# Patient Record
Sex: Male | Born: 1950
Health system: Southern US, Community
[De-identification: ages and names within clinical notes are randomized; demographics above are authoritative.]

## PROBLEM LIST (undated history)

## (undated) DIAGNOSIS — I890 Lymphedema, not elsewhere classified: Secondary | ICD-10-CM

## (undated) DIAGNOSIS — I499 Cardiac arrhythmia, unspecified: Secondary | ICD-10-CM

## (undated) DIAGNOSIS — I639 Cerebral infarction, unspecified: Secondary | ICD-10-CM

## (undated) DIAGNOSIS — R918 Other nonspecific abnormal finding of lung field: Secondary | ICD-10-CM

## (undated) DIAGNOSIS — D497 Neoplasm of unspecified behavior of endocrine glands and other parts of nervous system: Secondary | ICD-10-CM

## (undated) DIAGNOSIS — K922 Gastrointestinal hemorrhage, unspecified: Secondary | ICD-10-CM

## (undated) DIAGNOSIS — Z8673 Personal history of transient ischemic attack (TIA), and cerebral infarction without residual deficits: Secondary | ICD-10-CM

## (undated) DIAGNOSIS — L03115 Cellulitis of right lower limb: Secondary | ICD-10-CM

## (undated) DIAGNOSIS — C801 Malignant (primary) neoplasm, unspecified: Secondary | ICD-10-CM

## (undated) DIAGNOSIS — I4891 Unspecified atrial fibrillation: Secondary | ICD-10-CM

## (undated) DIAGNOSIS — J449 Chronic obstructive pulmonary disease, unspecified: Secondary | ICD-10-CM

## (undated) DIAGNOSIS — G4733 Obstructive sleep apnea (adult) (pediatric): Secondary | ICD-10-CM

## (undated) HISTORY — PX: ORTHOPEDIC SURGERY: SHX850

## (undated) HISTORY — DX: Unspecified atrial fibrillation: I48.91

## (undated) HISTORY — PX: TONSILLECTOMY: SUR1361

## (undated) HISTORY — PX: INNER EAR SURGERY: SHX679

## (undated) HISTORY — DX: Lymphedema, not elsewhere classified: I89.0

## (undated) HISTORY — DX: Obstructive sleep apnea (adult) (pediatric): G47.33

## (undated) HISTORY — PX: KNEE SURGERY: SHX244

## (undated) HISTORY — DX: Personal history of transient ischemic attack (TIA), and cerebral infarction without residual deficits: Z86.73

---

## 1999-05-30 ENCOUNTER — Emergency Department (HOSPITAL_COMMUNITY): Admission: EM | Admit: 1999-05-30 | Discharge: 1999-05-30 | Payer: Self-pay | Admitting: Emergency Medicine

## 2004-02-26 ENCOUNTER — Encounter: Admission: RE | Admit: 2004-02-26 | Discharge: 2004-02-26 | Payer: Self-pay | Admitting: Family Medicine

## 2004-05-06 ENCOUNTER — Encounter: Admission: RE | Admit: 2004-05-06 | Discharge: 2004-05-06 | Payer: Self-pay | Admitting: Family Medicine

## 2004-05-13 ENCOUNTER — Encounter: Admission: RE | Admit: 2004-05-13 | Discharge: 2004-05-13 | Payer: Self-pay | Admitting: Family Medicine

## 2004-06-10 ENCOUNTER — Encounter: Admission: RE | Admit: 2004-06-10 | Discharge: 2004-06-10 | Payer: Self-pay | Admitting: Family Medicine

## 2004-06-17 ENCOUNTER — Encounter: Admission: RE | Admit: 2004-06-17 | Discharge: 2004-06-17 | Payer: Self-pay | Admitting: Interventional Radiology

## 2004-07-08 ENCOUNTER — Encounter: Admission: RE | Admit: 2004-07-08 | Discharge: 2004-07-08 | Payer: Self-pay | Admitting: Interventional Radiology

## 2004-11-09 ENCOUNTER — Encounter: Admission: RE | Admit: 2004-11-09 | Discharge: 2004-11-09 | Payer: Self-pay | Admitting: Family Medicine

## 2010-02-21 ENCOUNTER — Encounter: Payer: Self-pay | Admitting: Family Medicine

## 2013-04-04 ENCOUNTER — Inpatient Hospital Stay (HOSPITAL_COMMUNITY)
Admission: EM | Admit: 2013-04-04 | Discharge: 2013-04-08 | DRG: 603 | Disposition: A | Discharge status: Home / Self Care | Payer: Commercial Managed Care - PPO | Attending: Internal Medicine | Admitting: Internal Medicine

## 2013-04-04 ENCOUNTER — Encounter (HOSPITAL_COMMUNITY): Payer: Self-pay | Admitting: Emergency Medicine

## 2013-04-04 ENCOUNTER — Emergency Department (HOSPITAL_COMMUNITY): Payer: Commercial Managed Care - PPO

## 2013-04-04 DIAGNOSIS — F1010 Alcohol abuse, uncomplicated: Secondary | ICD-10-CM

## 2013-04-04 DIAGNOSIS — D696 Thrombocytopenia, unspecified: Secondary | ICD-10-CM | POA: Diagnosis present

## 2013-04-04 DIAGNOSIS — Z6841 Body Mass Index (BMI) 40.0 and over, adult: Secondary | ICD-10-CM

## 2013-04-04 DIAGNOSIS — I890 Lymphedema, not elsewhere classified: Secondary | ICD-10-CM | POA: Diagnosis present

## 2013-04-04 DIAGNOSIS — I872 Venous insufficiency (chronic) (peripheral): Secondary | ICD-10-CM | POA: Diagnosis present

## 2013-04-04 DIAGNOSIS — L02419 Cutaneous abscess of limb, unspecified: Principal | ICD-10-CM

## 2013-04-04 DIAGNOSIS — E669 Obesity, unspecified: Secondary | ICD-10-CM

## 2013-04-04 DIAGNOSIS — I8390 Asymptomatic varicose veins of unspecified lower extremity: Secondary | ICD-10-CM | POA: Diagnosis present

## 2013-04-04 DIAGNOSIS — Z87891 Personal history of nicotine dependence: Secondary | ICD-10-CM

## 2013-04-04 DIAGNOSIS — L03119 Cellulitis of unspecified part of limb: Principal | ICD-10-CM | POA: Insufficient documentation

## 2013-04-04 DIAGNOSIS — L03115 Cellulitis of right lower limb: Secondary | ICD-10-CM

## 2013-04-04 HISTORY — DX: Cellulitis of right lower limb: L03.115

## 2013-04-04 HISTORY — DX: Lymphedema, not elsewhere classified: I89.0

## 2013-04-04 LAB — HEPATIC FUNCTION PANEL
ALT: 23 U/L (ref 0–53)
AST: 24 U/L (ref 0–37)
Albumin: 3.3 g/dL — ABNORMAL LOW (ref 3.5–5.2)
Alkaline Phosphatase: 48 U/L (ref 39–117)
Bilirubin, Direct: 0.2 mg/dL (ref 0.0–0.3)
Indirect Bilirubin: 0.5 mg/dL (ref 0.3–0.9)
Total Bilirubin: 0.7 mg/dL (ref 0.3–1.2)
Total Protein: 7.5 g/dL (ref 6.0–8.3)

## 2013-04-04 LAB — BASIC METABOLIC PANEL
BUN: 12 mg/dL (ref 6–23)
CO2: 24 mEq/L (ref 19–32)
Calcium: 9 mg/dL (ref 8.4–10.5)
Chloride: 100 mEq/L (ref 96–112)
Creatinine, Ser: 0.89 mg/dL (ref 0.50–1.35)
GFR calc Af Amer: >90 mL/min (ref >90)
GFR calc non Af Amer: >90 mL/min (ref >90)
Glucose, Bld: 111 mg/dL — ABNORMAL HIGH (ref 70–99)
Potassium: 4.1 mEq/L (ref 3.7–5.3)
Sodium: 136 mEq/L — ABNORMAL LOW (ref 137–147)

## 2013-04-04 LAB — CBC WITH DIFFERENTIAL/PLATELET
Basophils Absolute: 0 10*3/uL (ref 0.0–0.1)
Basophils Relative: 0 % (ref 0–1)
Eosinophils Absolute: 0.1 10*3/uL (ref 0.0–0.7)
Eosinophils Relative: 1 % (ref 0–5)
HCT: 41.7 % (ref 39.0–52.0)
Hemoglobin: 14.3 g/dL (ref 13.0–17.0)
Lymphocytes Relative: 10 % — ABNORMAL LOW (ref 12–46)
Lymphs Abs: 0.9 10*3/uL (ref 0.7–4.0)
MCH: 32.6 pg (ref 26.0–34.0)
MCHC: 34.3 g/dL (ref 30.0–36.0)
MCV: 95 fL (ref 78.0–100.0)
Monocytes Absolute: 1.1 10*3/uL — ABNORMAL HIGH (ref 0.1–1.0)
Monocytes Relative: 12 % (ref 3–12)
Neutro Abs: 6.6 10*3/uL (ref 1.7–7.7)
Neutrophils Relative %: 76 % (ref 43–77)
Platelets: 143 10*3/uL — ABNORMAL LOW (ref 150–400)
RBC: 4.39 MIL/uL (ref 4.22–5.81)
RDW: 12.7 % (ref 11.5–15.5)
WBC: 8.7 10*3/uL (ref 4.0–10.5)

## 2013-04-04 MED ORDER — VANCOMYCIN HCL IN DEXTROSE 1-5 GM/200ML-% IV SOLN
1000.0000 mg | Freq: Once | INTRAVENOUS | Status: AC
  Administered 2013-04-04: 1000 mg via INTRAVENOUS
  Filled 2013-04-04: qty 200

## 2013-04-04 MED ORDER — HEPARIN SODIUM (PORCINE) 5000 UNIT/ML IJ SOLN
5000.0000 [IU] | Freq: Three times a day (TID) | INTRAMUSCULAR | Status: DC
  Administered 2013-04-04 – 2013-04-08 (×11): 5000 [IU] via SUBCUTANEOUS
  Filled 2013-04-04 (×11): qty 1

## 2013-04-04 MED ORDER — VANCOMYCIN HCL IN DEXTROSE 1-5 GM/200ML-% IV SOLN
INTRAVENOUS | Status: AC
  Filled 2013-04-04: qty 200

## 2013-04-04 MED ORDER — VANCOMYCIN HCL 10 G IV SOLR
1250.0000 mg | Freq: Two times a day (BID) | INTRAVENOUS | Status: DC
  Administered 2013-04-05 – 2013-04-08 (×7): 1250 mg via INTRAVENOUS
  Filled 2013-04-04 (×9): qty 1250

## 2013-04-04 NOTE — Progress Notes (Signed)
ANTIBIOTIC CONSULT NOTE - INITIAL  Pharmacy Consult for Vancomycin Indication: Cellulitis  No Known Allergies  Patient Measurements: Height: 6\' 1"  (185.4 cm) Weight: 310 lb (140.615 kg) IBW/kg (Calculated) : 79.9 Adjusted Body Weight:   Vital Signs: Temp: 97.2 F (36.2 C) (03/05 1950) Temp src: Oral (03/05 1950) BP: 115/62 mmHg (03/05 1950) Pulse Rate: 72 (03/05 1950) Intake/Output from previous day:   Intake/Output from this shift:    Labs:  Recent Labs  04/04/13 1653  WBC 8.7  HGB 14.3  PLT 143*  CREATININE 0.89   Estimated Creatinine Clearance: 128.5 ml/min (by C-G formula based on Cr of 0.89). No results found for this basename: VANCOTROUGH, VANCOPEAK, VANCORANDOM, GENTTROUGH, GENTPEAK, GENTRANDOM, TOBRATROUGH, TOBRAPEAK, TOBRARND, AMIKACINPEAK, AMIKACINTROU, AMIKACIN,  in the last 72 hours   Microbiology: No results found for this or any previous visit (from the past 720 hour(s)).  Medical History: History reviewed. No pertinent past medical history.  Medications:  Scheduled:  . heparin  5,000 Units Subcutaneous 3 times per day  . [START ON 04/05/2013] vancomycin  1,250 mg Intravenous Q12H  . vancomycin  1,000 mg Intravenous Once   Assessment: Cellulitis lower right leg Vancomycin 1 GM IV given in ED Calculated Normalized CrCl 88 ml/min  Goal of Therapy:  Vancomycin trough level 10-15 mcg/ml  Plan:  Vancomycin 1 GM IV x 1 dose additional tonight Vancomycin 1250 mg IV every 12 hours starting 9 AM tomorrow Vancomycin trough at steady state Labs per protocol  Abner Greenspan,  [place] 04/04/2013,7:55 PM

## 2013-04-04 NOTE — ED Provider Notes (Signed)
CSN: [id number]     Arrival date & time 04/04/13  1610 History   First MD Initiated Contact with Patient 04/04/13 1622     Chief Complaint  Patient presents with  . Cellulitis     (Consider location/radiation/quality/duration/timing/severity/associated sxs/prior Treatment) The history is provided by the patient.   patient states that yesterday he developed some pain and swelling in his right lower leg. He states that he saw his doctor, Dr. Woody Seller, who wanted to admit him to [facility]. Patient is wife states he would not be admitted there. He had had fevers a couple days ago and was treated for possible flu. He was reportedly very dehydrated. Family states they did not do a flu test but had x-rays and blood work. He has had problems with the veins in the leg on the right before. He states he has some chronic color changes and some swelling, but the redness that is there now is new. No chest pain or difficulty breathing. He's had some fatigue. He is reportedly had fevers also. He states that the hospital known he checked his leg and he was having no pain in his leg at that time.  History reviewed. No pertinent past medical history. Past Surgical History  Procedure Laterality Date  . Inner ear surgery    . Orthopedic surgery     No family history on file. History  Substance Use Topics  . Smoking status: Former Research scientist (life sciences)  . Smokeless tobacco: Not on file  . Alcohol Use: Yes     Comment: 4-5 beer daily    Review of Systems  Constitutional: Positive for fever and fatigue. Negative for activity change and appetite change.  Eyes: Negative for pain.  Respiratory: Negative for chest tightness and shortness of breath.   Cardiovascular: Negative for chest pain and leg swelling.  Gastrointestinal: Negative for nausea, vomiting, abdominal pain and diarrhea.  Genitourinary: Negative for flank pain.  Musculoskeletal: Negative for back pain and neck stiffness.  Skin: Positive for color change  and wound. Negative for rash.  Neurological: Negative for weakness, numbness and headaches.  Psychiatric/Behavioral: Negative for behavioral problems.      Allergies  Review of patient's allergies indicates no known allergies.  Home Medications  No current outpatient prescriptions on file. BP 115/62  Pulse 72  Temp(Src) 97.2 F (36.2 C) (Oral)  Resp 20  Ht 6\' 1"  (1.854 m)  Wt 310 lb (140.615 kg)  BMI 40.91 kg/m2  SpO2 97% Physical Exam  Constitutional: He is oriented to person, place, and time. He appears well-developed and well-nourished.  Patient is obese  Neck: Neck supple.  Cardiovascular: Normal rate and regular rhythm.   Pulmonary/Chest: Effort normal and breath sounds normal.  Abdominal: There is no tenderness.  Musculoskeletal: He exhibits edema.  Color change to right lower leg with erythema. There is some petechiae. There is some induration of the skin, particularly prominent posteriorly and medially. There are chronic venous changes and a deeper purple color near the ankle. Sensation is intact grossly over the foot. Dorsalis pedis pulses intact. There is some edema to both lower legs.  Neurological: He is alert and oriented to person, place, and time.  Skin: Skin is warm.    ED Course  Procedures (including critical care time) Labs Review Labs Reviewed  CBC WITH DIFFERENTIAL - Abnormal; Notable for the following:    Platelets 143 (*)    Lymphocytes Relative 10 (*)    Monocytes Absolute 1.1 (*)    All other components  within normal limits  BASIC METABOLIC PANEL - Abnormal; Notable for the following:    Sodium 136 (*)    Glucose, Bld 111 (*)    All other components within normal limits  HEPATIC FUNCTION PANEL - Abnormal; Notable for the following:    Albumin 3.3 (*)    All other components within normal limits  CULTURE, BLOOD (ROUTINE X 2)  CULTURE, BLOOD (ROUTINE X 2)  COMPREHENSIVE METABOLIC PANEL  CBC   Imaging Review US Venous Img Lower Unilateral  Right  04/04/2013   CLINICAL DATA:  Right leg pain and redness.  EXAM: LOWER EXTREMITY VENOUS DOPPLER ULTRASOUND  TECHNIQUE: Gray-scale sonography with graded compression, as well as color Doppler and duplex ultrasound, were performed to evaluate the deep venous system from the level of the common femoral vein through the popliteal and proximal calf veins. Spectral Doppler was utilized to evaluate flow at rest and with distal augmentation maneuvers.  COMPARISON:  None.  FINDINGS: Thrombus within deep veins:  None visualized.  Compressibility of deep veins:  Normal.  Duplex waveform respiratory phasicity:  Normal.  Duplex waveform response to augmentation:  Normal.  Venous reflux:  None visualized.  Other findings: Enlarged lymph nodes within the right groin appear benign.  IMPRESSION: 1. No evidence for deep venous thrombosis. 2. Enlarged lymph nodes within the right groin appear benign.   Electronically Signed   By: Lawrence Santiago M.D.   On: 04/04/2013 18:35     EKG Interpretation None      MDM   Final diagnoses:  Cellulitis of right lower leg    Patient with likely cellulitis of right lower leg. Ultrasound does not show a DVT. Will start IV antibiotics and admitted to internal medicine. Has reportedly had fevers last few days but the leg swelling did not start until now.    Raymond Hurst. Alvino Chapel, MD 04/04/13 2118

## 2013-04-04 NOTE — ED Notes (Signed)
Pt was seen in PMD office today and dx with cellulitis. Pt stated that PMD was wanting to admit pt to [facility] refuses to be admitted there. Pt did have a shot of rocephin today while in the office. Fever earlier. Right lower leg is very red and swollen.

## 2013-04-04 NOTE — H&P (Addendum)
[facility] History and Physical  SEBERT STOLLINGS HQP:[id number] DOB: 1950/02/21 DOA: 04/04/2013  Referring physician:  Dr. Alvino Chapel, ER. PCP: Abigail Miyamoto, MD   Chief Complaint: Right leg redness. Fever.  HPI: Raymond Hurst is a 63 y.o. male  This man presents with 3 to four-day history of fever and today redness in the right lower leg. He had been seen at [facility] where it was felt that he might have had influenza. He was therefore treated symptomatically. At this time, he did not have any redness in his right lower leg. He does have a history of chronic venous insufficiency and a history of ulceration in the right ankle area. He denies any trauma to the right leg recently. He is nondiabetic.   Review of Systems:  Constitutional:  No weight loss, night sweats, Fevers, chills, fatigue.  HEENT:  No headaches, Difficulty swallowing,Tooth/dental problems,Sore throat,  No sneezing, itching, ear ache, nasal congestion, post nasal drip,  Cardio-vascular:  No chest pain, Orthopnea, PND, swelling in lower extremities, anasarca, dizziness, palpitations  GI:  No heartburn, indigestion, abdominal pain, nausea, vomiting, diarrhea, change in bowel habits, loss of appetite  Resp:  No shortness of breath with exertion or at rest. No excess mucus, no productive cough, No non-productive cough, No coughing up of blood.No change in color of mucus.No wheezing.No chest wall deformity    GU:  no dysuria, change in color of urine, no urgency or frequency. No flank pain.  Musculoskeletal:  No joint pain or swelling. No decreased range of motion. No back pain.  Psych:  No change in mood or affect. No depression or anxiety. No memory loss.   History reviewed. No pertinent past medical history. Past Surgical History  Procedure Laterality Date  . Inner ear surgery    . Orthopedic surgery     Social History:  reports that he has quit smoking. He does not have any smokeless tobacco  history on file. He reports that he drinks alcohol. He reports that he does not use illicit drugs. he drinks 4-5 beers every day.  No Known Allergies  No family history on file.   Prior to Admission medications   Not on File   Physical Exam: Filed Vitals:   04/04/13 1615  BP: 112/62  Pulse: 83  Temp: 97.4 F (36.3 C)  Resp: 18    BP 112/62  Pulse 83  Temp(Src) 97.4 F (36.3 C) (Oral)  Resp 18  Ht 6\' 1"  (1.854 m)  Wt 140.615 kg (310 lb)  BMI 40.91 kg/m2  SpO2 98%  General:  Appears calm and comfortable. He looks systemically well. He is nontoxic. Eyes: PERRL, normal lids, irises & conjunctiva ENT: grossly normal hearing, lips & tongue Neck: no LAD, masses or thyromegaly Cardiovascular: RRR, no m/r/g. No LE edema. Telemetry: SR, no arrhythmias  Respiratory: CTA bilaterally, no w/r/r. Normal respiratory effort. Abdomen: soft, ntnd Skin: Extensive cellulitis in the right lower leg from just below the knee downwards. Musculoskeletal: grossly normal tone BUE/BLE Psychiatric: grossly normal mood and affect, speech fluent and appropriate Neurologic: grossly non-focal.          Labs on Admission:  Basic Metabolic Panel:  Recent Labs Lab 04/04/13 1653  NA 136*  K 4.1  CL 100  CO2 24  GLUCOSE 111*  BUN 12  CREATININE 0.89  CALCIUM 9.0   Liver Function Tests:  Recent Labs Lab 04/04/13 1653  AST 24  ALT 23  ALKPHOS 48  BILITOT 0.7  PROT  7.5  ALBUMIN 3.3*   No results found for this basename: LIPASE, AMYLASE,  in the last 168 hours No results found for this basename: AMMONIA,  in the last 168 hours CBC:  Recent Labs Lab 04/04/13 1653  WBC 8.7  NEUTROABS 6.6  HGB 14.3  HCT 41.7  MCV 95.0  PLT 143*   Cardiac Enzymes: No results found for this basename: CKTOTAL, CKMB, CKMBINDEX, TROPONINI,  in the last 168 hours  BNP (last 3 results) No results found for this basename: PROBNP,  in the last 8760 hours CBG: No results found for this basename:  GLUCAP,  in the last 168 hours  Radiological Exams on Admission: US Venous Img Lower Unilateral Right  04/04/2013   CLINICAL DATA:  Right leg pain and redness.  EXAM: LOWER EXTREMITY VENOUS DOPPLER ULTRASOUND  TECHNIQUE: Gray-scale sonography with graded compression, as well as color Doppler and duplex ultrasound, were performed to evaluate the deep venous system from the level of the common femoral vein through the popliteal and proximal calf veins. Spectral Doppler was utilized to evaluate flow at rest and with distal augmentation maneuvers.  COMPARISON:  None.  FINDINGS: Thrombus within deep veins:  None visualized.  Compressibility of deep veins:  Normal.  Duplex waveform respiratory phasicity:  Normal.  Duplex waveform response to augmentation:  Normal.  Venous reflux:  None visualized.  Other findings: Enlarged lymph nodes within the right groin appear benign.  IMPRESSION: 1. No evidence for deep venous thrombosis. 2. Enlarged lymph nodes within the right groin appear benign.   Electronically Signed   By: Lawrence Santiago M.D.   On: 04/04/2013 18:35      Assessment/Plan   1. Cellulitis of the right lower leg. 2. Obesity. 3. Alcohol abuse.  Plan: 1. Admit to medical floor. 2. Intravenous vancomycin. 3. Monitor for alcohol withdrawal. Further recommendations will depend on clinical status. Monitor renal function closely.  Code Status: Full code.   Family Communication: Discussed plan with patient at the bedside.  Disposition Plan: Home when medically stable.   Time spent: 40 minutes.  Doree Albee [facility]

## 2013-04-05 LAB — CBC
HCT: 42.8 % (ref 39.0–52.0)
Hemoglobin: 15 g/dL (ref 13.0–17.0)
MCH: 33.3 pg (ref 26.0–34.0)
MCHC: 35 g/dL (ref 30.0–36.0)
MCV: 95.1 fL (ref 78.0–100.0)
Platelets: 155 10*3/uL (ref 150–400)
RBC: 4.5 MIL/uL (ref 4.22–5.81)
RDW: 12.9 % (ref 11.5–15.5)
WBC: 7.7 10*3/uL (ref 4.0–10.5)

## 2013-04-05 LAB — COMPREHENSIVE METABOLIC PANEL
ALBUMIN: 3 g/dL — AB (ref 3.5–5.2)
ALT: 24 U/L (ref 0–53)
AST: 22 U/L (ref 0–37)
Alkaline Phosphatase: 48 U/L (ref 39–117)
BUN: 10 mg/dL (ref 6–23)
CHLORIDE: 103 meq/L (ref 96–112)
CO2: 24 mEq/L (ref 19–32)
CREATININE: 0.96 mg/dL (ref 0.50–1.35)
Calcium: 8.8 mg/dL (ref 8.4–10.5)
GFR calc Af Amer: >90 mL/min (ref >90)
GFR, EST NON AFRICAN AMERICAN: 88 mL/min — AB (ref >90)
Glucose, Bld: 91 mg/dL (ref 70–99)
Potassium: 3.7 mEq/L (ref 3.7–5.3)
Sodium: 139 mEq/L (ref 137–147)
Total Bilirubin: 0.6 mg/dL (ref 0.3–1.2)
Total Protein: 7 g/dL (ref 6.0–8.3)

## 2013-04-05 MED ORDER — LORAZEPAM 2 MG/ML IJ SOLN: 1.0000 mg | INTRAMUSCULAR | Status: DC | PRN

## 2013-04-05 MED ORDER — SODIUM CHLORIDE 0.9 % IJ SOLN
3.0000 mL | Freq: Two times a day (BID) | INTRAMUSCULAR | Status: DC
  Administered 2013-04-05 – 2013-04-08 (×6): 3 mL via INTRAVENOUS

## 2013-04-05 MED ORDER — VITAMIN B-1 100 MG PO TABS
100.0000 mg | ORAL_TABLET | Freq: Every day | ORAL | Status: DC
  Administered 2013-04-05 – 2013-04-08 (×4): 100 mg via ORAL
  Filled 2013-04-05 (×5): qty 1

## 2013-04-05 MED ORDER — TAB-A-VITE/IRON PO TABS
1.0000 | ORAL_TABLET | Freq: Every day | ORAL | Status: DC
  Administered 2013-04-05 – 2013-04-08 (×4): 1 via ORAL
  Filled 2013-04-05 (×6): qty 1

## 2013-04-05 MED ORDER — SODIUM CHLORIDE 0.9 % IJ SOLN: 3.0000 mL | INTRAMUSCULAR | Status: DC | PRN

## 2013-04-05 MED ORDER — SODIUM CHLORIDE 0.9 % IV SOLN
250.0000 mL | INTRAVENOUS | Status: DC | PRN
  Administered 2013-04-06: 250 mL via INTRAVENOUS

## 2013-04-05 NOTE — Progress Notes (Signed)
UR chart review completed.  

## 2013-04-05 NOTE — Progress Notes (Signed)
[facility] PROGRESS NOTE  DEVYON KEATOR FUX:[id number] DOB: [birth year] DOA: 04/04/2013 PCP: Abigail Miyamoto, MD    Code Status: Full code Family Communication: Family not available Disposition Plan: Anticipate discharge to home when clinically appropriate   Consultants:  None  Procedures:  None  Antibiotics:  Vancomycin 04/04/13  HPI/Subjective: The patient has no complaints other than mild soreness of his right leg. He denies tremulousness, chest pain, or shortness of breath.  Objective: Filed Vitals:   04/05/13 0512  BP: 109/53  Pulse: 64  Temp: 98.9 F (37.2 C)  Resp: 20    Intake/Output Summary (Last 24 hours) at 04/05/13 0933 Last data filed at 04/05/13 0500  Gross per 24 hour  Intake   1180 ml  Output   1200 ml  Net    -20 ml   Filed Weights   04/04/13 1615  Weight: 140.615 kg (310 lb)    Exam:   General:  Large framed 63 year old Caucasian man sitting up in bed, in no acute distress.  Cardiovascular: S1, S2, with no murmurs rubs or gallops.  Respiratory: Clear to auscultation bilaterally.  Abdomen: Positive bowel sounds, soft, nontender, nondistended.  Musculoskeletal/extremities: There is diffuse intense erythema of his right leg starting at the lowest knee extending to the dorsum of his foot. Trace to 1+ nonpitting edema. Mild diffuse tenderness. Pedal pulses palpable.  Neurologic/psychiatric: He is alert and oriented x3. His speech is clear. Cranial nerves II through XII are intact. No signs of tremulousness.  Data Reviewed: Basic Metabolic Panel:  Recent Labs Lab 04/04/13 1653 04/05/13 0459  NA 136* 139  K 4.1 3.7  CL 100 103  CO2 24 24  GLUCOSE 111* 91  BUN 12 10  CREATININE 0.89 0.96  CALCIUM 9.0 8.8   Liver Function Tests:  Recent Labs Lab 04/04/13 1653 04/05/13 0459  AST 24 22  ALT 23 24  ALKPHOS 48 48  BILITOT 0.7 0.6  PROT 7.5 7.0  ALBUMIN 3.3* 3.0*   No results found for this basename: LIPASE, AMYLASE,   in the last 168 hours No results found for this basename: AMMONIA,  in the last 168 hours CBC:  Recent Labs Lab 04/04/13 1653 04/05/13 0459  WBC 8.7 7.7  NEUTROABS 6.6  --   HGB 14.3 15.0  HCT 41.7 42.8  MCV 95.0 95.1  PLT 143* 155   Cardiac Enzymes: No results found for this basename: CKTOTAL, CKMB, CKMBINDEX, TROPONINI,  in the last 168 hours BNP (last 3 results) No results found for this basename: PROBNP,  in the last 8760 hours CBG: No results found for this basename: GLUCAP,  in the last 168 hours  Recent Results (from the past 240 hour(s))  CULTURE, BLOOD (ROUTINE X 2)     Status: None   Collection Time    04/04/13  4:53 PM      Result Value Ref Range Status   Specimen Description BLOOD RIGHT ANTECUBITAL   Final   Special Requests     Final   Value: BOTTLES DRAWN AEROBIC AND ANAEROBIC AEB=10CC ANA=8CC   Culture NO GROWTH 1 DAY   Final   Report Status PENDING   Incomplete  CULTURE, BLOOD (ROUTINE X 2)     Status: None   Collection Time    04/04/13  4:59 PM      Result Value Ref Range Status   Specimen Description BLOOD LEFT ANTECUBITAL   Final   Special Requests BOTTLES DRAWN AEROBIC AND ANAEROBIC [place]   Final  Culture NO GROWTH 1 DAY   Final   Report Status PENDING   Incomplete     Studies: US Venous Img Lower Unilateral Right  04/04/2013   CLINICAL DATA:  Right leg pain and redness.  EXAM: LOWER EXTREMITY VENOUS DOPPLER ULTRASOUND  TECHNIQUE: Gray-scale sonography with graded compression, as well as color Doppler and duplex ultrasound, were performed to evaluate the deep venous system from the level of the common femoral vein through the popliteal and proximal calf veins. Spectral Doppler was utilized to evaluate flow at rest and with distal augmentation maneuvers.  COMPARISON:  None.  FINDINGS: Thrombus within deep veins:  None visualized.  Compressibility of deep veins:  Normal.  Duplex waveform respiratory phasicity:  Normal.  Duplex waveform response to  augmentation:  Normal.  Venous reflux:  None visualized.  Other findings: Enlarged lymph nodes within the right groin appear benign.  IMPRESSION: 1. No evidence for deep venous thrombosis. 2. Enlarged lymph nodes within the right groin appear benign.   Electronically Signed   By: Lawrence Santiago M.D.   On: 04/04/2013 18:35    Scheduled Meds: . heparin  5,000 Units Subcutaneous 3 times per day  . vancomycin  1,250 mg Intravenous Q12H   Continuous Infusions:   Assessment:  Active Problems:   Cellulitis of right lower leg   Obesity, unspecified   Alcohol abuse   1. Cellulitis of the right lower extremity. Right lower extremity venous ultrasound negative for DVT. He denies any recent trauma, but has a remote history of ankle trauma approximately 30 years ago. He has prominent varicose veins on both legs which he has had for most of his life. We'll continue vancomycin as ordered. We'll continue supportive treatment. We'll order elevation of leg while at rest.  Alcohol abuse. The patient denies alcohol dependency or alcoholism. He denies any history of alcohol withdrawal syndrome or DTs. He has gone several days without drinking beer, but not lately. Currently, he has no signs of alcohol withdrawal. We'll add when necessary Ativan and vitamin therapy.  Mild thrombocytopenia on admission. Now resolved.    Plan: 1. The patient was advised to decrease his beer intake to one to 2 a day. We'll add when necessary Ativan and multivitamin and thiamine. 2. Elevate right leg when at rest.   Time spent: 35 minutes.    [facility] Pager [phone]. If 7PM-7AM, please contact night-coverage at [email or web address], password [facility] 04/05/2013, 9:33 AM  LOS: 1 day

## 2013-04-05 NOTE — Care Management Note (Addendum)
    Page 1 of 1   04/08/2013     11:09:18 AM   CARE MANAGEMENT NOTE 04/08/2013  Patient:  Raymond Hurst, Raymond Hurst   Account Number:  [id number]  Date Initiated:  04/05/2013  Documentation initiated by:  Theophilus Kinds  Subjective/Objective Assessment:   Pt admitted from home with cellulitis of right leg. Pt lives with his wife and will return home at discharge. Pt is independent with ADL's.     Action/Plan:   No CM needs noted.   Anticipated DC Date:  04/08/2013   Anticipated DC Plan:  [place]  CM consult      Choice offered to / List presented to:             Status of service:  Completed, signed off Medicare Important Message given?   (If response is "NO", the following Medicare IM given date fields will be blank) Date Medicare IM given:   Date Additional Medicare IM given:    Discharge Disposition:  HOME/SELF CARE  Per UR Regulation:    If discussed at Long Length of Stay Meetings, dates discussed:    Comments:  04/08/13 [place], RN BSN CM Pt discharged home today. No CM needs noted. Bedside RN arranged followup for outpt lymphedema clinic at [facility] dept and appt time given to pt.  04/05/13 [place], RN BSN CM

## 2013-04-06 ENCOUNTER — Inpatient Hospital Stay (HOSPITAL_COMMUNITY): Payer: Commercial Managed Care - PPO

## 2013-04-06 NOTE — Progress Notes (Signed)
[facility] PROGRESS NOTE  Raymond Hurst BOF:[id number] DOB: September 20, 1950 DOA: 04/04/2013 PCP: Abigail Miyamoto, MD    Code Status: Full code Family Communication: Family not available Disposition Plan: Anticipate discharge to home when clinically appropriate   Consultants:  None  Procedures:  None  Antibiotics:  Vancomycin 04/04/13  HPI/Subjective: The patient has no complaints other than mild soreness of his right leg. He will occasionally have sharp shooting right ankle pain when he ambulates. He had trauma to his ankle approximately 30 years ago. He is requesting an x-ray of his ankle. He denies tremulousness, chest pain, or shortness of breath.   Objective: Filed Vitals:   04/06/13 0445  BP: 120/70  Pulse: 68  Temp: 97.6 F (36.4 C)  Resp: 20   oxygen saturation 98% on room air.  Intake/Output Summary (Last 24 hours) at 04/06/13 1230 Last data filed at 04/06/13 1229  Gross per 24 hour  Intake   1483 ml  Output    700 ml  Net    783 ml   Filed Weights   04/04/13 1615  Weight: 140.615 kg (310 lb)    Exam:   General:  Large framed 63 year old Caucasian man sitting up in bed, in no acute distress.  Cardiovascular: S1, S2, with no murmurs rubs or gallops.  Respiratory: Clear to auscultation bilaterally.  Abdomen: Positive bowel sounds, soft, nontender, nondistended.  Musculoskeletal/extremities: There is diffuse intense erythema of his right leg starting at the lowest knee extending to the dorsum of his foot; compared to yesterday, there are some areas of clearing. Trace to 1+ nonpitting edema. Distal right lower extremity stasis changes with hemosiderin darkening of the skin and mild stage I ulceration on the medial malleolus. Pedal pulses palpable.  Neurologic/psychiatric: He is alert and oriented x3. His speech is clear. Cranial nerves II through XII are intact. No signs of tremulousness.  Data Reviewed: Basic Metabolic Panel:  Recent Labs Lab  04/04/13 1653 04/05/13 0459  NA 136* 139  K 4.1 3.7  CL 100 103  CO2 24 24  GLUCOSE 111* 91  BUN 12 10  CREATININE 0.89 0.96  CALCIUM 9.0 8.8   Liver Function Tests:  Recent Labs Lab 04/04/13 1653 04/05/13 0459  AST 24 22  ALT 23 24  ALKPHOS 48 48  BILITOT 0.7 0.6  PROT 7.5 7.0  ALBUMIN 3.3* 3.0*   No results found for this basename: LIPASE, AMYLASE,  in the last 168 hours No results found for this basename: AMMONIA,  in the last 168 hours CBC:  Recent Labs Lab 04/04/13 1653 04/05/13 0459  WBC 8.7 7.7  NEUTROABS 6.6  --   HGB 14.3 15.0  HCT 41.7 42.8  MCV 95.0 95.1  PLT 143* 155   Cardiac Enzymes: No results found for this basename: CKTOTAL, CKMB, CKMBINDEX, TROPONINI,  in the last 168 hours BNP (last 3 results) No results found for this basename: PROBNP,  in the last 8760 hours CBG: No results found for this basename: GLUCAP,  in the last 168 hours  Recent Results (from the past 240 hour(s))  CULTURE, BLOOD (ROUTINE X 2)     Status: None   Collection Time    04/04/13  4:53 PM      Result Value Ref Range Status   Specimen Description BLOOD RIGHT ANTECUBITAL   Final   Special Requests     Final   Value: BOTTLES DRAWN AEROBIC AND ANAEROBIC AEB=10CC ANA=8CC   Culture NO GROWTH 2 DAYS  Final   Report Status PENDING   Incomplete  CULTURE, BLOOD (ROUTINE X 2)     Status: None   Collection Time    04/04/13  4:59 PM      Result Value Ref Range Status   Specimen Description BLOOD LEFT ANTECUBITAL   Final   Special Requests BOTTLES DRAWN AEROBIC AND ANAEROBIC 8CC   Final   Culture NO GROWTH 2 DAYS   Final   Report Status PENDING   Incomplete     Studies: US Venous Img Lower Unilateral Right  04/04/2013   CLINICAL DATA:  Right leg pain and redness.  EXAM: LOWER EXTREMITY VENOUS DOPPLER ULTRASOUND  TECHNIQUE: Gray-scale sonography with graded compression, as well as color Doppler and duplex ultrasound, were performed to evaluate the deep venous system from the  level of the common femoral vein through the popliteal and proximal calf veins. Spectral Doppler was utilized to evaluate flow at rest and with distal augmentation maneuvers.  COMPARISON:  None.  FINDINGS: Thrombus within deep veins:  None visualized.  Compressibility of deep veins:  Normal.  Duplex waveform respiratory phasicity:  Normal.  Duplex waveform response to augmentation:  Normal.  Venous reflux:  None visualized.  Other findings: Enlarged lymph nodes within the right groin appear benign.  IMPRESSION: 1. No evidence for deep venous thrombosis. 2. Enlarged lymph nodes within the right groin appear benign.   Electronically Signed   By: Lawrence Santiago M.D.   On: 04/04/2013 18:35    Scheduled Meds: . heparin  5,000 Units Subcutaneous 3 times per day  . multivitamins with iron  1 tablet Oral Daily  . sodium chloride  3 mL Intravenous Q12H  . thiamine  100 mg Oral Daily  . vancomycin  1,250 mg Intravenous Q12H   Continuous Infusions:   Assessment:  Active Problems:   Cellulitis of right lower leg   Obesity, unspecified   Alcohol abuse   1. Cellulitis of the right lower extremity. Right lower extremity venous ultrasound negative for DVT. He denies any recent trauma, but has a remote history of ankle trauma approximately 30 years ago. He has prominent varicose veins on both legs which he has had for most of his life. We'll continue vancomycin as ordered. We'll continue supportive treatment. We'll order elevation of leg while at rest. We'll order an x-ray of his right ankle to rule out any acute abnormalities.  Alcohol abuse. The patient denies alcohol dependency or alcoholism. He denies any history of alcohol withdrawal syndrome or DTs. He has gone several days without drinking beer, but not lately. Currently, he has no signs of alcohol withdrawal. We'll add when necessary Ativan and vitamin therapy.  Mild thrombocytopenia on admission. Now resolved.    Plan: 1. As above in history  present illness. 2. Right ankle x-ray results pending.   Time spent: 35 minutes.    [facility] Pager [phone]. If 7PM-7AM, please contact night-coverage at [email or web address], password [facility] 04/06/2013, 12:30 PM  LOS: 2 days

## 2013-04-07 LAB — CBC
HEMATOCRIT: 42.6 % (ref 39.0–52.0)
HEMOGLOBIN: 14.6 g/dL (ref 13.0–17.0)
MCH: 32.5 pg (ref 26.0–34.0)
MCHC: 34.3 g/dL (ref 30.0–36.0)
MCV: 94.9 fL (ref 78.0–100.0)
Platelets: 186 10*3/uL (ref 150–400)
RBC: 4.49 MIL/uL (ref 4.22–5.81)
RDW: 12.2 % (ref 11.5–15.5)
WBC: 7.4 10*3/uL (ref 4.0–10.5)

## 2013-04-07 LAB — BASIC METABOLIC PANEL
BUN: 12 mg/dL (ref 6–23)
CO2: 25 meq/L (ref 19–32)
Calcium: 9.1 mg/dL (ref 8.4–10.5)
Chloride: 105 mEq/L (ref 96–112)
Creatinine, Ser: 0.83 mg/dL (ref 0.50–1.35)
GFR calc Af Amer: >90 mL/min (ref >90)
GFR calc non Af Amer: >90 mL/min (ref >90)
Glucose, Bld: 96 mg/dL (ref 70–99)
POTASSIUM: 4 meq/L (ref 3.7–5.3)
SODIUM: 139 meq/L (ref 137–147)

## 2013-04-07 NOTE — Progress Notes (Signed)
[facility] PROGRESS NOTE  Raymond Hurst LYY:[id number] DOB: [birth year] DOA: 04/04/2013 PCP: Abigail Miyamoto, MD    Code Status: Full code Family Communication: Family not available Disposition Plan: Anticipate discharge to home when clinically appropriate   Consultants:  None  Procedures:  None  Antibiotics:  Vancomycin 04/04/13  HPI/Subjective: The patient has no complaints.  Objective: Filed Vitals:   04/07/13 0501  BP: 120/76  Pulse: 60  Temp: 98.8 F (37.1 C)  Resp: 20   oxygen saturation 97% on room air.  Intake/Output Summary (Last 24 hours) at 04/07/13 1316 Last data filed at 04/07/13 1012  Gross per 24 hour  Intake    723 ml  Output    300 ml  Net    423 ml   Filed Weights   04/04/13 1615  Weight: 140.615 kg (310 lb)    Exam:   General:  Large framed 63 year old Caucasian man sitting up in bed, in no acute distress.  Cardiovascular: S1, S2, with no murmurs rubs or gallops.  Respiratory: Clear to auscultation bilaterally.  Abdomen: Positive bowel sounds, soft, nontender, nondistended.  Musculoskeletal/extremities: There is significant clearing or the anterior surface, but still some residual circumferential intense erythema of his right leg starting at the lowest knee extending to the dorsum of his foot. Trace to 1+ nonpitting edema. Distal right lower extremity stasis changes with hemosiderin darkening of the skin and mild stage I ulceration on the medial malleolus. Pedal pulses palpable.  Neurologic/psychiatric: He is alert and oriented x3. His speech is clear. Cranial nerves II through XII are intact. No signs of tremulousness.  Data Reviewed: Basic Metabolic Panel:  Recent Labs Lab 04/04/13 1653 04/05/13 0459 04/07/13 0605  NA 136* 139 139  K 4.1 3.7 4.0  CL 100 103 105  CO2 24 24 25   GLUCOSE 111* 91 96  BUN 12 10 12   CREATININE 0.89 0.96 0.83  CALCIUM 9.0 8.8 9.1   Liver Function Tests:  Recent Labs Lab 04/04/13 1653  04/05/13 0459  AST 24 22  ALT 23 24  ALKPHOS 48 48  BILITOT 0.7 0.6  PROT 7.5 7.0  ALBUMIN 3.3* 3.0*   No results found for this basename: LIPASE, AMYLASE,  in the last 168 hours No results found for this basename: AMMONIA,  in the last 168 hours CBC:  Recent Labs Lab 04/04/13 1653 04/05/13 0459 04/07/13 0605  WBC 8.7 7.7 7.4  NEUTROABS 6.6  --   --   HGB 14.3 15.0 14.6  HCT 41.7 42.8 42.6  MCV 95.0 95.1 94.9  PLT 143* 155 186   Cardiac Enzymes: No results found for this basename: CKTOTAL, CKMB, CKMBINDEX, TROPONINI,  in the last 168 hours BNP (last 3 results) No results found for this basename: PROBNP,  in the last 8760 hours CBG: No results found for this basename: GLUCAP,  in the last 168 hours  Recent Results (from the past 240 hour(s))  CULTURE, BLOOD (ROUTINE X 2)     Status: None   Collection Time    04/04/13  4:53 PM      Result Value Ref Range Status   Specimen Description BLOOD RIGHT ANTECUBITAL   Final   Special Requests     Final   Value: BOTTLES DRAWN AEROBIC AND ANAEROBIC AEB=10CC ANA=8CC   Culture NO GROWTH 3 DAYS   Final   Report Status PENDING   Incomplete  CULTURE, BLOOD (ROUTINE X 2)     Status: None   Collection Time  04/04/13  4:59 PM      Result Value Ref Range Status   Specimen Description BLOOD LEFT ANTECUBITAL   Final   Special Requests BOTTLES DRAWN AEROBIC AND ANAEROBIC 8CC   Final   Culture NO GROWTH 3 DAYS   Final   Report Status PENDING   Incomplete     Studies: Dg Ankle Complete Right  04/06/2013   CLINICAL DATA:  Right leg cellulitis.  Right ankle injury.  EXAM: RIGHT ANKLE - COMPLETE 3+ VIEW  COMPARISON:  None.  FINDINGS: Diffuse soft tissue swelling. Degenerative changes in the right ankle with joint space narrowing and spurring. No fracture, subluxation or dislocation.  IMPRESSION: No acute bony abnormality.   Electronically Signed   By: Rolm Baptise M.D.   On: 04/06/2013 14:31    Scheduled Meds: . heparin  5,000 Units  Subcutaneous 3 times per day  . multivitamins with iron  1 tablet Oral Daily  . sodium chloride  3 mL Intravenous Q12H  . thiamine  100 mg Oral Daily  . vancomycin  1,250 mg Intravenous Q12H   Continuous Infusions:   Assessment:  Active Problems:   Cellulitis of right lower leg   Obesity, unspecified   Alcohol abuse   1. Cellulitis of the right lower extremity. Right lower extremity venous ultrasound negative for DVT. He denies any recent trauma, but has a remote history of ankle trauma approximately 30 years ago. X-ray of his right ankle revealed diffuse soft tissue swelling and degenerative changes, but no evidence of fracture or dislocation. He has prominent varicose veins on both legs which he has had for most of his life. Blood cultures negative to date. We'll continue vancomycin as ordered. We'll continue supportive treatment. We'll order elevation of leg while at rest.    Alcohol abuse. The patient denies alcohol dependency or alcoholism. He denies any history of alcohol withdrawal syndrome or DTs. He has gone several days without drinking beer, but not lately. Currently, he has no signs of alcohol withdrawal. We'll add when necessary Ativan and vitamin therapy.  Mild thrombocytopenia on admission. Now resolved.    Plan: 1. As above in history present illness. 2. We'll continue IV antibiotics for another 24 hours with anticipation of discharge to home on doxycycline for 7-10 more days.   Time spent: 20 minutes.    [facility] Pager [phone]. If 7PM-7AM, please contact night-coverage at [email or web address], password [facility] 04/07/2013, 1:16 PM  LOS: 3 days

## 2013-04-08 ENCOUNTER — Encounter (HOSPITAL_COMMUNITY): Payer: Self-pay | Admitting: Internal Medicine

## 2013-04-08 DIAGNOSIS — I890 Lymphedema, not elsewhere classified: Secondary | ICD-10-CM | POA: Diagnosis present

## 2013-04-08 MED ORDER — DOXYCYCLINE HYCLATE 100 MG PO TABS: 100.0000 mg | ORAL_TABLET | Freq: Two times a day (BID) | ORAL | Status: DC

## 2013-04-08 MED ORDER — DOXYCYCLINE HYCLATE 100 MG PO TABS
100.0000 mg | ORAL_TABLET | Freq: Two times a day (BID) | ORAL | Status: DC
  Administered 2013-04-08: 100 mg via ORAL
  Filled 2013-04-08: qty 1

## 2013-04-08 NOTE — Discharge Summary (Signed)
Physician Discharge Summary  NOA CONSTANTE SWF:[id number] DOB: [birth year] DOA: 04/04/2013  PCP: Abigail Miyamoto, MD  Admit date: 04/04/2013 Discharge date: 04/08/2013  Time spent: Greater than 30 minutes  Recommendations for Outpatient Follow-up:  1. The patient was instructed to stay out of work for 3-4 days, and then return to work or per the discretion of his primary care provider.  Discharge Diagnoses:  1. Right lower extremity cellulitis. 2. Probable right lower extremity lymphedema/venous insufficiency. 3. Remote history of right ankle trauma and vein stripping from right lower extremity. 4. Obesity. 5. Alcohol abuse with no signs of alcohol withdrawal syndrome.  Discharge Condition: Improved.  Diet recommendation: Heart healthy or regular as tolerated.  Filed Weights   04/04/13 1615  Weight: 140.615 kg (310 lb)    History of present illness:  The patient presented with a 3 to four-day history of fever and worsening redness in the right lower leg. He had been seen at [facility] where it was felt that he might have had influenza. He was therefore treated symptomatically. At that time, he did not have any redness in his right lower leg. He does have a history of chronic venous insufficiency and a history of ulceration of the right ankle area. He denied any trauma to the right leg recently. He is nondiabetic.   Hospital Course:   On admission, the patient was afebrile and hemodynamically stable. His white blood cell count was within normal limits. However on exam, he had diffuse intense erythema from his right knee down to the dorsum of his foot. He was started on gentle IV fluids, supportive treatment for pain, and IV vancomycin. Blood cultures were ordered in the emergency department. They remained without any growth during hospitalization. The supportive treatment, his leg was kept elevated. In my discussion with him, he had an injury to his right ankle approximately 30 years  ago. He also had to have a vein stripped from his right leg. Since that time, he has had problems with swelling and occasional ulcerations on his medial right ankle which he would treat with TED hose or compressive stockings. At times, he could not tolerate the compression stockings because they were too tight and caused extreme pain in his right leg. 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  The patient admitted drinking more than a sixpack a better day, but he denied alcohol dependence. He denied any history of alcohol withdrawal syndrome or DTs. Nevertheless, as needed lorazepam was ordered as well as thiamine and a multivitamin. Throughout the hospitalization, he had no development of alcohol withdrawal syndrome at all. He was quite pleasant throughout the hospitalization.  Throughout the hospitalization, the patient remained afebrile. His white blood cell count remained within normal limits. The erythema over his right leg subsided substantially, but it did not completely resolve. He continued to have some erythema around his calf muscles. Lower extremity venous ultrasound revealed no evidence of DVT. He also had chronic stasis dermatitis over his distal right leg and multiple varicosities of both legs. Given his history, he probably has chronic venous stasis insufficiency and superimposed lymphedema. At the time of discharge, he was referred to the lymphedema clinic at [facility]. He was appreciative of the referral. He was discharged on doxycycline for 7 more days after receiving IV vancomycin for 4 days.   Procedures:  None  Consultations:  None  Discharge Exam: Filed Vitals:   04/08/13 0604  BP: 128/86  Pulse: 73  Temp: 98.8 F (37.1 C)  Resp: 18    General: Large framed 63 year old Caucasian man sitting up in bed, in no acute distress.  Cardiovascular: S1, S2, with no murmurs rubs or gallops.  Respiratory: Clear to  auscultation bilaterally.  Abdomen: Positive bowel sounds, soft, nontender, nondistended.  Musculoskeletal/extremities: There is significant clearing or the anterior surface, but still some residual circumferential intense erythema of his right leg starting at the lowest knee extending to the dorsum of his foot. Trace to 1+ nonpitting edema. Distal right lower extremity stasis changes with hemosiderin darkening of the skin and mild stage I ulceration on the medial malleolus. Pedal pulses palpable.  Neurologic/psychiatric: He is alert and oriented x3. His speech is clear. Cranial nerves II through XII are intact. No signs of tremulousness.   Discharge Instructions  Discharge Orders   Future Appointments Provider Department Dept Phone   04/16/2013 1:00 PM Leeroy Cha, PT [place] [facility] [phone]   Future Orders Complete By Expires   Diet general  As directed    Discharge instructions  As directed    Comments:     Keep right leg elevated when sitting or laying.   Increase activity slowly  As directed        Medication List         doxycycline 100 MG tablet  Commonly known as:  VIBRA-TABS  Take 1 tablet (100 mg total) by mouth every 12 (twelve) hours. Antibiotic.       No Known Allergies     Follow-up Information   Follow up with lymphedema clinic On 04/16/2013. (appointment at 1pm)    Contact information:   [phone]      Follow up with FRIED, ROBERT L, MD In 1 week.   Specialty:  Family Medicine   Contact information:   [place] [address] [phone]        The results of significant diagnostics from this hospitalization (including imaging, microbiology, ancillary and laboratory) are listed below for reference.    Significant Diagnostic Studies: Dg Ankle Complete Right  04/06/2013   CLINICAL DATA:  Right leg cellulitis.  Right ankle injury.  EXAM: RIGHT ANKLE - COMPLETE 3+ VIEW  COMPARISON:  None.  FINDINGS: Diffuse soft  tissue swelling. Degenerative changes in the right ankle with joint space narrowing and spurring. No fracture, subluxation or dislocation.  IMPRESSION: No acute bony abnormality.   Electronically Signed   By: Rolm Baptise M.D.   On: 04/06/2013 14:31   US Venous Img Lower Unilateral Right  04/04/2013   CLINICAL DATA:  Right leg pain and redness.  EXAM: LOWER EXTREMITY VENOUS DOPPLER ULTRASOUND  TECHNIQUE: Gray-scale sonography with graded compression, as well as color Doppler and duplex ultrasound, were performed to evaluate the deep venous system from the level of the common femoral vein through the popliteal and proximal calf veins. Spectral Doppler was utilized to evaluate flow at rest and with distal augmentation maneuvers.  COMPARISON:  None.  FINDINGS: Thrombus within deep veins:  None visualized.  Compressibility of deep veins:  Normal.  Duplex waveform respiratory phasicity:  Normal.  Duplex waveform response to augmentation:  Normal.  Venous reflux:  None visualized.  Other findings: Enlarged lymph nodes within the right groin appear benign.  IMPRESSION: 1. No evidence for deep venous thrombosis. 2. Enlarged lymph nodes within the right groin appear benign.   Electronically Signed   By: Bretta Bang.D.  On: 04/04/2013 18:35    Microbiology: Recent Results (from the past 240 hour(s))  CULTURE, BLOOD (ROUTINE X 2)     Status: None   Collection Time    04/04/13  4:53 PM      Result Value Ref Range Status   Specimen Description BLOOD RIGHT ANTECUBITAL   Final   Special Requests     Final   Value: BOTTLES DRAWN AEROBIC AND ANAEROBIC AEB=10CC ANA=8CC   Culture NO GROWTH 4 DAYS   Final   Report Status PENDING   Incomplete  CULTURE, BLOOD (ROUTINE X 2)     Status: None   Collection Time    04/04/13  4:59 PM      Result Value Ref Range Status   Specimen Description BLOOD LEFT ANTECUBITAL   Final   Special Requests BOTTLES DRAWN AEROBIC AND ANAEROBIC 8CC   Final   Culture NO GROWTH 4 DAYS    Final   Report Status PENDING   Incomplete     Labs: Basic Metabolic Panel:  Recent Labs Lab 04/04/13 1653 04/05/13 0459 04/07/13 0605  NA 136* 139 139  K 4.1 3.7 4.0  CL 100 103 105  CO2 24 24 25   GLUCOSE 111* 91 96  BUN 12 10 12   CREATININE 0.89 0.96 0.83  CALCIUM 9.0 8.8 9.1   Liver Function Tests:  Recent Labs Lab 04/04/13 1653 04/05/13 0459  AST 24 22  ALT 23 24  ALKPHOS 48 48  BILITOT 0.7 0.6  PROT 7.5 7.0  ALBUMIN 3.3* 3.0*   No results found for this basename: LIPASE, AMYLASE,  in the last 168 hours No results found for this basename: AMMONIA,  in the last 168 hours CBC:  Recent Labs Lab 04/04/13 1653 04/05/13 0459 04/07/13 0605  WBC 8.7 7.7 7.4  NEUTROABS 6.6  --   --   HGB 14.3 15.0 14.6  HCT 41.7 42.8 42.6  MCV 95.0 95.1 94.9  PLT 143* 155 186   Cardiac Enzymes: No results found for this basename: CKTOTAL, CKMB, CKMBINDEX, TROPONINI,  in the last 168 hours BNP: BNP (last 3 results) No results found for this basename: PROBNP,  in the last 8760 hours CBG: No results found for this basename: GLUCAP,  in the last 168 hours     Signed:  ,  [facility] 04/08/2013, 10:50 AM

## 2013-04-08 NOTE — Progress Notes (Signed)
UR chart review completed.  

## 2013-04-08 NOTE — Consult Note (Signed)
[facility] consulted for LE edema and questionable lymphedema.  [facility] PT outpatient department offers the services of a certified lymphedema therapist. Would suggest referral for outpatient evaluation and treatment plan. Discussed patient current status with bedside nurse, no open wounds currently, no need for any topical wound care.  For this reason [facility] will not see patient, recommend outpatient referral as noted above.   [place] RN,CWOCN [phone]

## 2013-04-08 NOTE — Progress Notes (Signed)
Patient states understanding of discharge instructions, prescription given. 

## 2013-04-09 LAB — CULTURE, BLOOD (ROUTINE X 2)
Culture: NO GROWTH
Culture: NO GROWTH

## 2013-04-16 ENCOUNTER — Ambulatory Visit (HOSPITAL_COMMUNITY): Admit: 2013-04-16 | Payer: Commercial Managed Care - PPO | Admitting: Physical Therapy

## 2013-04-30 ENCOUNTER — Ambulatory Visit (HOSPITAL_COMMUNITY)
Admission: RE | Admit: 2013-04-30 | Discharge: 2013-04-30 | Disposition: A | Discharge status: Home / Self Care | Payer: Commercial Managed Care - PPO | Source: Ambulatory Visit | Attending: Internal Medicine | Admitting: Internal Medicine

## 2013-04-30 DIAGNOSIS — L02419 Cutaneous abscess of limb, unspecified: Secondary | ICD-10-CM | POA: Insufficient documentation

## 2013-04-30 DIAGNOSIS — L03119 Cellulitis of unspecified part of limb: Secondary | ICD-10-CM

## 2013-04-30 DIAGNOSIS — IMO0001 Reserved for inherently not codable concepts without codable children: Secondary | ICD-10-CM | POA: Insufficient documentation

## 2013-04-30 DIAGNOSIS — I890 Lymphedema, not elsewhere classified: Secondary | ICD-10-CM | POA: Insufficient documentation

## 2013-04-30 DIAGNOSIS — E669 Obesity, unspecified: Secondary | ICD-10-CM | POA: Insufficient documentation

## 2013-04-30 NOTE — Evaluation (Addendum)
Physical Therapy Evaluation  Patient Details  Name: Raymond Hurst MRN: [id number] Date of Birth: 1950/05/23  Today's Date: 04/30/2013 Time: 6789-3810 PT Time Calculation (min): 60 min Charge:  Evaluation 1515-1600 manual [id number]             Visit#: 1 of 8  Re-eval: 05/30/13 Assessment Diagnosis: Lymphedema secondary Prior Therapy: none  Authorization: [employer]    Past Medical History:  Past Medical History  Diagnosis Date  . Lymphedema of leg 04/08/2013  . Cellulitis of right lower leg 04/04/2013   Past Surgical History:  Past Surgical History  Procedure Laterality Date  . Inner ear surgery    . Orthopedic surgery      Subjective Symptoms/Limitations Symptoms: Pt states he has noticed increased swelling in both of his legs with the right being greater than the left.  The patient states that he has had an ulcer on his Right leg for about five years.  Pt states that he has compression hose at home but has not been able to wear them due to discomfort.  Pt states that he recieved the hose approximately 2 yrs ago.  Pertinent History: Pt had vein obliation done ten years ago.   Pt states that he has an ulcer that comes and goes on the medial aspect of his Rt LE.  It is healed today.   Pain Assessment Currently in Pain?: Yes Pain Score: 3  Pain Location: Leg Pain Orientation: Right  Balance Screening  no falls  Prior Functioning   Pt works full time as a Freight forwarder.  Pt has recently returned to work.  Observation  Observations: Pt has hemosiderin staining B; B edema Pt volumes:  Rt 12941.37cc  Lt [id number] CC  Assessment Palpation Palpation: PT has increased induration of Rt LE  Exercise/Treatments  Manual Therapy Manual Therapy: Other (comment) Other Manual Therapy: Pt  seen for manual decongestive therapy including supraclavicular, superficial and deep abdominal and anterior routing B inguinal/axillary anastomosis.   Physical Therapy Assessment and Plan PT  Assessment and Plan Clinical Impression Statement: Pt is a 63 yo male referred to PT for lymphedema.  Pt has lymphedema secondary to chronic venous insuficiency.  Therapist explained the need for constant compression to contorl swelling.   PT Plan: Pt to be seen for total decongestive services including maunal and compression bandaging to redue fluid of LE's     Goals Home Exercise Program Pt/caregiver will Perform Home Exercise Program:  (improved lymph movement.) PT Short Term Goals PT Short Term Goal 1: Pt to be I in skin car and the care of bandages PT Short Term Goal 2: Pt to be able to verbalize the precautions for decreasing risk of infection PT Short Term Goal 3: Volume to be reduced by 30% PT Long Term Goals Time to Complete Long Term Goals: 4 weeks PT Long Term Goal 1: volume to be reduced by 60% PT Long Term Goal 2: Pt to be able to verbalize the understanding of the maintenance phase of treatment.   Problem List Patient Active Problem List   Diagnosis Date Noted  . Lymphedema of leg 04/08/2013  . Cellulitis of right lower leg 04/04/2013  . Obesity, unspecified 04/04/2013  . Alcohol abuse 04/04/2013  . Cellulitis and abscess of leg 04/04/2013    PT Plan of Care PT Home Exercise Plan: given  GP    RUSSELL,CINDY 04/30/2013, 5:02 PM  Physician Documentation Your signature is required to indicate approval of the treatment plan as stated above.  Please  sign and either send electronically or make a copy of this report for your files and return this physician signed original.   Please mark one 1.__approve of plan  2. ___approve of plan with the following conditions.   ______________________________                                                          _____________________ Physician Signature                                                                                                             Date

## 2013-05-06 ENCOUNTER — Ambulatory Visit (HOSPITAL_COMMUNITY)
Admission: RE | Admit: 2013-05-06 | Discharge: 2013-05-06 | Disposition: A | Discharge status: Home / Self Care | Payer: Commercial Managed Care - PPO | Source: Ambulatory Visit | Attending: Internal Medicine | Admitting: Internal Medicine

## 2013-05-06 DIAGNOSIS — I890 Lymphedema, not elsewhere classified: Secondary | ICD-10-CM | POA: Insufficient documentation

## 2013-05-06 DIAGNOSIS — E669 Obesity, unspecified: Secondary | ICD-10-CM | POA: Insufficient documentation

## 2013-05-06 DIAGNOSIS — L02419 Cutaneous abscess of limb, unspecified: Secondary | ICD-10-CM | POA: Insufficient documentation

## 2013-05-06 DIAGNOSIS — L03119 Cellulitis of unspecified part of limb: Secondary | ICD-10-CM

## 2013-05-06 DIAGNOSIS — IMO0001 Reserved for inherently not codable concepts without codable children: Secondary | ICD-10-CM | POA: Insufficient documentation

## 2013-05-06 NOTE — Progress Notes (Signed)
Physical Therapy Treatment Patient Details  Name: Raymond Hurst MRN: [id number] Date of Birth: 11/14/1950  Today's Date: 05/06/2013 Time: 1650-1728 PT Time Calculation (min): 38 min  Visit#: 2 of 8  Re-eval: 05/30/13 Authorization: [employer]  Charges:  Manual 30', self care 8'  Subjective: Symptoms/Limitations Symptoms: Pt comes today with knee high compression garments purchased 3 years ago ([place] 30-26mmHg).  Pt admits he hardly wears them.   Pain Assessment Currently in Pain?: No/denies   Objective: Manual Therapy Other Manual Therapy: Manual Decongestive therapy for B LE's (Rt>Lt) with bilateral inguinal-axillary anastomosis.  Patient education during manual technique.  Physical Therapy Assessment and Plan PT Assessment and Plan Clinical Impression Statement: Pt educated during session regarding mechanism of lymhedema and how cellulitis occurs.  Encouraged pt to always wear compression.  Instructed to don knee high garments first thing in the morning and wear until bedtime.  Also encouraged patient to purchase bandages to help decompress Rt LE.  Explained he would then go get re-fitted for smaller garment.  Pt. verbalized understanding. PT Plan: continue decongestive therapy for Bilateral LE's; bandaging on Rt LE when bandages purchased.       Problem List Patient Active Problem List   Diagnosis Date Noted  . Lymphedema of leg 04/08/2013  . Cellulitis of right lower leg 04/04/2013  . Obesity, unspecified 04/04/2013  . Alcohol abuse 04/04/2013  . Cellulitis and abscess of leg 04/04/2013       Teena Irani, PTA/CLT 05/06/2013, 5:33 PM

## 2013-05-08 ENCOUNTER — Ambulatory Visit (HOSPITAL_COMMUNITY)
Admission: RE | Admit: 2013-05-08 | Discharge: 2013-05-08 | Disposition: A | Discharge status: Home / Self Care | Payer: Commercial Managed Care - PPO | Source: Ambulatory Visit | Attending: Physical Therapy | Admitting: Physical Therapy

## 2013-05-08 NOTE — Progress Notes (Signed)
Physical Therapy Treatment Patient Details  Name: Raymond Hurst MRN: [id number] Date of Birth: Mar 23, 1950  Today's Date: 05/08/2013 Time: 7169-6789 PT Time Calculation (min): 30 min Visit#: 3 of 8  Re-eval: 05/30/13 Authorization: [employer]  Charges:  Manual 20 minutes, education/self care 8 minutes  Subjective: Symptoms/Limitations Symptoms: Pt running late today (30 minutes) due to going to [facility] to pick up bandages.  Pt wearing knee high garments with noted irritation around anterior ankle crease. Pain Assessment Currently in Pain?: No/denies   Objective: Manual Therapy Other Manual Therapy: Manual Decongestive therapy not performed today due to time.  Bandaging to distal Rt LE using #6 stockinette, 1/2 inch foam, multilayer short stretch bandaging (1-6cm, 1-8cm, 3-10cm)  Physical Therapy Assessment and Plan PT Assessment and Plan Clinical Impression Statement: Unable to complete Manual lymph drainage due to patient 30 minutes late for appointment.  1/2 inch foam cut to fit Rt LE,  Rt LE moisturized prior to application of bandages.  Pt educated on mechanism of bandaging and informed to remove bandaging if experienced any discomfort including swelling or numbness.  Pt instructed to remove tomorrow evening or Friday morning and replace with compression stocking.  Pt instructed to remove compression stocking if begins to rub his anterior ankle but explained his LE should be decompressed enough at that point.  Pt leaving for [facility] and will not return for therapy until next week.  Pt educated on the dangers of the sun and heat and exaccerbates lymphedema symptoms.  Pt verbalized understanding of all education today. PT Plan: continue manual lymph drainage for Bilateral LE's, bandaging to Rt LE.     Problem List Patient Active Problem List   Diagnosis Date Noted  . Lymphedema of leg 04/08/2013  . Cellulitis of right lower leg 04/04/2013  . Obesity, unspecified 04/04/2013   . Alcohol abuse 04/04/2013  . Cellulitis and abscess of leg 04/04/2013      Teena Irani, PTA/CLT 05/08/2013, 5:53 PM

## 2013-05-09 ENCOUNTER — Inpatient Hospital Stay (HOSPITAL_COMMUNITY)
Admission: RE | Admit: 2013-05-09 | Payer: Commercial Managed Care - PPO | Source: Ambulatory Visit | Admitting: Physical Therapy

## 2013-05-13 ENCOUNTER — Ambulatory Visit (HOSPITAL_COMMUNITY)
Admission: RE | Admit: 2013-05-13 | Discharge: 2013-05-13 | Disposition: A | Discharge status: Home / Self Care | Payer: Commercial Managed Care - PPO | Source: Ambulatory Visit | Attending: Physical Therapy | Admitting: Physical Therapy

## 2013-05-13 NOTE — Progress Notes (Signed)
Physical Therapy Lymphedema Treatment Patient Details  Name: Raymond Hurst MRN: [id number] Date of Birth: 1950/08/12  Today's Date: 05/13/2013 Time: 3846-6599 PT Time Calculation (min): 40 min  Visit#: 4 of 8  Re-eval: 05/30/13 Authorization: [employer]  Charges:  Manual 40  Subjective: Symptoms/Limitations Symptoms: Pt states he kept his bandaging on until Friday morning (24 hours) and states it was as small as his Lt LE when he removed the bandages.  Pt reports he kept compression totally off until the next day and donned his compression stocking.  States his stocking continues to be tight around anterior ankle, however states it had swollen back up from where he kept compression off.   Pain Assessment Currently in Pain?: No/denies   Objective: Manual Therapy Other Manual Therapy: Manual Decongestive therapy for Rt LE routing to Rt inguinal-axillary anastomosis.  Moisturized Rt LE prior to bandage application.  Bandaging to distal Rt LE using #6 stockinette, 1/2 inch foam, multilayer short stretch bandaging (1-6cm, 1-8cm, 3-10cm)  Physical Therapy Assessment and Plan PT Assessment and Plan Clinical Impression Statement: Resumed MLD today for Rt LE.  Educated patient of importance of keeping compression on, especially when weight bearing.  No areas of broken skin or signs/symptoms of infection today.  Responding well to current therapy.   PT Plan: continue manual lymph drainage for Bilateral LE's, bandaging to Rt LE.  Measure next visit.     Problem List Patient Active Problem List   Diagnosis Date Noted  . Lymphedema of leg 04/08/2013  . Cellulitis of right lower leg 04/04/2013  . Obesity, unspecified 04/04/2013  . Alcohol abuse 04/04/2013  . Cellulitis and abscess of leg 04/04/2013       Teena Irani, PTA/CLT 05/13/2013, 5:28 PM

## 2013-05-15 ENCOUNTER — Ambulatory Visit (HOSPITAL_COMMUNITY)
Admission: RE | Admit: 2013-05-15 | Discharge: 2013-05-15 | Disposition: A | Discharge status: Home / Self Care | Payer: Commercial Managed Care - PPO | Source: Ambulatory Visit | Attending: Physical Therapy | Admitting: Physical Therapy

## 2013-05-15 NOTE — Progress Notes (Signed)
Physical Therapy Lymphedema Treatment Patient Details  Name: Raymond Hurst MRN: [id number] Date of Birth: [birth year]  Today's Date: 05/15/2013 Time: 1650-1750 PT Time Calculation (min): 60 min  Visit#: 5 of 8  Re-eval: 05/30/13 Authorization: [employer]  Charges:  Manual 55  Subjective: Symptoms/Limitations Symptoms: Pt states the bandages felt good and he no diffculties keeping them on. Pain Assessment Currently in Pain?: No/denies  Objective: Manual Therapy Other Manual Therapy: Manual Decongestive therapy for Rt LE routing to Rt inguinal-axillary anastomosis. Moisturized Rt LE prior to bandage application. Bandaging to distal Rt LE using #6 stockinette, 1/2 inch foam, multilayer short stretch bandaging (1-6cm, 1-8cm, 3-10cm)  Measurements of Bilateral LE's in cm Date 04/30/2013  05/15/2013   Right Left Right  MTP 26.30 27.30 24  ankle 36.7 32.3 34.00  4cm 33.10 30.00 30.50  8cm 32.80 30.00 30.20  12 cm 36.00 32.60 33.30  16cm 40.30 36.50 37.50  20cm 46.50 41.20 41.50  24cm 50.70 44.30 44.00  28cm 52.40 46.80 44.20  32cm 51.80 47.30 44.00  36cm 49.20 46.20 43.00  40cm 45.70 44.00 42.00  44cm 45.50 43.80 47.00  48cm 50.80 47.90 51.50  52cm 54.80 52.70 53.00  56cm 57.00 55.20 58.20  60cm 60.30 59.70 62.50  64cm 65.70 64.00 64.00       Sum of squares 40656.50 35838.76 36310.06  Total Volume 12941.37 11407.84 11557.86    Physical Therapy Assessment and Plan PT Assessment and Plan Clinical Impression Statement: Bandaging removed with noted decompression and reduction in induration.  Rt LE remeasured with overall reduction of 1,383.51cc from initial evaluation on 04/30/13.  Rt LE is now only 150 cc larger than Lt LE.  Pt reported always having difficulty with compression stockings in the past cutting into anterior ankle crease.  To contact fitter to evaluate for stocking with pressure relief in this area.  Faxed prescription to MD.   PT Plan: continue manual lymph drainage with  bandaging to Rt LE.  Await signed script for compression stockings.  Follow up with Hoyle Sauer.     Problem List Patient Active Problem List   Diagnosis Date Noted  . Lymphedema of leg 04/08/2013  . Cellulitis of right lower leg 04/04/2013  . Obesity, unspecified 04/04/2013  . Alcohol abuse 04/04/2013  . Cellulitis and abscess of leg 04/04/2013       Teena Irani, PTA/CLT 05/15/2013, 6:06 PM

## 2013-05-16 ENCOUNTER — Ambulatory Visit (HOSPITAL_COMMUNITY): Payer: Commercial Managed Care - PPO | Admitting: Physical Therapy

## 2013-05-20 ENCOUNTER — Ambulatory Visit (HOSPITAL_COMMUNITY)
Admission: RE | Admit: 2013-05-20 | Discharge: 2013-05-20 | Disposition: A | Discharge status: Home / Self Care | Payer: Commercial Managed Care - PPO | Source: Ambulatory Visit | Attending: Physical Therapy | Admitting: Physical Therapy

## 2013-05-20 NOTE — Progress Notes (Signed)
Physical Therapy Treatment Patient Details  Name: Raymond Hurst MRN: [id number] Date of Birth: 1950/10/26  Today's Date: 05/20/2013 Time: 1650-1730 PT Time Calculation (min): 40 min Visit#: 6 of 8  Re-eval: 05/30/13 Authorization: [employer]  Charges: manual 40 minutes  Subjective: Symptoms/Limitations Symptoms: PT states his legs are feeling good.  Able to keep bandages on a couple days then the garment felt better afterward. Pain Assessment Currently in Pain?: No/denies   Objective: Manual Therapy Other Manual Therapy: Manual Decongestive therapy for Rt LE routing to Rt inguinal-axillary anastomosis. Moisturized Rt LE prior to bandage application. Bandaging to distal Rt LE using #6 stockinette, 1/2 inch foam, multilayer short stretch bandaging (1-6cm, 1-8cm, 3-10cm)  Physical Therapy Assessment and Plan PT Assessment:  Pt continues to respond well to therapy.  Noted increased redness Rt LE with a little warmth, however no appearance of active infection.  Pt educated on signs/symptoms (fever, chills, nausea, pain, etc.) and instructed to remove bandages immediately and report to ED if occurs.  Pt verbalized understanding.  Completed vinegar bath to Rt medial ankle region followed by lotion and bandaging.  PT Plan: continue manual lymph drainage with bandaging to Rt LE.  Await signed script for compression stockings.  Follow up with Hoyle Sauer.  Re-eval X 2 more visits.     Problem List Patient Active Problem List   Diagnosis Date Noted  . Lymphedema of leg 04/08/2013  . Cellulitis of right lower leg 04/04/2013  . Obesity, unspecified 04/04/2013  . Alcohol abuse 04/04/2013  . Cellulitis and abscess of leg 04/04/2013       Teena Irani, PTA/CLT 05/20/2013, 5:31 PM

## 2013-05-22 ENCOUNTER — Ambulatory Visit (HOSPITAL_COMMUNITY)
Admission: RE | Admit: 2013-05-22 | Discharge: 2013-05-22 | Disposition: A | Discharge status: Home / Self Care | Payer: Commercial Managed Care - PPO | Source: Ambulatory Visit | Attending: Physical Therapy | Admitting: Physical Therapy

## 2013-05-22 NOTE — Progress Notes (Signed)
Physical Therapy Treatment Patient Details  Name: Raymond Hurst MRN: [id number] Date of Birth: 10-26-1950  Today's Date: 05/22/2013 Time: 5003-7048 PT Time Calculation (min): 41 min Visit#: 7 of 8  Re-eval: 05/30/13 Authorization: [employer]  Charges:  Manual 40  Subjective: Symptoms/Limitations Symptoms: Pt comes today with his bandages still intact on Rt LE, compression garment on Lt LE.  Pt reports no pain or discomfort. Pain Assessment Currently in Pain?: No/denies  Objective: Manual Therapy Other Manual Therapy: Manual Decongestive therapy for Rt LE routing to Rt inguinal-axillary anastomosis. Cleansed medial distal ankle region and completed vinegar bath to region.   Moisturized Rt LE prior to bandage application. Bandaging to distal Rt LE using #6 stockinette, 1/2 inch foam, multilayer short stretch bandaging (1-6cm, 1-8cm, 3-10cm  Physical Therapy Assessment and Plan PT Assessment and Plan Clinical Impression Statement: Noted some drainage medial distal ankle from area where skin appeared irritated and is the usual site for wounds/breakdown.  debrided dry skin away without noted openings in skin.  Cleansed with vinegar bath to promote more acidic PH and promote healing.  Banaging completed as previous session following manual lymph techniques.  Pt given signed order for compression  garment and urged to make appointment with fitter (tried contacting preferred fitter but has not followed up).   PT Plan: continue manual lymph drainage with bandaging to Rt LE.  Re-evaluate next visit.     Problem List Patient Active Problem List   Diagnosis Date Noted  . Lymphedema of leg 04/08/2013  . Cellulitis of right lower leg 04/04/2013  . Obesity, unspecified 04/04/2013  . Alcohol abuse 04/04/2013  . Cellulitis and abscess of leg 04/04/2013       Teena Irani, PTA/CLT 05/22/2013, 5:35 PM

## 2013-05-23 ENCOUNTER — Ambulatory Visit (HOSPITAL_COMMUNITY): Payer: Commercial Managed Care - PPO | Admitting: Physical Therapy

## 2013-05-29 ENCOUNTER — Ambulatory Visit (HOSPITAL_COMMUNITY)
Admission: RE | Admit: 2013-05-29 | Discharge: 2013-05-29 | Disposition: A | Discharge status: Home / Self Care | Payer: Commercial Managed Care - PPO | Source: Ambulatory Visit | Attending: Physical Therapy | Admitting: Physical Therapy

## 2013-05-29 NOTE — Progress Notes (Signed)
Physical Therapy Treatment Patient Details  Name: Raymond Hurst MRN: [id number] Date of Birth: 1950/06/07  Today's Date: 05/29/2013 Time: 7169-6789 PT Time Calculation (min): 40 min Visit#: 8 of 8  Re-eval: 06/03/13 Authorization: [facility]  Charges manual 38 : Subjective: Symptoms/Limitations Symptoms: Pt states he came Monday for his appointment and found out it had been canceled.   PT reports he has been using his compression hose since Friday, however today they started bothering him and rubbing in the anterior ankle. Pain Assessment Currently in Pain?: No/denies  Objective: Manual Therapy Other Manual Therapy: Manual Decongestive therapy for Rt LE routing to Rt inguinal-axillary anastomosis. Cleansed medial distal ankle region and completed vinegar bath to region. Moisturized Rt LE prior to bandage application. Placed xeroform bandage over dry patch medially to help increase moisture balance and improve skin integrity.  Bandaging to distal Rt LE using #6 stockinette, 1/2 inch foam, multilayer short stretch bandaging (1-6cm, 1-8cm, 3-10cmtaken   Physical Therapy Assessment and Plan PT Assessment and Plan Clinical Impression Statement: Dry patches that resemble drainage patches medial distal ankle.  Removed dry skin, moisturized and placed xeroform gauze over area to improve skin integrity.  Extra cotton padding also placed over this region prior to bandaging to promote improved lymph flow.  Pt instructed to remove bandages on thursday night/friday morning and replace with stocking.  Faxed additional paperwork to fitter requesting appointment for garment.  PT Plan: continue manual lymph drainage with bandaging to Rt LE.  Re-evaluate next visit.     Problem List Patient Active Problem List   Diagnosis Date Noted  . Lymphedema of leg 04/08/2013  . Cellulitis of right lower leg 04/04/2013  . Obesity, unspecified 04/04/2013  . Alcohol abuse 04/04/2013  . Cellulitis and abscess of leg  04/04/2013      Teena Irani, PTA/CLT 05/29/2013, 6:22 PM

## 2013-06-03 ENCOUNTER — Ambulatory Visit (HOSPITAL_COMMUNITY)
Admission: RE | Admit: 2013-06-03 | Discharge: 2013-06-03 | Disposition: A | Discharge status: Home / Self Care | Payer: Commercial Managed Care - PPO | Source: Ambulatory Visit | Attending: Internal Medicine | Admitting: Internal Medicine

## 2013-06-03 DIAGNOSIS — L03119 Cellulitis of unspecified part of limb: Secondary | ICD-10-CM

## 2013-06-03 DIAGNOSIS — IMO0001 Reserved for inherently not codable concepts without codable children: Secondary | ICD-10-CM | POA: Insufficient documentation

## 2013-06-03 DIAGNOSIS — I890 Lymphedema, not elsewhere classified: Secondary | ICD-10-CM | POA: Insufficient documentation

## 2013-06-03 DIAGNOSIS — E669 Obesity, unspecified: Secondary | ICD-10-CM | POA: Insufficient documentation

## 2013-06-03 DIAGNOSIS — L02419 Cutaneous abscess of limb, unspecified: Secondary | ICD-10-CM | POA: Insufficient documentation

## 2013-06-03 NOTE — Progress Notes (Signed)
Physical Therapy Discharge Summary Patient Details  Name: DALAN COWGER MRN: [id number] Date of Birth: Nov 25, 1950  Today's Date: 06/03/2013 Time: 1655-1730 PT Time Calculation (min): 35 min Visit#: 9  Authorization: [employer]  Charges:  Manual 30  Subjective: Symptoms/Limitations Symptoms: Pt comes today with compression garments bilaterally.  Pt reports no difficulty donning/doffing or wearing garments. No pain Pain Assessment Currently in Pain?: No/denies   Objective: Manual Therapy Manual Therapy: Other (comment) Other Manual Therapy: Right LE remeasured today.  Cleansed medial distal ankle region and completed vinegar bath to region. Moisturized Rt LE prior to bandage application. Placed xeroform bandage over dry patch medially to help increase moisture balance and improve skin integrity. Bandaging to distal Rt LE using #6 stockinette, 1/2 inch foam, multilayer short stretch bandaging (1-6cm, 1-8cm, 3-10cm).   Date 04/30/2013  05/15/2013 06/03/2013   Right Left Right Right  MTP 26.30 27._0 ankle 36.7 32.3 34.00 33.00  4cm 33.10 30.00 30.50 27.80  8cm 32.80 30.00 30.20 30.00  12 cm 36.00 32.60 33.30 35.00  16cm 40.30 36.50 37.50 40.20  20cm 46.50 41.20 41.50 43.40  24cm 50.70 44.30 44.00 48.00  28cm 52.40 46.80 44.20 46.00  32cm 51.80 47.30 44.00 41.00  36cm 49.20 46.20 43.00 42.00  40cm 45.70 44.00 42.00 43.00  44cm 45.50 43.80 47.00 48.00  48cm 50.80 47.90 51.50 50.20  52cm 54.80 52.70 53.00 53.00  56cm 57.00 55.20 58.20 58.00  60cm 60.30 59.70 62.50 63.00  64cm 65.70 64.00 64.00 64.00        Sum of squares 40656.50 35838.76 36310.06 36887.48  Total Volume 12941.37 11407.84 11557.86 11741.65    Physical Therapy Assessment and Plan PT Assessment and Plan Clinical Impression Statement: Pt has completed 9 visits for decongestive therapy for Rt LE lymphedema.  Pt has progressed well, meeting all goals with overall reduction of 78% from [place] .  Pt is independent with  self care including maintenance of skin and control of congestion in Rt LE.  Pt educated to Quarry manager every morining and doff every evening to moisturize LE's.  Pt educated to replace garment every 6 months.  Pt instructed with decongestive massage techniques and bandaging for Rt LE as needed.  Pt verbalized understanding. PT Plan: Pt has met all goals, discharge to self care.  Garment fitter contacted by therapist to follow up with measuring.     Goals Home Exercise Program Pt/caregiver will Perform Home Exercise Program for improved lymph movement- Progress: Met PT Short Term Goals PT Short Term Goal 1: Pt to be Independent  in skin care and the care of bandages- Progress: Met PT Short Term Goal 2: Pt to be able to verbalize the precautions for decreasing risk of infection- Progress: Met PT Short Term Goal 3: Volume to be reduced by 30% - Progress: Met  PT Long Term Goals Time to Complete Long Term Goals: 4 weeks PT Long Term Goal 1: volume to be reduced by 60%- Progress: Met PT Long Term Goal 2: Pt to be able to verbalize the understanding of the maintenance phase of treatment. - Progress: Met   Problem List Patient Active Problem List   Diagnosis Date Noted  . Lymphedema of leg 04/08/2013  . Cellulitis of right lower leg 04/04/2013  . Obesity, unspecified 04/04/2013  . Alcohol abuse 04/04/2013  . Cellulitis and abscess of leg 04/04/2013    PT Plan of Care Consulted and Agree with Plan of Care: Patient   Teena Irani, PTA/CLT 06/03/2013, 5:49  PM

## 2013-06-05 ENCOUNTER — Ambulatory Visit (HOSPITAL_COMMUNITY): Payer: Commercial Managed Care - PPO | Admitting: Physical Therapy

## 2013-06-10 ENCOUNTER — Ambulatory Visit (HOSPITAL_COMMUNITY): Payer: Commercial Managed Care - PPO | Admitting: Physical Therapy

## 2013-06-12 ENCOUNTER — Ambulatory Visit (HOSPITAL_COMMUNITY): Payer: Commercial Managed Care - PPO | Admitting: Physical Therapy

## 2013-06-17 ENCOUNTER — Ambulatory Visit (HOSPITAL_COMMUNITY): Payer: Commercial Managed Care - PPO | Admitting: Physical Therapy

## 2013-06-19 ENCOUNTER — Ambulatory Visit (HOSPITAL_COMMUNITY): Payer: Commercial Managed Care - PPO | Admitting: Physical Therapy

## 2013-06-26 ENCOUNTER — Ambulatory Visit (HOSPITAL_COMMUNITY): Payer: Commercial Managed Care - PPO | Admitting: Physical Therapy

## 2013-06-27 ENCOUNTER — Ambulatory Visit (HOSPITAL_COMMUNITY): Payer: Commercial Managed Care - PPO | Admitting: Physical Therapy

## 2015-06-02 DIAGNOSIS — H9313 Tinnitus, bilateral: Secondary | ICD-10-CM | POA: Insufficient documentation

## 2017-12-18 ENCOUNTER — Other Ambulatory Visit: Payer: Self-pay | Admitting: Orthopedic Surgery

## 2017-12-18 DIAGNOSIS — M25571 Pain in right ankle and joints of right foot: Secondary | ICD-10-CM | POA: Diagnosis not present

## 2017-12-18 DIAGNOSIS — M1712 Unilateral primary osteoarthritis, left knee: Secondary | ICD-10-CM | POA: Diagnosis not present

## 2018-01-01 ENCOUNTER — Ambulatory Visit
Admission: RE | Admit: 2018-01-01 | Discharge: 2018-01-01 | Disposition: A | Discharge status: Home / Self Care | Payer: Commercial Managed Care - PPO | Source: Ambulatory Visit | Attending: Orthopedic Surgery | Admitting: Orthopedic Surgery

## 2018-01-01 DIAGNOSIS — M19071 Primary osteoarthritis, right ankle and foot: Secondary | ICD-10-CM | POA: Diagnosis not present

## 2018-01-01 DIAGNOSIS — M25571 Pain in right ankle and joints of right foot: Secondary | ICD-10-CM

## 2018-01-02 DIAGNOSIS — H2513 Age-related nuclear cataract, bilateral: Secondary | ICD-10-CM | POA: Diagnosis not present

## 2018-01-02 DIAGNOSIS — H5200 Hypermetropia, unspecified eye: Secondary | ICD-10-CM | POA: Diagnosis not present

## 2018-01-02 DIAGNOSIS — Z0100 Encounter for examination of eyes and vision without abnormal findings: Secondary | ICD-10-CM | POA: Diagnosis not present

## 2018-04-04 DIAGNOSIS — H663X2 Other chronic suppurative otitis media, left ear: Secondary | ICD-10-CM | POA: Insufficient documentation

## 2018-04-04 DIAGNOSIS — H9113 Presbycusis, bilateral: Secondary | ICD-10-CM | POA: Diagnosis not present

## 2018-04-04 DIAGNOSIS — H9192 Unspecified hearing loss, left ear: Secondary | ICD-10-CM | POA: Diagnosis not present

## 2018-04-04 DIAGNOSIS — H9313 Tinnitus, bilateral: Secondary | ICD-10-CM | POA: Diagnosis not present

## 2018-04-13 DIAGNOSIS — S3992XA Unspecified injury of lower back, initial encounter: Secondary | ICD-10-CM | POA: Diagnosis not present

## 2018-04-13 DIAGNOSIS — M533 Sacrococcygeal disorders, not elsewhere classified: Secondary | ICD-10-CM | POA: Diagnosis not present

## 2018-04-13 DIAGNOSIS — M545 Low back pain: Secondary | ICD-10-CM | POA: Diagnosis not present

## 2018-04-17 DIAGNOSIS — R3989 Other symptoms and signs involving the genitourinary system: Secondary | ICD-10-CM | POA: Diagnosis not present

## 2018-04-17 DIAGNOSIS — W19XXXD Unspecified fall, subsequent encounter: Secondary | ICD-10-CM | POA: Diagnosis not present

## 2018-04-17 DIAGNOSIS — S300XXA Contusion of lower back and pelvis, initial encounter: Secondary | ICD-10-CM | POA: Diagnosis not present

## 2018-04-18 DIAGNOSIS — M9904 Segmental and somatic dysfunction of sacral region: Secondary | ICD-10-CM | POA: Diagnosis not present

## 2018-04-18 DIAGNOSIS — M9903 Segmental and somatic dysfunction of lumbar region: Secondary | ICD-10-CM | POA: Diagnosis not present

## 2018-04-18 DIAGNOSIS — M5136 Other intervertebral disc degeneration, lumbar region: Secondary | ICD-10-CM | POA: Diagnosis not present

## 2018-04-18 DIAGNOSIS — M9905 Segmental and somatic dysfunction of pelvic region: Secondary | ICD-10-CM | POA: Diagnosis not present

## 2018-04-19 DIAGNOSIS — M9903 Segmental and somatic dysfunction of lumbar region: Secondary | ICD-10-CM | POA: Diagnosis not present

## 2018-04-19 DIAGNOSIS — M9905 Segmental and somatic dysfunction of pelvic region: Secondary | ICD-10-CM | POA: Diagnosis not present

## 2018-04-19 DIAGNOSIS — M9904 Segmental and somatic dysfunction of sacral region: Secondary | ICD-10-CM | POA: Diagnosis not present

## 2018-04-19 DIAGNOSIS — M5136 Other intervertebral disc degeneration, lumbar region: Secondary | ICD-10-CM | POA: Diagnosis not present

## 2018-04-23 DIAGNOSIS — M9905 Segmental and somatic dysfunction of pelvic region: Secondary | ICD-10-CM | POA: Diagnosis not present

## 2018-04-23 DIAGNOSIS — M9903 Segmental and somatic dysfunction of lumbar region: Secondary | ICD-10-CM | POA: Diagnosis not present

## 2018-04-23 DIAGNOSIS — M5136 Other intervertebral disc degeneration, lumbar region: Secondary | ICD-10-CM | POA: Diagnosis not present

## 2018-04-23 DIAGNOSIS — M9904 Segmental and somatic dysfunction of sacral region: Secondary | ICD-10-CM | POA: Diagnosis not present

## 2018-04-25 DIAGNOSIS — M5136 Other intervertebral disc degeneration, lumbar region: Secondary | ICD-10-CM | POA: Diagnosis not present

## 2018-04-26 ENCOUNTER — Telehealth (HOSPITAL_COMMUNITY): Payer: Self-pay | Admitting: Physical Therapy

## 2018-04-26 NOTE — Telephone Encounter (Signed)
Therapist called regarding referral clinic received for this patient to discuss that the clinic is currently closed and discuss with the patient how he was doing, to make sure it was not an emergent situation, and if not to schedule a referral for the future. Therapist also provided the phone number for the patient to call the clinic so that we could discuss.  Clarene Critchley PT, DPT 8:27 AM, 04/26/18 [phone]

## 2018-05-11 ENCOUNTER — Other Ambulatory Visit: Payer: Self-pay

## 2018-05-11 ENCOUNTER — Ambulatory Visit (HOSPITAL_COMMUNITY): Payer: Medicare HMO | Attending: Physical Medicine and Rehabilitation

## 2018-05-11 ENCOUNTER — Encounter (HOSPITAL_COMMUNITY): Payer: Self-pay

## 2018-05-11 DIAGNOSIS — M545 Low back pain, unspecified: Secondary | ICD-10-CM

## 2018-05-11 DIAGNOSIS — R262 Difficulty in walking, not elsewhere classified: Secondary | ICD-10-CM | POA: Insufficient documentation

## 2018-05-11 DIAGNOSIS — M6281 Muscle weakness (generalized): Secondary | ICD-10-CM | POA: Diagnosis not present

## 2018-05-11 NOTE — Therapy (Signed)
[place] [place], [place], [address] Phone: [phone]   Fax:  [phone]  Physical Therapy Evaluation  Patient Details  Name: Raymond Hurst MRN: [id number] Date of Birth: 1950-03-24 Referring Provider (PT): Suella Broad, MD   Encounter Date: 05/11/2018  PT End of Session - 05/11/18 1105    Visit Number  1    Number of Visits  8    Date for PT Re-Evaluation  06/08/18    Authorization Type  [employer]    Authorization Time Period  05/11/18 to 06/08/18    Authorization - Visit Number  1    Authorization - Number of Visits  10    PT Start Time  0945    PT Stop Time  1040    PT Time Calculation (min)  55 min    Activity Tolerance  Patient tolerated treatment well    Behavior During Therapy  [facility] for tasks assessed/performed       Past Medical History:  Diagnosis Date  . Cellulitis of right lower leg 04/04/2013  . Lymphedema of leg 04/08/2013    Past Surgical History:  Procedure Laterality Date  . INNER EAR SURGERY    . ORTHOPEDIC SURGERY      There were no vitals filed for this visit.   Subjective Assessment - 05/11/18 0948    Subjective  Pt states that on 04/12/18, he lost his balance and landed directly onto his buttocks on a hard ceramic floor; he fell while he was messing with his dogs and fell straight back onto his butt. His pain is located across his lower back but slightly more to the right. No pain down the legs, no n/t down legs, but he does state that he has been urinating more frequently but feels this might be due to his keto diet and increased water consumption. His pain is worse in the mornings; after he has been laying down all night he is stiff. Pt states that he feels like he is improving every day, getting stronger, and his pain is "lightyears" better now since his fall. He has a dull ache all day but it doesn't incapacitate him. The longer he walks, the more his back hurts; prior to fall he  could walk unlimited distances.     Limitations  Walking    How long can you sit comfortably?  no issues    How long can you stand comfortably?  30-18mins    How long can you walk comfortably?  1 hour    Diagnostic tests  x-rays were negative    Patient Stated Goals  get back to near normal    Currently in Pain?  Yes    Pain Score  3     Pain Location  Back    Pain Orientation  Lower;Right    Pain Descriptors / Indicators  Aching;Dull    Pain Type  Acute pain    Pain Onset  1 to 4 weeks ago    Pain Frequency  Constant    Aggravating Factors   walking long periods, waking up in the mornings    Pain Relieving Factors  heating pad, ice packs, twisting first thing in AM    Effect of Pain on Daily Activities  mild increase         [facility] PT Assessment - 05/11/18 0001      Assessment   Medical Diagnosis  degeneration of lumbar intervertebral disc, other degeneration of intervertebral disc,  lumbar region    Referring Provider (PT)  Suella Broad, MD    Onset Date/Surgical Date  04/12/18    Next MD Visit  nothing scheduled right now    Prior Therapy  none for present issue, chiropractic work for this      Balance Screen   Has the patient fallen in the past 6 months  Yes    How many times?  1    Has the patient had a decrease in activity level because of a fear of falling?   Yes    Is the patient reluctant to leave their home because of a fear of falling?   No      Prior Function   Level of Independence  Independent    Vocation  Retired    Leisure  play bass guitar in band      Observation/Other Assessments   Observations  POE x2 mins reduced pain    Focus on Therapeutic Outcomes (FOTO)   n/a      Sensation   Light Touch  Appears Intact      Posture/Postural Control   Posture/Postural Control  Postural limitations    Postural Limitations  Rounded Shoulders;Forward head;Increased thoracic kyphosis;Increased lumbar lordosis      ROM / Strength   AROM / PROM / Strength   AROM;Strength      AROM   Overall AROM Comments  in lumbar lordosis with increased thoracic kyphosis    AROM Assessment Site  Lumbar    Lumbar Flexion  25-50% limited; RFIS x10 reps: no change    Lumbar Extension  WFL, painful; REIS x10 reps: no change    Lumbar - Right Side Bend  WFL, pull on L    Lumbar - Left Side Bend  WFL, pull on R    Lumbar - Right Rotation  WFL, non-painful    Lumbar - Left Rotation  WFL, non-painful      Strength   Strength Assessment Site  Hip;Knee;Ankle    Right Hip Flexion  4+/5    Right Hip Extension  3+/5    Right Hip ABduction  4/5    Left Hip Flexion  4+/5    Left Hip Extension  3+/5    Left Hip ABduction  4/5    Right Knee Extension  5/5    Left Knee Extension  5/5    Right Ankle Dorsiflexion  5/5    Left Ankle Dorsiflexion  5/5      Palpation   Spinal mobility  hypomobile throughout lumbar spine (and thoracic spine via gross assessment); L2-3 most tender to CPAs, no change with prolonged bout, slightly increased pain at L2 with it    Palpation comment  increased restrictions thorughout thoracic and lumbar paraspinals, tender to palpation R>L      Special Tests    Special Tests  Lumbar    Lumbar Tests  Straight Leg Raise;Slump Test      Slump test   Findings  Negative    Comment  bil      Straight Leg Raise   Findings  Negative    Comment  bil      Ambulation/Gait   Ambulation Distance (Feet)  646 Feet   3MWT   Assistive device  None    Gait Pattern  Step-through pattern;Decreased dorsiflexion - right;Decreased dorsiflexion - left;Trendelenburg;Lateral trunk lean to right    Gait Comments  increased to 5/10 LBP      Balance   Balance Assessed  Yes  Static Standing Balance   Static Standing - Balance Support  No upper extremity supported    Static Standing Balance -  Activities   Single Leg Stance - Right Leg;Single Leg Stance - Left Leg    Static Standing - Comment/# of Minutes  1.5sec or < BLE      Standardized Balance  Assessment   Standardized Balance Assessment  Five Times Sit to Stand    Five times sit to stand comments   14.3sec, no UE           Objective measurements completed on examination: See above findings.        PT Education - 05/11/18 1105    Education Details  exam findings, HEP, POC, telehealth, MyChart    Person(s) Educated  Patient    Methods  Explanation;Demonstration;Handout    Comprehension  Verbalized understanding       PT Short Term Goals - 05/11/18 1116      PT SHORT TERM GOAL #1   Title  Pt will have reduced pain to 4/10 in the mornings on a daily basis in order to demo overall improved function and maximize his ability to perform ADLs with greater ease.     Time  2    Period  Weeks    Status  New    Target Date  05/25/18      PT SHORT TERM GOAL #2   Title  Pt will report being able to walk for 2 hours in order to allow him to go grocery shopping with greater ease and with less pain.     Time  2    Period  Weeks    Status  New      PT SHORT TERM GOAL #3   Title  Pt will be able to perform bil SLS for 5 sec to demo improved core and functional hip strength in order to maximize gait.    Time  2    Period  Weeks    Status  New        PT Long Term Goals - 05/11/18 1117      PT LONG TERM GOAL #1   Title  Pt will report reduced pain to 2/10 or < 3 mornings out of the week or better to demo improved overall function and overall flexibility.     Time  4    Period  Weeks    Status  New    Target Date  06/08/18      PT LONG TERM GOAL #2   Title  Pt will report being able to walk for unlimited amounts of time/distance to demo improved overall core and functional strength and return to PLOF.    Time  4    Period  Weeks    Status  New      PT LONG TERM GOAL #3   Title  Pt will be able to perform bil SLS for 10 sec to further demo improved functional strength and balance of bil hips in order to allow him to walk for unlimited distances.     Time  4     Period  Weeks    Status  New             Plan - 05/11/18 1106    Clinical Impression Statement  Pt is pleasant 68YO M who presents to OPPT with acute-subacute LBP s/p fall on 04/12/18 when he fell directly posterior onto his buttocks. No radicular symptoms or other neurological  involvement noted. His pain and function have steadily improved since his fall but he is still having the most pain in the mornings, difficulty walking long distances, and standing for long periods of time. Pt presents with deficits in core strength, hip strength, balance, gait, and demos increased soft tissue restrictions with hypomobility of lumbar and thoracic spine. Pt needs skilled PT intervention to address these deficits in order to further reduce his pain and promote return to PLOF. Due to COVID-19, our clinic is offering telehealth sessions and/or weekly phone calls to check-in on patient. Pt agreeable to telehealth sessions and PT verified pt had internet access, video capable device, and sent email for pt to activate MyChart. PT provided HEP for stretching and hip strengthening and went over in detail with pt, ensuring no questions or concerns.     Personal Factors and Comorbidities  Age;Comorbidity 1    Examination-Activity Limitations  Bed Mobility;Locomotion Level;Stand    Examination-Participation Restrictions  Community Activity;Shop    Stability/Clinical Decision Making  Stable/Uncomplicated    Clinical Decision Making  Low    Rehab Potential  Good    PT Frequency  2x / week    PT Duration  4 weeks    PT Treatment/Interventions  ADLs/Self Care Home Management;Aquatic Therapy;Cryotherapy;Gait training;Stair training;Functional mobility training;Therapeutic activities;Therapeutic exercise;Balance training;Neuromuscular re-education;Patient/family education;Orthotic Fit/Training;Manual techniques;Passive range of motion;Dry needling;Taping;Spinal Manipulations;Ultrasound;Electrical Stimulation    PT Next  Visit Plan  review goals, review log rolling for bed mobility, review HEP; lumbar stretching/ROM, thoracic mobility, core, hip, functional strengthening, balance training    PT Home Exercise Plan  eval: POE, SKTC, DKTC, supine HS stretch with strap, bridging, clams with RTB    Consulted and Agree with Plan of Care  Patient       Patient will benefit from skilled therapeutic intervention in order to improve the following deficits and impairments:  Decreased balance, Decreased range of motion, Decreased strength, Difficulty walking, Hypomobility, Increased fascial restricitons, Increased muscle spasms, Impaired flexibility, Improper body mechanics, Postural dysfunction, Pain  Visit Diagnosis: Acute bilateral low back pain without sciatica - Plan: PT plan of care cert/re-cert  Difficulty in walking, not elsewhere classified - Plan: PT plan of care cert/re-cert  Muscle weakness (generalized) - Plan: PT plan of care cert/re-cert     Problem List Patient Active Problem List   Diagnosis Date Noted  . Lymphedema of leg 04/08/2013  . Cellulitis of right lower leg 04/04/2013  . Obesity, unspecified 04/04/2013  . Alcohol abuse 04/04/2013  . Cellulitis and abscess of leg 04/04/2013       Geraldine Solar PT, DPT   [facility] [address] [place], [place], [address] Phone: [phone]   Fax:  [phone]  Name: Raymond Hurst MRN: [id number] Date of Birth: 10-08-1950

## 2018-05-11 NOTE — Patient Instructions (Signed)
Access Code: [email or web address]  URL: [email or web address]  Date: 05/11/2018  Prepared by: Geraldine Solar   Exercises Prone Press Up on Elbows - 1-3 reps - 2-43mins hold - 1-2x daily - 7x weekly Supine Hamstring Stretch with Strap - 3-5 reps - 30-60seconds hold - 1-2x daily - 7x weekly Hooklying Single Knee to Chest - 3-5 reps - 15-30seconds hold - 1-2x daily - 7x weekly Supine Double Knee to Chest - 3-5 reps - 15-30seconds hold - 1-2x daily - 7x weekly Supine Bridge - 10 reps - 3 sets - 1x daily - 7x weekly Clamshell with Resistance - 10 reps - 3 sets - 1x daily - 7x weekly

## 2018-05-15 ENCOUNTER — Telehealth (HOSPITAL_COMMUNITY): Payer: Self-pay | Admitting: General Practice

## 2018-05-15 NOTE — Telephone Encounter (Signed)
05/15/18  Left patient a message that his insurance doesn't cover Telehealth visits and he would be responsible for payment if he still wanted to go thru with doing the video visits.  I asked him to call back if he was still interested.

## 2018-05-16 ENCOUNTER — Telehealth (HOSPITAL_COMMUNITY): Payer: Self-pay

## 2018-05-16 NOTE — Telephone Encounter (Signed)
Pt stated on evaluation day he did not want to do Telehealth. L/m req return phone call to confrim that he is still not interested. NF 05/16/2018

## 2018-05-17 ENCOUNTER — Telehealth (HOSPITAL_COMMUNITY): Payer: Self-pay

## 2018-05-17 NOTE — Telephone Encounter (Signed)
Weekly phone call check-in made today and PT left voicemail for pt. Asked that he call back if he had any questions or concerns about his current HEP and to let us know how his LBP is doing.   Geraldine Solar PT, DPT

## 2018-05-18 ENCOUNTER — Telehealth (HOSPITAL_COMMUNITY): Payer: Self-pay | Admitting: Physical Therapy

## 2018-05-18 NOTE — Telephone Encounter (Signed)
Patient's wife called and stated that she feels the patient would really benefit from in-person manual therapy. Therapist explained that the clinic is still only closed and providing primarily telehealth therapy at this time. Explained that she would leave a message for the evaluating therapist to call the patient back next Monday to discuss his plan of care.  Clarene Critchley PT, DPT 11:09 AM, 05/18/18 [phone]

## 2018-05-21 ENCOUNTER — Telehealth (HOSPITAL_COMMUNITY): Payer: Self-pay

## 2018-05-21 NOTE — Telephone Encounter (Signed)
Attempted to call both pt and his wife back regarding his POC going forward. Left message for wife to call office back and left #s for her to do so, so that we could discuss Mr. Rathbun' POC going forward.  Geraldine Solar PT, DPT

## 2018-05-24 ENCOUNTER — Other Ambulatory Visit: Payer: Self-pay

## 2018-05-24 ENCOUNTER — Ambulatory Visit (HOSPITAL_COMMUNITY): Payer: Medicare HMO

## 2018-05-24 ENCOUNTER — Encounter (HOSPITAL_COMMUNITY): Payer: Self-pay

## 2018-05-24 DIAGNOSIS — M545 Low back pain, unspecified: Secondary | ICD-10-CM

## 2018-05-24 DIAGNOSIS — M6281 Muscle weakness (generalized): Secondary | ICD-10-CM | POA: Diagnosis not present

## 2018-05-24 DIAGNOSIS — R262 Difficulty in walking, not elsewhere classified: Secondary | ICD-10-CM

## 2018-05-24 NOTE — Therapy (Signed)
[facility] [place], [place], [address] Phone: [phone]   Fax:  [phone]  Physical Therapy Treatment  Patient Details  Name: Raymond Hurst MRN: [id number] Date of Birth: 1950/10/02 Referring Provider (PT): Suella Broad, MD   Encounter Date: 05/24/2018  PT End of Session - 05/24/18 1531    Visit Number  2    Number of Visits  8    Date for PT Re-Evaluation  06/08/18    Authorization Type  [employer] H[employer]    Authorization Time Period  05/11/18 to 06/08/18    Authorization - Visit Number  2    Authorization - Number of Visits  10    PT Start Time  6761    PT Stop Time  1625    PT Time Calculation (min)  55 min    Activity Tolerance  Patient tolerated treatment well    Behavior During Therapy  [facility] for tasks assessed/performed       Past Medical History:  Diagnosis Date  . Cellulitis of right lower leg 04/04/2013  . Lymphedema of leg 04/08/2013    Past Surgical History:  Procedure Laterality Date  . INNER EAR SURGERY    . ORTHOPEDIC SURGERY      There were no vitals filed for this visit.  Subjective Assessment - 05/24/18 1531    Subjective  Pt states that his back is still hurting. He tried the exercises and feels like it increased his pain.     Limitations  Walking    How long can you sit comfortably?  no issues    How long can you stand comfortably?  30-73mins    How long can you walk comfortably?  1 hour    Diagnostic tests  x-rays were negative    Patient Stated Goals  get back to near normal    Currently in Pain?  Yes    Pain Score  6     Pain Location  Back    Pain Orientation  Lower;Right    Pain Descriptors / Indicators  Aching;Dull    Pain Type  Acute pain    Pain Onset  1 to 4 weeks ago    Pain Frequency  Constant    Aggravating Factors   walking long periods, waking up in the mornings    Pain Relieving Factors  heating pad, ice packs, twisting first thing in AM    Effect of Pain on Daily  Activities  mild increase                       [facility] Adult PT Treatment/Exercise - 05/24/18 0001      Exercises   Exercises  Lumbar      Lumbar Exercises: Stretches   Other Lumbar Stretch Exercise  fwd, R/L lumbar flexion stretch with large physioball 5x10" each direction      Lumbar Exercises: Seated   Sit to Stand  10 reps    Sit to Stand Limitations  min cues for form    Other Seated Lumbar Exercises  seated thoracic rotation with dowel and bolster 5x10" holds    Other Seated Lumbar Exercises  thoracic extension over foam roll x5RT at 2 different segments      Lumbar Exercises: Supine   Bridge  10 reps    Bridge Limitations  cues for form, to do during pain-free ROM, and breathing    Other Supine Lumbar Exercises  decompression 1-5 x5 reps  each      Manual Therapy   Manual Therapy  Joint mobilization;Soft tissue mobilization    Manual therapy comments  separate rest of treatment    Joint Mobilization  CPAs to Throacic spine to reduce pain and improve mobility    Soft tissue mobilization  STM to lumbar paraspinals and thoracolumbar fascia to reduce pain and restrictions               PT Short Term Goals - 05/24/18 1633      PT SHORT TERM GOAL #1   Title  Pt will have reduced pain to 4/10 in the mornings on a daily basis in order to demo overall improved function and maximize his ability to perform ADLs with greater ease.     Time  2    Period  Weeks    Status  On-going    Target Date  05/25/18      PT SHORT TERM GOAL #2   Title  Pt will report being able to walk for 2 hours in order to allow him to go grocery shopping with greater ease and with less pain.     Time  2    Period  Weeks    Status  On-going      PT SHORT TERM GOAL #3   Title  Pt will be able to perform bil SLS for 5 sec to demo improved core and functional hip strength in order to maximize gait.    Time  2    Period  Weeks    Status  On-going        PT Long Term Goals -  05/24/18 1633      PT LONG TERM GOAL #1   Title  Pt will report reduced pain to 2/10 or < 3 mornings out of the week or better to demo improved overall function and overall flexibility.     Time  4    Period  Weeks    Status  On-going      PT LONG TERM GOAL #2   Title  Pt will report being able to walk for unlimited amounts of time/distance to demo improved overall core and functional strength and return to PLOF.    Time  4    Period  Weeks    Status  On-going      PT LONG TERM GOAL #3   Title  Pt will be able to perform bil SLS for 10 sec to further demo improved functional strength and balance of bil hips in order to allow him to walk for unlimited distances.     Time  4    Period  Weeks    Status  On-going            Plan - 05/24/18 1632    Clinical Impression Statement  Began session with review of goals and administration of FOTO; no f/u questions afterwards and pt scored 42% limited due to his back pain. Rest of session focused on general lumbar flexibility/mobility, strength, and reducing pain. Added lumbar flex stretch with ball and thoracic mobility work for improved overall mobility and reduced pain. Cues for proper breathing and technique required throughout entire session. Ended with manual STM to lumbar spine and thoracic CPAs to reduce pain and improve mobility. Pt reported reduced pat to 4-5/10 at EOs (was 6-7) and stated that he felt much better. Changed HEP to decompression 1-5 since he stated that the initial HEP did not help. Recommend pt come  into clinic 2x/week for remainder of POC since pt has an acute need for hands-on therapy and pt verbalized consent to in-clinic treatments. Continue as planned, progressing as able.     Personal Factors and Comorbidities  Age;Comorbidity 1    Examination-Activity Limitations  Bed Mobility;Locomotion Level;Stand    Examination-Participation Restrictions  Community Activity;Shop    Stability/Clinical Decision Making   Stable/Uncomplicated    Rehab Potential  Good    PT Frequency  2x / week    PT Duration  4 weeks    PT Treatment/Interventions  ADLs/Self Care Home Management;Aquatic Therapy;Cryotherapy;Gait training;Stair training;Functional mobility training;Therapeutic activities;Therapeutic exercise;Balance training;Neuromuscular re-education;Patient/family education;Orthotic Fit/Training;Manual techniques;Passive range of motion;Dry needling;Taping;Spinal Manipulations;Ultrasound;Electrical Stimulation    PT Next Visit Plan  continue lumbar stretching/ROM, thoracic mobility, core, hip, functional strengthening, balance training; and manual therapy for joint mobility and soft tissue restrictions     PT Home Exercise Plan  4/23: changed to decompresison 1-5 only    Consulted and Agree with Plan of Care  Patient       Patient will benefit from skilled therapeutic intervention in order to improve the following deficits and impairments:  Decreased balance, Decreased range of motion, Decreased strength, Difficulty walking, Hypomobility, Increased fascial restricitons, Increased muscle spasms, Impaired flexibility, Improper body mechanics, Postural dysfunction, Pain  Visit Diagnosis: Acute bilateral low back pain without sciatica  Difficulty in walking, not elsewhere classified  Muscle weakness (generalized)     Problem List Patient Active Problem List   Diagnosis Date Noted  . Lymphedema of leg 04/08/2013  . Cellulitis of right lower leg 04/04/2013  . Obesity, unspecified 04/04/2013  . Alcohol abuse 04/04/2013  . Cellulitis and abscess of leg 04/04/2013       Geraldine Solar PT, DPT   [facility] [address] [place], [place], [address] Phone: [phone]   Fax:  [phone]  Name: Raymond Hurst MRN: [id number] Date of Birth: Jun 19, 1950

## 2018-05-28 ENCOUNTER — Encounter (HOSPITAL_COMMUNITY): Payer: Self-pay

## 2018-05-28 ENCOUNTER — Other Ambulatory Visit: Payer: Self-pay

## 2018-05-28 ENCOUNTER — Ambulatory Visit (HOSPITAL_COMMUNITY): Payer: Medicare HMO

## 2018-05-28 DIAGNOSIS — M6281 Muscle weakness (generalized): Secondary | ICD-10-CM

## 2018-05-28 DIAGNOSIS — R262 Difficulty in walking, not elsewhere classified: Secondary | ICD-10-CM | POA: Diagnosis not present

## 2018-05-28 DIAGNOSIS — M545 Low back pain, unspecified: Secondary | ICD-10-CM

## 2018-05-28 NOTE — Therapy (Signed)
[place], [place], [address] Phone: [phone]   Fax:  [phone]  Physical Therapy Treatment  Patient Details  Name: Raymond Hurst MRN: [id number] Date of Birth: 03-29-1950 Referring Provider (PT): Suella Broad, MD   Encounter Date: 05/28/2018  PT End of Session - 05/28/18 0936    Visit Number  3    Number of Visits  8    Date for PT Re-Evaluation  06/08/18    Authorization Type  [employer]    Authorization Time Period  05/11/18 to 06/08/18    Authorization - Visit Number  3    Authorization - Number of Visits  10    PT Start Time  0935    PT Stop Time  1018    PT Time Calculation (min)  43 min    Activity Tolerance  Patient tolerated treatment well    Behavior During Therapy  [facility] for tasks assessed/performed       Past Medical History:  Diagnosis Date  . Cellulitis of right lower leg 04/04/2013  . Lymphedema of leg 04/08/2013    Past Surgical History:  Procedure Laterality Date  . INNER EAR SURGERY    . ORTHOPEDIC SURGERY      There were no vitals filed for this visit.  Subjective Assessment - 05/28/18 0937    Subjective  Pt states that he is feeling okay. Maybe a little better following last session. His new HEP is much better now that it was updated.     Limitations  Walking    How long can you sit comfortably?  no issues    How long can you stand comfortably?  30-35mins    How long can you walk comfortably?  1 hour    Diagnostic tests  x-rays were negative    Patient Stated Goals  get back to near normal    Currently in Pain?  Yes    Pain Score  4     Pain Location  Back    Pain Orientation  Lower;Right    Pain Descriptors / Indicators  Aching;Dull    Pain Type  Acute pain    Pain Onset  1 to 4 weeks ago    Pain Frequency  Constant    Aggravating Factors   walking long periods, waking up in the mornings    Pain Relieving Factors  heating pad, ice packs, twitsting first thing in  AM    Effect of Pain on Daily Activities  mild increase           [facility] Adult PT Treatment/Exercise - 05/28/18 0001      Lumbar Exercises: Stretches   Hip Flexor Stretch  Right;Left;3 reps;30 seconds    Hip Flexor Stretch Limitations  prone with rope    Other Lumbar Stretch Exercise  bil hip add stretch supine with rope 3x30" each    Other Lumbar Stretch Exercise  fwd, R/L lumbar flexion stretch with large physioball 5x10" each direction      Lumbar Exercises: Seated   LAQ on Chair Limitations  proper breathing with UE ext into table 2x10 reps for 2-3" inhale/exhale    Other Seated Lumbar Exercises  seated thoracic rotation with bolster 10x5" holds    Other Seated Lumbar Exercises  thoracic extension over foam roll x10 reps at mid-thoracic spine      Manual Therapy   Manual Therapy  Soft tissue mobilization    Manual therapy comments  separate rest  of treatment    Soft tissue mobilization  STM to lumbar paraspinals, R>L, especially at L3 due to palpable knot, to reduce pain, restrictions, and improve mobility           PT Education - 05/28/18 0936    Education Details  exercise technique, proper breathing technique to activate core, updated HEP, dry needling risks and benefits    Person(s) Educated  Patient    Methods  Explanation;Demonstration;Handout    Comprehension  Verbalized understanding;Returned demonstration;Verbal cues required;Tactile cues required       PT Short Term Goals - 05/24/18 1633      PT SHORT TERM GOAL #1   Title  Pt will have reduced pain to 4/10 in the mornings on a daily basis in order to demo overall improved function and maximize his ability to perform ADLs with greater ease.     Time  2    Period  Weeks    Status  On-going    Target Date  05/25/18      PT SHORT TERM GOAL #2   Title  Pt will report being able to walk for 2 hours in order to allow him to go grocery shopping with greater ease and with less pain.     Time  2    Period  Weeks     Status  On-going      PT SHORT TERM GOAL #3   Title  Pt will be able to perform bil SLS for 5 sec to demo improved core and functional hip strength in order to maximize gait.    Time  2    Period  Weeks    Status  On-going        PT Long Term Goals - 05/24/18 1633      PT LONG TERM GOAL #1   Title  Pt will report reduced pain to 2/10 or < 3 mornings out of the week or better to demo improved overall function and overall flexibility.     Time  4    Period  Weeks    Status  On-going      PT LONG TERM GOAL #2   Title  Pt will report being able to walk for unlimited amounts of time/distance to demo improved overall core and functional strength and return to PLOF.    Time  4    Period  Weeks    Status  On-going      PT LONG TERM GOAL #3   Title  Pt will be able to perform bil SLS for 10 sec to further demo improved functional strength and balance of bil hips in order to allow him to walk for unlimited distances.     Time  4    Period  Weeks    Status  On-going            Plan - 05/28/18 1019    Clinical Impression Statement  Continued with established POC focusing on overall pain control, mobility, and core strength. Added hip flexor and hip add stretching this date to facilitate glutes and reduce overall pain; pt reported feeling stretches slightly in his back, but PT feels this is due to the mm being so tight and contributing to LBP overall, further supporting rationale for stretching. Also added breathing exercises this date to activate core mm and reduce LBP. Pt requiring cues throughout all of therex for form and proper breathing during exercises. Ended with manual STM to lumbar paraspinals in order to  reduce restrictions, improve mobility, and decrease overall pain. Noted palpable knot at L3 R paraspinals and educated pt on risks and benefits of dry needling to reduce soft tissue restrictions. Pt agreeable to try next visit. No change in pain at EOS; educated pt that he  may be tender from the manual STM but that this was normal and he verbalized understanding. Continue as planned, progressing as able.     Personal Factors and Comorbidities  Age;Comorbidity 1    Examination-Activity Limitations  Bed Mobility;Locomotion Level;Stand    Examination-Participation Restrictions  Community Activity;Shop    Stability/Clinical Decision Making  Stable/Uncomplicated    Rehab Potential  Good    PT Frequency  2x / week    PT Duration  4 weeks    PT Treatment/Interventions  ADLs/Self Care Home Management;Aquatic Therapy;Cryotherapy;Gait training;Stair training;Functional mobility training;Therapeutic activities;Therapeutic exercise;Balance training;Neuromuscular re-education;Patient/family education;Orthotic Fit/Training;Manual techniques;Passive range of motion;Dry needling;Taping;Spinal Manipulations;Ultrasound;Electrical Stimulation    PT Next Visit Plan  continue lumbar stretching/ROM, thoracic mobility, core, hip, functional strengthening, balance training; and manual therapy for joint mobility and soft tissue restrictions     PT Home Exercise Plan  4/23: changed to decompresison 1-5, 4/27: deep breathing with UE ext into table    Consulted and Agree with Plan of Care  Patient       Patient will benefit from skilled therapeutic intervention in order to improve the following deficits and impairments:  Decreased balance, Decreased range of motion, Decreased strength, Difficulty walking, Hypomobility, Increased fascial restricitons, Increased muscle spasms, Impaired flexibility, Improper body mechanics, Postural dysfunction, Pain  Visit Diagnosis: Acute bilateral low back pain without sciatica  Difficulty in walking, not elsewhere classified  Muscle weakness (generalized)     Problem List Patient Active Problem List   Diagnosis Date Noted  . Lymphedema of leg 04/08/2013  . Cellulitis of right lower leg 04/04/2013  . Obesity, unspecified 04/04/2013  . Alcohol  abuse 04/04/2013  . Cellulitis and abscess of leg 04/04/2013       Geraldine Solar PT, DPT    [place] [address] [place], [place], [address] Phone: [phone]   Fax:  [phone]  Name: Raymond Hurst MRN: [id number] Date of Birth: [birth year]

## 2018-05-28 NOTE — Patient Instructions (Signed)

## 2018-05-31 ENCOUNTER — Other Ambulatory Visit: Payer: Self-pay

## 2018-05-31 ENCOUNTER — Encounter (HOSPITAL_COMMUNITY): Payer: Self-pay

## 2018-05-31 ENCOUNTER — Ambulatory Visit (HOSPITAL_COMMUNITY): Payer: Medicare HMO

## 2018-05-31 DIAGNOSIS — R262 Difficulty in walking, not elsewhere classified: Secondary | ICD-10-CM

## 2018-05-31 DIAGNOSIS — M545 Low back pain, unspecified: Secondary | ICD-10-CM

## 2018-05-31 DIAGNOSIS — M6281 Muscle weakness (generalized): Secondary | ICD-10-CM

## 2018-05-31 NOTE — Therapy (Signed)
[place], [place], [address] Phone: [phone]   Fax:  [phone]  Physical Therapy Treatment  Patient Details  Name: Raymond Hurst MRN: [id number] Date of Birth: 1950/10/01 Referring Provider (PT): Suella Broad, MD   Encounter Date: 05/31/2018  PT End of Session - 05/31/18 1531    Visit Number  4    Number of Visits  8    Date for PT Re-Evaluation  06/08/18    Authorization Type  [employer]    Authorization Time Period  05/11/18 to 06/08/18    Authorization - Visit Number  4    Authorization - Number of Visits  10    PT Start Time  0354    PT Stop Time  1610    PT Time Calculation (min)  40 min    Activity Tolerance  Patient tolerated treatment well    Behavior During Therapy  [facility] for tasks assessed/performed       Past Medical History:  Diagnosis Date  . Cellulitis of right lower leg 04/04/2013  . Lymphedema of leg 04/08/2013    Past Surgical History:  Procedure Laterality Date  . INNER EAR SURGERY    . ORTHOPEDIC SURGERY      There were no vitals filed for this visit.  Subjective Assessment - 05/31/18 1531    Subjective  Pt states that he feels like he is a little bit better overall. States he did a lot of walking and standing yesterday and his back was bothering him afterwards bu overall, he does feel better.     Limitations  Walking    How long can you sit comfortably?  no issues    How long can you stand comfortably?  30-33mins    How long can you walk comfortably?  1 hour    Diagnostic tests  x-rays were negative    Patient Stated Goals  get back to near normal    Currently in Pain?  Yes    Pain Score  3     Pain Location  Back    Pain Orientation  Lower;Right    Pain Descriptors / Indicators  Aching;Dull    Pain Type  Acute pain    Pain Onset  1 to 4 weeks ago    Pain Frequency  Constant    Aggravating Factors   walking long periods, waking up in the mornings    Pain Relieving  Factors  heating pad, ice packs, twisting first thing in AM    Effect of Pain on Daily Activities  mild increase          [facility] Adult PT Treatment/Exercise - 05/31/18 0001      Lumbar Exercises: Stretches   Prone Mid Back Stretch Limitations  seated child's pose with palsm up 3x30"      Lumbar Exercises: Standing   Other Standing Lumbar Exercises  bil staggered stance front foot elevated on 4" step +paloff press GTB x15 reps each      Modalities   Modalities  Moist Heat      Moist Heat Therapy   Number Minutes Moist Heat  6 Minutes    Moist Heat Location  Lumbar Spine   after needling to reduce pain and post-needling soreness     Manual Therapy   Manual Therapy  Soft tissue mobilization;Joint mobilization    Manual therapy comments  separate rest of treatment    Joint Mobilization  CPAs to T12-L4 to reduce  pain and improve mobility    Soft tissue mobilization  STM after needling to R lumbar paraspinals, especially at L3 due to palpable knot, to reduce pain, restrictions, and improve mobility       Trigger Point Dry Needling - 05/31/18 0001    Consent Given?  Yes    Education Handout Provided  Previously provided    Muscles Treated Back/Hip  Lumbar multifidi    Lumbar multifidi Response  Twitch response elicited;Palpable increased muscle length   R sided L2-4             PT Education - 05/31/18 1616    Education Details  dry needling soreness following; exercise technique, continue HEP    Person(s) Educated  Patient    Methods  Explanation;Demonstration    Comprehension  Verbalized understanding       PT Short Term Goals - 05/24/18 1633      PT SHORT TERM GOAL #1   Title  Pt will have reduced pain to 4/10 in the mornings on a daily basis in order to demo overall improved function and maximize his ability to perform ADLs with greater ease.     Time  2    Period  Weeks    Status  On-going    Target Date  05/25/18      PT SHORT TERM GOAL #2   Title  Pt will  report being able to walk for 2 hours in order to allow him to go grocery shopping with greater ease and with less pain.     Time  2    Period  Weeks    Status  On-going      PT SHORT TERM GOAL #3   Title  Pt will be able to perform bil SLS for 5 sec to demo improved core and functional hip strength in order to maximize gait.    Time  2    Period  Weeks    Status  On-going        PT Long Term Goals - 05/24/18 1633      PT LONG TERM GOAL #1   Title  Pt will report reduced pain to 2/10 or < 3 mornings out of the week or better to demo improved overall function and overall flexibility.     Time  4    Period  Weeks    Status  On-going      PT LONG TERM GOAL #2   Title  Pt will report being able to walk for unlimited amounts of time/distance to demo improved overall core and functional strength and return to PLOF.    Time  4    Period  Weeks    Status  On-going      PT LONG TERM GOAL #3   Title  Pt will be able to perform bil SLS for 10 sec to further demo improved functional strength and balance of bil hips in order to allow him to walk for unlimited distances.     Time  4    Period  Weeks    Status  On-going            Plan - 05/31/18 1623    Clinical Impression Statement  Initiated trigger point dry needling to R lumbar multifidus this date. Performed to L2-4, with emphasis at L3. Pt tolerated well. Followed up with manual STM and joint mobs to paraspinals and lumbar spine to facilitate reduced pain and improved overall mobility. Applied moist heat pack  x6 mins to further promote blood flow to the area to facilitate reduced pain, improved blood flow, and promote tissue healing. Ended with seated child's pose for stretching and standing core activation. Pt reported some soreness at EOS but not increased pain. Continue as planned, progressing as able.     Personal Factors and Comorbidities  Age;Comorbidity 1    Examination-Activity Limitations  Bed Mobility;Locomotion  Level;Stand    Examination-Participation Restrictions  Community Activity;Shop    Stability/Clinical Decision Making  Stable/Uncomplicated    Rehab Potential  Good    PT Frequency  2x / week    PT Duration  4 weeks    PT Treatment/Interventions  ADLs/Self Care Home Management;Aquatic Therapy;Cryotherapy;Gait training;Stair training;Functional mobility training;Therapeutic activities;Therapeutic exercise;Balance training;Neuromuscular re-education;Patient/family education;Orthotic Fit/Training;Manual techniques;Passive range of motion;Dry needling;Taping;Spinal Manipulations;Ultrasound;Electrical Stimulation    PT Next Visit Plan  f/u on dry needling; continue lumbar stretching/ROM, thoracic mobility, core, hip, functional strengthening, balance training; and manual therapy for joint mobility and soft tissue restrictions     PT Home Exercise Plan  4/23: changed to decompresison 1-5, 4/27: deep breathing with UE ext into table    Consulted and Agree with Plan of Care  Patient       Patient will benefit from skilled therapeutic intervention in order to improve the following deficits and impairments:  Decreased balance, Decreased range of motion, Decreased strength, Difficulty walking, Hypomobility, Increased fascial restricitons, Increased muscle spasms, Impaired flexibility, Improper body mechanics, Postural dysfunction, Pain  Visit Diagnosis: Acute bilateral low back pain without sciatica  Difficulty in walking, not elsewhere classified  Muscle weakness (generalized)     Problem List Patient Active Problem List   Diagnosis Date Noted  . Lymphedema of leg 04/08/2013  . Cellulitis of right lower leg 04/04/2013  . Obesity, unspecified 04/04/2013  . Alcohol abuse 04/04/2013  . Cellulitis and abscess of leg 04/04/2013       Geraldine Solar PT, DPT   [place] [address] [place], [place], [address] Phone: [phone]   Fax:   [phone]  Name: Raymond Hurst MRN: [id number] Date of Birth: [birth year]

## 2018-06-04 ENCOUNTER — Encounter (HOSPITAL_COMMUNITY): Payer: Self-pay

## 2018-06-04 ENCOUNTER — Ambulatory Visit (HOSPITAL_COMMUNITY): Payer: Medicare HMO | Attending: Physical Medicine and Rehabilitation

## 2018-06-04 ENCOUNTER — Ambulatory Visit (HOSPITAL_COMMUNITY): Payer: Medicare HMO

## 2018-06-04 ENCOUNTER — Other Ambulatory Visit: Payer: Self-pay

## 2018-06-04 DIAGNOSIS — M545 Low back pain, unspecified: Secondary | ICD-10-CM

## 2018-06-04 DIAGNOSIS — R262 Difficulty in walking, not elsewhere classified: Secondary | ICD-10-CM

## 2018-06-04 DIAGNOSIS — M6281 Muscle weakness (generalized): Secondary | ICD-10-CM | POA: Insufficient documentation

## 2018-06-04 NOTE — Therapy (Signed)
[place], [place], [address] Phone: [phone]   Fax:  [phone]  Physical Therapy Treatment  Patient Details  Name: Raymond Hurst MRN: [id number] Date of Birth: 29-Jul-1950 Referring Provider (PT): Suella Broad, MD   Encounter Date: 06/04/2018  PT End of Session - 06/04/18 1434    Visit Number  5    Number of Visits  8    Date for PT Re-Evaluation  06/08/18    Authorization Type  [employer]    Authorization Time Period  05/11/18 to 06/08/18    Authorization - Visit Number  5    Authorization - Number of Visits  10    PT Start Time  6720    PT Stop Time  1515    PT Time Calculation (min)  40 min    Activity Tolerance  Patient tolerated treatment well    Behavior During Therapy  [facility] for tasks assessed/performed       Past Medical History:  Diagnosis Date  . Cellulitis of right lower leg 04/04/2013  . Lymphedema of leg 04/08/2013    Past Surgical History:  Procedure Laterality Date  . INNER EAR SURGERY    . ORTHOPEDIC SURGERY      There were no vitals filed for this visit.  Subjective Assessment - 06/04/18 1435    Subjective  Pt states that he has had better days. Thinks he may have a kidney infection because he feels like the pain has moved up. He states that every time he lays down at night it is pretty bad. He said that he felt okay the next day following the neelding but after thay it has just still been aching.     Limitations  Walking    How long can you sit comfortably?  no issues    How long can you stand comfortably?  30-16mins    How long can you walk comfortably?  1 hour    Diagnostic tests  x-rays were negative    Patient Stated Goals  get back to near normal    Currently in Pain?  Yes    Pain Score  5     Pain Location  Back    Pain Orientation  Lower;Right    Pain Descriptors / Indicators  Aching;Dull    Pain Type  Acute pain    Pain Onset  1 to 4 weeks ago    Pain Frequency   Constant    Aggravating Factors   walking long periods, waking up in the mornings    Pain Relieving Factors  heating pad, ice packs, twisting first thing in AM    Effect of Pain on Daily Activities  mild increase         [facility] PT Assessment - 06/04/18 0001      Special Tests    Special Tests  Lumbar    Lumbar Tests  other      other   Findings  Positive    Side   Right    Comments  percussion kidney test mildly positive as pt reported recreation of pain, but palpation to lumbar paraspinals recreated same pain and pt has difficulty describing/explaining symptoms           [facility] Adult PT Treatment/Exercise - 06/04/18 0001      Lumbar Exercises: Stretches   Single Knee to Chest Stretch  Right;Left;2 reps;30 seconds    Single Knee to Chest Stretch Limitations  hooklying with  sheet    Lower Trunk Rotation Limitations  10x5" holds BLE    Other Lumbar Stretch Exercise  bil manual hip add stretching 2x30" each      Lumbar Exercises: Supine   Bridge  10 reps    Bridge Limitations  x5 with ankle DF, x5 with feet elevated on 12" step       Manual Therapy   Manual Therapy  Soft tissue mobilization    Manual therapy comments  separate rest of treatment    Soft tissue mobilization  STM to R lumbar paraspinals with pt in L sidelying to reduce pain             PT Education - 06/04/18 1435    Education Details  exercise technique, continue HEP, will reassess next visit    Person(s) Educated  Patient    Methods  Explanation;Demonstration    Comprehension  Verbalized understanding;Returned demonstration       PT Short Term Goals - 05/24/18 1633      PT SHORT TERM GOAL #1   Title  Pt will have reduced pain to 4/10 in the mornings on a daily basis in order to demo overall improved function and maximize his ability to perform ADLs with greater ease.     Time  2    Period  Weeks    Status  On-going    Target Date  05/25/18      PT SHORT TERM GOAL #2   Title  Pt will report  being able to walk for 2 hours in order to allow him to go grocery shopping with greater ease and with less pain.     Time  2    Period  Weeks    Status  On-going      PT SHORT TERM GOAL #3   Title  Pt will be able to perform bil SLS for 5 sec to demo improved core and functional hip strength in order to maximize gait.    Time  2    Period  Weeks    Status  On-going        PT Long Term Goals - 05/24/18 1633      PT LONG TERM GOAL #1   Title  Pt will report reduced pain to 2/10 or < 3 mornings out of the week or better to demo improved overall function and overall flexibility.     Time  4    Period  Weeks    Status  On-going      PT LONG TERM GOAL #2   Title  Pt will report being able to walk for unlimited amounts of time/distance to demo improved overall core and functional strength and return to PLOF.    Time  4    Period  Weeks    Status  On-going      PT LONG TERM GOAL #3   Title  Pt will be able to perform bil SLS for 10 sec to further demo improved functional strength and balance of bil hips in order to allow him to walk for unlimited distances.     Time  4    Period  Weeks    Status  On-going            Plan - 06/04/18 1520    Clinical Impression Statement  Pt presents with continued and slight increase reports in R sided LBP. States that he has had a rough few days over the weekend; not sure if it  is a side effect from the needling performed during last visit or if it is just minor flare up of pt's c/c. Regressed today's session to include more lumbar stretching and manual therapy to facilitated reducing low back pain. Attempted to facilitated gluteal strength/activation with bridges, but pt had increased pain with bridges even with modifications so only performed 10 reps. Ended with manual STM with pt in sidelying. Pt reported slightly reduced pain at EOS. Encouraged pt to f/u with MD regarding potential kidney infection; PT performed percussion test and it was  mildly positive as he did report slight recreation of pain but pt also has difficulty expressing/describing symptoms. Pt due for reassessment next visit.     Personal Factors and Comorbidities  Age;Comorbidity 1    Examination-Activity Limitations  Bed Mobility;Locomotion Level;Stand    Examination-Participation Restrictions  Community Activity;Shop    Stability/Clinical Decision Making  Stable/Uncomplicated    Rehab Potential  Good    PT Frequency  2x / week    PT Duration  4 weeks    PT Treatment/Interventions  ADLs/Self Care Home Management;Aquatic Therapy;Cryotherapy;Gait training;Stair training;Functional mobility training;Therapeutic activities;Therapeutic exercise;Balance training;Neuromuscular re-education;Patient/family education;Orthotic Fit/Training;Manual techniques;Passive range of motion;Dry needling;Taping;Spinal Manipulations;Ultrasound;Electrical Stimulation    PT Next Visit Plan  reassessment; continue lumbar stretching/ROM, thoracic mobility, core, hip, functional strengthening, balance training; and manual therapy for joint mobility and soft tissue restrictions     PT Home Exercise Plan  4/23: changed to decompresison 1-5, 4/27: deep breathing with UE ext into table    Consulted and Agree with Plan of Care  Patient       Patient will benefit from skilled therapeutic intervention in order to improve the following deficits and impairments:  Decreased balance, Decreased range of motion, Decreased strength, Difficulty walking, Hypomobility, Increased fascial restricitons, Increased muscle spasms, Impaired flexibility, Improper body mechanics, Postural dysfunction, Pain  Visit Diagnosis: Acute bilateral low back pain without sciatica  Difficulty in walking, not elsewhere classified  Muscle weakness (generalized)     Problem List Patient Active Problem List   Diagnosis Date Noted  . Lymphedema of leg 04/08/2013  . Cellulitis of right lower leg 04/04/2013  . Obesity,  unspecified 04/04/2013  . Alcohol abuse 04/04/2013  . Cellulitis and abscess of leg 04/04/2013      Geraldine Solar PT, DPT   [place] [address] [place], [place], [address] Phone: [phone]   Fax:  [phone]  Name: MELQUISEDEC JOURNEY MRN: [id number] Date of Birth: Dec 31, 1950

## 2018-06-07 ENCOUNTER — Emergency Department (HOSPITAL_COMMUNITY): Payer: Medicare HMO

## 2018-06-07 ENCOUNTER — Observation Stay (HOSPITAL_COMMUNITY)
Admission: EM | Admit: 2018-06-07 | Discharge: 2018-06-08 | Disposition: A | Discharge status: Home / Self Care | Payer: Medicare HMO | Attending: Internal Medicine | Admitting: Internal Medicine

## 2018-06-07 ENCOUNTER — Other Ambulatory Visit: Payer: Self-pay

## 2018-06-07 ENCOUNTER — Encounter (HOSPITAL_COMMUNITY): Payer: Self-pay | Admitting: Emergency Medicine

## 2018-06-07 ENCOUNTER — Ambulatory Visit (HOSPITAL_COMMUNITY): Payer: Medicare HMO

## 2018-06-07 ENCOUNTER — Encounter (HOSPITAL_COMMUNITY): Payer: Self-pay

## 2018-06-07 DIAGNOSIS — Z87891 Personal history of nicotine dependence: Secondary | ICD-10-CM | POA: Insufficient documentation

## 2018-06-07 DIAGNOSIS — R42 Dizziness and giddiness: Secondary | ICD-10-CM

## 2018-06-07 DIAGNOSIS — R55 Syncope and collapse: Secondary | ICD-10-CM | POA: Diagnosis present

## 2018-06-07 DIAGNOSIS — E876 Hypokalemia: Secondary | ICD-10-CM | POA: Diagnosis present

## 2018-06-07 DIAGNOSIS — G459 Transient cerebral ischemic attack, unspecified: Secondary | ICD-10-CM | POA: Diagnosis not present

## 2018-06-07 DIAGNOSIS — Z1159 Encounter for screening for other viral diseases: Secondary | ICD-10-CM | POA: Insufficient documentation

## 2018-06-07 DIAGNOSIS — I517 Cardiomegaly: Secondary | ICD-10-CM | POA: Diagnosis not present

## 2018-06-07 DIAGNOSIS — M6281 Muscle weakness (generalized): Secondary | ICD-10-CM | POA: Diagnosis not present

## 2018-06-07 DIAGNOSIS — M545 Low back pain, unspecified: Secondary | ICD-10-CM

## 2018-06-07 DIAGNOSIS — I890 Lymphedema, not elsewhere classified: Secondary | ICD-10-CM

## 2018-06-07 DIAGNOSIS — R262 Difficulty in walking, not elsewhere classified: Secondary | ICD-10-CM

## 2018-06-07 DIAGNOSIS — I480 Paroxysmal atrial fibrillation: Secondary | ICD-10-CM | POA: Diagnosis present

## 2018-06-07 DIAGNOSIS — R0902 Hypoxemia: Secondary | ICD-10-CM | POA: Diagnosis not present

## 2018-06-07 DIAGNOSIS — I499 Cardiac arrhythmia, unspecified: Secondary | ICD-10-CM | POA: Diagnosis not present

## 2018-06-07 DIAGNOSIS — R0602 Shortness of breath: Secondary | ICD-10-CM | POA: Diagnosis not present

## 2018-06-07 DIAGNOSIS — I4891 Unspecified atrial fibrillation: Secondary | ICD-10-CM | POA: Diagnosis not present

## 2018-06-07 LAB — CBC WITH DIFFERENTIAL/PLATELET
Abs Immature Granulocytes: 0.04 10*3/uL (ref 0.00–0.07)
Basophils Absolute: 0 10*3/uL (ref 0.0–0.1)
Basophils Relative: 0 %
Eosinophils Absolute: 0 10*3/uL (ref 0.0–0.5)
Eosinophils Relative: 0 %
HCT: 41.8 % (ref 39.0–52.0)
Hemoglobin: 14.2 g/dL (ref 13.0–17.0)
Immature Granulocytes: 0 %
Lymphocytes Relative: 6 %
Lymphs Abs: 0.7 10*3/uL (ref 0.7–4.0)
MCH: 32.8 pg (ref 26.0–34.0)
MCHC: 34 g/dL (ref 30.0–36.0)
MCV: 96.5 fL (ref 80.0–100.0)
Monocytes Absolute: 0.9 10*3/uL (ref 0.1–1.0)
Monocytes Relative: 8 %
Neutro Abs: 9.6 10*3/uL — ABNORMAL HIGH (ref 1.7–7.7)
Neutrophils Relative %: 86 %
Platelets: 181 10*3/uL (ref 150–400)
RBC: 4.33 MIL/uL (ref 4.22–5.81)
RDW: 12.3 % (ref 11.5–15.5)
WBC: 11.2 10*3/uL — ABNORMAL HIGH (ref 4.0–10.5)
nRBC: 0 % (ref 0.0–0.2)

## 2018-06-07 LAB — BASIC METABOLIC PANEL
Anion gap: 12 (ref 5–15)
BUN: 14 mg/dL (ref 8–23)
CO2: 23 mmol/L (ref 22–32)
Calcium: 9.1 mg/dL (ref 8.9–10.3)
Chloride: 103 mmol/L (ref 98–111)
Creatinine, Ser: 1.03 mg/dL (ref 0.61–1.24)
GFR calc Af Amer: >60 mL/min (ref >60)
GFR calc non Af Amer: >60 mL/min (ref >60)
Glucose, Bld: 125 mg/dL — ABNORMAL HIGH (ref 70–99)
Potassium: 3.3 mmol/L — ABNORMAL LOW (ref 3.5–5.1)
Sodium: 138 mmol/L (ref 135–145)

## 2018-06-07 LAB — TROPONIN I: Troponin I: <0.03 ng/mL (ref <0.03)

## 2018-06-07 MED ORDER — ASPIRIN 81 MG PO CHEW
324.0000 mg | CHEWABLE_TABLET | Freq: Once | ORAL | Status: AC
  Administered 2018-06-08: 324 mg via ORAL
  Filled 2018-06-07: qty 4

## 2018-06-07 MED ORDER — SODIUM CHLORIDE 0.9 % IV BOLUS
500.0000 mL | Freq: Once | INTRAVENOUS | Status: AC
  Administered 2018-06-07: 500 mL via INTRAVENOUS

## 2018-06-07 MED ORDER — POTASSIUM CHLORIDE CRYS ER 20 MEQ PO TBCR
20.0000 meq | EXTENDED_RELEASE_TABLET | Freq: Once | ORAL | Status: AC
  Administered 2018-06-07: 20 meq via ORAL
  Filled 2018-06-07: qty 1

## 2018-06-07 MED ORDER — METOPROLOL TARTRATE 5 MG/5ML IV SOLN
2.5000 mg | Freq: Once | INTRAVENOUS | Status: AC
  Administered 2018-06-08: 2.5 mg via INTRAVENOUS
  Filled 2018-06-07: qty 5

## 2018-06-07 NOTE — Therapy (Signed)
[place], [place], [address] Phone: [phone]   Fax:  [phone]   Progress Note Reporting Period 05/11/18 to 06/07/18  See note below for Objective Data and Assessment of Progress/Goals.   Physical Therapy Treatment  Patient Details  Name: THANH MOTTERN MRN: [id number] Date of Birth: Sep 17, 1950 Referring Provider (PT): Suella Broad, MD   Encounter Date: 06/07/2018  PT End of Session - 06/07/18 1533    Visit Number  6    Number of Visits  10    Date for PT Re-Evaluation  06/21/18    Authorization Type  [employer]    Authorization Time Period  05/11/18 to 06/08/18; NEW: 06/07/18 to 06/21/18    Authorization - Visit Number  6    Authorization - Number of Visits  10    PT Start Time  1532    PT Stop Time  1615    PT Time Calculation (min)  43 min    Activity Tolerance  Patient tolerated treatment well    Behavior During Therapy  [facility] for tasks assessed/performed       Past Medical History:  Diagnosis Date  . Cellulitis of right lower leg 04/04/2013  . Lymphedema of leg 04/08/2013    Past Surgical History:  Procedure Laterality Date  . INNER EAR SURGERY    . ORTHOPEDIC SURGERY      There were no vitals filed for this visit.  Subjective Assessment - 06/07/18 1533    Subjective  Pt reports that he did a lot of running around yesterday in [place], etc. He said that he was sitting down and jumped up to grab the phone ringing and states that his back caught. Reports that he sat back down and rested for a few mins and it eased off.     Limitations  Walking    How long can you sit comfortably?  no issues    How long can you stand comfortably?  30-68mns    How long can you walk comfortably?  1 hour    Diagnostic tests  x-rays were negative    Patient Stated Goals  get back to near normal    Currently in Pain?  Yes    Pain Score  6     Pain Location  Back    Pain Orientation  Lower;Right    Pain  Descriptors / Indicators  Aching;Dull    Pain Type  Acute pain    Pain Onset  1 to 4 weeks ago    Pain Frequency  Constant    Aggravating Factors   walking long periods, waking up in the mornings    Pain Relieving Factors  heating pad, ice packs, twisting first thing in AM    Effect of Pain on Daily Activities  mild increase         [facility] PT Assessment - 06/07/18 0001      Assessment   Medical Diagnosis  degeneration of lumbar intervertebral disc, other degeneration of intervertebral disc, lumbar region    Referring Provider (PT)  RSuella Broad MD    Onset Date/Surgical Date  04/12/18    Next MD Visit  nothing scheduled right now    Prior Therapy  none for present issue, chiropractic work for this      Balance   Balance Assessed  Yes      Static Standing Balance   Static Standing - Balance Support  No upper extremity supported  Static Standing Balance -  Activities   Single Leg Stance - Right Leg;Single Leg Stance - Left Leg    Static Standing - Comment/# of Minutes  R: 2.45 sec or < L: 2 sec or <   was 1.5sec or < BLE          [facility] Adult PT Treatment/Exercise - 06/07/18 0001      Lumbar Exercises: Stretches   Quadruped Mid Back Stretch Limitations  pec stretch in corner 3x30"    Other Lumbar Stretch Exercise  seated QL stretch with UE abd 5x15" each      Lumbar Exercises: Standing   Shoulder Extension Limitations  bent over hip ext 2x10 BLE (to reduce lower back compensation and isolate glutes)    Shoulder Adduction Limitations  bil hip abd RTB 2x10 reps    Other Standing Lumbar Exercises  sidestep RTB 26f x2RT      Manual Therapy   Manual Therapy  Soft tissue mobilization;Manual Traction    Manual therapy comments  separate rest of treatment    Soft tissue mobilization  STM to R lumbar paraspinals, QL to reduce restirctions and pain    Manual Traction  R hip long axis distraction for QL stretch 5x10-15" bouts            PT Education - 06/07/18 1533     Education Details  exercise technique, continue HEP    Person(s) Educated  Patient    Methods  Explanation;Demonstration    Comprehension  Verbalized understanding;Returned demonstration       PT Short Term Goals - 06/07/18 1536      PT SHORT TERM GOAL #1   Title  Pt will have reduced pain to 4/10 in the mornings on a daily basis in order to demo overall improved function and maximize his ability to perform ADLs with greater ease.     Baseline  5/7: 5/10 LBP in the mornings    Time  2    Period  Weeks    Status  On-going    Target Date  05/25/18      PT SHORT TERM GOAL #2   Title  Pt will report being able to walk for 2 hours in order to allow him to go grocery shopping with greater ease and with less pain.     Baseline  5/7: averages 45-664ms    Time  2    Period  Weeks    Status  On-going      PT SHORT TERM GOAL #3   Title  Pt will be able to perform bil SLS for 5 sec to demo improved core and functional hip strength in order to maximize gait.    Baseline  5/7: 2.45sec on R, 2sec on L    Time  2    Period  Weeks    Status  On-going        PT Long Term Goals - 06/07/18 1537      PT LONG TERM GOAL #1   Title  Pt will report reduced pain to 2/10 or < 3 mornings out of the week or better to demo improved overall function and overall flexibility.     Baseline  5/7: 5/10 LBP in the mornings    Time  4    Period  Weeks    Status  On-going      PT LONG TERM GOAL #2   Title  Pt will report being able to walk for unlimited amounts of time/distance  to demo improved overall core and functional strength and return to PLOF.    Baseline  5/7: averages 45-14mns    Time  4    Period  Weeks    Status  On-going      PT LONG TERM GOAL #3   Title  Pt will be able to perform bil SLS for 10 sec to further demo improved functional strength and balance of bil hips in order to allow him to walk for unlimited distances.     Baseline  5/7: 2.45sec on R, 2sec on L    Time  4    Period   Weeks    Status  On-going            Plan - 06/07/18 1620    Clinical Impression Statement  Mini reassessment performed this date. Pt has made some progress towards goals but has not yet met any STG or LTG. Pt reporting continued pain with activity, walking, and when he first wakes up in the mornings, which has only slightly improved as illustrated above. Pt did state that he has noticed that he can bend over and pick things up off the floor easier than before, indicating functional improvements. PT recommending brief extension of therapy services to continue to assess and address contributing factors in attempt to reduce his pain and promote return to PLOF; pt agreeable to 2x/week for 2 more weeks. Following that, POC will be determined at that point. Rest of session focused on gluteal activation, mobility, and reducing pain. Introduced pt to long axis distraction or R hip to stretch R QL. Pt not reporting much change at EOS.    Personal Factors and Comorbidities  Age;Comorbidity 1    Examination-Activity Limitations  Bed Mobility;Locomotion Level;Stand    Examination-Participation Restrictions  Community Activity;Shop    Stability/Clinical Decision Making  Stable/Uncomplicated    Rehab Potential  Good    PT Frequency  2x / week    PT Duration  2 weeks    PT Treatment/Interventions  ADLs/Self Care Home Management;Aquatic Therapy;Cryotherapy;Gait training;Stair training;Functional mobility training;Therapeutic activities;Therapeutic exercise;Balance training;Neuromuscular re-education;Patient/family education;Orthotic Fit/Training;Manual techniques;Passive range of motion;Dry needling;Taping;Spinal Manipulations;Ultrasound;Electrical Stimulation    PT Next Visit Plan  continue QL stretching, hip distraction; continue lumbar stretching/ROM, thoracic mobility, core, hip, functional strengthening, balance training; and manual therapy for joint mobility and soft tissue restrictions     PT Home  Exercise Plan  4/23: changed to decompresison 1-5, 4/27: deep breathing with UE ext into table    Consulted and Agree with Plan of Care  Patient       Patient will benefit from skilled therapeutic intervention in order to improve the following deficits and impairments:  Decreased balance, Decreased range of motion, Decreased strength, Difficulty walking, Hypomobility, Increased fascial restricitons, Increased muscle spasms, Impaired flexibility, Improper body mechanics, Postural dysfunction, Pain  Visit Diagnosis: Acute bilateral low back pain without sciatica - Plan: PT plan of care cert/re-cert  Difficulty in walking, not elsewhere classified - Plan: PT plan of care cert/re-cert  Muscle weakness (generalized) - Plan: PT plan of care cert/re-cert     Problem List Patient Active Problem List   Diagnosis Date Noted  . Lymphedema of leg 04/08/2013  . Cellulitis of right lower leg 04/04/2013  . Obesity, unspecified 04/04/2013  . Alcohol abuse 04/04/2013  . Cellulitis and abscess of leg 04/04/2013        BGeraldine SolarPT, DPT   [email or web address]  [place], [address] Phone: [phone]   Fax:  [phone]  Name: DONAVON KIMREY MRN: [id number] Date of Birth: May 31, 1950

## 2018-06-07 NOTE — ED Provider Notes (Signed)
Care assumed from Dr. Melina Copa.  Patient with history of obesity, peripheral edema presenting with near syncope with episodes of dizziness and lightheadedness x2 with diaphoresis.  No chest pain or shortness of breath.  Found to have questionable atrial fibrillation with PACs.  EKGs in the ED does show some P waves but are very irregular.  Patient denies any history of atrial fibrillation.  No chest pain or shortness of breath.  On telemetry patient has irregular rhythm in the 90s to 100s that jumps to 130s when he stands up.  It is mostly irregular this time P waves are seen. Low dose beta blocker given.  Suspect new onset underlying atrial fibrillation may be etiology of his new episodes of near syncope.  Feel he would benefit from observation admission for arrhythmia given recurrent episodes.  Discussed with Dr. Maudie Mercury.   EKG Interpretation  Date/Time:  Thursday Jun 07 2018 23:13:16 EDT Ventricular Rate:  96 PR Interval:    QRS Duration: 101 QT Interval:  367 QTC Calculation: 464 R Axis:   -57 Text Interpretation:  Ectopic atrial rhythm Supraventricular bigeminy Left anterior fascicular block Abnormal R-wave progression, late transition Probable left ventricular hypertrophy No significant change was found Confirmed by Ezequiel Essex [phone]) on 06/07/2018 11:16:01 PM         , Annie Main, MD 06/08/18 0300

## 2018-06-07 NOTE — ED Notes (Signed)
Patient's pulse rate increased to 132 upon standing.

## 2018-06-07 NOTE — ED Notes (Signed)
Patient drinking oral fluids. Patient drank 1 cup of ginger-ale and 1 cup of water.

## 2018-06-07 NOTE — H&P (Signed)
TRH H&P    Patient Demographics:    Raymond Hurst, is a 68 y.o. male  MRN: [id number]  DOB - 1950/10/14  Admit Date - 06/07/2018  Referring MD/NP/PA:  Ezequiel Essex  Outpatient Primary MD for the patient is Patient, No Pcp Per  Patient coming from:   home  Chief complaint-  Vision loss right eye , dizziness, palpitations   HPI:    Raymond Hurst  is a 68 y.o. male, w etoh abuse, (as much as 1 bottle of wine per day), chronic back pain, apparently presents with c/o dizziness and palpitations starting about 4:30pm lasting for a few minutes, associated with vision loss right eye x 54minute.  Pt denies headache, slurred speech, cp, sob, n/v, diarrhea, brbpr, dysuria, focal neurological weakness, numbness, tingling. Pt states that symptoms of dizziness and palpitations occurred about 30-60 minutes later this concerned him and so he presented to the ED. Pt denies any recent coronavirus contact, no recent travel.  Pt has had prior episodes of dizziness but is vague about when they have occurred. En route apparently in Afib   In ED,  T 97.5  P 94 R 25, Bp 149/82 pox 94-98% on RA  CT brain IMPRESSION: Global atrophy.  No acute intracranial abnormality.  Wbc 11.2, Hgb 14.2, Plt 181 Na 138 K 3.3 Glucose 125 Bun 14, Creatinine 1.03 BNP 43  Trop <0.03 Pt outside of TPA window, and symptoms have resolved and thus was not a TPA candidate  Pt will be admitted for TIA (right eye vision loss) x 1 minute, as well as ? Afib.    Review of systems:    In addition to the HPI above,  No Fever-chills, No Headache, No changes with  hearing, No problems swallowing food or Liquids, No Chest pain, No Cough or Shortness of Breath, No Abdominal pain, No Nausea or Vomiting, bowel movements are regular, No Blood in stool or Urine, No dysuria, No new skin rashes or bruises, No new joints pains-aches,  No new weakness,  tingling, numbness in any extremity, No recent weight gain or loss, No polyuria, polydypsia or polyphagia, No significant Mental Stressors.  All other systems reviewed and are negative.    Past History of the following :    Past Medical History:  Diagnosis Date  . Cellulitis of right lower leg 04/04/2013  . Lymphedema of leg 04/08/2013      Past Surgical History:  Procedure Laterality Date  . INNER EAR SURGERY    . ORTHOPEDIC SURGERY        Social History:      Social History   Tobacco Use  . Smoking status: Former Research scientist (life sciences)  . Smokeless tobacco: Never Used  Substance Use Topics  . Alcohol use: Yes    Comment: 4-5 beer daily       Family History :     Family History  Problem Relation Age of Onset  . Arrhythmia Mother   . Pulmonary fibrosis Father        Home Medications:   Prior to Admission  medications   Medication Sig Start Date End Date Taking? Authorizing Provider  doxycycline (VIBRA-TABS) 100 MG tablet Take 1 tablet (100 mg total) by mouth every 12 (twelve) hours. Antibiotic. 04/08/13   Rexene Alberts, MD     Allergies:    No Known Allergies   Physical Exam:   Vitals  Blood pressure (!) 140/91, pulse 72, temperature (!) 97.5 F (36.4 C), temperature source Oral, resp. rate 14, height 6\' 1"  (1.854 m), weight 130.6 kg, SpO2 97 %.  1.  General: axox3  2. Psychiatric: euthymic  3. Neurologic: cn2-12 intact, reflexes 2+ symmetric, diffuse with no clonus, motor 5/5 in all 4 ext Vision intact  4. HEENMT:  Anicteric, pupils 1.73mm symmetric, direct, consensual, near intact Mmm Neck: no jvd, no bruit, no tm  5. Respiratory : CTAB  6. Cardiovascular : Irr, irr, s1, s2,   7. Gastrointestinal:  Abd: obese, soft, nt, nd, +bs  8. Skin:  Ext: no c/c, trace-1+ edema,  No rash  9.Musculoskeletal:  Good ROM  No adenopathy    Data Review:    CBC Recent Labs  Lab 06/07/18 2131  WBC 11.2*  HGB 14.2  HCT 41.8  PLT 181  MCV 96.5  MCH  32.8  MCHC 34.0  RDW 12.3  LYMPHSABS 0.7  MONOABS 0.9  EOSABS 0.0  BASOSABS 0.0   ------------------------------------------------------------------------------------------------------------------  Results for orders placed or performed during the hospital encounter of 06/07/18 (from the past 48 hour(s))  Basic metabolic panel     Status: Abnormal   Collection Time: 06/07/18  9:31 PM  Result Value Ref Range   Sodium 138 135 - 145 mmol/L   Potassium 3.3 (L) 3.5 - 5.1 mmol/L   Chloride 103 98 - 111 mmol/L   CO2 23 22 - 32 mmol/L   Glucose, Bld 125 (H) 70 - 99 mg/dL   BUN 14 8 - 23 mg/dL   Creatinine, Ser 1.03 0.61 - 1.24 mg/dL   Calcium 9.1 8.9 - 10.3 mg/dL   GFR calc non Af Amer >60 >60 mL/min   GFR calc Af Amer >60 >60 mL/min   Anion gap 12 5 - 15    Comment: Performed at [facility], [address] [place], [place] [address]  CBC with Differential     Status: Abnormal   Collection Time: 06/07/18  9:31 PM  Result Value Ref Range   WBC 11.2 (H) 4.0 - 10.5 K/uL   RBC 4.33 4.22 - 5.81 MIL/uL   Hemoglobin 14.2 13.0 - 17.0 g/dL   HCT 41.8 39.0 - 52.0 %   MCV 96.5 80.0 - 100.0 fL   MCH 32.8 26.0 - 34.0 pg   MCHC 34.0 30.0 - 36.0 g/dL   RDW 12.3 11.5 - 15.5 %   Platelets 181 150 - 400 K/uL   nRBC 0.0 0.0 - 0.2 %   Neutrophils Relative % 86 %   Neutro Abs 9.6 (H) 1.7 - 7.7 K/uL   Lymphocytes Relative 6 %   Lymphs Abs 0.7 0.7 - 4.0 K/uL   Monocytes Relative 8 %   Monocytes Absolute 0.9 0.1 - 1.0 K/uL   Eosinophils Relative 0 %   Eosinophils Absolute 0.0 0.0 - 0.5 K/uL   Basophils Relative 0 %   Basophils Absolute 0.0 0.0 - 0.1 K/uL   Immature Granulocytes 0 %   Abs Immature Granulocytes 0.04 0.00 - 0.07 K/uL    Comment: Performed at [facility], [address], [place], [place] [address]  Troponin I -  Once     Status: None   Collection Time: 06/07/18  9:31 PM  Result Value Ref Range   Troponin I <0.03 <0.03 ng/mL    Comment: Performed at [facility], [address], [place], [place] [address]  Brain natriuretic peptide     Status: None   Collection Time: 06/07/18  9:31 PM  Result Value Ref Range   B Natriuretic Peptide 43.0 0.0 - 100.0 pg/mL    Comment: Performed at [facility], [address], [place], [place] [address]  Troponin I - Once-Timed     Status: None   Collection Time: 06/08/18 12:25 AM  Result Value Ref Range   Troponin I <0.03 <0.03 ng/mL    Comment: Performed at [facility], [address] [place], [place] [address]  Magnesium     Status: None   Collection Time: 06/08/18 12:25 AM  Result Value Ref Range   Magnesium 1.9 1.7 - 2.4 mg/dL    Comment: Performed at [facility], [address], [place], [place] [address]  SARS Coronavirus 2 (CEPHEID - Performed in [place] hospital lab), Hosp Order     Status: None   Collection Time: 06/08/18  1:12 AM  Result Value Ref Range   SARS Coronavirus 2 NEGATIVE NEGATIVE    Comment: (NOTE) If result is NEGATIVE SARS-CoV-2 target nucleic acids are NOT DETECTED. The SARS-CoV-2 RNA is generally detectable in upper and lower  respiratory specimens during the acute phase of infection. The lowest  concentration of SARS-CoV-2 viral copies this assay can detect is 250  copies / mL. A negative result does not preclude SARS-CoV-2 infection  and should not be used as the sole basis for treatment or other  patient management decisions.  A negative result may occur with  improper specimen collection / handling, submission of specimen other  than nasopharyngeal swab, presence of viral mutation(s) within the  areas targeted by this assay, and inadequate number of viral copies  (<250 copies / mL). A negative result must be combined with clinical  observations, patient history, and epidemiological information. If result is POSITIVE SARS-CoV-2 target nucleic acids are DETECTED. The SARS-CoV-2 RNA is generally detectable in upper and lower  respiratory specimens dur ing the acute phase of infection.   Positive  results are indicative of active infection with SARS-CoV-2.  Clinical  correlation with patient history and other diagnostic information is  necessary to determine patient infection status.  Positive results do  not rule out bacterial infection or co-infection with other viruses. If result is PRESUMPTIVE POSTIVE SARS-CoV-2 nucleic acids MAY BE PRESENT.   A presumptive positive result was obtained on the submitted specimen  and confirmed on repeat testing.  While 2019 novel coronavirus  (SARS-CoV-2) nucleic acids may be present in the submitted sample  additional confirmatory testing may be necessary for epidemiological  and / or clinical management purposes  to differentiate between  SARS-CoV-2 and other Sarbecovirus currently known to infect humans.  If clinically indicated additional testing with an alternate test  methodology [phone]) is advised. The SARS-CoV-2 RNA is generally  detectable in upper and lower respiratory sp ecimens during the acute  phase of infection. The expected result is Negative. Fact Sheet for Patients:  StrictlyIdeas.no Fact Sheet for Healthcare Providers: BankingDealers.co.za This test is not yet approved or cleared by the [place] FDA and has been authorized for detection and/or diagnosis of SARS-CoV-2 by FDA under an Emergency Use Authorization (EUA).  This EUA will remain in effect (meaning  this test can be used) for the duration of the COVID-19 declaration under Section 564(b)(1) of the Act, 21 U.S.C. section 360bbb-3(b)(1), unless the authorization is terminated or revoked sooner. Performed at [facility], [address], [place], [place] [address]     Chemistries  Recent Labs  Lab 06/07/18 2131 06/08/18 0025  NA 138  --   K 3.3*  --   CL 103  --   CO2 23  --   GLUCOSE 125*  --   BUN 14  --   CREATININE 1.03  --   CALCIUM 9.1  --   MG  --  1.9    ------------------------------------------------------------------------------------------------------------------  ------------------------------------------------------------------------------------------------------------------ GFR: Estimated Creatinine Clearance: 100 mL/min (by C-G formula based on SCr of 1.03 mg/dL). Liver Function Tests: No results for input(s): AST, ALT, ALKPHOS, BILITOT, PROT, ALBUMIN in the last 168 hours. No results for input(s): LIPASE, AMYLASE in the last 168 hours. No results for input(s): AMMONIA in the last 168 hours. Coagulation Profile: No results for input(s): INR, PROTIME in the last 168 hours. Cardiac Enzymes: Recent Labs  Lab 06/07/18 2131 06/08/18 0025  TROPONINI <0.03 <0.03   BNP (last 3 results) No results for input(s): PROBNP in the last 8760 hours. HbA1C: No results for input(s): HGBA1C in the last 72 hours. CBG: No results for input(s): GLUCAP in the last 168 hours. Lipid Profile: No results for input(s): CHOL, HDL, LDLCALC, TRIG, CHOLHDL, LDLDIRECT in the last 72 hours. Thyroid Function Tests: No results for input(s): TSH, T4TOTAL, FREET4, T3FREE, THYROIDAB in the last 72 hours. Anemia Panel: No results for input(s): VITAMINB12, FOLATE, FERRITIN, TIBC, IRON, RETICCTPCT in the last 72 hours.  --------------------------------------------------------------------------------------------------------------- Urine analysis: No results found for: COLORURINE, APPEARANCEUR, LABSPEC, PHURINE, GLUCOSEU, HGBUR, BILIRUBINUR, KETONESUR, PROTEINUR, UROBILINOGEN, NITRITE, LEUKOCYTESUR    Imaging Results:    Ct Head Wo Contrast  Result Date: 06/08/2018 CLINICAL DATA:  Syncope, dizziness EXAM: CT HEAD WITHOUT CONTRAST TECHNIQUE: Contiguous axial images were obtained from the base of the skull through the vertex without intravenous contrast. COMPARISON:  None. FINDINGS: Brain: Mild cerebral and cerebellar atrophy. No acute intracranial abnormality.  Specifically, no hemorrhage, hydrocephalus, mass lesion, acute infarction, or significant intracranial injury. Vascular: No hyperdense vessel or unexpected calcification. Skull: No acute calvarial abnormality. Sinuses/Orbits: Visualized paranasal sinuses and mastoids clear. Orbital soft tissues unremarkable. Other: None IMPRESSION: Global atrophy.  No acute intracranial abnormality. Electronically Signed   By: Rolm Baptise M.D.   On: 06/08/2018 00:22   Dg Chest Port 1 View  Result Date: 06/07/2018 CLINICAL DATA:  Presyncope EXAM: PORTABLE CHEST 1 VIEW COMPARISON:  04/02/2013 FINDINGS: Enlarged cardiomediastinal silhouette, likely augmented by portable technique. No focal opacity or pleural effusion. No pneumothorax. IMPRESSION: No active disease.  Cardiomegaly Electronically Signed   By: Donavan Foil M.D.   On: 06/07/2018 21:40   Ekg: afib at 95, nl axis, no st-t change c/w ischemia   Assessment & Plan:    Active Problems:   Lymphedema of leg   Near syncope   Hypokalemia   Dizziness   TIA (transient ischemic attack)   Unspecified atrial fibrillation (HCC)  Dizziness/ near syncope, TIA Tele MRI / MRA brain Check carotid ultrasound Check cardiac echo Check hga1c, lipid Check PT, PTT, b12, folate, tsh, ANA Nursing bedside swallow, if passes, cardiac diet PT/OT/ speech to evaluate Aspirin 324mg  po x1 received in ED Start Lovenox 1mg / kg Pe Ell x1 Neurology consult ordered in computer  New onset Afib (Chads2vasc=3) Tele Trop I q6h x3 Check TSH  Check cardiac echo Start carvedilol 3.215mg  po bid Start Lovenox 1mg  / kg [place] x1 Check cbc in am Cardiology consultation  Hypokalemia Replete Check magnesium in am Check cmp in am  Chronic lymphedema Cont compression stockings  ETOH abuse CIWA Librium 50mg  po qday x1 day then 25mg  po qday x 1 day Warned that this can increase risk of irregular heart beat Counselled of increased risk of bleeding if drinks and is discharged on  anticoagulation Check cmp in am   DVT Prophylaxis-   Lovenox   AM Labs Ordered, also please review Full Orders  Family Communication: Admission, patients condition and plan of care including tests being ordered have been discussed with the patient who indicate understanding and agree with the plan and Code Status.  Code Status:  FULL CODE  Admission status: Observation: Based on patients clinical presentation and evaluation of above clinical data, I have made determination that patient meets observation  criteria at this time. Pt has possible TIA (vision loss right eye), as well as new onset Afib, which will require hospitalization for evaluation.   Time spent in minutes : 70   Jani Gravel M.D on 06/08/2018 at 2:31 AM

## 2018-06-07 NOTE — Discharge Instructions (Signed)
You were seen in the emergency department for feeling dizzy lightheaded.  You had blood work EKG and a chest x-ray.  Your potassium was slightly low.  It will be important for you to follow-up with your regular doctor for further evaluation of these symptoms.  Please return if any worsening symptoms.

## 2018-06-07 NOTE — ED Provider Notes (Signed)
[facility] EMERGENCY DEPARTMENT Provider Note   CSN: [id number] Arrival date & time: 06/07/18  2045    History   Chief Complaint Chief Complaint  Patient presents with   Dizziness    HPI LADERRICK Hurst is a 68 y.o. male.  He is here by EMS after having 3 episodes of feeling dizzy lightheaded and like he was going to faint since this afternoon.  He said he went to physical therapy and then went to the store and did a good amount of walking.  He has chronic back pain.  He was sitting at his desk in a hot room and he felt acutely flushed and lightheaded like he might pass out.  It improved with laying down in bed but recurred again while he was laying there later associated with some diaphoresis.  EMS no new medications or recent illness.  Denies any chest pain.  When he was feeling lightheaded he said the vision decreased in his right eye but that lasted only briefly.     The history is provided by the patient.  Dizziness  Quality:  Lightheadedness Severity:  Moderate Onset quality:  Sudden Timing:  Intermittent Progression:  Resolved Chronicity:  New Context: not with loss of consciousness and not with physical activity   Relieved by:  Lying down Worsened by:  Nothing Ineffective treatments:  None tried Associated symptoms: vision changes   Associated symptoms: no chest pain, no headaches, no nausea, no shortness of breath and no vomiting   Risk factors: no heart disease and no hx of stroke     Past Medical History:  Diagnosis Date   Cellulitis of right lower leg 04/04/2013   Lymphedema of leg 04/08/2013    Patient Active Problem List   Diagnosis Date Noted   Lymphedema of leg 04/08/2013   Cellulitis of right lower leg 04/04/2013   Obesity, unspecified 04/04/2013   Alcohol abuse 04/04/2013   Cellulitis and abscess of leg 04/04/2013    Past Surgical History:  Procedure Laterality Date   INNER EAR SURGERY     ORTHOPEDIC SURGERY          Home  Medications    Prior to Admission medications   Medication Sig Start Date End Date Taking? Authorizing Provider  doxycycline (VIBRA-TABS) 100 MG tablet Take 1 tablet (100 mg total) by mouth every 12 (twelve) hours. Antibiotic. 04/08/13   Rexene Alberts, MD    Family History History reviewed. No pertinent family history.  Social History Social History   Tobacco Use   Smoking status: Former Smoker  Substance Use Topics   Alcohol use: Yes    Comment: 4-5 beer daily   Drug use: No     Allergies   Patient has no known allergies.   Review of Systems Review of Systems  Constitutional: Positive for diaphoresis. Negative for fever.  HENT: Negative for sore throat.   Eyes: Positive for visual disturbance.  Respiratory: Negative for shortness of breath.   Cardiovascular: Negative for chest pain.  Gastrointestinal: Negative for abdominal pain, nausea and vomiting.  Genitourinary: Negative for dysuria.  Musculoskeletal: Negative for neck pain.  Skin: Negative for rash.  Neurological: Positive for dizziness and light-headedness. Negative for seizures, syncope and headaches.     Physical Exam Updated Vital Signs BP (!) 149/82 (BP Location: Right Arm)    Pulse 94    Temp (!) 97.5 F (36.4 C) (Oral)    Resp 18    Ht 6\' 1"  (1.854 m)    Wt  130.6 kg    SpO2 98%    BMI 38.00 kg/m   Physical Exam Vitals signs and nursing note reviewed.  Constitutional:      Appearance: He is well-developed.  HENT:     Head: Normocephalic and atraumatic.  Eyes:     Conjunctiva/sclera: Conjunctivae normal.  Neck:     Musculoskeletal: Neck supple.  Cardiovascular:     Rate and Rhythm: Regular rhythm. Tachycardia present.     Heart sounds: No murmur.  Pulmonary:     Effort: Pulmonary effort is normal. No respiratory distress.     Breath sounds: Normal breath sounds.  Abdominal:     Palpations: Abdomen is soft.     Tenderness: There is no abdominal tenderness.  Musculoskeletal: Normal range of  motion.     Right lower leg: Edema present.     Left lower leg: Edema present.  Skin:    General: Skin is warm and dry.     Capillary Refill: Capillary refill takes less than 2 seconds.  Neurological:     General: No focal deficit present.     Mental Status: He is alert and oriented to person, place, and time.     Sensory: No sensory deficit.     Motor: No weakness.     Gait: Gait normal.      ED Treatments / Results  Labs (all labs ordered are listed, but only abnormal results are displayed) Labs Reviewed  BASIC METABOLIC PANEL - Abnormal; Notable for the following components:      Result Value   Potassium 3.3 (*)    Glucose, Bld 125 (*)    All other components within normal limits  CBC WITH DIFFERENTIAL/PLATELET - Abnormal; Notable for the following components:   WBC 11.2 (*)    Neutro Abs 9.6 (*)    All other components within normal limits  COMPREHENSIVE METABOLIC PANEL - Abnormal; Notable for the following components:   Glucose, Bld 111 (*)    Calcium 8.7 (*)    Total Protein 5.7 (*)    Albumin 3.2 (*)    AST 13 (*)    All other components within normal limits  CBC - Abnormal; Notable for the following components:   RBC 4.11 (*)    HCT 38.7 (*)    All other components within normal limits  LIPID PANEL - Abnormal; Notable for the following components:   HDL 31 (*)    All other components within normal limits  PROTIME-INR - Abnormal; Notable for the following components:   Prothrombin Time 15.8 (*)    INR 1.3 (*)    All other components within normal limits  SEDIMENTATION RATE - Abnormal; Notable for the following components:   Sed Rate 28 (*)    All other components within normal limits  SARS CORONAVIRUS 2 (HOSPITAL ORDER, [place] [facility])  TROPONIN I  TROPONIN I  MAGNESIUM  BRAIN NATRIURETIC PEPTIDE  TROPONIN I  APTT  VITAMIN B12  TSH  HIV ANTIBODY (ROUTINE TESTING W REFLEX)  TROPONIN I  TROPONIN I  HEMOGLOBIN A1C  FOLATE RBC   ANA    EKG EKG Interpretation  Date/Time:  Thursday Jun 07 2018 23:13:16 EDT Ventricular Rate:  96 PR Interval:    QRS Duration: 101 QT Interval:  367 QTC Calculation: 464 R Axis:   -57 Text Interpretation:  Ectopic atrial rhythm Supraventricular bigeminy Left anterior fascicular block Abnormal R-wave progression, late transition Probable left ventricular hypertrophy No significant change was found Confirmed  by Ezequiel Essex [id number]) on 06/07/2018 11:16:01 PM   Radiology Ct Head Wo Contrast  Result Date: 06/08/2018 CLINICAL DATA:  Syncope, dizziness EXAM: CT HEAD WITHOUT CONTRAST TECHNIQUE: Contiguous axial images were obtained from the base of the skull through the vertex without intravenous contrast. COMPARISON:  None. FINDINGS: Brain: Mild cerebral and cerebellar atrophy. No acute intracranial abnormality. Specifically, no hemorrhage, hydrocephalus, mass lesion, acute infarction, or significant intracranial injury. Vascular: No hyperdense vessel or unexpected calcification. Skull: No acute calvarial abnormality. Sinuses/Orbits: Visualized paranasal sinuses and mastoids clear. Orbital soft tissues unremarkable. Other: None IMPRESSION: Global atrophy.  No acute intracranial abnormality. Electronically Signed   By: Rolm Baptise M.D.   On: 06/08/2018 00:22   Mr Jodene Nam Head Wo Contrast  Result Date: 06/08/2018 CLINICAL DATA:  Dizziness beginning yesterday. Vision loss on the right. EXAM: MRI HEAD WITHOUT CONTRAST MRA HEAD WITHOUT CONTRAST TECHNIQUE: Multiplanar, multiecho pulse sequences of the brain and surrounding structures were obtained without intravenous contrast. Angiographic images of the head were obtained using MRA technique without contrast. COMPARISON:  CT same day FINDINGS: MRI HEAD FINDINGS Brain: Diffusion imaging does not show any acute or subacute infarction. The brainstem is normal. There is a focus of hemosiderin deposition in the inferior right cerebellum that could be  associated with an old small vessel infarction or a tiny cavernoma. Cerebral hemispheres show mild volume loss but without old or recent small or large vessel infarction. No mass lesion, hemorrhage, hydrocephalus or subdural hematoma or hygroma. There is probably an arachnoid cyst along the left lateral margin of the cerebellum, not likely clinically significant. There may be a small arachnoid cyst at the anterior middle cranial fossa on the right as well. Vascular: Major vessels at the base of the brain show flow. Skull and upper cervical spine: Negative Sinuses/Orbits: Clear/normal Other: None MRA HEAD FINDINGS Both internal carotid arteries are patent through the skull base. No siphon stenosis. The anterior and middle cerebral vessels are patent without proximal stenosis, aneurysm or vascular malformation. Both vertebral arteries are patent to the basilar. In the V4 segment on the left, there is a question of a filling defect. This could be artifactual or could indicate a localized vertebral dissection. I would suggest CT angiography to evaluate this segment. IMPRESSION: Normal appearance of the brain for a person of this age. No acute infarction. Punctate focus of hemosiderin in the inferior cerebellum on the right that could be a tiny cavernoma or relate to an old small vessel insult no longer specifically visible. Brain does not show a pattern of widespread small-vessel disease. No large or medium vessel occlusion. Question abnormal V4 segment of the left vertebral artery. Localized dissection not excluded. I would suggest CT angiography to evaluate that. Electronically Signed   By: Nelson Chimes M.D.   On: 06/08/2018 10:40   Mr Brain Wo Contrast  Result Date: 06/08/2018 CLINICAL DATA:  Dizziness beginning yesterday. Vision loss on the right. EXAM: MRI HEAD WITHOUT CONTRAST MRA HEAD WITHOUT CONTRAST TECHNIQUE: Multiplanar, multiecho pulse sequences of the brain and surrounding structures were obtained  without intravenous contrast. Angiographic images of the head were obtained using MRA technique without contrast. COMPARISON:  CT same day FINDINGS: MRI HEAD FINDINGS Brain: Diffusion imaging does not show any acute or subacute infarction. The brainstem is normal. There is a focus of hemosiderin deposition in the inferior right cerebellum that could be associated with an old small vessel infarction or a tiny cavernoma. Cerebral hemispheres show mild volume loss but without old  or recent small or large vessel infarction. No mass lesion, hemorrhage, hydrocephalus or subdural hematoma or hygroma. There is probably an arachnoid cyst along the left lateral margin of the cerebellum, not likely clinically significant. There may be a small arachnoid cyst at the anterior middle cranial fossa on the right as well. Vascular: Major vessels at the base of the brain show flow. Skull and upper cervical spine: Negative Sinuses/Orbits: Clear/normal Other: None MRA HEAD FINDINGS Both internal carotid arteries are patent through the skull base. No siphon stenosis. The anterior and middle cerebral vessels are patent without proximal stenosis, aneurysm or vascular malformation. Both vertebral arteries are patent to the basilar. In the V4 segment on the left, there is a question of a filling defect. This could be artifactual or could indicate a localized vertebral dissection. I would suggest CT angiography to evaluate this segment. IMPRESSION: Normal appearance of the brain for a person of this age. No acute infarction. Punctate focus of hemosiderin in the inferior cerebellum on the right that could be a tiny cavernoma or relate to an old small vessel insult no longer specifically visible. Brain does not show a pattern of widespread small-vessel disease. No large or medium vessel occlusion. Question abnormal V4 segment of the left vertebral artery. Localized dissection not excluded. I would suggest CT angiography to evaluate that.  Electronically Signed   By: Nelson Chimes M.D.   On: 06/08/2018 10:40   US Carotid Bilateral  Result Date: 06/08/2018 CLINICAL DATA:  68 year old male with TIA EXAM: BILATERAL CAROTID DUPLEX ULTRASOUND TECHNIQUE: Pearline Cables scale imaging, color Doppler and duplex ultrasound were performed of bilateral carotid and vertebral arteries in the neck. COMPARISON:  None. FINDINGS: Criteria: Quantification of carotid stenosis is based on velocity parameters that correlate the residual internal carotid diameter with NASCET-based stenosis levels, using the diameter of the distal internal carotid lumen as the denominator for stenosis measurement. The following velocity measurements were obtained: RIGHT ICA:  Systolic 92 cm/sec, Diastolic 21 cm/sec CCA:  672 cm/sec SYSTOLIC ICA/CCA RATIO:  0.7 ECA:  124 cm/sec LEFT ICA:  Systolic 094 cm/sec, Diastolic 44 cm/sec CCA:  709 cm/sec SYSTOLIC ICA/CCA RATIO:  0.8 ECA:  128 cm/sec Right Brachial SBP: Not acquired Left Brachial SBP: Not acquired RIGHT CAROTID ARTERY: No significant calcified disease of the right common carotid artery. Intermediate waveform maintained. Heterogeneous plaque without significant calcifications at the right carotid bifurcation. Low resistance waveform of the right ICA. Tortuosity. RIGHT VERTEBRAL ARTERY: Antegrade flow with low resistance waveform. LEFT CAROTID ARTERY: No significant calcified disease of the left common carotid artery. Intermediate waveform maintained. Heterogeneous plaque at the left carotid bifurcation without significant calcifications. Low resistance waveform of the left ICA. Tortuosity LEFT VERTEBRAL ARTERY:  Antegrade flow with low resistance waveform. IMPRESSION: Color duplex indicates minimal heterogeneous plaque, with no hemodynamically significant stenosis by duplex criteria in the extracranial cerebrovascular circulation. Signed, Dulcy Fanny. Dellia Nims, RPVI [facility]  Electronically Signed   By: Corrie Mckusick D.O.   On: 06/08/2018 10:51   Dg Chest Port 1 View  Result Date: 06/07/2018 CLINICAL DATA:  Presyncope EXAM: PORTABLE CHEST 1 VIEW COMPARISON:  04/02/2013 FINDINGS: Enlarged cardiomediastinal silhouette, likely augmented by portable technique. No focal opacity or pleural effusion. No pneumothorax. IMPRESSION: No active disease.  Cardiomegaly Electronically Signed   By: Donavan Foil M.D.   On: 06/07/2018 21:40    Procedures Procedures (including critical care time)  Medications Ordered in ED Medications  sodium chloride 0.9 %  bolus 500 mL (has no administration in time range)     Initial Impression / Assessment and Plan / ED Course  I have reviewed the triage vital signs and the nursing notes.  Pertinent labs & imaging results that were available during my care of the patient were reviewed by me and considered in my medical decision making (see chart for details).  Clinical Course as of Jun 07 1305  Thu Jun 07, 2018  2127 Differential diagnosis includes presyncope, dehydration, arrhythmia, ischemia, pneumonia.  Afebrile here heart rate improved blood pressure good here.  Fairly benign exam other than some chronic lymphedema in his lower extremities.  Checking some screening labs EKG chest x-ray.   [MB]  2130 EKG showing a sinus tachycardia with some PACs.  Left anterior fascicular block.  LVH.  No prior EKG to compare with.   [MB]  2259 Patient's initial work-up has shown a slightly low potassium at 3.3.  First troponin negative.  I have ordered him orthostatics and a delta troponin.  I will sign out to my partner to follow-up on the troponin and if negative and the patient's feeling improved he likely can be discharged.   [MB]    Clinical Course User Index [MB] Hayden Rasmussen, MD        Final Clinical Impressions(s) / ED Diagnoses   Final diagnoses:  Lightheadedness  Hypokalemia  Atrial fibrillation, unspecified type [facility])  Near  syncope    ED Discharge Orders    None       Hayden Rasmussen, MD 06/08/18 1309

## 2018-06-07 NOTE — ED Triage Notes (Signed)
Patient having physical therapy today and became dizzy. Ems reports patient having shortness of breath and showing new a-fib on the monitor with hypotension. Ems states patient was diaphoretic upon their arrival. Patient alert and oriented x 4.

## 2018-06-08 ENCOUNTER — Observation Stay (HOSPITAL_BASED_OUTPATIENT_CLINIC_OR_DEPARTMENT_OTHER): Payer: Medicare HMO

## 2018-06-08 ENCOUNTER — Observation Stay (HOSPITAL_COMMUNITY): Payer: Medicare HMO

## 2018-06-08 ENCOUNTER — Encounter (HOSPITAL_COMMUNITY): Payer: Self-pay | Admitting: Internal Medicine

## 2018-06-08 DIAGNOSIS — I4891 Unspecified atrial fibrillation: Secondary | ICD-10-CM | POA: Diagnosis present

## 2018-06-08 DIAGNOSIS — R42 Dizziness and giddiness: Secondary | ICD-10-CM | POA: Diagnosis not present

## 2018-06-08 DIAGNOSIS — I480 Paroxysmal atrial fibrillation: Secondary | ICD-10-CM | POA: Diagnosis not present

## 2018-06-08 DIAGNOSIS — I361 Nonrheumatic tricuspid (valve) insufficiency: Secondary | ICD-10-CM | POA: Diagnosis not present

## 2018-06-08 DIAGNOSIS — G459 Transient cerebral ischemic attack, unspecified: Secondary | ICD-10-CM | POA: Diagnosis not present

## 2018-06-08 DIAGNOSIS — R55 Syncope and collapse: Secondary | ICD-10-CM | POA: Diagnosis not present

## 2018-06-08 DIAGNOSIS — H547 Unspecified visual loss: Secondary | ICD-10-CM | POA: Diagnosis not present

## 2018-06-08 LAB — HEMOGLOBIN A1C
Hgb A1c MFr Bld: 5.2 % (ref 4.8–5.6)
Mean Plasma Glucose: 102.54 mg/dL

## 2018-06-08 LAB — CBC
HCT: 38.7 % — ABNORMAL LOW (ref 39.0–52.0)
Hemoglobin: 13.3 g/dL (ref 13.0–17.0)
MCH: 32.4 pg (ref 26.0–34.0)
MCHC: 34.4 g/dL (ref 30.0–36.0)
MCV: 94.2 fL (ref 80.0–100.0)
Platelets: 175 10*3/uL (ref 150–400)
RBC: 4.11 MIL/uL — ABNORMAL LOW (ref 4.22–5.81)
RDW: 12.3 % (ref 11.5–15.5)
WBC: 6.8 10*3/uL (ref 4.0–10.5)
nRBC: 0 % (ref 0.0–0.2)

## 2018-06-08 LAB — ECHOCARDIOGRAM COMPLETE
Height: 73 in
Weight: 4684.33 oz

## 2018-06-08 LAB — LIPID PANEL
Cholesterol: 129 mg/dL (ref 0–200)
HDL: 31 mg/dL — ABNORMAL LOW (ref >40)
LDL Cholesterol: 81 mg/dL (ref 0–99)
Total CHOL/HDL Ratio: 4.2 RATIO
Triglycerides: 84 mg/dL (ref <150)
VLDL: 17 mg/dL (ref 0–40)

## 2018-06-08 LAB — COMPREHENSIVE METABOLIC PANEL
ALT: 15 U/L (ref 0–44)
AST: 13 U/L — ABNORMAL LOW (ref 15–41)
Albumin: 3.2 g/dL — ABNORMAL LOW (ref 3.5–5.0)
Alkaline Phosphatase: 51 U/L (ref 38–126)
Anion gap: 10 (ref 5–15)
BUN: 13 mg/dL (ref 8–23)
CO2: 24 mmol/L (ref 22–32)
Calcium: 8.7 mg/dL — ABNORMAL LOW (ref 8.9–10.3)
Chloride: 106 mmol/L (ref 98–111)
Creatinine, Ser: 0.81 mg/dL (ref 0.61–1.24)
GFR calc Af Amer: >60 mL/min (ref >60)
GFR calc non Af Amer: >60 mL/min (ref >60)
Glucose, Bld: 111 mg/dL — ABNORMAL HIGH (ref 70–99)
Potassium: 3.6 mmol/L (ref 3.5–5.1)
Sodium: 140 mmol/L (ref 135–145)
Total Bilirubin: 0.9 mg/dL (ref 0.3–1.2)
Total Protein: 5.7 g/dL — ABNORMAL LOW (ref 6.5–8.1)

## 2018-06-08 LAB — MAGNESIUM: Magnesium: 1.9 mg/dL (ref 1.7–2.4)

## 2018-06-08 LAB — TROPONIN I
Troponin I: <0.03 ng/mL (ref <0.03)
Troponin I: <0.03 ng/mL (ref <0.03)
Troponin I: <0.03 ng/mL (ref <0.03)

## 2018-06-08 LAB — PROTIME-INR
INR: 1.3 — ABNORMAL HIGH (ref 0.8–1.2)
Prothrombin Time: 15.8 seconds — ABNORMAL HIGH (ref 11.4–15.2)

## 2018-06-08 LAB — BRAIN NATRIURETIC PEPTIDE: B Natriuretic Peptide: 43 pg/mL (ref 0.0–100.0)

## 2018-06-08 LAB — SEDIMENTATION RATE: Sed Rate: 28 mm/hr — ABNORMAL HIGH (ref 0–16)

## 2018-06-08 LAB — APTT: aPTT: 29 seconds (ref 24–36)

## 2018-06-08 LAB — VITAMIN B12: Vitamin B-12: 269 pg/mL (ref 180–914)

## 2018-06-08 LAB — TSH: TSH: 1.189 u[IU]/mL (ref 0.350–4.500)

## 2018-06-08 LAB — SARS CORONAVIRUS 2 BY RT PCR (HOSPITAL ORDER, PERFORMED IN ~~LOC~~ HOSPITAL LAB): SARS Coronavirus 2: NEGATIVE

## 2018-06-08 MED ORDER — FOLIC ACID 1 MG PO TABS: 1.0000 mg | ORAL_TABLET | Freq: Every day | ORAL | 0 refills | Status: AC

## 2018-06-08 MED ORDER — CHLORDIAZEPOXIDE HCL 25 MG PO CAPS
50.0000 mg | ORAL_CAPSULE | Freq: Every day | ORAL | Status: AC
  Administered 2018-06-08: 50 mg via ORAL
  Filled 2018-06-08: qty 2

## 2018-06-08 MED ORDER — FOLIC ACID 1 MG PO TABS
1.0000 mg | ORAL_TABLET | Freq: Every day | ORAL | Status: DC
  Administered 2018-06-08: 1 mg via ORAL
  Filled 2018-06-08: qty 1

## 2018-06-08 MED ORDER — FENTANYL CITRATE (PF) 100 MCG/2ML IJ SOLN
50.0000 ug | Freq: Once | INTRAMUSCULAR | Status: AC
  Administered 2018-06-08: 50 ug via INTRAVENOUS
  Filled 2018-06-08: qty 2

## 2018-06-08 MED ORDER — THIAMINE HCL 100 MG PO TABS: 100.0000 mg | ORAL_TABLET | Freq: Every day | ORAL | 0 refills | Status: AC

## 2018-06-08 MED ORDER — LORAZEPAM 2 MG/ML IJ SOLN: 1.0000 mg | Freq: Four times a day (QID) | INTRAMUSCULAR | Status: DC | PRN

## 2018-06-08 MED ORDER — THIAMINE HCL 100 MG/ML IJ SOLN
100.0000 mg | Freq: Every day | INTRAMUSCULAR | Status: DC
  Filled 2018-06-08: qty 2

## 2018-06-08 MED ORDER — LORAZEPAM 2 MG/ML IJ SOLN: 0.0000 mg | Freq: Four times a day (QID) | INTRAMUSCULAR | Status: DC

## 2018-06-08 MED ORDER — STROKE: EARLY STAGES OF RECOVERY BOOK
Freq: Once | Status: AC
  Administered 2018-06-08: 05:00:00

## 2018-06-08 MED ORDER — CHLORDIAZEPOXIDE HCL 25 MG PO CAPS: 25.0000 mg | ORAL_CAPSULE | Freq: Every day | ORAL | Status: DC

## 2018-06-08 MED ORDER — ASPIRIN 81 MG PO CHEW: 324.0000 mg | CHEWABLE_TABLET | Freq: Once | ORAL | Status: DC

## 2018-06-08 MED ORDER — LORAZEPAM 2 MG/ML IJ SOLN: 0.0000 mg | Freq: Two times a day (BID) | INTRAMUSCULAR | Status: DC

## 2018-06-08 MED ORDER — LORAZEPAM 1 MG PO TABS: 1.0000 mg | ORAL_TABLET | Freq: Four times a day (QID) | ORAL | Status: DC | PRN

## 2018-06-08 MED ORDER — ATORVASTATIN CALCIUM 40 MG PO TABS: 80.0000 mg | ORAL_TABLET | Freq: Every day | ORAL | Status: DC

## 2018-06-08 MED ORDER — VITAMIN B-1 100 MG PO TABS
100.0000 mg | ORAL_TABLET | Freq: Every day | ORAL | Status: DC
  Administered 2018-06-08: 100 mg via ORAL
  Filled 2018-06-08: qty 1

## 2018-06-08 MED ORDER — ADULT MULTIVITAMIN W/MINERALS CH
1.0000 | ORAL_TABLET | Freq: Every day | ORAL | Status: DC
  Administered 2018-06-08: 1 via ORAL
  Filled 2018-06-08: qty 1

## 2018-06-08 MED ORDER — ENOXAPARIN SODIUM 150 MG/ML ~~LOC~~ SOLN
1.0000 mg/kg | Freq: Once | SUBCUTANEOUS | Status: AC
  Administered 2018-06-08: 130 mg via SUBCUTANEOUS
  Filled 2018-06-08: qty 1

## 2018-06-08 NOTE — Evaluation (Signed)
Physical Therapy Evaluation Patient Details Name: Raymond Hurst MRN: [id number] DOB: March 20, 1950 Today's Date: 06/08/2018   History of Present Illness  Raymond Hurst  is a 68 y.o. male, w etoh abuse, (as much as 1 bottle of wine per day), chronic back pain, apparently presents with c/o dizziness and palpitations starting about 4:30pm lasting for a few minutes, associated with vision loss right eye x 49minute.  Pt denies headache, slurred speech, cp, sob, n/v, diarrhea, brbpr, dysuria, focal neurological weakness, numbness, tingling. Pt states that symptoms of dizziness and palpitations occurred about 30-60 minutes later this concerned him and so he presented to the ED. Pt denies any recent coronavirus contact, no recent travel.  Pt has had prior episodes of dizziness but is vague about when they have occurred. En route apparently in Afib     Clinical Impression  Patient functioning at baseline for functional mobility and gait other than c/o minor pain and discomfort in low back requiring increased time to complete transfers and ambulation.   Plan:  Patient discharged from physical therapy to care of nursing for ambulation daily as tolerated for length of stay.     Follow Up Recommendations No PT follow up    Equipment Recommendations  None recommended by PT    Recommendations for Other Services       Precautions / Restrictions Precautions Precautions: None Restrictions Weight Bearing Restrictions: No      Mobility  Bed Mobility Overal bed mobility: Modified Independent             General bed mobility comments: increased time  Transfers Overall transfer level: Modified independent               General transfer comment: increased time  Ambulation/Gait Ambulation/Gait assistance: Modified independent (Device/Increase time) Gait Distance (Feet): 200 Feet Assistive device: None Gait Pattern/deviations: WFL(Within Functional Limits) Gait velocity: slightly decreased    General Gait Details: demonstrates good return for ambulation on level, inclined and declined surfaces without loss of balance  Stairs Stairs: Yes Stairs assistance: Modified independent (Device/Increase time) Stair Management: One rail Left Number of Stairs: 9 General stair comments: demonstrates good return for going up/down 9 steps using 1 siderail in stairwell without loss of balance  Wheelchair Mobility    Modified Rankin (Stroke Patients Only)       Balance Overall balance assessment: No apparent balance deficits (not formally assessed)                                           Pertinent Vitals/Pain Pain Assessment: 0-10 Pain Score: 3  Pain Location: chronic low back Pain Descriptors / Indicators: Sore;Aching;Discomfort Pain Intervention(s): Limited activity within patient's tolerance;Monitored during session    Home Living Family/patient expects to be discharged to:: Private residence Living Arrangements: Spouse/significant other Available Help at Discharge: Family;Available PRN/intermittently Type of Home: House Home Access: Stairs to enter Entrance Stairs-Rails: None Entrance Stairs-Number of Steps: 2 Home Layout: Two level;Bed/bath upstairs Home Equipment: None      Prior Function Level of Independence: Independent         Comments: Hydrographic surveyor, drives     Journalist, newspaper        Extremity/Trunk Assessment   Upper Extremity Assessment Upper Extremity Assessment: Overall WFL for tasks assessed    Lower Extremity Assessment Lower Extremity Assessment: Overall WFL for tasks assessed    Cervical / Trunk  Assessment Cervical / Trunk Assessment: Normal  Communication   Communication: No difficulties  Cognition Arousal/Alertness: Awake/alert Behavior During Therapy: WFL for tasks assessed/performed Overall Cognitive Status: Within Functional Limits for tasks assessed                                         General Comments      Exercises     Assessment/Plan    PT Assessment Patent does not need any further PT services  PT Problem List         PT Treatment Interventions      PT Goals (Current goals can be found in the Care Plan section)  Acute Rehab PT Goals Patient Stated Goal: return home and resume OPPT for low back pain PT Goal Formulation: With patient Time For Goal Achievement: 06/08/18 Potential to Achieve Goals: Good    Frequency     Barriers to discharge        Co-evaluation               AM-PAC PT "6 Clicks" Mobility  Outcome Measure Help needed turning from your back to your side while in a flat bed without using bedrails?: None Help needed moving from lying on your back to sitting on the side of a flat bed without using bedrails?: None Help needed moving to and from a bed to a chair (including a wheelchair)?: None Help needed standing up from a chair using your arms (e.g., wheelchair or bedside chair)?: None Help needed to walk in hospital room?: None Help needed climbing 3-5 steps with a railing? : None 6 Click Score: 24    End of Session   Activity Tolerance: Patient tolerated treatment well Patient left: in chair;with call bell/phone within reach Nurse Communication: Mobility status PT Visit Diagnosis: Other abnormalities of gait and mobility (R26.89);Muscle weakness (generalized) (M62.81)    Time: 8016-5537 PT Time Calculation (min) (ACUTE ONLY): 20 min   Charges:   PT Evaluation $PT Eval Moderate Complexity: 1 Mod PT Treatments $Gait Training: 8-22 mins        8:56 AM, 06/08/18 Lonell Grandchild, MPT Physical Therapist with [facility] [phone] office [phone] mobile phone

## 2018-06-08 NOTE — Progress Notes (Signed)
*  PRELIMINARY RESULTS* Echocardiogram 2D Echocardiogram has been performed.  Raymond Hurst 06/08/2018, 12:49 PM

## 2018-06-08 NOTE — Plan of Care (Signed)
  Problem: Education: Goal: Knowledge of General Education information will improve Description Including pain rating scale, medication(s)/side effects and non-pharmacologic comfort measures Outcome: Adequate for Discharge   Problem: Health Behavior/Discharge Planning: Goal: Ability to manage health-related needs will improve Outcome: Adequate for Discharge   Problem: Clinical Measurements: Goal: Ability to maintain clinical measurements within normal limits will improve Outcome: Adequate for Discharge   Problem: Activity: Goal: Risk for activity intolerance will decrease Outcome: Adequate for Discharge   Problem: Nutrition: Goal: Adequate nutrition will be maintained Outcome: Adequate for Discharge   Problem: Coping: Goal: Level of anxiety will decrease Outcome: Adequate for Discharge   Problem: Elimination: Goal: Will not experience complications related to bowel motility Outcome: Adequate for Discharge   Problem: Pain Managment: Goal: General experience of comfort will improve Outcome: Adequate for Discharge   Problem: Safety: Goal: Ability to remain free from injury will improve Outcome: Adequate for Discharge   Problem: Skin Integrity: Goal: Risk for impaired skin integrity will decrease Outcome: Adequate for Discharge   

## 2018-06-08 NOTE — Progress Notes (Signed)
SLP Cancellation Note  Patient Details Name: TOREN TUCHOLSKI MRN: [id number] DOB: [birth year]   Cancelled treatment:       Reason Eval/Treat Not Completed: SLP screened, no needs identified, will sign off; SLP screened Pt in room. Pt denies any changes in swallowing, speech, language, or cognition. SLE will be deferred at this time. Reconsult if indicated. SLP will sign off. Pt reports baseline occasional difficulty with speech.   Thank you,  Genene Churn, [place] 06/08/2018, 9:31 AM

## 2018-06-08 NOTE — ED Notes (Signed)
Unable to ambulate patient at this time due to back pain

## 2018-06-08 NOTE — Discharge Summary (Signed)
Physician Discharge Summary  Raymond Hurst WGY:[id number] DOB: 05-Oct-1950 DOA: 06/07/2018  PCP: Patient, No Pcp Per  Admit date: 06/07/2018  Discharge date: 06/08/2018  Admitted From:Home  Disposition:  Home  Recommendations for Outpatient Follow-up:  1. Follow up with PCP in 1-2 weeks 2. Continue on prior home medications  Home Health: None  Equipment/Devices: None  Discharge Condition: Stable  CODE STATUS: Full  Diet recommendation: Heart Healthy  Brief/Interim Summary: Per HPI: Raymond Hurst  is a 68 y.o. male, w etoh abuse, (as much as 1 bottle of wine per day), chronic back pain, apparently presents with c/o dizziness and palpitations starting about 4:30pm lasting for a few minutes, associated with vision loss right eye x 89minute.  Pt denies headache, slurred speech, cp, sob, n/v, diarrhea, brbpr, dysuria, focal neurological weakness, numbness, tingling. Pt states that symptoms of dizziness and palpitations occurred about 30-60 minutes later this concerned him and so he presented to the ED. Pt denies any recent coronavirus contact, no recent travel.  Pt has had prior episodes of dizziness but is vague about when they have occurred. En route apparently in Afib.  Patient was admitted with dizziness and near syncopal symptoms with possible TIA and questionable new onset atrial fibrillation, but appear to be in sinus rhythm the entire time while admitted.  He has done quite well and has undergone MRI/MRI of the brain with no findings of stroke or TIA noted.  His symptoms have completely improved during the entirety of his stay.  2D echocardiogram is currently pending and will need review by his PCP.  Carotid ultrasounds with minimal plaque noted bilaterally.  He has been assessed by PT/OT with no recommendations for ongoing rehabilitation at this time.  He will need close follow-up to his PCP with repeat labs and EKG at that time and review of 2D echocardiogram.  No findings of atrial  fibrillation once again noted, but will require further review in the outpatient setting.  He is otherwise stable for discharge at this time with no other acute events during the course of this admission noted.  Discharge Diagnoses:  Principal Problem:   Unspecified atrial fibrillation ([place]) Active Problems:   Lymphedema of leg   Near syncope   Hypokalemia   Dizziness   TIA (transient ischemic attack)    Discharge Instructions  Discharge Instructions    Diet - low sodium heart healthy   Complete by:  As directed    Increase activity slowly   Complete by:  As directed      Allergies as of 06/08/2018   No Known Allergies     Medication List    STOP taking these medications   Fluzone High-Dose 0.5 ML injection Generic drug:  Influenza vac split quadrivalent PF     TAKE these medications   baclofen 10 MG tablet Commonly known as:  LIORESAL Take 1 tablet by mouth every 6 (six) hours as needed.   folic acid 1 MG tablet Commonly known as:  FOLVITE Take 1 tablet (1 mg total) by mouth daily for 30 days. Start taking on:  Jun 09, 2018   ibuprofen 200 MG tablet Commonly known as:  ADVIL Take 200 mg by mouth every 6 (six) hours as needed.   meloxicam 15 MG tablet Commonly known as:  MOBIC Take 15 mg by mouth daily. with food   Norco 10-325 MG tablet Generic drug:  HYDROcodone-acetaminophen Take 1 tablet by mouth 3 (three) times daily as needed.   thiamine 100 MG tablet  Take 1 tablet (100 mg total) by mouth daily for 30 days. Start taking on:  Jun 09, 2018      Follow-up Information    pcp Dr. Tyrone Sage Follow up in 1 week(s).          No Known Allergies  Consultations:  None   Procedures/Studies: Ct Head Wo Contrast  Result Date: 06/08/2018 CLINICAL DATA:  Syncope, dizziness EXAM: CT HEAD WITHOUT CONTRAST TECHNIQUE: Contiguous axial images were obtained from the base of the skull through the vertex without intravenous contrast. COMPARISON:  None.  FINDINGS: Brain: Mild cerebral and cerebellar atrophy. No acute intracranial abnormality. Specifically, no hemorrhage, hydrocephalus, mass lesion, acute infarction, or significant intracranial injury. Vascular: No hyperdense vessel or unexpected calcification. Skull: No acute calvarial abnormality. Sinuses/Orbits: Visualized paranasal sinuses and mastoids clear. Orbital soft tissues unremarkable. Other: None IMPRESSION: Global atrophy.  No acute intracranial abnormality. Electronically Signed   By: Rolm Baptise M.D.   On: 06/08/2018 00:22   Mr Jodene Nam Head Wo Contrast  Result Date: 06/08/2018 CLINICAL DATA:  Dizziness beginning yesterday. Vision loss on the right. EXAM: MRI HEAD WITHOUT CONTRAST MRA HEAD WITHOUT CONTRAST TECHNIQUE: Multiplanar, multiecho pulse sequences of the brain and surrounding structures were obtained without intravenous contrast. Angiographic images of the head were obtained using MRA technique without contrast. COMPARISON:  CT same day FINDINGS: MRI HEAD FINDINGS Brain: Diffusion imaging does not show any acute or subacute infarction. The brainstem is normal. There is a focus of hemosiderin deposition in the inferior right cerebellum that could be associated with an old small vessel infarction or a tiny cavernoma. Cerebral hemispheres show mild volume loss but without old or recent small or large vessel infarction. No mass lesion, hemorrhage, hydrocephalus or subdural hematoma or hygroma. There is probably an arachnoid cyst along the left lateral margin of the cerebellum, not likely clinically significant. There may be a small arachnoid cyst at the anterior middle cranial fossa on the right as well. Vascular: Major vessels at the base of the brain show flow. Skull and upper cervical spine: Negative Sinuses/Orbits: Clear/normal Other: None MRA HEAD FINDINGS Both internal carotid arteries are patent through the skull base. No siphon stenosis. The anterior and middle cerebral vessels are patent  without proximal stenosis, aneurysm or vascular malformation. Both vertebral arteries are patent to the basilar. In the V4 segment on the left, there is a question of a filling defect. This could be artifactual or could indicate a localized vertebral dissection. I would suggest CT angiography to evaluate this segment. IMPRESSION: Normal appearance of the brain for a person of this age. No acute infarction. Punctate focus of hemosiderin in the inferior cerebellum on the right that could be a tiny cavernoma or relate to an old small vessel insult no longer specifically visible. Brain does not show a pattern of widespread small-vessel disease. No large or medium vessel occlusion. Question abnormal V4 segment of the left vertebral artery. Localized dissection not excluded. I would suggest CT angiography to evaluate that. Electronically Signed   By: Nelson Chimes M.D.   On: 06/08/2018 10:40   Mr Brain Wo Contrast  Result Date: 06/08/2018 CLINICAL DATA:  Dizziness beginning yesterday. Vision loss on the right. EXAM: MRI HEAD WITHOUT CONTRAST MRA HEAD WITHOUT CONTRAST TECHNIQUE: Multiplanar, multiecho pulse sequences of the brain and surrounding structures were obtained without intravenous contrast. Angiographic images of the head were obtained using MRA technique without contrast. COMPARISON:  CT same day FINDINGS: MRI HEAD FINDINGS Brain: Diffusion imaging does  not show any acute or subacute infarction. The brainstem is normal. There is a focus of hemosiderin deposition in the inferior right cerebellum that could be associated with an old small vessel infarction or a tiny cavernoma. Cerebral hemispheres show mild volume loss but without old or recent small or large vessel infarction. No mass lesion, hemorrhage, hydrocephalus or subdural hematoma or hygroma. There is probably an arachnoid cyst along the left lateral margin of the cerebellum, not likely clinically significant. There may be a small arachnoid cyst at the  anterior middle cranial fossa on the right as well. Vascular: Major vessels at the base of the brain show flow. Skull and upper cervical spine: Negative Sinuses/Orbits: Clear/normal Other: None MRA HEAD FINDINGS Both internal carotid arteries are patent through the skull base. No siphon stenosis. The anterior and middle cerebral vessels are patent without proximal stenosis, aneurysm or vascular malformation. Both vertebral arteries are patent to the basilar. In the V4 segment on the left, there is a question of a filling defect. This could be artifactual or could indicate a localized vertebral dissection. I would suggest CT angiography to evaluate this segment. IMPRESSION: Normal appearance of the brain for a person of this age. No acute infarction. Punctate focus of hemosiderin in the inferior cerebellum on the right that could be a tiny cavernoma or relate to an old small vessel insult no longer specifically visible. Brain does not show a pattern of widespread small-vessel disease. No large or medium vessel occlusion. Question abnormal V4 segment of the left vertebral artery. Localized dissection not excluded. I would suggest CT angiography to evaluate that. Electronically Signed   By: Nelson Chimes M.D.   On: 06/08/2018 10:40   US Carotid Bilateral  Result Date: 06/08/2018 CLINICAL DATA:  68 year old male with TIA EXAM: BILATERAL CAROTID DUPLEX ULTRASOUND TECHNIQUE: Pearline Cables scale imaging, color Doppler and duplex ultrasound were performed of bilateral carotid and vertebral arteries in the neck. COMPARISON:  None. FINDINGS: Criteria: Quantification of carotid stenosis is based on velocity parameters that correlate the residual internal carotid diameter with NASCET-based stenosis levels, using the diameter of the distal internal carotid lumen as the denominator for stenosis measurement. The following velocity measurements were obtained: RIGHT ICA:  Systolic 92 cm/sec, Diastolic 21 cm/sec CCA:  010 cm/sec SYSTOLIC  ICA/CCA RATIO:  0.7 ECA:  124 cm/sec LEFT ICA:  Systolic 932 cm/sec, Diastolic 44 cm/sec CCA:  355 cm/sec SYSTOLIC ICA/CCA RATIO:  0.8 ECA:  128 cm/sec Right Brachial SBP: Not acquired Left Brachial SBP: Not acquired RIGHT CAROTID ARTERY: No significant calcified disease of the right common carotid artery. Intermediate waveform maintained. Heterogeneous plaque without significant calcifications at the right carotid bifurcation. Low resistance waveform of the right ICA. Tortuosity. RIGHT VERTEBRAL ARTERY: Antegrade flow with low resistance waveform. LEFT CAROTID ARTERY: No significant calcified disease of the left common carotid artery. Intermediate waveform maintained. Heterogeneous plaque at the left carotid bifurcation without significant calcifications. Low resistance waveform of the left ICA. Tortuosity LEFT VERTEBRAL ARTERY:  Antegrade flow with low resistance waveform. IMPRESSION: Color duplex indicates minimal heterogeneous plaque, with no hemodynamically significant stenosis by duplex criteria in the extracranial cerebrovascular circulation. Signed, Dulcy Fanny. Dellia Nims, RPVI [facility] Radiology Electronically Signed   By: Corrie Mckusick D.O.   On: 06/08/2018 10:51   Dg Chest Port 1 View  Result Date: 06/07/2018 CLINICAL DATA:  Presyncope EXAM: PORTABLE CHEST 1 VIEW COMPARISON:  04/02/2013 FINDINGS: Enlarged cardiomediastinal silhouette, likely augmented by portable technique. No focal  opacity or pleural effusion. No pneumothorax. IMPRESSION: No active disease.  Cardiomegaly Electronically Signed   By: Donavan Foil M.D.   On: 06/07/2018 21:40     Discharge Exam: Vitals:   06/08/18 0840 06/08/18 1240  BP: 119/75 110/62  Pulse: 78 64  Resp: 18 16  Temp: 98.3 F (36.8 C) 98 F (36.7 C)  SpO2: 95% 97%   Vitals:   06/08/18 0500 06/08/18 0640 06/08/18 0840 06/08/18 1240  BP:  105/64 119/75 110/62  Pulse:  67 78 64  Resp:  18 18 16   Temp:  98  F (36.7 C) 98.3 F (36.8 C) 98 F (36.7 C)  TempSrc:  Oral Oral Oral  SpO2:  98% 95% 97%  Weight: 132.8 kg     Height:        General: Pt is alert, awake, not in acute distress Cardiovascular: RRR, S1/S2 +, no rubs, no gallops Respiratory: CTA bilaterally, no wheezing, no rhonchi Abdominal: Soft, NT, ND, bowel sounds + Extremities: no edema, no cyanosis    The results of significant diagnostics from this hospitalization (including imaging, microbiology, ancillary and laboratory) are listed below for reference.     Microbiology: Recent Results (from the past 240 hour(s))  SARS Coronavirus 2 (CEPHEID - Performed in [place] hospital lab), Hosp Order     Status: None   Collection Time: 06/08/18  1:12 AM  Result Value Ref Range Status   SARS Coronavirus 2 NEGATIVE NEGATIVE Final    Comment: (NOTE) If result is NEGATIVE SARS-CoV-2 target nucleic acids are NOT DETECTED. The SARS-CoV-2 RNA is generally detectable in upper and lower  respiratory specimens during the acute phase of infection. The lowest  concentration of SARS-CoV-2 viral copies this assay can detect is 250  copies / mL. A negative result does not preclude SARS-CoV-2 infection  and should not be used as the sole basis for treatment or other  patient management decisions.  A negative result may occur with  improper specimen collection / handling, submission of specimen other  than nasopharyngeal swab, presence of viral mutation(s) within the  areas targeted by this assay, and inadequate number of viral copies  (<250 copies / mL). A negative result must be combined with clinical  observations, patient history, and epidemiological information. If result is POSITIVE SARS-CoV-2 target nucleic acids are DETECTED. The SARS-CoV-2 RNA is generally detectable in upper and lower  respiratory specimens dur ing the acute phase of infection.  Positive  results are indicative of active infection with SARS-CoV-2.  Clinical   correlation with patient history and other diagnostic information is  necessary to determine patient infection status.  Positive results do  not rule out bacterial infection or co-infection with other viruses. If result is PRESUMPTIVE POSTIVE SARS-CoV-2 nucleic acids MAY BE PRESENT.   A presumptive positive result was obtained on the submitted specimen  and confirmed on repeat testing.  While 2019 novel coronavirus  (SARS-CoV-2) nucleic acids may be present in the submitted sample  additional confirmatory testing may be necessary for epidemiological  and / or clinical management purposes  to differentiate between  SARS-CoV-2 and other Sarbecovirus currently known to infect humans.  If clinically indicated additional testing with an alternate test  methodology [phone]) is advised. The SARS-CoV-2 RNA is generally  detectable in upper and lower respiratory sp ecimens during the acute  phase of infection. The expected result is Negative. Fact Sheet for Patients:  StrictlyIdeas.no Fact Sheet for Healthcare Providers: BankingDealers.co.za This test is not yet approved  or cleared by the [place] and has been authorized for detection and/or diagnosis of SARS-CoV-2 by FDA under an Emergency Use Authorization (EUA).  This EUA will remain in effect (meaning this test can be used) for the duration of the COVID-19 declaration under Section 564(b)(1) of the Act, 21 U.S.C. section 360bbb-3(b)(1), unless the authorization is terminated or revoked sooner. Performed at [facility], [address] [place], [place] [address]      Labs: BNP (last 3 results) Recent Labs    06/07/18 2131  BNP 93.8   Basic Metabolic Panel: Recent Labs  Lab 06/07/18 2131 06/08/18 0025 06/08/18 0634  NA 138  --  140  K 3.3*  --  3.6  CL 103  --  106  CO2 23  --  24  GLUCOSE 125*  --  111*  BUN 14  --  13  CREATININE 1.03  --  0.81  CALCIUM 9.1  --   8.7*  MG  --  1.9  --    Liver Function Tests: Recent Labs  Lab 06/08/18 0634  AST 13*  ALT 15  ALKPHOS 51  BILITOT 0.9  PROT 5.7*  ALBUMIN 3.2*   No results for input(s): LIPASE, AMYLASE in the last 168 hours. No results for input(s): AMMONIA in the last 168 hours. CBC: Recent Labs  Lab 06/07/18 2131 06/08/18 0634  WBC 11.2* 6.8  NEUTROABS 9.6*  --   HGB 14.2 13.3  HCT 41.8 38.7*  MCV 96.5 94.2  PLT 181 175   Cardiac Enzymes: Recent Labs  Lab 06/07/18 2131 06/08/18 0025 06/08/18 0634  TROPONINI <0.03 <0.03 <0.03   BNP: Invalid input(s): POCBNP CBG: No results for input(s): GLUCAP in the last 168 hours. D-Dimer No results for input(s): DDIMER in the last 72 hours. Hgb A1c No results for input(s): HGBA1C in the last 72 hours. Lipid Profile Recent Labs    06/08/18 0634  CHOL 129  HDL 31*  LDLCALC 81  TRIG 84  CHOLHDL 4.2   Thyroid function studies Recent Labs    06/08/18 0634  TSH 1.189   Anemia work up Recent Labs    06/08/18 0634  VITAMINB12 269   Urinalysis No results found for: COLORURINE, APPEARANCEUR, LABSPEC, [place], GLUCOSEU, [place], [place], [place], PROTEINUR, UROBILINOGEN, NITRITE, LEUKOCYTESUR Sepsis Labs Invalid input(s): PROCALCITONIN,  WBC,  LACTICIDVEN Microbiology Recent Results (from the past 240 hour(s))  SARS Coronavirus 2 (CEPHEID - Performed in [place] hospital lab), Hosp Order     Status: None   Collection Time: 06/08/18  1:12 AM  Result Value Ref Range Status   SARS Coronavirus 2 NEGATIVE NEGATIVE Final    Comment: (NOTE) If result is NEGATIVE SARS-CoV-2 target nucleic acids are NOT DETECTED. The SARS-CoV-2 RNA is generally detectable in upper and lower  respiratory specimens during the acute phase of infection. The lowest  concentration of SARS-CoV-2 viral copies this assay can detect is 250  copies / mL. A negative result does not preclude SARS-CoV-2 infection  and should not be used as the sole basis  for treatment or other  patient management decisions.  A negative result may occur with  improper specimen collection / handling, submission of specimen other  than nasopharyngeal swab, presence of viral mutation(s) within the  areas targeted by this assay, and inadequate number of viral copies  (<250 copies / mL). A negative result must be combined with clinical  observations, patient history, and epidemiological information. If result is POSITIVE SARS-CoV-2 target nucleic acids  are DETECTED. The SARS-CoV-2 RNA is generally detectable in upper and lower  respiratory specimens dur ing the acute phase of infection.  Positive  results are indicative of active infection with SARS-CoV-2.  Clinical  correlation with patient history and other diagnostic information is  necessary to determine patient infection status.  Positive results do  not rule out bacterial infection or co-infection with other viruses. If result is PRESUMPTIVE POSTIVE SARS-CoV-2 nucleic acids MAY BE PRESENT.   A presumptive positive result was obtained on the submitted specimen  and confirmed on repeat testing.  While 2019 novel coronavirus  (SARS-CoV-2) nucleic acids may be present in the submitted sample  additional confirmatory testing may be necessary for epidemiological  and / or clinical management purposes  to differentiate between  SARS-CoV-2 and other Sarbecovirus currently known to infect humans.  If clinically indicated additional testing with an alternate test  methodology [phone]) is advised. The SARS-CoV-2 RNA is generally  detectable in upper and lower respiratory sp ecimens during the acute  phase of infection. The expected result is Negative. Fact Sheet for Patients:  StrictlyIdeas.no Fact Sheet for Healthcare Providers: BankingDealers.co.za This test is not yet approved or cleared by the [place] FDA and has been authorized for detection and/or  diagnosis of SARS-CoV-2 by FDA under an Emergency Use Authorization (EUA).  This EUA will remain in effect (meaning this test can be used) for the duration of the COVID-19 declaration under Section 564(b)(1) of the Act, 21 U.S.C. section 360bbb-3(b)(1), unless the authorization is terminated or revoked sooner. Performed at [facility], [address], [place], [place] [address]      Time coordinating discharge: 35 minutes  SIGNED:   Rodena Goldmann, DO [facility] 06/08/2018, 12:53 PM  If 7PM-7AM, please contact night-coverage [email or web address] Password [email or web address]

## 2018-06-08 NOTE — Progress Notes (Signed)
OT Cancellation Note  Patient Details Name: Raymond Hurst MRN: [id number] DOB: 04-19-1950   Cancelled Treatment:    Reason Eval/Treat Not Completed: OT screened, no needs identified, will sign off. Pt completing ADLs independently, is at baseline with functional task completion. No further OT services required at this time.   Guadelupe Sabin, OTR/L  [phone] 06/08/2018, 8:47 AM

## 2018-06-08 NOTE — ED Notes (Signed)
Waiting for COVID/SARS rapid test to result before patient goes to the floor.

## 2018-06-09 LAB — HIV ANTIBODY (ROUTINE TESTING W REFLEX): HIV Screen 4th Generation wRfx: NONREACTIVE

## 2018-06-11 ENCOUNTER — Telehealth (HOSPITAL_COMMUNITY): Payer: Self-pay

## 2018-06-11 LAB — FOLATE RBC
Folate, Hemolysate: 417 ng/mL
Folate, RBC: 1146 ng/mL (ref >498)
Hematocrit: 36.4 % — ABNORMAL LOW (ref 37.5–51.0)

## 2018-06-11 NOTE — Telephone Encounter (Signed)
Per OT note/message and speaking with her today, pt was admitted to hospital late last week with afib and possible TIA. PT called to discuss his outpatient PT treatment options going forward but he did not answer so LVM for pt to return call to office.   Geraldine Solar PT, DPT

## 2018-06-12 DIAGNOSIS — R002 Palpitations: Secondary | ICD-10-CM | POA: Diagnosis not present

## 2018-06-12 DIAGNOSIS — Z09 Encounter for follow-up examination after completed treatment for conditions other than malignant neoplasm: Secondary | ICD-10-CM | POA: Diagnosis not present

## 2018-06-12 LAB — ANA: Anti Nuclear Antibody (ANA): NEGATIVE

## 2018-06-15 ENCOUNTER — Other Ambulatory Visit: Payer: Self-pay

## 2018-06-15 ENCOUNTER — Encounter (HOSPITAL_COMMUNITY): Payer: Self-pay

## 2018-06-15 ENCOUNTER — Telehealth: Payer: Self-pay | Admitting: *Deleted

## 2018-06-15 ENCOUNTER — Telehealth (HOSPITAL_COMMUNITY): Payer: Self-pay

## 2018-06-15 ENCOUNTER — Ambulatory Visit (HOSPITAL_COMMUNITY): Payer: Medicare HMO

## 2018-06-15 DIAGNOSIS — M6281 Muscle weakness (generalized): Secondary | ICD-10-CM | POA: Diagnosis not present

## 2018-06-15 DIAGNOSIS — R262 Difficulty in walking, not elsewhere classified: Secondary | ICD-10-CM

## 2018-06-15 DIAGNOSIS — M545 Low back pain, unspecified: Secondary | ICD-10-CM

## 2018-06-15 NOTE — Telephone Encounter (Signed)
REFERRAL SENT TO [facility] AND NOTES ON FILE FROM [place], [place].

## 2018-06-15 NOTE — Telephone Encounter (Signed)
I called Mr. Perusse and spoke with him directly. I offered him an earlier spot to come into the office at 2:30 for his visit. He agreed to do so. I have moved his appointment up.   Kipp Brood, PT, DPT, [facility] Physical Therapist with [facility]  06/15/2018 12:27 PM

## 2018-06-15 NOTE — Therapy (Addendum)
[place], [place], [address] Phone: [phone]   Fax:  [phone]  Physical Therapy Treatment  Patient Details  Name: Raymond Hurst MRN: [id number] Date of Birth: Sep 19, 1950 Referring Provider (PT): Suella Broad, MD   Encounter Date: 06/15/2018  PT End of Session - 06/15/18 1507    Visit Number  7    Number of Visits  10    Date for PT Re-Evaluation  06/21/18    Authorization Type  [employer]    Authorization Time Period  05/11/18 to 06/08/18; NEW: 06/07/18 to 06/21/18    Authorization - Visit Number  1    Authorization - Number of Visits  10    PT Start Time  3810    PT Stop Time  1515    PT Time Calculation (min)  44 min    Activity Tolerance  Patient tolerated treatment well    Behavior During Therapy  [facility] for tasks assessed/performed       Past Medical History:  Diagnosis Date  . Cellulitis of right lower leg 04/04/2013  . Lymphedema of leg 04/08/2013    Past Surgical History:  Procedure Laterality Date  . INNER EAR SURGERY    . ORTHOPEDIC SURGERY      There were no vitals filed for this visit.  Subjective Assessment - 06/15/18 1440    Subjective  Patient arrives reporting he feels ok now after returning home from the hospital. He was seen for A-fib the night after his last therapy session  last Thursday. He denies any chest pain, SOB, dizziness, etc at start of session. He reports he almost cancelled this appointment as well but learned that to be approved for an MRI he must participate in 6 consecutive weeks of physical therapy. He reports his back pain is about the same as always, 5/10.    Limitations  Walking    How long can you sit comfortably?  no issues    How long can you stand comfortably?  30-91mins    How long can you walk comfortably?  1 hour    Diagnostic tests  x-rays were negative    Patient Stated Goals  get back to near normal    Currently in Pain?  Yes    Pain Score  5      Pain Location  Back    Pain Orientation  Lower;Right    Pain Descriptors / Indicators  Aching;Dull    Pain Type  Acute pain    Pain Onset  1 to 4 weeks ago    Pain Frequency  Constant        [facility] Adult PT Treatment/Exercise - 06/15/18 0001      Lumbar Exercises: Stretches   Single Knee to Chest Stretch  Right;Left;30 seconds;3 reps    Single Knee to Chest Stretch Limitations  hooklying with sheet    Standing Extension  5 seconds;10 reps    Quadruped Mid Back Stretch Limitations  pec stretch in corner 10x10"      Lumbar Exercises: Standing   Other Standing Lumbar Exercises  Wall arch: 10 reps 3 sec holds      Manual Therapy   Manual Therapy  Joint mobilization    Manual therapy comments  separate rest of treatment    Joint Mobilization  Lateral glide to L3, Grade IV, bil, for ~ 6 minutes each side         PT Education - 06/15/18 1653  Education Details  exercise techqnieu/purpose    Person(s) Educated  Patient    Methods  Explanation    Comprehension  Verbalized understanding       PT Short Term Goals - 06/07/18 1536      PT SHORT TERM GOAL #1   Title  Pt will have reduced pain to 4/10 in the mornings on a daily basis in order to demo overall improved function and maximize his ability to perform ADLs with greater ease.     Baseline  5/7: 5/10 LBP in the mornings    Time  2    Period  Weeks    Status  On-going    Target Date  05/25/18      PT SHORT TERM GOAL #2   Title  Pt will report being able to walk for 2 hours in order to allow him to go grocery shopping with greater ease and with less pain.     Baseline  5/7: averages 45-66mins    Time  2    Period  Weeks    Status  On-going      PT SHORT TERM GOAL #3   Title  Pt will be able to perform bil SLS for 5 sec to demo improved core and functional hip strength in order to maximize gait.    Baseline  5/7: 2.45sec on R, 2sec on L    Time  2    Period  Weeks    Status  On-going        PT Long Term Goals -  06/07/18 1537      PT LONG TERM GOAL #1   Title  Pt will report reduced pain to 2/10 or < 3 mornings out of the week or better to demo improved overall function and overall flexibility.     Baseline  5/7: 5/10 LBP in the mornings    Time  4    Period  Weeks    Status  On-going      PT LONG TERM GOAL #2   Title  Pt will report being able to walk for unlimited amounts of time/distance to demo improved overall core and functional strength and return to PLOF.    Baseline  5/7: averages 45-84mins    Time  4    Period  Weeks    Status  On-going      PT LONG TERM GOAL #3   Title  Pt will be able to perform bil SLS for 10 sec to further demo improved functional strength and balance of bil hips in order to allow him to walk for unlimited distances.     Baseline  5/7: 2.45sec on R, 2sec on L    Time  4    Period  Weeks    Status  On-going         Plan - 06/15/18 1507    Clinical Impression Statement  Patient returning to therapy after admission to hospital for A-Fib last Thursday. He reports he is doing well now and denied all symptoms. Assessed BP at start of session and found patient to be slightly elevated at 130/82 mmHg. Session focused on mobility with joint mobilization to lumbar spine and stretching. Patient reported reduced pain with lateral glide technique and stated pain at 4/10 at EOS. He will continue to benefit form skilled PT interventions to address impairments.     Personal Factors and Comorbidities  Age;Comorbidity 1    Examination-Activity Limitations  Bed Mobility;Locomotion Level;Stand  Examination-Participation Restrictions  Community Activity;Shop    Stability/Clinical Decision Making  Stable/Uncomplicated    Rehab Potential  Good    PT Frequency  2x / week    PT Duration  2 weeks    PT Treatment/Interventions  ADLs/Self Care Home Management;Aquatic Therapy;Cryotherapy;Gait training;Stair training;Functional mobility training;Therapeutic activities;Therapeutic  exercise;Balance training;Neuromuscular re-education;Patient/family education;Orthotic Fit/Training;Manual techniques;Passive range of motion;Dry needling;Taping;Spinal Manipulations;Ultrasound;Electrical Stimulation    PT Next Visit Plan  continue QL stretching, hip distraction; continue lumbar stretching/ROM, thoracic mobility, core, hip, functional strengthening, balance training; and manual therapy for joint mobility and soft tissue restrictions     PT Home Exercise Plan  4/23: changed to decompresison 1-5, 4/27: deep breathing with UE ext into table    Consulted and Agree with Plan of Care  Patient       Patient will benefit from skilled therapeutic intervention in order to improve the following deficits and impairments:  Decreased balance, Decreased range of motion, Decreased strength, Difficulty walking, Hypomobility, Increased fascial restricitons, Increased muscle spasms, Impaired flexibility, Improper body mechanics, Postural dysfunction, Pain  Visit Diagnosis: Acute bilateral low back pain without sciatica  Difficulty in walking, not elsewhere classified  Muscle weakness (generalized)     Problem List Patient Active Problem List   Diagnosis Date Noted  . Dizziness 06/08/2018  . TIA (transient ischemic attack) 06/08/2018  . Unspecified atrial fibrillation ([place]) 06/08/2018  . Near syncope 06/07/2018  . Hypokalemia 06/07/2018  . Lymphedema of leg 04/08/2013  . Cellulitis of right lower leg 04/04/2013  . Obesity, unspecified 04/04/2013  . Alcohol abuse 04/04/2013  . Cellulitis and abscess of leg 04/04/2013    Kipp Brood, PT, DPT, [facility] Physical Therapist with [facility]  06/15/2018 4:54 PM    [place] [address] [place], [place], [address] Phone: [phone]   Fax:  [phone]  Name: Raymond Hurst MRN: [id number] Date of Birth: January 14, 1951

## 2018-06-18 ENCOUNTER — Ambulatory Visit (HOSPITAL_COMMUNITY): Payer: Medicare HMO

## 2018-06-18 ENCOUNTER — Other Ambulatory Visit: Payer: Self-pay

## 2018-06-18 ENCOUNTER — Encounter (HOSPITAL_COMMUNITY): Payer: Self-pay

## 2018-06-18 DIAGNOSIS — M545 Low back pain, unspecified: Secondary | ICD-10-CM

## 2018-06-18 DIAGNOSIS — R262 Difficulty in walking, not elsewhere classified: Secondary | ICD-10-CM

## 2018-06-18 DIAGNOSIS — M6281 Muscle weakness (generalized): Secondary | ICD-10-CM | POA: Diagnosis not present

## 2018-06-18 NOTE — Therapy (Signed)
[place], [place], [address] Phone: [phone]   Fax:  [phone]  Physical Therapy Treatment  Patient Details  Name: Raymond Hurst MRN: [id number] Date of Birth: 1950/02/25 Referring Provider (PT): Suella Broad, MD   Encounter Date: 06/18/2018  PT End of Session - 06/18/18 1531    Visit Number  8    Number of Visits  10    Date for PT Re-Evaluation  06/21/18    Authorization Type  [employer]    Authorization Time Period  05/11/18 to 06/08/18; NEW: 06/07/18 to 06/21/18    Authorization - Visit Number  2    Authorization - Number of Visits  10    PT Start Time  1532    PT Stop Time  1615    PT Time Calculation (min)  43 min    Activity Tolerance  Patient tolerated treatment well    Behavior During Therapy  [facility] for tasks assessed/performed       Past Medical History:  Diagnosis Date  . Cellulitis of right lower leg 04/04/2013  . Lymphedema of leg 04/08/2013    Past Surgical History:  Procedure Laterality Date  . INNER EAR SURGERY    . ORTHOPEDIC SURGERY      There were no vitals filed for this visit.  Subjective Assessment - 06/18/18 1532    Subjective  Pt states that he is overall feeling better since his hospital admission recently. He states he has a "kink in my R side" right now but states that he doesn't have any pain on the L at the moment.     Limitations  Walking    How long can you sit comfortably?  no issues    How long can you stand comfortably?  30-45mins    How long can you walk comfortably?  1 hour    Diagnostic tests  x-rays were negative    Patient Stated Goals  get back to near normal    Currently in Pain?  Yes    Pain Score  4     Pain Location  Back    Pain Orientation  Lower;Right    Pain Descriptors / Indicators  Aching;Dull    Pain Type  Acute pain    Pain Onset  1 to 4 weeks ago    Pain Frequency  Constant    Aggravating Factors   walking long periods, waking up in  the mornings    Pain Relieving Factors  heating pad, ice packs, twisting first thing in AM    Effect of Pain on Daily Activities  mild increase            [facility] Adult PT Treatment/Exercise - 06/18/18 0001      Lumbar Exercises: Stretches   Quadruped Mid Back Stretch Limitations  pec stretch in corner 10x10"      Lumbar Exercises: Machines for Strengthening   Other Lumbar Machine Exercise  lat pulldowns at multigym 30# x10 reps each (min cues for form)      Lumbar Exercises: Standing   Side Lunge Limitations  bil tandem stance firm 3x8-10" holds each    Push / Pull Sled  bil tandem stance firm +paloff press RTB 2x10 reps each foot forward    Row  Both;10 reps    Theraband Level (Row)  Level 3 (Green)    Row Limitations  2 sets, min cues for form    Shoulder Extension  Both;10 reps  Theraband Level (Shoulder Extension)  Level 3 (Green)    Shoulder Extension Limitations  2 sets, min cues for form    Shoulder Adduction Limitations  horiz abd/band pulls GTB 2x10 (min cues for form)    Other Standing Lumbar Exercises  fwd and to the L Wall arch: 10 reps 3 sec holds (to stretch lower back and R sided lower back)    Other Standing Lumbar Exercises  bil SLS x5 reps each LE max of 2-3" each;      Manual Therapy   Manual Therapy  Joint mobilization;Soft tissue mobilization;Manual Traction    Manual therapy comments  separate rest of treatment    Joint Mobilization  CPA to L1-3 with pt in L sidelying (pt preferred position) to reduce pain and mm spasm    Soft tissue mobilization  STM to R lumbar paraspinals and QL to reduce pain and restrictions    Manual Traction  R hip long axis distraction for QL stretch 2x30"           PT Education - 06/18/18 1532    Education Details  exercise technique, continue HEP    Person(s) Educated  Patient    Methods  Explanation;Demonstration    Comprehension  Verbalized understanding;Returned demonstration       PT Short Term Goals - 06/07/18  1536      PT SHORT TERM GOAL #1   Title  Pt will have reduced pain to 4/10 in the mornings on a daily basis in order to demo overall improved function and maximize his ability to perform ADLs with greater ease.     Baseline  5/7: 5/10 LBP in the mornings    Time  2    Period  Weeks    Status  On-going    Target Date  05/25/18      PT SHORT TERM GOAL #2   Title  Pt will report being able to walk for 2 hours in order to allow him to go grocery shopping with greater ease and with less pain.     Baseline  5/7: averages 45-27mins    Time  2    Period  Weeks    Status  On-going      PT SHORT TERM GOAL #3   Title  Pt will be able to perform bil SLS for 5 sec to demo improved core and functional hip strength in order to maximize gait.    Baseline  5/7: 2.45sec on R, 2sec on L    Time  2    Period  Weeks    Status  On-going        PT Long Term Goals - 06/07/18 1537      PT LONG TERM GOAL #1   Title  Pt will report reduced pain to 2/10 or < 3 mornings out of the week or better to demo improved overall function and overall flexibility.     Baseline  5/7: 5/10 LBP in the mornings    Time  4    Period  Weeks    Status  On-going      PT LONG TERM GOAL #2   Title  Pt will report being able to walk for unlimited amounts of time/distance to demo improved overall core and functional strength and return to PLOF.    Baseline  5/7: averages 45-74mins    Time  4    Period  Weeks    Status  On-going      PT LONG  TERM GOAL #3   Title  Pt will be able to perform bil SLS for 10 sec to further demo improved functional strength and balance of bil hips in order to allow him to walk for unlimited distances.     Baseline  5/7: 2.45sec on R, 2sec on L    Time  4    Period  Weeks    Status  On-going            Plan - 06/18/18 1626    Clinical Impression Statement  Session focused on overall mobility, core and functional strengthening, as well as manual techniques to reduce pain. Continued  with wall arch and added L side lean to it this date to stretch R QL more since pt c/o "kink" in that area this date. Initiated postural strengthening to promote better upright posture as well as core strength. Pt continues to be challenged with balance activities. Allowed pt to have more frequent rest break this date in between exercises due to fatigue. Ended with manual with pt in sidelying as he preferred this position; STM focused on R lumbar paraspinals and over QL mm as he had increased restrictions in this area and pinpointed it where he felt the kink. Pt reporting reduced pain at EOS. Continue as planned, progressing as able.     Personal Factors and Comorbidities  Age;Comorbidity 1    Examination-Activity Limitations  Bed Mobility;Locomotion Level;Stand    Examination-Participation Restrictions  Community Activity;Shop    Stability/Clinical Decision Making  Stable/Uncomplicated    Rehab Potential  Good    PT Frequency  2x / week    PT Duration  2 weeks    PT Treatment/Interventions  ADLs/Self Care Home Management;Aquatic Therapy;Cryotherapy;Gait training;Stair training;Functional mobility training;Therapeutic activities;Therapeutic exercise;Balance training;Neuromuscular re-education;Patient/family education;Orthotic Fit/Training;Manual techniques;Passive range of motion;Dry needling;Taping;Spinal Manipulations;Ultrasound;Electrical Stimulation    PT Next Visit Plan  reassessment; continue QL stretching, hip distraction; continue lumbar stretching/ROM, thoracic mobility, core, hip, functional strengthening, balance training; and manual therapy for joint mobility and soft tissue restrictions     PT Home Exercise Plan  4/23: changed to decompresison 1-5, 4/27: deep breathing with UE ext into table    Consulted and Agree with Plan of Care  Patient       Patient will benefit from skilled therapeutic intervention in order to improve the following deficits and impairments:  Decreased balance,  Decreased range of motion, Decreased strength, Difficulty walking, Hypomobility, Increased fascial restricitons, Increased muscle spasms, Impaired flexibility, Improper body mechanics, Postural dysfunction, Pain  Visit Diagnosis: Acute bilateral low back pain without sciatica  Difficulty in walking, not elsewhere classified  Muscle weakness (generalized)     Problem List Patient Active Problem List   Diagnosis Date Noted  . Dizziness 06/08/2018  . TIA (transient ischemic attack) 06/08/2018  . Unspecified atrial fibrillation ([place]) 06/08/2018  . Near syncope 06/07/2018  . Hypokalemia 06/07/2018  . Lymphedema of leg 04/08/2013  . Cellulitis of right lower leg 04/04/2013  . Obesity, unspecified 04/04/2013  . Alcohol abuse 04/04/2013  . Cellulitis and abscess of leg 04/04/2013        Geraldine Solar PT, DPT   [place] [address] [place], [place], [address] Phone: [phone]   Fax:  [phone]  Name: ANAV LAMMERT MRN: [id number] Date of Birth: 07-28-1950

## 2018-06-19 ENCOUNTER — Other Ambulatory Visit: Payer: Self-pay | Admitting: *Deleted

## 2018-06-19 DIAGNOSIS — R002 Palpitations: Secondary | ICD-10-CM

## 2018-06-20 ENCOUNTER — Telehealth: Payer: Self-pay | Admitting: *Deleted

## 2018-06-20 NOTE — Telephone Encounter (Signed)
3 day ZIO XT long term holter monitor to be mailed to patients home.  Mat Carne PA-C ordered for 24 hours.  Patient advised to wear monitor at least 24 hours.  Instructions reviewed briefly as they are included in the monitor kit.

## 2018-06-22 ENCOUNTER — Ambulatory Visit (HOSPITAL_COMMUNITY): Payer: Medicare HMO

## 2018-06-22 ENCOUNTER — Other Ambulatory Visit: Payer: Self-pay

## 2018-06-22 ENCOUNTER — Encounter (HOSPITAL_COMMUNITY): Payer: Self-pay

## 2018-06-22 DIAGNOSIS — R262 Difficulty in walking, not elsewhere classified: Secondary | ICD-10-CM | POA: Diagnosis not present

## 2018-06-22 DIAGNOSIS — M6281 Muscle weakness (generalized): Secondary | ICD-10-CM

## 2018-06-22 DIAGNOSIS — M545 Low back pain, unspecified: Secondary | ICD-10-CM

## 2018-06-22 NOTE — Therapy (Signed)
[place], [place], [address] Phone: [phone]   Fax:  [phone]  Physical Therapy Treatment  Patient Details  Name: Raymond Hurst MRN: [id number] Date of Birth: 1950-12-26 Referring Provider (PT): Suella Broad, MD   Encounter Date: 06/22/2018  PT End of Session - 06/22/18 1542    Visit Number  9    Number of Visits  10    Date for PT Re-Evaluation  06/21/18    Authorization Type  [employer] H[employer]    Authorization Time Period  05/11/18 to 06/08/18; NEW: 06/07/18 to 06/21/18    Authorization - Visit Number  3    Authorization - Number of Visits  10    PT Start Time  7989   pt late   PT Stop Time  1600    PT Time Calculation (min)  35 min    Activity Tolerance  Patient tolerated treatment well    Behavior During Therapy  [facility] for tasks assessed/performed       Past Medical History:  Diagnosis Date  . Cellulitis of right lower leg 04/04/2013  . Lymphedema of leg 04/08/2013    Past Surgical History:  Procedure Laterality Date  . INNER EAR SURGERY    . ORTHOPEDIC SURGERY      There were no vitals filed for this visit.  Subjective Assessment - 06/22/18 1526    Subjective  Patient reports he is doing about the same as always. He states he is having about 4/10 pain in his lower back. He reports he is doing his HEP about 1x/day.    Limitations  Walking    How long can you sit comfortably?  no issues    How long can you stand comfortably?  30-58mins    How long can you walk comfortably?  1 hour    Diagnostic tests  x-rays were negative    Patient Stated Goals  get back to near normal    Currently in Pain?  Yes    Pain Score  4     Pain Orientation  Right;Posterior;Lower    Pain Descriptors / Indicators  Aching    Pain Type  Acute pain;Chronic pain    Pain Onset  1 to 4 weeks ago    Pain Frequency  Constant    Aggravating Factors   walking prolonged periods    Pain Relieving Factors  heating pad,  twisting back    Effect of Pain on Daily Activities  mild         [facility] Adult PT Treatment/Exercise - 06/22/18 0001      Lumbar Exercises: Stretches   Single Knee to Chest Stretch  Right;Left;5 reps;10 seconds    Single Knee to Chest Stretch Limitations  hooklying with sheet    Standing Extension  10 reps;10 seconds    Quadruped Mid Back Stretch Limitations  pec stretch in corner 10x10"      Lumbar Exercises: Aerobic   UBE (Upper Arm Bike)  8 minutes (1 min WU, 6x30/30, 1 min CD) at level 2      Lumbar Exercises: Standing   Row  Both;10 reps    Theraband Level (Row)  Level 3 (Green)    Row Limitations  2 sets    Shoulder Extension  Both;10 reps    Theraband Level (Shoulder Extension)  Level 3 (Green)    Shoulder Extension Limitations  2 sets      Manual Therapy   Manual Therapy  Manual  Traction    Manual Traction  Lt LE long axis distraction with grade IV oscilaitons to pump lumbar spine, 6 minutes       PT Education - 06/22/18 1547    Education Details  Educated on exercise purpose throughout. Encouraged to continue with HEP.    Person(s) Educated  Patient    Methods  Explanation;Demonstration    Comprehension  Verbalized understanding;Returned demonstration       PT Short Term Goals - 06/07/18 1536      PT SHORT TERM GOAL #1   Title  Pt will have reduced pain to 4/10 in the mornings on a daily basis in order to demo overall improved function and maximize his ability to perform ADLs with greater ease.     Baseline  5/7: 5/10 LBP in the mornings    Time  2    Period  Weeks    Status  On-going    Target Date  05/25/18      PT SHORT TERM GOAL #2   Title  Pt will report being able to walk for 2 hours in order to allow him to go grocery shopping with greater ease and with less pain.     Baseline  5/7: averages 45-59mins    Time  2    Period  Weeks    Status  On-going      PT SHORT TERM GOAL #3   Title  Pt will be able to perform bil SLS for 5 sec to demo improved  core and functional hip strength in order to maximize gait.    Baseline  5/7: 2.45sec on R, 2sec on L    Time  2    Period  Weeks    Status  On-going        PT Long Term Goals - 06/07/18 1537      PT LONG TERM GOAL #1   Title  Pt will report reduced pain to 2/10 or < 3 mornings out of the week or better to demo improved overall function and overall flexibility.     Baseline  5/7: 5/10 LBP in the mornings    Time  4    Period  Weeks    Status  On-going      PT LONG TERM GOAL #2   Title  Pt will report being able to walk for unlimited amounts of time/distance to demo improved overall core and functional strength and return to PLOF.    Baseline  5/7: averages 45-76mins    Time  4    Period  Weeks    Status  On-going      PT LONG TERM GOAL #3   Title  Pt will be able to perform bil SLS for 10 sec to further demo improved functional strength and balance of bil hips in order to allow him to walk for unlimited distances.     Baseline  5/7: 2.45sec on R, 2sec on L    Time  4    Period  Weeks    Status  On-going        Plan - 06/22/18 1545    Clinical Impression Statement  Initiated session with low back mobility exercises for stretching. Patient continues to have pain aggravated by transitional movements. He reported mild relief in supine for Lt LE long axis distraction to pump lumbar spine however upon standing pain developed again. Continued today with postural strengthening exercises and initiated aerobic exercises. Educated patient on benefit of aerobic exercise  for pain management and he exerted a good effort. Following aerobic exercise pt's HR reduced form 99 to 88 bpm after 1 minute rest. He will continue to benefit from skilled PT interventions to address impairments and reduce low back pain.    Personal Factors and Comorbidities  Age;Comorbidity 1    Examination-Activity Limitations  Bed Mobility;Locomotion Level;Stand    Examination-Participation Restrictions  Community  Activity;Shop    Stability/Clinical Decision Making  Stable/Uncomplicated    Rehab Potential  Good    PT Frequency  2x / week    PT Duration  2 weeks    PT Treatment/Interventions  ADLs/Self Care Home Management;Aquatic Therapy;Cryotherapy;Gait training;Stair training;Functional mobility training;Therapeutic activities;Therapeutic exercise;Balance training;Neuromuscular re-education;Patient/family education;Orthotic Fit/Training;Manual techniques;Passive range of motion;Dry needling;Taping;Spinal Manipulations;Ultrasound;Electrical Stimulation    PT Next Visit Plan  reassessment; continue QL stretching, hip distraction; continue lumbar stretching/ROM, thoracic mobility, core, hip, functional strengthening, balance training; and manual therapy for joint mobility and soft tissue restrictions     PT Home Exercise Plan  4/23: changed to decompresison 1-5, 4/27: deep breathing with UE ext into table    Consulted and Agree with Plan of Care  Patient       Patient will benefit from skilled therapeutic intervention in order to improve the following deficits and impairments:  Decreased balance, Decreased range of motion, Decreased strength, Difficulty walking, Hypomobility, Increased fascial restricitons, Increased muscle spasms, Impaired flexibility, Improper body mechanics, Postural dysfunction, Pain  Visit Diagnosis: Acute bilateral low back pain without sciatica  Difficulty in walking, not elsewhere classified  Muscle weakness (generalized)     Problem List Patient Active Problem List   Diagnosis Date Noted  . Dizziness 06/08/2018  . TIA (transient ischemic attack) 06/08/2018  . Unspecified atrial fibrillation ([place]) 06/08/2018  . Near syncope 06/07/2018  . Hypokalemia 06/07/2018  . Lymphedema of leg 04/08/2013  . Cellulitis of right lower leg 04/04/2013  . Obesity, unspecified 04/04/2013  . Alcohol abuse 04/04/2013  . Cellulitis and abscess of leg 04/04/2013    Kipp Brood,  PT, DPT, [facility] Physical Therapist with [facility]  06/22/2018 4:22 PM    [facility] [address] [place], [place], [address] Phone: [phone]   Fax:  [phone]  Name: Raymond Hurst MRN: [id number] Date of Birth: Jun 29, 1950

## 2018-06-28 ENCOUNTER — Encounter (HOSPITAL_COMMUNITY): Payer: Self-pay

## 2018-06-28 ENCOUNTER — Other Ambulatory Visit: Payer: Self-pay

## 2018-06-28 ENCOUNTER — Ambulatory Visit (HOSPITAL_COMMUNITY): Payer: Medicare HMO

## 2018-06-28 DIAGNOSIS — M6281 Muscle weakness (generalized): Secondary | ICD-10-CM | POA: Diagnosis not present

## 2018-06-28 DIAGNOSIS — R262 Difficulty in walking, not elsewhere classified: Secondary | ICD-10-CM

## 2018-06-28 DIAGNOSIS — M545 Low back pain, unspecified: Secondary | ICD-10-CM

## 2018-06-28 NOTE — Therapy (Signed)
[facility] [address] [place], [place], [address] Phone: [phone]   Fax:  [phone]   PHYSICAL THERAPY DISCHARGE SUMMARY  Visits from Start of Care: 10  Current functional level related to goals / functional outcomes: See below   Remaining deficits: See below   Education / Equipment: HEP  Plan: Patient agrees to discharge.  Patient goals were not met. Patient is being discharged due to lack of progress.  ?????    Physical Therapy Treatment  Patient Details  Name: Raymond Hurst MRN: [id number] Date of Birth: [birth year] Referring Provider (PT): Suella Broad, MD   Encounter Date: 06/28/2018  PT End of Session - 06/28/18 1032    Visit Number  10    Number of Visits  10    Date for PT Re-Evaluation  06/21/18    Authorization Type  [employer] H[employer]    Authorization Time Period  05/11/18 to 06/08/18; NEW: 06/07/18 to 06/21/18    Authorization - Visit Number  4    Authorization - Number of Visits  10    PT Start Time  1034    PT Stop Time  1050    PT Time Calculation (min)  16 min    Activity Tolerance  Patient tolerated treatment well    Behavior During Therapy  [facility] for tasks assessed/performed       Past Medical History:  Diagnosis Date  . Cellulitis of right lower leg 04/04/2013  . Lymphedema of leg 04/08/2013    Past Surgical History:  Procedure Laterality Date  . INNER EAR SURGERY    . ORTHOPEDIC SURGERY      There were no vitals filed for this visit.  Subjective Assessment - 06/28/18 1034    Subjective  Pt states that he is about the same, no real changes. Rates 4/10 lower back pain on the R side.     Limitations  Walking    How long can you sit comfortably?  no issues    How long can you stand comfortably?  30-87mns    How long can you walk comfortably?  1 hour    Diagnostic tests  x-rays were negative    Patient Stated Goals  get back to near normal    Currently in Pain?  Yes    Pain Score  4     Pain  Location  Back    Pain Orientation  Lower;Right    Pain Descriptors / Indicators  Aching    Pain Type  Acute pain;Chronic pain    Pain Onset  1 to 4 weeks ago    Pain Frequency  Constant    Aggravating Factors   walking prolonged periods    Pain Relieving Factors  heating pad, twisting back    Effect of Pain on Daily Activities  mild         [facility] PT Assessment - 06/28/18 0001      Assessment   Medical Diagnosis  degeneration of lumbar intervertebral disc, other degeneration of intervertebral disc, lumbar region    Referring Provider (PT)  RSuella Broad MD    Onset Date/Surgical Date  04/12/18    Next MD Visit  nothing scheduled right now    Prior Therapy  none for present issue, chiropractic work for this      Balance   Balance Assessed  Yes      Static Standing Balance   Static Standing - Balance Support  No upper extremity supported  Static Standing Balance -  Activities   Single Leg Stance - Right Leg;Single Leg Stance - Left Leg    Static Standing - Comment/# of Minutes  R: 2.4sec or < L: 2 sec or <   was 2.4 on R and 2 sec or < L          PT Education - 06/28/18 1031    Education Details  reassessment findings, d/c HEP, can return with referral if needed    Person(s) Educated  Patient    Methods  Explanation;Demonstration;Handout    Comprehension  Verbalized understanding;Returned demonstration          PT Short Term Goals - 06/28/18 1036      PT SHORT TERM GOAL #1   Title  Pt will have reduced pain to 4/10 in the mornings on a daily basis in order to demo overall improved function and maximize his ability to perform ADLs with greater ease.     Baseline  5/28: still rates it about 5/10 LBP    Time  2    Period  Weeks    Status  On-going    Target Date  05/25/18      PT SHORT TERM GOAL #2   Title  Pt will report being able to walk for 2 hours in order to allow him to go grocery shopping with greater ease and with less pain.     Baseline  5/28: still  averages 45-3mns    Time  2    Period  Weeks    Status  On-going      PT SHORT TERM GOAL #3   Title  Pt will be able to perform bil SLS for 5 sec to demo improved core and functional hip strength in order to maximize gait.    Baseline  5/28: 2.45sec on R, 2sec on L    Time  2    Period  Weeks    Status  On-going        PT Long Term Goals - 06/28/18 1037      PT LONG TERM GOAL #1   Title  Pt will report reduced pain to 2/10 or < 3 mornings out of the week or better to demo improved overall function and overall flexibility.     Baseline  5/28: still rates it about 5/10 LBP    Time  4    Period  Weeks    Status  On-going      PT LONG TERM GOAL #2   Title  Pt will report being able to walk for unlimited amounts of time/distance to demo improved overall core and functional strength and return to PLOF.    Baseline  5/28: still averages 45-660ms    Time  4    Period  Weeks    Status  On-going      PT LONG TERM GOAL #3   Title  Pt will be able to perform bil SLS for 10 sec to further demo improved functional strength and balance of bil hips in order to allow him to walk for unlimited distances.     Baseline  5/28: 2.45sec on R, 2sec on L    Time  4    Period  Weeks    Status  On-going            Plan - 06/28/18 1101    Clinical Impression Statement  PT reassessed pt's goals and outcome measures this date. Pt has made minimal to no  progress towards goals as illustrated above. He does report that he feels like his pain has reduced some in the mornings and that he can get moving a little bit quicker, but, he is still reporting mod-high pain scale every morning. His balance has remained unchanged and his walking is still limited due to pain. Multiple various treatment approaches have been taken with this pt with minimal to no success. Due to lack of progress, pt will be d/c at this time and PT encouraged him to f/u with his referring physician and he verbalized understanding. PT  also educated pt that he could return with referral if needed. Updated HEP to include thoracic mobility work, REIS, and postural strengthening.     Personal Factors and Comorbidities  Age;Comorbidity 1    Examination-Activity Limitations  Bed Mobility;Locomotion Level;Stand    Examination-Participation Restrictions  Community Activity;Shop    Stability/Clinical Decision Making  Stable/Uncomplicated    Rehab Potential  Good    PT Frequency  2x / week    PT Duration  2 weeks    PT Treatment/Interventions  ADLs/Self Care Home Management;Aquatic Therapy;Cryotherapy;Gait training;Stair training;Functional mobility training;Therapeutic activities;Therapeutic exercise;Balance training;Neuromuscular re-education;Patient/family education;Orthotic Fit/Training;Manual techniques;Passive range of motion;Dry needling;Taping;Spinal Manipulations;Ultrasound;Electrical Stimulation    PT Next Visit Plan  d/c to HEP    PT Home Exercise Plan  4/23: changed to decompresison 1-5, 4/27: deep breathing with UE ext into table; 5/28: REIS, 3D thoracic excursions, rowing and [place] ext with GTB    Consulted and Agree with Plan of Care  Patient       Patient will benefit from skilled therapeutic intervention in order to improve the following deficits and impairments:  Decreased balance, Decreased range of motion, Decreased strength, Difficulty walking, Hypomobility, Increased fascial restricitons, Increased muscle spasms, Impaired flexibility, Improper body mechanics, Postural dysfunction, Pain  Visit Diagnosis: Acute bilateral low back pain without sciatica  Difficulty in walking, not elsewhere classified  Muscle weakness (generalized)     Problem List Patient Active Problem List   Diagnosis Date Noted  . Dizziness 06/08/2018  . TIA (transient ischemic attack) 06/08/2018  . Unspecified atrial fibrillation ([place]) 06/08/2018  . Near syncope 06/07/2018  . Hypokalemia 06/07/2018  . Lymphedema of leg 04/08/2013  .  Cellulitis of right lower leg 04/04/2013  . Obesity, unspecified 04/04/2013  . Alcohol abuse 04/04/2013  . Cellulitis and abscess of leg 04/04/2013       Geraldine Solar PT, DPT   [facility] [address] [place], [place], [address] Phone: [phone]   Fax:  [phone]  Name: Raymond Hurst MRN: [id number] Date of Birth: 1950-05-16

## 2018-07-02 ENCOUNTER — Ambulatory Visit (HOSPITAL_COMMUNITY): Payer: Medicare HMO

## 2018-07-05 ENCOUNTER — Ambulatory Visit (HOSPITAL_COMMUNITY): Payer: Medicare HMO

## 2018-07-09 ENCOUNTER — Encounter (HOSPITAL_COMMUNITY): Payer: Medicare HMO

## 2018-07-12 ENCOUNTER — Encounter (HOSPITAL_COMMUNITY): Payer: Medicare HMO

## 2018-07-30 DIAGNOSIS — H903 Sensorineural hearing loss, bilateral: Secondary | ICD-10-CM | POA: Diagnosis not present

## 2018-09-21 DIAGNOSIS — M1712 Unilateral primary osteoarthritis, left knee: Secondary | ICD-10-CM | POA: Diagnosis not present

## 2018-11-08 DIAGNOSIS — Z20828 Contact with and (suspected) exposure to other viral communicable diseases: Secondary | ICD-10-CM | POA: Diagnosis not present

## 2019-02-05 DIAGNOSIS — D229 Melanocytic nevi, unspecified: Secondary | ICD-10-CM | POA: Diagnosis not present

## 2019-02-05 DIAGNOSIS — L821 Other seborrheic keratosis: Secondary | ICD-10-CM | POA: Diagnosis not present

## 2019-02-05 DIAGNOSIS — L720 Epidermal cyst: Secondary | ICD-10-CM | POA: Diagnosis not present

## 2019-04-23 DIAGNOSIS — Z136 Encounter for screening for cardiovascular disorders: Secondary | ICD-10-CM | POA: Diagnosis not present

## 2019-04-23 DIAGNOSIS — Z131 Encounter for screening for diabetes mellitus: Secondary | ICD-10-CM | POA: Diagnosis not present

## 2019-04-23 DIAGNOSIS — Z0000 Encounter for general adult medical examination without abnormal findings: Secondary | ICD-10-CM | POA: Diagnosis not present

## 2019-04-23 DIAGNOSIS — Z13220 Encounter for screening for lipoid disorders: Secondary | ICD-10-CM | POA: Diagnosis not present

## 2019-04-23 DIAGNOSIS — F1010 Alcohol abuse, uncomplicated: Secondary | ICD-10-CM | POA: Diagnosis not present

## 2019-04-23 DIAGNOSIS — R6889 Other general symptoms and signs: Secondary | ICD-10-CM | POA: Diagnosis not present

## 2019-04-23 DIAGNOSIS — Z6841 Body Mass Index (BMI) 40.0 and over, adult: Secondary | ICD-10-CM | POA: Diagnosis not present

## 2019-04-23 DIAGNOSIS — E559 Vitamin D deficiency, unspecified: Secondary | ICD-10-CM | POA: Diagnosis not present

## 2019-04-23 DIAGNOSIS — I499 Cardiac arrhythmia, unspecified: Secondary | ICD-10-CM | POA: Diagnosis not present

## 2019-04-29 ENCOUNTER — Encounter: Payer: Self-pay | Admitting: Cardiovascular Disease

## 2019-04-29 ENCOUNTER — Other Ambulatory Visit: Payer: Self-pay

## 2019-04-29 ENCOUNTER — Telehealth: Payer: Self-pay | Admitting: Cardiovascular Disease

## 2019-04-29 ENCOUNTER — Ambulatory Visit (INDEPENDENT_AMBULATORY_CARE_PROVIDER_SITE_OTHER): Payer: Medicare HMO | Admitting: Cardiovascular Disease

## 2019-04-29 VITALS — BP 134/64 | HR 70 | Ht 73.0 in | Wt 318.0 lb

## 2019-04-29 DIAGNOSIS — R0602 Shortness of breath: Secondary | ICD-10-CM

## 2019-04-29 DIAGNOSIS — I890 Lymphedema, not elsewhere classified: Secondary | ICD-10-CM

## 2019-04-29 DIAGNOSIS — I4891 Unspecified atrial fibrillation: Secondary | ICD-10-CM

## 2019-04-29 DIAGNOSIS — Z7189 Other specified counseling: Secondary | ICD-10-CM

## 2019-04-29 MED ORDER — APIXABAN 5 MG PO TABS: 5.0000 mg | ORAL_TABLET | Freq: Two times a day (BID) | ORAL | 0 refills | Status: DC

## 2019-04-29 MED ORDER — APIXABAN 5 MG PO TABS: 5.0000 mg | ORAL_TABLET | Freq: Two times a day (BID) | ORAL | 6 refills | Status: DC

## 2019-04-29 MED ORDER — METOPROLOL TARTRATE 25 MG PO TABS: 25.0000 mg | ORAL_TABLET | Freq: Two times a day (BID) | ORAL | 6 refills | Status: DC

## 2019-04-29 NOTE — Addendum Note (Signed)
Addended by: Laurine Blazer on: 04/29/2019 02:05 PM   Modules accepted: Orders

## 2019-04-29 NOTE — Telephone Encounter (Signed)
  Precert needed for:  Echo  Location: [facility]    Date: May 06, 2019

## 2019-04-29 NOTE — Patient Instructions (Addendum)
Medication Instructions:   Begin Lopressor 25 twice a day.  Begin Eliquis 5mg  twice a day.  Continue all other medications.    Labwork: none  Testing/Procedures:  Your physician has requested that you have an echocardiogram. Echocardiography is a painless test that uses sound waves to create images of your heart. It provides your doctor with information about the size and shape of your heart and how well your heart's chambers and valves are working. This procedure takes approximately one hour. There are no restrictions for this procedure.  Office will contact with results via phone or letter.    Follow-Up: 3 months   Any Other Special Instructions Will Be Listed Below (If Applicable). 1 month with Edrick Oh, RN (anticaogulation nurse) for new management of Eliquis.   If you need a refill on your cardiac medications before your next appointment, please call your pharmacy.

## 2019-04-29 NOTE — Progress Notes (Signed)
CARDIOLOGY CONSULT NOTE  Patient ID: Raymond Hurst MRN: [id number] DOB/AGE: July 25, 1950 69 y.o.  Admit date: (Not on file) Primary Physician: Patient, No Pcp Per  Reason for Consultation: Arrhythmia  HPI: OLAND Hurst is a 69 y.o. male who is being seen today for the evaluation of arrhythmia at the request of Joya Gaskins, Kristen M, [place].   I reviewed notes from his PCP.  He apparently has had cognitive issues with memory loss.  He is morbidly obese.  He drinks about 40 beers per week and has 2 to 3 glasses of scotch.  He has had increasing exertional dyspnea.  I reviewed recent labs which include white blood cell 6.7, hemoglobin 16.6, platelets 163, TSH 2.48, vitamin B12 125, vitamin D 11.9, BNP 863, hemoglobin A1c 5.2%.  An ECG was apparently performed at his PCPs office but this is presently unavailable to me.  Echocardiogram on 06/08/2018 demonstrated low normal LV systolic function, EF 50 to 55%.  There was mild LVH.  The RV was mildly enlarged with normal systolic function.  There was mild aortic regurgitation.  Carotid Dopplers in May 2020 showed no hemodynamically significant stenosis.  He is here with his wife who is also my patient.  He has apparently been having increasing exertional dyspnea over the past 2 months.  He is also felt like he had to take a deep breath while he is driving.  He denies exertional chest pain.  He initially lost some weight going on a keto diet but then put it back on after he stopped that particular diet.  He seldom has palpitations.  He does not smoke cigarettes.  He has a dry cough as well.  He denies orthopnea and paroxysmal nocturnal dyspnea.  He has chronic lymphedema and wears compression stockings and has done so for several years.  This has not gotten any worse lately.  ECG performed today which I personally reviewed demonstrates rapid atrial fibrillation, 104 bpm, with left anterior fascicular block.   Social history: His  wife is also my patient.  Family history: Mother developed CHF at age 61 and has since passed away.  No Known Allergies  Current Outpatient Medications  Medication Sig Dispense Refill  . ibuprofen (ADVIL) 200 MG tablet Take 200 mg by mouth every 6 (six) hours as needed.     No current facility-administered medications for this visit.    Past Medical History:  Diagnosis Date  . Cellulitis of right lower leg 04/04/2013  . Lymphedema of leg 04/08/2013    Past Surgical History:  Procedure Laterality Date  . INNER EAR SURGERY    . ORTHOPEDIC SURGERY      Social History   Socioeconomic History  . Marital status: Married    Spouse name: Not on file  . Number of children: Not on file  . Years of education: Not on file  . Highest education level: Not on file  Occupational History  . Not on file  Tobacco Use  . Smoking status: Former Research scientist (life sciences)  . Smokeless tobacco: Never Used  Substance and Sexual Activity  . Alcohol use: Yes    Comment: 4-5 beer daily  . Drug use: No  . Sexual activity: Not on file  Other Topics Concern  . Not on file  Social History Narrative  . Not on file   Social Determinants of Health   Financial Resource Strain:   . Difficulty of Paying Living Expenses:   Food Insecurity:   . Worried  About Running Out of Food in the Last Year:   . [place] in the Last Year:   Transportation Needs:   . Lack of Transportation (Medical):   Marland Kitchen Lack of Transportation (Non-Medical):   Physical Activity:   . Days of Exercise per Week:   . Minutes of Exercise per Session:   Stress:   . Feeling of Stress :   Social Connections:   . Frequency of Communication with Friends and Family:   . Frequency of Social Gatherings with Friends and Family:   . Attends Religious Services:   . Active Member of Clubs or Organizations:   . Attends Archivist Meetings:   Marland Kitchen Marital Status:   Intimate Partner Violence:   . Fear of Current or Ex-Partner:   . Emotionally  Abused:   Marland Kitchen Physically Abused:   . Sexually Abused:       Current Meds  Medication Sig  . ibuprofen (ADVIL) 200 MG tablet Take 200 mg by mouth every 6 (six) hours as needed.      Review of systems complete and found to be negative unless listed above in HPI    Physical exam Blood pressure 134/64, pulse 70, height 6\' 1"  (1.854 m), weight (!) 318 lb (144.2 kg), SpO2 96 %. General: Morbidly obese male in NAD Neck: No JVD, no thyromegaly or thyroid nodule.  Lungs: Clear to auscultation bilaterally with normal respiratory effort. CV: Nondisplaced PMI. Regular rate and irregular rhythm, normal S1/S2, no S3, no murmur.  Bilateral lower extremity edema (wearing compression stockings).  No carotid bruit.    Abdomen: Soft, nontender, obese.  Skin: Intact without lesions or rashes.  Neurologic: Alert and oriented x 3.  Psych: Normal affect. Extremities: No clubbing or cyanosis.  HEENT: Normal.   ECG: Most recent ECG reviewed.   Labs: Lab Results  Component Value Date/Time   K 3.6 06/08/2018 06:34 AM   BUN 13 06/08/2018 06:34 AM   CREATININE 0.81 06/08/2018 06:34 AM   ALT 15 06/08/2018 06:34 AM   TSH 1.189 06/08/2018 06:34 AM   HGB 13.3 06/08/2018 06:34 AM     Lipids: Lab Results  Component Value Date/Time   LDLCALC 81 06/08/2018 06:34 AM   CHOL 129 06/08/2018 06:34 AM   TRIG 84 06/08/2018 06:34 AM   HDL 31 (L) 06/08/2018 06:34 AM        ASSESSMENT AND PLAN:   1.  New onset atrial fibrillation with rapid ventricular response: Echocardiogram from 2020 reviewed above with low normal LV systolic function. I will obtain an echocardiogram to evaluate for interval changes cardiac structure and function. I will start metoprolol tartrate 25 mg twice daily.  He was hospitalized in May 2020 with a possible TIA.  MRI did not show any acute infarcts.  Nonetheless, I will start apixaban 5 mg twice daily for systemic anticoagulation.  2.  Shortness of breath: This is likely due to  new onset rapid atrial fibrillation in addition to weight gain superimposed on morbid obesity.  I will obtain an echocardiogram to evaluate cardiac structure and function.  I am starting Lopressor 25 mg twice daily for heart rate control.  3.  Chronic lymphedema: He has chronic lymphedema of bilateral lower extremities and wears compression stockings.    Disposition: Follow up in 3 months  Signed: Kate Sable, M.D., F.A.C.C.  04/29/2019, 1:28 PM

## 2019-05-02 ENCOUNTER — Encounter: Payer: Self-pay | Admitting: *Deleted

## 2019-05-06 ENCOUNTER — Ambulatory Visit (HOSPITAL_COMMUNITY)
Admission: RE | Admit: 2019-05-06 | Discharge: 2019-05-06 | Disposition: A | Discharge status: Home / Self Care | Payer: Medicare HMO | Source: Ambulatory Visit | Attending: Cardiovascular Disease | Admitting: Cardiovascular Disease

## 2019-05-06 ENCOUNTER — Other Ambulatory Visit: Payer: Self-pay

## 2019-05-06 DIAGNOSIS — R0602 Shortness of breath: Secondary | ICD-10-CM | POA: Diagnosis not present

## 2019-05-06 NOTE — Progress Notes (Signed)
*  PRELIMINARY RESULTS* Echocardiogram 2D Echocardiogram has been performed.  Raymond Hurst 05/06/2019, 12:58 PM

## 2019-05-08 DIAGNOSIS — M19071 Primary osteoarthritis, right ankle and foot: Secondary | ICD-10-CM | POA: Diagnosis not present

## 2019-05-08 DIAGNOSIS — M79671 Pain in right foot: Secondary | ICD-10-CM | POA: Diagnosis not present

## 2019-05-08 DIAGNOSIS — M67961 Unspecified disorder of synovium and tendon, right lower leg: Secondary | ICD-10-CM | POA: Diagnosis not present

## 2019-05-10 ENCOUNTER — Telehealth: Payer: Self-pay | Admitting: *Deleted

## 2019-05-10 NOTE — Telephone Encounter (Signed)
Laurine Blazer, [place]  579FGE 075-GRM AM EDT    Wife ([name]) notified. Copy to pcp. Follow up scheduled for July with Dr. Bronson Ing.    Laurine Blazer, [place]  579FGE 075-GRM AM EDT    Left message to return call.   Arnoldo Lenis, MD  05/07/2019 8:42 PM EDT    Echo overall shows normal heart function. Mild enlargement of his aorta, just something to be monitored at this time, nothing near any levely of concern   Zandra Abts MD

## 2019-05-31 ENCOUNTER — Telehealth: Payer: Self-pay | Admitting: Cardiovascular Disease

## 2019-05-31 NOTE — Telephone Encounter (Signed)
Pt did not know how much out of pocket expense would be at pharmacy - has 2 pills left - advised that they should see how much it would cost for at least a few days supply and would come by Monday to pick up BMS application and to get samples

## 2019-05-31 NOTE — Telephone Encounter (Signed)
Eliquis- patient cannot afford medication. Stated they used the card that helped with lowering price, but pharmacy told them it was only good for 30 days not 60 days

## 2019-06-03 MED ORDER — APIXABAN 5 MG PO TABS: 5.0000 mg | ORAL_TABLET | Freq: Two times a day (BID) | ORAL | 0 refills | Status: DC

## 2019-06-03 NOTE — Telephone Encounter (Signed)
Pt picked up samples and BMS application - will forward to nurse for f/u

## 2019-06-03 NOTE — Addendum Note (Signed)
Addended by: Julian Hy T on: 06/03/2019 03:25 PM   Modules accepted: Orders

## 2019-06-04 ENCOUNTER — Ambulatory Visit (INDEPENDENT_AMBULATORY_CARE_PROVIDER_SITE_OTHER): Payer: Medicare HMO | Admitting: *Deleted

## 2019-06-04 ENCOUNTER — Other Ambulatory Visit: Payer: Self-pay

## 2019-06-04 DIAGNOSIS — I4891 Unspecified atrial fibrillation: Secondary | ICD-10-CM

## 2019-06-04 DIAGNOSIS — Z5181 Encounter for therapeutic drug level monitoring: Secondary | ICD-10-CM

## 2019-06-04 DIAGNOSIS — Z7189 Other specified counseling: Secondary | ICD-10-CM

## 2019-06-04 NOTE — Progress Notes (Signed)
Pt was started on Eliquis 5mg  twice daily on 04/29/19 for atrial fibrillation.   Labs from 04/24/19:  Hgb 16.6,  Hct 50.0,  Plts 183, SrCr 0.92      Reviewed patients medication list.  Pt is not currently on any combined P-gp and strong CYP3A4 inhibitors/inducers (ketoconazole, traconazole, ritonavir, carbamazepine, phenytoin, rifampin, St. John's wort).  Reviewed labs from 06/05/19 @ [facility].  SCr 0.87  Weight 145kg, CrCl 168.98.  Dose is appropriate based on age, weight, and SCr.  Hgb and HCT: 16.8/49.8  Plts 163K  A full discussion of the nature of anticoagulants has been carried out.  A benefit/risk analysis has been presented to the patient, so that they understand the justification for choosing anticoagulation with Eliquis at this time.  The need for compliance is stressed.  Pt is aware to take the medication twice daily.  Side effects of potential bleeding are discussed, including unusual colored urine or stools, coughing up blood or coffee ground emesis, nose bleeds or serious fall or head trauma.  Discussed signs and symptoms of stroke. The patient should avoid any OTC items containing aspirin or ibuprofen.  Avoid alcohol consumption.   Call if any signs of abnormal bleeding.  Discussed financial obligations and resolved any difficulty in obtaining medication.  Next lab test in 6 months.    Called with lab results and f/u appt placed in recall for 6 months.

## 2019-06-13 ENCOUNTER — Other Ambulatory Visit: Payer: Self-pay

## 2019-06-13 ENCOUNTER — Other Ambulatory Visit (HOSPITAL_COMMUNITY)
Admission: RE | Admit: 2019-06-13 | Discharge: 2019-06-13 | Disposition: A | Discharge status: Home / Self Care | Payer: Medicare HMO | Source: Ambulatory Visit | Attending: Cardiovascular Disease | Admitting: Cardiovascular Disease

## 2019-06-13 DIAGNOSIS — Z5181 Encounter for therapeutic drug level monitoring: Secondary | ICD-10-CM | POA: Insufficient documentation

## 2019-06-13 DIAGNOSIS — I4891 Unspecified atrial fibrillation: Secondary | ICD-10-CM | POA: Diagnosis not present

## 2019-06-13 LAB — BASIC METABOLIC PANEL
Anion gap: 9 (ref 5–15)
BUN: 13 mg/dL (ref 8–23)
CO2: 25 mmol/L (ref 22–32)
Calcium: 9.2 mg/dL (ref 8.9–10.3)
Chloride: 104 mmol/L (ref 98–111)
Creatinine, Ser: 0.87 mg/dL (ref 0.61–1.24)
GFR calc Af Amer: >60 mL/min (ref >60)
GFR calc non Af Amer: >60 mL/min (ref >60)
Glucose, Bld: 86 mg/dL (ref 70–99)
Potassium: 3.9 mmol/L (ref 3.5–5.1)
Sodium: 138 mmol/L (ref 135–145)

## 2019-06-13 LAB — CBC
HCT: 49.8 % (ref 39.0–52.0)
Hemoglobin: 16.8 g/dL (ref 13.0–17.0)
MCH: 33.5 pg (ref 26.0–34.0)
MCHC: 33.7 g/dL (ref 30.0–36.0)
MCV: 99.4 fL (ref 80.0–100.0)
Platelets: 163 10*3/uL (ref 150–400)
RBC: 5.01 MIL/uL (ref 4.22–5.81)
RDW: 12.4 % (ref 11.5–15.5)
WBC: 6.6 10*3/uL (ref 4.0–10.5)
nRBC: 0 % (ref 0.0–0.2)

## 2019-07-02 ENCOUNTER — Telehealth: Payer: Self-pay | Admitting: Cardiovascular Disease

## 2019-07-02 ENCOUNTER — Other Ambulatory Visit: Payer: Self-pay | Admitting: *Deleted

## 2019-07-02 MED ORDER — APIXABAN 5 MG PO TABS: 5.0000 mg | ORAL_TABLET | Freq: Two times a day (BID) | ORAL | 0 refills | Status: DC

## 2019-07-02 NOTE — Telephone Encounter (Signed)
    1.  What medication and dosage are you requesting samples for?  ELIQUIS   2.  Are you currently out of this medication? Almost   Patient is trying to get assistance

## 2019-07-02 NOTE — Telephone Encounter (Signed)
Left detailed message that both [facility] were out of Eliquis 5 mg samples (have ordered but have not received) and if he wanted [place] to send in a few days worth to pharmacy to let us know

## 2019-07-12 ENCOUNTER — Telehealth: Payer: Self-pay | Admitting: *Deleted

## 2019-07-12 NOTE — Telephone Encounter (Signed)
The pt is on Eliquis.   Returned a call to the pt regarding the message he left about his finger bleeding; there was no answer so left a message for the pt to call back. In the message advised that if the finger is still bleeding after holding pressure he needs to seek medical attention at urgent care, primary care, or emergency care.  Called the alternate number listed in the message, which is, [phone] and was sent to voicemail that stated the a different name other than the pt so left a generic voicemail for the pt to call back for assisitance.

## 2019-07-12 NOTE — Telephone Encounter (Signed)
Pt requesting to speak coumadin nurse regarding small cut on his finger that has been bleeding for over an hour [phone]

## 2019-07-15 NOTE — Telephone Encounter (Signed)
Called and spoke with patient.  Discussed bleeding issues he had when he cut his finger.  Was able to control bleeding it just took a while.  Explained that ASA alone with AF was not enough to give him protection against blood clot/CVA.  Pt asked about decrease dose of Eliquis to 2.5mg  bid.  Explained that he does not meet criteria to decrease dose either.  Pt will continue with current dose of 5mg  bid and call back if he has any further issues.

## 2019-07-15 NOTE — Telephone Encounter (Signed)
Pt wife is concerned regarding pt being on Eliquis with how much he bled on Friday after a small cut in the end of his finger said it bled for over an hour - aware that pt needs to been Eliquis for stroke prevention with afib - requesting opinion of Edrick Oh, RN if pt can switch to just ASA daily - is concerned with possibility of bleeding so much

## 2019-07-15 NOTE — Telephone Encounter (Signed)
Wife returning call

## 2019-08-01 DIAGNOSIS — M1712 Unilateral primary osteoarthritis, left knee: Secondary | ICD-10-CM | POA: Diagnosis not present

## 2019-08-12 DIAGNOSIS — I890 Lymphedema, not elsewhere classified: Secondary | ICD-10-CM | POA: Diagnosis not present

## 2019-08-12 DIAGNOSIS — M25371 Other instability, right ankle: Secondary | ICD-10-CM | POA: Diagnosis not present

## 2019-08-14 ENCOUNTER — Ambulatory Visit: Payer: Medicare HMO | Admitting: Cardiovascular Disease

## 2019-08-19 ENCOUNTER — Encounter: Payer: Self-pay | Admitting: Cardiology

## 2019-08-19 NOTE — Progress Notes (Signed)
Cardiology Office Note  Date: 08/20/2019   ID: Aidden, Markovic 27-Oct-1950, MRN [id number]  PCP:  Raymond Graff, PA-C (Inactive)  Cardiologist:  Rozann Lesches, MD Electrophysiologist:  None   Chief Complaint  Patient presents with  . Cardiac follow-up    History of Present Illness: Raymond Hurst is a 69 y.o. male former patient of Dr. Bronson Ing now establishing follow-up with me.  I reviewed extensive records and updated the chart.  He was seen in consultation back in March with newly documented atrial fibrillation and CHA2DS2-VASc score of 3, started on metoprolol and Eliquis.  Telephone notes reviewed from June, patient concerned about bleeding on Eliquis after cut on his finger.  He does not report any other spontaneous bleeding however and per discussion today was comfortable remaining on Eliquis going forward.  We discussed the rationale for stroke prophylaxis.  He does not describe any palpitations with atrial fibrillation.  I personally reviewed his ECG today which shows atrial fibrillation at 84 bpm.  We also discussed his alcohol intake, recommended cutting back and no more than moderate use (typically for a male this is no more than 2 alcoholic drinks in a day).  I reviewed his baseline lab work from May.  Past Medical History:  Diagnosis Date  . Atrial fibrillation ([place])   . Cellulitis of right lower leg   . History of TIA (transient ischemic attack)    May 2020  . Lymphedema     Past Surgical History:  Procedure Laterality Date  . INNER EAR SURGERY    . ORTHOPEDIC SURGERY      Current Outpatient Medications  Medication Sig Dispense Refill  . apixaban (ELIQUIS) 5 MG TABS tablet Take 1 tablet (5 mg total) by mouth 2 (two) times daily. 14 tablet 0  . ibuprofen (ADVIL) 200 MG tablet Take 200 mg by mouth every 6 (six) hours as needed.    . metoprolol tartrate (LOPRESSOR) 25 MG tablet Take 1 tablet (25 mg total) by mouth 2 (two) times daily. 60 tablet 6     No current facility-administered medications for this visit.   Allergies:  Patient has no known allergies.   Social History: The patient  reports that he has quit smoking. His smoking use included cigarettes. He has never used smokeless tobacco. He reports current alcohol use. He reports that he does not use drugs.   Family History: The patient's family history includes Arrhythmia in his mother; Heart failure in his mother; Pulmonary fibrosis in his father.   ROS:   No syncope.  Physical Exam: VS:  BP 108/72   Pulse 84   Ht 6' (1.829 m)   Wt (!) 314 lb (142.4 kg)   SpO2 96%   BMI 42.59 kg/m , BMI Body mass index is 42.59 kg/m.  Wt Readings from Last 3 Encounters:  08/20/19 (!) 314 lb (142.4 kg)  04/29/19 (!) 318 lb (144.2 kg)  06/08/18 292 lb 12.3 oz (132.8 kg)    General: Patient appears comfortable at rest. HEENT: Conjunctiva and lids normal, wearing a mask. Neck: Supple, no elevated JVP or carotid bruits, no thyromegaly. Lungs: Clear to auscultation, nonlabored breathing at rest. Cardiac: Regularly irregular, no S3 or significant systolic murmur, no pericardial rub. Abdomen: Soft, bowel sounds present, no guarding or rebound. Extremities: Stable appearing lymphedema, distal pulses 2+.  ECG:  An ECG dated 04/29/2019 was personally reviewed today and demonstrated:  Atrial fibrillation with left anterior fascicular block.  Recent Labwork: 06/13/2019: BUN  13; Creatinine, Ser 0.87; Hemoglobin 16.8; Platelets 163; Potassium 3.9; Sodium 138     Component Value Date/Time   CHOL 129 06/08/2018 0634   TRIG 84 06/08/2018 0634   HDL 31 (L) 06/08/2018 0634   CHOLHDL 4.2 06/08/2018 0634   VLDL 17 06/08/2018 0634   LDLCALC 81 06/08/2018 0634    Other Studies Reviewed Today:  Carotid Dopplers 06/08/2018: IMPRESSION: Color duplex indicates minimal heterogeneous plaque, with no hemodynamically significant stenosis by duplex criteria in the extracranial cerebrovascular  circulation.  Echocardiogram 05/06/2019: 1. Left ventricular ejection fraction, by estimation, is 55 to 60%. The  left ventricle has normal function. The left ventricle has no regional  wall motion abnormalities. There is mild left ventricular hypertrophy.  Left ventricular diastolic parameters  are indeterminate.  2. Right ventricular systolic function is normal. The right ventricular  size is normal.  3. The mitral valve is normal in structure. Trivial mitral valve  regurgitation. No evidence of mitral stenosis.  4. The aortic valve has an indeterminant number of cusps. Aortic valve  regurgitation is not visualized. No aortic stenosis is present.  5. Aortic dilatation noted. There is mild to moderate dilatation of the  aortic root measuring 43 mm.  6. Indeterminant PASP, inadequate TR jet.  7. The inferior vena cava is normal in size with greater than 50%  respiratory variability, suggesting right atrial pressure of 3 mmHg.   Assessment and Plan:  1.  Persistent atrial fibrillation with CHA2DS2-VASc score of 3.  He is asymptomatic in terms of palpitations and has adequate heart rate control on Lopressor.  Plan to manage as permanent atrial fibrillation with heart rate control and anticoagulation.  Continue Eliquis at current dose, recheck CBC and BMET for next visit.  We discussed being observant for any spontaneous bleeding problems.  2.  Regular alcohol intake.  We discussed moderation, cutting back.  Medication Adjustments/Labs and Tests Ordered: Current medicines are reviewed at length with the patient today.  Concerns regarding medicines are outlined above.   Tests Ordered: Orders Placed This Encounter  Procedures  . CBC  . Basic metabolic panel  . EKG 12-Lead    Medication Changes: No orders of the defined types were placed in this encounter.   Disposition:  Follow up 4 months in the [place] office.  Signed, Satira Sark, MD, [facility] 08/20/2019 9:58 AM    [facility] [place], [place], [place] [address] Phone: [phone]; Fax: [phone]

## 2019-08-20 ENCOUNTER — Ambulatory Visit: Payer: Medicare HMO | Admitting: Cardiology

## 2019-08-20 ENCOUNTER — Encounter: Payer: Self-pay | Admitting: Cardiology

## 2019-08-20 VITALS — BP 108/72 | HR 84 | Ht 72.0 in | Wt 314.0 lb

## 2019-08-20 DIAGNOSIS — Z79899 Other long term (current) drug therapy: Secondary | ICD-10-CM

## 2019-08-20 DIAGNOSIS — I4819 Other persistent atrial fibrillation: Secondary | ICD-10-CM

## 2019-08-20 NOTE — Patient Instructions (Addendum)
Medication Instructions:   Your physician recommends that you continue on your current medications as directed. Please refer to the Current Medication list given to you today.  Labwork:  Your physician recommends that you return for non-fasting lab work in: 4 months just before your next visit to check your BMET & CBC. This may be done at [facility] or [facility] Monday-Friday from 8:00 am - 4:00 pm. No appointment is needed.  Testing/Procedures:  NONE  Follow-Up:  Your physician recommends that you schedule a follow-up appointment in: 4 months (office).  Any Other Special Instructions Will Be Listed Below (If Applicable).  If you need a refill on your cardiac medications before your next appointment, please call your pharmacy.

## 2019-09-05 ENCOUNTER — Telehealth: Payer: Self-pay | Admitting: Cardiology

## 2019-09-05 DIAGNOSIS — R230 Cyanosis: Secondary | ICD-10-CM

## 2019-09-05 NOTE — Telephone Encounter (Signed)
Wife [facility]) calling stating that he has noticed that tops of toes on left foot are dark purple.  Does have feeling and movement in those toes.  Are not cold to touch and no soreness with movement either. Just noticed today.  Patient is not diabetic.  Does not remember anything different or any injury to that area.  Has been on the Eliquis about 3 months now.  No other bruising on any other parts of his body.  States that he does also have bad varicose veins.

## 2019-09-05 NOTE — Telephone Encounter (Signed)
Former patient of Bronson Ing that I recently met in the office.  Discoloration of the toes secondary to an embolic phenomenon would be more likely if he were not on Eliquis.  It could potentially be bruising.  I do think that we should evaluate this further, let's get him set up for lower extremity arterial studies ASAP.  If he does begin to develop pain or further discoloration moving up the foot, he should be seen in the ER.

## 2019-09-05 NOTE — Telephone Encounter (Signed)
Wife ([name]) notified.  Order put in & fwd to pcc [facility]) for scheduling.

## 2019-09-05 NOTE — Telephone Encounter (Signed)
Pre-cert Verification for the following procedure     LE arterial doppler bilat    DATE: URGENT - 09/06/2019 [facility]

## 2019-09-05 NOTE — Telephone Encounter (Signed)
New message    Patient wife called she said that her husband is taking Eliquis and his toes have turned black. Please call

## 2019-09-06 ENCOUNTER — Ambulatory Visit (HOSPITAL_COMMUNITY)
Admission: RE | Admit: 2019-09-06 | Discharge: 2019-09-06 | Disposition: A | Discharge status: Home / Self Care | Payer: Medicare HMO | Source: Ambulatory Visit | Attending: Cardiology | Admitting: Cardiology

## 2019-09-06 ENCOUNTER — Ambulatory Visit: Payer: Medicare HMO | Admitting: Family Medicine

## 2019-09-06 ENCOUNTER — Other Ambulatory Visit: Payer: Self-pay

## 2019-09-06 ENCOUNTER — Telehealth: Payer: Self-pay | Admitting: *Deleted

## 2019-09-06 DIAGNOSIS — R230 Cyanosis: Secondary | ICD-10-CM | POA: Insufficient documentation

## 2019-09-06 DIAGNOSIS — L819 Disorder of pigmentation, unspecified: Secondary | ICD-10-CM | POA: Diagnosis not present

## 2019-09-06 NOTE — Telephone Encounter (Signed)
-----   Message from Erma Heritage, [place] sent at 09/06/2019 12:53 PM EDT ----- Patient of Dr. Domenic Polite - Please let the patient know his lower extremity arterial study showed no evidence of significant blockages to account for the discoloration along his toes. If symptoms persist or worsen, would recommend he follow-up with his PCP for further evaluation.

## 2019-09-06 NOTE — Telephone Encounter (Signed)
Laurine Blazer, LPN  06/05/9739 6:38 PM EDT Back to Top    Wife ([name]) notified. Copy to pcp.

## 2019-09-12 ENCOUNTER — Telehealth: Payer: Self-pay | Admitting: Dermatology

## 2019-09-12 NOTE — Telephone Encounter (Signed)
Was here in January and we gave him 2 codes for removal of cyst. Wife gave codes to insurance company and was told it would be covered BUT that we have to send [facility] Medicare the codes for predermination of coverage. Codes were: [phone] for the removal and [phone] for closure. Wants someone to call Simona Huh. CHART [phone]

## 2019-09-16 NOTE — Telephone Encounter (Signed)
Left message for patient to call us back.  

## 2019-09-23 NOTE — Telephone Encounter (Signed)
Left patient a message to call us back.

## 2019-09-24 ENCOUNTER — Ambulatory Visit (HOSPITAL_COMMUNITY): Payer: Medicare HMO | Admitting: Physical Therapy

## 2019-09-27 ENCOUNTER — Other Ambulatory Visit: Payer: Self-pay

## 2019-09-27 ENCOUNTER — Ambulatory Visit (HOSPITAL_COMMUNITY): Payer: Medicare HMO | Attending: Orthopedic Surgery | Admitting: Physical Therapy

## 2019-09-27 DIAGNOSIS — M25562 Pain in left knee: Secondary | ICD-10-CM | POA: Insufficient documentation

## 2019-09-27 DIAGNOSIS — M25662 Stiffness of left knee, not elsewhere classified: Secondary | ICD-10-CM | POA: Insufficient documentation

## 2019-09-27 DIAGNOSIS — G8929 Other chronic pain: Secondary | ICD-10-CM | POA: Diagnosis not present

## 2019-09-27 NOTE — Telephone Encounter (Signed)
Left message for patient to call back this is the third attempt to reach him.

## 2019-09-27 NOTE — Therapy (Signed)
[place], [place], [address] Phone: [phone]   Fax:  [phone]  Physical Therapy Evaluation  Patient Details  Name: Raymond Hurst MRN: [id number] Date of Birth: [birth year] Referring Provider (PT): Gaynelle Arabian, MD   Encounter Date: 09/27/2019   PT End of Session - 09/27/19 0926    Visit Number 1    Number of Visits 8    Date for PT Re-Evaluation 11/08/19    Authorization Type [employer] medicare HMO - auth required, CP [id number]    Progress Note Due on Visit 10    PT Start Time [phone]   pt 10  minutes late to session   PT Stop Time 1000    PT Time Calculation (min) 34 min    Activity Tolerance Patient tolerated treatment well    Behavior During Therapy [facility] for tasks assessed/performed           Past Medical History:  Diagnosis Date  . Atrial fibrillation ([place])   . Cellulitis of right lower leg   . History of TIA (transient ischemic attack)    May 2020  . Lymphedema     Past Surgical History:  Procedure Laterality Date  . INNER EAR SURGERY    . ORTHOPEDIC SURGERY      There were no vitals filed for this visit.    Subjective Assessment - 09/27/19 1006    Subjective States that he is losing cartilage in his left knee with it completely gone on the lateral side. States pain has been going on for a few years. Reports driving and sitting for long periods of time it really bothers him. States that he has to stand up and work it out before it starts working ok. States he takes stairs one at a time and has 15 stairs one at a time and uses railing. Currently reports 3/10 pain in left knee and is achy/tight. Sharp pain is mostly in the morning.  States he was here for PT for his back pain about a year ago. Reports about a month ago had fluid drawn out of knee and has had injection in that knee about a month ago and it helped for about 2 weeks. Reports he has no follow up with MD, but is considering gel injections.     Pertinent History bilateral LE lymphedema, right ankle pain, afib,    Limitations Standing;House hold activities;Walking    Patient Stated Goals to have less pain    Currently in Pain? Yes    Pain Score 3     Pain Location Knee    Pain Orientation Left    Pain Descriptors / Indicators Aching;Tightness    Pain Onset More than a month ago    Pain Frequency Intermittent    Aggravating Factors  getting up from sitting    Pain Relieving Factors movement              [facility] PT Assessment - 09/27/19 0001      Assessment   Medical Diagnosis left knee Osteoarthritis    Referring Provider (PT) Gaynelle Arabian, MD    Next MD Visit none currently    Prior Therapy yes for low back about a year and a half ago      Precautions   Precautions None      Balance Screen   Has the patient fallen in the past 6 months No      [place] residence  Available Help at Discharge Family    Type of Home House    Additional Comments 15 steps with railing at home      Prior Function   Level of Independence Independent    Vocation Retired      Associate Professor   Overall Cognitive Status Within Functional Limits for tasks assessed      Observation/Other Assessments   Observations hips relax into ER bilaterally, limited DF noted (not formally measured on this date)     Focus on Therapeutic Outcomes (FOTO)  50% function      Observation/Other Assessments-Edema    Edema Circumferential      Circumferential Edema   Circumferential - Right 52.0 cm   at knee joint line   Circumferential - Left  54.5cm   joint line     ROM / Strength   AROM / PROM / Strength AROM;Strength      AROM   AROM Assessment Site Knee    Right/Left Knee Right;Left    Right Knee Extension 6   lacking   Right Knee Flexion 124    Left Knee Extension 12   lacking    Left Knee Flexion 116      Strength   Strength Assessment Site Knee    Right/Left Knee Left;Right    Right Knee Flexion 5/5      Right Knee Extension 5/5    Left Knee Flexion 4/5   pain in front of knee   Left Knee Extension 5/5      Palpation   Patella mobility WNL    Palpation comment lateral knee cap pain with palpation  and medial posterior left knee                       Objective measurements completed on examination: See above findings.               PT Education - 09/27/19 1008    Education Details on current presentation, on importance of getting new compression garments and getting pair that includes the knee. - measured patient in clinic and provided with elastic therapy information    Person(s) Educated Patient    Methods Explanation;Handout    Comprehension Verbalized understanding            PT Short Term Goals - 09/27/19 1004      PT SHORT TERM GOAL #1   Title Patient will report at least 25% improvement in overall symptoms and/or function to demonstrate improved functional mobility    Time 2    Period Weeks    Status New    Target Date 10/11/19      PT SHORT TERM GOAL #2   Title Patient will be independent in self management strategies to improve quality of life and functional outcomes.    Time 2    Period Weeks    Status New    Target Date 10/11/19             PT Long Term Goals - 09/27/19 1004      PT LONG TERM GOAL #1   Title Patient will report at least 50% improvement in overall symptoms and/or function to demonstrate improved functional mobility    Time 6    Period Weeks    Status New    Target Date 11/08/19      PT LONG TERM GOAL #2   Title Patient will demonstrate 5-120 degrees of left knee ROM to demomstrate improved knee motion  Time 6    Period Weeks    Status New    Target Date 11/08/19      PT LONG TERM GOAL #3   Title Patient will be able to ascend and descend stairs with reciprocal gait pattern with use of railing as needed to demonstrate improved functional mobility.    Time 6    Period Weeks    Status New    Target  Date 11/08/19      PT LONG TERM GOAL #4   Title Patient will improve on FOTO score to meet predicted outcomes to demonstrate improved functional mobility.    Time 6    Period Weeks    Status New    Target Date 11/08/19                  Plan - 09/27/19 1009    Clinical Impression Statement Patient presents to therapy with chronic left knee pain that is worse after sitting for long periods of time and better once he gets up and moving. Patient also has bilateral lower extremity lymphedema and wears knee high compression garments that are over 48 years old with noticeable rips in garments. Swelling noted in left knee and throughout lower legs. Educated patient in benefits and importance of getting new garments, provided patient with elastic therapy information and measured patient in clinic. Educated patient on how therapy could help with current condition. Patient would greatly benefit from skilled physical therapy to improve functional mobility and overall function.    Personal Factors and Comorbidities Comorbidity 1;Comorbidity 2;Comorbidity 3+    Comorbidities afib,  Bilateral LE lymphedema, right ankle OA    Examination-Activity Limitations Locomotion Level;Sit;Transfers;Stairs    Examination-Participation Restrictions Cleaning;Community Activity    Stability/Clinical Decision Making Stable/Uncomplicated    Clinical Decision Making Low    Rehab Potential Good    PT Frequency Other (comment)   8 visits over 6 week certification at max of 2x/week   PT Duration 6 weeks    PT Treatment/Interventions ADLs/Self Care Home Management;Aquatic Therapy;Cryotherapy;Electrical Stimulation;Moist Heat;Traction;Balance training;Therapeutic exercise;Therapeutic activities;Functional mobility training;Stair training;Gait training;DME Instruction;Neuromuscular re-education;Patient/family education;Manual techniques;Manual lymph drainage;Taping;Vasopneumatic Device;Passive range of motion    PT Next  Visit Plan f/u with compression garments, edema massage, knee ROM and strengthening exercises    PT Home Exercise Plan obtain new compression garments    Consulted and Agree with Plan of Care Patient           Patient will benefit from skilled therapeutic intervention in order to improve the following deficits and impairments:  Decreased endurance, Increased edema, Decreased activity tolerance, Decreased strength, Pain, Decreased knowledge of use of DME, Decreased mobility, Difficulty walking, Decreased balance, Decreased range of motion  Visit Diagnosis: Chronic pain of left knee - Plan: PT plan of care cert/re-cert  Decreased range of motion (ROM) of left knee - Plan: PT plan of care cert/re-cert     Problem List Patient Active Problem List   Diagnosis Date Noted  . Dizziness 06/08/2018  . TIA (transient ischemic attack) 06/08/2018  . Unspecified atrial fibrillation ([place]) 06/08/2018  . Near syncope 06/07/2018  . Hypokalemia 06/07/2018  . Lymphedema of leg 04/08/2013  . Cellulitis of right lower leg 04/04/2013  . Obesity, unspecified 04/04/2013  . Alcohol abuse 04/04/2013  . Cellulitis and abscess of leg 04/04/2013    10:31 AM, 09/27/19 Jerene Pitch, DPT Physical Therapy with [facility]  [phone] office  [facility] [address]  [place], [place], [address] Phone: [phone]   Fax:  [phone]  Name: Raymond Hurst MRN: [id number] Date of Birth: [birth year]

## 2019-09-30 ENCOUNTER — Ambulatory Visit (HOSPITAL_COMMUNITY): Payer: Medicare HMO | Admitting: Physical Therapy

## 2019-09-30 ENCOUNTER — Other Ambulatory Visit: Payer: Self-pay

## 2019-09-30 ENCOUNTER — Encounter (HOSPITAL_COMMUNITY): Payer: Self-pay | Admitting: Physical Therapy

## 2019-09-30 DIAGNOSIS — M25562 Pain in left knee: Secondary | ICD-10-CM

## 2019-09-30 DIAGNOSIS — G8929 Other chronic pain: Secondary | ICD-10-CM

## 2019-09-30 DIAGNOSIS — M25662 Stiffness of left knee, not elsewhere classified: Secondary | ICD-10-CM | POA: Diagnosis not present

## 2019-09-30 NOTE — Therapy (Signed)
[place], [place], [address] Phone: [phone]   Fax:  [phone]  Physical Therapy Treatment  Patient Details  Name: Raymond Hurst MRN: [id number] Date of Birth: 05-15-1950 Referring Provider (PT): Gaynelle Arabian, MD   Encounter Date: 09/30/2019   PT End of Session - 09/30/19 1326    Visit Number 2    Number of Visits 8    Date for PT Re-Evaluation 11/08/19    Authorization Type human[employer] medic[employer] HMO - auth required, CP $40    Progress Note Due on Visit 10    PT Start Time 1328   pt 11 minutes late to session   PT Stop Time 1355    PT Time Calculation (min) 27 min    Activity Tolerance Patient tolerated treatment well    Behavior During Therapy [facility] for tasks assessed/performed           Past Medical History:  Diagnosis Date  . Atrial fibrillation ([place])   . Cellulitis of right lower leg   . History of TIA (transient ischemic attack)    May 2020  . Lymphedema     Past Surgical History:  Procedure Laterality Date  . INNER EAR SURGERY    . ORTHOPEDIC SURGERY      There were no vitals filed for this visit.   Subjective Assessment - 09/30/19 1327    Subjective Feeling pretty god but hasn't used it much. States he is going to get the compression garments tomorrow. States he has an ache in his chest from pushing his lawn mower when it wouldn't crank, reports this has happened to him before. States this happened last thursday.    Pertinent History bilateral LE lymphedema, right ankle pain, afib,    Limitations Standing;House hold activities;Walking    Patient Stated Goals to have less pain    Currently in Pain? Yes    Pain Score 3     Pain Location Knee    Pain Orientation Left    Pain Descriptors / Indicators Aching;Tightness    Pain Onset More than a month ago              [facility] PT Assessment - 09/30/19 0001      Assessment   Medical Diagnosis left knee Osteoarthritis    Referring Provider  (PT) Gaynelle Arabian, MD                         [facility] Adult PT Treatment/Exercise - 09/30/19 0001      Exercises   Exercises Knee/Hip      Knee/Hip Exercises: Stretches   Active Hamstring Stretch Left;3 reps;60 seconds   Seated edge of bed   Quad Stretch Left;3 reps;60 seconds   thomas stretch at edge of bed    Gastroc Stretch Left;3 reps;60 seconds   with towel      Knee/Hip Exercises: Seated   Long Arc Quad AROM;Strengthening;Left;2 sets;10 reps   2" holds      Knee/Hip Exercises: Supine   Bridges 5 reps;AROM;Strengthening;2 sets    Straight Leg Raises Left;AROM;3 sets;10 reps                    PT Short Term Goals - 09/27/19 1004      PT SHORT TERM GOAL #1   Title Patient will report at least 25% improvement in overall symptoms and/or function to demonstrate improved functional mobility    Time 2  Period Weeks    Status New    Target Date 10/11/19      PT SHORT TERM GOAL #2   Title Patient will be independent in self management strategies to improve quality of life and functional outcomes.    Time 2    Period Weeks    Status New    Target Date 10/11/19             PT Long Term Goals - 09/27/19 1004      PT LONG TERM GOAL #1   Title Patient will report at least 50% improvement in overall symptoms and/or function to demonstrate improved functional mobility    Time 6    Period Weeks    Status New    Target Date 11/08/19      PT LONG TERM GOAL #2   Title Patient will demonstrate 5-120 degrees of left knee ROM to demomstrate improved knee motion    Time 6    Period Weeks    Status New    Target Date 11/08/19      PT LONG TERM GOAL #3   Title Patient will be able to ascend and descend stairs with reciprocal gait pattern with use of railing as needed to demonstrate improved functional mobility.    Time 6    Period Weeks    Status New    Target Date 11/08/19      PT LONG TERM GOAL #4   Title Patient will improve on FOTO score to  meet predicted outcomes to demonstrate improved functional mobility.    Time 6    Period Weeks    Status New    Target Date 11/08/19                 Plan - 09/30/19 1326    Clinical Impression Statement Patient reported rib pain with bridges (states he was pushing his mower last week when it wouldn't crank and it has been bothering him since). Patient has tendency to hold breath during exercises, cued patient to breath throughout. Session limited secondary to patient's late arrival. Added new exercises to HEP. Will follow up with obtaining compression garments next session. Will continue with current POC.    Personal Factors and Comorbidities Comorbidity 1;Comorbidity 2;Comorbidity 3+    Comorbidities afib,  Bilateral LE lymphedema, right ankle OA    Examination-Activity Limitations Locomotion Level;Sit;Transfers;Stairs    Examination-Participation Restrictions Cleaning;Community Activity    Stability/Clinical Decision Making Stable/Uncomplicated    Rehab Potential Good    PT Frequency Other (comment)   8 visits over 6 week certification at max of 2x/week   PT Duration 6 weeks    PT Treatment/Interventions ADLs/Self Care Home Management;Aquatic Therapy;Cryotherapy;Electrical Stimulation;Moist Heat;Traction;Balance training;Therapeutic exercise;Therapeutic activities;Functional mobility training;Stair training;Gait training;DME Instruction;Neuromuscular re-education;Patient/family education;Manual techniques;Manual lymph drainage;Taping;Vasopneumatic Device;Passive range of motion    PT Next Visit Plan f/u with compression garments, edema massage, knee ROM and strengthening exercises    PT Home Exercise Plan hamstring stretch, LAQ, SLR, calf stretch    Consulted and Agree with Plan of Care Patient           Patient will benefit from skilled therapeutic intervention in order to improve the following deficits and impairments:  Decreased endurance, Increased edema, Decreased activity  tolerance, Decreased strength, Pain, Decreased knowledge of use of DME, Decreased mobility, Difficulty walking, Decreased balance, Decreased range of motion  Visit Diagnosis: Chronic pain of left knee  Decreased range of motion (ROM) of left knee  Problem List Patient Active Problem List   Diagnosis Date Noted  . Dizziness 06/08/2018  . TIA (transient ischemic attack) 06/08/2018  . Unspecified atrial fibrillation ([place]) 06/08/2018  . Near syncope 06/07/2018  . Hypokalemia 06/07/2018  . Lymphedema of leg 04/08/2013  . Cellulitis of right lower leg 04/04/2013  . Obesity, unspecified 04/04/2013  . Alcohol abuse 04/04/2013  . Cellulitis and abscess of leg 04/04/2013    1:55 PM, 09/30/19 Jerene Pitch, DPT Physical Therapy with [facility]  [phone] office  [place] [address] [place], [place], [address] Phone: [phone]   Fax:  [phone]  Name: Raymond Hurst MRN: [id number] Date of Birth: 03-29-1950

## 2019-10-02 ENCOUNTER — Other Ambulatory Visit: Payer: Self-pay

## 2019-10-02 ENCOUNTER — Ambulatory Visit (HOSPITAL_COMMUNITY): Payer: Medicare HMO | Attending: Orthopedic Surgery | Admitting: Physical Therapy

## 2019-10-02 DIAGNOSIS — G8929 Other chronic pain: Secondary | ICD-10-CM | POA: Insufficient documentation

## 2019-10-02 DIAGNOSIS — M25562 Pain in left knee: Secondary | ICD-10-CM | POA: Diagnosis not present

## 2019-10-02 DIAGNOSIS — M25662 Stiffness of left knee, not elsewhere classified: Secondary | ICD-10-CM | POA: Insufficient documentation

## 2019-10-02 NOTE — Therapy (Signed)
[place], [place], [address] Phone: [phone]   Fax:  [phone]  Physical Therapy Treatment  Patient Details  Name: Raymond Hurst MRN: [id number] Date of Birth: Mar 18, 1950 Referring Provider (PT): Gaynelle Arabian, MD   Encounter Date: 10/02/2019   PT End of Session - 10/02/19 1608    Visit Number 3    Number of Visits 8    Date for PT Re-Evaluation 11/08/19    Authorization Type human[employer] medic[employer] HMO - auth required, CP $40    Progress Note Due on Visit 10    PT Start Time 1533    PT Stop Time 1612    PT Time Calculation (min) 39 min    Activity Tolerance Patient tolerated treatment well    Behavior During Therapy [place] for tasks assessed/performed           Past Medical History:  Diagnosis Date  . Atrial fibrillation ([place])   . Cellulitis of right lower leg   . History of TIA (transient ischemic attack)    May 2020  . Lymphedema     Past Surgical History:  Procedure Laterality Date  . INNER EAR SURGERY    . ORTHOPEDIC SURGERY      There were no vitals filed for this visit.   Subjective Assessment - 10/02/19 1607    Subjective Patient reports he is ok. States that he got his compression garments and is wearing the knee high ones (ted hoes). Current pain is 2/10 and reported as sore when he is walking around    Pertinent History bilateral LE lymphedema, right ankle pain, afib,    Limitations Standing;House hold activities;Walking    Patient Stated Goals to have less pain    Currently in Pain? Yes    Pain Score 2     Pain Location Knee    Pain Orientation Left    Pain Descriptors / Indicators Aching;Sore    Pain Onset More than a month ago              [facility] PT Assessment - 10/02/19 0001      Assessment   Medical Diagnosis left knee Osteoarthritis    Referring Provider (PT) Gaynelle Arabian, MD                         [facility] Adult PT Treatment/Exercise - 10/02/19 0001       Knee/Hip Exercises: Stretches   Quad Stretch Left;3 reps;60 seconds      Knee/Hip Exercises: Standing   Forward Step Up 4 sets;5 reps;Hand Hold: 1;Step Height: 4";Left    Other Standing Knee Exercises TKE at wall with ball x5 5" holds - pain stopped      Knee/Hip Exercises: Seated   Sit to Sand 3 sets;5 reps;without UE support      Manual Therapy   Manual Therapy Joint mobilization;Soft tissue mobilization    Manual therapy comments separate rest of treatment    Joint Mobilization left patella mobilization in all directions - tolerated moderately we.ll     Soft tissue mobilization STM to lateral quads and hamstrings                   PT Education - 10/02/19 1619    Education Details patient educated in different types of stockings and encouraged to wear thigh  high compression garments. Educated patient on possible benefits of lymphedema    Person(s) Educated Patient  Methods Explanation    Comprehension Verbalized understanding            PT Short Term Goals - 09/27/19 1004      PT SHORT TERM GOAL #1   Title Patient will report at least 25% improvement in overall symptoms and/or function to demonstrate improved functional mobility    Time 2    Period Weeks    Status New    Target Date 10/11/19      PT SHORT TERM GOAL #2   Title Patient will be independent in self management strategies to improve quality of life and functional outcomes.    Time 2    Period Weeks    Status New    Target Date 10/11/19             PT Long Term Goals - 09/27/19 1004      PT LONG TERM GOAL #1   Title Patient will report at least 50% improvement in overall symptoms and/or function to demonstrate improved functional mobility    Time 6    Period Weeks    Status New    Target Date 11/08/19      PT LONG TERM GOAL #2   Title Patient will demonstrate 5-120 degrees of left knee ROM to demomstrate improved knee motion    Time 6    Period Weeks    Status New    Target Date  11/08/19      PT LONG TERM GOAL #3   Title Patient will be able to ascend and descend stairs with reciprocal gait pattern with use of railing as needed to demonstrate improved functional mobility.    Time 6    Period Weeks    Status New    Target Date 11/08/19      PT LONG TERM GOAL #4   Title Patient will improve on FOTO score to meet predicted outcomes to demonstrate improved functional mobility.    Time 6    Period Weeks    Status New    Target Date 11/08/19                 Plan - 10/02/19 1608    Clinical Impression Statement Focused on education and TKE on this date. Did not tolerate standing TKE. Initially reported pain with steps but this decreased with repetition. Improved TKE noted after manual work. Swelling noted bilaterally with right more than left. Had lymphedema specialist inspect legs and briefly discussed possible benefits of lymphedema. Will continue to focus on swelling, TKE and strength as tolerated.    Personal Factors and Comorbidities Comorbidity 1;Comorbidity 2;Comorbidity 3+    Comorbidities afib,  Bilateral LE lymphedema, right ankle OA    Examination-Activity Limitations Locomotion Level;Sit;Transfers;Stairs    Examination-Participation Restrictions Cleaning;Community Activity    Stability/Clinical Decision Making Stable/Uncomplicated    Rehab Potential Good    PT Frequency Other (comment)   8 visits over 6 week certification at max of 2x/week   PT Duration 6 weeks    PT Treatment/Interventions ADLs/Self Care Home Management;Aquatic Therapy;Cryotherapy;Electrical Stimulation;Moist Heat;Traction;Balance training;Therapeutic exercise;Therapeutic activities;Functional mobility training;Stair training;Gait training;DME Instruction;Neuromuscular re-education;Patient/family education;Manual techniques;Manual lymph drainage;Taping;Vasopneumatic Device;Passive range of motion    PT Next Visit Plan edema massage, knee ROM and strengthening exercises - patella  mobilizations    PT Home Exercise Plan hamstring stretch, LAQ, SLR, calf stretch    Consulted and Agree with Plan of Care Patient           Patient will benefit from skilled  therapeutic intervention in order to improve the following deficits and impairments:  Decreased endurance, Increased edema, Decreased activity tolerance, Decreased strength, Pain, Decreased knowledge of use of DME, Decreased mobility, Difficulty walking, Decreased balance, Decreased range of motion  Visit Diagnosis: Chronic pain of left knee  Decreased range of motion (ROM) of left knee     Problem List Patient Active Problem List   Diagnosis Date Noted  . Dizziness 06/08/2018  . TIA (transient ischemic attack) 06/08/2018  . Unspecified atrial fibrillation ([place]) 06/08/2018  . Near syncope 06/07/2018  . Hypokalemia 06/07/2018  . Lymphedema of leg 04/08/2013  . Cellulitis of right lower leg 04/04/2013  . Obesity, unspecified 04/04/2013  . Alcohol abuse 04/04/2013  . Cellulitis and abscess of leg 04/04/2013   5:08 PM, 10/02/19 Jerene Pitch, DPT Physical Therapy with [facility]  [phone] office  [place] [address] [place], [place], [address] Phone: [phone]   Fax:  [phone]  Name: EDDISON SEARLS MRN: [id number] Date of Birth: January 15, 1951

## 2019-10-08 ENCOUNTER — Encounter (HOSPITAL_COMMUNITY): Payer: Self-pay | Admitting: Physical Therapy

## 2019-10-08 ENCOUNTER — Ambulatory Visit (HOSPITAL_COMMUNITY): Payer: Medicare HMO | Admitting: Physical Therapy

## 2019-10-08 ENCOUNTER — Other Ambulatory Visit: Payer: Self-pay

## 2019-10-08 DIAGNOSIS — M25662 Stiffness of left knee, not elsewhere classified: Secondary | ICD-10-CM | POA: Diagnosis not present

## 2019-10-08 DIAGNOSIS — M25562 Pain in left knee: Secondary | ICD-10-CM | POA: Diagnosis not present

## 2019-10-08 DIAGNOSIS — G8929 Other chronic pain: Secondary | ICD-10-CM

## 2019-10-08 NOTE — Therapy (Signed)
[place], [place], [address] Phone: [phone]   Fax:  [phone]  Physical Therapy Treatment  Patient Details  Name: Raymond Hurst MRN: [id number] Date of Birth: Jan 14, 1951 Referring Provider (PT): Gaynelle Arabian, MD   Encounter Date: 10/08/2019   PT End of Session - 10/08/19 1449    Visit Number 4    Number of Visits 8    Date for PT Re-Evaluation 11/08/19    Authorization Type humana medicare HMO - auth required, CP $40    Progress Note Due on Visit 10    PT Start Time 1450    PT Stop Time 1530    PT Time Calculation (min) 40 min    Activity Tolerance Patient tolerated treatment well    Behavior During Therapy [facility] for tasks assessed/performed           Past Medical History:  Diagnosis Date   Atrial fibrillation ([place])    Cellulitis of right lower leg    History of TIA (transient ischemic attack)    May 2020   Lymphedema     Past Surgical History:  Procedure Laterality Date   INNER EAR SURGERY     ORTHOPEDIC SURGERY      There were no vitals filed for this visit.   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 States that he has been doing his exercises sporadically. States current pain is 3/10  and is dull and along the lateral side of his.    Pertinent History bilateral LE lymphedema, right ankle pain, afib,    Limitations Standing;House hold activities;Walking    Patient Stated Goals to have less pain    Currently in Pain? Yes    Pain Score 3     Pain Location Knee    Pain Orientation Left    Pain Descriptors / Indicators Aching    Pain Onset More than a month ago              [facility] PT Assessment - 10/08/19 0001      Assessment   Medical Diagnosis left knee Osteoarthritis    Referring Provider (PT) Gaynelle Arabian, MD                          [facility] Adult PT Treatment/Exercise - 10/08/19 0001      Knee/Hip Exercises: Stretches   Quad Stretch Left;3 reps;60 seconds   thomas stretch   Gastroc Stretch Left;3 reps;60 seconds;Right   on step      Knee/Hip Exercises: Seated   Other Seated Knee/Hip Exercises self mobilization to left knee with tiger tail - 2 minutes; self inf patella mobilization 2x10 L    Sit to Sand 5 reps;without UE support   RTB with elevated seat - blue cushion; 4 sets     Knee/Hip Exercises: Supine   Short Arc Quad Sets AROM;Strengthening;Left;3 sets;10 reps   3   Short Arc Quad Sets Limitations 2"" holds, large bolster      Manual Therapy   Manual Therapy Joint mobilization;Soft tissue mobilization    Manual therapy comments separate rest of treatment    Joint Mobilization left patella mobilization in all directions - tolerated moderately well - focus on inferior direction     Soft tissue mobilization STM to lateral  quads and hamstrings                   PT Education - 10/08/19 1503    Education Details on HEP importance    Person(s) Educated Patient    Methods Explanation    Comprehension Verbalized understanding            PT Short Term Goals - 09/27/19 1004      PT SHORT TERM GOAL #1   Title Patient will report at least 25% improvement in overall symptoms and/or function to demonstrate improved functional mobility    Time 2    Period Weeks    Status New    Target Date 10/11/19      PT SHORT TERM GOAL #2   Title Patient will be independent in self management strategies to improve quality of life and functional outcomes.    Time 2    Period Weeks    Status New    Target Date 10/11/19             PT Long Term Goals - 09/27/19 1004      PT LONG TERM GOAL #1   Title Patient will report at least 50% improvement in overall symptoms and/or function to demonstrate improved functional mobility    Time 6    Period Weeks    Status New    Target Date  11/08/19      PT LONG TERM GOAL #2   Title Patient will demonstrate 5-120 degrees of left knee ROM to demomstrate improved knee motion    Time 6    Period Weeks    Status New    Target Date 11/08/19      PT LONG TERM GOAL #3   Title Patient will be able to ascend and descend stairs with reciprocal gait pattern with use of railing as needed to demonstrate improved functional mobility.    Time 6    Period Weeks    Status New    Target Date 11/08/19      PT LONG TERM GOAL #4   Title Patient will improve on FOTO score to meet predicted outcomes to demonstrate improved functional mobility.    Time 6    Period Weeks    Status New    Target Date 11/08/19                 Plan - 10/08/19 1450    Clinical Impression Statement Continued with strengthening and mobility work. Left quad continues to be limited and weak with patient favoring right leg with functional movements. Focused on quad and calf stretching. Improved terminal knee extension noted after manual work and stretched. Educated patient on walking with less passive ER and more active heel toe walk (with still some ER of hip). Patient reported no pain when he walked like this. Less pain noted end of session.    Personal Factors and Comorbidities Comorbidity 1;Comorbidity 2;Comorbidity 3+    Comorbidities afib,  Bilateral LE lymphedema, right ankle OA    Examination-Activity Limitations Locomotion Level;Sit;Transfers;Stairs    Examination-Participation Restrictions Cleaning;Community Activity    Stability/Clinical Decision Making Stable/Uncomplicated    Rehab Potential Good    PT Frequency Other (comment)   8 visits over 6 week certification at max of 2x/week   PT Duration 6 weeks    PT Treatment/Interventions ADLs/Self Care Home Management;Aquatic Therapy;Cryotherapy;Electrical Stimulation;Moist Heat;Traction;Balance training;Therapeutic exercise;Therapeutic activities;Functional mobility training;Stair training;Gait  training;DME Instruction;Neuromuscular re-education;Patient/family education;Manual techniques;Manual lymph drainage;Taping;Vasopneumatic Device;Passive range of motion  PT Next Visit Plan edema massage, knee ROM and strengthening exercises - patella mobilizations    PT Home Exercise Plan hamstring stretch, LAQ, SLR, calf stretch    Consulted and Agree with Plan of Care Patient           Patient will benefit from skilled therapeutic intervention in order to improve the following deficits and impairments:  Decreased endurance, Increased edema, Decreased activity tolerance, Decreased strength, Pain, Decreased knowledge of use of DME, Decreased mobility, Difficulty walking, Decreased balance, Decreased range of motion  Visit Diagnosis: Chronic pain of left knee  Decreased range of motion (ROM) of left knee     Problem List Patient Active Problem List   Diagnosis Date Noted   Dizziness 06/08/2018   TIA (transient ischemic attack) 06/08/2018   Unspecified atrial fibrillation ([place]) 06/08/2018   Near syncope 06/07/2018   Hypokalemia 06/07/2018   Lymphedema of leg 04/08/2013   Cellulitis of right lower leg 04/04/2013   Obesity, unspecified 04/04/2013   Alcohol abuse 04/04/2013   Cellulitis and abscess of leg 04/04/2013   4:03 PM, 10/08/19 Jerene Pitch, DPT Physical Therapy with [facility]  [phone] office  [place] [address] [place], [place], [address] Phone: [phone]   Fax:  [phone]  Name: Raymond Hurst MRN: [id number] Date of Birth: [birth year]

## 2019-10-09 ENCOUNTER — Encounter (HOSPITAL_COMMUNITY): Payer: Medicare HMO

## 2019-10-14 ENCOUNTER — Ambulatory Visit (HOSPITAL_COMMUNITY): Payer: Medicare HMO | Admitting: Physical Therapy

## 2019-10-14 ENCOUNTER — Other Ambulatory Visit: Payer: Self-pay

## 2019-10-14 DIAGNOSIS — G8929 Other chronic pain: Secondary | ICD-10-CM | POA: Diagnosis not present

## 2019-10-14 DIAGNOSIS — M25662 Stiffness of left knee, not elsewhere classified: Secondary | ICD-10-CM | POA: Diagnosis not present

## 2019-10-14 DIAGNOSIS — M25562 Pain in left knee: Secondary | ICD-10-CM

## 2019-10-14 NOTE — Therapy (Signed)
[place], [place], [address] Phone: [phone]   Fax:  [phone]  Physical Therapy Treatment  Patient Details  Name: Raymond Hurst MRN: [id number] Date of Birth: [birth year] Referring Provider (PT): Gaynelle Arabian, MD   Encounter Date: 10/14/2019   PT End of Session - 10/14/19 0959    Visit Number 5    Number of Visits 8    Date for PT Re-Evaluation 11/08/19    Authorization Type human[employer] medicare HMO - auth required, CP $40    Progress Note Due on Visit 10    PT Start Time 0924    PT Stop Time 1002    PT Time Calculation (min) 38 min    Activity Tolerance Patient tolerated treatment well    Behavior During Therapy [facility] for tasks assessed/performed           Past Medical History:  Diagnosis Date  . Atrial fibrillation ([place])   . Cellulitis of right lower leg   . History of TIA (transient ischemic attack)    May 2020  . Lymphedema     Past Surgical History:  Procedure Laterality Date  . INNER EAR SURGERY    . ORTHOPEDIC SURGERY      There were no vitals filed for this visit.   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  Current pain 4/10.    Currently in Pain? Yes    Pain Score 4     Pain Location Knee    Pain Orientation Left    Pain Descriptors / Indicators Aching                             [facility] Adult PT Treatment/Exercise - 10/14/19 0001      Knee/Hip Exercises: Seated   Sit to Sand 5 reps;without UE support   airex, 4 sets of 5 reps     Knee/Hip Exercises: Supine   Short Arc Quad Sets Both;20 reps;Limitations    Short Arc Quad Sets Limitations 5" holds, toes up towards ceiling, VC's for breathing    Bridges 4 sets;5 reps    Bridges Limitations VC/s for breathing    Straight Leg Raises 3 sets;10 reps      Knee/Hip Exercises: Sidelying   Hip  ABduction Left;2 sets;10 reps      Knee/Hip Exercises: Prone   Hip Extension Both;2 sets;10 reps                    PT Short Term Goals - 09/27/19 1004      PT SHORT TERM GOAL #1   Title Patient will report at least 25% improvement in overall symptoms and/or function to demonstrate improved functional mobility    Time 2    Period Weeks    Status New    Target Date 10/11/19      PT SHORT TERM GOAL #2   Title Patient will be independent in self management strategies to improve quality of life and functional outcomes.    Time 2    Period Weeks    Status New    Target Date 10/11/19             PT Long Term Goals - 09/27/19 1004      PT LONG TERM GOAL #1   Title  Patient will report at least 50% improvement in overall symptoms and/or function to demonstrate improved functional mobility    Time 6    Period Weeks    Status New    Target Date 11/08/19      PT LONG TERM GOAL #2   Title Patient will demonstrate 5-120 degrees of left knee ROM to demomstrate improved knee motion    Time 6    Period Weeks    Status New    Target Date 11/08/19      PT LONG TERM GOAL #3   Title Patient will be able to ascend and descend stairs with reciprocal gait pattern with use of railing as needed to demonstrate improved functional mobility.    Time 6    Period Weeks    Status New    Target Date 11/08/19      PT LONG TERM GOAL #4   Title Patient will improve on FOTO score to meet predicted outcomes to demonstrate improved functional mobility.    Time 6    Period Weeks    Status New    Target Date 11/08/19                 Plan - 10/14/19 1000    Clinical Impression Statement Continued with established exercises with extreme SOB completing any activity and tendency to complete activity quickly/holding breath.  Pt required VC's for breathing techniques for inhalation/exhalation with activity.   Focused more on NWB activities this session, strengthening hip and  surrounding musculature for knee support.  Noted challenge with these as weak mm are weak.  Verbal and tactile cues for form and stabilization.  Pt able to complete all without any reports of pain, however did require several rest breaks throughout session.    Personal Factors and Comorbidities Comorbidity 1;Comorbidity 2;Comorbidity 3+    Comorbidities afib,  Bilateral LE lymphedema, right ankle OA    Examination-Activity Limitations Locomotion Level;Sit;Transfers;Stairs    Examination-Participation Restrictions Cleaning;Community Activity    Stability/Clinical Decision Making Stable/Uncomplicated    Rehab Potential Good    PT Frequency Other (comment)   8 visits over 6 week certification at max of 2x/week   PT Duration 6 weeks    PT Treatment/Interventions ADLs/Self Care Home Management;Aquatic Therapy;Cryotherapy;Electrical Stimulation;Moist Heat;Traction;Balance training;Therapeutic exercise;Therapeutic activities;Functional mobility training;Stair training;Gait training;DME Instruction;Neuromuscular re-education;Patient/family education;Manual techniques;Manual lymph drainage;Taping;Vasopneumatic Device;Passive range of motion    PT Next Visit Plan edema massage PRN, Lt LE ROM and strengthening exercises.    PT Home Exercise Plan hamstring stretch, LAQ, SLR, calf stretch    Consulted and Agree with Plan of Care Patient           Patient will benefit from skilled therapeutic intervention in order to improve the following deficits and impairments:  Decreased endurance, Increased edema, Decreased activity tolerance, Decreased strength, Pain, Decreased knowledge of use of DME, Decreased mobility, Difficulty walking, Decreased balance, Decreased range of motion  Visit Diagnosis: Decreased range of motion (ROM) of left knee  Chronic pain of left knee     Problem List Patient Active Problem List   Diagnosis Date Noted  . Dizziness 06/08/2018  . TIA (transient ischemic attack) 06/08/2018   . Unspecified atrial fibrillation ([place]) 06/08/2018  . Near syncope 06/07/2018  . Hypokalemia 06/07/2018  . Lymphedema of leg 04/08/2013  . Cellulitis of right lower leg 04/04/2013  . Obesity, unspecified 04/04/2013  . Alcohol abuse 04/04/2013  . Cellulitis and abscess of leg 04/04/2013   Teena Irani, PTA/CLT [phone]  Teena Irani 10/14/2019, 10:03 AM  [place], [place], [address] Phone: [phone]   Fax:  [phone]  Name: DERION KREITER MRN: [id number] Date of Birth: Jun 18, 1950

## 2019-10-16 ENCOUNTER — Encounter (HOSPITAL_COMMUNITY): Payer: Medicare HMO

## 2019-10-16 ENCOUNTER — Telehealth (HOSPITAL_COMMUNITY): Payer: Self-pay

## 2019-10-16 NOTE — Telephone Encounter (Signed)
No show, called and left message concerning missed apt today.   Reminded next apt date and time with contact information included.    Ihor Austin, LPTA/CLT; Delana Meyer [phone]

## 2019-10-21 ENCOUNTER — Encounter (HOSPITAL_COMMUNITY): Payer: Medicare HMO | Admitting: Physical Therapy

## 2019-10-23 ENCOUNTER — Other Ambulatory Visit: Payer: Self-pay

## 2019-10-23 ENCOUNTER — Encounter (HOSPITAL_COMMUNITY): Payer: Self-pay | Admitting: Physical Therapy

## 2019-10-23 ENCOUNTER — Ambulatory Visit (HOSPITAL_COMMUNITY): Payer: Medicare HMO | Admitting: Physical Therapy

## 2019-10-23 DIAGNOSIS — M25662 Stiffness of left knee, not elsewhere classified: Secondary | ICD-10-CM

## 2019-10-23 DIAGNOSIS — M25562 Pain in left knee: Secondary | ICD-10-CM | POA: Diagnosis not present

## 2019-10-23 DIAGNOSIS — G8929 Other chronic pain: Secondary | ICD-10-CM

## 2019-10-23 NOTE — Therapy (Signed)
[place], [place], [address] Phone: [phone]   Fax:  [phone]  Physical Therapy Treatment  Patient Details  Name: Raymond Hurst MRN: [id number] Date of Birth: 1950-03-01 Referring Provider (PT): Gaynelle Arabian, MD   Encounter Date: 10/23/2019   PT End of Session - 10/23/19 0832    Visit Number 6    Number of Visits 8    Date for PT Re-Evaluation 11/08/19    Authorization Type humana medicare HMO - auth required, CP $40    Progress Note Due on Visit 10    PT Start Time 0832    PT Stop Time 0910    PT Time Calculation (min) 38 min    Activity Tolerance Patient tolerated treatment well    Behavior During Therapy [facility] for tasks assessed/performed           Past Medical History:  Diagnosis Date  . Atrial fibrillation ([place])   . Cellulitis of right lower leg   . History of TIA (transient ischemic attack)    May 2020  . Lymphedema     Past Surgical History:  Procedure Laterality Date  . INNER EAR SURGERY    . ORTHOPEDIC SURGERY      There were no vitals filed for this visit.   Subjective Assessment - 10/23/19 0837    Subjective States that he was been up and doing a lot of things that aggravated his knee, states it is feeling better today at 3/10. States overall he feels about the same.  States hat he has been doing a couple of exercises and tries to do them every day. States he does the SLR, STS, patella mob/stretch.  and hamstring stretch.    Currently in Pain? Yes    Pain Score 3     Pain Location Knee    Pain Orientation Left    Pain Descriptors / Indicators Aching              [facility] PT Assessment - 10/23/19 0001      Assessment   Medical Diagnosis left knee Osteoarthritis    Referring Provider (PT) Gaynelle Arabian, MD      Observation/Other Assessments   Focus on Therapeutic Outcomes (FOTO)  51% function    was 50% function      AROM   Left Knee Extension 8   lacking   Left Knee  Flexion 120                         [facility] Adult PT Treatment/Exercise - 10/23/19 0001      Knee/Hip Exercises: Stretches   Quad Stretch Left;3 reps;60 seconds   thomas stretch edge of bed     Knee/Hip Exercises: Seated   Long Arc Quad AROM;Left;3 sets;10 reps   5" holds, 2# ankle weight     Knee/Hip Exercises: Supine   Short Arc Quad Sets Left;4 sets;5 reps   5" holds                 PT Education - 10/23/19 0910    Education Details on exercises to focus on, on POC and plan to DC next session.    Person(s) Educated Patient    Methods Explanation    Comprehension Verbalized understanding            PT Short Term Goals - 10/23/19 0853      PT SHORT TERM GOAL #1   Title Patient  will report at least 25% improvement in overall symptoms and/or function to demonstrate improved functional mobility    Baseline 50% better    Time 2    Period Weeks    Status Achieved    Target Date 10/11/19      PT SHORT TERM GOAL #2   Title Patient will be independent in self management strategies to improve quality of life and functional outcomes.    Baseline working on    Time 2    Period Weeks    Status On-going    Target Date 10/11/19             PT Long Term Goals - 10/23/19 0853      PT LONG TERM GOAL #1   Title Patient will report at least 50% improvement in overall symptoms and/or function to demonstrate improved functional mobility    Baseline 50% better    Time 6    Period Weeks    Status Achieved      PT LONG TERM GOAL #2   Title Patient will demonstrate 5-120 degrees of left knee ROM to demomstrate improved knee motion    Baseline 8-120    Time 6    Period Weeks    Status On-going      PT LONG TERM GOAL #3   Title Patient will be able to ascend and descend stairs with reciprocal gait pattern with use of railing as needed to demonstrate improved functional mobility.    Baseline able to perform    Time 6    Period Weeks    Status Achieved        PT LONG TERM GOAL #4   Title Patient will improve on FOTO score to meet predicted outcomes to demonstrate improved functional mobility.    Baseline was 50% now 51%    Time 6    Period Weeks    Status On-going                 Plan - 10/23/19 3790    Clinical Impression Statement Overall patient tolerated exercises well today with reports of less pain after quad strengthening and stretching when he got up and walked around end of session. Patient continues to hold breath during exercises and was cued throughout session to breath. Performed FOTO with minimal progress made. Currently patient has met  STG and 2/4 LTG. Discussed focus on HEP and reviewed HEP. Anticipate DC from PT next session pending patient presentation.    Personal Factors and Comorbidities Comorbidity 1;Comorbidity 2;Comorbidity 3+    Comorbidities afib,  Bilateral LE lymphedema, right ankle OA    Examination-Activity Limitations Locomotion Level;Sit;Transfers;Stairs    Examination-Participation Restrictions Cleaning;Community Activity    Stability/Clinical Decision Making Stable/Uncomplicated    Rehab Potential Good    PT Frequency Other (comment)   8 visits over 6 week certification at max of 2x/week   PT Duration 6 weeks    PT Treatment/Interventions ADLs/Self Care Home Management;Aquatic Therapy;Cryotherapy;Electrical Stimulation;Moist Heat;Traction;Balance training;Therapeutic exercise;Therapeutic activities;Functional mobility training;Stair training;Gait training;DME Instruction;Neuromuscular re-education;Patient/family education;Manual techniques;Manual lymph drainage;Taping;Vasopneumatic Device;Passive range of motion    PT Next Visit Plan edema massage PRN, Lt LE ROM and strengthening exercises.    PT Home Exercise Plan hamstring stretch, LAQ, SLR, calf stretch; STS, patella mobilizations; thomas stretch, LAQs    Consulted and Agree with Plan of Care Patient           Patient will benefit from  skilled therapeutic intervention in order to improve the following deficits  and impairments:  Decreased endurance, Increased edema, Decreased activity tolerance, Decreased strength, Pain, Decreased knowledge of use of DME, Decreased mobility, Difficulty walking, Decreased balance, Decreased range of motion  Visit Diagnosis: Decreased range of motion (ROM) of left knee  Chronic pain of left knee     Problem List Patient Active Problem List   Diagnosis Date Noted  . Dizziness 06/08/2018  . TIA (transient ischemic attack) 06/08/2018  . Unspecified atrial fibrillation ([place]) 06/08/2018  . Near syncope 06/07/2018  . Hypokalemia 06/07/2018  . Lymphedema of leg 04/08/2013  . Cellulitis of right lower leg 04/04/2013  . Obesity, unspecified 04/04/2013  . Alcohol abuse 04/04/2013  . Cellulitis and abscess of leg 04/04/2013   9:15 AM, 10/23/19 Jerene Pitch, DPT Physical Therapy with [facility]  [phone] office  [place] [address] [place], [place], [address] Phone: [phone]   Fax:  [phone]  Name: TUAN TIPPIN MRN: [id number] Date of Birth: 09-Jan-1951

## 2019-10-23 NOTE — Patient Instructions (Signed)
Access Code: [email or web address] URL: [email or web address] Date: 10/23/2019 Prepared by: Yetta Glassman  Exercises Supine Quadriceps Stretch with Strap on Table - 1 x daily - 7 x weekly - 1 sets - 3 reps - 60 hold Seated Hamstring Stretch - 1 x daily - 7 x weekly - 1 sets - 3 reps - 30 hold Seated Long Arc Quad - 1 x daily - 7 x weekly - 4 sets - 10 reps - 5 hold 4 Way Patellar Glide - 1 x daily - 7 x weekly - 10 reps - 5 hold Stair Negotiation with Single Rail Using Step-Through Pattern (Foot Over Foot)

## 2019-10-28 ENCOUNTER — Other Ambulatory Visit: Payer: Self-pay | Admitting: *Deleted

## 2019-10-28 MED ORDER — METOPROLOL TARTRATE 25 MG PO TABS: 25.0000 mg | ORAL_TABLET | Freq: Two times a day (BID) | ORAL | 6 refills | Status: DC

## 2019-10-30 ENCOUNTER — Ambulatory Visit (HOSPITAL_COMMUNITY): Payer: Medicare HMO | Admitting: Physical Therapy

## 2019-10-31 ENCOUNTER — Other Ambulatory Visit: Payer: Self-pay

## 2019-10-31 ENCOUNTER — Encounter (HOSPITAL_COMMUNITY): Payer: Self-pay | Admitting: Physical Therapy

## 2019-10-31 ENCOUNTER — Ambulatory Visit (HOSPITAL_COMMUNITY): Payer: Medicare HMO | Admitting: Physical Therapy

## 2019-10-31 DIAGNOSIS — G8929 Other chronic pain: Secondary | ICD-10-CM | POA: Diagnosis not present

## 2019-10-31 DIAGNOSIS — M25562 Pain in left knee: Secondary | ICD-10-CM

## 2019-10-31 DIAGNOSIS — M25662 Stiffness of left knee, not elsewhere classified: Secondary | ICD-10-CM

## 2019-10-31 NOTE — Therapy (Signed)
[place], [place], [address] Phone: [phone]   Fax:  [phone]  Physical Therapy Treatment, progress Note and RECERT   Patient Details  Name: Raymond Hurst MRN: [id number] Date of Birth: 1951-01-30 Referring Provider (PT): Gaynelle Arabian, MD  Progress Note Reporting Period 09/27/19 to 10/31/19  See note below for Objective Data and Assessment of Progress/Goals.       Encounter Date: 10/31/2019   PT End of Session - 10/31/19 1004    Visit Number 7    Number of Visits 13    Date for PT Re-Evaluation 12/05/19    Authorization Type human[employer] medicare HMO - auth required, CP $40    Progress Note Due on Visit 17    PT Start Time 1003    PT Stop Time 1041    PT Time Calculation (min) 38 min    Activity Tolerance Patient tolerated treatment well    Behavior During Therapy [facility] for tasks assessed/performed           Past Medical History:  Diagnosis Date  . Atrial fibrillation ([place])   . Cellulitis of right lower leg   . History of TIA (transient ischemic attack)    May 2020  . Lymphedema     Past Surgical History:  Procedure Laterality Date  . INNER EAR SURGERY    . ORTHOPEDIC SURGERY      There were no vitals filed for this visit.   Subjective Assessment - 10/31/19 1014    Subjective States last Sunday he was having a real sharp pain. 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 States that he noticed a little improvement overall. Reports overall he feels about 60% better.              [facility] PT Assessment - 10/31/19 0001      Assessment   Medical Diagnosis left knee Osteoarthritis    Referring Provider (PT) Gaynelle Arabian, MD      Observation/Other Assessments   Focus on Therapeutic Outcomes (FOTO)  51% function    was 50% function      AROM   Left Knee Extension 6   lacking,  was lacking 12   Left Knee Flexion 121   was 116     Strength   Right/Left Knee  Right;Left    Right Knee Flexion 5/5    Right Knee Extension 5/5    Left Knee Flexion 4+/5   pain front of knee   Left Knee Extension 5/5                         [facility] Adult PT Treatment/Exercise - 10/31/19 0001      Knee/Hip Exercises: Stretches   Other Knee/Hip Stretches heel slides with end range holds of 15" L 2x5      Knee/Hip Exercises: Standing   Hip Abduction Left;Both;Stengthening;3 sets;10 reps;Knee straight   2# ankle weights    Hip Extension Right;Left;Stengthening;3 sets;10 reps;Knee straight   2# ankle weight   Functional Squat 4 sets;5 reps   counter support, ball for hip add iso and cue feet straight   Other Standing Knee Exercises hamstring curl 4x5 B 2# ankle weights - counter support      Knee/Hip Exercises: Supine   Bridges 4 sets;5 reps   cramping in legs noted - required rest breakss cues tobreath   Knee Extension AROM;Left    Knee Extension  Limitations 6   lacking   Knee Flexion AROM;Left    Knee Flexion Limitations 121                    PT Short Term Goals - 10/31/19 1011      PT SHORT TERM GOAL #1   Title Patient will report at least 25% improvement in overall symptoms and/or function to demonstrate improved functional mobility    Baseline 60% better    Time 2    Period Weeks    Status Achieved    Target Date 10/11/19      PT SHORT TERM GOAL #2   Title Patient will be independent in self management strategies to improve quality of life and functional outcomes.    Baseline working on    Time 2    Period Weeks    Status On-going    Target Date 10/11/19             PT Long Term Goals - 10/31/19 1013      PT LONG TERM GOAL #1   Title Patient will report at least 50% improvement in overall symptoms and/or function to demonstrate improved functional mobility    Baseline 60% better    Time 6    Period Weeks    Status Achieved      PT LONG TERM GOAL #2   Title Patient will demonstrate 5-120 degrees of left knee ROM  to demomstrate improved knee motion    Baseline 8-120    Time 6    Period Weeks    Status On-going      PT LONG TERM GOAL #3   Title Patient will be able to ascend and descend stairs with reciprocal gait pattern with use of railing as needed to demonstrate improved functional mobility.    Baseline able to perform    Time 6    Period Weeks    Status Achieved      PT LONG TERM GOAL #4   Title Patient will improve on FOTO score to meet predicted outcomes to demonstrate improved functional mobility.    Baseline was 50% now 51%    Time 6    Period Weeks    Status On-going                 Plan - 10/31/19 1031    Clinical Impression Statement Cramping noted during bridges at first. Reassured patietn that dysfunctional muscles may cramp initially with new movement, cramping was not reported in sequential sets. Fatigue with bridge exercise noted. Patient desires to continue with PT for a couple more weeks to continue to work towards goals. Overall patient has met 1 of 2 short term goals and 2 of 4 long term goals at this time. Extending POC for additional 5 weeks at 1x/week with continued focus on strengthening and ROM.    Personal Factors and Comorbidities Comorbidity 1;Comorbidity 2;Comorbidity 3+    Comorbidities afib,  Bilateral LE lymphedema, right ankle OA    Examination-Activity Limitations Locomotion Level;Sit;Transfers;Stairs    Examination-Participation Restrictions Cleaning;Community Activity    Stability/Clinical Decision Making Stable/Uncomplicated    Rehab Potential Good    PT Frequency 1x / week    PT Duration Other (comment)   5 weeks   PT Treatment/Interventions ADLs/Self Care Home Management;Aquatic Therapy;Cryotherapy;Electrical Stimulation;Moist Heat;Traction;Balance training;Therapeutic exercise;Therapeutic activities;Functional mobility training;Stair training;Gait training;DME Instruction;Neuromuscular re-education;Patient/family education;Manual techniques;Manual  lymph drainage;Taping;Vasopneumatic Device;Passive range of motion    PT Next Visit Plan edema massage PRN, Lt  LE ROM and strengthening exercises.    PT Home Exercise Plan hamstring stretch, LAQ, SLR, calf stretch; STS, patella mobilizations; thomas stretch, LAQs    Consulted and Agree with Plan of Care Patient           Patient will benefit from skilled therapeutic intervention in order to improve the following deficits and impairments:  Decreased endurance, Increased edema, Decreased activity tolerance, Decreased strength, Pain, Decreased knowledge of use of DME, Decreased mobility, Difficulty walking, Decreased balance, Decreased range of motion  Visit Diagnosis: Decreased range of motion (ROM) of left knee  Chronic pain of left knee     Problem List Patient Active Problem List   Diagnosis Date Noted  . Dizziness 06/08/2018  . TIA (transient ischemic attack) 06/08/2018  . Unspecified atrial fibrillation ([place]) 06/08/2018  . Near syncope 06/07/2018  . Hypokalemia 06/07/2018  . Lymphedema of leg 04/08/2013  . Cellulitis of right lower leg 04/04/2013  . Obesity, unspecified 04/04/2013  . Alcohol abuse 04/04/2013  . Cellulitis and abscess of leg 04/04/2013    10:47 AM, 10/31/19 Jerene Pitch, DPT Physical Therapy with [facility]  [phone] office  [place] [address] [place], [place], [address] Phone: [phone]   Fax:  [phone]  Name: KOAL ESLINGER MRN: [id number] Date of Birth: 08/18/1950

## 2019-11-06 ENCOUNTER — Encounter (HOSPITAL_COMMUNITY): Payer: Self-pay

## 2019-11-06 ENCOUNTER — Other Ambulatory Visit: Payer: Self-pay

## 2019-11-06 ENCOUNTER — Ambulatory Visit (HOSPITAL_COMMUNITY): Payer: Medicare HMO | Attending: Orthopedic Surgery

## 2019-11-06 DIAGNOSIS — M25662 Stiffness of left knee, not elsewhere classified: Secondary | ICD-10-CM | POA: Diagnosis not present

## 2019-11-06 DIAGNOSIS — M25562 Pain in left knee: Secondary | ICD-10-CM | POA: Diagnosis not present

## 2019-11-06 DIAGNOSIS — G8929 Other chronic pain: Secondary | ICD-10-CM | POA: Insufficient documentation

## 2019-11-06 NOTE — Therapy (Signed)
[place], [place], [address] Phone: [phone]   Fax:  [phone]  Physical Therapy Treatment  Patient Details  Name: Raymond Hurst MRN: [id number] Date of Birth: [birth year] Referring Provider (PT): Gaynelle Arabian, MD   Encounter Date: 11/06/2019   PT End of Session - 11/06/19 1551    Visit Number 8    Number of Visits 13    Date for PT Re-Evaluation 12/05/19    Authorization Type human[employer] medic[employer] HMO - auth required, CP $40    Progress Note Due on Visit 17    PT Start Time 1536    PT Stop Time 1615    PT Time Calculation (min) 39 min    Activity Tolerance Patient tolerated treatment well    Behavior During Therapy [facility] for tasks assessed/performed           Past Medical History:  Diagnosis Date  . Atrial fibrillation ([place])   . Cellulitis of right lower leg   . History of TIA (transient ischemic attack)    May 2020  . Lymphedema     Past Surgical History:  Procedure Laterality Date  . INNER EAR SURGERY    . ORTHOPEDIC SURGERY      There were no vitals filed for this visit.   Subjective Assessment - 11/06/19 1540    Subjective Knee is feeling okay today, pain scale 3/10 Lt knee in the joint.    Pertinent History bilateral LE lymphedema, right ankle pain, afib,    Patient Stated Goals to have less pain    Currently in Pain? Yes    Pain Score 3     Pain Location Knee    Pain Orientation Left    Pain Descriptors / Indicators Aching    Pain Onset More than a month ago    Pain Frequency Intermittent    Aggravating Factors  getting up from sitting                             [facility] Adult PT Treatment/Exercise - 11/06/19 0001      Knee/Hip Exercises: Stretches   Gastroc Stretch 2 reps;30 seconds    Gastroc Stretch Limitations slant board      Knee/Hip Exercises: Standing   Knee Flexion Left;10 reps    Knee Flexion Limitations anterior knee pain    Hip Abduction  Both;Stengthening;Knee straight;15 reps    Abduction Limitations 3#, cueing to reduce ER and posture    Hip Extension Both;15 reps;Stengthening    Extension Limitations 3#, cueing to reduce ER and posture    Functional Squat 4 sets;5 reps    Functional Squat Limitations cueing for mechanics, posture and to reduce ER      Knee/Hip Exercises: Seated   Long Arc Quad AROM;Left;3 sets;10 reps    Long Arc Quad Weight 3 lbs.    Long [employer] Limitations 5" holds      Knee/Hip Exercises: Supine   Bridges 2 sets;10 reps    Knee Extension AROM;Left    Knee Extension Limitations 6    Knee Flexion AROM;Left    Knee Flexion Limitations 121                    PT Short Term Goals - 10/31/19 1011      PT SHORT TERM GOAL #1   Title Patient will report at least 25% improvement in overall symptoms and/or function to  demonstrate improved functional mobility    Baseline 60% better    Time 2    Period Weeks    Status Achieved    Target Date 10/11/19      PT SHORT TERM GOAL #2   Title Patient will be independent in self management strategies to improve quality of life and functional outcomes.    Baseline working on    Time 2    Period Weeks    Status On-going    Target Date 10/11/19             PT Long Term Goals - 10/31/19 1013      PT LONG TERM GOAL #1   Title Patient will report at least 50% improvement in overall symptoms and/or function to demonstrate improved functional mobility    Baseline 60% better    Time 6    Period Weeks    Status Achieved      PT LONG TERM GOAL #2   Title Patient will demonstrate 5-120 degrees of left knee ROM to demomstrate improved knee motion    Baseline 8-120    Time 6    Period Weeks    Status On-going      PT LONG TERM GOAL #3   Title Patient will be able to ascend and descend stairs with reciprocal gait pattern with use of railing as needed to demonstrate improved functional mobility.    Baseline able to perform    Time 6     Period Weeks    Status Achieved      PT LONG TERM GOAL #4   Title Patient will improve on FOTO score to meet predicted outcomes to demonstrate improved functional mobility.    Baseline was 50% now 51%    Time 6    Period Weeks    Status On-going                 Plan - 11/06/19 1610    Clinical Impression Statement Continued session focus with hip strengthening and knee mobility.  Added standing TKE to address extension lag and continued with hip strengthening exercise.  Verbal and tactile cueing to improve posture with standing exerises.  Pt continues to fatigue quickly with hip strenghtening exercises, does continue to complain of cramping during bridges. No report of increased pain through sessoin.    Personal Factors and Comorbidities Comorbidity 1;Comorbidity 2;Comorbidity 3+    Comorbidities afib,  Bilateral LE lymphedema, right ankle OA    Examination-Activity Limitations Locomotion Level;Sit;Transfers;Stairs    Examination-Participation Restrictions Cleaning;Community Activity    Stability/Clinical Decision Making Stable/Uncomplicated    Clinical Decision Making Low    Rehab Potential Good    PT Frequency 1x / week    PT Duration --   5 weeks   PT Treatment/Interventions ADLs/Self Care Home Management;Aquatic Therapy;Cryotherapy;Electrical Stimulation;Moist Heat;Traction;Balance training;Therapeutic exercise;Therapeutic activities;Functional mobility training;Stair training;Gait training;DME Instruction;Neuromuscular re-education;Patient/family education;Manual techniques;Manual lymph drainage;Taping;Vasopneumatic Device;Passive range of motion    PT Next Visit Plan edema massage PRN, Lt LE ROM and strengthening exercises.    PT Home Exercise Plan hamstring stretch, LAQ, SLR, calf stretch; STS, patella mobilizations; thomas stretch, LAQs           Patient will benefit from skilled therapeutic intervention in order to improve the following deficits and impairments:   Decreased endurance, Increased edema, Decreased activity tolerance, Decreased strength, Pain, Decreased knowledge of use of DME, Decreased mobility, Difficulty walking, Decreased balance, Decreased range of motion  Visit Diagnosis: Decreased range of motion (ROM)  of left knee  Chronic pain of left knee     Problem List Patient Active Problem List   Diagnosis Date Noted  . Dizziness 06/08/2018  . TIA (transient ischemic attack) 06/08/2018  . Unspecified atrial fibrillation ([place]) 06/08/2018  . Near syncope 06/07/2018  . Hypokalemia 06/07/2018  . Lymphedema of leg 04/08/2013  . Cellulitis of right lower leg 04/04/2013  . Obesity, unspecified 04/04/2013  . Alcohol abuse 04/04/2013  . Cellulitis and abscess of leg 04/04/2013   Ihor Austin, LPTA/CLT; CBIS [phone]  Aldona Lento 11/06/2019, 5:11 PM  [place] [address] [place], [place], [address] Phone: [phone]   Fax:  [phone]  Name: Raymond Hurst MRN: [id number] Date of Birth: 07-29-1950

## 2019-11-14 ENCOUNTER — Other Ambulatory Visit: Payer: Self-pay

## 2019-11-14 ENCOUNTER — Encounter (HOSPITAL_COMMUNITY): Payer: Self-pay

## 2019-11-14 ENCOUNTER — Ambulatory Visit (HOSPITAL_COMMUNITY): Payer: Medicare HMO

## 2019-11-14 DIAGNOSIS — M25662 Stiffness of left knee, not elsewhere classified: Secondary | ICD-10-CM

## 2019-11-14 DIAGNOSIS — M25562 Pain in left knee: Secondary | ICD-10-CM | POA: Diagnosis not present

## 2019-11-14 DIAGNOSIS — G8929 Other chronic pain: Secondary | ICD-10-CM

## 2019-11-14 NOTE — Therapy (Signed)
[place], [place], [address] Phone: [phone]   Fax:  [phone]  Physical Therapy Treatment  Patient Details  Name: Raymond Hurst MRN: [id number] Date of Birth: 1950/09/09 Referring Provider (PT): Gaynelle Arabian, MD   Encounter Date: 11/14/2019   PT End of Session - 11/14/19 1459    Visit Number 9    Number of Visits 13    Date for PT Re-Evaluation 12/05/19    Authorization Type humana medicare HMO - auth required, CP $40    Progress Note Due on Visit 17    PT Start Time 1451    PT Stop Time 1530    PT Time Calculation (min) 39 min    Activity Tolerance Patient tolerated treatment well    Behavior During Therapy [facility] for tasks assessed/performed           Past Medical History:  Diagnosis Date  . Atrial fibrillation ([place])   . Cellulitis of right lower leg   . History of TIA (transient ischemic attack)    May 2020  . Lymphedema     Past Surgical History:  Procedure Laterality Date  . INNER EAR SURGERY    . ORTHOPEDIC SURGERY      There were no vitals filed for this visit.   Subjective Assessment - 11/14/19 1454    Subjective Knee pain in the joint wiht bending, pain scale 3/10 today.    Pertinent History bilateral LE lymphedema, right ankle pain, afib,    Patient Stated Goals to have less pain    Currently in Pain? Yes    Pain Score 3     Pain Location Knee    Pain Orientation Left    Pain Descriptors / Indicators Aching    Pain Type Chronic pain    Pain Onset More than a month ago    Pain Frequency Intermittent    Aggravating Factors  getting up from sitting    Pain Relieving Factors movement                             [facility] Adult PT Treatment/Exercise - 11/14/19 0001      Knee/Hip Exercises: Standing   Heel Raises 10 reps;3 seconds    Heel Raises Limitations incline slope toe and heel raises    Terminal Knee Extension Left;10 reps;Theraband    Terminal Knee  Extension Limitations purple 5" holds    Hip Abduction Both;Stengthening;Knee straight;15 reps    Abduction Limitations 3#, cueing to reduce ER and posture    Hip Extension Both;15 reps;Stengthening    Extension Limitations 3#, cueing to reduce ER and posture    Lateral Step Up Left;10 reps;Hand Hold: 1;Step Height: 4"    Forward Step Up Left;10 reps;Hand Hold: 1;Step Height: 6"    Step Down 10 reps;Hand Hold: 1;Step Height: 4"    Functional Squat 4 sets;5 reps    Functional Squat Limitations cueing for mechanics, posture and to reduce ER    Other Standing Knee Exercises sidestep RTB around thigh 2RT      Knee/Hip Exercises: Seated   Sit to Sand 10 reps;without UE support   difficult eccentric control     Knee/Hip Exercises: Supine   Knee Extension AROM;Left    Knee Extension Limitations 5    Knee Flexion AROM;Left    Knee Flexion Limitations 121  PT Short Term Goals - 10/31/19 1011      PT SHORT TERM GOAL #1   Title Patient will report at least 25% improvement in overall symptoms and/or function to demonstrate improved functional mobility    Baseline 60% better    Time 2    Period Weeks    Status Achieved    Target Date 10/11/19      PT SHORT TERM GOAL #2   Title Patient will be independent in self management strategies to improve quality of life and functional outcomes.    Baseline working on    Time 2    Period Weeks    Status On-going    Target Date 10/11/19             PT Long Term Goals - 10/31/19 1013      PT LONG TERM GOAL #1   Title Patient will report at least 50% improvement in overall symptoms and/or function to demonstrate improved functional mobility    Baseline 60% better    Time 6    Period Weeks    Status Achieved      PT LONG TERM GOAL #2   Title Patient will demonstrate 5-120 degrees of left knee ROM to demomstrate improved knee motion    Baseline 8-120    Time 6    Period Weeks    Status On-going      PT  LONG TERM GOAL #3   Title Patient will be able to ascend and descend stairs with reciprocal gait pattern with use of railing as needed to demonstrate improved functional mobility.    Baseline able to perform    Time 6    Period Weeks    Status Achieved      PT LONG TERM GOAL #4   Title Patient will improve on FOTO score to meet predicted outcomes to demonstrate improved functional mobility.    Baseline was 50% now 51%    Time 6    Period Weeks    Status On-going                 Plan - 11/14/19 1518    Clinical Impression Statement Continue session focus wiht hip strengthening and knee mobility.  Added stair step up for quad strengthening to improve extension.  Pt continues to fatigue quickly with hip strengthening exercises, cueing to reduce ER during abduction exercises.  No reports of increased pain through session, was limited by fatigue with activiites.  AROM 5-121 degrees.    Personal Factors and Comorbidities Comorbidity 1;Comorbidity 2;Comorbidity 3+    Comorbidities afib,  Bilateral LE lymphedema, right ankle OA    Examination-Activity Limitations Locomotion Level;Sit;Transfers;Stairs    Examination-Participation Restrictions Cleaning;Community Activity    Stability/Clinical Decision Making Stable/Uncomplicated    Clinical Decision Making Low    Rehab Potential Good    PT Frequency 1x / week    PT Duration --   5 weeks   PT Treatment/Interventions ADLs/Self Care Home Management;Aquatic Therapy;Cryotherapy;Electrical Stimulation;Moist Heat;Traction;Balance training;Therapeutic exercise;Therapeutic activities;Functional mobility training;Stair training;Gait training;DME Instruction;Neuromuscular re-education;Patient/family education;Manual techniques;Manual lymph drainage;Taping;Vasopneumatic Device;Passive range of motion    PT Next Visit Plan edema massage PRN, Lt LE ROM and strengthening exercises.    PT Home Exercise Plan hamstring stretch, LAQ, SLR, calf stretch; STS,  patella mobilizations; thomas stretch, LAQs           Patient will benefit from skilled therapeutic intervention in order to improve the following deficits and impairments:  Decreased endurance, Increased edema, Decreased  activity tolerance, Decreased strength, Pain, Decreased knowledge of use of DME, Decreased mobility, Difficulty walking, Decreased balance, Decreased range of motion  Visit Diagnosis: Decreased range of motion (ROM) of left knee  Chronic pain of left knee     Problem List Patient Active Problem List   Diagnosis Date Noted  . Dizziness 06/08/2018  . TIA (transient ischemic attack) 06/08/2018  . Unspecified atrial fibrillation ([place]) 06/08/2018  . Near syncope 06/07/2018  . Hypokalemia 06/07/2018  . Lymphedema of leg 04/08/2013  . Cellulitis of right lower leg 04/04/2013  . Obesity, unspecified 04/04/2013  . Alcohol abuse 04/04/2013  . Cellulitis and abscess of leg 04/04/2013   Ihor Austin, LPTA/CLT; CBIS [phone]  Aldona Lento 11/14/2019, 5:33 PM  [place] [address] [place], [place], [address] Phone: [phone]   Fax:  [phone]  Name: Raymond Hurst MRN: [id number] Date of Birth: 1950-02-14

## 2019-11-21 ENCOUNTER — Ambulatory Visit (HOSPITAL_COMMUNITY): Payer: Medicare HMO | Admitting: Physical Therapy

## 2019-11-21 ENCOUNTER — Encounter (HOSPITAL_COMMUNITY): Payer: Self-pay | Admitting: Physical Therapy

## 2019-11-21 ENCOUNTER — Other Ambulatory Visit: Payer: Self-pay

## 2019-11-21 DIAGNOSIS — M25662 Stiffness of left knee, not elsewhere classified: Secondary | ICD-10-CM | POA: Diagnosis not present

## 2019-11-21 DIAGNOSIS — M25562 Pain in left knee: Secondary | ICD-10-CM | POA: Diagnosis not present

## 2019-11-21 DIAGNOSIS — G8929 Other chronic pain: Secondary | ICD-10-CM

## 2019-11-21 NOTE — Therapy (Signed)
[place], [place], [address] Phone: [phone]   Fax:  [phone]  Physical Therapy Treatment  Patient Details  Name: BRAYDAN MARRIOTT MRN: [id number] Date of Birth: [birth year] Referring Provider (PT): Gaynelle Arabian, MD   Encounter Date: 11/21/2019   PT End of Session - 11/21/19 1319    Visit Number 10    Number of Visits 13    Date for PT Re-Evaluation 12/05/19    Authorization Type humana medicare HMO - auth required, CP $40    Progress Note Due on Visit 17    PT Start Time 1320    PT Stop Time 1358    PT Time Calculation (min) 38 min    Activity Tolerance Patient tolerated treatment well    Behavior During Therapy [facility] for tasks assessed/performed           Past Medical History:  Diagnosis Date  . Atrial fibrillation ([place])   . Cellulitis of right lower leg   . History of TIA (transient ischemic attack)    May 2020  . Lymphedema     Past Surgical History:  Procedure Laterality Date  . INNER EAR SURGERY    . ORTHOPEDIC SURGERY      There were no vitals filed for this visit.   Subjective Assessment - 11/21/19 1323    Subjective States he has been feeling good today. Reports he has not done his exercises like he should but he has been pretty active with cleaning the house and with his part time job.    Pertinent History bilateral LE lymphedema, right ankle pain, afib,    Patient Stated Goals to have less pain    Currently in Pain? Yes    Pain Score 2     Pain Location Knee    Pain Orientation Left    Pain Descriptors / Indicators Aching    Pain Onset More than a month ago              [facility] PT Assessment - 11/21/19 0001      Assessment   Medical Diagnosis left knee Osteoarthritis    Referring Provider (PT) Gaynelle Arabian, MD                         [facility] Adult PT Treatment/Exercise - 11/21/19 0001      Ambulation/Gait   Stairs Yes    Stairs Assistance 6: Modified  independent (Device/Increase time)    Stair Management Technique One rail Right;Alternating pattern;Forwards    Number of Stairs 7    Height of Stairs 4    Gait Comments 6 laps      Knee/Hip Exercises: Standing   Lateral Step Up Both;3 sets;10 reps;Hand Hold: 2;Step Height: 4"    Other Standing Knee Exercises quad stretch 3x5 10" holds step posteriorly 4" height and 2 hand assist - Bilaterally performed ; crossover step ups with bilateral upper extremity assist 3x10 B     Other Standing Knee Exercises TKE at wall with orange ball  x10 5" holds L      Knee/Hip Exercises: Seated   Sit to Sand 3 sets;10 reps;without UE support                    PT Short Term Goals - 10/31/19 1011      PT SHORT TERM GOAL #1   Title Patient will report at least 25% improvement in overall symptoms and/or  function to demonstrate improved functional mobility    Baseline 60% better    Time 2    Period Weeks    Status Achieved    Target Date 10/11/19      PT SHORT TERM GOAL #2   Title Patient will be independent in self management strategies to improve quality of life and functional outcomes.    Baseline working on    Time 2    Period Weeks    Status On-going    Target Date 10/11/19             PT Long Term Goals - 10/31/19 1013      PT LONG TERM GOAL #1   Title Patient will report at least 50% improvement in overall symptoms and/or function to demonstrate improved functional mobility    Baseline 60% better    Time 6    Period Weeks    Status Achieved      PT LONG TERM GOAL #2   Title Patient will demonstrate 5-120 degrees of left knee ROM to demomstrate improved knee motion    Baseline 8-120    Time 6    Period Weeks    Status On-going      PT LONG TERM GOAL #3   Title Patient will be able to ascend and descend stairs with reciprocal gait pattern with use of railing as needed to demonstrate improved functional mobility.    Baseline able to perform    Time 6    Period Weeks     Status Achieved      PT LONG TERM GOAL #4   Title Patient will improve on FOTO score to meet predicted outcomes to demonstrate improved functional mobility.    Baseline was 50% now 51%    Time 6    Period Weeks    Status On-going                 Plan - 11/21/19 1320    Clinical Impression Statement Overall tolerance to exercise significantly improved. Fatigue noted with exercises but no increase in pain. Next session reassessment, will review HEP and anticipate discharge pending patient presentation.    Personal Factors and Comorbidities Comorbidity 1;Comorbidity 2;Comorbidity 3+    Comorbidities afib,  Bilateral LE lymphedema, right ankle OA    Examination-Activity Limitations Locomotion Level;Sit;Transfers;Stairs    Examination-Participation Restrictions Cleaning;Community Activity    Stability/Clinical Decision Making Stable/Uncomplicated    Rehab Potential Good    PT Frequency 1x / week    PT Duration --   5 weeks   PT Treatment/Interventions ADLs/Self Care Home Management;Aquatic Therapy;Cryotherapy;Electrical Stimulation;Moist Heat;Traction;Balance training;Therapeutic exercise;Therapeutic activities;Functional mobility training;Stair training;Gait training;DME Instruction;Neuromuscular re-education;Patient/family education;Manual techniques;Manual lymph drainage;Taping;Vasopneumatic Device;Passive range of motion    PT Next Visit Plan PN next session anticipate DC    PT Home Exercise Plan hamstring stretch, LAQ, SLR, calf stretch; STS, patella mobilizations; thomas stretch, LAQs           Patient will benefit from skilled therapeutic intervention in order to improve the following deficits and impairments:  Decreased endurance, Increased edema, Decreased activity tolerance, Decreased strength, Pain, Decreased knowledge of use of DME, Decreased mobility, Difficulty walking, Decreased balance, Decreased range of motion  Visit Diagnosis: Decreased range of motion (ROM)  of left knee  Chronic pain of left knee     Problem List Patient Active Problem List   Diagnosis Date Noted  . Dizziness 06/08/2018  . TIA (transient ischemic attack) 06/08/2018  . Unspecified atrial fibrillation ([place])  06/08/2018  . Near syncope 06/07/2018  . Hypokalemia 06/07/2018  . Lymphedema of leg 04/08/2013  . Cellulitis of right lower leg 04/04/2013  . Obesity, unspecified 04/04/2013  . Alcohol abuse 04/04/2013  . Cellulitis and abscess of leg 04/04/2013    1:58 PM, 11/21/19 Jerene Pitch, DPT Physical Therapy with [facility]  [phone] office  [place] [address] [place], [place], [address] Phone: [phone]   Fax:  [phone]  Name: CAMDAN BURDI MRN: [id number] Date of Birth: [birth year]

## 2019-11-27 ENCOUNTER — Ambulatory Visit (HOSPITAL_COMMUNITY): Payer: Medicare HMO | Admitting: Physical Therapy

## 2019-11-27 ENCOUNTER — Other Ambulatory Visit: Payer: Self-pay

## 2019-11-27 ENCOUNTER — Encounter (HOSPITAL_COMMUNITY): Payer: Self-pay | Admitting: Physical Therapy

## 2019-11-27 DIAGNOSIS — G8929 Other chronic pain: Secondary | ICD-10-CM

## 2019-11-27 DIAGNOSIS — M25562 Pain in left knee: Secondary | ICD-10-CM

## 2019-11-27 DIAGNOSIS — M25662 Stiffness of left knee, not elsewhere classified: Secondary | ICD-10-CM

## 2019-11-27 NOTE — Therapy (Signed)
[place], [place], [address] Phone: [phone]   Fax:  [phone]  Physical Therapy Treatment and Discharge Note  Patient Details  Name: Raymond Hurst MRN: [id number] Date of Birth: Aug 10, 1950 Referring Provider (PT): Gaynelle Arabian, MD   PHYSICAL THERAPY DISCHARGE SUMMARY  Visits from Start of Care: 11  Current functional level related to goals / functional outcomes: See below   Remaining deficits: See below    Education / Equipment: See below  Plan: Patient agrees to discharge.  Patient goals were partially met. Patient is being discharged due to the physician's request.  ?????     2:43 PM, 11/27/19 Jerene Pitch, DPT Physical Therapy with [facility]  [phone] office    Encounter Date: 11/27/2019   PT End of Session - 11/27/19 1409    Visit Number 11    Number of Visits 13    Date for PT Re-Evaluation 12/05/19    Authorization Type humana medicare HMO - auth required, CP [id number]    Progress Note Due on Visit 17    PT Start Time 1411    PT Stop Time 1438    PT Time Calculation (min) 27 min    Activity Tolerance Patient tolerated treatment well    Behavior During Therapy WFL for tasks assessed/performed           Past Medical History:  Diagnosis Date  . Atrial fibrillation ([place])   . Cellulitis of right lower leg   . History of TIA (transient ischemic attack)    May 2020  . Lymphedema     Past Surgical History:  Procedure Laterality Date  . INNER EAR SURGERY    . ORTHOPEDIC SURGERY      There were no vitals filed for this visit.   Subjective Assessment - 11/27/19 1424    Subjective States overall he feels about 60% better. States that he has been having a bad week and last week was better  week doesn't know what caused the increased pain. States currently he has 3/10 in his left knee. States he has been having some pain in his back to which is deterring  him from doing some of his exercises. States he has been trying to do at least a couple of exercises every day.    Pertinent History bilateral LE lymphedema, right ankle pain, afib,    Patient Stated Goals to have less pain    Currently in Pain? Yes    Pain Score 3     Pain Location Knee    Pain Orientation Left    Pain Descriptors / Indicators Aching;Dull    Pain Onset More than a month ago              [facility] PT Assessment - 11/27/19 0001      Assessment   Medical Diagnosis left knee Osteoarthritis    Referring Provider (PT) Gaynelle Arabian, MD    Next MD Visit none currently      Observation/Other Assessments   Focus on Therapeutic Outcomes (FOTO)  59% function    was 51%      AROM   Left Knee Extension 6   lacking    Left Knee Flexion 120      Strength   Right Knee Flexion 5/5    Right Knee Extension 5/5    Left Knee Flexion 4+/5   pain in knee   Left Knee Extension 5/5  PT Education - 11/27/19 1420    Education Details on HEP, progress, continued importance of adhereing to HEP., on surgical options and questiosn about injections and anticiapted pain with surgery    Person(s) Educated Patient    Methods Explanation    Comprehension Verbalized understanding            PT Short Term Goals - 11/27/19 1415      PT SHORT TERM GOAL #1   Title Patient will report at least 25% improvement in overall symptoms and/or function to demonstrate improved functional mobility    Baseline 60% better    Time 2    Period Weeks    Status Achieved    Target Date 10/11/19      PT SHORT TERM GOAL #2   Title Patient will be independent in self management strategies to improve quality of life and functional outcomes.    Baseline working on    Time 2    Period Weeks    Status Achieved    Target Date 10/11/19             PT Long Term Goals - 11/27/19 1415      PT LONG TERM GOAL #1   Title Patient will report at least  50% improvement in overall symptoms and/or function to demonstrate improved functional mobility    Baseline 60% better    Time 6    Period Weeks    Status Achieved      PT LONG TERM GOAL #2   Title Patient will demonstrate 5-120 degrees of left knee ROM to demomstrate improved knee motion    Time 6    Period Weeks    Status On-going      PT LONG TERM GOAL #3   Title Patient will be able to ascend and descend stairs with reciprocal gait pattern with use of railing as needed to demonstrate improved functional mobility.    Baseline able to perform    Time 6    Period Weeks    Status Achieved      PT LONG TERM GOAL #4   Title Patient will improve on FOTO score to meet predicted outcomes to demonstrate improved functional mobility.    Baseline current 59% predicted 61%    Time 6    Period Weeks    Status On-going                 Plan - 11/27/19 1409    Clinical Impression Statement Patient progressing towards goals. Reviewed HEP and answered all questions. Increased pain noted on this date and continued ROM limitations noted. Sent HEP to patient via text secondary to patient losing papers. Paitent comfortable with HEP and is to discharge from PT to HEP secondary to independence in HEP at this time. Discussed following up with MD secondary to continued pain noted in left knee.    Personal Factors and Comorbidities Comorbidity 1;Comorbidity 2;Comorbidity 3+    Comorbidities afib,  Bilateral LE lymphedema, right ankle OA    Examination-Activity Limitations Locomotion Level;Sit;Transfers;Stairs    Examination-Participation Restrictions Cleaning;Community Activity    Stability/Clinical Decision Making Stable/Uncomplicated    Rehab Potential Good    PT Frequency 1x / week    PT Duration --   5 weeks   PT Treatment/Interventions ADLs/Self Care Home Management;Aquatic Therapy;Cryotherapy;Electrical Stimulation;Moist Heat;Traction;Balance training;Therapeutic exercise;Therapeutic  activities;Functional mobility training;Stair training;Gait training;DME Instruction;Neuromuscular re-education;Patient/family education;Manual techniques;Manual lymph drainage;Taping;Vasopneumatic Device;Passive range of motion    PT Next Visit Plan DC to HEP  PT Home Exercise Plan hamstring stretch, LAQ, SLR, calf stretch; STS, patella mobilizations; thomas stretch, LAQs           Patient will benefit from skilled therapeutic intervention in order to improve the following deficits and impairments:  Decreased endurance, Increased edema, Decreased activity tolerance, Decreased strength, Pain, Decreased knowledge of use of DME, Decreased mobility, Difficulty walking, Decreased balance, Decreased range of motion  Visit Diagnosis: Decreased range of motion (ROM) of left knee  Chronic pain of left knee     Problem List Patient Active Problem List   Diagnosis Date Noted  . Dizziness 06/08/2018  . TIA (transient ischemic attack) 06/08/2018  . Unspecified atrial fibrillation ([place]) 06/08/2018  . Near syncope 06/07/2018  . Hypokalemia 06/07/2018  . Lymphedema of leg 04/08/2013  . Cellulitis of right lower leg 04/04/2013  . Obesity, unspecified 04/04/2013  . Alcohol abuse 04/04/2013  . Cellulitis and abscess of leg 04/04/2013   2:42 PM, 11/27/19 Jerene Pitch, DPT Physical Therapy with [facility]  [phone] office  [place] [address] [place], [place], [address] Phone: [phone]   Fax:  [phone]  Name: Raymond Hurst MRN: [id number] Date of Birth: 1950-11-11

## 2019-11-27 NOTE — Patient Instructions (Signed)
Access Code: [email or web address] URL: [email or web address] Date: 11/27/2019 Prepared by: Yetta Glassman  Exercises Seated Hamstring Stretch - 1 x daily - 7 x weekly - 3 reps - 45 hold Modified Thomas Stretch - 1 x daily - 7 x weekly - 3 reps - 45 hold 4 Way Patellar Glide - 1 x daily - 5 x weekly - 3 sets - 10 reps - 5 hold Sit to Stand without Arm Support - 1 x daily - 5 x weekly - 3 sets - 10 reps Seated Long Arc Quad - 1 x daily - 5 x weekly - 3 sets - 10 reps - 5 hold Active Straight Leg Raise with Quad Set - 1 x daily - 3 x weekly - 3 sets - 10 reps - 5 hold Step Up - 1 x daily - 3 x weekly - 3 sets - 10 reps Lateral Step Down - 1 x daily - 3 x weekly - 3 sets - 10 reps

## 2019-12-12 ENCOUNTER — Encounter: Payer: Self-pay | Admitting: Dermatology

## 2019-12-12 ENCOUNTER — Ambulatory Visit (INDEPENDENT_AMBULATORY_CARE_PROVIDER_SITE_OTHER): Payer: Medicare HMO | Admitting: Dermatology

## 2019-12-12 ENCOUNTER — Other Ambulatory Visit: Payer: Self-pay

## 2019-12-12 DIAGNOSIS — L720 Epidermal cyst: Secondary | ICD-10-CM | POA: Diagnosis not present

## 2019-12-12 NOTE — Patient Instructions (Signed)
Dermatitis (Eczema)  The exact cause of dermatitis or eczema is unknown.  However, many adults develop eczema as a result of progressive dryness of skin that occurs with age, compounded with seasonal variations in humidity.  Certainly, dry skin and irritants do play a role.  In addition, one may have a family history of dry, itchy skin.   In eczema, the skin is dry and the rash is very itchy.  The rash may involve the face or cover a large part of the body.  When the rash becomes more established, the dry itchy skin may become thickened, leathery and sometimes darker in coloration.  The more the person scratches, the worse the rash is and the thicker the skin gets.   Many things may affect the severity of the condition.  Many will find that during the winter months when the humidity is very low, the dryness and itchiness will be worse.  On the other hand, some people are easily irritated by sweat and will find that they have more problems during the summer months.  Most patients note an increase in itching at times when there are sudden changes in temperature.  Other irritants such as harsh soaps (which include many of the antibacterial soaps) or detergents and exposure to wool are common problems.  Sometimes eczema may become infected by bacteria, yeast or viruses.  This is called "secondary infection".  Bacterial secondary infection is the most common and is often a result of scratching.  The rash gets very red with pus filled pimples and scabs.  If this occurs, your doctor will prescribe an antibiotic to control the infection.  What can I expect from treatment? Unfortunately, there is no magic cure that will always eliminate dermatitis. The main objective in treating dermatitis is to decrease the skin eruption and relieve the itching.  There are a number of different forms of the medications that are used for dermatitis, and medications that are best suited to control the problem will be chosen.   Primarily, topical medications will be used.  Because the skin is excessively dry, lubricants will be prescribed that will effectively decrease the dryness.  Daily bathing is a useful way to get water into the skin but bathing should be brief (no more than 10 minutes unless otherwise indicated by your physician).  Effective lubricants (Cetaphil cream or lotion, CeraVe cream or lotion [available at [employer], [facility], and [facility]], Aquaphor, and plain Vaseline) can be used immediately after the bath or shower to trap moisture within the skin.  It is best to "pat dry" after a bathing and then mix your moisturizer (cream or lotion) with the moisture left on your skin.  Cortisone (steroid) is a medicated ointment or cream (ex: triamcinolone, hydrocortisone, desonide, betamethasone, clobetasol) that may also be suggested and is very important in decreasing the itching and controlling the inflammation.  Your doctor will suggest a cortisone treatment that is most appropriate for the severity and location of the dermatitis that is to be treated.  Once the affected area clears up, it is best to discontinue the use of the cortisone preparation due to possibility of atrophy (skin thinning), but continue the regular use of lubrication to try to prevent new areas of dermatitis from occurring.  Of course, if itching or a new rash begins, the cortisone preparation may have to be reintroduced.  Anti-inflammatory creams and ointments which are NOT steroids such as Protopic and Elidel may also be prescribed.  Certain internal medicines called antihistamines (Atarax,  Benadryl, Hydroxyzine) may help control itching.  They primarily help with the itching by introducing some drowsiness and allowing the child to sleep at night.  Some systemic antibiotics are often useful as well for controlling the secondary infection and often enable infected dermatitis to be controlled.  Other important forms of treatment: 1. Avoid contact with  substances you know cause itching.  These may include soaps, detergents, certain perfumes, dust, grass, weeds, wools and other types of scratchy clothing. 2. You may bathe daily.  Use no soap or the minimal amount necessary to get clean.  Always use lubrication immediately after bathing (3 minute rule). Avoid very hot or very cold water and bubble baths.  When drying with a towel - pat dry and do not rub.  Use a mild, unscented soap (Dove, CeraVe Cleanser, Lever 2000, or Cetaphil). 3. Try to keep the temperature and humidity in the home fairly constant.  Use a bedroom air conditioner in the summer and a vaporizer in the winter.  It is very important that the vaporizer or humidifier be cleaned frequently and thoroughly since molds may grow and cause allergies. 4. Try to avoid scratching.  Dermatitis is often called "the itch that rashes" and it is known that scratching plays a significant role in making dermatitis worse.  Keeping the nails short and well-filed as well as using other measures to help keep the child from itching are helpful.  The National Eczema Association (www.eczema-assn.org) is a wonderful organization that sends out a Mudlogger with useful information on these types of conditions. Please consider contacting them at the above website or by address: National Eczema Association for Science and Education, [place], [place], [place], [place]

## 2019-12-20 ENCOUNTER — Encounter: Payer: Self-pay | Admitting: *Deleted

## 2019-12-23 ENCOUNTER — Ambulatory Visit: Payer: Medicare HMO | Admitting: Cardiology

## 2019-12-24 ENCOUNTER — Other Ambulatory Visit: Payer: Self-pay

## 2019-12-24 ENCOUNTER — Ambulatory Visit (INDEPENDENT_AMBULATORY_CARE_PROVIDER_SITE_OTHER): Payer: Medicare HMO

## 2019-12-24 DIAGNOSIS — Z4802 Encounter for removal of sutures: Secondary | ICD-10-CM | POA: Insufficient documentation

## 2019-12-24 NOTE — Progress Notes (Signed)
Patient here today for nurse to see for suture removal x 7 sutures removed healing well.

## 2019-12-25 ENCOUNTER — Encounter: Payer: Self-pay | Admitting: Dermatology

## 2019-12-25 NOTE — Progress Notes (Addendum)
   Follow-Up Visit   Subjective  Raymond Hurst is a 69 y.o. male who presents for the following: Cyst (neck).  Large cyst Location: Left submandibular Duration:  Quality:  Associated Signs/Symptoms: Modifying Factors:  Severity:  Timing: Context: Patient requests removal  Objective  Well appearing patient in no apparent distress; mood and affect are within normal limits.  A focused examination was performed including Head and neck. Relevant physical exam findings are noted in the Assessment and Plan.   Assessment & Plan    Epidermal cyst Left Anterior Neck  Skin excision - Left Anterior Neck  Lesion length (cm):  2.6 Lesion width (cm):  2 Margin per side (cm):  0 Total excision diameter (cm):  2.6 Informed consent: discussed and consent obtained   Timeout: patient name, date of birth, surgical site, and procedure verified   Anesthesia: the lesion was anesthetized in a standard fashion   Anesthetic:  1% lidocaine w/ epinephrine 1-100,000 local infiltration Instrument used: #15 blade   Hemostasis achieved with: pressure and electrodesiccation   Outcome: patient tolerated procedure well with no complications   Post-procedure details: sterile dressing applied and wound care instructions given   Dressing type: bandage, petrolatum and pressure dressing    Skin repair - Left Anterior Neck Complexity:  Intermediate Final length (cm):  2.6 Informed consent: discussed and consent obtained   Timeout: patient name, date of birth, surgical site, and procedure verified   Reason for type of repair: reduce tension to allow closure, reduce the risk of dehiscence, infection, and necrosis and reduce subcutaneous dead space and avoid a hematoma   Subcutaneous layers (deep stitches):  Suture size:  5-0 Suture type: Vicryl (polyglactin 910)   Stitches:  Buried vertical mattress Fine/surface layer approximation (top stitches):  Suture size:  5-0 Suture type: nylon    Specimen 1 -  Surgical pathology Differential Diagnosis: cyst Check Margins: No     I, Lavonna Monarch, MD, have reviewed all documentation for this visit.  The documentation on 01/05/20 for the exam, diagnosis, procedures, and orders are all accurate and complete.

## 2019-12-31 ENCOUNTER — Ambulatory Visit: Payer: Medicare HMO

## 2020-01-30 ENCOUNTER — Other Ambulatory Visit (HOSPITAL_COMMUNITY)
Admission: RE | Admit: 2020-01-30 | Discharge: 2020-01-30 | Disposition: A | Discharge status: Home / Self Care | Payer: Medicare HMO | Source: Ambulatory Visit | Attending: Cardiology | Admitting: Cardiology

## 2020-01-30 DIAGNOSIS — I4819 Other persistent atrial fibrillation: Secondary | ICD-10-CM | POA: Diagnosis not present

## 2020-01-30 DIAGNOSIS — Z79899 Other long term (current) drug therapy: Secondary | ICD-10-CM | POA: Insufficient documentation

## 2020-01-30 LAB — BASIC METABOLIC PANEL
Anion gap: 9 (ref 5–15)
BUN: 12 mg/dL (ref 8–23)
CO2: 24 mmol/L (ref 22–32)
Calcium: 9.1 mg/dL (ref 8.9–10.3)
Chloride: 105 mmol/L (ref 98–111)
Creatinine, Ser: 0.97 mg/dL (ref 0.61–1.24)
GFR, Estimated: >60 mL/min (ref >60)
Glucose, Bld: 122 mg/dL — ABNORMAL HIGH (ref 70–99)
Potassium: 3.9 mmol/L (ref 3.5–5.1)
Sodium: 138 mmol/L (ref 135–145)

## 2020-01-30 LAB — CBC
HCT: 46.2 % (ref 39.0–52.0)
Hemoglobin: 15.5 g/dL (ref 13.0–17.0)
MCH: 34 pg (ref 26.0–34.0)
MCHC: 33.5 g/dL (ref 30.0–36.0)
MCV: 101.3 fL — ABNORMAL HIGH (ref 80.0–100.0)
Platelets: 151 10*3/uL (ref 150–400)
RBC: 4.56 MIL/uL (ref 4.22–5.81)
RDW: 13 % (ref 11.5–15.5)
WBC: 6 10*3/uL (ref 4.0–10.5)
nRBC: 0 % (ref 0.0–0.2)

## 2020-02-03 ENCOUNTER — Ambulatory Visit (INDEPENDENT_AMBULATORY_CARE_PROVIDER_SITE_OTHER): Payer: Medicare HMO | Admitting: Cardiology

## 2020-02-03 ENCOUNTER — Other Ambulatory Visit: Payer: Self-pay

## 2020-02-03 ENCOUNTER — Encounter: Payer: Self-pay | Admitting: Cardiology

## 2020-02-03 VITALS — BP 130/74 | HR 78 | Resp 16 | Ht 72.0 in | Wt 339.0 lb

## 2020-02-03 DIAGNOSIS — I4819 Other persistent atrial fibrillation: Secondary | ICD-10-CM

## 2020-02-03 MED ORDER — APIXABAN 5 MG PO TABS: 5.0000 mg | ORAL_TABLET | Freq: Two times a day (BID) | ORAL | 0 refills | Status: DC

## 2020-02-03 NOTE — Patient Instructions (Signed)
Medication Instructions:  °Your physician recommends that you continue on your current medications as directed. Please refer to the Current Medication list given to you today. ° °*If you need a refill on your cardiac medications before your next appointment, please call your pharmacy* ° ° °Lab Work: °None today °If you have labs (blood work) drawn today and your tests are completely normal, you will receive your results only by: °• MyChart Message (if you have MyChart) OR °• A paper copy in the mail °If you have any lab test that is abnormal or we need to change your treatment, we will call you to review the results. ° ° °Testing/Procedures: °None today ° ° °Follow-Up: °At [facility], you and your health needs are our priority.  As part of our continuing mission to provide you with exceptional heart care, we have created designated Provider Care Teams.  These Care Teams include your primary Cardiologist (physician) and Advanced Practice Providers (APPs -  Physician Assistants and Nurse Practitioners) who all work together to provide you with the care you need, when you need it. ° °We recommend signing up for the patient portal called "MyChart".  Sign up information is provided on this After Visit Summary.  MyChart is used to connect with patients for Virtual Visits (Telemedicine).  Patients are able to view lab/test results, encounter notes, upcoming appointments, etc.  Non-urgent messages can be sent to your provider as well.   °To learn more about what you can do with MyChart, go to [email or web address].   ° °Your next appointment:   °6 month(s) ° °The format for your next appointment:   °In Person ° °Provider:   °Samuel McDowell, MD ° ° °Other Instructions °None ° ° ° ° °Thank you for choosing [facility] ! ° ° ° ° ° ° ° ° °

## 2020-02-03 NOTE — Telephone Encounter (Signed)
Refill for Eliquis 5 mg tablet

## 2020-02-03 NOTE — Progress Notes (Signed)
Cardiology Office Note  Date: 02/03/2020   ID: Raymond Hurst, Raymond Hurst January 24, 1951, MRN [id number]  PCP:  Roseanna Rainbow, PA-C (Inactive)  Cardiologist:  Nona Dell, MD Electrophysiologist:  None   Chief Complaint  Patient presents with  . Cardiac follow-up    History of Present Illness: Raymond Hurst is a 70 y.o. male last seen in July 2021.  He is here for a routine visit.  He does not report any sense of palpitations.  States that he had a transient episode of lightheadedness while he was standing in line checking out at a store.  This has not been a recurring symptom however.  I reviewed his recent lab work as noted below.  He does not report any spontaneous bleeding problems on Eliquis and otherwise continues on stable dose of Lopressor.  We talked today about his alcohol intake with recommended reduction.  Past Medical History:  Diagnosis Date  . Atrial fibrillation ([facility])   . Cellulitis of right lower leg   . History of TIA (transient ischemic attack)    May 2020  . Lymphedema     Past Surgical History:  Procedure Laterality Date  . INNER EAR SURGERY    . ORTHOPEDIC SURGERY      Current Outpatient Medications  Medication Sig Dispense Refill  . apixaban (ELIQUIS) 5 MG TABS tablet Take 1 tablet (5 mg total) by mouth 2 (two) times daily. 60 tablet 0  . ibuprofen (ADVIL) 200 MG tablet Take 200 mg by mouth every 6 (six) hours as needed.    . metoprolol tartrate (LOPRESSOR) 25 MG tablet Take 1 tablet (25 mg total) by mouth 2 (two) times daily. 60 tablet 6   No current facility-administered medications for this visit.   Allergies:  Patient has no known allergies.   ROS: No frank syncope.  Physical Exam: VS:  BP 130/74   Pulse 78   Resp 16   Ht 6' (1.829 m)   Wt (!) 339 lb (153.8 kg)   SpO2 98%   BMI 45.98 kg/m , BMI Body mass index is 45.98 kg/m.  Wt Readings from Last 3 Encounters:  02/03/20 (!) 339 lb (153.8 kg)  08/20/19 (!) 314 lb (142.4 kg)   04/29/19 (!) 318 lb (144.2 kg)    General: Patient appears comfortable at rest. HEENT: Conjunctiva and lids normal, wearing a mask. Neck: Supple, no elevated JVP or carotid bruits, no thyromegaly. Lungs: Clear to auscultation, nonlabored breathing at rest. Cardiac: Irregularly irregular, no S3 or significant systolic murmur, no pericardial rub. Extremities: No progressive lymphedema.  ECG:  An ECG dated 08/20/2019 was personally reviewed today and demonstrated:  Atrial fibrillation.  Recent Labwork: 01/30/2020: BUN 12; Creatinine, Ser 0.97; Hemoglobin 15.5; Platelets 151; Potassium 3.9; Sodium 138     Component Value Date/Time   CHOL 129 06/08/2018 0634   TRIG 84 06/08/2018 0634   HDL 31 (L) 06/08/2018 0634   CHOLHDL 4.2 06/08/2018 0634   VLDL 17 06/08/2018 0634   LDLCALC 81 06/08/2018 0634    Other Studies Reviewed Today:  Echocardiogram 05/06/2019: 1. Left ventricular ejection fraction, by estimation, is 55 to 60%. The  left ventricle has normal function. The left ventricle has no regional  wall motion abnormalities. There is mild left ventricular hypertrophy.  Left ventricular diastolic parameters  are indeterminate.  2. Right ventricular systolic function is normal. The right ventricular  size is normal.  3. The mitral valve is normal in structure. Trivial mitral valve  regurgitation.  No evidence of mitral stenosis.  4. The aortic valve has an indeterminant number of cusps. Aortic valve  regurgitation is not visualized. No aortic stenosis is present.  5. Aortic dilatation noted. There is mild to moderate dilatation of the  aortic root measuring 43 mm.  6. Indeterminant PASP, inadequate TR jet.  7. The inferior vena cava is normal in size with greater than 50%  respiratory variability, suggesting right atrial pressure of 3 mmHg.   Lower extremity arterial Dopplers 09/06/2019: IMPRESSION: Ultrasound demonstrates no segmental occlusion or  high-grade stenosis.  Assessment and Plan:  Permanent atrial fibrillation.  CHA2DS2-VASc score is 3.  Plan to continue current dose of Lopressor as well as Eliquis for stroke prophylaxis.  I reviewed his recent lab work, hemoglobin normal and no reported spontaneous bleeding episodes.   Medication Adjustments/Labs and Tests Ordered: Current medicines are reviewed at length with the patient today.  Concerns regarding medicines are outlined above.   Tests Ordered: No orders of the defined types were placed in this encounter.   Medication Changes: No orders of the defined types were placed in this encounter.   Disposition:  Follow up 6 months in the [place] office.  Signed, Satira Sark, MD, [facility] 02/03/2020 1:28 PM    [place] [place] [facility] [place], [place], [place] [address] Phone: [phone]; Fax: [phone]

## 2020-02-10 ENCOUNTER — Telehealth: Payer: Self-pay | Admitting: *Deleted

## 2020-02-10 NOTE — Telephone Encounter (Signed)
Patient informed. Copy sent to PCP °

## 2020-02-10 NOTE — Telephone Encounter (Signed)
-----   Message from Erma Heritage, [place] sent at 01/30/2020  4:22 PM EST ----- Patient of Dr. Domenic Polite - Please let the patient know his electrolytes and kidney function remain within a normal range. Hgb and platelets are also stable. Continue current medication regimen.

## 2020-02-25 DIAGNOSIS — F99 Mental disorder, not otherwise specified: Secondary | ICD-10-CM | POA: Diagnosis not present

## 2020-02-25 DIAGNOSIS — F1020 Alcohol dependence, uncomplicated: Secondary | ICD-10-CM | POA: Diagnosis not present

## 2020-02-25 DIAGNOSIS — R0602 Shortness of breath: Secondary | ICD-10-CM | POA: Diagnosis not present

## 2020-02-27 ENCOUNTER — Encounter: Payer: Self-pay | Admitting: Neurology

## 2020-02-28 ENCOUNTER — Encounter: Payer: Self-pay | Admitting: Cardiology

## 2020-02-28 ENCOUNTER — Telehealth: Payer: Self-pay | Admitting: Cardiology

## 2020-02-28 NOTE — Telephone Encounter (Signed)
[name] (wife) called stating that they received a telephone call from [facility] in regard to a new diagnosis. Neurocognitive disorder F99  They are wanting to know if this will interfere with patients heart and heart medications.

## 2020-02-28 NOTE — Telephone Encounter (Signed)
See care everywhere for most recent visit from [facility] - does not look like any new medications were started, but will fwd to provider for advice.

## 2020-03-01 NOTE — Telephone Encounter (Signed)
From what I can read in Care Everywhere, it appears that they were concerned about memory loss, nothing to do with his cardiac status.  They did mention possibly sending him to a neurologist for evaluation.  Would continue current course of treatment from a cardiac perspective.

## 2020-03-02 NOTE — Telephone Encounter (Signed)
I spoke with wife and they will continue cardiac meds as before.

## 2020-03-20 ENCOUNTER — Telehealth: Payer: Self-pay | Admitting: Cardiology

## 2020-03-20 ENCOUNTER — Other Ambulatory Visit: Payer: Self-pay | Admitting: Cardiology

## 2020-03-20 NOTE — Telephone Encounter (Signed)
I'm only up in [place] occasionally and have limited availability here

## 2020-03-20 NOTE — Telephone Encounter (Signed)
New message    Patient would like to switch from Dr Domenic Polite to Dr Johnsie Cancel is this ok? Husband and wife would like to go to same provider

## 2020-03-20 NOTE — Telephone Encounter (Signed)
Pt last saw Dr Domenic Polite 02/03/20, last labs 01/30/20 Creat 0.97, age 70, weight 153.8kg, based on specified criteria pt is on appropriate dosage of Eliquis 5mg  BID for afib.  Will refill rx.

## 2020-03-25 ENCOUNTER — Other Ambulatory Visit: Payer: Self-pay

## 2020-03-25 ENCOUNTER — Ambulatory Visit: Payer: Medicare HMO | Admitting: Internal Medicine

## 2020-03-25 ENCOUNTER — Encounter: Payer: Self-pay | Admitting: Internal Medicine

## 2020-03-25 DIAGNOSIS — R0600 Dyspnea, unspecified: Secondary | ICD-10-CM | POA: Diagnosis not present

## 2020-03-25 DIAGNOSIS — R0609 Other forms of dyspnea: Secondary | ICD-10-CM

## 2020-03-25 NOTE — Patient Instructions (Signed)
To get the most out of exercise, you need to be continuously aware that you are short of breath, but never out of breath, for 30 minutes daily. As you improve, it will actually be easier for you to do the same amount of exercise  in  30 minutes so always push to the level where you are short of breath.     Please remember to go to the  x-ray department  @  [facility] for your tests - we will call you with the results when they are available      We will schedule you a set of PFTs next available with office to follow same day

## 2020-03-25 NOTE — Progress Notes (Signed)
Raymond Hurst, male    DOB: [birth year],   MRN: [id number]   Brief patient profile:  56 yowm quit smoking 1992 due to cough which completely resolved then  new onset afib May  2020 [facility] and "breathing downhill since" with baseline wt 285 in 2020 referred to pulmonary clinic in [facility]  03/25/2020 by Carolee Rota NP   For cough/ sob      History of Present Illness  03/25/2020  Pulmonary/ 1st office eval/  / [facility]  Chief Complaint  Patient presents with   Pulmonary Consult    Referred by Carolee Rota, NP.  Pt c/o DOE x 3-4 months. He states gets winded walking 1 flight of stairs. He also c/o cough- occ prod with clear to yellow sputum.     Dyspnea:  MMRC3 = gradually worse to point can't walk 100 yards even at a slow pace at a flat grade s stopping due to sob   Cough: usually worse  in am p stirs  Sleep: bed is flat/ L side down/ 2 pillows SABA use: no inhalers  No obvious day to day or daytime variability or assoc excess/ purulent sputum or mucus plugs or hemoptysis or cp or chest tightness, subjective wheeze or overt sinus or hb symptoms.   Sleeping as above without nocturnal  or early am exacerbation  of respiratory  c/o's or need for noct saba. Also denies any obvious fluctuation of symptoms with weather or environmental changes or other aggravating or alleviating factors except as outlined above   No unusual exposure hx or h/o childhood pna/ asthma or knowledge of premature birth.  Current Allergies, Complete Past Medical History, Past Surgical History, Family History, and Social History were reviewed in [employer] record.  ROS  The following are not active complaints unless bolded Hoarseness, sore throat, dysphagia, dental problems, itching, sneezing,  nasal congestion or discharge of excess mucus or purulent secretions, ear ache,   fever, chills, sweats, unintended wt loss or wt gain, classically pleuritic or exertional cp,  orthopnea  pnd or arm/hand swelling  or leg swelling, presyncope, palpitations, abdominal pain, anorexia, nausea, vomiting, diarrhea  or change in bowel habits or change in bladder habits, change in stools or change in urine, dysuria, hematuria,  rash, arthralgias, visual complaints, headache, numbness, weakness or ataxia or problems with walking or coordination,  change in mood or  memory.           Past Medical History:  Diagnosis Date   Atrial fibrillation ([place])    Cellulitis of right lower leg    History of TIA (transient ischemic attack)    May 2020   Lymphedema     Outpatient Medications Prior to Visit  Medication Sig Dispense Refill   ELIQUIS 5 MG TABS tablet TAKE 1 TABLET BY MOUTH TWICE A DAY 60 tablet 6   ibuprofen (ADVIL) 200 MG tablet Take 200 mg by mouth every 6 (six) hours as needed.     metoprolol tartrate (LOPRESSOR) 25 MG tablet Take 1 tablet (25 mg total) by mouth 2 (two) times daily. 60 tablet 6   No facility-administered medications prior to visit.     Objective:     BP 124/82 (BP Location: Right Arm, Cuff Size: Normal)    Pulse 71    Temp 97.7 F (36.5 C) (Temporal)    Ht 6' (1.829 m)    Wt (!) 335 lb (152 kg)    SpO2 98% Comment: on RA   BMI  45.43 kg/m   SpO2: 98 % (on RA)   Obese wm nad   HEENT : pt wearing mask not removed for exam due to covid - 19 concerns.   NECK :  without JVD/Nodes/TM/ nl carotid upstrokes bilaterally   LUNGS: no acc muscle use,  Min barrel  contour chest wall with bilateral  slightly decreased bs s audible wheeze and  without cough on insp or exp maneuvers and min  Hyperresonant  to  percussion bilaterally     CV:  RRR  no s3 or murmur or increase in P2, and trace  Pitting edema / elastic hose  ABD:  soft and nontender with pos end  insp Hoover's  in the supine position. No bruits or organomegaly appreciated, bowel sounds nl  MS:   Nl gait/  ext warm without deformities, calf tenderness, cyanosis or clubbing No obvious joint  restrictions   SKIN: warm and dry without lesions    NEURO:  alert, approp, nl sensorium with  no motor or cerebellar deficits apparent.        CXR PA and Lateral:   03/25/2020 :    I personally reviewed images and agree with radiology impression as follows:     Did not go for cxr as rec      Assessment   DOE (dyspnea on exertion) Onset 2020 with afib and baseline wt 285 then - Echo 05/06/19 TDS, no obvious problem identified on limited study  -  03/25/2020   Walked RA  approx   300 ft  @ moderate pace  stopped due to  Joint pain, min sob with sats still 96%      When respiratory symptoms begin or become refractory well after a patient reports complete smoking cessation,  Especially when this wasn't the case while they were smoking, a red flag is raised based on the work of Dr Kris Mouton which states:  if you quit smoking when your best day FEV1 is still well preserved it is highly unlikely you will progress to severe disease.  That is to say, once the smoking stops,  the symptoms should not suddenly erupt or markedly worsen.  If so, the differential diagnosis should include  obesity/deconditioning,  LPR/Reflux/Aspiration syndromes,  occult CHF, or  especially side effect of medications commonly used in this population (none of the usual suspects listed).  Also concern about occult PE in Morbidly obese pts, but he is already on DOAC for afib   >>> f/u with pfts w/a    Morbid obesity due to excess calories (HCC) Body mass index is 45.43 kg/m.  -    Lab Results  Component Value Date   TSH 1.189 06/08/2018     Contributing to gerd risk/ doe/reviewed the need and the process to achieve and maintain neg calorie balance > defer f/u primary care including intermittently monitoring thyroid status     Each maintenance medication was reviewed in detail including emphasizing most importantly the difference between maintenance and prns and under what circumstances the prns are to be  triggered using an action plan format where appropriate.  Total time for H and P, chart review, counseling,  directly observing portions of ambulatory 02 saturation study/ and generating customized AVS unique to this office visit / same day charting/ new pt =  40 min                    Christinia Gully, MD 03/25/2020

## 2020-03-26 ENCOUNTER — Encounter: Payer: Self-pay | Admitting: Internal Medicine

## 2020-03-26 NOTE — Assessment & Plan Note (Addendum)
Onset 2020 with afib and baseline wt 285 then - Echo 05/06/19 TDS, no obvious problem identified on limited study  -  03/25/2020   Walked RA  approx   300 ft  @ moderate pace  stopped due to  Joint pain, min sob with sats still 96%      When respiratory symptoms begin or become refractory well after a patient reports complete smoking cessation,  Especially when this wasn't the case while they were smoking, a red flag is raised based on the work of Dr Kris Mouton which states:  if you quit smoking when your best day FEV1 is still well preserved it is highly unlikely you will progress to severe disease.  That is to say, once the smoking stops,  the symptoms should not suddenly erupt or markedly worsen.  If so, the differential diagnosis should include  obesity/deconditioning,  LPR/Reflux/Aspiration syndromes,  occult CHF, or  especially side effect of medications commonly used in this population (none of the usual suspects listed).  Also concern about occult PE in Morbidly obese pts, but he is already on DOAC for afib   >>> f/u with pfts w/a

## 2020-03-26 NOTE — Assessment & Plan Note (Signed)
Body mass index is 45.43 kg/m.  -    Lab Results  Component Value Date   TSH 1.189 06/08/2018     Contributing to gerd risk/ doe/reviewed the need and the process to achieve and maintain neg calorie balance > defer f/u primary care including intermittently monitoring thyroid status     Each maintenance medication was reviewed in detail including emphasizing most importantly the difference between maintenance and prns and under what circumstances the prns are to be triggered using an action plan format where appropriate.  Total time for H and P, chart review, counseling,  directly observing portions of ambulatory 02 saturation study/ and generating customized AVS unique to this office visit / same day charting/ new pt =  40 min

## 2020-03-30 ENCOUNTER — Telehealth: Payer: Self-pay | Admitting: Internal Medicine

## 2020-03-30 NOTE — Telephone Encounter (Signed)
Order was just placed 2/23 and it is documented we called [facility] & left vm to schedule pft.  Called pt & made him aware pft's are only scheduled on Tuesday's at [facility] and gave him [facility]'s phone # so he can call them if he doesn't hear from them in a few days.  Nothing further needed.

## 2020-04-08 ENCOUNTER — Telehealth: Payer: Self-pay | Admitting: Internal Medicine

## 2020-04-08 DIAGNOSIS — R0600 Dyspnea, unspecified: Secondary | ICD-10-CM

## 2020-04-08 DIAGNOSIS — R0609 Other forms of dyspnea: Secondary | ICD-10-CM

## 2020-04-08 NOTE — Telephone Encounter (Signed)
Pt wife Raymond Hurst called on behalf of pt to get pt scheduled for pft and chest x ray that was ordered 2/23. Informed pt wife that the x ray was just walk in and doesn't need to be scheduled. Wanting to get scheduled at [facility] office for pft rather than [facility] has not reached out to pt and if there is a cost difference, would like to come to [place]. Thank you, Please advise [phone]

## 2020-04-08 NOTE — Telephone Encounter (Signed)
Spoke with patient wife today she said she is fine with the [facility] patient only comes into the office twice a year and if there is a emergency they are ok with seeing an APP if they can not get an appointment with you.

## 2020-04-08 NOTE — Telephone Encounter (Signed)
Spoke with the pt's spouse  I cancelled the [facility] order PFT and placed new order for [place]  Appt for PFT scheduled and spouse states pt will get his own PCR covid test done  She is aware that this must be PCR test and needs to be done 3 days prior to PFT  She was made aware that the pt must provide proof of negative test or PFT will have to be rescheduled cxr that we had intended on him to have done at [facility] was cancelled and reordered to be done at [place]

## 2020-04-08 NOTE — Telephone Encounter (Signed)
Since patient is wanting to coming to [facility] - can the [facility] order for PFT be canceled and an order be placed for pft to be done in [place]? -pr

## 2020-04-14 ENCOUNTER — Other Ambulatory Visit (HOSPITAL_COMMUNITY)
Admission: RE | Admit: 2020-04-14 | Discharge: 2020-04-14 | Disposition: A | Discharge status: Home / Self Care | Payer: Medicare HMO | Source: Ambulatory Visit | Attending: Internal Medicine | Admitting: Internal Medicine

## 2020-04-14 DIAGNOSIS — Z20822 Contact with and (suspected) exposure to covid-19: Secondary | ICD-10-CM | POA: Diagnosis not present

## 2020-04-14 DIAGNOSIS — Z01812 Encounter for preprocedural laboratory examination: Secondary | ICD-10-CM | POA: Diagnosis not present

## 2020-04-14 LAB — SARS CORONAVIRUS 2 (TAT 6-24 HRS): SARS Coronavirus 2: NEGATIVE

## 2020-04-17 ENCOUNTER — Ambulatory Visit (INDEPENDENT_AMBULATORY_CARE_PROVIDER_SITE_OTHER): Payer: Medicare HMO | Admitting: Internal Medicine

## 2020-04-17 ENCOUNTER — Other Ambulatory Visit: Payer: Self-pay

## 2020-04-17 DIAGNOSIS — R0600 Dyspnea, unspecified: Secondary | ICD-10-CM | POA: Diagnosis not present

## 2020-04-17 DIAGNOSIS — R0609 Other forms of dyspnea: Secondary | ICD-10-CM

## 2020-04-17 LAB — PULMONARY FUNCTION TEST
DL/VA % pred: 106 %
DL/VA: 4.35 ml/min/mmHg/L
DLCO cor % pred: 93 %
DLCO cor: 25.32 ml/min/mmHg
DLCO unc % pred: 93 %
DLCO unc: 25.32 ml/min/mmHg
FEF 25-75 Post: 2.97 L/sec
FEF 25-75 Pre: 2.33 L/sec
FEF2575-%Change-Post: 27 %
FEF2575-%Pred-Post: 111 %
FEF2575-%Pred-Pre: 87 %
FEV1-%Change-Post: 5 %
FEV1-%Pred-Post: 92 %
FEV1-%Pred-Pre: 87 %
FEV1-Post: 3.19 L
FEV1-Pre: 3.03 L
FEV1FVC-%Change-Post: 7 %
FEV1FVC-%Pred-Pre: 103 %
FEV6-%Change-Post: 0 %
FEV6-%Pred-Post: 88 %
FEV6-%Pred-Pre: 88 %
FEV6-Post: 3.9 L
FEV6-Pre: 3.93 L
FEV6FVC-%Change-Post: 0 %
FEV6FVC-%Pred-Post: 105 %
FEV6FVC-%Pred-Pre: 104 %
FVC-%Change-Post: -1 %
FVC-%Pred-Post: 83 %
FVC-%Pred-Pre: 84 %
FVC-Post: 3.91 L
FVC-Pre: 3.97 L
Post FEV1/FVC ratio: 82 %
Post FEV6/FVC ratio: 100 %
Pre FEV1/FVC ratio: 76 %
Pre FEV6/FVC Ratio: 99 %
RV % pred: 94 %
RV: 2.34 L
TLC % pred: 85 %
TLC: 6.21 L

## 2020-04-17 NOTE — Progress Notes (Signed)
PFT done today. 

## 2020-04-20 ENCOUNTER — Telehealth: Payer: Self-pay | Admitting: Internal Medicine

## 2020-04-20 NOTE — Telephone Encounter (Signed)
We ordered the cxr and the order is still in. Not sure why he did not get this done yet. LMTCB for [name] (on DPR)

## 2020-04-20 NOTE — Progress Notes (Signed)
LMTCB for the pt 

## 2020-04-21 ENCOUNTER — Ambulatory Visit (INDEPENDENT_AMBULATORY_CARE_PROVIDER_SITE_OTHER): Payer: Medicare HMO

## 2020-04-21 DIAGNOSIS — R0609 Other forms of dyspnea: Secondary | ICD-10-CM

## 2020-04-21 DIAGNOSIS — R0600 Dyspnea, unspecified: Secondary | ICD-10-CM

## 2020-04-21 NOTE — Progress Notes (Signed)
Spoke with the pt's spouse and made aware of results ok per [facility]

## 2020-04-21 NOTE — Telephone Encounter (Signed)
Spoke with the pt's spouse, ok per DPR  She is aware that pt's order for cxr was already placed and that he can go to [facility] or [facility] in [place] and get this done  She states he prefers to have this done is [place] due to ins

## 2020-04-22 ENCOUNTER — Telehealth: Payer: Self-pay | Admitting: Internal Medicine

## 2020-04-22 NOTE — Telephone Encounter (Signed)
I called and spoke with the pt's spouse, Bettey Mare, ok per DPR and gave pt's cxr results  She stated that she was awaiting a call from our office manager  States that she called 04/21/20 and msg was to have been sent and she never received a call back  I asked if there was something I could help her w/ and she then asked if I was the staff member that she spoke with 3/22 I told her that I spoke with her about pt getting cxr  She then stated that she could not speak with me about her issue and again requested to speak with Lauren  Will forward to [facility]

## 2020-04-22 NOTE — Progress Notes (Signed)
Spoke with pt and notified of results per Dr. Wert. Pt verbalized understanding and denied any questions. 

## 2020-04-23 IMAGING — CR PORTABLE CHEST - 1 VIEW
1 series · 1 of 1 positions shown · non-contrast
Comparison: 04/02/2013

CLINICAL DATA: Presyncope

EXAM:
PORTABLE CHEST 1 VIEW

[portable]
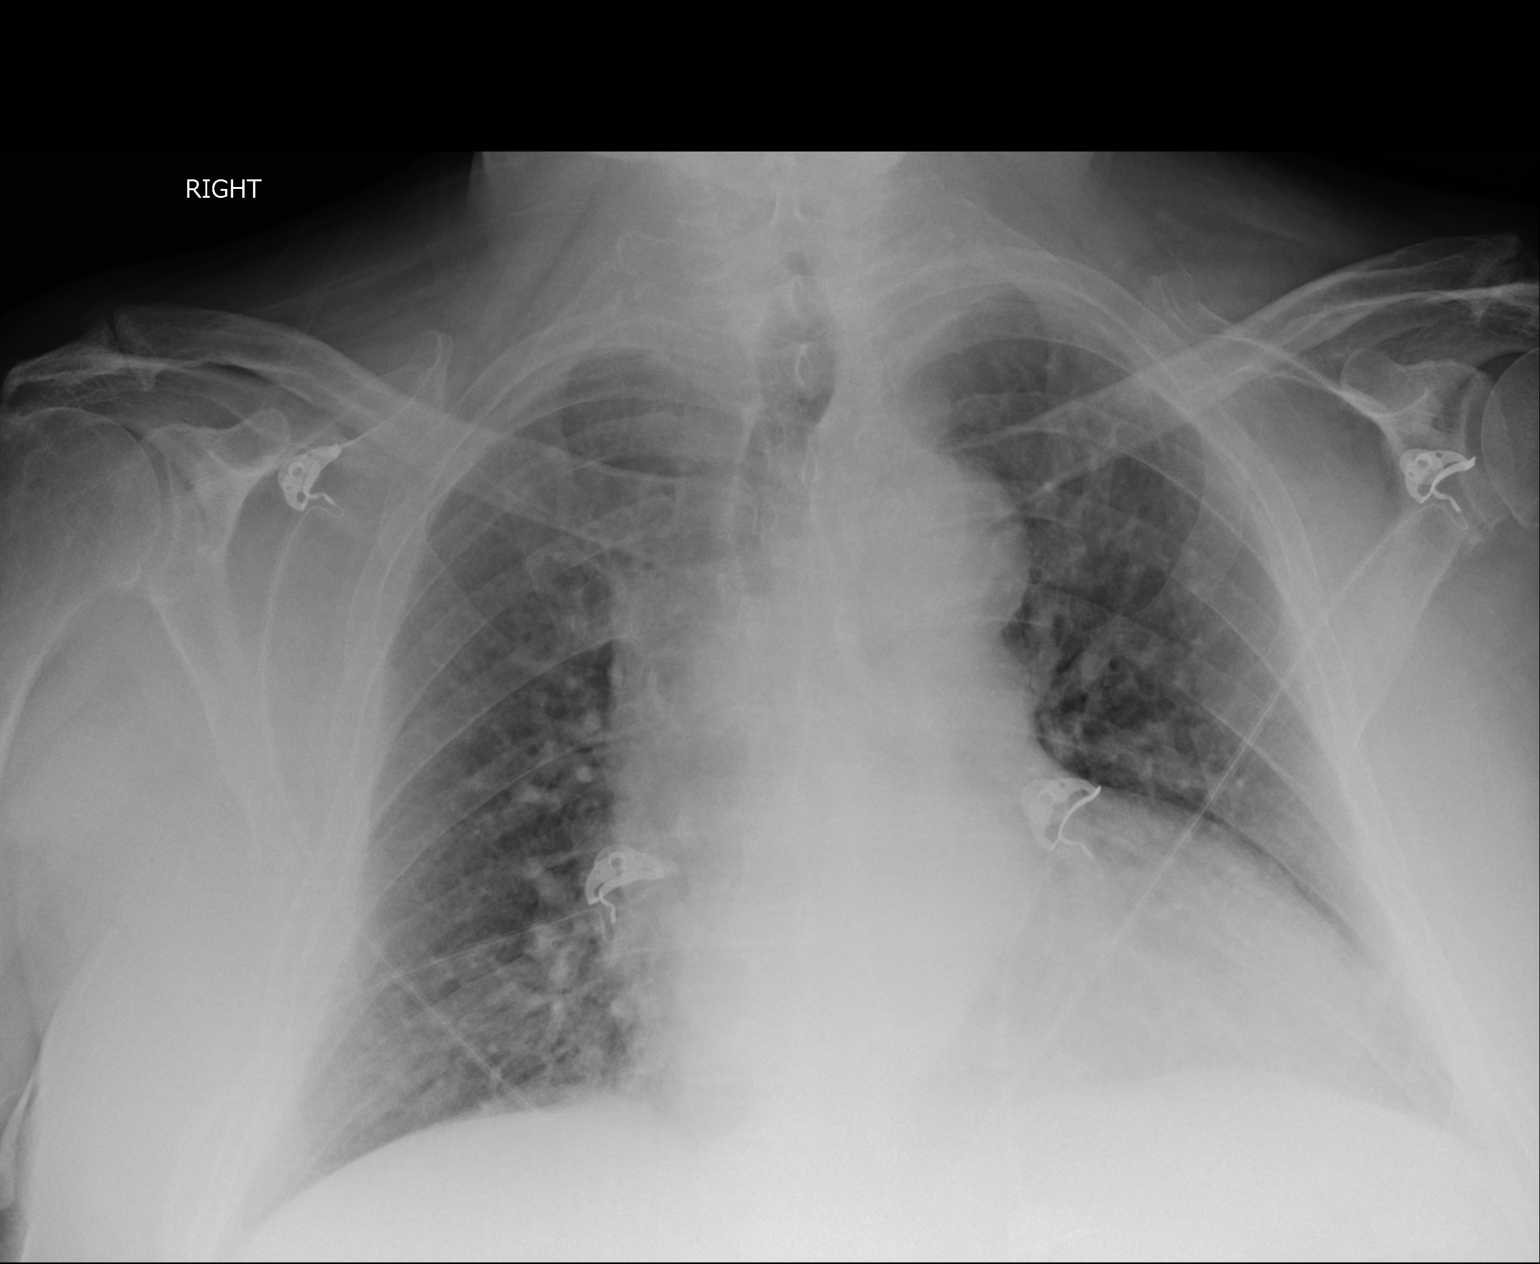

[1 of 1 positions shown; findings below may reference images not displayed]

FINDINGS: Enlarged cardiomediastinal silhouette, likely augmented by portable
technique. No focal opacity or pleural effusion. No pneumothorax.
IMPRESSION: No active disease.  Cardiomegaly

## 2020-04-23 NOTE — Telephone Encounter (Signed)
Called spoke with [facility] and let her know our apologies for the confusion and miscommunication from our office. She voiced understanding and feedback that will be presented to staff to improve the process to not allow this to happen again. Nothing further needed at this time.

## 2020-04-24 IMAGING — MR MRA HEAD WITHOUT CONTRAST
1 series · 15 of 48 positions shown · non-contrast
Comparison: CT same day

CLINICAL DATA: Dizziness beginning yesterday. Vision loss on the
right.

EXAM:
MRI HEAD WITHOUT CONTRAST
MRA HEAD WITHOUT CONTRAST
TECHNIQUE: Multiplanar, multiecho pulse sequences of the brain and surrounding
structures were obtained without intravenous contrast. Angiographic
images of the head were obtained using MRA technique without
contrast.

[Series 1: TOF fat-sat · axial · 0.8mm · 0.34mm/px · z∈[-81,+18]mm · 15 of 131 slices shown]
[im 1/131]
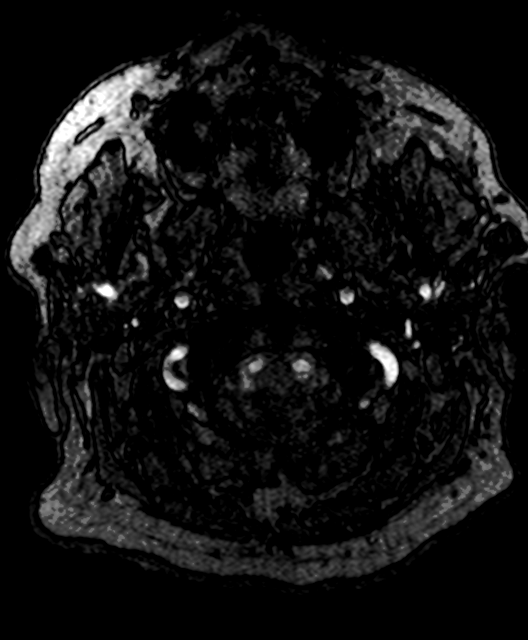
[im 3/131]
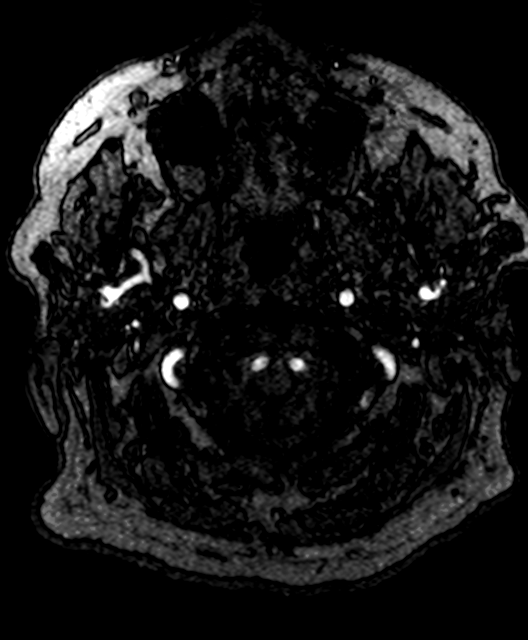
[im 6/131]
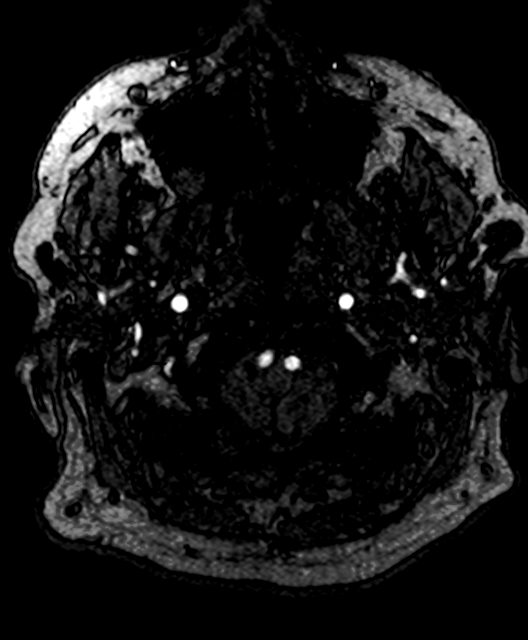
[im 9/131]
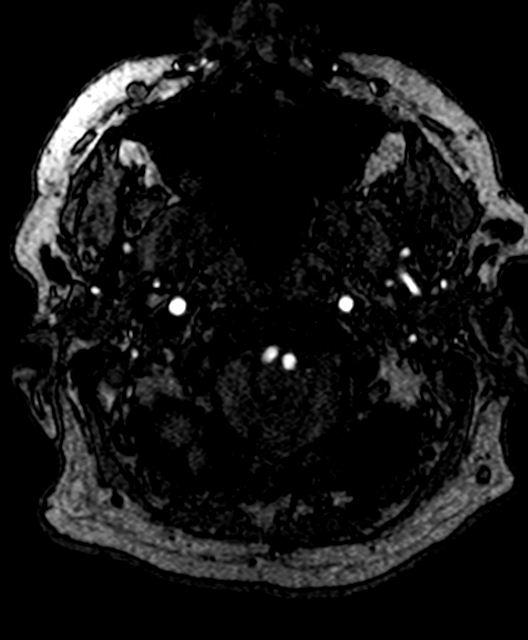
[im 12/131]
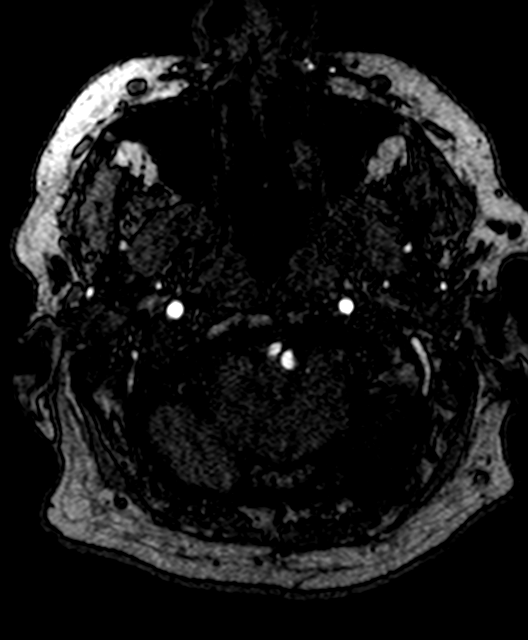
[im 23/131]
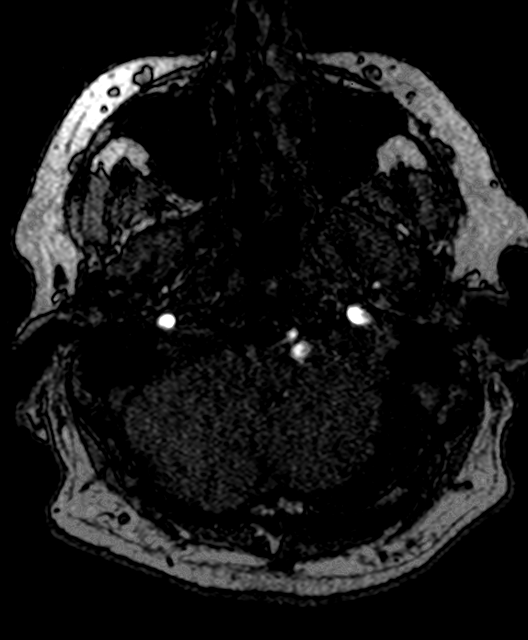
[im 25/131]
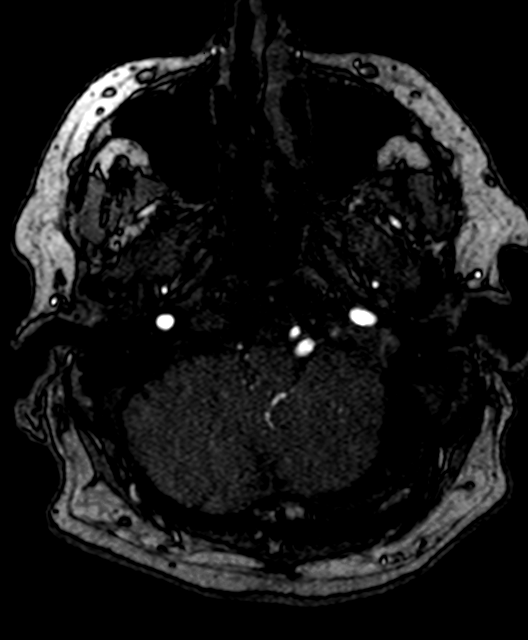
[im 42/131]
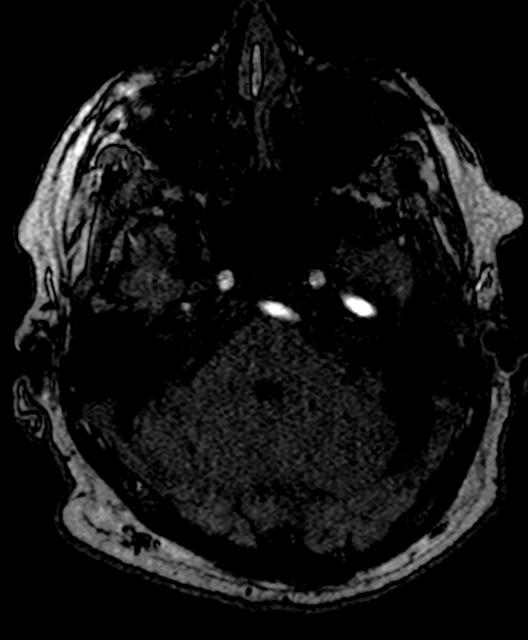
[im 59/131]
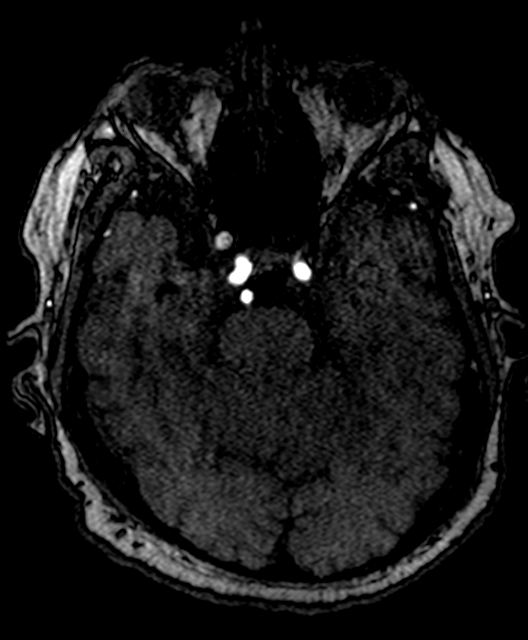
[im 67/131]
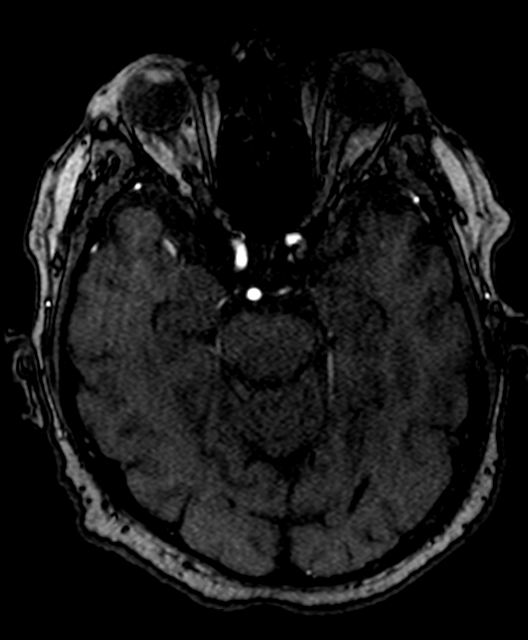
[im 75/131]
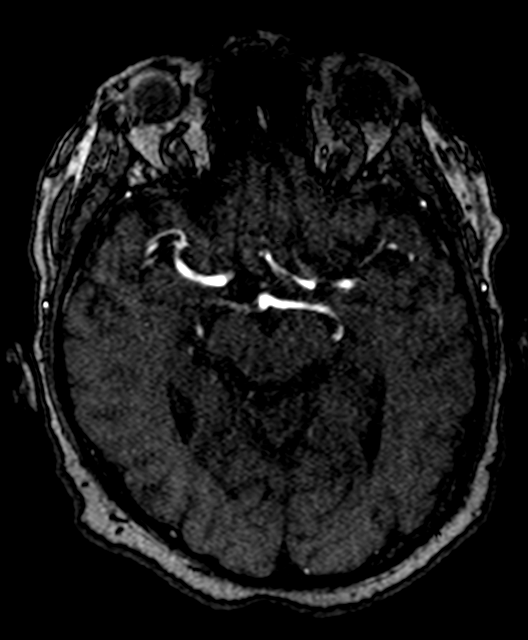
[im 92/131]
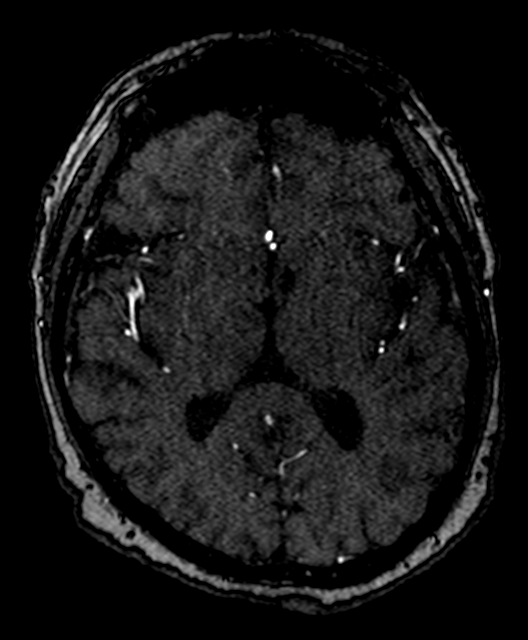
[im 108/131]
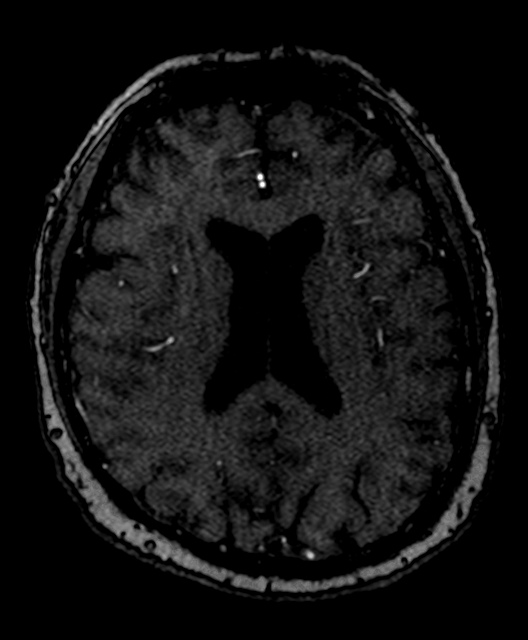
[im 111/131]
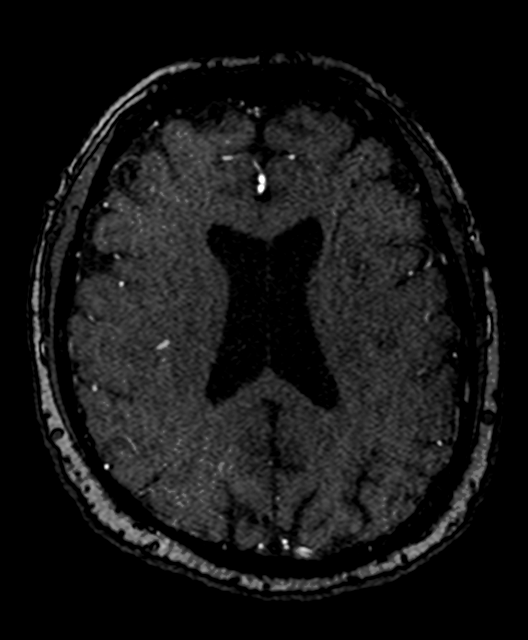
[im 125/131]
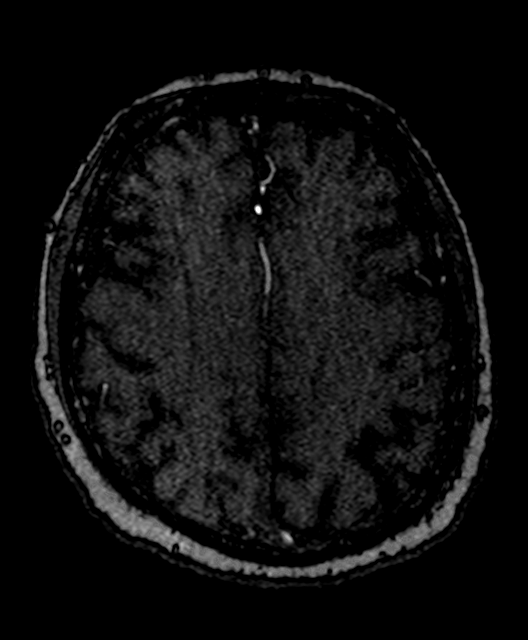

[15 of 48 positions shown; findings below may reference images not displayed]

FINDINGS: MRI HEAD FINDINGS

Brain: Diffusion imaging does not show any acute or subacute
infarction. The brainstem is normal. There is a focus of hemosiderin
deposition in the inferior right cerebellum that could be associated
with an old small vessel infarction or a tiny cavernoma. Cerebral
hemispheres show mild volume loss but without old or recent small or
large vessel infarction. No mass lesion, hemorrhage, hydrocephalus
or subdural hematoma or hygroma. There is probably an arachnoid cyst
along the left lateral margin of the cerebellum, not likely
clinically significant. There may be a small arachnoid cyst at the
anterior middle cranial fossa on the right as well.

Vascular: Major vessels at the base of the brain show flow.

Skull and upper cervical spine: Negative

Sinuses/Orbits: Clear/normal

Other: None

MRA HEAD FINDINGS

Both internal carotid arteries are patent through the skull base. No
siphon stenosis. The anterior and middle cerebral vessels are patent
without proximal stenosis, aneurysm or vascular malformation.

Both vertebral arteries are patent to the basilar. In the V4 segment
on the left, there is a question of a filling defect. This could be
artifactual or could indicate a localized vertebral dissection. I
would suggest CT angiography to evaluate this segment.
IMPRESSION: Normal appearance of the brain for a person of this age. No acute
infarction. Punctate focus of hemosiderin in the inferior cerebellum
on the right that could be a tiny cavernoma or relate to an old
small vessel insult no longer specifically visible. Brain does not
show a pattern of widespread small-vessel disease.

No large or medium vessel occlusion. Question abnormal V4 segment of
the left vertebral artery. Localized dissection not excluded. I
would suggest CT angiography to evaluate that.

## 2020-04-24 IMAGING — MR MRI HEAD WITHOUT CONTRAST
8 of 10 series · 33 of 48 positions shown · non-contrast
Comparison: CT same day

CLINICAL DATA: Dizziness beginning yesterday. Vision loss on the
right.

EXAM:
MRI HEAD WITHOUT CONTRAST
MRA HEAD WITHOUT CONTRAST
TECHNIQUE: Multiplanar, multiecho pulse sequences of the brain and surrounding
structures were obtained without intravenous contrast. Angiographic
images of the head were obtained using MRA technique without
contrast.

[Series 2: DWI · axial · 3.0mm · 0.71mm/px · z∈[-88,+72]mm · 7 of 55 slices shown (1 of 2)]
[im 1/55]
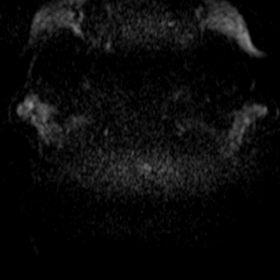
[im 10/55]
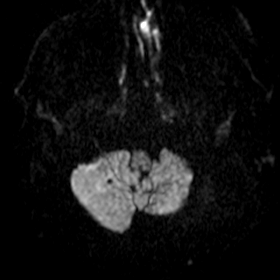
[im 19/55]
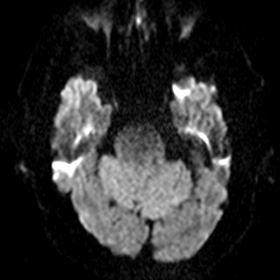
[im 28/55]
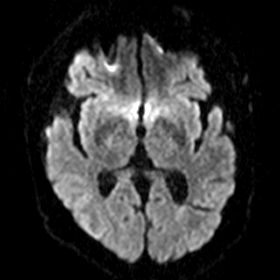
[im 37/55]
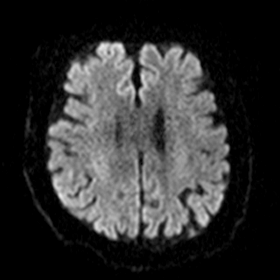
[im 46/55]
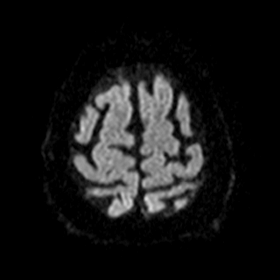
[im 55/55]
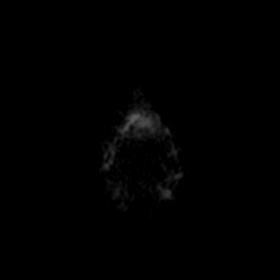

[Series 4: DWI · coronal · 5.0mm · 0.47mm/px · 4 of 36 slices shown (2 of 2)]
[im 1/36]
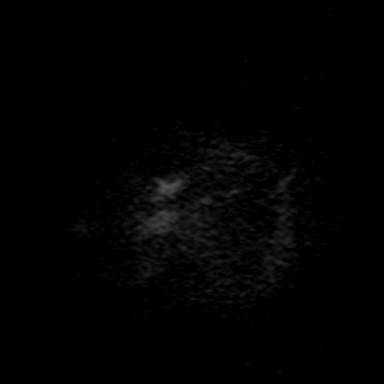
[im 12/36]
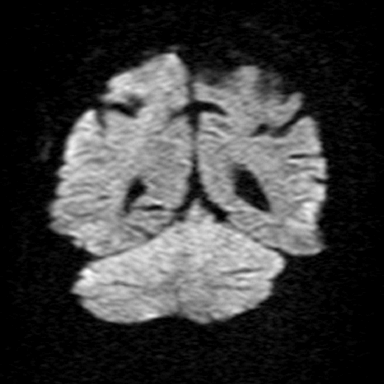
[im 24/36]
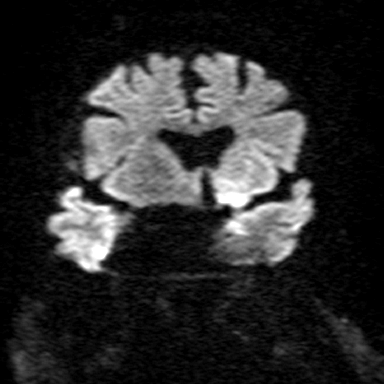
[im 36/36]
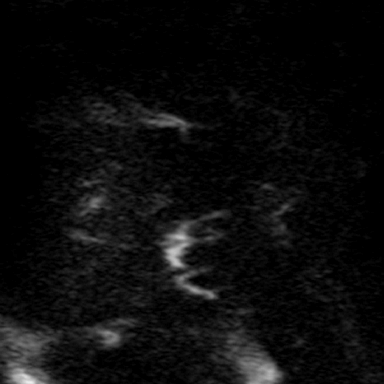

[Series 6: T1 · sagittal · 5.0mm · 0.42mm/px · 3 of 24 slices shown (1 of 2)]
[im 1/24]
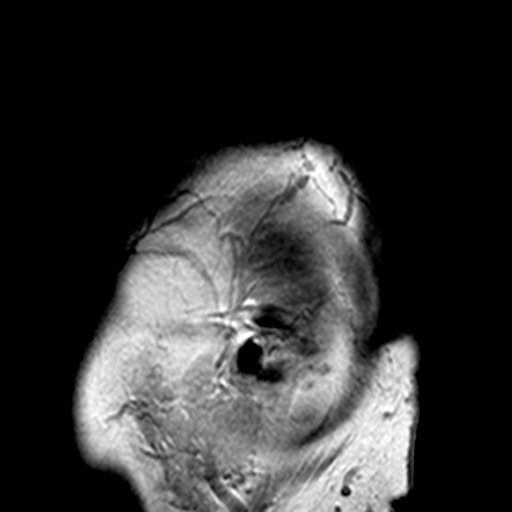
[im 12/24]
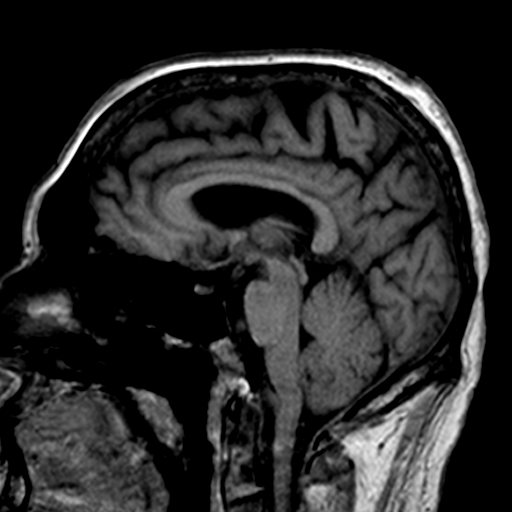
[im 24/24]
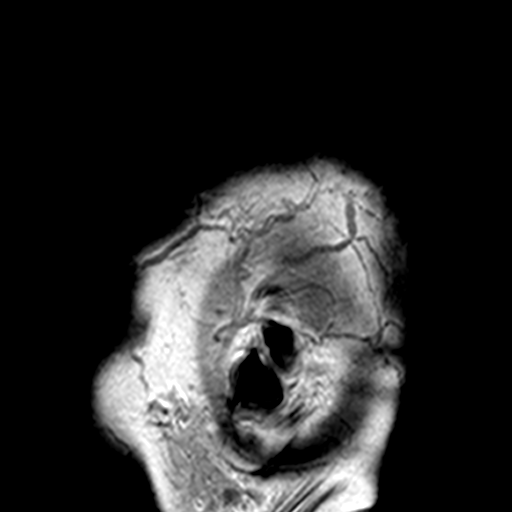

[Series 7: T2 · axial · 5.0mm · 0.68mm/px · z∈[-84,+71]mm · 3 of 25 slices shown (1 of 3)]
[im 1/25]
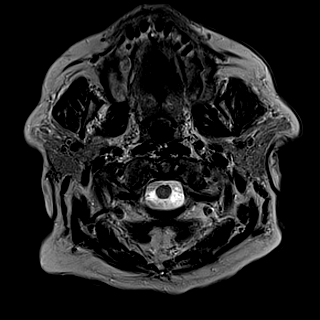
[im 13/25]
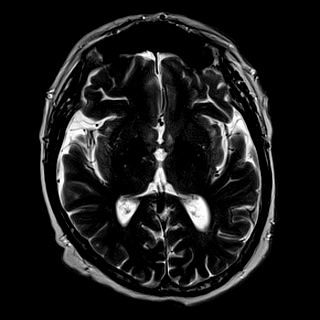
[im 25/25]
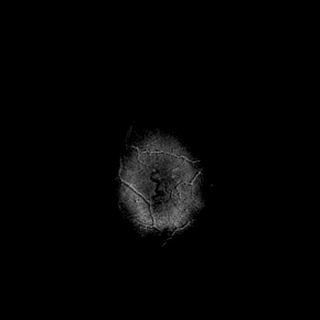

[Series 8: T2 · axial · 5.0mm · 0.43mm/px · z∈[-83,+71]mm · 3 of 25 slices shown (2 of 3)]
[im 1/25]
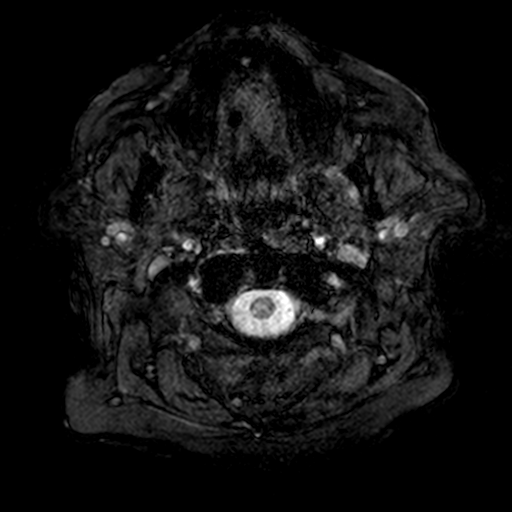
[im 13/25]
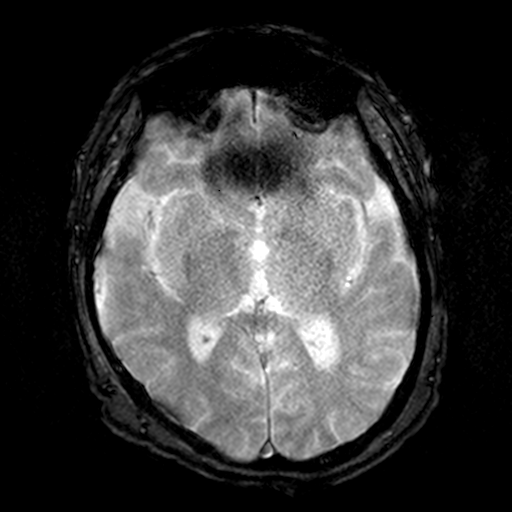
[im 25/25]
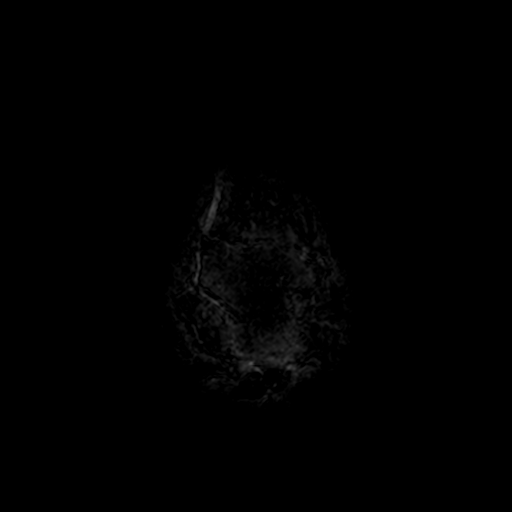

[Series 9: FLAIR · axial · 3.0mm · 0.85mm/px · z∈[-79,+66]mm · 6 of 50 slices shown]
[im 1/50]
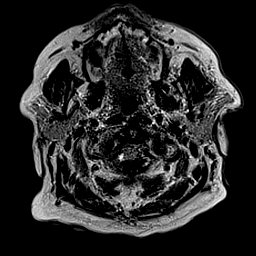
[im 10/50]
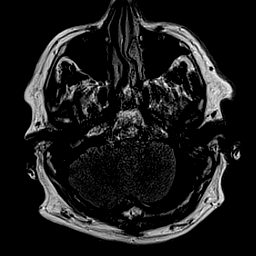
[im 20/50]
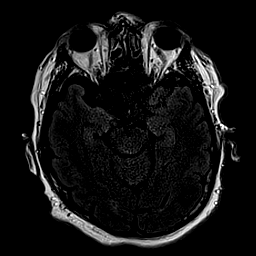
[im 30/50]
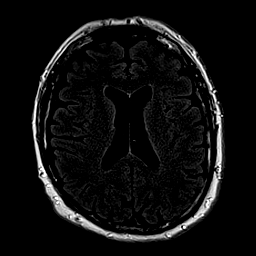
[im 40/50]
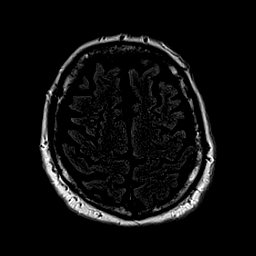
[im 50/50]
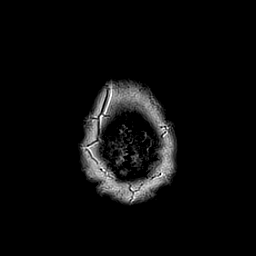

[Series 10: T1 · axial · 2.0mm · 0.43mm/px · z∈[-82,-25]mm · 4 of 79 slices shown (2 of 2)]
[im 1/79]
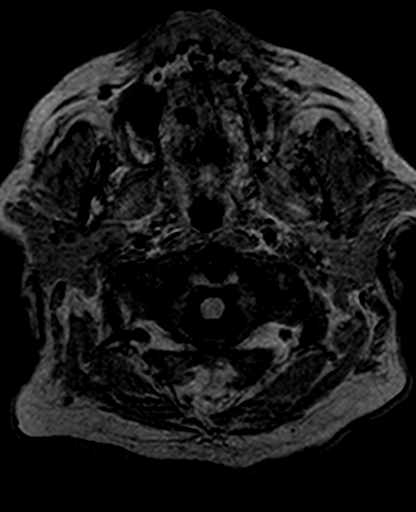
[im 10/79]
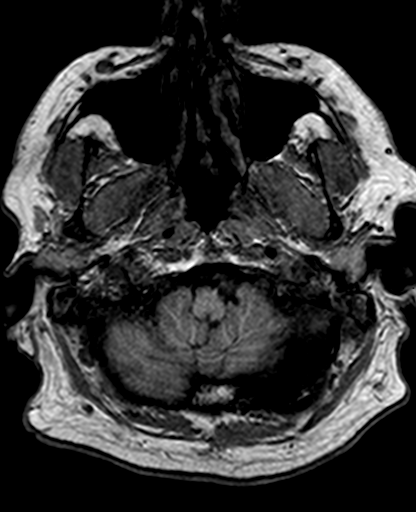
[im 20/79]
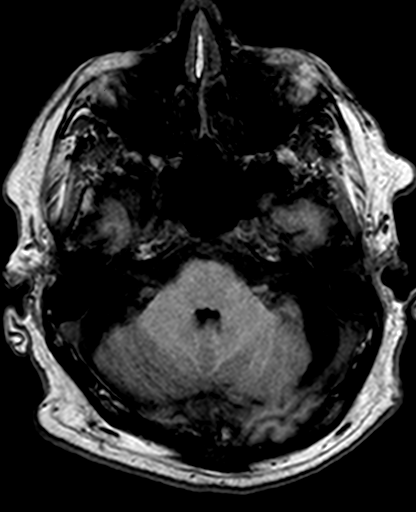
[im 30/79]
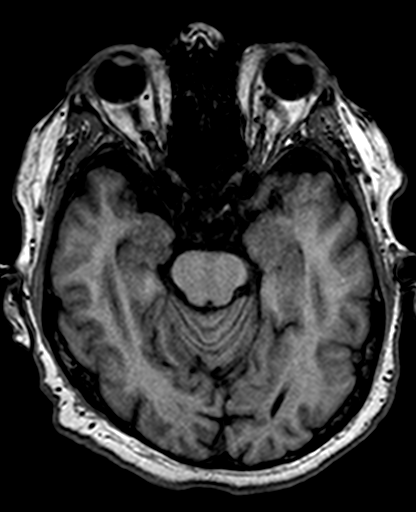

[Series 11: T2 · coronal · 5.0mm · 0.61mm/px · 3 of 28 slices shown (3 of 3)]
[im 1/28]
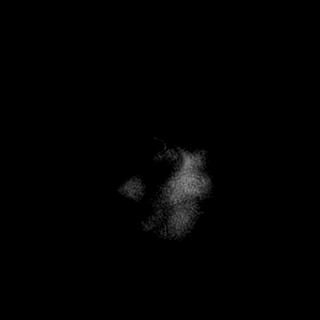
[im 14/28]
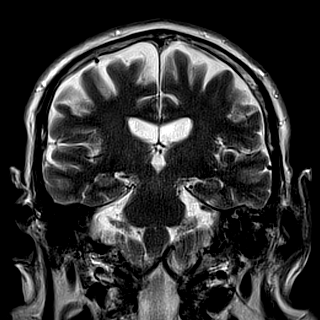
[im 28/28]
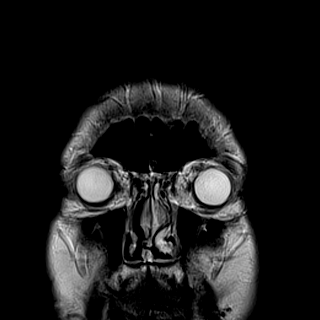

[33 of 48 positions shown; findings below may reference images not displayed]

FINDINGS: MRI HEAD FINDINGS

Brain: Diffusion imaging does not show any acute or subacute
infarction. The brainstem is normal. There is a focus of hemosiderin
deposition in the inferior right cerebellum that could be associated
with an old small vessel infarction or a tiny cavernoma. Cerebral
hemispheres show mild volume loss but without old or recent small or
large vessel infarction. No mass lesion, hemorrhage, hydrocephalus
or subdural hematoma or hygroma. There is probably an arachnoid cyst
along the left lateral margin of the cerebellum, not likely
clinically significant. There may be a small arachnoid cyst at the
anterior middle cranial fossa on the right as well.

Vascular: Major vessels at the base of the brain show flow.

Skull and upper cervical spine: Negative

Sinuses/Orbits: Clear/normal

Other: None

MRA HEAD FINDINGS

Both internal carotid arteries are patent through the skull base. No
siphon stenosis. The anterior and middle cerebral vessels are patent
without proximal stenosis, aneurysm or vascular malformation.

Both vertebral arteries are patent to the basilar. In the V4 segment
on the left, there is a question of a filling defect. This could be
artifactual or could indicate a localized vertebral dissection. I
would suggest CT angiography to evaluate this segment.
IMPRESSION: Normal appearance of the brain for a person of this age. No acute
infarction. Punctate focus of hemosiderin in the inferior cerebellum
on the right that could be a tiny cavernoma or relate to an old
small vessel insult no longer specifically visible. Brain does not
show a pattern of widespread small-vessel disease.

No large or medium vessel occlusion. Question abnormal V4 segment of
the left vertebral artery. Localized dissection not excluded. I
would suggest CT angiography to evaluate that.

## 2020-05-11 ENCOUNTER — Other Ambulatory Visit (INDEPENDENT_AMBULATORY_CARE_PROVIDER_SITE_OTHER): Payer: Medicare HMO

## 2020-05-11 ENCOUNTER — Encounter: Payer: Self-pay | Admitting: Neurology

## 2020-05-11 ENCOUNTER — Other Ambulatory Visit: Payer: Self-pay

## 2020-05-11 ENCOUNTER — Ambulatory Visit: Payer: Medicare HMO | Admitting: Neurology

## 2020-05-11 VITALS — BP 115/78 | HR 96 | Ht 72.0 in | Wt 329.0 lb

## 2020-05-11 DIAGNOSIS — R413 Other amnesia: Secondary | ICD-10-CM

## 2020-05-11 DIAGNOSIS — G3184 Mild cognitive impairment, so stated: Secondary | ICD-10-CM | POA: Diagnosis not present

## 2020-05-11 DIAGNOSIS — Z113 Encounter for screening for infections with a predominantly sexual mode of transmission: Secondary | ICD-10-CM | POA: Diagnosis not present

## 2020-05-11 LAB — B12 AND FOLATE PANEL
Folate: 15.3 ng/mL (ref >5.9)
Vitamin B-12: 483 pg/mL (ref 211–911)

## 2020-05-11 LAB — TSH: TSH: 3.53 u[IU]/mL (ref 0.35–4.50)

## 2020-05-11 NOTE — Patient Instructions (Addendum)
1. Bloodwork for TSH, B12, B1, folate, RPR  2. Schedule MRI brain without contrast  3. Schedule Neurocognitive testing  4. Follow-up in 3 months, call for any changes    RECOMMENDATIONS FOR ALL PATIENTS WITH MEMORY PROBLEMS: 1. Continue to exercise (Recommend 30 minutes of walking everyday, or 3 hours every week) 2. Increase social interactions - continue going to [place] and enjoy social gatherings with friends and family 3. Eat healthy, avoid fried foods and eat more fruits and vegetables 4. Maintain adequate blood pressure, blood sugar, and blood cholesterol level. Reducing the risk of stroke and cardiovascular disease also helps promoting better memory. 5. Avoid stressful situations. Live a simple life and avoid aggravations. Organize your time and prepare for the next day in anticipation. 6. Sleep well, avoid any interruptions of sleep and avoid any distractions in the bedroom that may interfere with adequate sleep quality 7. Avoid sugar, avoid sweets as there is a strong link between excessive sugar intake, diabetes, and cognitive impairment We discussed the Mediterranean diet, which has been shown to help patients reduce the risk of progressive memory disorders and reduces cardiovascular risk. This includes eating fish, eat fruits and green leafy vegetables, nuts like almonds and hazelnuts, walnuts, and also use olive oil. Avoid fast foods and fried foods as much as possible. Avoid sweets and sugar as sugar use has been linked to worsening of memory function.

## 2020-05-11 NOTE — Progress Notes (Signed)
NEUROLOGY CONSULTATION NOTE  Raymond Hurst MRN: [id number] DOB: [birth year]  Referring provider: Carolee Rota, NP Primary care provider: [facility] at [facility]  Reason for consult:  Memory loss   Thank you for your kind referral of Raymond Hurst for consultation of the above symptoms. Although his history is well known to you, please allow me to reiterate it for the purpose of our medical record. He is alone in the office today. Records and images were personally reviewed where available.   HISTORY OF PRESENT ILLNESS: This is a 70 year old right-handed man with a history of hypertension, atrial fibrillation, obesity, presenting for evaluation of memory loss. He is alone in the office today. His wife called earlier to express concerns about the possibility of alcohol dementia or dementia of any sort, reporting he would forget to turn off the stove on multiple occasions. She also reported he talks to himself more and more nowadays. PCP notes from January 2022 indicate worsening memory loss. His wife reported that he had been drinking while taking Eliquis and that he had increased alcohol intake, worried this might be causing the memory loss. Memory loss had been progressing for a year, inability to recall recent conversations or events, how to drive to familiar places, interfering with personal hygiene. He drinks 3-4 glasses of scotch and 3-4 beers every night. He stays up until 1 or 2 am.  He states his memory used to be better.He lives with his wife. He reports getting lost driving only one time a couple of months ago, he was trying to remember how to get to a certain gas station and went past it and turned around. He manages his own medications and misses a dose infrequently. His wife manages finances. He admits to leaving the stove on 2-3 months ago, he thinks he was in a hurry so he left the burner on. He denies misplacing things frequently. He denies any word-finding  difficulties.  He reports drinking 2-3 beers at night and a couple of shots of whiskey. He states this has been on a steady consistent basis for the past 40 years. No family history of dementia, no history of significant head injuries.  He has noticed left hand numbness affecting all fingers when playing the guitar. He would shake his hand and symptoms resolve. No pain. He has numbness localized to the medial side of his right second digit, other fingers are unaffected. Feet are unaffected. He has noticed a little more urinary incontinence recently. He has some back soreness, left knee and ankle pain. No headaches, dizziness, diplopia, dysarthria/dysphagia, neck pain, bowel dysfunction,anosmia, or tremors. He does not sleep well, getting around 4 hours of sleep with occasional daytime drowsiness. He has been told her snores. No falls. He reports mood is generally optimistic, he states he does not get upset and has an "even character."   Laboratory Data: January 2022: AST and ALT normal  PAST MEDICAL HISTORY: Past Medical History:  Diagnosis Date  . Atrial fibrillation ([place])   . Cellulitis of right lower leg   . History of TIA (transient ischemic attack)    May 2020  . Lymphedema     PAST SURGICAL HISTORY: Past Surgical History:  Procedure Laterality Date  . INNER EAR SURGERY    . ORTHOPEDIC SURGERY      MEDICATIONS: Current Outpatient Medications on File Prior to Visit  Medication Sig Dispense Refill  . ELIQUIS 5 MG TABS tablet TAKE 1 TABLET BY MOUTH TWICE  A DAY 60 tablet 6  . ibuprofen (ADVIL) 200 MG tablet Take 200 mg by mouth every 6 (six) hours as needed.    . metoprolol tartrate (LOPRESSOR) 25 MG tablet Take 1 tablet (25 mg total) by mouth 2 (two) times daily. 60 tablet 6   No current facility-administered medications on file prior to visit.    ALLERGIES: No Known Allergies  FAMILY HISTORY: Family History  Problem Relation Age of Onset  . Heart failure Mother   . COPD  Mother   . Atrial fibrillation Mother   . Idiopathic pulmonary fibrosis Father     SOCIAL HISTORY: Social History   Socioeconomic History  . Marital status: Married    Spouse name: Not on file  . Number of children: Not on file  . Years of education: Not on file  . Highest education level: Not on file  Occupational History  . Not on file  Tobacco Use  . Smoking status: Former Smoker    Packs/day: 2.00    Years: 20.00    Pack years: 40.00    Types: Cigarettes    Quit date: 01/31/1990    Years since quitting: 30.2  . Smokeless tobacco: Never Used  Vaping Use  . Vaping Use: Never used  Substance and Sexual Activity  . Alcohol use: Yes    Comment: 4-5 beer daily  . Drug use: No  . Sexual activity: Not on file  Other Topics Concern  . Not on file  Social History Narrative   Right handed.    Social Determinants of Health   Financial Resource Strain: Not on file  Food Insecurity: Not on file  Transportation Needs: Not on file  Physical Activity: Not on file  Stress: Not on file  Social Connections: Not on file  Intimate Partner Violence: Not on file     PHYSICAL EXAM: Vitals:   05/11/20 0914  BP: 115/78  Pulse: 96  SpO2: 96%   General: No acute distress Head:  Normocephalic/atraumatic Skin/Extremities: No rash, no edema Neurological Exam: Mental status: alert and oriented to person, place, and time, no dysarthria or aphasia, Fund of knowledge is appropriate.  Recent and remote memory are intact.  Attention and concentration are normal.    Able to name objects and repeat phrases.  Montreal Cognitive Assessment  05/11/2020  Visuospatial/ Executive (0/5) 5  Naming (0/3) 3  Attention: Read list of digits (0/2) 2  Attention: Read list of letters (0/1) 1  Attention: Serial 7 subtraction starting at 100 (0/3) 3  Language: Repeat phrase (0/2) 2  Language : Fluency (0/1) 1  Abstraction (0/2) 2  Delayed Recall (0/5) 4  Orientation (0/6) 6  Total 29    Cranial  nerves: CN I: not tested CN II: pupils equal, round and reactive to light, visual fields intact CN III, IV, VI:  full range of motion, no nystagmus, no ptosis CN V: facial sensation intact CN VII: upper and lower face symmetric CN VIII: hearing intact to conversation CN XI: sternocleidomastoid and trapezius muscles intact CN XII: tongue midline Bulk & Tone: normal, no fasciculations. Motor: 5/5 throughout with no pronator drift. Sensation: intact to light touch, cold, pin except for decreased cold and pin on the medial right 2nd digit. Intact vibration sense.  No extinction to double simultaneous stimulation.  Romberg test negative Deep Tendon Reflexes: +2 throughout Cerebellar: no incoordination on finger to nose testing Gait: slow and cautious, wide-based reporting left knee and right ankle pain.  Tremor: none +Tinel sign  at the elbow with tingling around the left elbow   IMPRESSION: This is a 69 year old right-handed man with a history of hypertension, atrial fibrillation, obesity, presenting for evaluation of memory loss. His neurological exam is non-focal, MOCA score today normal 29/30. He is alone in the office today with no family to corroborate history, his wife left a message about concerns, including concern about alcohol causing cognitive issues. MRI brain brain without contrast and Neuropsychological evaluation will be done to further evaluate cognitive concerns. Check TSH, B12, B1, folate, RPR. We discussed effects of alcohol on cognition, as well as the importance of control of vascular risk factors, physical exercise, and brain stimulation exercises for brain health. Follow-up after testing, call for any changes.    Thank you for allowing me to participate in the care of this patient. Please do not hesitate to call for any questions or concerns.   Ellouise Newer, M.D.  CC: Carolee Rota, NP

## 2020-05-12 ENCOUNTER — Other Ambulatory Visit: Payer: Self-pay | Admitting: Cardiology

## 2020-05-15 LAB — VITAMIN B1: Vitamin B1 (Thiamine): 6 nmol/L — ABNORMAL LOW (ref 8–30)

## 2020-05-15 LAB — RPR: RPR Ser Ql: NONREACTIVE

## 2020-05-18 ENCOUNTER — Telehealth: Payer: Self-pay

## 2020-05-18 NOTE — Telephone Encounter (Signed)
Pt called and informed that the thiamine (B1) level is low, recommend starting Thiamine 100mg  daily. Pt stated that he will get it over the counter

## 2020-05-18 NOTE — Telephone Encounter (Signed)
-----   Message from Cameron Sprang, MD sent at 05/16/2020  3:16 PM EDT ----- Pls let him know the thiamine (B1) level is low, recommend starting Thiamine 100mg  daily. Can get over the counter or if he wants prescription, pls send Rx, thanks

## 2020-06-04 ENCOUNTER — Ambulatory Visit
Admission: RE | Admit: 2020-06-04 | Discharge: 2020-06-04 | Disposition: A | Discharge status: Home / Self Care | Payer: Medicare HMO | Source: Ambulatory Visit | Attending: Neurology | Admitting: Neurology

## 2020-06-04 ENCOUNTER — Other Ambulatory Visit: Payer: Self-pay

## 2020-06-04 DIAGNOSIS — J3489 Other specified disorders of nose and nasal sinuses: Secondary | ICD-10-CM | POA: Diagnosis not present

## 2020-06-04 DIAGNOSIS — R413 Other amnesia: Secondary | ICD-10-CM

## 2020-06-04 DIAGNOSIS — Q048 Other specified congenital malformations of brain: Secondary | ICD-10-CM | POA: Diagnosis not present

## 2020-06-04 DIAGNOSIS — J341 Cyst and mucocele of nose and nasal sinus: Secondary | ICD-10-CM | POA: Diagnosis not present

## 2020-06-08 ENCOUNTER — Telehealth: Payer: Self-pay | Admitting: Neurology

## 2020-06-08 NOTE — Telephone Encounter (Signed)
Patient's wife [name] called requesting recent MRI results.

## 2020-06-08 NOTE — Telephone Encounter (Signed)
Pls let wife know that I reviewed brain MRI, no evidence of tumor, stroke, or new changes from his brain scan in 2020. Pls let her know that the MRI does not diagnose dementia, it checks for other things that can also cause memory issues such as a stroke. Alcohol is still a possibility for his memory loss, but the Neurocognitive evaluation scheduled in July will be helpful to further determine this. They can ask to be on cancellation list for earlier testing if they want, thanks

## 2020-06-10 NOTE — Telephone Encounter (Signed)
Spoke with pt wife informed her that Dr Delice Lesch reviewed brain MRI, no evidence of tumor, stroke, or new changes from his brain scan in 2020. Also informed her  that the MRI does not diagnose dementia, it checks for other things that can also cause memory issues such as a stroke. Alcohol is still a possibility for his memory loss, but the Neurocognitive evaluation scheduled in July will be helpful to further determine this. Advised that they can be on cancellation list for earlier testing if they want.

## 2020-07-21 ENCOUNTER — Other Ambulatory Visit: Payer: Self-pay

## 2020-07-21 ENCOUNTER — Ambulatory Visit
Admission: EM | Admit: 2020-07-21 | Discharge: 2020-07-21 | Disposition: A | Discharge status: Home / Self Care | Payer: Medicare HMO | Attending: Family Medicine | Admitting: Family Medicine

## 2020-07-21 ENCOUNTER — Encounter: Payer: Self-pay | Admitting: Emergency Medicine

## 2020-07-21 DIAGNOSIS — I4819 Other persistent atrial fibrillation: Secondary | ICD-10-CM

## 2020-07-21 DIAGNOSIS — L03115 Cellulitis of right lower limb: Secondary | ICD-10-CM | POA: Diagnosis not present

## 2020-07-21 DIAGNOSIS — L03116 Cellulitis of left lower limb: Secondary | ICD-10-CM

## 2020-07-21 DIAGNOSIS — Z7901 Long term (current) use of anticoagulants: Secondary | ICD-10-CM

## 2020-07-21 MED ORDER — DOXYCYCLINE HYCLATE 100 MG PO CAPS: 100.0000 mg | ORAL_CAPSULE | Freq: Two times a day (BID) | ORAL | 0 refills | Status: DC

## 2020-07-21 MED ORDER — SILVER SULFADIAZINE 1 % EX CREA: 1.0000 "application " | TOPICAL_CREAM | Freq: Every day | CUTANEOUS | 0 refills | Status: DC

## 2020-07-21 MED ORDER — FUROSEMIDE 40 MG PO TABS: 40.0000 mg | ORAL_TABLET | Freq: Every day | ORAL | 0 refills | Status: DC

## 2020-07-21 NOTE — Discharge Instructions (Addendum)
Start doxycycline 100 mg twice daily and take for 10 days.  I am also putting you on a fluid pill furosemide start tomorrow in the morning take 40 mg once daily in the morning for the next 5 days.  Avoid taking after 3 PM as this can cause you to have frequent urination throughout the night and disturb your sleep.  Change dressings and apply Silvadene cream to the wounds only once daily with dressing changes.  Keep follow-up with your primary care doctor on Monday.

## 2020-07-21 NOTE — ED Provider Notes (Addendum)
[facility]    CSN: [id number] Arrival date & time: 07/21/20  1608      History   Chief Complaint Chief Complaint  Patient presents with   Foot Pain    HPI Raymond Hurst is a 70 y.o. male.   HPI  Patient with a known history of atrial fibrillation lymphedema and cellulitis involving the right lower extremity presents today with bilateral lower extremity redness, pitting edema and leg pain.  Patient reports his legs have been in this condition for approximately 3 weeks.  Patient reports being compliant with his current medications.  He is currently not on a diuretic.  For lymphedema he wears compression stockings on a daily basis and reports he is compliant to wearing compression stockings daily.  Denies any shortness of breath or weakness.  He is also anticoagulated with Eliquis and reports compliance. Past Medical History:  Diagnosis Date   Atrial fibrillation ([place])    Cellulitis of right lower leg    History of TIA (transient ischemic attack)    May 2020   Lymphedema     Patient Active Problem List   Diagnosis Date Noted   DOE (dyspnea on exertion) 03/25/2020   Encounter for removal of sutures 12/24/2019   Dizziness 06/08/2018   TIA (transient ischemic attack) 06/08/2018   Unspecified atrial fibrillation ([place]) 06/08/2018   Near syncope 06/07/2018   Hypokalemia 06/07/2018   Lymphedema of leg 04/08/2013   Cellulitis of right lower leg 04/04/2013   Morbid obesity due to excess calories ([place]) 04/04/2013   Alcohol abuse 04/04/2013   Cellulitis and abscess of leg 04/04/2013    Past Surgical History:  Procedure Laterality Date   INNER EAR SURGERY     ORTHOPEDIC SURGERY         Home Medications    Prior to Admission medications   Medication Sig Start Date End Date Taking? Authorizing Provider  doxycycline (VIBRAMYCIN) 100 MG capsule Take 1 capsule (100 mg total) by mouth 2 (two) times daily. 07/21/20  Yes Scot Jun, FNP  furosemide (LASIX)  40 MG tablet Take 1 tablet (40 mg total) by mouth daily. 07/21/20  Yes Scot Jun, FNP  silver sulfADIAZINE (SILVADENE) 1 % cream Apply 1 application topically daily. Clean site of wound and apply cream. 07/21/20  Yes Scot Jun, FNP  ELIQUIS 5 MG TABS tablet TAKE 1 TABLET BY MOUTH TWICE A DAY 03/20/20   Satira Sark, MD  ibuprofen (ADVIL) 200 MG tablet Take 200 mg by mouth every 6 (six) hours as needed.    [provider]  metoprolol tartrate (LOPRESSOR) 25 MG tablet TAKE 1 TABLET BY MOUTH TWICE A DAY 05/12/20   Satira Sark, MD    Family History Family History  Problem Relation Age of Onset   Heart failure Mother    COPD Mother    Atrial fibrillation Mother    Idiopathic pulmonary fibrosis Father     Social History Social History   Tobacco Use   Smoking status: Former    Packs/day: 2.00    Years: 20.00    Pack years: 40.00    Types: Cigarettes    Quit date: 01/31/1990    Years since quitting: 30.4   Smokeless tobacco: Never  Vaping Use   Vaping Use: Never used  Substance Use Topics   Alcohol use: Yes    Comment: 4-5 beer daily   Drug use: No     Allergies   Patient has no known allergies.  Review of Systems Review of Systems Pertinent negatives listed in HPI  Physical Exam Triage Vital Signs ED Triage Vitals [07/21/20 1701]  Enc Vitals Group     BP 134/87     Pulse Rate (!) 102     Resp 16     Temp 98.6 F (37 C)     Temp Source Oral     SpO2 96 %     Weight      Height      Head Circumference      Peak Flow      Pain Score      Pain Loc      Pain Edu?      Excl. in [place]?    No data found.  Updated Vital Signs BP 134/87 (BP Location: Right Arm)   Pulse (!) 102   Temp 98.4 F (36.9 C) (Oral)   Resp 16   SpO2 96%   Visual Acuity Right Eye Distance:   Left Eye Distance:   Bilateral Distance:    Right Eye Near:   Left Eye Near:    Bilateral Near:     Physical Exam Constitutional:      Comments:  Chronically ill appearing. No active distress.  Cardiovascular:     Rate and Rhythm: Normal rate. Rhythm regularly irregular.     Comments: Diffuse erythema with a open wound right medial lateral lower extremity.  5 mm in diameter wound in the mid left leg.   Pulmonary:     Effort: Pulmonary effort is normal.     Breath sounds: Decreased breath sounds present.  Musculoskeletal:     Right lower leg: 4+ Pitting Edema present.     Left lower leg: 4+ Pitting Edema present.  Skin:    Capillary Refill: Capillary refill takes less than 2 seconds.  Neurological:     General: No focal deficit present.  Psychiatric:        Mood and Affect: Mood normal.        Behavior: Behavior normal.        Thought Content: Thought content normal.        Judgment: Judgment normal.     UC Treatments / Results  Labs (all labs ordered are listed, but only abnormal results are displayed) Labs Reviewed  COMPREHENSIVE METABOLIC PANEL  CBC WITH DIFFERENTIAL/PLATELET    EKG Patient patient with PVCs rate 114 questionable left fascicular block no specific ST elevation consistent with prior EKG with the exception of the PVCs.  Radiology No results found.  Procedures Procedures (including critical care time)  Medications Ordered in UC Medications - No data to display  Initial Impression / Assessment and Plan / UC Course  I have reviewed the triage vital signs and the nursing notes.  Pertinent labs & imaging results that were available during my care of the patient were reviewed by me and considered in my medical decision making (see chart for details).    Bilateral lower extremity cellulitis with diffuse edema Treatment with furosemide and doxycycline.  Patient has a follow-up appointment scheduled with his primary care provider in 5 days.  Patient has 2 significant open wounds on his lower extremities apply Silvadene cream and provided wound care instructions to the patient and his daughter.  Legs  wrapped in a dressing advised to change dressing at least once daily.Continue to monitor for signs of worsening infection.  CBC and CMP pending to check white count along with evaluation of electrolytes, renal functioning and liver function  status.  Advised that we will contact patient with any abnormal labs.  If labs are within normal range we do not call.  ER precautions given.   Final Clinical Impressions(s) / UC Diagnoses   Final diagnoses:  Cellulitis of right lower leg  Cellulitis of leg, left  Persistent atrial fibrillation (HCC)  Anticoagulated     Discharge Instructions      Start doxycycline 100 mg twice daily and take for 10 days.  I am also putting you on a fluid pill furosemide start tomorrow in the morning take 40 mg once daily in the morning for the next 5 days.  Avoid taking after 3 PM as this can cause you to have frequent urination throughout the night and disturb your sleep.  Change dressings and apply Silvadene cream to the wounds only once daily with dressing changes.  Keep follow-up with your primary care doctor on Monday.     ED Prescriptions     Medication Sig Dispense Auth. Provider   doxycycline (VIBRAMYCIN) 100 MG capsule Take 1 capsule (100 mg total) by mouth 2 (two) times daily. 20 capsule Scot Jun, FNP   furosemide (LASIX) 40 MG tablet Take 1 tablet (40 mg total) by mouth daily. 5 tablet Scot Jun, FNP   silver sulfADIAZINE (SILVADENE) 1 % cream Apply 1 application topically daily. Clean site of wound and apply cream. 100 g Scot Jun, FNP      PDMP not reviewed this encounter.   Scot Jun, FNP 07/21/20 1759    Scot Jun, FNP 07/21/20 1800

## 2020-07-22 LAB — COMPREHENSIVE METABOLIC PANEL
ALT: 30 IU/L (ref 0–44)
AST: 22 IU/L (ref 0–40)
Albumin/Globulin Ratio: 1.3 (ref 1.2–2.2)
Albumin: 4 g/dL (ref 3.8–4.8)
Alkaline Phosphatase: 59 IU/L (ref 44–121)
BUN/Creatinine Ratio: 17 (ref 10–24)
BUN: 17 mg/dL (ref 8–27)
Bilirubin Total: 0.5 mg/dL (ref 0.0–1.2)
CO2: 21 mmol/L (ref 20–29)
Calcium: 9.3 mg/dL (ref 8.6–10.2)
Chloride: 107 mmol/L — ABNORMAL HIGH (ref 96–106)
Creatinine, Ser: 0.99 mg/dL (ref 0.76–1.27)
Globulin, Total: 3 g/dL (ref 1.5–4.5)
Glucose: 110 mg/dL — ABNORMAL HIGH (ref 65–99)
Potassium: 4.1 mmol/L (ref 3.5–5.2)
Sodium: 142 mmol/L (ref 134–144)
Total Protein: 7 g/dL (ref 6.0–8.5)
eGFR: 83 mL/min/{1.73_m2} (ref >59)

## 2020-07-22 LAB — CBC WITH DIFFERENTIAL/PLATELET
Basophils Absolute: 0.1 10*3/uL (ref 0.0–0.2)
Basos: 1 %
EOS (ABSOLUTE): 0.1 10*3/uL (ref 0.0–0.4)
Eos: 1 %
Hematocrit: 45.5 % (ref 37.5–51.0)
Hemoglobin: 15.7 g/dL (ref 13.0–17.7)
Immature Grans (Abs): 0 10*3/uL (ref 0.0–0.1)
Immature Granulocytes: 0 %
Lymphocytes Absolute: 1.1 10*3/uL (ref 0.7–3.1)
Lymphs: 17 %
MCH: 35.7 pg — ABNORMAL HIGH (ref 26.6–33.0)
MCHC: 34.5 g/dL (ref 31.5–35.7)
MCV: 103 fL — ABNORMAL HIGH (ref 79–97)
Monocytes Absolute: 0.6 10*3/uL (ref 0.1–0.9)
Monocytes: 9 %
Neutrophils Absolute: 4.7 10*3/uL (ref 1.4–7.0)
Neutrophils: 72 %
RBC: 4.4 x10E6/uL (ref 4.14–5.80)
RDW: 13.5 % (ref 11.6–15.4)
WBC: 6.6 10*3/uL (ref 3.4–10.8)

## 2020-07-27 DIAGNOSIS — L03115 Cellulitis of right lower limb: Secondary | ICD-10-CM | POA: Diagnosis not present

## 2020-07-27 DIAGNOSIS — I4891 Unspecified atrial fibrillation: Secondary | ICD-10-CM | POA: Diagnosis not present

## 2020-07-27 DIAGNOSIS — Z7901 Long term (current) use of anticoagulants: Secondary | ICD-10-CM | POA: Diagnosis not present

## 2020-07-27 DIAGNOSIS — I890 Lymphedema, not elsewhere classified: Secondary | ICD-10-CM | POA: Diagnosis not present

## 2020-08-06 DIAGNOSIS — L03115 Cellulitis of right lower limb: Secondary | ICD-10-CM | POA: Diagnosis not present

## 2020-08-07 ENCOUNTER — Encounter: Payer: Medicare HMO | Admitting: Psychology

## 2020-08-12 ENCOUNTER — Other Ambulatory Visit: Payer: Self-pay

## 2020-08-12 ENCOUNTER — Encounter (HOSPITAL_BASED_OUTPATIENT_CLINIC_OR_DEPARTMENT_OTHER): Payer: Medicare HMO | Attending: Physician Assistant | Admitting: Physician Assistant

## 2020-08-12 DIAGNOSIS — I480 Paroxysmal atrial fibrillation: Secondary | ICD-10-CM | POA: Insufficient documentation

## 2020-08-12 DIAGNOSIS — Z87891 Personal history of nicotine dependence: Secondary | ICD-10-CM | POA: Diagnosis not present

## 2020-08-12 DIAGNOSIS — E669 Obesity, unspecified: Secondary | ICD-10-CM | POA: Diagnosis not present

## 2020-08-12 DIAGNOSIS — I890 Lymphedema, not elsewhere classified: Secondary | ICD-10-CM | POA: Diagnosis not present

## 2020-08-12 DIAGNOSIS — Z8249 Family history of ischemic heart disease and other diseases of the circulatory system: Secondary | ICD-10-CM | POA: Insufficient documentation

## 2020-08-12 DIAGNOSIS — Z833 Family history of diabetes mellitus: Secondary | ICD-10-CM | POA: Insufficient documentation

## 2020-08-12 DIAGNOSIS — L97812 Non-pressure chronic ulcer of other part of right lower leg with fat layer exposed: Secondary | ICD-10-CM | POA: Diagnosis not present

## 2020-08-12 DIAGNOSIS — Z6841 Body Mass Index (BMI) 40.0 and over, adult: Secondary | ICD-10-CM | POA: Insufficient documentation

## 2020-08-12 DIAGNOSIS — Z7901 Long term (current) use of anticoagulants: Secondary | ICD-10-CM | POA: Diagnosis not present

## 2020-08-12 NOTE — Progress Notes (Signed)
Raymond Hurst, Raymond Hurst ([id number]) Visit Report for 08/12/2020 Chief Complaint Document Details Patient Name: Date of Service: [facility] Hurst, Montague R. 08/12/2020 1:15 PM Medical Record Number: [id number] Patient Account Number: [id number] Date of Birth/Sex: Treating RN: 03/22/1950 (70 y.o. [name] Primary Care Provider: Jenene Slicker Other Clinician: Referring Provider: Treating Provider/Extender: Leo Rod, EA GLE Weeks in Treatment: 0 Information Obtained from: Patient Chief Complaint Right LE Ulcer Electronic Signature(s) Signed: 08/12/2020 1:53:54 PM By: Worthy Keeler PA-C Entered By: Worthy Keeler on 08/12/2020 13:53:53 -------------------------------------------------------------------------------- HPI Details Patient Name: Date of Service: Raymond Hurst, [name] 08/12/2020 1:15 PM Medical Record Number: [id number] Patient Account Number: [id number] Date of Birth/Sex: Treating RN: 05/08/1950 (70 y.o. [name] Primary Care Provider: Jenene Slicker Other Clinician: Referring Provider: Treating Provider/Extender: Leo Rod, EA GLE Weeks in Treatment: 0 History of Present Illness HPI Description: 08/12/2020 upon evaluation today patient appears to be doing actually pretty well" regard to his right lower extremity ulcer. This was actually something that he tells me has been present for about 6 weeks. He was seen at urgent care 2 weeks ago he was given Doxy and Silvadene. Subsequently he has been using this and taking care of this on his own. Looks like he is actually done a pretty good job and in fact with his compression and use of the medication this appears to be healed based on what I see today. He does tell me that he had a venous ablation 18 years ago he does not have lymphedema pumps. He does have a ongoing prescription for Lasix and again he does wear compression stockings he gets them from [facility] and has them on today upon evaluation. He  also has a history of atrial fibrillation for which she is on long-term anticoagulant therapy. He also has a history of lymphedema. Electronic Signature(s) Signed: 08/12/2020 2:11:28 PM By: Worthy Keeler PA-C Entered By: Worthy Keeler on 08/12/2020 14:11:27 -------------------------------------------------------------------------------- Physical Exam Details Patient Name: Date of Service: Raymond Hurst, [name] 08/12/2020 1:15 PM Medical Record Number: [id number] Patient Account Number: [id number] Date of Birth/Sex: Treating RN: [birth year] (70 y.o. [name] Primary Care Provider: Jenene Slicker Other Clinician: Referring Provider: Treating Provider/Extender: Leo Rod, EA GLE Weeks in Treatment: 0 Constitutional sitting or standing blood pressure is within target range for patient.. pulse regular and within target range for patient.Marland Kitchen respirations regular, non-labored and within target range for patient.Marland Kitchen temperature within target range for patient.. Obese and well-hydrated in no acute distress. Eyes conjunctiva clear no eyelid edema noted. pupils equal round and reactive to light and accommodation. Ears, Nose, Mouth, and Throat no gross abnormality of ear auricles or external auditory canals. normal hearing noted during conversation. mucus membranes moist. Respiratory normal breathing without difficulty. Cardiovascular 2+ dorsalis pedis/posterior tibialis pulses. no clubbing, cyanosis, significant edema, <3 sec cap refill. Musculoskeletal normal gait and posture. no significant deformity or arthritic changes, no loss or range of motion, no clubbing. Psychiatric this patient is able to make decisions and demonstrates good insight into disease process. Alert and Oriented x 3. pleasant and cooperative. Notes Upon inspection patient's wound bed actually showed signs of being completely healed. There was a little dry skin that was on the surface of the wound mechanically  debrided this away without the use of a curette and there was actually nothing underlying. Overall I am actually very pleased with where things stand today. There is no signs of  active infection at this time. No fevers, chills, nausea, vomiting, or diarrhea. Electronic Signature(s) Signed: 08/12/2020 2:11:57 PM By: Worthy Keeler PA-C Entered By: Worthy Keeler on 08/12/2020 14:11:57 -------------------------------------------------------------------------------- Physician Orders Details Patient Name: Date of Service: Raymond Hurst, [name] 08/12/2020 1:15 PM Medical Record Number: [id number] Patient Account Number: [id number] Date of Birth/Sex: Treating RN: [birth year] (70 y.o. [name] Primary Care Provider: Anthonette Legato GLE Other Clinician: Referring Provider: Treating Provider/Extender: Leo Rod, EA GLE Weeks in Treatment: 0 Verbal / Phone Orders: No Diagnosis Coding ICD-10 Coding Code Description I89.0 Lymphedema, not elsewhere classified L97.812 Non-pressure chronic ulcer of other part of right lower leg with fat layer exposed I48.0 Paroxysmal atrial fibrillation I10 Essential (primary) hypertension Z79.01 Long term (current) use of anticoagulants Follow-up Appointments Other: - Consult Only: Continue to wear compression stockings and monitor area. Call us if areas opens or you have concerns. May cover area with gauze. Electronic Signature(s) Signed: 08/12/2020 5:19:59 PM By: Worthy Keeler PA-C Signed: 08/12/2020 6:05:01 PM By: Lorrin Jackson Entered By: Lorrin Jackson on 08/12/2020 14:06:11 -------------------------------------------------------------------------------- Problem List Details Patient Name: Date of Service: Raymond Hurst, [name] 08/12/2020 1:15 PM Medical Record Number: [id number] Patient Account Number: [id number] Date of Birth/Sex: Treating RN: [birth year] (70 y.o. [name] Primary Care Provider: Jenene Slicker Other Clinician: Referring  Provider: Treating Provider/Extender: Leo Rod, EA GLE Weeks in Treatment: 0 Active Problems ICD-10 Encounter Code Description Active Date MDM Diagnosis I89.0 Lymphedema, not elsewhere classified 08/12/2020 No Yes L97.812 Non-pressure chronic ulcer of other part of right lower leg with fat layer 08/12/2020 No Yes exposed I48.0 Paroxysmal atrial fibrillation 08/12/2020 No Yes I10 Essential (primary) hypertension 08/12/2020 No Yes Z79.01 Long term (current) use of anticoagulants 08/12/2020 No Yes Inactive Problems Resolved Problems Electronic Signature(s) Signed: 08/12/2020 1:53:34 PM By: Worthy Keeler PA-C Entered By: Worthy Keeler on 08/12/2020 13:53:33 -------------------------------------------------------------------------------- Progress Note Details Patient Name: Date of Service: Raymond Hurst, Neiko R. 08/12/2020 1:15 PM Medical Record Number: [id number] Patient Account Number: [id number] Date of Birth/Sex: Treating RN: [birth year] (70 y.o. [name] Primary Care Provider: Jenene Slicker Other Clinician: Referring Provider: Treating Provider/Extender: Leo Rod, EA GLE Weeks in Treatment: 0 Subjective Chief Complaint Information obtained from Patient Right LE Ulcer History of Present Illness (HPI) 08/12/2020 upon evaluation today patient appears to be doing actually pretty well" regard to his right lower extremity ulcer. This was actually something that he tells me has been present for about 6 weeks. He was seen at urgent care 2 weeks ago he was given Doxy and Silvadene. Subsequently he has been using this and taking care of this on his own. Looks like he is actually done a pretty good job and in fact with his compression and use of the medication this appears to be healed based on what I see today. He does tell me that he had a venous ablation 18 years ago he does not have lymphedema pumps. He does have a ongoing prescription for Lasix and again  he does wear compression stockings he gets them from [facility] and has them on today upon evaluation. He also has a history of atrial fibrillation for which she is on long-term anticoagulant therapy. He also has a history of lymphedema. Patient History Information obtained from Patient. Allergies No Known Drug Allergies Family History Diabetes - Maternal Grandparents, Heart Disease - Mother, Lung Disease - Mother,Father, No family history of Cancer, Hereditary Spherocytosis, Hypertension, Kidney  Disease, Seizures, Stroke, Thyroid Problems, Tuberculosis. Social History Former smoker - quit 40 years ago, Marital Status - Married, Alcohol Use - Moderate - beer 2 a week, Drug Use - No History, Caffeine Use - Daily - coffee. Medical History Eyes Denies history of Cataracts, Optic Neuritis Ear/Nose/Mouth/Throat Denies history of Chronic sinus problems/congestion, Middle ear problems Hematologic/Lymphatic Patient has history of Lymphedema Denies history of Anemia, Hemophilia, Human Immunodeficiency Virus, Sickle Cell Disease Respiratory Denies history of Aspiration, Asthma, Chronic Obstructive Pulmonary Disease (COPD), Pneumothorax, Sleep Apnea, Tuberculosis Cardiovascular Patient has history of Arrhythmia - A.fib Denies history of Angina, Congestive Heart Failure, Coronary Artery Disease, Deep Vein Thrombosis, Hypertension, Hypotension, Myocardial Infarction, Peripheral Arterial Disease, Peripheral Venous Disease, Phlebitis, Vasculitis Gastrointestinal Denies history of Cirrhosis , Colitis, Crohnoos, Hepatitis A, Hepatitis B, Hepatitis C Endocrine Denies history of Type I Diabetes, Type II Diabetes Genitourinary Denies history of End Stage Renal Disease Immunological Denies history of Lupus Erythematosus, Raynaudoos, Scleroderma Integumentary (Skin) Denies history of History of Burn Musculoskeletal Patient has history of Osteoarthritis - right ankle x2 Denies history of Gout,  Rheumatoid Arthritis, Osteomyelitis Neurologic Denies history of Dementia, Neuropathy, Quadriplegia, Paraplegia, Seizure Disorder Oncologic Denies history of Received Chemotherapy, Received Radiation Psychiatric Denies history of Anorexia/bulimia, Confinement Anxiety Hospitalization/Surgery History - A. Fib 2020. Review of Systems (ROS) Constitutional Symptoms (General Health) Denies complaints or symptoms of Fatigue, Fever, Chills, Marked Weight Change. Eyes Complains or has symptoms of Glasses / Contacts - glasses. Denies complaints or symptoms of Dry Eyes, Vision Changes. Ear/Nose/Mouth/Throat Denies complaints or symptoms of Chronic sinus problems or rhinitis. Respiratory Denies complaints or symptoms of Chronic or frequent coughs, Shortness of Breath. Cardiovascular Denies complaints or symptoms of Chest pain. Gastrointestinal Denies complaints or symptoms of Frequent diarrhea, Nausea, Vomiting. Endocrine Denies complaints or symptoms of Heat/cold intolerance. Genitourinary Denies complaints or symptoms of Frequent urination. Integumentary (Skin) Complains or has symptoms of Wounds - currently. Musculoskeletal Denies complaints or symptoms of Muscle Pain, Muscle Weakness. Neurologic Denies complaints or symptoms of Numbness/parasthesias. Psychiatric Denies complaints or symptoms of Claustrophobia, Suicidal. Objective Constitutional sitting or standing blood pressure is within target range for patient.. pulse regular and within target range for patient.Marland Kitchen respirations regular, non-labored and within target range for patient.Marland Kitchen temperature within target range for patient.. Obese and well-hydrated in no acute distress. Vitals Time Taken: 1:25 PM, Height: 72 in, Source: Stated, Weight: 315 lbs, Source: Stated, BMI: 42.7, Temperature: 97.6 F, Pulse: 80 bpm, Respiratory Rate: 20 breaths/min, Blood Pressure: 116/78 mmHg. Eyes conjunctiva clear no eyelid edema noted. pupils  equal round and reactive to light and accommodation. Ears, Nose, Mouth, and Throat no gross abnormality of ear auricles or external auditory canals. normal hearing noted during conversation. mucus membranes moist. Respiratory normal breathing without difficulty. Cardiovascular 2+ dorsalis pedis/posterior tibialis pulses. no clubbing, cyanosis, significant edema, Musculoskeletal normal gait and posture. no significant deformity or arthritic changes, no loss or range of motion, no clubbing. Psychiatric this patient is able to make decisions and demonstrates good insight into disease process. Alert and Oriented x 3. pleasant and cooperative. General Notes: Upon inspection patient's wound bed actually showed signs of being completely healed. There was a little dry skin that was on the surface of the wound mechanically debrided this away without the use of a curette and there was actually nothing underlying. Overall I am actually very pleased with where things stand today. There is no signs of active infection at this time. No fevers, chills, nausea, vomiting, or diarrhea. Assessment Active Problems ICD-10  Lymphedema, not elsewhere classified Non-pressure chronic ulcer of other part of right lower leg with fat layer exposed Paroxysmal atrial fibrillation Essential (primary) hypertension Long term (current) use of anticoagulants Plan Follow-up Appointments: Other: - Consult Only: Continue to wear compression stockings and monitor area. Call us if areas opens or you have concerns. May cover area with gauze. 1. I would recommend currently that we going continue with the wound care measures as before and the patient is in agreement the plan. This includes the use of the compression stockings I think that is can be the ideal thing here. 2. I am also can recommend that we have the patient continue to monitor for any signs of worsening and if anything changes he should let me know. Right now although  I see nothing open wound which needs to be managed by wound care. 3. In regard to his chronic lymphedema and leg swelling I definitely think this is something where he needs to be wearing compression and ongoing basis. That was discussed today he does wear this. Otherwise it does not appear that he is having any significant issues his most recent hemoglobin A1c was 5.2 and that was on 07/27/2020. We will see the patient back for follow-up visit as needed. Electronic Signature(s) Signed: 08/12/2020 2:12:58 PM By: Worthy Keeler PA-C Entered By: Worthy Keeler on 08/12/2020 14:12:58 -------------------------------------------------------------------------------- HxROS Details Patient Name: Date of Service: Raymond Hurst, Fritz R. 08/12/2020 1:15 PM Medical Record Number: [id number] Patient Account Number: [id number] Date of Birth/Sex: Treating RN: February 23, 1950 (70 y.o. [name] Primary Care Provider: Jenene Slicker Other Clinician: Referring Provider: Treating Provider/Extender: Leo Rod, EA GLE Weeks in Treatment: 0 Information Obtained From Patient Constitutional Symptoms (General Health) Complaints and Symptoms: Negative for: Fatigue; Fever; Chills; Marked Weight Change Eyes Complaints and Symptoms: Positive for: Glasses / Contacts - glasses Negative for: Dry Eyes; Vision Changes Medical History: Negative for: Cataracts; Optic Neuritis Ear/Nose/Mouth/Throat Complaints and Symptoms: Negative for: Chronic sinus problems or rhinitis Medical History: Negative for: Chronic sinus problems/congestion; Middle ear problems Respiratory Complaints and Symptoms: Negative for: Chronic or frequent coughs; Shortness of Breath Medical History: Negative for: Aspiration; Asthma; Chronic Obstructive Pulmonary Disease (COPD); Pneumothorax; Sleep Apnea; Tuberculosis Cardiovascular Complaints and Symptoms: Negative for: Chest pain Medical History: Positive for: Arrhythmia -  A.fib Negative for: Angina; Congestive Heart Failure; Coronary Artery Disease; Deep Vein Thrombosis; Hypertension; Hypotension; Myocardial Infarction; Peripheral Arterial Disease; Peripheral Venous Disease; Phlebitis; Vasculitis Gastrointestinal Complaints and Symptoms: Negative for: Frequent diarrhea; Nausea; Vomiting Medical History: Negative for: Cirrhosis ; Colitis; Crohns; Hepatitis A; Hepatitis B; Hepatitis C Endocrine Complaints and Symptoms: Negative for: Heat/cold intolerance Medical History: Negative for: Type I Diabetes; Type II Diabetes Genitourinary Complaints and Symptoms: Negative for: Frequent urination Medical History: Negative for: End Stage Renal Disease Integumentary (Skin) Complaints and Symptoms: Positive for: Wounds - currently Medical History: Negative for: History of Burn Musculoskeletal Complaints and Symptoms: Negative for: Muscle Pain; Muscle Weakness Medical History: Positive for: Osteoarthritis - right ankle x2 Negative for: Gout; Rheumatoid Arthritis; Osteomyelitis Neurologic Complaints and Symptoms: Negative for: Numbness/parasthesias Medical History: Negative for: Dementia; Neuropathy; Quadriplegia; Paraplegia; Seizure Disorder Psychiatric Complaints and Symptoms: Negative for: Claustrophobia; Suicidal Medical History: Negative for: Anorexia/bulimia; Confinement Anxiety Hematologic/Lymphatic Medical History: Positive for: Lymphedema Negative for: Anemia; Hemophilia; Human Immunodeficiency Virus; Sickle Cell Disease Immunological Medical History: Negative for: Lupus Erythematosus; Raynauds; Scleroderma Oncologic Medical History: Negative for: Received Chemotherapy; Received Radiation Immunizations Pneumococcal Vaccine: Received Pneumococcal Vaccination: No Implantable Devices No devices added  Hospitalization / Surgery History Type of Hospitalization/Surgery A. Fib 2020 Family and Social History Cancer: No; Diabetes: Yes -  Maternal Grandparents; Heart Disease: Yes - Mother; Hereditary Spherocytosis: No; Hypertension: No; Kidney Disease: No; Lung Disease: Yes - Mother,Father; Seizures: No; Stroke: No; Thyroid Problems: No; Tuberculosis: No; Former smoker - quit 40 years ago; Marital Status - Married; Alcohol Use: Moderate - beer 2 a week; Drug Use: No History; Caffeine Use: Daily - coffee; Financial Concerns: No; Food, Clothing or Shelter Needs: No; Support System Lacking: No; Transportation Concerns: No Electronic Signature(s) Signed: 08/12/2020 5:19:59 PM By: Worthy Keeler PA-C Signed: 08/12/2020 6:29:13 PM By: Deon Pilling Entered By: Deon Pilling on 08/12/2020 13:39:56 -------------------------------------------------------------------------------- SuperBill Details Patient Name: Date of Service: Raymond Hurst, Dawud R. 08/12/2020 Medical Record Number: [id number] Patient Account Number: [id number] Date of Birth/Sex: Treating RN: February 29, 1950 (70 y.o. [name] Primary Care Provider: Anthonette Legato GLE Other Clinician: Referring Provider: Treating Provider/Extender: Leo Rod, EA GLE Weeks in Treatment: 0 Diagnosis Coding ICD-10 Codes Code Description I89.0 Lymphedema, not elsewhere classified L97.812 Non-pressure chronic ulcer of other part of right lower leg with fat layer exposed I48.0 Paroxysmal atrial fibrillation I10 Essential (primary) hypertension Z79.01 Long term (current) use of anticoagulants Facility Procedures CPT4 Code: [id number] Description: 99213 - WOUND CARE VISIT-LEV 3 EST PT Modifier: 25 Quantity: 1 Physician Procedures : CPT4 Code Description Modifier [id number] WC PHYS LEVEL 3 NEW PT ICD-10 Diagnosis Description I89.0 Lymphedema, not elsewhere classified A44.975 Non-pressure chronic ulcer of other part of right lower leg with fat layer exposed I48.0 Paroxysmal atrial  fibrillation I10 Essential (primary) hypertension Quantity: 1 Electronic Signature(s) Signed:  08/12/2020 2:13:13 PM By: Worthy Keeler PA-C Entered By: Worthy Keeler on 08/12/2020 14:13:13

## 2020-08-12 NOTE — Progress Notes (Signed)
ISMEAL, HEIDER ([id number]) Visit Report for 08/12/2020 Abuse/Suicide Risk Screen Details Patient Name: Date of Service: [facility] MES, Osamu R. 08/12/2020 1:15 PM Medical Record Number: [id number] Patient Account Number: [id number] Date of Birth/Sex: Treating RN: Mar 31, 1950 (70 y.o. [name] Primary Care : Jenene Slicker Other Clinician: Referring : Treating /Extender: Leo Rod, EA GLE Weeks in Treatment: 0 Abuse/Suicide Risk Screen Items Answer ABUSE RISK SCREEN: Has anyone close to you tried to hurt or harm you recentlyo No Do you feel uncomfortable with anyone in your familyo No Has anyone forced you do things that you didnt want to doo No Electronic Signature(s) Signed: 08/12/2020 6:29:13 PM By: Deon Pilling Entered By: Deon Pilling on 08/12/2020 13:32:41 -------------------------------------------------------------------------------- Activities of Daily Living Details Patient Name: Date of Service: [facility] MES, Eleftherios R. 08/12/2020 1:15 PM Medical Record Number: [id number] Patient Account Number: [id number] Date of Birth/Sex: Treating RN: 1950/10/13 (70 y.o. [name] Primary Care : Jenene Slicker Other Clinician: Referring : Treating /Extender: Leo Rod, EA GLE Weeks in Treatment: 0 Activities of Daily Living Items Answer Activities of Daily Living (Please select one for each item) Drive Automobile Completely Able T Medications ake Completely Able Use T elephone Completely Able Care for Appearance Completely Able Use T oilet Completely Able Bath / Shower Completely Able Dress Self Completely Able Feed Self Completely Able Walk Completely Able Get In / Out Bed Completely Able Housework Completely Able Prepare Meals Completely Able Handle Money Completely Able Shop for Self Completely Able Electronic Signature(s) Signed: 08/12/2020 6:29:13 PM By: Deon Pilling Entered By: Deon Pilling on  08/12/2020 13:32:55 -------------------------------------------------------------------------------- Education Screening Details Patient Name: Date of Service: [name], Carry R. 08/12/2020 1:15 PM Medical Record Number: [id number] Patient Account Number: [id number] Date of Birth/Sex: Treating RN: [birth year] (70 y.o. [name] Primary Care : Jenene Slicker Other Clinician: Referring : Treating /Extender: Leo Rod, EA GLE Weeks in Treatment: 0 Primary Learner Assessed: Patient Learning Preferences/Education Level/Primary Language Learning Preference: Explanation, Demonstration, Printed Material Highest Education Level: College or Above Preferred Language: English Cognitive Barrier Language Barrier: No Translator Needed: No Memory Deficit: No Emotional Barrier: No Cultural/Religious Beliefs Affecting Medical Care: No Physical Barrier Impaired Vision: Yes Glasses, reading Impaired Hearing: Yes Hearing Aid, does not wear. Decreased Hand dexterity: No Knowledge/Comprehension Knowledge Level: High Comprehension Level: High Ability to understand written instructions: High Ability to understand verbal instructions: High Motivation Anxiety Level: Calm Cooperation: Cooperative Education Importance: Acknowledges Need Interest in Health Problems: Asks Questions Perception: Coherent Willingness to Engage in Self-Management High Activities: Readiness to Engage in Self-Management High Activities: Electronic Signature(s) Signed: 08/12/2020 6:29:13 PM By: Deon Pilling Entered By: Deon Pilling on 08/12/2020 13:33:23 -------------------------------------------------------------------------------- Fall Risk Assessment Details Patient Name: Date of Service: [name], Lannis R. 08/12/2020 1:15 PM Medical Record Number: [id number] Patient Account Number: [id number] Date of Birth/Sex: Treating RN: Apr 11, 1950 (70 y.o. [name] Primary Care  : Jenene Slicker Other Clinician: Referring : Treating /Extender: Leo Rod, EA GLE Weeks in Treatment: 0 Fall Risk Assessment Items Have you had 2 or more falls in the last 12 monthso 0 No Have you had any fall that resulted in injury in the last 12 monthso 0 No FALLS RISK SCREEN History of falling - immediate or within 3 months 0 No Secondary diagnosis (Do you have 2 or more medical diagnoseso) 0 No Ambulatory aid None/bed rest/wheelchair/nurse 0 Yes Crutches/cane/walker 0 No Furniture 0 No Intravenous therapy Access/Saline/Heparin  Lock 0 No Gait/Transferring Normal/ bed rest/ wheelchair 0 Yes Weak (short steps with or without shuffle, stooped but able to lift head while walking, may seek 0 No support from furniture) Impaired (short steps with shuffle, may have difficulty arising from chair, head down, impaired 0 No balance) Mental Status Oriented to own ability 0 Yes Electronic Signature(s) Signed: 08/12/2020 6:29:13 PM By: Deon Pilling Entered By: Deon Pilling on 08/12/2020 13:33:39 -------------------------------------------------------------------------------- Foot Assessment Details Patient Name: Date of Service: [name], Elion R. 08/12/2020 1:15 PM Medical Record Number: [id number] Patient Account Number: [id number] Date of Birth/Sex: Treating RN: 01-16-1951 (70 y.o. [name] Primary Care : Jenene Slicker Other Clinician: Referring : Treating /Extender: Leo Rod, EA GLE Weeks in Treatment: 0 Foot Assessment Items Site Locations + = Sensation present, - = Sensation absent, C = Callus, U = Ulcer R = Redness, W = Warmth, M = Maceration, PU = Pre-ulcerative lesion F = Fissure, S = Swelling, D = Dryness Assessment Right: Left: Other Deformity: No No Prior Foot Ulcer: No No Prior Amputation: No No Charcot Joint: No No Ambulatory Status: Ambulatory Without Help Gait: Steady Electronic  Signature(s) Signed: 08/12/2020 6:29:13 PM By: Deon Pilling Entered By: Deon Pilling on 08/12/2020 13:40:59 -------------------------------------------------------------------------------- Nutrition Risk Screening Details Patient Name: Date of Service: [name], Sameul R. 08/12/2020 1:15 PM Medical Record Number: [id number] Patient Account Number: [id number] Date of Birth/Sex: Treating RN: 1950/07/01 (70 y.o. [name] Primary Care : Jenene Slicker Other Clinician: Referring : Treating /Extender: Leo Rod, EA GLE Weeks in Treatment: 0 Height (in): 72 Weight (lbs): 315 Body Mass Index (BMI): 42.7 Nutrition Risk Screening Items Score Screening NUTRITION RISK SCREEN: I have an illness or condition that made me change the kind and/or amount of food I eat 2 Yes I eat fewer than two meals per day 0 No I eat few fruits and vegetables, or milk products 0 No I have three or more drinks of beer, liquor or wine almost every day 0 No I have tooth or mouth problems that make it hard for me to eat 0 No I don't always have enough money to buy the food I need 0 No I eat alone most of the time 0 No I take three or more different prescribed or over-the-counter drugs a day 1 Yes Without wanting to, I have lost or gained 10 pounds in the last six months 0 No I am not always physically able to shop, cook and/or feed myself 0 No Nutrition Protocols Good Risk Protocol Moderate Risk Protocol 0 Provide education on nutrition High Risk Proctocol Risk Level: Moderate Risk Score: 3 Electronic Signature(s) Signed: 08/12/2020 6:29:13 PM By: Deon Pilling Entered By: Deon Pilling on 08/12/2020 13:33:49

## 2020-08-12 NOTE — Progress Notes (Signed)
MINAS, BONSER ([id number]) Visit Report for 08/12/2020 Allergy List Details Patient Name: Date of Service: [facility] Hurst, [name] 08/12/2020 1:15 PM Medical Record Number: [id number] Patient Account Number: [id number] Date of Birth/Sex: Treating RN: 01-18-1951 (70 y.o. Raymond Hurst Primary Care : Raymond Hurst Other Clinician: Referring : Treating /Extender: Raymond Hurst, Raymond Hurst in Treatment: 0 Allergies Active Allergies No Known Drug Allergies Allergy Notes Electronic Signature(s) Signed: 08/12/2020 6:29:13 PM By: Raymond Hurst Entered By: Raymond Hurst on 08/12/2020 13:30:10 -------------------------------------------------------------------------------- Arrival Information Details Patient Name: Date of Service: [name], [name] 08/12/2020 1:15 PM Medical Record Number: [id number] Patient Account Number: [id number] Date of Birth/Sex: Treating RN: November 13, 1950 (70 y.o. Raymond Hurst Primary Care : Raymond Hurst Other Clinician: Referring : Treating /Extender: Raymond Hurst, Raymond Hurst in Treatment: 0 Visit Information Patient Arrived: Ambulatory Arrival Time: 13:27 Accompanied By: self Transfer Assistance: None Patient Identification Verified: Yes Secondary Verification Process Completed: Yes Patient Requires Transmission-Based Precautions: No Patient Has Alerts: Yes Patient Alerts: Patient on Blood Thinner Electronic Signature(s) Signed: 08/12/2020 6:29:13 PM By: Raymond Hurst Entered By: Raymond Hurst on 08/12/2020 13:28:35 -------------------------------------------------------------------------------- Clinic Level of Care Assessment Details Patient Name: Date of Service: [facility] Hurst, Zanden R. 08/12/2020 1:15 PM Medical Record Number: [id number] Patient Account Number: [id number] Date of Birth/Sex: Treating RN: 1950/09/18 (70 y.o. Raymond Hurst Primary Care : Raymond Hurst Other  Clinician: Referring : Treating /Extender: Raymond Hurst, Raymond Hurst in Treatment: 0 Clinic Level of Care Assessment Items TOOL 2 Quantity Score X- 1 0 Use when only an EandM is performed on the INITIAL visit ASSESSMENTS - Nursing Assessment / Reassessment X- 1 20 General Physical Exam (combine w/ comprehensive assessment (listed just below) when performed on new pt. evals) X- 1 25 Comprehensive Assessment (HX, ROS, Risk Assessments, Wounds Hx, etc.) ASSESSMENTS - Wound and Skin A ssessment / Reassessment X - Simple Wound Assessment / Reassessment - one wound 1 5 []  - 0 Complex Wound Assessment / Reassessment - multiple wounds []  - 0 Dermatologic / Skin Assessment (not related to wound area) ASSESSMENTS - Ostomy and/or Continence Assessment and Care []  - 0 Incontinence Assessment and Management []  - 0 Ostomy Care Assessment and Management (repouching, etc.) PROCESS - Coordination of Care []  - 0 Simple Patient / Family Education for ongoing care X- 1 20 Complex (extensive) Patient / Family Education for ongoing care []  - 0 Staff obtains Programmer, systems, Records, T Results / Process Orders est []  - 0 Staff telephones HHA, Nursing Homes / Clarify orders / etc []  - 0 Routine Transfer to another Facility (non-emergent condition) []  - 0 Routine Hospital Admission (non-emergent condition) []  - 0 New Admissions / Biomedical engineer / Ordering NPWT Apligraf, etc. , []  - 0 Emergency Hospital Admission (emergent condition) []  - 0 Simple Discharge Coordination []  - 0 Complex (extensive) Discharge Coordination PROCESS - Special Needs []  - 0 Pediatric / Minor Patient Management []  - 0 Isolation Patient Management []  - 0 Hearing / Language / Visual special needs []  - 0 Assessment of Community assistance (transportation, D/C planning, etc.) []  - 0 Additional assistance / Altered mentation []  - 0 Support Surface(s) Assessment (bed, cushion, seat,  etc.) INTERVENTIONS - Wound Cleansing / Measurement []  - 0 Wound Imaging (photographs - any number of wounds) []  - 0 Wound Tracing (instead of photographs) []  - 0 Simple Wound Measurement - one wound []  - 0 Complex Wound Measurement - multiple wounds []  -  0 Simple Wound Cleansing - one wound []  - 0 Complex Wound Cleansing - multiple wounds INTERVENTIONS - Wound Dressings []  - 0 Small Wound Dressing one or multiple wounds []  - 0 Medium Wound Dressing one or multiple wounds []  - 0 Large Wound Dressing one or multiple wounds []  - 0 Application of Medications - injection INTERVENTIONS - Miscellaneous []  - 0 External ear exam []  - 0 Specimen Collection (cultures, biopsies, blood, body fluids, etc.) []  - 0 Specimen(s) / Culture(s) sent or taken to Lab for analysis []  - 0 Patient Transfer (multiple staff / Civil Service fast streamer / Similar devices) []  - 0 Simple Staple / Suture removal (25 or less) []  - 0 Complex Staple / Suture removal (26 or more) []  - 0 Hypo / Hyperglycemic Management (close monitor of Blood Glucose) X- 1 15 Ankle / Brachial Index (ABI) - do not check if billed separately Has the patient been seen at the hospital within the last three years: Yes Total Score: 85 Level Of Care: New/Established - Level 3 Electronic Signature(s) Signed: 08/12/2020 6:05:01 PM By: Lorrin Jackson Entered By: Lorrin Jackson on 08/12/2020 14:04:10 -------------------------------------------------------------------------------- Encounter Discharge Information Details Patient Name: Date of Service: [name], [name] 08/12/2020 1:15 PM Medical Record Number: [id number] Patient Account Number: [id number] Date of Birth/Sex: Treating RN: [birth year] (70 y.o. Raymond Hurst Primary Care : Raymond Hurst Other Clinician: Referring : Treating /Extender: Raymond Hurst, Raymond Hurst in Treatment: 0 Encounter Discharge Information Items Discharge Condition:  Stable Ambulatory Status: Ambulatory Discharge Destination: Home Transportation: Private Auto Schedule Follow-up Appointment: No Clinical Summary of Care: Provided on 08/12/2020 Form Type Recipient Paper Patient Patient Electronic Signature(s) Signed: 08/12/2020 2:11:21 PM By: Lorrin Jackson Entered By: Lorrin Jackson on 08/12/2020 14:11:21 -------------------------------------------------------------------------------- Lower Extremity Assessment Details Patient Name: Date of Service: [name], [name] 08/12/2020 1:15 PM Medical Record Number: [id number] Patient Account Number: [id number] Date of Birth/Sex: Treating RN: 06-23-1950 (70 y.o. Lorette Ang, Meta.Reding Primary Care : Raymond Hurst Other Clinician: Referring : Treating /Extender: Raymond Hurst, Raymond Hurst in Treatment: 0 Edema Assessment Assessed: [Left: No] [Right: Yes] Edema: [Left: Ye] [Right: s] Calf Left: Right: Point of Measurement: 33 cm From Medial Instep 51 cm Ankle Left: Right: Point of Measurement: 11 cm From Medial Instep 28 cm Knee To Floor Left: Right: From Medial Instep 44 cm Vascular Assessment Pulses: Dorsalis Pedis Palpable: [Right:Yes] Doppler Audible: [Right:Yes] Posterior Tibial Doppler Audible: [Right:Yes] Blood Pressure: Brachial: [Right:116] Ankle: [Right:Dorsalis Pedis: 136 1.17] Electronic Signature(s) Signed: 08/12/2020 6:29:13 PM By: Raymond Hurst Entered By: Raymond Hurst on 08/12/2020 13:44:44 -------------------------------------------------------------------------------- Pain Assessment Details Patient Name: Date of Service: [name], [name] 08/12/2020 1:15 PM Medical Record Number: [id number] Patient Account Number: [id number] Date of Birth/Sex: Treating RN: 1950-11-14 (70 y.o. Raymond Hurst Primary Care : Raymond Hurst Other Clinician: Referring : Treating /Extender: Raymond Hurst, Raymond Hurst in Treatment:  0 Active Problems Location of Pain Severity and Description of Pain Patient Has Paino No Site Locations Rate the pain. Current Pain Level: 0 Pain Management and Medication Current Pain Management: Medication: No Cold Application: No Rest: No Massage: No Activity: No T.E.N.S.: No Heat Application: No Leg drop or elevation: No Is the Current Pain Management Adequate: Adequate How does your wound impact your activities of daily livingo Sleep: No Bathing: No Appetite: No Relationship With Others: No Bladder Continence: No Emotions: No Bowel Continence: No Work: No Toileting: No Drive: No Dressing: No Hobbies:  No Electronic Signature(s) Signed: 08/12/2020 6:29:13 PM By: Raymond Hurst Entered By: Raymond Hurst on 08/12/2020 13:34:02 -------------------------------------------------------------------------------- Patient/Caregiver Education Details Patient Name: Date of Service: [name], [name] 7/13/2022andnbsp1:15 PM Medical Record Number: [id number] Patient Account Number: [id number] Date of Birth/Gender: Treating RN: [birth year] (70 y.o. Raymond Hurst Primary Care Physician: Raymond Hurst Other Clinician: Referring Physician: Treating Physician/Extender: Raymond Hurst, Raymond Hurst in Treatment: 0 Education Assessment Education Provided To: Patient Education Topics Provided Venous: Methods: Explain/Verbal, Printed Responses: State content correctly Electronic Signature(s) Signed: 08/12/2020 6:05:01 PM By: Lorrin Jackson Entered By: Lorrin Jackson on 08/12/2020 14:03:24 -------------------------------------------------------------------------------- Vitals Details Patient Name: Date of Service: [name], [name] 08/12/2020 1:15 PM Medical Record Number: [id number] Patient Account Number: [id number] Date of Birth/Sex: Treating RN: [birth year] (70 y.o. Raymond Hurst Primary Care : Raymond Hurst Other Clinician: Referring  : Treating /Extender: Raymond Hurst, Raymond Hurst in Treatment: 0 Vital Signs Time Taken: 13:25 Temperature (F): 97.6 Height (in): 72 Pulse (bpm): 80 Source: Stated Respiratory Rate (breaths/min): 20 Weight (lbs): 315 Blood Pressure (mmHg): 116/78 Source: Stated Reference Range: 80 - 120 mg / dl Body Mass Index (BMI): 42.7 Electronic Signature(s) Signed: 08/12/2020 6:29:13 PM By: Raymond Hurst Entered By: Raymond Hurst on 08/12/2020 13:32:27

## 2020-08-19 ENCOUNTER — Encounter: Payer: Medicare HMO | Admitting: Psychology

## 2020-10-15 DIAGNOSIS — M1712 Unilateral primary osteoarthritis, left knee: Secondary | ICD-10-CM | POA: Diagnosis not present

## 2020-10-15 DIAGNOSIS — M1711 Unilateral primary osteoarthritis, right knee: Secondary | ICD-10-CM | POA: Diagnosis not present

## 2020-10-29 ENCOUNTER — Other Ambulatory Visit: Payer: Self-pay | Admitting: Cardiology

## 2020-10-29 NOTE — Telephone Encounter (Signed)
Prescription refill request for Eliquis received. Indication: PAF Last office visit: 02/03/20  Myles Gip MD Scr: 0.99 on 07/21/20 Age: 70 Weight: 153.8kg  Based on above findings Eliquis 5mg  twice daily is the appropriate dose.  Refill approved.

## 2020-10-30 NOTE — Progress Notes (Deleted)
CARDIOLOGY CONSULT NOTE       Patient ID: Raymond Hurst MRN: [id number] DOB/AGE: 1950-05-11 70 y.o.  Admit date: (Not on file) Referring Physician: Domenic Hurst Primary Physician: Raymond Hurst Primary Cardiologist: Raymond Hurst Reason for Consultation: Afib  Active Problems:   * No active hospital problems. *   HPI:  70 y.o. referred by Dr Raymond Hurst for f/u afib. This is chronic Patient maintained on eliquis and lopressor. No bleeding issues. He is obese and on lasix for edema TTE done 05/06/19 EF 55-60% no significant valve disease aortic root 4.3 cm  His CADVASC is 3 He weighs over 300 lbs Some recent concern regarding his memory  has been seen by Raymond Hurst and diagnosed with Neurocognitive disorder He has a history per Dr Raymond Hurst notes 2021 of ETOH abuse with 40 beers/week and scotch as well   ***  ROS All other systems reviewed and negative except as noted above  Past Medical History:  Diagnosis Date   Atrial fibrillation ([place])    Cellulitis of right lower leg    History of TIA (transient ischemic attack)    May 2020   Lymphedema     Family History  Problem Relation Age of Onset   Heart failure Mother    COPD Mother    Atrial fibrillation Mother    Idiopathic pulmonary fibrosis Father     Social History   Socioeconomic History   Marital status: Married    Spouse name: Not on file   Number of children: Not on file   Years of education: Not on file   Highest education level: Not on file  Occupational History   Not on file  Tobacco Use   Smoking status: Former    Packs/day: 2.00    Years: 20.00    Pack years: 40.00    Types: Cigarettes    Quit date: 01/31/1990    Years since quitting: 30.7   Smokeless tobacco: Never  Vaping Use   Vaping Use: Never used  Substance and Sexual Activity   Alcohol use: Yes    Comment: 4-5 beer daily   Drug use: No   Sexual activity: Not on file  Other Topics Concern   Not on file  Social History  Narrative   Right handed.    Social Determinants of Health   Financial Resource Strain: Not on file  Food Insecurity: Not on file  Transportation Needs: Not on file  Physical Activity: Not on file  Stress: Not on file  Social Connections: Not on file  Intimate Partner Violence: Not on file    Past Surgical History:  Procedure Laterality Date   INNER EAR SURGERY     ORTHOPEDIC SURGERY        Current Outpatient Medications:    doxycycline (VIBRAMYCIN) 100 MG capsule, Take 1 capsule (100 mg total) by mouth 2 (two) times daily., Disp: 20 capsule, Rfl: 0   ELIQUIS 5 MG TABS tablet, TAKE 1 TABLET BY MOUTH TWICE A DAY, Disp: 60 tablet, Rfl: 6   furosemide (LASIX) 40 MG tablet, Take 1 tablet (40 mg total) by mouth daily., Disp: 5 tablet, Rfl: 0   ibuprofen (ADVIL) 200 MG tablet, Take 200 mg by mouth every 6 (six) hours as needed., Disp: , Rfl:    metoprolol tartrate (LOPRESSOR) 25 MG tablet, TAKE 1 TABLET BY MOUTH TWICE A DAY, Disp: 180 tablet, Rfl: 2   silver sulfADIAZINE (SILVADENE) 1 % cream, Apply 1 application topically daily. Clean site of wound  and apply cream., Disp: 100 g, Rfl: 0    Physical Exam: There were no vitals taken for this visit.    Affect appropriate Chronically ill  HEENT: normal Neck supple with no adenopathy JVP normal no bruits no thyromegaly Lungs clear with no wheezing and good diaphragmatic motion Heart:  S1/S2 no murmur, no rub, gallop or click PMI normal Abdomen: benighn, BS positve, no tenderness, no AAA no bruit.  No HSM or HJR Distal pulses intact with no bruits Plus one edema Neuro non-focal Skin warm and dry No muscular weakness   Labs:   Lab Results  Component Value Date   WBC 6.6 07/21/2020   HGB 15.7 07/21/2020   HCT 45.5 07/21/2020   MCV 103 (H) 07/21/2020   PLT CANCELED 07/21/2020   No results for input(s): NA, K, CL, CO2, BUN, CREATININE, CALCIUM, PROT, BILITOT, ALKPHOS, ALT, AST, GLUCOSE in the last 168 hours.  Invalid  input(s): LABALBU Lab Results  Component Value Date   TROPONINI <0.03 06/08/2018    Lab Results  Component Value Date   CHOL 129 06/08/2018   Lab Results  Component Value Date   HDL 31 (L) 06/08/2018   Lab Results  Component Value Date   LDLCALC 81 06/08/2018   Lab Results  Component Value Date   TRIG 84 06/08/2018   Lab Results  Component Value Date   CHOLHDL 4.2 06/08/2018   No results found for: LDLDIRECT    Radiology: No results found.  EKG: AFib LAD LVH chronic 08/20/19    ASSESSMENT AND PLAN:   Afib:  chronic likely related to ETOH continue eliquis and lopressor Cognitive Decline:  f/u neuro likely related to ETOH as well    ***  Signed: Jenkins Hurst 10/30/2020, 5:40 PM

## 2020-11-03 ENCOUNTER — Ambulatory Visit: Payer: Medicare HMO | Admitting: Cardiovascular Disease

## 2020-11-04 ENCOUNTER — Other Ambulatory Visit (HOSPITAL_COMMUNITY)
Admission: RE | Admit: 2020-11-04 | Discharge: 2020-11-04 | Disposition: A | Discharge status: Home / Self Care | Payer: Medicare HMO | Source: Ambulatory Visit | Attending: Cardiology | Admitting: Cardiology

## 2020-11-04 ENCOUNTER — Other Ambulatory Visit: Payer: Self-pay

## 2020-11-04 ENCOUNTER — Encounter: Payer: Self-pay | Admitting: Cardiology

## 2020-11-04 ENCOUNTER — Telehealth: Payer: Self-pay

## 2020-11-04 ENCOUNTER — Ambulatory Visit: Payer: Medicare HMO | Admitting: Cardiology

## 2020-11-04 VITALS — BP 122/82 | HR 86 | Ht 72.0 in | Wt 327.0 lb

## 2020-11-04 DIAGNOSIS — I4821 Permanent atrial fibrillation: Secondary | ICD-10-CM

## 2020-11-04 LAB — CBC
HCT: 46.6 % (ref 39.0–52.0)
Hemoglobin: 16.2 g/dL (ref 13.0–17.0)
MCH: 34.7 pg — ABNORMAL HIGH (ref 26.0–34.0)
MCHC: 34.8 g/dL (ref 30.0–36.0)
MCV: 99.8 fL (ref 80.0–100.0)
Platelets: 151 10*3/uL (ref 150–400)
RBC: 4.67 MIL/uL (ref 4.22–5.81)
RDW: 14.1 % (ref 11.5–15.5)
WBC: 7.4 10*3/uL (ref 4.0–10.5)
nRBC: 0 % (ref 0.0–0.2)

## 2020-11-04 MED ORDER — METOPROLOL TARTRATE 25 MG PO TABS: 25.0000 mg | ORAL_TABLET | Freq: Two times a day (BID) | ORAL | 3 refills | Status: DC

## 2020-11-04 MED ORDER — APIXABAN 5 MG PO TABS: 5.0000 mg | ORAL_TABLET | Freq: Two times a day (BID) | ORAL | 3 refills | Status: DC

## 2020-11-04 NOTE — Addendum Note (Signed)
Addended by: Antonieta Iba on: 11/04/2020 02:21 PM   Modules accepted: Orders

## 2020-11-04 NOTE — Patient Instructions (Signed)
Medication Instructions:  Your physician recommends that you continue on your current medications as directed. Please refer to the Current Medication list given to you today.  *If you need a refill on your cardiac medications before your next appointment, please call your pharmacy*   Lab Work: TODAY: CBC If you have labs (blood work) drawn today and your tests are completely normal, you will receive your results only by: [place] (if you have MyChart) OR A paper copy in the mail If you have any lab test that is abnormal or we need to change your treatment, we will call you to review the results.   Testing/Procedures: Your physician has recommended that you have a sleep study. This test records several body functions during sleep, including: brain activity, eye movement, oxygen and carbon dioxide blood levels, heart rate and rhythm, breathing rate and rhythm, the flow of air through your mouth and nose, snoring, body muscle movements, and chest and belly movement.  Follow-Up: At [facility], you and your health needs are our priority.  As part of our continuing mission to provide you with exceptional heart care, we have created designated Provider Care Teams.  These Care Teams include your primary Cardiologist (physician) and Advanced Practice Providers (APPs -  Physician Assistants and Nurse Practitioners) who all work together to provide you with the care you need, when you need it.  Your next appointment:   1 year(s)  The format for your next appointment:   In Person  Provider:   You may see Fransico Him, MD or one of the following Advanced Practice Providers on your designated Care Team:   [place], PA-C  Ermalinda Barrios, [place]

## 2020-11-04 NOTE — Telephone Encounter (Signed)
-----   Message from Sueanne Margarita, MD sent at 11/04/2020  3:18 PM EDT ----- Please let patient know that labs were normal.  Continue current medical therapy.

## 2020-11-04 NOTE — Progress Notes (Signed)
Cardiology Office Note  Date: 11/04/2020   ID: Brandell, Maready 04/04/1950, MRN [id number]  PCP:  [place], [place]  Cardiologist:  Rozann Lesches, MD Electrophysiologist:  None   Chief Complaint  Patient presents with   Atrial Fibrillation    History of Present Illness: Raymond Hurst is a 70 y.o. male with a hx of permanent atrial fibrillation, TIA in 2020 and lymphedema.  He is here today for followup and is doing well.  He has chronic DOE related to obesity which he says is very stable.  He denies any chest pain or pressure, PND, orthopnea, dizziness, palpitations or syncope. He has chronic LE edema from lymphedema and uses compression hose and diuretics.  He denies any significant bleeding problems on DOAC.  He has not had any black stools.  He is compliant with his meds and is tolerating meds with no SE.   His wife is with him and says that he snores but she has never noticed him stop breathing.  He has never had a sleep study done.   Past Medical History:  Diagnosis Date   Atrial fibrillation ([place])    Cellulitis of right lower leg    History of TIA (transient ischemic attack)    May 2020   Lymphedema     Past Surgical History:  Procedure Laterality Date   INNER EAR SURGERY     ORTHOPEDIC SURGERY      Current Outpatient Medications  Medication Sig Dispense Refill   Cyanocobalamin (VITAMIN B 12 PO) Take by mouth.     ELIQUIS 5 MG TABS tablet TAKE 1 TABLET BY MOUTH TWICE A DAY 60 tablet 6   furosemide (LASIX) 40 MG tablet Take 1 tablet (40 mg total) by mouth daily. 5 tablet 0   ibuprofen (ADVIL) 200 MG tablet Take 200 mg by mouth every 6 (six) hours as needed.     metoprolol tartrate (LOPRESSOR) 25 MG tablet TAKE 1 TABLET BY MOUTH TWICE A DAY 180 tablet 2   silver sulfADIAZINE (SILVADENE) 1 % cream Apply 1 application topically daily. Clean site of wound and apply cream. 100 g 0   VITAMIN D PO Take by mouth.     No current facility-administered  medications for this visit.   Allergies:  Patient has no known allergies.   ROS: No frank syncope.  Physical Exam: VS:  BP 122/82 (BP Location: Right Arm, Patient Position: Sitting, Cuff Size: Large)   Pulse 86   Ht 6' (1.829 m)   Wt (!) 327 lb (148.3 kg)   SpO2 98%   BMI 44.35 kg/m , BMI Body mass index is 44.35 kg/m.  Wt Readings from Last 3 Encounters:  11/04/20 (!) 327 lb (148.3 kg)  05/11/20 (!) 329 lb (149.2 kg)  03/25/20 (!) 335 lb (152 kg)  GEN: Well nourished, well developed in no acute distress HEENT: Normal NECK: No JVD; No carotid bruits LYMPHATICS: No lymphadenopathy CARDIAC:irregularly irregular, no murmurs, rubs, gallops RESPIRATORY:  Clear to auscultation without rales, wheezing or rhonchi  ABDOMEN: Soft, non-tender, non-distended MUSCULOSKELETAL:  No edema; No deformity  SKIN: Warm and dry NEUROLOGIC:  Alert and oriented x 3 PSYCHIATRIC:  Normal affect    ECG:  An ECG was personally reviewed today and demonstrated: Atrial fibrillation with CVR  Recent Labwork: 05/11/2020: TSH 3.53 07/21/2020: ALT 30; AST 22; BUN 17; Creatinine, Ser 0.99; Hemoglobin 15.7; Platelets CANCELED; Potassium 4.1; Sodium 142     Component Value Date/Time  CHOL 129 06/08/2018 0634   TRIG 84 06/08/2018 0634   HDL 31 (L) 06/08/2018 0634   CHOLHDL 4.2 06/08/2018 0634   VLDL 17 06/08/2018 0634   LDLCALC 81 06/08/2018 0634    Other Studies Reviewed Today:  Echocardiogram 05/06/2019: 1. Left ventricular ejection fraction, by estimation, is 55 to 60%. The  left ventricle has normal function. The left ventricle has no regional  wall motion abnormalities. There is mild left ventricular hypertrophy.  Left ventricular diastolic parameters  are indeterminate.   2. Right ventricular systolic function is normal. The right ventricular  size is normal.   3. The mitral valve is normal in structure. Trivial mitral valve  regurgitation. No evidence of mitral stenosis.   4. The aortic valve  has an indeterminant number of cusps. Aortic valve  regurgitation is not visualized. No aortic stenosis is present.   5. Aortic dilatation noted. There is mild to moderate dilatation of the  aortic root measuring 43 mm.   6. Indeterminant PASP, inadequate TR jet.   7. The inferior vena cava is normal in size with greater than 50%  respiratory variability, suggesting right atrial pressure of 3 mmHg.   Lower extremity arterial Dopplers 09/06/2019: IMPRESSION: Ultrasound demonstrates no segmental occlusion or high-grade stenosis.  Assessment and Plan:   Permanent atrial fibrillation -he is maintaining afib on EKG with CVR -he denies any palpitations and no bleeding issues on DOAC -he will continue on Eliquis 5mg  BID for CHADS2VASC score of 3 (age>65 and TIA) -continue prescription drug management with Lopressor 25mg  BID -I have personally reviewed and interpreted outside labs performed by patient's PCP showing  SCr 0.99, K+ 4.1 in June 2022 and Hbg 16 in Jan 2021  -check CBC today  2.  Lymphedema -this is chronic and very stable on diuretics -continue Lasix 40mg  daily  3. Morbid obesity with snoring -he has never had a sleep study so I will get a split night sleep study  Medication Adjustments/Labs and Tests Ordered: Current medicines are reviewed at length with the patient today.  Concerns regarding medicines are outlined above.   Tests Ordered: Orders Placed This Encounter  Procedures   EKG 12-Lead     Medication Changes: No orders of the defined types were placed in this encounter.   Disposition:  Follow up  6 months in the [place] office.  Signed, Satira Sark, MD, [facility] 11/04/2020 2:13 PM    [facility], [place], [place] [address] Phone: [phone]; Fax: [phone]

## 2020-11-04 NOTE — Telephone Encounter (Signed)
Pt notified of results and verbalized understanding  

## 2020-11-10 ENCOUNTER — Ambulatory Visit: Payer: Medicare HMO | Admitting: Physician Assistant

## 2020-11-10 NOTE — Progress Notes (Deleted)
Assessment/Plan:    Dementia  MoCA today is    Recommendations:  Discussed safety both in and out of the home.  Discussed the importance of regular daily schedule with inclusion of crossword puzzles to maintain brain function.  Continue to monitor mood by PCP Stay active at least 30 minutes at least 3 times a week.  Naps should be scheduled and should be no longer than 60 minutes and should not occur after 2 PM.  Mediterranean diet is recommended  Continue donepezil 10 mg daily Side effects were discussed Follow up in   months.   Case discussed with Dr. Delice Lesch who agrees with the plan     Subjective:   ED visits since last seen: none  Hospital admissions: none  Raymond Hurst is a 70 y.o. male with a history of hypertension, atrial fibrillation, obesity, presenting for evaluation of memory loss. This patient is accompanied in the office by his  wife who supplements the history.  Previous records as well as any outside records available were reviewed prior to todays visit.  This is a 70 year old right-handed man He is alone in the office today. His wife called earlier to express concerns about the possibility of alcohol dementia or dementia of any sort, reporting he would forget to turn off the stove on multiple occasions. She also reported he talks to himself more and more nowadays. PCP notes from January 2022 indicate worsening memory loss. His wife reported that he had been drinking while taking Eliquis and that he had increased alcohol intake, worried this might be causing the memory loss. Memory loss had been progressing for a year, inability to recall recent conversations or events, how to drive to familiar places, interfering with personal hygiene. He drinks 3-4 glasses of scotch and 3-4 beers every night. He stays up until 1 or 2 am.   He states his memory used to be better.He lives with his wife. He reports getting lost driving only one time a couple of months ago, he was  trying to remember how to get to a certain gas station and went past it and turned around. He manages his own medications and misses a dose infrequently. His wife manages finances. He admits to leaving the stove on 2-3 months ago, he thinks he was in a hurry so he left the burner on. He denies misplacing things frequently. He denies any word-finding difficulties. He has noticed left hand numbness affecting all fingers when playing the guitar. He would shake his hand and symptoms resolve. No pain. He has numbness localized to the medial side of his right second digit, other fingers are unaffected. Feet are unaffected. He has noticed a little more urinary incontinence recently. He has some back soreness, left knee and ankle pain. No headaches, dizziness, diplopia, dysarthria/dysphagia, neck pain, bowel dysfunction,anosmia, or tremors. He does not sleep well, getting around 4 hours of sleep with occasional daytime drowsiness. He has been told her snores. No falls. He reports mood is generally optimistic, he states he does not get upset and has an "even character." Memory Lives with Mood Depression Irritability Fun Sleeps Vivid Dreams Sleepwalking Hallucinations Paranoia Leaving objects in unusual places  Bathing Dressing Medications Finances  Appetite  trouble swallowing.  Cooks. Denies leaving the stove on or the faucet on. Ambulates without difficulty without walker or cane. Falls Head injuries Drive with GPS denies getting lost.  Patient no longer drives. Denies headaches, anosmia, double vision, dizziness, focal numbness or tingling, unilateral  weakness or tremors. Denies urine incontinence , retention, constipation or diarrhea.     HISTORY OF PRESENT ILLNESS 05/25/20: This is a 70 year old right-handed man with a history of hypertension, atrial fibrillation, obesity, presenting for evaluation of memory loss. He is alone in the office today. His wife called earlier to express concerns  about the possibility of alcohol dementia or dementia of any sort, reporting he would forget to turn off the stove on multiple occasions. She also reported he talks to himself more and more nowadays. PCP notes from January 2022 indicate worsening memory loss. His wife reported that he had been drinking while taking Eliquis and that he had increased alcohol intake, worried this might be causing the memory loss. Memory loss had been progressing for a year, inability to recall recent conversations or events, how to drive to familiar places, interfering with personal hygiene. He drinks 3-4 glasses of scotch and 3-4 beers every night. He stays up until 1 or 2 am.   He states his memory used to be better.He lives with his wife. He reports getting lost driving only one time a couple of months ago, he was trying to remember how to get to a certain gas station and went past it and turned around. He manages his own medications and misses a dose infrequently. His wife manages finances. He admits to leaving the stove on 2-3 months ago, he thinks he was in a hurry so he left the burner on. He denies misplacing things frequently. He denies any word-finding difficulties.  He reports drinking 2-3 beers at night and a couple of shots of whiskey. He states this has been on a steady consistent basis for the past 40 years. No family history of dementia, no history of significant head injuries.   He has noticed left hand numbness affecting all fingers when playing the guitar. He would shake his hand and symptoms resolve. No pain. He has numbness localized to the medial side of his right second digit, other fingers are unaffected. Feet are unaffected. He has noticed a little more urinary incontinence recently. He has some back soreness, left knee and ankle pain. No headaches, dizziness, diplopia, dysarthria/dysphagia, neck pain, bowel dysfunction,anosmia, or tremors. He does not sleep well, getting around 4 hours of sleep with  occasional daytime drowsiness. He has been told her snores. No falls. He reports mood is generally optimistic, he states he does not get upset and has an "even character."     Laboratory Data: January 2022: AST and ALT normal  B1 05/11/2020 was 6, B12 483, RPR negative CBC remarkable for MCV 99.8.    PREVIOUS MEDICATIONS:   CURRENT MEDICATIONS:  Outpatient Encounter Medications as of 11/10/2020  Medication Sig   apixaban (ELIQUIS) 5 MG TABS tablet Take 1 tablet (5 mg total) by mouth 2 (two) times daily.   Cyanocobalamin (VITAMIN B 12 PO) Take by mouth.   furosemide (LASIX) 40 MG tablet Take 1 tablet (40 mg total) by mouth daily.   ibuprofen (ADVIL) 200 MG tablet Take 200 mg by mouth every 6 (six) hours as needed.   metoprolol tartrate (LOPRESSOR) 25 MG tablet Take 1 tablet (25 mg total) by mouth 2 (two) times daily.   silver sulfADIAZINE (SILVADENE) 1 % cream Apply 1 application topically daily. Clean site of wound and apply cream.   VITAMIN D PO Take by mouth.   No facility-administered encounter medications on file as of 11/10/2020.     Objective:     PHYSICAL  EXAMINATION:    VITALS:  There were no vitals filed for this visit.  GEN:  The patient appears stated age and is in NAD. HEENT:  Normocephalic, atraumatic.   Neurological examination:  General: NAD, well-groomed, appears stated age. Orientation: The patient is alert. Oriented to person, place and date Cranial nerves: There is good facial symmetry.The speech is fluent and clear. No aphasia or dysarthria. Fund of knowledge is appropriate. Recent and remote memory are impaired. Attention and concentration are reduced.  Able to name objects and repeat phrases.  Hearing is intact to conversational tone.    Sensation: Sensation is intact to light touch throughout Motor: Strength is at least antigravity x4. Tremors: none  DTR's 2/4 in [place] Cognitive Assessment  05/11/2020  Visuospatial/ Executive (0/5) 5   Naming (0/3) 3  Attention: Read list of digits (0/2) 2  Attention: Read list of letters (0/1) 1  Attention: Serial 7 subtraction starting at 100 (0/3) 3  Language: Repeat phrase (0/2) 2  Language : Fluency (0/1) 1  Abstraction (0/2) 2  Delayed Recall (0/5) 4  Orientation (0/6) 6  Total 29   No flowsheet data found.  No flowsheet data found.     Movement examination: Tone: There is normal tone in the UE/LE Abnormal movements:  no tremor.  No myoclonus.  No asterixis.   Coordination:  There is no decremation with RAM's. Normal finger to nose  Gait and Station: The patient has no difficulty arising out of a deep-seated chair without the use of the hands. The patient's stride length is good.  Gait is cautious and narrow.        Total time spent on today's visit was minutes, including both face-to-face time and nonface-to-face time. Time included that spent on review of records (prior notes available to me/labs/imaging if pertinent), discussing treatment and goals, answering patient's questions and coordinating care.  Cc:  [place], [place], [place]

## 2020-11-13 ENCOUNTER — Telehealth: Payer: Self-pay | Admitting: *Deleted

## 2020-11-13 NOTE — Telephone Encounter (Signed)
Prior Authorization for SPLIT NIGHT sent to [facility]  via PHONE. (PROCEDURE IS NOT AUTHORIZED - CASE REQUIRES CLINICAL REVIEW.

## 2020-11-13 NOTE — Telephone Encounter (Signed)
-----   Message from Antonieta Iba, RN sent at 11/04/2020  2:26 PM EDT ----- Split night sleep study has been ordered.  To be done at [facility].  Thanks!

## 2020-11-17 NOTE — Telephone Encounter (Signed)
APPROVED: Prior Authorization for split night sent to [facility] via web portal. [employer] Number [id number].

## 2020-12-11 ENCOUNTER — Encounter: Payer: Medicare HMO | Admitting: Cardiology

## 2020-12-14 NOTE — Telephone Encounter (Signed)
Patient is scheduled for lab study on 12/11/20. Patient understands hIS sleep study will be done at [facility] sleep lab. Patient understands he will receive a sleep packet in a week or so. Patient understands to call if he does not receive the sleep packet in a timely manner.

## 2021-01-12 ENCOUNTER — Telehealth: Payer: Self-pay

## 2021-01-12 NOTE — Telephone Encounter (Signed)
Letter has been sent to patient instructing them to call us if they are still interested in completing their sleep study. If we have not received a response from the patient within 30 days of this notice, the order will be cancelled and they will need to discuss the need for a sleep study at their next office visit.  ° °

## 2021-01-14 ENCOUNTER — Ambulatory Visit (INDEPENDENT_AMBULATORY_CARE_PROVIDER_SITE_OTHER): Payer: Medicare HMO

## 2021-01-14 ENCOUNTER — Other Ambulatory Visit: Payer: Self-pay

## 2021-01-14 ENCOUNTER — Ambulatory Visit: Payer: Medicare HMO | Admitting: Podiatry

## 2021-01-14 DIAGNOSIS — I890 Lymphedema, not elsewhere classified: Secondary | ICD-10-CM | POA: Insufficient documentation

## 2021-01-14 DIAGNOSIS — M722 Plantar fascial fibromatosis: Secondary | ICD-10-CM | POA: Diagnosis not present

## 2021-01-14 DIAGNOSIS — I499 Cardiac arrhythmia, unspecified: Secondary | ICD-10-CM | POA: Insufficient documentation

## 2021-01-14 DIAGNOSIS — R419 Unspecified symptoms and signs involving cognitive functions and awareness: Secondary | ICD-10-CM | POA: Insufficient documentation

## 2021-01-14 DIAGNOSIS — M7732 Calcaneal spur, left foot: Secondary | ICD-10-CM | POA: Diagnosis not present

## 2021-01-14 DIAGNOSIS — E538 Deficiency of other specified B group vitamins: Secondary | ICD-10-CM | POA: Insufficient documentation

## 2021-01-14 DIAGNOSIS — E559 Vitamin D deficiency, unspecified: Secondary | ICD-10-CM | POA: Insufficient documentation

## 2021-01-14 DIAGNOSIS — Z7901 Long term (current) use of anticoagulants: Secondary | ICD-10-CM | POA: Insufficient documentation

## 2021-01-14 DIAGNOSIS — H9311 Tinnitus, right ear: Secondary | ICD-10-CM | POA: Insufficient documentation

## 2021-01-14 DIAGNOSIS — F1020 Alcohol dependence, uncomplicated: Secondary | ICD-10-CM | POA: Insufficient documentation

## 2021-01-14 DIAGNOSIS — Z6841 Body Mass Index (BMI) 40.0 and over, adult: Secondary | ICD-10-CM | POA: Insufficient documentation

## 2021-01-14 DIAGNOSIS — H9192 Unspecified hearing loss, left ear: Secondary | ICD-10-CM | POA: Insufficient documentation

## 2021-01-14 NOTE — Patient Instructions (Signed)

## 2021-01-17 NOTE — Progress Notes (Signed)
Subjective:   Patient ID: Raymond Hurst, male   DOB: 70 y.o.   MRN: [id number]   HPI 70 year old male presents the office today for concerns of left heel pain which is been ongoing for about 1 month.  States this started after he did a lot of walking but no specific injury that he reports.  He states that very sore in the bottom of the heel and describes a burning discomfort, dull ache.  He states he had no pain yesterday and actually canceled the appointment but he had some pain this morning.  He states that when he bends his foot up is what he feels in his heel.  He has not had any recent treatment.  He has no other concerns today.   Review of Systems  All other systems reviewed and are negative.  Past Medical History:  Diagnosis Date   Atrial fibrillation ([place])    Cellulitis of right lower leg    History of TIA (transient ischemic attack)    May 2020   Lymphedema     Past Surgical History:  Procedure Laterality Date   INNER EAR SURGERY     ORTHOPEDIC SURGERY       Current Outpatient Medications:    doxycycline (VIBRAMYCIN) 100 MG capsule, 1 capsule, Disp: , Rfl:    mupirocin ointment (BACTROBAN) 2 %, 1 application, Disp: , Rfl:    terbinafine (LAMISIL) 250 MG tablet, 1 tablet, Disp: , Rfl:    apixaban (ELIQUIS) 5 MG TABS tablet, Take 1 tablet (5 mg total) by mouth 2 (two) times daily., Disp: 180 tablet, Rfl: 3   Cholecalciferol (VITAMIN D) 50 MCG (2000 UT) tablet, 2 tablet, Disp: , Rfl:    Cyanocobalamin (VITAMIN B 12 PO), Take by mouth., Disp: , Rfl:    furosemide (LASIX) 40 MG tablet, Take 1 tablet (40 mg total) by mouth daily., Disp: 5 tablet, Rfl: 0   ibuprofen (ADVIL) 200 MG tablet, Take 200 mg by mouth every 6 (six) hours as needed., Disp: , Rfl:    meloxicam (MOBIC) 15 MG tablet, 1 tablet, Disp: , Rfl:    metoprolol tartrate (LOPRESSOR) 25 MG tablet, Take 1 tablet (25 mg total) by mouth 2 (two) times daily., Disp: 180 tablet, Rfl: 3   silver sulfADIAZINE (SILVADENE) 1  % cream, Apply 1 application topically daily. Clean site of wound and apply cream., Disp: 100 g, Rfl: 0   VITAMIN D PO, Take by mouth., Disp: , Rfl:   No Known Allergies       Objective:  Physical Exam  General: AAO x3, NAD  Dermatological: The nails appear to be dystrophic with yellow discoloration.  Vascular: Dorsalis Pedis artery and Posterior Tibial artery pedal pulses are 2/4 bilateral with immedate capillary fill time. There is no pain with calf compression, swelling, warmth, erythema.   Neruologic: Grossly intact via light touch bilateral.  Sensation intact with Semmes Weinstein monofilament.  Negative Tinel sign.  Musculoskeletal: There is tenderness palpation on plantar medial tubercle of the calcaneus at the insertion of plantar fashion on the left side.  There is no pain with lateral compression of calcaneus.  There is no pain along the Achilles tendon.  No other areas of discomfort identified. Muscular strength 5/5 in all groups tested bilateral.  Gait: Unassisted, Nonantalgic.       Assessment:   70 year old male with left heel pain, Plantar fasciitis     Plan:  -Treatment options discussed including all alternatives, risks, and complications -Etiology of symptoms  were discussed -X-rays were obtained and reviewed with the patient.  There is no evidence of acute fracture or stress fracture.  Inferior calcaneal spurring is present. -Discussed to Eliquis would recommend holding off on oral anti-inflammatories.  We discussed stretching, icing daily.  We discussed changing shoes and he is going to go work on getting new shoes as well.  Discussed arch support as well.  Discussed topical medications that he can use to help with the discomfort as well.  Trula Slade DPM

## 2021-04-13 DIAGNOSIS — M1712 Unilateral primary osteoarthritis, left knee: Secondary | ICD-10-CM | POA: Diagnosis not present

## 2021-05-25 DIAGNOSIS — Z1211 Encounter for screening for malignant neoplasm of colon: Secondary | ICD-10-CM | POA: Diagnosis not present

## 2021-05-25 DIAGNOSIS — I4891 Unspecified atrial fibrillation: Secondary | ICD-10-CM | POA: Diagnosis not present

## 2021-05-25 DIAGNOSIS — I700 Atherosclerosis of aorta: Secondary | ICD-10-CM | POA: Diagnosis not present

## 2021-05-25 DIAGNOSIS — F99 Mental disorder, not otherwise specified: Secondary | ICD-10-CM | POA: Diagnosis not present

## 2021-05-25 DIAGNOSIS — Z23 Encounter for immunization: Secondary | ICD-10-CM | POA: Diagnosis not present

## 2021-05-25 DIAGNOSIS — S91001A Unspecified open wound, right ankle, initial encounter: Secondary | ICD-10-CM | POA: Diagnosis not present

## 2021-05-25 DIAGNOSIS — Z0000 Encounter for general adult medical examination without abnormal findings: Secondary | ICD-10-CM | POA: Diagnosis not present

## 2021-05-25 DIAGNOSIS — Z125 Encounter for screening for malignant neoplasm of prostate: Secondary | ICD-10-CM | POA: Diagnosis not present

## 2021-05-25 DIAGNOSIS — E785 Hyperlipidemia, unspecified: Secondary | ICD-10-CM | POA: Diagnosis not present

## 2021-05-25 DIAGNOSIS — Z7901 Long term (current) use of anticoagulants: Secondary | ICD-10-CM | POA: Diagnosis not present

## 2021-06-10 DIAGNOSIS — I890 Lymphedema, not elsewhere classified: Secondary | ICD-10-CM | POA: Diagnosis not present

## 2021-06-10 DIAGNOSIS — S91001D Unspecified open wound, right ankle, subsequent encounter: Secondary | ICD-10-CM | POA: Diagnosis not present

## 2021-06-10 DIAGNOSIS — H6121 Impacted cerumen, right ear: Secondary | ICD-10-CM | POA: Diagnosis not present

## 2021-08-17 ENCOUNTER — Telehealth: Payer: Self-pay | Admitting: Cardiology

## 2021-08-17 NOTE — Telephone Encounter (Signed)
Pt wife called stating that pt was supposed to have a splite night sleep study scheduled last visit and he is ready to schedule now. Please advise.

## 2021-08-17 NOTE — Telephone Encounter (Signed)
Returned call to wife. No answer. Left msg to call back.  

## 2021-08-18 NOTE — Telephone Encounter (Signed)
I sent Dr.Turner a staff message.Patient was documented as No show for sleep study 11/01/20    I have message Dr.Turner to see if she needs to see patient before, or, reorder test

## 2021-08-20 ENCOUNTER — Other Ambulatory Visit: Payer: Self-pay

## 2021-08-20 DIAGNOSIS — I4821 Permanent atrial fibrillation: Secondary | ICD-10-CM

## 2021-08-20 NOTE — Telephone Encounter (Signed)
Raymond Margarita, MD  Bernita Raisin, RN; Antonieta Iba, RN Order Itamar home sleep study

## 2021-08-20 NOTE — Telephone Encounter (Signed)
Sleep Apnea Evaluation  [facility]  Today's Date: 08/20/2021   Patient Name: Raymond Hurst        DOB: 1950-12-11       Height:        Weight:    BMI: There is no height or weight on file to calculate BMI.    Referring Provider:  Fransico Him   STOP-BANG RISK ASSESSMENT         If STOP-BANG Score ?3 OR two clinical symptoms - patient qualifies for WatchPAT (CPT 95800)      Sleep study ordered due to two (2) of the following clinical symptoms/diagnoses:  Excessive daytime sleepiness G47.10  Gastroesophageal reflux K21.9  Nocturia R35.1  Morning Headaches G44.221  Difficulty concentrating R41.840  Memory problems or poor judgment G31.84  Personality changes or irritability R45.4  Loud snoring R06.83  Depression F32.9  Unrefreshed by sleep G47.8  Impotence N52.9  History of high blood pressure R03.0  Insomnia G47.00  Sleep Disordered Breathing or Sleep Apnea ICD G47.33

## 2021-08-24 NOTE — Telephone Encounter (Signed)
Left a message for patient/spouse to call office back regarding Itamar Sleep Study.

## 2021-08-27 NOTE — Telephone Encounter (Signed)
Sleep Apnea Evaluation  [facility]  Today's Date: 08/27/2021   Patient Name: Raymond Hurst        DOB: Oct 07, 1950       Height:        Weight:    BMI: There is no height or weight on file to calculate BMI.    Referring Provider:  Fransico Him, MD   STOP-BANG RISK ASSESSMENT       08/20/2021   11:50 AM  STOP-BANG  Do you snore loudly? Yes  Do you often feel tired, fatigued, or sleepy during the daytime? Yes  Has anyone observed you stop breathing during sleep? No  Recent BMI (Calculated) 44.34  Is BMI greater than 35 kg/m2? 1=Yes  Age older than 69 years old? 1=Yes  Gender - Male 1=Yes      If STOP-BANG Score ?3 OR two clinical symptoms - patient qualifies for WatchPAT (CPT 95800)      Sleep study ordered due to two (2) of the following clinical symptoms/diagnoses:  Excessive daytime sleepiness G47.10  Gastroesophageal reflux K21.9  Nocturia R35.1  Morning Headaches G44.221  Difficulty concentrating R41.840  Memory problems or poor judgment G31.84  Personality changes or irritability R45.4  Loud snoring R06.83  Depression F32.9  Unrefreshed by sleep G47.8  Impotence N52.9  History of high blood pressure R03.0  Insomnia G47.00  Sleep Disordered Breathing or Sleep Apnea ICD G47.33

## 2021-08-27 NOTE — Telephone Encounter (Signed)
Wife was returning call. Please advise ?

## 2021-08-27 NOTE — Telephone Encounter (Signed)
Spoke to pt's spouse who verbalized interest in [facility] Sleep Study. Pt's spouse stated that she and pt can come to the office on Monday July 31st to pick up device and go over instructions.   Pt's Stop Bang Score: 7

## 2021-08-27 NOTE — Telephone Encounter (Signed)
Left message for pt to call office back regarding sleep study.

## 2021-08-30 ENCOUNTER — Ambulatory Visit (INDEPENDENT_AMBULATORY_CARE_PROVIDER_SITE_OTHER): Payer: Medicare HMO

## 2021-08-30 DIAGNOSIS — I4821 Permanent atrial fibrillation: Secondary | ICD-10-CM | POA: Diagnosis not present

## 2021-08-30 NOTE — Progress Notes (Signed)
Patient presented for pickup of Itamar Sleep Study device.

## 2021-09-01 DIAGNOSIS — M170 Bilateral primary osteoarthritis of knee: Secondary | ICD-10-CM | POA: Diagnosis not present

## 2021-09-02 NOTE — Telephone Encounter (Signed)
Prior Authorization for ITAMAR sent to [employer] via Phone. Reference # .  NO PA REQ 

## 2021-09-03 NOTE — Telephone Encounter (Signed)
Left a message for patient to call office regarding pin # for Raymond Hurst.

## 2021-09-06 NOTE — Telephone Encounter (Signed)
Pt's spouse notified and verbalized understanding. Pin number provided.

## 2021-09-07 ENCOUNTER — Encounter (INDEPENDENT_AMBULATORY_CARE_PROVIDER_SITE_OTHER): Payer: Medicare HMO | Admitting: Cardiology

## 2021-09-07 DIAGNOSIS — G4733 Obstructive sleep apnea (adult) (pediatric): Secondary | ICD-10-CM

## 2021-09-08 NOTE — Procedures (Signed)
   SLEEP STUDY REPORT Patient Information Study Date: 09/08/21 Patient Name: Raymond Hurst Patient ID: [id number] Birth Date: [birth year] Age: 71 Gender: Male BMI: 44.2 (W=326 lb, H=6' 0'') Referring Physician: Fransico Him, MD  TEST DESCRIPTION: Home sleep apnea testing was completed using the WatchPat, a Type 1 device, utilizing  peripheral arterial tonometry (PAT), chest movement, actigraphy, pulse oximetry, pulse rate, body position and snore.  AHI was calculated with apnea and hypopnea using valid sleep time as the denominator. RDI includes apneas,  hypopneas, and RERAs. The data acquired and the scoring of sleep and all associated events were performed in  accordance with the recommended standards and specifications as outlined in the AASM Manual for the Scoring of  Sleep and Associated Events 2.2.0 (2015).  FINDINGS: 1. Severe Obstructive Sleep Apnea with AHI 53.9/hr.  2. No Central Sleep Apnea with pAHIc 3.5/hr. 3. Oxygen desaturations as low as 70%. 4. Mild to moderate snoring was present. O2 sats were < 88% for 51.7 min. 5. Total sleep time was 5 hrs and 30 min. 6. 23% of total sleep time was spent in REM sleep.  7. Normal sleep onset latency at 17 min.  8. Shortened REM sleep onset latency at 35 min.  9. Total awakenings were 8.   DIAGNOSIS:  Severe Obstructive Sleep Apnea (G47.33) Nocturnal Hypoxemia  RECOMMENDATIONS: 1. Clinical correlation of these findings is necessary. The decision to treat obstructive sleep apnea (OSA) is usually  based on the presence of apnea symptoms or the presence of associated medical conditions such as Hypertension,  Congestive Heart Failure, Atrial Fibrillation or Obesity. The most common symptoms of OSA are snoring, gasping for  breath while sleeping, daytime sleepiness and fatigue.   2. Initiating apnea therapy is recommended given the presence of symptoms and/or associated conditions.  Recommend proceeding with one of the following:    a. Auto-CPAP therapy with a pressure range of 5-20cm H2O.   b. An oral appliance (OA) that can be obtained from certain dentists with expertise in sleep medicine. These are  primarily of use in non-obese patients with mild and moderate disease.   c. An ENT consultation which may be useful to look for specific causes of obstruction and possible treatment  options.   d. If patient is intolerant to PAP therapy, consider referral to ENT for evaluation for hypoglossal nerve stimulator.   3. Close follow-up is necessary to ensure success with CPAP or oral appliance therapy for maximum benefit .  4. A follow-up oximetry study on CPAP is recommended to assess the adequacy of therapy and determine the need  for supplemental oxygen or the potential need for Bi-level therapy. An arterial blood gas to determine the adequacy of  baseline ventilation and oxygenation should also be considered.  5. Healthy sleep recommendations include: adequate nightly sleep (normal 7-9 hrs/night), avoidance of caffeine after  noon and alcohol near bedtime, and maintaining a sleep environment that is cool, dark and quiet.  6. Weight loss for overweight patients is recommended. Even modest amounts of weight loss can significantly  improve the severity of sleep apnea.  7. Snoring recommendations include: weight loss where appropriate, side sleeping, and avoidance of alcohol before  bed.  8. Operation of motor vehicle should be avoided when sleepy.  Signature: Fransico Him, MD; [facility]; [place], [place] Board of Sleep  Medicine Electronically Signed: 09/08/21

## 2021-09-09 ENCOUNTER — Telehealth: Payer: Self-pay | Admitting: Cardiology

## 2021-09-09 NOTE — Telephone Encounter (Signed)
Pt's wife calling about sleep study results- not resulted to [place] office. Assured pt's wife that someone would give her a call regarding results when available.

## 2021-09-09 NOTE — Telephone Encounter (Signed)
Pt's wife is calling in regards to sleep study done. Requesting call back.

## 2021-09-09 NOTE — Telephone Encounter (Signed)
Left a message for patient to call office back regarding sleep study.

## 2021-09-16 ENCOUNTER — Telehealth: Payer: Self-pay | Admitting: *Deleted

## 2021-09-16 DIAGNOSIS — G4733 Obstructive sleep apnea (adult) (pediatric): Secondary | ICD-10-CM

## 2021-09-16 NOTE — Telephone Encounter (Signed)
The patient has been notified of the result. Left detailed message on voicemail and informed patient to call back.,  [place], CMA 06/10/2021 12:08 PM     

## 2021-09-16 NOTE — Telephone Encounter (Signed)
-----   Message from Lauralee Evener, [place] sent at 09/08/2021  1:18 PM EDT -----  ----- Message ----- From: Sueanne Margarita, MD Sent: 09/08/2021  10:04 AM EDT To: Cv Div Sleep Studies  Please let patient know that they have sleep apnea.  Recommend therapeutic CPAP titration for treatment of patient's sleep disordered breathing.  If unable to perform an in lab titration then initiate ResMed auto CPAP from 4 to 15cm H2O with heated humidity and mask of choice and overnight pulse ox on CPAP.

## 2021-09-20 NOTE — Telephone Encounter (Addendum)
8/21  NOT APPROVED-RECOMMEND ALTERNATIVE STUDY OPTIONS ARE.. (IN LAB TITRATION STUDY) DO A PEER- TO- PEER [phone]) OR WITHDRAW YOUR REQUEST. 8/17 Procedure Tracking #[id number]- CASE REQUIRES CLINICAL REVIEW.

## 2021-10-01 ENCOUNTER — Telehealth: Payer: Self-pay | Admitting: *Deleted

## 2021-10-11 NOTE — Addendum Note (Signed)
Addended by: Freada Bergeron on: 10/11/2021 05:15 PM   Modules accepted: Orders, Level of Service

## 2021-10-11 NOTE — Telephone Encounter (Signed)
This encounter was created in error - please disregard.

## 2021-10-18 ENCOUNTER — Ambulatory Visit: Payer: Medicare HMO | Attending: Internal Medicine

## 2021-10-18 DIAGNOSIS — I4821 Permanent atrial fibrillation: Secondary | ICD-10-CM

## 2021-10-28 DIAGNOSIS — M1712 Unilateral primary osteoarthritis, left knee: Secondary | ICD-10-CM | POA: Diagnosis not present

## 2021-11-03 NOTE — Telephone Encounter (Signed)
APPROVED: Oct 20 2021 - Jan 18 2022-AUTH# [id number].

## 2021-11-04 DIAGNOSIS — M1712 Unilateral primary osteoarthritis, left knee: Secondary | ICD-10-CM | POA: Diagnosis not present

## 2021-11-10 ENCOUNTER — Ambulatory Visit: Payer: Medicare HMO | Attending: Cardiology | Admitting: Cardiology

## 2021-11-10 DIAGNOSIS — G4733 Obstructive sleep apnea (adult) (pediatric): Secondary | ICD-10-CM | POA: Diagnosis not present

## 2021-11-10 DIAGNOSIS — I4891 Unspecified atrial fibrillation: Secondary | ICD-10-CM | POA: Insufficient documentation

## 2021-11-10 DIAGNOSIS — I493 Ventricular premature depolarization: Secondary | ICD-10-CM | POA: Diagnosis not present

## 2021-11-11 DIAGNOSIS — M1712 Unilateral primary osteoarthritis, left knee: Secondary | ICD-10-CM | POA: Diagnosis not present

## 2021-11-11 NOTE — Procedures (Signed)
   Patient Name: Raymond Hurst, Raymond Hurst Date: 11/10/2021 Gender: Male D.O.B: Jun 22, 1950 Age (years): 71 Referring Provider: Fransico Him MD, ABSM Height (inches): 72 Interpreting Physician: Fransico Him MD, ABSM Weight (lbs): 330 RPSGT: Rosebud Poles BMI: 45 MRN: [id number] Neck Size: 20.00  CLINICAL INFORMATION The patient is referred for a CPAP titration to treat sleep apnea.  SLEEP STUDY TECHNIQUE As per the AASM Manual for the Scoring of Sleep and Associated Events v2.3 (April 2016) with a hypopnea requiring 4% desaturations.  The channels recorded and monitored were frontal, central and occipital EEG, electrooculogram (EOG), submentalis EMG (chin), nasal and oral airflow, thoracic and abdominal wall motion, anterior tibialis EMG, snore microphone, electrocardiogram, and pulse oximetry. Continuous positive airway pressure (CPAP) was initiated at the beginning of the study and titrated to treat sleep-disordered breathing.  MEDICATIONS Medications self-administered by patient taken the night of the study : N/A  TECHNICIAN COMMENTS Comments added by technician: Patient tolerated CPAP therapy very well in lateral position. Titration began at 4 CWP and increased to 7 CWP, due to events and snoring. Patient was asked to try to sleep in supine position, patient unable to obtain sleep in supine positon. Suboptimal pressure obtained, due to REM - supine stage was not observed Comments added by scorer: N/A  RESPIRATORY PARAMETERS Optimal PAP Pressure (cm): 7  AHI at Optimal Pressure (/hr):0.9 Overall Minimal O2 (%):82.00  Supine % at Optimal Pressure (%):N/A Minimal O2 at Optimal Pressure (%): 86.00   SLEEP ARCHITECTURE The study was initiated at 10:51:17 PM and ended at 5:37:48 AM.  Sleep onset time was 27.8 minutes and the sleep efficiency was 65.6%. The total sleep time was 266.5 minutes.  The patient spent 3.38% of the night in stage N1 sleep, 52.35% in stage N2 sleep, 34.90% in  stage N3 and 9.4% in REM.Stage REM latency was 142.5 minutes  Wake after sleep onset was 112.3. Alpha intrusion was absent. Supine sleep was 0.00%.  CARDIAC DATA The 2 lead EKG demonstrated atrial fibrillation. The mean heart rate was 82.81 beats per minute. Other EKG findings include:  PVCs.  LEG MOVEMENT DATA The total Periodic Limb Movements of Sleep (PLMS) were 0. The PLMS index was 0.00. A PLMS index of <15 is considered normal in adults.  IMPRESSIONS - An optimal PAP pressure of 9cm H2O was selected for this patient based on the available study data. - Moderate oxygen desaturations were observed during this titration (min O2 = 82.00%). - The patient snored with moderate snoring volume during this titration study. - 2-lead EKG demonstrated: Atrial Fibrillation, PVCs - Clinically significant periodic limb movements were not noted during this study. Arousals associated with PLMs were rare.  DIAGNOSIS - Obstructive Sleep Apnea (G47.33) - Atrial Fibrillation  RECOMMENDATIONS - Recommend a trial of auto CPAP from 4 to 15cm H2O with heated humidity and mask of choice. - Avoid alcohol, sedatives and other CNS depressants that may worsen sleep apnea and disrupt normal sleep architecture. - Sleep hygiene should be reviewed to assess factors that may improve sleep quality. - Weight management and regular exercise should be initiated or continued. - Return to Sleep Center for re-evaluation after 6 weeks of therapy  [Electronically signed] 11/11/2021 08:28 AM  Fransico Him MD, ABSM Diplomate, American Board of Sleep Medicine

## 2021-11-17 ENCOUNTER — Encounter: Payer: Self-pay | Admitting: *Deleted

## 2021-11-17 ENCOUNTER — Encounter: Payer: Self-pay | Admitting: Student

## 2021-11-17 ENCOUNTER — Ambulatory Visit: Payer: Medicare HMO | Attending: Student | Admitting: Student

## 2021-11-17 VITALS — BP 122/80 | HR 83 | Ht 72.0 in | Wt 338.0 lb

## 2021-11-17 DIAGNOSIS — I4821 Permanent atrial fibrillation: Secondary | ICD-10-CM

## 2021-11-17 DIAGNOSIS — G4733 Obstructive sleep apnea (adult) (pediatric): Secondary | ICD-10-CM

## 2021-11-17 DIAGNOSIS — Z7901 Long term (current) use of anticoagulants: Secondary | ICD-10-CM | POA: Diagnosis not present

## 2021-11-17 DIAGNOSIS — I890 Lymphedema, not elsewhere classified: Secondary | ICD-10-CM

## 2021-11-17 MED ORDER — METOPROLOL TARTRATE 25 MG PO TABS: 25.0000 mg | ORAL_TABLET | Freq: Two times a day (BID) | ORAL | 3 refills | Status: DC

## 2021-11-17 MED ORDER — FUROSEMIDE 40 MG PO TABS: 40.0000 mg | ORAL_TABLET | Freq: Every day | ORAL | 3 refills | Status: DC

## 2021-11-17 NOTE — Progress Notes (Signed)
Cardiology Office Note    Date:  11/17/2021   ID:  Raymond Hurst, DOB [birth year], MRN [id number]  PCP:  [place], [place]  Cardiologist: Rozann Lesches, MD    Chief Complaint  Patient presents with   Follow-up    Annual Visit    History of Present Illness:    Raymond Hurst is a 71 y.o. male with past medical history of permanent atrial fibrillation, lymphedema and prior TIA who presents to the office today for annual follow-up.  He was previously followed by Dr. Domenic Polite but last examined by Dr. Radford Pax in 10/2020 and reported having chronic dyspnea in the setting of his obesity but denied any recent chest pain or palpitations. He did have chronic lower extremity edema and was using compression stockings daily along with taking Lasix 40 mg daily. He was continued on his current medical therapy and a sleep study was recommended for evaluation of OSA. His sleep study was performed and did show severe OSA and he did undergo a CPAP titration study earlier this month.  In talking with the patient today, he reports symptoms have overall been stable since his last office visit. 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 He reports his breathing has been stable and denies any recent orthopnea, PND, chest pain or palpitations. He is overall asymptomatic with his atrial fibrillation. He remains on Eliquis for anticoagulation and denies any recent melena, hematochezia or hematuria. He does have chronic lymphedema but reports symptoms have overall been stable and he does wear compression stockings daily along with taking Lasix 40 mg daily. Reports frequent urination with this.   Past Medical History:  Diagnosis Date   Atrial fibrillation ([place])    Cellulitis of right lower leg    History of TIA (transient ischemic attack)    May 2020   Lymphedema     Past Surgical  History:  Procedure Laterality Date   INNER EAR SURGERY     ORTHOPEDIC SURGERY      Current Medications: Outpatient Medications Prior to Visit  Medication Sig Dispense Refill   apixaban (ELIQUIS) 5 MG TABS tablet Take 1 tablet (5 mg total) by mouth 2 (two) times daily. 180 tablet 3   Cholecalciferol (VITAMIN D) 50 MCG (2000 UT) tablet 2 tablet     Cyanocobalamin (VITAMIN B 12 PO) Take by mouth.     ibuprofen (ADVIL) 200 MG tablet Take 200 mg by mouth every 6 (six) hours as needed.     silver sulfADIAZINE (SILVADENE) 1 % cream Apply 1 application topically daily. Clean site of wound and apply cream. (Patient taking differently: Apply 1 application  topically as needed. Clean site of wound and apply cream.) 100 g 0   doxycycline (VIBRAMYCIN) 100 MG capsule 1 capsule     furosemide (LASIX) 40 MG tablet Take 1 tablet (40 mg total) by mouth daily. 5 tablet 0   meloxicam (MOBIC) 15 MG tablet 1 tablet     metoprolol tartrate (LOPRESSOR) 25 MG tablet Take 1 tablet (25 mg total) by mouth 2 (two) times daily. 180 tablet 3   mupirocin ointment (BACTROBAN) 2 % 1 application     terbinafine (LAMISIL) 250 MG tablet 1 tablet     VITAMIN D PO Take by mouth.     No facility-administered medications prior to visit.     Allergies:   Patient has  no known allergies.   Social History   Socioeconomic History   Marital status: Married    Spouse name: Not on file   Number of children: Not on file   Years of education: Not on file   Highest education level: Not on file  Occupational History   Not on file  Tobacco Use   Smoking status: Former    Packs/day: 2.00    Years: 20.00    Total pack years: 40.00    Types: Cigarettes    Quit date: 01/31/1990    Years since quitting: 31.8   Smokeless tobacco: Never  Vaping Use   Vaping Use: Never used  Substance and Sexual Activity   Alcohol use: Yes    Comment: 4-5 beer daily   Drug use: No   Sexual activity: Not on file  Other Topics Concern   Not on  file  Social History Narrative   Right handed.    Social Determinants of Health   Financial Resource Strain: Not on file  Food Insecurity: Not on file  Transportation Needs: Not on file  Physical Activity: Not on file  Stress: Not on file  Social Connections: Not on file     Family History:  The patient's family history includes Atrial fibrillation in his mother; COPD in his mother; Heart failure in his mother; Idiopathic pulmonary fibrosis in his father.   Review of Systems:    Please see the history of present illness.     All other systems reviewed and are otherwise negative except as noted above.   Physical Exam:    VS:  BP 122/80   Pulse 83   Ht 6' (1.829 m)   Wt (!) 338 lb (153.3 kg)   SpO2 95%   BMI 45.84 kg/m    General: Pleasant, obese male appearing in no acute distress. Head: Normocephalic, atraumatic. Neck: No carotid bruits. JVD not elevated.  Lungs: Respirations regular and unlabored, without wheezes or rales.  Heart: Irregularly irregular. No S3 or S4.  No murmur, no rubs, or gallops appreciated. Abdomen: Appears non-distended. No obvious abdominal masses. Msk:  Strength and tone appear normal for age. No obvious joint deformities or effusions. Extremities: No clubbing or cyanosis. Chronic appearing lymphedema.  Distal pedal pulses are 2+ bilaterally. Neuro: Alert and oriented X 3. Moves all extremities spontaneously. No focal deficits noted. Psych:  Responds to questions appropriately with a normal affect. Skin: No rashes or lesions noted  Wt Readings from Last 3 Encounters:  11/17/21 (!) 338 lb (153.3 kg)  11/04/20 (!) 327 lb (148.3 kg)  05/11/20 (!) 329 lb (149.2 kg)    Studies/Labs Reviewed:   EKG:  EKG is ordered today. The ekg ordered today demonstrates rate-controlled atrial fibrillation, HR 83 with LAFB and no acute ST changes.   Recent Labs: No results found for requested labs within last 365 days.   Lipid Panel    Component Value  Date/Time   CHOL 129 06/08/2018 0634   TRIG 84 06/08/2018 0634   HDL 31 (L) 06/08/2018 0634   CHOLHDL 4.2 06/08/2018 0634   VLDL 17 06/08/2018 0634   LDLCALC 81 06/08/2018 0634    Additional studies/ records that were reviewed today include:   Echocardiogram: 05/2019 IMPRESSIONS     1. Left ventricular ejection fraction, by estimation, is 55 to 60%. The  left ventricle has normal function. The left ventricle has no regional  wall motion abnormalities. There is mild left ventricular hypertrophy.  Left ventricular diastolic parameters  are indeterminate.   2. Right ventricular systolic function is normal. The right ventricular  size is normal.   3. The mitral valve is normal in structure. Trivial mitral valve  regurgitation. No evidence of mitral stenosis.   4. The aortic valve has an indeterminant number of cusps. Aortic valve  regurgitation is not visualized. No aortic stenosis is present.   5. Aortic dilatation noted. There is mild to moderate dilatation of the  aortic root measuring 43 mm.   6. Indeterminant PASP, inadequate TR jet.   7. The inferior vena cava is normal in size with greater than 50%  respiratory variability, suggesting right atrial pressure of 3 mmHg.   Assessment:    1. Permanent atrial fibrillation ([place])   2. Current use of long term anticoagulation   3. Lymphedema   4. OSA (obstructive sleep apnea)      Plan:   In order of problems listed above:  1. Permanent Atrial Fibrillation/Long-term Anticoagulation - He denies any recent palpitations and his heart rate is well-controlled in the 80's today. He says this has also been well-controlled when checked at home. Remains on Lopressor '25mg'$  BID for rate-control.  - On Eliquis '5mg'$  BID for anticoagulation which is the appropriate dose at this time given his age, weight and renal function. No reports of active bleeding. Will request a copy of most recent labs from his PCP. He is in the donut hole and  patient assistance forms were provided.   2. Lymphedema - He reports symptoms have overall been well controlled with current diuretic therapy and the use of compression stockings. Continue with Lasix '40mg'$  daily. He did have recent labs with his PCP and we will request a copy of these to make sure a BMET was obtained for assessment of electrolytes and kidney function.  3. OSA - Recent sleep study showed severe OSA and he did undergo a CPAP titration study earlier this month and is awaiting to hear back in regards to his CPAP. Being followed by Dr. Radford Pax.    Medication Adjustments/Labs and Tests Ordered: Current medicines are reviewed at length with the patient today.  Concerns regarding medicines are outlined above.  Medication changes, Labs and Tests ordered today are listed in the Patient Instructions below. Patient Instructions  Medication Instructions:  Your physician recommends that you continue on your current medications as directed. Please refer to the Current Medication list given to you today.  *If you need a refill on your cardiac medications before your next appointment, please call your pharmacy*   Lab Work: NONE   If you have labs (blood work) drawn today and your tests are completely normal, you will receive your results only by: [place] (if you have MyChart) OR A paper copy in the mail If you have any lab test that is abnormal or we need to change your treatment, we will call you to review the results.   Testing/Procedures: NONE    Follow-Up: At [facility], you and your health needs are our priority.  As part of our continuing mission to provide you with exceptional heart care, we have created designated Provider Care Teams.  These Care Teams include your primary Cardiologist (physician) and Advanced Practice Providers (APPs -  Physician Assistants and Nurse Practitioners) who all work together to provide you with the care you need, when you need  it.  We recommend signing up for the patient portal called "MyChart".  Sign up information is provided on this After Visit Summary.  MyChart is used to connect with patients for Virtual Visits (Telemedicine).  Patients are able to view lab/test results, encounter notes, upcoming appointments, etc.  Non-urgent messages can be sent to your provider as well.   To learn more about what you can do with MyChart, go to [email or web address].    Your next appointment:   1 year(s)  The format for your next appointment:   In Person  Provider:   Rozann Lesches, MD    Other Instructions Thank you for choosing [facility]!    Important Information About Sugar         Signed, Erma Heritage, PA-C  11/17/2021 3:39 PM    [facility] [address] [place],  [address] Phone: [phone] Fax: [phone]

## 2021-11-17 NOTE — Patient Instructions (Signed)
Medication Instructions:  Your physician recommends that you continue on your current medications as directed. Please refer to the Current Medication list given to you today.  *If you need a refill on your cardiac medications before your next appointment, please call your pharmacy*   Lab Work: NONE   If you have labs (blood work) drawn today and your tests are completely normal, you will receive your results only by: [place] (if you have MyChart) OR A paper copy in the mail If you have any lab test that is abnormal or we need to change your treatment, we will call you to review the results.   Testing/Procedures: NONE    Follow-Up: At [facility], you and your health needs are our priority.  As part of our continuing mission to provide you with exceptional heart care, we have created designated Provider Care Teams.  These Care Teams include your primary Cardiologist (physician) and Advanced Practice Providers (APPs -  Physician Assistants and Nurse Practitioners) who all work together to provide you with the care you need, when you need it.  We recommend signing up for the patient portal called "MyChart".  Sign up information is provided on this After Visit Summary.  MyChart is used to connect with patients for Virtual Visits (Telemedicine).  Patients are able to view lab/test results, encounter notes, upcoming appointments, etc.  Non-urgent messages can be sent to your provider as well.   To learn more about what you can do with MyChart, go to [email or web address].    Your next appointment:   1 year(s)  The format for your next appointment:   In Person  Provider:   Rozann Lesches, MD    Other Instructions Thank you for choosing [facility]!    Important Information About Sugar

## 2021-11-18 ENCOUNTER — Encounter: Payer: Self-pay | Admitting: Family Medicine

## 2021-11-24 ENCOUNTER — Other Ambulatory Visit: Payer: Self-pay

## 2021-11-24 MED ORDER — APIXABAN 5 MG PO TABS: 5.0000 mg | ORAL_TABLET | Freq: Two times a day (BID) | ORAL | 1 refills | Status: DC

## 2021-11-24 NOTE — Telephone Encounter (Signed)
Prescription refill request for Eliquis received. Indication: AF Last office visit: 11/17/21  B Strader PA-C Scr: 0.88 on 05/25/21 Age: 71 Weight: 153.3kg  Based on above findings Eliquis '5mg'$  twice daily is the appropriate dose.  Refill approved.

## 2021-12-06 ENCOUNTER — Telehealth: Payer: Self-pay | Admitting: *Deleted

## 2021-12-06 DIAGNOSIS — G4733 Obstructive sleep apnea (adult) (pediatric): Secondary | ICD-10-CM

## 2021-12-06 NOTE — Telephone Encounter (Signed)
-----   Message from Lauralee Evener, [place] sent at 11/15/2021 11:42 AM EDT -----  ----- Message ----- From: Sueanne Margarita, MD Sent: 11/11/2021   8:30 AM EDT To: Cv Div Sleep Studies  Please let patient know that they had a successful PAP titration and let DME know that orders are in EPIC.  Please set up 6 week OV with me. Please get ONO on CPAP

## 2021-12-06 NOTE — Telephone Encounter (Addendum)
The patient has been notified of the result and verbalized understanding.  All questions (if any) were answered. Raymond Hurst, [place] 12/06/2021 12:40 PM    Upon patient request DME selection is [name]. Patient understands he will be contacted by [facility] to set up his cpap. Patient understands to call if [name] does not contact him with new setup in a timely manner. Patient understands they will be called once confirmation has been received from [facility]/ that they have received their new machine to schedule 10 week follow up appointment.  ONO order added   [facility] notified of new cpap order  Please add to airview Patient was grateful for the call and thanked me.

## 2021-12-06 NOTE — Addendum Note (Signed)
Addended by: Freada Bergeron on: 12/06/2021 12:56 PM   Modules accepted: Orders

## 2022-01-03 ENCOUNTER — Ambulatory Visit (INDEPENDENT_AMBULATORY_CARE_PROVIDER_SITE_OTHER): Payer: Medicare HMO

## 2022-01-03 ENCOUNTER — Ambulatory Visit
Admission: EM | Admit: 2022-01-03 | Discharge: 2022-01-03 | Disposition: A | Discharge status: Home / Self Care | Payer: Medicare HMO

## 2022-01-03 DIAGNOSIS — M25532 Pain in left wrist: Secondary | ICD-10-CM

## 2022-01-03 MED ORDER — PREDNISONE 20 MG PO TABS: 40.0000 mg | ORAL_TABLET | Freq: Every day | ORAL | 0 refills | Status: AC

## 2022-01-03 NOTE — ED Provider Notes (Signed)
[facility]    CSN: [id number] Arrival date & time: 01/03/22  1135      History   Chief Complaint Chief Complaint  Patient presents with   Wrist Pain    HPI Raymond Hurst is a 71 y.o. male.   Patient presents with with 3 days of left wrist and hand pain, redness, and swelling.  No recent injury, accident, fall, or trauma to the left wrist or hand.  Reports the pain is "on top" of his wrist.  Pain is severe and worse with touch.  Pain does not radiate to the fingertips.  Denies weakness, numbness, bruising, or fevers, nausea/vomiting since the pain began.  Reports it is difficult to make a fist because of the swelling.  No decreased sensation or decreased strength in the hand.  Has taken Tylenol and use diclofenac gel.  Reports the diclofenac gel helped the 1 day and then did not help anymore.  Patient has been wearing a wrist brace and reports that this helped significantly.     Past Medical History:  Diagnosis Date   Atrial fibrillation ([place])    Cellulitis of right lower leg    History of TIA (transient ischemic attack)    May 2020   Lymphedema     Patient Active Problem List   Diagnosis Date Noted   Alcoholism ([place]) 01/14/2021   Body mass index (BMI) 40.0-44.9, adult ([place]) 01/14/2021   Cardiac arrhythmia 01/14/2021   Hearing loss of left ear 01/14/2021   Long term (current) use of anticoagulants 01/14/2021   Lymphedema 01/14/2021   Neurocognitive disorder 01/14/2021   Tinnitus of right ear 01/14/2021   Vitamin B12 deficiency (non anemic) 01/14/2021   Vitamin D deficiency 01/14/2021   DOE (dyspnea on exertion) 03/25/2020   Encounter for removal of sutures 12/24/2019   Dizziness 06/08/2018   TIA (transient ischemic attack) 06/08/2018   Unspecified atrial fibrillation ([place]) 06/08/2018   Near syncope 06/07/2018   Hypokalemia 06/07/2018   Chronic suppurative otitis media of left ear 04/04/2018   Presbycusis of both ears 04/04/2018   Unilateral  deafness, left 04/04/2018   Subjective tinnitus of both ears 06/02/2015   Lymphedema of leg 04/08/2013   Cellulitis of right lower leg 04/04/2013   Morbid obesity due to excess calories ([place]) 04/04/2013   Alcohol abuse 04/04/2013   Cellulitis and abscess of leg 04/04/2013    Past Surgical History:  Procedure Laterality Date   INNER EAR SURGERY     ORTHOPEDIC SURGERY         Home Medications    Prior to Admission medications   Medication Sig Start Date End Date Taking? Authorizing Provider  acetaminophen (TYLENOL) 500 MG tablet Take 500 mg by mouth every 6 (six) hours as needed.   Yes [provider]  predniSONE (DELTASONE) 20 MG tablet Take 2 tablets (40 mg total) by mouth daily with breakfast for 5 days. 01/03/22 01/08/22 Yes Eulogio Bear, NP  apixaban (ELIQUIS) 5 MG TABS tablet Take 1 tablet (5 mg total) by mouth 2 (two) times daily. 11/24/21   Satira Sark, MD  Cholecalciferol (VITAMIN D) 50 MCG (2000 UT) tablet 2 tablet    [provider]  Cyanocobalamin (VITAMIN B 12 PO) Take by mouth.    [provider]  furosemide (LASIX) 40 MG tablet Take 1 tablet (40 mg total) by mouth daily. 11/17/21   Strader, Fransisco Hertz, PA-C  ibuprofen (ADVIL) 200 MG tablet Take 200 mg by mouth every 6 (six)  hours as needed.    [provider]  metoprolol tartrate (LOPRESSOR) 25 MG tablet Take 1 tablet (25 mg total) by mouth 2 (two) times daily. 11/17/21   Strader, Fransisco Hertz, PA-C  silver sulfADIAZINE (SILVADENE) 1 % cream Apply 1 application topically daily. Clean site of wound and apply cream. Patient taking differently: Apply 1 application  topically as needed. Clean site of wound and apply cream. 07/21/20   Scot Jun, FNP    Family History Family History  Problem Relation Age of Onset   Heart failure Mother    COPD Mother    Atrial fibrillation Mother    Idiopathic pulmonary fibrosis Father     Social History Social History    Tobacco Use   Smoking status: Former    Packs/day: 2.00    Years: 20.00    Total pack years: 40.00    Types: Cigarettes    Quit date: 01/31/1990    Years since quitting: 31.9   Smokeless tobacco: Never  Vaping Use   Vaping Use: Never used  Substance Use Topics   Alcohol use: Yes    Comment: 4-5 beer daily   Drug use: No     Allergies   Patient has no known allergies.   Review of Systems Review of Systems Per HPI  Physical Exam Triage Vital Signs ED Triage Vitals  Enc Vitals Group     BP 01/03/22 1404 (!) 163/90     Pulse Rate 01/03/22 1404 86     Resp 01/03/22 1404 20     Temp 01/03/22 1404 98.4 F (36.9 C)     Temp Source 01/03/22 1404 Oral     SpO2 01/03/22 1404 94 %     Weight --      Height --      Head Circumference --      Peak Flow --      Pain Score 01/03/22 1405 9     Pain Loc --      Pain Edu? --      Excl. in [place]? --    No data found.  Updated Vital Signs BP (!) 163/90 (BP Location: Right Arm)   Pulse 86   Temp 98.4 F (36.9 C) (Oral)   Resp 20   SpO2 94%   Visual Acuity Right Eye Distance:   Left Eye Distance:   Bilateral Distance:    Right Eye Near:   Left Eye Near:    Bilateral Near:     Physical Exam Vitals and nursing note reviewed.  Constitutional:      General: He is not in acute distress.    Appearance: Normal appearance. He is not toxic-appearing.  Pulmonary:     Effort: Pulmonary effort is normal. No respiratory distress.  Musculoskeletal:     Right lower leg: No edema.     Left lower leg: No edema.     Comments: Inspection: Mild swelling and erythema of the left wrist extending into dorsal aspect of left hand.  Swelling and erythema stopped prior to MCP joints.  No obvious deformity appreciated.   Palpation: Left wrist tender to palpation diffusely; no obvious deformities palpated ROM: Active range of motion decreased secondary to swelling Strength: 5/5 difficult to assess secondary to swelling Neurovascular:  neurovascularly intact in left upper  Skin:    General: Skin is warm and dry.     Capillary Refill: Capillary refill takes less than 2 seconds.     Coloration: Skin is not jaundiced or pale.  Findings: No erythema.  Neurological:     Mental Status: He is alert and oriented to person, place, and time.  Psychiatric:        Behavior: Behavior is cooperative.      UC Treatments / Results  Labs (all labs ordered are listed, but only abnormal results are displayed) Labs Reviewed - No data to display  EKG   Radiology DG Wrist Complete Left  Result Date: 01/03/2022 CLINICAL DATA:  Left wrist pain with redness and swelling. EXAM: LEFT WRIST - COMPLETE 3+ VIEW COMPARISON:  None Available. FINDINGS: Negative for acute fracture or dislocation in left hand. There appears to be mild soft tissue swelling. Normal alignment. IMPRESSION: 1. No acute bone abnormality to the left hand. Electronically Signed   By: Markus Daft M.D.   On: 01/03/2022 14:58    Procedures Procedures (including critical care time)  Medications Ordered in UC Medications - No data to display  Initial Impression / Assessment and Plan / UC Course  I have reviewed the triage vital signs and the nursing notes.  Pertinent labs & imaging results that were available during my care of the patient were reviewed by me and considered in my medical decision making (see chart for details).   Patient is well-appearing, normotensive, afebrile, not tachycardic, not tachypneic, oxygenating well on room air.   Left wrist pain X-ray imaging today negative for acute bony abnormality Suspect gout attack Treat with prednisone 40 mg daily for 5 days Other supportive care discussed, can continue Tylenol, wrist bracing as needed Follow-up with orthopedic provider if not significantly improved with prednisone  The patient was given the opportunity to ask questions.  All questions answered to their satisfaction.  The patient is in  agreement to this plan.    Final Clinical Impressions(s) / UC Diagnoses   Final diagnoses:  Left wrist pain     Discharge Instructions      X-ray today does not show any acute bony abnormality.  I suspect you are having a gout flare.  Please start the prednisone 40 mg daily for 5 days to treat this.  You can also use the wrist brace, elevated your wrist, and use ice to help with swelling and pain.  Can also take Tylenol 500 mg every 6 hours as needed for pain.  Follow up with [facility] if pain does not improve later this week.      ED Prescriptions     Medication Sig Dispense Auth. Provider   predniSONE (DELTASONE) 20 MG tablet Take 2 tablets (40 mg total) by mouth daily with breakfast for 5 days. 10 tablet Eulogio Bear, NP      PDMP not reviewed this encounter.   Eulogio Bear, NP 01/03/22 1759

## 2022-01-03 NOTE — ED Triage Notes (Signed)
Pt reports pain and swelling in left hand and left wrist x 3 days. Pt taking Tylenol and Diclofenac gel.

## 2022-01-03 NOTE — Discharge Instructions (Addendum)
X-ray today does not show any acute bony abnormality.  I suspect you are having a gout flare.  Please start the prednisone 40 mg daily for 5 days to treat this.  You can also use the wrist brace, elevated your wrist, and use ice to help with swelling and pain.  Can also take Tylenol 500 mg every 6 hours as needed for pain.  Follow up with [facility] if pain does not improve later this week.

## 2022-01-25 DIAGNOSIS — M25532 Pain in left wrist: Secondary | ICD-10-CM | POA: Diagnosis not present

## 2022-03-21 ENCOUNTER — Telehealth: Payer: Self-pay | Admitting: Cardiology

## 2022-03-21 MED ORDER — APIXABAN 5 MG PO TABS: 5.0000 mg | ORAL_TABLET | Freq: Two times a day (BID) | ORAL | 0 refills | Status: DC

## 2022-03-21 NOTE — Telephone Encounter (Signed)
Patient calling the office for samples of medication:   1.  What medication and dosage are you requesting samples for? apixaban (ELIQUIS) 5 MG TABS tablet   2.  Are you currently out of this medication? Only has one left. Requesting a return call. Will be out by tomorrow.

## 2022-03-21 NOTE — Telephone Encounter (Signed)
Patient wife states patient assistance form was dropped over approx 2 weeks ago and they are inquiring on status. Eliquis 5 mg samples # 56, lot [id number], EXP 07/2023 given

## 2022-04-05 ENCOUNTER — Telehealth: Payer: Self-pay | Admitting: Cardiology

## 2022-04-05 MED ORDER — APIXABAN 5 MG PO TABS: 5.0000 mg | ORAL_TABLET | Freq: Two times a day (BID) | ORAL | 5 refills | Status: DC

## 2022-04-05 NOTE — Telephone Encounter (Signed)
Prescription refill request for Eliquis received. Indication: AF Last office visit: 11/17/21  B Strader PA-C Scr: 0.88 on 11/18/21 Age: 72 Weight: 153.3kg  Based on above findings Eliquis '5mg'$  twice daily is the appropriate dose.  Refill approved.

## 2022-04-05 NOTE — Telephone Encounter (Signed)
*  STAT* If patient is at the pharmacy, call can be transferred to refill team.   1. Which medications need to be refilled? (please list name of each medication and dose if known)  apixaban (ELIQUIS) 5 MG TABS tablet  2. Which pharmacy/location (including street and city if local pharmacy) is medication to be sent to? [id number] [place], [place] - [address]. Do they need a 30 day or 90 day supply?  30 day supply

## 2022-04-19 DIAGNOSIS — I83213 Varicose veins of right lower extremity with both ulcer of ankle and inflammation: Secondary | ICD-10-CM | POA: Diagnosis not present

## 2022-04-19 DIAGNOSIS — I83891 Varicose veins of right lower extremities with other complications: Secondary | ICD-10-CM | POA: Diagnosis not present

## 2022-04-21 DIAGNOSIS — M1712 Unilateral primary osteoarthritis, left knee: Secondary | ICD-10-CM | POA: Diagnosis not present

## 2022-04-26 DIAGNOSIS — I83892 Varicose veins of left lower extremities with other complications: Secondary | ICD-10-CM | POA: Diagnosis not present

## 2022-05-10 ENCOUNTER — Telehealth: Payer: Self-pay | Admitting: *Deleted

## 2022-05-10 ENCOUNTER — Ambulatory Visit: Payer: Medicare HMO | Attending: Cardiology | Admitting: Cardiology

## 2022-05-10 ENCOUNTER — Encounter: Payer: Self-pay | Admitting: Cardiology

## 2022-05-10 VITALS — BP 137/60 | HR 64 | Ht 72.0 in | Wt 344.4 lb

## 2022-05-10 DIAGNOSIS — I4821 Permanent atrial fibrillation: Secondary | ICD-10-CM

## 2022-05-10 DIAGNOSIS — G4733 Obstructive sleep apnea (adult) (pediatric): Secondary | ICD-10-CM

## 2022-05-10 DIAGNOSIS — I890 Lymphedema, not elsewhere classified: Secondary | ICD-10-CM

## 2022-05-10 DIAGNOSIS — Z6841 Body Mass Index (BMI) 40.0 and over, adult: Secondary | ICD-10-CM

## 2022-05-10 NOTE — Progress Notes (Signed)
Cardiology Office Note  Date: 05/10/2022   ID: Raymond Hurst, DOB 1950-10-29, MRN [id number]  PCP:  [facility]  Cardiologist:  Nona Dell, MD Electrophysiologist:  None   Chief Complaint  Patient presents with   Atrial Fibrillation   Sleep Apnea    History of Present Illness: Raymond Hurst is a 72 y.o. male with a hx of permanent atrial fibrillation, TIA in 2020 and lymphedema.  At last office visit I ordered a sleep study due to snoring and morbid obesity.  Sleep study showed severe obstructive sleep apnea with an AHI of 54/h with no significant central events.  He had nocturnal hypoxemia with O2 sat nadir of 70%.  He was referred for CPAP titration and titrated to 7cm H2O and was placed ultimately on auto CPAP from 4 to 15 cm H2O.  He is here today for followup and is doing well.  He denies any chest pain or pressure, , PND, orthopnea,dizziness, palpitations or syncope. He has chronic DOE with extreme exertion but otherwise stable\.  He has chronic LE edema from Lymphedema and is seeing [facility] specialist. He is compliant with his meds and is tolerating meds with no SE.    He is doing well with his PAP device.  He uses a full face mask and then balloons his cheeks out because of the higher pressure needed and he stops the device to reset it.  He has been having mouth and nasal dryness but has not tried to adjust the humidity and sometimes he does not even put water in the chamber.    Past Medical History:  Diagnosis Date   Atrial fibrillation    Cellulitis of right lower leg    History of TIA (transient ischemic attack)    May 2020   Lymphedema    OSA on CPAP    severe obstructive sleep apnea with an AHI of 54/h with no significant central events.  He had nocturnal hypoxemia with O2 sat nadir of 70%. Now on CPAP    Past Surgical History:  Procedure Laterality Date   INNER EAR SURGERY     ORTHOPEDIC SURGERY      Current Outpatient  Medications  Medication Sig Dispense Refill   acetaminophen (TYLENOL) 500 MG tablet Take 500 mg by mouth every 6 (six) hours as needed.     apixaban (ELIQUIS) 5 MG TABS tablet Take 1 tablet (5 mg total) by mouth 2 (two) times daily. 56 tablet 0   apixaban (ELIQUIS) 5 MG TABS tablet Take 1 tablet (5 mg total) by mouth 2 (two) times daily. 60 tablet 5   Cholecalciferol (VITAMIN D) 50 MCG (2000 UT) tablet 2 tablet     Cyanocobalamin (VITAMIN B 12 PO) Take by mouth.     furosemide (LASIX) 40 MG tablet Take 1 tablet (40 mg total) by mouth daily. 90 tablet 3   ibuprofen (ADVIL) 200 MG tablet Take 200 mg by mouth every 6 (six) hours as needed.     metoprolol tartrate (LOPRESSOR) 25 MG tablet Take 1 tablet (25 mg total) by mouth 2 (two) times daily. 180 tablet 3   silver sulfADIAZINE (SILVADENE) 1 % cream Apply 1 application topically daily. Clean site of wound and apply cream. (Patient not taking: Reported on 05/10/2022) 100 g 0   No current facility-administered medications for this visit.   Allergies:  Patient has no known allergies.   ROS: No frank syncope.  Physical Exam: VS:  BP  137/60 (BP Location: Right Arm, Patient Position: Sitting, Cuff Size: Normal)   Pulse 64   Ht 6' (1.829 m)   Wt (!) 344 lb 6.4 oz (156.2 kg)   BMI 46.71 kg/m , BMI Body mass index is 46.71 kg/m.  Wt Readings from Last 3 Encounters:  05/10/22 (!) 344 lb 6.4 oz (156.2 kg)  11/17/21 (!) 338 lb (153.3 kg)  11/04/20 (!) 327 lb (148.3 kg)  GEN: Well nourished, well developed in no acute distress HEENT: Normal NECK: No JVD; No carotid bruits LYMPHATICS: No lymphadenopathy CARDIAC:irregularly irregular, no murmurs, rubs, gallops RESPIRATORY:  Clear to auscultation without rales, wheezing or rhonchi  ABDOMEN: Soft, non-tender, non-distended MUSCULOSKELETAL:  No edema; No deformity  SKIN: Warm and dry NEUROLOGIC:  Alert and oriented x 3 PSYCHIATRIC:  Normal affect    ECG:  An ECG was personally reviewed today and  demonstrated: Atrial fibrillation with CVR  Recent Labwork: No results found for requested labs within last 365 days.     Component Value Date/Time   CHOL 129 06/08/2018 0634   TRIG 84 06/08/2018 0634   HDL 31 (L) 06/08/2018 0634   CHOLHDL 4.2 06/08/2018 0634   VLDL 17 06/08/2018 0634   LDLCALC 81 06/08/2018 0634    Other Studies Reviewed Today:  Echocardiogram 05/06/2019: 1. Left ventricular ejection fraction, by estimation, is 55 to 60%. The  left ventricle has normal function. The left ventricle has no regional  wall motion abnormalities. There is mild left ventricular hypertrophy.  Left ventricular diastolic parameters  are indeterminate.   2. Right ventricular systolic function is normal. The right ventricular  size is normal.   3. The mitral valve is normal in structure. Trivial mitral valve  regurgitation. No evidence of mitral stenosis.   4. The aortic valve has an indeterminant number of cusps. Aortic valve  regurgitation is not visualized. No aortic stenosis is present.   5. Aortic dilatation noted. There is mild to moderate dilatation of the  aortic root measuring 43 mm.   6. Indeterminant PASP, inadequate TR jet.   7. The inferior vena cava is normal in size with greater than 50%  respiratory variability, suggesting right atrial pressure of 3 mmHg.   Lower extremity arterial Dopplers 09/06/2019: IMPRESSION: Ultrasound demonstrates no segmental occlusion or high-grade stenosis.  Assessment and Plan:   Permanent atrial fibrillation -He remains in atrial for patient with -No recent palpitations -No complaints of bleeding on DOAC -CHADS2VASC score of 3 (age>65 and TIA) -Continue prescription drug management with Eliquis 5 mg twice daily and Lopressor 25 mg twice daily with as needed refills -I have personally reviewed and interpreted outside labs performed by patient's PCP showing  Serum creatinine 0.93 potassium 4.4 and hemoglobin 14.9 on 01/25/2022  2.   Lymphedema -this is chronic and very stable on diuretics -Continue prescription drug management with Lasix 40 mg daily with as needed refills  3.  OSA - The PAP download performed by his DME was personally reviewed and interpreted by me today and showed an AHI of 13.4 /hr on auto CPAP from 4-15 cm H2O with 80% compliance in using more than 4 hours nightly.  The patient has been using and benefiting from PAP use and will continue to benefit from therapy.  -he is not tolerating the device because he thinks the pressure is way too high and the mask is leaking -suspect his AHI is elevated due to mask leak -he initially was titrated to 7cm H2O but is now on auto  PAP which I think is too much for him -I will change him back to a set CPAP at 7cm H2O and get a download in 4 weeks- -he has a lot of facial hair which I think may be also contributing to his mask leak and encouraged him to at least try to shave a closer to his skin   Medication Adjustments/Labs and Tests Ordered: Current medicines are reviewed at length with the patient today.  Concerns regarding medicines are outlined above.   Tests Ordered: No orders of the defined types were placed in this encounter.    Medication Changes: No orders of the defined types were placed in this encounter.    Disposition:  Follow up virtual in 3 months  Signed, Jonelle Sidle, MD, [facility] 05/10/2022 12:16 PM    [facility] at [facility] [address] [place], [place], [place] [address] Phone: [phone]; Fax: [phone]

## 2022-05-10 NOTE — Patient Instructions (Signed)
Medication Instructions:  Your physician recommends that you continue on your current medications as directed. Please refer to the Current Medication list given to you today.  *If you need a refill on your cardiac medications before your next appointment, please call your pharmacy*   Lab Work: None.  If you have labs (blood work) drawn today and your tests are completely normal, you will receive your results only by: MyChart Message (if you have MyChart) OR A paper copy in the mail If you have any lab test that is abnormal or we need to change your treatment, we will call you to review the results.   Testing/Procedures: None.   Follow-Up: At [facility], you and your health needs are our priority.  As part of our continuing mission to provide you with exceptional heart care, we have created designated Provider Care Teams.  These Care Teams include your primary Cardiologist (physician) and Advanced Practice Providers (APPs -  Physician Assistants and Nurse Practitioners) who all work together to provide you with the care you need, when you need it.  We recommend signing up for the patient portal called "MyChart".  Sign up information is provided on this After Visit Summary.  MyChart is used to connect with patients for Virtual Visits (Telemedicine).  Patients are able to view lab/test results, encounter notes, upcoming appointments, etc.  Non-urgent messages can be sent to your provider as well.   To learn more about what you can do with MyChart, go to [email or web address].    Your next appointment:   3 month(s)  Provider:   Dr. Armanda Magic, MD

## 2022-05-10 NOTE — Telephone Encounter (Signed)
Order placed to [facility] to change him to a set CPAP at 7cm H2O and get a download in 4 weeks-

## 2022-05-11 DIAGNOSIS — I83891 Varicose veins of right lower extremities with other complications: Secondary | ICD-10-CM | POA: Diagnosis not present

## 2022-05-11 DIAGNOSIS — L97319 Non-pressure chronic ulcer of right ankle with unspecified severity: Secondary | ICD-10-CM | POA: Diagnosis not present

## 2022-05-11 DIAGNOSIS — I83013 Varicose veins of right lower extremity with ulcer of ankle: Secondary | ICD-10-CM | POA: Diagnosis not present

## 2022-05-11 DIAGNOSIS — I890 Lymphedema, not elsewhere classified: Secondary | ICD-10-CM | POA: Diagnosis not present

## 2022-05-18 DIAGNOSIS — I890 Lymphedema, not elsewhere classified: Secondary | ICD-10-CM | POA: Diagnosis not present

## 2022-05-18 DIAGNOSIS — I83213 Varicose veins of right lower extremity with both ulcer of ankle and inflammation: Secondary | ICD-10-CM | POA: Diagnosis not present

## 2022-05-24 DIAGNOSIS — Z6841 Body Mass Index (BMI) 40.0 and over, adult: Secondary | ICD-10-CM | POA: Diagnosis not present

## 2022-05-24 DIAGNOSIS — I700 Atherosclerosis of aorta: Secondary | ICD-10-CM | POA: Diagnosis not present

## 2022-05-24 DIAGNOSIS — E785 Hyperlipidemia, unspecified: Secondary | ICD-10-CM | POA: Diagnosis not present

## 2022-05-24 DIAGNOSIS — F1020 Alcohol dependence, uncomplicated: Secondary | ICD-10-CM | POA: Diagnosis not present

## 2022-05-24 DIAGNOSIS — E790 Hyperuricemia without signs of inflammatory arthritis and tophaceous disease: Secondary | ICD-10-CM | POA: Diagnosis not present

## 2022-05-24 DIAGNOSIS — Z125 Encounter for screening for malignant neoplasm of prostate: Secondary | ICD-10-CM | POA: Diagnosis not present

## 2022-05-24 DIAGNOSIS — I4891 Unspecified atrial fibrillation: Secondary | ICD-10-CM | POA: Diagnosis not present

## 2022-05-24 DIAGNOSIS — Z0000 Encounter for general adult medical examination without abnormal findings: Secondary | ICD-10-CM | POA: Diagnosis not present

## 2022-05-24 DIAGNOSIS — Z1211 Encounter for screening for malignant neoplasm of colon: Secondary | ICD-10-CM | POA: Diagnosis not present

## 2022-05-24 DIAGNOSIS — D6869 Other thrombophilia: Secondary | ICD-10-CM | POA: Diagnosis not present

## 2022-05-24 DIAGNOSIS — R7309 Other abnormal glucose: Secondary | ICD-10-CM | POA: Diagnosis not present

## 2022-05-25 DIAGNOSIS — I872 Venous insufficiency (chronic) (peripheral): Secondary | ICD-10-CM | POA: Diagnosis not present

## 2022-06-14 DIAGNOSIS — I83891 Varicose veins of right lower extremities with other complications: Secondary | ICD-10-CM | POA: Diagnosis not present

## 2022-06-14 DIAGNOSIS — Z09 Encounter for follow-up examination after completed treatment for conditions other than malignant neoplasm: Secondary | ICD-10-CM | POA: Diagnosis not present

## 2022-07-04 DIAGNOSIS — M7989 Other specified soft tissue disorders: Secondary | ICD-10-CM | POA: Diagnosis not present

## 2022-07-04 DIAGNOSIS — I83811 Varicose veins of right lower extremities with pain: Secondary | ICD-10-CM | POA: Diagnosis not present

## 2022-07-04 DIAGNOSIS — I890 Lymphedema, not elsewhere classified: Secondary | ICD-10-CM | POA: Diagnosis not present

## 2022-07-04 DIAGNOSIS — I83891 Varicose veins of right lower extremities with other complications: Secondary | ICD-10-CM | POA: Diagnosis not present

## 2022-07-11 DIAGNOSIS — I890 Lymphedema, not elsewhere classified: Secondary | ICD-10-CM | POA: Diagnosis not present

## 2022-07-19 DIAGNOSIS — I83891 Varicose veins of right lower extremities with other complications: Secondary | ICD-10-CM | POA: Diagnosis not present

## 2022-07-21 ENCOUNTER — Inpatient Hospital Stay (HOSPITAL_COMMUNITY)
Admission: EM | Admit: 2022-07-21 | Discharge: 2022-07-23 | DRG: 378 | Disposition: A | Discharge status: Home / Self Care | Payer: Medicare HMO | Attending: Student | Admitting: Student

## 2022-07-21 ENCOUNTER — Other Ambulatory Visit: Payer: Self-pay

## 2022-07-21 ENCOUNTER — Encounter (HOSPITAL_COMMUNITY): Payer: Self-pay

## 2022-07-21 ENCOUNTER — Emergency Department (HOSPITAL_COMMUNITY): Payer: Medicare HMO

## 2022-07-21 DIAGNOSIS — I890 Lymphedema, not elsewhere classified: Secondary | ICD-10-CM | POA: Diagnosis present

## 2022-07-21 DIAGNOSIS — D62 Acute posthemorrhagic anemia: Secondary | ICD-10-CM | POA: Diagnosis not present

## 2022-07-21 DIAGNOSIS — Z79899 Other long term (current) drug therapy: Secondary | ICD-10-CM

## 2022-07-21 DIAGNOSIS — T45515A Adverse effect of anticoagulants, initial encounter: Secondary | ICD-10-CM | POA: Diagnosis present

## 2022-07-21 DIAGNOSIS — Z8673 Personal history of transient ischemic attack (TIA), and cerebral infarction without residual deficits: Secondary | ICD-10-CM

## 2022-07-21 DIAGNOSIS — D6832 Hemorrhagic disorder due to extrinsic circulating anticoagulants: Secondary | ICD-10-CM | POA: Diagnosis not present

## 2022-07-21 DIAGNOSIS — M109 Gout, unspecified: Secondary | ICD-10-CM

## 2022-07-21 DIAGNOSIS — Z6841 Body Mass Index (BMI) 40.0 and over, adult: Secondary | ICD-10-CM | POA: Diagnosis not present

## 2022-07-21 DIAGNOSIS — Z7901 Long term (current) use of anticoagulants: Secondary | ICD-10-CM

## 2022-07-21 DIAGNOSIS — D3501 Benign neoplasm of right adrenal gland: Secondary | ICD-10-CM | POA: Diagnosis present

## 2022-07-21 DIAGNOSIS — Z825 Family history of asthma and other chronic lower respiratory diseases: Secondary | ICD-10-CM

## 2022-07-21 DIAGNOSIS — Z8249 Family history of ischemic heart disease and other diseases of the circulatory system: Secondary | ICD-10-CM

## 2022-07-21 DIAGNOSIS — K922 Gastrointestinal hemorrhage, unspecified: Secondary | ICD-10-CM | POA: Diagnosis present

## 2022-07-21 DIAGNOSIS — I480 Paroxysmal atrial fibrillation: Secondary | ICD-10-CM | POA: Diagnosis present

## 2022-07-21 DIAGNOSIS — E278 Other specified disorders of adrenal gland: Secondary | ICD-10-CM | POA: Diagnosis not present

## 2022-07-21 DIAGNOSIS — G4733 Obstructive sleep apnea (adult) (pediatric): Secondary | ICD-10-CM

## 2022-07-21 DIAGNOSIS — K5791 Diverticulosis of intestine, part unspecified, without perforation or abscess with bleeding: Secondary | ICD-10-CM | POA: Insufficient documentation

## 2022-07-21 DIAGNOSIS — Z9989 Dependence on other enabling machines and devices: Secondary | ICD-10-CM

## 2022-07-21 DIAGNOSIS — K5731 Diverticulosis of large intestine without perforation or abscess with bleeding: Principal | ICD-10-CM | POA: Diagnosis present

## 2022-07-21 DIAGNOSIS — K219 Gastro-esophageal reflux disease without esophagitis: Secondary | ICD-10-CM | POA: Diagnosis not present

## 2022-07-21 DIAGNOSIS — K5730 Diverticulosis of large intestine without perforation or abscess without bleeding: Secondary | ICD-10-CM | POA: Diagnosis not present

## 2022-07-21 DIAGNOSIS — K921 Melena: Secondary | ICD-10-CM | POA: Diagnosis not present

## 2022-07-21 DIAGNOSIS — Z87891 Personal history of nicotine dependence: Secondary | ICD-10-CM | POA: Diagnosis not present

## 2022-07-21 LAB — CBC
HCT: 43.9 % (ref 39.0–52.0)
HCT: 42.4 % (ref 39.0–52.0)
Hemoglobin: 14.7 g/dL (ref 13.0–17.0)
Hemoglobin: 14.4 g/dL (ref 13.0–17.0)
MCH: 33.2 pg (ref 26.0–34.0)
MCH: 33.3 pg (ref 26.0–34.0)
MCHC: 33.5 g/dL (ref 30.0–36.0)
MCHC: 34 g/dL (ref 30.0–36.0)
MCV: 99.1 fL (ref 80.0–100.0)
MCV: 97.9 fL (ref 80.0–100.0)
Platelets: 195 10*3/uL (ref 150–400)
Platelets: 179 10*3/uL (ref 150–400)
RBC: 4.43 MIL/uL (ref 4.22–5.81)
RBC: 4.33 MIL/uL (ref 4.22–5.81)
RDW: 13.3 % (ref 11.5–15.5)
RDW: 13.2 % (ref 11.5–15.5)
WBC: 8.5 10*3/uL (ref 4.0–10.5)
WBC: 8.4 10*3/uL (ref 4.0–10.5)
nRBC: 0 % (ref 0.0–0.2)
nRBC: 0 % (ref 0.0–0.2)

## 2022-07-21 LAB — COMPREHENSIVE METABOLIC PANEL
ALT: 17 U/L (ref 0–44)
AST: 20 U/L (ref 15–41)
Albumin: 3.2 g/dL — ABNORMAL LOW (ref 3.5–5.0)
Alkaline Phosphatase: 42 U/L (ref 38–126)
Anion gap: 7 (ref 5–15)
BUN: 9 mg/dL (ref 8–23)
CO2: 25 mmol/L (ref 22–32)
Calcium: 8.8 mg/dL — ABNORMAL LOW (ref 8.9–10.3)
Chloride: 102 mmol/L (ref 98–111)
Creatinine, Ser: 1.04 mg/dL (ref 0.61–1.24)
GFR, Estimated: >60 mL/min (ref >60)
Glucose, Bld: 75 mg/dL (ref 70–99)
Potassium: 3.9 mmol/L (ref 3.5–5.1)
Sodium: 134 mmol/L — ABNORMAL LOW (ref 135–145)
Total Bilirubin: 0.7 mg/dL (ref 0.3–1.2)
Total Protein: 6.9 g/dL (ref 6.5–8.1)

## 2022-07-21 LAB — TYPE AND SCREEN
ABO/RH(D): O NEG
Antibody Screen: NEGATIVE

## 2022-07-21 MED ORDER — IOHEXOL 350 MG/ML SOLN
100.0000 mL | Freq: Once | INTRAVENOUS | Status: AC | PRN
  Administered 2022-07-21: 100 mL via INTRAVENOUS

## 2022-07-21 MED ORDER — MAGNESIUM HYDROXIDE 400 MG/5ML PO SUSP: 30.0000 mL | Freq: Every day | ORAL | Status: DC | PRN

## 2022-07-21 MED ORDER — ACETAMINOPHEN 650 MG RE SUPP: 650.0000 mg | Freq: Four times a day (QID) | RECTAL | Status: DC | PRN

## 2022-07-21 MED ORDER — VITAMIN B-12 100 MCG PO TABS
250.0000 ug | ORAL_TABLET | Freq: Every day | ORAL | Status: DC
  Administered 2022-07-22 – 2022-07-23 (×2): 250 ug via ORAL
  Filled 2022-07-21 (×2): qty 3

## 2022-07-21 MED ORDER — TRAZODONE HCL 50 MG PO TABS: 25.0000 mg | ORAL_TABLET | Freq: Every evening | ORAL | Status: DC | PRN

## 2022-07-21 MED ORDER — ONDANSETRON HCL 4 MG/2ML IJ SOLN: 4.0000 mg | Freq: Four times a day (QID) | INTRAMUSCULAR | Status: DC | PRN

## 2022-07-21 MED ORDER — SODIUM CHLORIDE 0.9 % IV SOLN: INTRAVENOUS | Status: DC

## 2022-07-21 MED ORDER — METOPROLOL TARTRATE 25 MG PO TABS
25.0000 mg | ORAL_TABLET | Freq: Two times a day (BID) | ORAL | Status: DC
  Administered 2022-07-22 – 2022-07-23 (×4): 25 mg via ORAL
  Filled 2022-07-21 (×4): qty 1

## 2022-07-21 MED ORDER — ONDANSETRON HCL 4 MG PO TABS: 4.0000 mg | ORAL_TABLET | Freq: Four times a day (QID) | ORAL | Status: DC | PRN

## 2022-07-21 MED ORDER — ACETAMINOPHEN 325 MG PO TABS: 650.0000 mg | ORAL_TABLET | Freq: Four times a day (QID) | ORAL | Status: DC | PRN

## 2022-07-21 MED ORDER — VITAMIN D 25 MCG (1000 UNIT) PO TABS
4000.0000 [IU] | ORAL_TABLET | Freq: Every day | ORAL | Status: DC
  Administered 2022-07-22 – 2022-07-23 (×2): 4000 [IU] via ORAL
  Filled 2022-07-21 (×2): qty 4

## 2022-07-21 NOTE — ED Triage Notes (Addendum)
Pt came in via POV d/t dark bloody stools since 1600 today. Does take Eliquis d/t Hx of A-Fib. Does report feeling light headed, denies abd pain, weakness, SOB or CP.

## 2022-07-21 NOTE — H&P (Addendum)
[place]   PATIENT NAME: Raymond Hurst    MR#:  [id number]  DATE OF BIRTH:  24-Apr-1950  DATE OF ADMISSION:  07/21/2022  PRIMARY CARE PHYSICIAN: [facility]   Patient is coming from: Home  REQUESTING/REFERRING PHYSICIAN: Chase Caller, MD   CHIEF COMPLAINT:   Chief Complaint  Patient presents with   Rectal Bleeding   On Eliquis    HISTORY OF PRESENT ILLNESS:  JUNIE ENGRAM is a 72 y.o. male with medical history significant for atrial fibrillation on Eliquis, TIA, lymphedema and OSA on CPAP, who presented to emergency room with acute onset of recurrent lower GI bleeding with 7 bloody bowel movements.  He admitted to mild lightheadedness this afternoon.  No presyncope or syncope.  No nausea or vomiting or abdominal pain or heartburn.  No fever or chills.  He  stated his stools were dark red as well.  No chest pain or palpitations.  No cough or wheezing or hemoptysis.  No dysuria, oliguria or hematuria or flank pain.  No other bleeding diathesis.  ED Course: When he came to the ER, vital signs were within normal.  Labs revealed unremarkable BMP.  CBC was initially within normal.   EKG as reviewed by me : None Imaging: CT angio of the abdomen and pelvis revealed the following: VASCULAR   No acute vascular abnormality is noted. No findings to suggest active GI hemorrhage are seen.   NON-VASCULAR   6.9 cm fatty lesion in the right adrenal gland consistent with myelolipoma. Surgical consultation is recommended. Biochemical evaluation may be helpful as well.   Diverticulosis without diverticulitis. Again, no active GI hemorrhage is noted.  The patient will be admitted to a medical telemetry bed for further evaluation and management. PAST MEDICAL HISTORY:   Past Medical History:  Diagnosis Date   Atrial fibrillation (HCC)    Cellulitis of right lower leg    History of TIA (transient ischemic attack)    May 2020   Lymphedema    OSA on  CPAP    severe obstructive sleep apnea with an AHI of 54/h with no significant central events.  He had nocturnal hypoxemia with O2 sat nadir of 70%. Now on CPAP    PAST SURGICAL HISTORY:   Past Surgical History:  Procedure Laterality Date   INNER EAR SURGERY     ORTHOPEDIC SURGERY      SOCIAL HISTORY:   Social History   Tobacco Use   Smoking status: Former    Packs/day: 2.00    Years: 20.00    Additional pack years: 0.00    Total pack years: 40.00    Types: Cigarettes    Quit date: 01/31/1990    Years since quitting: 32.4   Smokeless tobacco: Never  Substance Use Topics   Alcohol use: Yes    Comment: 4-5 beer daily    FAMILY HISTORY:   Family History  Problem Relation Age of Onset   Heart failure Mother    COPD Mother    Atrial fibrillation Mother    Idiopathic pulmonary fibrosis Father     DRUG ALLERGIES:  No Known Allergies  REVIEW OF SYSTEMS:   ROS As per history of present illness. All pertinent systems were reviewed above. Constitutional, HEENT, cardiovascular, respiratory, GI, GU, musculoskeletal, neuro, psychiatric, endocrine, integumentary and hematologic systems were reviewed and are otherwise negative/unremarkable except for positive findings mentioned above in the HPI.   MEDICATIONS AT HOME:   Prior  to Admission medications   Medication Sig Start Date End Date Taking? Authorizing Provider  acetaminophen (TYLENOL) 500 MG tablet Take 500 mg by mouth every 6 (six) hours as needed.    [provider]  apixaban (ELIQUIS) 5 MG TABS tablet Take 1 tablet (5 mg total) by mouth 2 (two) times daily. 03/21/22   Jonelle Sidle, MD  apixaban (ELIQUIS) 5 MG TABS tablet Take 1 tablet (5 mg total) by mouth 2 (two) times daily. 04/05/22   Jonelle Sidle, MD  Cholecalciferol (VITAMIN D) 50 MCG (2000 UT) tablet 2 tablet    [provider]  Cyanocobalamin (VITAMIN B 12 PO) Take by mouth.    [provider]  furosemide (LASIX) 40 MG tablet  Take 1 tablet (40 mg total) by mouth daily. 11/17/21   Strader, Lennart Pall, PA-C  ibuprofen (ADVIL) 200 MG tablet Take 200 mg by mouth every 6 (six) hours as needed.    [provider]  metoprolol tartrate (LOPRESSOR) 25 MG tablet Take 1 tablet (25 mg total) by mouth 2 (two) times daily. 11/17/21   Strader, Lennart Pall, PA-C  silver sulfADIAZINE (SILVADENE) 1 % cream Apply 1 application topically daily. Clean site of wound and apply cream. Patient not taking: Reported on 05/10/2022 07/21/20   Bing Neighbors, NP      VITAL SIGNS:  Blood pressure 125/83, pulse (!) 106, temperature 97.9 F (36.6 C), resp. rate 18, height 6' (1.829 m), weight (!) 145.2 kg, SpO2 99 %.  PHYSICAL EXAMINATION:  Physical Exam  GENERAL:  72 y.o.-year-old Caucasian male patient lying in the bed with no acute distress.  EYES: Pupils equal, round, reactive to light and accommodation. No scleral icterus. Extraocular muscles intact.  HEENT: Head atraumatic, normocephalic. Oropharynx and nasopharynx clear.  NECK:  Supple, no jugular venous distention. No thyroid enlargement, no tenderness.  LUNGS: Normal breath sounds bilaterally, no wheezing, rales,rhonchi or crepitation. No use of accessory muscles of respiration.  CARDIOVASCULAR: Regular rate and rhythm, S1, S2 normal. No murmurs, rubs, or gallops.  ABDOMEN: Soft, nondistended, nontender. Bowel sounds present. No organomegaly or mass.  EXTREMITIES: He has chronic bilateral lower extremity lymphedema, cyanosis, or clubbing.  I NEUROLOGIC: Cranial nerves II through XII are intact. Muscle strength 5/5 in all extremities. Sensation intact. Gait not checked.  PSYCHIATRIC: The patient is alert and oriented x 3.  Normal affect and good eye contact. SKIN: Has right onychomycosis with otherwise no obvious rash, lesion, or ulcer.   LABORATORY PANEL:   CBC Recent Labs  Lab 07/22/22 0010  WBC 6.9  HGB 12.4*  HCT 37.4*  PLT 167    ------------------------------------------------------------------------------------------------------------------  Chemistries  Recent Labs  Lab 07/21/22 1859 07/22/22 0010  NA 134* 136  K 3.9 4.5  CL 102 102  CO2 25 25  GLUCOSE 75 107*  BUN 9 12  CREATININE 1.04 1.00  CALCIUM 8.8* 8.8*  AST 20  --   ALT 17  --   ALKPHOS 42  --   BILITOT 0.7  --    ------------------------------------------------------------------------------------------------------------------  Cardiac Enzymes No results for input(s): "TROPONINI" in the last 168 hours. ------------------------------------------------------------------------------------------------------------------  RADIOLOGY:  CT ANGIO GI BLEED  Result Date: 07/21/2022 CLINICAL DATA:  Dark bloody stools for several hours, initial encounter EXAM: CTA ABDOMEN AND PELVIS WITHOUT AND WITH CONTRAST TECHNIQUE: Multidetector CT imaging of the abdomen and pelvis was performed using the standard protocol during bolus administration of intravenous contrast. Multiplanar reconstructed images and MIPs were obtained and reviewed to evaluate  the vascular anatomy. RADIATION DOSE REDUCTION: This exam was performed according to the departmental dose-optimization program which includes automated exposure control, adjustment of the mA and/or kV according to patient size and/or use of iterative reconstruction technique. CONTRAST:  OMNIPAQUE IOHEXOL 350 MG/ML SOLN COMPARISON:  None Available. FINDINGS: VASCULAR Aorta: Atherosclerotic calcifications are noted without aneurysmal dilatation or dissection. Celiac: Patent without evidence of aneurysm, dissection, vasculitis or significant stenosis. SMA: Patent without evidence of aneurysm, dissection, vasculitis or significant stenosis. Renals: Both renal arteries are patent without evidence of aneurysm, dissection, vasculitis, fibromuscular dysplasia or significant stenosis. IMA: Patent without evidence of aneurysm,  dissection, vasculitis or significant stenosis. Inflow: Iliacs demonstrate atherosclerotic calcifications without aneurysmal dilatation. Veins: No significant venous abnormality is noted. Review of the MIP images confirms the above findings. NON-VASCULAR Lower chest: No acute abnormality. Hepatobiliary: No focal liver abnormality is seen. No gallstones, gallbladder wall thickening, or biliary dilatation. Pancreas: Unremarkable. No pancreatic ductal dilatation or surrounding inflammatory changes. Spleen: Normal in size without focal abnormality. Adrenals/Urinary Tract: Left adrenal gland is within normal limits. Right adrenal gland is significantly enlarged secondary to a 6.9 cm fatty lesion consistent with myelolipoma. Kidneys demonstrate cystic change bilaterally. No specific follow-up is recommended. Delayed images demonstrate normal enhancement. No obstructive changes are seen. The bladder is partially distended. Small lateral diverticulum is noted on the right. Stomach/Bowel: Diverticulosis without evidence of diverticulitis. No areas of active extravasation are identified to suggest acute GI hemorrhage. Stomach and small bowel are within normal limits. Lymphatic: No specific lymphadenopathy is noted. Reproductive: Prostate is unremarkable. Other: No abdominal wall hernia or abnormality. No abdominopelvic ascites. Musculoskeletal: Compression deformity at L1 is noted with increased kyphosis. This appears chronic in nature. No acute bony abnormality is noted. IMPRESSION: VASCULAR No acute vascular abnormality is noted. No findings to suggest active GI hemorrhage are seen. NON-VASCULAR 6.9 cm fatty lesion in the right adrenal gland consistent with myelolipoma. Surgical consultation is recommended. Biochemical evaluation may be helpful as well. Diverticulosis without diverticulitis. Again, no active GI hemorrhage is noted. Electronically Signed   By: Alcide Clever M.D.   On: 07/21/2022 22:57      IMPRESSION AND  PLAN:  Assessment and Plan: * Lower GI bleeding - The patient will be admitted to a medical telemetry bed. - We will follow serial hemoglobins and hematocrits. - Given recent dark stools we will place him for now on IV Protonix. - GI consult to be obtained. - Dr.Steven Armbruster was notified about the patient and is aware.  Paroxysmal atrial fibrillation (HCC) - His Eliquis will be held off. - We will continue Lopressor.  OSA on CPAP Will resume CPAP at bedtime.  Gout - We will continue allopurinol.   DVT prophylaxis: SCDs. Advanced Care Planning:  Code Status: full code.  Family Communication:  The plan of care was discussed in details with the patient (and family). I answered all questions. The patient agreed to proceed with the above mentioned plan. Further management will depend upon hospital course. Disposition Plan: Back to previous home environment Consults called: GI consult All the records are reviewed and case discussed with ED provider.  Status is: Inpatient    At the time of the admission, it appears that the appropriate admission status for this patient is inpatient.  This is judged to be reasonable and necessary in order to provide the required intensity of service to ensure the patient's safety given the presenting symptoms, physical exam findings and initial radiographic and laboratory data in the  context of comorbid conditions.  The patient requires inpatient status due to high intensity of service, high risk of further deterioration and high frequency of surveillance required.  I certify that at the time of admission, it is my clinical judgment that the patient will require inpatient hospital care extending more than 2 midnights.                            Dispo: The patient is from: Home              Anticipated d/c is to: Home              Patient currently is not medically stable to d/c.              Difficult to place patient: No  Hannah Beat M.D on  07/22/2022 at 3:58 AM  [facility]   From 7 PM-7 AM, contact night-coverage [email or web address]  CC: Primary care physician; [facility]

## 2022-07-21 NOTE — ED Notes (Signed)
Pt ambulated to bathroom independently

## 2022-07-21 NOTE — ED Notes (Signed)
Patient transported to CT 

## 2022-07-21 NOTE — ED Provider Notes (Signed)
[place] EMERGENCY DEPARTMENT AT [facility] Provider Note   HPI: Raymond Hurst is a 72 year old male with a past medical history as below including A-fib on Eliquis presenting today with rectal bleeding.  He has not had any abdominal pain, nausea, vomiting, diarrhea.  He reports that started today he endorses 4 bloody bowel movements that "filled the toilet bowl with blood."  He endorses some lightheadedness but no chest pain or shortness of breath.  He endorses compliance with his Eliquis and last took it this morning.  Due to persistence of his symptoms he presented to the emergency room.  He endorses he has had a similar episodes but never this heavy and they have resolved typically.  Past Medical History:  Diagnosis Date   Atrial fibrillation (HCC)    Cellulitis of right lower leg    History of TIA (transient ischemic attack)    May 2020   Lymphedema    OSA on CPAP    severe obstructive sleep apnea with an AHI of 54/h with no significant central events.  He had nocturnal hypoxemia with O2 sat nadir of 70%. Now on CPAP    Past Surgical History:  Procedure Laterality Date   INNER EAR SURGERY     ORTHOPEDIC SURGERY       Social History   Tobacco Use   Smoking status: Former    Packs/day: 2.00    Years: 20.00    Additional pack years: 0.00    Total pack years: 40.00    Types: Cigarettes    Quit date: 01/31/1990    Years since quitting: 32.4   Smokeless tobacco: Never  Vaping Use   Vaping Use: Never used  Substance Use Topics   Alcohol use: Yes    Comment: 4-5 beer daily   Drug use: No      Review of Systems  A complete ROS was performed with pertinent positives/negatives noted in the HPI.   Vitals:   07/21/22 1813 07/21/22 2026  BP: 122/67 122/69  Pulse: 81 85  Resp: 18 18  Temp: (!) 97.5 F (36.4 C) 97.7 F (36.5 C)  SpO2: 100% 94%    Physical Exam Vitals and nursing note reviewed. Exam conducted with a chaperone present.  Constitutional:       General: He is not in acute distress.    Appearance: He is well-developed.  HENT:     Head: Normocephalic and atraumatic.  Eyes:     Conjunctiva/sclera: Conjunctivae normal.  Cardiovascular:     Rate and Rhythm: Normal rate and regular rhythm.     Pulses: Normal pulses.     Heart sounds: Normal heart sounds. No murmur heard.    No friction rub. No gallop.  Pulmonary:     Effort: Pulmonary effort is normal. No respiratory distress.     Breath sounds: Normal breath sounds. No stridor. No wheezing, rhonchi or rales.  Abdominal:     General: Abdomen is flat. There is no distension.     Palpations: Abdomen is soft.     Tenderness: There is no abdominal tenderness. There is no guarding or rebound.  Genitourinary:    Comments: Rectal exam performed.  Normal rectal tone.  Hematochezia present Musculoskeletal:        General: No swelling.     Cervical back: Neck supple.  Skin:    General: Skin is warm and dry.     Capillary Refill: Capillary refill takes less than 2 seconds.  Neurological:  Mental Status: He is alert.  Psychiatric:        Mood and Affect: Mood normal.     Procedures  MDM:  Radiology results: CT ANGIO GI BLEED  Result Date: 07/21/2022 CLINICAL DATA:  Dark bloody stools for several hours, initial encounter EXAM: CTA ABDOMEN AND PELVIS WITHOUT AND WITH CONTRAST TECHNIQUE: Multidetector CT imaging of the abdomen and pelvis was performed using the standard protocol during bolus administration of intravenous contrast. Multiplanar reconstructed images and MIPs were obtained and reviewed to evaluate the vascular anatomy. RADIATION DOSE REDUCTION: This exam was performed according to the departmental dose-optimization program which includes automated exposure control, adjustment of the mA and/or kV according to patient size and/or use of iterative reconstruction technique. CONTRAST:  OMNIPAQUE IOHEXOL 350 MG/ML SOLN COMPARISON:  None Available. FINDINGS: VASCULAR  Aorta: Atherosclerotic calcifications are noted without aneurysmal dilatation or dissection. Celiac: Patent without evidence of aneurysm, dissection, vasculitis or significant stenosis. SMA: Patent without evidence of aneurysm, dissection, vasculitis or significant stenosis. Renals: Both renal arteries are patent without evidence of aneurysm, dissection, vasculitis, fibromuscular dysplasia or significant stenosis. IMA: Patent without evidence of aneurysm, dissection, vasculitis or significant stenosis. Inflow: Iliacs demonstrate atherosclerotic calcifications without aneurysmal dilatation. Veins: No significant venous abnormality is noted. Review of the MIP images confirms the above findings. NON-VASCULAR Lower chest: No acute abnormality. Hepatobiliary: No focal liver abnormality is seen. No gallstones, gallbladder wall thickening, or biliary dilatation. Pancreas: Unremarkable. No pancreatic ductal dilatation or surrounding inflammatory changes. Spleen: Normal in size without focal abnormality. Adrenals/Urinary Tract: Left adrenal gland is within normal limits. Right adrenal gland is significantly enlarged secondary to a 6.9 cm fatty lesion consistent with myelolipoma. Kidneys demonstrate cystic change bilaterally. No specific follow-up is recommended. Delayed images demonstrate normal enhancement. No obstructive changes are seen. The bladder is partially distended. Small lateral diverticulum is noted on the right. Stomach/Bowel: Diverticulosis without evidence of diverticulitis. No areas of active extravasation are identified to suggest acute GI hemorrhage. Stomach and small bowel are within normal limits. Lymphatic: No specific lymphadenopathy is noted. Reproductive: Prostate is unremarkable. Other: No abdominal wall hernia or abnormality. No abdominopelvic ascites. Musculoskeletal: Compression deformity at L1 is noted with increased kyphosis. This appears chronic in nature. No acute bony abnormality is noted.  IMPRESSION: VASCULAR No acute vascular abnormality is noted. No findings to suggest active GI hemorrhage are seen. NON-VASCULAR 6.9 cm fatty lesion in the right adrenal gland consistent with myelolipoma. Surgical consultation is recommended. Biochemical evaluation may be helpful as well. Diverticulosis without diverticulitis. Again, no active GI hemorrhage is noted. Electronically Signed   By: Alcide Clever M.D.   On: 07/21/2022 22:57     Lab results:  Results for orders placed or performed during the hospital encounter of 07/21/22 (from the past 24 hour(s))  Comprehensive metabolic panel     Status: Abnormal   Collection Time: 07/21/22  6:59 PM  Result Value Ref Range   Sodium 134 (L) 135 - 145 mmol/L   Potassium 3.9 3.5 - 5.1 mmol/L   Chloride 102 98 - 111 mmol/L   CO2 25 22 - 32 mmol/L   Glucose, Bld 75 70 - 99 mg/dL   BUN 9 8 - 23 mg/dL   Creatinine, Ser 0.98 0.61 - 1.24 mg/dL   Calcium 8.8 (L) 8.9 - 10.3 mg/dL   Total Protein 6.9 6.5 - 8.1 g/dL   Albumin 3.2 (L) 3.5 - 5.0 g/dL   AST 20 15 - 41 U/L   ALT 17 0 -  44 U/L   Alkaline Phosphatase 42 38 - 126 U/L   Total Bilirubin 0.7 0.3 - 1.2 mg/dL   GFR, Estimated >16 >10 mL/min   Anion gap 7 5 - 15  CBC     Status: None   Collection Time: 07/21/22  6:59 PM  Result Value Ref Range   WBC 8.5 4.0 - 10.5 K/uL   RBC 4.43 4.22 - 5.81 MIL/uL   Hemoglobin 14.7 13.0 - 17.0 g/dL   HCT 96.0 45.4 - 09.8 %   MCV 99.1 80.0 - 100.0 fL   MCH 33.2 26.0 - 34.0 pg   MCHC 33.5 30.0 - 36.0 g/dL   RDW 11.9 14.7 - 82.9 %   Platelets 195 150 - 400 K/uL   nRBC 0.0 0.0 - 0.2 %  Type and screen [facility]     Status: None   Collection Time: 07/21/22  6:59 PM  Result Value Ref Range   ABO/RH(D) O NEG    Antibody Screen NEG    Sample Expiration      07/24/2022,2359 Performed at [facility], [address], [place], [place] [address]   CBC     Status: None   Collection Time: 07/21/22  7:49 PM  Result Value Ref Range   WBC  8.4 4.0 - 10.5 K/uL   RBC 4.33 4.22 - 5.81 MIL/uL   Hemoglobin 14.4 13.0 - 17.0 g/dL   HCT 08.6 57.8 - 46.9 %   MCV 97.9 80.0 - 100.0 fL   MCH 33.3 26.0 - 34.0 pg   MCHC 34.0 30.0 - 36.0 g/dL   RDW 62.9 52.8 - 41.3 %   Platelets 179 150 - 400 K/uL   nRBC 0.0 0.0 - 0.2 %     Key medications administered in the ER:  Medications  iohexol (OMNIPAQUE) 350 MG/ML injection 100 mL (100 mLs Intravenous Contrast Given 07/21/22 2242)    Consults: Gastroenterology    Medical decision making: -Vital signs stable. Patient afebrile, hemodynamically stable, and non-toxic appearing. -Patient's presentation is most consistent with acute presentation with potential threat to life or bodily function.. Raymond Hurst is a 72 y.o. male presenting to the emergency department with a lower GI bleed.  -Additional history obtained from patient family member at bedside. -Per chart review, patient has not had an admission for GI bleed documented before.  -Patient had bowel movement in the emergency department and reports large amount of blood. -Patient is presenting with concerns of bloody stools.  My exam shows hematochezia concerning for lower GI bleed.  No nausea, vomiting, abdominal pain to suggest upper GI bleed.  Patient does not have peritonitis on exam.  She is hemodynamically stable without tachycardia or hypotension.  Type and screen sent.  CBC shows initial hemoglobin of 14.7 with repeat 14.4.  Patient has had multiple bloody bowel movements in the emergency department.  I have consulted gastroenterology who agree with admission and recommends CTA abdomen and pelvis to evaluate for active bleed.  This has been obtained and does not show evidence of active bleed.  Will proceed with medicine admission.  I discussed the patient with medicine who will admit.   Medical Decision Making Amount and/or Complexity of Data Reviewed Labs: ordered. Decision-making details documented in ED Course. Radiology:  ordered. Decision-making details documented in ED Course.  Risk Prescription drug management. Decision regarding hospitalization.     The plan for this patient was discussed with Dr. Elpidio Anis, who voiced agreement and who oversaw  evaluation and treatment of this patient.  Marta Lamas, MD Emergency Medicine, PGY-3  Note: Dragon medical dictation software was used in the creation of this note.   Clinical Impression:  1. Lower GI bleed     Rx / DC Orders ED Discharge Orders     None          Chase Caller, MD 07/21/22 2333    Mardene Sayer, MD 07/22/22 [phone]

## 2022-07-22 DIAGNOSIS — K5791 Diverticulosis of intestine, part unspecified, without perforation or abscess with bleeding: Secondary | ICD-10-CM | POA: Insufficient documentation

## 2022-07-22 DIAGNOSIS — Z7901 Long term (current) use of anticoagulants: Secondary | ICD-10-CM

## 2022-07-22 DIAGNOSIS — M109 Gout, unspecified: Secondary | ICD-10-CM

## 2022-07-22 DIAGNOSIS — D62 Acute posthemorrhagic anemia: Secondary | ICD-10-CM

## 2022-07-22 DIAGNOSIS — E278 Other specified disorders of adrenal gland: Secondary | ICD-10-CM

## 2022-07-22 DIAGNOSIS — K5731 Diverticulosis of large intestine without perforation or abscess with bleeding: Principal | ICD-10-CM

## 2022-07-22 DIAGNOSIS — Z6841 Body Mass Index (BMI) 40.0 and over, adult: Secondary | ICD-10-CM

## 2022-07-22 LAB — BASIC METABOLIC PANEL
Anion gap: 9 (ref 5–15)
BUN: 12 mg/dL (ref 8–23)
CO2: 25 mmol/L (ref 22–32)
Calcium: 8.8 mg/dL — ABNORMAL LOW (ref 8.9–10.3)
Chloride: 102 mmol/L (ref 98–111)
Creatinine, Ser: 1 mg/dL (ref 0.61–1.24)
GFR, Estimated: >60 mL/min (ref >60)
Glucose, Bld: 107 mg/dL — ABNORMAL HIGH (ref 70–99)
Potassium: 4.5 mmol/L (ref 3.5–5.1)
Sodium: 136 mmol/L (ref 135–145)

## 2022-07-22 LAB — CBC
HCT: 37.4 % — ABNORMAL LOW (ref 39.0–52.0)
Hemoglobin: 12.4 g/dL — ABNORMAL LOW (ref 13.0–17.0)
MCH: 32 pg (ref 26.0–34.0)
MCHC: 33.2 g/dL (ref 30.0–36.0)
MCV: 96.6 fL (ref 80.0–100.0)
Platelets: 167 10*3/uL (ref 150–400)
RBC: 3.87 MIL/uL — ABNORMAL LOW (ref 4.22–5.81)
RDW: 13.2 % (ref 11.5–15.5)
WBC: 6.9 10*3/uL (ref 4.0–10.5)
nRBC: 0 % (ref 0.0–0.2)

## 2022-07-22 LAB — HEMOGLOBIN AND HEMATOCRIT, BLOOD
HCT: 32.6 % — ABNORMAL LOW (ref 39.0–52.0)
HCT: 35 % — ABNORMAL LOW (ref 39.0–52.0)
HCT: 34.3 % — ABNORMAL LOW (ref 39.0–52.0)
Hemoglobin: 10.9 g/dL — ABNORMAL LOW (ref 13.0–17.0)
Hemoglobin: 11.8 g/dL — ABNORMAL LOW (ref 13.0–17.0)
Hemoglobin: 11.5 g/dL — ABNORMAL LOW (ref 13.0–17.0)

## 2022-07-22 LAB — ABO/RH: ABO/RH(D): O NEG

## 2022-07-22 LAB — HIV ANTIBODY (ROUTINE TESTING W REFLEX): HIV Screen 4th Generation wRfx: NONREACTIVE

## 2022-07-22 MED ORDER — PANTOPRAZOLE SODIUM 40 MG IV SOLR
40.0000 mg | Freq: Two times a day (BID) | INTRAVENOUS | Status: DC
  Administered 2022-07-22 – 2022-07-23 (×3): 40 mg via INTRAVENOUS
  Filled 2022-07-22 (×3): qty 10

## 2022-07-22 NOTE — Progress Notes (Signed)
PROGRESS NOTE  Raymond Hurst NUU:[id number] DOB: [birth year]   PCP: [facility]  Patient is from: Home.  Lives with his wife.  Independently ambulates at baseline.  DOA: 07/21/2022 LOS: 1  Chief complaints Chief Complaint  Patient presents with   Rectal Bleeding   On Eliquis     Brief Narrative / Interim history: 72 year old M with PMH of A-fib on Eliquis, lymphedema, OSA on CPAP, TIA, GIB and morbid obesity presenting with multiple bloody bowel movements and lightheadedness, and admitted for acute lower GI bleed likely diverticular.  Initially, Hgb 14.7.  Hemodynamically stable.  CT angio without extravasation but diverticulosis and incidental finding of 6.9 cm flat lesion in the right adrenal gland consistent with myelolipoma for which surgical consultation was recommended.  GI consulted and patient was admitted.  The next day, patient continued to have bloody bowel movements.  Hemoglobin dropped to 10.9  Subjective: Seen and examined earlier this morning.  Reports about 10 bloody bowel movements since 4 PM yesterday.  Last bowel movement about an hour ago.  He denies chest pain, dyspnea, dizziness or palpitation.  Denies abdominal pain.  Objective: Vitals:   07/22/22 0034 07/22/22 0057 07/22/22 0435 07/22/22 0801  BP:  125/83 (!) 84/58 115/79  Pulse: 84 (!) 106 73 82  Resp:  18 16   Temp:  97.9 F (36.6 C) 97.8 F (36.6 C)   TempSrc:      SpO2: 99% 99% 97% 98%  Weight:      Height:        Examination:  GENERAL: No apparent distress.  Nontoxic. HEENT: MMM.  Vision and hearing grossly intact.  NECK: Supple.  No apparent JVD.  RESP:  No IWOB.  Fair aeration bilaterally. CVS: Irregular rhythm.  Normal rate.  Heart sounds normal.  ABD/GI/GU: BS+. Abd soft, NTND.  MSK/EXT:  Moves extremities.  Lymphedema bilaterally, right> left. SKIN: no apparent skin lesion or wound NEURO: Awake, alert and oriented appropriately.  No apparent focal neuro  deficit. PSYCH: Calm. Normal affect.   Procedures:  None  Microbiology summarized: None  Assessment and plan: Principal Problem:   Lower GI bleeding Active Problems:   Paroxysmal atrial fibrillation (HCC)   OSA on CPAP   Gout   Morbid obesity due to excess calories (HCC)   Long term (current) use of anticoagulants   Right adrenal mass (HCC)   Diverticulosis of intestine with bleeding   Acute blood loss anemia  Acute blood loss anemia due to lower GI bleed in anticoagulated patient: Painless GI bleed.  CT angio without extravasation but diverticulosis.  Patient also on Eliquis.  Last dose the morning of 6/20. Recent Labs    07/21/22 1859 07/21/22 1949 07/22/22 0010 07/22/22 1057  HGB 14.7 14.4 12.4* 10.9*  -Monitor H&H.  Transfuse for Hgb less than 7.0. -Clear liquid diet.  Stop IV fluid. -[facility] to see patient. -Continue holding Eliquis   Paroxysmal atrial fibrillation (HCC): Rate controlled -Continue Lopressor with holding parameters -Continue holding Eliquis  Right adrenal mass: CT angio showed 6.9 cm right adrenal mass concerning for myelolipoma.  Surgical consultation recommended.  No clinical signs to suggest pheochromocytoma or hyperaldosteronism -Outpatient follow-up with general surgery.  OSA on CPAP -Continue CPAP at night  Chronic lymphedema: Right> left. -Continue compression socks   Gout -Continue allopurinol  GERD/history of GU/H. pylori -Continue PPI  Morbid obesity Body mass index is 43.4 kg/m. -Encourage lifestyle change to lose weight  DVT prophylaxis:  SCDs Start: 07/21/22 2353  Code Status: Full code Family Communication: None at bedside Level of care: Telemetry Medical Status is: Inpatient Remains inpatient appropriate because: Acute blood loss anemia due to lower GI bleed/hematochezia in anticoagulated patient   Final disposition: Home Consultants:  Gastroenterology  55 minutes with more than 50% spent in  reviewing records, counseling patient/family and coordinating care.   Sch Meds:  Scheduled Meds:  cholecalciferol  4,000 Units Oral Daily   metoprolol tartrate  25 mg Oral BID   pantoprazole (PROTONIX) IV  40 mg Intravenous Q12H   vitamin B-12  250 mcg Oral Daily   Continuous Infusions:  sodium chloride 100 mL/hr at 07/22/22 0634   PRN Meds:.acetaminophen **OR** acetaminophen, magnesium hydroxide, ondansetron **OR** ondansetron (ZOFRAN) IV, traZODone  Antimicrobials: Anti-infectives (From admission, onward)    None        I have personally reviewed the following labs and images: CBC: Recent Labs  Lab 07/21/22 1859 07/21/22 1949 07/22/22 0010 07/22/22 1057  WBC 8.5 8.4 6.9  --   HGB 14.7 14.4 12.4* 10.9*  HCT 43.9 42.4 37.4* 32.6*  MCV 99.1 97.9 96.6  --   PLT 195 179 167  --    BMP &GFR Recent Labs  Lab 07/21/22 1859 07/22/22 0010  NA 134* 136  K 3.9 4.5  CL 102 102  CO2 25 25  GLUCOSE 75 107*  BUN 9 12  CREATININE 1.04 1.00  CALCIUM 8.8* 8.8*   Estimated Creatinine Clearance: 101.7 mL/min (by C-G formula based on SCr of 1 mg/dL). Liver & Pancreas: Recent Labs  Lab 07/21/22 1859  AST 20  ALT 17  ALKPHOS 42  BILITOT 0.7  PROT 6.9  ALBUMIN 3.2*   No results for input(s): "LIPASE", "AMYLASE" in the last 168 hours. No results for input(s): "AMMONIA" in the last 168 hours. Diabetic: No results for input(s): "HGBA1C" in the last 72 hours. No results for input(s): "GLUCAP" in the last 168 hours. Cardiac Enzymes: No results for input(s): "CKTOTAL", "CKMB", "CKMBINDEX", "TROPONINI" in the last 168 hours. No results for input(s): "PROBNP" in the last 8760 hours. Coagulation Profile: No results for input(s): "INR", "PROTIME" in the last 168 hours. Thyroid Function Tests: No results for input(s): "TSH", "T4TOTAL", "FREET4", "T3FREE", "THYROIDAB" in the last 72 hours. Lipid Profile: No results for input(s): "CHOL", "HDL", "LDLCALC", "TRIG", "CHOLHDL",  "LDLDIRECT" in the last 72 hours. Anemia Panel: No results for input(s): "VITAMINB12", "FOLATE", "FERRITIN", "TIBC", "IRON", "RETICCTPCT" in the last 72 hours. Urine analysis: No results found for: "COLORURINE", "APPEARANCEUR", "LABSPEC", "PHURINE", "GLUCOSEU", "HGBUR", "BILIRUBINUR", "KETONESUR", "PROTEINUR", "UROBILINOGEN", "NITRITE", "LEUKOCYTESUR" Sepsis Labs: Invalid input(s): "PROCALCITONIN", "LACTICIDVEN"  Microbiology: No results found for this or any previous visit (from the past 240 hour(s)).  Radiology Studies: CT ANGIO GI BLEED  Result Date: 07/21/2022 CLINICAL DATA:  Dark bloody stools for several hours, initial encounter EXAM: CTA ABDOMEN AND PELVIS WITHOUT AND WITH CONTRAST TECHNIQUE: Multidetector CT imaging of the abdomen and pelvis was performed using the standard protocol during bolus administration of intravenous contrast. Multiplanar reconstructed images and MIPs were obtained and reviewed to evaluate the vascular anatomy. RADIATION DOSE REDUCTION: This exam was performed according to the departmental dose-optimization program which includes automated exposure control, adjustment of the mA and/or kV according to patient size and/or use of iterative reconstruction technique. CONTRAST:  OMNIPAQUE IOHEXOL 350 MG/ML SOLN COMPARISON:  None Available. FINDINGS: VASCULAR Aorta: Atherosclerotic calcifications are noted without aneurysmal dilatation or dissection. Celiac: Patent without evidence of aneurysm,  dissection, vasculitis or significant stenosis. SMA: Patent without evidence of aneurysm, dissection, vasculitis or significant stenosis. Renals: Both renal arteries are patent without evidence of aneurysm, dissection, vasculitis, fibromuscular dysplasia or significant stenosis. IMA: Patent without evidence of aneurysm, dissection, vasculitis or significant stenosis. Inflow: Iliacs demonstrate atherosclerotic calcifications without aneurysmal dilatation. Veins: No significant  venous abnormality is noted. Review of the MIP images confirms the above findings. NON-VASCULAR Lower chest: No acute abnormality. Hepatobiliary: No focal liver abnormality is seen. No gallstones, gallbladder wall thickening, or biliary dilatation. Pancreas: Unremarkable. No pancreatic ductal dilatation or surrounding inflammatory changes. Spleen: Normal in size without focal abnormality. Adrenals/Urinary Tract: Left adrenal gland is within normal limits. Right adrenal gland is significantly enlarged secondary to a 6.9 cm fatty lesion consistent with myelolipoma. Kidneys demonstrate cystic change bilaterally. No specific follow-up is recommended. Delayed images demonstrate normal enhancement. No obstructive changes are seen. The bladder is partially distended. Small lateral diverticulum is noted on the right. Stomach/Bowel: Diverticulosis without evidence of diverticulitis. No areas of active extravasation are identified to suggest acute GI hemorrhage. Stomach and small bowel are within normal limits. Lymphatic: No specific lymphadenopathy is noted. Reproductive: Prostate is unremarkable. Other: No abdominal wall hernia or abnormality. No abdominopelvic ascites. Musculoskeletal: Compression deformity at L1 is noted with increased kyphosis. This appears chronic in nature. No acute bony abnormality is noted. IMPRESSION: VASCULAR No acute vascular abnormality is noted. No findings to suggest active GI hemorrhage are seen. NON-VASCULAR 6.9 cm fatty lesion in the right adrenal gland consistent with myelolipoma. Surgical consultation is recommended. Biochemical evaluation may be helpful as well. Diverticulosis without diverticulitis. Again, no active GI hemorrhage is noted. Electronically Signed   By: Alcide Clever M.D.   On: 07/21/2022 22:57       T.  [name]  If 7PM-7AM, please contact night-coverage [email or web address] 07/22/2022, 11:58 AM

## 2022-07-22 NOTE — Plan of Care (Signed)

## 2022-07-22 NOTE — Assessment & Plan Note (Signed)
-   His Eliquis will be held off. - We will continue Lopressor.

## 2022-07-22 NOTE — Consult Note (Signed)
[facility] Consultation Note  Referring Provider: [facility] Primary Care Physician:  Verl Blalock Primary Gastroenterologist:  [facility] (previously Dr. Randa Evens)  Reason for Consultation:  hematochezia  HPI: STEPFON Hurst is a 72 y.o. male admitted hematochezia.  Colonoscopy 2018 multiple colonic diverticula.  No hematemesis or abdominal pain.  Eliquis for atrial fibrillation.  CT angiogram diverticulosis but no site active bleeding.   Past Medical History:  Diagnosis Date   Atrial fibrillation (HCC)    Cellulitis of right lower leg    History of TIA (transient ischemic attack)    May 2020   Lymphedema    OSA on CPAP    severe obstructive sleep apnea with an AHI of 54/h with no significant central events.  He had nocturnal hypoxemia with O2 sat nadir of 70%. Now on CPAP    Past Surgical History:  Procedure Laterality Date   INNER EAR SURGERY     ORTHOPEDIC SURGERY      Prior to Admission medications   Medication Sig Start Date End Date Taking? Authorizing Provider  acetaminophen (TYLENOL) 500 MG tablet Take 500 mg by mouth every 6 (six) hours as needed for mild pain.   Yes [provider]  allopurinol (ZYLOPRIM) 100 MG tablet Take 100 mg by mouth daily. 05/25/22  Yes [provider]  apixaban (ELIQUIS) 5 MG TABS tablet Take 1 tablet (5 mg total) by mouth 2 (two) times daily. 04/05/22  Yes Jonelle Sidle, MD  Cholecalciferol (VITAMIN D) 125 MCG (5000 UT) CAPS Take 5,000 Units by mouth daily.   Yes [provider]  Cyanocobalamin (VITAMIN B 12 PO) Take 1 tablet by mouth daily.   Yes [provider]  furosemide (LASIX) 40 MG tablet Take 1 tablet (40 mg total) by mouth daily. Patient taking differently: Take 20 mg by mouth daily. 11/17/21  Yes Strader, [place] M, PA-C  metoprolol tartrate (LOPRESSOR) 25 MG tablet Take 1 tablet (25 mg total) by mouth 2 (two) times daily. 11/17/21  Yes Strader, Lennart Pall, PA-C     Current Facility-Administered Medications  Medication Dose Route Frequency Provider Last Rate Last Admin   acetaminophen (TYLENOL) tablet 650 mg  650 mg Oral Q6H PRN Mansy, Jan A, MD       Or   acetaminophen (TYLENOL) suppository 650 mg  650 mg Rectal Q6H PRN Mansy, Jan A, MD       cholecalciferol (VITAMIN D3) 25 MCG (1000 UNIT) tablet 4,000 Units  4,000 Units Oral Daily Mansy, Jan A, MD   4,000 Units at 07/22/22 0851   magnesium hydroxide (MILK OF MAGNESIA) suspension 30 mL  30 mL Oral Daily PRN Mansy, Jan A, MD       metoprolol tartrate (LOPRESSOR) tablet 25 mg  25 mg Oral BID Candelaria Stagers T, MD   25 mg at 07/22/22 0851   ondansetron (ZOFRAN) tablet 4 mg  4 mg Oral Q6H PRN Mansy, Jan A, MD       Or   ondansetron [facility]) injection 4 mg  4 mg Intravenous Q6H PRN Mansy, Jan A, MD       pantoprazole (PROTONIX) injection 40 mg  40 mg Intravenous Q12H Mansy, Jan A, MD   40 mg at 07/22/22 8416   traZODone (DESYREL) tablet 25 mg  25 mg Oral QHS PRN Mansy, Jan A, MD       vitamin B-12 (CYANOCOBALAMIN) tablet 250 mcg  250 mcg Oral Daily Mansy, Jan A, MD   250 mcg at  07/22/22 0851    Allergies as of 07/21/2022   (No Known Allergies)    Family History  Problem Relation Age of Onset   Heart failure Mother    COPD Mother    Atrial fibrillation Mother    Idiopathic pulmonary fibrosis Father     Social History   Socioeconomic History   Marital status: Married    Spouse name: Not on file   Number of children: Not on file   Years of education: Not on file   Highest education level: Not on file  Occupational History   Not on file  Tobacco Use   Smoking status: Former    Packs/day: 2.00    Years: 20.00    Additional pack years: 0.00    Total pack years: 40.00    Types: Cigarettes    Quit date: 01/31/1990    Years since quitting: 32.4   Smokeless tobacco: Never  Vaping Use   Vaping Use: Never used  Substance and Sexual Activity   Alcohol use: Yes    Comment: 4-5 beer daily    Drug use: No   Sexual activity: Not on file  Other Topics Concern   Not on file  Social History Narrative   Right handed.    Social Determinants of Health   Financial Resource Strain: Not on file  Food Insecurity: No Food Insecurity (07/22/2022)   Hunger Vital Sign    Worried About Running Out of Food in the Last Year: Never true    Ran Out of Food in the Last Year: Never true  Transportation Needs: No Transportation Needs (07/22/2022)   PRAPARE - Administrator, Civil Service (Medical): No    Lack of Transportation (Non-Medical): No  Physical Activity: Not on file  Stress: Not on file  Social Connections: Not on file  Intimate Partner Violence: Not At Risk (07/22/2022)   Humiliation, Afraid, Rape, and Kick questionnaire    Fear of Current or Ex-Partner: No    Emotionally Abused: No    Physically Abused: No    Sexually Abused: No    Review of Systems: As per HPI, all others negative  Physical Exam: Vital signs in last 24 hours: Temp:  [97.5 F (36.4 C)-98.7 F (37.1 C)] 98 F (36.7 C) (06/21 1553) Pulse Rate:  [65-106] 65 (06/21 1553) Resp:  [16-18] 18 (06/21 1553) BP: (84-125)/(58-83) 124/81 (06/21 1553) SpO2:  [94 %-100 %] 98 % (06/21 1553) Weight:  [145.2 kg] 145.2 kg (06/20 1842) Last BM Date : 07/22/22 General:   Alert, overweight, deconditioned-appearing, pleasant and cooperative in NAD Head:  Normocephalic and atraumatic. Eyes:  Sclera clear, no icterus.   Conjunctiva pink. Ears:  Normal auditory acuity. Nose:  No deformity, discharge,  or lesions. Mouth:  No deformity or lesions.  Oropharynx pink & moist. Neck:  Supple; no masses or thyromegaly. Lungs:  No respiratory distress Abdomen:  Soft, protuberant, nontender and nondistended. No masses, hepatosplenomegaly or hernias noted. Normal bowel sounds, without guarding, and without rebound.     Msk:  Symmetrical without gross deformities. Normal posture. Pulses:  Normal pulses noted. Extremities:   Without clubbing or edema. Neurologic:  Alert and  oriented x4;  grossly normal neurologically. Skin:  Intact without significant lesions or rashes. Psych:  Alert and cooperative. Normal mood and affect.   Lab Results: Recent Labs    07/21/22 1859 07/21/22 1949 07/22/22 0010 07/22/22 1057  WBC 8.5 8.4 6.9  --   HGB 14.7 14.4 12.4* 10.9*  HCT 43.9 42.4 37.4* 32.6*  PLT 195 179 167  --    BMET Recent Labs    07/21/22 1859 07/22/22 0010  NA 134* 136  K 3.9 4.5  CL 102 102  CO2 25 25  GLUCOSE 75 107*  BUN 9 12  CREATININE 1.04 1.00  CALCIUM 8.8* 8.8*   LFT Recent Labs    07/21/22 1859  PROT 6.9  ALBUMIN 3.2*  AST 20  ALT 17  ALKPHOS 42  BILITOT 0.7   PT/INR No results for input(s): "LABPROT", "INR" in the last 72 hours.  Studies/Results: CT ANGIO GI BLEED  Result Date: 07/21/2022 CLINICAL DATA:  Dark bloody stools for several hours, initial encounter EXAM: CTA ABDOMEN AND PELVIS WITHOUT AND WITH CONTRAST TECHNIQUE: Multidetector CT imaging of the abdomen and pelvis was performed using the standard protocol during bolus administration of intravenous contrast. Multiplanar reconstructed images and MIPs were obtained and reviewed to evaluate the vascular anatomy. RADIATION DOSE REDUCTION: This exam was performed according to the departmental dose-optimization program which includes automated exposure control, adjustment of the mA and/or kV according to patient size and/or use of iterative reconstruction technique. CONTRAST:  OMNIPAQUE IOHEXOL 350 MG/ML SOLN COMPARISON:  None Available. FINDINGS: VASCULAR Aorta: Atherosclerotic calcifications are noted without aneurysmal dilatation or dissection. Celiac: Patent without evidence of aneurysm, dissection, vasculitis or significant stenosis. SMA: Patent without evidence of aneurysm, dissection, vasculitis or significant stenosis. Renals: Both renal arteries are patent without evidence of aneurysm, dissection, vasculitis,  fibromuscular dysplasia or significant stenosis. IMA: Patent without evidence of aneurysm, dissection, vasculitis or significant stenosis. Inflow: Iliacs demonstrate atherosclerotic calcifications without aneurysmal dilatation. Veins: No significant venous abnormality is noted. Review of the MIP images confirms the above findings. NON-VASCULAR Lower chest: No acute abnormality. Hepatobiliary: No focal liver abnormality is seen. No gallstones, gallbladder wall thickening, or biliary dilatation. Pancreas: Unremarkable. No pancreatic ductal dilatation or surrounding inflammatory changes. Spleen: Normal in size without focal abnormality. Adrenals/Urinary Tract: Left adrenal gland is within normal limits. Right adrenal gland is significantly enlarged secondary to a 6.9 cm fatty lesion consistent with myelolipoma. Kidneys demonstrate cystic change bilaterally. No specific follow-up is recommended. Delayed images demonstrate normal enhancement. No obstructive changes are seen. The bladder is partially distended. Small lateral diverticulum is noted on the right. Stomach/Bowel: Diverticulosis without evidence of diverticulitis. No areas of active extravasation are identified to suggest acute GI hemorrhage. Stomach and small bowel are within normal limits. Lymphatic: No specific lymphadenopathy is noted. Reproductive: Prostate is unremarkable. Other: No abdominal wall hernia or abnormality. No abdominopelvic ascites. Musculoskeletal: Compression deformity at L1 is noted with increased kyphosis. This appears chronic in nature. No acute bony abnormality is noted. IMPRESSION: VASCULAR No acute vascular abnormality is noted. No findings to suggest active GI hemorrhage are seen. NON-VASCULAR 6.9 cm fatty lesion in the right adrenal gland consistent with myelolipoma. Surgical consultation is recommended. Biochemical evaluation may be helpful as well. Diverticulosis without diverticulitis. Again, no active GI hemorrhage is noted.  Electronically Signed   By: Alcide Clever M.D.   On: 07/21/2022 22:57    Impression:   Hematochezia.  Suspect colonic diverticulosis. Acute blood loss anemia. Chronic anticoagulation, apixaban, history of atrial fibrillation. Kidney lesion, ? Myelolipoma.  Plan:   Anticoagulation on hold; ideally hold for one week. Follow CBCs. Full liquid diet, slowly advance as tolerated. When/if worsening bleeding, would consider repeat CT angiogram. Outpatient surgical evaluation of kidney/adrenal gland lesion. [facility] will follow.   LOS: 1 day   ,  M  07/22/2022, 4:15 PM  Cell [phone] If no answer or after 5 PM call [phone]

## 2022-07-22 NOTE — ED Notes (Signed)
ED TO INPATIENT HANDOFF REPORT  ED Nurse Name and Phone #: [phone] Lilian Coma Name/Age/Gender Raymond Hurst 72 y.o. male Room/Bed: 016C/016C  Code Status   Code Status: Full Code  Home/SNF/Other Home Patient oriented to: self, place, time, and situation Is this baseline? Yes   Triage Complete: Triage complete  Chief Complaint Lower GI bleeding [K92.2]  Triage Note Pt came in via POV d/t dark bloody stools since 1600 today. Does take Eliquis d/t Hx of A-Fib. Does report feeling light headed, denies abd pain, weakness, SOB or CP.   Allergies No Known Allergies  Level of Care/Admitting Diagnosis ED Disposition     ED Disposition  Admit   Condition  --   Comment  Hospital Area: [facility] [[id number]]  Level of Care: Telemetry Medical [104]  May admit patient to [facility] or [facility] if equivalent level of care is available:: No  Covid Evaluation: Asymptomatic - no recent exposure (last 10 days) testing not required  Diagnosis: Lower GI bleeding [811914]  Admitting Physician: Hannah Beat [[id number]]  Attending Physician: Hannah Beat [[id number]]  Certification:: I certify this patient will need inpatient services for at least 2 midnights  Estimated Length of Stay: 2          B Medical/Surgery History Past Medical History:  Diagnosis Date   Atrial fibrillation (HCC)    Cellulitis of right lower leg    History of TIA (transient ischemic attack)    May 2020   Lymphedema    OSA on CPAP    severe obstructive sleep apnea with an AHI of 54/h with no significant central events.  He had nocturnal hypoxemia with O2 sat nadir of 70%. Now on CPAP   Past Surgical History:  Procedure Laterality Date   INNER EAR SURGERY     ORTHOPEDIC SURGERY       A IV Location/Drains/Wounds Patient Lines/Drains/Airways Status     Active Line/Drains/Airways     Name Placement date Placement time Site Days   Peripheral IV 07/21/22 20 G 1"  Anterior;Distal;Left;Upper Arm 07/21/22  2024  Arm  1            Intake/Output Last 24 hours No intake or output data in the 24 hours ending 07/22/22 0001  Labs/Imaging Results for orders placed or performed during the hospital encounter of 07/21/22 (from the past 48 hour(s))  Comprehensive metabolic panel     Status: Abnormal   Collection Time: 07/21/22  6:59 PM  Result Value Ref Range   Sodium 134 (L) 135 - 145 mmol/L   Potassium 3.9 3.5 - 5.1 mmol/L   Chloride 102 98 - 111 mmol/L   CO2 25 22 - 32 mmol/L   Glucose, Bld 75 70 - 99 mg/dL    Comment: Glucose reference range applies only to samples taken after fasting for at least 8 hours.   BUN 9 8 - 23 mg/dL   Creatinine, Ser 8.46 0.61 - 1.24 mg/dL   Calcium 8.8 (L) 8.9 - 10.3 mg/dL   Total Protein 6.9 6.5 - 8.1 g/dL   Albumin 3.2 (L) 3.5 - 5.0 g/dL   AST 20 15 - 41 U/L   ALT 17 0 - 44 U/L   Alkaline Phosphatase 42 38 - 126 U/L   Total Bilirubin 0.7 0.3 - 1.2 mg/dL   GFR, Estimated >96 >29 mL/min    Comment: (NOTE) Calculated using the CKD-EPI Creatinine Equation (2021)    Anion gap 7 5 -  15    Comment: Performed at [facility], [address], [place], [place] [address]  CBC     Status: None   Collection Time: 07/21/22  6:59 PM  Result Value Ref Range   WBC 8.5 4.0 - 10.5 K/uL   RBC 4.43 4.22 - 5.81 MIL/uL   Hemoglobin 14.7 13.0 - 17.0 g/dL   HCT 60.4 54.0 - 98.1 %   MCV 99.1 80.0 - 100.0 fL   MCH 33.2 26.0 - 34.0 pg   MCHC 33.5 30.0 - 36.0 g/dL   RDW 19.1 47.8 - 29.5 %   Platelets 195 150 - 400 K/uL   nRBC 0.0 0.0 - 0.2 %    Comment: Performed at [facility], [address], [place], [place] [address]  Type and screen [facility]     Status: None   Collection Time: 07/21/22  6:59 PM  Result Value Ref Range   ABO/RH(D) O NEG    Antibody Screen NEG    Sample Expiration      07/24/2022,2359 Performed at [facility], [address], [place], [place] [address]   CBC      Status: None   Collection Time: 07/21/22  7:49 PM  Result Value Ref Range   WBC 8.4 4.0 - 10.5 K/uL   RBC 4.33 4.22 - 5.81 MIL/uL   Hemoglobin 14.4 13.0 - 17.0 g/dL   HCT 46.9 62.9 - 52.8 %   MCV 97.9 80.0 - 100.0 fL   MCH 33.3 26.0 - 34.0 pg   MCHC 34.0 30.0 - 36.0 g/dL   RDW 41.3 24.4 - 01.0 %   Platelets 179 150 - 400 K/uL   nRBC 0.0 0.0 - 0.2 %    Comment: Performed at [facility], [address], [place], [place] [address]   CT ANGIO GI BLEED  Result Date: 07/21/2022 CLINICAL DATA:  Dark bloody stools for several hours, initial encounter EXAM: CTA ABDOMEN AND PELVIS WITHOUT AND WITH CONTRAST TECHNIQUE: Multidetector CT imaging of the abdomen and pelvis was performed using the standard protocol during bolus administration of intravenous contrast. Multiplanar reconstructed images and MIPs were obtained and reviewed to evaluate the vascular anatomy. RADIATION DOSE REDUCTION: This exam was performed according to the departmental dose-optimization program which includes automated exposure control, adjustment of the mA and/or kV according to patient size and/or use of iterative reconstruction technique. CONTRAST:  OMNIPAQUE IOHEXOL 350 MG/ML SOLN COMPARISON:  None Available. FINDINGS: VASCULAR Aorta: Atherosclerotic calcifications are noted without aneurysmal dilatation or dissection. Celiac: Patent without evidence of aneurysm, dissection, vasculitis or significant stenosis. SMA: Patent without evidence of aneurysm, dissection, vasculitis or significant stenosis. Renals: Both renal arteries are patent without evidence of aneurysm, dissection, vasculitis, fibromuscular dysplasia or significant stenosis. IMA: Patent without evidence of aneurysm, dissection, vasculitis or significant stenosis. Inflow: Iliacs demonstrate atherosclerotic calcifications without aneurysmal dilatation. Veins: No significant venous abnormality is noted. Review of the MIP images confirms the above findings.  NON-VASCULAR Lower chest: No acute abnormality. Hepatobiliary: No focal liver abnormality is seen. No gallstones, gallbladder wall thickening, or biliary dilatation. Pancreas: Unremarkable. No pancreatic ductal dilatation or surrounding inflammatory changes. Spleen: Normal in size without focal abnormality. Adrenals/Urinary Tract: Left adrenal gland is within normal limits. Right adrenal gland is significantly enlarged secondary to a 6.9 cm fatty lesion consistent with myelolipoma. Kidneys demonstrate cystic change bilaterally. No specific follow-up is recommended. Delayed images demonstrate normal enhancement. No obstructive changes are seen. The  bladder is partially distended. Small lateral diverticulum is noted on the right. Stomach/Bowel: Diverticulosis without evidence of diverticulitis. No areas of active extravasation are identified to suggest acute GI hemorrhage. Stomach and small bowel are within normal limits. Lymphatic: No specific lymphadenopathy is noted. Reproductive: Prostate is unremarkable. Other: No abdominal wall hernia or abnormality. No abdominopelvic ascites. Musculoskeletal: Compression deformity at L1 is noted with increased kyphosis. This appears chronic in nature. No acute bony abnormality is noted. IMPRESSION: VASCULAR No acute vascular abnormality is noted. No findings to suggest active GI hemorrhage are seen. NON-VASCULAR 6.9 cm fatty lesion in the right adrenal gland consistent with myelolipoma. Surgical consultation is recommended. Biochemical evaluation may be helpful as well. Diverticulosis without diverticulitis. Again, no active GI hemorrhage is noted. Electronically Signed   By: Alcide Clever M.D.   On: 07/21/2022 22:57    Pending Labs Unresulted Labs (From admission, onward)     Start     Ordered   07/22/22 1100  Hemoglobin and hematocrit, blood  Now then every 6 hours,   R      07/21/22 2355   07/22/22 0500  Basic metabolic panel  Tomorrow morning,   R        07/21/22  2353   07/22/22 0500  CBC  Tomorrow morning,   R        07/21/22 2353   07/21/22 2353  HIV Antibody (routine testing w rflx)  (HIV Antibody (Routine testing w reflex) panel)  Once,   R        07/21/22 2353   07/21/22 2319  CBC  ONCE - STAT,   STAT        07/21/22 2318            Vitals/Pain Today's Vitals   07/21/22 1813 07/21/22 1842 07/21/22 2023 07/21/22 2026  BP: 122/67   122/69  Pulse: 81   85  Resp: 18   18  Temp: (!) 97.5 F (36.4 C)   97.7 F (36.5 C)  TempSrc: Oral   Oral  SpO2: 100%   94%  Weight:  (!) 145.2 kg    Height:  6' (1.829 m)    PainSc:  0-No pain 0-No pain 0-No pain    Isolation Precautions No active isolations  Medications Medications  metoprolol tartrate (LOPRESSOR) tablet 25 mg (has no administration in time range)  Vitamin D 4,000 Units (has no administration in time range)  Vitamin B 12 LOZG 250 mcg (has no administration in time range)  0.9 %  sodium chloride infusion (has no administration in time range)  acetaminophen (TYLENOL) tablet 650 mg (has no administration in time range)    Or  acetaminophen (TYLENOL) suppository 650 mg (has no administration in time range)  traZODone (DESYREL) tablet 25 mg (has no administration in time range)  magnesium hydroxide (MILK OF MAGNESIA) suspension 30 mL (has no administration in time range)  ondansetron (ZOFRAN) tablet 4 mg (has no administration in time range)    Or  ondansetron (ZOFRAN) injection 4 mg (has no administration in time range)  iohexol (OMNIPAQUE) 350 MG/ML injection 100 mL (100 mLs Intravenous Contrast Given 07/21/22 2242)    Mobility walks     Focused Assessments    R Recommendations: See Admitting Provider Note  Report given to:   Additional Notes: continent x2, provider aware of bloody stool

## 2022-07-22 NOTE — Assessment & Plan Note (Signed)
-   The patient will be admitted to a medical telemetry bed. - We will follow serial hemoglobins and hematocrits. - Given recent dark stools we will place him for now on IV Protonix. - GI consult to be obtained. - Dr.Steven Armbruster was notified about the patient and is aware.

## 2022-07-22 NOTE — ED Notes (Signed)
ED TO INPATIENT HANDOFF REPORT  ED Nurse Name and Phone #: [address], [place] [phone]  S Name/Age/Gender Erin Fulling 72 y.o. male Room/Bed: 016C/016C  Code Status   Code Status: Full Code  Home/SNF/Other Home Patient oriented to: self, place, time, and situation Is this baseline? Yes   Triage Complete: Triage complete  Chief Complaint Lower GI bleeding [K92.2]  Triage Note Pt came in via POV d/t dark bloody stools since 1600 today. Does take Eliquis d/t Hx of A-Fib. Does report feeling light headed, denies abd pain, weakness, SOB or CP.   Allergies No Known Allergies  Level of Care/Admitting Diagnosis ED Disposition     ED Disposition  Admit   Condition  --   Comment  Hospital Area: [facility] [[id number]]  Level of Care: Telemetry Medical [104]  May admit patient to [facility] if equivalent level of care is available:: No  Covid Evaluation: Asymptomatic - no recent exposure (last 10 days) testing not required  Diagnosis: Lower GI bleeding [454098]  Admitting Physician: Hannah Beat [[id number]]  Attending Physician: Hannah Beat [[id number]]  Certification:: I certify this patient will need inpatient services for at least 2 midnights  Estimated Length of Stay: 2          B Medical/Surgery History Past Medical History:  Diagnosis Date   Atrial fibrillation (HCC)    Cellulitis of right lower leg    History of TIA (transient ischemic attack)    May 2020   Lymphedema    OSA on CPAP    severe obstructive sleep apnea with an AHI of 54/h with no significant central events.  He had nocturnal hypoxemia with O2 sat nadir of 70%. Now on CPAP   Past Surgical History:  Procedure Laterality Date   INNER EAR SURGERY     ORTHOPEDIC SURGERY       A IV Location/Drains/Wounds Patient Lines/Drains/Airways Status     Active Line/Drains/Airways     Name Placement date Placement time Site Days   Peripheral IV 07/21/22 20 G 1"  Anterior;Distal;Left;Upper Arm 07/21/22  2024  Arm  1            Intake/Output Last 24 hours No intake or output data in the 24 hours ending 07/22/22 0036  Labs/Imaging Results for orders placed or performed during the hospital encounter of 07/21/22 (from the past 48 hour(s))  Comprehensive metabolic panel     Status: Abnormal   Collection Time: 07/21/22  6:59 PM  Result Value Ref Range   Sodium 134 (L) 135 - 145 mmol/L   Potassium 3.9 3.5 - 5.1 mmol/L   Chloride 102 98 - 111 mmol/L   CO2 25 22 - 32 mmol/L   Glucose, Bld 75 70 - 99 mg/dL    Comment: Glucose reference range applies only to samples taken after fasting for at least 8 hours.   BUN 9 8 - 23 mg/dL   Creatinine, Ser 0.86 0.61 - 1.24 mg/dL   Calcium 8.8 (L) 8.9 - 10.3 mg/dL   Total Protein 6.9 6.5 - 8.1 g/dL   Albumin 3.2 (L) 3.5 - 5.0 g/dL   AST 20 15 - 41 U/L   ALT 17 0 - 44 U/L   Alkaline Phosphatase 42 38 - 126 U/L   Total Bilirubin 0.7 0.3 - 1.2 mg/dL   GFR, Estimated >57 >84 mL/min    Comment: (NOTE) Calculated using the CKD-EPI Creatinine Equation (2021)    Anion gap 7  5 - 15    Comment: Performed at [facility], [address] [place], [place] [address]  CBC     Status: None   Collection Time: 07/21/22  6:59 PM  Result Value Ref Range   WBC 8.5 4.0 - 10.5 K/uL   RBC 4.43 4.22 - 5.81 MIL/uL   Hemoglobin 14.7 13.0 - 17.0 g/dL   HCT 76.2 83.1 - 51.7 %   MCV 99.1 80.0 - 100.0 fL   MCH 33.2 26.0 - 34.0 pg   MCHC 33.5 30.0 - 36.0 g/dL   RDW 61.6 07.3 - 71.0 %   Platelets 195 150 - 400 K/uL   nRBC 0.0 0.0 - 0.2 %    Comment: Performed at [facility], [address], [place], [place] [address]  Type and screen [facility]     Status: None   Collection Time: 07/21/22  6:59 PM  Result Value Ref Range   ABO/RH(D) O NEG    Antibody Screen NEG    Sample Expiration      07/24/2022,2359 Performed at [facility], [address] [place], [place] [address]   CBC      Status: None   Collection Time: 07/21/22  7:49 PM  Result Value Ref Range   WBC 8.4 4.0 - 10.5 K/uL   RBC 4.33 4.22 - 5.81 MIL/uL   Hemoglobin 14.4 13.0 - 17.0 g/dL   HCT 70.3 50.0 - 93.8 %   MCV 97.9 80.0 - 100.0 fL   MCH 33.3 26.0 - 34.0 pg   MCHC 34.0 30.0 - 36.0 g/dL   RDW 18.2 99.3 - 71.6 %   Platelets 179 150 - 400 K/uL   nRBC 0.0 0.0 - 0.2 %    Comment: Performed at [facility], [address], [place], [place] [address]   CT ANGIO GI BLEED  Result Date: 07/21/2022 CLINICAL DATA:  Dark bloody stools for several hours, initial encounter EXAM: CTA ABDOMEN AND PELVIS WITHOUT AND WITH CONTRAST TECHNIQUE: Multidetector CT imaging of the abdomen and pelvis was performed using the standard protocol during bolus administration of intravenous contrast. Multiplanar reconstructed images and MIPs were obtained and reviewed to evaluate the vascular anatomy. RADIATION DOSE REDUCTION: This exam was performed according to the departmental dose-optimization program which includes automated exposure control, adjustment of the mA and/or kV according to patient size and/or use of iterative reconstruction technique. CONTRAST:  OMNIPAQUE IOHEXOL 350 MG/ML SOLN COMPARISON:  None Available. FINDINGS: VASCULAR Aorta: Atherosclerotic calcifications are noted without aneurysmal dilatation or dissection. Celiac: Patent without evidence of aneurysm, dissection, vasculitis or significant stenosis. SMA: Patent without evidence of aneurysm, dissection, vasculitis or significant stenosis. Renals: Both renal arteries are patent without evidence of aneurysm, dissection, vasculitis, fibromuscular dysplasia or significant stenosis. IMA: Patent without evidence of aneurysm, dissection, vasculitis or significant stenosis. Inflow: Iliacs demonstrate atherosclerotic calcifications without aneurysmal dilatation. Veins: No significant venous abnormality is noted. Review of the MIP images confirms the above findings.  NON-VASCULAR Lower chest: No acute abnormality. Hepatobiliary: No focal liver abnormality is seen. No gallstones, gallbladder wall thickening, or biliary dilatation. Pancreas: Unremarkable. No pancreatic ductal dilatation or surrounding inflammatory changes. Spleen: Normal in size without focal abnormality. Adrenals/Urinary Tract: Left adrenal gland is within normal limits. Right adrenal gland is significantly enlarged secondary to a 6.9 cm fatty lesion consistent with myelolipoma. Kidneys demonstrate cystic change bilaterally. No specific follow-up is recommended. Delayed images demonstrate normal enhancement. No obstructive changes are  seen. The bladder is partially distended. Small lateral diverticulum is noted on the right. Stomach/Bowel: Diverticulosis without evidence of diverticulitis. No areas of active extravasation are identified to suggest acute GI hemorrhage. Stomach and small bowel are within normal limits. Lymphatic: No specific lymphadenopathy is noted. Reproductive: Prostate is unremarkable. Other: No abdominal wall hernia or abnormality. No abdominopelvic ascites. Musculoskeletal: Compression deformity at L1 is noted with increased kyphosis. This appears chronic in nature. No acute bony abnormality is noted. IMPRESSION: VASCULAR No acute vascular abnormality is noted. No findings to suggest active GI hemorrhage are seen. NON-VASCULAR 6.9 cm fatty lesion in the right adrenal gland consistent with myelolipoma. Surgical consultation is recommended. Biochemical evaluation may be helpful as well. Diverticulosis without diverticulitis. Again, no active GI hemorrhage is noted. Electronically Signed   By: Alcide Clever M.D.   On: 07/21/2022 22:57    Pending Labs Unresulted Labs (From admission, onward)     Start     Ordered   07/22/22 1100  Hemoglobin and hematocrit, blood  Now then every 6 hours,   R (with TIMED occurrences)      07/21/22 2355   07/22/22 0500  Basic metabolic panel  Tomorrow  morning,   R        07/21/22 2353   07/21/22 2353  HIV Antibody (routine testing w rflx)  (HIV Antibody (Routine testing w reflex) panel)  Once,   R        07/21/22 2353   07/21/22 2319  CBC  ONCE - STAT,   STAT        07/21/22 2318            Vitals/Pain Today's Vitals   07/21/22 2153 07/22/22 0030 07/22/22 0033 07/22/22 0034  BP:  115/75 115/75   Pulse: 91 84  84  Resp:  18    Temp:  98.7 F (37.1 C)    TempSrc:  Oral    SpO2: 96% 99%  99%  Weight:      Height:      PainSc:  0-No pain      Isolation Precautions No active isolations  Medications Medications  metoprolol tartrate (LOPRESSOR) tablet 25 mg (25 mg Oral Given 07/22/22 0031)  cholecalciferol (VITAMIN D3) 25 MCG (1000 UNIT) tablet 4,000 Units (has no administration in time range)  vitamin B-12 (CYANOCOBALAMIN) tablet 250 mcg (has no administration in time range)  0.9 %  sodium chloride infusion ( Intravenous New Bag/Given 07/22/22 0032)  acetaminophen (TYLENOL) tablet 650 mg (has no administration in time range)    Or  acetaminophen (TYLENOL) suppository 650 mg (has no administration in time range)  traZODone (DESYREL) tablet 25 mg (has no administration in time range)  magnesium hydroxide (MILK OF MAGNESIA) suspension 30 mL (has no administration in time range)  ondansetron (ZOFRAN) tablet 4 mg (has no administration in time range)    Or  ondansetron (ZOFRAN) injection 4 mg (has no administration in time range)  iohexol (OMNIPAQUE) 350 MG/ML injection 100 mL (100 mLs Intravenous Contrast Given 07/21/22 2242)    Mobility walks     Focused Assessments GI   R Recommendations: See Admitting Provider Note  Report given to:   Additional Notes: Pt is is no pain - began having dark red blood in diarrhea - 7 episodes so far 2x since he has been here,.  Pt is NPO at this time except for meds.

## 2022-07-22 NOTE — Assessment & Plan Note (Signed)
Will resume CPAP at bedtime.

## 2022-07-22 NOTE — Assessment & Plan Note (Signed)
-   We will continue allopurinol 

## 2022-07-23 LAB — CBC
HCT: 33.2 % — ABNORMAL LOW (ref 39.0–52.0)
Hemoglobin: 11.4 g/dL — ABNORMAL LOW (ref 13.0–17.0)
MCH: 33.3 pg (ref 26.0–34.0)
MCHC: 34.3 g/dL (ref 30.0–36.0)
MCV: 97.1 fL (ref 80.0–100.0)
Platelets: 149 10*3/uL — ABNORMAL LOW (ref 150–400)
RBC: 3.42 MIL/uL — ABNORMAL LOW (ref 4.22–5.81)
RDW: 13.5 % (ref 11.5–15.5)
WBC: 5.1 10*3/uL (ref 4.0–10.5)
nRBC: 0 % (ref 0.0–0.2)

## 2022-07-23 LAB — FOLATE: Folate: 11.7 ng/mL (ref >5.9)

## 2022-07-23 LAB — IRON AND TIBC
Iron: 35 ug/dL — ABNORMAL LOW (ref 45–182)
Saturation Ratios: 12 % — ABNORMAL LOW (ref 17.9–39.5)
TIBC: 302 ug/dL (ref 250–450)
UIBC: 267 ug/dL

## 2022-07-23 LAB — RENAL FUNCTION PANEL
Albumin: 2.8 g/dL — ABNORMAL LOW (ref 3.5–5.0)
Anion gap: 6 (ref 5–15)
BUN: 10 mg/dL (ref 8–23)
CO2: 26 mmol/L (ref 22–32)
Calcium: 8.5 mg/dL — ABNORMAL LOW (ref 8.9–10.3)
Chloride: 106 mmol/L (ref 98–111)
Creatinine, Ser: 0.94 mg/dL (ref 0.61–1.24)
GFR, Estimated: >60 mL/min (ref >60)
Glucose, Bld: 120 mg/dL — ABNORMAL HIGH (ref 70–99)
Phosphorus: 4.1 mg/dL (ref 2.5–4.6)
Potassium: 3.9 mmol/L (ref 3.5–5.1)
Sodium: 138 mmol/L (ref 135–145)

## 2022-07-23 LAB — RETICULOCYTES
Immature Retic Fract: 17.6 % — ABNORMAL HIGH (ref 2.3–15.9)
RBC.: 3.5 MIL/uL — ABNORMAL LOW (ref 4.22–5.81)
Retic Count, Absolute: 92.8 10*3/uL (ref 19.0–186.0)
Retic Ct Pct: 2.7 % (ref 0.4–3.1)

## 2022-07-23 LAB — MAGNESIUM: Magnesium: 2 mg/dL (ref 1.7–2.4)

## 2022-07-23 LAB — VITAMIN B12: Vitamin B-12: 673 pg/mL (ref 180–914)

## 2022-07-23 LAB — FERRITIN: Ferritin: 93 ng/mL (ref 24–336)

## 2022-07-23 MED ORDER — APIXABAN 5 MG PO TABS: 5.0000 mg | ORAL_TABLET | Freq: Two times a day (BID) | ORAL | 5 refills | Status: DC

## 2022-07-23 NOTE — Discharge Summary (Signed)
Physician Discharge Summary  Raymond Hurst ZOX:[id number] DOB: 1950-07-20 DOA: 07/21/2022  PCP: Sheliah Hatch, PA-C  Admit date: 07/21/2022 Discharge date: 07/23/2022 Admitted From: Home Disposition: Home Recommendations for Outpatient Follow-up:  Follow up with PCP in 1 week Check CMP and CBC in 1 week Ensure outpatient follow-up with general surgery for right adrenal mass. Please follow up on the following pending results: None  Home Health: Not indicated Equipment/Devices: Not indicated  Discharge Condition: Stable CODE STATUS: Full code  Follow-up Information     Sheliah Hatch, PA-C. Schedule an appointment as soon as possible for a visit in 1 week(s).   Specialty: Family Medicine Contact information: [address] [place] [address] [phone]         Surgery, [place]. Schedule an appointment as soon as possible for a visit in 1 week(s).   Specialty: General Surgery Why: Call to make an appointment for right adrenal mass Contact information: [address] [place] [address] [phone]                 Hospital course 72 year old M with PMH of A-fib on Eliquis, lymphedema, OSA on CPAP, TIA, GIB and morbid obesity presenting with multiple bloody bowel movements and lightheadedness, and admitted for acute lower GI bleed likely diverticular.  Initially, Hgb 14.7.  Hemodynamically stable.  CT angio without extravasation but diverticulosis and incidental finding of 6.9 cm flat lesion in the right adrenal gland consistent with myelolipoma for which surgical consultation was recommended.  GI consulted and patient was admitted.   The next day, patient continued to have bloody bowel movements.  Hemoglobin dropped to 10.9 partly due to IV fluid.  Hemoglobin improved to 11.8 after stopping IV fluid.  Evaluated by GI.  Advance to full liquid diet.  On the day of discharge, GI bleeding stopped.  Tolerated soft diet.  Hemoglobin  stabilized at 11.5.  Cleared for discharge by GI for outpatient follow-up.  GI recommended holding Eliquis for 1 week.  He will continue soft low fiber diet for about a week, and slowly advance to high-fiber diet after 1 week.  Advised to avoid over-the-counter pain medication other than plain Tylenol.  In regards to incidental finding of right adrenal mass, has been advised to follow-up with general surgery.    See individual problem list below for more.   Problems addressed during this hospitalization Principal Problem:   Lower GI bleeding Active Problems:   Paroxysmal atrial fibrillation (HCC)   OSA on CPAP   Gout   Morbid obesity due to excess calories (HCC)   Long term (current) use of anticoagulants   Right adrenal mass (HCC)   Diverticulosis of intestine with bleeding   Acute blood loss anemia   Acute blood loss anemia due to lower GI bleed in anticoagulated patient: Painless GI bleed.  CT angio without extravasation but diverticulosis.  Patient also on Eliquis.  Last dose the morning of 6/20.  Bleeding stopped.  Tolerated soft diet. Recent Labs    07/21/22 1859 07/21/22 1949 07/22/22 0010 07/22/22 1057 07/22/22 1727 07/22/22 2309 07/23/22 0626  HGB 14.7 14.4 12.4* 10.9* 11.8* 11.5* 11.4*  -Management as above.    Paroxysmal atrial fibrillation (HCC): Rate controlled -Continue home Lopressor. -Advised to hold Eliquis for 1 week due to GI bleed   Right adrenal mass: CT angio showed 6.9 cm right adrenal mass concerning for myelolipoma.  Surgical consultation recommended.  No clinical signs to suggest pheochromocytoma  or hyperaldosteronism -Outpatient follow-up with general surgery.  Discussed with patient and patient's wife.   OSA on CPAP -Continue CPAP at night   Chronic lymphedema: Right> left. -Continue compression socks   Gout -Continue allopurinol   GERD/history of GU/H. pylori -Continue PPI   Morbid obesity Body mass index is 43.4 kg/m. -Encourage  lifestyle change to lose weight.           Time spent 35 minutes  Vital signs Vitals:   07/22/22 1553 07/22/22 2008 07/23/22 0456 07/23/22 0736  BP: 124/81 111/70 110/63 137/72  Pulse: 65 90 80 68  Temp: 98 F (36.7 C) (!) 97.5 F (36.4 C) (!) 97.5 F (36.4 C) 98 F (36.7 C)  Resp: 18 15 17 18   Height:      Weight:      SpO2: 98% 96% 96% 99%  TempSrc:  Oral Oral   BMI (Calculated):         Discharge exam  GENERAL: No apparent distress.  Nontoxic. HEENT: MMM.  Vision and hearing grossly intact.  NECK: Supple.  No apparent JVD.  RESP:  No IWOB.  Fair aeration bilaterally. CVS:  RRR. Heart sounds normal.  ABD/GI/GU: BS+. Abd soft, NTND.  MSK/EXT:  Moves extremities.  Lymphedema, right greater than left. SKIN: no apparent skin lesion or wound NEURO: Awake and alert. Oriented appropriately.  No apparent focal neuro deficit. PSYCH: Calm. Normal affect.   Discharge Instructions Discharge Instructions     Diet - low sodium heart healthy   Complete by: As directed    Discharge instructions   Complete by: As directed    It has been a pleasure taking care of you!  You were hospitalized due to rectal bleeding likely from diverticulosis.  Your bleeding seems to have subsided.  We recommend holding Eliquis for 1 week.  We also recommend continuing soft low fiber residue diet (see a separate instruction for more on this) for about a week and slowly starting high-fiber residue diet after 1 week.  Follow-up with your primary care doctor in 1 to 2 weeks or sooner if needed.  Return to the hospital if you have major bleeding or other major symptoms concerning to you.  Avoid any over-the-counter pain medication other than plain Tylenol.  Noted that your CT showed large mass on your right adrenal gland.  Please follow-up with general surgery for further evaluation of this.   Take care,   Increase activity slowly   Complete by: As directed       Allergies as of 07/23/2022   No  Known Allergies      Medication List     TAKE these medications    acetaminophen 500 MG tablet Commonly known as: TYLENOL Take 500 mg by mouth every 6 (six) hours as needed for mild pain.   allopurinol 100 MG tablet Commonly known as: ZYLOPRIM Take 100 mg by mouth daily.   apixaban 5 MG Tabs tablet Commonly known as: Eliquis Take 1 tablet (5 mg total) by mouth 2 (two) times daily. Start taking on: July 30, 2022 What changed: These instructions start on July 30, 2022. If you are unsure what to do until then, ask your doctor or other care provider.   furosemide 40 MG tablet Commonly known as: LASIX Take 1 tablet (40 mg total) by mouth daily. What changed: how much to take   metoprolol tartrate 25 MG tablet Commonly known as: LOPRESSOR Take 1 tablet (25 mg total) by mouth 2 (two) times daily.  VITAMIN B 12 PO Take 1 tablet by mouth daily.   Vitamin D 125 MCG (5000 UT) Caps Take 5,000 Units by mouth daily.        Consultations: Gastroenterology  Procedures/Studies:   CT ANGIO GI BLEED  Result Date: 07/21/2022 CLINICAL DATA:  Dark bloody stools for several hours, initial encounter EXAM: CTA ABDOMEN AND PELVIS WITHOUT AND WITH CONTRAST TECHNIQUE: Multidetector CT imaging of the abdomen and pelvis was performed using the standard protocol during bolus administration of intravenous contrast. Multiplanar reconstructed images and MIPs were obtained and reviewed to evaluate the vascular anatomy. RADIATION DOSE REDUCTION: This exam was performed according to the departmental dose-optimization program which includes automated exposure control, adjustment of the mA and/or kV according to patient size and/or use of iterative reconstruction technique. CONTRAST:  OMNIPAQUE IOHEXOL 350 MG/ML SOLN COMPARISON:  None Available. FINDINGS: VASCULAR Aorta: Atherosclerotic calcifications are noted without aneurysmal dilatation or dissection. Celiac: Patent without evidence of  aneurysm, dissection, vasculitis or significant stenosis. SMA: Patent without evidence of aneurysm, dissection, vasculitis or significant stenosis. Renals: Both renal arteries are patent without evidence of aneurysm, dissection, vasculitis, fibromuscular dysplasia or significant stenosis. IMA: Patent without evidence of aneurysm, dissection, vasculitis or significant stenosis. Inflow: Iliacs demonstrate atherosclerotic calcifications without aneurysmal dilatation. Veins: No significant venous abnormality is noted. Review of the MIP images confirms the above findings. NON-VASCULAR Lower chest: No acute abnormality. Hepatobiliary: No focal liver abnormality is seen. No gallstones, gallbladder wall thickening, or biliary dilatation. Pancreas: Unremarkable. No pancreatic ductal dilatation or surrounding inflammatory changes. Spleen: Normal in size without focal abnormality. Adrenals/Urinary Tract: Left adrenal gland is within normal limits. Right adrenal gland is significantly enlarged secondary to a 6.9 cm fatty lesion consistent with myelolipoma. Kidneys demonstrate cystic change bilaterally. No specific follow-up is recommended. Delayed images demonstrate normal enhancement. No obstructive changes are seen. The bladder is partially distended. Small lateral diverticulum is noted on the right. Stomach/Bowel: Diverticulosis without evidence of diverticulitis. No areas of active extravasation are identified to suggest acute GI hemorrhage. Stomach and small bowel are within normal limits. Lymphatic: No specific lymphadenopathy is noted. Reproductive: Prostate is unremarkable. Other: No abdominal wall hernia or abnormality. No abdominopelvic ascites. Musculoskeletal: Compression deformity at L1 is noted with increased kyphosis. This appears chronic in nature. No acute bony abnormality is noted. IMPRESSION: VASCULAR No acute vascular abnormality is noted. No findings to suggest active GI hemorrhage are seen. NON-VASCULAR  6.9 cm fatty lesion in the right adrenal gland consistent with myelolipoma. Surgical consultation is recommended. Biochemical evaluation may be helpful as well. Diverticulosis without diverticulitis. Again, no active GI hemorrhage is noted. Electronically Signed   By: Alcide Clever M.D.   On: 07/21/2022 22:57       The results of significant diagnostics from this hospitalization (including imaging, microbiology, ancillary and laboratory) are listed below for reference.     Microbiology: No results found for this or any previous visit (from the past 240 hour(s)).   Labs:  CBC: Recent Labs  Lab 07/21/22 1859 07/21/22 1949 07/22/22 0010 07/22/22 1057 07/22/22 1727 07/22/22 2309 07/23/22 0626  WBC 8.5 8.4 6.9  --   --   --  5.1  HGB 14.7 14.4 12.4* 10.9* 11.8* 11.5* 11.4*  HCT 43.9 42.4 37.4* 32.6* 35.0* 34.3* 33.2*  MCV 99.1 97.9 96.6  --   --   --  97.1  PLT 195 179 167  --   --   --  149*   BMP &GFR Recent Labs  Lab 07/21/22 1859 07/22/22 0010 07/23/22 0626  NA 134* 136 138  K 3.9 4.5 3.9  CL 102 102 106  CO2 25 25 26   GLUCOSE 75 107* 120*  BUN 9 12 10   CREATININE 1.04 1.00 0.94  CALCIUM 8.8* 8.8* 8.5*  MG  --   --  2.0  PHOS  --   --  4.1   Estimated Creatinine Clearance: 108.2 mL/min (by C-G formula based on SCr of 0.94 mg/dL). Liver & Pancreas: Recent Labs  Lab 07/21/22 1859 07/23/22 0626  AST 20  --   ALT 17  --   ALKPHOS 42  --   BILITOT 0.7  --   PROT 6.9  --   ALBUMIN 3.2* 2.8*   No results for input(s): "LIPASE", "AMYLASE" in the last 168 hours. No results for input(s): "AMMONIA" in the last 168 hours. Diabetic: No results for input(s): "HGBA1C" in the last 72 hours. No results for input(s): "GLUCAP" in the last 168 hours. Cardiac Enzymes: No results for input(s): "CKTOTAL", "CKMB", "CKMBINDEX", "TROPONINI" in the last 168 hours. No results for input(s): "PROBNP" in the last 8760 hours. Coagulation Profile: No results for input(s): "INR",  "PROTIME" in the last 168 hours. Thyroid Function Tests: No results for input(s): "TSH", "T4TOTAL", "FREET4", "T3FREE", "THYROIDAB" in the last 72 hours. Lipid Profile: No results for input(s): "CHOL", "HDL", "LDLCALC", "TRIG", "CHOLHDL", "LDLDIRECT" in the last 72 hours. Anemia Panel: Recent Labs    07/23/22 0626  VITAMINB12 673  FOLATE 11.7  FERRITIN 93  TIBC 302  IRON 35*  RETICCTPCT 2.7   Urine analysis: No results found for: "COLORURINE", "APPEARANCEUR", "LABSPEC", "PHURINE", "GLUCOSEU", "HGBUR", "BILIRUBINUR", "KETONESUR", "PROTEINUR", "UROBILINOGEN", "NITRITE", "LEUKOCYTESUR" Sepsis Labs: Invalid input(s): "PROCALCITONIN", "LACTICIDVEN"   SIGNED:  Almon Hercules, MD  [facility] 07/23/2022, 5:59 PM

## 2022-07-23 NOTE — Plan of Care (Signed)

## 2022-07-23 NOTE — Plan of Care (Signed)

## 2022-08-03 DIAGNOSIS — I83811 Varicose veins of right lower extremities with pain: Secondary | ICD-10-CM | POA: Diagnosis not present

## 2022-08-03 DIAGNOSIS — M7989 Other specified soft tissue disorders: Secondary | ICD-10-CM | POA: Diagnosis not present

## 2022-08-03 DIAGNOSIS — I83891 Varicose veins of right lower extremities with other complications: Secondary | ICD-10-CM | POA: Diagnosis not present

## 2022-08-11 DIAGNOSIS — N289 Disorder of kidney and ureter, unspecified: Secondary | ICD-10-CM | POA: Diagnosis not present

## 2022-08-11 DIAGNOSIS — D500 Iron deficiency anemia secondary to blood loss (chronic): Secondary | ICD-10-CM | POA: Diagnosis not present

## 2022-08-11 DIAGNOSIS — I4891 Unspecified atrial fibrillation: Secondary | ICD-10-CM | POA: Diagnosis not present

## 2022-08-11 DIAGNOSIS — Z9181 History of falling: Secondary | ICD-10-CM | POA: Diagnosis not present

## 2022-08-11 DIAGNOSIS — Z7901 Long term (current) use of anticoagulants: Secondary | ICD-10-CM | POA: Diagnosis not present

## 2022-08-11 DIAGNOSIS — E559 Vitamin D deficiency, unspecified: Secondary | ICD-10-CM | POA: Diagnosis not present

## 2022-08-11 DIAGNOSIS — Z8719 Personal history of other diseases of the digestive system: Secondary | ICD-10-CM | POA: Diagnosis not present

## 2022-08-15 ENCOUNTER — Encounter: Payer: Self-pay | Admitting: Cardiology

## 2022-08-15 ENCOUNTER — Ambulatory Visit: Payer: Medicare HMO | Attending: Cardiology | Admitting: Cardiology

## 2022-08-15 VITALS — BP 124/72 | HR 98 | Ht 72.0 in | Wt 338.4 lb

## 2022-08-15 DIAGNOSIS — I890 Lymphedema, not elsewhere classified: Secondary | ICD-10-CM

## 2022-08-15 DIAGNOSIS — G4733 Obstructive sleep apnea (adult) (pediatric): Secondary | ICD-10-CM

## 2022-08-15 DIAGNOSIS — I4821 Permanent atrial fibrillation: Secondary | ICD-10-CM | POA: Diagnosis not present

## 2022-08-15 NOTE — Addendum Note (Signed)
Addended by: Luellen Pucker on: 08/15/2022 02:42 PM   Modules accepted: Orders

## 2022-08-15 NOTE — Patient Instructions (Signed)
Medication Instructions:  Your physician recommends that you continue on your current medications as directed. Please refer to the Current Medication list given to you today.  *If you need a refill on your cardiac medications before your next appointment, please call your pharmacy*   Lab Work: None.  If you have labs (blood work) drawn today and your tests are completely normal, you will receive your results only by: MyChart Message (if you have MyChart) OR A paper copy in the mail If you have any lab test that is abnormal or we need to change your treatment, we will call you to review the results.   Testing/Procedures: None.   Follow-Up: At [facility], you and your health needs are our priority.  As part of our continuing mission to provide you with exceptional heart care, we have created designated Provider Care Teams.  These Care Teams include your primary Cardiologist (physician) and Advanced Practice Providers (APPs -  Physician Assistants and Nurse Practitioners) who all work together to provide you with the care you need, when you need it.  We recommend signing up for the patient portal called "MyChart".  Sign up information is provided on this After Visit Summary.  MyChart is used to connect with patients for Virtual Visits (Telemedicine).  Patients are able to view lab/test results, encounter notes, upcoming appointments, etc.  Non-urgent messages can be sent to your provider as well.   To learn more about what you can do with MyChart, go to [email or web address].    Your next appointment:   1 year(s)  Provider:   Dr. Armanda Magic, MD   Other Instructions Dr. Mayford Knife has ordered a bipap titration for you. Someone from the sleep center will call to get this scheduled for you.

## 2022-08-15 NOTE — Progress Notes (Addendum)
Cardiology Office Note  Date: 08/15/2022   ID: STEFFAN CANIGLIA, DOB Jun 23, 1950, MRN [id number]  PCP:  Sheliah Hatch, PA-C  Cardiologist:  None Electrophysiologist:  None   Chief Complaint  Patient presents with   Atrial Fibrillation   Sleep Apnea    History of Present Illness: Raymond Hurst is a 72 y.o. male with a hx of permanent atrial fibrillation, TIA in 2020 and lymphedema.  At last office visit I ordered a sleep study due to snoring and morbid obesity.  Sleep study showed severe obstructive sleep apnea with an AHI of 54/h with no significant central events.  He had nocturnal hypoxemia with O2 sat nadir of 70%.  He was referred for CPAP titration and titrated to 7cm H2O and was placed ultimately on auto CPAP from 4 to 15 cm H2O. At last office visit he said that the auto CPAP was too much and he was changed back to down to 7 cm H2O.  He is here today for followup and is doing well.  He denies any chest pain or pressure, SOB, DOE, PND, orthopnea,  dizziness, palpitations or syncope. He has chronic LE edema related to lymphedema.He is compliant with his meds and is tolerating meds with no SE.    He is doing well with his PAP device and thinks that he has gotten used to it.  He tolerates the mask but  feels the pressure is stil too high and does not think that anyone changed him back to 7cm H2O..  Since going on PAP he feels rested in the am and has no significant daytime sleepiness but does nap during the day some.  He denies any significant mouth or nasal dryness or nasal congestion.  He does not think that he snores.    Past Medical History:  Diagnosis Date   Atrial fibrillation (HCC)    Cellulitis of right lower leg    History of TIA (transient ischemic attack)    May 2020   Lymphedema    OSA on CPAP    severe obstructive sleep apnea with an AHI of 54/h with no significant central events.  He had nocturnal hypoxemia with O2 sat nadir of 70%. Now on CPAP    Past  Surgical History:  Procedure Laterality Date   INNER EAR SURGERY     ORTHOPEDIC SURGERY      Current Outpatient Medications  Medication Sig Dispense Refill   acetaminophen (TYLENOL) 500 MG tablet Take 500 mg by mouth every 6 (six) hours as needed for mild pain.     allopurinol (ZYLOPRIM) 100 MG tablet Take 100 mg by mouth daily.     apixaban (ELIQUIS) 5 MG TABS tablet Take 1 tablet (5 mg total) by mouth 2 (two) times daily. 60 tablet 5   Cholecalciferol (VITAMIN D) 125 MCG (5000 UT) CAPS Take 5,000 Units by mouth daily.     Cyanocobalamin (VITAMIN B 12 PO) Take 1 tablet by mouth daily.     furosemide (LASIX) 40 MG tablet Take 1 tablet (40 mg total) by mouth daily. (Patient taking differently: Take 20 mg by mouth daily.) 90 tablet 3   metoprolol tartrate (LOPRESSOR) 25 MG tablet Take 1 tablet (25 mg total) by mouth 2 (two) times daily. 180 tablet 3   No current facility-administered medications for this visit.   Allergies:  Patient has no known allergies.   ROS: No frank syncope.  Physical Exam: VS:  BP 124/72   Pulse 98  Ht 6' (1.829 m)   Wt (!) 338 lb 6.4 oz (153.5 kg)   SpO2 98%   BMI 45.90 kg/m , BMI Body mass index is 45.9 kg/m.  Wt Readings from Last 3 Encounters:  08/15/22 (!) 338 lb 6.4 oz (153.5 kg)  07/21/22 (!) 320 lb (145.2 kg)  05/10/22 (!) 344 lb 6.4 oz (156.2 kg)  GEN: Well nourished, well developed in no acute distress HEENT: Normal NECK: No JVD; No carotid bruits LYMPHATICS: No lymphadenopathy CARDIAC:RRR, no murmurs, rubs, gallops RESPIRATORY:  Clear to auscultation without rales, wheezing or rhonchi  ABDOMEN: Soft, non-tender, non-distended MUSCULOSKELETAL:  No edema; No deformity  SKIN: Warm and dry NEUROLOGIC:  Alert and oriented x 3 PSYCHIATRIC:  Normal affect   Recent Labwork: 07/21/2022: ALT 17; AST 20 07/23/2022: BUN 10; Creatinine, Ser 0.94; Hemoglobin 11.4; Magnesium 2.0; Platelets 149; Potassium 3.9; Sodium 138     Component Value  Date/Time   CHOL 129 06/08/2018 0634   TRIG 84 06/08/2018 0634   HDL 31 (L) 06/08/2018 0634   CHOLHDL 4.2 06/08/2018 0634   VLDL 17 06/08/2018 0634   LDLCALC 81 06/08/2018 0634    Other Studies Reviewed Today:  Echocardiogram 05/06/2019: 1. Left ventricular ejection fraction, by estimation, is 55 to 60%. The  left ventricle has normal function. The left ventricle has no regional  wall motion abnormalities. There is mild left ventricular hypertrophy.  Left ventricular diastolic parameters  are indeterminate.   2. Right ventricular systolic function is normal. The right ventricular  size is normal.   3. The mitral valve is normal in structure. Trivial mitral valve  regurgitation. No evidence of mitral stenosis.   4. The aortic valve has an indeterminant number of cusps. Aortic valve  regurgitation is not visualized. No aortic stenosis is present.   5. Aortic dilatation noted. There is mild to moderate dilatation of the  aortic root measuring 43 mm.   6. Indeterminant PASP, inadequate TR jet.   7. The inferior vena cava is normal in size with greater than 50%  respiratory variability, suggesting right atrial pressure of 3 mmHg.   Lower extremity arterial Dopplers 09/06/2019: IMPRESSION: Ultrasound demonstrates no segmental occlusion or high-grade stenosis.  Assessment and Plan:   Permanent atrial fibrillation -He remains in atrial fibrillation with good heart rate control and denies any palpitations -He denies any major bleeding issues on DOAC except for during a bout of diverticulitis a few months ago that is now resolved -CHADS2VASC score of 3 (age>65 and TIA) -Continue drug management with Eliquis 5 mg twice daily and Lopressor 25 mg twice daily with as needed refills -I have personally reviewed and interpreted outside labs performed by patient's PCP showing  Serum creatinine 0.94 and potassium 3.9, hemoglobin 11.4 on 07/23/2022  2.  Lymphedema -this is chronic and very stable  on diuretics -Continue prescription management with Lasix 40 mg daily with as needed refills  3.  OSA - The patient is tolerating PAP therapy well without any problems. The PAP download performed by his DME was personally reviewed and interpreted by me today and showed an AHI of 12.4/hr on auto CPAP from 4 to 20 cm H2O with 53% compliance in using more than 4 hours nightly.  The patient has been using and benefiting from PAP use and will continue to benefit from therapy.  -since he does not tolerate the high CPAP pressure on auto and AHI was too high on both auto and set pressure I think he needs to go  back to get a BiPAP titration -encouraged him to be more compliant with his device  Followup with me in 1 year   Medication Adjustments/Labs and Tests Ordered: Current medicines are reviewed at length with the patient today.  Concerns regarding medicines are outlined above.   Tests Ordered: No orders of the defined types were placed in this encounter.    Medication Changes: No orders of the defined types were placed in this encounter.    Disposition:  Follow up virtual in 3 months  Signed, Jonelle Sidle, MD, [facility] 08/15/2022 1:31 PM    [facility] [address] [place], [place], [place] [address] Phone: [phone]; Fax: [phone]

## 2022-08-18 DIAGNOSIS — E278 Other specified disorders of adrenal gland: Secondary | ICD-10-CM | POA: Diagnosis not present

## 2022-08-25 DIAGNOSIS — E278 Other specified disorders of adrenal gland: Secondary | ICD-10-CM | POA: Diagnosis not present

## 2022-08-31 DIAGNOSIS — I872 Venous insufficiency (chronic) (peripheral): Secondary | ICD-10-CM | POA: Diagnosis not present

## 2022-09-09 DIAGNOSIS — E278 Other specified disorders of adrenal gland: Secondary | ICD-10-CM | POA: Diagnosis not present

## 2022-09-09 DIAGNOSIS — R635 Abnormal weight gain: Secondary | ICD-10-CM | POA: Diagnosis not present

## 2022-09-09 DIAGNOSIS — Z8639 Personal history of other endocrine, nutritional and metabolic disease: Secondary | ICD-10-CM | POA: Diagnosis not present

## 2022-09-13 DIAGNOSIS — Z8639 Personal history of other endocrine, nutritional and metabolic disease: Secondary | ICD-10-CM | POA: Diagnosis not present

## 2022-09-13 DIAGNOSIS — E278 Other specified disorders of adrenal gland: Secondary | ICD-10-CM | POA: Diagnosis not present

## 2022-09-22 ENCOUNTER — Telehealth: Payer: Self-pay | Admitting: *Deleted

## 2022-09-22 NOTE — Telephone Encounter (Signed)
Prior Authorization for BIPAP TITRATION sent to [facility] via web portal. Tracking Number .   Approved-Authorization #[id number]  Tracking [id number].

## 2022-09-26 DIAGNOSIS — I83892 Varicose veins of left lower extremities with other complications: Secondary | ICD-10-CM | POA: Diagnosis not present

## 2022-09-26 DIAGNOSIS — I83812 Varicose veins of left lower extremities with pain: Secondary | ICD-10-CM | POA: Diagnosis not present

## 2022-09-26 DIAGNOSIS — M7989 Other specified soft tissue disorders: Secondary | ICD-10-CM | POA: Diagnosis not present

## 2022-09-26 DIAGNOSIS — I890 Lymphedema, not elsewhere classified: Secondary | ICD-10-CM | POA: Diagnosis not present

## 2022-11-16 DIAGNOSIS — I83892 Varicose veins of left lower extremities with other complications: Secondary | ICD-10-CM | POA: Diagnosis not present

## 2022-11-16 DIAGNOSIS — I83812 Varicose veins of left lower extremities with pain: Secondary | ICD-10-CM | POA: Diagnosis not present

## 2022-11-16 DIAGNOSIS — R600 Localized edema: Secondary | ICD-10-CM | POA: Diagnosis not present

## 2022-12-05 ENCOUNTER — Ambulatory Visit (HOSPITAL_BASED_OUTPATIENT_CLINIC_OR_DEPARTMENT_OTHER): Payer: Medicare HMO | Admitting: Cardiology

## 2022-12-13 ENCOUNTER — Telehealth: Payer: Self-pay

## 2022-12-13 ENCOUNTER — Other Ambulatory Visit (HOSPITAL_COMMUNITY): Payer: Self-pay

## 2022-12-13 ENCOUNTER — Telehealth: Payer: Self-pay | Admitting: Cardiology

## 2022-12-13 NOTE — Telephone Encounter (Signed)
Patient calling the office for samples of medication:   1.  What medication and dosage are you requesting samples for?  apixaban (ELIQUIS) 5 MG TABS tablet    2.  Are you currently out of this medication?   Yes (Pt is in the doughnut hole and wanting to know if he can get patient assistance)

## 2022-12-13 NOTE — Telephone Encounter (Addendum)
INQUIRING ABOUT ASSISTANCE. SENT MYCHART MESSAGE WITH INFO NEEDED FOR APPLICATION

## 2022-12-14 ENCOUNTER — Telehealth: Payer: Self-pay | Admitting: *Deleted

## 2022-12-14 MED ORDER — APIXABAN 5 MG PO TABS: 5.0000 mg | ORAL_TABLET | Freq: Two times a day (BID) | ORAL | 0 refills | Status: DC

## 2022-12-14 MED ORDER — APIXABAN 5 MG PO TABS: 5.0000 mg | ORAL_TABLET | Freq: Two times a day (BID) | ORAL | Status: DC

## 2022-12-14 NOTE — Telephone Encounter (Signed)
Sample request for Eliquis received. Indication: AF Last office visit: 08/15/22  Kym Groom MD Scr: 0.94 on 07/23/22  Epic Age: 72 Weight: 153.5kg  Based on above findings Eliquis 5mg  twice daily is the appropriate dose.  OK to provide samples if available.

## 2022-12-14 NOTE — Telephone Encounter (Signed)
Samples provided and patient assistant forms given

## 2022-12-14 NOTE — Addendum Note (Signed)
Addended by: Carmelina Paddock on: 12/14/2022 05:03 PM   Modules accepted: Orders

## 2022-12-19 NOTE — Telephone Encounter (Signed)
PAP: PAP application for ELIQUIS, [employer] ([employer])) has been mailed to pt's home address on file. Will fax provider portion of application to provider's office when pt's portion is received.

## 2023-01-16 ENCOUNTER — Telehealth: Payer: Self-pay | Admitting: Cardiology

## 2023-01-16 NOTE — Telephone Encounter (Signed)
Do you know anything about his pt assistance forms.  I have not seen them.

## 2023-01-16 NOTE — Telephone Encounter (Signed)
Pt is calling checking on his patient assistance. He is completely out of his Eliquis

## 2023-01-16 NOTE — Telephone Encounter (Signed)
Patient calling the office for samples of medication:   1.  What medication and dosage are you requesting samples for?   apixaban (ELIQUIS) 5 MG TABS tablet    2.  Are you currently out of this medication? Yes   Will be able to pick up at [facility].   Patient is still in donut hole.

## 2023-01-17 MED ORDER — APIXABAN 5 MG PO TABS: 5.0000 mg | ORAL_TABLET | Freq: Two times a day (BID) | ORAL | 0 refills | Status: DC

## 2023-01-17 NOTE — Telephone Encounter (Signed)
Spoke with wife who says patient wants to follow up at the [place] office. Per wife, patient would like to follow Dr. Mayford Knife for sleep apnea and follow up with [facility] cardiologist for other cardiac problems. Per wife, patient would like to see Randall An PA only.  Per wife, patient assistance application was left at the [facility] office.  Will forward to [facility] office triage to follow up on patient assistance and eliquis sample request while awaiting patient assistance response.

## 2023-01-17 NOTE — Telephone Encounter (Signed)
Samples left at the front desk for patient pickup.

## 2023-01-17 NOTE — Telephone Encounter (Signed)
Need to verify who PCP cardiologist is.

## 2023-01-17 NOTE — Telephone Encounter (Signed)
Pt wife calling back °

## 2023-01-19 NOTE — Telephone Encounter (Signed)
Returned call to wife regarding patient assistance. No answer. Mailbox is full.

## 2023-01-26 DIAGNOSIS — H2513 Age-related nuclear cataract, bilateral: Secondary | ICD-10-CM | POA: Diagnosis not present

## 2023-01-26 DIAGNOSIS — H43393 Other vitreous opacities, bilateral: Secondary | ICD-10-CM | POA: Diagnosis not present

## 2023-01-26 DIAGNOSIS — Z0100 Encounter for examination of eyes and vision without abnormal findings: Secondary | ICD-10-CM | POA: Diagnosis not present

## 2023-01-28 ENCOUNTER — Encounter (HOSPITAL_BASED_OUTPATIENT_CLINIC_OR_DEPARTMENT_OTHER): Payer: Self-pay

## 2023-01-30 ENCOUNTER — Ambulatory Visit (HOSPITAL_BASED_OUTPATIENT_CLINIC_OR_DEPARTMENT_OTHER): Payer: Medicare HMO | Attending: Cardiology | Admitting: Cardiology

## 2023-01-30 DIAGNOSIS — I4891 Unspecified atrial fibrillation: Secondary | ICD-10-CM | POA: Diagnosis not present

## 2023-01-30 DIAGNOSIS — I493 Ventricular premature depolarization: Secondary | ICD-10-CM | POA: Insufficient documentation

## 2023-01-30 DIAGNOSIS — G4733 Obstructive sleep apnea (adult) (pediatric): Secondary | ICD-10-CM | POA: Insufficient documentation

## 2023-01-30 NOTE — Telephone Encounter (Signed)
Spoke with wife and they will attempt to buy Eliquis after the first of the year. If unable to afford Eliquis at that time they will fill out patient assistance forms.

## 2023-01-30 NOTE — Telephone Encounter (Signed)
Following up on pt assistance for Eliquis. Spoke with wife and pt who state that forms were dropped off in the [place] office and asked to be faxed to the [place] office. Will contact nursing to see if forms are in [place] and if not wife is ok with resubmitting forms.

## 2023-02-03 MED ORDER — APIXABAN 5 MG PO TABS: 5.0000 mg | ORAL_TABLET | Freq: Two times a day (BID) | ORAL | 0 refills | Status: DC

## 2023-02-03 NOTE — Addendum Note (Signed)
 Addended by: Kerney Elbe on: 02/03/2023 01:15 PM   Modules accepted: Orders

## 2023-02-03 NOTE — Telephone Encounter (Signed)
 Spoke with wife who states that Eliquis is going to cost pt $298 monthly. Wife states that she can not afford this. She will Development worker, international aid. Two weeks of samples provided along with patient assistance forms.

## 2023-02-03 NOTE — Telephone Encounter (Signed)
 Patient's wife is following up requesting to speak with LPN regarding patient assistance forms.

## 2023-02-07 DIAGNOSIS — F1020 Alcohol dependence, uncomplicated: Secondary | ICD-10-CM | POA: Diagnosis not present

## 2023-02-07 DIAGNOSIS — E790 Hyperuricemia without signs of inflammatory arthritis and tophaceous disease: Secondary | ICD-10-CM | POA: Diagnosis not present

## 2023-02-07 DIAGNOSIS — R600 Localized edema: Secondary | ICD-10-CM | POA: Diagnosis not present

## 2023-02-07 DIAGNOSIS — I4891 Unspecified atrial fibrillation: Secondary | ICD-10-CM | POA: Diagnosis not present

## 2023-02-07 DIAGNOSIS — Z23 Encounter for immunization: Secondary | ICD-10-CM | POA: Diagnosis not present

## 2023-02-07 DIAGNOSIS — R059 Cough, unspecified: Secondary | ICD-10-CM | POA: Diagnosis not present

## 2023-02-07 DIAGNOSIS — Z0000 Encounter for general adult medical examination without abnormal findings: Secondary | ICD-10-CM | POA: Diagnosis not present

## 2023-02-07 DIAGNOSIS — Z6841 Body Mass Index (BMI) 40.0 and over, adult: Secondary | ICD-10-CM | POA: Diagnosis not present

## 2023-02-08 ENCOUNTER — Telehealth: Payer: Self-pay

## 2023-02-08 NOTE — Telephone Encounter (Signed)
 Left VM with callback number for patient to receive PAP Titration results. Order will be sent to [facility] with patient confirmation of CPAP therapy.

## 2023-02-08 NOTE — Telephone Encounter (Signed)
-----   Message from Armanda Magic sent at 02/05/2023  8:38 PM EST ----- Please let patient know that they had a successful PAP titration and let DME know that orders are in EPIC.  Please set up 6 week OV with me.

## 2023-02-21 DIAGNOSIS — I83893 Varicose veins of bilateral lower extremities with other complications: Secondary | ICD-10-CM | POA: Diagnosis not present

## 2023-02-21 DIAGNOSIS — I890 Lymphedema, not elsewhere classified: Secondary | ICD-10-CM | POA: Diagnosis not present

## 2023-03-01 ENCOUNTER — Other Ambulatory Visit: Payer: Self-pay | Admitting: Internal Medicine

## 2023-03-01 DIAGNOSIS — R059 Cough, unspecified: Secondary | ICD-10-CM | POA: Diagnosis not present

## 2023-03-01 DIAGNOSIS — E278 Other specified disorders of adrenal gland: Secondary | ICD-10-CM

## 2023-03-08 ENCOUNTER — Telehealth: Payer: Self-pay | Admitting: Cardiology

## 2023-03-08 NOTE — Telephone Encounter (Signed)
 Pt requesting samples of Eliquis  and states that Eliquis  pt assistance has the wrong date. Pt will pick up Eliquis  from pharmacy and I will refax pt assistance with correct day on tomorrow.

## 2023-03-08 NOTE — Telephone Encounter (Signed)
 Pt c/o medication issue:  1. Name of Medication:   apixaban  (ELIQUIS ) 5 MG TABS tablet    2. How are you currently taking this medication (dosage and times per day)? As written  3. Are you having a reaction (difficulty breathing--STAT)? no  4. What is your medication issue? Having probably with the pt assistance. Wants to talk to Lukisha today

## 2023-03-09 ENCOUNTER — Telehealth: Payer: Self-pay

## 2023-03-09 MED ORDER — APIXABAN 5 MG PO TABS: 5.0000 mg | ORAL_TABLET | Freq: Two times a day (BID) | ORAL | 0 refills | Status: DC

## 2023-03-09 NOTE — Telephone Encounter (Signed)
 Patient came into the office for samples of medication:   1.  What medication and dosage are you requesting samples for? Eliquis  5mg   2.  Are you currently out of this medication?   Yes, pt has been out of Eliquis  since Tuesday and needs samples. He is waiting on approval for assistance. He is wondering if anyone in [place] has any samples he would be able  to get.   Best phone # [phone]

## 2023-03-09 NOTE — Telephone Encounter (Signed)
 Called left message on patient voiced mail -  left message that there was one box available for pick up. Patient can call back or pick medication

## 2023-03-09 NOTE — Telephone Encounter (Signed)
 Sample request for Eliquis  received. Indication:Afib  Last office visit: 08/15/22 Raymond Hurst)  Scr: 0.94 (07/23/22)  Age: 73 Weight: 153.3kg  Eliquis  5mg  BID is appropriate dose.

## 2023-03-10 NOTE — Telephone Encounter (Signed)
 Patient identification verified by 2 forms. Hilton Lucky, RN    Called and spoke to patient  Informed patient one box of sample available for pick up at front desk  Patient verbalized understanding, no questions at this time

## 2023-03-16 ENCOUNTER — Telehealth: Payer: Self-pay | Admitting: *Deleted

## 2023-03-16 MED ORDER — APIXABAN 5 MG PO TABS: 5.0000 mg | ORAL_TABLET | Freq: Two times a day (BID) | ORAL | 5 refills | Status: DC

## 2023-03-16 NOTE — Telephone Encounter (Signed)
Error

## 2023-03-16 NOTE — Addendum Note (Signed)
Addended by: Satira Sark on: 03/16/2023 10:08 AM   Modules accepted: Orders

## 2023-03-16 NOTE — Telephone Encounter (Signed)
Pt last saw Dr Mayford Knife 08/15/22, last labs 07/23/22 Creat 0.94, age 73, weight 153.3kg, based on specified criteria pt is on appropriate dosage of Eliquis 5mg  BID for afib.  [facility].

## 2023-03-16 NOTE — Telephone Encounter (Signed)
Received a fax from [employer] stating that pt has been denied for assistance d/t not meeting his 3 % out of pocket expenses of $997. Pt is [facility]

## 2023-03-24 DIAGNOSIS — M7989 Other specified soft tissue disorders: Secondary | ICD-10-CM | POA: Diagnosis not present

## 2023-03-24 DIAGNOSIS — I890 Lymphedema, not elsewhere classified: Secondary | ICD-10-CM | POA: Diagnosis not present

## 2023-03-24 DIAGNOSIS — I83813 Varicose veins of bilateral lower extremities with pain: Secondary | ICD-10-CM | POA: Diagnosis not present

## 2023-04-18 ENCOUNTER — Ambulatory Visit
Admission: RE | Admit: 2023-04-18 | Discharge: 2023-04-18 | Disposition: A | Discharge status: Home / Self Care | Source: Ambulatory Visit | Attending: Internal Medicine | Admitting: Internal Medicine

## 2023-04-18 DIAGNOSIS — I700 Atherosclerosis of aorta: Secondary | ICD-10-CM | POA: Diagnosis not present

## 2023-04-18 DIAGNOSIS — D3501 Benign neoplasm of right adrenal gland: Secondary | ICD-10-CM | POA: Diagnosis not present

## 2023-04-18 DIAGNOSIS — N281 Cyst of kidney, acquired: Secondary | ICD-10-CM | POA: Diagnosis not present

## 2023-04-18 DIAGNOSIS — E278 Other specified disorders of adrenal gland: Secondary | ICD-10-CM

## 2023-05-31 ENCOUNTER — Encounter: Payer: Self-pay | Admitting: Nurse Practitioner

## 2023-05-31 ENCOUNTER — Ambulatory Visit: Attending: Nurse Practitioner | Admitting: Nurse Practitioner

## 2023-05-31 VITALS — BP 120/62 | HR 73 | Ht 72.0 in | Wt 303.8 lb

## 2023-05-31 DIAGNOSIS — I890 Lymphedema, not elsewhere classified: Secondary | ICD-10-CM | POA: Diagnosis not present

## 2023-05-31 DIAGNOSIS — I83893 Varicose veins of bilateral lower extremities with other complications: Secondary | ICD-10-CM | POA: Diagnosis not present

## 2023-05-31 DIAGNOSIS — R0689 Other abnormalities of breathing: Secondary | ICD-10-CM

## 2023-05-31 DIAGNOSIS — R053 Chronic cough: Secondary | ICD-10-CM | POA: Diagnosis not present

## 2023-05-31 DIAGNOSIS — R600 Localized edema: Secondary | ICD-10-CM | POA: Diagnosis not present

## 2023-05-31 DIAGNOSIS — R634 Abnormal weight loss: Secondary | ICD-10-CM

## 2023-05-31 DIAGNOSIS — R059 Cough, unspecified: Secondary | ICD-10-CM

## 2023-05-31 DIAGNOSIS — I4821 Permanent atrial fibrillation: Secondary | ICD-10-CM | POA: Diagnosis not present

## 2023-05-31 DIAGNOSIS — Z8673 Personal history of transient ischemic attack (TIA), and cerebral infarction without residual deficits: Secondary | ICD-10-CM

## 2023-05-31 DIAGNOSIS — R0989 Other specified symptoms and signs involving the circulatory and respiratory systems: Secondary | ICD-10-CM | POA: Diagnosis not present

## 2023-05-31 DIAGNOSIS — I87393 Chronic venous hypertension (idiopathic) with other complications of bilateral lower extremity: Secondary | ICD-10-CM | POA: Diagnosis not present

## 2023-05-31 MED ORDER — FUROSEMIDE 40 MG PO TABS: 40.0000 mg | ORAL_TABLET | Freq: Every day | ORAL | 3 refills | Status: DC

## 2023-05-31 NOTE — Patient Instructions (Addendum)
 Medication Instructions:  Your physician has recommended you make the following change to your medication regimen: INCREASE Lasix  to 40 mg daily.    Labwork: By the end of this week   Testing/Procedures: A chest x-ray takes a picture of the organs and structures inside the chest, including the heart, lungs, and blood vessels. This test can show several things, including, whether the heart is enlarges; whether fluid is building up in the lungs; and whether pacemaker / defibrillator leads are still in place. Your physician has requested that you have an echocardiogram. Echocardiography is a painless test that uses sound waves to create images of your heart. It provides your doctor with information about the size and shape of your heart and how well your heart's chambers and valves are working. This procedure takes approximately one hour. There are no restrictions for this procedure. Please do NOT wear cologne, perfume, aftershave, or lotions (deodorant is allowed). Please arrive 15 minutes prior to your appointment time.  Please note: We ask at that you not bring children with you during ultrasound (echo/ vascular) testing. Due to room size and safety concerns, children are not allowed in the ultrasound rooms during exams. Our front office staff cannot provide observation of children in our lobby area while testing is being conducted. An adult accompanying a patient to their appointment will only be allowed in the ultrasound room at the discretion of the ultrasound technician under special circumstances. We apologize for any inconvenience.  Follow-Up: Your physician recommends that you schedule a follow-up appointment in: 4-6 weeks   Any Other Special Instructions Will Be Listed Below (If Applicable).   If you need a refill on your cardiac medications before your next appointment, please call your pharmacy.

## 2023-05-31 NOTE — Progress Notes (Addendum)
 Cardiology Office Note:  .   Date:  05/31/2023 ID:  Raymond Hurst, DOB [birth year], MRN [id number] PCP: Raymond Hurst  [facility] Providers Cardiologist:  Teddie Favre, MD    History of Present Illness: .   Raymond Hurst is a 73 y.o. male with a PMH of permanent A-fib, TIA in 2020, lymphedema, and OSA on CPAP (follows Dr. Micael Hurst), who presents today for 1 year follow-up.  Last seen by Raymond Hays, PA-C on November 17, 2021. Overall doing well. Saw Dr. Micael Hurst on August 15, 2022. Was continuing to do well at the time.   Today he presents for 1 year follow-up. Pt currently has laryngitis, congestion, and a productive cough.  Wife is very concerned about her husband as this cough has been ongoing for almost 6 months.  Patient states he has lost about 40 pounds unintentionally with recent weight gain of 10 pounds, but confirms that he is lost 30 pounds unintentionally.  Wife would like to know if a chest x-ray can be performed to see if he has any fluid in his lungs.  She is also wanting to know about an echocardiogram to see if he has any type of heart failure.  He has had chronic lymphedema with recent worsening swelling to right lower extremity.  Says he was checked for a DVT recently and says test was negative.  Patient denies any seasonal allergies.  He is a former smoker and quit 30 years ago.  He did work at [employer] in [place] and patient says he was not exposed to any chemicals or irritants while at work.  Confirms that his dad had pulmonary fibrosis and worked in the [employer] in [place] for many years. Denies any chest pain, shortness of breath, palpitations, syncope, presyncope, dizziness, orthopnea, PND, acute bleeding, or claudication.  ROS: Negative. See HPI.   Studies Reviewed: Raymond Hurst   EKG Interpretation Date/Time:  Wednesday May 31 2023 15:46:51 EDT Ventricular Rate:  73 PR Interval:    QRS Duration:  96 QT Interval:  390 QTC Calculation: 429 R  Axis:   -29  Text Interpretation: Atrial fibrillation When compared with ECG of 22-Jul-2022 06:08, No significant change was found Confirmed by Raymond Hurst [id number] on 05/31/2023 4:02:29 PM    Echo 09/2019:  1. Left ventricular ejection fraction, by estimation, is 55 to 60%. The  left ventricle has normal function. The left ventricle has no regional  wall motion abnormalities. There is mild left ventricular hypertrophy.  Left ventricular diastolic parameters  are indeterminate.   2. Right ventricular systolic function is normal. The right ventricular  size is normal.   3. The mitral valve is normal in structure. Trivial mitral valve  regurgitation. No evidence of mitral stenosis.   4. The aortic valve has an indeterminant number of cusps. Aortic valve  regurgitation is not visualized. No aortic stenosis is present.   5. Aortic dilatation noted. There is mild to moderate dilatation of the  aortic root measuring 43 mm.   6. Indeterminant PASP, inadequate TR jet.   7. The inferior vena cava is normal in size with greater than 50%  respiratory variability, suggesting right atrial pressure of 3 mmHg.  Physical Exam:   VS:  BP 120/62   Pulse 73   Ht 6' (1.829 m)   Wt (!) 303 lb 12.8 oz (137.8 kg)   SpO2 96%   BMI 41.20 kg/m    Wt Readings from Last 3 Encounters:  05/31/23 Raymond Hurst)  303 lb 12.8 oz (137.8 kg)  01/30/23 (!) 338 lb (153.3 kg)  08/15/22 (!) 338 lb 6.4 oz (153.5 kg)    GEN: Morbidly obese, 73 y.o. male in no acute distress NECK: No JVD; No carotid bruits CARDIAC: S1/S2, irregularly irregular rhythm, no murmurs, rubs, gallops RESPIRATORY:  Diminished and course to auscultation without rhonchi or wheezing, strong/productive cough, raspy voice.  EXTREMITIES:  Lymphedema to BLE, (RLE > LLE); No deformity   ASSESSMENT AND PLAN: .    Permanent A-fib Denies any tachycardia or palpitations. HR is well controlled today. Continue Lopressor  for heart rate control. Continue Eliquis  for  stroke prevention. Denies any bleeding issues. He is on appropriate dosage. Heart healthy diet and regular cardiovascular exercise as tolerated encouraged. Will obtain CBC.   TIA in 2020 Denies any symptoms. He is on Eliquis  for stroke prevention as mentioned above. Heart healthy diet and regular cardiovascular exercise as tolerated encouraged.   Leg edema/lymphedema Worsening leg edema, more localized to RLE > LLE. Recent lower extremity doppler was negative for DVT due to wife's report. He remains on Eliquis . Low suspicion for DVT. Will increase Lasix  to 40 mg daily, as he tells me he is taking Lasix  20 mg daily. Will update Echo and obtain proBNP and CMET.  Chest congestion, persistent cough, abnormal breath sounds, unintentional weight loss,   Chronic cough lasting almost 6 months, unclear etiology. Does not appear medication related. He denies seasonal allergies. Also denies past exposure or recent exposure to past irritants. He is a former smoker and quit about 30 years ago. Denies fever, chills, N/V/D or any flu-like symptoms. Has lost around 30 lbs unintentionally per his report. Does have some diminished/course breath sounds on exam with strong productive cough and evidence of laryngitis. Will obtain 2 view CXR to r/o anything acute and for any PNA.  Will also obtain CBC and ESR.    Dispo: Follow-up with me/[facility] in 4-6 weeks or sooner if anything changes.   Signed, Raymond Pointer, NP

## 2023-06-01 ENCOUNTER — Other Ambulatory Visit: Payer: Self-pay | Admitting: Nurse Practitioner

## 2023-06-01 ENCOUNTER — Telehealth: Payer: Self-pay | Admitting: Nurse Practitioner

## 2023-06-01 ENCOUNTER — Ambulatory Visit
Admission: RE | Admit: 2023-06-01 | Discharge: 2023-06-01 | Disposition: A | Discharge status: Home / Self Care | Source: Ambulatory Visit | Attending: Nurse Practitioner | Admitting: Nurse Practitioner

## 2023-06-01 DIAGNOSIS — R059 Cough, unspecified: Secondary | ICD-10-CM | POA: Diagnosis not present

## 2023-06-01 DIAGNOSIS — R0689 Other abnormalities of breathing: Secondary | ICD-10-CM | POA: Diagnosis not present

## 2023-06-01 DIAGNOSIS — R9389 Abnormal findings on diagnostic imaging of other specified body structures: Secondary | ICD-10-CM

## 2023-06-01 DIAGNOSIS — Z6841 Body Mass Index (BMI) 40.0 and over, adult: Secondary | ICD-10-CM | POA: Diagnosis not present

## 2023-06-01 DIAGNOSIS — I4821 Permanent atrial fibrillation: Secondary | ICD-10-CM | POA: Diagnosis not present

## 2023-06-01 DIAGNOSIS — R0989 Other specified symptoms and signs involving the circulatory and respiratory systems: Secondary | ICD-10-CM

## 2023-06-01 DIAGNOSIS — R918 Other nonspecific abnormal finding of lung field: Secondary | ICD-10-CM | POA: Diagnosis not present

## 2023-06-01 DIAGNOSIS — R600 Localized edema: Secondary | ICD-10-CM | POA: Diagnosis not present

## 2023-06-01 NOTE — Telephone Encounter (Signed)
 Calling with abnormal X-Ray Report

## 2023-06-01 NOTE — Telephone Encounter (Signed)
 Please see my most recent result note.  I would like this to be faxed to his primary care provider with most recent chest x-ray result in my result note so primary care provider is aware of what is going on.  I did call and speak with patient on the phone.  Did discuss that his chest x-ray is abnormal and there is some concern for possible pneumonia.  I discussed that I will be starting an antibiotic for him and sending in this prescription to his pharmacy.  As I was speaking with patient, I briefly spoke with nurse practitioner Gearldean Keepers, NP of [facility] (local Urgent [facility] in [facility]) as patient was there to receive treatment for his symptoms. John stated on the phone that because I was managing this for him currently, he would not charge patient for a visit.   I did tell patient that I am referring him to Dr. Villa Greaser based on his recent symptoms he reported to me.  Patient verbalized understanding.  I have looped in Dr. Villa Greaser so he is aware about this abnormal chest x-ray and that I am referring patient to him.  Once patient has been treated with Augmentin, he will require a CT scan of his chest to confirm if there is any underlying malignancy.  Of course if future testing were to show possible malignancy, this result would need to be gone over in person and not on the phone.   I we will be sending in a prescription for Augmentin 500 mg / 125 mg PO Q8H x 5 days. He needs to follow-up with his PCP in the meantime, and I did discuss when to go to the ED. patient verbalized understanding was in appreciative of my call.  [name], NP

## 2023-06-01 NOTE — Telephone Encounter (Signed)
 Called and gave verbal order to pharmacy tech [facility] Boh[facility]. Read the prescription back to me completely correct and without error for Augmentin 500 mg / 125 mg PO, Take 1 tablet PO Q8H for 5 days.   [name], NP

## 2023-06-02 ENCOUNTER — Telehealth: Payer: Self-pay | Admitting: Cardiology

## 2023-06-02 ENCOUNTER — Telehealth: Payer: Self-pay | Admitting: Nurse Practitioner

## 2023-06-02 ENCOUNTER — Other Ambulatory Visit: Payer: Self-pay | Admitting: Nurse Practitioner

## 2023-06-02 DIAGNOSIS — R600 Localized edema: Secondary | ICD-10-CM

## 2023-06-02 DIAGNOSIS — R9389 Abnormal findings on diagnostic imaging of other specified body structures: Secondary | ICD-10-CM

## 2023-06-02 LAB — CBC WITH DIFFERENTIAL/PLATELET
Basophils Absolute: 0 10*3/uL (ref 0.0–0.2)
Basos: 1 %
EOS (ABSOLUTE): 0.2 10*3/uL (ref 0.0–0.4)
Eos: 3 %
Hematocrit: 36.9 % — ABNORMAL LOW (ref 37.5–51.0)
Hemoglobin: 11.4 g/dL — ABNORMAL LOW (ref 13.0–17.7)
Immature Grans (Abs): 0 10*3/uL (ref 0.0–0.1)
Immature Granulocytes: 0 %
Lymphocytes Absolute: 1 10*3/uL (ref 0.7–3.1)
Lymphs: 15 %
MCH: 26 pg — ABNORMAL LOW (ref 26.6–33.0)
MCHC: 30.9 g/dL — ABNORMAL LOW (ref 31.5–35.7)
MCV: 84 fL (ref 79–97)
Monocytes Absolute: 0.6 10*3/uL (ref 0.1–0.9)
Monocytes: 8 %
Neutrophils Absolute: 5 10*3/uL (ref 1.4–7.0)
Neutrophils: 73 %
Platelets: 231 10*3/uL (ref 150–450)
RBC: 4.39 x10E6/uL (ref 4.14–5.80)
RDW: 16.8 % — ABNORMAL HIGH (ref 11.6–15.4)
WBC: 6.9 10*3/uL (ref 3.4–10.8)

## 2023-06-02 LAB — COMPREHENSIVE METABOLIC PANEL WITH GFR
ALT: 9 IU/L (ref 0–44)
AST: 12 IU/L (ref 0–40)
Albumin: 3 g/dL — ABNORMAL LOW (ref 3.8–4.8)
Alkaline Phosphatase: 73 IU/L (ref 44–121)
BUN/Creatinine Ratio: 12 (ref 10–24)
BUN: 9 mg/dL (ref 8–27)
Bilirubin Total: 0.5 mg/dL (ref 0.0–1.2)
CO2: 23 mmol/L (ref 20–29)
Calcium: 8.4 mg/dL — ABNORMAL LOW (ref 8.6–10.2)
Chloride: 102 mmol/L (ref 96–106)
Creatinine, Ser: 0.73 mg/dL — ABNORMAL LOW (ref 0.76–1.27)
Globulin, Total: 3.2 g/dL (ref 1.5–4.5)
Glucose: 85 mg/dL (ref 70–99)
Potassium: 4.2 mmol/L (ref 3.5–5.2)
Sodium: 137 mmol/L (ref 134–144)
Total Protein: 6.2 g/dL (ref 6.0–8.5)
eGFR: 97 mL/min/{1.73_m2} (ref >59)

## 2023-06-02 LAB — BRAIN NATRIURETIC PEPTIDE: BNP: 306.2 pg/mL — ABNORMAL HIGH (ref 0.0–100.0)

## 2023-06-02 LAB — SEDIMENTATION RATE: Sed Rate: 43 mm/h — ABNORMAL HIGH (ref 0–30)

## 2023-06-02 NOTE — Addendum Note (Signed)
 Addended by: Perri Lamagna on: 06/02/2023 04:16 PM   Modules accepted: Orders

## 2023-06-02 NOTE — Telephone Encounter (Signed)
 Called and s/w patient and wife (okay per DPR) and they both confirmed patient is taking Lasix  40 mg daily now. There was some confusion on dosage of Lasix  pt was taking prior to office visit on 4/30, stated was previously taking 20 mg daily. Went over recent lab results. Instructed to continue Lasix  40 mg daily at this time. Will see what next lab work [employer]er[employer]) shows.  Updated them regarding repeat BMET after Lasix  increase for next Wednesday, 5/7. Order for CT scan of chest with contrast has been placed and appt scheduled to see Dr. Villa Greaser on 5/15 has been confirmed.   Patient and wife verbalized understanding about instructions and conversation on the phone and were appreciative of my call.   [name], NP

## 2023-06-02 NOTE — Progress Notes (Signed)
 Patient has been scheduled with DR Darnelle Elders  on 5/7 @ 8:45am in [place].

## 2023-06-02 NOTE — Telephone Encounter (Signed)
 Checking percert on the following patient for testing scheduled at [facility].     CT CHEST WITH CONTRAST   06/05/2023

## 2023-06-02 NOTE — Telephone Encounter (Signed)
 Patient's wife calling with questions about patient being referred to the pulmonary dr. Please advise

## 2023-06-05 ENCOUNTER — Other Ambulatory Visit: Payer: Self-pay

## 2023-06-05 ENCOUNTER — Encounter (HOSPITAL_COMMUNITY): Payer: Self-pay | Admitting: Emergency Medicine

## 2023-06-05 ENCOUNTER — Emergency Department (HOSPITAL_COMMUNITY)

## 2023-06-05 ENCOUNTER — Ambulatory Visit (HOSPITAL_COMMUNITY): Admission: RE | Admit: 2023-06-05 | Source: Ambulatory Visit

## 2023-06-05 ENCOUNTER — Observation Stay (HOSPITAL_COMMUNITY)
Admission: EM | Admit: 2023-06-05 | Discharge: 2023-06-07 | Disposition: A | Discharge status: Home / Self Care | Attending: Internal Medicine | Admitting: Internal Medicine

## 2023-06-05 DIAGNOSIS — R0600 Dyspnea, unspecified: Secondary | ICD-10-CM

## 2023-06-05 DIAGNOSIS — Z79899 Other long term (current) drug therapy: Secondary | ICD-10-CM | POA: Insufficient documentation

## 2023-06-05 DIAGNOSIS — J3800 Paralysis of vocal cords and larynx, unspecified: Secondary | ICD-10-CM | POA: Diagnosis not present

## 2023-06-05 DIAGNOSIS — I480 Paroxysmal atrial fibrillation: Secondary | ICD-10-CM | POA: Diagnosis present

## 2023-06-05 DIAGNOSIS — Z8673 Personal history of transient ischemic attack (TIA), and cerebral infarction without residual deficits: Secondary | ICD-10-CM | POA: Diagnosis not present

## 2023-06-05 DIAGNOSIS — Z87891 Personal history of nicotine dependence: Secondary | ICD-10-CM | POA: Insufficient documentation

## 2023-06-05 DIAGNOSIS — R079 Chest pain, unspecified: Secondary | ICD-10-CM | POA: Diagnosis not present

## 2023-06-05 DIAGNOSIS — R918 Other nonspecific abnormal finding of lung field: Secondary | ICD-10-CM | POA: Diagnosis not present

## 2023-06-05 DIAGNOSIS — R490 Dysphonia: Secondary | ICD-10-CM | POA: Diagnosis not present

## 2023-06-05 DIAGNOSIS — Z7901 Long term (current) use of anticoagulants: Secondary | ICD-10-CM | POA: Diagnosis not present

## 2023-06-05 DIAGNOSIS — Z6841 Body Mass Index (BMI) 40.0 and over, adult: Secondary | ICD-10-CM | POA: Diagnosis not present

## 2023-06-05 DIAGNOSIS — E669 Obesity, unspecified: Secondary | ICD-10-CM | POA: Diagnosis not present

## 2023-06-05 DIAGNOSIS — I4891 Unspecified atrial fibrillation: Secondary | ICD-10-CM | POA: Diagnosis not present

## 2023-06-05 DIAGNOSIS — R0602 Shortness of breath: Secondary | ICD-10-CM | POA: Diagnosis present

## 2023-06-05 DIAGNOSIS — I890 Lymphedema, not elsewhere classified: Secondary | ICD-10-CM | POA: Diagnosis not present

## 2023-06-05 DIAGNOSIS — R059 Cough, unspecified: Secondary | ICD-10-CM

## 2023-06-05 DIAGNOSIS — J9811 Atelectasis: Secondary | ICD-10-CM | POA: Diagnosis not present

## 2023-06-05 DIAGNOSIS — I2699 Other pulmonary embolism without acute cor pulmonale: Secondary | ICD-10-CM | POA: Diagnosis not present

## 2023-06-05 DIAGNOSIS — J984 Other disorders of lung: Secondary | ICD-10-CM | POA: Diagnosis not present

## 2023-06-05 DIAGNOSIS — J90 Pleural effusion, not elsewhere classified: Secondary | ICD-10-CM | POA: Diagnosis not present

## 2023-06-05 LAB — TROPONIN I (HIGH SENSITIVITY)
Troponin I (High Sensitivity): 7 ng/L (ref <18)
Troponin I (High Sensitivity): 5 ng/L (ref <18)

## 2023-06-05 LAB — CBC
HCT: 39.9 % (ref 39.0–52.0)
Hemoglobin: 12.2 g/dL — ABNORMAL LOW (ref 13.0–17.0)
MCH: 25.7 pg — ABNORMAL LOW (ref 26.0–34.0)
MCHC: 30.6 g/dL (ref 30.0–36.0)
MCV: 84 fL (ref 80.0–100.0)
Platelets: 251 10*3/uL (ref 150–400)
RBC: 4.75 MIL/uL (ref 4.22–5.81)
RDW: 18.2 % — ABNORMAL HIGH (ref 11.5–15.5)
WBC: 6.9 10*3/uL (ref 4.0–10.5)
nRBC: 0 % (ref 0.0–0.2)

## 2023-06-05 LAB — COMPREHENSIVE METABOLIC PANEL WITH GFR
ALT: 9 U/L (ref 0–44)
AST: 17 U/L (ref 15–41)
Albumin: 2.7 g/dL — ABNORMAL LOW (ref 3.5–5.0)
Alkaline Phosphatase: 54 U/L (ref 38–126)
Anion gap: 10 (ref 5–15)
BUN: 6 mg/dL — ABNORMAL LOW (ref 8–23)
CO2: 25 mmol/L (ref 22–32)
Calcium: 8.8 mg/dL — ABNORMAL LOW (ref 8.9–10.3)
Chloride: 101 mmol/L (ref 98–111)
Creatinine, Ser: 0.8 mg/dL (ref 0.61–1.24)
GFR, Estimated: >60 mL/min (ref >60)
Glucose, Bld: 136 mg/dL — ABNORMAL HIGH (ref 70–99)
Potassium: 3.7 mmol/L (ref 3.5–5.1)
Sodium: 136 mmol/L (ref 135–145)
Total Bilirubin: 0.7 mg/dL (ref 0.0–1.2)
Total Protein: 6.5 g/dL (ref 6.5–8.1)

## 2023-06-05 LAB — BRAIN NATRIURETIC PEPTIDE: B Natriuretic Peptide: 166.2 pg/mL — ABNORMAL HIGH (ref 0.0–100.0)

## 2023-06-05 MED ORDER — ALLOPURINOL 100 MG PO TABS
100.0000 mg | ORAL_TABLET | Freq: Every day | ORAL | Status: DC
  Administered 2023-06-05 – 2023-06-07 (×3): 100 mg via ORAL
  Filled 2023-06-05 (×3): qty 1

## 2023-06-05 MED ORDER — IPRATROPIUM-ALBUTEROL 0.5-2.5 (3) MG/3ML IN SOLN
3.0000 mL | Freq: Once | RESPIRATORY_TRACT | Status: AC
  Administered 2023-06-05: 3 mL via RESPIRATORY_TRACT
  Filled 2023-06-05: qty 3

## 2023-06-05 MED ORDER — ACETAMINOPHEN 650 MG RE SUPP: 650.0000 mg | Freq: Four times a day (QID) | RECTAL | Status: DC | PRN

## 2023-06-05 MED ORDER — SODIUM CHLORIDE 0.9% FLUSH
3.0000 mL | Freq: Two times a day (BID) | INTRAVENOUS | Status: DC
  Administered 2023-06-05 – 2023-06-07 (×4): 3 mL via INTRAVENOUS

## 2023-06-05 MED ORDER — FUROSEMIDE 40 MG PO TABS
40.0000 mg | ORAL_TABLET | Freq: Every day | ORAL | Status: DC
  Administered 2023-06-05 – 2023-06-07 (×3): 40 mg via ORAL
  Filled 2023-06-05 (×3): qty 1

## 2023-06-05 MED ORDER — ACETAMINOPHEN 325 MG PO TABS
650.0000 mg | ORAL_TABLET | Freq: Four times a day (QID) | ORAL | Status: DC | PRN
  Administered 2023-06-05 – 2023-06-06 (×2): 650 mg via ORAL
  Filled 2023-06-05 (×2): qty 2

## 2023-06-05 MED ORDER — ENOXAPARIN SODIUM 60 MG/0.6ML IJ SOSY: 60.0000 mg | PREFILLED_SYRINGE | INTRAMUSCULAR | Status: DC

## 2023-06-05 MED ORDER — METOPROLOL TARTRATE 25 MG PO TABS
25.0000 mg | ORAL_TABLET | Freq: Two times a day (BID) | ORAL | Status: DC
  Administered 2023-06-05 – 2023-06-07 (×4): 25 mg via ORAL
  Filled 2023-06-05 (×4): qty 1

## 2023-06-05 MED ORDER — IOHEXOL 350 MG/ML SOLN
75.0000 mL | Freq: Once | INTRAVENOUS | Status: AC | PRN
  Administered 2023-06-05: 75 mL via INTRAVENOUS

## 2023-06-05 MED ORDER — POLYETHYLENE GLYCOL 3350 17 G PO PACK: 17.0000 g | PACK | Freq: Every day | ORAL | Status: DC | PRN

## 2023-06-05 NOTE — ED Provider Notes (Signed)
 [place] [facility]GENCY DEPARTMENT AT [facility] Provider Note   CSN: [id number] Arrival date & time: 06/05/23  1253     History  Chief Complaint  Patient presents with   Shortness of Breath    Raymond Hurst is a 73 y.o. male history of A-fib on Eliquis , left lung mass, alcohol use disorder, TIA, right adrenal mass presented for shortness of breath along with hoarse voice has been present since December however has gotten acutely worse over the past day.  Wife is concerned that he has heart failure as he gets short of breath with exertion but also gets short of breath and speaking.  Patient has lymphedema at baseline however wife states both of his legs are getting more more swollen.  Patient denies any chest pain but states he feels persistently short of breath.  Patient denies any fevers.  Home Medications Prior to Admission medications   Medication Sig Start Date End Date Taking? Authorizing Provider  acetaminophen  (TYLENOL ) 500 MG tablet Take 500 mg by mouth every 6 (six) hours as needed for mild pain.    [provider]  allopurinol (ZYLOPRIM) 100 MG tablet Take 100 mg by mouth daily. 05/25/22   [provider]  apixaban  (ELIQUIS ) 5 MG TABS tablet Take 1 tablet (5 mg total) by mouth 2 (two) times daily. 03/16/23   Jacqueline Matsu, MD  Cholecalciferol  (VITAMIN D ) 125 MCG (5000 UT) CAPS Take 5,000 Units by mouth daily.    [provider]  Cyanocobalamin  (VITAMIN B 12 PO) Take 1 tablet by mouth daily.    [provider]  furosemide  (LASIX ) 40 MG tablet Take 1 tablet (40 mg total) by mouth daily. 05/31/23   [name], NP  metoprolol  tartrate (LOPRESSOR ) 25 MG tablet Take 1 tablet (25 mg total) by mouth 2 (two) times daily. 11/17/21   Dorma Gash, PA-C      Allergies    Patient has no known allergies.    Review of Systems   Review of Systems  Respiratory:  Positive for shortness of breath.     Physical Exam Updated Vital  Signs BP 112/60   Pulse 78   Temp 98.1 F (36.7 C)   Resp 19   Ht 6' (1.829 m)   Wt (!) 137.8 kg   SpO2 100%   BMI 41.20 kg/m  Physical Exam Constitutional:      General: He is not in acute distress. Cardiovascular:     Rate and Rhythm: Normal rate and regular rhythm.     Pulses: Normal pulses.     Heart sounds: Normal heart sounds.  Pulmonary:     Effort: No respiratory distress.     Breath sounds: Normal breath sounds.     Comments: Able to speak in full sentences Increased work of breathing Hoarse voice Musculoskeletal:     Comments: Lymphedema present bilaterally  Skin:    General: Skin is warm and dry.  Neurological:     Mental Status: He is alert.  Psychiatric:        Mood and Affect: Mood normal.     ED Results / Procedures / Treatments   Labs (all labs ordered are listed, but only abnormal results are displayed) Labs Reviewed  CBC - Abnormal; Notable for the following components:      Result Value   Hemoglobin 12.2 (*)    MCH 25.7 (*)    RDW 18.2 (*)    All other components within normal limits  COMPREHENSIVE  METABOLIC PANEL WITH GFR - Abnormal; Notable for the following components:   Glucose, Bld 136 (*)    BUN 6 (*)    Calcium  8.8 (*)    Albumin 2.7 (*)    All other components within normal limits  BRAIN NATRIURETIC PEPTIDE - Abnormal; Notable for the following components:   B Natriuretic Peptide 166.2 (*)    All other components within normal limits  TROPONIN I (HIGH SENSITIVITY)  TROPONIN I (HIGH SENSITIVITY)    EKG EKG Interpretation Date/Time:  Monday Jun 05 2023 14:34:42 EDT Ventricular Rate:  83 PR Interval:    QRS Duration:  100 QT Interval:  414 QTC Calculation: 487 R Axis:   -35  Text Interpretation: Atrial fibrillation Left axis deviation Borderline prolonged QT interval Confirmed by Shyrl Doyne [id number]) on 06/05/2023 3:05:34 PM  Radiology CT Angio Chest PE W/Cm &/Or Wo Cm Result Date: 06/05/2023 CLINICAL DATA:  Shortness of  breath.  Left upper lobe mass. * Tracking Code: BO * EXAM: CT ANGIOGRAPHY CHEST WITH CONTRAST TECHNIQUE: Multidetector CT imaging of the chest was performed using the standard protocol during bolus administration of intravenous contrast. Multiplanar CT image reconstructions and MIPs were obtained to evaluate the vascular anatomy. RADIATION DOSE REDUCTION: This exam was performed according to the departmental dose-optimization program which includes automated exposure control, adjustment of the mA and/or kV according to patient size and/or use of iterative reconstruction technique. CONTRAST:  75mL OMNIPAQUE  IOHEXOL  350 MG/ML SOLN COMPARISON:  Chest radiograph 06/01/2023 FINDINGS: Despite efforts by the technologist and patient, motion artifact is present on today's exam and could not be eliminated. This reduces exam sensitivity and specificity. Cardiovascular: Left upper lobe branches of the left pulmonary artery are effaced or occluded due to a 10.9 by 5.6 by 7.2 cm left upper lobe mass which invades the aorta pulmonary window and mediastinum. This also causes narrowing of the left lower lobe pulmonary artery and pulmonary artery invasion by tumor is not completely excluded. Mass abuts the left lateral pleural margin and obviously extends across the mediastinal pleural margin. There is a moderate left pleural effusion malignant effusion is a distinct possibility. Separate from this mass effect causing narrowing and occlusion of portions of the left upper lobe pulmonary artery, I do not see a separate pulmonary embolus. Lower lobe pulmonary arteries are somewhat obscured by motion artifact. Coronary, aortic arch, and branch vessel atherosclerotic vascular disease. Moderate cardiomegaly. Mediastinum/Nodes: 1.1 cm right paratracheal lymph node on image 41 series 5. Additional clustered right lower paratracheal lymph nodes are present. The dominant left upper lobe mass invades the mediastinum in the vicinity of the  hilum and AP window. Along the posterior mediastinum, there are hypodense nodules in the paraspinal location especially along the right, query extramedullary hematopoiesis. Lungs/Pleura: Occlusion of the left superior lobar bronchus by the large left upper lobe mass. The lingular bronchus is narrowed but not occluded. Patchy ground-glass opacities in the remainder of the left upper lobe and left lower lobe with passive atelectasis of much of the left lower lobe. Hazy ground-glass density in the right lung is present as well, raising the possibility of pulmonary edema. Upper Abdomen: 7.1 by 4.7 cm right adrenal mass with some fatty elements favoring myelolipoma. Musculoskeletal: Thoracic spondylosis. Lucent lesion along the right scapular neck is probably benign. Review of the MIP images confirms the above findings. IMPRESSION: 1. 10.9 by 5.6 by 7.2 cm left upper lobe mass which invades the mediastinum in the vicinity of the hilum and AP window. This  causes effacement or occlusion of portions of the left upper lobe pulmonary artery and narrowing of the left lower lobe pulmonary artery. Pulmonary artery invasion by tumor is not completely excluded. The mass is compatible with bronchogenic carcinoma. Pulmonology and oncology consultations may be indicated for tissue sampling further workup. 2. Moderate left pleural effusion, malignant effusion is a distinct possibility. 3. No obvious filling defect in the visible pulmonary arterial tree to indicate typical pulmonary embolus, with the understanding that part of the left upper lobe pulmonary arterial tree is occluded by the mass, and portions of the lower lobe pulmonary arterial tree bilaterally are obscured by motion artifact. 4. Patchy ground-glass opacities in the remainder of the left upper lobe and left lower lobe with passive atelectasis of much of the left lower lobe. Hazy ground-glass density in the right lung is present as well, raising the possibility of  pulmonary edema. 5. Moderate cardiomegaly. 6. Coronary, aortic arch, and branch vessel atherosclerotic vascular disease. Aortic Atherosclerosis (ICD10-I70.0). 7. 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 8. Lucent lesion along the right scapular neck is probably benign. Electronically Signed   By: Freida Jes M.D.   On: 06/05/2023 17:44    Procedures .Critical Care  Performed by: Denese Finn, PA-C Authorized by: Denese Finn, PA-C   Critical care provider statement:    Critical care time (minutes):  30   Critical care time was exclusive of:  Separately billable procedures and treating other patients   Critical care was necessary to treat or prevent imminent or life-threatening deterioration of the following conditions: Lung mass pressing on pulmonary arteries.   Critical care was time spent personally by me on the following activities:  Blood draw for specimens, development of treatment plan with patient or surrogate, discussions with consultants, evaluation of patient's response to treatment, examination of patient, obtaining history from patient or surrogate, review of old charts, re-evaluation of patient's condition, pulse oximetry, ordering and review of radiographic studies, ordering and review of laboratory studies and ordering and performing treatments and interventions   I assumed direction of critical care for this patient from another provider in my specialty: no     Care discussed with: admitting provider       Medications Ordered in ED Medications  ipratropium-albuterol (DUONEB) 0.5-2.5 (3) MG/3ML nebulizer solution 3 mL (3 mLs Nebulization Given 06/05/23 1420)  ipratropium-albuterol (DUONEB) 0.5-2.5 (3) MG/3ML nebulizer solution 3 mL (3 mLs Nebulization Given 06/05/23 1755)  iohexol  (OMNIPAQUE ) 350 MG/ML injection 75 mL (75 mLs Intravenous Contrast Given 06/05/23 1708)    ED Course/ Medical Decision Making/ A&P                                  Medical Decision Making Amount and/or Complexity of Data Reviewed Labs: ordered. Radiology: ordered.  Risk Prescription drug management. Decision regarding hospitalization.   Deberah Falconer 73 y.o. presented today for shortness of breath.  Working DDx that I considered at this time includes, but not limited to, asthma/COPD exacerbation, URI, viral illness, anemia, ACS, PE, pneumonia, pleural effusion, lung cancer, CHF, respiratory distress, medication side effect, intoxication.  R/o DDx: asthma/COPD exacerbation, URI, viral illness, anemia, ACS, PE, pneumonia, CHF, respiratory distress, medication side effect, intoxication: These are considered less likely due to history of present illness, physical exam, labs/imaging findings  Review of prior external notes: 05/29/23 unknown  Unique Tests and My Independent  Interpretation:  CBC: Unremarkable CMP: Unremarkable EKG: A-fib 83 bpm, no signs of ischemia or right heart strain Troponin: 7, 5 BNP: 166.2 CTA Chest PE: Large lung mass noted in the left upper lobe that is pressing onto pulmonary arteries along with malignant effusion noted  Social Determinants of Health: none  Discussion with Independent Historian:  Wife  Discussion of Management of Tests:  PCCM; Cleda Curly, MD Hospitalist; Maria Shiner, MD Oncology  Risk: High: hospitalization or escalation of hospital-level care  Risk Stratification Score: None  Plan: On exam patient was no acute distress with stable vitals. Physical exam showed lymphedema bilaterally and a hoarse voice however lungs are clear to auscultation bilaterally and the rest of his exam was ultimately reassuring. The cardiac monitor was ordered secondary to the patient's history of shortness of breath and to monitor the patient for dysrhythmia. Cardiac monitor by my independent interpretation showed A-fib.  Patient does a history of A-fib and states he has been compliant with his Eliquis  however given his  persistent shortness of breath with suspected lung mass do feel PE will need to be ruled out so CTA was ordered over a CT chest with contrast.  Labs to be drawn and breathing treatment ordered.  CT scan does show a large left lung mass that does seem to press on to pulmonary arteries.  I spoke with pulmonology and they stated admit the patient to medicine they will be available for consult.  Will consult hospitalist.  I spoke to the hospitalist and patient was accepted for admission.  I also spoke with the oncologist and he states that patient will be evaluated by oncology as well.  Patient stable to be admitted.  This chart was dictated using voice recognition software.  Despite best efforts to proofread,  errors can occur which can change the documentation meaning.         Final Clinical Impression(s) / ED Diagnoses Final diagnoses:  Mass of left lung  Pleural effusion    Rx / DC Orders ED Discharge Orders     None         Jadrien, Reynaga 06/05/23 1849    Ninetta Basket, MD 06/06/23 2039

## 2023-06-05 NOTE — Assessment & Plan Note (Signed)
 Apparently this has been previously known.  Chest x-ray on file from May 1 does show left upper lobe masslike consolidation.  Certainly patient describes worsening symptoms as I mentioned in the HPI.  At this time consult has been placed with PCCM servcie by ER provider. Also Dr. Maria Shiner has been consulted. Will follow up there input.  Certainly the next step is tissue sampling and I think pulmonary service could have the first go given that mass is invading the mediastinum.

## 2023-06-05 NOTE — H&P (Addendum)
 History and Physical    Patient: Raymond Hurst ZOX:[id number] DOB: 01-28-1951 DOA: 06/05/2023 DOS: the patient was seen and examined on 06/05/2023 PCP: Gwyndolyn Lerner, PA-C  Patient coming from: Home  Chief Complaint:  Chief Complaint  Patient presents with   Shortness of Breath   HPI: Raymond Hurst is a 73 y.o. male with medical history significant of atrial fibrillation for which he is on chronic anticoagulation as well as lymphedema for which he takes Lasix  at home.  Patient seems to have been in his usual state of health till around December, 5 months ago when he started having a new dry cough.  Slowly progressive hoarseness of voice.  Especially notable in the last 5 to 7 days.  Patient alternatively states to me that he has some shortness of breath versus not.  Certainly denies any chest pain.  Patient has chronic lower extremity swelling due to lymphedema denies any change in that.  Patient advises that he came to the ER today because of persistence of his cough worsening hoarseness and encouragement from his wife.  Patient denies any fever or palpitation loss of consciousness.  Workup in the ER as noted below please see CAT scan finding for lung mass.  Troponin is negative.  Patient is without distress. Review of Systems: As mentioned in the history of present illness. All other systems reviewed and are negative. Past Medical History:  Diagnosis Date   Atrial fibrillation (HCC)    Cellulitis of right lower leg    History of TIA (transient ischemic attack)    May 2020   Lymphedema    OSA on CPAP    severe obstructive sleep apnea with an AHI of 54/h with no significant central events.  He had nocturnal hypoxemia with O2 sat nadir of 70%. Now on CPAP   Past Surgical History:  Procedure Laterality Date   INNER EAR SURGERY     ORTHOPEDIC SURGERY     Social History:  reports that he quit smoking about 33 years ago. His smoking use included cigarettes. He started smoking about  53 years ago. He has a 40 pack-year smoking history. He has never used smokeless tobacco. He reports current alcohol use. He reports that he does not use drugs.  No Known Allergies  Family History  Problem Relation Age of Onset   Heart failure Mother    COPD Mother    Atrial fibrillation Mother    Idiopathic pulmonary fibrosis Father     Prior to Admission medications   Medication Sig Start Date End Date Taking? Authorizing Provider  acetaminophen  (TYLENOL ) 500 MG tablet Take 500 mg by mouth every 6 (six) hours as needed for mild pain.    [provider]  allopurinol (ZYLOPRIM) 100 MG tablet Take 100 mg by mouth daily. 05/25/22   [provider]  apixaban  (ELIQUIS ) 5 MG TABS tablet Take 1 tablet (5 mg total) by mouth 2 (two) times daily. 03/16/23   Jacqueline Matsu, MD  Cholecalciferol  (VITAMIN D ) 125 MCG (5000 UT) CAPS Take 5,000 Units by mouth daily.    [provider]  Cyanocobalamin  (VITAMIN B 12 PO) Take 1 tablet by mouth daily.    [provider]  furosemide  (LASIX ) 40 MG tablet Take 1 tablet (40 mg total) by mouth daily. 05/31/23   [name], NP  metoprolol  tartrate (LOPRESSOR ) 25 MG tablet Take 1 tablet (25 mg total) by mouth 2 (two) times daily. 11/17/21   Dorma Gash, PA-C  Physical Exam: Vitals:   06/05/23 1530 06/05/23 1600 06/05/23 1615 06/05/23 1924  BP:  124/67 112/60   Pulse:  85 78 82  Resp:  (!) 25 19   Temp:    97.9 F (36.6 C)  SpO2:  100% 100% 97%  Weight: (!) 137.8 kg     Height: 6' (1.829 m)      General: Obese appearing gentleman in no distress on room air. Respiratory exam: Bilateral air entry vesicular Cardiovascular exam S1 is normal Abdomen soft nontender Extremities bilateral 2+ edema of extremities which is not pitting.  With pressure stocking in situ.  Distal function intact.  No supraclavicular lymphadenopathy. Data Reviewed:  Labs on Admission:  Results for orders placed or performed during the  hospital encounter of 06/05/23 (from the past 24 hours)  CBC     Status: Abnormal   Collection Time: 06/05/23  1:30 PM  Result Value Ref Range   WBC 6.9 4.0 - 10.5 K/uL   RBC 4.75 4.22 - 5.81 MIL/uL   Hemoglobin 12.2 (L) 13.0 - 17.0 g/dL   HCT 53.6 64.4 - 03.4 %   MCV 84.0 80.0 - 100.0 fL   MCH 25.7 (L) 26.0 - 34.0 pg   MCHC 30.6 30.0 - 36.0 g/dL   RDW 74.2 (H) 59.5 - 63.8 %   Platelets 251 150 - 400 K/uL   nRBC 0.0 0.0 - 0.2 %  Comprehensive metabolic panel     Status: Abnormal   Collection Time: 06/05/23  1:30 PM  Result Value Ref Range   Sodium 136 135 - 145 mmol/L   Potassium 3.7 3.5 - 5.1 mmol/L   Chloride 101 98 - 111 mmol/L   CO2 25 22 - 32 mmol/L   Glucose, Bld 136 (H) 70 - 99 mg/dL   BUN 6 (L) 8 - 23 mg/dL   Creatinine, Ser 7.56 0.61 - 1.24 mg/dL   Calcium  8.8 (L) 8.9 - 10.3 mg/dL   Total Protein 6.5 6.5 - 8.1 g/dL   Albumin 2.7 (L) 3.5 - 5.0 g/dL   AST 17 15 - 41 U/L   ALT 9 0 - 44 U/L   Alkaline Phosphatase 54 38 - 126 U/L   Total Bilirubin 0.7 0.0 - 1.2 mg/dL   GFR, Estimated >43 >32 mL/min   Anion gap 10 5 - 15  Troponin I (High Sensitivity)     Status: None   Collection Time: 06/05/23  1:30 PM  Result Value Ref Range   Troponin I (High Sensitivity) 7 <18 ng/L  Brain natriuretic peptide     Status: Abnormal   Collection Time: 06/05/23  1:30 PM  Result Value Ref Range   B Natriuretic Peptide 166.2 (H) 0.0 - 100.0 pg/mL  Troponin I (High Sensitivity)     Status: None   Collection Time: 06/05/23  3:35 PM  Result Value Ref Range   Troponin I (High Sensitivity) 5 <18 ng/L   Basic Metabolic Panel: Recent Labs  Lab 06/01/23 1439 06/05/23 1330  NA 137 136  K 4.2 3.7  CL 102 101  CO2 23 25  GLUCOSE 85 136*  BUN 9 6*  CREATININE 0.73* 0.80  CALCIUM  8.4* 8.8*   Liver Function Tests: Recent Labs  Lab 06/01/23 1439 06/05/23 1330  AST 12 17  ALT 9 9  ALKPHOS 73 54  BILITOT 0.5 0.7  PROT 6.2 6.5  ALBUMIN 3.0* 2.7*   No results for input(s):  "LIPASE", "AMYLASE" in the last 168 hours. No results for  input(s): "AMMONIA" in the last 168 hours. CBC: Recent Labs  Lab 06/01/23 1439 06/05/23 1330  WBC 6.9 6.9  NEUTROABS 5.0  --   HGB 11.4* 12.2*  HCT 36.9* 39.9  MCV 84 84.0  PLT 231 251   Cardiac Enzymes: Recent Labs  Lab 06/05/23 1330 06/05/23 1535  TROPONINIHS 7 5    BNP (last 3 results) No results for input(s): "PROBNP" in the last 8760 hours. CBG: No results for input(s): "GLUCAP" in the last 168 hours.  Radiological Exams on Admission:  CT Angio Chest PE W/Cm &/Or Wo Cm Result Date: 06/05/2023 CLINICAL DATA:  Shortness of breath.  Left upper lobe mass. * Tracking Code: BO * EXAM: CT ANGIOGRAPHY CHEST WITH CONTRAST TECHNIQUE: Multidetector CT imaging of the chest was performed using the standard protocol during bolus administration of intravenous contrast. Multiplanar CT image reconstructions and MIPs were obtained to evaluate the vascular anatomy. RADIATION DOSE REDUCTION: This exam was performed according to the departmental dose-optimization program which includes automated exposure control, adjustment of the mA and/or kV according to patient size and/or use of iterative reconstruction technique. CONTRAST:  75mL OMNIPAQUE  IOHEXOL  350 MG/ML SOLN COMPARISON:  Chest radiograph 06/01/2023 FINDINGS: Despite efforts by the technologist and patient, motion artifact is present on today's exam and could not be eliminated. This reduces exam sensitivity and specificity. Cardiovascular: Left upper lobe branches of the left pulmonary artery are effaced or occluded due to a 10.9 by 5.6 by 7.2 cm left upper lobe mass which invades the aorta pulmonary window and mediastinum. This also causes narrowing of the left lower lobe pulmonary artery and pulmonary artery invasion by tumor is not completely excluded. Mass abuts the left lateral pleural margin and obviously extends across the mediastinal pleural margin. There is a moderate left pleural  effusion malignant effusion is a distinct possibility. Separate from this mass effect causing narrowing and occlusion of portions of the left upper lobe pulmonary artery, I do not see a separate pulmonary embolus. Lower lobe pulmonary arteries are somewhat obscured by motion artifact. Coronary, aortic arch, and branch vessel atherosclerotic vascular disease. Moderate cardiomegaly. Mediastinum/Nodes: 1.1 cm right paratracheal lymph node on image 41 series 5. Additional clustered right lower paratracheal lymph nodes are present. The dominant left upper lobe mass invades the mediastinum in the vicinity of the hilum and AP window. Along the posterior mediastinum, there are hypodense nodules in the paraspinal location especially along the right, query extramedullary hematopoiesis. Lungs/Pleura: Occlusion of the left superior lobar bronchus by the large left upper lobe mass. The lingular bronchus is narrowed but not occluded. Patchy ground-glass opacities in the remainder of the left upper lobe and left lower lobe with passive atelectasis of much of the left lower lobe. Hazy ground-glass density in the right lung is present as well, raising the possibility of pulmonary edema. Upper Abdomen: 7.1 by 4.7 cm right adrenal mass with some fatty elements favoring myelolipoma. Musculoskeletal: Thoracic spondylosis. Lucent lesion along the right scapular neck is probably benign. Review of the MIP images confirms the above findings. IMPRESSION: 1. 10.9 by 5.6 by 7.2 cm left upper lobe mass which invades the mediastinum in the vicinity of the hilum and AP window. This causes effacement or occlusion of portions of the left upper lobe pulmonary artery and narrowing of the left lower lobe pulmonary artery. Pulmonary artery invasion by tumor is not completely excluded. The mass is compatible with bronchogenic carcinoma. Pulmonology and oncology consultations may be indicated for tissue sampling further workup. 2. Moderate  left pleural  effusion, malignant effusion is a distinct possibility. 3. No obvious filling defect in the visible pulmonary arterial tree to indicate typical pulmonary embolus, with the understanding that part of the left upper lobe pulmonary arterial tree is occluded by the mass, and portions of the lower lobe pulmonary arterial tree bilaterally are obscured by motion artifact. 4. Patchy ground-glass opacities in the remainder of the left upper lobe and left lower lobe with passive atelectasis of much of the left lower lobe. Hazy ground-glass density in the right lung is present as well, raising the possibility of pulmonary edema. 5. Moderate cardiomegaly. 6. Coronary, aortic arch, and branch vessel atherosclerotic vascular disease. Aortic Atherosclerosis (ICD10-I70.0). 7. 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 8. Lucent lesion along the right scapular neck is probably benign. Electronically Signed   By: Freida Jes M.D.   On: 06/05/2023 17:44    chest X-ray    No intake/output data recorded. No intake/output data recorded.       Assessment and Plan: * Lung mass Apparently this has been previously known.  Chest x-ray on file from May 1 does show left upper lobe masslike consolidation.  Certainly patient describes worsening symptoms as I mentioned in the HPI.  At this time consult has been placed with PCCM servcie by ER provider. Also Dr. Maria Shiner has been consulted. Will follow up there input.  Certainly the next step is tissue sampling and I think pulmonary service could have the first go given that mass is invading the mediastinum.  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  Patient actually does not report any coughing or shortness of breath with eating or drinking.  Patient is still on a regular food.  Therefore will continue same and request swallow evaluation workup.  Lymphedema of leg This is chronic, continue with home dose of Lasix   DVT prophylaxis: Lovenox  while apixaban  held.    Advance Care Planning:   Code Status: Full Code   Consults: Dr. Maria Shiner. PCCM was consulted by ER provider. Arlina Lair MD  Family Communication: per patient.  Severity of Illness: The appropriate patient status for this patient is INPATIENT. Inpatient status is judged to be reasonable and necessary in order to provide the required intensity of service to ensure the patient's safety. The patient's presenting symptoms, physical exam findings, and initial radiographic and laboratory data in the context of their chronic comorbidities is felt to place them at high risk for further clinical deterioration. Furthermore, it is not anticipated that the patient will be medically stable for discharge from the hospital within 2 midnights of admission.   * I certify that at the point of admission it is my clinical judgment that the patient will require inpatient hospital care spanning beyond 2 midnights from the point of admission due to high intensity of service, high risk for further deterioration and high frequency of surveillance required.*  Author: Bennie Brave, MD 06/05/2023 8:09 PM  For on call review [email or web address].

## 2023-06-05 NOTE — Consult Note (Signed)
 NAME:  Raymond Hurst, MRN:  [id number], DOB:  Jan 08, 1951, LOS: 0 ADMISSION DATE:  06/05/2023, CONSULTATION DATE:  06/05/23 REFERRING MD:  EDP, CHIEF COMPLAINT:  lung mass   History of Present Illness:  73 yo male presented to ED with persistent and lingering cough as well as sob and progressive DOE to the point of some dyspnea when speaking. Pt and family at bedside states that he has been in his normal state of health until 4-5 months ago when he began having a cough that has progressively worsening and now resulting in hoarseness. Wife states that this has gotten exponentially worse over the past week. She states that he has been increasingly sob when walking to the point of some dyspnea with speech. He denies any increased sputum production nor hemoptysis. States that he was reluctant to come to the hospital however at his family's insistence he presented. He denies any chest pain, no presyncopal events, no ha, no pain, muscle aches. States that his chronic le edema is stable and not worsening. He denies any change in medications and endorses compliance with his home cpap.   No other complaints.  Ccm was asked to consult 2/2 imaging with large L lung mass that is obstructing the LUL pulmonary artery as well and LLL artery with pleural effusion.   Pertinent  Medical History  Afib on chronic a/c Osa compliant with cpap Chronic normocytic anemia  Significant Hospital Events: Including procedures, antibiotic start and stop dates in addition to other pertinent events   Admitted to hospital 5/5  Interim History / Subjective:    Objective   Blood pressure 112/60, pulse 78, temperature 98.1 F (36.7 C), resp. rate 19, height 6' (1.829 m), weight (!) 137.8 kg, SpO2 100%.       No intake or output data in the 24 hours ending 06/05/23 1821 Filed Weights   06/05/23 1530  Weight: (!) 137.8 kg    Examination: General: nad, reclining comfortably in bed with family at bedside HENT: ncat, poor  dentition, eomi, perrla, mmmp Lungs: diminished bilaterally Cardiovascular: irreg irreg Abdomen: soft nt/nd bs + Extremities: +++ edema with compression stockings in place, no c/c noted on unclothed skin Neuro: no focal deficits GU: deferred  Resolved Hospital Problem list     Assessment & Plan:  Lung mass, new Cough Dyspnea Dysphonia -extensive mass on imaging -on chronic eliquis  for afib (no hemoptysis reported) -pt may have diet  -will hold this tonight -thora in am for fluid -f/u path -per EDP onc has already been consulted -pt has reportedly already been admitted by hospitalist.  -prn nebs.   Best Practice (right click and "Reselect all SmartList Selections" daily)   Diet/type: Regular consistency (see orders) DVT prophylaxis SCD Pressure ulcer(s): N/A GI prophylaxis: N/A Lines: N/A Foley:  N/A Code Status:  full code Last date of multidisciplinary goals of care discussion [per primary]  Labs   CBC: Recent Labs  Lab 06/01/23 1439 06/05/23 1330  WBC 6.9 6.9  NEUTROABS 5.0  --   HGB 11.4* 12.2*  HCT 36.9* 39.9  MCV 84 84.0  PLT 231 251    Basic Metabolic Panel: Recent Labs  Lab 06/01/23 1439 06/05/23 1330  NA 137 136  K 4.2 3.7  CL 102 101  CO2 23 25  GLUCOSE 85 136*  BUN 9 6*  CREATININE 0.73* 0.80  CALCIUM  8.4* 8.8*   GFR: Estimated Creatinine Clearance: 121.8 mL/min (by C-G formula based on SCr of 0.8 mg/dL). Recent Labs  Lab 06/01/23 1439 06/05/23 1330  WBC 6.9 6.9    Liver Function Tests: Recent Labs  Lab 06/01/23 1439 06/05/23 1330  AST 12 17  ALT 9 9  ALKPHOS 73 54  BILITOT 0.5 0.7  PROT 6.2 6.5  ALBUMIN 3.0* 2.7*   No results for input(s): "LIPASE", "AMYLASE" in the last 168 hours. No results for input(s): "AMMONIA" in the last 168 hours.  ABG No results found for: "PHART", "PCO2ART", "PO2ART", "HCO3", "TCO2", "ACIDBASEDEF", "O2SAT"   Coagulation Profile: No results for input(s): "INR", "PROTIME" in the last 168  hours.  Cardiac Enzymes: No results for input(s): "CKTOTAL", "CKMB", "CKMBINDEX", "TROPONINI" in the last 168 hours.  HbA1C: Hgb A1c MFr Bld  Date/Time Value Ref Range Status  06/08/2018 12:25 AM 5.2 4.8 - 5.6 % Final    Comment:    (NOTE) Pre diabetes:          5.7%-6.4% Diabetes:              >6.4% Glycemic control for   <7.0% adults with diabetes     CBG: No results for input(s): "GLUCAP" in the last 168 hours.  Review of Systems:   As per HPI  Past Medical History:  He,  has a past medical history of Atrial fibrillation (HCC), Cellulitis of right lower leg, History of TIA (transient ischemic attack), Lymphedema, and OSA on CPAP.   Surgical History:   Past Surgical History:  Procedure Laterality Date   INNER EAR SURGERY     ORTHOPEDIC SURGERY       Social History:   reports that he quit smoking about 33 years ago. His smoking use included cigarettes. He started smoking about 53 years ago. He has a 40 pack-year smoking history. He has never used smokeless tobacco. He reports current alcohol use. He reports that he does not use drugs.   Family History:  His family history includes Atrial fibrillation in his mother; COPD in his mother; Heart failure in his mother; Idiopathic pulmonary fibrosis in his father.   Allergies No Known Allergies   Home Medications  Prior to Admission medications   Medication Sig Start Date End Date Taking? Authorizing Provider  acetaminophen  (TYLENOL ) 500 MG tablet Take 500 mg by mouth every 6 (six) hours as needed for mild pain.    [provider]  allopurinol (ZYLOPRIM) 100 MG tablet Take 100 mg by mouth daily. 05/25/22   [provider]  apixaban  (ELIQUIS ) 5 MG TABS tablet Take 1 tablet (5 mg total) by mouth 2 (two) times daily. 03/16/23   Jacqueline Matsu, MD  Cholecalciferol  (VITAMIN D ) 125 MCG (5000 UT) CAPS Take 5,000 Units by mouth daily.    [provider]  Cyanocobalamin  (VITAMIN B 12 PO) Take 1 tablet by  mouth daily.    [provider]  furosemide  (LASIX ) 40 MG tablet Take 1 tablet (40 mg total) by mouth daily. 05/31/23   [name], NP  metoprolol  tartrate (LOPRESSOR ) 25 MG tablet Take 1 tablet (25 mg total) by mouth 2 (two) times daily. 11/17/21   Dorma Gash, PA-C     Critical care time: 

## 2023-06-05 NOTE — Assessment & Plan Note (Signed)
 This is chronic, continue with home dose of Lasix 

## 2023-06-05 NOTE — ED Triage Notes (Signed)
 Pt here from home with c/o sob , pt is unable to speak without getting sob , pt family md sent him over to get a ct of his chest  for a recheck of  mass found in his chest on x ray

## 2023-06-05 NOTE — ED Notes (Signed)
 CCMD notified. Pt is on monitor.

## 2023-06-05 NOTE — Assessment & Plan Note (Signed)
 Continue with metoprolol  tartrate for rate control, hold off on apixaban  as highly likely patient will need biopsy

## 2023-06-05 NOTE — Plan of Care (Signed)

## 2023-06-05 NOTE — ED Notes (Signed)
 Patient transported to CT

## 2023-06-05 NOTE — Assessment & Plan Note (Signed)
 Given the invasion of the mediastinum by the left upper lobe lung mass, my working diagnosis is that the patient has left plus minus right recurrent laryngeal nerve palsy.  Patient actually does not report any coughing or shortness of breath with eating or drinking.  Patient is still on a regular food.  Therefore will continue same and request swallow evaluation workup.

## 2023-06-05 NOTE — ED Notes (Signed)
 Called CT to make aware of 20G In LAC

## 2023-06-06 ENCOUNTER — Encounter (HOSPITAL_COMMUNITY)
Admission: EM | Disposition: A | Discharge status: Home / Self Care | Payer: Self-pay | Source: Home / Self Care | Attending: Emergency Medicine

## 2023-06-06 ENCOUNTER — Observation Stay (HOSPITAL_COMMUNITY)

## 2023-06-06 DIAGNOSIS — Z48813 Encounter for surgical aftercare following surgery on the respiratory system: Secondary | ICD-10-CM | POA: Diagnosis not present

## 2023-06-06 DIAGNOSIS — I4891 Unspecified atrial fibrillation: Secondary | ICD-10-CM | POA: Diagnosis not present

## 2023-06-06 DIAGNOSIS — R918 Other nonspecific abnormal finding of lung field: Secondary | ICD-10-CM | POA: Diagnosis not present

## 2023-06-06 DIAGNOSIS — G4733 Obstructive sleep apnea (adult) (pediatric): Secondary | ICD-10-CM

## 2023-06-06 DIAGNOSIS — J90 Pleural effusion, not elsewhere classified: Secondary | ICD-10-CM

## 2023-06-06 HISTORY — PX: THORACENTESIS: SHX235

## 2023-06-06 LAB — APTT: aPTT: 32 s (ref 24–36)

## 2023-06-06 LAB — PROTIME-INR
INR: 1.2 (ref 0.8–1.2)
Prothrombin Time: 15.1 s (ref 11.4–15.2)

## 2023-06-06 LAB — PROTEIN, PLEURAL OR PERITONEAL FLUID: Total protein, fluid: 3.9 g/dL

## 2023-06-06 LAB — BASIC METABOLIC PANEL WITH GFR
Anion gap: 7 (ref 5–15)
BUN: 6 mg/dL — ABNORMAL LOW (ref 8–23)
CO2: 27 mmol/L (ref 22–32)
Calcium: 8.5 mg/dL — ABNORMAL LOW (ref 8.9–10.3)
Chloride: 103 mmol/L (ref 98–111)
Creatinine, Ser: 0.72 mg/dL (ref 0.61–1.24)
GFR, Estimated: >60 mL/min (ref >60)
Glucose, Bld: 108 mg/dL — ABNORMAL HIGH (ref 70–99)
Potassium: 4.4 mmol/L (ref 3.5–5.1)
Sodium: 137 mmol/L (ref 135–145)

## 2023-06-06 LAB — CBC
HCT: 39 % (ref 39.0–52.0)
Hemoglobin: 12.2 g/dL — ABNORMAL LOW (ref 13.0–17.0)
MCH: 25.8 pg — ABNORMAL LOW (ref 26.0–34.0)
MCHC: 31.3 g/dL (ref 30.0–36.0)
MCV: 82.6 fL (ref 80.0–100.0)
Platelets: 260 10*3/uL (ref 150–400)
RBC: 4.72 MIL/uL (ref 4.22–5.81)
RDW: 18.4 % — ABNORMAL HIGH (ref 11.5–15.5)
WBC: 6.5 10*3/uL (ref 4.0–10.5)
nRBC: 0 % (ref 0.0–0.2)

## 2023-06-06 LAB — BODY FLUID CELL COUNT WITH DIFFERENTIAL
Eos, Fluid: 0 %
Lymphs, Fluid: 58 %
Monocyte-Macrophage-Serous Fluid: 18 % — ABNORMAL LOW (ref 50–90)
Neutrophil Count, Fluid: 24 % (ref 0–25)
Total Nucleated Cell Count, Fluid: 1800 uL — ABNORMAL HIGH (ref 0–1000)

## 2023-06-06 LAB — LACTATE DEHYDROGENASE, PLEURAL OR PERITONEAL FLUID: LD, Fluid: 114 U/L — ABNORMAL HIGH (ref 3–23)

## 2023-06-06 SURGERY — THORACENTESIS
Anesthesia: Topical | Laterality: Left

## 2023-06-06 MED ORDER — ENOXAPARIN SODIUM 60 MG/0.6ML IJ SOSY
60.0000 mg | PREFILLED_SYRINGE | INTRAMUSCULAR | Status: DC
  Administered 2023-06-06: 60 mg via SUBCUTANEOUS
  Filled 2023-06-06: qty 0.6

## 2023-06-06 NOTE — Consult Note (Cosign Needed)
 [facility] CONSULT NOTE  Patient Care Team: Therman Flatter as PCP - General (Family Medicine) Jacqueline Matsu, MD as PCP - Cardiology (Cardiology) Devon Fogo, MD (Inactive) as Consulting Physician (Dermatology)  CHIEF COMPLAINTS/PURPOSE OF CONSULTATION:  Lung mass  REFERRING PHYSICIAN: Dr. Carlette Cheers  HISTORY OF PRESENTING ILLNESS:  Raymond Hurst 73 y.o. male who came to the ED on 06/05/2023 complaining of increasing shortness of breath.  Workup was done in the ED including imaging which showed large 10.9 x 5.6 x 7.2 cm left upper lobe mass invading the mediastinum.  There was moderate left pleural effusion.  Due to concern for malignancy oncology consult has been requested. Patient is seen awake alert and oriented x 3 sitting at side of bed eating.  Patient's wife is at bedside.  Patient and wife are good historians and all history is obtained from them.  Reports that around January 2025 he developed a cough that has not gone away.  No associated pain.  Approximately 10 days ago he developed "laryngitis".  They noted voice changes and became very concerned.  Reports she took him to see cardiologist who ordered a chest x-ray ago and was told about abnormal finding. Medical history includes A-fib for which he is on Eliquis , lymphedema for which he is on Lasix  at home. Surgical history includes tonsillectomy many years ago. Family history is noncontributory for malignancy.  However, includes patient's father with pulmonary fibrosis. Social history significant for alcohol use, admits to drinking "quite a lot of beer daily", states more recently he drinks only on the weekends.  Admits to tobacco use beginning age 55 x 10 years, quit 30 to 40 years ago.  Denies illicit or recreational drug use.  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  Patient and wife have no children but have a good support system with many friends.   I have reviewed his chart and materials related to his  cancer extensively and collaborated history with the patient. Summary of oncologic history is as follows: Oncology History   No history exists.    ASSESSMENT & PLAN:  Left Lung mass, likely bronchogenic carcinoma Pleural effusion - CT angio chest done 06/05/2023 shows 10.9 x 5.6 x 7.2 cm left upper lobe mass invading the mediastinum.  Mass is compatible with bronchogenic carcinoma. - Status post thoracentesis done 06/06/2023 with 300 mL of serosanguineous fluid drained from left pleural space.  Pending cytology. - Medical oncology following.  Patient and wife requesting Dr. Scherrie Curt.  Further oncologic evaluation and treatment options pending path.  Dysphonia Cough, unproductive - Likely secondary to malignancy - Patient reports no pain. - Continue supportive care  A-fib - Has been on Eliquis  at home - May restart when all invasive procedures completed  Lymphedema - Bilateral lower extremities - Patient reports started 5 years ago. - Continue use of compression stockings - Continue Lasix   Anemia, normocytic - Mild - Hemoglobin 12.2.  Baseline appears to be in the 11-12 range - No transfusional intervention warranted at this time. - Continue to monitor CBC with differential   MEDICAL HISTORY:  Past Medical History:  Diagnosis Date   Atrial fibrillation (HCC)    Cellulitis of right lower leg    History of TIA (transient ischemic attack)    May 2020   Lymphedema    OSA on CPAP    severe obstructive sleep apnea with an AHI of 54/h with no significant central events.  He had nocturnal hypoxemia with O2 sat  nadir of 70%. Now on CPAP    SURGICAL HISTORY: Past Surgical History:  Procedure Laterality Date   INNER EAR SURGERY     ORTHOPEDIC SURGERY      SOCIAL HISTORY: Social History   Socioeconomic History   Marital status: Married    Spouse name: Not on file   Number of children: Not on file   Years of education: Not on file   Highest education level: Not on file   Occupational History   Not on file  Tobacco Use   Smoking status: Former    Current packs/day: 0.00    Average packs/day: 2.0 packs/day for 20.0 years (40.0 ttl pk-yrs)    Types: Cigarettes    Start date: 01/31/1970    Quit date: 01/31/1990    Years since quitting: 33.3   Smokeless tobacco: Never  Vaping Use   Vaping status: Never Used  Substance and Sexual Activity   Alcohol use: Yes    Comment: 4-5 beer daily   Drug use: No   Sexual activity: Not on file  Other Topics Concern   Not on file  Social History Narrative   Right handed.    Social Drivers of Corporate investment banker Strain: Not on file  Food Insecurity: No Food Insecurity (06/05/2023)   Hunger Vital Sign    Worried About Running Out of Food in the Last Year: Never true    Ran Out of Food in the Last Year: Never true  Transportation Needs: No Transportation Needs (06/05/2023)   PRAPARE - Administrator, Civil Service (Medical): No    Lack of Transportation (Non-Medical): No  Physical Activity: Not on file  Stress: Not on file  Social Connections: Unknown (06/05/2023)   Social Connection and Isolation Panel [NHANES]    Frequency of Communication with Friends and Family: Three times a week    Frequency of Social Gatherings with Friends and Family: Three times a week    Attends Religious Services: More than 4 times per year    Active Member of Clubs or Organizations: Patient declined    Attends Banker Meetings: More than 4 times per year    Marital Status: Patient declined  Intimate Partner Violence: Not At Risk (06/05/2023)   Humiliation, Afraid, Rape, and Kick questionnaire    Fear of Current or Ex-Partner: No    Emotionally Abused: No    Physically Abused: No    Sexually Abused: No    FAMILY HISTORY: Family History  Problem Relation Age of Onset   Heart failure Mother    COPD Mother    Atrial fibrillation Mother    Idiopathic pulmonary fibrosis Father      PHYSICAL  EXAMINATION: ECOG PERFORMANCE STATUS: 1 - Symptomatic but completely ambulatory  Vitals:   06/06/23 1400 06/06/23 1410  BP: 113/70 114/74  Pulse: 75 77  Resp: (!) 23 (!) 21  Temp:    SpO2: 96% 97%   Filed Weights   06/05/23 1530  Weight: (!) 303 lb 12.7 oz (137.8 kg)    GENERAL: alert, no distress and comfortable SKIN: skin color, texture, turgor are normal, no rashes or significant lesions EYES: normal, conjunctiva are pink and non-injected, sclera clear OROPHARYNX: no exudate, no erythema and lips, buccal mucosa, and tongue normal, oral cavity without visible mass NECK: supple, thyroid  normal size, non-tender, without nodularity LYMPH: no palpable lymphadenopathy in the cervical, axillary or inguinal LUNGS: + Rales to auscultation HEART: + bilateral lower extremity edema 4+ ABDOMEN:  abdomen soft, non-tender and normal bowel sounds, no hepatosplenomegaly MUSCULOSKELETAL: no cyanosis of digits and no clubbing  PSYCH: alert & oriented x 3 with fluent speech NEURO: no focal motor/sensory deficits   ALLERGIES:  has no known allergies.  MEDICATIONS:  Current Facility-Administered Medications  Medication Dose Route Frequency Provider Last Rate Last Admin   acetaminophen  (TYLENOL ) tablet 650 mg  650 mg Oral Q6H PRN Bennie Brave, MD   650 mg at 06/06/23 5284   Or   acetaminophen  (TYLENOL ) suppository 650 mg  650 mg Rectal Q6H PRN Bennie Brave, MD       allopurinol (ZYLOPRIM) tablet 100 mg  100 mg Oral Daily Bennie Brave, MD   100 mg at 06/06/23 0826   enoxaparin  (LOVENOX ) injection 60 mg  60 mg Subcutaneous Q24H Denson Flake, MD       furosemide  (LASIX ) tablet 40 mg  40 mg Oral Daily Bennie Brave, MD   40 mg at 06/06/23 1324   metoprolol  tartrate (LOPRESSOR ) tablet 25 mg  25 mg Oral BID Bennie Brave, MD   25 mg at 06/06/23 0826   polyethylene glycol (MIRALAX / GLYCOLAX) packet 17 g  17 g Oral Daily PRN Bennie Brave, MD       sodium chloride  flush (NS) 0.9 % injection 3 mL  3 mL  Intravenous Q12H Bennie Brave, MD   3 mL at 06/06/23 0827     LABORATORY DATA:  I have reviewed the data as listed Lab Results  Component Value Date   WBC 6.5 06/06/2023   HGB 12.2 (L) 06/06/2023   HCT 39.0 06/06/2023   MCV 82.6 06/06/2023   PLT 260 06/06/2023   Recent Labs    07/21/22 1859 07/22/22 0010 07/23/22 0626 06/01/23 1439 06/05/23 1330 06/06/23 0638  NA 134*   < > 138 137 136 137  K 3.9   < > 3.9 4.2 3.7 4.4  CL 102   < > 106 102 101 103  CO2 25   < > 26 23 25 27   GLUCOSE 75   < > 120* 85 136* 108*  BUN 9   < > 10 9 6* 6*  CREATININE 1.04   < > 0.94 0.73* 0.80 0.72  CALCIUM  8.8*   < > 8.5* 8.4* 8.8* 8.5*  GFRNONAA >60   < > >60  --  >60 >60  PROT 6.9  --   --  6.2 6.5  --   ALBUMIN 3.2*  --  2.8* 3.0* 2.7*  --   AST 20  --   --  12 17  --   ALT 17  --   --  9 9  --   ALKPHOS 42  --   --  73 54  --   BILITOT 0.7  --   --  0.5 0.7  --    < > = values in this interval not displayed.    RADIOGRAPHIC STUDIES: I have personally reviewed the radiological images as listed and agreed with the findings in the report. CT Angio Chest PE W/Cm &/Or Wo Cm Result Date: 06/05/2023 CLINICAL DATA:  Shortness of breath.  Left upper lobe mass. * Tracking Code: BO * EXAM: CT ANGIOGRAPHY CHEST WITH CONTRAST TECHNIQUE: Multidetector CT imaging of the chest was performed using the standard protocol during bolus administration of intravenous contrast. Multiplanar CT image reconstructions and MIPs were obtained to evaluate the vascular anatomy. RADIATION DOSE REDUCTION: This exam was performed according to the departmental dose-optimization program which includes  automated exposure control, adjustment of the mA and/or kV according to patient size and/or use of iterative reconstruction technique. CONTRAST:  75mL OMNIPAQUE  IOHEXOL  350 MG/ML SOLN COMPARISON:  Chest radiograph 06/01/2023 FINDINGS: Despite efforts by the technologist and patient, motion artifact is present on today's exam and  could not be eliminated. This reduces exam sensitivity and specificity. Cardiovascular: Left upper lobe branches of the left pulmonary artery are effaced or occluded due to a 10.9 by 5.6 by 7.2 cm left upper lobe mass which invades the aorta pulmonary window and mediastinum. This also causes narrowing of the left lower lobe pulmonary artery and pulmonary artery invasion by tumor is not completely excluded. Mass abuts the left lateral pleural margin and obviously extends across the mediastinal pleural margin. There is a moderate left pleural effusion malignant effusion is a distinct possibility. Separate from this mass effect causing narrowing and occlusion of portions of the left upper lobe pulmonary artery, I do not see a separate pulmonary embolus. Lower lobe pulmonary arteries are somewhat obscured by motion artifact. Coronary, aortic arch, and branch vessel atherosclerotic vascular disease. Moderate cardiomegaly. Mediastinum/Nodes: 1.1 cm right paratracheal lymph node on image 41 series 5. Additional clustered right lower paratracheal lymph nodes are present. The dominant left upper lobe mass invades the mediastinum in the vicinity of the hilum and AP window. Along the posterior mediastinum, there are hypodense nodules in the paraspinal location especially along the right, query extramedullary hematopoiesis. Lungs/Pleura: Occlusion of the left superior lobar bronchus by the large left upper lobe mass. The lingular bronchus is narrowed but not occluded. Patchy ground-glass opacities in the remainder of the left upper lobe and left lower lobe with passive atelectasis of much of the left lower lobe. Hazy ground-glass density in the right lung is present as well, raising the possibility of pulmonary edema. Upper Abdomen: 7.1 by 4.7 cm right adrenal mass with some fatty elements favoring myelolipoma. Musculoskeletal: Thoracic spondylosis. Lucent lesion along the right scapular neck is probably benign. Review of the  MIP images confirms the above findings. IMPRESSION: 1. 10.9 by 5.6 by 7.2 cm left upper lobe mass which invades the mediastinum in the vicinity of the hilum and AP window. This causes effacement or occlusion of portions of the left upper lobe pulmonary artery and narrowing of the left lower lobe pulmonary artery. Pulmonary artery invasion by tumor is not completely excluded. The mass is compatible with bronchogenic carcinoma. Pulmonology and oncology consultations may be indicated for tissue sampling further workup. 2. Moderate left pleural effusion, malignant effusion is a distinct possibility. 3. No obvious filling defect in the visible pulmonary arterial tree to indicate typical pulmonary embolus, with the understanding that part of the left upper lobe pulmonary arterial tree is occluded by the mass, and portions of the lower lobe pulmonary arterial tree bilaterally are obscured by motion artifact. 4. Patchy ground-glass opacities in the remainder of the left upper lobe and left lower lobe with passive atelectasis of much of the left lower lobe. Hazy ground-glass density in the right lung is present as well, raising the possibility of pulmonary edema. 5. Moderate cardiomegaly. 6. Coronary, aortic arch, and branch vessel atherosclerotic vascular disease. Aortic Atherosclerosis (ICD10-I70.0). 7. 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 8. Lucent lesion along the right scapular neck is probably benign. Electronically Signed   By: Freida Jes M.D.   On: 06/05/2023 17:44   DG Chest 2 View Result Date: 06/01/2023 CLINICAL DATA:  Cough for several  weeks EXAM: CHEST - 2 VIEW COMPARISON:  04/21/2020 FINDINGS: Frontal and lateral views of the chest demonstrate an enlarged cardiac silhouette. There is a large masslike area of consolidation within the left upper lobe along the major fissure, measuring up to 9.1 cm in size. This could reflect pneumonia in the appropriate  clinical setting, and close follow-up after medical management is recommended to document resolution and exclude underlying mass. Increased density at the left hilum may reflect adenopathy. Small left pleural effusion. Right chest is clear. No acute bony abnormalities. IMPRESSION: 1. Large area of masslike consolidation within the left upper lobe, with associated left hilar prominence suggesting underlying adenopathy. While pneumonia could give this appearance, the radiographic pattern is somewhat concerning for underlying lung mass. If the patient has current signs or symptoms of infection, close imaging follow-up after appropriate medical management is recommended to document resolution. If there are no current signs of infection, CT chest with IV contrast is recommended to assess for underlying neoplasm. 2. Small left pleural effusion. These results will be called to the ordering clinician or representative by the Radiologist Assistant, and communication documented in the PACS or [employer]. Electronically Signed   By: Bobbye Burrow M.D.   On: 06/01/2023 15:17     The total time spent in the appointment was 55 minutes encounter with patients including review of chart and various tests results, discussions about plan of care and coordination of care plan   All questions were answered. The patient knows to call the clinic with any problems, questions or concerns. No barriers to learning was detected.  Jacqualin Mate, NP 5/6/20253:59 PM Mr. Norwick was interviewed and examined.  I reviewed the admission x-rays.  He presents with hoarseness for 10 days.  He reports a cough since January.  A chest x-ray revealed a left upper lung mass and a left pleural effusion.  A CT of the chest confirmed a left upper lobe mass with invasion of the mediastinum and occlusion of the left upper and lower lobe pulmonary artery.  There is a left pleural effusion.  There is a known right adrenal mass consistent with a  myolipoma.  There are small mediastinal lymph nodes.  He underwent a diagnostic thoracentesis yesterday.  The cytology is pending.  I discussed the probable diagnosis of lung cancer with Mr. Evatt.  He understands treatment recommendations will depend on the final pathology and additional staging.  The tumor does not appear to be resectable. He will need to undergo a diagnostic bronchoscopy if the pleural fluid cytology is negative.  Impression: Left lung mass CT chest 06/05/2023: Left upper lobe mass with invasion of the mediastinum and effacement of the left upper lobe and lower lobe pulmonary arteries, moderate left pleural effusion, small mediastinal lymph nodes, right adrenal mass favoring myolipoma, probable benign lucent lesion at the right scapula Diagnostic left thoracentesis 06/06/2023: Cytology pending  2.  Hoarseness/cough secondary to #1 3.  Atrial fibrillation 4.  Lower extremity edema 5.  Alcohol use  Recommendations: Follow-up pleural fluid cytology, diagnostic bronchoscopy if the cytology is negative Outpatient staging PET scan and brain MRI depending on the final pathology result We can consider inpatient chemotherapy if a diagnosis of small cell carcinoma is confirmed Outpatient follow-up will be scheduled at the Cancer center

## 2023-06-06 NOTE — Progress Notes (Signed)
 Pt transported to xray via wheelchair by transportation staff.

## 2023-06-06 NOTE — Progress Notes (Signed)
   06/06/23 1950  BiPAP/CPAP/SIPAP  Reason BIPAP/CPAP not in use Non-compliant  BiPAP/CPAP /SiPAP Vitals  Pulse Rate 71  Resp 20  SpO2 98 %  Bilateral Breath Sounds Clear;Diminished  MEWS Score/Color  MEWS Score 0  MEWS Score Color Raymond Hurst

## 2023-06-06 NOTE — Progress Notes (Signed)
  Progress Note   Patient: Raymond Hurst ZOX:[id number] DOB: [birth year] DOA: 06/05/2023     0 DOS: the patient was seen and examined on 06/06/2023   Assessment and Plan: * Lung mass - s/p thoracentesis 300 cc removed on 06/06/2023 - Appreciate pulmonary following  - Awaiting recommendations from oncology note as of 3:16PM 06/06/2023 - F/u cytology - Tylenol  PRN   Paroxysmal atrial fibrillation (HCC) - Lopressor  25 mg PO bid   Dysphonia - swallow evaluation appreciated   Lymphedema of leg - Lasix  40 mg PO daily   Subjective: Pt seen and examined at the bedside.  Appreciate pulmonary assistance. S/p thoracentesis today with 300 cc removed. F/u cytology. Awaiting recommendations from oncology.      Physical Exam: Vitals:   06/06/23 1340 06/06/23 1350 06/06/23 1400 06/06/23 1410  BP: 102/73 (!) 106/55 113/70 114/74  Pulse: 74 74 75 77  Resp: (!) 22 (!) 21 (!) 23 (!) 21  Temp:      TempSrc:      SpO2: 97% 97% 96% 97%  Weight:      Height:       Physical Exam HENT:     Head: Normocephalic.  Cardiovascular:     Rate and Rhythm: Normal rate.  Pulmonary:     Effort: Pulmonary effort is normal.  Musculoskeletal:     Cervical back: Neck supple.  Skin:    General: Skin is warm.     Comments: 1+ edema b/l lower legs   Neurological:     Mental Status: He is alert.  Psychiatric:        Mood and Affect: Mood normal.      Disposition: Status is: Observation The patient remains OBS appropriate and will d/c before 2 midnights.  Planned Discharge Destination: Home    Time spent: 35 minutes  Author: Dao Mearns , MD 06/06/2023 3:15 PM  For on call review [email or web address].

## 2023-06-06 NOTE — Progress Notes (Signed)
 Report called to Endo. They will be picking pt up at 1:30p for thoracentesis. Pt made aware. They will do consent in Endo.

## 2023-06-06 NOTE — H&P (View-Only) (Signed)
 NAME:  Raymond Hurst, MRN:  [id number], DOB:  [birth year], LOS: 0 ADMISSION DATE:  06/05/2023, CONSULTATION DATE:  06/05/23 REFERRING MD:  EDP, CHIEF COMPLAINT:  lung mass   History of Present Illness:  73 yo male presented to ED with persistent and lingering cough as well as sob and progressive DOE to the point of some dyspnea when speaking. Pt and family at bedside states that he has been in his normal state of health until 4-5 months ago when he began having a cough that has progressively worsening and now resulting in hoarseness. Wife states that this has gotten exponentially worse over the past week. She states that he has been increasingly sob when walking to the point of some dyspnea with speech. He denies any increased sputum production nor hemoptysis. States that he was reluctant to come to the hospital however at his family's insistence he presented. He denies any chest pain, no presyncopal events, no ha, no pain, muscle aches. States that his chronic le edema is stable and not worsening. He denies any change in medications and endorses compliance with his home cpap.   No other complaints.  Ccm was asked to consult 2/2 imaging with large L lung mass that is obstructing the LUL pulmonary artery as well and LLL artery with pleural effusion.   Pertinent  Medical History  Afib on chronic a/c Osa compliant with cpap Chronic normocytic anemia  Significant Hospital Events: Including procedures, antibiotic start and stop dates in addition to other pertinent events   Admitted to hospital 5/5  Interim History / Subjective:  Feels well.  Still has a hoarse voice Did not wear CPAP overnight, was not ordered His last Eliquis  was the morning of 5/5  Objective   Blood pressure 130/66, pulse 87, temperature 98.9 F (37.2 C), resp. rate 17, height 6' (1.829 m), weight (!) 137.8 kg, SpO2 98%.       No intake or output data in the 24 hours ending 06/06/23 0804 Filed Weights   06/05/23 1530   Weight: (!) 137.8 kg    Examination: General: Sitting up in bedside, no distress HENT: Poor dentition, hoarse gravelly voice Lungs: Clear on the right, decreased breath sounds on the left Cardiovascular: Irregular, no murmur Abdomen: Obese, nondistended with positive bowel sounds Extremities: Chronic edema and venous stasis changes bilateral feet, SCD in place Neuro: Awake alert appropriate, follows commands, nonfocal  Resolved Hospital Problem list     Assessment & Plan:   Left hilar mass, principally in the left upper lobe impacting mediastinum Moderate left pleural effusion, possible malignant effusion Dyspnea, cough, dysphonia  -Agree with left thoracentesis.  His last Eliquis  was 5/5 in the AM -Left upper lobe airway narrowing but unclear whether there is an endobronchial lesion.  Would plan for bronchoscopy with EBUS to access the left hilum if his left pleural effusion does not give a diagnosis of malignancy  Atrial fibrillation on anticoagulation - Eliquis  currently held - He is on prophylactic enoxaparin , will change his dosing to p.m. to facilitate thoracentesis today  OSA - Continue his nocturnal CPAP  Best Practice (right click and "Reselect all SmartList Selections" daily)   Diet/type: Regular consistency (see orders) DVT prophylaxis SCD Pressure ulcer(s): N/A GI prophylaxis: N/A Lines: N/A Foley:  N/A Code Status:  full code Last date of multidisciplinary goals of care discussion [per primary]  Labs   CBC: Recent Labs  Lab 06/01/23 1439 06/05/23 1330 06/06/23 0638  WBC 6.9 6.9 6.5  NEUTROABS 5.0  --   --  HGB 11.4* 12.2* 12.2*  HCT 36.9* 39.9 39.0  MCV 84 84.0 82.6  PLT 231 251 260    Basic Metabolic Panel: Recent Labs  Lab 06/01/23 1439 06/05/23 1330 06/06/23 0638  NA 137 136 137  K 4.2 3.7 4.4  CL 102 101 103  CO2 23 25 27   GLUCOSE 85 136* 108*  BUN 9 6* 6*  CREATININE 0.73* 0.80 0.72  CALCIUM  8.4* 8.8* 8.5*   GFR: Estimated  Creatinine Clearance: 121.8 mL/min (by C-G formula based on SCr of 0.72 mg/dL). Recent Labs  Lab 06/01/23 1439 06/05/23 1330 06/06/23 0638  WBC 6.9 6.9 6.5    Liver Function Tests: Recent Labs  Lab 06/01/23 1439 06/05/23 1330  AST 12 17  ALT 9 9  ALKPHOS 73 54  BILITOT 0.5 0.7  PROT 6.2 6.5  ALBUMIN 3.0* 2.7*   No results for input(s): "LIPASE", "AMYLASE" in the last 168 hours. No results for input(s): "AMMONIA" in the last 168 hours.  ABG No results found for: "PHART", "PCO2ART", "PO2ART", "HCO3", "TCO2", "ACIDBASEDEF", "O2SAT"   Coagulation Profile: No results for input(s): "INR", "PROTIME" in the last 168 hours.  Cardiac Enzymes: No results for input(s): "CKTOTAL", "CKMB", "CKMBINDEX", "TROPONINI" in the last 168 hours.  HbA1C: Hgb A1c MFr Bld  Date/Time Value Ref Range Status  06/08/2018 12:25 AM 5.2 4.8 - 5.6 % Final    Comment:    (NOTE) Pre diabetes:          5.7%-6.4% Diabetes:              >6.4% Glycemic control for   <7.0% adults with diabetes     Critical care time: N/A     Racheal Buddle, MD, PhD 06/06/2023, 8:04 AM [facility] [phone] or if no answer before 7:00PM call [phone] For any issues after 7:00PM please call eLink [phone]

## 2023-06-06 NOTE — Evaluation (Signed)
 Clinical/Bedside Swallow Evaluation Patient Details  Name: Raymond Hurst MRN: [id number] Date of Birth: Apr 19, 1950  Today's Date: 06/06/2023 Time: SLP Start Time (ACUTE ONLY): 4098 SLP Stop Time (ACUTE ONLY): 0930 SLP Time Calculation (min) (ACUTE ONLY): 14 min  Past Medical History:  Past Medical History:  Diagnosis Date   Atrial fibrillation (HCC)    Cellulitis of right lower leg    History of TIA (transient ischemic attack)    May 2020   Lymphedema    OSA on CPAP    severe obstructive sleep apnea with an AHI of 54/h with no significant central events.  He had nocturnal hypoxemia with O2 sat nadir of 70%. Now on CPAP   Past Surgical History:  Past Surgical History:  Procedure Laterality Date   INNER EAR SURGERY     ORTHOPEDIC SURGERY     HPI:  73 yo male presenting 5/5 with persistent cough, dyspnea, and hoarseness. Found to have L hilar mass. Per MD note, concern for RLN palsy as source of dysphonia in setting of mass. Pt says that he has noticed some reduced range over the last few months (is a backup singer in a band), but that his voice became acutely hoarse ~10 days PTA. PMH includes: afib, lymphedemaTIA, OSA on CPAP    Assessment / Plan / Recommendation  Clinical Impression  Pt's voice is hoarse and there is reduced crispness to his cough, although otherwise presenting with a functional oral motor exam. There is no deviation of the uvula, although if there is RLN involvement it is less likely for it to be from a central source. There is a baseline cough that does not appear to be overtly exacerbated by PO intake, and pt denies any subjective symptoms of dysphagia. He actually thinks his coughing has subsided since yesterday. Will leave on current diet but given potential for reduced sensation if there is RLN involvement, still recommend proceeding with instrumental swallow study for closer evaluation. Favor FEES so that we can have direct visualization of his vocal folds. He is  in agreement with this test, which will be scheduled on next date at the earliest given that he is scheduled for thoracentesis at some point today.   SLP Visit Diagnosis: Dysphagia, unspecified (R13.10)    Aspiration Risk       Diet Recommendation Regular;Thin liquid    Liquid Administration via: Cup;Straw Medication Administration: Whole meds with liquid Supervision: Patient able to self feed;Intermittent supervision to cue for compensatory strategies Compensations: Slow rate;Small sips/bites Postural Changes: Seated upright at 90 degrees    Other  Recommendations Oral Care Recommendations: Oral care QID    Recommendations for follow up therapy are one component of a multi-disciplinary discharge planning process, led by the attending physician.  Recommendations may be updated based on patient status, additional functional criteria and insurance authorization.  Follow up Recommendations Outpatient SLP      Assistance Recommended at Discharge    Functional Status Assessment Patient has had a recent decline in their functional status and demonstrates the ability to make significant improvements in function in a reasonable and predictable amount of time.  Frequency and Duration            Prognosis        Swallow Study   General HPI: 73 yo male presenting 5/5 with persistent cough, dyspnea, and hoarseness. Found to have L hilar mass. Per MD note, concern for RLN palsy as source of dysphonia in setting of mass. Pt says that he  has noticed some reduced range over the last few months (is a backup singer in a band), but that his voice became acutely hoarse ~10 days PTA. PMH includes: afib, lymphedemaTIA, OSA on CPAP Type of Study: Bedside Swallow Evaluation Previous Swallow Assessment: none in chart Diet Prior to this Study: Regular;Thin liquids (Level 0) Temperature Spikes Noted: No Respiratory Status: Room air History of Recent Intubation: No Behavior/Cognition:  Cooperative;Alert;Pleasant mood Oral Cavity Assessment: Within Functional Limits Oral Care Completed by SLP: No Oral Cavity - Dentition: Adequate natural dentition;Poor condition Vision: Functional for self-feeding Self-Feeding Abilities: Able to feed self Patient Positioning: Other (comment) (EOB) Baseline Vocal Quality: Hoarse Volitional Cough: Other (Comment) (fairly strong but not very crisp) Volitional Swallow: Able to elicit    Oral/Motor/Sensory Function Overall Oral Motor/Sensory Function: Within functional limits   Ice Chips Ice chips: Not tested   Thin Liquid Thin Liquid: Within functional limits Presentation: Cup;Self Fed    Nectar Thick Nectar Thick Liquid: Not tested   Honey Thick Honey Thick Liquid: Not tested   Puree Puree: Not tested   Solid     Solid: Within functional limits Presentation: Self Fed      Beth Brooke., M.A. CCC-SLP Acute Rehabilitation Services Office: [phone]  Secure chat preferred  06/06/2023,9:47 AM

## 2023-06-06 NOTE — Procedures (Signed)
 Thoracentesis  Procedure Note  Raymond Hurst  [id number]  July 03, 1950  Date:06/06/23  Time:2:05 PM   Provider Performing:Sanjeev Main   Procedure: Thoracentesis with imaging guidance (40981)  Indication(s) Pleural Effusion  Consent Risks of the procedure as well as the alternatives and risks of each were explained to the patient and/or caregiver.  Consent for the procedure was obtained and is signed in the bedside chart  Anesthesia Topical only with 1% lidocaine    Time Out Verified patient identification, verified procedure, site/side was marked, verified correct patient position, special equipment/implants available, medications/allergies/relevant history reviewed, required imaging and test results available.   Sterile Technique Maximal sterile technique including full sterile barrier drape, hand hygiene, sterile gown, sterile gloves, mask, hair covering, sterile ultrasound probe cover (if used).  Procedure Description Ultrasound was used to identify appropriate pleural anatomy for placement and overlying skin marked.  Area of drainage cleaned and draped in sterile fashion. Lidocaine was used to anesthetize the skin and subcutaneous tissue.  300 cc's of serosanguinous appearing fluid was drained from the left pleural space. Catheter then removed and bandaid applied to site.   Complications/Tolerance None; patient tolerated the procedure well. Chest X-ray is ordered to confirm no post-procedural complication.   EBL Minimal   Specimen(s) Pleural fluid sent for cell count, chemistry, culture and cytology.  Arlina Lair, MD [facility] ICU Physician [facility]  Pager: [phone] Or Epic Secure Chat After hours: [phone].  06/06/2023, 2:06 PM

## 2023-06-06 NOTE — Progress Notes (Signed)
 Pt transported to Endo for thoracentesis via wheelchair by transportation staff.

## 2023-06-06 NOTE — Consult Note (Signed)
 NAME:  Raymond Hurst, MRN:  [id number], DOB:  [birth year], LOS: 0 ADMISSION DATE:  06/05/2023, CONSULTATION DATE:  06/05/23 REFERRING MD:  EDP, CHIEF COMPLAINT:  lung mass   History of Present Illness:  73 yo male presented to ED with persistent and lingering cough as well as sob and progressive DOE to the point of some dyspnea when speaking. Pt and family at bedside states that he has been in his normal state of health until 4-5 months ago when he began having a cough that has progressively worsening and now resulting in hoarseness. Wife states that this has gotten exponentially worse over the past week. She states that he has been increasingly sob when walking to the point of some dyspnea with speech. He denies any increased sputum production nor hemoptysis. States that he was reluctant to come to the hospital however at his family's insistence he presented. He denies any chest pain, no presyncopal events, no ha, no pain, muscle aches. States that his chronic le edema is stable and not worsening. He denies any change in medications and endorses compliance with his home cpap.   No other complaints.  Ccm was asked to consult 2/2 imaging with large L lung mass that is obstructing the LUL pulmonary artery as well and LLL artery with pleural effusion.   Pertinent  Medical History  Afib on chronic a/c Osa compliant with cpap Chronic normocytic anemia  Significant Hospital Events: Including procedures, antibiotic start and stop dates in addition to other pertinent events   Admitted to hospital 5/5  Interim History / Subjective:  Feels well.  Still has a hoarse voice Did not wear CPAP overnight, was not ordered His last Eliquis  was the morning of 5/5  Objective   Blood pressure 130/66, pulse 87, temperature 98.9 F (37.2 C), resp. rate 17, height 6' (1.829 m), weight (!) 137.8 kg, SpO2 98%.       No intake or output data in the 24 hours ending 06/06/23 0804 Filed Weights   06/05/23 1530   Weight: (!) 137.8 kg    Examination: General: Sitting up in bedside, no distress HENT: Poor dentition, hoarse gravelly voice Lungs: Clear on the right, decreased breath sounds on the left Cardiovascular: Irregular, no murmur Abdomen: Obese, nondistended with positive bowel sounds Extremities: Chronic edema and venous stasis changes bilateral feet, SCD in place Neuro: Awake alert appropriate, follows commands, nonfocal  Resolved Hospital Problem list     Assessment & Plan:   Left hilar mass, principally in the left upper lobe impacting mediastinum Moderate left pleural effusion, possible malignant effusion Dyspnea, cough, dysphonia  -Agree with left thoracentesis.  His last Eliquis  was 5/5 in the AM -Left upper lobe airway narrowing but unclear whether there is an endobronchial lesion.  Would plan for bronchoscopy with EBUS to access the left hilum if his left pleural effusion does not give a diagnosis of malignancy  Atrial fibrillation on anticoagulation - Eliquis  currently held - He is on prophylactic enoxaparin , will change his dosing to p.m. to facilitate thoracentesis today  OSA - Continue his nocturnal CPAP  Best Practice (right click and "Reselect all SmartList Selections" daily)   Diet/type: Regular consistency (see orders) DVT prophylaxis SCD Pressure ulcer(s): N/A GI prophylaxis: N/A Lines: N/A Foley:  N/A Code Status:  full code Last date of multidisciplinary goals of care discussion [per primary]  Labs   CBC: Recent Labs  Lab 06/01/23 1439 06/05/23 1330 06/06/23 0638  WBC 6.9 6.9 6.5  NEUTROABS 5.0  --   --  HGB 11.4* 12.2* 12.2*  HCT 36.9* 39.9 39.0  MCV 84 84.0 82.6  PLT 231 251 260    Basic Metabolic Panel: Recent Labs  Lab 06/01/23 1439 06/05/23 1330 06/06/23 0638  NA 137 136 137  K 4.2 3.7 4.4  CL 102 101 103  CO2 23 25 27   GLUCOSE 85 136* 108*  BUN 9 6* 6*  CREATININE 0.73* 0.80 0.72  CALCIUM  8.4* 8.8* 8.5*   GFR: Estimated  Creatinine Clearance: 121.8 mL/min (by C-G formula based on SCr of 0.72 mg/dL). Recent Labs  Lab 06/01/23 1439 06/05/23 1330 06/06/23 0638  WBC 6.9 6.9 6.5    Liver Function Tests: Recent Labs  Lab 06/01/23 1439 06/05/23 1330  AST 12 17  ALT 9 9  ALKPHOS 73 54  BILITOT 0.5 0.7  PROT 6.2 6.5  ALBUMIN 3.0* 2.7*   No results for input(s): "LIPASE", "AMYLASE" in the last 168 hours. No results for input(s): "AMMONIA" in the last 168 hours.  ABG No results found for: "PHART", "PCO2ART", "PO2ART", "HCO3", "TCO2", "ACIDBASEDEF", "O2SAT"   Coagulation Profile: No results for input(s): "INR", "PROTIME" in the last 168 hours.  Cardiac Enzymes: No results for input(s): "CKTOTAL", "CKMB", "CKMBINDEX", "TROPONINI" in the last 168 hours.  HbA1C: Hgb A1c MFr Bld  Date/Time Value Ref Range Status  06/08/2018 12:25 AM 5.2 4.8 - 5.6 % Final    Comment:    (NOTE) Pre diabetes:          5.7%-6.4% Diabetes:              >6.4% Glycemic control for   <7.0% adults with diabetes     Critical care time: N/A     Racheal Buddle, MD, PhD 06/06/2023, 8:04 AM [facility] [phone] or if no answer before 7:00PM call [phone] For any issues after 7:00PM please call eLink [phone]

## 2023-06-06 NOTE — Plan of Care (Signed)

## 2023-06-07 ENCOUNTER — Ambulatory Visit: Admitting: Student in an Organized Health Care Education/Training Program

## 2023-06-07 DIAGNOSIS — R918 Other nonspecific abnormal finding of lung field: Secondary | ICD-10-CM | POA: Diagnosis not present

## 2023-06-07 LAB — MAGNESIUM: Magnesium: 1.9 mg/dL (ref 1.7–2.4)

## 2023-06-07 LAB — CBC
HCT: 38.8 % — ABNORMAL LOW (ref 39.0–52.0)
Hemoglobin: 12.3 g/dL — ABNORMAL LOW (ref 13.0–17.0)
MCH: 26.1 pg (ref 26.0–34.0)
MCHC: 31.7 g/dL (ref 30.0–36.0)
MCV: 82.2 fL (ref 80.0–100.0)
Platelets: 249 10*3/uL (ref 150–400)
RBC: 4.72 MIL/uL (ref 4.22–5.81)
RDW: 18.1 % — ABNORMAL HIGH (ref 11.5–15.5)
WBC: 7 10*3/uL (ref 4.0–10.5)
nRBC: 0 % (ref 0.0–0.2)

## 2023-06-07 LAB — COMPREHENSIVE METABOLIC PANEL WITH GFR
ALT: 11 U/L (ref 0–44)
AST: 12 U/L — ABNORMAL LOW (ref 15–41)
Albumin: 2.6 g/dL — ABNORMAL LOW (ref 3.5–5.0)
Alkaline Phosphatase: 50 U/L (ref 38–126)
Anion gap: 9 (ref 5–15)
BUN: 9 mg/dL (ref 8–23)
CO2: 25 mmol/L (ref 22–32)
Calcium: 8.7 mg/dL — ABNORMAL LOW (ref 8.9–10.3)
Chloride: 102 mmol/L (ref 98–111)
Creatinine, Ser: 0.83 mg/dL (ref 0.61–1.24)
GFR, Estimated: >60 mL/min (ref >60)
Glucose, Bld: 113 mg/dL — ABNORMAL HIGH (ref 70–99)
Potassium: 3.8 mmol/L (ref 3.5–5.1)
Sodium: 136 mmol/L (ref 135–145)
Total Bilirubin: 0.6 mg/dL (ref 0.0–1.2)
Total Protein: 6.4 g/dL — ABNORMAL LOW (ref 6.5–8.1)

## 2023-06-07 LAB — CYTOLOGY - NON PAP

## 2023-06-07 LAB — C-REACTIVE PROTEIN: CRP: 1.9 mg/dL — ABNORMAL HIGH (ref <1.0)

## 2023-06-07 NOTE — Care Management Obs Status (Signed)
 MEDICARE OBSERVATION STATUS NOTIFICATION   Patient Details  Name: Raymond Hurst MRN: [id number] Date of Birth: Apr 17, 1950   Medicare Observation Status Notification Given:  Yes    Felix Host 06/07/2023, 10:32 AM

## 2023-06-07 NOTE — Discharge Summary (Signed)
 Physician Discharge Summary  SAITH OWSIANY HYQ:[id number] DOB: 11/06/1950 DOA: 06/05/2023  PCP: Gwyndolyn Lerner, PA-C  Admit date: 06/05/2023 Discharge date: 06/07/2023  Admitted From: Home Disposition: Home  Recommendations for Outpatient Follow-up:  Follow up with PCP in 1-2 weeks Follow-up with pulmonology outpatient for consideration of bronchoscopy with biopsy if cytology inconclusive Outpatient follow-up with medical oncology, Dr. Scherrie Curt Outpatient referral to ENT for vocal cord paralysis likely secondary to lung mass Please follow up on the following pending results: Cytology results from thoracentesis  Home Health: No Equipment/Devices: None  Discharge Condition: Stable CODE STATUS: Full code Diet recommendation: Heart healthy diet  History of present illness:  Raymond Hurst is a 73 year old male with past medical history significant for permanent atrial fibrillation on Eliquis , lymphedema, history of TIA, OSA on CPAP presented to [facility] ED on 06/05/2023 with shortness of breath, hoarse voice and by recommendation of his PCP for finding of a mass on outpatient chest x-ray.  Patient reports in the usual state of health until January 05, 2023 when he started to have a new dry cough with slowly progressive hoarseness of his voice.  Has chronic lower extremity swelling due to lymphedema but no significant change.  Denies chest pain.  In the ED, temperature 98.1 F, HR 74, RR 17, BP 109/55, SpO2 97% on room air.  WBC 6.9, hemoglobin 12.2, platelet count 251.  Sodium 136, potassium 3.7, chloride 101, CO2 25, glucose 136, BUN 6, creatinine 0.80.  AST 17, ALT 9, total bilirubin 0.7.  BNP 166.2.  High sensitive troponin 7>5.  INR 1.2.  CT angiogram chest with 10.9 x 5.6 x 7.2 cm left upper lobe mass with invasion of the mediastinum causing effacement/occlusion of the left upper lobe pulmonary artery, narrowing of the left lower lobe pulmonary artery compatible with bronchogenic carcinoma; no  pulmonary embolism identified.  Medical oncology and pulmonary critical care consulted.  TRH consulted for admission for new finding of left lung mass concerning for bronchogenic carcinoma.   Hospital course:  Lung mass concerning for bronchogenic carcinoma Patient presenting with shortness of breath, worsening dry cough since December 2024.  Outpatient chest x-ray concerning for lung mass and was sent by his PCP for further evaluation.  CT angiogram chest with 10.9 x 5.6 x 7.2 cm left upper lobe mass with invasion of the mediastinum causing effacement/occlusion of the left upper lobe pulmonary artery, narrowing of the left lower lobe pulmonary artery concerning for bronchogenic carcinoma.  Pulmonary critical care medicine was consulted and patient went thoracentesis on 06/06/2023.  Seen by pulmonology, cytology pending at time of discharge; plan outpatient follow-up with diagnostic bronchoscopy with biopsy if cytology inconclusive.  Seen by medical oncology, Dr. Scherrie Curt; outpatient follow-up with medical oncology and will need staging PET scan, brain MRI depending on pathology result.  Vocal cord paralysis Patient was seen by speech therapy underwent FEES with concern for vocal cord paralysis likely secondary to compressive lung mass.  Referral placed to ENT outpatient for follow-up.  Permanent atrial fibrillation Follows with cardiology outpatient.  Continue metoprolol  tartrate 25 mg p.o. twice daily, apixaban  5 mg p.o. twice daily.  Chronic lymphedema, stable Continue furosemide  40 mg p.o. daily.  Obesity, class III Body mass index is 41.2 kg/m.   Discharge Diagnoses:  Principal Problem:   Lung mass Active Problems:   Paroxysmal atrial fibrillation [facility])   Chest pain   Dysphonia    Discharge Instructions  Discharge Instructions     Ambulatory referral to ENT  Complete by: As directed    Vocal cord paralysis likely secondary to lung mass   Ambulatory referral to Hematology /  Oncology   Complete by: As directed    Call MD for:  difficulty breathing, headache or visual disturbances   Complete by: As directed    Call MD for:  extreme fatigue   Complete by: As directed    Call MD for:  persistant dizziness or light-headedness   Complete by: As directed    Call MD for:  persistant nausea and vomiting   Complete by: As directed    Call MD for:  severe uncontrolled pain   Complete by: As directed    Call MD for:  temperature >100.4   Complete by: As directed    Diet - low sodium heart healthy   Complete by: As directed    Increase activity slowly   Complete by: As directed    Pulmonary Visit   Complete by: As directed    Seen by pulmonology while inpatient at [facility].  Needs close follow-up for potential bronchoscopy with biopsy/EBUS   Reason for referral: Lung Mass/Lung Nodule   Does the patient's CT scan have any urgent finds? ("lung mass" (3+cm), suspected "metastatic disease," nodules >33mm with high-risk characteristics such as "spiculation, pleural tenting, adenopathy," or non-calcified nodule >35mm): Yes      Allergies as of 06/07/2023   No Known Allergies      Medication List     STOP taking these medications    amoxicillin-clavulanate 500-125 MG tablet Commonly known as: AUGMENTIN       TAKE these medications    acetaminophen  500 MG tablet Commonly known as: TYLENOL  Take 500 mg by mouth every 6 (six) hours as needed for mild pain.   allopurinol 100 MG tablet Commonly known as: ZYLOPRIM Take 100 mg by mouth daily.   apixaban  5 MG Tabs tablet Commonly known as: ELIQUIS  Take 1 tablet (5 mg total) by mouth 2 (two) times daily.   furosemide  40 MG tablet Commonly known as: LASIX  Take 1 tablet (40 mg total) by mouth daily.   metoprolol  tartrate 25 MG tablet Commonly known as: LOPRESSOR  Take 1 tablet (25 mg total) by mouth 2 (two) times daily.   VITAMIN B 12 PO Take 1 tablet by mouth daily.   Vitamin D  125 MCG (5000 UT)  Caps Take 5,000 Units by mouth daily.        Follow-up Information     Gwyndolyn Lerner, PA-C. Schedule an appointment as soon as possible for a visit in 1 week(s).   Specialty: Family Medicine Contact information: [address] [place] [address] [phone]         [facility] at [facility]. Schedule an appointment as soon as possible for a visit.   Specialty: Pulmonology Contact information: [address] [place] [address] [place]  [address] [phone] Additional information: [address]  [address]  [place], [place] [address]        [facility] - A Dept Of [place]. [facility]. Schedule an appointment as soon as possible for a visit.   Specialty: Oncology Contact information: [address] [place]  [address] [phone]        Artice Last, MD. Schedule an appointment as soon as possible for a visit.   Specialty: Otolaryngology Why: Vocal cord dysfunction Contact information: [address] [place] [address] [phone]  No Known Allergies  Consultations: Pulmonology, Dr. Baldwin Levee, Dr. Olena Bernard Medical oncology, Dr. Scherrie Curt   Procedures/Studies: DG Chest 2 View Result Date: 06/06/2023 CLINICAL DATA:  Status post thoracentesis EXAM: CHEST - 2 VIEW COMPARISON:  Jun 01, 2023 FINDINGS: Post thoracentesis without evidence of pneumothorax No change in the left upper lobe mass lesion compared with prior chest x-ray and prior CT Jun 05, 2023. Right lung clear Heart normal size IMPRESSION: Post thoracentesis without evidence of pneumothorax Electronically Signed   By: Fredrich Jefferson M.D.   On: 06/06/2023 15:59   CT Angio Chest PE W/Cm &/Or Wo Cm Result Date: 06/05/2023 CLINICAL DATA:  Shortness of breath.  Left upper lobe mass. * Tracking Code: BO * EXAM: CT ANGIOGRAPHY CHEST WITH CONTRAST TECHNIQUE: Multidetector CT imaging of the  chest was performed using the standard protocol during bolus administration of intravenous contrast. Multiplanar CT image reconstructions and MIPs were obtained to evaluate the vascular anatomy. RADIATION DOSE REDUCTION: This exam was performed according to the departmental dose-optimization program which includes automated exposure control, adjustment of the mA and/or kV according to patient size and/or use of iterative reconstruction technique. CONTRAST:  75mL OMNIPAQUE  IOHEXOL  350 MG/ML SOLN COMPARISON:  Chest radiograph 06/01/2023 FINDINGS: Despite efforts by the technologist and patient, motion artifact is present on today's exam and could not be eliminated. This reduces exam sensitivity and specificity. Cardiovascular: Left upper lobe branches of the left pulmonary artery are effaced or occluded due to a 10.9 by 5.6 by 7.2 cm left upper lobe mass which invades the aorta pulmonary window and mediastinum. This also causes narrowing of the left lower lobe pulmonary artery and pulmonary artery invasion by tumor is not completely excluded. Mass abuts the left lateral pleural margin and obviously extends across the mediastinal pleural margin. There is a moderate left pleural effusion malignant effusion is a distinct possibility. Separate from this mass effect causing narrowing and occlusion of portions of the left upper lobe pulmonary artery, I do not see a separate pulmonary embolus. Lower lobe pulmonary arteries are somewhat obscured by motion artifact. Coronary, aortic arch, and branch vessel atherosclerotic vascular disease. Moderate cardiomegaly. Mediastinum/Nodes: 1.1 cm right paratracheal lymph node on image 41 series 5. Additional clustered right lower paratracheal lymph nodes are present. The dominant left upper lobe mass invades the mediastinum in the vicinity of the hilum and AP window. Along the posterior mediastinum, there are hypodense nodules in the paraspinal location especially along the right,  query extramedullary hematopoiesis. Lungs/Pleura: Occlusion of the left superior lobar bronchus by the large left upper lobe mass. The lingular bronchus is narrowed but not occluded. Patchy ground-glass opacities in the remainder of the left upper lobe and left lower lobe with passive atelectasis of much of the left lower lobe. Hazy ground-glass density in the right lung is present as well, raising the possibility of pulmonary edema. Upper Abdomen: 7.1 by 4.7 cm right adrenal mass with some fatty elements favoring myelolipoma. Musculoskeletal: Thoracic spondylosis. Lucent lesion along the right scapular neck is probably benign. Review of the MIP images confirms the above findings. IMPRESSION: 1. 10.9 by 5.6 by 7.2 cm left upper lobe mass which invades the mediastinum in the vicinity of the hilum and AP window. This causes effacement or occlusion of portions of the left upper lobe pulmonary artery and narrowing of the left lower lobe pulmonary artery. Pulmonary artery invasion by tumor is not completely excluded. The mass is compatible with bronchogenic carcinoma. Pulmonology and oncology consultations may be indicated for tissue  sampling further workup. 2. Moderate left pleural effusion, malignant effusion is a distinct possibility. 3. No obvious filling defect in the visible pulmonary arterial tree to indicate typical pulmonary embolus, with the understanding that part of the left upper lobe pulmonary arterial tree is occluded by the mass, and portions of the lower lobe pulmonary arterial tree bilaterally are obscured by motion artifact. 4. Patchy ground-glass opacities in the remainder of the left upper lobe and left lower lobe with passive atelectasis of much of the left lower lobe. Hazy ground-glass density in the right lung is present as well, raising the possibility of pulmonary edema. 5. Moderate cardiomegaly. 6. Coronary, aortic arch, and branch vessel atherosclerotic vascular disease. Aortic Atherosclerosis  (ICD10-I70.0). 7. 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 8. Lucent lesion along the right scapular neck is probably benign. Electronically Signed   By: Freida Jes M.D.   On: 06/05/2023 17:44   DG Chest 2 View Result Date: 06/01/2023 CLINICAL DATA:  Cough for several weeks EXAM: CHEST - 2 VIEW COMPARISON:  04/21/2020 FINDINGS: Frontal and lateral views of the chest demonstrate an enlarged cardiac silhouette. There is a large masslike area of consolidation within the left upper lobe along the major fissure, measuring up to 9.1 cm in size. This could reflect pneumonia in the appropriate clinical setting, and close follow-up after medical management is recommended to document resolution and exclude underlying mass. Increased density at the left hilum may reflect adenopathy. Small left pleural effusion. Right chest is clear. No acute bony abnormalities. IMPRESSION: 1. Large area of masslike consolidation within the left upper lobe, with associated left hilar prominence suggesting underlying adenopathy. While pneumonia could give this appearance, the radiographic pattern is somewhat concerning for underlying lung mass. If the patient has current signs or symptoms of infection, close imaging follow-up after appropriate medical management is recommended to document resolution. If there are no current signs of infection, CT chest with IV contrast is recommended to assess for underlying neoplasm. 2. Small left pleural effusion. These results will be called to the ordering clinician or representative by the Radiologist Assistant, and communication documented in the PACS or [employer]. Electronically Signed   By: Bobbye Burrow M.D.   On: 06/01/2023 15:17     Subjective: Patient seen examined bedside, sitting at edge of bed.  Family present.  Undergoing FEES by speech therapy this morning.  Discussed with patient if he wanted to remain in the hospital waiting for  cytology results or follow-up outpatient; patient wishes to follow-up outpatient at this time.  Continues with intermittent dry cough, hoarseness of voice.  No other specific complaints or concerns at this time.  Denies headache, no dizziness, no chest pain, no palpitations, no current shortness of breath, no abdominal pain, no fever, no focal weakness, no fatigue, no paresthesias.  Discharge Exam: Vitals:   06/07/23 0445 06/07/23 0738  BP: 108/62 111/72  Pulse: 91 82  Resp: 16 16  Temp: 97.6 F (36.4 C) 98 F (36.7 C)  SpO2: 95% 98%   Vitals:   06/06/23 2106 06/07/23 0020 06/07/23 0445 06/07/23 0738  BP: 113/65 120/61 108/62 111/72  Pulse: 89 70 91 82  Resp: 19 16 16 16   Temp: 97.6 F (36.4 C) 97.6 F (36.4 C) 97.6 F (36.4 C) 98 F (36.7 C)  TempSrc: Oral Oral Oral Oral  SpO2: 100% 98% 95% 98%  Weight:      Height:  Physical Exam: GEN: NAD, alert and oriented x 3, obese HEENT: NCAT, PERRL, EOMI, sclera clear, MMM PULM: CTAB w/o wheezes/crackles, normal respiratory effort, on room air CV: RRR w/o M/G/R GI: abd soft, NTND, NABS, no R/G/M MSK: Noted chronic peripheral lymphedema    The results of significant diagnostics from this hospitalization (including imaging, microbiology, ancillary and laboratory) are listed below for reference.     Microbiology: Recent Results (from the past 240 hours)  Body fluid culture w Gram Stain     Status: None (Preliminary result)   Collection Time: 06/06/23  2:10 PM   Specimen: Pleura; Body Fluid  Result Value Ref Range Status   Specimen Description PLEURAL LEFT LUNG  Final   Special Requests NONE  Final   Gram Stain   Final    RARE WBC PRESENT, PREDOMINANTLY MONONUCLEAR NO ORGANISMS SEEN    Culture   Final    NO GROWTH < 24 HOURS Performed at [facility], [address] [place], [place] [address]    Report Status PENDING  Incomplete     Labs: BNP (last 3 results) Recent Labs    06/01/23 1439  06/05/23 1330  BNP 306.2* 166.2*   Basic Metabolic Panel: Recent Labs  Lab 06/01/23 1439 06/05/23 1330 06/06/23 0638 06/07/23 0637  NA 137 136 137 136  K 4.2 3.7 4.4 3.8  CL 102 101 103 102  CO2 23 25 27 25   GLUCOSE 85 136* 108* 113*  BUN 9 6* 6* 9  CREATININE 0.73* 0.80 0.72 0.83  CALCIUM  8.4* 8.8* 8.5* 8.7*  MG  --   --   --  1.9   Liver Function Tests: Recent Labs  Lab 06/01/23 1439 06/05/23 1330 06/07/23 0637  AST 12 17 12*  ALT 9 9 11   ALKPHOS 73 54 50  BILITOT 0.5 0.7 0.6  PROT 6.2 6.5 6.4*  ALBUMIN 3.0* 2.7* 2.6*   No results for input(s): "LIPASE", "AMYLASE" in the last 168 hours. No results for input(s): "AMMONIA" in the last 168 hours. CBC: Recent Labs  Lab 06/01/23 1439 06/05/23 1330 06/06/23 0638 06/07/23 0637  WBC 6.9 6.9 6.5 7.0  NEUTROABS 5.0  --   --   --   HGB 11.4* 12.2* 12.2* 12.3*  HCT 36.9* 39.9 39.0 38.8*  MCV 84 84.0 82.6 82.2  PLT 231 251 260 249   Cardiac Enzymes: No results for input(s): "CKTOTAL", "CKMB", "CKMBINDEX", "TROPONINI" in the last 168 hours. BNP: Invalid input(s): "POCBNP" CBG: No results for input(s): "GLUCAP" in the last 168 hours. D-Dimer No results for input(s): "DDIMER" in the last 72 hours. Hgb A1c No results for input(s): "HGBA1C" in the last 72 hours. Lipid Profile No results for input(s): "CHOL", "HDL", "LDLCALC", "TRIG", "CHOLHDL", "LDLDIRECT" in the last 72 hours. Thyroid  function studies No results for input(s): "TSH", "T4TOTAL", "T3FREE", "THYROIDAB" in the last 72 hours.  Invalid input(s): "FREET3" Anemia work up No results for input(s): "VITAMINB12", "FOLATE", "FERRITIN", "TIBC", "IRON ", "RETICCTPCT" in the last 72 hours. Urinalysis No results found for: "COLORURINE", "APPEARANCEUR", "LABSPEC", "PHURINE", "GLUCOSEU", "HGBUR", "BILIRUBINUR", "KETONESUR", "PROTEINUR", "UROBILINOGEN", "NITRITE", "LEUKOCYTESUR" Sepsis Labs Recent Labs  Lab 06/01/23 1439 06/05/23 1330 06/06/23 0638  06/07/23 0637  WBC 6.9 6.9 6.5 7.0   Microbiology Recent Results (from the past 240 hours)  Body fluid culture w Gram Stain     Status: None (Preliminary result)   Collection Time: 06/06/23  2:10 PM   Specimen: Pleura; Body Fluid  Result Value Ref Range Status  Specimen Description PLEURAL LEFT LUNG  Final   Special Requests NONE  Final   Gram Stain   Final    RARE WBC PRESENT, PREDOMINANTLY MONONUCLEAR NO ORGANISMS SEEN    Culture   Final    NO GROWTH < 24 HOURS Performed at [facility], [address] [place], [place] [address]    Report Status PENDING  Incomplete     Time coordinating discharge: Over 30 minutes  SIGNED:   Rema Care [place], DO  [facility] 06/07/2023, 11:37 AM

## 2023-06-07 NOTE — Progress Notes (Addendum)
 Raymond Hurst

## 2023-06-07 NOTE — Plan of Care (Signed)
  Problem: Education: Goal: Knowledge of General Education information will improve Description: Including pain rating scale, medication(s)/side effects and non-pharmacologic comfort measures 06/07/2023 1317 by Tiana Flurry, RN Outcome: Adequate for Discharge 06/07/2023 1316 by Tiana Flurry, RN Outcome: Adequate for Discharge   Problem: Health Behavior/Discharge Planning: Goal: Ability to manage health-related needs will improve 06/07/2023 1317 by Tiana Flurry, RN Outcome: Adequate for Discharge 06/07/2023 1316 by Tiana Flurry, RN Outcome: Adequate for Discharge   Problem: Clinical Measurements: Goal: Ability to maintain clinical measurements within normal limits will improve 06/07/2023 1317 by Tiana Flurry, RN Outcome: Adequate for Discharge 06/07/2023 1316 by Tiana Flurry, RN Outcome: Adequate for Discharge Goal: Will remain free from infection 06/07/2023 1317 by Tiana Flurry, RN Outcome: Adequate for Discharge 06/07/2023 1316 by Tiana Flurry, RN Outcome: Adequate for Discharge Goal: Diagnostic test results will improve 06/07/2023 1317 by Tiana Flurry, RN Outcome: Adequate for Discharge 06/07/2023 1316 by Tiana Flurry, RN Outcome: Adequate for Discharge Goal: Respiratory complications will improve 06/07/2023 1317 by Tiana Flurry, RN Outcome: Adequate for Discharge 06/07/2023 1316 by Tiana Flurry, RN Outcome: Adequate for Discharge Goal: Cardiovascular complication will be avoided 06/07/2023 1317 by Tiana Flurry, RN Outcome: Adequate for Discharge 06/07/2023 1316 by Tiana Flurry, RN Outcome: Adequate for Discharge   Problem: Activity: Goal: Risk for activity intolerance will decrease 06/07/2023 1317 by Tiana Flurry, RN Outcome: Adequate for Discharge 06/07/2023 1316 by Tiana Flurry, RN Outcome: Adequate for Discharge   Problem: Nutrition: Goal: Adequate nutrition will be maintained 06/07/2023 1317 by Tiana Flurry, RN Outcome:  Adequate for Discharge 06/07/2023 1316 by Tiana Flurry, RN Outcome: Adequate for Discharge   Problem: Coping: Goal: Level of anxiety will decrease 06/07/2023 1317 by Tiana Flurry, RN Outcome: Adequate for Discharge 06/07/2023 1316 by Tiana Flurry, RN Outcome: Adequate for Discharge   Problem: Elimination: Goal: Will not experience complications related to bowel motility 06/07/2023 1317 by Tiana Flurry, RN Outcome: Adequate for Discharge 06/07/2023 1316 by Tiana Flurry, RN Outcome: Adequate for Discharge Goal: Will not experience complications related to urinary retention 06/07/2023 1317 by Tiana Flurry, RN Outcome: Adequate for Discharge 06/07/2023 1316 by Tiana Flurry, RN Outcome: Adequate for Discharge   Problem: Pain Managment: Goal: General experience of comfort will improve and/or be controlled 06/07/2023 1317 by Tiana Flurry, RN Outcome: Adequate for Discharge 06/07/2023 1316 by Tiana Flurry, RN Outcome: Adequate for Discharge   Problem: Safety: Goal: Ability to remain free from injury will improve 06/07/2023 1317 by Tiana Flurry, RN Outcome: Adequate for Discharge 06/07/2023 1316 by Tiana Flurry, RN Outcome: Adequate for Discharge   Problem: Skin Integrity: Goal: Risk for impaired skin integrity will decrease 06/07/2023 1317 by Tiana Flurry, RN Outcome: Adequate for Discharge 06/07/2023 1316 by Tiana Flurry, RN Outcome: Adequate for Discharge

## 2023-06-07 NOTE — TOC Transition Note (Signed)
 Transition of Care [facility]) - Discharge Note   Patient Details  Name: Raymond Hurst MRN: [id number] Date of Birth: 1950-12-04  [facility]) CM/SW Contact:  Jennett Model, RN Phone Number: 06/07/2023, 11:55 AM   Clinical Narrative:    For dc today, has no needs. Wife will transport home.         Patient Goals and CMS Choice            Discharge Placement                       Discharge Plan and Services Additional resources added to the After Visit Summary for                                       Social Drivers of Health (SDOH) Interventions SDOH Screenings   Food Insecurity: No Food Insecurity (06/05/2023)  Housing: Low Risk  (06/05/2023)  Transportation Needs: No Transportation Needs (06/05/2023)  Utilities: Not At Risk (06/05/2023)  Social Connections: Unknown (06/05/2023)  Tobacco Use: Medium Risk (06/05/2023)     Readmission Risk Interventions     No data to display

## 2023-06-07 NOTE — TOC CM/SW Note (Signed)
 Transition of Care [facility]) - Inpatient Brief Assessment   Patient Details  Name: VIVIANO FLADGER MRN: [id number] Date of Birth: 1950-06-25  Transition of Care [facility]) CM/SW Contact:    Jennett Model, RN Phone Number: 06/07/2023, 11:53 AM   Clinical Narrative: From home with spouse, has PCP, Dr. Aisha Ali at [place] in [place] and insurance on file, states has no [facility] services in place at this time , has cpap and vascular pump for legs at home.  States family member will transport them home at [employer] and family is support system, states gets medications from [facility] in [place].  Pta self ambulatory .  Patient gives this NCM permission to speak with wife.   Transition of Care Asessment: Insurance and Status: Insurance coverage has been reviewed Patient has primary care physician: Yes Aisha Ali in [place] at [place]) Home environment has been reviewed: lives with wife Prior level of function:: indep Prior/Current Home Services: No current home services Social Drivers of Health Review: SDOH reviewed no interventions necessary Readmission risk has been reviewed: Yes Transition of care needs: no transition of care needs at this time

## 2023-06-07 NOTE — Procedures (Signed)
 Objective Swallowing Evaluation: Type of Study: FEES-Fiberoptic Endoscopic Evaluation of Swallow   Patient Details  Name: Raymond Hurst MRN: [id number] Date of Birth: [birth year]  Today's Date: 06/07/2023 Time: SLP Start Time (ACUTE ONLY): 1031 -SLP Stop Time (ACUTE ONLY): 1059  SLP Time Calculation (min) (ACUTE ONLY): 28 min   Past Medical History:  Past Medical History:  Diagnosis Date   Atrial fibrillation (HCC)    Cellulitis of right lower leg    History of TIA (transient ischemic attack)    May 2020   Lymphedema    OSA on CPAP    severe obstructive sleep apnea with an AHI of 54/h with no significant central events.  He had nocturnal hypoxemia with O2 sat nadir of 70%. Now on CPAP   Past Surgical History:  Past Surgical History:  Procedure Laterality Date   INNER EAR SURGERY     ORTHOPEDIC SURGERY     HPI: 73 yo male presenting 5/5 with persistent cough, dyspnea, and hoarseness. Found to have L hilar mass. Per MD note, concern for RLN palsy as source of dysphonia in setting of mass. Pt says that he has noticed some reduced range over the last few months (is a backup singer in a band), but that his voice became acutely hoarse ~10 days PTA. PMH includes: afib, lymphedemaTIA, OSA on CPAP   Subjective: Awake, alert, pleasant, participative    Recommendations for follow up therapy are one component of a multi-disciplinary discharge planning process, led by the attending physician.  Recommendations may be updated based on patient status, additional functional criteria and insurance authorization.  Assessment / Plan / Recommendation     06/07/2023   11:43 AM  Clinical Impressions  Clinical Impression Pt presents with grossly normal swallow function.  There was no penetration or aspiration of any consistencies trialed, and there was minimal pharyngeal residue.  OF NOTE: Pt's left vocal fold is entirely immobile.  This may be due to RLN palsy from compression from mass.  There  appears to be some extra flaccid tissue at apex of L arytenoid which flops into the laryngeal vestibule at times partially obscuring view of vocal cords.  Pt would benefit from ENT involvement in care. 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  Discussed with pt slow rate, small sips.  He reports some coughing with drinking, but none observed today.  Encourage use of straw as this was the condition for his FEES and no penetration was observed during this study.  Pt would benefit from ENT/SLP OP follow up.     Recommend continuing regular diet with thin liquid.    SLP Visit Diagnosis Dysphagia, unspecified (R13.10)  Attention and concentration deficit following --  Frontal lobe and executive function deficit following --  Impact on safety and function Mild aspiration risk         06/07/2023   11:43 AM  Treatment Recommendations  Treatment Recommendations Therapy as outlined in treatment plan below         No data to display             06/07/2023   11:43 AM  Diet Recommendations  SLP Diet Recommendations Regular solids;Thin liquid  Liquid Administration via Straw  Medication Administration --  Compensations Slow rate;Small sips/bites  Postural Changes Seated upright at 90 degrees         06/07/2023   11:43 AM  Other Recommendations  Recommended Consults Consider ENT evaluation  Oral Care Recommendations Oral care BID  Caregiver Recommendations --  Follow Up Recommendations Outpatient SLP  Assistance recommended at discharge --  Functional Status Assessment Patient has had a recent decline in their functional status and demonstrates the ability to make significant improvements in function in a reasonable and predictable amount of time.       06/07/2023   11:43 AM  Frequency and Duration   Speech Therapy Frequency (ACUTE ONLY) min 2x/week  Treatment Duration 2 weeks          06/07/2023   11:42 AM  Oral Phase  Oral Phase WFL  Oral - Pudding Teaspoon --  Oral - Pudding Cup --  Oral - Honey Teaspoon --  Oral - Honey Cup --  Oral - Nectar Teaspoon --  Oral - Nectar Cup --  Oral - Nectar Straw --  Oral - Thin Teaspoon --  Oral - Thin Cup --  Oral - Thin Straw --  Oral - Puree --  Oral - Mech Soft --  Oral - Regular --  Oral - Multi-Consistency --  Oral - Pill --  Oral Phase - Comment --       06/07/2023   11:42 AM  Pharyngeal Phase  Pharyngeal Phase Impaired  Pharyngeal- Pudding Teaspoon --  Pharyngeal --  Pharyngeal- Pudding Cup --  Pharyngeal --  Pharyngeal- Honey Teaspoon --  Pharyngeal --  Pharyngeal- Honey Cup --  Pharyngeal --  Pharyngeal- Nectar Teaspoon --  Pharyngeal --  Pharyngeal- Nectar Cup --  Pharyngeal --  Pharyngeal- Nectar Straw --  Pharyngeal --  Pharyngeal- Thin Teaspoon --  Pharyngeal --  Pharyngeal- Thin Cup --  Pharyngeal --  Pharyngeal- Thin Straw WFL  Pharyngeal Material does not enter airway  Pharyngeal- Puree WFL  Pharyngeal Material does not enter airway  Pharyngeal- Mechanical Soft --  Pharyngeal --  Pharyngeal- Regular WFL  Pharyngeal Material does not enter airway  Pharyngeal- Multi-consistency --  Pharyngeal --  Pharyngeal- Pill --  Pharyngeal --  Pharyngeal Comment --        06/07/2023   11:43 AM  Cervical Esophageal Phase   Cervical Esophageal Phase WFL  Pudding Teaspoon --  Pudding Cup --  Honey Teaspoon --  Honey Cup --  Nectar Teaspoon --  Nectar Cup --  Nectar Straw --  Thin Teaspoon --  Thin Cup --  Thin Straw --  Puree --  Mechanical Soft --  Regular --  Multi-consistency --  Pill --  Cervical Esophageal Comment --     Elester Grim, MA, CCC-SLP Acute Rehabilitation Services Office: [phone] 06/07/2023, 11:52 AM

## 2023-06-07 NOTE — Progress Notes (Signed)
 Mobility Specialist Progress Note:    06/07/23 1240  Mobility  Activity Ambulated with assistance in hallway  Level of Assistance Standby assist, set-up cues, supervision of patient - no hands on  Assistive Device None  Distance Ambulated (ft) 550 ft  Activity Response Tolerated well  Mobility Referral Yes  Mobility visit 1 Mobility  Mobility Specialist Start Time (ACUTE ONLY) 1230  Mobility Specialist Stop Time (ACUTE ONLY) 1240  Mobility Specialist Time Calculation (min) (ACUTE ONLY) 10 min   Received pt in bed having no complaints and agreeable to mobility. Pt was asymptomatic throughout ambulation and returned to room w/o fault. Left seated EOB w/ call bell in reach and all needs met.   Inetta Manes Mobility Specialist  Please contact vis Secure Chat or  Rehab Office [phone]

## 2023-06-08 ENCOUNTER — Encounter (HOSPITAL_COMMUNITY): Payer: Self-pay | Admitting: Pulmonary Disease

## 2023-06-08 ENCOUNTER — Encounter: Payer: Self-pay | Admitting: Emergency Medicine

## 2023-06-08 ENCOUNTER — Telehealth: Payer: Self-pay | Admitting: *Deleted

## 2023-06-08 ENCOUNTER — Telehealth: Payer: Self-pay | Admitting: Emergency Medicine

## 2023-06-08 DIAGNOSIS — R918 Other nonspecific abnormal finding of lung field: Secondary | ICD-10-CM

## 2023-06-08 NOTE — Telephone Encounter (Signed)
 No answer, multiple attempts, will continue work on reaching him.

## 2023-06-08 NOTE — Telephone Encounter (Signed)
 I was able to speak with the patient and his wife by phone today.  Explained the negative pleural fluid cytology results and that I have recommended bronchoscopy with EBUS.  They understand and agree to proceed.  I will try to get this set up for 06/12/2023.  He will come off his Eliquis  2 days prior

## 2023-06-08 NOTE — Telephone Encounter (Signed)
 Called the patient to review his pleural fluid results, plan bronchoscopy.  No answer.  Left him a voicemail.  Will have to try him back.

## 2023-06-08 NOTE — Telephone Encounter (Signed)
 Called the patient again, no answer.  Will continue to try.  Also left a voicemail with his spouse.

## 2023-06-08 NOTE — Telephone Encounter (Signed)
 PT's wife calling Dr. back. States he can not speak that is why he can not answ the phone. Please try @ [phone]

## 2023-06-08 NOTE — Telephone Encounter (Signed)
 Voicemail left for callback to discuss appt

## 2023-06-08 NOTE — Telephone Encounter (Signed)
 Cytology negative on pleural fluid. Need to arrange bronchoscopy

## 2023-06-09 ENCOUNTER — Encounter (HOSPITAL_COMMUNITY): Payer: Self-pay | Admitting: Emergency Medicine

## 2023-06-09 ENCOUNTER — Other Ambulatory Visit: Payer: Self-pay

## 2023-06-09 ENCOUNTER — Encounter: Payer: Self-pay | Admitting: *Deleted

## 2023-06-09 LAB — BODY FLUID CULTURE W GRAM STAIN: Culture: NO GROWTH

## 2023-06-09 NOTE — Progress Notes (Signed)
 Cytology from pleural fluid came back negative for malignancy

## 2023-06-09 NOTE — Progress Notes (Signed)
 Anesthesia Chart Review: Raymond Hurst  Case: [id number] Date/Time: 06/12/23 0915   Procedure: BRONCHOSCOPY, WITH EBUS (Left)   Anesthesia type: General   Diagnosis: Lung mass [R91.8]   Pre-op diagnosis: Left hilar mass   [facility]: [facility] / [facility]   Surgeons: Denson Flake, MD       DISCUSSION: Patient is a 73 year old male scheduled for the above procedure.  History includes former smoker (quit 1992), TIA (2020), permanent afib, leg edema/lymphedema, severe OSA (uses CPAP), right adrenal tumor, LUL lung mass  Alcohol use is documented as 4-5 beers daily.  Last office visit with cardiology was on 05/31/23 with [name], NP for 1 year follow-up. He reported larygnitis with productive cough for nearly 6 months and had lost 30 lbs unintentionally but with worsening LE lymphedema, R>L. He reported a recent study that ruled out DVT (report not currently available). Wife inquired about chest imaging and echocardiogram (scheduled for 06/20/23), which NP did order. She also increased Lasix  from 20 mg to 40 mg daily.  CXR on 06/01/23 showed a large mass consolidation within the LUL associated with left hilar prominence suggesting underlying adenopathy. She called the results, started him on Augmentin and referred him to pulmonology, but prior to that visit he presented to the ED on 06/05/23 with worsening dyspnea. CTA chest showed a nearly 11 cm LUL mass invading the mediastinum and causing effacement or occlusion of portions of the LUL pulmonary artery concerning for bronchogenic carcinoma.  There was also moderate left pleural effusion.  No obvious filling defects to indicate typical PE.  There is also a known 7 cm right adrenal mass that is favored to be a myelolipoma. Oncology and pulmonology were consulted. Eliquis  was placed on hold. He underwent left thorocentesis (300 cc serosanguinous fluid) on 06/06/23 with plans for out-patient diagnostic bronchoscopy if pleural fluid  negative. ENT referral also planned for "vocal cord paralysis likely secondary to lung mass." Pleural fluid cytology did not find any malignant cells so above procedure planned with outpatient oncology follow-up to determine management/treatment options following tissue diagnosis.  He has also been evaluated by Dr. Melton Squires (08/18/22) and endocrinologist Dr. Kathyanne Parkers for a right adrenal tumor, favored to be a myelolipoma. Endocrinology records and any adrenal labs requested. By 04/18/23 CT adrenal tumor was stable.  Dr. Baldwin Levee advised holding Eliquis  for 2 days prior to procedure.   He is a same-day workup, so anesthesia team to evaluate on the day of surgery.  He did have a CMP and CBC done on 06/07/2023 showing H/H 12.3/38.8, PLT 249, glucose 113, Cr 0.83, AST 12, ALT 11. Endocrinology records are still pending.   VS:  Wt Readings from Last 3 Encounters:  06/05/23 (!) 137.8 kg  05/31/23 (!) 137.8 kg  01/30/23 (!) 153.3 kg   BP Readings from Last 3 Encounters:  06/07/23 111/72  05/31/23 120/62  08/15/22 124/72   Pulse Readings from Last 3 Encounters:  06/07/23 82  05/31/23 73  08/15/22 98     PROVIDERS: Gwyndolyn Lerner, PA-C is PCP Racheal Buddle, MD is pulmonologist Teddie Favre, MD is cardiologist Gordy Lauber, MD is endocrinologist Shela Derby, MD is general surgeon. Referred him to endocrinology for right adrenal mass and    LABS: Most recent lab results include: Lab Results  Component Value Date   WBC 7.0 06/07/2023   HGB 12.3 (L) 06/07/2023   HCT 38.8 (L) 06/07/2023   PLT 249 06/07/2023   GLUCOSE 113 (H) 06/07/2023  CHOL 129 06/08/2018   TRIG 84 06/08/2018   HDL 31 (L) 06/08/2018   LDLCALC 81 06/08/2018   ALT 11 06/07/2023   AST 12 (L) 06/07/2023   NA 136 06/07/2023   K 3.8 06/07/2023   CL 102 06/07/2023   CREATININE 0.83 06/07/2023   BUN 9 06/07/2023   CO2 25 06/07/2023   TSH 3.53 05/11/2020   INR 1.2 06/06/2023   HGBA1C 5.2 06/08/2018     IMAGES: CXR 06/06/23: FINDINGS: - Post thoracentesis without evidence of pneumothorax - No change in the left upper lobe mass lesion compared with prior chest x-ray and prior CT Jun 05, 2023. - Right lung clear - Heart normal size IMPRESSION: Post thoracentesis without evidence of pneumothorax  CTA Chest 06/05/23: IMPRESSION: 1. 10.9 by 5.6 by 7.2 cm left upper lobe mass which invades the mediastinum in the vicinity of the hilum and AP window. This causes effacement or occlusion of portions of the left upper lobe pulmonary artery and narrowing of the left lower lobe pulmonary artery. Pulmonary artery invasion by tumor is not completely excluded. The mass is compatible with bronchogenic carcinoma. Pulmonology and oncology consultations may be indicated for tissue sampling further workup. 2. Moderate left pleural effusion, malignant effusion is a distinct possibility. 3. No obvious filling defect in the visible pulmonary arterial tree to indicate typical pulmonary embolus, with the understanding that part of the left upper lobe pulmonary arterial tree is occluded by the mass, and portions of the lower lobe pulmonary arterial tree bilaterally are obscured by motion artifact. 4. Patchy ground-glass opacities in the remainder of the left upper lobe and left lower lobe with passive atelectasis of much of the left lower lobe. Hazy ground-glass density in the right lung is present as well, raising the possibility of pulmonary edema. 5. Moderate cardiomegaly. 6. Coronary, aortic arch, and branch vessel atherosclerotic vascular disease. Aortic Atherosclerosis (ICD10-I70.0). 7. 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 8. Lucent lesion along the right scapular neck is probably benign.   CT Abd 04/18/23: IMPRESSION: *Right adrenal gland myelolipoma.  Stable since prior examination. *Bilateral renal cysts. *Aortic atherosclerosis.   EKG:  06/05/23: Atrial fibrillation at 83 bpm Left axis deviation Borderline prolonged QT interval Confirmed by Shyrl Doyne [phone]) on 06/05/2023 3:05:34 PM   CV: Echo 05/06/19: IMPRESSIONS   1. Left ventricular ejection fraction, by estimation, is 55 to 60%. The  left ventricle has normal function. The left ventricle has no regional  wall motion abnormalities. There is mild left ventricular hypertrophy.  Left ventricular diastolic parameters  are indeterminate.   2. Right ventricular systolic function is normal. The right ventricular  size is normal.   3. The mitral valve is normal in structure. Trivial mitral valve  regurgitation. No evidence of mitral stenosis.   4. The aortic valve has an indeterminant number of cusps. Aortic valve  regurgitation is not visualized. No aortic stenosis is present.   5. Aortic dilatation noted. There is mild to moderate dilatation of the  aortic root measuring 43 mm.   6. Indeterminant PASP, inadequate TR jet.   7. The inferior vena cava is normal in size with greater than 50%  respiratory variability, suggesting right atrial pressure of 3 mmHg.   US  Carotid 06/08/18: IMPRESSION: Color duplex indicates minimal heterogeneous plaque, with no hemodynamically significant stenosis by duplex criteria in the extracranial cerebrovascular circulation.    Past Medical History:  Diagnosis Date   Adrenal tumor  right adrenal gland tumor favored to be a myelolipoma   Atrial fibrillation (HCC)    Cellulitis of right lower leg    Dysrhythmia    A-fib   History of TIA (transient ischemic attack)    May 2020   Lymphedema    OSA on CPAP    severe obstructive sleep apnea with an AHI of 54/h with no significant central events.  He had nocturnal hypoxemia with O2 sat nadir of 70%. Now on CPAP   Pulmonary mass    Stroke [facility])     Past Surgical History:  Procedure Laterality Date   INNER EAR SURGERY     KNEE SURGERY Left    THORACENTESIS Left 06/06/2023    Procedure: THORACENTESIS;  Surgeon: Arlina Lair, MD;  Location: [facility] ENDOSCOPY;  Service: Pulmonary;  Laterality: Left;   TONSILLECTOMY      MEDICATIONS: No current facility-administered medications for this encounter.    acetaminophen  (TYLENOL ) 500 MG tablet   allopurinol  (ZYLOPRIM ) 100 MG tablet   apixaban  (ELIQUIS ) 5 MG TABS tablet   Cholecalciferol  (VITAMIN D ) 125 MCG (5000 UT) CAPS   Cyanocobalamin  (VITAMIN B 12 PO)   furosemide  (LASIX ) 40 MG tablet   metoprolol  tartrate (LOPRESSOR ) 25 MG tablet      Ella Gun, PA-C Surgical Short Stay/Anesthesiology [facility] Phone [phone] [facility] Phone [phone] 06/09/2023 1:42 PM

## 2023-06-09 NOTE — Progress Notes (Signed)
 SDW call  Patient was given pre-op instructions over the phone. Patient verbalized understanding of instructions provided.     PCP - Denyce Flank, PA-C Cardiologist - Dr. Gaylyn Keas Pulmonary:    PPM/ICD - Denies Device Orders - na Rep Notified - na   Chest x-ray - 06/06/2023 EKG -  06/05/2023 Stress Test - ECHO - 05/31/2023 Cardiac Cath -   Sleep Study/sleep apnea/CPAP: OSA with CPAP  Non-diabetic  Blood Thinner Instructions: Eliquis , states last dose this morning, 06/09/2023 Aspirin  Instructions:denies   ERAS Protcol - NPO   Anesthesia review: Yes. A-fib, TIA, OSA/CPAP, recent admission 06/05/2023-06/07/2023, on eliquis    Patient denies shortness of breath, fever, cough and chest pain over the phone call  Your procedure is scheduled on Monday Jun 12, 2023  Report to [facility] Main Entrance "A" at    A.M., then check in with the Admitting office.  Call this number if you have problems the morning of surgery:  [phone]   If you have any questions prior to your surgery date call [phone]: Open Monday-Friday 8am-4pm If you experience any cold or flu symptoms such as cough, fever, chills, shortness of breath, etc. between now and your scheduled surgery, please notify us  at the above number    Remember:  Do not eat or drink after midnight the night before your surgery  Take these medicines the morning of surgery with A SIP OF WATER:  Allopurinol , metoprolol   As needed: tylenol   As of today, STOP taking any Aspirin  (unless otherwise instructed by your surgeon) Aleve, Naproxen, Ibuprofen, Motrin, Advil, Goody's, BC's, all herbal medications, fish oil, and all vitamins.

## 2023-06-09 NOTE — Progress Notes (Signed)
 PATIENT NAVIGATOR PROGRESS NOTE  Name: Raymond Hurst Date: 06/09/2023 MRN: [id number]  DOB: 02-03-1950   Reason for visit:  New patient appt  Comments:  Called and spoke with wife [name] and we discussed that patient is having bronchoscopy on Monday with Dr Baldwin Levee  He is scheduled with Diana Forster NP on Thursday May 15 at 2:15 pm. Discussed directions to building and parking as well as provided contact number to call with any questions    Time spent counseling/coordinating care: 30-45 minutes

## 2023-06-09 NOTE — Anesthesia Preprocedure Evaluation (Signed)
 Anesthesia Evaluation  Patient identified by MRN, date of birth, ID band Patient awake    Reviewed: Allergy & Precautions, NPO status , Patient's Chart, lab work & pertinent test results  Airway Mallampati: III  TM Distance: >3 FB Neck ROM: Full    Dental   Pulmonary sleep apnea and Continuous Positive Airway Pressure Ventilation , former smoker Lung mass. Pleural effusion s/p thoracentesis   Pulmonary exam normal        Cardiovascular hypertension, Pt. on home beta blockers and Pt. on medications + DOE  + dysrhythmias Atrial Fibrillation  Rhythm:Regular Rate:Normal     Neuro/Psych TIACVA    GI/Hepatic negative GI ROS, Neg liver ROS,,,  Endo/Other  Adrenal mass favored to be myelolipoma  Renal/GU negative Renal ROS     Musculoskeletal   Abdominal   Peds  Hematology  (+) Blood dyscrasia, anemia   Anesthesia Other Findings   Reproductive/Obstetrics                             Anesthesia Physical Anesthesia Plan  ASA: 3  Anesthesia Plan: General   Post-op Pain Management: Tylenol  PO (pre-op)*   Induction: Intravenous  PONV Risk Score and Plan: 1 and Ondansetron , Propofol infusion, Dexamethasone  and TIVA  Airway Management Planned: Oral ETT  Additional Equipment:   Intra-op Plan:   Post-operative Plan: Extubation in OR  Informed Consent: I have reviewed the patients History and Physical, chart, labs and discussed the procedure including the risks, benefits and alternatives for the proposed anesthesia with the patient or authorized representative who has indicated his/her understanding and acceptance.     Dental advisory given  Plan Discussed with: CRNA  Anesthesia Plan Comments: (PAT note written 06/09/2023 by Nasier Thumm, PA-C.  )       Anesthesia Quick Evaluation

## 2023-06-12 ENCOUNTER — Encounter (HOSPITAL_COMMUNITY): Payer: Self-pay | Admitting: Emergency Medicine

## 2023-06-12 ENCOUNTER — Ambulatory Visit (HOSPITAL_COMMUNITY)
Admission: RE | Admit: 2023-06-12 | Discharge: 2023-06-12 | Disposition: A | Discharge status: Home / Self Care | Attending: Emergency Medicine | Admitting: Emergency Medicine

## 2023-06-12 ENCOUNTER — Other Ambulatory Visit: Payer: Self-pay

## 2023-06-12 ENCOUNTER — Ambulatory Visit (HOSPITAL_COMMUNITY): Payer: Self-pay | Admitting: Vascular Surgery

## 2023-06-12 ENCOUNTER — Telehealth: Payer: Self-pay | Admitting: *Deleted

## 2023-06-12 ENCOUNTER — Encounter (HOSPITAL_COMMUNITY)
Admission: RE | Disposition: A | Discharge status: Home / Self Care | Payer: Self-pay | Source: Home / Self Care | Attending: Emergency Medicine

## 2023-06-12 DIAGNOSIS — I4821 Permanent atrial fibrillation: Secondary | ICD-10-CM | POA: Diagnosis not present

## 2023-06-12 DIAGNOSIS — R590 Localized enlarged lymph nodes: Secondary | ICD-10-CM | POA: Diagnosis not present

## 2023-06-12 DIAGNOSIS — Z87891 Personal history of nicotine dependence: Secondary | ICD-10-CM | POA: Diagnosis not present

## 2023-06-12 DIAGNOSIS — R918 Other nonspecific abnormal finding of lung field: Secondary | ICD-10-CM

## 2023-06-12 DIAGNOSIS — Z7901 Long term (current) use of anticoagulants: Secondary | ICD-10-CM | POA: Diagnosis not present

## 2023-06-12 DIAGNOSIS — G4733 Obstructive sleep apnea (adult) (pediatric): Secondary | ICD-10-CM | POA: Insufficient documentation

## 2023-06-12 DIAGNOSIS — C3412 Malignant neoplasm of upper lobe, left bronchus or lung: Secondary | ICD-10-CM | POA: Diagnosis not present

## 2023-06-12 DIAGNOSIS — I4891 Unspecified atrial fibrillation: Secondary | ICD-10-CM | POA: Diagnosis not present

## 2023-06-12 DIAGNOSIS — I10 Essential (primary) hypertension: Secondary | ICD-10-CM

## 2023-06-12 DIAGNOSIS — Z8673 Personal history of transient ischemic attack (TIA), and cerebral infarction without residual deficits: Secondary | ICD-10-CM | POA: Insufficient documentation

## 2023-06-12 DIAGNOSIS — C771 Secondary and unspecified malignant neoplasm of intrathoracic lymph nodes: Secondary | ICD-10-CM | POA: Diagnosis not present

## 2023-06-12 HISTORY — PX: BRONCHIAL BRUSHINGS: SHX5108

## 2023-06-12 HISTORY — PX: BRONCHIAL BIOPSY: SHX5109

## 2023-06-12 HISTORY — DX: Cardiac arrhythmia, unspecified: I49.9

## 2023-06-12 HISTORY — PX: HEMOSTASIS CLIP PLACEMENT: SHX6857

## 2023-06-12 HISTORY — DX: Cerebral infarction, unspecified: I63.9

## 2023-06-12 HISTORY — PX: BRONCHIAL NEEDLE ASPIRATION BIOPSY: SHX5106

## 2023-06-12 HISTORY — PX: VIDEO BRONCHOSCOPY WITH ENDOBRONCHIAL ULTRASOUND: SHX6177

## 2023-06-12 HISTORY — DX: Other nonspecific abnormal finding of lung field: R91.8

## 2023-06-12 HISTORY — DX: Neoplasm of unspecified behavior of endocrine glands and other parts of nervous system: D49.7

## 2023-06-12 SURGERY — BRONCHOSCOPY, WITH EBUS
Anesthesia: General | Laterality: Left

## 2023-06-12 MED ORDER — EPINEPHRINE 1 MG/10ML IJ SOSY
PREFILLED_SYRINGE | INTRAMUSCULAR | Status: AC
  Filled 2023-06-12: qty 10

## 2023-06-12 MED ORDER — LIDOCAINE 2% (20 MG/ML) 5 ML SYRINGE
INTRAMUSCULAR | Status: DC | PRN
  Administered 2023-06-12: 60 mg via INTRAVENOUS

## 2023-06-12 MED ORDER — PROPOFOL 10 MG/ML IV BOLUS
INTRAVENOUS | Status: DC | PRN
Start: 2023-06-12 — End: 2023-06-12
  Administered 2023-06-12: 200 mg via INTRAVENOUS

## 2023-06-12 MED ORDER — PROPOFOL 500 MG/50ML IV EMUL
INTRAVENOUS | Status: DC | PRN
  Administered 2023-06-12: 125 ug/kg/min via INTRAVENOUS

## 2023-06-12 MED ORDER — ROCURONIUM BROMIDE 10 MG/ML (PF) SYRINGE
PREFILLED_SYRINGE | INTRAVENOUS | Status: DC | PRN
  Administered 2023-06-12: 60 mg via INTRAVENOUS

## 2023-06-12 MED ORDER — APIXABAN 5 MG PO TABS: 5.0000 mg | ORAL_TABLET | Freq: Two times a day (BID) | ORAL | Status: DC

## 2023-06-12 MED ORDER — PHENYLEPHRINE 80 MCG/ML (10ML) SYRINGE FOR IV PUSH (FOR BLOOD PRESSURE SUPPORT)
PREFILLED_SYRINGE | INTRAVENOUS | Status: DC | PRN
  Administered 2023-06-12: 160 ug via INTRAVENOUS

## 2023-06-12 MED ORDER — SODIUM CHLORIDE (PF) 0.9 % IJ SOLN
PREFILLED_SYRINGE | INTRAMUSCULAR | Status: DC | PRN
  Administered 2023-06-12: 2 mL
  Administered 2023-06-12: 4 mL

## 2023-06-12 MED ORDER — CHLORHEXIDINE GLUCONATE 0.12 % MT SOLN
OROMUCOSAL | Status: AC
  Administered 2023-06-12: 15 mL
  Filled 2023-06-12: qty 15

## 2023-06-12 MED ORDER — LACTATED RINGERS IV SOLN: INTRAVENOUS | Status: DC

## 2023-06-12 NOTE — Interval H&P Note (Signed)
 History and Physical Interval Note:  06/12/2023 7:40 AM  Raymond Hurst  has presented today for surgery, with the diagnosis of Left hilar mass.  The various methods of treatment have been discussed with the patient and family. After consideration of risks, benefits and other options for treatment, the patient has consented to  Procedure(s): BRONCHOSCOPY, WITH EBUS (Left) as a surgical intervention.  The patient's history has been reviewed, patient examined, no change in status, stable for surgery.  I have reviewed the patient's chart and labs.  Questions were answered to the patient's satisfaction.     Denson Flake

## 2023-06-12 NOTE — Transfer of Care (Signed)
 Immediate Anesthesia Transfer of Care Note  Patient: Raymond Hurst  Procedure(s) Performed: BRONCHOSCOPY, WITH EBUS (Left) BRONCHOSCOPY, WITH NEEDLE ASPIRATION BIOPSY BRONCHOSCOPY, WITH BRUSH BIOPSY BRONCHOSCOPY, WITH BIOPSY CONTROL OF HEMORRHAGE bronch cold saline and epi  Patient Location: PACU  Anesthesia Type:General  Level of Consciousness: awake, alert , and oriented  Airway & Oxygen Therapy: Patient Spontanous Breathing and Patient connected to nasal cannula oxygen  Post-op Assessment: Report given to RN and Post -op Vital signs reviewed and stable  Post vital signs: Reviewed and stable  Last Vitals:  Vitals Value Taken Time  BP 110/69 06/12/23 1100  Temp    Pulse 84 06/12/23 1103  Resp 23 06/12/23 1103  SpO2 93 % 06/12/23 1103  Vitals shown include unfiled device data.  Last Pain:  Vitals:   06/12/23 0719  TempSrc: Oral  PainSc: 0-No pain         Complications: No notable events documented.

## 2023-06-12 NOTE — Telephone Encounter (Signed)
 Wife reports Raymond Hurst was also referred for ENT for vocal cord paralysis. She is asking if she should proceed with this or wait for other issues to be addressed? Scheduled for new patient appointment w/oncology on 5/15 for lung mass. See pulmonary on 06/20/23. Had bronch today (no path yet).

## 2023-06-12 NOTE — Discharge Instructions (Addendum)
 Flexible Bronchoscopy, Care After This sheet gives you information about how to care for yourself after your test. Your doctor may also give you more specific instructions. If you have problems or questions, contact your doctor. Follow these instructions at home: Eating and drinking When you are wide awake, your numbness is gone and your cough and gag reflexes have come back, you may: Start eating only soft foods. Slowly drink liquids. Six hours after the test, go back to your normal diet. Driving Do not drive for 24 hours if you were given a medicine to help you relax (sedative). Do not drive or use heavy machinery while taking prescription pain medicine. General instructions Take over-the-counter and prescription medicines only as told by your doctor. Return to your normal activities as told. Ask what activities are safe for you. Do not use any products that have nicotine or tobacco in them. This includes cigarettes and e-cigarettes. If you need help quitting, ask your doctor. Keep all follow-up visits as told by your doctor. This is important. It is very important if you had a tissue sample (biopsy) taken. Get help right away if: You have shortness of breath that gets worse. You get light-headed. You feel like you are going to pass out (faint). You have chest pain. You cough up: More than a little blood. More blood than before. Summary Do not use cigarettes. Do not use e-cigarettes. Seek care in the Emergency Department right away if you have chest pain or shortness of breath. Call or MyChart Message our office for any questions or problems at [phone].  Okay to restart your Eliquis  on 06/13/2023   This information is not intended to replace advice given to you by your health care provider. Make sure you discuss any questions you have with your health care provider.

## 2023-06-12 NOTE — Anesthesia Postprocedure Evaluation (Signed)
 Anesthesia Post Note  Patient: Raymond Hurst  Procedure(s) Performed: BRONCHOSCOPY, WITH EBUS (Left) BRONCHOSCOPY, WITH NEEDLE ASPIRATION BIOPSY BRONCHOSCOPY, WITH BRUSH BIOPSY BRONCHOSCOPY, WITH BIOPSY CONTROL OF HEMORRHAGE bronch cold saline and epi     Patient location during evaluation: PACU Anesthesia Type: General Level of consciousness: awake and alert Pain management: pain level controlled Vital Signs Assessment: post-procedure vital signs reviewed and stable Respiratory status: spontaneous breathing, nonlabored ventilation, respiratory function stable and patient connected to nasal cannula oxygen Cardiovascular status: blood pressure returned to baseline and stable Postop Assessment: no apparent nausea or vomiting Anesthetic complications: no  No notable events documented.  Last Vitals:  Vitals:   06/12/23 1115 06/12/23 1130  BP: 102/66 107/62  Pulse: 87 83  Resp: 20 (!) 21  Temp:  36.7 C  SpO2: 95% 96%    Last Pain:  Vitals:   06/12/23 1130  TempSrc:   PainSc: 0-No pain                 Melvenia Stabs

## 2023-06-12 NOTE — Op Note (Signed)
 Video Bronchoscopy with Endobronchial Ultrasound Procedure Note  Date of Operation: 06/12/2023  Pre-op Diagnosis: Left hilar mass, mediastinal adenopathy  Post-op Diagnosis: Same  Surgeon: Racheal Buddle  Assistants: None  Anesthesia: General endotracheal anesthesia  Operation: Flexible video fiberoptic bronchoscopy with endobronchial ultrasound and biopsies.  Estimated Blood Loss: less than 25 cc  Complications: None apparent  Indications and History: Raymond Hurst is a 73 y.o. male with history tobacco use.  Found to have a left hilar mass with associated mediastinal adenopathy, bronchial airway narrowing on CT scan of the chest.  Recommendation made to achieve a tissue diagnosis via endobronchial ultrasound with biopsies.  The risks, benefits, complications, treatment options and expected outcomes were discussed with the patient.  The possibilities of pneumothorax, pneumonia, reaction to medication, pulmonary aspiration, perforation of a viscus, bleeding, failure to diagnose a condition and creating a complication requiring transfusion or operation were discussed with the patient who freely signed the consent.    Description of Procedure: The patient was examined in the preoperative area and history and data from the preprocedure consultation were reviewed. It was deemed appropriate to proceed.  The patient was taken to [facility] Endoscopy room 3, identified as Raymond Hurst and the procedure verified as Flexible Video Fiberoptic Bronchoscopy.  A Time Out was held and the above information confirmed. After being taken to the operating room general anesthesia was initiated and the patient  was orally intubated. The video fiberoptic bronchoscope was introduced via the endotracheal tube and a general inspection was performed which showed normal airways on the right.  The left upper lobe carina was somewhat splayed.  There was some lateral left mainstem bronchial irregularity that extended  into the left upper lobe airways narrowing the left upper lobe airways.  There was no discrete endobronchial lesion.  The left lower lobe airways appeared normal.  Endobronchial brushings were performed in the narrowed left upper lobe airways to be sent for cytology.  Endobronchial forceps biopsies were performed in this region as well to be sent for cytology and pathology.  There was some moderate oozing from the biopsy site and 1:10000 dilution epinephrine was injected onto the left upper lobe airways, 6 cc total in divided doses.  The standard scope was then withdrawn and the endobronchial ultrasound was used to identify and characterize the peritracheal, hilar and bronchial lymph nodes. Inspection showed a left hilar mass with significant vascularity.  There was also enlargement at station 4L, 11L, 7. Using real-time ultrasound guidance Wang needle biopsies were take from the left hilar mass and Station 4L, 11L, 7 nodes and were sent for cytology. The patient tolerated the procedure well without apparent complications. There was no significant blood loss. The bronchoscope was withdrawn. Anesthesia was reversed and the patient was taken to the PACU for recovery.   Samples: 1. Wang needle biopsies from left hilar mass 2. Wang needle biopsies from 4L node 3. Wang needle biopsies from 7 node 4. Wang needle biopsies from 11L node 5.  Endobronchial brushings from left upper lobe airway 6.  Endobronchial forceps biopsies from left upper lobe airway  Plans:  The patient will be discharged from the PACU to home when recovered from anesthesia. We will review the cytology, pathology and microbiology results with the patient when they become available. Outpatient followup will be with Dr. Baldwin Levee and Braden Caddy, NP.    Racheal Buddle S. 06/12/2023

## 2023-06-13 ENCOUNTER — Emergency Department (HOSPITAL_COMMUNITY)

## 2023-06-13 ENCOUNTER — Observation Stay (HOSPITAL_COMMUNITY)
Admission: EM | Admit: 2023-06-13 | Discharge: 2023-06-14 | Disposition: A | Discharge status: Home / Self Care | Attending: Internal Medicine | Admitting: Internal Medicine

## 2023-06-13 ENCOUNTER — Other Ambulatory Visit: Payer: Self-pay

## 2023-06-13 ENCOUNTER — Encounter: Payer: Self-pay | Admitting: *Deleted

## 2023-06-13 ENCOUNTER — Encounter (HOSPITAL_COMMUNITY): Payer: Self-pay | Admitting: Emergency Medicine

## 2023-06-13 ENCOUNTER — Ambulatory Visit: Payer: Self-pay

## 2023-06-13 DIAGNOSIS — R0600 Dyspnea, unspecified: Secondary | ICD-10-CM | POA: Diagnosis not present

## 2023-06-13 DIAGNOSIS — Z87891 Personal history of nicotine dependence: Secondary | ICD-10-CM | POA: Insufficient documentation

## 2023-06-13 DIAGNOSIS — J90 Pleural effusion, not elsewhere classified: Secondary | ICD-10-CM | POA: Diagnosis not present

## 2023-06-13 DIAGNOSIS — R000 Tachycardia, unspecified: Secondary | ICD-10-CM | POA: Diagnosis not present

## 2023-06-13 DIAGNOSIS — G4733 Obstructive sleep apnea (adult) (pediatric): Secondary | ICD-10-CM

## 2023-06-13 DIAGNOSIS — Z6841 Body Mass Index (BMI) 40.0 and over, adult: Secondary | ICD-10-CM | POA: Insufficient documentation

## 2023-06-13 DIAGNOSIS — Z7901 Long term (current) use of anticoagulants: Secondary | ICD-10-CM | POA: Diagnosis not present

## 2023-06-13 DIAGNOSIS — E876 Hypokalemia: Secondary | ICD-10-CM | POA: Diagnosis not present

## 2023-06-13 DIAGNOSIS — Z8673 Personal history of transient ischemic attack (TIA), and cerebral infarction without residual deficits: Secondary | ICD-10-CM | POA: Diagnosis not present

## 2023-06-13 DIAGNOSIS — I4891 Unspecified atrial fibrillation: Secondary | ICD-10-CM | POA: Diagnosis not present

## 2023-06-13 DIAGNOSIS — Z96652 Presence of left artificial knee joint: Secondary | ICD-10-CM | POA: Diagnosis not present

## 2023-06-13 DIAGNOSIS — I480 Paroxysmal atrial fibrillation: Secondary | ICD-10-CM | POA: Diagnosis present

## 2023-06-13 DIAGNOSIS — R918 Other nonspecific abnormal finding of lung field: Secondary | ICD-10-CM | POA: Diagnosis not present

## 2023-06-13 DIAGNOSIS — F1010 Alcohol abuse, uncomplicated: Secondary | ICD-10-CM | POA: Insufficient documentation

## 2023-06-13 DIAGNOSIS — R059 Cough, unspecified: Secondary | ICD-10-CM | POA: Insufficient documentation

## 2023-06-13 DIAGNOSIS — I959 Hypotension, unspecified: Secondary | ICD-10-CM | POA: Insufficient documentation

## 2023-06-13 DIAGNOSIS — R002 Palpitations: Secondary | ICD-10-CM | POA: Diagnosis present

## 2023-06-13 DIAGNOSIS — I4821 Permanent atrial fibrillation: Secondary | ICD-10-CM | POA: Diagnosis not present

## 2023-06-13 DIAGNOSIS — J9601 Acute respiratory failure with hypoxia: Secondary | ICD-10-CM | POA: Diagnosis present

## 2023-06-13 DIAGNOSIS — E559 Vitamin D deficiency, unspecified: Secondary | ICD-10-CM | POA: Diagnosis present

## 2023-06-13 DIAGNOSIS — R0602 Shortness of breath: Secondary | ICD-10-CM | POA: Insufficient documentation

## 2023-06-13 DIAGNOSIS — E538 Deficiency of other specified B group vitamins: Secondary | ICD-10-CM | POA: Diagnosis present

## 2023-06-13 DIAGNOSIS — I890 Lymphedema, not elsewhere classified: Secondary | ICD-10-CM | POA: Insufficient documentation

## 2023-06-13 LAB — BLOOD GAS, VENOUS
Acid-Base Excess: 3.2 mmol/L — ABNORMAL HIGH (ref 0.0–2.0)
Bicarbonate: 28.5 mmol/L — ABNORMAL HIGH (ref 20.0–28.0)
Drawn by: 66615
O2 Saturation: 69.4 %
Patient temperature: 36.5
pCO2, Ven: 44 mmHg (ref 44–60)
pH, Ven: 7.42 (ref 7.25–7.43)
pO2, Ven: 39 mmHg (ref 32–45)

## 2023-06-13 LAB — CBC WITH DIFFERENTIAL/PLATELET
Abs Immature Granulocytes: 0.02 10*3/uL (ref 0.00–0.07)
Basophils Absolute: 0 10*3/uL (ref 0.0–0.1)
Basophils Relative: 0 %
Eosinophils Absolute: 0 10*3/uL (ref 0.0–0.5)
Eosinophils Relative: 0 %
HCT: 38.4 % — ABNORMAL LOW (ref 39.0–52.0)
Hemoglobin: 11.9 g/dL — ABNORMAL LOW (ref 13.0–17.0)
Immature Granulocytes: 0 %
Lymphocytes Relative: 26 %
Lymphs Abs: 1.9 10*3/uL (ref 0.7–4.0)
MCH: 25.9 pg — ABNORMAL LOW (ref 26.0–34.0)
MCHC: 31 g/dL (ref 30.0–36.0)
MCV: 83.7 fL (ref 80.0–100.0)
Monocytes Absolute: 0.3 10*3/uL (ref 0.1–1.0)
Monocytes Relative: 4 %
Neutro Abs: 5 10*3/uL (ref 1.7–7.7)
Neutrophils Relative %: 70 %
Platelets: 284 10*3/uL (ref 150–400)
RBC: 4.59 MIL/uL (ref 4.22–5.81)
RDW: 17.9 % — ABNORMAL HIGH (ref 11.5–15.5)
WBC: 7.3 10*3/uL (ref 4.0–10.5)
nRBC: 0 % (ref 0.0–0.2)

## 2023-06-13 LAB — RESP PANEL BY RT-PCR (RSV, FLU A&B, COVID)  RVPGX2
Influenza A by PCR: NEGATIVE
Influenza B by PCR: NEGATIVE
Resp Syncytial Virus by PCR: NEGATIVE
SARS Coronavirus 2 by RT PCR: NEGATIVE

## 2023-06-13 LAB — D-DIMER, QUANTITATIVE: D-Dimer, Quant: 2.48 ug{FEU}/mL — ABNORMAL HIGH (ref 0.00–0.50)

## 2023-06-13 LAB — COMPREHENSIVE METABOLIC PANEL WITH GFR
ALT: 12 U/L (ref 0–44)
AST: 17 U/L (ref 15–41)
Albumin: 2.8 g/dL — ABNORMAL LOW (ref 3.5–5.0)
Alkaline Phosphatase: 61 U/L (ref 38–126)
Anion gap: 10 (ref 5–15)
BUN: 9 mg/dL (ref 8–23)
CO2: 24 mmol/L (ref 22–32)
Calcium: 8.6 mg/dL — ABNORMAL LOW (ref 8.9–10.3)
Chloride: 103 mmol/L (ref 98–111)
Creatinine, Ser: 0.83 mg/dL (ref 0.61–1.24)
GFR, Estimated: >60 mL/min (ref >60)
Glucose, Bld: 151 mg/dL — ABNORMAL HIGH (ref 70–99)
Potassium: 3.3 mmol/L — ABNORMAL LOW (ref 3.5–5.1)
Sodium: 137 mmol/L (ref 135–145)
Total Bilirubin: 0.5 mg/dL (ref 0.0–1.2)
Total Protein: 7 g/dL (ref 6.5–8.1)

## 2023-06-13 LAB — PROTIME-INR
INR: 1.1 (ref 0.8–1.2)
Prothrombin Time: 14.2 s (ref 11.4–15.2)

## 2023-06-13 LAB — BRAIN NATRIURETIC PEPTIDE: B Natriuretic Peptide: 130 pg/mL — ABNORMAL HIGH (ref 0.0–100.0)

## 2023-06-13 LAB — TROPONIN I (HIGH SENSITIVITY)
Troponin I (High Sensitivity): 6 ng/L (ref <18)
Troponin I (High Sensitivity): 7 ng/L (ref <18)

## 2023-06-13 LAB — MAGNESIUM: Magnesium: 1.9 mg/dL (ref 1.7–2.4)

## 2023-06-13 LAB — TYPE AND SCREEN
ABO/RH(D): O NEG
Antibody Screen: NEGATIVE

## 2023-06-13 LAB — GLUCOSE, CAPILLARY: Glucose-Capillary: 128 mg/dL — ABNORMAL HIGH (ref 70–99)

## 2023-06-13 LAB — MRSA NEXT GEN BY PCR, NASAL: MRSA by PCR Next Gen: NOT DETECTED

## 2023-06-13 LAB — PHOSPHORUS: Phosphorus: 4 mg/dL (ref 2.5–4.6)

## 2023-06-13 LAB — LACTIC ACID, PLASMA: Lactic Acid, Venous: 1.2 mmol/L (ref 0.5–1.9)

## 2023-06-13 MED ORDER — METOPROLOL TARTRATE 25 MG PO TABS
25.0000 mg | ORAL_TABLET | Freq: Once | ORAL | Status: AC
  Administered 2023-06-13: 25 mg via ORAL
  Filled 2023-06-13: qty 1

## 2023-06-13 MED ORDER — FLEET ENEMA RE ENEM: 1.0000 | ENEMA | Freq: Once | RECTAL | Status: DC | PRN

## 2023-06-13 MED ORDER — SODIUM CHLORIDE 0.9 % IV SOLN: 250.0000 mL | INTRAVENOUS | Status: AC

## 2023-06-13 MED ORDER — IPRATROPIUM BROMIDE 0.02 % IN SOLN: 0.5000 mg | Freq: Four times a day (QID) | RESPIRATORY_TRACT | Status: DC | PRN

## 2023-06-13 MED ORDER — SENNOSIDES-DOCUSATE SODIUM 8.6-50 MG PO TABS: 1.0000 | ORAL_TABLET | Freq: Every evening | ORAL | Status: DC | PRN

## 2023-06-13 MED ORDER — ACETAMINOPHEN 650 MG RE SUPP: 650.0000 mg | Freq: Four times a day (QID) | RECTAL | Status: DC | PRN

## 2023-06-13 MED ORDER — FOLIC ACID 1 MG PO TABS
1.0000 mg | ORAL_TABLET | Freq: Every day | ORAL | Status: DC
  Administered 2023-06-13 – 2023-06-14 (×2): 1 mg via ORAL
  Filled 2023-06-13 (×2): qty 1

## 2023-06-13 MED ORDER — LORAZEPAM 2 MG/ML IJ SOLN: 1.0000 mg | INTRAMUSCULAR | Status: DC | PRN

## 2023-06-13 MED ORDER — ALLOPURINOL 100 MG PO TABS
100.0000 mg | ORAL_TABLET | Freq: Every day | ORAL | Status: DC
  Administered 2023-06-14: 100 mg via ORAL
  Filled 2023-06-13: qty 1

## 2023-06-13 MED ORDER — IOHEXOL 300 MG/ML  SOLN
75.0000 mL | Freq: Once | INTRAMUSCULAR | Status: AC | PRN
  Administered 2023-06-13: 75 mL via INTRAVENOUS

## 2023-06-13 MED ORDER — APIXABAN 5 MG PO TABS
5.0000 mg | ORAL_TABLET | Freq: Two times a day (BID) | ORAL | Status: DC
  Administered 2023-06-13 – 2023-06-14 (×3): 5 mg via ORAL
  Filled 2023-06-13 (×3): qty 1

## 2023-06-13 MED ORDER — POTASSIUM CHLORIDE CRYS ER 20 MEQ PO TBCR
40.0000 meq | EXTENDED_RELEASE_TABLET | Freq: Once | ORAL | Status: AC
  Administered 2023-06-13: 40 meq via ORAL
  Filled 2023-06-13: qty 2

## 2023-06-13 MED ORDER — THIAMINE HCL 100 MG/ML IJ SOLN: 100.0000 mg | Freq: Every day | INTRAMUSCULAR | Status: DC

## 2023-06-13 MED ORDER — LACTATED RINGERS IV BOLUS
500.0000 mL | Freq: Once | INTRAVENOUS | Status: AC
  Administered 2023-06-13: 500 mL via INTRAVENOUS

## 2023-06-13 MED ORDER — AMIODARONE HCL IN DEXTROSE 360-4.14 MG/200ML-% IV SOLN
60.0000 mg/h | INTRAVENOUS | Status: DC
  Filled 2023-06-13: qty 200

## 2023-06-13 MED ORDER — OXYCODONE HCL 5 MG PO TABS: 5.0000 mg | ORAL_TABLET | ORAL | Status: DC | PRN

## 2023-06-13 MED ORDER — SODIUM CHLORIDE 0.9 % IV SOLN: INTRAVENOUS | Status: DC

## 2023-06-13 MED ORDER — ADULT MULTIVITAMIN W/MINERALS CH
1.0000 | ORAL_TABLET | Freq: Every day | ORAL | Status: DC
  Administered 2023-06-13 – 2023-06-14 (×2): 1 via ORAL
  Filled 2023-06-13 (×2): qty 1

## 2023-06-13 MED ORDER — FUROSEMIDE 40 MG PO TABS
40.0000 mg | ORAL_TABLET | Freq: Every day | ORAL | Status: DC
  Administered 2023-06-14: 40 mg via ORAL
  Filled 2023-06-13: qty 1

## 2023-06-13 MED ORDER — ACETAMINOPHEN 325 MG PO TABS: 650.0000 mg | ORAL_TABLET | Freq: Four times a day (QID) | ORAL | Status: DC | PRN

## 2023-06-13 MED ORDER — HYDROMORPHONE HCL 1 MG/ML IJ SOLN: 0.5000 mg | INTRAMUSCULAR | Status: DC | PRN

## 2023-06-13 MED ORDER — NOREPINEPHRINE 4 MG/250ML-% IV SOLN
0.0000 ug/min | INTRAVENOUS | Status: DC
Start: 2023-06-13 — End: 2023-06-13
  Administered 2023-06-13: 2 ug/min via INTRAVENOUS
  Filled 2023-06-13: qty 250

## 2023-06-13 MED ORDER — CHLORHEXIDINE GLUCONATE CLOTH 2 % EX PADS
6.0000 | MEDICATED_PAD | Freq: Every day | CUTANEOUS | Status: DC
  Administered 2023-06-13 – 2023-06-14 (×2): 6 via TOPICAL

## 2023-06-13 MED ORDER — HEPARIN SODIUM (PORCINE) 5000 UNIT/ML IJ SOLN: 5000.0000 [IU] | Freq: Three times a day (TID) | INTRAMUSCULAR | Status: DC

## 2023-06-13 MED ORDER — LEVALBUTEROL HCL 0.63 MG/3ML IN NEBU: 0.6300 mg | INHALATION_SOLUTION | Freq: Four times a day (QID) | RESPIRATORY_TRACT | Status: DC | PRN

## 2023-06-13 MED ORDER — SODIUM CHLORIDE 0.9% FLUSH
3.0000 mL | Freq: Two times a day (BID) | INTRAVENOUS | Status: DC
  Administered 2023-06-13 – 2023-06-14 (×3): 3 mL via INTRAVENOUS

## 2023-06-13 MED ORDER — AMIODARONE LOAD VIA INFUSION
150.0000 mg | Freq: Once | INTRAVENOUS | Status: DC
  Filled 2023-06-13: qty 83.34

## 2023-06-13 MED ORDER — ONDANSETRON HCL 4 MG PO TABS: 4.0000 mg | ORAL_TABLET | Freq: Four times a day (QID) | ORAL | Status: DC | PRN

## 2023-06-13 MED ORDER — THIAMINE MONONITRATE 100 MG PO TABS
100.0000 mg | ORAL_TABLET | Freq: Every day | ORAL | Status: DC
  Administered 2023-06-13 – 2023-06-14 (×2): 100 mg via ORAL
  Filled 2023-06-13 (×2): qty 1

## 2023-06-13 MED ORDER — BISACODYL 5 MG PO TBEC: 5.0000 mg | DELAYED_RELEASE_TABLET | Freq: Every day | ORAL | Status: DC | PRN

## 2023-06-13 MED ORDER — HYDRALAZINE HCL 20 MG/ML IJ SOLN: 10.0000 mg | INTRAMUSCULAR | Status: DC | PRN

## 2023-06-13 MED ORDER — LORAZEPAM 1 MG PO TABS: 1.0000 mg | ORAL_TABLET | ORAL | Status: DC | PRN

## 2023-06-13 MED ORDER — ONDANSETRON HCL 4 MG/2ML IJ SOLN: 4.0000 mg | Freq: Four times a day (QID) | INTRAMUSCULAR | Status: DC | PRN

## 2023-06-13 MED ORDER — TRAZODONE HCL 50 MG PO TABS
25.0000 mg | ORAL_TABLET | Freq: Every evening | ORAL | Status: DC | PRN
  Administered 2023-06-13: 25 mg via ORAL
  Filled 2023-06-13: qty 1

## 2023-06-13 MED ORDER — NOREPINEPHRINE 4 MG/250ML-% IV SOLN: 2.0000 ug/min | INTRAVENOUS | Status: DC

## 2023-06-13 MED ORDER — METOPROLOL TARTRATE 25 MG PO TABS
25.0000 mg | ORAL_TABLET | Freq: Two times a day (BID) | ORAL | Status: DC
  Administered 2023-06-13 – 2023-06-14 (×2): 25 mg via ORAL
  Filled 2023-06-13 (×3): qty 1

## 2023-06-13 MED ORDER — AMIODARONE HCL IN DEXTROSE 360-4.14 MG/200ML-% IV SOLN: 30.0000 mg/h | INTRAVENOUS | Status: DC

## 2023-06-13 NOTE — Evaluation (Signed)
 Physical Therapy Evaluation Patient Details Name: Raymond Hurst MRN: [id number] DOB: Jun 12, 1950 Today's Date: 06/13/2023  History of Present Illness  HPI  Patient presents for dizziness and palpitations.  Medical history includes alcohol abuse, lymphedema, atrial fibrillation, gout, GI bleeding, CVA.  He is prescribed metoprolol , Lasix , and Eliquis .  He underwent bronchoscopy with EBUS earlier today for evaluation of a left hilar mass.  This was around 9 AM.  He was instructed to hold his Eliquis  but inadvertently took his evening dose.  He was doing well up until around 11:30 PM.  At that time, he developed dizziness, palpitations, lightheadedness, feeling flushed, and a worsening cough.  He states that he has coughed up a small amount of red blood.  He denies any current areas of pain.  He is not on oxygen at baseline.   Clinical Impression  Patient functioning at baseline for functional mobility and gait other than one episode of mild stumbling, patient able to self correct and demonstrated good return for ambulating in room, hallways without loss of balance or need for an AD. Plan:  Patient discharged from physical therapy to care of nursing for ambulation daily as tolerated for length of stay.          If plan is discharge home, recommend the following: Help with stairs or ramp for entrance   Can travel by private vehicle        Equipment Recommendations None recommended by PT  Recommendations for Other Services       Functional Status Assessment Patient has not had a recent decline in their functional status     Precautions / Restrictions Precautions Precautions: None Recall of Precautions/Restrictions: Intact Restrictions Weight Bearing Restrictions Per Provider Order: No      Mobility  Bed Mobility Overal bed mobility: Modified Independent                  Transfers Overall transfer level: Independent                       Ambulation/Gait Ambulation/Gait assistance: Modified independent (Device/Increase time), Supervision Gait Distance (Feet): 100 Feet Assistive device: None Gait Pattern/deviations: WFL(Within Functional Limits), Staggering right Gait velocity: near normal     General Gait Details: other than one episode of mild stumbling, patient able to self correct and demonstrated good return for ambulating in room, hallways without loss of balance or need for an AD  Stairs            Wheelchair Mobility     Tilt Bed    Modified Rankin (Stroke Patients Only)       Balance Overall balance assessment: Mild deficits observed, not formally tested                                           Pertinent Vitals/Pain Pain Assessment Pain Assessment: No/denies pain    Home Living Family/patient expects to be discharged to:: Private residence Living Arrangements: Spouse/significant other Available Help at Discharge: Family;Available 24 hours/day Type of Home: House Home Access: Stairs to enter Entrance Stairs-Rails: None Entrance Stairs-Number of Steps: 2 Alternate Level Stairs-Number of Steps: 15 Home Layout: Two level Home Equipment: Agricultural consultant (2 wheels)      Prior Function Prior Level of Function : Independent/Modified Independent             Mobility Comments: Tourist information centre manager  without AD ADLs Comments: Independent     Extremity/Trunk Assessment   Upper Extremity Assessment Upper Extremity Assessment: Defer to OT evaluation    Lower Extremity Assessment Lower Extremity Assessment: Overall WFL for tasks assessed    Cervical / Trunk Assessment Cervical / Trunk Assessment: Normal  Communication   Communication Communication: No apparent difficulties    Cognition Arousal: Alert Behavior During Therapy: WFL for tasks assessed/performed   PT - Cognitive impairments: No apparent impairments                         Following  commands: Intact       Cueing Cueing Techniques: Verbal cues     General Comments      Exercises     Assessment/Plan    PT Assessment Patient does not need any further PT services  PT Problem List         PT Treatment Interventions      PT Goals (Current goals can be found in the Care Plan section)  Acute Rehab PT Goals Patient Stated Goal: return home PT Goal Formulation: With patient Time For Goal Achievement: 06/13/23 Potential to Achieve Goals: Good    Frequency       Co-evaluation PT/OT/SLP Co-Evaluation/Treatment: Yes Reason for Co-Treatment: To address functional/ADL transfers PT goals addressed during session: Mobility/safety with mobility;Balance         AM-PAC PT "6 Clicks" Mobility  Outcome Measure Help needed turning from your back to your side while in a flat bed without using bedrails?: None Help needed moving from lying on your back to sitting on the side of a flat bed without using bedrails?: None Help needed moving to and from a bed to a chair (including a wheelchair)?: None Help needed standing up from a chair using your arms (e.g., wheelchair or bedside chair)?: None Help needed to walk in hospital room?: None Help needed climbing 3-5 steps with a railing? : A Little 6 Click Score: 23    End of Session   Activity Tolerance: Patient tolerated treatment well Patient left: in chair;with call bell/phone within reach Nurse Communication: Mobility status PT Visit Diagnosis: Unsteadiness on feet (R26.81);Other abnormalities of gait and mobility (R26.89);Muscle weakness (generalized) (M62.81)    Time: 1610-9604 PT Time Calculation (min) (ACUTE ONLY): 23 min   Charges:   PT Evaluation $PT Eval Moderate Complexity: 1 Mod PT Treatments $Therapeutic Activity: 23-37 mins PT General Charges $$ ACUTE PT VISIT: 1 Visit         12:11 PM, 06/13/23 Walton Guppy, MPT Physical Therapist with [facility] [phone]  office [phone] mobile phone

## 2023-06-13 NOTE — Telephone Encounter (Signed)
 Thank you for letting me know. Agree with urgent evaluation.

## 2023-06-13 NOTE — Discharge Instructions (Signed)
 Outpatient Substance Use Treatment Services [facility] [address] [place], [place] [address]  [phone] Private insurance, Medicare A&B, and [facility]  ADS (Alcohol and Drug Services)  [address]  [place], [place] [address] [phone] Medicaid, Self Pay  [facility]  [address] Geraldene Kleine  [place], [place] [phone] Medicaid and [facility], Self Pay  [facility] [address]  [place], [place]  [phone] [facility], and Self Pay [facility] [place]  [address]  [place], [place] [address]  [phone] or [phone] Private Insurance Only  [facility] [address]  [place], [place]  [phone] Medicaid, Medicare, Private Insurance [facility] at  the [facility] [address], [address]  [place], [place] [address] [phone] Services are free or reduced [facility]  [address] [phone]  Self Pay only, sliding scale [facility]  [address]  [place], [place] [address] [phone] ([facility]) Self Pay, Medicaid Only  [facility] [address], [place] [address] [phone] Medicaid, Medicare, Private Insurance Crisis Mobile: [facility]: [phone] (for crisis response 24 hours a day) [facility] [phone]

## 2023-06-13 NOTE — ED Triage Notes (Signed)
 Pt c/o dizziness, heart racing and face flushness.

## 2023-06-13 NOTE — ED Provider Notes (Signed)
 [place] EMERGENCY DEPARTMENT AT [facility] Provider Note   CSN: [id number] Arrival date & time: 06/13/23  0012     History  Chief Complaint  Patient presents with   Palpitations    Raymond Hurst is a 73 y.o. male.  HPI Patient presents for dizziness and palpitations.  Medical history includes alcohol abuse, lymphedema, atrial fibrillation, gout, GI bleeding, CVA.  He is prescribed metoprolol , Lasix , and Eliquis .  He underwent bronchoscopy with EBUS earlier today for evaluation of a left hilar mass.  This was around 9 AM.  He was instructed to hold his Eliquis  but inadvertently took his evening dose.  He was doing well up until around 11:30 PM.  At that time, he developed dizziness, palpitations, lightheadedness, feeling flushed, and a worsening cough.  He states that he has coughed up a small amount of red blood.  He denies any current areas of pain.  He is not on oxygen at baseline.    Home Medications Prior to Admission medications   Medication Sig Start Date End Date Taking? Authorizing Provider  acetaminophen  (TYLENOL ) 500 MG tablet Take 500 mg by mouth every 6 (six) hours as needed for mild pain.    [provider]  allopurinol  (ZYLOPRIM ) 100 MG tablet Take 100 mg by mouth daily. 05/25/22   [provider]  apixaban  (ELIQUIS ) 5 MG TABS tablet Take 1 tablet (5 mg total) by mouth 2 (two) times daily. Okay to restart this medication on 06/13/2023 06/12/23   Denson Flake, MD  Cholecalciferol  (VITAMIN D ) 125 MCG (5000 UT) CAPS Take 5,000 Units by mouth daily.    [provider]  Cyanocobalamin  (VITAMIN B 12 PO) Take 1 tablet by mouth daily.    [provider]  furosemide  (LASIX ) 40 MG tablet Take 1 tablet (40 mg total) by mouth daily. 05/31/23   [name], NP  metoprolol  tartrate (LOPRESSOR ) 25 MG tablet Take 1 tablet (25 mg total) by mouth 2 (two) times daily. 11/17/21   Dorma Gash, PA-C      Allergies    Patient has  no known allergies.    Review of Systems   Review of Systems  Constitutional:  Positive for fatigue.  Respiratory:  Positive for cough and shortness of breath.   Cardiovascular:  Positive for palpitations.  Skin:  Positive for color change.  Neurological:  Positive for dizziness and headaches.  All other systems reviewed and are negative.   Physical Exam Updated Vital Signs BP 100/61   Pulse 78   Temp 98.1 F (36.7 C)   Resp 14   Ht 6' (1.829 m)   Wt 133 kg   SpO2 94%   BMI 39.77 kg/m  Physical Exam Vitals and nursing note reviewed.  Constitutional:      General: He is not in acute distress.    Appearance: Normal appearance. He is well-developed. He is ill-appearing. He is not toxic-appearing or diaphoretic.  HENT:     Head: Normocephalic and atraumatic.     Right Ear: External ear normal.     Left Ear: External ear normal.     Nose: Nose normal.     Mouth/Throat:     Mouth: Mucous membranes are moist.  Eyes:     Extraocular Movements: Extraocular movements intact.     Conjunctiva/sclera: Conjunctivae normal.  Cardiovascular:     Rate and Rhythm: Tachycardia present. Rhythm irregular.     Heart sounds: No murmur heard. Pulmonary:     Effort:  Pulmonary effort is normal. No respiratory distress.     Breath sounds: Rhonchi present. No wheezing or rales.  Chest:     Chest wall: No tenderness.  Abdominal:     General: There is no distension.     Palpations: Abdomen is soft.     Tenderness: There is no abdominal tenderness.  Musculoskeletal:        General: No swelling.     Cervical back: Normal range of motion and neck supple.     Right lower leg: Edema present.     Left lower leg: Edema present.  Skin:    General: Skin is warm and dry.     Capillary Refill: Capillary refill takes less than 2 seconds.     Coloration: Skin is not jaundiced or pale.     Findings: Erythema present.  Neurological:     General: No focal deficit present.     Mental Status: He is  alert and oriented to person, place, and time.  Psychiatric:        Mood and Affect: Mood normal.        Behavior: Behavior normal.     ED Results / Procedures / Treatments   Labs (all labs ordered are listed, but only abnormal results are displayed) Labs Reviewed  COMPREHENSIVE METABOLIC PANEL WITH GFR - Abnormal; Notable for the following components:      Result Value   Potassium 3.3 (*)    Glucose, Bld 151 (*)    Calcium  8.6 (*)    Albumin 2.8 (*)    All other components within normal limits  BRAIN NATRIURETIC PEPTIDE - Abnormal; Notable for the following components:   B Natriuretic Peptide 130.0 (*)    All other components within normal limits  BLOOD GAS, VENOUS - Abnormal; Notable for the following components:   Bicarbonate 28.5 (*)    Acid-Base Excess 3.2 (*)    All other components within normal limits  CBC WITH DIFFERENTIAL/PLATELET - Abnormal; Notable for the following components:   Hemoglobin 11.9 (*)    HCT 38.4 (*)    MCH 25.9 (*)    RDW 17.9 (*)    All other components within normal limits  RESP PANEL BY RT-PCR (RSV, FLU A&B, COVID)  RVPGX2  MAGNESIUM   LACTIC ACID, PLASMA  TYPE AND SCREEN  TROPONIN I (HIGH SENSITIVITY)  TROPONIN I (HIGH SENSITIVITY)    EKG EKG Interpretation Date/Time:  Tuesday Jun 13 2023 00:25:41 EDT Ventricular Rate:  114 PR Interval:    QRS Duration:  101 QT Interval:  348 QTC Calculation: 480 R Axis:   -56  Text Interpretation: Atrial fibrillation Left anterior fascicular block Abnormal R-wave progression, late transition Borderline prolonged QT interval Confirmed by Iva Mariner ([id number]) on 06/13/2023 3:45:36 AM  Radiology CT Chest W Contrast Result Date: 06/13/2023 CLINICAL DATA:  Dizziness and tachycardia EXAM: CT CHEST WITH CONTRAST TECHNIQUE: Multidetector CT imaging of the chest was performed during intravenous contrast administration. RADIATION DOSE REDUCTION: This exam was performed according to the departmental  dose-optimization program which includes automated exposure control, adjustment of the mA and/or kV according to patient size and/or use of iterative reconstruction technique. CONTRAST:  75mL OMNIPAQUE  IOHEXOL  300 MG/ML  SOLN COMPARISON:  Plain film from earlier in the same day, CT from 06/05/2023 FINDINGS: Cardiovascular: Atherosclerotic calcifications of the thoracic aorta are noted. No aneurysmal dilatation or dissection is seen. The pulmonary artery as visualized is within normal limits although not timed for embolus evaluation. Attenuation of left upper lobe  pulmonary arterial branches is seen secondary to the known central left hilar mass. Coronary calcifications are noted. No cardiac enlargement is seen. Mediastinum/Nodes: The esophagus is within normal limits. Thoracic inlet is unremarkable. Scattered small mediastinal nodes are seen. Lungs/Pleura: Large left hilar mass lesion is noted which measures at least 10 cm in greatest dimension similar to that seen on the prior exam. It extends to the pleural margin. Attenuation of the left upper lobe bronchial tree as well as the left upper lobe pulmonary arteries is noted secondary to the mass. Associated pleural effusion is seen. The right lung remains clear. Upper Abdomen: Visualized upper abdomen shows a fatty lesion arising from the right adrenal gland measuring 7.1 cm. This is most consistent with a myelolipoma. Musculoskeletal: No acute rib abnormality is noted. Degenerative changes of the thoracic spine are seen. L1 compression fracture is noted. IMPRESSION: Stable appearing left hilar mass radiating to the pleural margin. The overall appearance is similar to that seen on the prior exam. Associated pleural effusion is noted. No definitive pulmonary emboli or aortic dissection is noted. Electronically Signed   By: Violeta Grey M.D.   On: 06/13/2023 03:31   DG Chest Port 1 View Result Date: 06/13/2023 CLINICAL DATA:  Dyspnea. EXAM: PORTABLE CHEST 1 VIEW  COMPARISON:  Jun 06, 2023 FINDINGS: The cardiac silhouette is mildly enlarged and unchanged in size. Mild to moderate severity atelectasis and/or infiltrate is seen within the left lung base. An ill-defined hazy opacity is seen overlying the left upper lobe. There is a small left pleural effusion. No pneumothorax is identified. Multilevel degenerative changes seen throughout the thoracic spine. IMPRESSION: 1. Mild to moderate severity left basilar atelectasis and/or infiltrate. 2. Small left pleural effusion. 3. Ill-defined hazy opacity overlying the left upper lobe which corresponds to the left upper lobe lung mass seen on prior chest CT (Jun 05, 2023). Electronically Signed   By: Virgle Grime M.D.   On: 06/13/2023 01:22    Procedures Procedures    Medications Ordered in ED Medications  norepinephrine (LEVOPHED) 4mg  in (0.016 mg/mL) premix infusion (0 mcg/min Intravenous Paused 06/13/23 0212)  iohexol  (OMNIPAQUE ) 300 MG/ML solution 75 mL (75 mLs Intravenous Contrast Given 06/13/23 0240)  potassium chloride  SA (KLOR-CON  M) CR tablet 40 mEq (40 mEq Oral Given 06/13/23 0406)  metoprolol  tartrate (LOPRESSOR ) tablet 25 mg (25 mg Oral Given 06/13/23 0406)  lactated ringers bolus 500 mL (0 mLs Intravenous Stopped 06/13/23 3086)    ED Course/ Medical Decision Making/ A&P                                 Medical Decision Making Amount and/or Complexity of Data Reviewed Labs: ordered. Radiology: ordered.  Risk Prescription drug management. Decision regarding hospitalization.   This patient presents to the ED for concern of weakness, dizziness, palpitations, cough, shortness of breath, this involves an extensive number of treatment options, and is a complaint that carries with it a high risk of complications and morbidity.  The differential diagnosis includes postprocedural complication, aspiration, pneumonia, CHF, arrhythmia, anemia, metabolic derangements   Co morbidities that complicate  the patient evaluation  alcohol abuse, lymphedema, atrial fibrillation, gout, GI bleeding, CVA   Additional history obtained:  Additional history obtained from patient's wife External records from outside source obtained and reviewed including EMR   Lab Tests:  I Ordered, and personally interpreted labs.  The pertinent results include: Baseline hemoglobin, no leukocytosis, normal  kidney function.  Hypokalemia is present with otherwise normal electrolytes.  Troponin is normal.   Imaging Studies ordered:  I ordered imaging studies including chest x-ray, CT chest I independently visualized and interpreted imaging which showed no acute findings.  Hilar mass radiating to pleural margin and pleural effusion are stable when compared to prior imaging. I agree with the radiologist interpretation   Cardiac Monitoring: / EKG:  The patient was maintained on a cardiac monitor.  I personally viewed and interpreted the cardiac monitored which showed an underlying rhythm of: Atrial fibrillation   Problem List / ED Course / Critical interventions / Medication management  Patient presents for acute onset of dizziness, fatigue, palpitations, shortness of breath, and worsening cough.  He also describes a scant amount of hemoptysis with his cough this evening.  This does follow a bronchoscopy with EBUS procedure that was performed earlier this morning.  On arrival in the ED, patient is ill-appearing.  He was placed on cardiac monitor.  Rhythm shows atrial fibrillation with RVR.  He was initially hypotensive.  Patient was tachypneic with shallow breathing.  On lung auscultation, right lateral rhonchi is present.  He was also found to be hypoxic on room air.  He was placed on 2 L of supplemental oxygen.  Patient currently denies any areas of discomfort.  Initially, Levophed and amiodarone were ordered.  Prior to initiation of amiodarone, patient's blood pressure and heart rate improved.  With this, he did  have improved symptoms as well.  He no longer appears flushed.  Amiodarone was held.  Workup was initiated.  Patient's lab work notable only for mild hypokalemia.  Patient underwent CT scan of chest which showed only redemonstration of prior findings.  There does not appear to be any acute findings.  Patient's blood pressure normalized while in the ED and he was taken off Levophed.  He remained on 2.5 L of supplemental oxygen with SpO2 of 100%.  His symptoms have resolved.  He was able to be weaned to room air.  Given his concerning vital signs on arrival, patient to be admitted for observation. I ordered medication including Levophed for hypotension; potassium chloride  for hypokalemia; metoprolol  for rate control; IV fluids for hydration Reevaluation of the patient after these medicines showed that the patient improved I have reviewed the patients home medicines and have made adjustments as needed   Social Determinants of Health:  Has access to outpatient care        Final Clinical Impression(s) / ED Diagnoses Final diagnoses:  Atrial fibrillation with RVR [facility])    Rx / DC Orders ED Discharge Orders     None         Iva Mariner, MD 06/13/23 [phone]

## 2023-06-13 NOTE — Care Management Obs Status (Signed)
 MEDICARE OBSERVATION STATUS NOTIFICATION   Patient Details  Name: HATEM PARLAPIANO MRN: [id number] Date of Birth: 19-Oct-1950   Medicare Observation Status Notification Given:  Yes    Geraldina Klinefelter, RN 06/13/2023, 6:03 PM

## 2023-06-13 NOTE — Plan of Care (Signed)

## 2023-06-13 NOTE — Evaluation (Signed)
 Occupational Therapy Evaluation Patient Details Name: Raymond Hurst MRN: [id number] DOB: 07/25/1950 Today's Date: 06/13/2023   History of Present Illness   HPI  Patient presents for dizziness and palpitations.  Medical history includes alcohol abuse, lymphedema, atrial fibrillation, gout, GI bleeding, CVA.  He is prescribed metoprolol , Lasix , and Eliquis .  He underwent bronchoscopy with EBUS earlier today for evaluation of a left hilar mass.  This was around 9 AM.  He was instructed to hold his Eliquis  but inadvertently took his evening dose.  He was doing well up until around 11:30 PM.  At that time, he developed dizziness, palpitations, lightheadedness, feeling flushed, and a worsening cough.  He states that he has coughed up a small amount of red blood.  He denies any current areas of pain.  He is not on oxygen at baseline. (per MD)     Clinical Impressions Pt agreeable to OT and PT co-evaluation. Pt appears to be at or near baseline for functional ADL's and mobility. BP taken at 101/74 with pt seated. Pt able to ambulate in the halls without assist. Independent for ADL's per clinical judgement. B UE generally weak but within functional limits. Pt is not recommended for further acute OT services and will be discharged to care of nursing staff for remaining length of stay.              Functional Status Assessment   Patient has not had a recent decline in their functional status     Equipment Recommendations   None recommended by OT     Recommendations for Other Services         Precautions/Restrictions   Precautions Precautions: None Recall of Precautions/Restrictions: Intact Restrictions Weight Bearing Restrictions Per Provider Order: No     Mobility Bed Mobility Overal bed mobility: Independent                  Transfers Overall transfer level: Independent                        Balance Overall balance assessment: Mild deficits observed,  not formally tested (mild LOB corrected by the pt due to his shoes catching on the floor.)                                         ADL either performed or assessed with clinical judgement   ADL Overall ADL's : Independent                                             Vision Baseline Vision/History: 1 Wears glasses Ability to See in Adequate Light: 1 Impaired Patient Visual Report: No change from baseline Vision Assessment?: No apparent visual deficits;Wears glasses for reading     Perception Perception: Not tested       Praxis Praxis: Not tested       Pertinent Vitals/Pain Pain Assessment Pain Assessment: No/denies pain     Extremity/Trunk Assessment Upper Extremity Assessment Upper Extremity Assessment: Generalized weakness;Overall [facility] for tasks assessed   Lower Extremity Assessment Lower Extremity Assessment: Defer to PT evaluation   Cervical / Trunk Assessment Cervical / Trunk Assessment: Normal   Communication Communication Communication: No apparent difficulties   Cognition Arousal: Alert Behavior During Therapy: [facility] for  tasks assessed/performed Cognition: No apparent impairments                               Following commands: Intact       Cueing  General Comments   Cueing Techniques: Verbal cues                 Home Living Family/patient expects to be discharged to:: Private residence Living Arrangements: Spouse/significant other Available Help at Discharge: Family;Available 24 hours/day Type of Home: House Home Access: Stairs to enter [employer] of Steps: 2 Entrance Stairs-Rails: None Home Layout: Two level Alternate Level Stairs-Number of Steps: 15 Alternate Level Stairs-Rails: Right Bathroom Shower/Tub: Chief Strategy Officer: Handicapped height Bathroom Accessibility: No   Home Equipment: Agricultural consultant (2 wheels)          Prior Functioning/Environment  Prior Level of Function : Independent/Modified Independent             Mobility Comments: Tourist information centre manager without AD ADLs Comments: Independent                            Co-evaluation PT/OT/SLP Co-Evaluation/Treatment: Yes Reason for Co-Treatment: To address functional/ADL transfers   OT goals addressed during session: ADL's and self-care      AM-PAC OT "6 Clicks" Daily Activity     Outcome Measure Help from another person eating meals?: None Help from another person taking care of personal grooming?: None Help from another person toileting, which includes using toliet, bedpan, or urinal?: None Help from another person bathing (including washing, rinsing, drying)?: None Help from another person to put on and taking off regular upper body clothing?: None Help from another person to put on and taking off regular lower body clothing?: None 6 Click Score: 24   End of Session Equipment Utilized During Treatment: Gait belt  Activity Tolerance: Patient tolerated treatment well Patient left: in chair;with call bell/phone within reach                   Time: 0820-0835 OT Time Calculation (min): 15 min Charges:  OT General Charges $OT Visit: 1 Visit OT Evaluation $OT Eval Low Complexity: 1 Low  Lokelani Lutes OT, MOT  Thurnell Floss 06/13/2023, 8:46 AM

## 2023-06-13 NOTE — Progress Notes (Addendum)
 TOC received consult for PCP/HH/DME. Pt reports he has a PCP. He has a walker at home if needed. PT/OT evaluated pt this morning and no follow up needed. Substance abuse resources added to AVS. TOC will follow.    06/13/23 1038  TOC Brief Assessment  Insurance and Status Reviewed  Patient has primary care physician Yes  Home environment has been reviewed Lives with wife.  Prior level of function: Independent.  Prior/Current Home Services No current home services  Social Drivers of Health Review SDOH reviewed no interventions necessary  Readmission risk has been reviewed Yes  Transition of care needs no transition of care needs at this time

## 2023-06-13 NOTE — Hospital Course (Addendum)
 Raymond Hurst is a 73 year old male with past medical history of alcohol abuse, lymphoma, atrial fibrillation, A-fib on Eliquis , gout, GI bleed, CVA. Presented with diminished dizziness and palpitation post bronchoscopy Patient apparently had a bronchoscopy for evaluation of lung mass around 9 AM on yesterday 06/12/2023 Later in the evening he progressed to have dizziness, and palpitation.. He apparently was prescribed metoprolol , Lasix  and Eliquis  which she was told to hold for the procedure.  He invertedly took 1 dose of Eliquis  p.m.   Evaluation/course: Upon arrival patient was hypotensive BP as low as 76/43, started on Levophed, VF Heart rate 123-metoprolol  was given  Blood pressure 100/61, pulse 78, temperature 98.1 F (36.7 C), resp. rate 14, height 6' (1.829 m), weight 133 kg, SpO2 94%.  CBC WBC 7.3, hemoglobin 11.9, BMP sodium 137, potassium 3.3, BUN 9, creatinine 0.83, calcium  8.7, troponin 6, 7, glucose 151, BNP 130

## 2023-06-13 NOTE — H&P (Addendum)
 History and Physical    Patient: Raymond Hurst UXN:[id number] DOB: 1950/04/20 DOA: 06/13/2023 DOS: the patient was seen and examined on 06/13/2023 PCP: [facility]  Patient coming from: Home  Chief Complaint:  Chief Complaint  Patient presents with   Palpitations   HPI: Raymond Hurst is a 73 y.o. male with medical history significant of  lymphedema, atrial fibrillation for which he is on chronic anticoagulation as well as lymphedema for which he takes Lasix  at home, gout, GI bleeding, and alcohol abuse. He was recently admitted 06/05/23-06/07/23 admitted for shortness of breath in the setting of known Left hilar lung mass. He underwent bronchoscopy with biopsy for that mass yesterday, 06/12/23. He presented to [facility] ED shortly after midnight reporting palpitations and dizziness. Endorses cough with some blood in sputum that started after the bronchoscopy yesterday and dyspnea.  ED Course: On arrival to [facility] ED patient was noted to be ill-appearing and afebrile temp 36.5 C, BP 88/49, HR 121, RR 17, SpO2 89% on room air.  Labs notable for Potassium of 3.3, albumin 2.8 which is in line with his recent baseline, BNP 130 which is Improved from 166.2 8 days ago, hemoglobin 11.9, no leukocytosis, Negative lactic acid, magnesium  1.9, troponin negative x 2.  CXR obtained and shows This left basilar atelectasis/infiltrate with small left pleural effusion as well as a hazy opacity overlying the left upper lung which corresponds to the lung mass seen on prior CT.  CT chest Shows stable left hilar mass radiating to the left pleural margin unchanged from prior.  No definitive PE or aortic dissection noted on CT chest.  He was given 500 mL LR bolus, metoprolol  25 mg, and 40 mEQ K. TRH contacted for admission.   Review of Systems: As mentioned in the history of present illness. All other systems reviewed and are negative. Past Medical History:  Diagnosis Date   Adrenal tumor     right adrenal gland tumor favored to be a myelolipoma   Atrial fibrillation (HCC)    Cellulitis of right lower leg    Dysrhythmia    A-fib   History of TIA (transient ischemic attack)    May 2020   Lymphedema    OSA on CPAP    severe obstructive sleep apnea with an AHI of 54/h with no significant central events.  He had nocturnal hypoxemia with O2 sat nadir of 70%. Now on CPAP   Pulmonary mass    Stroke [facility])    Past Surgical History:  Procedure Laterality Date   INNER EAR SURGERY     KNEE SURGERY Left    THORACENTESIS Left 06/06/2023   Procedure: THORACENTESIS;  Surgeon: Arlina Lair, MD;  Location: [facility] ENDOSCOPY;  Service: Pulmonary;  Laterality: Left;   TONSILLECTOMY     Social History:  reports that he quit smoking about 33 years ago. His smoking use included cigarettes. He started smoking about 53 years ago. He has a 40 pack-year smoking history. He has never used smokeless tobacco. He reports current alcohol use. He reports that he does not use drugs.  No Known Allergies  Family History  Problem Relation Age of Onset   Heart failure Mother    COPD Mother    Atrial fibrillation Mother    Idiopathic pulmonary fibrosis Father     Prior to Admission medications   Medication Sig Start Date End Date Taking? Authorizing Provider  acetaminophen  (TYLENOL ) 500 MG tablet Take 500 mg by mouth every  6 (six) hours as needed for mild pain.    [provider]  allopurinol  (ZYLOPRIM ) 100 MG tablet Take 100 mg by mouth daily. 05/25/22   [provider]  apixaban  (ELIQUIS ) 5 MG TABS tablet Take 1 tablet (5 mg total) by mouth 2 (two) times daily. Okay to restart this medication on 06/13/2023 06/12/23   Denson Flake, MD  Cholecalciferol  (VITAMIN D ) 125 MCG (5000 UT) CAPS Take 5,000 Units by mouth daily.    [provider]  Cholecalciferol  50 MCG (2000 UT) TABS 2 tablet Orally Once a day    [provider]  Cyanocobalamin  (VITAMIN B 12 PO) Take 1 tablet by  mouth daily.    [provider]  furosemide  (LASIX ) 40 MG tablet Take 1 tablet (40 mg total) by mouth daily. 05/31/23   [name], NP  metoprolol  tartrate (LOPRESSOR ) 25 MG tablet Take 1 tablet (25 mg total) by mouth 2 (two) times daily. 11/17/21   Dorma Gash, PA-C    Physical Exam: Vitals:   06/13/23 0800 06/13/23 0847 06/13/23 0857 06/13/23 0900  BP: 103/65  (!) 103/56 104/69  Pulse: 79  82 81  Resp: 14  (!) 23   Temp:    98 F (36.7 C)  TempSrc:    Oral  SpO2: 97%  97%   Weight:   134.9 kg   Height:  6' (1.829 m) 6' (1.829 m)    Constitutional: NAD, calm, elderly caucasian gentleman Eyes: PERRL, lids and conjunctivae normal ENMT: Mucous membranes are moist. Posterior pharynx clear of any exudate or lesions. Neck: normal, supple, no masses, no thyromegaly Respiratory: Rhonchi present in bilateral upper lobes, no wheezing, no crackles. Normal respiratory effort. No accessory muscle use.  Cardiovascular: Regular rate and irregular rhythm, no murmurs / rubs / gallops. Bilateral lower extremity edema. 2+ radial and pedal pulses.   Abdomen: no tenderness, no masses palpated. No hepatosplenomegaly. Bowel sounds positive x 4 quadrants.  Musculoskeletal: no clubbing / cyanosis. No joint deformity upper and lower extremities. Good ROM, no contractures. Normal muscle tone.  Skin: Warm, dry. No rashes, lesions, ulcers.  Neurologic: CN 2-12 grossly intact.  Alert and oriented x 3. Normal mood.   Data Reviewed: CMP     Component Value Date/Time   NA 137 06/13/2023 0031   NA 137 06/01/2023 1439   K 3.3 (L) 06/13/2023 0031   CL 103 06/13/2023 0031   CO2 24 06/13/2023 0031   GLUCOSE 151 (H) 06/13/2023 0031   BUN 9 06/13/2023 0031   BUN 9 06/01/2023 1439   CREATININE 0.83 06/13/2023 0031   CALCIUM  8.6 (L) 06/13/2023 0031   PROT 7.0 06/13/2023 0031   PROT 6.2 06/01/2023 1439   ALBUMIN 2.8 (L) 06/13/2023 0031   ALBUMIN 3.0 (L) 06/01/2023 1439   AST 17 06/13/2023  0031   ALT 12 06/13/2023 0031   ALKPHOS 61 06/13/2023 0031   BILITOT 0.5 06/13/2023 0031   BILITOT 0.5 06/01/2023 1439   EGFR 97 06/01/2023 1439   GFRNONAA >60 06/13/2023 0031   CBC    Component Value Date/Time   WBC 7.3 06/13/2023 0031   RBC 4.59 06/13/2023 0031   HGB 11.9 (L) 06/13/2023 0031   HGB 11.4 (L) 06/01/2023 1439   HCT 38.4 (L) 06/13/2023 0031   HCT 36.9 (L) 06/01/2023 1439   PLT 284 06/13/2023 0031   PLT 231 06/01/2023 1439   MCV 83.7 06/13/2023 0031   MCV 84 06/01/2023 1439   MCH 25.9 (L) 06/13/2023  0031   MCHC 31.0 06/13/2023 0031   RDW 17.9 (H) 06/13/2023 0031   RDW 16.8 (H) 06/01/2023 1439   LYMPHSABS 1.9 06/13/2023 0031   LYMPHSABS 1.0 06/01/2023 1439   MONOABS 0.3 06/13/2023 0031   EOSABS 0.0 06/13/2023 0031   EOSABS 0.2 06/01/2023 1439   BASOSABS 0.0 06/13/2023 0031   BASOSABS 0.0 06/01/2023 1439   BNP    Component Value Date/Time   BNP 130.0 (H) 06/13/2023 0031    Cardiac Panel (last 3 results) Recent Labs    06/13/23 0031 06/13/23 0224  TROPONINIHS 6 7   Magnesium     Component Value Date/Time   MAGNESIUM  1.9 06/13/2023 0031    PT/INR    Component Value Date/Time   PROTHROMBIN TIME INR 14.2 1.1 06/13/2023 0031 06/13/2023 0031   Venous Blood Gas result:  pO2 39; pCO2 44; pH 7.42;  HCO3 28.5, %O2 Sat 69.4.  Lactic Acid, Venous    Component Value Date/Time   LATICACIDVEN 1.2 06/13/2023 5366   Results for orders placed or performed during the hospital encounter of 06/13/23  Resp panel by RT-PCR (RSV, Flu A&B, Covid) Anterior Nasal Swab     Status: None   Collection Time: 06/13/23 12:49 AM   Specimen: Anterior Nasal Swab  Result Value Ref Range Status   SARS Coronavirus 2 by RT PCR NEGATIVE NEGATIVE Final    Comment: (NOTE) SARS-CoV-2 target nucleic acids are NOT DETECTED.  The SARS-CoV-2 RNA is generally detectable in upper respiratory specimens during the acute phase of infection. The lowest concentration of SARS-CoV-2  viral copies this assay can detect is 138 copies/mL. A negative result does not preclude SARS-Cov-2 infection and should not be used as the sole basis for treatment or other patient management decisions. A negative result may occur with  improper specimen collection/handling, submission of specimen other than nasopharyngeal swab, presence of viral mutation(s) within the areas targeted by this assay, and inadequate number of viral copies(<138 copies/mL). A negative result must be combined with clinical observations, patient history, and epidemiological information. The expected result is Negative.  Fact Sheet for Patients:  BloggerCourse.com  Fact Sheet for Healthcare Providers:  SeriousBroker.it  This test is no t yet approved or cleared by the [place]  FDA and  has been authorized for detection and/or diagnosis of SARS-CoV-2 by FDA under an Emergency Use Authorization (EUA). This EUA will remain  in effect (meaning this test can be used) for the duration of the COVID-19 declaration under Section 564(b)(1) of the Act, 21 U.S.C.section 360bbb-3(b)(1), unless the authorization is terminated  or revoked sooner.       Influenza A by PCR NEGATIVE NEGATIVE Final   Influenza B by PCR NEGATIVE NEGATIVE Final    Comment: (NOTE) The Xpert Xpress SARS-CoV-2/FLU/RSV plus assay is intended as an aid in the diagnosis of influenza from Nasopharyngeal swab specimens and should not be used as a sole basis for treatment. Nasal washings and aspirates are unacceptable for Xpert Xpress SARS-CoV-2/FLU/RSV testing.  Fact Sheet for Patients: BloggerCourse.com  Fact Sheet for Healthcare Providers: SeriousBroker.it  This test is not yet approved or cleared by the [place]  FDA and has been authorized for detection and/or diagnosis of SARS-CoV-2 by FDA under an Emergency Use Authorization  (EUA). This EUA will remain in effect (meaning this test can be used) for the duration of the COVID-19 declaration under Section 564(b)(1) of the Act, 21 U.S.C. section 360bbb-3(b)(1), unless the authorization is terminated or revoked.     Resp Syncytial Virus  by PCR NEGATIVE NEGATIVE Final    Comment: (NOTE) Fact Sheet for Patients: BloggerCourse.com  Fact Sheet for Healthcare Providers: SeriousBroker.it  This test is not yet approved or cleared by the [place]  FDA and has been authorized for detection and/or diagnosis of SARS-CoV-2 by FDA under an Emergency Use Authorization (EUA). This EUA will remain in effect (meaning this test can be used) for the duration of the COVID-19 declaration under Section 564(b)(1) of the Act, 21 U.S.C. section 360bbb-3(b)(1), unless the authorization is terminated or revoked.  Performed at [facility], [address], [place], [place] [address]    CT Chest W Contrast Result Date: 06/13/2023 CLINICAL DATA:  Dizziness and tachycardia EXAM: CT CHEST WITH CONTRAST TECHNIQUE: Multidetector CT imaging of the chest was performed during intravenous contrast administration. RADIATION DOSE REDUCTION: This exam was performed according to the departmental dose-optimization program which includes automated exposure control, adjustment of the mA and/or kV according to patient size and/or use of iterative reconstruction technique. CONTRAST:  75mL OMNIPAQUE  IOHEXOL  300 MG/ML  SOLN COMPARISON:  Plain film from earlier in the same day, CT from 06/05/2023 FINDINGS: Cardiovascular: Atherosclerotic calcifications of the thoracic aorta are noted. No aneurysmal dilatation or dissection is seen. The pulmonary artery as visualized is within normal limits although not timed for embolus evaluation. Attenuation of left upper lobe pulmonary arterial branches is seen secondary to the known central left hilar mass. Coronary  calcifications are noted. No cardiac enlargement is seen. Mediastinum/Nodes: The esophagus is within normal limits. Thoracic inlet is unremarkable. Scattered small mediastinal nodes are seen. Lungs/Pleura: Large left hilar mass lesion is noted which measures at least 10 cm in greatest dimension similar to that seen on the prior exam. It extends to the pleural margin. Attenuation of the left upper lobe bronchial tree as well as the left upper lobe pulmonary arteries is noted secondary to the mass. Associated pleural effusion is seen. The right lung remains clear. Upper Abdomen: Visualized upper abdomen shows a fatty lesion arising from the right adrenal gland measuring 7.1 cm. This is most consistent with a myelolipoma. Musculoskeletal: No acute rib abnormality is noted. Degenerative changes of the thoracic spine are seen. L1 compression fracture is noted. IMPRESSION: Stable appearing left hilar mass radiating to the pleural margin. The overall appearance is similar to that seen on the prior exam. Associated pleural effusion is noted. No definitive pulmonary emboli or aortic dissection is noted. Electronically Signed   By: Violeta Grey M.D.   On: 06/13/2023 03:31   DG Chest Port 1 View Result Date: 06/13/2023 CLINICAL DATA:  Dyspnea. EXAM: PORTABLE CHEST 1 VIEW COMPARISON:  Jun 06, 2023 FINDINGS: The cardiac silhouette is mildly enlarged and unchanged in size. Mild to moderate severity atelectasis and/or infiltrate is seen within the left lung base. An ill-defined hazy opacity is seen overlying the left upper lobe. There is a small left pleural effusion. No pneumothorax is identified. Multilevel degenerative changes seen throughout the thoracic spine. IMPRESSION: 1. Mild to moderate severity left basilar atelectasis and/or infiltrate. 2. Small left pleural effusion. 3. Ill-defined hazy opacity overlying the left upper lobe which corresponds to the left upper lobe lung mass seen on prior chest CT (Jun 05, 2023).  Electronically Signed   By: Virgle Grime M.D.   On: 06/13/2023 01:22      Assessment and Plan: ##Atrial Fibrillation with Rapid Ventricular Response HR now 70s-80s with BP low 100s/60s - Continue home Eliquis  - Hold Metoprolol  in setting of hypotension  ##Hypotension -  Levophed for MAP goal >65 - s/p Lopressor  25 mg this AM - Clinical picture not consistent with Septic, Cardiogenic, or Hypovolemic shock. No tension pneumothorax seen on imaging, patient not in respiratory distress on exam. - Will check D-dimer to rule out Obstructive patho with Eliquis  recently held for bronchoscopy  ##Hypokalemia - Repleted in ED - Replete PRN  #Lung Mass - S/p Bronchoscopy with Biopsy with Pulmonology yesterday - Follow up outpatient with Pulmonology  #Tobacco Use Disorder - CIWA protocol with PRN Ativan   #Chronic Lymphedema - TED hose - Hold home Lasix  in setting of Hypotension  #Obesity Body mass index is 40.33 kg/m.   Advance Care Planning:   Code Status: Full Code   Consults: N/A  Family Communication: Wife, [name] updated via phone  Severity of Illness: The appropriate patient status for this patient is OBSERVATION. Observation status is judged to be reasonable and necessary in order to provide the required intensity of service to ensure the patient's safety. The patient's presenting symptoms, physical exam findings, and initial radiographic and laboratory data in the context of their medical condition is felt to place them at decreased risk for further clinical deterioration. Furthermore, it is anticipated that the patient will be medically stable for discharge from the hospital within 2 midnights of admission.   To reach the provider On-Call:   7AM- 7PM see care teams to locate the attending and reach out to them via [email or web address]. Password: [email or web address] 7PM-7AM contact night-coverage If you still have difficulty reaching the appropriate provider, please page the [facility] (Director on  Call) for [facility] on amion for assistance  This document was prepared using Conservation officer, historic buildings and may include unintentional dictation errors.  Naida Austria FNP-BC, PMHNP-BC Nurse Practitioner [facility] [place]    The patient was seen and examined independently plan of care with [place], discussed with NP in detail.   Patient briefly needed pressors, for hypotension, A-fib with RVR At this point admitted to ICU for close observation on pressors. Critical care time 55 minutes was spent   SIGNED: Bobbetta Burnet, MD, FHM. FAAFP [facility],  Pager (please use [email or web address] to page/text)  Please use Epic Secure Chat for non-urgent communication (7AM-7PM) If 7PM-7AM, please contact night-coverage [email or web address],  06/13/2023, 10:57 AM

## 2023-06-13 NOTE — Care Management Obs Status (Deleted)
 MEDICARE OBSERVATION STATUS NOTIFICATION   Patient Details  Name: Raymond Hurst MRN: [id number] Date of Birth: 01-25-1951   Medicare Observation Status Notification Given:       Geraldina Klinefelter, RN 06/13/2023, 6:02 PM

## 2023-06-14 ENCOUNTER — Encounter (HOSPITAL_COMMUNITY): Payer: Self-pay | Admitting: Emergency Medicine

## 2023-06-14 DIAGNOSIS — G4733 Obstructive sleep apnea (adult) (pediatric): Secondary | ICD-10-CM | POA: Diagnosis not present

## 2023-06-14 DIAGNOSIS — I4891 Unspecified atrial fibrillation: Secondary | ICD-10-CM | POA: Diagnosis not present

## 2023-06-14 DIAGNOSIS — R918 Other nonspecific abnormal finding of lung field: Secondary | ICD-10-CM | POA: Diagnosis not present

## 2023-06-14 DIAGNOSIS — E538 Deficiency of other specified B group vitamins: Secondary | ICD-10-CM | POA: Diagnosis not present

## 2023-06-14 LAB — BASIC METABOLIC PANEL WITH GFR
Anion gap: 8 (ref 5–15)
BUN: 5 mg/dL — ABNORMAL LOW (ref 8–23)
CO2: 26 mmol/L (ref 22–32)
Calcium: 8.5 mg/dL — ABNORMAL LOW (ref 8.9–10.3)
Chloride: 104 mmol/L (ref 98–111)
Creatinine, Ser: 0.67 mg/dL (ref 0.61–1.24)
GFR, Estimated: >60 mL/min (ref >60)
Glucose, Bld: 99 mg/dL (ref 70–99)
Potassium: 4.1 mmol/L (ref 3.5–5.1)
Sodium: 138 mmol/L (ref 135–145)

## 2023-06-14 LAB — CBC
HCT: 36.6 % — ABNORMAL LOW (ref 39.0–52.0)
Hemoglobin: 11.2 g/dL — ABNORMAL LOW (ref 13.0–17.0)
MCH: 26 pg (ref 26.0–34.0)
MCHC: 30.6 g/dL (ref 30.0–36.0)
MCV: 85.1 fL (ref 80.0–100.0)
Platelets: 219 10*3/uL (ref 150–400)
RBC: 4.3 MIL/uL (ref 4.22–5.81)
RDW: 18.1 % — ABNORMAL HIGH (ref 11.5–15.5)
WBC: 6 10*3/uL (ref 4.0–10.5)
nRBC: 0 % (ref 0.0–0.2)

## 2023-06-14 LAB — PROTIME-INR
INR: 1.3 — ABNORMAL HIGH (ref 0.8–1.2)
Prothrombin Time: 16.4 s — ABNORMAL HIGH (ref 11.4–15.2)

## 2023-06-14 LAB — APTT: aPTT: 32 s (ref 24–36)

## 2023-06-14 NOTE — Plan of Care (Signed)

## 2023-06-14 NOTE — Discharge Summary (Signed)
 Physician Discharge Summary   Patient: Raymond Hurst MRN: [id number] DOB: 03/16/1950  Admit date:     06/13/2023  Discharge date: 06/14/23  Discharge Physician: Raymond Hurst   PCP: [facility]   Recommendations at discharge:   Please follow up with primary care provider within 1-2 weeks  Please repeat BMP and CBC in one week    Hospital Course: [name] is a 73 year old male with past medical history of alcohol abuse, lymphedema, permanent atrial fibrillation, A-fib on Eliquis , gout, GI bleed, CVA. Presented with dizziness and palpitation post bronchoscopy Patient apparently had a bronchoscopy for evaluation of lung mass around 9 AM on yesterday 06/12/2023 Later in the evening he progressed to have dizziness, and palpitation.. He apparently was prescribed metoprolol , Lasix  and Eliquis  which she was told to hold for the procedure.  He invertedly took 1 dose of Eliquis  p.m. glucose 151, BNP 130 Hemoglobin 11.9, WBC 7.3, platelets 284.  Sodium 137, potassium 3.3, bicarbonate 24, serum creatinine 0.83.  EKG showed atrial fibrillation with RVR and nonspecific ST changes.  Troponin 6>>7 Lactic acid 1.2.  INR 1.3.  CT of the chest showed stable left hilar mass radiating to the pleural margin.  There is no PE or dissection.  Assessment and Plan: ##permanent Atrial Fibrillation with Rapid Ventricular Response  - Continue home Eliquis  -Patient had been instructed to hold his metoprolol  for the his procedure which may have contributed to his RVR in the setting of hypokalemia -afib rate controlled after pt came to medical floor; no further RVR overnight   ##Hypotension - Levophed for MAP goal >65 -Patient required Levophed for about an hour - Clinical picture not consistent with Septic, Cardiogenic, or Hypovolemic shock. No tension pneumothorax seen on imaging, patient not in respiratory distress on exam. - CT chest negative for PE or dissection -Patient was  monitored overnight and did not have any further hypotension off levophed -The patient's metoprolol  was restarted without difficulty   ##Hypokalemia - Presented with potassium 3.3 - Repleted   #Lung Mass - S/p Bronchoscopy with Biopsy with Pulmonology yesterday - Follow up outpatient with Pulmonology   #Tobacco Use Disorder - Cessation discussed  # Alcohol abuse -no signs of withdraw   #Chronic Lymphedema - TED hose - Hold home Lasix  in setting of Hypotension - restart lasix  after d/c   #morbid Obesity Body mass index is 40.33 kg/m.        Consultants: none Procedures performed: none  Disposition: Home Diet recommendation:  Cardiac diet DISCHARGE MEDICATION: Allergies as of 06/14/2023   No Known Allergies      Medication List     TAKE these medications    acetaminophen  500 MG tablet Commonly known as: TYLENOL  Take 500 mg by mouth every 6 (six) hours as needed for mild pain.   allopurinol  100 MG tablet Commonly known as: ZYLOPRIM  Take 100 mg by mouth daily.   apixaban  5 MG Tabs tablet Commonly known as: ELIQUIS  Take 1 tablet (5 mg total) by mouth 2 (two) times daily. Okay to restart this medication on 06/13/2023   furosemide  40 MG tablet Commonly known as: LASIX  Take 1 tablet (40 mg total) by mouth daily.   metoprolol  tartrate 25 MG tablet Commonly known as: LOPRESSOR  Take 1 tablet (25 mg total) by mouth 2 (two) times daily.        Discharge Exam: Filed Weights   06/13/23 0019 06/13/23 0857  Weight: 133 kg 134.9 kg   HEENT:  [place]/AT, No thrush, no icterus CV:  IRRR, no rub, no S3, no S4 Lung:  CTA, no wheeze, no rhonchi Abd:  soft/+BS, NT Ext:  2 + edema, no lymphangitis, no synovitis, no rash   Condition at discharge: stable  The results of significant diagnostics from this hospitalization (including imaging, microbiology, ancillary and laboratory) are listed below for reference.   Imaging Studies: CT Chest W Contrast Result Date:  06/13/2023 CLINICAL DATA:  Dizziness and tachycardia EXAM: CT CHEST WITH CONTRAST TECHNIQUE: Multidetector CT imaging of the chest was performed during intravenous contrast administration. RADIATION DOSE REDUCTION: This exam was performed according to the departmental dose-optimization program which includes automated exposure control, adjustment of the mA and/or kV according to patient size and/or use of iterative reconstruction technique. CONTRAST:  75mL OMNIPAQUE  IOHEXOL  300 MG/ML  SOLN COMPARISON:  Plain film from earlier in the same day, CT from 06/05/2023 FINDINGS: Cardiovascular: Atherosclerotic calcifications of the thoracic aorta are noted. No aneurysmal dilatation or dissection is seen. The pulmonary artery as visualized is within normal limits although not timed for embolus evaluation. Attenuation of left upper lobe pulmonary arterial branches is seen secondary to the known central left hilar mass. Coronary calcifications are noted. No cardiac enlargement is seen. Mediastinum/Nodes: The esophagus is within normal limits. Thoracic inlet is unremarkable. Scattered small mediastinal nodes are seen. Lungs/Pleura: Large left hilar mass lesion is noted which measures at least 10 cm in greatest dimension similar to that seen on the prior exam. It extends to the pleural margin. Attenuation of the left upper lobe bronchial tree as well as the left upper lobe pulmonary arteries is noted secondary to the mass. Associated pleural effusion is seen. The right lung remains clear. Upper Abdomen: Visualized upper abdomen shows a fatty lesion arising from the right adrenal gland measuring 7.1 cm. This is most consistent with a myelolipoma. Musculoskeletal: No acute rib abnormality is noted. Degenerative changes of the thoracic spine are seen. L1 compression fracture is noted. IMPRESSION: Stable appearing left hilar mass radiating to the pleural margin. The overall appearance is similar to that seen on the prior exam.  Associated pleural effusion is noted. No definitive pulmonary emboli or aortic dissection is noted. Electronically Signed   By: Violeta Grey M.D.   On: 06/13/2023 03:31   DG Chest Port 1 View Result Date: 06/13/2023 CLINICAL DATA:  Dyspnea. EXAM: PORTABLE CHEST 1 VIEW COMPARISON:  Jun 06, 2023 FINDINGS: The cardiac silhouette is mildly enlarged and unchanged in size. Mild to moderate severity atelectasis and/or infiltrate is seen within the left lung base. An ill-defined hazy opacity is seen overlying the left upper lobe. There is a small left pleural effusion. No pneumothorax is identified. Multilevel degenerative changes seen throughout the thoracic spine. IMPRESSION: 1. Mild to moderate severity left basilar atelectasis and/or infiltrate. 2. Small left pleural effusion. 3. Ill-defined hazy opacity overlying the left upper lobe which corresponds to the left upper lobe lung mass seen on prior chest CT (Jun 05, 2023). Electronically Signed   By: Virgle Grime M.D.   On: 06/13/2023 01:22   DG Chest 2 View Result Date: 06/06/2023 CLINICAL DATA:  Status post thoracentesis EXAM: CHEST - 2 VIEW COMPARISON:  Jun 01, 2023 FINDINGS: Post thoracentesis without evidence of pneumothorax No change in the left upper lobe mass lesion compared with prior chest x-ray and prior CT Jun 05, 2023. Right lung clear Heart normal size IMPRESSION: Post thoracentesis without evidence of pneumothorax Electronically Signed   By: Evorn Holder.D.  On: 06/06/2023 15:59   CT Angio Chest PE W/Cm &/Or Wo Cm Result Date: 06/05/2023 CLINICAL DATA:  Shortness of breath.  Left upper lobe mass. * Tracking Code: BO * EXAM: CT ANGIOGRAPHY CHEST WITH CONTRAST TECHNIQUE: Multidetector CT imaging of the chest was performed using the standard protocol during bolus administration of intravenous contrast. Multiplanar CT image reconstructions and MIPs were obtained to evaluate the vascular anatomy. RADIATION DOSE REDUCTION: This exam was performed  according to the departmental dose-optimization program which includes automated exposure control, adjustment of the mA and/or kV according to patient size and/or use of iterative reconstruction technique. CONTRAST:  75mL OMNIPAQUE  IOHEXOL  350 MG/ML SOLN COMPARISON:  Chest radiograph 06/01/2023 FINDINGS: Despite efforts by the technologist and patient, motion artifact is present on today's exam and could not be eliminated. This reduces exam sensitivity and specificity. Cardiovascular: Left upper lobe branches of the left pulmonary artery are effaced or occluded due to a 10.9 by 5.6 by 7.2 cm left upper lobe mass which invades the aorta pulmonary window and mediastinum. This also causes narrowing of the left lower lobe pulmonary artery and pulmonary artery invasion by tumor is not completely excluded. Mass abuts the left lateral pleural margin and obviously extends across the mediastinal pleural margin. There is a moderate left pleural effusion malignant effusion is a distinct possibility. Separate from this mass effect causing narrowing and occlusion of portions of the left upper lobe pulmonary artery, I do not see a separate pulmonary embolus. Lower lobe pulmonary arteries are somewhat obscured by motion artifact. Coronary, aortic arch, and branch vessel atherosclerotic vascular disease. Moderate cardiomegaly. Mediastinum/Nodes: 1.1 cm right paratracheal lymph node on image 41 series 5. Additional clustered right lower paratracheal lymph nodes are present. The dominant left upper lobe mass invades the mediastinum in the vicinity of the hilum and AP window. Along the posterior mediastinum, there are hypodense nodules in the paraspinal location especially along the right, query extramedullary hematopoiesis. Lungs/Pleura: Occlusion of the left superior lobar bronchus by the large left upper lobe mass. The lingular bronchus is narrowed but not occluded. Patchy ground-glass opacities in the remainder of the left upper  lobe and left lower lobe with passive atelectasis of much of the left lower lobe. Hazy ground-glass density in the right lung is present as well, raising the possibility of pulmonary edema. Upper Abdomen: 7.1 by 4.7 cm right adrenal mass with some fatty elements favoring myelolipoma. Musculoskeletal: Thoracic spondylosis. Lucent lesion along the right scapular neck is probably benign. Review of the MIP images confirms the above findings. IMPRESSION: 1. 10.9 by 5.6 by 7.2 cm left upper lobe mass which invades the mediastinum in the vicinity of the hilum and AP window. This causes effacement or occlusion of portions of the left upper lobe pulmonary artery and narrowing of the left lower lobe pulmonary artery. Pulmonary artery invasion by tumor is not completely excluded. The mass is compatible with bronchogenic carcinoma. Pulmonology and oncology consultations may be indicated for tissue sampling further workup. 2. Moderate left pleural effusion, malignant effusion is a distinct possibility. 3. No obvious filling defect in the visible pulmonary arterial tree to indicate typical pulmonary embolus, with the understanding that part of the left upper lobe pulmonary arterial tree is occluded by the mass, and portions of the lower lobe pulmonary arterial tree bilaterally are obscured by motion artifact. 4. Patchy ground-glass opacities in the remainder of the left upper lobe and left lower lobe with passive atelectasis of much of the left lower lobe. Hazy ground-glass  density in the right lung is present as well, raising the possibility of pulmonary edema. 5. Moderate cardiomegaly. 6. Coronary, aortic arch, and branch vessel atherosclerotic vascular disease. Aortic Atherosclerosis (ICD10-I70.0). 7. 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 8. Lucent lesion along the right scapular neck is probably benign. Electronically Signed   By: Freida Jes M.D.   On: 06/05/2023 17:44   DG  Chest 2 View Result Date: 06/01/2023 CLINICAL DATA:  Cough for several weeks EXAM: CHEST - 2 VIEW COMPARISON:  04/21/2020 FINDINGS: Frontal and lateral views of the chest demonstrate an enlarged cardiac silhouette. There is a large masslike area of consolidation within the left upper lobe along the major fissure, measuring up to 9.1 cm in size. This could reflect pneumonia in the appropriate clinical setting, and close follow-up after medical management is recommended to document resolution and exclude underlying mass. Increased density at the left hilum may reflect adenopathy. Small left pleural effusion. Right chest is clear. No acute bony abnormalities. IMPRESSION: 1. Large area of masslike consolidation within the left upper lobe, with associated left hilar prominence suggesting underlying adenopathy. While pneumonia could give this appearance, the radiographic pattern is somewhat concerning for underlying lung mass. If the patient has current signs or symptoms of infection, close imaging follow-up after appropriate medical management is recommended to document resolution. If there are no current signs of infection, CT chest with IV contrast is recommended to assess for underlying neoplasm. 2. Small left pleural effusion. These results will be called to the ordering clinician or representative by the Radiologist Assistant, and communication documented in the PACS or [employer]. Electronically Signed   By: Bobbye Burrow M.D.   On: 06/01/2023 15:17    Microbiology: Results for orders placed or performed during the hospital encounter of 06/13/23  Resp panel by RT-PCR (RSV, Flu A&B, Covid) Anterior Nasal Swab     Status: None   Collection Time: 06/13/23 12:49 AM   Specimen: Anterior Nasal Swab  Result Value Ref Range Status   SARS Coronavirus 2 by RT PCR NEGATIVE NEGATIVE Final    Comment: (NOTE) SARS-CoV-2 target nucleic acids are NOT DETECTED.  The SARS-CoV-2 RNA is generally detectable in  upper respiratory specimens during the acute phase of infection. The lowest concentration of SARS-CoV-2 viral copies this assay can detect is 138 copies/mL. A negative result does not preclude SARS-Cov-2 infection and should not be used as the sole basis for treatment or other patient management decisions. A negative result may occur with  improper specimen collection/handling, submission of specimen other than nasopharyngeal swab, presence of viral mutation(s) within the areas targeted by this assay, and inadequate number of viral copies(<138 copies/mL). A negative result must be combined with clinical observations, patient history, and epidemiological information. The expected result is Negative.  Fact Sheet for Patients:  BloggerCourse.com  Fact Sheet for Healthcare Providers:  SeriousBroker.it  This test is no t yet approved or cleared by the [place]  FDA and  has been authorized for detection and/or diagnosis of SARS-CoV-2 by FDA under an Emergency Use Authorization (EUA). This EUA will remain  in effect (meaning this test can be used) for the duration of the COVID-19 declaration under Section 564(b)(1) of the Act, 21 U.S.C.section 360bbb-3(b)(1), unless the authorization is terminated  or revoked sooner.       Influenza A by PCR NEGATIVE NEGATIVE Final   Influenza B by PCR NEGATIVE NEGATIVE Final    Comment: (NOTE) The Xpert  Xpress SARS-CoV-2/FLU/RSV plus assay is intended as an aid in the diagnosis of influenza from Nasopharyngeal swab specimens and should not be used as a sole basis for treatment. Nasal washings and aspirates are unacceptable for Xpert Xpress SARS-CoV-2/FLU/RSV testing.  Fact Sheet for Patients: BloggerCourse.com  Fact Sheet for Healthcare Providers: SeriousBroker.it  This test is not yet approved or cleared by the [place]  FDA and has been  authorized for detection and/or diagnosis of SARS-CoV-2 by FDA under an Emergency Use Authorization (EUA). This EUA will remain in effect (meaning this test can be used) for the duration of the COVID-19 declaration under Section 564(b)(1) of the Act, 21 U.S.C. section 360bbb-3(b)(1), unless the authorization is terminated or revoked.     Resp Syncytial Virus by PCR NEGATIVE NEGATIVE Final    Comment: (NOTE) Fact Sheet for Patients: BloggerCourse.com  Fact Sheet for Healthcare Providers: SeriousBroker.it  This test is not yet approved or cleared by the [place]  FDA and has been authorized for detection and/or diagnosis of SARS-CoV-2 by FDA under an Emergency Use Authorization (EUA). This EUA will remain in effect (meaning this test can be used) for the duration of the COVID-19 declaration under Section 564(b)(1) of the Act, 21 U.S.C. section 360bbb-3(b)(1), unless the authorization is terminated or revoked.  Performed at [facility], [address], [place], [place] [address]   MRSA Next Gen by PCR, Nasal     Status: None   Collection Time: 06/13/23  8:48 AM   Specimen: Nasal Mucosa; Nasal Swab  Result Value Ref Range Status   MRSA by PCR Next Gen NOT DETECTED NOT DETECTED Final    Comment: (NOTE) The GeneXpert MRSA Assay (FDA approved for NASAL specimens only), is one component of a comprehensive MRSA colonization surveillance program. It is not intended to diagnose MRSA infection nor to guide or monitor treatment for MRSA infections. Test performance is not FDA approved in patients less than 60 years old. Performed at [facility], [address], [place], [place] [address]     Labs: CBC: Recent Labs  Lab 06/13/23 0031 06/14/23 0422  WBC 7.3 6.0  NEUTROABS 5.0  --   HGB 11.9* 11.2*  HCT 38.4* 36.6*  MCV 83.7 85.1  PLT 284 219   Basic Metabolic Panel: Recent Labs  Lab 06/13/23 0031 06/14/23 0422  NA 137  138  K 3.3* 4.1  CL 103 104  CO2 24 26  GLUCOSE 151* 99  BUN 9 5*  CREATININE 0.83 0.67  CALCIUM  8.6* 8.5*  MG 1.9  --   PHOS 4.0  --    Liver Function Tests: Recent Labs  Lab 06/13/23 0031  AST 17  ALT 12  ALKPHOS 61  BILITOT 0.5  PROT 7.0  ALBUMIN 2.8*   CBG: Recent Labs  Lab 06/13/23 0926  GLUCAP 128*    Discharge time spent: greater than 30 minutes.  Signed: Demaris Fillers, MD [facility] 06/14/2023

## 2023-06-14 NOTE — Progress Notes (Signed)
 Discharge instructions explained. No needs at this time. IV s removed.

## 2023-06-15 ENCOUNTER — Inpatient Hospital Stay: Attending: Nurse Practitioner | Admitting: Nurse Practitioner

## 2023-06-15 ENCOUNTER — Encounter: Payer: Self-pay | Admitting: *Deleted

## 2023-06-15 ENCOUNTER — Encounter: Payer: Self-pay | Admitting: Nurse Practitioner

## 2023-06-15 ENCOUNTER — Ambulatory Visit (HOSPITAL_BASED_OUTPATIENT_CLINIC_OR_DEPARTMENT_OTHER): Admitting: Pulmonary Disease

## 2023-06-15 VITALS — BP 126/88 | HR 93 | Temp 98.2°F | Resp 18 | Ht 72.0 in | Wt 293.3 lb

## 2023-06-15 DIAGNOSIS — E279 Disorder of adrenal gland, unspecified: Secondary | ICD-10-CM | POA: Diagnosis not present

## 2023-06-15 DIAGNOSIS — C3412 Malignant neoplasm of upper lobe, left bronchus or lung: Secondary | ICD-10-CM | POA: Insufficient documentation

## 2023-06-15 DIAGNOSIS — R490 Dysphonia: Secondary | ICD-10-CM | POA: Diagnosis not present

## 2023-06-15 DIAGNOSIS — C3490 Malignant neoplasm of unspecified part of unspecified bronchus or lung: Secondary | ICD-10-CM

## 2023-06-15 DIAGNOSIS — R042 Hemoptysis: Secondary | ICD-10-CM | POA: Diagnosis not present

## 2023-06-15 LAB — CYTOLOGY - NON PAP

## 2023-06-15 NOTE — Progress Notes (Signed)
 Raymond Hurst returns for his first outpatient follow-up visit.  He was seen by Dr. Scherrie Curt in the hospital on 06/06/2023.  He reports continued hoarseness and shortness of breath.  He has not seen ENT about the vocal cord paralysis yet.  He has a productive cough.  After the bronchoscopy he noted mild hemoptysis.  He denies fever.  Overall good appetite.  Objective:  Vital signs in last 24 hours:  Blood pressure 126/88, pulse 93, temperature 98.2 F (36.8 C), temperature source Temporal, resp. rate 18, height 6' (1.829 m), weight 293 lb 4.8 oz (133 kg), SpO2 100%.    HEENT: Ecchymosis left posterior palate and buccal mucosa. Resp: Distant breath sounds.  No respiratory distress. Cardio: Irregular. GI: No hepatosplenomegaly.. Vascular: Both legs with firm edema. Neuro: Alert and oriented.   Lab Results:  Lab Results  Component Value Date   WBC 6.0 06/14/2023   HGB 11.2 (L) 06/14/2023   HCT 36.6 (L) 06/14/2023   MCV 85.1 06/14/2023   PLT 219 06/14/2023   NEUTROABS 5.0 06/13/2023    Imaging:  No results found.  Medications: I have reviewed the patient's current medications.  Assessment/Plan: Left lung mass CT chest 06/05/2023: Left upper lobe mass with invasion of the mediastinum and effacement of the left upper lobe and lower lobe pulmonary arteries, moderate left pleural effusion, small mediastinal lymph nodes, right adrenal mass favoring myolipoma, probable benign lucent lesion at the right scapula Diagnostic left thoracentesis 06/06/2023: Cytology-no malignant cells identified Bronchoscopy with endobronchial ultrasound and biopsies 06/12/2023-lateral left mainstem bronchial irregularity extending into the left upper lobe airways narrowing the left upper lobe airways, no discrete endobronchial lesion.  Endobronchial ultrasound showed a left hilar mass with significant vascularity;  enlargement at station 4L, 11L, 7.  Station 7, 4L and 11L lymph nodes negative for malignancy; left hilar mass/lymph node metastatic carcinoma; left upper lobe endobronchial brushing malignant cells present; left upper lobe endobronchial biopsy suspicious for malignancy; weak focal p63 positivity in the tumor cells, negative for TTF-1, synaptophysin and CD56, a poorly differentiated squamous cell carcinoma is favored Referred for staging PET scan and brain MRI 06/15/2023   2.  Hoarseness/cough secondary to #1 3.  Atrial fibrillation 4.  Lower extremity edema 5.  Alcohol use    Disposition: Raymond Hurst recently presented with a left lung mass.  He underwent bronchoscopy/EBUS with biopsy of a left hilar mass/lymph node showing metastatic carcinoma, poorly differentiated squamous cell carcinoma favored.  We reviewed the biopsy result with him, his wife and sister at today's visit.  We also reviewed the chest CT report/images from the recent hospitalization with them.    We have requested foundation 1/PDL testing.  We are referring him for a staging PET scan and brain MRI.  He will return for follow-up to review results in the next 7 to 10 days.  Patient seen with Dr. Scherrie Curt.  Diana Forster ANP/GNP-BC   06/15/2023  3:22 PM  This was a shared visit with Diana Forster.  Raymond Hurst was interviewed and examined.  We reviewed results from the bronchoscopy and chest CT images/findings with Raymond Hurst and his family.  He has been diagnosed with non-small cell lung cancer.  He appears to have locally advanced (at least stage III) disease.  The hoarseness is likely related to recurrent laryngeal nerve involvement with tumor. We discussed probable treatment options including chemotherapy/radiation and immunotherapy.  I doubt  he will be a surgical candidate.  He will be referred for a staging PET scan and brain MRI.  We requested molecular testing on the bronchoscopy tissue.  I recommended he follow-up with ENT  to evaluate the hoarseness.  I was present for greater than 50% of today's visit.  I performed medical decision making.  Anise Kerns, MD

## 2023-06-15 NOTE — Progress Notes (Signed)
 PATIENT NAVIGATOR PROGRESS NOTE  Name: Raymond Hurst Date: 06/15/2023 MRN: [id number]  DOB: 1950-10-29   Reason for visit:  F/U hospital appt with Diana Forster NP and Dr Scherrie Curt  Comments:  Met with pt and his wife Raymond Hurst during visit  Referral placed to SW Referral placed to Nutrition Given Journeys notebook with disease specific information Foundation one and PDL-1 testing requested on accession number [id number] Given contact information  PET scan and MRI brain to be scheduled after obtaining PA    Time spent counseling/coordinating care: > 60 minutes

## 2023-06-16 NOTE — Telephone Encounter (Signed)
 Advise

## 2023-06-16 NOTE — Telephone Encounter (Signed)
**Note De-identified  [place] Obfuscation** Please advise 

## 2023-06-20 ENCOUNTER — Other Ambulatory Visit

## 2023-06-20 ENCOUNTER — Encounter: Payer: Self-pay | Admitting: Acute Care

## 2023-06-20 ENCOUNTER — Ambulatory Visit: Admitting: Acute Care

## 2023-06-20 ENCOUNTER — Ambulatory Visit (HOSPITAL_COMMUNITY)
Admission: RE | Admit: 2023-06-20 | Discharge: 2023-06-20 | Disposition: A | Discharge status: Home / Self Care | Source: Ambulatory Visit | Attending: Nurse Practitioner | Admitting: Nurse Practitioner

## 2023-06-20 VITALS — BP 112/73 | HR 78 | Ht 72.0 in | Wt 295.0 lb

## 2023-06-20 DIAGNOSIS — Z8679 Personal history of other diseases of the circulatory system: Secondary | ICD-10-CM

## 2023-06-20 DIAGNOSIS — Z9889 Other specified postprocedural states: Secondary | ICD-10-CM

## 2023-06-20 DIAGNOSIS — R052 Subacute cough: Secondary | ICD-10-CM

## 2023-06-20 DIAGNOSIS — R0609 Other forms of dyspnea: Secondary | ICD-10-CM

## 2023-06-20 DIAGNOSIS — R918 Other nonspecific abnormal finding of lung field: Secondary | ICD-10-CM | POA: Diagnosis not present

## 2023-06-20 DIAGNOSIS — Z87891 Personal history of nicotine dependence: Secondary | ICD-10-CM | POA: Diagnosis not present

## 2023-06-20 DIAGNOSIS — C3490 Malignant neoplasm of unspecified part of unspecified bronchus or lung: Secondary | ICD-10-CM | POA: Insufficient documentation

## 2023-06-20 LAB — GLUCOSE, CAPILLARY: Glucose-Capillary: 98 mg/dL (ref 70–99)

## 2023-06-20 MED ORDER — LEVALBUTEROL TARTRATE 45 MCG/ACT IN AERO: 2.0000 | INHALATION_SPRAY | RESPIRATORY_TRACT | Status: DC | PRN

## 2023-06-20 MED ORDER — FLUDEOXYGLUCOSE F - 18 (FDG) INJECTION
14.7000 | Freq: Once | INTRAVENOUS | Status: AC
  Administered 2023-06-20: 14.68 via INTRAVENOUS

## 2023-06-20 MED ORDER — BREZTRI AEROSPHERE 160-9-4.8 MCG/ACT IN AERO
2.0000 | INHALATION_SPRAY | Freq: Two times a day (BID) | RESPIRATORY_TRACT | Status: DC
Start: 2023-06-20 — End: 2023-09-07

## 2023-06-20 MED ORDER — HYDROCODONE BIT-HOMATROP MBR 5-1.5 MG/5ML PO SOLN: 5.0000 mL | Freq: Four times a day (QID) | ORAL | 0 refills | Status: DC | PRN

## 2023-06-20 NOTE — Patient Instructions (Addendum)
 It is good to see you today I am sorry you had to go to the hospital after the biopsy. I am glad you are feeling better now. Your biopsies were + for a carcinoma, as Dr. Scherrie Curt discussed with you You have follow up with Dr. Scherrie Curt scheduled 06/27/2023. You also have PET scan today, and MRI Brain next week. I have sent in a prescription for Hycodan cough syrup for cough. Use as needed up to every 6 hours. Do not drive when taking this medication. I have sent in a prescription for Xopenex . This is a rescue inhaler for shortness of breath or wheezing. Use as needed up to every 6 hours. We will give you some Breztri samples today. 2 puffs in the morning and 2 puffs in the evening.  Call us  and let us  know if you like this so we can send in a prescription.  Call if you need us  . Please contact office for sooner follow up if symptoms do not improve or worsen or seek emergency care

## 2023-06-20 NOTE — Progress Notes (Signed)
 History of Present Illness Raymond Hurst is a 73 y.o. male former smoker ( quit 30 years ago with a 40 pack year smoking history), seen as an inpatient for a large left lung mass. He is followed by Dr. Baldwin Levee.  Pertinent Medical History Afib on chronic a/c [facility] compliant with cpap Chronic normocytic anemia ETOH Abuse  Synopsis Pt. has experienced a persistent cough since December, which was unusual for him. 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 Despite antibiotic treatment, his condition worsened. Raymond Hurst  presented to ED 06/05/2023 with persistent and lingering cough as well as sob and progressive DOE to the point of some dyspnea when speaking. Pt and family endorse  that he has been in his normal state of health until 4-5 months ago when he began having a cough that has progressively worsening and eventually  resulting in hoarseness. Wife states that this has gotten exponentially worse over the past week prior to admission. She states that he has been increasingly sob when walking to the point of some dyspnea with speech. He denied any increased sputum production nor hemoptysis.  He denied any chest pain, no presyncopal events, no Headache, no pain, muscle aches. States that his chronic le edema is stable and not worsening. He  endorses compliance with his home cpap.  Imaging of the chest was done which revealed a Left hilar mass, mediastinal adenopathy , both concerning for malignancy. The inpatient team were consulted to better evaluate and treat large L lung mass that was obstructing the LUL pulmonary artery as well and LLL artery with pleural effusion. He underwent a thoracentesis, which drained 300 cc's of  serosanguinous appearing fluid  from the left pleural space on  06/06/2022 which was negative for malignant cells. He then underwent a bronchoscopy with biopsies per Dr. Baldwin Levee on 06/12/2023 for tissue sampling and  diagnosis. He is here today to review the results of the tissue sampling.  06/20/2023 Patient presents for follow-up after navigational bronchoscopy with biopsies on 06/12/2023.  Patient states initially he did well after the procedure however developed dizziness and palpitations post bronchoscopy.  The patient presented to the emergency room at [facility] 06/13/2023 and was found to be in atrial fibrillation with RVR.  He had a potassium of 3.3,  he had also taken a dose of Eliquis  which she had been advised not to start until the next morning.  Patient had held his metoprolol  prior to the procedure which may have contributed to his atrial for with rapid ventricular response in the setting of his hypokalemia.  Potassium was repleted,  he was also hypotensive requiring treatment with Levophed  to support his blood pressure.  CT of the chest was negative for PE or dissection.  Pt. was monitored overnight in the emergency room, was weaned off his Levophed  with no additional hypotension, metoprolol  was restarted without issue.  His home Lasix  was initially held however as patient had recovered with adequate blood pressure he was told to restart his Lasix  after discharge from the ER.  I have reviewed the results of the biopsy with the patient and his wife.  I explained that the left hilar mass and lymph node were positive for metastatic carcinoma which favored a squamous cell carcinoma.    Patient is currently being followed by Dr. Scherrie Curt , oncology who saw and examined the patient on 5/15/ 25 and was aware of the biopsy results.  His plan  per his note was for foundation 1/PDL testing, as well as to staging PET scan and an MRI of the brain to complete staging.  He has follow-up scheduled with Dr. Scherrie Curt once the scans have been completed to evaluate for any metastatic disease.  He also has referred the patient to ENT for further evaluation of his hoarseness.  He has an appointment scheduled Jun 22, 2023.  Patient continues to have a significant cough and shortness of breath.  Patient's wife is requesting an albuterol  inhaler to see if that can help.  I explained with his atrial fibrillation albuterol  is probably not the best medication for rescue.  I have ordered a Xopenex  inhaler however I have warned him that this may be very expensive as it was not listed as a preferred medication on their insurance plan. I have also given the patient samples of Breztri to see if this could possibly help with his dyspnea.  Patient was instructed on use.  He understands to take 2 puffs  in the morning  and 2 puffs in the afternoon and rinse his mouth after each use.  I have asked both the patient and his wife to let us  know if he felt that this was helpful so that we can send in a prescription.  I explained to him if it is cost prohibitive to let us  know so that we can help him fill out financial assistance paperwork through the manufacturer. I have also prescribed Hycodan cough syrup to see if we can help with the patient's cough and allow him better nighttime sleep.  I have warned him not to drive or operate heavy machinery while using that medication as it will make him sleepy.  Both he and his wife verbalized understanding of the above.  Patient's wife is requesting that tissue be sent to [facility] for a second opinion of the pathology report.  I explained to her that we are currently sending tissue for foundation 1, and PDL testing.  I explained that I did not want anything to slow down that process that could potentially delay his care.  We discussed that we could send the tissue out for second opinion once PD-L1 and foundation 1 testing were complete.  I have messaged Dr. Scherrie Curt to make him aware of this request and he is in agreement with me that this can be done at a later date once we have molecular and genetic testing completed as the results may help determine treatment straegy.  Both patient and his wife are  in agreement with this plan  Test Results: Cytology 06/12/2023 A. LYMPH NODE, LEFT HILAR MASS, FINE NEEDLE ASPIRATION:  - Metastatic carcinoma.  See comment. favor a poorly differentiated squamous cell  carcinoma   E. LUNG, LUL ENDOBRONCHIAL, BRUSHING:  - Malignant cells present   F. LUNG, LUL ENDOBRONCHIAL, BIOPSY:  - Suspicious for malignancy   B. LYMPH NODE, STATION 7, FINE NEEDLE ASPIRATION:  - Benign lymphocytes   C. LYMPH NODE, STATION 4L, FINE NEEDLE ASPIRATION:  - Benign bronchial cells and scattered lymphocytes   D. LYMPH NODE, STATION 11L, FINE NEEDLE ASPIRATION:  - Benign lymphocytes and few benign bronchial cells   CT Chest 06/05/2023  10.9  x  5.6 x 7.2 cm left upper lobe mass which invades the mediastinum in the vicinity of the hilum and AP window. This causes effacement or occlusion of portions of the left upper lobe pulmonary artery and narrowing of the left lower lobe pulmonary artery. Pulmonary artery  invasion by tumor is not completely excluded. The mass is compatible with bronchogenic carcinoma. Pulmonology and oncology consultations may be indicated for tissue sampling further workup. 2. Moderate left pleural effusion, malignant effusion is a distinct possibility. 3. No obvious filling defect in the visible pulmonary arterial tree to indicate typical pulmonary embolus, with the understanding that part of the left upper lobe pulmonary arterial tree is occluded by the mass, and portions of the lower lobe pulmonary arterial tree bilaterally are obscured by motion artifact. 4. Patchy ground-glass opacities in the remainder of the left upper lobe and left lower lobe with passive atelectasis of much of the left lower lobe. Hazy ground-glass density in the right lung is present as well, raising the possibility of pulmonary edema. 5. Moderate cardiomegaly. 6. Coronary, aortic arch, and branch vessel atherosclerotic vascular disease. Aortic Atherosclerosis  (ICD10-I70.0). 7. 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 8. Lucent lesion along the right scapular neck is probably benign.     Latest Ref Rng & Units 06/14/2023    4:22 AM 06/13/2023   12:31 AM 06/07/2023    6:37 AM  CBC  WBC 4.0 - 10.5 K/uL 6.0  7.3  7.0   Hemoglobin 13.0 - 17.0 g/dL 81.1  91.4  78.2   Hematocrit 39.0 - 52.0 % 36.6  38.4  38.8   Platelets 150 - 400 K/uL 219  284  249        Latest Ref Rng & Units 06/14/2023    4:22 AM 06/13/2023   12:31 AM 06/07/2023    6:37 AM  BMP  Glucose 70 - 99 mg/dL 99  956  213   BUN 8 - 23 mg/dL 5  9  9    Creatinine 0.61 - 1.24 mg/dL 0.86  5.78  4.69   Sodium 135 - 145 mmol/L 138  137  136   Potassium 3.5 - 5.1 mmol/L 4.1  3.3  3.8   Chloride 98 - 111 mmol/L 104  103  102   CO2 22 - 32 mmol/L 26  24  25    Calcium  8.9 - 10.3 mg/dL 8.5  8.6  8.7     BNP    Component Value Date/Time   BNP 130.0 (H) 06/13/2023 0031    ProBNP No results found for: "PROBNP"  PFT    Component Value Date/Time   FEV1PRE 3.03 04/17/2020 1156   FEV1POST 3.19 04/17/2020 1156   FVCPRE 3.97 04/17/2020 1156   FVCPOST 3.91 04/17/2020 1156   TLC 6.21 04/17/2020 1156   DLCOUNC 25.32 04/17/2020 1156   PREFEV1FVCRT 76 04/17/2020 1156   PSTFEV1FVCRT 82 04/17/2020 1156    CT Chest W Contrast Result Date: 06/13/2023 CLINICAL DATA:  Dizziness and tachycardia EXAM: CT CHEST WITH CONTRAST TECHNIQUE: Multidetector CT imaging of the chest was performed during intravenous contrast administration. RADIATION DOSE REDUCTION: This exam was performed according to the departmental dose-optimization program which includes automated exposure control, adjustment of the mA and/or kV according to patient size and/or use of iterative reconstruction technique. CONTRAST:  75mL OMNIPAQUE  IOHEXOL  300 MG/ML  SOLN COMPARISON:  Plain film from earlier in the same day, CT from 06/05/2023 FINDINGS: Cardiovascular: Atherosclerotic calcifications of the  thoracic aorta are noted. No aneurysmal dilatation or dissection is seen. The pulmonary artery as visualized is within normal limits although not timed for embolus evaluation. Attenuation of left upper lobe pulmonary arterial branches is seen secondary to the known central left hilar mass. Coronary calcifications are noted.  No cardiac enlargement is seen. Mediastinum/Nodes: The esophagus is within normal limits. Thoracic inlet is unremarkable. Scattered small mediastinal nodes are seen. Lungs/Pleura: Large left hilar mass lesion is noted which measures at least 10 cm in greatest dimension similar to that seen on the prior exam. It extends to the pleural margin. Attenuation of the left upper lobe bronchial tree as well as the left upper lobe pulmonary arteries is noted secondary to the mass. Associated pleural effusion is seen. The right lung remains clear. Upper Abdomen: Visualized upper abdomen shows a fatty lesion arising from the right adrenal gland measuring 7.1 cm. This is most consistent with a myelolipoma. Musculoskeletal: No acute rib abnormality is noted. Degenerative changes of the thoracic spine are seen. L1 compression fracture is noted. IMPRESSION: Stable appearing left hilar mass radiating to the pleural margin. The overall appearance is similar to that seen on the prior exam. Associated pleural effusion is noted. No definitive pulmonary emboli or aortic dissection is noted. Electronically Signed   By: Violeta Grey M.D.   On: 06/13/2023 03:31   DG Chest Port 1 View Result Date: 06/13/2023 CLINICAL DATA:  Dyspnea. EXAM: PORTABLE CHEST 1 VIEW COMPARISON:  Jun 06, 2023 FINDINGS: The cardiac silhouette is mildly enlarged and unchanged in size. Mild to moderate severity atelectasis and/or infiltrate is seen within the left lung base. An ill-defined hazy opacity is seen overlying the left upper lobe. There is a small left pleural effusion. No pneumothorax is identified. Multilevel degenerative changes seen  throughout the thoracic spine. IMPRESSION: 1. Mild to moderate severity left basilar atelectasis and/or infiltrate. 2. Small left pleural effusion. 3. Ill-defined hazy opacity overlying the left upper lobe which corresponds to the left upper lobe lung mass seen on prior chest CT (Jun 05, 2023). Electronically Signed   By: Virgle Grime M.D.   On: 06/13/2023 01:22   DG Chest 2 View Result Date: 06/06/2023 CLINICAL DATA:  Status post thoracentesis EXAM: CHEST - 2 VIEW COMPARISON:  Jun 01, 2023 FINDINGS: Post thoracentesis without evidence of pneumothorax No change in the left upper lobe mass lesion compared with prior chest x-ray and prior CT Jun 05, 2023. Right lung clear Heart normal size IMPRESSION: Post thoracentesis without evidence of pneumothorax Electronically Signed   By: Fredrich Jefferson M.D.   On: 06/06/2023 15:59   CT Angio Chest PE W/Cm &/Or Wo Cm Result Date: 06/05/2023 CLINICAL DATA:  Shortness of breath.  Left upper lobe mass. * Tracking Code: BO * EXAM: CT ANGIOGRAPHY CHEST WITH CONTRAST TECHNIQUE: Multidetector CT imaging of the chest was performed using the standard protocol during bolus administration of intravenous contrast. Multiplanar CT image reconstructions and MIPs were obtained to evaluate the vascular anatomy. RADIATION DOSE REDUCTION: This exam was performed according to the departmental dose-optimization program which includes automated exposure control, adjustment of the mA and/or kV according to patient size and/or use of iterative reconstruction technique. CONTRAST:  75mL OMNIPAQUE  IOHEXOL  350 MG/ML SOLN COMPARISON:  Chest radiograph 06/01/2023 FINDINGS: Despite efforts by the technologist and patient, motion artifact is present on today's exam and could not be eliminated. This reduces exam sensitivity and specificity. Cardiovascular: Left upper lobe branches of the left pulmonary artery are effaced or occluded due to a 10.9 by 5.6 by 7.2 cm left upper lobe mass which invades the  aorta pulmonary window and mediastinum. This also causes narrowing of the left lower lobe pulmonary artery and pulmonary artery invasion by tumor is not completely excluded. Mass abuts the left lateral pleural  margin and obviously extends across the mediastinal pleural margin. There is a moderate left pleural effusion malignant effusion is a distinct possibility. Separate from this mass effect causing narrowing and occlusion of portions of the left upper lobe pulmonary artery, I do not see a separate pulmonary embolus. Lower lobe pulmonary arteries are somewhat obscured by motion artifact. Coronary, aortic arch, and branch vessel atherosclerotic vascular disease. Moderate cardiomegaly. Mediastinum/Nodes: 1.1 cm right paratracheal lymph node on image 41 series 5. Additional clustered right lower paratracheal lymph nodes are present. The dominant left upper lobe mass invades the mediastinum in the vicinity of the hilum and AP window. Along the posterior mediastinum, there are hypodense nodules in the paraspinal location especially along the right, query extramedullary hematopoiesis. Lungs/Pleura: Occlusion of the left superior lobar bronchus by the large left upper lobe mass. The lingular bronchus is narrowed but not occluded. Patchy ground-glass opacities in the remainder of the left upper lobe and left lower lobe with passive atelectasis of much of the left lower lobe. Hazy ground-glass density in the right lung is present as well, raising the possibility of pulmonary edema. Upper Abdomen: 7.1 by 4.7 cm right adrenal mass with some fatty elements favoring myelolipoma. Musculoskeletal: Thoracic spondylosis. Lucent lesion along the right scapular neck is probably benign. Review of the MIP images confirms the above findings. IMPRESSION: 1. 10.9 by 5.6 by 7.2 cm left upper lobe mass which invades the mediastinum in the vicinity of the hilum and AP window. This causes effacement or occlusion of portions of the left upper  lobe pulmonary artery and narrowing of the left lower lobe pulmonary artery. Pulmonary artery invasion by tumor is not completely excluded. The mass is compatible with bronchogenic carcinoma. Pulmonology and oncology consultations may be indicated for tissue sampling further workup. 2. Moderate left pleural effusion, malignant effusion is a distinct possibility. 3. No obvious filling defect in the visible pulmonary arterial tree to indicate typical pulmonary embolus, with the understanding that part of the left upper lobe pulmonary arterial tree is occluded by the mass, and portions of the lower lobe pulmonary arterial tree bilaterally are obscured by motion artifact. 4. Patchy ground-glass opacities in the remainder of the left upper lobe and left lower lobe with passive atelectasis of much of the left lower lobe. Hazy ground-glass density in the right lung is present as well, raising the possibility of pulmonary edema. 5. Moderate cardiomegaly. 6. Coronary, aortic arch, and branch vessel atherosclerotic vascular disease. Aortic Atherosclerosis (ICD10-I70.0). 7. 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 8. Lucent lesion along the right scapular neck is probably benign. Electronically Signed   By: Freida Jes M.D.   On: 06/05/2023 17:44   DG Chest 2 View Result Date: 06/01/2023 CLINICAL DATA:  Cough for several weeks EXAM: CHEST - 2 VIEW COMPARISON:  04/21/2020 FINDINGS: Frontal and lateral views of the chest demonstrate an enlarged cardiac silhouette. There is a large masslike area of consolidation within the left upper lobe along the major fissure, measuring up to 9.1 cm in size. This could reflect pneumonia in the appropriate clinical setting, and close follow-up after medical management is recommended to document resolution and exclude underlying mass. Increased density at the left hilum may reflect adenopathy. Small left pleural effusion. Right chest is clear. No  acute bony abnormalities. IMPRESSION: 1. Large area of masslike consolidation within the left upper lobe, with associated left hilar prominence suggesting underlying adenopathy. While pneumonia could give this appearance, the radiographic pattern  is somewhat concerning for underlying lung mass. If the patient has current signs or symptoms of infection, close imaging follow-up after appropriate medical management is recommended to document resolution. If there are no current signs of infection, CT chest with IV contrast is recommended to assess for underlying neoplasm. 2. Small left pleural effusion. These results will be called to the ordering clinician or representative by the Radiologist Assistant, and communication documented in the PACS or [employer]. Electronically Signed   By: Bobbye Burrow M.D.   On: 06/01/2023 15:17     Past medical hx Past Medical History:  Diagnosis Date   Adrenal tumor    right adrenal gland tumor favored to be a myelolipoma   Atrial fibrillation (HCC)    Cellulitis of right lower leg    Dysrhythmia    A-fib   History of TIA (transient ischemic attack)    May 2020   Lymphedema    OSA on CPAP    severe obstructive sleep apnea with an AHI of 54/h with no significant central events.  He had nocturnal hypoxemia with O2 sat nadir of 70%. Now on CPAP   Pulmonary mass    Stroke [facility])      Social History   Tobacco Use   Smoking status: Former    Current packs/day: 0.00    Average packs/day: 2.0 packs/day for 20.0 years (40.0 ttl pk-yrs)    Types: Cigarettes    Start date: 01/31/1970    Quit date: 01/31/1990    Years since quitting: 33.4   Smokeless tobacco: Never  Vaping Use   Vaping status: Never Used  Substance Use Topics   Alcohol use: Yes    Comment: 4-5 beer daily   Drug use: No    RaymondEarhart reports that he quit smoking about 33 years ago. His smoking use included cigarettes. He started smoking about 53 years ago. He has a 40 pack-year smoking  history. He has never used smokeless tobacco. He reports current alcohol use. He reports that he does not use drugs.  Tobacco Cessation: Counseling given: Not Answered Former smoker with a 40 pack year smoking history  Past surgical hx, Family hx, Social hx all reviewed.  Current Outpatient Medications on File Prior to Visit  Medication Sig   acetaminophen  (TYLENOL ) 500 MG tablet Take 500 mg by mouth every 6 (six) hours as needed for mild pain.   allopurinol  (ZYLOPRIM ) 100 MG tablet Take 100 mg by mouth daily.   apixaban  (ELIQUIS ) 5 MG TABS tablet Take 1 tablet (5 mg total) by mouth 2 (two) times daily. Okay to restart this medication on 06/13/2023   furosemide  (LASIX ) 40 MG tablet Take 1 tablet (40 mg total) by mouth daily.   metoprolol  tartrate (LOPRESSOR ) 25 MG tablet Take 1 tablet (25 mg total) by mouth 2 (two) times daily.   No current facility-administered medications on file prior to visit.     No Known Allergies  Review Of Systems:  Constitutional:   No  weight loss, night sweats,  Fevers, chills, fatigue, or  lassitude.  HEENT:   No headaches,  Difficulty swallowing,  Tooth/dental problems, or  Sore throat,                No sneezing, itching, ear ache, nasal congestion, post nasal drip, + hoarseness  CV:  No chest pain,  Orthopnea, PND, swelling in lower extremities, anasarca, dizziness, palpitations, syncope.   GI  No heartburn, indigestion, abdominal pain, nausea, vomiting, diarrhea, change in bowel habits,  loss of appetite, bloody stools.   Resp: + shortness of breath with exertion less at rest.  No excess mucus, + productive cough,  + non-productive cough,  No coughing up of blood.  No change in color of mucus.  No wheezing.  No chest wall deformity  Skin: no rash or lesions.  GU: no dysuria, change in color of urine, no urgency or frequency.  No flank pain, no hematuria   MS:  No joint pain or swelling.  No decreased range of motion.  No back pain.  Psych:  No  change in mood or affect. No depression or anxiety.  No memory loss.   Vital Signs BP 112/73   Pulse 78   Ht 6' (1.829 m)   Wt 295 lb (133.8 kg)   SpO2 98%   BMI 40.01 kg/m    Physical Exam:  General- No distress,  A&Ox3, pleasant ENT: No sinus tenderness, TM clear, pale nasal mucosa, no oral exudate,no post nasal drip, + LAN, + Hoarseness Cardiac: S1, S2, regular rate and rhythm, no murmur Chest: No wheeze/ rales/ dullness; no accessory muscle use, no nasal flaring, no sternal retractions, rhomchi which clears with cough Abd.: Soft Non-tender, ND, BS +, Body mass index is 40.01 kg/m.  Ext: No clubbing cyanosis, edema, no obvious deformities Neuro:  normal strength, moving all extremities x 4, alert and oriented x 3, appropriate Skin: No rashes, warm and dry, no obvious skin lesions Psych: normal mood and behavior   Assessment/Plan New diagnosis metastatic cancer Former smoker Post bronchoscopy with biopsies History of atrial fib Dyspnea Cough Plan I am sorry you had to go to the hospital after the biopsy. I am glad you are feeling better now. Your biopsies were + for a carcinoma, as Dr. Scherrie Curt discussed with you You have follow up with Dr. Scherrie Curt scheduled 06/27/2023. You also have PET scan today, and MRI Brain next week to complete staging.. I have sent in a prescription for Hycodan cough syrup for cough. Use as needed up to every 6 hours. Do not drive when taking this medication. I have sent in a prescription for Xopenex . This is a rescue inhaler for shortness of breath or wheezing. Use as needed up to every 6 hours. We will give you some Breztri samples today. 2 puffs in the morning and 2 puffs in the evening.  Call us  and let us  know if you like this so we can send in a prescription.  Call if you need us  . Tissue samples can be sent to [facility] for a second opinion once genetic and molecular testing have been completed. Please contact office for sooner follow up  if symptoms do not improve or worsen or seek emergency care     I spent 60 minutes dedicated to the care of this patient on the date of this encounter to include pre-visit review of records, face-to-face time with the patient discussing conditions above, post visit ordering of testing, clinical documentation with the electronic health record, making appropriate referrals as documented, and communicating necessary information to the patient's healthcare team.    Raejean Bullock, NP 06/20/2023  8:22 PM

## 2023-06-21 ENCOUNTER — Telehealth (INDEPENDENT_AMBULATORY_CARE_PROVIDER_SITE_OTHER): Payer: Self-pay | Admitting: Otolaryngology

## 2023-06-21 NOTE — Telephone Encounter (Signed)
 Spoke with wife, informed of new location and helped with directions.

## 2023-06-22 ENCOUNTER — Encounter (INDEPENDENT_AMBULATORY_CARE_PROVIDER_SITE_OTHER): Payer: Self-pay | Admitting: Otolaryngology

## 2023-06-22 ENCOUNTER — Ambulatory Visit (INDEPENDENT_AMBULATORY_CARE_PROVIDER_SITE_OTHER): Admitting: Otolaryngology

## 2023-06-22 ENCOUNTER — Other Ambulatory Visit: Payer: Self-pay | Admitting: *Deleted

## 2023-06-22 VITALS — BP 123/70 | HR 72

## 2023-06-22 DIAGNOSIS — C3490 Malignant neoplasm of unspecified part of unspecified bronchus or lung: Secondary | ICD-10-CM

## 2023-06-22 DIAGNOSIS — Z87891 Personal history of nicotine dependence: Secondary | ICD-10-CM | POA: Diagnosis not present

## 2023-06-22 DIAGNOSIS — J383 Other diseases of vocal cords: Secondary | ICD-10-CM

## 2023-06-22 DIAGNOSIS — J3800 Paralysis of vocal cords and larynx, unspecified: Secondary | ICD-10-CM | POA: Diagnosis not present

## 2023-06-22 DIAGNOSIS — R490 Dysphonia: Secondary | ICD-10-CM | POA: Diagnosis not present

## 2023-06-22 NOTE — Patient Instructions (Signed)

## 2023-06-22 NOTE — Progress Notes (Signed)
 ENT CONSULT:  Reason for Consult: dysphonia, hx of left lung cancer and vocal fold paralysis    HPI: Discussed the use of AI scribe software for clinical note transcription with the patient, who gave verbal consent to proceed.  History of Present Illness Raymond Hurst is a 73 year old male with vocal cord paralysis who presents for evaluation and management of his condition. He was referred by Dr. Sidney Drain, [facility] Craven, for evaluation of left vocal cord paralysis and dysphonia  He has been experiencing dysphonia, diagnosed recently following a hospital admission. Symptoms began approximately three weeks ago with an 'off' voice, initially thought to be laryngitis. Despite antibiotic treatment, his condition worsened, leading to breathlessness when speaking.  He has a history of a lung mass, diagnosed as squamous cell carcinoma, confirmed by bronchoscopy and biopsy. He is awaiting molecular testing results. The cancer has metastasized to the lymph nodes in the chest.  He has a history of smoking, having quit 30 years ago. He also has a history of arrhythmia and is on blood thinners. He possibly had a TIA in the past. No significant issues with swallowing, though occasionally experiences difficulty when drinking water too quickly. A swallow study conducted a couple of weeks ago showed no aspiration (FEES in the hospital).  He uses an inhaler and hydrocodone cough syrup for symptom management, particularly at night or when not driving. He also drinks hot teas and honey to alleviate symptoms.  Records Reviewed:  Discharge Summary 06/13/23 - 06/14/23 73 year old male with past medical history of alcohol abuse, lymphedema, permanent atrial fibrillation, A-fib on Eliquis , gout, GI bleed, CVA. Presented with dizziness and palpitation post bronchoscopy Patient apparently had a bronchoscopy for evaluation of lung mass around 9 AM on yesterday 06/12/2023 Later in the evening he progressed to have dizziness, and  palpitation.. He apparently was prescribed metoprolol , Lasix  and Eliquis  which she was told to hold for the procedure.  He invertedly took 1 dose of Eliquis  p.m. glucose 151, BNP 130 Hemoglobin 11.9, WBC 7.3, platelets 284.  Sodium 137, potassium 3.3, bicarbonate 24, serum creatinine 0.83.  EKG showed atrial fibrillation with RVR and nonspecific ST changes.  Troponin 6>>7 Lactic acid 1.2.  INR 1.3.  CT of the chest showed stable left hilar mass radiating to the pleural margin.  There is no PE or dissection.  Lung Mass - S/p Bronchoscopy with Biopsy with Pulmonology yesterday - Follow up outpatient with Pulmonology   #Tobacco Use Disorder - Cessation discussed   # Alcohol abuse -no signs of withdraw   #Chronic Lymphedema - TED hose - Hold home Lasix  in setting of Hypotension - restart lasix  after d/c   #morbid Obesity Body mass index is 40.33 kg/m.   Past Medical History:  Diagnosis Date   Adrenal tumor    right adrenal gland tumor favored to be a myelolipoma   Atrial fibrillation (HCC)    Cellulitis of right lower leg    Dysrhythmia    A-fib   History of TIA (transient ischemic attack)    May 2020   Lymphedema    OSA on CPAP    severe obstructive sleep apnea with an AHI of 54/h with no significant central events.  He had nocturnal hypoxemia with O2 sat nadir of 70%. Now on CPAP   Pulmonary mass    Stroke [facility])     Past Surgical History:  Procedure Laterality Date   BRONCHIAL BIOPSY  06/12/2023   Procedure: BRONCHOSCOPY, WITH BIOPSY;  Surgeon: Racheal Buddle  S, MD;  Location: [facility] ENDOSCOPY;  Service: Cardiopulmonary;;   BRONCHIAL BRUSHINGS  06/12/2023   Procedure: BRONCHOSCOPY, WITH BRUSH BIOPSY;  Surgeon: Denson Flake, MD;  Location: [facility] ENDOSCOPY;  Service: Cardiopulmonary;;   BRONCHIAL NEEDLE ASPIRATION BIOPSY  06/12/2023   Procedure: BRONCHOSCOPY, WITH NEEDLE ASPIRATION BIOPSY;  Surgeon: Denson Flake, MD;  Location: [facility] ENDOSCOPY;  Service: Cardiopulmonary;;    HEMOSTASIS CLIP PLACEMENT  06/12/2023   Procedure: CONTROL OF HEMORRHAGE bronch cold saline and epi;  Surgeon: Denson Flake, MD;  Location: [facility] ENDOSCOPY;  Service: Cardiopulmonary;;   INNER EAR SURGERY     KNEE SURGERY Left    THORACENTESIS Left 06/06/2023   Procedure: THORACENTESIS;  Surgeon: Arlina Lair, MD;  Location: [facility] ENDOSCOPY;  Service: Pulmonary;  Laterality: Left;   TONSILLECTOMY     VIDEO BRONCHOSCOPY WITH ENDOBRONCHIAL ULTRASOUND Left 06/12/2023   Procedure: BRONCHOSCOPY, WITH EBUS;  Surgeon: Denson Flake, MD;  Location: [facility] ENDOSCOPY;  Service: Cardiopulmonary;  Laterality: Left;    Family History  Problem Relation Age of Onset   Heart failure Mother    COPD Mother    Atrial fibrillation Mother    Idiopathic pulmonary fibrosis Father     Social History:  reports that he quit smoking about 33 years ago. His smoking use included cigarettes. He started smoking about 53 years ago. He has a 40 pack-year smoking history. He has never used smokeless tobacco. He reports current alcohol use. He reports that he does not use drugs.  Allergies: No Known Allergies  Medications: I have reviewed the patient's current medications.  The PMH, PSH, Medications, Allergies, and SH were reviewed and updated.  ROS: Constitutional: Negative for fever, weight loss and weight gain. Cardiovascular: Negative for chest pain and dyspnea on exertion. Respiratory: Is not experiencing shortness of breath at rest. Gastrointestinal: Negative for nausea and vomiting. Neurological: Negative for headaches. Psychiatric: The patient is not nervous/anxious  Blood pressure 123/70, pulse 72, SpO2 94%. There is no height or weight on file to calculate BMI.  PHYSICAL EXAM:  Exam: General: Well-developed, well-nourished Communication and Voice: breathy and raspy Respiratory Respiratory effort: Equal inspiration and expiration without stridor Cardiovascular Peripheral Vascular: Warm extremities with  equal color/perfusion Eyes: No nystagmus with equal extraocular motion bilaterally Neuro/Psych/Balance: Patient oriented to person, place, and time; Appropriate mood and affect; Gait is intact with no imbalance; Cranial nerves I-XII are intact Head and Face Inspection: Normocephalic and atraumatic without mass or lesion Palpation: Facial skeleton intact without bony stepoffs Salivary Glands: No mass or tenderness Facial Strength: Facial motility symmetric and full bilaterally ENT Pinna: External ear intact and fully developed External canal: Canal is patent with intact skin Tympanic Membrane: Clear and mobile External Nose: No scar or anatomic deformity Internal Nose: Septum is S-shaped. No polyp, or purulence. Mucosal edema and erythema present.  Bilateral inferior turbinate hypertrophy.  Lips, Teeth, and gums: Mucosa and teeth intact and viable TMJ: No pain to palpation with full mobility Oral cavity/oropharynx: No erythema or exudate, no lesions present Nasopharynx: No mass or lesion with intact mucosa Hypopharynx: Intact mucosa without pooling of secretions Larynx Glottic: L VF paralysis on the left, A-E fold with overhang making it difficult to see glottic aperture, VF without lesion or mass, bilateral VF atrophy present  Supraglottic: Normal appearing epiglottis and AE folds Interarytenoid Space: Moderate pachydermia&edema Subglottic Space: Patent without lesion or edema Neck Neck and Trachea: Midline trachea without mass or lesion Thyroid : No mass or nodularity Lymphatics: No lymphadenopathy  Procedure: Preoperative  diagnosis: dysphonia VF paralysis on the left  Postoperative diagnosis:   Same  Procedure: Flexible fiberoptic laryngoscopy  Surgeon: Artice Last, MD  Anesthesia: Topical lidocaine  and Afrin Complications: None Condition is stable throughout exam  Indications and consent:  The patient presents to the clinic with above symptoms. Indirect laryngoscopy  view was incomplete. Thus it was recommended that they undergo a flexible fiberoptic laryngoscopy. All of the risks, benefits, and potential complications were reviewed with the patient preoperatively and verbal informed consent was obtained.  Procedure: The patient was seated upright in the clinic. Topical lidocaine  and Afrin were applied to the nasal cavity. After adequate anesthesia had occurred, I then proceeded to pass the flexible telescope into the nasal cavity. The nasal cavity was patent without rhinorrhea or polyp. The nasopharynx was also patent without mass or lesion. The base of tongue was visualized and was normal. There were no signs of pooling of secretions in the piriform sinuses. The true vocal folds were mobile bilaterally. There were no signs of glottic or supraglottic mucosal lesion or mass. There was moderate interarytenoid pachydermia and post cricoid edema. The telescope was then slowly withdrawn and the patient tolerated the procedure throughout.    Studies Reviewed: CT chest 06/13/23 IMPRESSION: Stable appearing left hilar mass radiating to the pleural margin. The overall appearance is similar to that seen on the prior exam. Associated pleural effusion is noted.  PET CT 06/2023 IMPRESSION: 1. Large (11.1 cm in long axis) left hilar mass primarily in the left upper lobe extends from the AP window all the way to the left upper lobe pleural margin and abuts the major fissure with associated hypermetabolic activity, compatible with malignancy. 2. Thickening of the genu of the left adrenal gland demonstrates hypermetabolic activity with maximum SUV 6.1 suspicious for early metastatic lesion of the left adrenal gland. The contralateral (right) adrenal gland demonstrates a fatty lesion favoring benign right myelolipoma. 3. Mildly loculated left pleural fluid effusion indeterminate for malignancy. 4. Progressive consolidation or atelectasis in the left upper lobe. Increased  ground-glass opacity and volume loss in the left lower lobe. 5. Small left hilar lymph nodes are not pathologically enlarged but have maximum SUV 2.4, below blood pool levels. A subcarinal node adjacent to the esophagus is not pathologically enlarged but has a maximum SUV of 4.2, mildly above blood pool, this could be reactive or neoplastic. 6. Mildly enlarged right inguinal lymph nodes are enlarged but not hypermetabolic. 7. Symmetric soft palate activity with maximum SUV 7.2 on the right and 8.1 on the left, without specific associated CT abnormality. This is most likely physiologic. Similarly there is a small amount of activity along the muscular tissues of the right anterior tongue with maximum SUV 5.6, and no CT correlate, most likely physiologic. 8. Chronic L1 compression fracture. 9.  Aortic Atherosclerosis (ICD10-I70.0).   FEES with speech 06/07/2023 Pt presents with grossly normal swallow function.  There was no penetration or aspiration of any consistencies trialed, and there was minimal pharyngeal residue.  OF NOTE: Pt's left vocal fold is entirely immobile.  This may be due to RLN palsy from compression from mass.  There appears to be some extra flaccid tissue at apex of L arytenoid which flops into the laryngeal vestibule at times partially obscuring view of vocal cords.  Pt would benefit from ENT involvement in care. 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  Discussed with pt  slow rate, small sips.  He reports some coughing with drinking, but none observed today.  Encourage use of straw as this was the condition for his FEES and no penetration was observed during this study.  Pt would benefit from ENT/SLP OP follow up.         Assessment/Plan: Encounter Diagnoses  Name Primary?   Dysphonia Yes   Hoarseness    Vocal cord paralysis     Assessment and Plan Assessment & Plan Left  vocal cord paralysis and breathy voice dysphonia  Left vocal cord paralysis due to lung mass SCCa compressing the recurrent laryngeal nerve. Spontaneous resolution unlikely. Vocal cord medialization with injection augmentation proposed to improve phonation and decrease risk of aspiration/improve cough strength. Procedure may require more gel due to paralyzed cord position and vocal cord atrophy. Gel effect lasts 6-12 months, repeat procedures possible. Risks include bruising and potential need for general anesthesia if not able to do under local in the office (he had A-E overhang partially obstructing the view of the glottis. He would like to proceed - Schedule for in office Restylane injection augmentation  - Provided procedure instructions, including transportation and post-procedure care. - Continue anticoagulation, monitor for bruising post-procedure  - Discuss potential need for repeat gel injection if symptoms persist and if only partially medialized after 1st one with residual glottic insufficiency.  Squamous cell carcinoma of lung Squamous cell carcinoma in upper lobe with lymph node involvement, have not initiated treatment. Awaiting molecular testing to finalize treatment plan, likely radiation and chemotherapy followed by immunotherapy. Surgical resection not planned due to tumor location and critical structure involvement.  Atrial fibrillation Atrial fibrillation managed with anticoagulation to prevent thromboembolic events. - on Eliquis     Thank you for allowing me to participate in the care of this patient. Please do not hesitate to contact me with any questions or concerns.   Artice Last, MD Otolaryngology [facility] Phone: [phone] Fax: [phone]    06/22/2023, 4:32 PM

## 2023-06-22 NOTE — Progress Notes (Signed)
 The proposed treatment discussed in conference is for discussion purpose only and is not a binding recommendation.  The patients have not been physically examined, or presented with their treatment options.  Therefore, final treatment plans cannot be decided.

## 2023-06-23 ENCOUNTER — Encounter (HOSPITAL_COMMUNITY)

## 2023-06-23 ENCOUNTER — Ambulatory Visit: Admitting: Oncology

## 2023-06-23 ENCOUNTER — Ambulatory Visit: Admitting: Acute Care

## 2023-06-25 ENCOUNTER — Telehealth: Payer: Self-pay | Admitting: Pulmonary Disease

## 2023-06-25 NOTE — Telephone Encounter (Signed)
 Wife calling to report blood tinged sputum Asked him to stop eliquis  & observe, continue hycodan cough syrup She is hesitant to stop due to A fib recurrence, but explained that risk may be high at this point

## 2023-06-27 ENCOUNTER — Encounter: Payer: Self-pay | Admitting: Nurse Practitioner

## 2023-06-27 ENCOUNTER — Inpatient Hospital Stay (HOSPITAL_BASED_OUTPATIENT_CLINIC_OR_DEPARTMENT_OTHER): Admitting: Nurse Practitioner

## 2023-06-27 ENCOUNTER — Other Ambulatory Visit: Payer: Self-pay | Admitting: *Deleted

## 2023-06-27 VITALS — BP 121/73 | HR 90 | Temp 97.9°F | Resp 18 | Ht 72.0 in | Wt 298.2 lb

## 2023-06-27 DIAGNOSIS — C3490 Malignant neoplasm of unspecified part of unspecified bronchus or lung: Secondary | ICD-10-CM

## 2023-06-27 DIAGNOSIS — C3412 Malignant neoplasm of upper lobe, left bronchus or lung: Secondary | ICD-10-CM

## 2023-06-27 DIAGNOSIS — R0609 Other forms of dyspnea: Secondary | ICD-10-CM | POA: Diagnosis not present

## 2023-06-27 DIAGNOSIS — C3410 Malignant neoplasm of upper lobe, unspecified bronchus or lung: Secondary | ICD-10-CM | POA: Insufficient documentation

## 2023-06-27 DIAGNOSIS — R042 Hemoptysis: Secondary | ICD-10-CM | POA: Diagnosis not present

## 2023-06-27 DIAGNOSIS — R490 Dysphonia: Secondary | ICD-10-CM | POA: Diagnosis not present

## 2023-06-27 DIAGNOSIS — E279 Disorder of adrenal gland, unspecified: Secondary | ICD-10-CM | POA: Diagnosis not present

## 2023-06-27 LAB — CULTURE, FUNGUS WITHOUT SMEAR

## 2023-06-27 NOTE — Progress Notes (Signed)
 START OFF PATHWAY REGIMEN - Non-Small Cell Lung   [id number]:Carboplatin + Paclitaxel (2/50) + RT weekly x 6 weeks:   Administer weekly during RT:     Paclitaxel      Carboplatin   **Always confirm dose/schedule in your pharmacy ordering system**  Patient Characteristics: Preoperative or Nonsurgical Candidate (Clinical Staging), Distant Metastasis Therapeutic Status: Preoperative or Nonsurgical Candidate (Clinical Staging) AJCC T Category: cT4 AJCC N Category: cN2 AJCC M Category: cM1 AJCC 9 Stage Grouping: Unknown Check here if patient was staged using an edition other than AJCC Staging 9th Edition: false Intent of Therapy: Curative Intent, Discussed with Patient

## 2023-06-27 NOTE — Progress Notes (Signed)
 [facility] OFFICE PROGRESS NOTE   Diagnosis: Non-small cell lung cancer  INTERVAL HISTORY:   Raymond Hurst returns as scheduled.  He has chest pain when he coughs.  He notes blood-tinged sputum.  The amount of blood varies.  Hoarseness continues.  He has seen ENT.  A procedure is planned later this week.  He has not had the brain MRI.  Objective:  Vital signs in last 24 hours:  Blood pressure 121/73, pulse 90, temperature 97.9 F (36.6 C), temperature source Temporal, resp. rate 18, height 6' (1.829 m), weight 298 lb 3.2 oz (135.3 kg), SpO2 98%.    Resp: Distant breath sounds.  No respiratory distress. Cardio: Irregular. GI: No hepatosplenomegaly. Vascular: Both legs with firm edema. Neuro: Alert and oriented.   Lab Results:  Lab Results  Component Value Date   WBC 6.0 06/14/2023   HGB 11.2 (L) 06/14/2023   HCT 36.6 (L) 06/14/2023   MCV 85.1 06/14/2023   PLT 219 06/14/2023   NEUTROABS 5.0 06/13/2023    Imaging:  No results found.  Medications: I have reviewed the patient's current medications.  Assessment/Plan: Left lung mass CT chest 06/05/2023: Left upper lobe mass with invasion of the mediastinum and effacement of the left upper lobe and lower lobe pulmonary arteries, moderate left pleural effusion, small mediastinal lymph nodes, right adrenal mass favoring myolipoma, probable benign lucent lesion at the right scapula Diagnostic left thoracentesis 06/06/2023: Cytology-no malignant cells identified Bronchoscopy with endobronchial ultrasound and biopsies 06/12/2023-lateral left mainstem bronchial irregularity extending into the left upper lobe airways narrowing the left upper lobe airways, no discrete endobronchial lesion.  Endobronchial ultrasound showed a left hilar mass with significant vascularity; enlargement at station 4L, 11L, 7.  Station 7, 4L and 11L lymph nodes negative for malignancy; left hilar mass/lymph node metastatic carcinoma; left upper lobe  endobronchial brushing malignant cells present; left upper lobe endobronchial biopsy suspicious for malignancy; weak focal p63 positivity in the tumor cells, negative for TTF-1, synaptophysin and CD56, a poorly differentiated squamous cell carcinoma is favored; PD-L1, foundation 1 PET scan 06/20/2023-large left hilar mass primarily in the left upper lobe.  Left adrenal gland with hypermetabolic activity suspicious for early metastatic lesion.   2.  Hoarseness/cough secondary to #1 3.  Atrial fibrillation 4.  Lower extremity edema 5.  Alcohol use  Disposition: Raymond Hurst appears unchanged.  We reviewed the PET scan results/images with him and his wife at today's visit.  They understand the hypermetabolic activity involving the left adrenal gland is suspicious for metastatic disease.  They understand and agree with Dr. Enedina Harrow recommendation for concurrent radiation and Taxol/carboplatin chemotherapy, plus radiation to the left adrenal gland.  A referral was placed to radiation oncology.  We reviewed potential side effects associated with chemotherapy including bone marrow toxicity, nausea, mouth sores, constipation or diarrhea, hair loss, hypersensitivity reaction.  We discussed the potential for neuropathy, hearing loss and arthralgias/myalgias with Taxol.  We discussed various potential side effects associated with carboplatin.  He reports baseline hearing loss.  We are referring him to audiology for baseline evaluation.  He understands the rationale for the dexamethasone  premedication prior to cycle 1.  He will attend a chemotherapy education class.  We will coordinate timing of week 1 Taxol/carboplatin with radiation oncology and plan to see him in follow-up that day.  We will follow-up on foundation 1 and PD-L1 results as well as the upcoming brain MRI.  Patient seen with Dr. Scherrie Curt.   Diana Forster ANP/GNP-BC  06/27/2023  2:24 PM  This was a shared visit with Diana Forster.  Raymond Hurst was  interviewed and examined.  We reviewed the PET findings and images with Raymond Hurst and his wife.  His case was presented at the GI tumor conference on 06/22/2023.  The conference recommendation is to proceed with concurrent chemotherapy and radiation with radiation to the left adrenal mass.  He appears to have metastatic non-small cell lung cancer.  He understands the chance of undergoing curative therapy is small.  The treatment recommendation may change based on results of molecular/PD-L1 testing and the staging brain MRI.  I recommend paclitaxel/carboplatin to be given concurrent with radiation.  We reviewed potential toxicities associated with this regimen including the chance of ototoxicity.  He agrees to proceed.  He will attend a chemotherapy teaching class.  We made a referral to radiation oncology with the plan to begin treatment within the next few weeks.  A treatment plan was entered today.  I was present for greater than 50% of today's visit.  I performed Medical Decision Making.  Anise Kerns, MD

## 2023-06-28 ENCOUNTER — Other Ambulatory Visit: Payer: Self-pay

## 2023-06-28 ENCOUNTER — Encounter (HOSPITAL_COMMUNITY): Payer: Self-pay

## 2023-06-28 ENCOUNTER — Telehealth: Payer: Self-pay | Admitting: Radiation Oncology

## 2023-06-28 ENCOUNTER — Telehealth: Payer: Self-pay

## 2023-06-28 DIAGNOSIS — C3491 Malignant neoplasm of unspecified part of right bronchus or lung: Secondary | ICD-10-CM | POA: Diagnosis not present

## 2023-06-28 NOTE — Telephone Encounter (Signed)
 [facility] Clinical Social Work  Clinical Social Work was referred by new patient protocol for assessment of psychosocial needs.  Clinical Social Worker attempted to reach spouse first, per chart. VM box full. CSW attempted to reach patient, left VM with direct contact. CSW will attempt again.  Maudie Sorrow, LCSW  Clinical Social Worker [facility]

## 2023-06-28 NOTE — Telephone Encounter (Signed)
 Thank you.  Please call the patient on 5/29 to see how his cough, hemoptysis are doing.  If he is no longer seeing any bleeding then I think he should get back on the Eliquis 

## 2023-06-28 NOTE — Telephone Encounter (Signed)
Called patient to schedule a consultation w. Dr. Manning. No answer, LVM for a return call.  ?

## 2023-06-29 ENCOUNTER — Encounter (INDEPENDENT_AMBULATORY_CARE_PROVIDER_SITE_OTHER): Payer: Self-pay | Admitting: Otolaryngology

## 2023-06-29 ENCOUNTER — Telehealth: Payer: Self-pay | Admitting: Radiation Oncology

## 2023-06-29 ENCOUNTER — Ambulatory Visit (INDEPENDENT_AMBULATORY_CARE_PROVIDER_SITE_OTHER): Admitting: Otolaryngology

## 2023-06-29 VITALS — BP 114/75 | HR 101

## 2023-06-29 DIAGNOSIS — J383 Other diseases of vocal cords: Secondary | ICD-10-CM

## 2023-06-29 DIAGNOSIS — R490 Dysphonia: Secondary | ICD-10-CM

## 2023-06-29 DIAGNOSIS — J3800 Paralysis of vocal cords and larynx, unspecified: Secondary | ICD-10-CM

## 2023-06-29 MED ORDER — SULFAMETHOXAZOLE-TRIMETHOPRIM 800-160 MG PO TABS: 1.0000 | ORAL_TABLET | Freq: Two times a day (BID) | ORAL | 0 refills | Status: AC

## 2023-06-29 MED ORDER — METHYLPREDNISOLONE 4 MG PO TBPK: ORAL_TABLET | ORAL | 1 refills | Status: DC

## 2023-06-29 NOTE — Telephone Encounter (Signed)
 ATC pt number- went straight to vm, left message to call back. ATC wife- no answer, left vm to call back.

## 2023-06-29 NOTE — Telephone Encounter (Signed)
 Received incoming phone call from [name]. Pt is still coughing up blood as of this morning. Went to ENT today for throat procedure. Pt complains of dull upper lung pain when breathing in or coughing. Pt did not stop eliquis  due to wife's concerns of A-fib. Still taking Hycodan cough syrup, uses breztri  inhaler & doesn't notice any relieve.  Pt was called in Tramadol for chest pain but pt has not picked it up due to thinking it wont help. Pt does have borderline body aches & fatigue ness. No fevers or vomiting.   Dr. Baldwin Levee please advise.

## 2023-06-29 NOTE — Progress Notes (Signed)
 Therapeutic Vocal Cord Injection CPT 16109-60 ATTENDING: Artice Last, MD   PREOPERATIVE DIAGNOSIS(ES): 1. left vocal cord paralysis 2. Hoarseness; atrophy bilateral 3. Glottic insufficiency  POSTOPERATIVE DIAGNOSIS(ES): Same  PROCEDURE PERFORMED: Laryngoscopy with restylane injection into the bilateral lateral thyroarytenoid muscle(s)  INDICATIONS FOR PROCEDURE: The risks and benefits of the surgical procedure have been explained in detail to the patient and they have elected to proceed.  CONSENT:  Informed consent was obtained prior to the procedure after discussion of risks, benefits, and alternatives and expected outcomes were discussed with the patient; consent placed in chart. The possibilities of reaction to medication, pulmonary aspiration, bleeding, infection, the need for additional procedures, failure to diagnose a condition, and creating a complication requiring transfusion or operation were discussed with the patient. The patient concurred with the proposed plan, giving informed consent.    UNIVERSAL PROTOCOL/ TIMEOUT: Preprocedure verification is complete- patient verified and consents confirmed.  ANESTHESIA: local anesthesia H&P REVIEW: The patient's history and physical were reviewed today prior to procedure. All medications were reviewed and updated as well.  PROCEDURE DETAILS:  The patient was brought to the clinic and placed in a seated position.  The anterior neck skin was then cleansed with alcohol and 1% Lidocaine  was used to infiltrate the skin overlying the thyrohyoid membrane. Patient had a NEB with 4% lidocaine  prior the start of the procedure, to ensure good anesthesia and procedure tolerance. Afrin/Lidocaine  mixture was then used to anesthetize the nasal passages. The 1.5-inch 25-gauge needle was bent with two gentle curves in same direction.  The flexible laryngoscope was then passed through the patient's nasal passageway and advanced into the larynx. The  nasopharynx was free of any lesions. The scope was passed behind the soft palate and directed toward the base of tongue. The supraglottic structures were normal in appearance. The true folds show atrophy and left vocal fold paralysis. The 23-gauge needle was passed through the petiole and 4% lidocaine  was dripped on the cords while the patient phonated. It was then attached to a luer-lock syringe filled with the filler and advanced into the vocal fold, and injection was performed into the bilateral thyroarytenoid muscle(s) at a site just anterior to the vocal process. A second injection was performed at mid-fold. The augmentation carried out until some overmedialization was noted. This was then repeated on the contralateral side. The needle was then removed from the vocal fold and the airway. The scope was removed from the nasal cavity. This completed the procedure. The patient tolerated the procedure well.  IMPLANTS: Restylane-L Hyaluronic Acid  ESTIMATED BLOOD LOSS: None  SPECIMEN(S) REMOVED: None  DISPOSITION OF SPECIMEN(S): NA.  FINDINGS:  No evidence of a hematoma and the airway remained patent  CONDITION: Stable  COMPLICATIONS:The patient tolerated the procedure well without apparent complications and was ambulatory.   PLAN: RTC 3 weeks for repeat videostrobe and take Medrol  Pack and Bactrim after this procedure as directed.

## 2023-06-29 NOTE — Telephone Encounter (Signed)
Called patient to schedule a consultation w. Dr. Manning. No answer, LVM for a return call.  ?

## 2023-06-29 NOTE — Telephone Encounter (Signed)
 Per Veleria Germany patient's wife called to schedule consultation w. Dr. Lorri Rota, she stated she tries to minimize number of appointments for patient since it's hard for him to move. We offered telephone or mychart consult for 5/30 but she was adamant about making appointment next week. Patient's wife agreed to 6/3 CON & SIM with understanding that this may cause some delay in patient starting tx.

## 2023-06-30 ENCOUNTER — Ambulatory Visit
Admission: RE | Admit: 2023-06-30 | Discharge: 2023-06-30 | Disposition: A | Discharge status: Home / Self Care | Source: Ambulatory Visit | Attending: Nurse Practitioner | Admitting: Nurse Practitioner

## 2023-06-30 DIAGNOSIS — C3490 Malignant neoplasm of unspecified part of unspecified bronchus or lung: Secondary | ICD-10-CM | POA: Diagnosis not present

## 2023-06-30 DIAGNOSIS — Z85118 Personal history of other malignant neoplasm of bronchus and lung: Secondary | ICD-10-CM | POA: Diagnosis not present

## 2023-06-30 MED ORDER — GADOPICLENOL 0.5 MMOL/ML IV SOLN
10.0000 mL | Freq: Once | INTRAVENOUS | Status: AC | PRN
  Administered 2023-06-30: 10 mL via INTRAVENOUS

## 2023-06-30 NOTE — Telephone Encounter (Signed)
 Wife is requesting patient be called in a stronger pain med than Tramadol for his chest pain. Pts wife states he never complains of pain and when he does complain it is serious.  Dr. Baldwin Levee please advise as pt and wife is requesting pain med

## 2023-06-30 NOTE — Telephone Encounter (Signed)
 He needs to be seen and evaluated. If he is having CP and hemoptysis, then other issues may be at play here. He needs to go to ED or UC where they can evaluate him, check chest imaging etc

## 2023-06-30 NOTE — Telephone Encounter (Signed)
 The bleeding should slowly resolve (taking longer on the Eliquis ). Holding the Eliquis  for a few days would probably speed this up, if they are comfortable doing so.  If bleeding increases in amount, or if associated with any new symptoms like SOB, then he needs to be seen by us  as an acute OV or go to urgent care.

## 2023-06-30 NOTE — Telephone Encounter (Signed)
 Received incoming call from [name] (DPR). Pt has been scheduled for acute ov 6/2 with ND. Wife states pt is still coughing up blood and complaining of left upper lung pain. Wife states she does not want pt to come off eliquis  completely do to A-fib and is going to give pt 1 tablet instead of 2. I advised wife that pt needs to be seen in UC or ER if blood amount increases, worsening chest pain & sob. Wife verbalized understanding and had  no other concerns. Nothing further needed.    FYI Dr. Dione Franks pt scheduled for acute ov 6/2.

## 2023-06-30 NOTE — Telephone Encounter (Signed)
 Atc pt no answer- left message to call back. Atc wife no answer left message to call back.

## 2023-06-30 NOTE — Progress Notes (Signed)
 Pharmacist Chemotherapy Monitoring - Initial Assessment    Anticipated start date: 07/10/23   The following has been reviewed per standard work regarding the patient's treatment regimen: The patient's diagnosis, treatment plan and drug doses, and organ/hematologic function Lab orders and baseline tests specific to treatment regimen  The treatment plan start date, drug sequencing, and pre-medications Prior authorization status  Patient's documented medication list, including drug-drug interaction screen and prescriptions for anti-emetics and supportive care specific to the treatment regimen The drug concentrations, fluid compatibility, administration routes, and timing of the medications to be used The patient's access for treatment and lifetime cumulative dose history, if applicable  The patient's medication allergies and previous infusion related reactions, if applicable   Changes made to treatment plan:  N/A  Follow up needed:  PA pending   Jaymi Tinner Kreul, RPH, 06/30/2023  3:06 PM

## 2023-07-03 ENCOUNTER — Telehealth: Payer: Self-pay

## 2023-07-03 ENCOUNTER — Ambulatory Visit: Admitting: Internal Medicine

## 2023-07-03 ENCOUNTER — Encounter: Payer: Self-pay | Admitting: Internal Medicine

## 2023-07-03 VITALS — BP 121/74 | HR 79 | Ht 72.0 in | Wt 291.0 lb

## 2023-07-03 DIAGNOSIS — Z87891 Personal history of nicotine dependence: Secondary | ICD-10-CM | POA: Diagnosis not present

## 2023-07-03 DIAGNOSIS — J90 Pleural effusion, not elsewhere classified: Secondary | ICD-10-CM | POA: Diagnosis not present

## 2023-07-03 DIAGNOSIS — C3492 Malignant neoplasm of unspecified part of left bronchus or lung: Secondary | ICD-10-CM | POA: Diagnosis not present

## 2023-07-03 DIAGNOSIS — C7492 Malignant neoplasm of unspecified part of left adrenal gland: Secondary | ICD-10-CM | POA: Diagnosis not present

## 2023-07-03 DIAGNOSIS — R042 Hemoptysis: Secondary | ICD-10-CM | POA: Diagnosis not present

## 2023-07-03 DIAGNOSIS — C3412 Malignant neoplasm of upper lobe, left bronchus or lung: Secondary | ICD-10-CM | POA: Diagnosis not present

## 2023-07-03 MED ORDER — HYDROCODONE BIT-HOMATROP MBR 5-1.5 MG/5ML PO SOLN: 5.0000 mL | Freq: Four times a day (QID) | ORAL | 0 refills | Status: DC | PRN

## 2023-07-03 NOTE — Progress Notes (Signed)
 Raymond Hurst    [id number]    [birth year]  Primary Care Physician:[facility] Date of Appointment: 07/03/2023 Established Patient Visit  Chief complaint:   Chief Complaint  Patient presents with   Acute Visit    Hemoptysis and chest pain     HPI: Raymond Hurst is a 73 y.o. man with history of tobacco use disorder 40 pack year smoking history with large left lung mass consistent with SCC lung primary - with concern for metastases given lymph node involvement. CT Chest shows at least 10cm hilar mass which envelops pulmonary vasculature. Diagnosed by Dr Baldwin Levee.  Interval Updates: Here for acute visit for hemoptysis. Has not yet started chemotherapy or radiation. Due to start next week.   Notes hemoptysis for the last week about a coin sized amount, bright red. Does this about 4-5 times/day. No change in mucus amount, quantity.  Having trouble wearing his cpap because of this.   Was started on breztri  about a month ago and does not feel it has helped much.   No fevers, chills, night sweats or weight loss.   He is on eliquis  for atrial fibrillation. He is additionally on metoprolol . He is asymptomatic.    I have reviewed the patient's family social and past medical history and updated as appropriate.   Past Medical History:  Diagnosis Date   Adrenal tumor    right adrenal gland tumor favored to be a myelolipoma   Atrial fibrillation (HCC)    Cellulitis of right lower leg    Dysrhythmia    A-fib   History of TIA (transient ischemic attack)    May 2020   Lymphedema    OSA on CPAP    severe obstructive sleep apnea with an AHI of 54/h with no significant central events.  He had nocturnal hypoxemia with O2 sat nadir of 70%. Now on CPAP   Pulmonary mass    Stroke [facility])     Past Surgical History:  Procedure Laterality Date   BRONCHIAL BIOPSY  06/12/2023   Procedure: BRONCHOSCOPY, WITH BIOPSY;  Surgeon: Denson Flake, MD;  Location: [facility]  ENDOSCOPY;  Service: Cardiopulmonary;;   BRONCHIAL BRUSHINGS  06/12/2023   Procedure: BRONCHOSCOPY, WITH BRUSH BIOPSY;  Surgeon: Denson Flake, MD;  Location: [facility] ENDOSCOPY;  Service: Cardiopulmonary;;   BRONCHIAL NEEDLE ASPIRATION BIOPSY  06/12/2023   Procedure: BRONCHOSCOPY, WITH NEEDLE ASPIRATION BIOPSY;  Surgeon: Denson Flake, MD;  Location: [facility] ENDOSCOPY;  Service: Cardiopulmonary;;   HEMOSTASIS CLIP PLACEMENT  06/12/2023   Procedure: CONTROL OF HEMORRHAGE bronch cold saline and epi;  Surgeon: Denson Flake, MD;  Location: [facility] ENDOSCOPY;  Service: Cardiopulmonary;;   INNER EAR SURGERY     KNEE SURGERY Left    THORACENTESIS Left 06/06/2023   Procedure: THORACENTESIS;  Surgeon: Arlina Lair, MD;  Location: [facility] ENDOSCOPY;  Service: Pulmonary;  Laterality: Left;   TONSILLECTOMY     VIDEO BRONCHOSCOPY WITH ENDOBRONCHIAL ULTRASOUND Left 06/12/2023   Procedure: BRONCHOSCOPY, WITH EBUS;  Surgeon: Denson Flake, MD;  Location: [facility] ENDOSCOPY;  Service: Cardiopulmonary;  Laterality: Left;    Family History  Problem Relation Age of Onset   Heart failure Mother    COPD Mother    Atrial fibrillation Mother    Idiopathic pulmonary fibrosis Father     Social History   Occupational History   Not on file  Tobacco Use   Smoking status: Former    Current packs/day: 0.00  Average packs/day: 2.0 packs/day for 20.0 years (40.0 ttl pk-yrs)    Types: Cigarettes    Start date: 01/31/1970    Quit date: 01/31/1990    Years since quitting: 33.4   Smokeless tobacco: Never  Vaping Use   Vaping status: Never Used  Substance and Sexual Activity   Alcohol use: Yes    Comment: 4-5 beer daily   Drug use: No   Sexual activity: Not on file     Physical Exam: Blood pressure 121/74, pulse 79, height 6' (1.829 m), weight 291 lb (132 kg), SpO2 94%.  Gen:      No acute distress, elderly, chronically ill appearing ENT:  no nasal polyps, mucus membranes moist Lungs:  breath sounds diminished left lung  base CV:        irregularly irregular Ext: bilateral lower extremity edema, wrapped   Data Reviewed: Imaging: I have personally reviewed the CT Chest which shows left pleural effusion (performed after thoracentesis) with large >10 cm left hilar lung mass abutting pulmonary vasculature.   PFTs:     Latest Ref Rng & Units 04/17/2020   11:56 AM  PFT Results  FVC-Pre L 3.97   FVC-Predicted Pre % 84   FVC-Post L 3.91   FVC-Predicted Post % 83   Pre FEV1/FVC % % 76   Post FEV1/FCV % % 82   FEV1-Pre L 3.03   FEV1-Predicted Pre % 87   FEV1-Post L 3.19   DLCO uncorrected ml/min/mmHg 25.32   DLCO UNC% % 93   DLCO corrected ml/min/mmHg 25.32   DLCO COR %Predicted % 93   DLVA Predicted % 106   TLC L 6.21   TLC % Predicted % 85   RV % Predicted % 94    I have personally reviewed the patient's PFTs and no airflow limitation  Labs: Lab Results  Component Value Date   WBC 6.0 06/14/2023   HGB 11.2 (L) 06/14/2023   HCT 36.6 (L) 06/14/2023   MCV 85.1 06/14/2023   PLT 219 06/14/2023   Lab Results  Component Value Date   NA 138 06/14/2023   K 4.1 06/14/2023   CO2 26 06/14/2023   GLUCOSE 99 06/14/2023   BUN 5 (L) 06/14/2023   CREATININE 0.67 06/14/2023   CALCIUM  8.5 (L) 06/14/2023   EGFR 97 06/01/2023   GFRNONAA >60 06/14/2023    Immunization status: Immunization History  Administered Date(s) Administered   Fluzone Influenza virus vaccine,trivalent (IIV3), split virus 11/03/2014   Influenza Split 10/21/2008, 03/12/2013   Influenza, High Dose Seasonal PF 02/07/2018, 02/07/2023   Influenza,inj,Quad PF,6+ Mos 02/08/2018   Moderna Sars-Covid-2 Vaccination 04/24/2019, 05/22/2019, 02/04/2020   PNEUMOCOCCAL CONJUGATE-20 05/25/2021   Td 03/10/1997   Td (Adult) 03/10/1997   Tdap 10/22/2008, 05/25/2021    External Records Personally Reviewed: oncology, pulmonary  Assessment:  SCC of the lung, left, metastatic Left pleural effusion,  recurrent Hemoptysis  Plan/Recommendations:  Your are coughing up blood because of the cancer. I have refilled the cough syrup with codeine to help. Treating the cancer with chemotherapy and radiation is the best solution.   At this time because the amount of blood is small, We have to weight the risks and benefits of treatment with eliquis . You also have a risk for stroke. Therefore for now I recommend continuing the full dose eliquis .   If the blood is increasing in frequency or amount (such as a cup full) I recommend you go to the emergency room and stop the eliquis .   You  have recurrent fluid building up around the left side of your lung. I suspect this is contributing to your shortness of breath. It is most likely from the cancer. A pleurx catheter is one option to manage this fluid. The other option is recurrent drainage in radiology.   I spent 30 minutes on 07/03/2023 in care of this patient including face to face time and non-face to face time spent charting, review of outside records, and coordination of care.  Return to Care: Return in about 3 months (around 10/03/2023).   Louie Rover, MD Pulmonary and Critical Care Medicine [facility] Office:[phone]

## 2023-07-03 NOTE — Telephone Encounter (Signed)
 Copied from CRM [id number]. Topic: Clinical - Medical Advice >> Jul 03, 2023 12:34 PM Isabell A wrote: Reason for CRM: [name], spouse is calling to discuss patients visit today - states patient is hearing impaired and she was unable to come to his visit with him today.  Callback number: [phone]  ATC [name] per DPR, no answer- left message to call back.

## 2023-07-03 NOTE — Progress Notes (Addendum)
 GU Location of Tumor / Histology: Left Adrenal Gland(metastatic lesion).  Additional Dx:  Large left hilar mass primarily in the left upper lobe   Raymond Hurst presented as referral from Dr. Coni Deep [facility])  Biopsies  Dr. Coni Deep 06/12/2023 Bronchoscopy with endobronchial ultrasound and biopsies:  lateral left mainstem bronchial irregularity extending into the left upper lobe airways narrowing the left upper lobe airways, no discrete endobronchial lesion.  Endobronchial ultrasound showed a left hilar mass with significant vascularity; enlargement at station 4L, 11L, 7.  Station 7, 4L and 11L lymph nodes negative for malignancy; left hilar mass/lymph node metastatic carcinoma; left upper lobe endobronchial brushing malignant cells present; left upper lobe endobronchial biopsy suspicious for malignancy; weak focal p63 positivity in the tumor cells, negative for TTF-1, synaptophysin and CD56, a poorly differentiated squamous cell carcinoma is favored; PD-L1, foundation 1  06/20/2023 Raymond Hurst. Raymond Bannister, NP NM PET Image Initial (PI) Skull Base to Thigh CLINICAL DATA:  Initial treatment strategy for non-small cell lung cancer.   IMPRESSION: 1. Large (11.1 cm in long axis) left hilar mass primarily in the left upper lobe extends from the AP window all the way to the left upper lobe pleural margin and abuts the major fissure with associated hypermetabolic activity, compatible with malignancy. 2. Thickening of the genu of the left adrenal gland demonstrates hypermetabolic activity with maximum SUV 6.1 suspicious for early metastatic lesion of the left adrenal gland. The contralateral (right) adrenal gland demonstrates a fatty lesion favoring benign right myelolipoma. 3. Mildly loculated left pleural fluid effusion indeterminate for malignancy. 4. Progressive consolidation or atelectasis in the left upper lobe. Increased ground-glass opacity and volume loss in  the left lower lobe. 5. Small left hilar lymph nodes are not pathologically enlarged but have maximum SUV 2.4, below blood pool levels. A subcarinal node adjacent to the esophagus is not pathologically enlarged but has a maximum SUV of 4.2, mildly above blood pool, this could be reactive or neoplastic. 6. Mildly enlarged right inguinal lymph nodes are enlarged but not hypermetabolic. 7. Symmetric soft palate activity with maximum SUV 7.2 on the right and 8.1 on the left, without specific associated CT abnormality. This is most likely physiologic. Similarly there is a small amount of activity along the muscular tissues of the right anterior tongue with maximum SUV 5.6, and no CT correlate, most likely physiologic. 8. Chronic L1 compression fracture. 9.  Aortic Atherosclerosis (ICD10-I70.0).  Past/Anticipated interventions by urology, if any: NA  Past/Anticipated interventions by medical oncology, if any:     Weight changes, if any:  Yes,  20 lbs in 6 months.  Respiratory complaints,  if any:  Yes, SOB with exertion.  Hemoptysis: Yes, blood and phlegm.  Bowel/Bladder complaints, if any:  Urinary frequency and feels like him has to psh or strain to get flow.  Nausea/Vomiting, if any: No  Pain issues, if any:  No, may have some discomfort when coughing.  SAFETY ISSUES: Prior radiation?  No Pacemaker/ICD? No Possible current pregnancy? Male Is the patient on methotrexate? No  Current Complaints / other details:

## 2023-07-03 NOTE — Patient Instructions (Addendum)
 It was a pleasure to see you today!  Please schedule follow up with myself in 3 months.  If my schedule is not open yet, we will contact you with a reminder closer to that time. Please call [phone] if you haven't heard from us  a month before, and always call us  sooner if issues or concerns arise. You can also send us  a message through MyChart, but but aware that this is not to be used for urgent issues and it may take up to 5-7 days to receive a reply. Please be aware that you will likely be able to view your results before I have a chance to respond to them. Please give us  5 business days to respond to any non-urgent results.   Your are coughing up blood because of the cancer. I have refilled the cough syrup with codeine to help. Treating the cancer with chemotherapy and radiation is the best solution.   At this time because the amount of blood is small, We have to weight the risks and benefits of treatment with eliquis . You also have a risk for stroke. Therefore for now I recommend continuing the full dose eliquis .   If the blood is increasing in frequency or amount (such as a cup full) I recommend you go to the emergency room and stop the eliquis .   You have recurrent fluid building up around the left side of your lung. I suspect this is contributing to your shortness of breath. It is most likely from the cancer. A pleurx catheter is one option to manage this fluid. The other option is recurrent drainage in radiology.

## 2023-07-04 ENCOUNTER — Telehealth: Payer: Self-pay

## 2023-07-04 ENCOUNTER — Ambulatory Visit
Admission: RE | Admit: 2023-07-04 | Discharge: 2023-07-04 | Disposition: A | Discharge status: Home / Self Care | Source: Ambulatory Visit | Attending: Radiation Oncology | Admitting: Radiation Oncology

## 2023-07-04 ENCOUNTER — Encounter: Payer: Self-pay | Admitting: Radiation Oncology

## 2023-07-04 VITALS — BP 118/79 | HR 84 | Temp 97.1°F | Resp 18 | Ht 72.0 in | Wt 289.1 lb

## 2023-07-04 DIAGNOSIS — I700 Atherosclerosis of aorta: Secondary | ICD-10-CM | POA: Diagnosis not present

## 2023-07-04 DIAGNOSIS — G4730 Sleep apnea, unspecified: Secondary | ICD-10-CM | POA: Diagnosis not present

## 2023-07-04 DIAGNOSIS — C3412 Malignant neoplasm of upper lobe, left bronchus or lung: Secondary | ICD-10-CM | POA: Insufficient documentation

## 2023-07-04 DIAGNOSIS — C7972 Secondary malignant neoplasm of left adrenal gland: Secondary | ICD-10-CM | POA: Diagnosis not present

## 2023-07-04 DIAGNOSIS — Z7901 Long term (current) use of anticoagulants: Secondary | ICD-10-CM | POA: Diagnosis not present

## 2023-07-04 DIAGNOSIS — Z510 Encounter for antineoplastic radiation therapy: Secondary | ICD-10-CM | POA: Insufficient documentation

## 2023-07-04 DIAGNOSIS — Z8673 Personal history of transient ischemic attack (TIA), and cerebral infarction without residual deficits: Secondary | ICD-10-CM | POA: Diagnosis not present

## 2023-07-04 DIAGNOSIS — J90 Pleural effusion, not elsewhere classified: Secondary | ICD-10-CM | POA: Insufficient documentation

## 2023-07-04 DIAGNOSIS — I517 Cardiomegaly: Secondary | ICD-10-CM | POA: Diagnosis not present

## 2023-07-04 DIAGNOSIS — Z5111 Encounter for antineoplastic chemotherapy: Secondary | ICD-10-CM | POA: Insufficient documentation

## 2023-07-04 DIAGNOSIS — Z87891 Personal history of nicotine dependence: Secondary | ICD-10-CM | POA: Diagnosis not present

## 2023-07-04 DIAGNOSIS — M47814 Spondylosis without myelopathy or radiculopathy, thoracic region: Secondary | ICD-10-CM | POA: Diagnosis not present

## 2023-07-04 DIAGNOSIS — I4891 Unspecified atrial fibrillation: Secondary | ICD-10-CM | POA: Diagnosis not present

## 2023-07-04 DIAGNOSIS — Z79899 Other long term (current) drug therapy: Secondary | ICD-10-CM | POA: Insufficient documentation

## 2023-07-04 DIAGNOSIS — C7492 Malignant neoplasm of unspecified part of left adrenal gland: Secondary | ICD-10-CM

## 2023-07-04 NOTE — Telephone Encounter (Signed)
 RN called and spoke with Lemond Quail (wife) informed her of the scan results showed no evidence of disease in the brain and that we will proceed with treatment as planned.

## 2023-07-04 NOTE — Progress Notes (Signed)
  Radiation Oncology         [phone]) [phone] ________________________________  Name: Raymond Hurst MRN: [id number]  Date: 07/04/2023  DOB: 1950-08-14  SIMULATION AND TREATMENT PLANNING NOTE    ICD-10-CM   1. Malignant neoplasm of upper lobe of left lung (HCC)  C34.12       DIAGNOSIS:  73 yo man with stage cT4, cN2, cM1 Lung cancer of the left upper lobe   NARRATIVE:  The patient was brought to the CT Simulation planning suite.  Identity was confirmed.  All relevant records and images related to the planned course of therapy were reviewed.  The patient freely provided informed written consent to proceed with treatment after reviewing the details related to the planned course of therapy. The consent form was witnessed and verified by the simulation staff.  Then, the patient was set-up in a stable reproducible  supine position for radiation therapy.  CT images were obtained.  Surface markings were placed.  The CT images were loaded into the planning software.  Then the target and avoidance structures were contoured.  Treatment planning then occurred.  The radiation prescription was entered and confirmed.  Then, I designed and supervised the construction of a total of 6 medically necessary complex treatment devices, including a BodyFix immobilization mold custom fitted to the patient along with 5 multileaf collimators conformally shaped radiation around the treatment target while shielding critical structures such as the heart and spinal cord maximally.  I have requested : 3D Simulation  I have requested a DVH of the following structures: Left lung, right lung, spinal cord, heart, esophagus, and target.  I have ordered:Nutrition Consult  SPECIAL TREATMENT PROCEDURE:  The planned course of therapy using radiation constitutes a special treatment procedure. Special care is required in the management of this patient for the following reasons.  The patient will be receiving concurrent chemotherapy requiring  careful monitoring for increased toxicities of treatment including periodic laboratory values.  The special nature of the planned course of radiotherapy will require increased physician supervision and oversight to ensure patient's safety with optimal treatment outcomes.  PLAN:  The patient will receive 70 Gy in 35 fractions.  ________________________________  Trilby Fujisawa Lorri Rota, M.D.

## 2023-07-04 NOTE — Progress Notes (Signed)
 Radiation Oncology         [phone]) [phone] ________________________________  Initial outpatient Consultation  Name: Raymond Hurst MRN: [id number]  Date of Service: 07/04/2023 DOB: 07-06-1950  WU:[id number], [facility], Maurine Sovereign, MD   REFERRING PHYSICIAN: Sumner Ends, MD  DIAGNOSIS: The encounter diagnosis was Malignant neoplasm of left adrenal gland, unspecified part (HCC).    ICD-10-CM   1. Malignant neoplasm of left adrenal gland, unspecified part (HCC)  C74.92       HISTORY OF PRESENT ILLNESS: Raymond Hurst is a 73 y.o. male seen at the request of Dr. Scherrie Curt. He is followed by cardiology for a history of permanent A-fib. At follow up in 05/2023, the patient and his wife reported he had a productive cough for the last 4-6 months, with development of changes in his voice recently. This prompted a chest x-ray, which was performed the following day and revealed: large area of mass-like consolidation within left upper lobe, with associated left hilar prominence suggesting underlying adenopathy; small left pleural effusion. Given this and worsening shortness of breath, he was recommended to go to the ED on 06/05/23 for further work up. He underwent an angio chest CT in the ED showing: 10.9 cm LUL mass that invades mediastinum in vicinity of hilum and AP window, causing effacement or occlusion of portions of LUL pulmonary artery and narrowing of LLL pulmonary artery; moderate left pleural effusion. Of note, a thoracentesis was completed the following day, and pleural fluid showed no malignant cells. He was evaluated by pulmonology and medical oncology during admission.   He was taken for bronchoscopy with biopsies on 06/12/23 under Dr. Baldwin Levee. Cytology from the left hilar mass/lymph node confirmed metastatic carcinoma. The material was not diagnostic but may favor a poorly differentiated squamous cell carcinoma. Additional LUL endobronchial samples also showed malignant cells.  Of note, lymph nodes biopsies from stations 7, 4L and 11L were all benign. He also completed a staging PET scan on 06/20/23 showing: hypermetabolic 11.1 cm left hilar mass primarily in LUL extends from AP window to LUL pleural margin and abuts major fissure; thickening of genu of left adrenal gland with hypermetabolic activity, suspicious for early metastatic lesion; mildly loculated left pleural effusion, indeterminate for malignancy.  PREVIOUS RADIATION THERAPY: No  PAST MEDICAL HISTORY:  Past Medical History:  Diagnosis Date   Adrenal tumor    right adrenal gland tumor favored to be a myelolipoma   Atrial fibrillation (HCC)    Cellulitis of right lower leg    Dysrhythmia    A-fib   History of TIA (transient ischemic attack)    May 2020   Lymphedema    OSA on CPAP    severe obstructive sleep apnea with an AHI of 54/h with no significant central events.  He had nocturnal hypoxemia with O2 sat nadir of 70%. Now on CPAP   Pulmonary mass    Stroke [facility])       PAST SURGICAL HISTORY: Past Surgical History:  Procedure Laterality Date   BRONCHIAL BIOPSY  06/12/2023   Procedure: BRONCHOSCOPY, WITH BIOPSY;  Surgeon: Denson Flake, MD;  Location: [facility] ENDOSCOPY;  Service: Cardiopulmonary;;   BRONCHIAL BRUSHINGS  06/12/2023   Procedure: BRONCHOSCOPY, WITH BRUSH BIOPSY;  Surgeon: Denson Flake, MD;  Location: [facility] ENDOSCOPY;  Service: Cardiopulmonary;;   BRONCHIAL NEEDLE ASPIRATION BIOPSY  06/12/2023   Procedure: BRONCHOSCOPY, WITH NEEDLE ASPIRATION BIOPSY;  Surgeon: Denson Flake, MD;  Location: [facility] ENDOSCOPY;  Service: Cardiopulmonary;;  HEMOSTASIS CLIP PLACEMENT  06/12/2023   Procedure: CONTROL OF HEMORRHAGE bronch cold saline and epi;  Surgeon: Denson Flake, MD;  Location: [facility] ENDOSCOPY;  Service: Cardiopulmonary;;   INNER EAR SURGERY     KNEE SURGERY Left    THORACENTESIS Left 06/06/2023   Procedure: THORACENTESIS;  Surgeon: Arlina Lair, MD;  Location: [facility] ENDOSCOPY;  Service:  Pulmonary;  Laterality: Left;   TONSILLECTOMY     VIDEO BRONCHOSCOPY WITH ENDOBRONCHIAL ULTRASOUND Left 06/12/2023   Procedure: BRONCHOSCOPY, WITH EBUS;  Surgeon: Denson Flake, MD;  Location: [facility] ENDOSCOPY;  Service: Cardiopulmonary;  Laterality: Left;    FAMILY HISTORY:  Family History  Problem Relation Age of Onset   Heart failure Mother    COPD Mother    Atrial fibrillation Mother    Idiopathic pulmonary fibrosis Father     SOCIAL HISTORY:  Social History   Socioeconomic History   Marital status: Married    Spouse name: Not on file   Number of children: Not on file   Years of education: Not on file   Highest education level: Not on file  Occupational History   Not on file  Tobacco Use   Smoking status: Former    Current packs/day: 0.00    Average packs/day: 2.0 packs/day for 20.0 years (40.0 ttl pk-yrs)    Types: Cigarettes    Start date: 01/31/1970    Quit date: 01/31/1990    Years since quitting: 33.4   Smokeless tobacco: Never  Vaping Use   Vaping status: Never Used  Substance and Sexual Activity   Alcohol use: Yes    Comment: 4-5 beer daily   Drug use: No   Sexual activity: Not on file  Other Topics Concern   Not on file  Social History Narrative   Right handed.    Social Drivers of Corporate investment banker Strain: High Risk (06/15/2023)   Overall Financial Resource Strain (CARDIA)    Difficulty of Paying Living Expenses: Very hard  Food Insecurity: No Food Insecurity (07/04/2023)   Hunger Vital Sign    Worried About Running Out of Food in the Last Year: Never true    Ran Out of Food in the Last Year: Never true  Recent Concern: Food Insecurity - Food Insecurity Present (06/15/2023)   Hunger Vital Sign    Worried About Running Out of Food in the Last Year: Sometimes true    Ran Out of Food in the Last Year: Sometimes true  Transportation Needs: No Transportation Needs (07/04/2023)   PRAPARE - Administrator, Civil Service (Medical): No     Lack of Transportation (Non-Medical): No  Physical Activity: Inactive (06/15/2023)   Exercise Vital Sign    Days of Exercise per Week: 0 days    Minutes of Exercise per Session: 0 min  Stress: No Stress Concern Present (06/15/2023)   [employer] of Occupational Health - Occupational Stress Questionnaire    Feeling of Stress : Only a little  Social Connections: Unknown (06/13/2023)   Social Connection and Isolation Panel [NHANES]    Frequency of Communication with Friends and Family: More than three times a week    Frequency of Social Gatherings with Friends and Family: Three times a week    Attends Religious Services: Patient declined    Active Member of Clubs or Organizations: Patient declined    Attends Banker Meetings: Patient declined    Marital Status: Married  Catering manager Violence: Not At Risk (07/04/2023)  Humiliation, Afraid, Rape, and Kick questionnaire    Fear of Current or Ex-Partner: No    Emotionally Abused: No    Physically Abused: No    Sexually Abused: No    ALLERGIES: Patient has no known allergies.  MEDICATIONS:  Current Outpatient Medications  Medication Sig Dispense Refill   acetaminophen  (TYLENOL ) 500 MG tablet Take 500 mg by mouth every 6 (six) hours as needed for mild pain.     allopurinol  (ZYLOPRIM ) 100 MG tablet Take 100 mg by mouth daily.     apixaban  (ELIQUIS ) 5 MG TABS tablet Take 1 tablet (5 mg total) by mouth 2 (two) times daily. Okay to restart this medication on 06/13/2023     budesonide-glycopyrrolate-formoterol (BREZTRI  AEROSPHERE) 160-9-4.8 MCG/ACT AERO inhaler Inhale 2 puffs into the lungs in the morning and at bedtime.     furosemide  (LASIX ) 40 MG tablet Take 1 tablet (40 mg total) by mouth daily. 90 tablet 3   HYDROcodone  bit-homatropine (HYCODAN) 5-1.5 MG/5ML syrup Take 5 mLs by mouth every 6 (six) hours as needed for cough. 240 mL 0   methylPREDNISolone  (MEDROL  DOSEPAK) 4 MG TBPK tablet Take with signs of chronic sinusitis  and take as directed 1 each 1   metoprolol  tartrate (LOPRESSOR ) 25 MG tablet Take 1 tablet (25 mg total) by mouth 2 (two) times daily. 180 tablet 3   sulfamethoxazole -trimethoprim  (BACTRIM  DS) 800-160 MG tablet Take 1 tablet by mouth 2 (two) times daily for 5 days. 10 tablet 0   levalbuterol  (XOPENEX  HFA) 45 MCG/ACT inhaler Inhale 2 puffs into the lungs every 4 (four) hours as needed for wheezing. (Patient not taking: Reported on 07/04/2023)     No current facility-administered medications for this encounter.    REVIEW OF SYSTEMS:  On review of systems, the patient reports that he is doing well overall. He denies any chest pain, shortness of breath, cough, fevers, chills, night sweats, unintended weight changes. He denies any bowel or bladder disturbances, and denies abdominal pain, nausea or vomiting. He denies any new musculoskeletal or joint aches or pains.*** A complete review of systems is obtained and is otherwise negative.    PHYSICAL EXAM:  Wt Readings from Last 3 Encounters:  07/04/23 289 lb 2 oz (131.1 kg)  07/03/23 291 lb (132 kg)  06/27/23 298 lb 3.2 oz (135.3 kg)   Temp Readings from Last 3 Encounters:  07/04/23 (!) 97.1 F (36.2 C) (Temporal)  06/27/23 97.9 F (36.6 C) (Temporal)  06/15/23 98.2 F (36.8 C) (Temporal)   BP Readings from Last 3 Encounters:  07/04/23 118/79  07/03/23 121/74  06/29/23 114/75   Pulse Readings from Last 3 Encounters:  07/04/23 84  07/03/23 79  06/29/23 (!) 101   Pain Assessment Pain Score: 0-No pain/10  In general this is a well appearing *** man in no acute distress. He's alert and oriented x4 and appropriate throughout the examination. Cardiopulmonary assessment is negative for acute distress and he exhibits normal effort.     KPS = ***  100 - Normal; no complaints; no evidence of disease. 90   - Able to carry on normal activity; minor signs or symptoms of disease. 80   - Normal activity with effort; some signs or symptoms of  disease. 86   - Cares for self; unable to carry on normal activity or to do active work. 60   - Requires occasional assistance, but is able to care for most of his personal needs. 50   - Requires considerable assistance and  frequent medical care. 40   - Disabled; requires special care and assistance. 30   - Severely disabled; hospital admission is indicated although death not imminent. 20   - Very sick; hospital admission necessary; active supportive treatment necessary. 10   - Moribund; fatal processes progressing rapidly. 0     - Dead  Karnofsky DA, Abelmann WH, Craver LS and Burchenal JH [phone]) The use of the nitrogen mustards in the palliative treatment of carcinoma: with particular reference to bronchogenic carcinoma Cancer 1 [id number]  LABORATORY DATA:  Lab Results  Component Value Date   WBC 6.0 06/14/2023   HGB 11.2 (L) 06/14/2023   HCT 36.6 (L) 06/14/2023   MCV 85.1 06/14/2023   PLT 219 06/14/2023   Lab Results  Component Value Date   NA 138 06/14/2023   K 4.1 06/14/2023   CL 104 06/14/2023   CO2 26 06/14/2023   Lab Results  Component Value Date   ALT 12 06/13/2023   AST 17 06/13/2023   ALKPHOS 61 06/13/2023   BILITOT 0.5 06/13/2023     RADIOGRAPHY: MR BRAIN W WO CONTRAST Result Date: 07/04/2023 CLINICAL DATA:  History of lung cancer, staging study. Evaluation for metastatic disease. EXAM: MRI HEAD WITHOUT AND WITH CONTRAST TECHNIQUE: Multiplanar, multiecho pulse sequences of the brain and surrounding structures were obtained without and with intravenous contrast. CONTRAST:  10 mL of Vueway  intravenous contrast COMPARISON:  PET CT 06/20/2023.  MRI head 06/08/2018. FINDINGS: Brain: No acute infarct. White matter is unremarkable. Similar punctate focus of susceptibility in the inferior aspect of the right cerebellum. Extra-axial focus of fluid within the left aspect of the posterior fossa which may reflect an arachnoid cyst. No midline shift or edema. Normal appearance of  midline structures. The basilar cisterns are patent. No extra-axial fluid collections. No abnormal enhancement. Ventricles: Prominence of the lateral ventricles suggestive of underlying parenchymal volume loss. Vascular: Skull base flow voids are visualized. Skull and upper cervical spine: No focal abnormality. Sinuses/Orbits: Orbits are symmetric. Paranasal sinuses are clear. Other: Mastoid air cells are clear. IMPRESSION: No evidence of intracranial metastatic disease. No acute intracranial abnormality. Similar chronic microhemorrhage in the right cerebellum. Mild parenchymal volume loss. Electronically Signed   By: Denny Flack M.D.   On: 07/04/2023 14:04   NM PET Image Initial (PI) Skull Base To Thigh Result Date: 06/21/2023 CLINICAL DATA:  Initial treatment strategy for non-small cell lung cancer. EXAM: NUCLEAR MEDICINE PET SKULL BASE TO THIGH TECHNIQUE: 14.7 mCi F-18 FDG was injected intravenously. Full-ring PET imaging was performed from the skull base to thigh after the radiotracer. CT data was obtained and used for attenuation correction and anatomic localization. Fasting blood glucose: 98 mg/dl COMPARISON:  Multiple exams, including 06/13/2023 CT scan FINDINGS: Mediastinal blood pool activity: SUV max 2.9 Liver activity: SUV max NA NECK: Symmetric soft palate activity with maximum SUV 7.2 on the right and 8.1 on the left, without specific associated CT abnormality. This is most likely physiologic. Similarly there is a small amount of activity along the muscular tissues of the right anterior tongue with maximum SUV 5.6, and no CT correlate, most likely physiologic. Incidental CT findings: Stable questionable posterior fossa arachnoid cyst eccentric to the left. Left mastoidectomy. CHEST: Left hilar mass primarily in the left upper lobe extends from the AP window all the way to the left upper lobe pleural margin and abuts the major fissure with associated hypermetabolic activity measuring 11.1 by 6.3 cm  M with maximum SUV of 32.5, compatible with  malignancy. Mildly loculated left pleural fluid effusion indeterminate for malignancy. Small left hilar lymph nodes are present including a 0.7 cm node on image 59 series 4 with maximum SUV 2.4, below blood pool levels. A subcarinal node adjacent to the esophagus is not pathologically enlarged but has a maximum SUV of 4.2, mildly above blood pool, this could be reactive or neoplastic. Other small mediastinal lymph nodes are not overtly hypermetabolic. Incidental CT findings: Progressive consolidation or atelectasis in the left upper lobe. Increased ground-glass opacity in volume loss in the left lower lobe. ABDOMEN/PELVIS: Thickening of the genu of the left adrenal gland at 0.7 cm on image 114 series 4 demonstrates hypermetabolic activity with maximum SUV 6.1 suspicious for early metastatic lesion of the left adrenal gland. Fatty elements of right adrenal mass compatible with right-sided adrenal myelolipoma. No hypermetabolic activity associated with the right adrenal gland. A mildly enlarged 1.9 cm right inguinal lymph node on image 208 series 4 has maximum SUV of 2.3 which is below blood pool. The adjacent 1.7 cm right inguinal lymph node on image 205 series 4 has a maximum SUV of 2 point 4 which is below blood pool. Both nodes are enlarged but not hypermetabolic. Incidental CT findings: Bilateral simple photopenic renal cysts. Mildly distended urinary bladder with a left bladder diverticulum. Atherosclerosis is present, including aortoiliac atherosclerotic disease. Widespread colonic diverticulosis dense calcifications in the prostate gland. SKELETON: Faintly accentuated activity along a suspected Schmorl's node along the right posterosuperior endplate of L3, maximum SUV 4.3. Incidental CT findings: Chronic L1 compression fracture. IMPRESSION: 1. Large (11.1 cm in long axis) left hilar mass primarily in the left upper lobe extends from the AP window all the way to the  left upper lobe pleural margin and abuts the major fissure with associated hypermetabolic activity, compatible with malignancy. 2. Thickening of the genu of the left adrenal gland demonstrates hypermetabolic activity with maximum SUV 6.1 suspicious for early metastatic lesion of the left adrenal gland. The contralateral (right) adrenal gland demonstrates a fatty lesion favoring benign right myelolipoma. 3. Mildly loculated left pleural fluid effusion indeterminate for malignancy. 4. Progressive consolidation or atelectasis in the left upper lobe. Increased ground-glass opacity and volume loss in the left lower lobe. 5. Small left hilar lymph nodes are not pathologically enlarged but have maximum SUV 2.4, below blood pool levels. A subcarinal node adjacent to the esophagus is not pathologically enlarged but has a maximum SUV of 4.2, mildly above blood pool, this could be reactive or neoplastic. 6. Mildly enlarged right inguinal lymph nodes are enlarged but not hypermetabolic. 7. Symmetric soft palate activity with maximum SUV 7.2 on the right and 8.1 on the left, without specific associated CT abnormality. This is most likely physiologic. Similarly there is a small amount of activity along the muscular tissues of the right anterior tongue with maximum SUV 5.6, and no CT correlate, most likely physiologic. 8. Chronic L1 compression fracture. 9.  Aortic Atherosclerosis (ICD10-I70.0). Electronically Signed   By: Freida Jes M.D.   On: 06/21/2023 09:41   CT Chest W Contrast Result Date: 06/13/2023 CLINICAL DATA:  Dizziness and tachycardia EXAM: CT CHEST WITH CONTRAST TECHNIQUE: Multidetector CT imaging of the chest was performed during intravenous contrast administration. RADIATION DOSE REDUCTION: This exam was performed according to the departmental dose-optimization program which includes automated exposure control, adjustment of the mA and/or kV according to patient size and/or use of iterative  reconstruction technique. CONTRAST:  75mL OMNIPAQUE  IOHEXOL  300 MG/ML  SOLN COMPARISON:  Plain  film from earlier in the same day, CT from 06/05/2023 FINDINGS: Cardiovascular: Atherosclerotic calcifications of the thoracic aorta are noted. No aneurysmal dilatation or dissection is seen. The pulmonary artery as visualized is within normal limits although not timed for embolus evaluation. Attenuation of left upper lobe pulmonary arterial branches is seen secondary to the known central left hilar mass. Coronary calcifications are noted. No cardiac enlargement is seen. Mediastinum/Nodes: The esophagus is within normal limits. Thoracic inlet is unremarkable. Scattered small mediastinal nodes are seen. Lungs/Pleura: Large left hilar mass lesion is noted which measures at least 10 cm in greatest dimension similar to that seen on the prior exam. It extends to the pleural margin. Attenuation of the left upper lobe bronchial tree as well as the left upper lobe pulmonary arteries is noted secondary to the mass. Associated pleural effusion is seen. The right lung remains clear. Upper Abdomen: Visualized upper abdomen shows a fatty lesion arising from the right adrenal gland measuring 7.1 cm. This is most consistent with a myelolipoma. Musculoskeletal: No acute rib abnormality is noted. Degenerative changes of the thoracic spine are seen. L1 compression fracture is noted. IMPRESSION: Stable appearing left hilar mass radiating to the pleural margin. The overall appearance is similar to that seen on the prior exam. Associated pleural effusion is noted. No definitive pulmonary emboli or aortic dissection is noted. Electronically Signed   By: Violeta Grey M.D.   On: 06/13/2023 03:31   DG Chest Port 1 View Result Date: 06/13/2023 CLINICAL DATA:  Dyspnea. EXAM: PORTABLE CHEST 1 VIEW COMPARISON:  Jun 06, 2023 FINDINGS: The cardiac silhouette is mildly enlarged and unchanged in size. Mild to moderate severity atelectasis and/or  infiltrate is seen within the left lung base. An ill-defined hazy opacity is seen overlying the left upper lobe. There is a small left pleural effusion. No pneumothorax is identified. Multilevel degenerative changes seen throughout the thoracic spine. IMPRESSION: 1. Mild to moderate severity left basilar atelectasis and/or infiltrate. 2. Small left pleural effusion. 3. Ill-defined hazy opacity overlying the left upper lobe which corresponds to the left upper lobe lung mass seen on prior chest CT (Jun 05, 2023). Electronically Signed   By: Virgle Grime M.D.   On: 06/13/2023 01:22   DG Chest 2 View Result Date: 06/06/2023 CLINICAL DATA:  Status post thoracentesis EXAM: CHEST - 2 VIEW COMPARISON:  Jun 01, 2023 FINDINGS: Post thoracentesis without evidence of pneumothorax No change in the left upper lobe mass lesion compared with prior chest x-ray and prior CT Jun 05, 2023. Right lung clear Heart normal size IMPRESSION: Post thoracentesis without evidence of pneumothorax Electronically Signed   By: Fredrich Jefferson M.D.   On: 06/06/2023 15:59   CT Angio Chest PE W/Cm &/Or Wo Cm Result Date: 06/05/2023 CLINICAL DATA:  Shortness of breath.  Left upper lobe mass. * Tracking Code: BO * EXAM: CT ANGIOGRAPHY CHEST WITH CONTRAST TECHNIQUE: Multidetector CT imaging of the chest was performed using the standard protocol during bolus administration of intravenous contrast. Multiplanar CT image reconstructions and MIPs were obtained to evaluate the vascular anatomy. RADIATION DOSE REDUCTION: This exam was performed according to the departmental dose-optimization program which includes automated exposure control, adjustment of the mA and/or kV according to patient size and/or use of iterative reconstruction technique. CONTRAST:  75mL OMNIPAQUE  IOHEXOL  350 MG/ML SOLN COMPARISON:  Chest radiograph 06/01/2023 FINDINGS: Despite efforts by the technologist and patient, motion artifact is present on today's exam and could not be  eliminated. This reduces exam sensitivity  and specificity. Cardiovascular: Left upper lobe branches of the left pulmonary artery are effaced or occluded due to a 10.9 by 5.6 by 7.2 cm left upper lobe mass which invades the aorta pulmonary window and mediastinum. This also causes narrowing of the left lower lobe pulmonary artery and pulmonary artery invasion by tumor is not completely excluded. Mass abuts the left lateral pleural margin and obviously extends across the mediastinal pleural margin. There is a moderate left pleural effusion malignant effusion is a distinct possibility. Separate from this mass effect causing narrowing and occlusion of portions of the left upper lobe pulmonary artery, I do not see a separate pulmonary embolus. Lower lobe pulmonary arteries are somewhat obscured by motion artifact. Coronary, aortic arch, and branch vessel atherosclerotic vascular disease. Moderate cardiomegaly. Mediastinum/Nodes: 1.1 cm right paratracheal lymph node on image 41 series 5. Additional clustered right lower paratracheal lymph nodes are present. The dominant left upper lobe mass invades the mediastinum in the vicinity of the hilum and AP window. Along the posterior mediastinum, there are hypodense nodules in the paraspinal location especially along the right, query extramedullary hematopoiesis. Lungs/Pleura: Occlusion of the left superior lobar bronchus by the large left upper lobe mass. The lingular bronchus is narrowed but not occluded. Patchy ground-glass opacities in the remainder of the left upper lobe and left lower lobe with passive atelectasis of much of the left lower lobe. Hazy ground-glass density in the right lung is present as well, raising the possibility of pulmonary edema. Upper Abdomen: 7.1 by 4.7 cm right adrenal mass with some fatty elements favoring myelolipoma. Musculoskeletal: Thoracic spondylosis. Lucent lesion along the right scapular neck is probably benign. Review of the MIP images  confirms the above findings. IMPRESSION: 1. 10.9 by 5.6 by 7.2 cm left upper lobe mass which invades the mediastinum in the vicinity of the hilum and AP window. This causes effacement or occlusion of portions of the left upper lobe pulmonary artery and narrowing of the left lower lobe pulmonary artery. Pulmonary artery invasion by tumor is not completely excluded. The mass is compatible with bronchogenic carcinoma. Pulmonology and oncology consultations may be indicated for tissue sampling further workup. 2. Moderate left pleural effusion, malignant effusion is a distinct possibility. 3. No obvious filling defect in the visible pulmonary arterial tree to indicate typical pulmonary embolus, with the understanding that part of the left upper lobe pulmonary arterial tree is occluded by the mass, and portions of the lower lobe pulmonary arterial tree bilaterally are obscured by motion artifact. 4. Patchy ground-glass opacities in the remainder of the left upper lobe and left lower lobe with passive atelectasis of much of the left lower lobe. Hazy ground-glass density in the right lung is present as well, raising the possibility of pulmonary edema. 5. Moderate cardiomegaly. 6. Coronary, aortic arch, and branch vessel atherosclerotic vascular disease. Aortic Atherosclerosis (ICD10-I70.0). 7. 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 8. Lucent lesion along the right scapular neck is probably benign. Electronically Signed   By: Freida Jes M.D.   On: 06/05/2023 17:44      IMPRESSION/PLAN: 1. 73 y.o. man with left hilar mass***  Today, we talked to the patient and family about the findings and workup thus far. We discussed the natural history of lung cancer and general treatment, highlighting the role of radiotherapy in the management. We discussed the available radiation techniques, and focused on the details and logistics of delivery. We reviewed the anticipated acute and  late  sequelae associated with radiation in this setting. The patient was encouraged to ask questions that were answered to his satisfaction.  At the end of our discussion, the patient ***   I personally spent *** minutes in this encounter including chart review, reviewing radiological studies, meeting face-to-face with the patient, entering orders and completing documentation.    Arta Bihari, PA-C    Kenith Payer, MD  [facility]  Radiation Oncology Direct Dial: [phone]  Fax: [phone] [email or web address]  Skype  LinkedIn   This document serves as a record of services personally performed by Kenith Payer, MD and Keitha Pata, PA-C. It was created on their behalf by Florance Hun, a trained medical scribe. The creation of this record is based on the scribe's personal observations and the provider's statements to them. This document has been checked and approved by the attending provider.

## 2023-07-05 ENCOUNTER — Encounter: Payer: Self-pay | Admitting: *Deleted

## 2023-07-05 ENCOUNTER — Other Ambulatory Visit: Payer: Self-pay

## 2023-07-05 DIAGNOSIS — C7972 Secondary malignant neoplasm of left adrenal gland: Secondary | ICD-10-CM | POA: Diagnosis not present

## 2023-07-05 DIAGNOSIS — C7970 Secondary malignant neoplasm of unspecified adrenal gland: Secondary | ICD-10-CM | POA: Insufficient documentation

## 2023-07-05 DIAGNOSIS — Z87891 Personal history of nicotine dependence: Secondary | ICD-10-CM | POA: Diagnosis not present

## 2023-07-05 DIAGNOSIS — C3412 Malignant neoplasm of upper lobe, left bronchus or lung: Secondary | ICD-10-CM | POA: Diagnosis not present

## 2023-07-05 DIAGNOSIS — C801 Malignant (primary) neoplasm, unspecified: Secondary | ICD-10-CM | POA: Insufficient documentation

## 2023-07-05 DIAGNOSIS — Z510 Encounter for antineoplastic radiation therapy: Secondary | ICD-10-CM | POA: Diagnosis not present

## 2023-07-05 DIAGNOSIS — Z5111 Encounter for antineoplastic chemotherapy: Secondary | ICD-10-CM | POA: Diagnosis not present

## 2023-07-06 ENCOUNTER — Encounter: Payer: Self-pay | Admitting: Oncology

## 2023-07-06 ENCOUNTER — Telehealth: Payer: Self-pay | Admitting: *Deleted

## 2023-07-06 NOTE — Telephone Encounter (Signed)
 Left Message - Called to schedule pt education session prior to treatment start on 6/9. Left voicemail for return call

## 2023-07-07 ENCOUNTER — Other Ambulatory Visit: Payer: Self-pay | Admitting: *Deleted

## 2023-07-07 ENCOUNTER — Encounter: Payer: Self-pay | Admitting: *Deleted

## 2023-07-07 ENCOUNTER — Other Ambulatory Visit: Payer: Self-pay | Admitting: Acute Care

## 2023-07-07 DIAGNOSIS — R0609 Other forms of dyspnea: Secondary | ICD-10-CM

## 2023-07-07 MED ORDER — PROCHLORPERAZINE MALEATE 10 MG PO TABS: 10.0000 mg | ORAL_TABLET | Freq: Four times a day (QID) | ORAL | 0 refills | Status: DC | PRN

## 2023-07-07 MED ORDER — LEVALBUTEROL TARTRATE 45 MCG/ACT IN AERO: 2.0000 | INHALATION_SPRAY | RESPIRATORY_TRACT | Status: DC | PRN

## 2023-07-07 MED ORDER — ONDANSETRON HCL 8 MG PO TABS: 8.0000 mg | ORAL_TABLET | Freq: Three times a day (TID) | ORAL | 0 refills | Status: DC | PRN

## 2023-07-07 MED ORDER — DEXAMETHASONE 2 MG PO TABS: 10.0000 mg | ORAL_TABLET | ORAL | 0 refills | Status: DC

## 2023-07-07 NOTE — Progress Notes (Signed)
 PATIENT NAVIGATOR PROGRESS NOTE  Name: LINCOLN GINLEY Date: 07/07/2023 MRN: [id number]  DOB: 17-Oct-1950   Reason for visit:  Telephone call to discuss upcoming tx  Comments:  Called and spoke with wife [name] and she stated that they were not able to come in for pt ed session prior to Monday. We will meet during visit on Monday prior to first treatment  Reviewed premed Dexamethasone  that Mr Udovich should take 10mg  at 10pm night before first treatment and then at 6 am morning of first treatment.    She also asked about [facility] for treatment and will discuss on Monday but he will have first treatment peripherally. She verbalized understanding    Time spent counseling/coordinating care: 45-60 minutes

## 2023-07-09 ENCOUNTER — Other Ambulatory Visit: Payer: Self-pay | Admitting: Oncology

## 2023-07-10 ENCOUNTER — Inpatient Hospital Stay: Admitting: Nutrition

## 2023-07-10 ENCOUNTER — Inpatient Hospital Stay: Admitting: Nurse Practitioner

## 2023-07-10 ENCOUNTER — Encounter: Payer: Self-pay | Admitting: Nurse Practitioner

## 2023-07-10 ENCOUNTER — Other Ambulatory Visit

## 2023-07-10 ENCOUNTER — Inpatient Hospital Stay

## 2023-07-10 VITALS — BP 109/72 | HR 100 | Temp 97.7°F | Resp 18 | Wt 285.4 lb

## 2023-07-10 VITALS — BP 121/73 | HR 86 | Temp 98.2°F | Resp 18

## 2023-07-10 DIAGNOSIS — Z5111 Encounter for antineoplastic chemotherapy: Secondary | ICD-10-CM | POA: Diagnosis not present

## 2023-07-10 DIAGNOSIS — C3412 Malignant neoplasm of upper lobe, left bronchus or lung: Secondary | ICD-10-CM

## 2023-07-10 DIAGNOSIS — Z510 Encounter for antineoplastic radiation therapy: Secondary | ICD-10-CM | POA: Diagnosis not present

## 2023-07-10 DIAGNOSIS — C3432 Malignant neoplasm of lower lobe, left bronchus or lung: Secondary | ICD-10-CM | POA: Insufficient documentation

## 2023-07-10 DIAGNOSIS — C7972 Secondary malignant neoplasm of left adrenal gland: Secondary | ICD-10-CM | POA: Insufficient documentation

## 2023-07-10 LAB — CBC WITH DIFFERENTIAL (CANCER CENTER ONLY)
Abs Immature Granulocytes: 0.03 10*3/uL (ref 0.00–0.07)
Basophils Absolute: 0 10*3/uL (ref 0.0–0.1)
Basophils Relative: 0 %
Eosinophils Absolute: 0 10*3/uL (ref 0.0–0.5)
Eosinophils Relative: 0 %
HCT: 38 % — ABNORMAL LOW (ref 39.0–52.0)
Hemoglobin: 12.2 g/dL — ABNORMAL LOW (ref 13.0–17.0)
Immature Granulocytes: 0 %
Lymphocytes Relative: 6 %
Lymphs Abs: 0.4 10*3/uL — ABNORMAL LOW (ref 0.7–4.0)
MCH: 26.5 pg (ref 26.0–34.0)
MCHC: 32.1 g/dL (ref 30.0–36.0)
MCV: 82.4 fL (ref 80.0–100.0)
Monocytes Absolute: 0.1 10*3/uL (ref 0.1–1.0)
Monocytes Relative: 1 %
Neutro Abs: 6.3 10*3/uL (ref 1.7–7.7)
Neutrophils Relative %: 93 %
Platelet Count: 337 10*3/uL (ref 150–400)
RBC: 4.61 MIL/uL (ref 4.22–5.81)
RDW: 16.8 % — ABNORMAL HIGH (ref 11.5–15.5)
WBC Count: 6.8 10*3/uL (ref 4.0–10.5)
nRBC: 0 % (ref 0.0–0.2)

## 2023-07-10 LAB — CMP (CANCER CENTER ONLY)
ALT: 15 U/L (ref 0–44)
AST: 14 U/L — ABNORMAL LOW (ref 15–41)
Albumin: 3.2 g/dL — ABNORMAL LOW (ref 3.5–5.0)
Alkaline Phosphatase: 70 U/L (ref 38–126)
Anion gap: 12 (ref 5–15)
BUN: 13 mg/dL (ref 8–23)
CO2: 22 mmol/L (ref 22–32)
Calcium: 9.4 mg/dL (ref 8.9–10.3)
Chloride: 100 mmol/L (ref 98–111)
Creatinine: 0.78 mg/dL (ref 0.61–1.24)
GFR, Estimated: >60 mL/min (ref >60)
Glucose, Bld: 198 mg/dL — ABNORMAL HIGH (ref 70–99)
Potassium: 4.4 mmol/L (ref 3.5–5.1)
Sodium: 135 mmol/L (ref 135–145)
Total Bilirubin: 0.5 mg/dL (ref 0.0–1.2)
Total Protein: 7.1 g/dL (ref 6.5–8.1)

## 2023-07-10 MED ORDER — SODIUM CHLORIDE 0.9 % IV SOLN
250.0000 mg | Freq: Once | INTRAVENOUS | Status: AC
  Administered 2023-07-10: 250 mg via INTRAVENOUS
  Filled 2023-07-10: qty 25

## 2023-07-10 MED ORDER — DEXAMETHASONE SODIUM PHOSPHATE 10 MG/ML IJ SOLN
10.0000 mg | Freq: Once | INTRAMUSCULAR | Status: AC
  Administered 2023-07-10: 10 mg via INTRAVENOUS
  Filled 2023-07-10: qty 1

## 2023-07-10 MED ORDER — SODIUM CHLORIDE 0.9 % IV SOLN
50.0000 mg/m2 | Freq: Once | INTRAVENOUS | Status: AC
  Administered 2023-07-10: 132 mg via INTRAVENOUS
  Filled 2023-07-10: qty 22

## 2023-07-10 MED ORDER — PALONOSETRON HCL INJECTION 0.25 MG/5ML
0.2500 mg | Freq: Once | INTRAVENOUS | Status: AC
  Administered 2023-07-10: 0.25 mg via INTRAVENOUS
  Filled 2023-07-10: qty 5

## 2023-07-10 MED ORDER — FAMOTIDINE IN NACL 20-0.9 MG/50ML-% IV SOLN
20.0000 mg | Freq: Once | INTRAVENOUS | Status: AC
  Administered 2023-07-10: 20 mg via INTRAVENOUS
  Filled 2023-07-10: qty 50

## 2023-07-10 MED ORDER — DIPHENHYDRAMINE HCL 50 MG/ML IJ SOLN
50.0000 mg | Freq: Once | INTRAMUSCULAR | Status: AC
  Administered 2023-07-10: 50 mg via INTRAVENOUS
  Filled 2023-07-10: qty 1

## 2023-07-10 MED ORDER — SODIUM CHLORIDE 0.9 % IV SOLN: INTRAVENOUS | Status: DC

## 2023-07-10 NOTE — Progress Notes (Signed)
 73 year old male diagnosed with NSCLC and followed by Dr. Scherrie Curt. Today is his first treatment with Carboplatin/Taxol. He will have radiation treatments under the care of Dr. Lorri Rota.  PMH includes Atrial Fibrillation, Alcohol use, baseline hearing loss.  Medications include Lasix , Vitamin B1, Decadron , Zofran , Compazine , Vitamin D .  Labs include Glucose 198 and albumin 3.2.  Height: 6'. Weight: 285 pounds 6.4 oz. June 9.   BMI: 38.71. Weight: 303 pounds 12.8 oz March 30. Weight: 344 pounds May 10, 2022.  ~17% weight loss over 1 year. (Skewed with edema?)  Patient denies nausea, vomiting, constipation and diarrhea. Lower extremity edema on both legs. Current weight reflects edema. He has poor dentition but is eating normally for him. Usually just eats one big meal around 4 pm. He may have snacks in the evening. He wonders if he should drink a protein shake/oral nutrition supplement.  Nutrition Diagnosis: Food and Nutrition Related Knowledge Deficit related to cancer and associated treatments as evidenced by no prior need for nutrition related information.  Intervention: Educated to consume small frequent meals/snacks throughout the day. Small meal prior to radiation is recommended. Recommend Ensure Max or equivalent once daily to provide additional 30 grams protein and 150-160 calories. Provided coupons and samples. Educated on high protein foods and importance to consume 5-6 times daily. Provided nutrition education sheets with contact information.  Monitoring, Evaluation, Goals: Tolerate adequate calories and protein for maintenance of lean body mass.  Next Visit: To be scheduled as needed. Patient encouraged to call RD with questions.

## 2023-07-10 NOTE — Progress Notes (Signed)
   Cancer Center OFFICE PROGRESS NOTE   Diagnosis: Non-small cell lung cancer  INTERVAL HISTORY:   Mr. Juday returns as scheduled.  He is seen prior to proceeding with cycle 1 weekly Taxol/carboplatin.  He is scheduled to begin radiation tomorrow.  Hoarseness is better for the vocal cord procedure.  Overall he is feeling better.  He denies nausea.  Continued cough.  No hemoptysis.  Stable dyspnea on exertion.  No constipation or diarrhea.  He confirms taking dexamethasone  premedication last night and this morning.  Objective:  Vital signs in last 24 hours:  Blood pressure 109/72, pulse 100, temperature 97.7 F (36.5 C), temperature source Temporal, resp. rate 18, weight 285 lb 6.4 oz (129.5 kg), SpO2 96%.    HEENT: No thrush or ulcers. Resp: Distant breath sounds.  No respiratory distress. Cardio: Irregular. GI: No hepatosplenomegaly. Vascular: Firm edema both legs. Neuro: Alert and oriented.   Lab Results:  Lab Results  Component Value Date   WBC 6.8 07/10/2023   HGB 12.2 (L) 07/10/2023   HCT 38.0 (L) 07/10/2023   MCV 82.4 07/10/2023   PLT 337 07/10/2023   NEUTROABS 6.3 07/10/2023    Imaging:  No results found.  Medications: I have reviewed the patient's current medications.  Assessment/Plan: Left lung mass CT chest 06/05/2023: Left upper lobe mass with invasion of the mediastinum and effacement of the left upper lobe and lower lobe pulmonary arteries, moderate left pleural effusion, small mediastinal lymph nodes, right adrenal mass favoring myolipoma, probable benign lucent lesion at the right scapula Diagnostic left thoracentesis 06/06/2023: Cytology-no malignant cells identified Bronchoscopy with endobronchial ultrasound and biopsies 06/12/2023-lateral left mainstem bronchial irregularity extending into the left upper lobe airways narrowing the left upper lobe airways, no discrete endobronchial lesion.  Endobronchial ultrasound showed a left hilar mass with  significant vascularity; enlargement at station 4L, 11L, 7.  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 PET scan 06/20/2023-large left hilar mass primarily in the left upper lobe.  Left adrenal gland with hypermetabolic activity suspicious for early metastatic lesion. Brain MRI 06/30/2023-negative for metastatic disease Week 1 Taxol/carboplatin 07/10/2023 Radiation chest 07/11/2023; plan for simultaneous treatment left adrenal metastasis with 5 fractions of SBRT   2.  Hoarseness/cough secondary to #1 3.  Atrial fibrillation 4.  Lower extremity edema 5.  Alcohol use  Disposition: Mr. Dieudonne appears stable.  He is scheduled to begin treatment today with weekly Taxol/carboplatin chemotherapy.  We again reviewed potential toxicities.  He confirms taking dexamethasone  as prescribed last night and this morning.  He agrees to proceed.  He is scheduled to begin the course of radiation tomorrow.  CBC and chemistry panel reviewed.  Labs adequate to proceed as above.  He will return for follow-up and treatment in 1 week.  We are available to see him sooner if needed.    Diana Forster ANP/GNP-BC   07/10/2023  8:52 AM

## 2023-07-10 NOTE — Progress Notes (Signed)
 Patient seen by Lonna Cobb NP today  Vitals are within treatment parameters:Yes   Labs are within treatment parameters: Yes   Treatment plan has been signed: No   Per physician team, Patient is ready for treatment and there are NO modifications to the treatment plan.

## 2023-07-10 NOTE — Progress Notes (Signed)
 PATIENT NAVIGATOR PROGRESS NOTE  Name: BOSTEN NEWSTROM Date: 07/10/2023 MRN: [id number]  DOB: 04/12/1950   Reason for visit:  Patient education session and first treatment  Comments:  Please see education note and distress screening    Time spent counseling/coordinating care: > 60 minutes

## 2023-07-10 NOTE — Patient Instructions (Addendum)
 [facility] - A DEPT OF [facility]  Discharge Instructions: Thank you for choosing [facility] to provide your oncology and hematology care.   If you have a lab appointment with the Cancer Center, please go directly to the Cancer Center and check in at the registration area.   Wear comfortable clothing and clothing appropriate for easy access to any Portacath or PICC line.   We strive to give you quality time with your provider. You may need to reschedule your appointment if you arrive late (15 or more minutes).  Arriving late affects you and other patients whose appointments are after yours.  Also, if you miss three or more appointments without notifying the office, you may be dismissed from the clinic at the provider's discretion.      For prescription refill requests, have your pharmacy contact our office and allow 72 hours for refills to be completed.    Today you received the following chemotherapy and/or immunotherapy agents taxol/carboplatin.      To help prevent nausea and vomiting after your treatment, we encourage you to take your nausea medication as directed.  BELOW ARE SYMPTOMS THAT SHOULD BE REPORTED IMMEDIATELY: *FEVER GREATER THAN 100.4 F (38 C) OR HIGHER *CHILLS OR SWEATING *NAUSEA AND VOMITING THAT IS NOT CONTROLLED WITH YOUR NAUSEA MEDICATION *UNUSUAL SHORTNESS OF BREATH *UNUSUAL BRUISING OR BLEEDING *URINARY PROBLEMS (pain or burning when urinating, or frequent urination) *BOWEL PROBLEMS (unusual diarrhea, constipation, pain near the anus) TENDERNESS IN MOUTH AND THROAT WITH OR WITHOUT PRESENCE OF ULCERS (sore throat, sores in mouth, or a toothache) UNUSUAL RASH, SWELLING OR PAIN  UNUSUAL VAGINAL DISCHARGE OR ITCHING   Items with * indicate a potential emergency and should be followed up as soon as possible or go to the Emergency Department if any problems should occur.  Please show the CHEMOTHERAPY ALERT CARD or  IMMUNOTHERAPY ALERT CARD at check-in to the Emergency Department and triage nurse.  Should you have questions after your visit or need to cancel or reschedule your appointment, please contact [facility] - A DEPT OF [facility]  Dept: [phone]  and follow the prompts.  Office hours are 8:00 a.m. to 4:30 p.m. Monday - Friday. Please note that voicemails left after 4:00 p.m. may not be returned until the following business day.  We are closed weekends and major holidays. You have access to a nurse at all times for urgent questions. Please call the main number to the clinic Dept: [phone] and follow the prompts.   For any non-urgent questions, you may also contact your provider using MyChart. We now offer e-Visits for anyone 64 and older to request care online for non-urgent symptoms. For details visit [email or web address].   Also download the MyChart app! Go to the app store, search "MyChart", open the app, select [facility], and log in with your MyChart username and password.  Paclitaxel Injection What is this medication? PACLITAXEL (PAK li TAX el) treats some types of cancer. It works by slowing down the growth of cancer cells. This medicine may be used for other purposes; ask your health care provider or pharmacist if you have questions. COMMON BRAND NAME(S): Onxol, Taxol What should I tell my care team before I take this medication? They need to know if you have any of these conditions: Heart disease Liver disease Low white blood cell levels An unusual or allergic reaction to paclitaxel, other medications, foods, dyes, or preservatives If  you or your partner are pregnant or trying to get pregnant Breast-feeding How should I use this medication? This medication is injected into a vein. It is given by your care team in a hospital or clinic setting. Talk to your care team about the use of this medication in children. While it may be given to children  for selected conditions, precautions do apply. Overdosage: If you think you have taken too much of this medicine contact a poison control center or emergency room at once. NOTE: This medicine is only for you. Do not share this medicine with others. What if I miss a dose? Keep appointments for follow-up doses. It is important not to miss your dose. Call your care team if you are unable to keep an appointment. What may interact with this medication? Do not take this medication with any of the following: Live virus vaccines Other medications may affect the way this medication works. Talk with your care team about all of the medications you take. They may suggest changes to your treatment plan to lower the risk of side effects and to make sure your medications work as intended. This list may not describe all possible interactions. Give your health care provider a list of all the medicines, herbs, non-prescription drugs, or dietary supplements you use. Also tell them if you smoke, drink alcohol, or use illegal drugs. Some items may interact with your medicine. What should I watch for while using this medication? Your condition will be monitored carefully while you are receiving this medication. You may need blood work while taking this medication. This medication may make you feel generally unwell. This is not uncommon as chemotherapy can affect healthy cells as well as cancer cells. Report any side effects. Continue your course of treatment even though you feel ill unless your care team tells you to stop. This medication can cause serious allergic reactions. To reduce the risk, your care team may give you other medications to take before receiving this one. Be sure to follow the directions from your care team. This medication may increase your risk of getting an infection. Call your care team for advice if you get a fever, chills, sore throat, or other symptoms of a cold or flu. Do not treat yourself. Try  to avoid being around people who are sick. This medication may increase your risk to bruise or bleed. Call your care team if you notice any unusual bleeding. Be careful brushing or flossing your teeth or using a toothpick because you may get an infection or bleed more easily. If you have any dental work done, tell your dentist you are receiving this medication. Talk to your care team if you may be pregnant. Serious birth defects can occur if you take this medication during pregnancy. Talk to your care team before breastfeeding. Changes to your treatment plan may be needed. What side effects may I notice from receiving this medication? Side effects that you should report to your care team as soon as possible: Allergic reactions--skin rash, itching, hives, swelling of the face, lips, tongue, or throat Heart rhythm changes--fast or irregular heartbeat, dizziness, feeling faint or lightheaded, chest pain, trouble breathing Increase in blood pressure Infection--fever, chills, cough, sore throat, wounds that don't heal, pain or trouble when passing urine, general feeling of discomfort or being unwell Low blood pressure--dizziness, feeling faint or lightheaded, blurry vision Low red blood cell level--unusual weakness or fatigue, dizziness, headache, trouble breathing Painful swelling, warmth, or redness of the skin, blisters or  sores at the infusion site Pain, tingling, or numbness in the hands or feet Slow heartbeat--dizziness, feeling faint or lightheaded, confusion, trouble breathing, unusual weakness or fatigue Unusual bruising or bleeding Side effects that usually do not require medical attention (report to your care team if they continue or are bothersome): Diarrhea Hair loss Joint pain Loss of appetite Muscle pain Nausea Vomiting This list may not describe all possible side effects. Call your doctor for medical advice about side effects. You may report side effects to FDA at  1-800-FDA-1088. Where should I keep my medication? This medication is given in a hospital or clinic. It will not be stored at home. NOTE: This sheet is a summary. It may not cover all possible information. If you have questions about this medicine, talk to your doctor, pharmacist, or health care provider.  2024 Elsevier/Gold Standard (2021-06-08 00:00:00)  Carboplatin Injection What is this medication? CARBOPLATIN (KAR boe pla tin) treats some types of cancer. It works by slowing down the growth of cancer cells. This medicine may be used for other purposes; ask your health care provider or pharmacist if you have questions. COMMON BRAND NAME(S): Paraplatin What should I tell my care team before I take this medication? They need to know if you have any of these conditions: Blood disorders Hearing problems Kidney disease Recent or ongoing radiation therapy An unusual or allergic reaction to carboplatin, cisplatin, other medications, foods, dyes, or preservatives Pregnant or trying to get pregnant Breast-feeding How should I use this medication? This medication is injected into a vein. It is given by your care team in a hospital or clinic setting. Talk to your care team about the use of this medication in children. Special care may be needed. Overdosage: If you think you have taken too much of this medicine contact a poison control center or emergency room at once. NOTE: This medicine is only for you. Do not share this medicine with others. What if I miss a dose? Keep appointments for follow-up doses. It is important not to miss your dose. Call your care team if you are unable to keep an appointment. What may interact with this medication? Medications for seizures Some antibiotics, such as amikacin, gentamicin, neomycin, streptomycin, tobramycin Vaccines This list may not describe all possible interactions. Give your health care provider a list of all the medicines, herbs,  non-prescription drugs, or dietary supplements you use. Also tell them if you smoke, drink alcohol, or use illegal drugs. Some items may interact with your medicine. What should I watch for while using this medication? Your condition will be monitored carefully while you are receiving this medication. You may need blood work while taking this medication. This medication may make you feel generally unwell. This is not uncommon, as chemotherapy can affect healthy cells as well as cancer cells. Report any side effects. Continue your course of treatment even though you feel ill unless your care team tells you to stop. In some cases, you may be given additional medications to help with side effects. Follow all directions for their use. This medication may increase your risk of getting an infection. Call your care team for advice if you get a fever, chills, sore throat, or other symptoms of a cold or flu. Do not treat yourself. Try to avoid being around people who are sick. Avoid taking medications that contain aspirin , acetaminophen , ibuprofen, naproxen, or ketoprofen unless instructed by your care team. These medications may hide a fever. Be careful brushing or flossing your teeth  or using a toothpick because you may get an infection or bleed more easily. If you have any dental work done, tell your dentist you are receiving this medication. Talk to your care team if you wish to become pregnant or think you might be pregnant. This medication can cause serious birth defects. Talk to your care team about effective forms of contraception. Do not breast-feed while taking this medication. What side effects may I notice from receiving this medication? Side effects that you should report to your care team as soon as possible: Allergic reactions--skin rash, itching, hives, swelling of the face, lips, tongue, or throat Infection--fever, chills, cough, sore throat, wounds that don't heal, pain or trouble when passing  urine, general feeling of discomfort or being unwell Low red blood cell level--unusual weakness or fatigue, dizziness, headache, trouble breathing Pain, tingling, or numbness in the hands or feet, muscle weakness, change in vision, confusion or trouble speaking, loss of balance or coordination, trouble walking, seizures Unusual bruising or bleeding Side effects that usually do not require medical attention (report to your care team if they continue or are bothersome): Hair loss Nausea Unusual weakness or fatigue Vomiting This list may not describe all possible side effects. Call your doctor for medical advice about side effects. You may report side effects to FDA at 1-800-FDA-1088. Where should I keep my medication? This medication is given in a hospital or clinic. It will not be stored at home. NOTE: This sheet is a summary. It may not cover all possible information. If you have questions about this medicine, talk to your doctor, pharmacist, or health care provider.  2024 Elsevier/Gold Standard (2021-05-11 00:00:00)

## 2023-07-11 ENCOUNTER — Ambulatory Visit
Admission: RE | Admit: 2023-07-11 | Discharge: 2023-07-11 | Disposition: A | Discharge status: Home / Self Care | Source: Ambulatory Visit | Attending: Radiation Oncology

## 2023-07-11 ENCOUNTER — Other Ambulatory Visit: Payer: Self-pay

## 2023-07-11 ENCOUNTER — Telehealth: Payer: Self-pay

## 2023-07-11 DIAGNOSIS — C7972 Secondary malignant neoplasm of left adrenal gland: Secondary | ICD-10-CM | POA: Diagnosis not present

## 2023-07-11 DIAGNOSIS — Z87891 Personal history of nicotine dependence: Secondary | ICD-10-CM | POA: Diagnosis not present

## 2023-07-11 DIAGNOSIS — Z5111 Encounter for antineoplastic chemotherapy: Secondary | ICD-10-CM | POA: Diagnosis not present

## 2023-07-11 DIAGNOSIS — Z510 Encounter for antineoplastic radiation therapy: Secondary | ICD-10-CM | POA: Diagnosis not present

## 2023-07-11 DIAGNOSIS — C3412 Malignant neoplasm of upper lobe, left bronchus or lung: Secondary | ICD-10-CM | POA: Diagnosis not present

## 2023-07-11 LAB — RAD ONC ARIA SESSION SUMMARY
Course Elapsed Days: 0
Plan Fractions Treated to Date: 1
Plan Prescribed Dose Per Fraction: 2 Gy
Plan Total Fractions Prescribed: 35
Plan Total Prescribed Dose: 70 Gy
Reference Point Dosage Given to Date: 2 Gy
Reference Point Session Dosage Given: 2 Gy
Session Number: 1

## 2023-07-11 NOTE — Telephone Encounter (Signed)
 24 Hour Call Back Reached out to patient regarding first time treatment 07/10/2023. Patient stated he was feeling good, with no side effects present at this time. Educated patient to call if he has any questions or concerns.

## 2023-07-12 ENCOUNTER — Ambulatory Visit
Admission: RE | Admit: 2023-07-12 | Discharge: 2023-07-12 | Disposition: A | Discharge status: Home / Self Care | Source: Ambulatory Visit | Attending: Radiation Oncology | Admitting: Radiation Oncology

## 2023-07-12 ENCOUNTER — Other Ambulatory Visit: Payer: Self-pay

## 2023-07-12 DIAGNOSIS — Z87891 Personal history of nicotine dependence: Secondary | ICD-10-CM | POA: Diagnosis not present

## 2023-07-12 DIAGNOSIS — C3412 Malignant neoplasm of upper lobe, left bronchus or lung: Secondary | ICD-10-CM | POA: Diagnosis not present

## 2023-07-12 DIAGNOSIS — Z5111 Encounter for antineoplastic chemotherapy: Secondary | ICD-10-CM | POA: Diagnosis not present

## 2023-07-12 DIAGNOSIS — C7972 Secondary malignant neoplasm of left adrenal gland: Secondary | ICD-10-CM | POA: Diagnosis not present

## 2023-07-12 DIAGNOSIS — Z510 Encounter for antineoplastic radiation therapy: Secondary | ICD-10-CM | POA: Diagnosis not present

## 2023-07-12 LAB — RAD ONC ARIA SESSION SUMMARY
Course Elapsed Days: 1
Plan Fractions Treated to Date: 2
Plan Prescribed Dose Per Fraction: 2 Gy
Plan Total Fractions Prescribed: 35
Plan Total Prescribed Dose: 70 Gy
Reference Point Dosage Given to Date: 4 Gy
Reference Point Session Dosage Given: 2 Gy
Session Number: 2

## 2023-07-13 ENCOUNTER — Telehealth: Payer: Self-pay | Admitting: Oncology

## 2023-07-13 ENCOUNTER — Other Ambulatory Visit: Payer: Self-pay

## 2023-07-13 ENCOUNTER — Ambulatory Visit (INDEPENDENT_AMBULATORY_CARE_PROVIDER_SITE_OTHER): Admitting: Otolaryngology

## 2023-07-13 ENCOUNTER — Ambulatory Visit
Admission: RE | Admit: 2023-07-13 | Discharge: 2023-07-13 | Disposition: A | Discharge status: Home / Self Care | Source: Ambulatory Visit | Attending: Radiation Oncology | Admitting: Radiation Oncology

## 2023-07-13 ENCOUNTER — Encounter (INDEPENDENT_AMBULATORY_CARE_PROVIDER_SITE_OTHER): Payer: Self-pay | Admitting: Otolaryngology

## 2023-07-13 VITALS — BP 117/77 | HR 101

## 2023-07-13 DIAGNOSIS — C3412 Malignant neoplasm of upper lobe, left bronchus or lung: Secondary | ICD-10-CM | POA: Diagnosis not present

## 2023-07-13 DIAGNOSIS — Z5111 Encounter for antineoplastic chemotherapy: Secondary | ICD-10-CM | POA: Diagnosis not present

## 2023-07-13 DIAGNOSIS — C7972 Secondary malignant neoplasm of left adrenal gland: Secondary | ICD-10-CM | POA: Diagnosis not present

## 2023-07-13 DIAGNOSIS — H9191 Unspecified hearing loss, right ear: Secondary | ICD-10-CM | POA: Diagnosis not present

## 2023-07-13 DIAGNOSIS — J3800 Paralysis of vocal cords and larynx, unspecified: Secondary | ICD-10-CM

## 2023-07-13 DIAGNOSIS — R490 Dysphonia: Secondary | ICD-10-CM | POA: Diagnosis not present

## 2023-07-13 DIAGNOSIS — Z87891 Personal history of nicotine dependence: Secondary | ICD-10-CM | POA: Diagnosis not present

## 2023-07-13 DIAGNOSIS — Z510 Encounter for antineoplastic radiation therapy: Secondary | ICD-10-CM | POA: Diagnosis not present

## 2023-07-13 DIAGNOSIS — J383 Other diseases of vocal cords: Secondary | ICD-10-CM

## 2023-07-13 LAB — RAD ONC ARIA SESSION SUMMARY
Course Elapsed Days: 2
Plan Fractions Treated to Date: 3
Plan Prescribed Dose Per Fraction: 2 Gy
Plan Total Fractions Prescribed: 35
Plan Total Prescribed Dose: 70 Gy
Reference Point Dosage Given to Date: 6 Gy
Reference Point Session Dosage Given: 2 Gy
Session Number: 3

## 2023-07-13 NOTE — Progress Notes (Signed)
 ENT Progress Note:   Update 07/13/2023  Discussed the use of AI scribe software for clinical note transcription with the patient, who gave verbal consent to proceed.  History of Present Illness  Update 07/13/2023 He returns for f/u after injection augmentation on 06/29/23 in the office. Denies issues after procedure. He noticed decreased hearing on the right side. 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 Started treatment for lung cancer. Happy with the voice, his voice is much louder now. Minimal residual raspy quality.    Records Reviewed:  Initial Evaluation  Reason for Consult: dysphonia, hx of left lung cancer and vocal fold paralysis    HPI: Discussed the use of AI scribe software for clinical note transcription with the patient, who gave verbal consent to proceed.  History of Present Illness Raymond Hurst is a 73 year old male with vocal cord paralysis who presents for evaluation and management of his condition. He was referred by Dr. Sidney Drain, [facility], for evaluation of left vocal cord paralysis and dysphonia  He has been experiencing dysphonia, diagnosed recently following a hospital admission. Symptoms began approximately three weeks ago with an 'off' voice, initially thought to be laryngitis. Despite antibiotic treatment, his condition worsened, leading to breathlessness when speaking.  He has a history of a lung mass, diagnosed as squamous cell carcinoma, confirmed by bronchoscopy and biopsy. He is awaiting molecular testing results. The cancer has metastasized to the lymph nodes in the chest.  He has a history of smoking, having quit 30 years ago. He also has a history of arrhythmia and is on blood thinners. He possibly had a TIA in the past. No significant issues with swallowing, though occasionally experiences difficulty when drinking water too quickly. A swallow study conducted a couple of weeks ago showed no aspiration (FEES in the hospital).  He uses an  inhaler and hydrocodone  cough syrup for symptom management, particularly at night or when not driving. He also drinks hot teas and honey to alleviate symptoms.  Records Reviewed:  Discharge Summary 06/13/23 - 06/14/23 73 year old male with past medical history of alcohol abuse, lymphedema, permanent atrial fibrillation, A-fib on Eliquis , gout, GI bleed, CVA. Presented with dizziness and palpitation post bronchoscopy Patient apparently had a bronchoscopy for evaluation of lung mass around 9 AM on yesterday 06/12/2023 Later in the evening he progressed to have dizziness, and palpitation.. He apparently was prescribed metoprolol , Lasix  and Eliquis  which she was told to hold for the procedure.  He invertedly took 1 dose of Eliquis  p.m. glucose 151, BNP 130 Hemoglobin 11.9, WBC 7.3, platelets 284.  Sodium 137, potassium 3.3, bicarbonate 24, serum creatinine 0.83.  EKG showed atrial fibrillation with RVR and nonspecific ST changes.  Troponin 6>>7 Lactic acid 1.2.  INR 1.3.  CT of the chest showed stable left hilar mass radiating to the pleural margin.  There is no PE or dissection.  Lung Mass - S/p Bronchoscopy with Biopsy with Pulmonology yesterday - Follow up outpatient with Pulmonology   #Tobacco Use Disorder - Cessation discussed   # Alcohol abuse -no signs of withdraw   #Chronic Lymphedema - TED hose - Hold home Lasix  in setting of Hypotension - restart lasix  after d/c   #morbid Obesity Body mass index is 40.33 kg/m.   Past Medical History:  Diagnosis Date   Adrenal tumor    right adrenal gland tumor favored to be a myelolipoma   Atrial fibrillation (HCC)    Cellulitis of right lower leg  Dysrhythmia    A-fib   History of TIA (transient ischemic attack)    May 2020   Lymphedema    OSA on CPAP    severe obstructive sleep apnea with an AHI of 54/h with no significant central events.  He had nocturnal hypoxemia with O2 sat nadir of 70%. Now on CPAP   Pulmonary mass    Stroke  [facility])     Past Surgical History:  Procedure Laterality Date   BRONCHIAL BIOPSY  06/12/2023   Procedure: BRONCHOSCOPY, WITH BIOPSY;  Surgeon: Denson Flake, MD;  Location: [facility] ENDOSCOPY;  Service: Cardiopulmonary;;   BRONCHIAL BRUSHINGS  06/12/2023   Procedure: BRONCHOSCOPY, WITH BRUSH BIOPSY;  Surgeon: Denson Flake, MD;  Location: [facility] ENDOSCOPY;  Service: Cardiopulmonary;;   BRONCHIAL NEEDLE ASPIRATION BIOPSY  06/12/2023   Procedure: BRONCHOSCOPY, WITH NEEDLE ASPIRATION BIOPSY;  Surgeon: Denson Flake, MD;  Location: [facility] ENDOSCOPY;  Service: Cardiopulmonary;;   HEMOSTASIS CLIP PLACEMENT  06/12/2023   Procedure: CONTROL OF HEMORRHAGE bronch cold saline and epi;  Surgeon: Denson Flake, MD;  Location: [facility] ENDOSCOPY;  Service: Cardiopulmonary;;   INNER EAR SURGERY     KNEE SURGERY Left    THORACENTESIS Left 06/06/2023   Procedure: THORACENTESIS;  Surgeon: Arlina Lair, MD;  Location: [facility] [place] ENDOSCOPY;  Service: Pulmonary;  Laterality: Left;   TONSILLECTOMY     VIDEO BRONCHOSCOPY WITH ENDOBRONCHIAL ULTRASOUND Left 06/12/2023   Procedure: BRONCHOSCOPY, WITH EBUS;  Surgeon: Denson Flake, MD;  Location: [facility] ENDOSCOPY;  Service: Cardiopulmonary;  Laterality: Left;    Family History  Problem Relation Age of Onset   Heart failure Mother    COPD Mother    Atrial fibrillation Mother    Idiopathic pulmonary fibrosis Father     Social History:  reports that he quit smoking about 33 years ago. His smoking use included cigarettes. He started smoking about 53 years ago. He has a 40 pack-year smoking history. He has never used smokeless tobacco. He reports current alcohol use. He reports that he does not use drugs.  Allergies: No Known Allergies  Medications: I have reviewed the patient's current medications.  The PMH, PSH, Medications, Allergies, and SH were reviewed and updated.  ROS: Constitutional: Negative for fever, weight loss and weight gain. Cardiovascular: Negative for chest pain and  dyspnea on exertion. Respiratory: Is not experiencing shortness of breath at rest. Gastrointestinal: Negative for nausea and vomiting. Neurological: Negative for headaches. Psychiatric: The patient is not nervous/anxious  Blood pressure 117/77, pulse (!) 101, SpO2 91%. There is no height or weight on file to calculate BMI.  PHYSICAL EXAM:  Exam: General: Well-developed, well-nourished Communication and Voice: much louder, good projection mild raspy quality  Respiratory Respiratory effort: Equal inspiration and expiration without stridor Cardiovascular Peripheral Vascular: Warm extremities with equal color/perfusion Eyes: No nystagmus with equal extraocular motion bilaterally Neuro/Psych/Balance: Patient oriented to person, place, and time; Appropriate mood and affect; Gait is intact with no imbalance; Cranial nerves I-XII are intact Head and Face Inspection: Normocephalic and atraumatic without mass or lesion Palpation: Facial skeleton intact without bony stepoffs Salivary Glands: No mass or tenderness Facial Strength: Facial motility symmetric and full bilaterally ENT Pinna: External ear intact and fully developed External canal: Canal is patent with intact skin Tympanic Membrane: Clear and mobile External Nose: No scar or anatomic deformity Internal Nose: Septum is S-shaped. No polyp, or purulence. Mucosal edema and erythema present.  Bilateral inferior turbinate hypertrophy.  Lips, Teeth, and gums: Mucosa and teeth intact and viable  TMJ: No pain to palpation with full mobility Oral cavity/oropharynx: No erythema or exudate, no lesions present Nasopharynx: No mass or lesion with intact mucosa Hypopharynx: Intact mucosa without pooling of secretions Larynx Glottic: L VF paralysis on the left, s/p injection augmentation, edges of VF straight with good glottic closure  Supraglottic: Normal appearing epiglottis and AE folds Interarytenoid Space: Moderate  pachydermia&edema Subglottic Space: Patent without lesion or edema Neck Neck and Trachea: Midline trachea without mass or lesion Thyroid : No mass or nodularity Lymphatics: No lymphadenopathy  Procedure: Preoperative diagnosis: dysphonia VF paralysis on the left s/p injection augmentation   Postoperative diagnosis:   Same  Procedure: Flexible fiberoptic laryngoscopy  Surgeon: Artice Last, MD  Anesthesia: Topical lidocaine  and Afrin Complications: None Condition is stable throughout exam  Indications and consent:  The patient presents to the clinic with above symptoms. Indirect laryngoscopy view was incomplete. Thus it was recommended that they undergo a flexible fiberoptic laryngoscopy. All of the risks, benefits, and potential complications were reviewed with the patient preoperatively and verbal informed consent was obtained.  Procedure: The patient was seated upright in the clinic. Topical lidocaine  and Afrin were applied to the nasal cavity. After adequate anesthesia had occurred, I then proceeded to pass the flexible telescope into the nasal cavity. The nasal cavity was patent without rhinorrhea or polyp. The nasopharynx was also patent without mass or lesion. The base of tongue was visualized and was normal. There were no signs of pooling of secretions in the piriform sinuses. The true vocal folds were mobile bilaterally. There were no signs of glottic or supraglottic mucosal lesion or mass. There was moderate interarytenoid pachydermia and post cricoid edema. The telescope was then slowly withdrawn and the patient tolerated the procedure throughout.    Studies Reviewed: CT chest 06/13/23 IMPRESSION: Stable appearing left hilar mass radiating to the pleural margin. The overall appearance is similar to that seen on the prior exam. Associated pleural effusion is noted.  PET CT 06/2023 IMPRESSION: 1. Large (11.1 cm in long axis) left hilar mass primarily in the left upper lobe  extends from the AP window all the way to the left upper lobe pleural margin and abuts the major fissure with associated hypermetabolic activity, compatible with malignancy. 2. Thickening of the genu of the left adrenal gland demonstrates hypermetabolic activity with maximum SUV 6.1 suspicious for early metastatic lesion of the left adrenal gland. The contralateral (right) adrenal gland demonstrates a fatty lesion favoring benign right myelolipoma. 3. Mildly loculated left pleural fluid effusion indeterminate for malignancy. 4. Progressive consolidation or atelectasis in the left upper lobe. Increased ground-glass opacity and volume loss in the left lower lobe. 5. Small left hilar lymph nodes are not pathologically enlarged but have maximum SUV 2.4, below blood pool levels. A subcarinal node adjacent to the esophagus is not pathologically enlarged but has a maximum SUV of 4.2, mildly above blood pool, this could be reactive or neoplastic. 6. Mildly enlarged right inguinal lymph nodes are enlarged but not hypermetabolic. 7. Symmetric soft palate activity with maximum SUV 7.2 on the right and 8.1 on the left, without specific associated CT abnormality. This is most likely physiologic. Similarly there is a small amount of activity along the muscular tissues of the right anterior tongue with maximum SUV 5.6, and no CT correlate, most likely physiologic. 8. Chronic L1 compression fracture. 9.  Aortic Atherosclerosis (ICD10-I70.0).   FEES with speech 06/07/2023 Pt presents with grossly normal swallow function.  There was no penetration or aspiration of  any consistencies trialed, and there was minimal pharyngeal residue.  OF NOTE: Pt's left vocal fold is entirely immobile.  This may be due to RLN palsy from compression from mass.  There appears to be some extra flaccid tissue at apex of L arytenoid which flops into the laryngeal vestibule at times partially obscuring view of vocal cords.  Pt  would benefit from ENT involvement in care. 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  Discussed with pt slow rate, small sips.  He reports some coughing with drinking, but none observed today.  Encourage use of straw as this was the condition for his FEES and no penetration was observed during this study.  Pt would benefit from ENT/SLP OP follow up.         Assessment/Plan: Encounter Diagnoses  Name Primary?   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 Spontaneous resolution unlikely. Vocal cord medialization with injection augmentation proposed to improve phonation and decrease risk of aspiration/improve cough strength. Procedure may require more gel due to paralyzed cord position and vocal cord atrophy. Gel effect lasts 6-12 months, repeat procedures possible. Risks include bruising and potential need for general anesthesia if not able to do under local in the office (he had A-E overhang partially obstructing the view of the glottis. He would like to proceed - Schedule for in office Restylane injection augmentation  - Provided procedure instructions, including transportation and post-procedure care. - Continue anticoagulation, monitor for bruising post-procedure  - Discuss potential need for repeat gel injection if symptoms persist and if only partially medialized after 1st one with residual glottic insufficiency.  Squamous cell carcinoma of lung Squamous cell carcinoma in upper lobe with lymph node involvement, have not initiated treatment. Awaiting molecular testing to finalize treatment plan,  likely radiation and chemotherapy followed by immunotherapy. Surgical resection not planned due to tumor location and critical structure involvement.  Atrial fibrillation Atrial fibrillation managed with anticoagulation to prevent thromboembolic events. - on Eliquis    Update 07/13/2023  Assessment & Plan L VF paralysis and VF atrophy, glottic insufficiency S/p injection augmentation with good outcome based on voice quality and scope today - we discussed that this would last 6-12 months  - will consider injection augmentation vs thyroplasty after repeat exam May 2026  Decreased hearing R side Hx of TM loss, large perforation near deafness on the left side  - reports decreased hearing on the right side (his better side) for a couple of months - normal ear exam on the right side  - hearing evaluation and RTC to discuss results      Artice Last, MD Otolaryngology [facility] Phone: [phone] Fax: [phone]    07/13/2023, 3:18 PM

## 2023-07-13 NOTE — Telephone Encounter (Signed)
 Patient has been scheduled for follow-up visit per 07/12/23 LOS.  LVM notifying pt of appt details, provided my direct number to pt if appt changes need to be made.

## 2023-07-14 ENCOUNTER — Ambulatory Visit
Admission: RE | Admit: 2023-07-14 | Discharge: 2023-07-14 | Disposition: A | Discharge status: Home / Self Care | Source: Ambulatory Visit | Attending: Radiation Oncology

## 2023-07-14 ENCOUNTER — Other Ambulatory Visit: Payer: Self-pay

## 2023-07-14 ENCOUNTER — Ambulatory Visit
Admission: RE | Admit: 2023-07-14 | Discharge: 2023-07-14 | Disposition: A | Discharge status: Home / Self Care | Source: Ambulatory Visit | Attending: Radiation Oncology | Admitting: Radiation Oncology

## 2023-07-14 DIAGNOSIS — Z510 Encounter for antineoplastic radiation therapy: Secondary | ICD-10-CM | POA: Diagnosis not present

## 2023-07-14 DIAGNOSIS — Z5111 Encounter for antineoplastic chemotherapy: Secondary | ICD-10-CM | POA: Diagnosis not present

## 2023-07-14 DIAGNOSIS — Z87891 Personal history of nicotine dependence: Secondary | ICD-10-CM | POA: Diagnosis not present

## 2023-07-14 DIAGNOSIS — C3412 Malignant neoplasm of upper lobe, left bronchus or lung: Secondary | ICD-10-CM | POA: Diagnosis not present

## 2023-07-14 DIAGNOSIS — C7972 Secondary malignant neoplasm of left adrenal gland: Secondary | ICD-10-CM | POA: Diagnosis not present

## 2023-07-14 LAB — RAD ONC ARIA SESSION SUMMARY
Course Elapsed Days: 3
Plan Fractions Treated to Date: 4
Plan Prescribed Dose Per Fraction: 2 Gy
Plan Total Fractions Prescribed: 35
Plan Total Prescribed Dose: 70 Gy
Reference Point Dosage Given to Date: 8 Gy
Reference Point Session Dosage Given: 2 Gy
Session Number: 4

## 2023-07-15 ENCOUNTER — Other Ambulatory Visit: Payer: Self-pay | Admitting: Oncology

## 2023-07-15 DIAGNOSIS — C3412 Malignant neoplasm of upper lobe, left bronchus or lung: Secondary | ICD-10-CM

## 2023-07-17 ENCOUNTER — Ambulatory Visit

## 2023-07-17 ENCOUNTER — Encounter: Payer: Self-pay | Admitting: Nurse Practitioner

## 2023-07-17 ENCOUNTER — Inpatient Hospital Stay

## 2023-07-17 ENCOUNTER — Inpatient Hospital Stay: Admitting: Nurse Practitioner

## 2023-07-17 ENCOUNTER — Other Ambulatory Visit

## 2023-07-17 ENCOUNTER — Ambulatory Visit
Admission: RE | Admit: 2023-07-17 | Discharge: 2023-07-17 | Disposition: A | Discharge status: Home / Self Care | Source: Ambulatory Visit | Attending: Radiation Oncology

## 2023-07-17 ENCOUNTER — Other Ambulatory Visit: Payer: Self-pay

## 2023-07-17 ENCOUNTER — Telehealth: Payer: Self-pay

## 2023-07-17 VITALS — BP 104/66 | HR 100 | Temp 98.0°F | Resp 18 | Ht 72.0 in | Wt 290.4 lb

## 2023-07-17 DIAGNOSIS — Z87891 Personal history of nicotine dependence: Secondary | ICD-10-CM | POA: Diagnosis not present

## 2023-07-17 DIAGNOSIS — C3412 Malignant neoplasm of upper lobe, left bronchus or lung: Secondary | ICD-10-CM

## 2023-07-17 DIAGNOSIS — C7970 Secondary malignant neoplasm of unspecified adrenal gland: Secondary | ICD-10-CM

## 2023-07-17 DIAGNOSIS — Z5111 Encounter for antineoplastic chemotherapy: Secondary | ICD-10-CM | POA: Diagnosis not present

## 2023-07-17 DIAGNOSIS — Z510 Encounter for antineoplastic radiation therapy: Secondary | ICD-10-CM | POA: Diagnosis not present

## 2023-07-17 DIAGNOSIS — C7972 Secondary malignant neoplasm of left adrenal gland: Secondary | ICD-10-CM | POA: Diagnosis not present

## 2023-07-17 LAB — RAD ONC ARIA SESSION SUMMARY
Course Elapsed Days: 6
Plan Fractions Treated to Date: 5
Plan Prescribed Dose Per Fraction: 2 Gy
Plan Total Fractions Prescribed: 35
Plan Total Prescribed Dose: 70 Gy
Reference Point Dosage Given to Date: 10 Gy
Reference Point Session Dosage Given: 2 Gy
Session Number: 5

## 2023-07-17 LAB — CBC WITH DIFFERENTIAL (CANCER CENTER ONLY)
Abs Immature Granulocytes: 0.05 10*3/uL (ref 0.00–0.07)
Basophils Absolute: 0 10*3/uL (ref 0.0–0.1)
Basophils Relative: 0 %
Eosinophils Absolute: 0.2 10*3/uL (ref 0.0–0.5)
Eosinophils Relative: 3 %
HCT: 35.2 % — ABNORMAL LOW (ref 39.0–52.0)
Hemoglobin: 11.4 g/dL — ABNORMAL LOW (ref 13.0–17.0)
Immature Granulocytes: 1 %
Lymphocytes Relative: 9 %
Lymphs Abs: 0.5 10*3/uL — ABNORMAL LOW (ref 0.7–4.0)
MCH: 26.8 pg (ref 26.0–34.0)
MCHC: 32.4 g/dL (ref 30.0–36.0)
MCV: 82.6 fL (ref 80.0–100.0)
Monocytes Absolute: 0.4 10*3/uL (ref 0.1–1.0)
Monocytes Relative: 6 %
Neutro Abs: 4.6 10*3/uL (ref 1.7–7.7)
Neutrophils Relative %: 81 %
Platelet Count: 212 10*3/uL (ref 150–400)
RBC: 4.26 MIL/uL (ref 4.22–5.81)
RDW: 16.9 % — ABNORMAL HIGH (ref 11.5–15.5)
WBC Count: 5.7 10*3/uL (ref 4.0–10.5)
nRBC: 0 % (ref 0.0–0.2)

## 2023-07-17 LAB — CMP (CANCER CENTER ONLY)
ALT: 13 U/L (ref 0–44)
AST: 15 U/L (ref 15–41)
Albumin: 3.2 g/dL — ABNORMAL LOW (ref 3.5–5.0)
Alkaline Phosphatase: 63 U/L (ref 38–126)
Anion gap: 12 (ref 5–15)
BUN: 12 mg/dL (ref 8–23)
CO2: 24 mmol/L (ref 22–32)
Calcium: 9.3 mg/dL (ref 8.9–10.3)
Chloride: 100 mmol/L (ref 98–111)
Creatinine: 0.75 mg/dL (ref 0.61–1.24)
GFR, Estimated: >60 mL/min (ref >60)
Glucose, Bld: 119 mg/dL — ABNORMAL HIGH (ref 70–99)
Potassium: 4.1 mmol/L (ref 3.5–5.1)
Sodium: 136 mmol/L (ref 135–145)
Total Bilirubin: 0.4 mg/dL (ref 0.0–1.2)
Total Protein: 6.4 g/dL — ABNORMAL LOW (ref 6.5–8.1)

## 2023-07-17 MED ORDER — SODIUM CHLORIDE 0.9 % IV SOLN: INTRAVENOUS | Status: DC

## 2023-07-17 MED ORDER — DEXAMETHASONE SODIUM PHOSPHATE 10 MG/ML IJ SOLN
10.0000 mg | Freq: Once | INTRAMUSCULAR | Status: AC
  Filled 2023-07-17: qty 1

## 2023-07-17 MED ORDER — FAMOTIDINE IN NACL 20-0.9 MG/50ML-% IV SOLN
20.0000 mg | Freq: Once | INTRAVENOUS | Status: AC
  Filled 2023-07-17: qty 50

## 2023-07-17 MED ORDER — SODIUM CHLORIDE 0.9 % IV SOLN
50.0000 mg/m2 | Freq: Once | INTRAVENOUS | Status: AC
  Filled 2023-07-17: qty 22

## 2023-07-17 MED ORDER — SODIUM CHLORIDE 0.9 % IV SOLN
250.0000 mg | Freq: Once | INTRAVENOUS | Status: AC
  Filled 2023-07-17: qty 25

## 2023-07-17 MED ORDER — PALONOSETRON HCL INJECTION 0.25 MG/5ML
0.2500 mg | Freq: Once | INTRAVENOUS | Status: AC
  Filled 2023-07-17: qty 5

## 2023-07-17 MED ORDER — DIPHENHYDRAMINE HCL 50 MG/ML IJ SOLN
25.0000 mg | Freq: Once | INTRAMUSCULAR | Status: AC
  Filled 2023-07-17: qty 1

## 2023-07-17 NOTE — Telephone Encounter (Signed)
 [facility] Clinical Social Work  Clinical Social Work was referred by nurse for assessment of psychosocial needs.  Clinical Social Worker contacted patient by phone to offer support and assess for needs.  Patient preferred for CSW to speak with Spouse. CSW attempted to reach spouse. CSW was unable to reach patient, left vm with direct contact.    Maudie Sorrow, LCSW  Clinical Social Worker [facility]

## 2023-07-17 NOTE — Progress Notes (Signed)
 [facility] OFFICE PROGRESS NOTE   Diagnosis: Non-small cell lung cancer  INTERVAL HISTORY:   Raymond Hurst returns as scheduled.  He completed cycle 1 weekly Taxol /carboplatin  07/10/2023.  He continues radiation.  He denies nausea/vomiting.  No mouth sores.  No diarrhea.  No numbness or tingling in the hands or feet.  He denies signs of allergic reaction.  Stable dyspnea on exertion.  Several days after chemotherapy he became constipated.  He strained on the commode for a long time and then manually disimpacted himself.  He noted some blood on his fingertips following the disimpaction.  No further bleeding.  Has had several bowel movements since then.  He notes a rectal lump.  Objective:  Vital signs in last 24 hours:  Blood pressure 104/66, pulse 100, temperature 98 F (36.7 C), temperature source Temporal, resp. rate 18, height 6' (1.829 m), weight 290 lb 6.4 oz (131.7 kg), SpO2 98%.    HEENT: No thrush or ulcers. Resp: Distant breath sounds.  No respiratory distress. Cardio: Irregular. GI: No hepatosplenomegaly.  Large soft external hemorrhoid. Vascular: Firm edema both legs.   Lab Results:  Lab Results  Component Value Date   WBC 5.7 07/17/2023   HGB 11.4 (L) 07/17/2023   HCT 35.2 (L) 07/17/2023   MCV 82.6 07/17/2023   PLT 212 07/17/2023   NEUTROABS 4.6 07/17/2023    Imaging:  No results found.  Medications: I have reviewed the patient's current medications.  Assessment/Plan: Left lung mass CT chest 06/05/2023: Left upper lobe mass with invasion of the mediastinum and effacement of the left upper lobe and lower lobe pulmonary arteries, moderate left pleural effusion, small mediastinal lymph nodes, right adrenal mass favoring myolipoma, probable benign lucent lesion at the right scapula Diagnostic left thoracentesis 06/06/2023: Cytology-no malignant cells identified Bronchoscopy with endobronchial ultrasound and biopsies 06/12/2023-lateral left mainstem  bronchial irregularity extending into the left upper lobe airways narrowing the left upper lobe airways, no discrete endobronchial lesion.  Endobronchial ultrasound showed a left hilar mass with significant vascularity; enlargement at station 4L, 11L, 7.  Station 7, 4L and 11L lymph nodes negative for malignancy; left hilar mass/lymph node metastatic carcinoma; left upper lobe endobronchial brushing malignant cells present; left upper lobe endobronchial biopsy suspicious for malignancy; weak focal p63 positivity in the tumor cells, negative for TTF-1, synaptophysin and CD56, a poorly differentiated squamous cell carcinoma is favored; PD-L1 pending; foundation 1-HRD signature/microsatellite status/tumor mutation burden cannot be determined, IDH1 [id number]. PET scan 06/20/2023-large left hilar mass primarily in the left upper lobe.  Left adrenal gland with hypermetabolic activity suspicious for early metastatic lesion. Brain MRI 06/30/2023-negative for metastatic disease Week 1 Taxol /carboplatin  07/10/2023 Radiation chest 07/11/2023; plan for simultaneous treatment left adrenal metastasis with 5 fractions of SBRT Week 2 Taxol /carboplatin  07/17/2023   2.  Hoarseness/cough secondary to #1-improved following vocal cord procedure 06/29/2023 3.  Atrial fibrillation 4.  Lower extremity edema 5.  Alcohol use 6.  Large external hemorrhoid on exam 07/17/2023  Disposition: Raymond Hurst appears stable.  He has completed 1 cycle of weekly Taxol /carboplatin .  Tolerated well.  Plan to proceed with week 2 today as scheduled.  He continues radiation.  CBC and chemistry panel reviewed.  Labs adequate for treatment.  He has a large soft external hemorrhoid likely due to recent significant constipation with straining, manual disimpaction.  He will begin a stool softener and laxative, try to avoid straining.  He will utilize over-the-counter Preparation H.  He will return for follow-up and treatment in  1 week.  We are available to  see him sooner if needed.    Diana Forster ANP/GNP-BC   07/17/2023  9:00 AM

## 2023-07-17 NOTE — Patient Instructions (Signed)
 [facility] - A DEPT OF [facility]  Discharge Instructions: Thank you for choosing [facility] to provide your oncology and hematology care.   If you have a lab appointment with the Cancer Center, please go directly to the Cancer Center and check in at the registration area.   Wear comfortable clothing and clothing appropriate for easy access to any Portacath or PICC line.   We strive to give you quality time with your provider. You may need to reschedule your appointment if you arrive late (15 or more minutes).  Arriving late affects you and other patients whose appointments are after yours.  Also, if you miss three or more appointments without notifying the office, you may be dismissed from the clinic at the provider's discretion.      For prescription refill requests, have your pharmacy contact our office and allow 72 hours for refills to be completed.    Today you received the following chemotherapy and/or immunotherapy agents :  Taxol ,  Carboplatin .   To help prevent nausea and vomiting after your treatment, we encourage you to take your nausea medication as directed.  BELOW ARE SYMPTOMS THAT SHOULD BE REPORTED IMMEDIATELY: *FEVER GREATER THAN 100.4 F (38 C) OR HIGHER *CHILLS OR SWEATING *NAUSEA AND VOMITING THAT IS NOT CONTROLLED WITH YOUR NAUSEA MEDICATION *UNUSUAL SHORTNESS OF BREATH *UNUSUAL BRUISING OR BLEEDING *URINARY PROBLEMS (pain or burning when urinating, or frequent urination) *BOWEL PROBLEMS (unusual diarrhea, constipation, pain near the anus) TENDERNESS IN MOUTH AND THROAT WITH OR WITHOUT PRESENCE OF ULCERS (sore throat, sores in mouth, or a toothache) UNUSUAL RASH, SWELLING OR PAIN  UNUSUAL VAGINAL DISCHARGE OR ITCHING   Items with * indicate a potential emergency and should be followed up as soon as possible or go to the Emergency Department if any problems should occur.  Please show the CHEMOTHERAPY ALERT CARD or  IMMUNOTHERAPY ALERT CARD at check-in to the Emergency Department and triage nurse.  Should you have questions after your visit or need to cancel or reschedule your appointment, please contact [facility] - A DEPT OF [facility]  Dept: [phone]  and follow the prompts.  Office hours are 8:00 a.m. to 4:30 p.m. Monday - Friday. Please note that voicemails left after 4:00 p.m. may not be returned until the following business day.  We are closed weekends and major holidays. You have access to a nurse at all times for urgent questions. Please call the main number to the clinic Dept: [phone] and follow the prompts.   For any non-urgent questions, you may also contact your provider using MyChart. We now offer e-Visits for anyone 46 and older to request care online for non-urgent symptoms. For details visit [email or web address].   Also download the MyChart app! Go to the app store, search MyChart, open the app, select [place], and log in with your MyChart username and password.

## 2023-07-17 NOTE — Progress Notes (Signed)
 Patient seen by Lonna Cobb NP today  Vitals are within treatment parameters:Yes   Labs are within treatment parameters: Yes   Treatment plan has been signed: Yes   Per physician team, Patient is ready for treatment and there are NO modifications to the treatment plan.

## 2023-07-18 ENCOUNTER — Inpatient Hospital Stay

## 2023-07-18 ENCOUNTER — Telehealth: Payer: Self-pay

## 2023-07-18 ENCOUNTER — Other Ambulatory Visit: Payer: Self-pay

## 2023-07-18 ENCOUNTER — Ambulatory Visit
Admission: RE | Admit: 2023-07-18 | Discharge: 2023-07-18 | Disposition: A | Discharge status: Home / Self Care | Source: Ambulatory Visit | Attending: Radiation Oncology | Admitting: Radiation Oncology

## 2023-07-18 DIAGNOSIS — C3412 Malignant neoplasm of upper lobe, left bronchus or lung: Secondary | ICD-10-CM | POA: Diagnosis not present

## 2023-07-18 DIAGNOSIS — Z87891 Personal history of nicotine dependence: Secondary | ICD-10-CM | POA: Diagnosis not present

## 2023-07-18 DIAGNOSIS — Z5111 Encounter for antineoplastic chemotherapy: Secondary | ICD-10-CM | POA: Diagnosis not present

## 2023-07-18 DIAGNOSIS — Z510 Encounter for antineoplastic radiation therapy: Secondary | ICD-10-CM | POA: Diagnosis not present

## 2023-07-18 DIAGNOSIS — C7972 Secondary malignant neoplasm of left adrenal gland: Secondary | ICD-10-CM | POA: Diagnosis not present

## 2023-07-18 LAB — RAD ONC ARIA SESSION SUMMARY
Course Elapsed Days: 7
Plan Fractions Treated to Date: 6
Plan Prescribed Dose Per Fraction: 2 Gy
Plan Total Fractions Prescribed: 35
Plan Total Prescribed Dose: 70 Gy
Reference Point Dosage Given to Date: 12 Gy
Reference Point Session Dosage Given: 2 Gy
Session Number: 6

## 2023-07-18 NOTE — Telephone Encounter (Signed)
 [facility] CSW Progress Note  Clinical Social Worker attempted to reach patient's spouse via telephone x2 on this date. CSW was unable to reach patient, left vm with direct contact on first attempt. 2nd attempt voicemail box was full.  Maudie Sorrow, LCSW Clinical Social Worker [facility]

## 2023-07-18 NOTE — Progress Notes (Signed)
 [facility] Clinical Social Work  Clinical Social Work was referred by Statistician for assessment of psychosocial needs.  Clinical Social Worker contacted spouse by phone to offer support and assess for needs.  Patient's spouse reported need for financial assistance for utility bill set for disconnection on 6/23. CSW assessed patient's eligibility for resources. Patient does not meet criteria for [employer] due to no presumptive eligibility.  CSW placed referral for [employer] on this date.   CSW cannot guarrante assistance is available and is uncertain that assistance can be received by 6/23. CSW encouraged patient and spouse to set up payment arrangement. Patient's spouse verbalized understanding.   Maudie Sorrow, LCSW  Clinical Social Worker [facility]

## 2023-07-19 ENCOUNTER — Ambulatory Visit
Admission: RE | Admit: 2023-07-19 | Discharge: 2023-07-19 | Disposition: A | Discharge status: Home / Self Care | Source: Ambulatory Visit | Attending: Radiation Oncology | Admitting: Radiation Oncology

## 2023-07-19 ENCOUNTER — Other Ambulatory Visit: Payer: Self-pay

## 2023-07-19 DIAGNOSIS — Z510 Encounter for antineoplastic radiation therapy: Secondary | ICD-10-CM | POA: Diagnosis not present

## 2023-07-19 DIAGNOSIS — C801 Malignant (primary) neoplasm, unspecified: Secondary | ICD-10-CM

## 2023-07-19 DIAGNOSIS — Z5111 Encounter for antineoplastic chemotherapy: Secondary | ICD-10-CM | POA: Diagnosis not present

## 2023-07-19 DIAGNOSIS — C3412 Malignant neoplasm of upper lobe, left bronchus or lung: Secondary | ICD-10-CM

## 2023-07-19 DIAGNOSIS — Z87891 Personal history of nicotine dependence: Secondary | ICD-10-CM | POA: Diagnosis not present

## 2023-07-19 DIAGNOSIS — C7972 Secondary malignant neoplasm of left adrenal gland: Secondary | ICD-10-CM | POA: Diagnosis not present

## 2023-07-19 DIAGNOSIS — C7492 Malignant neoplasm of unspecified part of left adrenal gland: Secondary | ICD-10-CM | POA: Diagnosis not present

## 2023-07-19 LAB — RAD ONC ARIA SESSION SUMMARY
Course Elapsed Days: 8
Plan Fractions Treated to Date: 7
Plan Prescribed Dose Per Fraction: 2 Gy
Plan Total Fractions Prescribed: 35
Plan Total Prescribed Dose: 70 Gy
Reference Point Dosage Given to Date: 14 Gy
Reference Point Session Dosage Given: 2 Gy
Session Number: 7

## 2023-07-20 ENCOUNTER — Encounter: Payer: Self-pay | Admitting: Oncology

## 2023-07-20 ENCOUNTER — Other Ambulatory Visit: Payer: Self-pay

## 2023-07-20 ENCOUNTER — Ambulatory Visit
Admission: RE | Admit: 2023-07-20 | Discharge: 2023-07-20 | Disposition: A | Discharge status: Home / Self Care | Source: Ambulatory Visit | Attending: Radiation Oncology | Admitting: Radiation Oncology

## 2023-07-20 DIAGNOSIS — Z510 Encounter for antineoplastic radiation therapy: Secondary | ICD-10-CM | POA: Diagnosis not present

## 2023-07-20 DIAGNOSIS — C3412 Malignant neoplasm of upper lobe, left bronchus or lung: Secondary | ICD-10-CM | POA: Diagnosis not present

## 2023-07-20 DIAGNOSIS — Z5111 Encounter for antineoplastic chemotherapy: Secondary | ICD-10-CM | POA: Diagnosis not present

## 2023-07-20 DIAGNOSIS — C7972 Secondary malignant neoplasm of left adrenal gland: Secondary | ICD-10-CM | POA: Diagnosis not present

## 2023-07-20 DIAGNOSIS — C7492 Malignant neoplasm of unspecified part of left adrenal gland: Secondary | ICD-10-CM | POA: Diagnosis not present

## 2023-07-20 LAB — RAD ONC ARIA SESSION SUMMARY
Course Elapsed Days: 9
Plan Fractions Treated to Date: 8
Plan Prescribed Dose Per Fraction: 2 Gy
Plan Total Fractions Prescribed: 35
Plan Total Prescribed Dose: 70 Gy
Reference Point Dosage Given to Date: 16 Gy
Reference Point Session Dosage Given: 2 Gy
Session Number: 8

## 2023-07-20 NOTE — Progress Notes (Signed)
  Radiation Oncology         [phone] ________________________________  Name: DUEL CONRAD MRN: [id number]  Date: 07/19/2023  DOB: [birth year]  STEREOTACTIC BODY RADIOTHERAPY SIMULATION AND TREATMENT PLANNING NOTE    ICD-10-CM   1. Metastasis to left adrenal gland of unknown origin (HCC)  C79.72    C80.1     2. 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  Identity was confirmed.  All relevant records and images related to the planned course of therapy were reviewed.  The patient freely provided informed written consent to proceed with treatment after reviewing the details related to the planned course of therapy. The consent form was witnessed and verified by the simulation staff.  Then, the patient was set-up in a stable reproducible  supine position for radiation therapy.  A BodyFix immobilization pillow was fabricated for reproducible positioning.  Surface markings were placed.  The CT images were loaded into the planning software.  The gross target volumes (GTV) and planning target volumes (PTV) were delinieated, and avoidance structures were contoured.  Treatment planning then occurred.  The radiation prescription was entered and confirmed.  A total of two complex treatment devices were fabricated in the form of the BodyFix immobilization pillow and a neck accuform cushion.  I have requested : 3D Simulation  I have requested a DVH of the following structures: targets and all normal structures near the target including kidneys, liver, spinal cord, skin and others as noted on the radiation plan to maintain doses in adherence with established limits  SPECIAL TREATMENT PROCEDURE:  The planned course of therapy using radiation constitutes a special treatment procedure.  Special care is required in the management of this patient for the following reasons. High dose per fraction requiring special monitoring for increased toxicities of treatment including daily imaging..  The special nature of the planned course of radiotherapy will require increased physician supervision and oversight to ensure patient's safety with optimal treatment outcomes.    This requires extended time and effort.    PLAN:  The patient will receive 50 Gy in 5 fractions.  ________________________________  Trilby Fujisawa Lorri Rota, M.D.

## 2023-07-21 ENCOUNTER — Ambulatory Visit
Admission: RE | Admit: 2023-07-21 | Discharge: 2023-07-21 | Disposition: A | Discharge status: Home / Self Care | Source: Ambulatory Visit | Attending: Radiation Oncology | Admitting: Radiation Oncology

## 2023-07-21 ENCOUNTER — Other Ambulatory Visit: Payer: Self-pay

## 2023-07-21 DIAGNOSIS — Z5111 Encounter for antineoplastic chemotherapy: Secondary | ICD-10-CM | POA: Diagnosis not present

## 2023-07-21 DIAGNOSIS — C3412 Malignant neoplasm of upper lobe, left bronchus or lung: Secondary | ICD-10-CM | POA: Diagnosis not present

## 2023-07-21 DIAGNOSIS — Z87891 Personal history of nicotine dependence: Secondary | ICD-10-CM | POA: Diagnosis not present

## 2023-07-21 DIAGNOSIS — C7972 Secondary malignant neoplasm of left adrenal gland: Secondary | ICD-10-CM | POA: Diagnosis not present

## 2023-07-21 DIAGNOSIS — Z510 Encounter for antineoplastic radiation therapy: Secondary | ICD-10-CM | POA: Diagnosis not present

## 2023-07-21 LAB — RAD ONC ARIA SESSION SUMMARY
Course Elapsed Days: 10
Plan Fractions Treated to Date: 9
Plan Prescribed Dose Per Fraction: 2 Gy
Plan Total Fractions Prescribed: 35
Plan Total Prescribed Dose: 70 Gy
Reference Point Dosage Given to Date: 18 Gy
Reference Point Session Dosage Given: 2 Gy
Session Number: 9

## 2023-07-23 ENCOUNTER — Other Ambulatory Visit: Payer: Self-pay | Admitting: Oncology

## 2023-07-24 ENCOUNTER — Other Ambulatory Visit: Payer: Self-pay

## 2023-07-24 ENCOUNTER — Encounter: Payer: Self-pay | Admitting: Nurse Practitioner

## 2023-07-24 ENCOUNTER — Ambulatory Visit
Admission: RE | Admit: 2023-07-24 | Discharge: 2023-07-24 | Disposition: A | Discharge status: Home / Self Care | Source: Ambulatory Visit | Attending: Radiation Oncology | Admitting: Radiation Oncology

## 2023-07-24 ENCOUNTER — Inpatient Hospital Stay (HOSPITAL_BASED_OUTPATIENT_CLINIC_OR_DEPARTMENT_OTHER): Admitting: Nurse Practitioner

## 2023-07-24 ENCOUNTER — Inpatient Hospital Stay

## 2023-07-24 VITALS — BP 160/100 | HR 98 | Temp 98.2°F | Resp 16

## 2023-07-24 VITALS — BP 108/63 | HR 98 | Temp 98.0°F | Resp 18 | Ht 72.0 in | Wt 284.6 lb

## 2023-07-24 DIAGNOSIS — Z5111 Encounter for antineoplastic chemotherapy: Secondary | ICD-10-CM | POA: Diagnosis not present

## 2023-07-24 DIAGNOSIS — C7970 Secondary malignant neoplasm of unspecified adrenal gland: Secondary | ICD-10-CM | POA: Diagnosis not present

## 2023-07-24 DIAGNOSIS — R0609 Other forms of dyspnea: Secondary | ICD-10-CM

## 2023-07-24 DIAGNOSIS — C3412 Malignant neoplasm of upper lobe, left bronchus or lung: Secondary | ICD-10-CM

## 2023-07-24 DIAGNOSIS — Z87891 Personal history of nicotine dependence: Secondary | ICD-10-CM | POA: Diagnosis not present

## 2023-07-24 DIAGNOSIS — I4891 Unspecified atrial fibrillation: Secondary | ICD-10-CM

## 2023-07-24 DIAGNOSIS — Z510 Encounter for antineoplastic radiation therapy: Secondary | ICD-10-CM | POA: Diagnosis not present

## 2023-07-24 DIAGNOSIS — C7972 Secondary malignant neoplasm of left adrenal gland: Secondary | ICD-10-CM | POA: Diagnosis not present

## 2023-07-24 LAB — CBC WITH DIFFERENTIAL (CANCER CENTER ONLY)
Abs Immature Granulocytes: 0.02 10*3/uL (ref 0.00–0.07)
Basophils Absolute: 0 10*3/uL (ref 0.0–0.1)
Basophils Relative: 1 %
Eosinophils Absolute: 0.1 10*3/uL (ref 0.0–0.5)
Eosinophils Relative: 3 %
HCT: 33.9 % — ABNORMAL LOW (ref 39.0–52.0)
Hemoglobin: 11.2 g/dL — ABNORMAL LOW (ref 13.0–17.0)
Immature Granulocytes: 1 %
Lymphocytes Relative: 11 %
Lymphs Abs: 0.3 10*3/uL — ABNORMAL LOW (ref 0.7–4.0)
MCH: 27.4 pg (ref 26.0–34.0)
MCHC: 33 g/dL (ref 30.0–36.0)
MCV: 82.9 fL (ref 80.0–100.0)
Monocytes Absolute: 0.3 10*3/uL (ref 0.1–1.0)
Monocytes Relative: 9 %
Neutro Abs: 2.3 10*3/uL (ref 1.7–7.7)
Neutrophils Relative %: 75 %
Platelet Count: 160 10*3/uL (ref 150–400)
RBC: 4.09 MIL/uL — ABNORMAL LOW (ref 4.22–5.81)
RDW: 17.1 % — ABNORMAL HIGH (ref 11.5–15.5)
WBC Count: 3 10*3/uL — ABNORMAL LOW (ref 4.0–10.5)
nRBC: 0 % (ref 0.0–0.2)

## 2023-07-24 LAB — RAD ONC ARIA SESSION SUMMARY
Course Elapsed Days: 13
Plan Fractions Treated to Date: 10
Plan Prescribed Dose Per Fraction: 2 Gy
Plan Total Fractions Prescribed: 35
Plan Total Prescribed Dose: 70 Gy
Reference Point Dosage Given to Date: 20 Gy
Reference Point Session Dosage Given: 2 Gy
Session Number: 10

## 2023-07-24 LAB — CMP (CANCER CENTER ONLY)
ALT: 10 U/L (ref 0–44)
AST: 12 U/L — ABNORMAL LOW (ref 15–41)
Albumin: 3.3 g/dL — ABNORMAL LOW (ref 3.5–5.0)
Alkaline Phosphatase: 61 U/L (ref 38–126)
Anion gap: 13 (ref 5–15)
BUN: 9 mg/dL (ref 8–23)
CO2: 23 mmol/L (ref 22–32)
Calcium: 9.1 mg/dL (ref 8.9–10.3)
Chloride: 100 mmol/L (ref 98–111)
Creatinine: 0.71 mg/dL (ref 0.61–1.24)
GFR, Estimated: >60 mL/min (ref >60)
Glucose, Bld: 178 mg/dL — ABNORMAL HIGH (ref 70–99)
Potassium: 4.1 mmol/L (ref 3.5–5.1)
Sodium: 136 mmol/L (ref 135–145)
Total Bilirubin: 0.5 mg/dL (ref 0.0–1.2)
Total Protein: 6.3 g/dL — ABNORMAL LOW (ref 6.5–8.1)

## 2023-07-24 MED ORDER — SODIUM CHLORIDE 0.9 % IV SOLN
250.0000 mg | Freq: Once | INTRAVENOUS | Status: AC
  Administered 2023-07-24: 250 mg via INTRAVENOUS
  Filled 2023-07-24: qty 25

## 2023-07-24 MED ORDER — SODIUM CHLORIDE 0.9 % IV SOLN
50.0000 mg/m2 | Freq: Once | INTRAVENOUS | Status: AC
  Administered 2023-07-24: 132 mg via INTRAVENOUS
  Filled 2023-07-24: qty 22

## 2023-07-24 MED ORDER — FAMOTIDINE IN NACL 20-0.9 MG/50ML-% IV SOLN
20.0000 mg | Freq: Once | INTRAVENOUS | Status: AC
  Administered 2023-07-24: 20 mg via INTRAVENOUS
  Filled 2023-07-24: qty 50

## 2023-07-24 MED ORDER — DEXAMETHASONE SODIUM PHOSPHATE 10 MG/ML IJ SOLN
10.0000 mg | Freq: Once | INTRAMUSCULAR | Status: AC
  Administered 2023-07-24: 10 mg via INTRAVENOUS
  Filled 2023-07-24: qty 1

## 2023-07-24 MED ORDER — SODIUM CHLORIDE 0.9 % IV SOLN
INTRAVENOUS | Status: DC
Start: 2023-07-24 — End: 2023-07-24

## 2023-07-24 MED ORDER — DIPHENHYDRAMINE HCL 50 MG/ML IJ SOLN
25.0000 mg | Freq: Once | INTRAMUSCULAR | Status: AC
  Administered 2023-07-24: 25 mg via INTRAVENOUS
  Filled 2023-07-24: qty 1

## 2023-07-24 MED ORDER — PALONOSETRON HCL INJECTION 0.25 MG/5ML
0.2500 mg | Freq: Once | INTRAVENOUS | Status: AC
  Administered 2023-07-24: 0.25 mg via INTRAVENOUS
  Filled 2023-07-24: qty 5

## 2023-07-24 NOTE — Patient Instructions (Signed)
 [facility] - A DEPT OF [facility]  Discharge Instructions: Thank you for choosing [facility] to provide your oncology and hematology care.   If you have a lab appointment with the Cancer Center, please go directly to the Cancer Center and check in at the registration area.   Wear comfortable clothing and clothing appropriate for easy access to any Portacath or PICC line.   We strive to give you quality time with your provider. You may need to reschedule your appointment if you arrive late (15 or more minutes).  Arriving late affects you and other patients whose appointments are after yours.  Also, if you miss three or more appointments without notifying the office, you may be dismissed from the clinic at the provider's discretion.      For prescription refill requests, have your pharmacy contact our office and allow 72 hours for refills to be completed.    Today you received the following chemotherapy and/or immunotherapy agents: paclitaxel and carboplatin      To help prevent nausea and vomiting after your treatment, we encourage you to take your nausea medication as directed.  BELOW ARE SYMPTOMS THAT SHOULD BE REPORTED IMMEDIATELY: *FEVER GREATER THAN 100.4 F (38 C) OR HIGHER *CHILLS OR SWEATING *NAUSEA AND VOMITING THAT IS NOT CONTROLLED WITH YOUR NAUSEA MEDICATION *UNUSUAL SHORTNESS OF BREATH *UNUSUAL BRUISING OR BLEEDING *URINARY PROBLEMS (pain or burning when urinating, or frequent urination) *BOWEL PROBLEMS (unusual diarrhea, constipation, pain near the anus) TENDERNESS IN MOUTH AND THROAT WITH OR WITHOUT PRESENCE OF ULCERS (sore throat, sores in mouth, or a toothache) UNUSUAL RASH, SWELLING OR PAIN  UNUSUAL VAGINAL DISCHARGE OR ITCHING   Items with * indicate a potential emergency and should be followed up as soon as possible or go to the Emergency Department if any problems should occur.  Please show the CHEMOTHERAPY ALERT CARD  or IMMUNOTHERAPY ALERT CARD at check-in to the Emergency Department and triage nurse.  Should you have questions after your visit or need to cancel or reschedule your appointment, please contact [facility] - A DE[facility]  Dept: [phone]  and follow the prompts.  Office hours are 8:00 a.m. to 4:30 p.m. Monday - Friday. Please note that voicemails left after 4:00 p.m. may not be returned until the following business day.  We are closed weekends and major holidays. You have access to a nurse at all times for urgent questions. Please call the main number to the clinic Dept: [phone] and follow the prompts.   For any non-urgent questions, you may also contact your provider using MyChart. We now offer e-Visits for anyone 57 and older to request care online for non-urgent symptoms. For details visit [email or web address].   Also download the MyChart app! Go to the app store, search "MyChart", open the app, select [facility], and log in with your MyChart username and password.

## 2023-07-24 NOTE — Progress Notes (Signed)
 Patient seen by Lonna Cobb NP today  Vitals are within treatment parameters:Yes   Labs are within treatment parameters: Yes   Treatment plan has been signed: Yes   Per physician team, Patient is ready for treatment and there are NO modifications to the treatment plan.

## 2023-07-24 NOTE — Progress Notes (Signed)
  [facility] OFFICE PROGRESS NOTE   Diagnosis: Non-small cell lung cancer  INTERVAL HISTORY:   Raymond Hurst returns as scheduled.  He continues radiation.  He completed cycle 2 weekly Taxol /carboplatin  07/17/2023.  He denies nausea/vomiting.  No mouth sores.  No diarrhea.  He has intermittent constipation.  He is taking a stool softener as needed.  He notes the hemorrhoid is shrinking.  No numbness or tingling in the hands or feet.  No dysphagia.  Objective:  Vital signs in last 24 hours:  Blood pressure 108/63, pulse 98, temperature 98 F (36.7 C), resp. rate 18, height 6' (1.829 m), weight 284 lb 9.6 oz (129.1 kg), SpO2 100%.    HEENT: No thrush or ulcers. Resp: Distant breath sounds.  No respiratory distress. Cardio: Irregular. GI: Abdomen soft and nontender.  No hepatosplenomegaly. Vascular: Firm edema both legs.   Lab Results:  Lab Results  Component Value Date   WBC 3.0 (L) 07/24/2023   HGB 11.2 (L) 07/24/2023   HCT 33.9 (L) 07/24/2023   MCV 82.9 07/24/2023   PLT 160 07/24/2023   NEUTROABS 2.3 07/24/2023    Imaging:  No results found.  Medications: I have reviewed the patient's current medications.  Assessment/Plan: Left lung mass CT chest 06/05/2023: Left upper lobe mass with invasion of the mediastinum and effacement of the left upper lobe and lower lobe pulmonary arteries, moderate left pleural effusion, small mediastinal lymph nodes, right adrenal mass favoring myolipoma, probable benign lucent lesion at the right scapula Diagnostic left thoracentesis 06/06/2023: Cytology-no malignant cells identified Bronchoscopy with endobronchial ultrasound and biopsies 06/12/2023-lateral left mainstem bronchial irregularity extending into the left upper lobe airways narrowing the left upper lobe airways, no discrete endobronchial lesion.  Endobronchial ultrasound showed a left hilar mass with significant vascularity; enlargement at station 4L, 11L, 7.  Station 7,  4L and 11L lymph nodes negative for malignancy; left hilar mass/lymph node metastatic carcinoma; left upper lobe endobronchial brushing malignant cells present; left upper lobe endobronchial biopsy suspicious for malignancy; weak focal p63 positivity in the tumor cells, negative for TTF-1, synaptophysin and CD56, a poorly differentiated squamous cell carcinoma is favored; PD-L1 pending; foundation 1-HRD signature/microsatellite status/tumor mutation burden cannot be determined, IDH1 M880V. PET scan 06/20/2023-large left hilar mass primarily in the left upper lobe.  Left adrenal gland with hypermetabolic activity suspicious for early metastatic lesion. Brain MRI 06/30/2023-negative for metastatic disease Week 1 Taxol /carboplatin  07/10/2023 Radiation chest 07/11/2023; plan for simultaneous treatment left adrenal metastasis with 5 fractions of SBRT Week 2 Taxol /carboplatin  07/17/2023 Week 3 Taxol /carboplatin  07/24/2023   2.  Hoarseness/cough secondary to #1-improved following vocal cord procedure 06/29/2023 3.  Atrial fibrillation 4.  Lower extremity edema 5.  Alcohol use 6.  Large external hemorrhoid on exam 07/17/2023    Disposition: Raymond Hurst appears stable.  He continues radiation.  He has completed 2 weekly treatments with Taxol /carboplatin .  He seems to be tolerating well.  Plan to proceed with week 3 today as scheduled.  CBC and chemistry panel reviewed.  Labs adequate for treatment.  He will return for follow-up and chemotherapy in 1 week.  We are available to see him sooner if needed.    Olam Ned ANP/GNP-BC   07/24/2023  8:58 AM

## 2023-07-25 ENCOUNTER — Ambulatory Visit
Admission: RE | Admit: 2023-07-25 | Discharge: 2023-07-25 | Disposition: A | Discharge status: Home / Self Care | Source: Ambulatory Visit | Attending: Radiation Oncology

## 2023-07-25 ENCOUNTER — Ambulatory Visit: Payer: Self-pay | Admitting: Acute Care

## 2023-07-25 ENCOUNTER — Telehealth (INDEPENDENT_AMBULATORY_CARE_PROVIDER_SITE_OTHER): Payer: Self-pay | Admitting: Otolaryngology

## 2023-07-25 ENCOUNTER — Other Ambulatory Visit: Payer: Self-pay | Admitting: Internal Medicine

## 2023-07-25 ENCOUNTER — Telehealth (INDEPENDENT_AMBULATORY_CARE_PROVIDER_SITE_OTHER): Payer: Self-pay

## 2023-07-25 ENCOUNTER — Other Ambulatory Visit: Payer: Self-pay

## 2023-07-25 DIAGNOSIS — J90 Pleural effusion, not elsewhere classified: Secondary | ICD-10-CM

## 2023-07-25 DIAGNOSIS — Z87891 Personal history of nicotine dependence: Secondary | ICD-10-CM | POA: Diagnosis not present

## 2023-07-25 DIAGNOSIS — C7972 Secondary malignant neoplasm of left adrenal gland: Secondary | ICD-10-CM | POA: Diagnosis not present

## 2023-07-25 DIAGNOSIS — Z5111 Encounter for antineoplastic chemotherapy: Secondary | ICD-10-CM | POA: Diagnosis not present

## 2023-07-25 DIAGNOSIS — J439 Emphysema, unspecified: Secondary | ICD-10-CM

## 2023-07-25 DIAGNOSIS — Z510 Encounter for antineoplastic radiation therapy: Secondary | ICD-10-CM | POA: Diagnosis not present

## 2023-07-25 DIAGNOSIS — I480 Paroxysmal atrial fibrillation: Secondary | ICD-10-CM

## 2023-07-25 DIAGNOSIS — C3412 Malignant neoplasm of upper lobe, left bronchus or lung: Secondary | ICD-10-CM | POA: Diagnosis not present

## 2023-07-25 LAB — RAD ONC ARIA SESSION SUMMARY
Course Elapsed Days: 14
Plan Fractions Treated to Date: 11
Plan Prescribed Dose Per Fraction: 2 Gy
Plan Total Fractions Prescribed: 35
Plan Total Prescribed Dose: 70 Gy
Reference Point Dosage Given to Date: 22 Gy
Reference Point Session Dosage Given: 2 Gy
Session Number: 11

## 2023-07-25 NOTE — Telephone Encounter (Signed)
 If he is having voice problems he needs to see ENT. I will put in a chest xray to see if there is reaccumulation of the fluid and will contact him with results. He can [facility] for the chest xray since he lives in [place]

## 2023-07-25 NOTE — Telephone Encounter (Signed)
 FYI Only or Action Required?: Action required by provider: needs call back with next steps, requesting rescue inhaler to have on-hand.  Patient is followed in Pulmonology for lung cancer, respiratory failure, and OSA, last seen on 07/03/2023 by Meade Verdon RAMAN, MD. Called Nurse Triage reporting Cough, Hoarse, and Shortness of Breath. Symptoms began a week ago. Interventions attempted: Maintenance inhaler and Other: pt wife LVM with ENT as well. Symptoms are: gradually worsening.  Triage Disposition: Go to ED or PCP/Alternative with Approval  Patient/caregiver understands and will follow disposition?: No, wishes to speak with PCP      Copied from CRM [id number]. Topic: Clinical - Red Word Triage >> Jul 25, 2023 12:08 PM Isabell A wrote: Red Word that prompted transfer to Nurse Triage: Spouse states patient may have fluid in his lungs due to his current symptoms: phlegm in throat, coughing, more hoarse then he was previously before his procedure.   Reason for Disposition  Difficulty breathing  Answer Assessment - Initial Assessment Questions Pt wife is poor historian E2C2 Pulmonary Triage - Initial Assessment Questions Chief Complaint (e.g., cough, sob, wheezing, fever, chills, sweat or additional symptoms) *Go to specific symptom protocol after initial questions. ENT for procedure a month ago, successful everyone thought For last week or more, more hoarse than was before procedure, discouraged, can talk but a lot less volume and hoarse Phlegm in throat, clears throat, talks better for few minutes until recurs Know fluid in lungs Last visit suggested something to clear out phlegm ENT follow up appt says healed up properly, LVM with them, follow up a little over a week ago A little more SOB when tries to talk Not coughed anything up, no more coughing up blood since last visit on 6/2 doesn't cough as much as he used to in very beginning, mainly his voice is just getting hoarser fainter No  chest pain, dizziness, or weakness  Have you used your inhalers/maintenance medication? Yes If yes, What medications? Breztri , does not have rescue inhaler at this point, didn't want to give him albuterol  because hx a-fib, think it would help for him to have one  OXYGEN: Do you wear supplemental oxygen? No  Do you monitor your oxygen levels? No  1. DESCRIPTION: Describe your voice. (e.g., coarse, raspy, weaker, airy, scratchy, deeper)     Hoarser, fainter  2. SEVERITY: How bad is it?   - MILD: doesn't interfere with normal activities   - MODERATE: interferes with normal activities such as school or work   - SEVERE: only able to whisper.     Above a whisper, taking a lot of effort for him to finish a sentence, don't know if because so hard to force his voice or if breathing going along with it  4. COUGH: Is there a cough? If Yes, ask: How bad is it?     yes 5. FEVER: Do you have a fever? If Yes, ask: What is your temperature, how was it measured, and when did it start?     no 9. OTHER SYMPTOMS: Do you have any other symptoms? (e.g., breathing difficulty, fever, foreign body, lymph node swelling in neck, rash, sore throat, weight loss)     Wheezing some of the time especially when tries to talk  Advised ED if confirm SOB at rest/with talking since hard time finishing sentences, go to ED if any worsening SOB or hoarseness, sending message to pulm for insight on next steps, pt wife also asking for rescue inhaler for pt  Protocols  used: [facility]

## 2023-07-25 NOTE — Telephone Encounter (Signed)
 Spoke to patient's spouse regarding voice trouble after VF injection around 4 weeks ago. Scheduled patient for procedure on Monday.

## 2023-07-25 NOTE — Telephone Encounter (Signed)
 Patient's wife called in concerned.  Patient having difficulties speaking again and was wondering if they need to come in for another injection.  Please advise.  [phone]

## 2023-07-26 ENCOUNTER — Ambulatory Visit
Admission: RE | Admit: 2023-07-26 | Discharge: 2023-07-26 | Disposition: A | Discharge status: Home / Self Care | Source: Ambulatory Visit | Attending: Radiation Oncology | Admitting: Radiation Oncology

## 2023-07-26 ENCOUNTER — Other Ambulatory Visit: Payer: Self-pay | Admitting: Internal Medicine

## 2023-07-26 ENCOUNTER — Other Ambulatory Visit: Payer: Self-pay

## 2023-07-26 DIAGNOSIS — Z510 Encounter for antineoplastic radiation therapy: Secondary | ICD-10-CM | POA: Diagnosis not present

## 2023-07-26 DIAGNOSIS — Z5111 Encounter for antineoplastic chemotherapy: Secondary | ICD-10-CM | POA: Diagnosis not present

## 2023-07-26 DIAGNOSIS — J439 Emphysema, unspecified: Secondary | ICD-10-CM

## 2023-07-26 DIAGNOSIS — I480 Paroxysmal atrial fibrillation: Secondary | ICD-10-CM

## 2023-07-26 DIAGNOSIS — C3412 Malignant neoplasm of upper lobe, left bronchus or lung: Secondary | ICD-10-CM | POA: Diagnosis not present

## 2023-07-26 DIAGNOSIS — Z87891 Personal history of nicotine dependence: Secondary | ICD-10-CM | POA: Diagnosis not present

## 2023-07-26 DIAGNOSIS — C7972 Secondary malignant neoplasm of left adrenal gland: Secondary | ICD-10-CM | POA: Diagnosis not present

## 2023-07-26 LAB — RAD ONC ARIA SESSION SUMMARY
Course Elapsed Days: 15
Plan Fractions Treated to Date: 12
Plan Prescribed Dose Per Fraction: 2 Gy
Plan Total Fractions Prescribed: 35
Plan Total Prescribed Dose: 70 Gy
Reference Point Dosage Given to Date: 24 Gy
Reference Point Session Dosage Given: 2 Gy
Session Number: 12

## 2023-07-26 MED ORDER — LEVALBUTEROL TARTRATE 45 MCG/ACT IN AERO: 1.0000 | INHALATION_SPRAY | RESPIRATORY_TRACT | 2 refills | Status: DC | PRN

## 2023-07-26 NOTE — Addendum Note (Signed)
 Addended by: Arshan Jabs on: 07/26/2023 05:37 PM   Modules accepted: Orders

## 2023-07-26 NOTE — Telephone Encounter (Signed)
 Looks like Raymond Hurst already sent xopanex inhaler. His insurance requires prior authorization. Will send to pharmacy

## 2023-07-27 ENCOUNTER — Other Ambulatory Visit (HOSPITAL_COMMUNITY): Payer: Self-pay

## 2023-07-27 ENCOUNTER — Other Ambulatory Visit: Payer: Self-pay | Admitting: *Deleted

## 2023-07-27 ENCOUNTER — Other Ambulatory Visit: Payer: Self-pay

## 2023-07-27 ENCOUNTER — Other Ambulatory Visit: Payer: Self-pay | Admitting: Oncology

## 2023-07-27 ENCOUNTER — Ambulatory Visit
Admission: RE | Admit: 2023-07-27 | Discharge: 2023-07-27 | Disposition: A | Discharge status: Home / Self Care | Source: Ambulatory Visit | Attending: Radiation Oncology | Admitting: Radiation Oncology

## 2023-07-27 ENCOUNTER — Telehealth: Payer: Self-pay

## 2023-07-27 DIAGNOSIS — C3412 Malignant neoplasm of upper lobe, left bronchus or lung: Secondary | ICD-10-CM | POA: Diagnosis not present

## 2023-07-27 DIAGNOSIS — Z510 Encounter for antineoplastic radiation therapy: Secondary | ICD-10-CM | POA: Diagnosis not present

## 2023-07-27 DIAGNOSIS — Z87891 Personal history of nicotine dependence: Secondary | ICD-10-CM | POA: Diagnosis not present

## 2023-07-27 DIAGNOSIS — C7972 Secondary malignant neoplasm of left adrenal gland: Secondary | ICD-10-CM | POA: Diagnosis not present

## 2023-07-27 DIAGNOSIS — Z5111 Encounter for antineoplastic chemotherapy: Secondary | ICD-10-CM | POA: Diagnosis not present

## 2023-07-27 DIAGNOSIS — C7970 Secondary malignant neoplasm of unspecified adrenal gland: Secondary | ICD-10-CM

## 2023-07-27 LAB — RAD ONC ARIA SESSION SUMMARY
Course Elapsed Days: 16
Plan Fractions Treated to Date: 13
Plan Prescribed Dose Per Fraction: 2 Gy
Plan Total Fractions Prescribed: 35
Plan Total Prescribed Dose: 70 Gy
Reference Point Dosage Given to Date: 26 Gy
Reference Point Session Dosage Given: 2 Gy
Session Number: 13

## 2023-07-27 NOTE — Telephone Encounter (Signed)
 PA requested

## 2023-07-27 NOTE — Telephone Encounter (Signed)
 PA Case: [id number], Status: Approved, Coverage Starts on: 02/01/2023 12:00:00 AM, Coverage Ends on: 01/31/2024 12:00:00 AM. Questions? Contact [phone].

## 2023-07-27 NOTE — Progress Notes (Unsigned)
 [employer] testing requested for PDL-1   on accession number [id number]

## 2023-07-27 NOTE — Telephone Encounter (Signed)
 ATC x1.  Left detailed message per DPR letting him know that the Xopenex  inhaler was requiring a PA and that had been sent to our pharmacy team to begin that proces and we would call him once we had a decision.  Advised to call with any questions.

## 2023-07-27 NOTE — Telephone Encounter (Signed)
 PA submitted and med is now approved!

## 2023-07-27 NOTE — Telephone Encounter (Signed)
 Pharmacy team please start a PA for the levalbuterol , patient has Paroxysmal atrial fibrillation (HCC), COPD with chronic bronchitis and emphysema (HCC).  Albuterol /ventolin  are contraindicated with this diagnosis.  Thank you.  Dr. Meade, awaiting PA for Levalbuterol .

## 2023-07-28 ENCOUNTER — Ambulatory Visit
Admission: RE | Admit: 2023-07-28 | Discharge: 2023-07-28 | Disposition: A | Discharge status: Home / Self Care | Source: Ambulatory Visit | Attending: Radiation Oncology | Admitting: Radiation Oncology

## 2023-07-28 ENCOUNTER — Ambulatory Visit
Admission: RE | Admit: 2023-07-28 | Discharge: 2023-07-28 | Disposition: A | Discharge status: Home / Self Care | Source: Ambulatory Visit | Attending: Radiation Oncology

## 2023-07-28 ENCOUNTER — Other Ambulatory Visit: Payer: Self-pay

## 2023-07-28 DIAGNOSIS — Z510 Encounter for antineoplastic radiation therapy: Secondary | ICD-10-CM | POA: Diagnosis not present

## 2023-07-28 DIAGNOSIS — Z5111 Encounter for antineoplastic chemotherapy: Secondary | ICD-10-CM | POA: Diagnosis not present

## 2023-07-28 DIAGNOSIS — C3412 Malignant neoplasm of upper lobe, left bronchus or lung: Secondary | ICD-10-CM | POA: Diagnosis not present

## 2023-07-28 DIAGNOSIS — Z87891 Personal history of nicotine dependence: Secondary | ICD-10-CM | POA: Diagnosis not present

## 2023-07-28 DIAGNOSIS — C7972 Secondary malignant neoplasm of left adrenal gland: Secondary | ICD-10-CM | POA: Diagnosis not present

## 2023-07-28 LAB — RAD ONC ARIA SESSION SUMMARY
Course Elapsed Days: 17
Plan Fractions Treated to Date: 14
Plan Prescribed Dose Per Fraction: 2 Gy
Plan Total Fractions Prescribed: 35
Plan Total Prescribed Dose: 70 Gy
Reference Point Dosage Given to Date: 28 Gy
Reference Point Session Dosage Given: 2 Gy
Session Number: 14

## 2023-07-28 NOTE — Telephone Encounter (Signed)
 ATC x1 (patient), left message letting patient know that his PA for Levalbuterol  was approved.  Called [facility] pharmacy and let them know that the PA was approved.  They ran it again and it was approved.  Nothing further needed.

## 2023-07-31 ENCOUNTER — Other Ambulatory Visit: Payer: Self-pay

## 2023-07-31 ENCOUNTER — Encounter (INDEPENDENT_AMBULATORY_CARE_PROVIDER_SITE_OTHER): Payer: Self-pay | Admitting: Otolaryngology

## 2023-07-31 ENCOUNTER — Ambulatory Visit
Admission: RE | Admit: 2023-07-31 | Discharge: 2023-07-31 | Disposition: A | Discharge status: Home / Self Care | Source: Ambulatory Visit | Attending: Radiation Oncology

## 2023-07-31 ENCOUNTER — Ambulatory Visit: Admitting: Radiation Oncology

## 2023-07-31 ENCOUNTER — Ambulatory Visit (INDEPENDENT_AMBULATORY_CARE_PROVIDER_SITE_OTHER): Admitting: Otolaryngology

## 2023-07-31 VITALS — BP 122/71

## 2023-07-31 DIAGNOSIS — Z87891 Personal history of nicotine dependence: Secondary | ICD-10-CM | POA: Diagnosis not present

## 2023-07-31 DIAGNOSIS — Z5111 Encounter for antineoplastic chemotherapy: Secondary | ICD-10-CM | POA: Diagnosis not present

## 2023-07-31 DIAGNOSIS — Z510 Encounter for antineoplastic radiation therapy: Secondary | ICD-10-CM | POA: Diagnosis not present

## 2023-07-31 DIAGNOSIS — R490 Dysphonia: Secondary | ICD-10-CM

## 2023-07-31 DIAGNOSIS — C3412 Malignant neoplasm of upper lobe, left bronchus or lung: Secondary | ICD-10-CM | POA: Diagnosis not present

## 2023-07-31 DIAGNOSIS — C7972 Secondary malignant neoplasm of left adrenal gland: Secondary | ICD-10-CM | POA: Diagnosis not present

## 2023-07-31 DIAGNOSIS — J383 Other diseases of vocal cords: Secondary | ICD-10-CM

## 2023-07-31 DIAGNOSIS — J3800 Paralysis of vocal cords and larynx, unspecified: Secondary | ICD-10-CM

## 2023-07-31 LAB — RAD ONC ARIA SESSION SUMMARY
Course Elapsed Days: 20
Plan Fractions Treated to Date: 15
Plan Prescribed Dose Per Fraction: 2 Gy
Plan Total Fractions Prescribed: 35
Plan Total Prescribed Dose: 70 Gy
Reference Point Dosage Given to Date: 30 Gy
Reference Point Session Dosage Given: 2 Gy
Session Number: 15

## 2023-07-31 NOTE — Progress Notes (Unsigned)
 Raymond Hurst

## 2023-07-31 NOTE — Patient Instructions (Signed)

## 2023-07-31 NOTE — Progress Notes (Unsigned)
 He had decline in his voice projection again and we elected to repeat Restylane injection due to a large glottic gap after the initial injection done on 06/29/23  Therapeutic Vocal Cord Injection CPT 68425-49 ATTENDING: Elena Larry, MD   PREOPERATIVE DIAGNOSIS(ES): 1. left vocal cord paralysis 2. Hoarseness; atrophy bilateral 3. Glottic insufficiency  POSTOPERATIVE DIAGNOSIS(ES): Same  PROCEDURE PERFORMED: Laryngoscopy with restylane injection into the bilateral lateral thyroarytenoid muscle(s)  INDICATIONS FOR PROCEDURE: The risks and benefits of the surgical procedure have been explained in detail to the patient and they have elected to proceed.  CONSENT:  Informed consent was obtained prior to the procedure after discussion of risks, benefits, and alternatives and expected outcomes were discussed with the patient; consent placed in chart. The possibilities of reaction to medication, pulmonary aspiration, bleeding, infection, the need for additional procedures, failure to diagnose a condition, and creating a complication requiring transfusion or operation were discussed with the patient. The patient concurred with the proposed plan, giving informed consent.    UNIVERSAL PROTOCOL/ TIMEOUT: Preprocedure verification is complete- patient verified and consents confirmed.  ANESTHESIA: local anesthesia H&P REVIEW: The patient's history and physical were reviewed today prior to procedure. All medications were reviewed and updated as well.  PROCEDURE DETAILS:  The patient was brought to the clinic and placed in a seated position.  The anterior neck skin was then cleansed with alcohol and 1% Lidocaine  was used to infiltrate the skin overlying the thyrohyoid membrane. Patient had a NEB with 4% lidocaine  prior the start of the procedure, to ensure good anesthesia and procedure tolerance. Afrin/Lidocaine  mixture was then used to anesthetize the nasal passages. The 1.5-inch 25-gauge needle was bent  with two gentle curves in same direction.  The flexible laryngoscope was then passed through the patient's nasal passageway and advanced into the larynx. The nasopharynx was free of any lesions. The scope was passed behind the soft palate and directed toward the base of tongue. The supraglottic structures were normal in appearance. The true folds show atrophy and left vocal fold paralysis. The 23-gauge needle was passed through the petiole and 4% lidocaine  was dripped on the cords while the patient phonated. It was then attached to a luer-lock syringe filled with the filler and advanced into the vocal fold, and injection was performed into the bilateral thyroarytenoid muscle(s) at a site just anterior to the vocal process. A second injection was performed at mid-fold. The augmentation carried out until some overmedialization was noted. This was then repeated on the contralateral side. The needle was then removed from the vocal fold and the airway. The scope was removed from the nasal cavity. This completed the procedure. The patient tolerated the procedure well. IMPLANTS: Restylane-L Hyaluronic Acid  ESTIMATED BLOOD LOSS: None  SPECIMEN(S) REMOVED: None  DISPOSITION OF SPECIMEN(S): NA.  FINDINGS:  No evidence of a hematoma and the airway remained patent  CONDITION: Stable  COMPLICATIONS:The patient tolerated the procedure well without apparent complications and was ambulatory.  NOTES: 2nd injection done bilaterally  PLAN: RTC 3-4 weeks for repeat videostrobe

## 2023-08-01 ENCOUNTER — Ambulatory Visit: Admitting: Radiation Oncology

## 2023-08-01 ENCOUNTER — Inpatient Hospital Stay

## 2023-08-01 ENCOUNTER — Other Ambulatory Visit: Payer: Self-pay

## 2023-08-01 ENCOUNTER — Inpatient Hospital Stay (HOSPITAL_BASED_OUTPATIENT_CLINIC_OR_DEPARTMENT_OTHER): Admitting: Oncology

## 2023-08-01 ENCOUNTER — Other Ambulatory Visit

## 2023-08-01 ENCOUNTER — Ambulatory Visit
Admission: RE | Admit: 2023-08-01 | Discharge: 2023-08-01 | Disposition: A | Discharge status: Home / Self Care | Source: Ambulatory Visit | Attending: Radiation Oncology | Admitting: Radiation Oncology

## 2023-08-01 VITALS — BP 125/76 | HR 88 | Temp 97.9°F | Resp 18 | Ht 72.0 in | Wt 285.9 lb

## 2023-08-01 DIAGNOSIS — C3412 Malignant neoplasm of upper lobe, left bronchus or lung: Secondary | ICD-10-CM | POA: Diagnosis not present

## 2023-08-01 DIAGNOSIS — C801 Malignant (primary) neoplasm, unspecified: Secondary | ICD-10-CM | POA: Diagnosis not present

## 2023-08-01 DIAGNOSIS — Z5111 Encounter for antineoplastic chemotherapy: Secondary | ICD-10-CM | POA: Insufficient documentation

## 2023-08-01 DIAGNOSIS — C781 Secondary malignant neoplasm of mediastinum: Secondary | ICD-10-CM | POA: Insufficient documentation

## 2023-08-01 DIAGNOSIS — Z87891 Personal history of nicotine dependence: Secondary | ICD-10-CM | POA: Diagnosis not present

## 2023-08-01 DIAGNOSIS — C7972 Secondary malignant neoplasm of left adrenal gland: Secondary | ICD-10-CM | POA: Diagnosis not present

## 2023-08-01 DIAGNOSIS — C7492 Malignant neoplasm of unspecified part of left adrenal gland: Secondary | ICD-10-CM | POA: Diagnosis not present

## 2023-08-01 DIAGNOSIS — Z510 Encounter for antineoplastic radiation therapy: Secondary | ICD-10-CM | POA: Diagnosis not present

## 2023-08-01 LAB — RAD ONC ARIA SESSION SUMMARY
Course Elapsed Days: 21
Plan Fractions Treated to Date: 1
Plan Fractions Treated to Date: 16
Plan Prescribed Dose Per Fraction: 10 Gy
Plan Prescribed Dose Per Fraction: 2 Gy
Plan Total Fractions Prescribed: 35
Plan Total Fractions Prescribed: 5
Plan Total Prescribed Dose: 50 Gy
Plan Total Prescribed Dose: 70 Gy
Reference Point Dosage Given to Date: 10 Gy
Reference Point Dosage Given to Date: 32 Gy
Reference Point Session Dosage Given: 10 Gy
Reference Point Session Dosage Given: 2 Gy
Session Number: 16

## 2023-08-01 LAB — CBC WITH DIFFERENTIAL (CANCER CENTER ONLY)
Abs Immature Granulocytes: 0.02 10*3/uL (ref 0.00–0.07)
Basophils Absolute: 0 10*3/uL (ref 0.0–0.1)
Basophils Relative: 1 %
Eosinophils Absolute: 0 10*3/uL (ref 0.0–0.5)
Eosinophils Relative: 1 %
HCT: 32.9 % — ABNORMAL LOW (ref 39.0–52.0)
Hemoglobin: 10.7 g/dL — ABNORMAL LOW (ref 13.0–17.0)
Immature Granulocytes: 1 %
Lymphocytes Relative: 9 %
Lymphs Abs: 0.4 10*3/uL — ABNORMAL LOW (ref 0.7–4.0)
MCH: 27 pg (ref 26.0–34.0)
MCHC: 32.5 g/dL (ref 30.0–36.0)
MCV: 82.9 fL (ref 80.0–100.0)
Monocytes Absolute: 0.4 10*3/uL (ref 0.1–1.0)
Monocytes Relative: 11 %
Neutro Abs: 3 10*3/uL (ref 1.7–7.7)
Neutrophils Relative %: 77 %
Platelet Count: 206 10*3/uL (ref 150–400)
RBC: 3.97 MIL/uL — ABNORMAL LOW (ref 4.22–5.81)
RDW: 17.6 % — ABNORMAL HIGH (ref 11.5–15.5)
WBC Count: 3.9 10*3/uL — ABNORMAL LOW (ref 4.0–10.5)
nRBC: 0 % (ref 0.0–0.2)

## 2023-08-01 LAB — CMP (CANCER CENTER ONLY)
ALT: 12 U/L (ref 0–44)
AST: 13 U/L — ABNORMAL LOW (ref 15–41)
Albumin: 3.1 g/dL — ABNORMAL LOW (ref 3.5–5.0)
Alkaline Phosphatase: 59 U/L (ref 38–126)
Anion gap: 10 (ref 5–15)
BUN: 7 mg/dL — ABNORMAL LOW (ref 8–23)
CO2: 23 mmol/L (ref 22–32)
Calcium: 9.1 mg/dL (ref 8.9–10.3)
Chloride: 104 mmol/L (ref 98–111)
Creatinine: 0.6 mg/dL — ABNORMAL LOW (ref 0.61–1.24)
GFR, Estimated: >60 mL/min (ref >60)
Glucose, Bld: 99 mg/dL (ref 70–99)
Potassium: 4 mmol/L (ref 3.5–5.1)
Sodium: 136 mmol/L (ref 135–145)
Total Bilirubin: 0.4 mg/dL (ref 0.0–1.2)
Total Protein: 6.3 g/dL — ABNORMAL LOW (ref 6.5–8.1)

## 2023-08-01 MED ORDER — PALONOSETRON HCL INJECTION 0.25 MG/5ML
0.2500 mg | Freq: Once | INTRAVENOUS | Status: AC
  Administered 2023-08-01: 0.25 mg via INTRAVENOUS
  Filled 2023-08-01: qty 5

## 2023-08-01 MED ORDER — SODIUM CHLORIDE 0.9 % IV SOLN
50.0000 mg/m2 | Freq: Once | INTRAVENOUS | Status: AC
  Administered 2023-08-01: 126 mg via INTRAVENOUS
  Filled 2023-08-01: qty 21

## 2023-08-01 MED ORDER — SODIUM CHLORIDE 0.9 % IV SOLN
250.0000 mg | Freq: Once | INTRAVENOUS | Status: AC
  Administered 2023-08-01: 250 mg via INTRAVENOUS
  Filled 2023-08-01: qty 25

## 2023-08-01 MED ORDER — DIPHENHYDRAMINE HCL 50 MG/ML IJ SOLN
25.0000 mg | Freq: Once | INTRAMUSCULAR | Status: AC
  Administered 2023-08-01: 25 mg via INTRAVENOUS
  Filled 2023-08-01: qty 1

## 2023-08-01 MED ORDER — SODIUM CHLORIDE 0.9 % IV SOLN: INTRAVENOUS | Status: DC

## 2023-08-01 MED ORDER — FAMOTIDINE IN NACL 20-0.9 MG/50ML-% IV SOLN
20.0000 mg | Freq: Once | INTRAVENOUS | Status: AC
  Administered 2023-08-01: 20 mg via INTRAVENOUS
  Filled 2023-08-01: qty 50

## 2023-08-01 MED ORDER — DEXAMETHASONE SODIUM PHOSPHATE 10 MG/ML IJ SOLN
10.0000 mg | Freq: Once | INTRAMUSCULAR | Status: AC
  Administered 2023-08-01: 10 mg via INTRAVENOUS
  Filled 2023-08-01: qty 1

## 2023-08-01 NOTE — Progress Notes (Signed)
 [facility] OFFICE PROGRESS NOTE   Diagnosis: Non-small cell lung cancer  INTERVAL HISTORY:   Raymond Hurst returns as scheduled.  He continues chest radiation.  He completed another treatment with Taxol /carboplatin  on 07/24/2023.  No nausea/vomiting, neuropathy symptoms, or symptoms of an allergic reaction.  No dysphagia.  He has mild dyspnea.  He reports constipation followed by diarrhea last week.  He relates the diarrhea to eating bad strawberries .  He underwent a procedure for vocal cord paralysis yesterday.  Objective:  Vital signs in last 24 hours:  Blood pressure 125/76, pulse 88, temperature 97.9 F (36.6 C), resp. rate 18, height 6' (1.829 m), weight 285 lb 14.4 oz (129.7 kg), SpO2 98%.    HEENT: No thrush or ulcers Resp: Bilateral expiratory wheeze, good air movement bilaterally, no respiratory distress Cardio: Irregular GI: Nontender, no hepatosplenomegaly Vascular: 1-2+ edema at the lower leg bilaterally  Lab Results:  Lab Results  Component Value Date   WBC 3.0 (L) 07/24/2023   HGB 11.2 (L) 07/24/2023   HCT 33.9 (L) 07/24/2023   MCV 82.9 07/24/2023   PLT 160 07/24/2023   NEUTROABS 2.3 07/24/2023    CMP  Lab Results  Component Value Date   NA 136 07/24/2023   K 4.1 07/24/2023   CL 100 07/24/2023   CO2 23 07/24/2023   GLUCOSE 178 (H) 07/24/2023   BUN 9 07/24/2023   CREATININE 0.71 07/24/2023   CALCIUM  9.1 07/24/2023   PROT 6.3 (L) 07/24/2023   ALBUMIN 3.3 (L) 07/24/2023   AST 12 (L) 07/24/2023   ALT 10 07/24/2023   ALKPHOS 61 07/24/2023   BILITOT 0.5 07/24/2023   GFRNONAA >60 07/24/2023   GFRAA >60 06/13/2019     Medications: I have reviewed the patient's current medications.   Assessment/Plan: Left lung mass CT chest 06/05/2023: Left upper lobe mass with invasion of the mediastinum and effacement of the left upper lobe and lower lobe pulmonary arteries, moderate left pleural effusion, small mediastinal lymph nodes, right adrenal  mass favoring myolipoma, probable benign lucent lesion at the right scapula Diagnostic left thoracentesis 06/06/2023: Cytology-no malignant cells identified Bronchoscopy with endobronchial ultrasound and biopsies 06/12/2023-lateral left mainstem bronchial irregularity extending into the left upper lobe airways narrowing the left upper lobe airways, no discrete endobronchial lesion.  Endobronchial ultrasound showed a left hilar mass with significant vascularity; enlargement at station 4L, 11L, 7.  Station 7, 4L and 11L lymph nodes negative for malignancy; left hilar mass/lymph node metastatic carcinoma; left upper lobe endobronchial brushing malignant cells present; left upper lobe endobronchial biopsy suspicious for malignancy; weak focal p63 positivity in the tumor cells, negative for TTF-1, synaptophysin and CD56, a poorly differentiated squamous cell carcinoma is favored; PD-L1 pending; foundation 1-HRD signature/microsatellite status/tumor mutation burden cannot be determined, IDH1 M880V. PET scan 06/20/2023-large left hilar mass primarily in the left upper lobe.  Left adrenal gland with hypermetabolic activity suspicious for early metastatic lesion. Brain MRI 06/30/2023-negative for metastatic disease Week 1 Taxol /carboplatin  07/10/2023 Radiation chest 07/11/2023; plan for simultaneous treatment left adrenal metastasis with 5 fractions of SBRT Week 2 Taxol /carboplatin  07/17/2023 Week 3 Taxol /carboplatin  07/24/2023 Week 4 Taxol /carboplatin  08/01/2023   2.  Hoarseness/cough secondary to #1-improved following vocal cord procedure 06/29/2023, Restylane injection into bilateral lateral thyroid  arytenoid muscles 07/31/2023 3.  Atrial fibrillation 4.  Lower extremity edema 5.  Alcohol use 6.  Large external hemorrhoid on exam 07/17/2023      Disposition: Raymond Hurst has a history of oligometastatic non-small cell lung cancer.  He is completing treatment with concurrent chemotherapy/radiation.  He has tolerated the  treatment well to date.  He will complete another week of Taxol /carboplatin  today.  He continues daily radiation.  We are waiting on results of PD-1 testing to decide on the indication for adding immunotherapy at the completion of chemotherapy/radiation.  Raymond Hurst will return for an office visit in 1 week.  Arley Hof, MD  08/01/2023  8:11 AM

## 2023-08-01 NOTE — Patient Instructions (Signed)
 [facility] - A DEPT OF [facility]  Discharge Instructions: Thank you for choosing [facility] to provide your oncology and hematology care.   If you have a lab appointment with the Cancer Center, please go directly to the Cancer Center and check in at the registration area.   Wear comfortable clothing and clothing appropriate for easy access to any Portacath or PICC line.   We strive to give you quality time with your provider. You may need to reschedule your appointment if you arrive late (15 or more minutes).  Arriving late affects you and other patients whose appointments are after yours.  Also, if you miss three or more appointments without notifying the office, you may be dismissed from the clinic at the provider's discretion.      For prescription refill requests, have your pharmacy contact our office and allow 72 hours for refills to be completed.    Today you received the following chemotherapy and/or immunotherapy agents: paclitaxel and carboplatin      To help prevent nausea and vomiting after your treatment, we encourage you to take your nausea medication as directed.  BELOW ARE SYMPTOMS THAT SHOULD BE REPORTED IMMEDIATELY: *FEVER GREATER THAN 100.4 F (38 C) OR HIGHER *CHILLS OR SWEATING *NAUSEA AND VOMITING THAT IS NOT CONTROLLED WITH YOUR NAUSEA MEDICATION *UNUSUAL SHORTNESS OF BREATH *UNUSUAL BRUISING OR BLEEDING *URINARY PROBLEMS (pain or burning when urinating, or frequent urination) *BOWEL PROBLEMS (unusual diarrhea, constipation, pain near the anus) TENDERNESS IN MOUTH AND THROAT WITH OR WITHOUT PRESENCE OF ULCERS (sore throat, sores in mouth, or a toothache) UNUSUAL RASH, SWELLING OR PAIN  UNUSUAL VAGINAL DISCHARGE OR ITCHING   Items with * indicate a potential emergency and should be followed up as soon as possible or go to the Emergency Department if any problems should occur.  Please show the CHEMOTHERAPY ALERT CARD  or IMMUNOTHERAPY ALERT CARD at check-in to the Emergency Department and triage nurse.  Should you have questions after your visit or need to cancel or reschedule your appointment, please contact [facility] - A DE[facility]  Dept: [phone]  and follow the prompts.  Office hours are 8:00 a.m. to 4:30 p.m. Monday - Friday. Please note that voicemails left after 4:00 p.m. may not be returned until the following business day.  We are closed weekends and major holidays. You have access to a nurse at all times for urgent questions. Please call the main number to the clinic Dept: [phone] and follow the prompts.   For any non-urgent questions, you may also contact your provider using MyChart. We now offer e-Visits for anyone 57 and older to request care online for non-urgent symptoms. For details visit [email or web address].   Also download the MyChart app! Go to the app store, search "MyChart", open the app, select [facility], and log in with your MyChart username and password.

## 2023-08-01 NOTE — Progress Notes (Signed)
 Per MD keep dose at 250 mg

## 2023-08-01 NOTE — Progress Notes (Signed)
 Patient seen by Dr. Thornton Papas today  Vitals are within treatment parameters:Yes   Labs are within treatment parameters: Yes   Treatment plan has been signed: Yes   Per physician team, Patient is ready for treatment and there are NO modifications to the treatment plan.

## 2023-08-02 ENCOUNTER — Ambulatory Visit

## 2023-08-02 ENCOUNTER — Ambulatory Visit
Admission: RE | Admit: 2023-08-02 | Discharge: 2023-08-02 | Disposition: A | Discharge status: Home / Self Care | Source: Ambulatory Visit | Attending: Radiation Oncology | Admitting: Radiation Oncology

## 2023-08-02 ENCOUNTER — Ambulatory Visit: Admitting: Radiation Oncology

## 2023-08-02 ENCOUNTER — Other Ambulatory Visit: Payer: Self-pay

## 2023-08-02 DIAGNOSIS — C7972 Secondary malignant neoplasm of left adrenal gland: Secondary | ICD-10-CM | POA: Diagnosis not present

## 2023-08-02 DIAGNOSIS — C801 Malignant (primary) neoplasm, unspecified: Secondary | ICD-10-CM | POA: Diagnosis not present

## 2023-08-02 DIAGNOSIS — C3412 Malignant neoplasm of upper lobe, left bronchus or lung: Secondary | ICD-10-CM | POA: Diagnosis not present

## 2023-08-02 LAB — RAD ONC ARIA SESSION SUMMARY
Course Elapsed Days: 22
Plan Fractions Treated to Date: 17
Plan Prescribed Dose Per Fraction: 2 Gy
Plan Total Fractions Prescribed: 35
Plan Total Prescribed Dose: 70 Gy
Reference Point Dosage Given to Date: 34 Gy
Reference Point Session Dosage Given: 2 Gy
Session Number: 17

## 2023-08-03 ENCOUNTER — Ambulatory Visit
Admission: RE | Admit: 2023-08-03 | Discharge: 2023-08-03 | Disposition: A | Discharge status: Home / Self Care | Source: Ambulatory Visit | Attending: Radiation Oncology | Admitting: Radiation Oncology

## 2023-08-03 ENCOUNTER — Other Ambulatory Visit: Payer: Self-pay

## 2023-08-03 ENCOUNTER — Ambulatory Visit

## 2023-08-03 ENCOUNTER — Ambulatory Visit: Admitting: Radiation Oncology

## 2023-08-03 DIAGNOSIS — Z87891 Personal history of nicotine dependence: Secondary | ICD-10-CM | POA: Diagnosis not present

## 2023-08-03 DIAGNOSIS — C801 Malignant (primary) neoplasm, unspecified: Secondary | ICD-10-CM | POA: Diagnosis not present

## 2023-08-03 DIAGNOSIS — C3412 Malignant neoplasm of upper lobe, left bronchus or lung: Secondary | ICD-10-CM | POA: Diagnosis not present

## 2023-08-03 DIAGNOSIS — C7492 Malignant neoplasm of unspecified part of left adrenal gland: Secondary | ICD-10-CM | POA: Diagnosis not present

## 2023-08-03 DIAGNOSIS — C7972 Secondary malignant neoplasm of left adrenal gland: Secondary | ICD-10-CM | POA: Diagnosis not present

## 2023-08-03 DIAGNOSIS — Z510 Encounter for antineoplastic radiation therapy: Secondary | ICD-10-CM | POA: Diagnosis not present

## 2023-08-03 LAB — RAD ONC ARIA SESSION SUMMARY
Course Elapsed Days: 23
Plan Fractions Treated to Date: 18
Plan Fractions Treated to Date: 2
Plan Prescribed Dose Per Fraction: 10 Gy
Plan Prescribed Dose Per Fraction: 2 Gy
Plan Total Fractions Prescribed: 35
Plan Total Fractions Prescribed: 5
Plan Total Prescribed Dose: 50 Gy
Plan Total Prescribed Dose: 70 Gy
Reference Point Dosage Given to Date: 20 Gy
Reference Point Dosage Given to Date: 36 Gy
Reference Point Session Dosage Given: 10 Gy
Reference Point Session Dosage Given: 2 Gy
Session Number: 18

## 2023-08-06 ENCOUNTER — Other Ambulatory Visit: Payer: Self-pay | Admitting: Oncology

## 2023-08-07 ENCOUNTER — Ambulatory Visit
Admission: RE | Admit: 2023-08-07 | Discharge: 2023-08-07 | Disposition: A | Discharge status: Home / Self Care | Source: Ambulatory Visit | Attending: Radiation Oncology | Admitting: Radiation Oncology

## 2023-08-07 ENCOUNTER — Other Ambulatory Visit: Payer: Self-pay

## 2023-08-07 DIAGNOSIS — R600 Localized edema: Secondary | ICD-10-CM | POA: Diagnosis not present

## 2023-08-07 DIAGNOSIS — C7972 Secondary malignant neoplasm of left adrenal gland: Secondary | ICD-10-CM | POA: Diagnosis not present

## 2023-08-07 DIAGNOSIS — Z6838 Body mass index (BMI) 38.0-38.9, adult: Secondary | ICD-10-CM | POA: Diagnosis not present

## 2023-08-07 DIAGNOSIS — Z87891 Personal history of nicotine dependence: Secondary | ICD-10-CM | POA: Diagnosis not present

## 2023-08-07 DIAGNOSIS — I4891 Unspecified atrial fibrillation: Secondary | ICD-10-CM | POA: Diagnosis not present

## 2023-08-07 DIAGNOSIS — C801 Malignant (primary) neoplasm, unspecified: Secondary | ICD-10-CM

## 2023-08-07 DIAGNOSIS — C7492 Malignant neoplasm of unspecified part of left adrenal gland: Secondary | ICD-10-CM | POA: Diagnosis not present

## 2023-08-07 DIAGNOSIS — D6869 Other thrombophilia: Secondary | ICD-10-CM | POA: Diagnosis not present

## 2023-08-07 DIAGNOSIS — C3492 Malignant neoplasm of unspecified part of left bronchus or lung: Secondary | ICD-10-CM | POA: Diagnosis not present

## 2023-08-07 DIAGNOSIS — E790 Hyperuricemia without signs of inflammatory arthritis and tophaceous disease: Secondary | ICD-10-CM | POA: Diagnosis not present

## 2023-08-07 DIAGNOSIS — C3412 Malignant neoplasm of upper lobe, left bronchus or lung: Secondary | ICD-10-CM | POA: Diagnosis not present

## 2023-08-07 DIAGNOSIS — Z510 Encounter for antineoplastic radiation therapy: Secondary | ICD-10-CM | POA: Diagnosis not present

## 2023-08-07 LAB — RAD ONC ARIA SESSION SUMMARY
Course Elapsed Days: 27
Plan Fractions Treated to Date: 19
Plan Fractions Treated to Date: 3
Plan Prescribed Dose Per Fraction: 10 Gy
Plan Prescribed Dose Per Fraction: 2 Gy
Plan Total Fractions Prescribed: 35
Plan Total Fractions Prescribed: 5
Plan Total Prescribed Dose: 50 Gy
Plan Total Prescribed Dose: 70 Gy
Reference Point Dosage Given to Date: 30 Gy
Reference Point Dosage Given to Date: 38 Gy
Reference Point Session Dosage Given: 10 Gy
Reference Point Session Dosage Given: 2 Gy
Session Number: 19

## 2023-08-08 ENCOUNTER — Other Ambulatory Visit

## 2023-08-08 ENCOUNTER — Inpatient Hospital Stay

## 2023-08-08 ENCOUNTER — Inpatient Hospital Stay (HOSPITAL_BASED_OUTPATIENT_CLINIC_OR_DEPARTMENT_OTHER): Admitting: Oncology

## 2023-08-08 ENCOUNTER — Ambulatory Visit
Admission: RE | Admit: 2023-08-08 | Discharge: 2023-08-08 | Disposition: A | Discharge status: Home / Self Care | Source: Ambulatory Visit | Attending: Radiation Oncology

## 2023-08-08 ENCOUNTER — Other Ambulatory Visit: Payer: Self-pay

## 2023-08-08 VITALS — BP 113/68 | HR 85 | Temp 98.3°F | Resp 18

## 2023-08-08 VITALS — BP 108/69 | HR 92 | Temp 97.8°F | Resp 18 | Ht 72.0 in | Wt 276.2 lb

## 2023-08-08 DIAGNOSIS — C801 Malignant (primary) neoplasm, unspecified: Secondary | ICD-10-CM | POA: Diagnosis not present

## 2023-08-08 DIAGNOSIS — C3412 Malignant neoplasm of upper lobe, left bronchus or lung: Secondary | ICD-10-CM

## 2023-08-08 DIAGNOSIS — Z510 Encounter for antineoplastic radiation therapy: Secondary | ICD-10-CM | POA: Diagnosis not present

## 2023-08-08 DIAGNOSIS — C7972 Secondary malignant neoplasm of left adrenal gland: Secondary | ICD-10-CM | POA: Diagnosis not present

## 2023-08-08 DIAGNOSIS — C7492 Malignant neoplasm of unspecified part of left adrenal gland: Secondary | ICD-10-CM | POA: Diagnosis not present

## 2023-08-08 DIAGNOSIS — Z87891 Personal history of nicotine dependence: Secondary | ICD-10-CM | POA: Diagnosis not present

## 2023-08-08 LAB — CMP (CANCER CENTER ONLY)
ALT: 9 U/L (ref 0–44)
AST: 11 U/L — ABNORMAL LOW (ref 15–41)
Albumin: 3.3 g/dL — ABNORMAL LOW (ref 3.5–5.0)
Alkaline Phosphatase: 56 U/L (ref 38–126)
Anion gap: 10 (ref 5–15)
BUN: 7 mg/dL — ABNORMAL LOW (ref 8–23)
CO2: 23 mmol/L (ref 22–32)
Calcium: 9 mg/dL (ref 8.9–10.3)
Chloride: 102 mmol/L (ref 98–111)
Creatinine: 0.6 mg/dL — ABNORMAL LOW (ref 0.61–1.24)
GFR, Estimated: >60 mL/min (ref >60)
Glucose, Bld: 104 mg/dL — ABNORMAL HIGH (ref 70–99)
Potassium: 4.1 mmol/L (ref 3.5–5.1)
Sodium: 135 mmol/L (ref 135–145)
Total Bilirubin: 0.5 mg/dL (ref 0.0–1.2)
Total Protein: 6.2 g/dL — ABNORMAL LOW (ref 6.5–8.1)

## 2023-08-08 LAB — RAD ONC ARIA SESSION SUMMARY
Course Elapsed Days: 28
Plan Fractions Treated to Date: 20
Plan Prescribed Dose Per Fraction: 2 Gy
Plan Total Fractions Prescribed: 35
Plan Total Prescribed Dose: 70 Gy
Reference Point Dosage Given to Date: 40 Gy
Reference Point Session Dosage Given: 2 Gy
Session Number: 20

## 2023-08-08 LAB — CBC WITH DIFFERENTIAL (CANCER CENTER ONLY)
Abs Immature Granulocytes: 0.03 K/uL (ref 0.00–0.07)
Basophils Absolute: 0 K/uL (ref 0.0–0.1)
Basophils Relative: 1 %
Eosinophils Absolute: 0.1 K/uL (ref 0.0–0.5)
Eosinophils Relative: 1 %
HCT: 34 % — ABNORMAL LOW (ref 39.0–52.0)
Hemoglobin: 11.1 g/dL — ABNORMAL LOW (ref 13.0–17.0)
Immature Granulocytes: 1 %
Lymphocytes Relative: 5 %
Lymphs Abs: 0.2 K/uL — ABNORMAL LOW (ref 0.7–4.0)
MCH: 27.5 pg (ref 26.0–34.0)
MCHC: 32.6 g/dL (ref 30.0–36.0)
MCV: 84.2 fL (ref 80.0–100.0)
Monocytes Absolute: 0.4 K/uL (ref 0.1–1.0)
Monocytes Relative: 10 %
Neutro Abs: 3.5 K/uL (ref 1.7–7.7)
Neutrophils Relative %: 82 %
Platelet Count: 185 K/uL (ref 150–400)
RBC: 4.04 MIL/uL — ABNORMAL LOW (ref 4.22–5.81)
RDW: 18.3 % — ABNORMAL HIGH (ref 11.5–15.5)
WBC Count: 4.2 K/uL (ref 4.0–10.5)
nRBC: 0 % (ref 0.0–0.2)

## 2023-08-08 MED ORDER — FAMOTIDINE IN NACL 20-0.9 MG/50ML-% IV SOLN
20.0000 mg | Freq: Once | INTRAVENOUS | Status: AC
  Administered 2023-08-08: 20 mg via INTRAVENOUS
  Filled 2023-08-08: qty 50

## 2023-08-08 MED ORDER — HEPARIN SOD (PORK) LOCK FLUSH 100 UNIT/ML IV SOLN
500.0000 [IU] | Freq: Once | INTRAVENOUS | Status: DC | PRN
Start: 2023-08-08 — End: 2023-08-08

## 2023-08-08 MED ORDER — DIPHENHYDRAMINE HCL 50 MG/ML IJ SOLN
25.0000 mg | Freq: Once | INTRAMUSCULAR | Status: AC
  Administered 2023-08-08: 25 mg via INTRAVENOUS
  Filled 2023-08-08: qty 1

## 2023-08-08 MED ORDER — DEXAMETHASONE SODIUM PHOSPHATE 10 MG/ML IJ SOLN
10.0000 mg | Freq: Once | INTRAMUSCULAR | Status: AC
  Administered 2023-08-08: 10 mg via INTRAVENOUS
  Filled 2023-08-08: qty 1

## 2023-08-08 MED ORDER — SODIUM CHLORIDE 0.9 % IV SOLN
250.0000 mg | Freq: Once | INTRAVENOUS | Status: AC
  Administered 2023-08-08: 250 mg via INTRAVENOUS
  Filled 2023-08-08: qty 25

## 2023-08-08 MED ORDER — SODIUM CHLORIDE 0.9 % IV SOLN: INTRAVENOUS | Status: DC

## 2023-08-08 MED ORDER — SODIUM CHLORIDE 0.9% FLUSH
10.0000 mL | INTRAVENOUS | Status: DC | PRN
Start: 2023-08-08 — End: 2023-08-08

## 2023-08-08 MED ORDER — SODIUM CHLORIDE 0.9 % IV SOLN
50.0000 mg/m2 | Freq: Once | INTRAVENOUS | Status: AC
  Administered 2023-08-08: 126 mg via INTRAVENOUS
  Filled 2023-08-08: qty 21

## 2023-08-08 MED ORDER — PALONOSETRON HCL INJECTION 0.25 MG/5ML
0.2500 mg | Freq: Once | INTRAVENOUS | Status: AC
  Administered 2023-08-08: 0.25 mg via INTRAVENOUS
  Filled 2023-08-08: qty 5

## 2023-08-08 NOTE — Patient Instructions (Signed)
 [facility] - A DEPT OF [facility]  Discharge Instructions: Thank you for choosing  Cancer Center to provide your oncology and hematology care.   If you have a lab appointment with the Cancer Center, please go directly to the Cancer Center and check in at the registration area.   Wear comfortable clothing and clothing appropriate for easy access to any Portacath or PICC line.   We strive to give you quality time with your provider. You may need to reschedule your appointment if you arrive late (15 or more minutes).  Arriving late affects you and other patients whose appointments are after yours.  Also, if you miss three or more appointments without notifying the office, you may be dismissed from the clinic at the provider's discretion.      For prescription refill requests, have your pharmacy contact our office and allow 72 hours for refills to be completed.    Today you received the following chemotherapy and/or immunotherapy agents Taxol  and Carboplatin .  Paclitaxel  Injection What is this medication? PACLITAXEL  (PAK li TAX el) treats some types of cancer. It works by slowing down the growth of cancer cells. This medicine may be used for other purposes; ask your health care provider or pharmacist if you have questions. COMMON BRAND NAME(S): Onxol, Taxol  What should I tell my care team before I take this medication? They need to know if you have any of these conditions: Heart disease Liver disease Low white blood cell levels An unusual or allergic reaction to paclitaxel , other medications, foods, dyes, or preservatives If you or your partner are pregnant or trying to get pregnant Breast-feeding How should I use this medication? This medication is injected into a vein. It is given by your care team in a hospital or clinic setting. Talk to your care team about the use of this medication in children. While it may be given to children for selected  conditions, precautions do apply. Overdosage: If you think you have taken too much of this medicine contact a poison control center or emergency room at once. NOTE: This medicine is only for you. Do not share this medicine with others. What if I miss a dose? Keep appointments for follow-up doses. It is important not to miss your dose. Call your care team if you are unable to keep an appointment. What may interact with this medication? Do not take this medication with any of the following: Live virus vaccines Other medications may affect the way this medication works. Talk with your care team about all of the medications you take. They may suggest changes to your treatment plan to lower the risk of side effects and to make sure your medications work as intended. This list may not describe all possible interactions. Give your health care provider a list of all the medicines, herbs, non-prescription drugs, or dietary supplements you use. Also tell them if you smoke, drink alcohol, or use illegal drugs. Some items may interact with your medicine. What should I watch for while using this medication? Your condition will be monitored carefully while you are receiving this medication. You may need blood work while taking this medication. This medication may make you feel generally unwell. This is not uncommon as chemotherapy can affect healthy cells as well as cancer cells. Report any side effects. Continue your course of treatment even though you feel ill unless your care team tells you to stop. This medication can cause serious allergic reactions. To reduce  the risk, your care team may give you other medications to take before receiving this one. Be sure to follow the directions from your care team. This medication may increase your risk of getting an infection. Call your care team for advice if you get a fever, chills, sore throat, or other symptoms of a cold or flu. Do not treat yourself. Try to avoid  being around people who are sick. This medication may increase your risk to bruise or bleed. Call your care team if you notice any unusual bleeding. Be careful brushing or flossing your teeth or using a toothpick because you may get an infection or bleed more easily. If you have any dental work done, tell your dentist you are receiving this medication. Talk to your care team if you may be pregnant. Serious birth defects can occur if you take this medication during pregnancy. Talk to your care team before breastfeeding. Changes to your treatment plan may be needed. What side effects may I notice from receiving this medication? Side effects that you should report to your care team as soon as possible: Allergic reactions--skin rash, itching, hives, swelling of the face, lips, tongue, or throat Heart rhythm changes--fast or irregular heartbeat, dizziness, feeling faint or lightheaded, chest pain, trouble breathing Increase in blood pressure Infection--fever, chills, cough, sore throat, wounds that don't heal, pain or trouble when passing urine, general feeling of discomfort or being unwell Low blood pressure--dizziness, feeling faint or lightheaded, blurry vision Low red blood cell level--unusual weakness or fatigue, dizziness, headache, trouble breathing Painful swelling, warmth, or redness of the skin, blisters or sores at the infusion site Pain, tingling, or numbness in the hands or feet Slow heartbeat--dizziness, feeling faint or lightheaded, confusion, trouble breathing, unusual weakness or fatigue Unusual bruising or bleeding Side effects that usually do not require medical attention (report to your care team if they continue or are bothersome): Diarrhea Hair loss Joint pain Loss of appetite Muscle pain Nausea Vomiting This list may not describe all possible side effects. Call your doctor for medical advice about side effects. You may report side effects to FDA at 1-800-FDA-1088. Where  should I keep my medication? This medication is given in a hospital or clinic. It will not be stored at home. NOTE: This sheet is a summary. It may not cover all possible information. If you have questions about this medicine, talk to your doctor, pharmacist, or health care provider.  2024 Elsevier/Gold Standard (2021-06-08 00:00:00)  Carboplatin  Injection What is this medication? CARBOPLATIN  (KAR boe pla tin) treats some types of cancer. It works by slowing down the growth of cancer cells. This medicine may be used for other purposes; ask your health care provider or pharmacist if you have questions. COMMON BRAND NAME(S): Paraplatin  What should I tell my care team before I take this medication? They need to know if you have any of these conditions: Blood disorders Hearing problems Kidney disease Recent or ongoing radiation therapy An unusual or allergic reaction to carboplatin , cisplatin, other medications, foods, dyes, or preservatives Pregnant or trying to get pregnant Breast-feeding How should I use this medication? This medication is injected into a vein. It is given by your care team in a hospital or clinic setting. Talk to your care team about the use of this medication in children. Special care may be needed. Overdosage: If you think you have taken too much of this medicine contact a poison control center or emergency room at once. NOTE: This medicine is only for you.  Do not share this medicine with others. What if I miss a dose? Keep appointments for follow-up doses. It is important not to miss your dose. Call your care team if you are unable to keep an appointment. What may interact with this medication? Medications for seizures Some antibiotics, such as amikacin, gentamicin, neomycin, streptomycin, tobramycin Vaccines This list may not describe all possible interactions. Give your health care provider a list of all the medicines, herbs, non-prescription drugs, or dietary  supplements you use. Also tell them if you smoke, drink alcohol, or use illegal drugs. Some items may interact with your medicine. What should I watch for while using this medication? Your condition will be monitored carefully while you are receiving this medication. You may need blood work while taking this medication. This medication may make you feel generally unwell. This is not uncommon, as chemotherapy can affect healthy cells as well as cancer cells. Report any side effects. Continue your course of treatment even though you feel ill unless your care team tells you to stop. In some cases, you may be given additional medications to help with side effects. Follow all directions for their use. This medication may increase your risk of getting an infection. Call your care team for advice if you get a fever, chills, sore throat, or other symptoms of a cold or flu. Do not treat yourself. Try to avoid being around people who are sick. Avoid taking medications that contain aspirin , acetaminophen , ibuprofen, naproxen, or ketoprofen unless instructed by your care team. These medications may hide a fever. Be careful brushing or flossing your teeth or using a toothpick because you may get an infection or bleed more easily. If you have any dental work done, tell your dentist you are receiving this medication. Talk to your care team if you wish to become pregnant or think you might be pregnant. This medication can cause serious birth defects. Talk to your care team about effective forms of contraception. Do not breast-feed while taking this medication. What side effects may I notice from receiving this medication? Side effects that you should report to your care team as soon as possible: Allergic reactions--skin rash, itching, hives, swelling of the face, lips, tongue, or throat Infection--fever, chills, cough, sore throat, wounds that don't heal, pain or trouble when passing urine, general feeling of  discomfort or being unwell Low red blood cell level--unusual weakness or fatigue, dizziness, headache, trouble breathing Pain, tingling, or numbness in the hands or feet, muscle weakness, change in vision, confusion or trouble speaking, loss of balance or coordination, trouble walking, seizures Unusual bruising or bleeding Side effects that usually do not require medical attention (report to your care team if they continue or are bothersome): Hair loss Nausea Unusual weakness or fatigue Vomiting This list may not describe all possible side effects. Call your doctor for medical advice about side effects. You may report side effects to FDA at 1-800-FDA-1088. Where should I keep my medication? This medication is given in a hospital or clinic. It will not be stored at home. NOTE: This sheet is a summary. It may not cover all possible information. If you have questions about this medicine, talk to your doctor, pharmacist, or health care provider.  2024 Elsevier/Gold Standard (2021-05-11 00:00:00)   To help prevent nausea and vomiting after your treatment, we encourage you to take your nausea medication as directed.  BELOW ARE SYMPTOMS THAT SHOULD BE REPORTED IMMEDIATELY: *FEVER GREATER THAN 100.4 F (38 C) OR HIGHER *CHILLS OR SWEATING *  NAUSEA AND VOMITING THAT IS NOT CONTROLLED WITH YOUR NAUSEA MEDICATION *UNUSUAL SHORTNESS OF BREATH *UNUSUAL BRUISING OR BLEEDING *URINARY PROBLEMS (pain or burning when urinating, or frequent urination) *BOWEL PROBLEMS (unusual diarrhea, constipation, pain near the anus) TENDERNESS IN MOUTH AND THROAT WITH OR WITHOUT PRESENCE OF ULCERS (sore throat, sores in mouth, or a toothache) UNUSUAL RASH, SWELLING OR PAIN  UNUSUAL VAGINAL DISCHARGE OR ITCHING   Items with * indicate a potential emergency and should be followed up as soon as possible or go to the Emergency Department if any problems should occur.  Please show the CHEMOTHERAPY ALERT CARD or  IMMUNOTHERAPY ALERT CARD at check-in to the Emergency Department and triage nurse.  Should you have questions after your visit or need to cancel or reschedule your appointment, please contact [facility] - A DEPT OF [facility]  Dept: [phone]  and follow the prompts.  Office hours are 8:00 a.m. to 4:30 p.m. Monday - Friday. Please note that voicemails left after 4:00 p.m. may not be returned until the following business day.  We are closed weekends and major holidays. You have access to a nurse at all times for urgent questions. Please call the main number to the clinic Dept: [phone] and follow the prompts.   For any non-urgent questions, you may also contact your provider using MyChart. We now offer e-Visits for anyone 23 and older to request care online for non-urgent symptoms. For details visit [email or web address].   Also download the MyChart app! Go to the app store, search MyChart, open the app, select [place], and log in with your MyChart username and password.

## 2023-08-08 NOTE — Progress Notes (Signed)
 [name] OFFICE PROGRESS NOTE   Diagnosis: Non-small cell lung cancer  INTERVAL HISTORY:   Raymond. Hurst returns as scheduled.  He continues radiation.  He completed another cycle of Taxol /carboplatin  on 08/01/2023.  No nausea/vomiting, symptoms of allergic reaction, or neuropathy symptoms.  No odynophagia.  He has developed alopecia  Objective:  Vital signs in last 24 hours:  Blood pressure 108/69, pulse 92, temperature 97.8 F (36.6 C), resp. rate 18, height 6' (1.829 m), weight 276 lb 3.2 oz (125.3 kg), SpO2 93%.    HEENT: No thrush Resp: Good air movement bilaterally, bilateral expiratory wheeze Cardio: Irregular GI: Nontender, no hepatosplenomegaly Vascular: Edema to lower leg bilaterally   Lab Results:  Lab Results  Component Value Date   WBC 4.2 08/08/2023   HGB 11.1 (L) 08/08/2023   HCT 34.0 (L) 08/08/2023   MCV 84.2 08/08/2023   PLT 185 08/08/2023   NEUTROABS 3.5 08/08/2023    CMP  Lab Results  Component Value Date   NA 135 08/08/2023   K 4.1 08/08/2023   CL 102 08/08/2023   CO2 23 08/08/2023   GLUCOSE 104 (H) 08/08/2023   BUN 7 (L) 08/08/2023   CREATININE 0.60 (L) 08/08/2023   CALCIUM  9.0 08/08/2023   PROT 6.2 (L) 08/08/2023   ALBUMIN 3.3 (L) 08/08/2023   AST 11 (L) 08/08/2023   ALT 9 08/08/2023   ALKPHOS 56 08/08/2023   BILITOT 0.5 08/08/2023   GFRNONAA >60 08/08/2023   GFRAA >60 06/13/2019     Medications: I have reviewed the patient's current medications.   Assessment/Plan: Left lung mass CT chest 06/05/2023: Left upper lobe mass with invasion of the mediastinum and effacement of the left upper lobe and lower lobe pulmonary arteries, moderate left pleural effusion, small mediastinal lymph nodes, right adrenal mass favoring myolipoma, probable benign lucent lesion at the right scapula Diagnostic left thoracentesis 06/06/2023: Cytology-no malignant cells identified Bronchoscopy with endobronchial ultrasound and biopsies  06/12/2023-lateral left mainstem bronchial irregularity extending into the left upper lobe airways narrowing the left upper lobe airways, no discrete endobronchial lesion.  Endobronchial ultrasound showed a left hilar mass with significant vascularity; enlargement at station 4L, 11L, 7.  Station 7, 4L and 11L lymph nodes negative for malignancy; left hilar mass/lymph node metastatic carcinoma; left upper lobe endobronchial brushing malignant cells present; left upper lobe endobronchial biopsy suspicious for malignancy; weak focal p63 positivity in the tumor cells, negative for TTF-1, synaptophysin and CD56, a poorly differentiated squamous cell carcinoma is favored; PD-L1 pending; foundation 1-HRD signature/microsatellite status/tumor mutation burden cannot be determined, IDH1 [id number]. PET scan 06/20/2023-large left hilar mass primarily in the left upper lobe.  Left adrenal gland with hypermetabolic activity suspicious for early metastatic lesion. Brain MRI 06/30/2023-negative for metastatic disease Week 1 Taxol /carboplatin  07/10/2023 Radiation chest 07/11/2023; plan for simultaneous treatment left adrenal metastasis with 5 fractions of SBRT Week 2 Taxol /carboplatin  07/17/2023 Week 3 Taxol /carboplatin  07/24/2023 Week 4 Taxol /carboplatin  08/01/2023 Week 5 Taxol /carboplatin  08/08/2023   2.  Hoarseness/cough secondary to #1-improved following vocal cord procedure 06/29/2023, Restylane injection into bilateral lateral thyroid  arytenoid muscles 07/31/2023 3.  Atrial fibrillation 4.  Lower extremity edema 5.  Alcohol use 6.  Large external hemorrhoid on exam 07/17/2023      Disposition: Raymond Hurst appears stable.  He is tolerating the Taxol /carboplatin  well.  To complete week 5 today.  He continues daily radiation.  Are waiting on results of PD-1 testing to determine the indication for immunotherapy at the completion of chemotherapy/radiation.  Raymond. Wiederholt  will return for an office visit and chemotherapy in 1  week.  Arley Hof, MD  08/08/2023  9:14 AM

## 2023-08-08 NOTE — Progress Notes (Signed)
 Patient seen by Dr. Arley Hof today  Vitals are within treatment parameters:Yes OK to proceed w/weight loss 9 lb  Labs are within treatment parameters: Yes   Treatment plan has been signed: Yes   Per physician team, Patient is ready for treatment and there are NO modifications to the treatment plan. Patient located in collaborative side flush room for treatment

## 2023-08-08 NOTE — Progress Notes (Signed)
 [facility] Clinical Social Work  Clinical Social Work was referred by medical provider for [employer] needs (discussions about pest)  Visual merchandiser met with patient to offer support and assess for needs. Patient confirmed concerns about pest in home for approx 1 year.    Interventions: CSW assessed eligibility for pest control assistance.  CSW reviewed eligibility documents needed. CSW reviewed process if pest control assistance is approved. CSW reviewed required preparation steps if assistance is approved.     Follow Up Plan:  CSW will contact patient once approval or denial is given.  Patient understands that proof of income is needed.    Lizbeth Sprague, LCSW  Clinical Social Worker [facility] [address]

## 2023-08-08 NOTE — Progress Notes (Signed)
 Patient presents today for chemotherapy infusion of Taxol  and Carboplatin . Patient is in satisfactory condition with no new complaints voiced.  Vital signs are stable.  Labs reviewed by Dr. Arley Hof during the office visit and all labs are within treatment parameters.  We will proceed with treatment per MD orders.   Patient tolerated treatment well with no complaints voiced.  Patient left ambulatory in stable condition.  Vital signs stable at discharge.  Follow up as scheduled.

## 2023-08-09 ENCOUNTER — Ambulatory Visit
Admission: RE | Admit: 2023-08-09 | Discharge: 2023-08-09 | Disposition: A | Discharge status: Home / Self Care | Source: Ambulatory Visit | Attending: Radiation Oncology | Admitting: Radiation Oncology

## 2023-08-09 ENCOUNTER — Encounter: Payer: Self-pay | Admitting: *Deleted

## 2023-08-09 ENCOUNTER — Other Ambulatory Visit: Payer: Self-pay

## 2023-08-09 DIAGNOSIS — C3412 Malignant neoplasm of upper lobe, left bronchus or lung: Secondary | ICD-10-CM | POA: Diagnosis not present

## 2023-08-09 DIAGNOSIS — C7972 Secondary malignant neoplasm of left adrenal gland: Secondary | ICD-10-CM | POA: Diagnosis not present

## 2023-08-09 DIAGNOSIS — Z87891 Personal history of nicotine dependence: Secondary | ICD-10-CM | POA: Diagnosis not present

## 2023-08-09 DIAGNOSIS — Z510 Encounter for antineoplastic radiation therapy: Secondary | ICD-10-CM | POA: Diagnosis not present

## 2023-08-09 DIAGNOSIS — G4733 Obstructive sleep apnea (adult) (pediatric): Secondary | ICD-10-CM | POA: Diagnosis not present

## 2023-08-09 DIAGNOSIS — C7492 Malignant neoplasm of unspecified part of left adrenal gland: Secondary | ICD-10-CM | POA: Diagnosis not present

## 2023-08-09 DIAGNOSIS — C801 Malignant (primary) neoplasm, unspecified: Secondary | ICD-10-CM | POA: Diagnosis not present

## 2023-08-09 DIAGNOSIS — I890 Lymphedema, not elsewhere classified: Secondary | ICD-10-CM | POA: Diagnosis not present

## 2023-08-09 DIAGNOSIS — I4821 Permanent atrial fibrillation: Secondary | ICD-10-CM | POA: Diagnosis not present

## 2023-08-09 DIAGNOSIS — Z7901 Long term (current) use of anticoagulants: Secondary | ICD-10-CM | POA: Diagnosis not present

## 2023-08-09 LAB — RAD ONC ARIA SESSION SUMMARY
Course Elapsed Days: 29
Plan Fractions Treated to Date: 21
Plan Fractions Treated to Date: 4
Plan Prescribed Dose Per Fraction: 10 Gy
Plan Prescribed Dose Per Fraction: 2 Gy
Plan Total Fractions Prescribed: 35
Plan Total Fractions Prescribed: 5
Plan Total Prescribed Dose: 50 Gy
Plan Total Prescribed Dose: 70 Gy
Reference Point Dosage Given to Date: 40 Gy
Reference Point Dosage Given to Date: 42 Gy
Reference Point Session Dosage Given: 10 Gy
Reference Point Session Dosage Given: 2 Gy
Session Number: 21

## 2023-08-09 NOTE — Progress Notes (Unsigned)
 Raymond Hurst called and left message that she would like to discuss Raymond Hurst treatment plan and molecular testing with Dr Cloretta as she is unable to come to Dr appt. Dr Cloretta given message.

## 2023-08-10 ENCOUNTER — Ambulatory Visit: Attending: Student | Admitting: Student

## 2023-08-10 ENCOUNTER — Encounter: Payer: Self-pay | Admitting: Student

## 2023-08-10 ENCOUNTER — Other Ambulatory Visit: Payer: Self-pay

## 2023-08-10 ENCOUNTER — Ambulatory Visit
Admission: RE | Admit: 2023-08-10 | Discharge: 2023-08-10 | Disposition: A | Discharge status: Home / Self Care | Source: Ambulatory Visit | Attending: Radiation Oncology | Admitting: Radiation Oncology

## 2023-08-10 VITALS — BP 110/60 | HR 89 | Ht 72.0 in | Wt 275.4 lb

## 2023-08-10 DIAGNOSIS — I890 Lymphedema, not elsewhere classified: Secondary | ICD-10-CM | POA: Diagnosis not present

## 2023-08-10 DIAGNOSIS — Z7901 Long term (current) use of anticoagulants: Secondary | ICD-10-CM

## 2023-08-10 DIAGNOSIS — C801 Malignant (primary) neoplasm, unspecified: Secondary | ICD-10-CM | POA: Diagnosis not present

## 2023-08-10 DIAGNOSIS — C7972 Secondary malignant neoplasm of left adrenal gland: Secondary | ICD-10-CM | POA: Diagnosis not present

## 2023-08-10 DIAGNOSIS — G4733 Obstructive sleep apnea (adult) (pediatric): Secondary | ICD-10-CM | POA: Diagnosis not present

## 2023-08-10 DIAGNOSIS — I4821 Permanent atrial fibrillation: Secondary | ICD-10-CM

## 2023-08-10 DIAGNOSIS — C3412 Malignant neoplasm of upper lobe, left bronchus or lung: Secondary | ICD-10-CM | POA: Diagnosis not present

## 2023-08-10 LAB — RAD ONC ARIA SESSION SUMMARY
Course Elapsed Days: 30
Plan Fractions Treated to Date: 22
Plan Prescribed Dose Per Fraction: 2 Gy
Plan Total Fractions Prescribed: 35
Plan Total Prescribed Dose: 70 Gy
Reference Point Dosage Given to Date: 44 Gy
Reference Point Session Dosage Given: 2 Gy
Session Number: 22

## 2023-08-10 MED ORDER — FUROSEMIDE 20 MG PO TABS: 20.0000 mg | ORAL_TABLET | Freq: Every day | ORAL | 3 refills | Status: DC

## 2023-08-10 NOTE — Patient Instructions (Signed)
 Medication Instructions:  Your physician recommends that you continue on your current medications as directed. Please refer to the Current Medication list given to you today.   Labwork: None today  Testing/Procedures: None today  Follow-Up: 6 months  Any Other Special Instructions Will Be Listed Below (If Applicable).  If you need a refill on your cardiac medications before your next appointment, please call your pharmacy.

## 2023-08-10 NOTE — Progress Notes (Signed)
 Cardiology Office Note    Date:  08/10/2023  ID:  Raymond Hurst October 12, 1950, MRN [id number] Cardiologist: Jayson Sierras, MD    History of Present Illness:    Raymond Hurst is a 73 y.o. male with past medical history of permanent atrial fibrillation, lymphedema and prior TIA who presents to the office today for 76-month follow-up.  He was examined by Almarie Crate, NP in 05/2023 and he reported having congestion and a productive cough for almost 6 months. He had also lost 40 pounds unintentionally. CXR was obtained and showed a masslike consolidation along the left upper lobe which could represent pneumonia or underlying lung mass. Was treated with IV antibiotics and was recommended obtain a CT scan once he completed his course to rule out underlying neoplasm and also referred to Pulmonology.  In the interim, he was admitted to [facility] in 06/2023 for worsening dyspnea and CT chest at that time did show a left upper lobe mass with invasion of the mediastinum and concerning for bronchogenic carcinoma. Ultimately underwent bronchoscopy later that month and was admitted the day following the procedure as he had dizziness and palpitations. He had held Metoprolol , Lasix  and Eliquis  prior to his procedure and presented in atrial fibrillation with RVR. This was in the setting of hypokalemia as well. Rates improved with correction of electrolytes and he was discharged on Eliquis  5 mg twice daily, Lasix  40 mg daily and Lopressor  25 mg twice daily.  Based off bronchoscopy results, he has been diagnosed with NSCLC and felt to be most consistent with squamous cell carcinoma of the left upper lung. Brain MRI was negative for metastatic disease. He has been undergoing concurrent chemotherapy and radiation.  In talking with the patient today, he reports he has overall been tolerating his treatments well. He continues to lose weight but is trying to consume more protein shakes and says he typically only  eats 1 meal per day. He denies any recent chest pain or palpitations. Overall asymptomatic with his arrhythmia. Does have baseline dyspnea on exertion and says it is typically most notable walking up inclines. No specific orthopnea or PND. Uses his CPAP at night. He has chronic lower extremity edema and only takes Lasix  20 mg daily as he reports he could not tolerate 40 mg daily due to frequent urination.  Studies Reviewed:   EKG: EKG is not ordered today.  Echocardiogram: 05/2019 IMPRESSIONS     1. Left ventricular ejection fraction, by estimation, is 55 to 60%. The  left ventricle has normal function. The left ventricle has no regional  wall motion abnormalities. There is mild left ventricular hypertrophy.  Left ventricular diastolic parameters  are indeterminate.   2. Right ventricular systolic function is normal. The right ventricular  size is normal.   3. The mitral valve is normal in structure. Trivial mitral valve  regurgitation. No evidence of mitral stenosis.   4. The aortic valve has an indeterminant number of cusps. Aortic valve  regurgitation is not visualized. No aortic stenosis is present.   5. Aortic dilatation noted. There is mild to moderate dilatation of the  aortic root measuring 43 mm.   6. Indeterminant PASP, inadequate TR jet.   7. The inferior vena cava is normal in size with greater than 50%  respiratory variability, suggesting right atrial pressure of 3 mmHg.   Risk Assessment/Calculations:    CHA2DS2-VASc Score = 4   This indicates a 4.8% annual risk of stroke. The patient's score is based  upon: CHF History: 0 HTN History: 1 Diabetes History: 0 Stroke History: 2 Vascular Disease History: 0 Age Score: 1 Gender Score: 0    Physical Exam:   VS:  BP 110/60 (BP Location: Left Arm, Cuff Size: Normal)   Pulse 89   Ht 6' (1.829 m)   Wt 275 lb 6.4 oz (124.9 kg)   SpO2 93%   BMI 37.35 kg/m    Wt Readings from Last 3 Encounters:  08/10/23 275 lb 6.4  oz (124.9 kg)  08/08/23 276 lb 3.2 oz (125.3 kg)  08/01/23 285 lb 14.4 oz (129.7 kg)     GEN: Well nourished, well developed male appearing in no acute distress NECK: No JVD; No carotid bruits CARDIAC: Irregularly irregular, no murmurs, rubs, gallops RESPIRATORY:  Clear to auscultation without rales, wheezing or rhonchi  ABDOMEN: Appears non-distended. No obvious abdominal masses. EXTREMITIES: No clubbing or cyanosis. Chronic appearing lymphedema.  Distal pedal pulses are 2+ bilaterally.   Assessment and Plan:   1. Permanent atrial fibrillation (HCC) - He denies any recent palpitations and heart rate is well-controlled in the 80's to 90's during today's visit. Continue Lopressor  25 mg twice daily for rate control.  2. Current use of long term anticoagulation - No reports of melena, hematochezia or hematuria. Continue Eliquis  5 mg twice daily for anticoagulation which is the appropriate dose given his age, weight and renal function.  3. Lymphedema - He has chronic lymphedema and echocardiogram in 2021 showed a preserved EF with no regional wall motion abnormalities. Can obtain a repeat echocardiogram at time of his next visit but will hold off at this time given his very frequent visits due to chemotherapy and radiation treatments. - Continue Lasix  20 mg daily as he reports he could not tolerate 40 mg daily due to frequent urination. Creatinine was stable at 0.60 when checked on 08/08/2023 with K+ at 4.1. He was encouraged to continue to use compression stockings.  4. Malignant neoplasm of upper lobe of left lung (HCC) - He is currently undergoing chemotherapy and radiation.  5. OSA - Continued compliance with CPAP encouraged.   Signed, Laymon CHRISTELLA Qua, PA-C

## 2023-08-11 ENCOUNTER — Ambulatory Visit

## 2023-08-11 ENCOUNTER — Ambulatory Visit (INDEPENDENT_AMBULATORY_CARE_PROVIDER_SITE_OTHER): Admitting: Audiology

## 2023-08-11 ENCOUNTER — Ambulatory Visit: Admitting: Radiation Oncology

## 2023-08-11 ENCOUNTER — Ambulatory Visit (INDEPENDENT_AMBULATORY_CARE_PROVIDER_SITE_OTHER): Admitting: Otolaryngology

## 2023-08-14 ENCOUNTER — Inpatient Hospital Stay (HOSPITAL_BASED_OUTPATIENT_CLINIC_OR_DEPARTMENT_OTHER): Admitting: Oncology

## 2023-08-14 ENCOUNTER — Inpatient Hospital Stay

## 2023-08-14 ENCOUNTER — Ambulatory Visit
Admission: RE | Admit: 2023-08-14 | Discharge: 2023-08-14 | Disposition: A | Discharge status: Home / Self Care | Source: Ambulatory Visit | Attending: Radiation Oncology | Admitting: Radiation Oncology

## 2023-08-14 ENCOUNTER — Ambulatory Visit
Admission: RE | Admit: 2023-08-14 | Discharge: 2023-08-14 | Disposition: A | Discharge status: Home / Self Care | Source: Ambulatory Visit | Attending: Radiation Oncology

## 2023-08-14 ENCOUNTER — Other Ambulatory Visit: Payer: Self-pay

## 2023-08-14 ENCOUNTER — Other Ambulatory Visit

## 2023-08-14 VITALS — BP 103/59 | HR 100 | Temp 98.2°F | Resp 18 | Ht 72.0 in | Wt 275.0 lb

## 2023-08-14 DIAGNOSIS — C3412 Malignant neoplasm of upper lobe, left bronchus or lung: Secondary | ICD-10-CM | POA: Diagnosis not present

## 2023-08-14 DIAGNOSIS — Z87891 Personal history of nicotine dependence: Secondary | ICD-10-CM | POA: Diagnosis not present

## 2023-08-14 DIAGNOSIS — C7492 Malignant neoplasm of unspecified part of left adrenal gland: Secondary | ICD-10-CM | POA: Diagnosis not present

## 2023-08-14 DIAGNOSIS — C7972 Secondary malignant neoplasm of left adrenal gland: Secondary | ICD-10-CM | POA: Diagnosis not present

## 2023-08-14 DIAGNOSIS — C801 Malignant (primary) neoplasm, unspecified: Secondary | ICD-10-CM | POA: Diagnosis not present

## 2023-08-14 DIAGNOSIS — Z510 Encounter for antineoplastic radiation therapy: Secondary | ICD-10-CM | POA: Diagnosis not present

## 2023-08-14 LAB — CBC WITH DIFFERENTIAL (CANCER CENTER ONLY)
Abs Immature Granulocytes: 0.03 K/uL (ref 0.00–0.07)
Basophils Absolute: 0 K/uL (ref 0.0–0.1)
Basophils Relative: 1 %
Eosinophils Absolute: 0.1 K/uL (ref 0.0–0.5)
Eosinophils Relative: 2 %
HCT: 34.9 % — ABNORMAL LOW (ref 39.0–52.0)
Hemoglobin: 11.4 g/dL — ABNORMAL LOW (ref 13.0–17.0)
Immature Granulocytes: 1 %
Lymphocytes Relative: 7 %
Lymphs Abs: 0.3 K/uL — ABNORMAL LOW (ref 0.7–4.0)
MCH: 27.6 pg (ref 26.0–34.0)
MCHC: 32.7 g/dL (ref 30.0–36.0)
MCV: 84.5 fL (ref 80.0–100.0)
Monocytes Absolute: 0.2 K/uL (ref 0.1–1.0)
Monocytes Relative: 4 %
Neutro Abs: 3.2 K/uL (ref 1.7–7.7)
Neutrophils Relative %: 85 %
Platelet Count: 156 K/uL (ref 150–400)
RBC: 4.13 MIL/uL — ABNORMAL LOW (ref 4.22–5.81)
RDW: 18.8 % — ABNORMAL HIGH (ref 11.5–15.5)
WBC Count: 3.8 K/uL — ABNORMAL LOW (ref 4.0–10.5)
nRBC: 0 % (ref 0.0–0.2)

## 2023-08-14 LAB — RAD ONC ARIA SESSION SUMMARY
Course Elapsed Days: 34
Plan Fractions Treated to Date: 23
Plan Fractions Treated to Date: 5
Plan Prescribed Dose Per Fraction: 10 Gy
Plan Prescribed Dose Per Fraction: 2 Gy
Plan Total Fractions Prescribed: 35
Plan Total Fractions Prescribed: 5
Plan Total Prescribed Dose: 50 Gy
Plan Total Prescribed Dose: 70 Gy
Reference Point Dosage Given to Date: 46 Gy
Reference Point Dosage Given to Date: 50 Gy
Reference Point Session Dosage Given: 10 Gy
Reference Point Session Dosage Given: 2 Gy
Session Number: 23

## 2023-08-14 LAB — CMP (CANCER CENTER ONLY)
ALT: 9 U/L (ref 0–44)
AST: 15 U/L (ref 15–41)
Albumin: 3.5 g/dL (ref 3.5–5.0)
Alkaline Phosphatase: 55 U/L (ref 38–126)
Anion gap: 11 (ref 5–15)
BUN: 7 mg/dL — ABNORMAL LOW (ref 8–23)
CO2: 23 mmol/L (ref 22–32)
Calcium: 9.2 mg/dL (ref 8.9–10.3)
Chloride: 101 mmol/L (ref 98–111)
Creatinine: 0.74 mg/dL (ref 0.61–1.24)
GFR, Estimated: >60 mL/min (ref >60)
Glucose, Bld: 137 mg/dL — ABNORMAL HIGH (ref 70–99)
Potassium: 3.7 mmol/L (ref 3.5–5.1)
Sodium: 135 mmol/L (ref 135–145)
Total Bilirubin: 0.6 mg/dL (ref 0.0–1.2)
Total Protein: 6.3 g/dL — ABNORMAL LOW (ref 6.5–8.1)

## 2023-08-14 MED ORDER — FAMOTIDINE IN NACL 20-0.9 MG/50ML-% IV SOLN
20.0000 mg | Freq: Once | INTRAVENOUS | Status: AC
  Administered 2023-08-14: 20 mg via INTRAVENOUS
  Filled 2023-08-14: qty 50

## 2023-08-14 MED ORDER — PALONOSETRON HCL INJECTION 0.25 MG/5ML
0.2500 mg | Freq: Once | INTRAVENOUS | Status: AC
  Administered 2023-08-14: 0.25 mg via INTRAVENOUS
  Filled 2023-08-14: qty 5

## 2023-08-14 MED ORDER — DIPHENHYDRAMINE HCL 50 MG/ML IJ SOLN
25.0000 mg | Freq: Once | INTRAMUSCULAR | Status: AC
  Administered 2023-08-14: 25 mg via INTRAVENOUS
  Filled 2023-08-14: qty 1

## 2023-08-14 MED ORDER — DEXAMETHASONE SODIUM PHOSPHATE 10 MG/ML IJ SOLN
10.0000 mg | Freq: Once | INTRAMUSCULAR | Status: AC
  Administered 2023-08-14: 10 mg via INTRAVENOUS
  Filled 2023-08-14: qty 1

## 2023-08-14 MED ORDER — SODIUM CHLORIDE 0.9 % IV SOLN
INTRAVENOUS | Status: DC
Start: 2023-08-14 — End: 2023-08-14

## 2023-08-14 MED ORDER — SODIUM CHLORIDE 0.9 % IV SOLN
250.0000 mg | Freq: Once | INTRAVENOUS | Status: AC
  Administered 2023-08-14: 250 mg via INTRAVENOUS
  Filled 2023-08-14: qty 25

## 2023-08-14 MED ORDER — SODIUM CHLORIDE 0.9 % IV SOLN
50.0000 mg/m2 | Freq: Once | INTRAVENOUS | Status: AC
  Administered 2023-08-14: 126 mg via INTRAVENOUS
  Filled 2023-08-14: qty 21

## 2023-08-14 NOTE — Progress Notes (Signed)
 Patient seen by Dr. Thornton Papas today  Vitals are within treatment parameters:Yes   Labs are within treatment parameters: Yes   Treatment plan has been signed: Yes   Per physician team, Patient is ready for treatment and there are NO modifications to the treatment plan.

## 2023-08-14 NOTE — Patient Instructions (Signed)
 [facility] - A DEPT OF [facility]  Discharge Instructions: Thank you for choosing [facility] to provide your oncology and hematology care.   If you have a lab appointment with the Cancer Center, please go directly to the Cancer Center and check in at the registration area.   Wear comfortable clothing and clothing appropriate for easy access to any Portacath or PICC line.   We strive to give you quality time with your provider. You may need to reschedule your appointment if you arrive late (15 or more minutes).  Arriving late affects you and other patients whose appointments are after yours.  Also, if you miss three or more appointments without notifying the office, you may be dismissed from the clinic at the provider's discretion.      For prescription refill requests, have your pharmacy contact our office and allow 72 hours for refills to be completed.    Today you received the following chemotherapy and/or immunotherapy agents Taxol  and Carboplatin .  Paclitaxel  Injection What is this medication? PACLITAXEL  (PAK li TAX el) treats some types of cancer. It works by slowing down the growth of cancer cells. This medicine may be used for other purposes; ask your health care provider or pharmacist if you have questions. COMMON BRAND NAME(S): Onxol, Taxol  What should I tell my care team before I take this medication? They need to know if you have any of these conditions: Heart disease Liver disease Low white blood cell levels An unusual or allergic reaction to paclitaxel , other medications, foods, dyes, or preservatives If you or your partner are pregnant or trying to get pregnant Breast-feeding How should I use this medication? This medication is injected into a vein. It is given by your care team in a hospital or clinic setting. Talk to your care team about the use of this medication in children. While it may be given to children for selected  conditions, precautions do apply. Overdosage: If you think you have taken too much of this medicine contact a poison control center or emergency room at once. NOTE: This medicine is only for you. Do not share this medicine with others. What if I miss a dose? Keep appointments for follow-up doses. It is important not to miss your dose. Call your care team if you are unable to keep an appointment. What may interact with this medication? Do not take this medication with any of the following: Live virus vaccines Other medications may affect the way this medication works. Talk with your care team about all of the medications you take. They may suggest changes to your treatment plan to lower the risk of side effects and to make sure your medications work as intended. This list may not describe all possible interactions. Give your health care provider a list of all the medicines, herbs, non-prescription drugs, or dietary supplements you use. Also tell them if you smoke, drink alcohol, or use illegal drugs. Some items may interact with your medicine. What should I watch for while using this medication? Your condition will be monitored carefully while you are receiving this medication. You may need blood work while taking this medication. This medication may make you feel generally unwell. This is not uncommon as chemotherapy can affect healthy cells as well as cancer cells. Report any side effects. Continue your course of treatment even though you feel ill unless your care team tells you to stop. This medication can cause serious allergic reactions. To reduce  the risk, your care team may give you other medications to take before receiving this one. Be sure to follow the directions from your care team. This medication may increase your risk of getting an infection. Call your care team for advice if you get a fever, chills, sore throat, or other symptoms of a cold or flu. Do not treat yourself. Try to avoid  being around people who are sick. This medication may increase your risk to bruise or bleed. Call your care team if you notice any unusual bleeding. Be careful brushing or flossing your teeth or using a toothpick because you may get an infection or bleed more easily. If you have any dental work done, tell your dentist you are receiving this medication. Talk to your care team if you may be pregnant. Serious birth defects can occur if you take this medication during pregnancy. Talk to your care team before breastfeeding. Changes to your treatment plan may be needed. What side effects may I notice from receiving this medication? Side effects that you should report to your care team as soon as possible: Allergic reactions--skin rash, itching, hives, swelling of the face, lips, tongue, or throat Heart rhythm changes--fast or irregular heartbeat, dizziness, feeling faint or lightheaded, chest pain, trouble breathing Increase in blood pressure Infection--fever, chills, cough, sore throat, wounds that don't heal, pain or trouble when passing urine, general feeling of discomfort or being unwell Low blood pressure--dizziness, feeling faint or lightheaded, blurry vision Low red blood cell level--unusual weakness or fatigue, dizziness, headache, trouble breathing Painful swelling, warmth, or redness of the skin, blisters or sores at the infusion site Pain, tingling, or numbness in the hands or feet Slow heartbeat--dizziness, feeling faint or lightheaded, confusion, trouble breathing, unusual weakness or fatigue Unusual bruising or bleeding Side effects that usually do not require medical attention (report to your care team if they continue or are bothersome): Diarrhea Hair loss Joint pain Loss of appetite Muscle pain Nausea Vomiting This list may not describe all possible side effects. Call your doctor for medical advice about side effects. You may report side effects to FDA at 1-800-FDA-1088. Where  should I keep my medication? This medication is given in a hospital or clinic. It will not be stored at home. NOTE: This sheet is a summary. It may not cover all possible information. If you have questions about this medicine, talk to your doctor, pharmacist, or health care provider.  2024 Elsevier/Gold Standard (2021-06-08 00:00:00)  Carboplatin  Injection What is this medication? CARBOPLATIN  (KAR boe pla tin) treats some types of cancer. It works by slowing down the growth of cancer cells. This medicine may be used for other purposes; ask your health care provider or pharmacist if you have questions. COMMON BRAND NAME(S): Paraplatin  What should I tell my care team before I take this medication? They need to know if you have any of these conditions: Blood disorders Hearing problems Kidney disease Recent or ongoing radiation therapy An unusual or allergic reaction to carboplatin , cisplatin, other medications, foods, dyes, or preservatives Pregnant or trying to get pregnant Breast-feeding How should I use this medication? This medication is injected into a vein. It is given by your care team in a hospital or clinic setting. Talk to your care team about the use of this medication in children. Special care may be needed. Overdosage: If you think you have taken too much of this medicine contact a poison control center or emergency room at once. NOTE: This medicine is only for you.  Do not share this medicine with others. What if I miss a dose? Keep appointments for follow-up doses. It is important not to miss your dose. Call your care team if you are unable to keep an appointment. What may interact with this medication? Medications for seizures Some antibiotics, such as amikacin, gentamicin, neomycin, streptomycin, tobramycin Vaccines This list may not describe all possible interactions. Give your health care provider a list of all the medicines, herbs, non-prescription drugs, or dietary  supplements you use. Also tell them if you smoke, drink alcohol, or use illegal drugs. Some items may interact with your medicine. What should I watch for while using this medication? Your condition will be monitored carefully while you are receiving this medication. You may need blood work while taking this medication. This medication may make you feel generally unwell. This is not uncommon, as chemotherapy can affect healthy cells as well as cancer cells. Report any side effects. Continue your course of treatment even though you feel ill unless your care team tells you to stop. In some cases, you may be given additional medications to help with side effects. Follow all directions for their use. This medication may increase your risk of getting an infection. Call your care team for advice if you get a fever, chills, sore throat, or other symptoms of a cold or flu. Do not treat yourself. Try to avoid being around people who are sick. Avoid taking medications that contain aspirin , acetaminophen , ibuprofen, naproxen, or ketoprofen unless instructed by your care team. These medications may hide a fever. Be careful brushing or flossing your teeth or using a toothpick because you may get an infection or bleed more easily. If you have any dental work done, tell your dentist you are receiving this medication. Talk to your care team if you wish to become pregnant or think you might be pregnant. This medication can cause serious birth defects. Talk to your care team about effective forms of contraception. Do not breast-feed while taking this medication. What side effects may I notice from receiving this medication? Side effects that you should report to your care team as soon as possible: Allergic reactions--skin rash, itching, hives, swelling of the face, lips, tongue, or throat Infection--fever, chills, cough, sore throat, wounds that don't heal, pain or trouble when passing urine, general feeling of  discomfort or being unwell Low red blood cell level--unusual weakness or fatigue, dizziness, headache, trouble breathing Pain, tingling, or numbness in the hands or feet, muscle weakness, change in vision, confusion or trouble speaking, loss of balance or coordination, trouble walking, seizures Unusual bruising or bleeding Side effects that usually do not require medical attention (report to your care team if they continue or are bothersome): Hair loss Nausea Unusual weakness or fatigue Vomiting This list may not describe all possible side effects. Call your doctor for medical advice about side effects. You may report side effects to FDA at 1-800-FDA-1088. Where should I keep my medication? This medication is given in a hospital or clinic. It will not be stored at home. NOTE: This sheet is a summary. It may not cover all possible information. If you have questions about this medicine, talk to your doctor, pharmacist, or health care provider.  2024 Elsevier/Gold Standard (2021-05-11 00:00:00)   To help prevent nausea and vomiting after your treatment, we encourage you to take your nausea medication as directed.  BELOW ARE SYMPTOMS THAT SHOULD BE REPORTED IMMEDIATELY: *FEVER GREATER THAN 100.4 F (38 C) OR HIGHER *CHILLS OR SWEATING *  NAUSEA AND VOMITING THAT IS NOT CONTROLLED WITH YOUR NAUSEA MEDICATION *UNUSUAL SHORTNESS OF BREATH *UNUSUAL BRUISING OR BLEEDING *URINARY PROBLEMS (pain or burning when urinating, or frequent urination) *BOWEL PROBLEMS (unusual diarrhea, constipation, pain near the anus) TENDERNESS IN MOUTH AND THROAT WITH OR WITHOUT PRESENCE OF ULCERS (sore throat, sores in mouth, or a toothache) UNUSUAL RASH, SWELLING OR PAIN  UNUSUAL VAGINAL DISCHARGE OR ITCHING   Items with * indicate a potential emergency and should be followed up as soon as possible or go to the Emergency Department if any problems should occur.  Please show the CHEMOTHERAPY ALERT CARD or  IMMUNOTHERAPY ALERT CARD at check-in to the Emergency Department and triage nurse.  Should you have questions after your visit or need to cancel or reschedule your appointment, please contact [facility] - A DEPT OF [facility]  Dept: [phone]  and follow the prompts.  Office hours are 8:00 a.m. to 4:30 p.m. Monday - Friday. Please note that voicemails left after 4:00 p.m. may not be returned until the following business day.  We are closed weekends and major holidays. You have access to a nurse at all times for urgent questions. Please call the main number to the clinic Dept: [phone] and follow the prompts.   For any non-urgent questions, you may also contact your provider using MyChart. We now offer e-Visits for anyone 32 and older to request care online for non-urgent symptoms. For details visit [email or web address].   Also download the MyChart app! Go to the app store, search MyChart, open the app, select [place], and log in with your MyChart username and password.

## 2023-08-14 NOTE — Progress Notes (Signed)
 [facility] OFFICE PROGRESS NOTE   Diagnosis: Non-small cell lung cancer  INTERVAL HISTORY:   Raymond Hurst returns as scheduled.  He completed another treatment with Taxol /carboplatin  on 08/08/2023.  No nausea/vomiting or neuropathy symptoms.  No new complaint.  He reports wheezing is improved with Xopenex .  Objective:  Vital signs in last 24 hours:  Blood pressure (!) 103/59, pulse 100, temperature 98.2 F (36.8 C), temperature source Temporal, resp. rate 18, height 6' (1.829 m), weight 275 lb (124.7 kg), SpO2 96%.    HEENT: No thrush or ulcers Resp: Good air movement bilaterally, bilateral end expiratory wheeze, no respiratory distress Cardio: Irregular GI: Nontender, no hepatosplenomegaly Vascular: No leg edema bilaterally   Lab Results:  Lab Results  Component Value Date   WBC 3.8 (L) 08/14/2023   HGB 11.4 (L) 08/14/2023   HCT 34.9 (L) 08/14/2023   MCV 84.5 08/14/2023   PLT 156 08/14/2023   NEUTROABS 3.2 08/14/2023    CMP  Lab Results  Component Value Date   NA 135 08/08/2023   K 4.1 08/08/2023   CL 102 08/08/2023   CO2 23 08/08/2023   GLUCOSE 104 (H) 08/08/2023   BUN 7 (L) 08/08/2023   CREATININE 0.60 (L) 08/08/2023   CALCIUM  9.0 08/08/2023   PROT 6.2 (L) 08/08/2023   ALBUMIN 3.3 (L) 08/08/2023   AST 11 (L) 08/08/2023   ALT 9 08/08/2023   ALKPHOS 56 08/08/2023   BILITOT 0.5 08/08/2023   GFRNONAA >60 08/08/2023   GFRAA >60 06/13/2019   Medications: I have reviewed the patient's current medications.   Assessment/Plan:  Left lung mass CT chest 06/05/2023: Left upper lobe mass with invasion of the mediastinum and effacement of the left upper lobe and lower lobe pulmonary arteries, moderate left pleural effusion, small mediastinal lymph nodes, right adrenal mass favoring myolipoma, probable benign lucent lesion at the right scapula Diagnostic left thoracentesis 06/06/2023: Cytology-no malignant cells identified Bronchoscopy with endobronchial  ultrasound and biopsies 06/12/2023-lateral left mainstem bronchial irregularity extending into the left upper lobe airways narrowing the left upper lobe airways, no discrete endobronchial lesion.  Endobronchial ultrasound showed a left hilar mass with significant vascularity; enlargement at station 4L, 11L, 7.  Station 7, 4L and 11L lymph nodes negative for malignancy; left hilar mass/lymph node metastatic carcinoma; left upper lobe endobronchial brushing malignant cells present; left upper lobe endobronchial biopsy suspicious for malignancy; weak focal p63 positivity in the tumor cells, negative for TTF-1, synaptophysin and CD56, a poorly differentiated squamous cell carcinoma is favored; PD-L1 pending; foundation 1-HRD signature/microsatellite status/tumor mutation burden cannot be determined, IDH1 [id number]. PET scan 06/20/2023-large left hilar mass primarily in the left upper lobe.  Left adrenal gland with hypermetabolic activity suspicious for early metastatic lesion. Brain MRI 06/30/2023-negative for metastatic disease Week 1 Taxol /carboplatin  07/10/2023 Radiation chest 07/11/2023; plan for simultaneous treatment left adrenal metastasis with 5 fractions of SBRT Week 2 Taxol /carboplatin  07/17/2023 Week 3 Taxol /carboplatin  07/24/2023 Week 4 Taxol /carboplatin  08/01/2023 Week 5 Taxol /carboplatin  08/08/2023 Week 6 Taxol /carboplatin  08/14/2023   2.  Hoarseness/cough secondary to #1-improved following vocal cord procedure 06/29/2023, Restylane injection into bilateral lateral thyroid  arytenoid muscles 07/31/2023 3.  Atrial fibrillation 4.  Lower extremity edema 5.  Alcohol use 6.  Large external hemorrhoid on exam 07/17/2023      Disposition: Raymond Hurst appears stable.  He continues to tolerate the paclitaxel /carboplatin  well.  He will complete another cycle today.  He is scheduled to complete radiation on 08/30/2023.  He will return for a final planned treatment  with paclitaxel /carboplatin  in 1 week.  We have unable  to obtain PD-L1 testing on the bronchoscopy tissue.  We sent the material to 2 companies.  I will contact Dr. Shelah to discuss a repeat bronchoscopy.  Raymond Hof, MD  08/14/2023  8:34 AM

## 2023-08-14 NOTE — Progress Notes (Signed)
 [facility] CSW Progress Note  Clinical Child psychotherapist contacted patient by phone to follow-up on [employer].    Interventions: CSW confirmed documents have been received and have been submitted.       Follow Up Plan:  CSW will contact patient once status on assistance is known.    Lizbeth Sprague, LCSW Clinical Social Worker [facility]

## 2023-08-15 ENCOUNTER — Telehealth: Payer: Self-pay | Admitting: Dietician

## 2023-08-15 ENCOUNTER — Inpatient Hospital Stay: Admitting: Dietician

## 2023-08-15 ENCOUNTER — Ambulatory Visit
Admission: RE | Admit: 2023-08-15 | Discharge: 2023-08-15 | Disposition: A | Discharge status: Home / Self Care | Source: Ambulatory Visit | Attending: Radiation Oncology

## 2023-08-15 ENCOUNTER — Ambulatory Visit
Admission: RE | Admit: 2023-08-15 | Discharge: 2023-08-15 | Disposition: A | Discharge status: Home / Self Care | Source: Ambulatory Visit | Attending: Radiation Oncology | Admitting: Radiation Oncology

## 2023-08-15 ENCOUNTER — Other Ambulatory Visit: Payer: Self-pay

## 2023-08-15 DIAGNOSIS — Z87891 Personal history of nicotine dependence: Secondary | ICD-10-CM | POA: Diagnosis not present

## 2023-08-15 DIAGNOSIS — C3412 Malignant neoplasm of upper lobe, left bronchus or lung: Secondary | ICD-10-CM

## 2023-08-15 DIAGNOSIS — C801 Malignant (primary) neoplasm, unspecified: Secondary | ICD-10-CM | POA: Diagnosis not present

## 2023-08-15 DIAGNOSIS — C7492 Malignant neoplasm of unspecified part of left adrenal gland: Secondary | ICD-10-CM | POA: Diagnosis not present

## 2023-08-15 DIAGNOSIS — Z510 Encounter for antineoplastic radiation therapy: Secondary | ICD-10-CM | POA: Diagnosis not present

## 2023-08-15 DIAGNOSIS — C7972 Secondary malignant neoplasm of left adrenal gland: Secondary | ICD-10-CM | POA: Diagnosis not present

## 2023-08-15 LAB — RAD ONC ARIA SESSION SUMMARY
Course Elapsed Days: 35
Plan Fractions Treated to Date: 24
Plan Prescribed Dose Per Fraction: 2 Gy
Plan Total Fractions Prescribed: 35
Plan Total Prescribed Dose: 70 Gy
Reference Point Dosage Given to Date: 48 Gy
Reference Point Session Dosage Given: 2 Gy
Session Number: 24

## 2023-08-15 NOTE — Telephone Encounter (Signed)
 Attempted to reach patient via telephone for scheduled nutrition appointment. RD called wife ([name]) as noted in contacts. Left VM with request for return call. Contact information provided.

## 2023-08-16 ENCOUNTER — Ambulatory Visit
Admission: RE | Admit: 2023-08-16 | Discharge: 2023-08-16 | Disposition: A | Discharge status: Home / Self Care | Source: Ambulatory Visit | Attending: Radiation Oncology | Admitting: Radiation Oncology

## 2023-08-16 ENCOUNTER — Other Ambulatory Visit: Payer: Self-pay

## 2023-08-16 DIAGNOSIS — C3412 Malignant neoplasm of upper lobe, left bronchus or lung: Secondary | ICD-10-CM | POA: Diagnosis not present

## 2023-08-16 DIAGNOSIS — Z87891 Personal history of nicotine dependence: Secondary | ICD-10-CM | POA: Diagnosis not present

## 2023-08-16 DIAGNOSIS — Z510 Encounter for antineoplastic radiation therapy: Secondary | ICD-10-CM | POA: Diagnosis not present

## 2023-08-16 DIAGNOSIS — C7972 Secondary malignant neoplasm of left adrenal gland: Secondary | ICD-10-CM | POA: Diagnosis not present

## 2023-08-16 DIAGNOSIS — C801 Malignant (primary) neoplasm, unspecified: Secondary | ICD-10-CM | POA: Diagnosis not present

## 2023-08-16 LAB — RAD ONC ARIA SESSION SUMMARY
Course Elapsed Days: 36
Plan Fractions Treated to Date: 1
Plan Prescribed Dose Per Fraction: 2 Gy
Plan Total Fractions Prescribed: 11
Plan Total Prescribed Dose: 22 Gy
Reference Point Dosage Given to Date: 2 Gy
Reference Point Session Dosage Given: 2 Gy
Session Number: 25

## 2023-08-16 NOTE — Progress Notes (Signed)
  Radiation Oncology         [phone] ________________________________  Name: LEMMIE VANLANEN MRN: [id number]  Date: 08/15/2023  DOB: 1950-02-07  SIMULATION NOTE    ICD-10-CM   1. Malignant neoplasm of upper lobe of left lung (HCC)  C34.12       DIAGNOSIS:  73 yo man with squamous cell carcinoma of the LUL lung and solitary oligometastatic deposit to the left adrenal gland  NARRATIVE:  The patient was brought to the CT Simulation planning suite because his cone beam CT imaging during radiation treatment have shown dramatic anatomic changes with aeration of previously obstructed lung and associated mediastinal shift.  So, new updated CT imaging is needed for more accurate planning.    Identity was confirmed.  All relevant records and images related to the planned course of therapy were reviewed.  The patient was set-up in a stable reproducible  supine position for radiation therapy.  CT images were obtained.  Surface markings were placed.  The CT images were loaded into the planning software.  PET fusion was performed.  Then the updated target and avoidance structures were contoured.  Treatment planning then occurred.  The radiation prescription was confirmed.  Then, I designed and supervised the construction of multiple medically necessary complex treatment devices, including a BodyFix immobilization mold custom fitted to the patient along with multileaf collimators conformally shaped radiation around the treatment target while shielding critical structures such as the heart and spinal cord maximally.  I have requested : 3D Simulation  I have requested a DVH of the following structures: Left lung, right lung, spinal cord, heart, esophagus, and target.  I have ordered:Nutrition Consult  PLAN:  The patient will continue his planned course to 70 Gy in 35 fractions.  ________________________________  Donnice FELIX Patrcia, M.D.

## 2023-08-17 ENCOUNTER — Telehealth: Payer: Self-pay | Admitting: *Deleted

## 2023-08-17 ENCOUNTER — Telehealth: Payer: Self-pay | Admitting: Emergency Medicine

## 2023-08-17 ENCOUNTER — Ambulatory Visit
Admission: RE | Admit: 2023-08-17 | Discharge: 2023-08-17 | Disposition: A | Discharge status: Home / Self Care | Source: Ambulatory Visit | Attending: Radiation Oncology

## 2023-08-17 ENCOUNTER — Ambulatory Visit

## 2023-08-17 ENCOUNTER — Other Ambulatory Visit: Payer: Self-pay

## 2023-08-17 ENCOUNTER — Encounter: Payer: Self-pay | Admitting: Emergency Medicine

## 2023-08-17 DIAGNOSIS — Z87891 Personal history of nicotine dependence: Secondary | ICD-10-CM | POA: Diagnosis not present

## 2023-08-17 DIAGNOSIS — C801 Malignant (primary) neoplasm, unspecified: Secondary | ICD-10-CM | POA: Diagnosis not present

## 2023-08-17 DIAGNOSIS — Z510 Encounter for antineoplastic radiation therapy: Secondary | ICD-10-CM | POA: Diagnosis not present

## 2023-08-17 DIAGNOSIS — C7972 Secondary malignant neoplasm of left adrenal gland: Secondary | ICD-10-CM | POA: Diagnosis not present

## 2023-08-17 DIAGNOSIS — C3412 Malignant neoplasm of upper lobe, left bronchus or lung: Secondary | ICD-10-CM | POA: Diagnosis not present

## 2023-08-17 DIAGNOSIS — C7492 Malignant neoplasm of unspecified part of left adrenal gland: Secondary | ICD-10-CM | POA: Diagnosis not present

## 2023-08-17 LAB — RAD ONC ARIA SESSION SUMMARY
Course Elapsed Days: 37
Plan Fractions Treated to Date: 2
Plan Prescribed Dose Per Fraction: 2 Gy
Plan Total Fractions Prescribed: 11
Plan Total Prescribed Dose: 22 Gy
Reference Point Dosage Given to Date: 4 Gy
Reference Point Session Dosage Given: 2 Gy
Session Number: 26

## 2023-08-17 NOTE — Telephone Encounter (Signed)
 Left detailed message for the patient with my number and mailed letter

## 2023-08-17 NOTE — Telephone Encounter (Signed)
 Patient scheduled I will call the patient Case [phone] [id number] will send to [facility] to get auth

## 2023-08-17 NOTE — Telephone Encounter (Signed)
 Informed Mrs. Alabi that he is scheduled for CT chest at [facility] 7/26 at 6pm and bronchoscopy is scheduled for 7/28. She should be getting call from pulmonary with specific instructions.

## 2023-08-17 NOTE — Telephone Encounter (Signed)
 Please schedule the following:  Provider performing procedure: Vuk Skillern Diagnosis: Left upper lobe mass Which side if for nodule / mass?  Left Procedure: Robotic assisted navigational bronchoscopy Has patient been spoken to by Provider and given informed consent?  Yes by Dr. Cloretta Anesthesia: General Do you need Fluro?  Yes Duration of procedure: 30 minutes Date: 08/28/2023 Alternate Date: 08/29/2023 Time: any Location: [facility] Does patient have OSA?  Yes DM?  No or Latex allergy?  No Medication Restriction/ Anticoagulate/Antiplatelet: Patient  needs to stop Eliquis  2 days prior Pre-op Labs Ordered:determined by Anesthesia Imaging request: Needs a new super D CT chest which will be ordered today 7/17  (If, SuperDimension CT Chest, please have STAT courier sent to ENDO)

## 2023-08-18 ENCOUNTER — Ambulatory Visit

## 2023-08-19 ENCOUNTER — Other Ambulatory Visit: Payer: Self-pay | Admitting: Oncology

## 2023-08-21 ENCOUNTER — Other Ambulatory Visit: Payer: Self-pay

## 2023-08-21 ENCOUNTER — Ambulatory Visit
Admission: RE | Admit: 2023-08-21 | Discharge: 2023-08-21 | Disposition: A | Discharge status: Home / Self Care | Source: Ambulatory Visit | Attending: Radiation Oncology | Admitting: Radiation Oncology

## 2023-08-21 DIAGNOSIS — C3412 Malignant neoplasm of upper lobe, left bronchus or lung: Secondary | ICD-10-CM | POA: Diagnosis not present

## 2023-08-21 DIAGNOSIS — C7972 Secondary malignant neoplasm of left adrenal gland: Secondary | ICD-10-CM | POA: Diagnosis not present

## 2023-08-21 DIAGNOSIS — C801 Malignant (primary) neoplasm, unspecified: Secondary | ICD-10-CM | POA: Diagnosis not present

## 2023-08-21 LAB — RAD ONC ARIA SESSION SUMMARY
Course Elapsed Days: 41
Plan Fractions Treated to Date: 3
Plan Prescribed Dose Per Fraction: 2 Gy
Plan Total Fractions Prescribed: 11
Plan Total Prescribed Dose: 22 Gy
Reference Point Dosage Given to Date: 6 Gy
Reference Point Session Dosage Given: 2 Gy
Session Number: 27

## 2023-08-22 ENCOUNTER — Ambulatory Visit (INDEPENDENT_AMBULATORY_CARE_PROVIDER_SITE_OTHER): Admitting: Otolaryngology

## 2023-08-22 ENCOUNTER — Other Ambulatory Visit: Payer: Self-pay

## 2023-08-22 ENCOUNTER — Ambulatory Visit
Admission: RE | Admit: 2023-08-22 | Discharge: 2023-08-22 | Discharge status: Home / Self Care | Source: Ambulatory Visit | Attending: Radiation Oncology

## 2023-08-22 VITALS — BP 103/60 | HR 81

## 2023-08-22 DIAGNOSIS — C3410 Malignant neoplasm of upper lobe, unspecified bronchus or lung: Secondary | ICD-10-CM

## 2023-08-22 DIAGNOSIS — C3412 Malignant neoplasm of upper lobe, left bronchus or lung: Secondary | ICD-10-CM | POA: Diagnosis not present

## 2023-08-22 DIAGNOSIS — R490 Dysphonia: Secondary | ICD-10-CM | POA: Diagnosis not present

## 2023-08-22 DIAGNOSIS — I4891 Unspecified atrial fibrillation: Secondary | ICD-10-CM

## 2023-08-22 DIAGNOSIS — J383 Other diseases of vocal cords: Secondary | ICD-10-CM

## 2023-08-22 DIAGNOSIS — Z87891 Personal history of nicotine dependence: Secondary | ICD-10-CM

## 2023-08-22 DIAGNOSIS — C7972 Secondary malignant neoplasm of left adrenal gland: Secondary | ICD-10-CM | POA: Diagnosis not present

## 2023-08-22 DIAGNOSIS — J3800 Paralysis of vocal cords and larynx, unspecified: Secondary | ICD-10-CM | POA: Diagnosis not present

## 2023-08-22 DIAGNOSIS — C801 Malignant (primary) neoplasm, unspecified: Secondary | ICD-10-CM | POA: Diagnosis not present

## 2023-08-22 LAB — RAD ONC ARIA SESSION SUMMARY
Course Elapsed Days: 42
Plan Fractions Treated to Date: 4
Plan Prescribed Dose Per Fraction: 2 Gy
Plan Total Fractions Prescribed: 11
Plan Total Prescribed Dose: 22 Gy
Reference Point Dosage Given to Date: 8 Gy
Reference Point Session Dosage Given: 2 Gy
Session Number: 28

## 2023-08-22 NOTE — Progress Notes (Signed)
 ENT Progress Note:  Update 08/22/23 Raymond Hurst is a 73 year old male undergoing treatment for vocal cord issues who presents for follow-up after vocal fold injection augmentation   He reports that after the second injection his voice is strong. No voice loss.   He is currently undergoing chemotherapy and has his last session scheduled for tomorrow.  Records Reviewed:  Initial Evaluation  Reason for Consult: dysphonia, hx of left lung cancer and vocal fold paralysis    HPI: Discussed the use of AI scribe software for clinical note transcription with the patient, who gave verbal consent to proceed.  History of Present Illness Raymond Hurst is a 73 year old male with hx of lung cancer who presents for evaluation   He has been experiencing dysphonia, diagnosed recently following a hospital admission. Symptoms began approximately three weeks ago with an 'off' voice, initially thought to be laryngitis. Despite antibiotic treatment, his condition worsened, leading to breathlessness when speaking.  He has a history of a lung mass, diagnosed as squamous cell carcinoma, confirmed by bronchoscopy and biopsy. He is awaiting molecular testing results. The cancer has metastasized to the lymph nodes in the chest.  He has a history of smoking, having quit 30 years ago. He also has a history of arrhythmia and is on blood thinners. He possibly had a TIA in the past. No significant issues with swallowing, though occasionally experiences difficulty when drinking water too quickly. A swallow study conducted a couple of weeks ago showed no aspiration (FEES in the hospital).  He uses an inhaler and hydrocodone  cough syrup for symptom management, particularly at night or when not driving. He also drinks hot teas and honey to alleviate symptoms.    Discharge Summary 06/13/23 - 06/14/23 73 year old male with past medical history of alcohol abuse, lymphedema, permanent atrial fibrillation, A-fib on Eliquis ,  gout, GI bleed, CVA. Presented with dizziness and palpitation post bronchoscopy Patient apparently had a bronchoscopy for evaluation of lung mass around 9 AM on yesterday 06/12/2023 Later in the evening he progressed to have dizziness, and palpitation.. He apparently was prescribed metoprolol , Lasix  and Eliquis  which she was told to hold for the procedure.  He invertedly took 1 dose of Eliquis  p.m. glucose 151, BNP 130 Hemoglobin 11.9, WBC 7.3, platelets 284.  Sodium 137, potassium 3.3, bicarbonate 24, serum creatinine 0.83.  EKG showed atrial fibrillation with RVR and nonspecific ST changes.  Troponin 6>>7 Lactic acid 1.2.  INR 1.3.  CT of the chest showed stable left hilar mass radiating to the pleural margin.  There is no PE or dissection.  Lung Mass - S/p Bronchoscopy with Biopsy with Pulmonology yesterday - Follow up outpatient with Pulmonology   #Tobacco Use Disorder - Cessation discussed   # Alcohol abuse -no signs of withdraw   #Chronic Lymphedema - TED hose - Hold home Lasix  in setting of Hypotension - restart lasix  after d/c   #morbid Obesity Body mass index is 40.33 kg/m.   Past Medical History:  Diagnosis Date   Adrenal tumor    right adrenal gland tumor favored to be a myelolipoma   Atrial fibrillation (HCC)    Cellulitis of right lower leg    Dysrhythmia    A-fib   History of TIA (transient ischemic attack)    May 2020   Lymphedema    OSA on CPAP    severe obstructive sleep apnea with an AHI of 54/h with no significant central events.  He had nocturnal hypoxemia  with O2 sat nadir of 70%. Now on CPAP   Pulmonary mass    Stroke [facility])     Past Surgical History:  Procedure Laterality Date   BRONCHIAL BIOPSY  06/12/2023   Procedure: BRONCHOSCOPY, WITH BIOPSY;  Surgeon: Shelah Lamar RAMAN, MD;  Location: [facility] ENDOSCOPY;  Service: Cardiopulmonary;;   BRONCHIAL BRUSHINGS  06/12/2023   Procedure: BRONCHOSCOPY, WITH BRUSH BIOPSY;  Surgeon: Shelah Lamar RAMAN, MD;  Location:  [facility] ENDOSCOPY;  Service: Cardiopulmonary;;   BRONCHIAL NEEDLE ASPIRATION BIOPSY  06/12/2023   Procedure: BRONCHOSCOPY, WITH NEEDLE ASPIRATION BIOPSY;  Surgeon: Shelah Lamar RAMAN, MD;  Location: [facility] ENDOSCOPY;  Service: Cardiopulmonary;;   HEMOSTASIS CLIP PLACEMENT  06/12/2023   Procedure: CONTROL OF HEMORRHAGE bronch cold saline and epi;  Surgeon: Shelah Lamar RAMAN, MD;  Location: [facility] ENDOSCOPY;  Service: Cardiopulmonary;;   INNER EAR SURGERY     KNEE SURGERY Left    THORACENTESIS Left 06/06/2023   Procedure: THORACENTESIS;  Surgeon: Arlinda Ranks, MD;  Location: [facility] ENDOSCOPY;  Service: Pulmonary;  Laterality: Left;   TONSILLECTOMY     VIDEO BRONCHOSCOPY WITH ENDOBRONCHIAL ULTRASOUND Left 06/12/2023   Procedure: BRONCHOSCOPY, WITH EBUS;  Surgeon: Shelah Lamar RAMAN, MD;  Location: [facility] ENDOSCOPY;  Service: Cardiopulmonary;  Laterality: Left;    Family History  Problem Relation Age of Onset   Heart failure Mother    COPD Mother    Atrial fibrillation Mother    Idiopathic pulmonary fibrosis Father     Social History:  reports that he quit smoking about 33 years ago. His smoking use included cigarettes. He started smoking about 53 years ago. He has a 40 pack-year smoking history. He has never used smokeless tobacco. He reports current alcohol use. He reports that he does not use drugs.  Allergies: No Known Allergies  Medications: I have reviewed the patient's current medications.  The PMH, PSH, Medications, Allergies, and SH were reviewed and updated.  ROS: Constitutional: Negative for fever, weight loss and weight gain. Cardiovascular: Negative for chest pain and dyspnea on exertion. Respiratory: Is not experiencing shortness of breath at rest. Gastrointestinal: Negative for nausea and vomiting. Neurological: Negative for headaches. Psychiatric: The patient is not nervous/anxious  There were no vitals taken for this visit. There is no height or weight on file to calculate BMI.  PHYSICAL  EXAM:  Exam: General: Well-developed, well-nourished Communication and Voice: much louder, good projection mild raspy quality  Respiratory Respiratory effort: Equal inspiration and expiration without stridor Cardiovascular Peripheral Vascular: Warm extremities with equal color/perfusion Eyes: No nystagmus with equal extraocular motion bilaterally Neuro/Psych/Balance: Patient oriented to person, place, and time; Appropriate mood and affect; Gait is intact with no imbalance; Cranial nerves I-XII are intact Head and Face Inspection: Normocephalic and atraumatic without mass or lesion Palpation: Facial skeleton intact without bony stepoffs Salivary Glands: No mass or tenderness Facial Strength: Facial motility symmetric and full bilaterally ENT Pinna: External ear intact and fully developed External canal: Canal is patent with intact skin Tympanic Membrane: Clear and mobile External Nose: No scar or anatomic deformity Internal Nose: Septum is S-shaped. No polyp, or purulence. Mucosal edema and erythema present.  Bilateral inferior turbinate hypertrophy.  Lips, Teeth, and gums: Mucosa and teeth intact and viable TMJ: No pain to palpation with full mobility Oral cavity/oropharynx: No erythema or exudate, no lesions present Nasopharynx: No mass or lesion with intact mucosa Hypopharynx: Intact mucosa without pooling of secretions Larynx Glottic: L VF paralysis on the left, s/p injection augmentation, edges of VF straight with good  glottic closure  Supraglottic: Normal appearing epiglottis and AE folds Interarytenoid Space: Moderate pachydermia&edema Subglottic Space: Patent without lesion or edema Neck Neck and Trachea: Midline trachea without mass or lesion Thyroid : No mass or nodularity Lymphatics: No lymphadenopathy  Procedure:  Preoperative diagnosis: hoarseness L VF paralysis s/p injection augmentation x 2   Postoperative diagnosis:   same + good closure mucosal wave  intact  Procedure: Flexible fiberoptic laryngoscopy with stroboscopy (68420)   Surgeon: Elena Larry, MD  Anesthesia: Topical lidocaine  and Afrin  Complications: None  Condition is stable throughout exam  Indications and consent:   The patient presents to the clinic with hoarseness. All the risks, benefits, and potential complications were reviewed with the patient preoperatively and informed verbal consent was obtained.  Procedure: The patient was seated upright in the exam chair.   Topical lidocaine  and Afrin were applied to the nasal cavity. After adequate anesthesia had occurred, the flexible telescope with strobe capabilities was passed into the nasal cavity. The nasopharynx was patent without mass or lesion. The scope was passed behind the soft palate and directed toward the base of tongue. The base of tongue was visualized and was symmetric with no apparent masses or abnormal appearing tissue. There were no signs of a mass or pooling of secretions in the piriform sinuses. The supraglottic structures were normal.  The true vocal cords with normal mobility on the right and L VF paralysis on the left. The medial edges were straight. Closure was complete. Periodicity present. The mucosal wave and amplitude were normal and symmetric. There is moderate interarytenoid pachydermia and post cricoid edema. The mucosa appears without erythema.   The laryngoscope was then slowly withdrawn and the patient tolerated the procedure well. There were no complications or blood loss.    Studies Reviewed: CT chest 06/13/23 IMPRESSION: Stable appearing left hilar mass radiating to the pleural margin. The overall appearance is similar to that seen on the prior exam. Associated pleural effusion is noted.  PET CT 06/2023 IMPRESSION: 1. Large (11.1 cm in long axis) left hilar mass primarily in the left upper lobe extends from the AP window all the way to the left upper lobe pleural margin and abuts the  major fissure with associated hypermetabolic activity, compatible with malignancy. 2. Thickening of the genu of the left adrenal gland demonstrates hypermetabolic activity with maximum SUV 6.1 suspicious for early metastatic lesion of the left adrenal gland. The contralateral (right) adrenal gland demonstrates a fatty lesion favoring benign right myelolipoma. 3. Mildly loculated left pleural fluid effusion indeterminate for malignancy. 4. Progressive consolidation or atelectasis in the left upper lobe. Increased ground-glass opacity and volume loss in the left lower lobe. 5. Small left hilar lymph nodes are not pathologically enlarged but have maximum SUV 2.4, below blood pool levels. A subcarinal node adjacent to the esophagus is not pathologically enlarged but has a maximum SUV of 4.2, mildly above blood pool, this could be reactive or neoplastic. 6. Mildly enlarged right inguinal lymph nodes are enlarged but not hypermetabolic. 7. Symmetric soft palate activity with maximum SUV 7.2 on the right and 8.1 on the left, without specific associated CT abnormality. This is most likely physiologic. Similarly there is a small amount of activity along the muscular tissues of the right anterior tongue with maximum SUV 5.6, and no CT correlate, most likely physiologic. 8. Chronic L1 compression fracture. 9.  Aortic Atherosclerosis (ICD10-I70.0).   FEES with speech 06/07/2023 Pt presents with grossly normal swallow function.  There was no penetration  or aspiration of any consistencies trialed, and there was minimal pharyngeal residue.  OF NOTE: Pt's left vocal fold is entirely immobile.  This may be due to RLN palsy from compression from mass.  There appears to be some extra flaccid tissue at apex of L arytenoid which flops into the laryngeal vestibule at times partially obscuring view of vocal cords.  Pt would benefit from ENT involvement in care. 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  Discussed with pt slow rate, small sips.  He reports some coughing with drinking, but none observed today.  Encourage use of straw as this was the condition for his FEES and no penetration was observed during this study.  Pt would benefit from ENT/SLP OP follow up.         Assessment/Plan: No diagnosis found.    Assessment and Plan Assessment & Plan Left vocal cord paralysis and breathy voice dysphonia  Left vocal cord paralysis due to lung mass SCCa compressing the recurrent laryngeal nerve. Spontaneous resolution unlikely. Vocal cord medialization with injection augmentation proposed to improve phonation and decrease risk of aspiration/improve cough strength. Procedure may require more gel due to paralyzed cord position and vocal cord atrophy. Gel effect lasts 6-12 months, repeat procedures possible. Risks include bruising and potential need for general anesthesia if not able to do under local in the office (he had A-E overhang partially obstructing the view of the glottis. He would like to proceed - Schedule for in office Restylane injection augmentation  - Provided procedure instructions, including transportation and post-procedure care. - Continue anticoagulation, monitor for bruising post-procedure  - Discuss potential need for repeat gel injection if symptoms persist and if only partially medialized after 1st one with residual glottic insufficiency.  Squamous cell carcinoma of lung Squamous cell carcinoma in upper lobe with lymph node involvement, have not initiated treatment. Awaiting molecular testing to finalize treatment plan, likely radiation and chemotherapy followed by immunotherapy. Surgical resection not planned due to tumor location and critical structure involvement.  Atrial fibrillation Atrial fibrillation managed with anticoagulation to prevent thromboembolic events. - on Eliquis      Update 08/22/2023  Assessment & Plan L VF paralysis and VF atrophy, glottic insufficiency  S/p injection augmentation x 2 with good outcome based on voice quality and scope today - we discussed that this would last 6-12 months  - will consider injection augmentation vs thyroplasty after repeat exam May 2026 - RTC in 8 months or sooner if voice changes   Lung cancer - continue with current treatment and f/u with Dr Shelah Carder   Elena Larry, MD Otolaryngology [facility] Phone: [phone] Fax: [phone]    08/22/2023, 2:44 PM

## 2023-08-23 ENCOUNTER — Inpatient Hospital Stay (HOSPITAL_BASED_OUTPATIENT_CLINIC_OR_DEPARTMENT_OTHER): Admitting: Oncology

## 2023-08-23 ENCOUNTER — Inpatient Hospital Stay

## 2023-08-23 ENCOUNTER — Other Ambulatory Visit: Payer: Self-pay

## 2023-08-23 ENCOUNTER — Ambulatory Visit
Admission: RE | Admit: 2023-08-23 | Discharge: 2023-08-23 | Disposition: A | Discharge status: Home / Self Care | Source: Ambulatory Visit | Attending: Radiation Oncology | Admitting: Radiation Oncology

## 2023-08-23 ENCOUNTER — Encounter: Payer: Self-pay | Admitting: Oncology

## 2023-08-23 ENCOUNTER — Other Ambulatory Visit

## 2023-08-23 VITALS — BP 111/66 | HR 105 | Temp 98.3°F | Resp 18 | Ht 72.0 in | Wt 265.3 lb

## 2023-08-23 VITALS — BP 135/87 | HR 89 | Temp 98.3°F | Resp 18 | Ht 72.0 in | Wt 265.3 lb

## 2023-08-23 DIAGNOSIS — C801 Malignant (primary) neoplasm, unspecified: Secondary | ICD-10-CM | POA: Diagnosis not present

## 2023-08-23 DIAGNOSIS — C7972 Secondary malignant neoplasm of left adrenal gland: Secondary | ICD-10-CM | POA: Diagnosis not present

## 2023-08-23 DIAGNOSIS — C3412 Malignant neoplasm of upper lobe, left bronchus or lung: Secondary | ICD-10-CM

## 2023-08-23 LAB — CBC WITH DIFFERENTIAL (CANCER CENTER ONLY)
Abs Immature Granulocytes: 0.01 K/uL (ref 0.00–0.07)
Basophils Absolute: 0 K/uL (ref 0.0–0.1)
Basophils Relative: 1 %
Eosinophils Absolute: 0 K/uL (ref 0.0–0.5)
Eosinophils Relative: 2 %
HCT: 32.6 % — ABNORMAL LOW (ref 39.0–52.0)
Hemoglobin: 10.9 g/dL — ABNORMAL LOW (ref 13.0–17.0)
Immature Granulocytes: 0 %
Lymphocytes Relative: 6 %
Lymphs Abs: 0.1 K/uL — ABNORMAL LOW (ref 0.7–4.0)
MCH: 28.5 pg (ref 26.0–34.0)
MCHC: 33.4 g/dL (ref 30.0–36.0)
MCV: 85.1 fL (ref 80.0–100.0)
Monocytes Absolute: 0.3 K/uL (ref 0.1–1.0)
Monocytes Relative: 13 %
Neutro Abs: 1.8 K/uL (ref 1.7–7.7)
Neutrophils Relative %: 78 %
Platelet Count: 130 K/uL — ABNORMAL LOW (ref 150–400)
RBC: 3.83 MIL/uL — ABNORMAL LOW (ref 4.22–5.81)
RDW: 20.4 % — ABNORMAL HIGH (ref 11.5–15.5)
WBC Count: 2.3 K/uL — ABNORMAL LOW (ref 4.0–10.5)
nRBC: 0 % (ref 0.0–0.2)

## 2023-08-23 LAB — CMP (CANCER CENTER ONLY)
ALT: 9 U/L (ref 0–44)
AST: 13 U/L — ABNORMAL LOW (ref 15–41)
Albumin: 3.4 g/dL — ABNORMAL LOW (ref 3.5–5.0)
Alkaline Phosphatase: 56 U/L (ref 38–126)
Anion gap: 11 (ref 5–15)
BUN: 8 mg/dL (ref 8–23)
CO2: 23 mmol/L (ref 22–32)
Calcium: 9.3 mg/dL (ref 8.9–10.3)
Chloride: 103 mmol/L (ref 98–111)
Creatinine: 0.66 mg/dL (ref 0.61–1.24)
GFR, Estimated: >60 mL/min (ref >60)
Glucose, Bld: 101 mg/dL — ABNORMAL HIGH (ref 70–99)
Potassium: 3.7 mmol/L (ref 3.5–5.1)
Sodium: 137 mmol/L (ref 135–145)
Total Bilirubin: 0.5 mg/dL (ref 0.0–1.2)
Total Protein: 6.4 g/dL — ABNORMAL LOW (ref 6.5–8.1)

## 2023-08-23 LAB — RAD ONC ARIA SESSION SUMMARY
Course Elapsed Days: 43
Plan Fractions Treated to Date: 5
Plan Prescribed Dose Per Fraction: 2 Gy
Plan Total Fractions Prescribed: 11
Plan Total Prescribed Dose: 22 Gy
Reference Point Dosage Given to Date: 10 Gy
Reference Point Session Dosage Given: 2 Gy
Session Number: 29

## 2023-08-23 MED ORDER — FAMOTIDINE IN NACL 20-0.9 MG/50ML-% IV SOLN
20.0000 mg | Freq: Once | INTRAVENOUS | Status: AC
  Administered 2023-08-23: 20 mg via INTRAVENOUS
  Filled 2023-08-23: qty 50

## 2023-08-23 MED ORDER — DEXAMETHASONE SODIUM PHOSPHATE 10 MG/ML IJ SOLN
10.0000 mg | Freq: Once | INTRAMUSCULAR | Status: AC
  Administered 2023-08-23: 10 mg via INTRAVENOUS
  Filled 2023-08-23: qty 1

## 2023-08-23 MED ORDER — PALONOSETRON HCL INJECTION 0.25 MG/5ML
0.2500 mg | Freq: Once | INTRAVENOUS | Status: AC
  Administered 2023-08-23: 0.25 mg via INTRAVENOUS
  Filled 2023-08-23: qty 5

## 2023-08-23 MED ORDER — SODIUM CHLORIDE 0.9 % IV SOLN
50.0000 mg/m2 | Freq: Once | INTRAVENOUS | Status: AC
  Administered 2023-08-23: 126 mg via INTRAVENOUS
  Filled 2023-08-23: qty 21

## 2023-08-23 MED ORDER — SODIUM CHLORIDE 0.9 % IV SOLN: INTRAVENOUS | Status: DC

## 2023-08-23 MED ORDER — DIPHENHYDRAMINE HCL 50 MG/ML IJ SOLN
25.0000 mg | Freq: Once | INTRAMUSCULAR | Status: AC
  Administered 2023-08-23: 25 mg via INTRAVENOUS
  Filled 2023-08-23: qty 1

## 2023-08-23 MED ORDER — SODIUM CHLORIDE 0.9 % IV SOLN
250.0000 mg | Freq: Once | INTRAVENOUS | Status: AC
  Administered 2023-08-23: 250 mg via INTRAVENOUS
  Filled 2023-08-23: qty 25

## 2023-08-23 NOTE — Patient Instructions (Signed)
 [facility] - A DEPT OF [facility]  Discharge Instructions: Thank you for choosing [facility] to provide your oncology and hematology care.   If you have a lab appointment with the Cancer Center, please go directly to the Cancer Center and check in at the registration area.   Wear comfortable clothing and clothing appropriate for easy access to any Portacath or PICC line.   We strive to give you quality time with your provider. You may need to reschedule your appointment if you arrive late (15 or more minutes).  Arriving late affects you and other patients whose appointments are after yours.  Also, if you miss three or more appointments without notifying the office, you may be dismissed from the clinic at the provider's discretion.      For prescription refill requests, have your pharmacy contact our office and allow 72 hours for refills to be completed.    Today you received the following chemotherapy and/or immunotherapy agents Taxol  and Carboplatin .  Paclitaxel  Injection What is this medication? PACLITAXEL  (PAK li TAX el) treats some types of cancer. It works by slowing down the growth of cancer cells. This medicine may be used for other purposes; ask your health care provider or pharmacist if you have questions. COMMON BRAND NAME(S): Onxol, Taxol  What should I tell my care team before I take this medication? They need to know if you have any of these conditions: Heart disease Liver disease Low white blood cell levels An unusual or allergic reaction to paclitaxel , other medications, foods, dyes, or preservatives If you or your partner are pregnant or trying to get pregnant Breast-feeding How should I use this medication? This medication is injected into a vein. It is given by your care team in a hospital or clinic setting. Talk to your care team about the use of this medication in children. While it may be given to children for selected  conditions, precautions do apply. Overdosage: If you think you have taken too much of this medicine contact a poison control center or emergency room at once. NOTE: This medicine is only for you. Do not share this medicine with others. What if I miss a dose? Keep appointments for follow-up doses. It is important not to miss your dose. Call your care team if you are unable to keep an appointment. What may interact with this medication? Do not take this medication with any of the following: Live virus vaccines Other medications may affect the way this medication works. Talk with your care team about all of the medications you take. They may suggest changes to your treatment plan to lower the risk of side effects and to make sure your medications work as intended. This list may not describe all possible interactions. Give your health care provider a list of all the medicines, herbs, non-prescription drugs, or dietary supplements you use. Also tell them if you smoke, drink alcohol, or use illegal drugs. Some items may interact with your medicine. What should I watch for while using this medication? Your condition will be monitored carefully while you are receiving this medication. You may need blood work while taking this medication. This medication may make you feel generally unwell. This is not uncommon as chemotherapy can affect healthy cells as well as cancer cells. Report any side effects. Continue your course of treatment even though you feel ill unless your care team tells you to stop. This medication can cause serious allergic reactions. To reduce  the risk, your care team may give you other medications to take before receiving this one. Be sure to follow the directions from your care team. This medication may increase your risk of getting an infection. Call your care team for advice if you get a fever, chills, sore throat, or other symptoms of a cold or flu. Do not treat yourself. Try to avoid  being around people who are sick. This medication may increase your risk to bruise or bleed. Call your care team if you notice any unusual bleeding. Be careful brushing or flossing your teeth or using a toothpick because you may get an infection or bleed more easily. If you have any dental work done, tell your dentist you are receiving this medication. Talk to your care team if you may be pregnant. Serious birth defects can occur if you take this medication during pregnancy. Talk to your care team before breastfeeding. Changes to your treatment plan may be needed. What side effects may I notice from receiving this medication? Side effects that you should report to your care team as soon as possible: Allergic reactions--skin rash, itching, hives, swelling of the face, lips, tongue, or throat Heart rhythm changes--fast or irregular heartbeat, dizziness, feeling faint or lightheaded, chest pain, trouble breathing Increase in blood pressure Infection--fever, chills, cough, sore throat, wounds that don't heal, pain or trouble when passing urine, general feeling of discomfort or being unwell Low blood pressure--dizziness, feeling faint or lightheaded, blurry vision Low red blood cell level--unusual weakness or fatigue, dizziness, headache, trouble breathing Painful swelling, warmth, or redness of the skin, blisters or sores at the infusion site Pain, tingling, or numbness in the hands or feet Slow heartbeat--dizziness, feeling faint or lightheaded, confusion, trouble breathing, unusual weakness or fatigue Unusual bruising or bleeding Side effects that usually do not require medical attention (report to your care team if they continue or are bothersome): Diarrhea Hair loss Joint pain Loss of appetite Muscle pain Nausea Vomiting This list may not describe all possible side effects. Call your doctor for medical advice about side effects. You may report side effects to FDA at 1-800-FDA-1088. Where  should I keep my medication? This medication is given in a hospital or clinic. It will not be stored at home. NOTE: This sheet is a summary. It may not cover all possible information. If you have questions about this medicine, talk to your doctor, pharmacist, or health care provider.  2024 Elsevier/Gold Standard (2021-06-08 00:00:00)  Carboplatin  Injection What is this medication? CARBOPLATIN  (KAR boe pla tin) treats some types of cancer. It works by slowing down the growth of cancer cells. This medicine may be used for other purposes; ask your health care provider or pharmacist if you have questions. COMMON BRAND NAME(S): Paraplatin  What should I tell my care team before I take this medication? They need to know if you have any of these conditions: Blood disorders Hearing problems Kidney disease Recent or ongoing radiation therapy An unusual or allergic reaction to carboplatin , cisplatin, other medications, foods, dyes, or preservatives Pregnant or trying to get pregnant Breast-feeding How should I use this medication? This medication is injected into a vein. It is given by your care team in a hospital or clinic setting. Talk to your care team about the use of this medication in children. Special care may be needed. Overdosage: If you think you have taken too much of this medicine contact a poison control center or emergency room at once. NOTE: This medicine is only for you.  Do not share this medicine with others. What if I miss a dose? Keep appointments for follow-up doses. It is important not to miss your dose. Call your care team if you are unable to keep an appointment. What may interact with this medication? Medications for seizures Some antibiotics, such as amikacin, gentamicin, neomycin, streptomycin, tobramycin Vaccines This list may not describe all possible interactions. Give your health care provider a list of all the medicines, herbs, non-prescription drugs, or dietary  supplements you use. Also tell them if you smoke, drink alcohol, or use illegal drugs. Some items may interact with your medicine. What should I watch for while using this medication? Your condition will be monitored carefully while you are receiving this medication. You may need blood work while taking this medication. This medication may make you feel generally unwell. This is not uncommon, as chemotherapy can affect healthy cells as well as cancer cells. Report any side effects. Continue your course of treatment even though you feel ill unless your care team tells you to stop. In some cases, you may be given additional medications to help with side effects. Follow all directions for their use. This medication may increase your risk of getting an infection. Call your care team for advice if you get a fever, chills, sore throat, or other symptoms of a cold or flu. Do not treat yourself. Try to avoid being around people who are sick. Avoid taking medications that contain aspirin , acetaminophen , ibuprofen, naproxen, or ketoprofen unless instructed by your care team. These medications may hide a fever. Be careful brushing or flossing your teeth or using a toothpick because you may get an infection or bleed more easily. If you have any dental work done, tell your dentist you are receiving this medication. Talk to your care team if you wish to become pregnant or think you might be pregnant. This medication can cause serious birth defects. Talk to your care team about effective forms of contraception. Do not breast-feed while taking this medication. What side effects may I notice from receiving this medication? Side effects that you should report to your care team as soon as possible: Allergic reactions--skin rash, itching, hives, swelling of the face, lips, tongue, or throat Infection--fever, chills, cough, sore throat, wounds that don't heal, pain or trouble when passing urine, general feeling of  discomfort or being unwell Low red blood cell level--unusual weakness or fatigue, dizziness, headache, trouble breathing Pain, tingling, or numbness in the hands or feet, muscle weakness, change in vision, confusion or trouble speaking, loss of balance or coordination, trouble walking, seizures Unusual bruising or bleeding Side effects that usually do not require medical attention (report to your care team if they continue or are bothersome): Hair loss Nausea Unusual weakness or fatigue Vomiting This list may not describe all possible side effects. Call your doctor for medical advice about side effects. You may report side effects to FDA at 1-800-FDA-1088. Where should I keep my medication? This medication is given in a hospital or clinic. It will not be stored at home. NOTE: This sheet is a summary. It may not cover all possible information. If you have questions about this medicine, talk to your doctor, pharmacist, or health care provider.  2024 Elsevier/Gold Standard (2021-05-11 00:00:00)   To help prevent nausea and vomiting after your treatment, we encourage you to take your nausea medication as directed.  BELOW ARE SYMPTOMS THAT SHOULD BE REPORTED IMMEDIATELY: *FEVER GREATER THAN 100.4 F (38 C) OR HIGHER *CHILLS OR SWEATING *  NAUSEA AND VOMITING THAT IS NOT CONTROLLED WITH YOUR NAUSEA MEDICATION *UNUSUAL SHORTNESS OF BREATH *UNUSUAL BRUISING OR BLEEDING *URINARY PROBLEMS (pain or burning when urinating, or frequent urination) *BOWEL PROBLEMS (unusual diarrhea, constipation, pain near the anus) TENDERNESS IN MOUTH AND THROAT WITH OR WITHOUT PRESENCE OF ULCERS (sore throat, sores in mouth, or a toothache) UNUSUAL RASH, SWELLING OR PAIN  UNUSUAL VAGINAL DISCHARGE OR ITCHING   Items with * indicate a potential emergency and should be followed up as soon as possible or go to the Emergency Department if any problems should occur.  Please show the CHEMOTHERAPY ALERT CARD or  IMMUNOTHERAPY ALERT CARD at check-in to the Emergency Department and triage nurse.  Should you have questions after your visit or need to cancel or reschedule your appointment, please contact [facility] - A DEPT OF [facility]  Dept: [phone]  and follow the prompts.  Office hours are 8:00 a.m. to 4:30 p.m. Monday - Friday. Please note that voicemails left after 4:00 p.m. may not be returned until the following business day.  We are closed weekends and major holidays. You have access to a nurse at all times for urgent questions. Please call the main number to the clinic Dept: [phone] and follow the prompts.   For any non-urgent questions, you may also contact your provider using MyChart. We now offer e-Visits for anyone 32 and older to request care online for non-urgent symptoms. For details visit [email or web address].   Also download the MyChart app! Go to the app store, search MyChart, open the app, select [place], and log in with your MyChart username and password.

## 2023-08-23 NOTE — Progress Notes (Signed)
 [facility] OFFICE PROGRESS NOTE   Diagnosis: Non-small cell lung cancer  INTERVAL HISTORY:   Mr. Duckett returns as scheduled.  He completed another treatment with paclitaxel /carboplatin  on 08/14/2023.  No nausea or neuropathy symptoms.  He reports altered taste and a decreased appetite over the past week.  He has mild dizziness today.  He missed a dose of metoprolol  yesterday.  Objective:  Vital signs in last 24 hours:  Blood pressure 111/66, pulse (!) 105, temperature 98.3 F (36.8 C), resp. rate 18, height 6' (1.829 m), weight 265 lb 4.8 oz (120.3 kg), SpO2 97%.    HEENT: No thrush or ulcers Resp: Mild bilateral expiratory wheeze, good air movement bilaterally, no respiratory distress Cardio: Irregular GI: No hepatosplenomegaly Vascular: Bilateral lower leg edema   Lab Results:  Lab Results  Component Value Date   WBC 2.3 (L) 08/23/2023   HGB 10.9 (L) 08/23/2023   HCT 32.6 (L) 08/23/2023   MCV 85.1 08/23/2023   PLT PENDING 08/23/2023   NEUTROABS 1.8 08/23/2023    CMP  Lab Results  Component Value Date   NA 137 08/23/2023   K 3.7 08/23/2023   CL 103 08/23/2023   CO2 23 08/23/2023   GLUCOSE 101 (H) 08/23/2023   BUN 8 08/23/2023   CREATININE 0.66 08/23/2023   CALCIUM  9.3 08/23/2023   PROT 6.4 (L) 08/23/2023   ALBUMIN 3.4 (L) 08/23/2023   AST 13 (L) 08/23/2023   ALT 9 08/23/2023   ALKPHOS 56 08/23/2023   BILITOT 0.5 08/23/2023   GFRNONAA >60 08/23/2023   GFRAA >60 06/13/2019    Medications: I have reviewed the patient's current medications.   Assessment/Plan: Left lung mass CT chest 06/05/2023: Left upper lobe mass with invasion of the mediastinum and effacement of the left upper lobe and lower lobe pulmonary arteries, moderate left pleural effusion, small mediastinal lymph nodes, right adrenal mass favoring myolipoma, probable benign lucent lesion at the right scapula Diagnostic left thoracentesis 06/06/2023: Cytology-no malignant cells  identified Bronchoscopy with endobronchial ultrasound and biopsies 06/12/2023-lateral left mainstem bronchial irregularity extending into the left upper lobe airways narrowing the left upper lobe airways, no discrete endobronchial lesion.  Endobronchial ultrasound showed a left hilar mass with significant vascularity; enlargement at station 4L, 11L, 7.  Station 7, 4L and 11L lymph nodes negative for malignancy; left hilar mass/lymph node metastatic carcinoma; left upper lobe endobronchial brushing malignant cells present; left upper lobe endobronchial biopsy suspicious for malignancy; weak focal p63 positivity in the tumor cells, negative for TTF-1, synaptophysin and CD56, a poorly differentiated squamous cell carcinoma is favored; PD-L1 pending; foundation 1-HRD signature/microsatellite status/tumor mutation burden cannot be determined, IDH1 [id number]. PET scan 06/20/2023-large left hilar mass primarily in the left upper lobe.  Left adrenal gland with hypermetabolic activity suspicious for early metastatic lesion. Brain MRI 06/30/2023-negative for metastatic disease Week 1 Taxol /carboplatin  07/10/2023 Radiation chest 07/11/2023; plan for simultaneous treatment left adrenal metastasis with 5 fractions of SBRT Week 2 Taxol /carboplatin  07/17/2023 Week 3 Taxol /carboplatin  07/24/2023 Week 4 Taxol /carboplatin  08/01/2023 Week 5 Taxol /carboplatin  08/08/2023 Week 6 Taxol /carboplatin  08/14/2023 Week 7 Taxol /carboplatin  08/23/2023   2.  Hoarseness/cough secondary to #1-improved following vocal cord procedure 06/29/2023, Restylane injection into bilateral lateral thyroid  arytenoid muscles 07/31/2023 3.  Atrial fibrillation 4.  Lower extremity edema 5.  Alcohol use 6.  Large external hemorrhoid on exam 07/17/2023       Disposition: Mr. Eustice continues concurrent weekly paclitaxel /carboplatin  and radiation.  He is tolerating the treatment well.  I encouraged him to increase  his food intake.  He will complete a final treatment  with paclitaxel /carboplatin  today.  He will complete radiation 09/01/2023.  He is scheduled for a chest CT 08/26/2023.  Dr. Shelah plans a bronchoscopy next week to obtain additional tissue for PD-1 testing. Mr. Mahnke will have a CBC at [facility] on 08/31/2023.  He will return for an office visit on 09/11/2023.  We will discuss indication for immunotherapy when he is here on 09/11/2023.  I discussed the case with his wife by telephone last week.  The white count and platelets are mildly low, but adequate to proceed with chemotherapy today.  Arley Hof, MD  08/23/2023  12:00 PM

## 2023-08-23 NOTE — Progress Notes (Signed)
 Pulse on repeat was 100 per Kellie.

## 2023-08-23 NOTE — Progress Notes (Signed)
 Patient seen by Dr. Thornton Papas today  Vitals are within treatment parameters:Yes   Labs are within treatment parameters: Yes   Treatment plan has been signed: Yes   Per physician team, Patient is ready for treatment and there are NO modifications to the treatment plan.

## 2023-08-23 NOTE — Addendum Note (Signed)
 Addended by: CLORETTA KUBA B on: 08/23/2023 02:45 PM   Modules accepted: Level of Service

## 2023-08-24 ENCOUNTER — Other Ambulatory Visit: Payer: Self-pay

## 2023-08-24 ENCOUNTER — Ambulatory Visit
Admission: RE | Admit: 2023-08-24 | Discharge: 2023-08-24 | Disposition: A | Discharge status: Home / Self Care | Source: Ambulatory Visit | Attending: Radiation Oncology | Admitting: Radiation Oncology

## 2023-08-24 ENCOUNTER — Ambulatory Visit

## 2023-08-24 ENCOUNTER — Ambulatory Visit
Admission: RE | Admit: 2023-08-24 | Discharge: 2023-08-24 | Disposition: A | Discharge status: Home / Self Care | Source: Ambulatory Visit | Attending: Radiation Oncology

## 2023-08-24 ENCOUNTER — Telehealth: Payer: Self-pay | Admitting: *Deleted

## 2023-08-24 DIAGNOSIS — C801 Malignant (primary) neoplasm, unspecified: Secondary | ICD-10-CM | POA: Diagnosis not present

## 2023-08-24 DIAGNOSIS — C7972 Secondary malignant neoplasm of left adrenal gland: Secondary | ICD-10-CM | POA: Diagnosis not present

## 2023-08-24 DIAGNOSIS — C3412 Malignant neoplasm of upper lobe, left bronchus or lung: Secondary | ICD-10-CM | POA: Diagnosis not present

## 2023-08-24 LAB — RAD ONC ARIA SESSION SUMMARY
Course Elapsed Days: 44
Plan Fractions Treated to Date: 6
Plan Prescribed Dose Per Fraction: 2 Gy
Plan Total Fractions Prescribed: 11
Plan Total Prescribed Dose: 22 Gy
Reference Point Dosage Given to Date: 12 Gy
Reference Point Session Dosage Given: 2 Gy
Session Number: 30

## 2023-08-24 NOTE — Telephone Encounter (Signed)
 Notified by [employer] that there was insufficient tissue for testing. Asking for guidance. Called back and left VM that he is having a repeat bronchoscopy on 7/28 at [facility] and they can reach out to pathology department for additional tissue for testing.

## 2023-08-25 ENCOUNTER — Other Ambulatory Visit: Payer: Self-pay

## 2023-08-25 ENCOUNTER — Encounter (HOSPITAL_COMMUNITY): Payer: Self-pay | Admitting: Emergency Medicine

## 2023-08-25 ENCOUNTER — Ambulatory Visit

## 2023-08-25 ENCOUNTER — Ambulatory Visit
Admission: RE | Admit: 2023-08-25 | Discharge: 2023-08-25 | Disposition: A | Discharge status: Home / Self Care | Source: Ambulatory Visit | Attending: Radiation Oncology | Admitting: Radiation Oncology

## 2023-08-25 DIAGNOSIS — C7972 Secondary malignant neoplasm of left adrenal gland: Secondary | ICD-10-CM | POA: Diagnosis not present

## 2023-08-25 DIAGNOSIS — C3412 Malignant neoplasm of upper lobe, left bronchus or lung: Secondary | ICD-10-CM | POA: Diagnosis not present

## 2023-08-25 DIAGNOSIS — Z510 Encounter for antineoplastic radiation therapy: Secondary | ICD-10-CM | POA: Diagnosis not present

## 2023-08-25 DIAGNOSIS — J90 Pleural effusion, not elsewhere classified: Secondary | ICD-10-CM | POA: Diagnosis not present

## 2023-08-25 DIAGNOSIS — C7492 Malignant neoplasm of unspecified part of left adrenal gland: Secondary | ICD-10-CM | POA: Diagnosis not present

## 2023-08-25 DIAGNOSIS — C801 Malignant (primary) neoplasm, unspecified: Secondary | ICD-10-CM | POA: Diagnosis not present

## 2023-08-25 DIAGNOSIS — Z87891 Personal history of nicotine dependence: Secondary | ICD-10-CM | POA: Diagnosis not present

## 2023-08-25 LAB — RAD ONC ARIA SESSION SUMMARY
Course Elapsed Days: 45
Plan Fractions Treated to Date: 7
Plan Prescribed Dose Per Fraction: 2 Gy
Plan Total Fractions Prescribed: 11
Plan Total Prescribed Dose: 22 Gy
Reference Point Dosage Given to Date: 14 Gy
Reference Point Session Dosage Given: 2 Gy
Session Number: 31

## 2023-08-25 NOTE — Progress Notes (Signed)
 Anesthesia Chart Review: SAME DAY WORK-UP  Case: [id number] Date/Time: 08/28/23 1430   Procedure: VIDEO BRONCHOSCOPY WITH ENDOBRONCHIAL NAVIGATION (Left)   Anesthesia type: General   Diagnosis: Malignant neoplasm of upper lobe of left lung (HCC) [C34.12]   Pre-op diagnosis: left upper lobe mass   [facility] / [facility]   Surgeons: Shelah Lamar RAMAN, MD       DISCUSSION: Patient is a 73 year old male scheduled for the above procedure. S/p Video fiberoptic bronchoscopy with endobronchial ultrasound and biopsies on 06/12/2023. LUL lung biopsy with malignant cells with left hilar FNA + metastatic carcinoma--poorly differentiated squamous cell carcinoma favored.  He was readmitted the next day overnight for afib with RVR (known afb) and hypotension requiring brief Levophed  with improvement in BP. HR improved after resuming metoprolol . Imaging did not suggest pneumothorax, PE, or dissection and clinical picture not consistent with Septic, Cardiogenic, or Hypovolemic shock. He has undergone simultaneous SBRT to chest and hypermetabolic left adrenal lesion and concurrent paclitaxel /carboplatin , last dose 08/23/23. He will complete radiation on 09/01/23. A repeat bronchoscopy was recommended to obtain additional tissue for PD-1 testing.    Other history includes former smoker (quit 1992), TIA (2020), permanent afib, leg edema/lymphedema, severe OSA (uses CPAP), right adrenal tumor (favored to be myelolipoma with hypermetabolic left adrenal lesion, s/p SBRT), LUL lung/hilar malignancy (SCC favored, s/p SBRT, chemotherapy), dysphonia/left vocal cord paralysis (due to lung mass SCCa compressing the recurrent laryngeal nerve, s/p laryngoscopy with Restylane injection 06/29/23 & 07/31/23).  Alcohol use is documented as 4-5 beers daily.   Last office visit with cardiology was on 08/10/23 with Strader, Brittany, PA-C. Overall tolerating cancer treatments. Denied recent chest pains or palpitations.  Known DOE, most notable with inclines. Denied orthopnea or PND. Using CPAP. Afib overall rate controlled on Lopressor  25 mg BID. Tolerating Eliquis . Taking Lasix  20 mg daily for LE edema because he could not tolerate 40 mg due to frequent urination. Last echo in 2021 showed preserved EF with no RWMA. Consider updating echo in the future, but she held off due to him already having frequent visits for chemoradiation. Encouraged to continue compression stockings. Six month follow-up planned.   Dr. Shelah advised holding Eliquis  for 2 days prior to procedure.    He is a same-day workup, so anesthesia team to evaluate on the day of surgery.  He did have a CMP and CBC done on 08/23/23 (per Dr. Deanne, prechemo). Results include sodium 137, potassium 3.7, glucose 101, creatinine 0.66, AST 13, ALT 9, WBC 2.3, hemoglobin 10.9, hematocrit 32.6, platelet count 130K.   VS:  Wt Readings from Last 3 Encounters:  08/23/23 120.3 kg  08/23/23 120.3 kg  08/14/23 124.7 kg   BP Readings from Last 3 Encounters:  08/23/23 135/87  08/23/23 111/66  08/22/23 103/60   Pulse Readings from Last 3 Encounters:  08/23/23 89  08/23/23 (!) 105  08/22/23 81     PROVIDERS: [facility] Shelah Lamar, MD is pulmonologist Debera Savant, MD is cardiologist Cloretta Arley NOVAK, MD is HEM-ONC Patrcia Cough, MD is RAD-ONC Okey Burns, MD is ENT Rubin Calamity, MD is general surgeon. Evalauted 08/18/22. Referred him to endocrinologist Faythe Purchase, MD for right adrenal mass and follow-up with surgery if needed.   LABS: Most recent lab results in [facility] include: Lab Results  Component Value Date   WBC 2.3 (L) 08/23/2023   HGB 10.9 (L) 08/23/2023   HCT 32.6 (L) 08/23/2023   PLT 130 (L) 08/23/2023  GLUCOSE 101 (H) 08/23/2023   ALT 9 08/23/2023   AST 13 (L) 08/23/2023   NA 137 08/23/2023   K 3.7 08/23/2023   CL 103 08/23/2023   CREATININE 0.66 08/23/2023   BUN 8 08/23/2023   CO2 23  08/23/2023   INR 1.3 (H) 06/14/2023   Plasma Metanephrine and Normetanephrine were within normal range by labs in 10/2022 [facility]).   IMAGES: MRI Brain 06/30/23: IMPRESSION: No evidence of intracranial metastatic disease. No acute intracranial abnormality. Similar chronic microhemorrhage in the right cerebellum. Mild parenchymal volume loss.  PET Scan 06/20/23: IMPRESSION: 1. Large (11.1 cm in long axis) left hilar mass primarily in the left upper lobe extends from the AP window all the way to the left upper lobe pleural margin and abuts the major fissure with associated hypermetabolic activity, compatible with malignancy. 2. Thickening of the genu of the left adrenal gland demonstrates hypermetabolic activity with maximum SUV 6.1 suspicious for early metastatic lesion of the left adrenal gland. The contralateral (right) adrenal gland demonstrates a fatty lesion favoring benign right myelolipoma. 3. Mildly loculated left pleural fluid effusion indeterminate for malignancy. 4. Progressive consolidation or atelectasis in the left upper lobe. Increased ground-glass opacity and volume loss in the left lower lobe. 5. Small left hilar lymph nodes are not pathologically enlarged but have maximum SUV 2.4, below blood pool levels. A subcarinal node adjacent to the esophagus is not pathologically enlarged but has a maximum SUV of 4.2, mildly above blood pool, this could be reactive or neoplastic. 6. Mildly enlarged right inguinal lymph nodes are enlarged but not hypermetabolic. 7. Symmetric soft palate activity with maximum SUV 7.2 on the right and 8.1 on the left, without specific associated CT abnormality. This is most likely physiologic. Similarly there is a small amount of activity along the muscular tissues of the right anterior tongue with maximum SUV 5.6, and no CT correlate, most likely physiologic. 8. Chronic L1 compression fracture. 9.  Aortic Atherosclerosis  (ICD10-I70.0).  CT Chest 06/13/23: MPRESSION: Stable appearing left hilar mass radiating to the pleural margin. The overall appearance is similar to that seen on the prior exam. Associated pleural effusion is noted. - No definitive pulmonary emboli or aortic dissection is noted.   CT Abd 04/18/23: IMPRESSION: *Right adrenal gland myelolipoma.  Stable since prior examination. *Bilateral renal cysts. *Aortic atherosclerosis.     EKG:  EKG 06/13/22: Atrial fibrillation at 114 bpm Left anterior fascicular block Abnormal R-wave progression, late transition Borderline prolonged QT interval Confirmed by Melvenia Motto [phone]) on 06/13/2023 3:45:36 AM  EKG 06/05/23: Atrial fibrillation at 83 bpm Left axis deviation Borderline prolonged QT interval Confirmed by Yolande Charleston [phone]) on 06/05/2023 3:05:34 PM     CV: Echo 05/06/19: IMPRESSIONS   1. Left ventricular ejection fraction, by estimation, is 55 to 60%. The  left ventricle has normal function. The left ventricle has no regional  wall motion abnormalities. There is mild left ventricular hypertrophy.  Left ventricular diastolic parameters  are indeterminate.   2. Right ventricular systolic function is normal. The right ventricular  size is normal.   3. The mitral valve is normal in structure. Trivial mitral valve  regurgitation. No evidence of mitral stenosis.   4. The aortic valve has an indeterminant number of cusps. Aortic valve  regurgitation is not visualized. No aortic stenosis is present.   5. Aortic dilatation noted. There is mild to moderate dilatation of the  aortic root measuring 43 mm.   6. Indeterminant PASP, inadequate TR  jet.   7. The inferior vena cava is normal in size with greater than 50%  respiratory variability, suggesting right atrial pressure of 3 mmHg.    US  Carotid 06/08/18: IMPRESSION: Color duplex indicates minimal heterogeneous plaque, with no hemodynamically significant stenosis by duplex criteria in  the extracranial cerebrovascular circulation.   Past Medical History:  Diagnosis Date   Adrenal tumor    right adrenal gland tumor favored to be a myelolipoma   Atrial fibrillation (HCC)    Cellulitis of right lower leg    Dysrhythmia    A-fib   History of TIA (transient ischemic attack)    May 2020   Lymphedema    OSA on CPAP    severe obstructive sleep apnea with an AHI of 54/h with no significant central events.  He had nocturnal hypoxemia with O2 sat nadir of 70%. Now on CPAP   Pulmonary mass    Stroke [facility])     Past Surgical History:  Procedure Laterality Date   BRONCHIAL BIOPSY  06/12/2023   Procedure: BRONCHOSCOPY, WITH BIOPSY;  Surgeon: Shelah Lamar RAMAN, MD;  Location: [facility] ENDOSCOPY;  Service: Cardiopulmonary;;   BRONCHIAL BRUSHINGS  06/12/2023   Procedure: BRONCHOSCOPY, WITH BRUSH BIOPSY;  Surgeon: Shelah Lamar RAMAN, MD;  Location: [facility] ENDOS[facility];  Service: Cardiopulmonary;;   BRONCHIAL NEEDLE ASPIRATION BIOPSY  06/12/2023   Procedure: BRONCHOSCOPY, WITH NEEDLE ASPIRATION BIOPSY;  Surgeon: Shelah Lamar RAMAN, MD;  Location: [facility] ENDOSCOPY;  Service: Cardiopulmonary;;   HEMOSTASIS CLIP PLACEMENT  06/12/2023   Procedure: CONTROL OF HEMORRHAGE bronch cold saline and epi;  Surgeon: Shelah Lamar RAMAN, MD;  Location: [facility] ENDOSCOPY;  Service: Cardiopulmonary;;   INNER EAR SURGERY     KNEE SURGERY Left    THORACENTESIS Left 06/06/2023   Procedure: THORACENTESIS;  Surgeon: Arlinda Ranks, MD;  Location: [facility] ENDOSCOPY;  Service: Pulmonary;  Laterality: Left;   TONSILLECTOMY     VIDEO BRONCHOSCOPY WITH ENDOBRONCHIAL ULTRASOUND Left 06/12/2023   Procedure: BRONCHOSCOPY, WITH EBUS;  Surgeon: Shelah Lamar RAMAN, MD;  Location[facility] ENDOSCOPY;  Service: Cardiopulmonary;  Laterality: Left;    MEDICATIONS: No current facility-administered medications for this encounter.    acetaminophen  (TYLENOL ) 500 MG tablet   allopurinol  (ZYLOPRIM ) 100 MG tablet   apixaban  (ELIQUIS ) 5 MG TABS tablet    budesonide-glycopyrrolate-formoterol (BREZTRI  AEROSPHERE) 160-9-4.8 MCG/ACT AERO inhaler   cholecalciferol  (VITAMIN D3) 25 MCG (1000 UNIT) tablet   cyanocobalamin  (VITAMIN B12) 100 MCG tablet   furosemide  (LASIX ) 20 MG tablet   HYDROcodone  bit-homatropine (HYCODAN) 5-1.5 MG/5ML syrup   levalbuterol  (XOPENEX  HFA) 45 MCG/ACT inhaler   metoprolol  tartrate (LOPRESSOR ) 25 MG tablet   ondansetron  (ZOFRAN ) 8 MG tablet   prochlorperazine  (COMPAZINE ) 10 MG tablet   thiamine  (VITAMIN B-1) 100 MG tablet    Isaiah Ruder, PA-C Surgical Short Stay/Anesthesiology [facility] Phone [phone] [facility] Phone [phone] 08/25/2023 3:18 PM

## 2023-08-25 NOTE — Progress Notes (Addendum)
 PCP - [facility]   Cardiologist - Johnson, [place], PA-C   PPM/ICD - denies Device Orders - n/a Rep Notified - n/a  Chest x-ray - 06-06-23 EKG - 06-13-23 Stress Test - denies ECHO - 05-06-19 Cardiac Cath - denies  CPAP - uses nightly Sleep study 02-03-23  DM -denies  Blood Thinner Instructions: apixaban  (ELIQUIS ) Last dose 08-25-23 Aspirin  Instructions: denies  ERAS Protcol - NPO  COVID TEST- n/a  Anesthesia review: yes,  Patient verbally denies any shortness of breath, fever, cough and chest pain during phone call   -------------  SDW INSTRUCTIONS given:  Your procedure is scheduled on August 28, 2023.  Report to [facility]trance A at 12:00 PM., and check in at the Admitting office.  Call this number if you have problems the morning of surgery:  [phone]   Remember:  Do not eat or drink after midnight the night before your surgery      Take these medicines the morning of surgery with A SIP OF WATER  acetaminophen  (TYLENOL )  allopurinol  (ZYLOPRIM )  HYDROcodone  bit-homatropine (HYCODAN)  levalbuterol  (XOPENEX  HFA) inhaler  metoprolol  tartrate (LOPRESSOR )   apixaban  (ELIQUIS ) LAST DSOE 08-25-23   As of today, STOP taking any Aspirin  (unless otherwise instructed by your surgeon) Aleve, Naproxen, Ibuprofen, Motrin, Advil, Goody's, BC's, all herbal medications, fish oil, and all vitamins.                      Do not wear jewelry, make up, or nail polish            Do not wear lotions, powders, perfumes/colognes, or deodorant.            Do not shave 48 hours prior to surgery.  Men may shave face and neck.            Do not bring valuables to the hospital.            [facility] is not responsible for any belongings or valuables.  Do NOT Smoke (Tobacco/Vaping) 24 hours prior to your procedure If you use a CPAP at night, you may bring all equipment for your overnight stay.   Contacts, glasses, dentures or bridgework  may not be worn into surgery.      For patients admitted to the hospital, discharge time will be determined by your treatment team.   Patients discharged the day of surgery will not be allowed to drive home, and someone needs to stay with them for 24 hours.    Special instructions:   - Preparing For Surgery  Before surgery, you can play an important role. Because skin is not sterile, your skin needs to be as free of germs as possible. You can reduce the number of germs on your skin by washing with CHG (chlorahexidine gluconate) Soap before surgery.  CHG is an antiseptic cleaner which kills germs and bonds with the skin to continue killing germs even after washing.    Oral Hygiene is also important to reduce your risk of infection.  Remember - BRUSH YOUR TEETH THE MORNING OF SURGERY WITH YOUR REGULAR TOOTHPASTE  Please do not use if you have an allergy to CHG or antibacterial soaps. If your skin becomes reddened/irritated stop using the CHG.  Do not shave (including legs and underarms) for at least 48 hours prior to first CHG shower. It is OK to shave your face.  Please follow these instructions carefully.  Shower the NIGHT BEFORE SURGERY and the MORNING OF SURGERY with DIAL Soap.   Pat yourself dry with a CLEAN TOWEL.  Wear CLEAN PAJAMAS to bed the night before surgery  Place CLEAN SHEETS on your bed the night of your first shower and DO NOT SLEEP WITH PETS.   Day of Surgery: Please shower morning of surgery  Wear Clean/Comfortable clothing the morning of surgery Do not apply any deodorants/lotions.   Remember to brush your teeth WITH YOUR REGULAR TOOTHPASTE.   Questions were answered. Patient verbalized understanding of instructions.

## 2023-08-25 NOTE — Anesthesia Preprocedure Evaluation (Addendum)
 Anesthesia Evaluation  Patient identified by MRN, date of birth, ID band Patient awake    Reviewed: Allergy & Precautions, NPO status , Patient's Chart, lab work & pertinent test results  Airway Mallampati: III  TM Distance: >3 FB Neck ROM: Full    Dental  (+) Poor Dentition, Chipped, Missing, Dental Advisory Given   Pulmonary sleep apnea and Continuous Positive Airway Pressure Ventilation , former smoker Lung mass. Pleural effusion s/p thoracentesis   Pulmonary exam normal        Cardiovascular hypertension, Pt. on home beta blockers and Pt. on medications + DOE  + dysrhythmias Atrial Fibrillation  Rhythm:Regular Rate:Normal     Neuro/Psych  PSYCHIATRIC DISORDERS      TIACVA    GI/Hepatic negative GI ROS, Neg liver ROS,,,  Endo/Other  Adrenal mass favored to be myelolipoma  Renal/GU negative Renal ROS     Musculoskeletal   Abdominal   Peds  Hematology  (+) Blood dyscrasia, anemia   Anesthesia Other Findings   Reproductive/Obstetrics                              Anesthesia Physical Anesthesia Plan  ASA: 3  Anesthesia Plan: General   Post-op Pain Management:    Induction: Intravenous  PONV Risk Score and Plan: 1 and Ondansetron , Propofol  infusion, Dexamethasone  and TIVA  Airway Management Planned: Oral ETT  Additional Equipment: None  Intra-op Plan:   Post-operative Plan: Extubation in OR  Informed Consent: I have reviewed the patients History and Physical, chart, labs and discussed the procedure including the risks, benefits and alternatives for the proposed anesthesia with the patient or authorized representative who has indicated his/her understanding and acceptance.     Dental advisory given  Plan Discussed with: CRNA and Anesthesiologist  Anesthesia Plan Comments: (PAT note written 08/25/2023 by Isaiah Ruder, PA-C.  DISCUSSION: Patient is a 73 year old male  scheduled for the above procedure. S/p Video fiberoptic bronchoscopy with endobronchial ultrasound and biopsies on 06/12/2023. LUL lung biopsy with malignant cells with left hilar FNA + metastatic carcinoma--poorly differentiated squamous cell carcinoma favored.  He was readmitted the next day overnight for afib with RVR (known afb) and hypotension requiring brief Levophed  with improvement in BP. HR improved after resuming metoprolol . Imaging did not suggest pneumothorax, PE, or dissection and clinical picture not consistent with Septic, Cardiogenic, or Hypovolemic shock. He has undergone simultaneous SBRT to chest and hypermetabolic left adrenal lesion and concurrent paclitaxel /carboplatin , last dose 08/23/23. He will complete radiation on 09/01/23. A repeat bronchoscopy was recommended to obtain additional tissue for PD-1 testing.    Other history includes former smoker (quit 1992), TIA (2020), permanent afib, leg edema/lymphedema, severe OSA (uses CPAP), right adrenal tumor (favored to be myelolipoma with hypermetabolic left adrenal lesion, s/p SBRT), LUL lung/hilar malignancy (SCC favored, s/p SBRT, chemotherapy), dysphonia/left vocal cord paralysis (due to lung mass SCCa compressing the recurrent laryngeal nerve, s/p laryngoscopy with Restylane injection 06/29/23 & 07/31/23).  Alcohol use is documented as 4-5 beers daily.   Last office visit with cardiology was on 08/10/23 with Strader, Brittany, PA-C. Overall tolerating cancer treatments. Denied recent chest pains or palpitations. Known DOE, most notable with inclines. Denied orthopnea or PND. Using CPAP. Afib overall rate controlled on Lopressor  25 mg BID. Tolerating Eliquis . Taking Lasix  20 mg daily for LE edema because he could not tolerate 40 mg due to frequent urination. Last echo in 2021 showed preserved EF with no RWMA. Consider updating  echo in the future, but she held off due to him already having frequent visits for chemoradiation. Encouraged to  continue compression stockings. Six month follow-up planned.    Dr. Shelah advised holding Eliquis  for 2 days prior to procedure.    He is a same-day workup, so anesthesia team to evaluate on the day of surgery.  He did have a CMP and CBC done on 08/23/23 (per Dr. Deanne, prechemo). Results include sodium 137, potassium 3.7, glucose 101, creatinine 0.66, AST 13, ALT 9, WBC 2.3, hemoglobin 10.9, hematocrit 32.6, platelet count 130K.  EKG 06/05/23: Atrial fibrillation at 83 bpm Left axis deviation Borderline prolonged QT interval Confirmed by Yolande Charleston [phone]) on 06/05/2023 3:05:34 PM     CV: Echo 05/06/19: IMPRESSIONS   1. Left ventricular ejection fraction, by estimation, is 55 to 60%. The  left ventricle has normal function. The left ventricle has no regional  wall motion abnormalities. There is mild left ventricular hypertrophy.  Left ventricular diastolic parameters  are indeterminate.   2. Right ventricular systolic function is normal. The right ventricular  size is normal.   3. The mitral valve is normal in structure. Trivial mitral valve  regurgitation. No evidence of mitral stenosis.   4. The aortic valve has an indeterminant number of cusps. Aortic valve  regurgitation is not visualized. No aortic stenosis is present.   5. Aortic dilatation noted. There is mild to moderate dilatation of the  aortic root measuring 43 mm.   6. Indeterminant PASP, inadequate TR jet.   7. The inferior vena cava is normal in size with greater than 50%  respiratory variability, suggesting right atrial pressure of 3 mmHg.    US  Carotid 06/08/18: IMPRESSION: Color duplex indicates minimal heterogeneous plaque, with no hemodynamically significant stenosis by duplex criteria in the extracranial cerebrovascular circulation.    )        Anesthesia Quick Evaluation

## 2023-08-26 ENCOUNTER — Ambulatory Visit (HOSPITAL_COMMUNITY)
Admission: RE | Admit: 2023-08-26 | Discharge: 2023-08-26 | Disposition: A | Discharge status: Home / Self Care | Source: Ambulatory Visit | Attending: Emergency Medicine | Admitting: Emergency Medicine

## 2023-08-26 DIAGNOSIS — J90 Pleural effusion, not elsewhere classified: Secondary | ICD-10-CM | POA: Diagnosis not present

## 2023-08-26 DIAGNOSIS — C3412 Malignant neoplasm of upper lobe, left bronchus or lung: Secondary | ICD-10-CM | POA: Diagnosis not present

## 2023-08-27 ENCOUNTER — Other Ambulatory Visit: Payer: Self-pay

## 2023-08-28 ENCOUNTER — Observation Stay (HOSPITAL_COMMUNITY)
Admission: RE | Admit: 2023-08-28 | Discharge: 2023-08-29 | Disposition: A | Discharge status: Home / Self Care | Attending: Emergency Medicine | Admitting: Emergency Medicine

## 2023-08-28 ENCOUNTER — Ambulatory Visit (HOSPITAL_COMMUNITY)

## 2023-08-28 ENCOUNTER — Other Ambulatory Visit: Payer: Self-pay

## 2023-08-28 ENCOUNTER — Ambulatory Visit

## 2023-08-28 ENCOUNTER — Encounter (HOSPITAL_COMMUNITY)
Admission: RE | Disposition: A | Discharge status: Home / Self Care | Payer: Self-pay | Source: Home / Self Care | Attending: Emergency Medicine

## 2023-08-28 ENCOUNTER — Ambulatory Visit (HOSPITAL_COMMUNITY): Payer: Self-pay | Admitting: Vascular Surgery

## 2023-08-28 ENCOUNTER — Encounter (HOSPITAL_COMMUNITY): Payer: Self-pay | Admitting: Emergency Medicine

## 2023-08-28 DIAGNOSIS — J811 Chronic pulmonary edema: Secondary | ICD-10-CM | POA: Diagnosis not present

## 2023-08-28 DIAGNOSIS — G4733 Obstructive sleep apnea (adult) (pediatric): Secondary | ICD-10-CM | POA: Insufficient documentation

## 2023-08-28 DIAGNOSIS — Z48813 Encounter for surgical aftercare following surgery on the respiratory system: Secondary | ICD-10-CM | POA: Diagnosis not present

## 2023-08-28 DIAGNOSIS — C3412 Malignant neoplasm of upper lobe, left bronchus or lung: Principal | ICD-10-CM | POA: Insufficient documentation

## 2023-08-28 DIAGNOSIS — F1090 Alcohol use, unspecified, uncomplicated: Secondary | ICD-10-CM | POA: Insufficient documentation

## 2023-08-28 DIAGNOSIS — R918 Other nonspecific abnormal finding of lung field: Secondary | ICD-10-CM | POA: Diagnosis not present

## 2023-08-28 DIAGNOSIS — I517 Cardiomegaly: Secondary | ICD-10-CM | POA: Diagnosis not present

## 2023-08-28 DIAGNOSIS — I10 Essential (primary) hypertension: Secondary | ICD-10-CM

## 2023-08-28 DIAGNOSIS — J95811 Postprocedural pneumothorax: Secondary | ICD-10-CM | POA: Diagnosis not present

## 2023-08-28 DIAGNOSIS — Z87891 Personal history of nicotine dependence: Secondary | ICD-10-CM

## 2023-08-28 DIAGNOSIS — I4891 Unspecified atrial fibrillation: Secondary | ICD-10-CM | POA: Insufficient documentation

## 2023-08-28 DIAGNOSIS — J9383 Other pneumothorax: Secondary | ICD-10-CM | POA: Insufficient documentation

## 2023-08-28 DIAGNOSIS — M109 Gout, unspecified: Secondary | ICD-10-CM | POA: Diagnosis not present

## 2023-08-28 DIAGNOSIS — J449 Chronic obstructive pulmonary disease, unspecified: Secondary | ICD-10-CM | POA: Diagnosis not present

## 2023-08-28 DIAGNOSIS — I480 Paroxysmal atrial fibrillation: Secondary | ICD-10-CM | POA: Diagnosis not present

## 2023-08-28 DIAGNOSIS — I700 Atherosclerosis of aorta: Secondary | ICD-10-CM | POA: Diagnosis not present

## 2023-08-28 DIAGNOSIS — C3410 Malignant neoplasm of upper lobe, unspecified bronchus or lung: Secondary | ICD-10-CM | POA: Diagnosis present

## 2023-08-28 HISTORY — PX: CRYOTHERAPY: SHX6894

## 2023-08-28 HISTORY — PX: VIDEO BRONCHOSCOPY WITH ENDOBRONCHIAL ULTRASOUND: SHX6177

## 2023-08-28 HISTORY — PX: BRONCHIAL NEEDLE ASPIRATION BIOPSY: SHX5106

## 2023-08-28 HISTORY — PX: VIDEO BRONCHOSCOPY WITH ENDOBRONCHIAL NAVIGATION: SHX6175

## 2023-08-28 SURGERY — VIDEO BRONCHOSCOPY WITH ENDOBRONCHIAL NAVIGATION
Anesthesia: General | Laterality: Left

## 2023-08-28 MED ORDER — ONDANSETRON HCL 4 MG/2ML IJ SOLN
INTRAMUSCULAR | Status: DC | PRN
  Administered 2023-08-28: 4 mg via INTRAVENOUS

## 2023-08-28 MED ORDER — EPINEPHRINE (ANAPHYLAXIS) 30 MG/30ML IJ SOLN
INTRAMUSCULAR | Status: DC | PRN
  Administered 2023-08-28 (×2): 2 mL via ENDOTRACHEOPULMONARY

## 2023-08-28 MED ORDER — ONDANSETRON HCL 8 MG PO TABS: 8.0000 mg | ORAL_TABLET | Freq: Three times a day (TID) | ORAL | Status: DC | PRN

## 2023-08-28 MED ORDER — MEPERIDINE HCL 25 MG/ML IJ SOLN: 6.2500 mg | INTRAMUSCULAR | Status: DC | PRN

## 2023-08-28 MED ORDER — PHENYLEPHRINE HCL-NACL 20-0.9 MG/250ML-% IV SOLN
INTRAVENOUS | Status: DC | PRN
Start: 2023-08-28 — End: 2023-08-28
  Administered 2023-08-28: 60 ug/min via INTRAVENOUS

## 2023-08-28 MED ORDER — APIXABAN 5 MG PO TABS: 5.0000 mg | ORAL_TABLET | Freq: Two times a day (BID) | ORAL | Status: DC

## 2023-08-28 MED ORDER — CHLORHEXIDINE GLUCONATE 0.12 % MT SOLN
15.0000 mL | Freq: Once | OROMUCOSAL | Status: AC
  Administered 2023-08-28: 15 mL via OROMUCOSAL

## 2023-08-28 MED ORDER — OXYCODONE HCL 5 MG/5ML PO SOLN: 5.0000 mg | Freq: Once | ORAL | Status: DC | PRN

## 2023-08-28 MED ORDER — PHENYLEPHRINE 80 MCG/ML (10ML) SYRINGE FOR IV PUSH (FOR BLOOD PRESSURE SUPPORT)
PREFILLED_SYRINGE | INTRAVENOUS | Status: DC | PRN
  Administered 2023-08-28 (×4): 80 ug via INTRAVENOUS

## 2023-08-28 MED ORDER — VITAMIN B-12 100 MCG PO TABS
100.0000 ug | ORAL_TABLET | Freq: Every day | ORAL | Status: DC
  Administered 2023-08-29: 100 ug via ORAL
  Filled 2023-08-28 (×2): qty 1

## 2023-08-28 MED ORDER — ROCURONIUM BROMIDE 10 MG/ML (PF) SYRINGE
PREFILLED_SYRINGE | INTRAVENOUS | Status: DC | PRN
  Administered 2023-08-28: 20 mg via INTRAVENOUS
  Administered 2023-08-28: 50 mg via INTRAVENOUS

## 2023-08-28 MED ORDER — DEXAMETHASONE SODIUM PHOSPHATE 10 MG/ML IJ SOLN
INTRAMUSCULAR | Status: DC | PRN
  Administered 2023-08-28: 10 mg via INTRAVENOUS

## 2023-08-28 MED ORDER — LACTATED RINGERS IV SOLN: INTRAVENOUS | Status: DC

## 2023-08-28 MED ORDER — SUGAMMADEX SODIUM 200 MG/2ML IV SOLN
INTRAVENOUS | Status: DC | PRN
  Administered 2023-08-28: 200 mg via INTRAVENOUS

## 2023-08-28 MED ORDER — VITAMIN D 25 MCG (1000 UNIT) PO TABS
1000.0000 [IU] | ORAL_TABLET | Freq: Every day | ORAL | Status: DC
  Administered 2023-08-28 – 2023-08-29 (×2): 1000 [IU] via ORAL
  Filled 2023-08-28 (×2): qty 1

## 2023-08-28 MED ORDER — ESMOLOL HCL 100 MG/10ML IV SOLN
INTRAVENOUS | Status: DC | PRN
  Administered 2023-08-28 (×2): 10 mg via INTRAVENOUS

## 2023-08-28 MED ORDER — THIAMINE MONONITRATE 100 MG PO TABS
100.0000 mg | ORAL_TABLET | Freq: Every day | ORAL | Status: DC
  Administered 2023-08-29: 100 mg via ORAL
  Filled 2023-08-28 (×2): qty 1

## 2023-08-28 MED ORDER — ACETAMINOPHEN 500 MG PO TABS: 500.0000 mg | ORAL_TABLET | Freq: Four times a day (QID) | ORAL | Status: DC | PRN

## 2023-08-28 MED ORDER — OXYCODONE HCL 5 MG PO TABS: 5.0000 mg | ORAL_TABLET | Freq: Once | ORAL | Status: DC | PRN

## 2023-08-28 MED ORDER — FENTANYL CITRATE (PF) 100 MCG/2ML IJ SOLN: 25.0000 ug | INTRAMUSCULAR | Status: DC | PRN

## 2023-08-28 MED ORDER — METOPROLOL TARTRATE 25 MG PO TABS
25.0000 mg | ORAL_TABLET | Freq: Two times a day (BID) | ORAL | Status: DC
  Administered 2023-08-28 – 2023-08-29 (×2): 25 mg via ORAL
  Filled 2023-08-28 (×2): qty 1

## 2023-08-28 MED ORDER — PROPOFOL 10 MG/ML IV BOLUS
INTRAVENOUS | Status: DC | PRN
  Administered 2023-08-28: 150 mg via INTRAVENOUS

## 2023-08-28 MED ORDER — ALBUTEROL SULFATE (2.5 MG/3ML) 0.083% IN NEBU: 2.5000 mg | INHALATION_SOLUTION | RESPIRATORY_TRACT | Status: DC | PRN

## 2023-08-28 MED ORDER — PROCHLORPERAZINE MALEATE 10 MG PO TABS: 10.0000 mg | ORAL_TABLET | Freq: Four times a day (QID) | ORAL | Status: DC | PRN

## 2023-08-28 MED ORDER — ALLOPURINOL 100 MG PO TABS
100.0000 mg | ORAL_TABLET | Freq: Every day | ORAL | Status: DC
Start: 2023-08-28 — End: 2023-08-29
  Administered 2023-08-28 – 2023-08-29 (×2): 100 mg via ORAL
  Filled 2023-08-28 (×2): qty 1

## 2023-08-28 MED ORDER — CHLORHEXIDINE GLUCONATE 0.12 % MT SOLN
OROMUCOSAL | Status: AC
  Filled 2023-08-28: qty 15

## 2023-08-28 MED ORDER — EPINEPHRINE 1 MG/10ML IJ SOSY
PREFILLED_SYRINGE | INTRAMUSCULAR | Status: AC
  Filled 2023-08-28: qty 10

## 2023-08-28 MED ORDER — LIDOCAINE 2% (20 MG/ML) 5 ML SYRINGE
INTRAMUSCULAR | Status: DC | PRN
  Administered 2023-08-28: 100 mg via INTRAVENOUS

## 2023-08-28 MED ORDER — HYDROCODONE BIT-HOMATROP MBR 5-1.5 MG/5ML PO SOLN: 5.0000 mL | Freq: Four times a day (QID) | ORAL | Status: DC | PRN

## 2023-08-28 MED ORDER — FUROSEMIDE 20 MG PO TABS
20.0000 mg | ORAL_TABLET | Freq: Every day | ORAL | Status: DC
  Administered 2023-08-28 – 2023-08-29 (×2): 20 mg via ORAL
  Filled 2023-08-28 (×2): qty 1

## 2023-08-28 MED ORDER — ONDANSETRON HCL 4 MG/2ML IJ SOLN: 4.0000 mg | Freq: Once | INTRAMUSCULAR | Status: DC | PRN

## 2023-08-28 SURGICAL SUPPLY — 36 items

## 2023-08-28 NOTE — H&P (Signed)
 Raymond Hurst is an 73 y.o. male.   Chief Complaint: Squamous cell lung cancer, left upper lobe mass HPI: 73 year old man with a diagnosis of a 40 differentiated squamous cell lung cancer left upper lobe by bronchoscopy in May.  He has undergone chemotherapy and radiation therapy with some decrease in size evidence left upper lobe mass, decreased tumor burden.  He is being considered for immunotherapy but has incomplete molecular studies.  He is here today for repeat bronchoscopy to obtain tissue for molecular, specifically PD-L1.  He states that he has been tired, decreased energy.  He had some dyspepsia earlier today, now resolved.  Past Medical History:  Diagnosis Date   Adrenal tumor    right adrenal gland tumor favored to be a myelolipoma   Atrial fibrillation (HCC)    Cellulitis of right lower leg    Dysrhythmia    A-fib   History of TIA (transient ischemic attack)    May 2020   Lymphedema    OSA on CPAP    severe obstructive sleep apnea with an AHI of 54/h with no significant central events.  He had nocturnal hypoxemia with O2 sat nadir of 70%. Now on CPAP   Pulmonary mass    Stroke [facility])     Past Surgical History:  Procedure Laterality Date   BRONCHIAL BIOPSY  06/12/2023   Procedure: BRONCHOSCOPY, WITH BIOPSY;  Surgeon: Shelah Lamar RAMAN, MD;  Location: [facility] ENDOSCOPY;  Service: Cardiopulmonary;;   BRONCHIAL BRUSHINGS  06/12/2023   Procedure: BRONCHOSCOPY, WITH BRUSH BIOPSY;  Surgeon: Shelah Lamar RAMAN, MD;  Location: [facility] ENDOSCOPY;  Service: Cardiopulmonary;;   BRONCHIAL NEEDLE ASPIRATION BIOPSY  06/12/2023   Procedure: BRONCHOSCOPY, WITH NEEDLE ASPIRATION BIOPSY;  Surgeon: Shelah Lamar RAMAN, MD;  Location: [facility] ENDOSCOPY;  Service: Cardiopulmonary;;   HEMOSTASIS CLIP PLACEMENT  06/12/2023   Procedure: CONTROL OF HEMORRHAGE bronch cold saline and epi;  Surgeon: Shelah Lamar RAMAN, MD;  Location: [facility] ENDOSCOPY;  Service: Cardiopulmonary;;   INNER EAR SURGERY     KNEE SURGERY Left     THORACENTESIS Left 06/06/2023   Procedure: THORACENTESIS;  Surgeon: Arlinda Ranks, MD;  Location: [facility] ENDOSCOPY;  Service: Pulmonary;  Laterality: Left;   TONSILLECTOMY     VIDEO BRONCHOSCOPY WITH ENDOBRONCHIAL ULTRASOUND Left 06/12/2023   Procedure: BRONCHOSCOPY, WITH EBUS;  Surgeon: Shelah Lamar RAMAN, MD;  Location: [facility] ENDOSCOPY;  Service: Cardiopulmonary;  Laterality: Left;    Family History  Problem Relation Age of Onset   Heart failure Mother    COPD Mother    Atrial fibrillation Mother    Idiopathic pulmonary fibrosis Father    Social History:  reports that he quit smoking about 33 years ago. His smoking use included cigarettes. He started smoking about 53 years ago. He has a 40 pack-year smoking history. He has never used smokeless tobacco. He reports current alcohol use. He reports that he does not use drugs.  Allergies: No Known Allergies  Medications Prior to Admission  Medication Sig Dispense Refill   acetaminophen  (TYLENOL ) 500 MG tablet Take 500 mg by mouth every 6 (six) hours as needed for mild pain.     allopurinol  (ZYLOPRIM ) 100 MG tablet Take 100 mg by mouth daily.     cholecalciferol  (VITAMIN D3) 25 MCG (1000 UNIT) tablet Take 1,000 Units by mouth daily.     cyanocobalamin  (VITAMIN B12) 100 MCG tablet Take 100 mcg by mouth daily.     furosemide  (LASIX ) 20 MG tablet Take 1 tablet (20 mg total) by mouth daily.  90 tablet 3   HYDROcodone  bit-homatropine (HYCODAN) 5-1.5 MG/5ML syrup Take 5 mLs by mouth every 6 (six) hours as needed for cough. 240 mL 0   levalbuterol  (XOPENEX  HFA) 45 MCG/ACT inhaler Inhale 1 puff into the lungs every 4 (four) hours as needed for wheezing. 15 each 2   metoprolol  tartrate (LOPRESSOR ) 25 MG tablet Take 1 tablet (25 mg total) by mouth 2 (two) times daily. 180 tablet 3   thiamine  (VITAMIN B-1) 100 MG tablet Take 100 mg by mouth daily.     apixaban  (ELIQUIS ) 5 MG TABS tablet Take 1 tablet (5 mg total) by mouth 2 (two) times daily. Okay to restart this  medication on 06/13/2023     budesonide-glycopyrrolate-formoterol (BREZTRI  AEROSPHERE) 160-9-4.8 MCG/ACT AERO inhaler Inhale 2 puffs into the lungs in the morning and at bedtime. (Patient not taking: Reported on 08/23/2023)     ondansetron  (ZOFRAN ) 8 MG tablet Take 1 tablet (8 mg total) by mouth every 8 (eight) hours as needed (As needed for nausea starting 3 days after chemo). (Patient not taking: Reported on 08/23/2023) 20 tablet 0   prochlorperazine  (COMPAZINE ) 10 MG tablet Take 1 tablet (10 mg total) by mouth every 6 (six) hours as needed for nausea or vomiting. (Patient not taking: Reported on 08/23/2023) 30 tablet 0    No results found for this or any previous visit (from the past 48 hours). No results found.  Review of Systems As per HPI Blood pressure 124/70, pulse 93, temperature (!) 97.1 F (36.2 C), temperature source Oral, resp. rate 18, height 6' (1.829 m), weight 120.3 kg, SpO2 97%. Physical Exam  Gen: Pleasant, well-nourished, in no distress,  normal affect  ENT: No lesions,  mouth clear,  oropharynx clear, no postnasal drip  Neck: No JVD, no stridor  Lungs: No use of accessory muscles, no crackles or wheezing on normal respiration, no wheeze on forced expiration  Cardiovascular: RRR, heart sounds normal, no murmur or gallops, no peripheral edema  Musculoskeletal: No deformities, no cyanosis or clubbing  Neuro: alert, awake, non focal  Skin: Warm, no lesions or rashes    Assessment/Plan Squamous cell lung cancer of the left upper lobe on active treatment.  He is being considered for possible immunotherapy but needs more tissue for molecular testing.  We have planned for robotic bronchoscopy with navigation, biopsies.  Will consider possible endobronchial ultrasound as well if this would be helpful and obtaining adequate tissue.  Patient understands the risks, benefits, rationale of the procedure.  No barriers identified.  Lamar GORMAN Chris, MD 08/28/2023, 1:27 PM

## 2023-08-28 NOTE — Transfer of Care (Signed)
 Immediate Anesthesia Transfer of Care Note  Patient: Raymond Hurst  Procedure(s) Performed: VIDEO BRONCHOSCOPY WITH ENDOBRONCHIAL NAVIGATION (Left) BRONCHOSCOPY, WITH NEEDLE ASPIRATION BIOPSY BRONCHOSCOPY, WITH EBUS CRYOTHERAPY  Patient Location: PACU and Endoscopy Unit  Anesthesia Type:General  Level of Consciousness: drowsy and patient cooperative  Airway & Oxygen Therapy: Patient Spontanous Breathing and Patient connected to face mask oxygen  Post-op Assessment: Report given to RN, Post -op Vital signs reviewed and stable, and Patient moving all extremities  Post vital signs: Reviewed and stable  Last Vitals:  Vitals Value Taken Time  BP 119/75 08/28/23 15:26  Temp    Pulse 89 08/28/23 15:28  Resp 20 08/28/23 15:28  SpO2 100 % 08/28/23 15:28  Vitals shown include unfiled device data.  Last Pain:  Vitals:   08/28/23 1227  TempSrc:   PainSc: 0-No pain      Patients Stated Pain Goal: 0 (08/28/23 1227)  Complications: No notable events documented.

## 2023-08-28 NOTE — Plan of Care (Signed)

## 2023-08-28 NOTE — Plan of Care (Signed)

## 2023-08-28 NOTE — H&P (Addendum)
 NAME:  Raymond Hurst, MRN:  [id number], DOB:  Sep 13, 1950, LOS: 0 ADMISSION DATE:  08/28/2023,  CHIEF COMPLAINT: Iatrogenic left pneumothorax  History of Present Illness:  73 year old man with atrial fibrillation, history of TIA, OSA on CPAP and a new diagnosis of left upper lobe squamous cell lung cancer from May 2025.  He has been treated with chemoradiation with good interval response.  He underwent an elective repeat bronchoscopy on 08/28/2023 to get additional left upper lobe tissue to send for molecular studies including PD-L1.  Procedure was uncomplicated.  Postprocedural chest x-ray showed a probable pneumothorax with some focal right upper lobe collapse.  He is asymptomatic.  Will admit him for observation and consider pigtail chest tube if any evidence progression or symptoms evolve.  Pertinent  Medical History   Past Medical History:  Diagnosis Date   Adrenal tumor    right adrenal gland tumor favored to be a myelolipoma   Atrial fibrillation (HCC)    Cellulitis of right lower leg    Dysrhythmia    A-fib   History of TIA (transient ischemic attack)    May 2020   Lymphedema    OSA on CPAP    severe obstructive sleep apnea with an AHI of 54/h with no significant central events.  He had nocturnal hypoxemia with O2 sat nadir of 70%. Now on CPAP   Pulmonary mass    Stroke (HCC)      Significant Hospital Events: Including procedures, antibiotic start and stop dates in addition to other pertinent events     Interim History / Subjective:  No current complaints  Objective    Blood pressure 112/69, pulse 91, temperature (!) 97.5 F (36.4 C), temperature source Temporal, resp. rate 13, height 6' (1.829 m), weight 120.3 kg, SpO2 97%.        Intake/Output Summary (Last 24 hours) at 08/28/2023 1622 Last data filed at 08/28/2023 1518 Gross per 24 hour  Intake 600 ml  Output 10 ml  Net 590 ml   Filed Weights   08/25/23 1621 08/28/23 1218  Weight: 120.3 kg 120.3 kg     Examination: General: Obese elderly gentleman in bed, no distress HENT: Oropharynx clear.  Poor dentition. Lungs: Clear bilaterally.  Good breath sounds bilaterally Cardiovascular: Irregularly irregular, no murmur Abdomen: Nondistended with positive bowel sounds Extremities: No edema Neuro: Awake and alert, interacts verbally, nonfocal GU: Deferred  Resolved problem list   Assessment and Plan   Suspected left pneumothorax with focal left upper lobe collapse.  He has a chronically abnormal chest x-ray and interpretation is somewhat complicated but it does appear that he has a pleural line present. - Admit for observation - Follow chest x-ray and clinical status.  If any evidence of progression or failure to resolve then would probably need pigtail chest tube - If any question about pneumothorax presence or persistence then could obtain a CT scan of his chest to confirm  COPD - He has Breztri  but has not been using it at home - Albuterol  available  OSA - Will hold his CPAP tonight 7/28 to avoid positive pressure  Atrial fibrillation - Continue home metoprolol , Lasix  - Eliquis  on hold, will restart on 7/29 if we do not believe he will require pigtail  Gout - Continue home allopurinol   Best Practice (right click and Reselect all SmartList Selections daily)   Diet/type: Regular consistency (see orders) DVT prophylaxis SCD Pressure ulcer(s): N/A GI prophylaxis: N/A Lines: N/A Foley:  N/A Code Status:  full code  Last date of multidisciplinary goals of care discussion [pending]  Labs   CBC: Recent Labs  Lab 08/23/23 1100  WBC 2.3*  NEUTROABS 1.8  HGB 10.9*  HCT 32.6*  MCV 85.1  PLT 130*    Basic Metabolic Panel: Recent Labs  Lab 08/23/23 1100  NA 137  K 3.7  CL 103  CO2 23  GLUCOSE 101*  BUN 8  CREATININE 0.66  CALCIUM  9.3   GFR: Estimated Creatinine Clearance: 113.4 mL/min (by C-G formula based on SCr of 0.66 mg/dL). Recent Labs  Lab  08/23/23 1100  WBC 2.3*    Liver Function Tests: Recent Labs  Lab 08/23/23 1100  AST 13*  ALT 9  ALKPHOS 56  BILITOT 0.5  PROT 6.4*  ALBUMIN 3.4*   No results for input(s): LIPASE, AMYLASE in the last 168 hours. No results for input(s): AMMONIA in the last 168 hours.  ABG    Component Value Date/Time   HCO3 28.5 (H) 06/13/2023 0047   O2SAT 69.4 06/13/2023 0047     Coagulation Profile: No results for input(s): INR, PROTIME in the last 168 hours.  Cardiac Enzymes: No results for input(s): CKTOTAL, CKMB, CKMBINDEX, TROPONINI in the last 168 hours.  HbA1C: Hgb A1c MFr Bld  Date/Time Value Ref Range Status  06/08/2018 12:25 AM 5.2 4.8 - 5.6 % Final    Comment:    (NOTE) Pre diabetes:          5.7%-6.4% Diabetes:              >6.4% Glycemic control for   <7.0% adults with diabetes     CBG: No results for input(s): GLUCAP in the last 168 hours.  Review of Systems:   As per HPI  Past Medical History:  He,  has a past medical history of Adrenal tumor, Atrial fibrillation (HCC), Cellulitis of right lower leg, Dysrhythmia, History of TIA (transient ischemic attack), Lymphedema, OSA on CPAP, Pulmonary mass, and Stroke (HCC).   Surgical History:   Past Surgical History:  Procedure Laterality Date   BRONCHIAL BIOPSY  06/12/2023   Procedure: BRONCHOSCOPY, WITH BIOPSY;  Surgeon: Shelah Lamar RAMAN, MD;  Location: [facility] ENDOSCOPY;  Service: Cardiopulmonary;;   BRONCHIAL BRUSHINGS  06/12/2023   Procedure: BRONCHOSCOPY, WITH BRUSH BIOPSY;  Surgeon: Shelah Lamar RAMAN, MD;  Location: [facility] ENDOSCOPY;  Service: Cardiopulmonary;;   BRONCHIAL NEEDLE ASPIRATION BIOPSY  06/12/2023   Procedure: BRONCHOSCOPY, WITH NEEDLE ASPIRATION BIOPSY;  Surgeon: Shelah Lamar RAMAN, MD;  Location: [facility] ENDOSCOPY;  Service: Cardiopulmonary;;   HEMOSTASIS CLIP PLACEMENT  06/12/2023   Procedure: CONTROL OF HEMORRHAGE bronch cold saline and epi;  Surgeon: Shelah Lamar RAMAN, MD;  Location: [facility]  ENDOSCOPY;  Service: Cardiopulmonary;;   INNER EAR SURGERY     KNEE SURGERY Left    THORACENTESIS Left 06/06/2023   Procedure: THORACENTESIS;  Surgeon: Arlinda Ranks, MD;  Location: [facility] ENDOSCOPY;  Service: Pulmonary;  Laterality: Left;   TONSILLECTOMY     VIDEO BRONCHOSCOPY WITH ENDOBRONCHIAL ULTRASOUND Left 06/12/2023   Procedure: BRONCHOSCOPY, WITH EBUS;  Surgeon: Shelah Lamar RAMAN, MD;  Location: [facility] ENDOSCOPY;  Service: Cardiopulmonary;  Laterality: Left;     Social History:   reports that he quit smoking about 33 years ago. His smoking use included cigarettes. He started smoking about 53 years ago. He has a 40 pack-year smoking history. He has never used smokeless tobacco. He reports current alcohol use. He reports that he does not use drugs.   Family History:  His family history includes Atrial  fibrillation in his mother; COPD in his mother; Heart failure in his mother; Idiopathic pulmonary fibrosis in his father.   Allergies No Known Allergies   Home Medications  Prior to Admission medications   Medication Sig Start Date End Date Taking? Authorizing Provider  acetaminophen  (TYLENOL ) 500 MG tablet Take 500 mg by mouth every 6 (six) hours as needed for mild pain.   Yes [provider]  allopurinol  (ZYLOPRIM ) 100 MG tablet Take 100 mg by mouth daily. 05/25/22  Yes [provider]  cholecalciferol  (VITAMIN D3) 25 MCG (1000 UNIT) tablet Take 1,000 Units by mouth daily.   Yes [provider]  cyanocobalamin  (VITAMIN B12) 100 MCG tablet Take 100 mcg by mouth daily.   Yes [provider]  furosemide  (LASIX ) 20 MG tablet Take 1 tablet (20 mg total) by mouth daily. 08/10/23  Yes Strader, [place] M, PA-C  HYDROcodone  bit-homatropine (HYCODAN) 5-1.5 MG/5ML syrup Take 5 mLs by mouth every 6 (six) hours as needed for cough. 07/03/23  Yes Meade Verdon RAMAN, MD  levalbuterol  (XOPENEX  HFA) 45 MCG/ACT inhaler Inhale 1 puff into the lungs every 4 (four) hours as needed for  wheezing. 07/26/23 07/25/24 Yes Meade Verdon RAMAN, MD  metoprolol  tartrate (LOPRESSOR ) 25 MG tablet Take 1 tablet (25 mg total) by mouth 2 (two) times daily. 11/17/21  Yes Strader, [place] M, PA-C  thiamine  (VITAMIN B-1) 100 MG tablet Take 100 mg by mouth daily.   Yes [provider]  apixaban  (ELIQUIS ) 5 MG TABS tablet Take 1 tablet (5 mg total) by mouth 2 (two) times daily. Okay to restart this medication on 08/29/2023 08/28/23   Artis Beggs S, MD  budesonide-glycopyrrolate-formoterol (BREZTRI  AEROSPHERE) 160-9-4.8 MCG/ACT AERO inhaler Inhale 2 puffs into the lungs in the morning and at bedtime. Patient not taking: Reported on 08/23/2023 06/20/23   Ruthell Lauraine FALCON, NP  ondansetron  (ZOFRAN ) 8 MG tablet Take 1 tablet (8 mg total) by mouth every 8 (eight) hours as needed (As needed for nausea starting 3 days after chemo). Patient not taking: Reported on 08/23/2023 07/07/23   Cloretta Arley NOVAK, MD  prochlorperazine  (COMPAZINE ) 10 MG tablet Take 1 tablet (10 mg total) by mouth every 6 (six) hours as needed for nausea or vomiting. Patient not taking: Reported on 08/23/2023 07/07/23   Cloretta Arley NOVAK, MD     Critical care time: NA      Lamar Chris, MD, PhD 08/28/2023, 4:22 PM [facility] [phone] or if no answer before 7:00PM call [phone] For any issues after 7:00PM please call eLink [phone]

## 2023-08-28 NOTE — Op Note (Signed)
 Procedure Note  Patient: Raymond Hurst  [employer] Cios mobile C-arm was utilized to identify and biopsy left upper lobe mass.  Needle-in-lesion was confirmed using real-time Cios imaging, and images were uploaded to PACS.   Lamar Chris, MD, PhD 08/28/2023, 3:26 PM [facility] [phone] or if no answer before 7:00PM call [phone] For any issues after 7:00PM please call eLink [phone]

## 2023-08-28 NOTE — Anesthesia Postprocedure Evaluation (Signed)
 Anesthesia Post Note  Patient: RASHAAN WYLES  Procedure(s) Performed: VIDEO BRONCHOSCOPY WITH ENDOBRONCHIAL NAVIGATION (Left) BRONCHOSCOPY, WITH NEEDLE ASPIRATION BIOPSY BRONCHOSCOPY, WITH EBUS CRYOTHERAPY     Patient location during evaluation: PACU Anesthesia Type: General Level of consciousness: awake and alert Pain management: pain level controlled Vital Signs Assessment: post-procedure vital signs reviewed and stable Respiratory status: spontaneous breathing, nonlabored ventilation, respiratory function stable and patient connected to nasal cannula oxygen Cardiovascular status: blood pressure returned to baseline and stable Postop Assessment: no apparent nausea or vomiting Anesthetic complications: no   No notable events documented.  Last Vitals:  Vitals:   08/28/23 1600 08/28/23 1645  BP: 112/69 118/77  Pulse: 91 83  Resp: 13 18  Temp:  36.6 C  SpO2: 97% 99%    Last Pain:  Vitals:   08/28/23 1550  TempSrc:   PainSc: 0-No pain                 Darice Vicario

## 2023-08-28 NOTE — Op Note (Signed)
 Video Bronchoscopy with Endobronchial Ultrasound and Electromagnetic Navigation Procedure Note  Date of Operation: 08/28/2023  Pre-op Diagnosis: Squamous cell lung cancer with left upper lobe mass  Post-op Diagnosis: Same  Surgeon: LAMAR CHRIS  Assistants: None  Anesthesia: General endotracheal anesthesia  Operation: Flexible video fiberoptic bronchoscopy with endobronchial ultrasound, robotic assisted navigation and biopsies.  Estimated Blood Loss: 25 cc  Complications: None apparent  Indications and History: Raymond Hurst is a 73 y.o. male with squamous cell lung cancer of the left upper lobe diagnosed in May 2025.  He has undergone chemoradiation.  Molecular studies have been performed but PD-L1 has not been obtained.  Recommendation was made to achieve a tissue diagnosis using endobronchial ultrasound and robotic assisted navigational bronchoscopy to facilitate PD-L1 testing. The risks, benefits, complications, treatment options and expected outcomes were discussed with the patient.  The possibilities of pneumothorax, pneumonia, reaction to medication, pulmonary aspiration, perforation of a viscus, bleeding, failure to diagnose a condition and creating a complication requiring transfusion or operation were discussed with the patient who freely signed the consent.    Description of Procedure: The patient was seen in the Preoperative Area, was examined and was deemed appropriate to proceed.  The patient was taken to [facility] Endoscopy room 3, identified as Raymond Hurst and the procedure verified as Flexible Video Fiberoptic Bronchoscopy with robotic assisted navigation and endobronchial ultrasound.  A Time Out was held and the above information confirmed.   Robotic assisted navigation: Prior to the date of the procedure a high-resolution CT scan of the chest was performed. Utilizing ION software program a virtual tracheobronchial tree was generated to allow the creation of distinct  navigation pathways to the patient's parenchymal abnormalities. After being taken to the operating room general anesthesia was initiated and the patient  was orally intubated. The video fiberoptic bronchoscope was introduced via the endotracheal tube and a general inspection was performed which showed normal right sided airways.  The left lower lobe airways were normal as well.  The distal left mainstem bronchus extending into the left upper lobe airways was edematous, hypervascular and irregular.  The segmental left upper lobe airways were narrowed but more easily passable than on his original bronchoscopy in May.  Aspiration of the bilateral mainstems was completed to remove any remaining secretions.  Endobronchial forceps biopsies were performed on the thickened left upper lobe segmental carinae to be sent for cytology.  1:10000 dilution epinephrine  was introduced, 4 cc total.  The samples were pooled with his EBUS cytology samples labeled left upper lobe.  Robotic catheter inserted into patient's endotracheal tube.   Target #1 left upper lobe mass: The distinct navigation pathways prepared prior to this procedure were then utilized to navigate to patient's lesion identified on CT scan. The robotic catheter was secured into place and the vision probe was withdrawn.  Lesion location was approximated using fluoroscopy.  Local registration and targeting was performed using [employer] Cios mobile C-arm three-dimensional imaging. Under fluoroscopic guidance transbronchial needle biopsies and transbronchial cryoprobe biopsies were performed to be sent for cytology and pathology.  Needle-in-lesion was confirmed using Cios mobile C-arm.    Endobronchial ultrasound: The robotic scope was then withdrawn and the endobronchial ultrasound was used to identify and characterize the peritracheal, hilar and mediastinal regions. Inspection showed an irregularly mass in the right upper lobe that was hypervascular.  Using real-time ultrasound guidance Wang needle biopsies were take from the right upper lobe mass and were sent for cytology.   At the  end of the procedure a general airway inspection was performed and there was no evidence of active bleeding. The bronchoscope was removed.  The patient tolerated the procedure well. There was no significant blood loss and there were no obvious complications. A post-procedural chest x-ray is pending.  Samples Target #1: 1. Transbronchial Wang needle biopsies from left upper lobe mass 2. Transbronchial cryoprobe biopsies from left upper lobe mass  EBUS Samples: 1. Wang needle biopsies from left upper lobe mass 2. Endobronchial biopsies from left upper lobe segmental carinae, pooled with EBUS samples   Lamar Chris, MD, PhD 08/28/2023, 3:13 PM [facility]

## 2023-08-28 NOTE — Anesthesia Procedure Notes (Signed)
 Procedure Name: Intubation Date/Time: 08/28/2023 1:47 PM  Performed by: Shlomo Tinnie SAILOR, RNPre-anesthesia Checklist: Patient identified, Patient being monitored, Timeout performed, Emergency Drugs available and Suction available Patient Re-evaluated:Patient Re-evaluated prior to induction Oxygen Delivery Method: Circle system utilized Preoxygenation: Pre-oxygenation with 100% oxygen Induction Type: IV induction Ventilation: Mask ventilation without difficulty Laryngoscope Size: McGrath and 4 Grade View: Grade I Tube type: Oral Tube size: 8.5 mm Number of attempts: 1 Airway Equipment and Method: Stylet Placement Confirmation: ETT inserted through vocal cords under direct vision, positive ETCO2 and breath sounds checked- equal and bilateral Secured at: 23 cm Tube secured with: Tape Dental Injury: Teeth and Oropharynx as per pre-operative assessment

## 2023-08-28 NOTE — Progress Notes (Signed)
 Dr. Mallory made aware that patient normally takes Metoprolol  BID but last dose was yesterday morning. Ok per Dr. Mallory. No new orders received. Dr. Mallory also made aware patients WBC count= 2.3.

## 2023-08-28 NOTE — Discharge Instructions (Addendum)
 Flexible Bronchoscopy, Care After This sheet gives you information about how to care for yourself after your test. Your doctor may also give you more specific instructions. If you have problems or questions, contact your doctor. Follow these instructions at home: Eating and drinking When you are wide awake, your numbness is gone and your cough and gag reflexes have come back, you may: Start eating only soft foods. Slowly drink liquids. Six hours after the test, go back to your normal diet. Driving Do not drive for 24 hours if you were given a medicine to help you relax (sedative). Do not drive or use heavy machinery while taking prescription pain medicine. General instructions Take over-the-counter and prescription medicines only as told by your doctor. Return to your normal activities as told. Ask what activities are safe for you. Do not use any products that have nicotine or tobacco in them. This includes cigarettes and e-cigarettes. If you need help quitting, ask your doctor. Keep all follow-up visits as told by your doctor. This is important. It is very important if you had a tissue sample (biopsy) taken. Get help right away if: You have shortness of breath that gets worse. You get light-headed. You feel like you are going to pass out (faint). You have chest pain. You cough up: More than a little blood. More blood than before. Summary Do not use cigarettes. Do not use e-cigarettes. Seek care in the Emergency Department right away if you have chest pain or shortness of breath. Call or MyChart Message our office for any questions or problems at [phone].  Okay to restart Eliquis  on 08/29/2023.   This information is not intended to replace advice given to you by your health care provider. Make sure you discuss any questions you have with your health care provider.

## 2023-08-29 ENCOUNTER — Other Ambulatory Visit: Payer: Self-pay

## 2023-08-29 ENCOUNTER — Ambulatory Visit

## 2023-08-29 ENCOUNTER — Observation Stay (HOSPITAL_COMMUNITY)

## 2023-08-29 ENCOUNTER — Ambulatory Visit
Admission: RE | Admit: 2023-08-29 | Discharge: 2023-08-29 | Disposition: A | Discharge status: Home / Self Care | Source: Ambulatory Visit | Attending: Radiation Oncology | Admitting: Radiation Oncology

## 2023-08-29 ENCOUNTER — Encounter (HOSPITAL_COMMUNITY): Payer: Self-pay | Admitting: Emergency Medicine

## 2023-08-29 DIAGNOSIS — F1090 Alcohol use, unspecified, uncomplicated: Secondary | ICD-10-CM | POA: Diagnosis not present

## 2023-08-29 DIAGNOSIS — J449 Chronic obstructive pulmonary disease, unspecified: Secondary | ICD-10-CM | POA: Diagnosis not present

## 2023-08-29 DIAGNOSIS — I517 Cardiomegaly: Secondary | ICD-10-CM | POA: Diagnosis not present

## 2023-08-29 DIAGNOSIS — C3412 Malignant neoplasm of upper lobe, left bronchus or lung: Secondary | ICD-10-CM | POA: Diagnosis not present

## 2023-08-29 DIAGNOSIS — M109 Gout, unspecified: Secondary | ICD-10-CM | POA: Diagnosis not present

## 2023-08-29 DIAGNOSIS — G4733 Obstructive sleep apnea (adult) (pediatric): Secondary | ICD-10-CM | POA: Diagnosis not present

## 2023-08-29 DIAGNOSIS — Z87891 Personal history of nicotine dependence: Secondary | ICD-10-CM | POA: Diagnosis not present

## 2023-08-29 DIAGNOSIS — J939 Pneumothorax, unspecified: Secondary | ICD-10-CM | POA: Diagnosis not present

## 2023-08-29 DIAGNOSIS — I4891 Unspecified atrial fibrillation: Secondary | ICD-10-CM | POA: Diagnosis not present

## 2023-08-29 DIAGNOSIS — C801 Malignant (primary) neoplasm, unspecified: Secondary | ICD-10-CM | POA: Diagnosis not present

## 2023-08-29 DIAGNOSIS — J9383 Other pneumothorax: Secondary | ICD-10-CM | POA: Diagnosis not present

## 2023-08-29 DIAGNOSIS — C7972 Secondary malignant neoplasm of left adrenal gland: Secondary | ICD-10-CM | POA: Diagnosis not present

## 2023-08-29 LAB — RAD ONC ARIA SESSION SUMMARY
Course Elapsed Days: 49
Plan Fractions Treated to Date: 8
Plan Prescribed Dose Per Fraction: 2 Gy
Plan Total Fractions Prescribed: 11
Plan Total Prescribed Dose: 22 Gy
Reference Point Dosage Given to Date: 16 Gy
Reference Point Session Dosage Given: 2 Gy
Session Number: 32

## 2023-08-29 NOTE — Progress Notes (Signed)
 AVS completed but not printed; informed by charge nurse patient maybe transferring to [facility].

## 2023-08-29 NOTE — Care Management Obs Status (Signed)
 MEDICARE OBSERVATION STATUS NOTIFICATION   Patient Details  Name: Raymond Hurst MRN: [id number] Date of Birth: [birth year]   Medicare Observation Status Notification Given:  Yes  Obs lettrer signed and copy given Claretta Deed 08/29/2023, 12:19 PM

## 2023-08-29 NOTE — Care Management Obs Status (Signed)
 MEDICARE OBSERVATION STATUS NOTIFICATION   Patient Details  Name: Raymond Hurst MRN: [id number] Date of Birth: 1951/01/30   Medicare Observation Status Notification Given:  Yes  Obs letter signed and copy given   Claretta Deed 08/29/2023, 12:38 PM

## 2023-08-29 NOTE — Progress Notes (Signed)
 Transition of Care [facility]) - Inpatient Brief Assessment   Patient Details  Name: Raymond Hurst MRN: [id number] Date of Birth: 15-Jun-1950  Transition of Care [facility]) CM/SW Contact:    Rosaline JONELLE Joe, RN Phone Number: 08/29/2023, 9:39 AM   Clinical Narrative: Patient admitted for Lung cancer in upper lobe.  No IP Care management needs at this time and plans are to discharge home today.   Transition of Care Asessment: Insurance and Status: (P) Insurance coverage has been reviewed Patient has primary care physician: (P) Yes Home environment has been reviewed: (P) from home Prior level of function:: (P) self Prior/Current Home Services: (P) No current home services Social Drivers of Health Review: (P) SDOH reviewed no interventions necessary Readmission risk has been reviewed: (P) Yes Transition of care needs: (P) no transition of care needs at this time

## 2023-08-29 NOTE — Progress Notes (Signed)
 RN went over AVS with patient. Patient verbalized understanding of all teaching. RN let patient know about appointment at 5pm at [facility]. Patient stated wife was picking him up around 2pm and taking him to the appointment. RN removed IV. No meds ordered.

## 2023-08-29 NOTE — Discharge Summary (Signed)
 Physician Discharge Summary  Patient ID: Raymond Hurst MRN: [id number] DOB/AGE: 71-Dec-1952 71 y.o.  Admit date: 08/28/2023 Discharge date: 08/29/2023  Admission Diagnoses:  Discharge Diagnoses:  Principal Problem:   Lung cancer, upper lobe [facility]) Active Problems:   Pneumothorax after biopsy   Discharged Condition: good  Hospital Course:  Patient underwent navigational bronchoscopy with left upper lobe biopsies to evaluate squamous cell lung cancer and to obtain additional tissue for molecular testing.  Postprocedure chest x-ray showed a possible loculated pneumothorax.  Patient was asymptomatic.  He was admitted for observation, followed with serial chest x-rays which did not show definitive pneumothorax in retrospect.  He will be discharged home on 7/29 to follow-up with oncology and to follow-up with his bronchoscopy biopsies next week.  Consults: None  Significant Diagnostic Studies: Chest x-ray 7/28 postprocedure with possible left pneumothorax.  Follow-up chest x-ray 7/28 and 7/29 less convincing for pneumothorax, either not present or resolved.  Treatments: Observation.  No intervention required  Discharge Exam: Blood pressure 102/64, pulse 86, temperature 97.8 F (36.6 C), resp. rate 17, height 6' (1.829 m), weight 120.3 kg, SpO2 96%. Gen: Pleasant, obese man, in no distress,  normal affect  ENT: No lesions,  mouth clear,  oropharynx clear, no postnasal drip  Neck: No JVD, no stridor  Lungs: No use of accessory muscles, distant bilaterally, better breath sounds on the right than on the left  Cardiovascular: RRR, heart sounds normal, no murmur or gallops, 1-2+ peripheral edema with venous stasis changes  Abdomen: soft and NT, no HSM,  BS normal  Musculoskeletal: No deformities, no cyanosis or clubbing  Neuro: alert, awake, non focal  Skin: Warm, no lesions or rashes     Disposition: Discharge disposition: 01-Home or Self Care       Discharge Instructions      Diet - low sodium heart healthy   Complete by: As directed    Discharge patient   Complete by: As directed    Discharge disposition: 01-Home or Self Care   Discharge patient date: 08/29/2023   Increase activity slowly   Complete by: As directed       Allergies as of 08/29/2023   No Known Allergies      Medication List     TAKE these medications    acetaminophen  500 MG tablet Commonly known as: TYLENOL  Take 500 mg by mouth every 6 (six) hours as needed for mild pain.   allopurinol  100 MG tablet Commonly known as: ZYLOPRIM  Take 100 mg by mouth daily.   apixaban  5 MG Tabs tablet Commonly known as: ELIQUIS  Take 1 tablet (5 mg total) by mouth 2 (two) times daily. Okay to restart this medication on 08/29/2023 What changed: additional instructions   Breztri  Aerosphere 160-9-4.8 MCG/ACT Aero inhaler Generic drug: budesonide-glycopyrrolate-formoterol Inhale 2 puffs into the lungs in the morning and at bedtime.   cholecalciferol  25 MCG (1000 UNIT) tablet Commonly known as: VITAMIN D3 Take 1,000 Units by mouth daily.   cyanocobalamin  100 MCG tablet Commonly known as: VITAMIN B12 Take 100 mcg by mouth daily.   furosemide  40 MG tablet Commonly known as: LASIX  Take 40 mg by mouth daily.   HYDROcodone  bit-homatropine 5-1.5 MG/5ML syrup Commonly known as: HYCODAN Take 5 mLs by mouth every 6 (six) hours as needed for cough.   levalbuterol  45 MCG/ACT inhaler Commonly known as: XOPENEX  HFA Inhale 1 puff into the lungs every 4 (four) hours as needed for wheezing.   metoprolol  tartrate 25 MG tablet Commonly known as:  LOPRESSOR  Take 1 tablet (25 mg total) by mouth 2 (two) times daily.   ondansetron  8 MG tablet Commonly known as: ZOFRAN  Take 1 tablet (8 mg total) by mouth every 8 (eight) hours as needed (As needed for nausea starting 3 days after chemo).   prochlorperazine  10 MG tablet Commonly known as: COMPAZINE  Take 1 tablet (10 mg total) by mouth every 6 (six) hours  as needed for nausea or vomiting.   thiamine  100 MG tablet Commonly known as: Vitamin B-1 Take 100 mg by mouth daily.         Signed: Lamar GORMAN Chris 08/29/2023, 9:26 AM

## 2023-08-30 ENCOUNTER — Ambulatory Visit
Admission: RE | Admit: 2023-08-30 | Discharge: 2023-08-30 | Discharge status: Home / Self Care | Source: Ambulatory Visit | Attending: Radiation Oncology

## 2023-08-30 ENCOUNTER — Other Ambulatory Visit: Payer: Self-pay

## 2023-08-30 ENCOUNTER — Ambulatory Visit

## 2023-08-30 ENCOUNTER — Other Ambulatory Visit: Payer: Self-pay | Admitting: *Deleted

## 2023-08-30 ENCOUNTER — Telehealth: Payer: Self-pay | Admitting: Dietician

## 2023-08-30 DIAGNOSIS — C7972 Secondary malignant neoplasm of left adrenal gland: Secondary | ICD-10-CM | POA: Diagnosis not present

## 2023-08-30 DIAGNOSIS — C801 Malignant (primary) neoplasm, unspecified: Secondary | ICD-10-CM | POA: Diagnosis not present

## 2023-08-30 DIAGNOSIS — C3412 Malignant neoplasm of upper lobe, left bronchus or lung: Secondary | ICD-10-CM

## 2023-08-30 LAB — RAD ONC ARIA SESSION SUMMARY
Course Elapsed Days: 50
Plan Fractions Treated to Date: 9
Plan Prescribed Dose Per Fraction: 2 Gy
Plan Total Fractions Prescribed: 11
Plan Total Prescribed Dose: 22 Gy
Reference Point Dosage Given to Date: 18 Gy
Reference Point Session Dosage Given: 2 Gy
Session Number: 33

## 2023-08-30 LAB — CYTOLOGY - NON PAP

## 2023-08-30 NOTE — Telephone Encounter (Signed)
 Called patient he stated want to cancel the referral and he was going to find about the nutrition.

## 2023-08-31 ENCOUNTER — Other Ambulatory Visit: Payer: Self-pay

## 2023-08-31 ENCOUNTER — Ambulatory Visit
Admission: RE | Admit: 2023-08-31 | Discharge: 2023-08-31 | Discharge status: Home / Self Care | Source: Ambulatory Visit | Attending: Radiation Oncology

## 2023-08-31 ENCOUNTER — Encounter: Payer: Self-pay | Admitting: *Deleted

## 2023-08-31 ENCOUNTER — Ambulatory Visit

## 2023-08-31 ENCOUNTER — Inpatient Hospital Stay

## 2023-08-31 ENCOUNTER — Other Ambulatory Visit

## 2023-08-31 DIAGNOSIS — C3412 Malignant neoplasm of upper lobe, left bronchus or lung: Secondary | ICD-10-CM | POA: Diagnosis not present

## 2023-08-31 DIAGNOSIS — C7972 Secondary malignant neoplasm of left adrenal gland: Secondary | ICD-10-CM | POA: Diagnosis not present

## 2023-08-31 DIAGNOSIS — C801 Malignant (primary) neoplasm, unspecified: Secondary | ICD-10-CM | POA: Diagnosis not present

## 2023-08-31 LAB — CBC WITH DIFFERENTIAL (CANCER CENTER ONLY)
Abs Granulocyte: 3 K/uL (ref 1.5–6.5)
Abs Immature Granulocytes: 0.02 K/uL (ref 0.00–0.07)
Basophils Absolute: 0 K/uL (ref 0.0–0.1)
Basophils Relative: 1 %
Eosinophils Absolute: 0 K/uL (ref 0.0–0.5)
Eosinophils Relative: 1 %
HCT: 32.6 % — ABNORMAL LOW (ref 39.0–52.0)
Hemoglobin: 10.7 g/dL — ABNORMAL LOW (ref 13.0–17.0)
Immature Granulocytes: 1 %
Lymphocytes Relative: 7 %
Lymphs Abs: 0.3 K/uL — ABNORMAL LOW (ref 0.7–4.0)
MCH: 28.7 pg (ref 26.0–34.0)
MCHC: 32.8 g/dL (ref 30.0–36.0)
MCV: 87.4 fL (ref 80.0–100.0)
Monocytes Absolute: 0.3 K/uL (ref 0.1–1.0)
Monocytes Relative: 9 %
Neutro Abs: 3 K/uL (ref 1.7–7.7)
Neutrophils Relative %: 81 %
Platelet Count: 157 K/uL (ref 150–400)
RBC: 3.73 MIL/uL — ABNORMAL LOW (ref 4.22–5.81)
RDW: 21.2 % — ABNORMAL HIGH (ref 11.5–15.5)
Smear Review: NORMAL
WBC Count: 3.7 K/uL — ABNORMAL LOW (ref 4.0–10.5)
nRBC: 0 % (ref 0.0–0.2)

## 2023-08-31 LAB — RAD ONC ARIA SESSION SUMMARY
Course Elapsed Days: 51
Plan Fractions Treated to Date: 10
Plan Prescribed Dose Per Fraction: 2 Gy
Plan Total Fractions Prescribed: 11
Plan Total Prescribed Dose: 22 Gy
Reference Point Dosage Given to Date: 20 Gy
Reference Point Session Dosage Given: 2 Gy
Session Number: 34

## 2023-08-31 LAB — CYTOLOGY - NON PAP

## 2023-08-31 NOTE — Progress Notes (Signed)
 PD-1 testing requested on accession number [id number]

## 2023-09-01 ENCOUNTER — Ambulatory Visit

## 2023-09-01 ENCOUNTER — Ambulatory Visit: Payer: Self-pay | Admitting: Oncology

## 2023-09-01 ENCOUNTER — Other Ambulatory Visit: Payer: Self-pay

## 2023-09-01 ENCOUNTER — Ambulatory Visit
Admission: RE | Admit: 2023-09-01 | Discharge: 2023-09-01 | Disposition: A | Discharge status: Home / Self Care | Source: Ambulatory Visit | Attending: Radiation Oncology | Admitting: Radiation Oncology

## 2023-09-01 DIAGNOSIS — C3412 Malignant neoplasm of upper lobe, left bronchus or lung: Secondary | ICD-10-CM | POA: Diagnosis not present

## 2023-09-01 DIAGNOSIS — C7972 Secondary malignant neoplasm of left adrenal gland: Secondary | ICD-10-CM | POA: Insufficient documentation

## 2023-09-01 DIAGNOSIS — C801 Malignant (primary) neoplasm, unspecified: Secondary | ICD-10-CM | POA: Insufficient documentation

## 2023-09-01 LAB — RAD ONC ARIA SESSION SUMMARY
Course Elapsed Days: 52
Plan Fractions Treated to Date: 11
Plan Prescribed Dose Per Fraction: 2 Gy
Plan Total Fractions Prescribed: 11
Plan Total Prescribed Dose: 22 Gy
Reference Point Dosage Given to Date: 22 Gy
Reference Point Session Dosage Given: 2 Gy
Session Number: 35

## 2023-09-01 NOTE — Telephone Encounter (Signed)
-----   Message from Arley Hof sent at 09/01/2023  7:31 AM EDT ----- Please call patient, the CBC looks good, follow-up as scheduled  ----- Message ----- From: Rebecka, Lab In [place] Sent: 08/31/2023   5:05 PM EDT To: Arley KATHEE Hof, MD

## 2023-09-01 NOTE — Telephone Encounter (Signed)
 Patient gave verbal understanding and had no further questions or conerns

## 2023-09-04 ENCOUNTER — Ambulatory Visit

## 2023-09-04 NOTE — Radiation Completion Notes (Addendum)
  Radiation Oncology         [phone] ________________________________  Name: Raymond Hurst MRN: [id number]  Date: 09/01/2023  DOB: Apr 14, 1950  Referring Physician: MARGARETE HURON, M.D. Date of Service: 2023-09-04 Radiation Oncologist: Adina Barge, M.D. 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   Intent: Curative     ==========DELIVERED PLANS==========  First Treatment Date: 2023-07-11 Last Treatment Date: 2023-09-01   Plan Name: Lung_L Site: Bronchus, Left Technique: 3D Mode: Photon Dose Per Fraction: 2 Gy Prescribed Dose (Delivered / Prescribed): 48 Gy / 70 Gy Prescribed Fxs (Delivered / Prescribed): 24 / 35   Plan Name: Adren_L_SBRT Site: Adrenal Gland, Left Technique: SBRT/SRT-IMRT Mode: Photon Dose Per Fraction: 10 Gy Prescribed Dose (Delivered / Prescribed): 50 Gy / 50 Gy Prescribed Fxs (Delivered / Prescribed): 5 / 5   Plan Name: Lung_L_Bst Site: Bronchus, Left Technique: 3D Mode: Photon Dose Per Fraction: 2 Gy Prescribed Dose (Delivered / Prescribed): 22 Gy / 22 Gy Prescribed Fxs (Delivered / Prescribed): 11 / 11     ==========ON TREATMENT VISIT DATES========== 2023-07-14, 2023-07-21, 2023-07-28, 2023-08-01, 2023-08-02, 2023-08-03, 2023-08-07, 2023-08-14, 2023-08-17, 2023-08-24, 2023-09-01   See weekly On Treatment Notes in Epic for details in the Media tab (listed as Progress notes on the On Treatment Visit Dates listed above).  The patient will receive a call in about one month from the radiation oncology department. He will continue follow up with his medical oncologist, Dr. Cloretta, as well.  ------------------------------------------------   Donnice Barge, MD [facility]  Radiation Oncology Direct Dial: [phone]  Fax: [phone] .com  Skype  LinkedIn

## 2023-09-06 ENCOUNTER — Ambulatory Visit: Admitting: Acute Care

## 2023-09-06 ENCOUNTER — Telehealth: Payer: Self-pay | Admitting: Acute Care

## 2023-09-06 ENCOUNTER — Encounter: Payer: Self-pay | Admitting: Acute Care

## 2023-09-06 NOTE — Telephone Encounter (Signed)
 Pt. Was a no show for his bronch follow up visit. It looks like he already has been seen by radiation oncology, and has had his first treatment. We did try to call him this morning, but there was no answer.

## 2023-09-07 ENCOUNTER — Other Ambulatory Visit: Payer: Self-pay

## 2023-09-07 ENCOUNTER — Emergency Department (HOSPITAL_COMMUNITY)

## 2023-09-07 ENCOUNTER — Encounter (HOSPITAL_COMMUNITY): Payer: Self-pay

## 2023-09-07 ENCOUNTER — Inpatient Hospital Stay (HOSPITAL_COMMUNITY)
Admission: EM | Admit: 2023-09-07 | Discharge: 2023-09-11 | DRG: 981 | Disposition: A | Discharge status: Home / Self Care | Attending: Internal Medicine | Admitting: Internal Medicine

## 2023-09-07 ENCOUNTER — Telehealth: Payer: Self-pay | Admitting: *Deleted

## 2023-09-07 DIAGNOSIS — G4733 Obstructive sleep apnea (adult) (pediatric): Secondary | ICD-10-CM | POA: Diagnosis present

## 2023-09-07 DIAGNOSIS — Z7901 Long term (current) use of anticoagulants: Secondary | ICD-10-CM | POA: Diagnosis not present

## 2023-09-07 DIAGNOSIS — K922 Gastrointestinal hemorrhage, unspecified: Secondary | ICD-10-CM | POA: Diagnosis not present

## 2023-09-07 DIAGNOSIS — C3410 Malignant neoplasm of upper lobe, unspecified bronchus or lung: Secondary | ICD-10-CM | POA: Diagnosis present

## 2023-09-07 DIAGNOSIS — Z85118 Personal history of other malignant neoplasm of bronchus and lung: Secondary | ICD-10-CM

## 2023-09-07 DIAGNOSIS — Z8249 Family history of ischemic heart disease and other diseases of the circulatory system: Secondary | ICD-10-CM | POA: Diagnosis not present

## 2023-09-07 DIAGNOSIS — E876 Hypokalemia: Secondary | ICD-10-CM | POA: Diagnosis present

## 2023-09-07 DIAGNOSIS — Z87891 Personal history of nicotine dependence: Secondary | ICD-10-CM | POA: Diagnosis not present

## 2023-09-07 DIAGNOSIS — C3412 Malignant neoplasm of upper lobe, left bronchus or lung: Secondary | ICD-10-CM | POA: Diagnosis not present

## 2023-09-07 DIAGNOSIS — K260 Acute duodenal ulcer with hemorrhage: Principal | ICD-10-CM | POA: Diagnosis present

## 2023-09-07 DIAGNOSIS — R231 Pallor: Secondary | ICD-10-CM | POA: Diagnosis not present

## 2023-09-07 DIAGNOSIS — D6832 Hemorrhagic disorder due to extrinsic circulating anticoagulants: Secondary | ICD-10-CM | POA: Diagnosis not present

## 2023-09-07 DIAGNOSIS — Z79899 Other long term (current) drug therapy: Secondary | ICD-10-CM | POA: Diagnosis not present

## 2023-09-07 DIAGNOSIS — Z8673 Personal history of transient ischemic attack (TIA), and cerebral infarction without residual deficits: Secondary | ICD-10-CM

## 2023-09-07 DIAGNOSIS — F1010 Alcohol abuse, uncomplicated: Secondary | ICD-10-CM | POA: Diagnosis present

## 2023-09-07 DIAGNOSIS — K269 Duodenal ulcer, unspecified as acute or chronic, without hemorrhage or perforation: Secondary | ICD-10-CM | POA: Diagnosis not present

## 2023-09-07 DIAGNOSIS — D649 Anemia, unspecified: Secondary | ICD-10-CM | POA: Diagnosis present

## 2023-09-07 DIAGNOSIS — I10 Essential (primary) hypertension: Secondary | ICD-10-CM | POA: Diagnosis not present

## 2023-09-07 DIAGNOSIS — J9601 Acute respiratory failure with hypoxia: Secondary | ICD-10-CM | POA: Diagnosis not present

## 2023-09-07 DIAGNOSIS — K264 Chronic or unspecified duodenal ulcer with hemorrhage: Secondary | ICD-10-CM | POA: Diagnosis not present

## 2023-09-07 DIAGNOSIS — R1111 Vomiting without nausea: Secondary | ICD-10-CM | POA: Diagnosis not present

## 2023-09-07 DIAGNOSIS — Z5986 Financial insecurity: Secondary | ICD-10-CM

## 2023-09-07 DIAGNOSIS — I890 Lymphedema, not elsewhere classified: Secondary | ICD-10-CM | POA: Diagnosis present

## 2023-09-07 DIAGNOSIS — R1110 Vomiting, unspecified: Secondary | ICD-10-CM | POA: Diagnosis not present

## 2023-09-07 DIAGNOSIS — I444 Left anterior fascicular block: Secondary | ICD-10-CM | POA: Diagnosis present

## 2023-09-07 DIAGNOSIS — D62 Acute posthemorrhagic anemia: Secondary | ICD-10-CM | POA: Diagnosis present

## 2023-09-07 DIAGNOSIS — Z6835 Body mass index (BMI) 35.0-35.9, adult: Secondary | ICD-10-CM | POA: Diagnosis not present

## 2023-09-07 DIAGNOSIS — K254 Chronic or unspecified gastric ulcer with hemorrhage: Secondary | ICD-10-CM | POA: Diagnosis not present

## 2023-09-07 DIAGNOSIS — I480 Paroxysmal atrial fibrillation: Secondary | ICD-10-CM | POA: Diagnosis present

## 2023-09-07 DIAGNOSIS — R918 Other nonspecific abnormal finding of lung field: Secondary | ICD-10-CM | POA: Diagnosis not present

## 2023-09-07 DIAGNOSIS — M109 Gout, unspecified: Secondary | ICD-10-CM | POA: Diagnosis present

## 2023-09-07 DIAGNOSIS — E66813 Obesity, class 3: Secondary | ICD-10-CM | POA: Diagnosis not present

## 2023-09-07 DIAGNOSIS — R609 Edema, unspecified: Secondary | ICD-10-CM | POA: Diagnosis not present

## 2023-09-07 DIAGNOSIS — R58 Hemorrhage, not elsewhere classified: Secondary | ICD-10-CM | POA: Diagnosis not present

## 2023-09-07 DIAGNOSIS — J811 Chronic pulmonary edema: Secondary | ICD-10-CM | POA: Diagnosis not present

## 2023-09-07 DIAGNOSIS — J90 Pleural effusion, not elsewhere classified: Secondary | ICD-10-CM | POA: Diagnosis not present

## 2023-09-07 DIAGNOSIS — K921 Melena: Secondary | ICD-10-CM | POA: Diagnosis not present

## 2023-09-07 DIAGNOSIS — Z825 Family history of asthma and other chronic lower respiratory diseases: Secondary | ICD-10-CM | POA: Diagnosis not present

## 2023-09-07 DIAGNOSIS — Z9221 Personal history of antineoplastic chemotherapy: Secondary | ICD-10-CM | POA: Diagnosis not present

## 2023-09-07 DIAGNOSIS — K920 Hematemesis: Secondary | ICD-10-CM | POA: Diagnosis not present

## 2023-09-07 LAB — CBC WITH DIFFERENTIAL/PLATELET
Abs Immature Granulocytes: 0.02 K/uL (ref 0.00–0.07)
Basophils Absolute: 0 K/uL (ref 0.0–0.1)
Basophils Relative: 1 %
Eosinophils Absolute: 0.1 K/uL (ref 0.0–0.5)
Eosinophils Relative: 1 %
HCT: 21 % — ABNORMAL LOW (ref 39.0–52.0)
Hemoglobin: 6.7 g/dL — CL (ref 13.0–17.0)
Immature Granulocytes: 0 %
Lymphocytes Relative: 7 %
Lymphs Abs: 0.4 K/uL — ABNORMAL LOW (ref 0.7–4.0)
MCH: 29.8 pg (ref 26.0–34.0)
MCHC: 31.9 g/dL (ref 30.0–36.0)
MCV: 93.3 fL (ref 80.0–100.0)
Monocytes Absolute: 0.4 K/uL (ref 0.1–1.0)
Monocytes Relative: 8 %
Neutro Abs: 4.4 K/uL (ref 1.7–7.7)
Neutrophils Relative %: 83 %
Platelets: 194 K/uL (ref 150–400)
RBC: 2.25 MIL/uL — ABNORMAL LOW (ref 4.22–5.81)
RDW: 22.9 % — ABNORMAL HIGH (ref 11.5–15.5)
WBC: 5.3 K/uL (ref 4.0–10.5)
nRBC: 0 % (ref 0.0–0.2)

## 2023-09-07 LAB — LACTIC ACID, PLASMA: Lactic Acid, Venous: 2.6 mmol/L (ref 0.5–1.9)

## 2023-09-07 LAB — COMPREHENSIVE METABOLIC PANEL WITH GFR
ALT: 8 U/L (ref 0–44)
AST: 14 U/L — ABNORMAL LOW (ref 15–41)
Albumin: 2.5 g/dL — ABNORMAL LOW (ref 3.5–5.0)
Alkaline Phosphatase: 42 U/L (ref 38–126)
Anion gap: 13 (ref 5–15)
BUN: 22 mg/dL (ref 8–23)
CO2: 20 mmol/L — ABNORMAL LOW (ref 22–32)
Calcium: 8.1 mg/dL — ABNORMAL LOW (ref 8.9–10.3)
Chloride: 106 mmol/L (ref 98–111)
Creatinine, Ser: 0.79 mg/dL (ref 0.61–1.24)
GFR, Estimated: >60 mL/min (ref >60)
Glucose, Bld: 177 mg/dL — ABNORMAL HIGH (ref 70–99)
Potassium: 3.3 mmol/L — ABNORMAL LOW (ref 3.5–5.1)
Sodium: 139 mmol/L (ref 135–145)
Total Bilirubin: 0.6 mg/dL (ref 0.0–1.2)
Total Protein: 5.2 g/dL — ABNORMAL LOW (ref 6.5–8.1)

## 2023-09-07 LAB — PROTIME-INR
INR: 1.3 — ABNORMAL HIGH (ref 0.8–1.2)
Prothrombin Time: 16.6 s — ABNORMAL HIGH (ref 11.4–15.2)

## 2023-09-07 LAB — PREPARE RBC (CROSSMATCH)

## 2023-09-07 LAB — LIPASE, BLOOD: Lipase: 28 U/L (ref 11–51)

## 2023-09-07 MED ORDER — POTASSIUM CHLORIDE 10 MEQ/100ML IV SOLN
10.0000 meq | INTRAVENOUS | Status: AC
  Administered 2023-09-07 – 2023-09-08 (×3): 10 meq via INTRAVENOUS
  Filled 2023-09-07: qty 100

## 2023-09-07 MED ORDER — CHLORHEXIDINE GLUCONATE CLOTH 2 % EX PADS
6.0000 | MEDICATED_PAD | Freq: Every day | CUTANEOUS | Status: DC
  Administered 2023-09-07 – 2023-09-10 (×3): 6 via TOPICAL

## 2023-09-07 MED ORDER — ONDANSETRON HCL 4 MG/2ML IJ SOLN
4.0000 mg | Freq: Four times a day (QID) | INTRAMUSCULAR | Status: DC | PRN
  Administered 2023-09-08 (×2): 4 mg via INTRAVENOUS
  Filled 2023-09-07 (×2): qty 2

## 2023-09-07 MED ORDER — THIAMINE MONONITRATE 100 MG PO TABS
100.0000 mg | ORAL_TABLET | Freq: Every day | ORAL | Status: DC
  Administered 2023-09-08 – 2023-09-11 (×4): 100 mg via ORAL
  Filled 2023-09-07 (×3): qty 1

## 2023-09-07 MED ORDER — POLYETHYLENE GLYCOL 3350 17 G PO PACK
17.0000 g | PACK | Freq: Every day | ORAL | Status: DC | PRN
Start: 2023-09-07 — End: 2023-09-11

## 2023-09-07 MED ORDER — LORAZEPAM 2 MG/ML IJ SOLN: 1.0000 mg | INTRAMUSCULAR | Status: AC | PRN

## 2023-09-07 MED ORDER — FOLIC ACID 1 MG PO TABS
1.0000 mg | ORAL_TABLET | Freq: Every day | ORAL | Status: DC
  Administered 2023-09-08 – 2023-09-11 (×4): 1 mg via ORAL
  Filled 2023-09-07 (×3): qty 1

## 2023-09-07 MED ORDER — PANTOPRAZOLE SODIUM 40 MG IV SOLR
80.0000 mg | INTRAVENOUS | Status: AC
  Administered 2023-09-07: 80 mg via INTRAVENOUS
  Filled 2023-09-07: qty 20

## 2023-09-07 MED ORDER — LORAZEPAM 1 MG PO TABS: 1.0000 mg | ORAL_TABLET | ORAL | Status: AC | PRN

## 2023-09-07 MED ORDER — THIAMINE HCL 100 MG/ML IJ SOLN
100.0000 mg | Freq: Every day | INTRAMUSCULAR | Status: DC
  Administered 2023-09-09: 100 mg via INTRAVENOUS
  Filled 2023-09-07 (×2): qty 2

## 2023-09-07 MED ORDER — PANTOPRAZOLE SODIUM 40 MG IV SOLR
40.0000 mg | Freq: Two times a day (BID) | INTRAVENOUS | Status: DC
  Administered 2023-09-08 (×2): 40 mg via INTRAVENOUS
  Filled 2023-09-07 (×2): qty 10

## 2023-09-07 MED ORDER — ACETAMINOPHEN 650 MG RE SUPP: 650.0000 mg | Freq: Four times a day (QID) | RECTAL | Status: DC | PRN

## 2023-09-07 MED ORDER — OCTREOTIDE LOAD VIA INFUSION
100.0000 ug | Freq: Once | INTRAVENOUS | Status: AC
  Administered 2023-09-07: 100 ug via INTRAVENOUS
  Filled 2023-09-07: qty 50

## 2023-09-07 MED ORDER — ACETAMINOPHEN 325 MG PO TABS: 650.0000 mg | ORAL_TABLET | Freq: Four times a day (QID) | ORAL | Status: DC | PRN

## 2023-09-07 MED ORDER — ONDANSETRON HCL 4 MG PO TABS: 4.0000 mg | ORAL_TABLET | Freq: Four times a day (QID) | ORAL | Status: DC | PRN

## 2023-09-07 MED ORDER — ADULT MULTIVITAMIN W/MINERALS CH
1.0000 | ORAL_TABLET | Freq: Every day | ORAL | Status: DC
  Administered 2023-09-08 – 2023-09-11 (×4): 1 via ORAL
  Filled 2023-09-07 (×3): qty 1

## 2023-09-07 MED ORDER — SODIUM CHLORIDE 0.9 % IV SOLN
50.0000 ug/h | INTRAVENOUS | Status: DC
  Administered 2023-09-07 – 2023-09-09 (×3): 50 ug/h via INTRAVENOUS
  Filled 2023-09-07 (×2): qty 5
  Filled 2023-09-07: qty 1
  Filled 2023-09-07 (×3): qty 5
  Filled 2023-09-07 (×2): qty 1

## 2023-09-07 MED ORDER — SODIUM CHLORIDE 0.9% IV SOLUTION: Freq: Once | INTRAVENOUS | Status: AC

## 2023-09-07 NOTE — ED Triage Notes (Signed)
 Rcems from home. Cc of rectal bleeding that started yesterday at 2pm. When EMS arrived he had bright red bloody emesis. His BP was 90s systolic . So they gave 500ml NS in an 18g R AC and 4mg  Zofran  IV. 206 CBG. Takes Eliquis . Stage 4 lung cancer

## 2023-09-07 NOTE — Assessment & Plan Note (Signed)
 Previously heavy drinker, has cut down.  His last drink was yesterday, drank 1- 12 ounce beer.  No history of seizures.  Denies history of alcohol withdrawals. -CIWA as needed -Thiamine , folate multivitamins when able

## 2023-09-07 NOTE — Assessment & Plan Note (Signed)
 Follows with Dr. Cloretta.  Diagnosed 06/2023.  Per last notes 08/23/2023-patient completed last cycle of chemotherapy 7/23 and radiation 8/1.

## 2023-09-07 NOTE — Assessment & Plan Note (Signed)
Hold allopurinol while n.p.o.

## 2023-09-07 NOTE — Assessment & Plan Note (Signed)
 Stable and at baseline

## 2023-09-07 NOTE — Telephone Encounter (Signed)
 Informed wife for him to come tomorrow at 0930 for lab/OV. IF condition worsens, please go to emergency room. She understands and agrees.

## 2023-09-07 NOTE — ED Provider Notes (Signed)
 [place] EMERGENCY DEPARTMENT AT [facility] Provider Note   CSN: [id number] Arrival date & time: 09/07/23  2031     Patient presents with: No chief complaint on file.   Raymond Hurst is a 73 y.o. male.   HPI This patient is a 73 year old male, he has a history of stage IV lung cancer currently finishing up some chemo and radiation and following with oncology.  He also has a history of lymphedema of his legs and heavy alcohol intake as well as atrial fibrillation.  He does report that he is on Eliquis  twice a day, he reports that his alcohol intake is cut down to just drinking a sixpack on the weekends ever since he started his cancer treatments.  3 days ago he started to notice black stools, last night he started having black diarrhea and today at 11:30 in the morning he started having bright red blood per bowel movement.  When the paramedics came to pick the patient up his blood pressure was 90 systolic and he started to vomit blood from his mouth.  He is not coughing he is not short of breath he is not having fevers or chills.  The paramedics gave him 500 cc of normal saline prehospital    Prior to Admission medications   Medication Sig Start Date End Date Taking? Authorizing Provider  acetaminophen  (TYLENOL ) 500 MG tablet Take 500 mg by mouth every 6 (six) hours as needed for mild pain.   Yes [provider]  allopurinol  (ZYLOPRIM ) 100 MG tablet Take 100 mg by mouth daily. 05/25/22  Yes [provider]  apixaban  (ELIQUIS ) 5 MG TABS tablet Take 1 tablet (5 mg total) by mouth 2 (two) times daily. Okay to restart this medication on 08/29/2023 08/28/23  Yes Shelah Lamar RAMAN, MD  cholecalciferol  (VITAMIN D3) 25 MCG (1000 UNIT) tablet Take 1,000 Units by mouth daily.   Yes [provider]  cyanocobalamin  (VITAMIN B12) 100 MCG tablet Take 100 mcg by mouth daily.   Yes [provider]  furosemide  (LASIX ) 40 MG tablet Take 20 mg by mouth daily.   Yes  [provider]  HYDROcodone  bit-homatropine (HYCODAN) 5-1.5 MG/5ML syrup Take 5 mLs by mouth every 6 (six) hours as needed for cough. 07/03/23  Yes Meade Verdon RAMAN, MD  levalbuterol  (XOPENEX  HFA) 45 MCG/ACT inhaler Inhale 1 puff into the lungs every 4 (four) hours as needed for wheezing. 07/26/23 07/25/24 Yes Meade Verdon RAMAN, MD  loperamide (IMODIUM A-D) 2 MG tablet Take 2-4 mg by mouth 4 (four) times daily as needed for diarrhea or loose stools.   Yes [provider]  metoprolol  tartrate (LOPRESSOR ) 25 MG tablet Take 1 tablet (25 mg total) by mouth 2 (two) times daily. 11/17/21  Yes Strader, [place] M, PA-C  ondansetron  (ZOFRAN ) 8 MG tablet Take 1 tablet (8 mg total) by mouth every 8 (eight) hours as needed (As needed for nausea starting 3 days after chemo). 07/07/23  Yes Cloretta Arley NOVAK, MD  prochlorperazine  (COMPAZINE ) 10 MG tablet Take 1 tablet (10 mg total) by mouth every 6 (six) hours as needed for nausea or vomiting. 07/07/23  Yes Cloretta Arley NOVAK, MD  simethicone (MYLICON) 125 MG chewable tablet Chew 125 mg by mouth every 6 (six) hours as needed for flatulence.   Yes [provider]  thiamine  (VITAMIN B-1) 100 MG tablet Take 100 mg by mouth daily.   Yes [provider]    Allergies: Patient has no known allergies.  Review of Systems  All other systems reviewed and are negative.   Updated Vital Signs BP 94/64   Pulse (!) 101   Resp (!) 21   Ht 1.829 m (6')   Wt 120 kg   SpO2 99%   BMI 35.88 kg/m   Physical Exam Vitals and nursing note reviewed.  Constitutional:      General: He is in acute distress.     Appearance: He is well-developed. He is ill-appearing.  HENT:     Head: Normocephalic and atraumatic.     Mouth/Throat:     Comments: Pale mucous membranes, dried blood in the oropharynx Eyes:     General: No scleral icterus.       Right eye: No discharge.        Left eye: No discharge.     Pupils: Pupils are equal, round, and reactive to  light.     Comments: Pale conjunctivo-  Neck:     Thyroid : No thyromegaly.     Vascular: No JVD.  Cardiovascular:     Rate and Rhythm: Normal rate. Rhythm irregular.     Heart sounds: Normal heart sounds. No murmur heard.    No friction rub. No gallop.  Pulmonary:     Effort: Pulmonary effort is normal. No respiratory distress.     Breath sounds: Normal breath sounds. No wheezing or rales.  Abdominal:     General: Bowel sounds are normal. There is no distension.     Palpations: Abdomen is soft. There is no mass.     Tenderness: There is no abdominal tenderness.  Musculoskeletal:        General: No tenderness. Normal range of motion.     Cervical back: Normal range of motion and neck supple.     Right lower leg: Edema present.     Left lower leg: Edema present.  Lymphadenopathy:     Cervical: No cervical adenopathy.  Skin:    General: Skin is warm and dry.     Findings: No erythema or rash.  Neurological:     Mental Status: He is alert.     Coordination: Coordination normal.  Psychiatric:        Behavior: Behavior normal.     (all labs ordered are listed, but only abnormal results are displayed) Labs Reviewed  CBC WITH DIFFERENTIAL/PLATELET - Abnormal; Notable for the following components:      Result Value   RBC 2.25 (*)    Hemoglobin 6.7 (*)    HCT 21.0 (*)    RDW 22.9 (*)    Lymphs Abs 0.4 (*)    All other components within normal limits  COMPREHENSIVE METABOLIC PANEL WITH GFR - Abnormal; Notable for the following components:   Potassium 3.3 (*)    CO2 20 (*)    Glucose, Bld 177 (*)    Calcium  8.1 (*)    Total Protein 5.2 (*)    Albumin 2.5 (*)    AST 14 (*)    All other components within normal limits  LIPASE, BLOOD  PROTIME-INR  LACTIC ACID, PLASMA  LACTIC ACID, PLASMA  TYPE AND SCREEN  PREPARE RBC (CROSSMATCH)    EKG: EKG Interpretation Date/Time:  Thursday September 07 2023 20:38:11 EDT Ventricular Rate:  105 PR Interval:    QRS Duration:  101 QT  Interval:  353 QTC Calculation: 467 R Axis:   -44  Text Interpretation: Atrial fibrillation Left anterior fascicular block Abnormal R-wave progression, late transition Confirmed by Cleotilde Rogue ([id number]) on  09/07/2023 9:41:34 PM  Radiology: [facility] Chest Port 1 View Result Date: 09/07/2023 CLINICAL DATA:  Vomiting blood EXAM: PORTABLE CHEST 1 VIEW COMPARISON:  08/29/2023 FINDINGS: Stable cardiomegaly. Similar partially loculated small left pleural effusion. Probable trace right pleural effusion. No pneumothorax. Diffuse interstitial coarsening. Left basilar airspace opacities. IMPRESSION: 1. Cardiomegaly with interstitial pulmonary edema. 2. Similar partially loculated small left pleural effusion. Probable trace right pleural effusion. 3. Left basilar airspace opacities may be due to atelectasis or infection. Electronically Signed   By: Norman Gatlin M.D.   On: 09/07/2023 20:57     .Critical Care  Performed by: Cleotilde Rogue, MD Authorized by: Cleotilde Rogue, MD   Critical care provider statement:    Critical care time (minutes):  45   Critical care time was exclusive of:  Separately billable procedures and treating other patients and teaching time   Critical care was necessary to treat or prevent imminent or life-threatening deterioration of the following conditions: Acute GI bleed, severe anemia.   Critical care was time spent personally by me on the following activities:  Development of treatment plan with patient or surrogate, discussions with consultants, evaluation of patient's response to treatment, examination of patient, obtaining history from patient or surrogate, review of old charts, re-evaluation of patient's condition, pulse oximetry, ordering and review of radiographic studies, ordering and review of laboratory studies and ordering and performing treatments and interventions   I assumed direction of critical care for this patient from another provider in my specialty: no     Care  discussed with: admitting provider   Comments:          Medications Ordered in the ED  0.9 %  sodium chloride  infusion (Manually program via Guardrails IV Fluids) (has no administration in time range)  octreotide  (SANDOSTATIN ) 2 mcg/mL load via infusion 100 mcg (100 mcg Intravenous Bolus from Bag 09/07/23 2130)    And  octreotide  (SANDOSTATIN ) 500 mcg in sodium chloride  0.9 % 250 mL (2 mcg/mL) infusion (50 mcg/hr Intravenous New Bag/Given 09/07/23 2131)  pantoprazole  (PROTONIX ) injection 80 mg (80 mg Intravenous Given 09/07/23 2057)    Clinical Course as of 09/07/23 2143  Thu Sep 07, 2023  2112 I discussed the case with Dr. Cinderella at 9:12 PM, he is agreeable to help take care of this patient from an upper GI bleed standpoint, agreeable with the medications which I have ordered including Protonix  and octreotide  as well as IV fluids and a blood transfusion.  Will admit to the hospitalist [BM]    Clinical Course User Index [BM] Cleotilde Rogue, MD                                 Medical Decision Making Amount and/or Complexity of Data Reviewed Labs: ordered. Radiology: ordered.  Risk Prescription drug management. Decision regarding hospitalization.    This patient presents to the ED for concern of upper GI bleed, this involves an extensive number of treatment options, and is a complaint that carries with it a high risk of complications and morbidity.  The differential diagnosis includes upper GI bleed, peptic ulcer disease, arteriovenous malformation, variceal bleed   Co morbidities / Chronic conditions that complicate the patient evaluation  Heavy alcohol use in the past, concurrent cancer   Additional history obtained:  Additional history obtained from EMR External records from outside source obtained and reviewed including record including oncology notes   Lab Tests:  I Ordered, and  personally interpreted labs.  The pertinent results include: Severe anemia, hemoglobin is  dropped from about 10-1/2-6-1/2 over the last week, BUN of 22, creatinine of 0.8, CO2 of 20 glucose of 177 and mild hypokalemia   Imaging Studies ordered:  I ordered imaging studies including chest x-ray I independently visualized and interpreted imaging which showed pulmonary edema and atelectasis I agree with the radiologist interpretation   Cardiac Monitoring: / EKG:  The patient was maintained on a cardiac monitor.  I personally viewed and interpreted the cardiac monitored which showed an underlying rhythm of: Atrial fibrillation with slight tachycardia   Problem List / ED Course / Critical interventions / Medication management  This patient is critically ill and required multiple interventions including Protonix  bolus, octreotide  bolus and infusion, IV fluid resuscitation and 2 units of packed red blood cells which were emergently transfused as well as consultation with gastroenterology I ordered medication including as above Reevaluation of the patient after these medicines showed that the patient slight improvement in blood pressure I have reviewed the patients home medicines and have made adjustments as needed   Consultations Obtained:  I requested consultation with the gastroenterologist Dr. Cinderella,  and discussed lab and imaging findings as well as pertinent plan - they recommend: Admission, they will participate in the care of this patient, they recommend the patient does not need to have reversal of the Eliquis  unless he becomes unstable with requiring pressors, he does not require those at this time Will discuss with hospitalist for admission   Social Determinants of Health:  Prior heavy alcohol use   Test / Admission - Considered:  Admit to high level of care      Final diagnoses:  Upper GI bleed  Severe anemia    ED Discharge Orders     None          Cleotilde Rogue, MD 09/07/23 2143

## 2023-09-07 NOTE — Assessment & Plan Note (Addendum)
 Rate controlled and on anticoagulation with Eliquis . -Hold Eliquis  and metoprolol 

## 2023-09-07 NOTE — Assessment & Plan Note (Addendum)
 Presenting with melena, hematochezia and hematemesis.  Hemoglobin 6.7, down from 10.7 last checked - a week ago 7/31.  On Eliquis .  Denies NSAID use.  Blood pressure initially 94/64, improved.  Last CT abdomen-04/2023 no indication of cirrhosis.  Chest x-ray showing cardiomegaly and interstitial pulmonary edema.  Last echo from 2021 EF of 55 to 60%. -Transfused 2 units -Trend H&H -Cautious with fluids, he is on Lasix  20 mg daily, held for now with initial hypotension - IV Protonix  80 mg bolus given, continue with 40 twice daily - Continue octreotide  -EDP talked to Dr. Cinderella, will see in consult, no need to reverse anticoagulation at this time - n.p.o. -Hold Eliquis  - Lactic acid 2.6 likely from initial hypotension, trend

## 2023-09-07 NOTE — Telephone Encounter (Addendum)
 Wife called reporting complications. Was able to speak w/[name] and he reports diarrhea today of black stools. Took Imodium #2 tablets and it has improved. Reports he had black stools yesterday as well without diarrhea. No N/V or abdominal pain. Mrs. Sermersheim thinks he looks pale and he reports feeling lightheaded at times. Does not take iron  or had any Pepto Bismol. No more SOB than usual. He confirms that he resumed his Eliquis  on 08/29/23

## 2023-09-07 NOTE — H&P (Signed)
 History and Physical    Raymond Hurst DOB: 1950/12/25 DOA: 09/07/2023  PCP: [facility]   Patient coming from: Home  I have personally briefly reviewed patient's old medical records in [facility]  Chief Complaint: Black stools, vomiting blood  HPI: Raymond Hurst is a 73 y.o. male with medical history significant for atrial fibrillation on chronic anticoagulation, gout, GI bleed, stage IV lung cancer, alcohol abuse. Patient presented to the ED with complaints of black stools of 3 days duration, blood in the stool that he noticed today, and on EMS arrival, he was vomiting blood.  He reports dizziness.  His last dose of Eliquis  was at about noon today. He denies NSAID use.  He denies abdominal pain.  ED Course: Temperature 97.7.  Heart rate 92-101.  Respiratory rate 11-21.  Blood pressure systolic in the 90s, ranging from 94 to now 111. Hemoglobin 6.7.  INR 1.3. Chest x-ray shows cardiomegaly, interstitial pulmonary edema, small left pleural effusion and trace right pleural effusion.  Left basilar space disease - atelectasis or infection. Octreotide , Protonix  started.  PRBC ordered for transfusion. EDP talked to Dr. Cinderella, okay to stay here.  Review of Systems: As per HPI all other systems reviewed and negative.  Past Medical History:  Diagnosis Date   Adrenal tumor    right adrenal gland tumor favored to be a myelolipoma   Atrial fibrillation (HCC)    Cellulitis of right lower leg    Dysrhythmia    A-fib   History of TIA (transient ischemic attack)    May 2020   Lymphedema    OSA on CPAP    severe obstructive sleep apnea with an AHI of 54/h with no significant central events.  He had nocturnal hypoxemia with O2 sat nadir of 70%. Now on CPAP   Pulmonary mass    Stroke [facility])     Past Surgical History:  Procedure Laterality Date   BRONCHIAL BIOPSY  06/12/2023   Procedure: BRONCHOSCOPY, WITH BIOPSY;  Surgeon: Shelah Lamar RAMAN, MD;   Location: [facility] ENDOSCOPY;  Service: Cardiopulmonary;;   BRONCHIAL BRUSHINGS  06/12/2023   Procedure: BRONCHOSCOPY, WITH BRUSH BIOPSY;  Surgeon: Shelah Lamar RAMAN, MD;  Location: [facility] ENDOSCOPY;  Service: Cardiopulmonary;;   BRONCHIAL NEEDLE ASPIRATION BIOPSY  06/12/2023   Procedure: BRONCHOSCOPY, WITH NEEDLE ASPIRATION BIOPSY;  Surgeon: Shelah Lamar RAMAN, MD;  Location: [facility] ENDOSCOPY;  Service: Cardiopulmonary;;   BRONCHIAL NEEDLE ASPIRATION BIOPSY  08/28/2023   Procedure: BRONCHOSCOPY, WITH NEEDLE ASPIRATION BIOPSY;  Surgeon: Shelah Lamar RAMAN, MD;  Location: [facility] ENDOSCOPY;  Service: Pulmonary;;   CRYOTHERAPY  08/28/2023   Procedure: CRYOTHERAPY;  Surgeon: Shelah Lamar RAMAN, MD;  Location: [facility] ENDOSCOPY;  Service: Pulmonary;;   HEMOSTASIS CLIP PLACEMENT  06/12/2023   Procedure: CONTROL OF HEMORRHAGE bronch cold saline and epi;  Surgeon: Shelah Lamar RAMAN, MD;  Location: [facility] ENDOSCOPY;  Service: Cardiopulmonary;;   INNER EAR SURGERY     KNEE SURGERY Left    THORACENTESIS Left 06/06/2023   Procedure: THORACENTESIS;  Surgeon: Arlinda Ranks, MD;  Location: [facility] ENDOSCOPY;  Service: Pulmonary;  Laterality: Left;   TONSILLECTOMY     VIDEO BRONCHOSCOPY WITH ENDOBRONCHIAL NAVIGATION Left 08/28/2023   Procedure: VIDEO BRONCHOSCOPY WITH ENDOBRONCHIAL NAVIGATION;  Surgeon: Shelah Lamar RAMAN, MD;  Location: [facility] ENDOSCOPY;  Service: Pulmonary;  Laterality: Left;   VIDEO BRONCHOSCOPY WITH ENDOBRONCHIAL ULTRASOUND Left 06/12/2023   Procedure: BRONCHOSCOPY, WITH EBUS;  Surgeon: Shelah Lamar RAMAN, MD;  Location: [facility] ENDOSCOPY;  Service:  Cardiopulmonary;  Laterality: Left;   VIDEO BRONCHOSCOPY WITH ENDOBRONCHIAL ULTRASOUND  08/28/2023   Procedure: BRONCHOSCOPY, WITH EBUS;  Surgeon: Shelah Lamar RAMAN, MD;  Location: [facility] ENDOSCOPY;  Service: Pulmonary;;     reports that he quit smoking about 33 years ago. His smoking use included cigarettes. He started smoking about 53 years ago. He has a 40 pack-year smoking history. He has never used smokeless  tobacco. He reports current alcohol use. He reports that he does not use drugs.  No Known Allergies  Family History  Problem Relation Age of Onset   Heart failure Mother    COPD Mother    Atrial fibrillation Mother    Idiopathic pulmonary fibrosis Father    Prior to Admission medications   Medication Sig Start Date End Date Taking? Authorizing Provider  acetaminophen  (TYLENOL ) 500 MG tablet Take 500 mg by mouth every 6 (six) hours as needed for mild pain.   Yes [provider]  allopurinol  (ZYLOPRIM ) 100 MG tablet Take 100 mg by mouth daily. 05/25/22  Yes [provider]  apixaban  (ELIQUIS ) 5 MG TABS tablet Take 1 tablet (5 mg total) by mouth 2 (two) times daily. Okay to restart this medication on 08/29/2023 08/28/23  Yes Shelah Lamar RAMAN, MD  cholecalciferol  (VITAMIN D3) 25 MCG (1000 UNIT) tablet Take 1,000 Units by mouth daily.   Yes [provider]  cyanocobalamin  (VITAMIN B12) 100 MCG tablet Take 100 mcg by mouth daily.   Yes [provider]  furosemide  (LASIX ) 40 MG tablet Take 20 mg by mouth daily.   Yes [provider]  HYDROcodone  bit-homatropine (HYCODAN) 5-1.5 MG/5ML syrup Take 5 mLs by mouth every 6 (six) hours as needed for cough. 07/03/23  Yes Meade Verdon RAMAN, MD  levalbuterol  (XOPENEX  HFA) 45 MCG/ACT inhaler Inhale 1 puff into the lungs every 4 (four) hours as needed for wheezing. 07/26/23 07/25/24 Yes Meade Verdon RAMAN, MD  loperamide (IMODIUM A-D) 2 MG tablet Take 2-4 mg by mouth 4 (four) times daily as needed for diarrhea or loose stools.   Yes [provider]  metoprolol  tartrate (LOPRESSOR ) 25 MG tablet Take 1 tablet (25 mg total) by mouth 2 (two) times daily. 11/17/21  Yes Strader, [place] M, PA-C  ondansetron  (ZOFRAN ) 8 MG tablet Take 1 tablet (8 mg total) by mouth every 8 (eight) hours as needed (As needed for nausea starting 3 days after chemo). 07/07/23  Yes Cloretta Arley NOVAK, MD  prochlorperazine  (COMPAZINE ) 10 MG tablet Take  1 tablet (10 mg total) by mouth every 6 (six) hours as needed for nausea or vomiting. 07/07/23  Yes Cloretta Arley NOVAK, MD  simethicone (MYLICON) 125 MG chewable tablet Chew 125 mg by mouth every 6 (six) hours as needed for flatulence.   Yes [provider]  thiamine  (VITAMIN B-1) 100 MG tablet Take 100 mg by mouth daily.   Yes [provider]    Physical Exam: Vitals:   09/07/23 2034 09/07/23 2117 09/07/23 2130 09/07/23 2145  BP:  98/63 94/64 (!) 103/56  Pulse: 96 96 (!) 101 92  Resp: 11 (!) 27 (!) 21 16  SpO2: 100% 96% 99% 99%  Weight: 120 kg     Height: 6' (1.829 m)       Constitutional: Pale, calm, comfortable Vitals:   09/07/23 2034 09/07/23 2117 09/07/23 2130 09/07/23 2145  BP:  98/63 94/64 (!) 103/56  Pulse: 96 96 (!) 101 92  Resp: 11 (!) 27 (!) 21 16  SpO2: 100% 96% 99%  99%  Weight: 120 kg     Height: 6' (1.829 m)      Eyes: PERRL, lids and conjunctivae normal ENMT: Mucous membranes are mildly dry. Neck: normal, supple, no masses, no thyromegaly Respiratory: clear to auscultation bilaterally, no wheezing, no crackles. Normal respiratory effort. No accessory muscle use.  Cardiovascular: Regular rate and rhythm, no murmurs / rubs / gallops.  Chronic unchanged lymphedema to bilateral lower extremities, wearing compression stockings Abdomen: distended, soft nontender, no masses palpated. No hepatosplenomegaly. Bowel sounds positive.  Musculoskeletal: no clubbing / cyanosis. No joint deformity upper and lower extremities.   Skin: no rashes, lesions, ulcers. No induration Neurologic: No facial asymmetry, moving extremities spontaneously, speech fluent. Psychiatric: Normal judgment and insight. Alert and oriented x 3. Normal mood.   Labs on Admission: I have personally reviewed following labs and imaging studies  CBC: Recent Labs  Lab 09/07/23 2047  WBC 5.3  NEUTROABS 4.4  HGB 6.7*  HCT 21.0*  MCV 93.3  PLT 194   Basic Metabolic Panel: Recent Labs   Lab 09/07/23 2047  NA 139  K 3.3*  CL 106  CO2 20*  GLUCOSE 177*  BUN 22  CREATININE 0.79  CALCIUM  8.1*   GFR: Estimated Creatinine Clearance: 113.3 mL/min (by C-G formula based on SCr of 0.79 mg/dL). Liver Function Tests: Recent Labs  Lab 09/07/23 2047  AST 14*  ALT 8  ALKPHOS 42  BILITOT 0.6  PROT 5.2*  ALBUMIN 2.5*   Recent Labs  Lab 09/07/23 2047  LIPASE 28   No results for input(s): AMMONIA in the last 168 hours. Coagulation Profile: Recent Labs  Lab 09/07/23 2047  INR 1.3*   Radiological Exams on Admission: DG Chest Port 1 View Result Date: 09/07/2023 CLINICAL DATA:  Vomiting blood EXAM: PORTABLE CHEST 1 VIEW COMPARISON:  08/29/2023 FINDINGS: Stable cardiomegaly. Similar partially loculated small left pleural effusion. Probable trace right pleural effusion. No pneumothorax. Diffuse interstitial coarsening. Left basilar airspace opacities. IMPRESSION: 1. Cardiomegaly with interstitial pulmonary edema. 2. Similar partially loculated small left pleural effusion. Probable trace right pleural effusion. 3. Left basilar airspace opacities may be due to atelectasis or infection. Electronically Signed   By: Norman Gatlin M.D.   On: 09/07/2023 20:57   EKG: Independently reviewed.  Atrial fibrillation, rate 105, QTc 467.  Assessment/Plan Principal Problem:   GI bleed Active Problems:   Paroxysmal atrial fibrillation (HCC)   OSA on CPAP   Gout   Alcohol abuse   Lymphedema of leg   Lung cancer, upper lobe (HCC)   Acute on chronic anemia   Assessment and Plan: * GI bleed Presenting with melena, hematochezia and hematemesis.  Hemoglobin 6.7, down from 10.7 last checked - a week ago 7/31.  On Eliquis .  Denies NSAID use.  Blood pressure initially 94/64, improved.  Last CT abdomen-04/2023 no indication of cirrhosis.  Chest x-ray showing cardiomegaly and interstitial pulmonary edema.  Last echo from 2021 EF of 55 to 60%. -Transfused 2 units -Trend H&H -Cautious with  fluids, he is on Lasix  20 mg daily, held for now with initial hypotension - IV Protonix  80 mg bolus given, continue with 40 twice daily - Continue octreotide  -EDP talked to Dr. Cinderella, will see in consult, no need to reverse anticoagulation at this time - n.p.o. -Hold Eliquis  - Lactic acid 2.6 likely from initial hypotension, trend  Paroxysmal atrial fibrillation (HCC) Rate controlled and on anticoagulation with Eliquis . -Hold Eliquis  and metoprolol   Gout Hold allopurinol  while n.p.o.  Lung cancer, upper  lobe [facility]) Follows with Dr. Cloretta.  Diagnosed 06/2023.  Per last notes 08/23/2023-patient completed last cycle of chemotherapy 7/23 and radiation 8/1.  Lymphedema of leg Stable and at baseline.  Alcohol abuse Previously heavy drinker, has cut down.  His last drink was yesterday, drank 1- 12 ounce beer.  No history of seizures.  Denies history of alcohol withdrawals. -CIWA as needed -Thiamine , folate multivitamins when able   DVT prophylaxis: SCDS Code Status: FULL-confirmed with patient and spouse at bedside Family Communication: Spouse at bedside Disposition Plan: > 2 days Consults called: GI Admission status: Inpt Stepdown I certify that at the point of admission it is my clinical judgment that the patient will require inpatient hospital care spanning beyond 2 midnights from the point of admission due to high intensity of service, high risk for further deterioration and high frequency of surveillance required.   CRITICAL CARE Performed by: Tully FORBES Carwin   Total critical care time: 70 minutes  Critical care time was exclusive of separately billable procedures and treating other patients.  Critical care was necessary to treat or prevent imminent or life-threatening deterioration.  Critical care was time spent personally by me on the following activities: development of treatment plan with patient and/or surrogate as well as nursing, discussions with consultants,  evaluation of patient's response to treatment, examination of patient, obtaining history from patient or surrogate, ordering and performing treatments and interventions, ordering and review of laboratory studies, ordering and review of radiographic studies, pulse oximetry and re-evaluation of patient's condition.   Author: Tully FORBES Carwin, MD 09/07/2023 10:55 PM  For on call review [email or web address].

## 2023-09-08 ENCOUNTER — Inpatient Hospital Stay: Admitting: Nurse Practitioner

## 2023-09-08 ENCOUNTER — Ambulatory Visit (INDEPENDENT_AMBULATORY_CARE_PROVIDER_SITE_OTHER): Admitting: Audiology

## 2023-09-08 ENCOUNTER — Other Ambulatory Visit

## 2023-09-08 DIAGNOSIS — F1010 Alcohol abuse, uncomplicated: Secondary | ICD-10-CM | POA: Diagnosis not present

## 2023-09-08 DIAGNOSIS — K264 Chronic or unspecified duodenal ulcer with hemorrhage: Secondary | ICD-10-CM | POA: Diagnosis not present

## 2023-09-08 DIAGNOSIS — I480 Paroxysmal atrial fibrillation: Secondary | ICD-10-CM | POA: Diagnosis not present

## 2023-09-08 DIAGNOSIS — D649 Anemia, unspecified: Secondary | ICD-10-CM | POA: Diagnosis not present

## 2023-09-08 DIAGNOSIS — K254 Chronic or unspecified gastric ulcer with hemorrhage: Secondary | ICD-10-CM

## 2023-09-08 DIAGNOSIS — I890 Lymphedema, not elsewhere classified: Secondary | ICD-10-CM | POA: Diagnosis not present

## 2023-09-08 LAB — CBC
HCT: 22.1 % — ABNORMAL LOW (ref 39.0–52.0)
HCT: 21.7 % — ABNORMAL LOW (ref 39.0–52.0)
Hemoglobin: 7.4 g/dL — ABNORMAL LOW (ref 13.0–17.0)
Hemoglobin: 7.2 g/dL — ABNORMAL LOW (ref 13.0–17.0)
MCH: 31 pg (ref 26.0–34.0)
MCH: 30.9 pg (ref 26.0–34.0)
MCHC: 33.5 g/dL (ref 30.0–36.0)
MCHC: 33.2 g/dL (ref 30.0–36.0)
MCV: 92.5 fL (ref 80.0–100.0)
MCV: 93.1 fL (ref 80.0–100.0)
Platelets: 141 K/uL — ABNORMAL LOW (ref 150–400)
Platelets: 147 K/uL — ABNORMAL LOW (ref 150–400)
RBC: 2.39 MIL/uL — ABNORMAL LOW (ref 4.22–5.81)
RBC: 2.33 MIL/uL — ABNORMAL LOW (ref 4.22–5.81)
RDW: 20.3 % — ABNORMAL HIGH (ref 11.5–15.5)
RDW: 21.1 % — ABNORMAL HIGH (ref 11.5–15.5)
WBC: 2.8 K/uL — ABNORMAL LOW (ref 4.0–10.5)
WBC: 3 K/uL — ABNORMAL LOW (ref 4.0–10.5)
nRBC: 0 % (ref 0.0–0.2)
nRBC: 0 % (ref 0.0–0.2)

## 2023-09-08 LAB — HEMOGLOBIN AND HEMATOCRIT, BLOOD
HCT: 21.2 % — ABNORMAL LOW (ref 39.0–52.0)
HCT: 22 % — ABNORMAL LOW (ref 39.0–52.0)
Hemoglobin: 7 g/dL — ABNORMAL LOW (ref 13.0–17.0)
Hemoglobin: 7.3 g/dL — ABNORMAL LOW (ref 13.0–17.0)

## 2023-09-08 LAB — LACTIC ACID, PLASMA: Lactic Acid, Venous: 0.9 mmol/L (ref 0.5–1.9)

## 2023-09-08 LAB — BASIC METABOLIC PANEL WITH GFR
Anion gap: 6 (ref 5–15)
BUN: 22 mg/dL (ref 8–23)
CO2: 23 mmol/L (ref 22–32)
Calcium: 7.8 mg/dL — ABNORMAL LOW (ref 8.9–10.3)
Chloride: 108 mmol/L (ref 98–111)
Creatinine, Ser: 0.65 mg/dL (ref 0.61–1.24)
GFR, Estimated: >60 mL/min (ref >60)
Glucose, Bld: 137 mg/dL — ABNORMAL HIGH (ref 70–99)
Potassium: 4.3 mmol/L (ref 3.5–5.1)
Sodium: 137 mmol/L (ref 135–145)

## 2023-09-08 LAB — MRSA NEXT GEN BY PCR, NASAL: MRSA by PCR Next Gen: NOT DETECTED

## 2023-09-08 MED ORDER — ORAL CARE MOUTH RINSE: 15.0000 mL | OROMUCOSAL | Status: DC | PRN

## 2023-09-08 NOTE — Plan of Care (Signed)

## 2023-09-08 NOTE — Progress Notes (Addendum)
 PROGRESS NOTE  Raymond Hurst, is a 73 y.o. male, DOB - 1950-06-10, FMW:[id number]  Admit date - 09/07/2023   Admitting Physician Tully FORBES Carwin, MD  Outpatient Primary MD for the patient is [facility]  LOS - 1  Chief Complaint  Patient presents with   Rectal Bleeding   Hematemesis      Brief Narrative:  73 y.o. male with medical history significant for atrial fibrillation on chronic anticoagulation, gout, GI bleed, stage IV lung cancer, alcohol abuse admitted on 09/07/23 with Acute Gi Bleed--with Hgb of 6.7 , baseline usually >> 10    -Assessment and Plan: 1)Acute GI bleed Presenting with melena, hematochezia and hematemesis -- Continues to have coffee-ground emesis.   - Hemoglobin is up to 7.2 from 6.7 after transfusion of 2 units of PRBC - GI consult appreciated plans for endoluminal evaluation on 09/09/2023 after Eliquis  washout - Last dose of Eliquis  a.m. of 09/07/2023 -  Last CT abdomen-04/2023 no indication of cirrhosis.   - Continue IV Protonix  and Octreotide    2)Paroxysmal atrial fibrillation (HCC) Rate controlled and on anticoagulation with Eliquis  PTA  -Hold Eliquis  and metoprolol   3) acute on chronic anemia--due to #1 above - Management as above #1  Gout Hold allopurinol  while n.p.o.  Lung cancer, upper lobe (HCC) Follows with Dr. Cloretta.  Diagnosed 06/2023.  Per last notes 08/23/2023-patient completed last cycle of chemotherapy 7/23 and radiation 09/01/23.  Lymphedema of leg Stable and at baseline.  Alcohol abuse Previously heavy drinker, has cut down.  His last drink was yesterday, drank 1- 12 ounce beer.  No history of seizures.  Denies history of alcohol withdrawals. -CIWA as needed -Thiamine , folate multivitamins when able  Status is: Inpatient   Disposition: The patient is from: Home              Anticipated d/c is to: Home              Anticipated d/c date is: 2 days              Patient currently is not medically stable to  d/c. Barriers: Not Clinically Stable-   Code Status :  -  Code Status: Full Code   Family Communication:   NA (patient is alert, awake and coherent)   DVT Prophylaxis  :   - SCDs  SCDs Start: 09/07/23 2308   Lab Results  Component Value Date   PLT 141 (L) 09/08/2023    Inpatient Medications  Scheduled Meds:  Chlorhexidine  Gluconate Cloth  6 each Topical Daily   folic acid   1 mg Oral Daily   multivitamin with minerals  1 tablet Oral Daily   pantoprazole  (PROTONIX ) IV  40 mg Intravenous Q12H   thiamine   100 mg Oral Daily   Or   thiamine   100 mg Intravenous Daily   Continuous Infusions:  octreotide  (SANDOSTATIN ) 500 mcg in sodium chloride  0.9 % 250 mL (2 mcg/mL) infusion 50 mcg/hr (09/08/23 0446)   PRN Meds:.acetaminophen  **OR** acetaminophen , LORazepam  **OR** LORazepam , ondansetron  **OR** ondansetron  (ZOFRAN ) IV, mouth rinse, polyethylene glycol   Anti-infectives (From admission, onward)    None      Subjective: [name] today has no fevers,  No chest pain,    --Coffee-ground emesis persist - Abdominal discomfort  Objective: Vitals:   09/08/23 0500 09/08/23 0600 09/08/23 0700 09/08/23 0741  BP: (!) 103/48 (!) 108/52 (!) 104/53   Pulse: 87 93 99   Resp: 16 15 16    Temp:  97.8 F (36.6 C)  TempSrc:    Oral  SpO2: 100% 100% 100%   Weight:      Height:        Intake/Output Summary (Last 24 hours) at 09/08/2023 0932 Last data filed at 09/08/2023 0418 Gross per 24 hour  Intake 1135.63 ml  Output 200 ml  Net 935.63 ml   Filed Weights   09/07/23 2034 09/07/23 2251  Weight: 120 kg 119.3 kg   Physical Exam Gen:- Awake Alert,  in no apparent distress  HEENT:- [place].AT, No sclera icterus Neck-Supple Neck,No JVD,.  Lungs-  CTAB , fair symmetrical air movement CV- S1, S2 normal, regular  Abd-  +ve B.Sounds, Abd Soft, epigastric tenderness without rebound or guarding Extremity/Skin:- pedal pulses present , stable lymphedema Psych-affect is appropriate,  oriented x3 Neuro-no new focal deficits, no tremors  Data Reviewed: I have personally reviewed following labs and imaging studies  CBC: Recent Labs  Lab 09/07/23 2047 09/08/23 0417  WBC 5.3 2.8*  NEUTROABS 4.4  --   HGB 6.7* 7.4*  HCT 21.0* 22.1*  MCV 93.3 92.5  PLT 194 141*   Basic Metabolic Panel: Recent Labs  Lab 09/07/23 2047 09/08/23 0417  NA 139 137  K 3.3* 4.3  CL 106 108  CO2 20* 23  GLUCOSE 177* 137*  BUN 22 22  CREATININE 0.79 0.65  CALCIUM  8.1* 7.8*   GFR: Estimated Creatinine Clearance: 113 mL/min (by C-G formula based on SCr of 0.65 mg/dL). Liver Function Tests: Recent Labs  Lab 09/07/23 2047  AST 14*  ALT 8  ALKPHOS 42  BILITOT 0.6  PROT 5.2*  ALBUMIN 2.5*   Recent Results (from the past 240 hours)  MRSA Next Gen by PCR, Nasal     Status: None   Collection Time: 09/08/23  4:17 AM   Specimen: Nasal Mucosa; Nasal Swab  Result Value Ref Range Status   MRSA by PCR Next Gen NOT DETECTED NOT DETECTED Final    Comment: (NOTE) The GeneXpert MRSA Assay (FDA approved for NASAL specimens only), is one component of a comprehensive MRSA colonization surveillance program. It is not intended to diagnose MRSA infection nor to guide or monitor treatment for MRSA infections. Test performance is not FDA approved in patients less than 41 years old. Performed at [facility], [address], [place], [place] [address]     Radiology Studies: [facility] 1 View Result Date: 09/07/2023 CLINICAL DATA:  Vomiting blood EXAM: PORTABLE CHEST 1 VIEW COMPARISON:  08/29/2023 FINDINGS: Stable cardiomegaly. Similar partially loculated small left pleural effusion. Probable trace right pleural effusion. No pneumothorax. Diffuse interstitial coarsening. Left basilar airspace opacities. IMPRESSION: 1. Cardiomegaly with interstitial pulmonary edema. 2. Similar partially loculated small left pleural effusion. Probable trace right pleural effusion. 3. Left basilar airspace opacities  may be due to atelectasis or infection. Electronically Signed   By: Norman Gatlin M.D.   On: 09/07/2023 20:57   Scheduled Meds:  Chlorhexidine  Gluconate Cloth  6 each Topical Daily   folic acid   1 mg Oral Daily   multivitamin with minerals  1 tablet Oral Daily   pantoprazole  (PROTONIX ) IV  40 mg Intravenous Q12H   thiamine   100 mg Oral Daily   Or   thiamine   100 mg Intravenous Daily   Continuous Infusions:  octreotide  (SANDOSTATIN ) 500 mcg in sodium chloride  0.9 % 250 mL (2 mcg/mL) infusion 50 mcg/hr (09/08/23 0446)    LOS: 1 day   Rendall Carwin M.D on 09/08/2023 at 9:32 AM  Go to [email or web address] - for contact info  [facility] - Office  [phone]  If 7PM-7AM, please contact night-coverage [email or web address] 09/08/2023, 9:32 AM

## 2023-09-08 NOTE — TOC Initial Note (Signed)
 Transition of Care [facility]) - Initial/Assessment Note    Patient Details  Name: Raymond Hurst MRN: [id number] Date of Birth: 06/29/1950  Transition of Care [facility]) CM/SW Contact:    Lucie Lunger, LCSWA Phone Number: 09/08/2023, 12:10 PM  Clinical Narrative:                 Pt is high risk for readmission. CSW spoke with pts wife to complete assessment. Pt is independent in completing his ADLs and drives himself when needed. Pt has not had HH in the past and does not use any DME in the home. TOC to follow.   Expected Discharge Plan: Home/Self Care Barriers to Discharge: Continued Medical Work up   Patient Goals and CMS Choice Patient states their goals for this hospitalization and ongoing recovery are:: return home CMS Medicare.gov Compare Post Acute Care list provided to:: Patient Choice offered to / list presented to : Patient      Expected Discharge Plan and Services In-house Referral: Clinical Social Work Discharge Planning Services: CM Consult   Living arrangements for the past 2 months: Single Family Home                                      Prior Living Arrangements/Services Living arrangements for the past 2 months: Single Family Home Lives with:: Spouse Patient language and need for interpreter reviewed:: Yes Do you feel safe going back to the place where you live?: Yes      Need for Family Participation in Patient Care: Yes (Comment) Care giver support system in place?: Yes (comment)   Criminal Activity/Legal Involvement Pertinent to Current Situation/Hospitalization: No - Comment as needed  Activities of Daily Living   ADL Screening (condition at time of admission) Independently performs ADLs?: Yes (appropriate for developmental age) Is the patient deaf or have difficulty hearing?: No Does the patient have difficulty seeing, even when wearing glasses/contacts?: No Does the patient have difficulty concentrating, remembering, or making decisions?:  No  Permission Sought/Granted                  Emotional Assessment Appearance:: Appears stated age Attitude/Demeanor/Rapport: Engaged Affect (typically observed): Accepting Orientation: : Oriented to Self, Oriented to Place, Oriented to  Time, Oriented to Situation Alcohol / Substance Use: Not Applicable Psych Involvement: No (comment)  Admission diagnosis:  GI bleed [K92.2] Upper GI bleed [K92.2] Severe anemia [D64.9] Patient Active Problem List   Diagnosis Date Noted   GI bleed 09/07/2023   Acute on chronic anemia 09/07/2023   Pneumothorax after biopsy 08/28/2023   Metastasis to left adrenal gland of unknown origin (HCC) 07/05/2023   Lung cancer, upper lobe (HCC) 06/27/2023   Acute hypoxic respiratory failure (HCC) 06/13/2023   Atrial fibrillation with rapid ventricular response (HCC) 06/13/2023   Lung mass 06/05/2023   Dysphonia 06/05/2023   Gout 07/22/2022   Right adrenal mass (HCC) 07/22/2022   Diverticulosis of intestine with bleeding 07/22/2022   Acute blood loss anemia 07/22/2022   Lower GI bleeding 07/21/2022   OSA on CPAP 05/10/2022   Alcoholism (HCC) 01/14/2021   Cardiac arrhythmia 01/14/2021   Hearing loss of left ear 01/14/2021   Long term (current) use of anticoagulants 01/14/2021   Lymphedema 01/14/2021   Neurocognitive disorder 01/14/2021   Tinnitus of right ear 01/14/2021   Vitamin B12 deficiency (non anemic) 01/14/2021   Vitamin D  deficiency 01/14/2021   DOE (dyspnea  on exertion) 03/25/2020   Encounter for removal of sutures 12/24/2019   Dizziness 06/08/2018   TIA (transient ischemic attack) 06/08/2018   Paroxysmal atrial fibrillation (HCC) 06/08/2018   Near syncope 06/07/2018   Hypokalemia 06/07/2018   Chronic suppurative otitis media of left ear 04/04/2018   Presbycusis of both ears 04/04/2018   Unilateral deafness, left 04/04/2018   Subjective tinnitus of both ears 06/02/2015   Lymphedema of leg 04/08/2013   Cellulitis of right lower  leg 04/04/2013   Morbid obesity due to excess calories (HCC) 04/04/2013   Alcohol abuse 04/04/2013   Cellulitis and abscess of leg 04/04/2013   PCP:  [facility]:   [id number] - [place], [place] - [address] [place] [address] Phone: [phone] Fax: [phone]     Social Drivers of Health (SDOH) Social History: SDOH Screenings   Food Insecurity: No Food Insecurity (09/07/2023)  Recent Concern: Food Insecurity - Food Insecurity Present (06/15/2023)  Housing: Low Risk  (09/07/2023)  Transportation Needs: No Transportation Needs (09/07/2023)  Utilities: Not At Risk (09/07/2023)  Recent Concern: Utilities - At Risk (07/04/2023)  Alcohol Screen: Low Risk  (06/15/2023)  Depression (PHQ2-9): Low Risk  (08/23/2023)  Financial Resource Strain: High Risk (06/15/2023)  Physical Activity: Inactive (06/15/2023)  Social Connections: Moderately Integrated (09/07/2023)  Stress: No Stress Concern Present (06/15/2023)  Tobacco Use: Medium Risk (09/07/2023)   SDOH Interventions:     Readmission Risk Interventions    09/08/2023   12:09 PM  Readmission Risk Prevention Plan  Transportation Screening Complete  HRI or Home Care Consult Complete  Social Work Consult for Recovery Care Planning/Counseling Complete  Palliative Care Screening Not Applicable  Medication Review Oceanographer) Complete

## 2023-09-08 NOTE — Consult Note (Signed)
 Gastroenterology Consult   Referring Provider: No ref. provider found Primary Care Physician:  [facility] Primary Gastroenterologist:  Lamar HERO.Rourk, MD (previously unassigned)  Patient ID: Raymond Hurst; [id number]; 1950/05/12   Admit date: 09/07/2023  LOS: 1 day   Date of Consultation: 09/08/2023  Reason for Consultation:  hematemesis, coffee ground emesis, Anemia  History of Present Illness   Raymond Hurst is a 73 y.o. year old male with history of A-fib on Eliquis , gout, alcohol abuse, OSA, ? stroke, and stage IV lung cancer s/p recent completion of chemo and radiation who presented to the ED with complaints of black/tarry stools, dizziness and weakness and subsequent hematemesis on EMS arrival.  GI consulted for further evaluation.   ED Course: Labs - Hgb 6.7, plts 141, wbc 2.8, AST 14, ALT 8, K 3.3, INR 1.3 CXR with cardiomegaly with interstitial pulmonary edema and bibasilar airspace opacities.   tachycardic and intermittently tachypneic on arrival with mild hypotension with systolics in the 90s.  Afebrile. Given octreotide  as well as Protonix  PRBC ordered  Consult: Dizziness and lightheadedness started last night. Has had black stools for 2-3 days. Around 1am Thursday morning he started having diarrhea and had 3 bouts of that with last one noon yesterday and had blood mixed in and very dark. Fine rest of the day, called PCP for advice and then yesterday afternoon around 7pm he had another loose bowel movement that was dark.  Denies any abdominal pain.  Has been on eliquis  for several years - last dose 8/7 at noon. No typical constipation. Has had gas and was taking gas ex pills.  Not on any PPI or H2 blocker at home.    During my examination he had a bowel movement and it was observed after he was cleaned up and was overtly melanotic.   NSAIDs - none  Alcohol use - 12 oz beer wednesday, couple bers a day before that. Has been drinking less over the  last few months, 6 pack over the weekend usually. 12 pack plus bottle scotch a week prior to that - several years.  Tobacco use - none, no vaping.   No EGD or Colonoscopy on file within our system. He reports he had colonscopy possibly with [facility] - reports it was 2-3 years ago and repots benign polyps. No prior EGD. Denies family history of colon cancer or polyps.  History of left lung squamous cell carcinoma - has completed chemo and radiation as of last week.   CT of the abdomen and pelvis in March without signs of cirrhosis.  Past Medical History:  Diagnosis Date   Adrenal tumor    right adrenal gland tumor favored to be a myelolipoma   Atrial fibrillation (HCC)    Cellulitis of right lower leg    Dysrhythmia    A-fib   History of TIA (transient ischemic attack)    May 2020   Lymphedema    OSA on CPAP    severe obstructive sleep apnea with an AHI of 54/h with no significant central events.  He had nocturnal hypoxemia with O2 sat nadir of 70%. Now on CPAP   Pulmonary mass    Stroke [facility]     Past Surgical History:  Procedure Laterality Date   BRONCHIAL BIOPSY  06/12/2023   Procedure: BRONCHOSCOPY, WITH BIOPSY;  Surgeon: Shelah Lamar RAMAN, MD;  Location: [facility] ENDOSCOPY;  Service: Cardiopulmonary;;   BRONCHIAL BRUSHINGS  06/12/2023   Procedure: BRONCHOSCOPY, WITH BRUSH BIOPSY;  Surgeon: Shelah Lamar RAMAN, MD;  Location: [facility] ENDOSCOPY;  Service: Cardiopulmonary;;   BRONCHIAL NEEDLE ASPIRATION BIOPSY  06/12/2023   Procedure: BRONCHOSCOPY, WITH NEEDLE ASPIRATION BIOPSY;  Surgeon: Shelah Lamar RAMAN, MD;  Location: [facility] ENDOSCOPY;  Service: Cardiopulmonary;;   BRONCHIAL NEEDLE ASPIRATION BIOPSY  08/28/2023   Procedure: BRONCHOSCOPY, WITH NEEDLE ASPIRATION BIOPSY;  Surgeon: Shelah Lamar RAMAN, MD;  Location[facility] ENDOSCOPY;  Service: Pulmonary;;   CRYOTHERAPY  08/28/2023   Procedure: CRYOTHERAPY;  Surgeon: Shelah Lamar RAMAN, MD;  Location: [facility] ENDOSCOPY;  Service: Pulmonary;;   HEMOSTASIS CLIP PLACEMENT   06/12/2023   Procedure: CONTROL OF HEMORRHAGE bronch cold saline and epi;  Surgeon: Shelah Lamar RAMAN, MD;  Location: [facility] ENDOSCOPY;  Service: Cardiopulmonary;;   INNER EAR SURGERY     KNEE SURGERY Left    THORACENTESIS Left 06/06/2023   Procedure: THORACENTESIS;  Surgeon: Arlinda Ranks, MD;  Location: [facility] ENDOSCOPY;  Service: Pulmonary;  Laterality: Left;   TONSILLECTOMY     VIDEO BRONCHOSCOPY WITH ENDOBRONCHIAL NAVIGATION Left 08/28/2023   Procedure: VIDEO BRONCHOSCOPY WITH ENDOBRONCHIAL NAVIGATION;  Surgeon: Shelah Lamar RAMAN, MD;  Location: [facility] ENDOSCOPY;  Service: Pulmonary;  Laterality: Left;   VIDEO BRONCHOSCOPY WITH ENDOBRONCHIAL ULTRASOUND Left 06/12/2023   Procedure: BRONCHOSCOPY, WITH EBUS;  Surgeon: Shelah Lamar RAMAN, MD;  Location: [facility] ENDOSCOPY;  Service: Cardiopulmonary;  Laterality: Left;   VIDEO BRONCHOSCOPY WITH ENDOBRONCHIAL ULTRASOUND  08/28/2023   Procedure: BRONCHOSCOPY, WITH EBUS;  Surgeon: Shelah Lamar RAMAN, MD;  Location: [facility] ENDOSCOPY;  Service: Pulmonary;;    Prior to Admission medications   Medication Sig Start Date End Date Taking? Authorizing Provider  acetaminophen  (TYLENOL ) 500 MG tablet Take 500 mg by mouth every 6 (six) hours as needed for mild pain.   Yes [provider]  allopurinol  (ZYLOPRIM ) 100 MG tablet Take 100 mg by mouth daily. 05/25/22  Yes [provider]  apixaban  (ELIQUIS ) 5 MG TABS tablet Take 1 tablet (5 mg total) by mouth 2 (two) times daily. Okay to restart this medication on 08/29/2023 08/28/23  Yes Shelah Lamar RAMAN, MD  cholecalciferol  (VITAMIN D3) 25 MCG (1000 UNIT) tablet Take 1,000 Units by mouth daily.   Yes [provider]  cyanocobalamin  (VITAMIN B12) 100 MCG tablet Take 100 mcg by mouth daily.   Yes [provider]  furosemide  (LASIX ) 40 MG tablet Take 20 mg by mouth daily.   Yes [provider]  HYDROcodone  bit-homatropine (HYCODAN) 5-1.5 MG/5ML syrup Take 5 mLs by mouth every 6 (six) hours as needed for  cough. 07/03/23  Yes Meade Verdon RAMAN, MD  levalbuterol  (XOPENEX  HFA) 45 MCG/ACT inhaler Inhale 1 puff into the lungs every 4 (four) hours as needed for wheezing. 07/26/23 07/25/24 Yes Meade Verdon RAMAN, MD  loperamide (IMODIUM A-D) 2 MG tablet Take 2-4 mg by mouth 4 (four) times daily as needed for diarrhea or loose stools.   Yes [provider]  metoprolol  tartrate (LOPRESSOR ) 25 MG tablet Take 1 tablet (25 mg total) by mouth 2 (two) times daily. 11/17/21  Yes Strader, [place] M, PA-C  ondansetron  (ZOFRAN ) 8 MG tablet Take 1 tablet (8 mg total) by mouth every 8 (eight) hours as needed (As needed for nausea starting 3 days after chemo). 07/07/23  Yes Cloretta Arley NOVAK, MD  prochlorperazine  (COMPAZINE ) 10 MG tablet Take 1 tablet (10 mg total) by mouth every 6 (six) hours as needed for nausea or vomiting. 07/07/23  Yes Cloretta Arley NOVAK, MD  simethicone (MYLICON) 125 MG chewable tablet Chew 125 mg by  mouth every 6 (six) hours as needed for flatulence.   Yes [provider]  thiamine  (VITAMIN B-1) 100 MG tablet Take 100 mg by mouth daily.   Yes [provider]    Current Facility-Administered Medications  Medication Dose Route Frequency Provider Last Rate Last Admin   acetaminophen  (TYLENOL ) tablet 650 mg  650 mg Oral Q6H PRN Emokpae, Ejiroghene E, MD       Or   acetaminophen  (TYLENOL ) suppository 650 mg  650 mg Rectal Q6H PRN Emokpae, Ejiroghene E, MD       Chlorhexidine  Gluconate Cloth 2 % PADS 6 each  6 each Topical Daily Emokpae, Ejiroghene E, MD   6 each at 09/08/23 1004   folic acid  (FOLVITE ) tablet 1 mg  1 mg Oral Daily Emokpae, Ejiroghene E, MD   1 mg at 09/08/23 0855   LORazepam  (ATIVAN ) tablet 1-4 mg  1-4 mg Oral Q1H PRN Emokpae, Ejiroghene E, MD       Or   LORazepam  (ATIVAN ) injection 1-4 mg  1-4 mg Intravenous Q1H PRN Emokpae, Ejiroghene E, MD       multivitamin with minerals tablet 1 tablet  1 tablet Oral Daily Emokpae, Ejiroghene E, MD   1 tablet at 09/08/23 0855    octreotide  (SANDOSTATIN ) 500 mcg in sodium chloride  0.9 % 250 mL (2 mcg/mL) infusion  50 mcg/hr Intravenous Continuous Cleotilde Rogue, MD 25 mL/hr at 09/08/23 0446 50 mcg/hr at 09/08/23 0446   ondansetron  (ZOFRAN ) tablet 4 mg  4 mg Oral Q6H PRN Emokpae, Ejiroghene E, MD       Or   ondansetron  (ZOFRAN ) injection 4 mg  4 mg Intravenous Q6H PRN Emokpae, Ejiroghene E, MD   4 mg at 09/08/23 9075   Oral care mouth rinse  15 mL Mouth Rinse PRN Emokpae, Ejiroghene E, MD       pantoprazole  (PROTONIX ) injection 40 mg  40 mg Intravenous Q12H Emokpae, Ejiroghene E, MD   40 mg at 09/08/23 0616   polyethylene glycol (MIRALAX  / GLYCOLAX ) packet 17 g  17 g Oral Daily PRN Emokpae, Ejiroghene E, MD       thiamine  (VITAMIN B1) tablet 100 mg  100 mg Oral Daily Emokpae, Ejiroghene E, MD   100 mg at 09/08/23 9145   Or   thiamine  (VITAMIN B1) injection 100 mg  100 mg Intravenous Daily Emokpae, Ejiroghene E, MD        Allergies as of 09/07/2023   (No Known Allergies)    Family History  Problem Relation Age of Onset   Heart failure Mother    COPD Mother    Atrial fibrillation Mother    Idiopathic pulmonary fibrosis Father     Social History   Socioeconomic History   Marital status: Married    Spouse name: Not on file   Number of children: Not on file   Years of education: Not on file   Highest education level: Not on file  Occupational History   Not on file  Tobacco Use   Smoking status: Former    Current packs/day: 0.00    Average packs/day: 2.0 packs/day for 20.0 years (40.0 ttl pk-yrs)    Types: Cigarettes    Start date: 01/31/1970    Quit date: 01/31/1990    Years since quitting: 33.6   Smokeless tobacco: Never  Vaping Use   Vaping status: Never Used  Substance and Sexual Activity   Alcohol use: Yes    Comment: 4-5 beer daily   Drug use: No  Sexual activity: Not on file  Other Topics Concern   Not on file  Social History Narrative   Right handed.    Social Drivers of Manufacturing engineer Strain: High Risk (06/15/2023)   Overall Financial Resource Strain (CARDIA)    Difficulty of Paying Living Expenses: Very hard  Food Insecurity: No Food Insecurity (09/07/2023)   Hunger Vital Sign    Worried About Running Out of Food in the Last Year: Never true    Ran Out of Food in the Last Year: Never true  Recent Concern: Food Insecurity - Food Insecurity Present (06/15/2023)   Hunger Vital Sign    Worried About Running Out of Food in the Last Year: Sometimes true    Ran Out of Food in the Last Year: Sometimes true  Transportation Needs: No Transportation Needs (09/07/2023)   PRAPARE - Administrator, Civil Service (Medical): No    Lack of Transportation (Non-Medical): No  Physical Activity: Inactive (06/15/2023)   Exercise Vital Sign    Days of Exercise per Week: 0 days    Minutes of Exercise per Session: 0 min  Stress: No Stress Concern Present (06/15/2023)   [employer] of Occupational Health - Occupational Stress Questionnaire    Feeling of Stress : Only a little  Social Connections: Moderately Integrated (09/07/2023)   Social Connection and Isolation Panel    Frequency of Communication with Friends and Family: More than three times a week    Frequency of Social Gatherings with Friends and Family: More than three times a week    Attends Religious Services: Patient declined    Database administrator or Organizations: Yes    Attends Engineer, structural: More than 4 times per year    Marital Status: Married  Catering manager Violence: Not At Risk (09/07/2023)   Humiliation, Afraid, Rape, and Kick questionnaire    Fear of Current or Ex-Partner: No    Emotionally Abused: No    Physically Abused: No    Sexually Abused: No     Review of Systems   Gen: Denies any fever, chills, loss of appetite, change in weight or weight loss CV: Denies chest pain, heart palpitations, syncope, edema  Resp: + mild shortness of breath. Denies  cough,  wheezing, coughing up blood, and pleurisy. GI: see HPI  Derm: Denies rash, itching, dry skin, hives. Psych: Denies depression, anxiety, memory loss, hallucinations, and confusion. Heme: + bleeding. Denies bruising Neuro:  Denies any headaches, dizziness, paresthesias, shaking  Physical Exam   Vital Signs in last 24 hours: Temp:  [97.6 F (36.4 C)-98 F (36.7 C)] 97.8 F (36.6 C) (08/08 0741) Pulse Rate:  [72-101] 94 (08/08 1000) Resp:  [11-33] 19 (08/08 1000) BP: (87-115)/(47-71) 87/55 (08/08 1000) SpO2:  [92 %-100 %] 92 % (08/08 1000) Weight:  [119.3 kg-120 kg] 119.3 kg (08/07 2251) Last BM Date : 09/07/23  General:   Alert,  pleasant and cooperative in NAD Head:  Normocephalic and atraumatic. Eyes:  Sclera clear, no icterus.   Conjunctiva pink. Ears:  Normal auditory acuity. Lungs:  Clear throughout to auscultation.   No wheezes, crackles, or rhonchi. No acute distress. Heart:  Regular rate and rhythm; no murmurs, clicks, rubs,  or gallops. Abdomen:  Soft, nontender and non-distended, rounded. No masses, hepatosplenomegaly or hernias noted. Normal bowel sounds, without guarding, and without rebound.   Rectal: deferred   Msk:  Symmetrical without gross deformities. Normal posture. Extremities:  Without clubbing or edema.  Neurologic:  Alert and  oriented x4. Skin:  Intact without significant lesions or rashes. Psych:  Alert and cooperative. Normal mood and affect.  Intake/Output from previous day: 08/07 0701 - 08/08 0700 In: 1135.6 [I.V.:215.2; Blood:630; IV Piggyback:290.4] Out: 200 [Urine:200] Intake/Output this shift: No intake/output data recorded.  Wt Readings from Last 3 Encounters:  09/07/23 119.3 kg  08/28/23 120.3 kg  08/23/23 120.3 kg   Labs/Studies   Recent Labs Recent Labs    09/07/23 2047 09/08/23 0417 09/08/23 0907  WBC 5.3 2.8*  --   HGB 6.7* 7.4* 7.0*  HCT 21.0* 22.1* 21.2*  PLT 194 141*  --    BMET Recent Labs    09/07/23 2047  09/08/23 0417  NA 139 137  K 3.3* 4.3  CL 106 108  CO2 20* 23  GLUCOSE 177* 137*  BUN 22 22  CREATININE 0.79 0.65  CALCIUM  8.1* 7.8*   LFT Recent Labs    09/07/23 2047  PROT 5.2*  ALBUMIN 2.5*  AST 14*  ALT 8  ALKPHOS 42  BILITOT 0.6   PT/INR Recent Labs    09/07/23 2047  LABPROT 16.6*  INR 1.3*   Hepatitis Panel No results for input(s): HEPBSAG, HCVAB, HEPAIGM, HEPBIGM in the last 72 hours. C-Diff No results for input(s): CDIFFTOX in the last 72 hours.  Radiology/Studies DG Chest Port 1 View Result Date: 09/07/2023 CLINICAL DATA:  Vomiting blood EXAM: PORTABLE CHEST 1 VIEW COMPARISON:  08/29/2023 FINDINGS: Stable cardiomegaly. Similar partially loculated small left pleural effusion. Probable trace right pleural effusion. No pneumothorax. Diffuse interstitial coarsening. Left basilar airspace opacities. IMPRESSION: 1. Cardiomegaly with interstitial pulmonary edema. 2. Similar partially loculated small left pleural effusion. Probable trace right pleural effusion. 3. Left basilar airspace opacities may be due to atelectasis or infection. Electronically Signed   By: Norman Gatlin M.D.   On: 09/07/2023 20:57     Assessment   ISRAEL WERTS is a 73 y.o. year old male with history of A-fib on Eliquis , gout, alcohol abuse, OSA, ? stroke, and stage IV lung cancer s/p recent completion of chemo and radiation who presented to the ED with complaints of black/tarry stools, dizziness and weakness and subsequent hematemesis on EMS arrival.  GI consulted for further evaluation.  Anemia, hematemesis, melena, coffee ground emesis: - Hypotensive at home and on arrival - Hgb 6.7 on presentation - Last dose eliquis  8/7 at noon - 1 episode of hematemesis on EMS arrival, subsequent episodes have been coffee-ground with ongoing melena. - Abdominal imaging in March without signs of cirrhosis, esophageal variceal bleeding less likely although he does have some mild thrombocytopenia  which could be secondary to recent chemo therapy - Denies any frequent NSAID use. - Differentials include esophagitis, gastritis, gastropathy, duodenitis, peptic ulcer disease, AVMs in the setting of recent radiation use and alcohol abuse. - Continue IV PPI twice daily, although variceal bleeding less likely, would be okay to continue octreotide  for now.  Discussed plan for upper endoscopy tomorrow with Dr. Shaaron, cannot be done today given it is only been 24 hours since last dose of Eliquis , need to complete 48-hour washout.  I have discussed the risks, alternatives, benefits with regards to but not limited to the risk of reaction to medication, bleeding, infection, perforation and the patient is agreeable to proceed. Written consent to be obtained.  Plan / Recommendations   Hold Eliquis  NPO EGD tomorrow with Dr. Shaaron. PPI BID Okay to continue octreotide  for now Trend H/H, transfuse Hgb <7 If  he wishes to follow with our office we can request prior colonoscopy records.    09/08/2023, 10:17 AM  Charmaine [name], MSN, FNP-BC, AGACNP-BC [facility]

## 2023-09-08 NOTE — Progress Notes (Signed)
 Spoke with patient about CPAP/BIPAP he declined use since he has oxygen [place] on. Machine is available if he needs.

## 2023-09-09 ENCOUNTER — Inpatient Hospital Stay (HOSPITAL_COMMUNITY)

## 2023-09-09 ENCOUNTER — Encounter (HOSPITAL_COMMUNITY)
Admission: EM | Disposition: A | Discharge status: Home / Self Care | Payer: Self-pay | Source: Home / Self Care | Attending: Family Medicine

## 2023-09-09 ENCOUNTER — Encounter (HOSPITAL_COMMUNITY): Payer: Self-pay | Admitting: Internal Medicine

## 2023-09-09 DIAGNOSIS — K254 Chronic or unspecified gastric ulcer with hemorrhage: Secondary | ICD-10-CM | POA: Diagnosis not present

## 2023-09-09 DIAGNOSIS — I480 Paroxysmal atrial fibrillation: Secondary | ICD-10-CM | POA: Diagnosis not present

## 2023-09-09 DIAGNOSIS — F1010 Alcohol abuse, uncomplicated: Secondary | ICD-10-CM | POA: Diagnosis not present

## 2023-09-09 DIAGNOSIS — D649 Anemia, unspecified: Secondary | ICD-10-CM | POA: Diagnosis not present

## 2023-09-09 DIAGNOSIS — K264 Chronic or unspecified duodenal ulcer with hemorrhage: Secondary | ICD-10-CM | POA: Diagnosis not present

## 2023-09-09 DIAGNOSIS — I890 Lymphedema, not elsewhere classified: Secondary | ICD-10-CM | POA: Diagnosis not present

## 2023-09-09 HISTORY — PX: IR EMBO ART  VEN HEMORR LYMPH EXTRAV  INC GUIDE ROADMAPPING: IMG5450

## 2023-09-09 HISTORY — PX: IR US GUIDE VASC ACCESS RIGHT: IMG2390

## 2023-09-09 HISTORY — PX: IR ANGIOGRAM VISCERAL SELECTIVE: IMG657

## 2023-09-09 HISTORY — PX: IR ANGIOGRAM SELECTIVE EACH ADDITIONAL VESSEL: IMG667

## 2023-09-09 HISTORY — PX: ESOPHAGOGASTRODUODENOSCOPY: SHX5428

## 2023-09-09 LAB — CBC
HCT: 21.1 % — ABNORMAL LOW (ref 39.0–52.0)
Hemoglobin: 6.7 g/dL — CL (ref 13.0–17.0)
MCH: 29.8 pg (ref 26.0–34.0)
MCHC: 31.8 g/dL (ref 30.0–36.0)
MCV: 93.8 fL (ref 80.0–100.0)
Platelets: 147 K/uL — ABNORMAL LOW (ref 150–400)
RBC: 2.25 MIL/uL — ABNORMAL LOW (ref 4.22–5.81)
RDW: 21.4 % — ABNORMAL HIGH (ref 11.5–15.5)
WBC: 2.9 K/uL — ABNORMAL LOW (ref 4.0–10.5)
nRBC: 0 % (ref 0.0–0.2)

## 2023-09-09 LAB — BASIC METABOLIC PANEL WITH GFR
Anion gap: 6 (ref 5–15)
BUN: 21 mg/dL (ref 8–23)
CO2: 25 mmol/L (ref 22–32)
Calcium: 7.9 mg/dL — ABNORMAL LOW (ref 8.9–10.3)
Chloride: 108 mmol/L (ref 98–111)
Creatinine, Ser: 0.68 mg/dL (ref 0.61–1.24)
GFR, Estimated: >60 mL/min (ref >60)
Glucose, Bld: 106 mg/dL — ABNORMAL HIGH (ref 70–99)
Potassium: 3.5 mmol/L (ref 3.5–5.1)
Sodium: 139 mmol/L (ref 135–145)

## 2023-09-09 LAB — TYPE AND SCREEN
ABO/RH(D): O NEG
Antibody Screen: NEGATIVE

## 2023-09-09 LAB — GLUCOSE, CAPILLARY: Glucose-Capillary: 117 mg/dL — ABNORMAL HIGH (ref 70–99)

## 2023-09-09 LAB — PREPARE RBC (CROSSMATCH)

## 2023-09-09 LAB — HEMOGLOBIN AND HEMATOCRIT, BLOOD
HCT: 28.5 % — ABNORMAL LOW (ref 39.0–52.0)
Hemoglobin: 9.3 g/dL — ABNORMAL LOW (ref 13.0–17.0)

## 2023-09-09 SURGERY — ESOPHAGOSCOPY
Anesthesia: Choice

## 2023-09-09 SURGERY — EGD (ESOPHAGOGASTRODUODENOSCOPY)
Anesthesia: Monitor Anesthesia Care

## 2023-09-09 MED ORDER — IOHEXOL 300 MG/ML  SOLN
100.0000 mL | Freq: Once | INTRAMUSCULAR | Status: AC | PRN
  Administered 2023-09-09: 55 mL via INTRA_ARTERIAL

## 2023-09-09 MED ORDER — PROPOFOL 10 MG/ML IV BOLUS
INTRAVENOUS | Status: DC | PRN
  Administered 2023-09-09: 20 mg via INTRAVENOUS

## 2023-09-09 MED ORDER — POTASSIUM CHLORIDE 10 MEQ/100ML IV SOLN: 10.0000 meq | INTRAVENOUS | Status: AC

## 2023-09-09 MED ORDER — LACTATED RINGERS IV SOLN: INTRAVENOUS | Status: DC

## 2023-09-09 MED ORDER — ALBUTEROL SULFATE (2.5 MG/3ML) 0.083% IN NEBU: 2.5000 mg | INHALATION_SOLUTION | RESPIRATORY_TRACT | Status: DC | PRN

## 2023-09-09 MED ORDER — SODIUM CHLORIDE 0.9 % IV SOLN: INTRAVENOUS | Status: DC

## 2023-09-09 MED ORDER — SODIUM CHLORIDE 0.9% IV SOLUTION: Freq: Once | INTRAVENOUS | Status: DC

## 2023-09-09 MED ORDER — LACTATED RINGERS IV SOLN: INTRAVENOUS | Status: DC | PRN

## 2023-09-09 MED ORDER — MIDAZOLAM HCL 2 MG/2ML IJ SOLN
INTRAMUSCULAR | Status: DC | PRN
  Administered 2023-09-09: 2 mg via INTRAVENOUS

## 2023-09-09 MED ORDER — PANTOPRAZOLE SODIUM 40 MG IV SOLR: 40.0000 mg | Freq: Two times a day (BID) | INTRAVENOUS | Status: DC

## 2023-09-09 MED ORDER — SODIUM CHLORIDE 0.9% IV SOLUTION: Freq: Once | INTRAVENOUS | Status: AC

## 2023-09-09 MED ORDER — LIDOCAINE HCL 1 % IJ SOLN
20.0000 mL | Freq: Once | INTRAMUSCULAR | Status: AC
  Administered 2023-09-09: 20 mL

## 2023-09-09 MED ORDER — MIDAZOLAM HCL 2 MG/2ML IJ SOLN
INTRAMUSCULAR | Status: AC
  Filled 2023-09-09: qty 2

## 2023-09-09 MED ORDER — METOPROLOL TARTRATE 5 MG/5ML IV SOLN
INTRAVENOUS | Status: DC | PRN
Start: 2023-09-09 — End: 2023-09-09
  Administered 2023-09-09: 2 mg via INTRAVENOUS

## 2023-09-09 MED ORDER — FENTANYL CITRATE (PF) 100 MCG/2ML IJ SOLN
INTRAMUSCULAR | Status: AC | PRN
  Administered 2023-09-09: 50 ug via INTRAVENOUS

## 2023-09-09 MED ORDER — MIDAZOLAM HCL 2 MG/2ML IJ SOLN
INTRAMUSCULAR | Status: AC | PRN
  Administered 2023-09-09: 1 mg via INTRAVENOUS

## 2023-09-09 MED ORDER — FENTANYL CITRATE (PF) 100 MCG/2ML IJ SOLN
INTRAMUSCULAR | Status: AC
  Filled 2023-09-09: qty 2

## 2023-09-09 MED ORDER — POTASSIUM CHLORIDE 10 MEQ/100ML IV SOLN
10.0000 meq | INTRAVENOUS | Status: AC
  Administered 2023-09-09 (×3): 10 meq via INTRAVENOUS
  Filled 2023-09-09 (×3): qty 100

## 2023-09-09 MED ORDER — FENTANYL CITRATE (PF) 100 MCG/2ML IJ SOLN
INTRAMUSCULAR | Status: AC
Start: 2023-09-09 — End: 2023-09-09
  Filled 2023-09-09: qty 2

## 2023-09-09 MED ORDER — PANTOPRAZOLE SODIUM 40 MG IV SOLR: 40.0000 mg | INTRAVENOUS | Status: AC

## 2023-09-09 MED ORDER — FENTANYL CITRATE (PF) 100 MCG/2ML IJ SOLN
INTRAMUSCULAR | Status: DC | PRN
  Administered 2023-09-09: 100 ug via INTRAVENOUS

## 2023-09-09 MED ORDER — LIDOCAINE HCL 1 % IJ SOLN
INTRAMUSCULAR | Status: AC
  Filled 2023-09-09: qty 20

## 2023-09-09 MED ORDER — PANTOPRAZOLE SODIUM 40 MG IV SOLR
40.0000 mg | Freq: Three times a day (TID) | INTRAVENOUS | Status: DC
  Administered 2023-09-09 – 2023-09-11 (×6): 40 mg via INTRAVENOUS
  Filled 2023-09-09 (×5): qty 10

## 2023-09-09 MED ORDER — PANTOPRAZOLE SODIUM 40 MG IV SOLR
40.0000 mg | INTRAVENOUS | Status: AC
  Administered 2023-09-09 (×2): 40 mg via INTRAVENOUS
  Filled 2023-09-09 (×2): qty 10

## 2023-09-09 MED ORDER — SODIUM CHLORIDE (PF) 0.9 % IJ SOLN
PREFILLED_SYRINGE | INTRAMUSCULAR | Status: DC | PRN
  Administered 2023-09-09: 6 mL

## 2023-09-09 MED ORDER — STERILE WATER FOR IRRIGATION IR SOLN
Status: DC | PRN
  Administered 2023-09-09: 120 mL

## 2023-09-09 NOTE — Op Note (Signed)
 [facility] Patient Name: Raymond Hurst Procedure Date: 09/09/2023 8:53 AM MRN: [id number] Date of Birth: 1950-02-25 Attending MD: Lamar Ozell Hollingshead , MD, [id number] CSN: [id number] Age: 73 Admit Type: Outpatient Procedure:                Upper GI endoscopy Indications:              Hematemesis, Melena Providers:                Lamar Ozell Hollingshead, MD, Leandrew Edelman RN, RN, [place]                            Wilson, Technician Referring MD:              Medicines:                Propofol  per Anesthesia Complications:            No immediate complications. Estimated Blood Loss:     Estimated blood loss was minimal. Procedure:                Pre-Anesthesia Assessment:                           - Prior to the procedure, a History and Physical                            was performed, and patient medications and                            allergies were reviewed. The patient's tolerance of                            previous anesthesia was also reviewed. The risks                            and benefits of the procedure and the sedation                            options and risks were discussed with the patient.                            All questions were answered, and informed consent                            was obtained. Prior Anticoagulants: The patient                            last took Eliquis  (apixaban ) 3 days prior to the                            procedure. ASA Grade Assessment: III - A patient                            with severe systemic disease. After reviewing the  risks and benefits, the patient was deemed in                            satisfactory condition to undergo the procedure.                           After obtaining informed consent, the endoscope was                            passed under direct vision. Throughout the                            procedure, the patient's blood pressure, pulse, and                             oxygen saturations were monitored continuously. The                            GIF-H190 ([id number]) scope was introduced through the                            mouth, and advanced to the second part of duodenum.                            The upper GI endoscopy was accomplished without                            difficulty. The patient tolerated the procedure                            well. Scope In: 10:01:02 AM Scope Out: 10:14:22 AM Total Procedure Duration: 0 hours 13 minutes 20 seconds  Findings:      The examined esophagus was normal. Blood tinged fluid and stomach most       of this was suctioned out; small amount of blood and adherent clot was       present in the fundus with which precluded complete evaluation. Mucosa       seen appeared normal. Pylorus patent. Examination the bulb second       portion revealed a 1.5 cm cratered ulcer straddling D1 and D2. There       appeared to be a broad-based adherent clot/pigmented protuberance in the       base. Findings suspicious for large arterial branch. Surrounding       friability.      This lesion felt to be too large to be definitively dealt with employing       endoscopic bleeding control therapy. The ulcer crater was ring injected       with a total of 6 cc of 1-10,000 epinephrine . Impression:               - Normal esophagus. Blood and fluid in stomach. No                            gross mucosal abnormality seen.                           -  Deep cratered ulcer in the duodenum as described.                            Broad-based visible vessel/pigmented protuberance                            present.                           - Definitive bleeding control therapy of the                            duodenal ulcer felt not to be feasible with                            endoscopic therapy                           - Ulcer crater ring injected with epinephrine . Moderate Sedation:      Moderate (conscious) sedation was personally  administered by an       anesthesia professional. The following parameters were monitored: oxygen       saturation, heart rate, blood pressure, respiratory rate, EKG, adequacy       of pulmonary ventilation, and response to care. Recommendation:           Transfer to [facility] for urgent IR intervention.                            I spoke with Dr. Juliene Balder who is kindly agreed to                            see the patient. Patient will be transferred to the                            hospitalist there and IR will be consulted.                           I spoke to patient and spouse, Finlay Godbee, spouse,                            regarding findings and recommendations. Their                            questions were answered. They are agreeable.                            Octreotide  can be stopped. Procedure Code(s):        --- Professional ---                           [phone], Esophagogastroduodenoscopy, flexible,                            transoral; diagnostic, including collection of  specimen(s) by brushing or washing, when performed                            (separate procedure) Diagnosis Code(s):        --- Professional ---                           K92.0, Hematemesis                           K92.1, Melena (includes Hematochezia) CPT copyright 2022 American Medical Association. All rights reserved. The codes documented in this report are preliminary and upon coder review may  be revised to meet current compliance requirements. Lamar HERO. Carlei Huang, MD Lamar Ozell Hollingshead, MD 09/09/2023 10:59:09 AM This report has been signed electronically. Number of Addenda: 0

## 2023-09-09 NOTE — Transfer of Care (Signed)
 Immediate Anesthesia Transfer of Care Note  Patient: Raymond Hurst  Procedure(s) Performed: EGD (ESOPHAGOGASTRODUODENOSCOPY)  Patient Location: PACU  Anesthesia Type:MAC  Level of Consciousness: awake  Airway & Oxygen Therapy: Patient Spontanous Breathing  Post-op Assessment: Report given to RN and Post -op Vital signs reviewed and stable  Post vital signs: Reviewed and stable  Last Vitals:  Vitals Value Taken Time  BP    Temp 37 C 09/09/23 10:22  Pulse    Resp    SpO2      Last Pain:  Vitals:   09/09/23 0953  TempSrc:   PainSc: 0-No pain         Complications: No notable events documented.

## 2023-09-09 NOTE — Consult Note (Signed)
 Chief Complaint: GI Bleed  Referring Provider(s): Courage Emokpae  Supervising Physician: Vanice Revel  Patient Status: [facility] - In-pt  History of Present Illness: Raymond Hurst is a 73 y.o. male with atrial fibrillation on Eliquis , gout, stage IV lung cancer, and alcohol abuse.  He was admitted on 09/07/23 with Acute GI Bleed with a Hgb of 6.7   EGD done today showed =     [facility] with GDA embolization.  He is NPO.   Patient is Full Code  Past Medical History:  Diagnosis Date   Adrenal tumor    right adrenal gland tumor favored to be a myelolipoma   Atrial fibrillation (HCC)    Cellulitis of right lower leg    Dysrhythmia    A-fib   History of TIA (transient ischemic attack)    May 2020   Lymphedema    OSA on CPAP    severe obstructive sleep apnea with an AHI of 54/h with no significant central events.  He had nocturnal hypoxemia with O2 sat nadir of 70%. Now on CPAP   Pulmonary mass    Stroke [facility])     Past Surgical History:  Procedure Laterality Date   BRONCHIAL BIOPSY  06/12/2023   Procedure: BRONCHOSCOPY, WITH BIOPSY;  Surgeon: Shelah Lamar RAMAN, MD;  Location: [facility] ENDOSCOPY;  Service: Cardiopulmonary;;   BRONCHIAL BRUSHINGS  06/12/2023   Procedure: BRONCHOSCOPY, WITH BRUSH BIOPSY;  Surgeon: Shelah Lamar RAMAN, MD;  Location: [facility] ENDOSCOPY;  Service: Cardiopulmonary;;   BRONCHIAL NEEDLE ASPIRATION BIOPSY  06/12/2023   Procedure: BRONCHOSCOPY, WITH NEEDLE ASPIRATION BIOPSY;  Surgeon: Shelah Lamar RAMAN, MD;  Location: [facility] ENDOSCOPY;  Service: Cardiopulmonary;;   BRONCHIAL NEEDLE ASPIRATION BIOPSY  08/28/2023   Procedure: BRONCHOSCOPY, WITH NEEDLE ASPIRATION BIOPSY;  Surgeon: Shelah Lamar RAMAN, MD;  Location: [facility] ENDOSCOPY;  Service: Pulmonary;;   CRYOTHERAPY  08/28/2023   Procedure: CRYOTHERAPY;  Surgeon: Shelah Lamar RAMAN, MD;  Location: [facility] ENDOSCOPY;  Service: Pulmonary;;   HEMOSTASIS CLIP PLACEMENT  06/12/2023    Procedure: CONTROL OF HEMORRHAGE bronch cold saline and epi;  Surgeon: Shelah Lamar RAMAN, MD;  Location: [facility] ENDOSCOPY;  Service: Cardiopulmonary;;   INNER EAR SURGERY     KNEE SURGERY Left    THORACENTESIS Left 06/06/2023   Procedure: THORACENTESIS;  Surgeon: Arlinda Ranks, MD;  Location: [facility] ENDOSCOPY;  Service: Pulmonary;  Laterality: Left;   TONSILLECTOMY     VIDEO BRONCHOSCOPY WITH ENDOBRONCHIAL NAVIGATION Left 08/28/2023   Procedure: VIDEO BRONCHOSCOPY WITH ENDOBRONCHIAL NAVIGATION;  Surgeon: Shelah Lamar RAMAN, MD;  Location: [facility] ENDOSCOPY;  Service: Pulmonary;  Laterality: Left;   VIDEO BRONCHOSCOPY WITH ENDOBRONCHIAL ULTRASOUND Left 06/12/2023   Procedure: BRONCHOSCOPY, WITH EBUS;  Surgeon: Shelah Lamar RAMAN, MD;  Location: [facility] ENDOSCOPY;  Service: Cardiopulmonary;  Laterality: Left;   VIDEO BRONCHOSCOPY WITH ENDOBRONCHIAL ULTRASOUND  08/28/2023   Procedure: BRONCHOSCOPY, WITH EBUS;  Surgeon: Shelah Lamar RAMAN, MD;  Location: [facility] ENDOSCOPY;  Service: Pulmonary;;    Allergies: Patient has no known allergies.  Medications: Prior to Admission medications   Medication Sig Start Date End Date Taking? Authorizing Provider  acetaminophen  (TYLENOL ) 500 MG tablet Take 500 mg by mouth every 6 (six) hours as needed for mild pain.   Yes [provider]  allopurinol  (ZYLOPRIM ) 100 MG tablet Take 100 mg by mouth daily. 05/25/22  Yes [provider]  apixaban  (ELIQUIS ) 5 MG TABS tablet Take 1 tablet (5 mg total) by mouth 2 (two) times  daily. Okay to restart this medication on 08/29/2023 08/28/23  Yes Shelah Lamar RAMAN, MD  cholecalciferol  (VITAMIN D3) 25 MCG (1000 UNIT) tablet Take 1,000 Units by mouth daily.   Yes [provider]  cyanocobalamin  (VITAMIN B12) 100 MCG tablet Take 100 mcg by mouth daily.   Yes [provider]  furosemide  (LASIX ) 40 MG tablet Take 20 mg by mouth daily.   Yes [provider]  HYDROcodone  bit-homatropine (HYCODAN) 5-1.5 MG/5ML syrup Take 5 mLs  by mouth every 6 (six) hours as needed for cough. 07/03/23  Yes Meade Verdon RAMAN, MD  levalbuterol  (XOPENEX  HFA) 45 MCG/ACT inhaler Inhale 1 puff into the lungs every 4 (four) hours as needed for wheezing. 07/26/23 07/25/24 Yes Meade Verdon RAMAN, MD  loperamide (IMODIUM A-D) 2 MG tablet Take 2-4 mg by mouth 4 (four) times daily as needed for diarrhea or loose stools.   Yes [provider]  metoprolol  tartrate (LOPRESSOR ) 25 MG tablet Take 1 tablet (25 mg total) by mouth 2 (two) times daily. 11/17/21  Yes Strader, [place] M, PA-C  ondansetron  (ZOFRAN ) 8 MG tablet Take 1 tablet (8 mg total) by mouth every 8 (eight) hours as needed (As needed for nausea starting 3 days after chemo). 07/07/23  Yes Cloretta Arley NOVAK, MD  prochlorperazine  (COMPAZINE ) 10 MG tablet Take 1 tablet (10 mg total) by mouth every 6 (six) hours as needed for nausea or vomiting. 07/07/23  Yes Cloretta Arley NOVAK, MD  simethicone  (MYLICON) 125 MG chewable tablet Chew 125 mg by mouth every 6 (six) hours as needed for flatulence.   Yes [provider]  thiamine  (VITAMIN B-1) 100 MG tablet Take 100 mg by mouth daily.   Yes [provider]     Family History  Problem Relation Age of Onset   Heart failure Mother    COPD Mother    Atrial fibrillation Mother    Idiopathic pulmonary fibrosis Father     Social History   Socioeconomic History   Marital status: Married    Spouse name: Not on file   Number of children: Not on file   Years of education: Not on file   Highest education level: Not on file  Occupational History   Not on file  Tobacco Use   Smoking status: Former    Current packs/day: 0.00    Average packs/day: 2.0 packs/day for 20.0 years (40.0 ttl pk-yrs)    Types: Cigarettes    Start date: 01/31/1970    Quit date: 01/31/1990    Years since quitting: 33.6   Smokeless tobacco: Never  Vaping Use   Vaping status: Never Used  Substance and Sexual Activity   Alcohol use: Yes    Comment: 4-5 beer daily    Drug use: No   Sexual activity: Not on file  Other Topics Concern   Not on file  Social History Narrative   Right handed.    Social Drivers of Corporate investment banker Strain: High Risk (06/15/2023)   Overall Financial Resource Strain (CARDIA)    Difficulty of Paying Living Expenses: Very hard  Food Insecurity: No Food Insecurity (09/07/2023)   Hunger Vital Sign    Worried About Running Out of Food in the Last Year: Never true    Ran Out of Food in the Last Year: Never true  Recent Concern: Food Insecurity - Food Insecurity Present (06/15/2023)   Hunger Vital Sign    Worried About Running Out of Food in the Last Year: Sometimes true  Ran Out of Food in the Last Year: Sometimes true  Transportation Needs: No Transportation Needs (09/07/2023)   PRAPARE - Administrator, Civil Service (Medical): No    Lack of Transportation (Non-Medical): No  Physical Activity: Inactive (06/15/2023)   Exercise Vital Sign    Days of Exercise per Week: 0 days    Minutes of Exercise per Session: 0 min  Stress: No Stress Concern Present (06/15/2023)   [employer] of Occupational Health - Occupational Stress Questionnaire    Feeling of Stress : Only a little  Social Connections: Moderately Integrated (09/07/2023)   Social Connection and Isolation Panel    Frequency of Communication with Friends and Family: More than three times a week    Frequency of Social Gatherings with Friends and Family: More than three times a week    Attends Religious Services: Patient declined    Database administrator or Organizations: Yes    Attends Engineer, structural: More than 4 times per year    Marital Status: Married     Review of Systems: A 12 point ROS discussed and pertinent positives are indicated in the HPI above.  All other systems are negative.  Review of Systems  Constitutional:  Positive for activity change and fatigue.  Gastrointestinal:        Hematemesis  Neurological:   Positive for dizziness.    Vital Signs: BP 107/67 (BP Location: Right Arm)   Pulse 86   Temp 97.8 F (36.6 C) (Oral)   Resp (!) 21   Ht 6' (1.829 m)   Wt 263 lb 0.1 oz (119.3 kg)   SpO2 99%   BMI 35.67 kg/m   Advance Care Plan: The advanced care place/surrogate decision maker was discussed at the time of visit and the patient did not wish to discuss or was not able to name a surrogate decision maker or provide an advance care plan.  Physical Exam Vitals reviewed.  Constitutional:      Appearance: Normal appearance.  HENT:     Head: Normocephalic and atraumatic.  Eyes:     Extraocular Movements: Extraocular movements intact.  Cardiovascular:     Rate and Rhythm: Normal rate and regular rhythm.  Pulmonary:     Effort: Pulmonary effort is normal. No respiratory distress.     Breath sounds: Normal breath sounds.  Abdominal:     General: There is no distension.     Palpations: Abdomen is soft.     Tenderness: There is no abdominal tenderness.  Musculoskeletal:        General: Normal range of motion.     Cervical back: Normal range of motion.  Skin:    General: Skin is warm and dry.  Neurological:     General: No focal deficit present.     Mental Status: He is alert and oriented to person, place, and time.  Psychiatric:        Mood and Affect: Mood normal.        Behavior: Behavior normal.        Thought Content: Thought content normal.        Judgment: Judgment normal.     Imaging: DG Chest Port 1 View Result Date: 09/07/2023 CLINICAL DATA:  Vomiting blood EXAM: PORTABLE CHEST 1 VIEW COMPARISON:  08/29/2023 FINDINGS: Stable cardiomegaly. Similar partially loculated small left pleural effusion. Probable trace right pleural effusion. No pneumothorax. Diffuse interstitial coarsening. Left basilar airspace opacities. IMPRESSION: 1. Cardiomegaly with interstitial pulmonary edema. 2. Similar  partially loculated small left pleural effusion. Probable trace right pleural  effusion. 3. Left basilar airspace opacities may be due to atelectasis or infection. Electronically Signed   By: Norman Gatlin M.D.   On: 09/07/2023 20:57   CT SUPER D CHEST WO CONTRAST Result Date: 09/02/2023 CLINICAL DATA:  Left lung cancer post chemotherapy and radiation. Patient denies surgery. EXAM: CT CHEST WITHOUT CONTRAST TECHNIQUE: Multidetector CT imaging of the chest was performed using thin slice collimation for electromagnetic bronchoscopy planning purposes, without intravenous contrast. RADIATION DOSE REDUCTION: This exam was performed according to the departmental dose-optimization program which includes automated exposure control, adjustment of the mA and/or kV according to patient size and/or use of iterative reconstruction technique. COMPARISON:  PET-CT 06/20/2023 and chest CT 06/13/2023 as well as 06/05/2023 FINDINGS: Cardiovascular: Heart is normal in size. Left main and 3 vessel coronary artery disease. Thoracic aorta is normal in caliber. There is calcified plaque over the descending thoracic aorta. Remaining mediastinal structures catch that remaining vascular structures are unremarkable. Mediastinum/Nodes: No mediastinal adenopathy. No right hilar adenopathy. Patient has known large left lung mass is confluent with the left hilum. Lungs/Pleura: Lungs are adequately inflated. Continued evidence of patient's known left upper lobe lung cancer extending to the hilum as there is been interval decrease in size as this measures a proximally 4.7 x 8.0 cm (previously 6.6 x 10.6 cm). There is continued evidence of a moderate size left pleural effusion with less fluid over the left upper lobe/apex. Scarring over the lingula. Stable 3 mm nodule over the right lower lobe. Airways are normal. Upper Abdomen: Stable 6.4 cm right adrenal myelolipoma. Diverticulosis of the colon. Calcified plaque over the abdominal aorta which is normal in caliber. Right renal cyst over the upper pole measuring 4.8 cm. No  acute findings. Musculoskeletal: Stable chronic L1 compression fracture. IMPRESSION: 1. evidence of patient's known left upper lobe lung cancer extending to the hilum as there has been interval decrease in size as this measures 4.7 x 8.0 cm (previously 6.6 x 10.6 cm). 2. Stable moderate size left pleural effusion with less fluid over the left upper lobe/apex. 3. Stable 3 mm nodule over the right lower lobe. Recommend attention on follow-up. 4. Stable 6.4 cm right adrenal myelolipoma. 5. Diverticulosis of the colon. 6. Stable chronic L1 compression fracture. 7. Aortic atherosclerosis. Atherosclerotic coronary artery disease. Aortic Atherosclerosis (ICD10-I70.0). Electronically Signed   By: Toribio Agreste M.D.   On: 09/02/2023 15:01   Portable chest 1 View Result Date: 08/29/2023 CLINICAL DATA:  Pneumothorax. EXAM: PORTABLE CHEST 1 VIEW COMPARISON:  Chest radiograph dated 08/28/2023. FINDINGS: Small left pleural effusion. Left suprahilar opacity as seen on the prior radiograph and CT. No pneumothorax. Stable cardiomegaly. No acute osseous pathology. IMPRESSION: No interval change.  No pneumothorax. Electronically Signed   By: Vanetta Chou M.D.   On: 08/29/2023 12:12   DG Chest 1 View Result Date: 08/28/2023 CLINICAL DATA:  Status post bronchoscopy EXAM: CHEST  1 VIEW COMPARISON:  Chest x-ray 08/28/2023.  CT chest 08/26/2023. FINDINGS: Focal opacity in the left upper lobe appears unchanged. There is loculated fluid in the left lung apex. No definitive pneumothorax visualized. The right lung is clear. The cardiomediastinal silhouette is stable, the heart appears enlarged. The osseous structures are unchanged. IMPRESSION: 1. No definitive pneumothorax visualized. 2. Focal opacity in the left upper lobe appears unchanged. There is loculated fluid in the left lung apex. Electronically Signed   By: Greig Pique M.D.   On: 08/28/2023 16:43  DG Chest Port 1 View Result Date: 08/28/2023 CLINICAL DATA:  [id number]  S/P bronchoscopy with biopsy [id number] EXAM: PORTABLE CHEST - 1 VIEW COMPARISON:  Jun 13, 2023, August 26, 2023 FINDINGS: Triangular opacity in the left upper lung zone with superior elevation of the left oblique fissure. Likely pleural line in the left lung apex. Blunting of the left costophrenic sulcus, likely representing a small pleural effusion. Mild cardiomegaly. Aortic atherosclerosis. Fullness of the left AP window, corresponding to the left upper lobe mass. Multilevel thoracic osteophytosis. IMPRESSION: 1. Likely pleural line in the left lung apex, worrisome for a small, loculated pneumothorax. Triangular opacity in the left upper lobe, likely reflecting partial left upper lobe atelectasis/collapse. 2. Small left pleural effusion blunting the left costophrenic sulcus. Critical Value/emergent results were discussed by telephone at the time of interpretation on 08/28/2023 at 3:54 pm to provider LAMAR CHRIS, MD , who verbally acknowledged these results. Electronically Signed   By: Rogelia Myers M.D.   On: 08/28/2023 15:59   DG C-ARM BRONCHOSCOPY Result Date: 08/28/2023 C-ARM BRONCHOSCOPY: Fluoroscopy was utilized by the requesting physician.  No radiographic interpretation.    Labs:  CBC: Recent Labs    09/07/23 2047 09/08/23 0417 09/08/23 0907 09/08/23 1549 09/08/23 2004 09/09/23 0522  WBC 5.3 2.8*  --  3.0*  --  2.9*  HGB 6.7* 7.4* 7.0* 7.2* 7.3* 6.7*  HCT 21.0* 22.1* 21.2* 21.7* 22.0* 21.1*  PLT 194 141*  --  147*  --  147*    COAGS: Recent Labs    06/06/23 0638 06/13/23 0031 06/14/23 0422 09/07/23 2047  INR 1.2 1.1 1.3* 1.3*  APTT 32  --  32  --     BMP: Recent Labs    08/23/23 1100 09/07/23 2047 09/08/23 0417 09/09/23 0634  NA 137 139 137 139  K 3.7 3.3* 4.3 3.5  CL 103 106 108 108  CO2 23 20* 23 25  GLUCOSE 101* 177* 137* 106*  BUN 8 22 22 21   CALCIUM  9.3 8.1* 7.8* 7.9*  CREATININE 0.66 0.79 0.65 0.68  GFRNONAA >60 >60 >60 >60    LIVER FUNCTION  TESTS: Recent Labs    08/08/23 0828 08/14/23 0815 08/23/23 1100 09/07/23 2047  BILITOT 0.5 0.6 0.5 0.6  AST 11* 15 13* 14*  ALT 9 9 9 8   ALKPHOS 56 55 56 42  PROT 6.2* 6.3* 6.4* 5.2*  ALBUMIN 3.3* 3.5 3.4* 2.5*    TUMOR MARKERS: No results for input(s): AFPTM, CEA, CA199, CHROMGRNA in the last 8760 hours.  Assessment and Plan:  GI Bleed due to ulcer - Will proceed with mesenteric angiography today by Dr. Vanice.  The Risks and benefits of embolization were discussed with the patient including, but not limited to bleeding, infection, vascular injury, post operative pain, or contrast induced renal failure.  This procedure involves the use of X-rays and because of the nature of the planned procedure, it is possible that we will have prolonged use of X-ray fluoroscopy.  Potential radiation risks to you include (but are not limited to) the following: - A slightly elevated risk for cancer several years later in life. This risk is typically less than 0.5% percent. This risk is low in comparison to the normal incidence of human cancer, which is 33% for women and 50% for men according to the American Cancer Society. - Radiation induced injury can include skin redness, resembling a rash, tissue breakdown / ulcers and hair loss (which can be temporary or permanent).   The  likelihood of either of these occurring depends on the difficulty of the procedure and whether you are sensitive to radiation due to previous procedures, disease, or genetic conditions.   IF your procedure requires a prolonged use of radiation, you will be notified and given written instructions for further action.  It is your responsibility to monitor the irradiated area for the 2 weeks following the procedure and to notify your physician if you are concerned that you have suffered a radiation induced injury.    All of the patient's questions were answered, patient is agreeable to proceed. Consent signed and in  chart.   Electronically Signed: SARI GORMAN LAMP, PA-C   09/09/2023, 2:52 PM      I spent a total of 40 Minutes  in face to face in clinical consultation, greater than 50% of which was counseling/coordinating care for GDA embo.

## 2023-09-09 NOTE — Progress Notes (Signed)
 PROGRESS NOTE  Raymond Hurst, is a 73 y.o. male, DOB - 1950/08/19, FMW:[id number]  Admit date - 09/07/2023   Admitting Physician Tully FORBES Carwin, MD  Outpatient Primary MD for the patient is [facility]  LOS - 2  Chief Complaint  Patient presents with   Rectal Bleeding   Hematemesis      Brief Narrative:  73 y.o. male with medical history significant for atrial fibrillation on chronic anticoagulation, gout, GI bleed, stage IV lung cancer, alcohol abuse admitted on 09/07/23 with Acute Gi Bleed--with Hgb of 6.7 , baseline usually >> 10    -Assessment and Plan: 1)Acute GI bleed Admitted with melena, hematochezia and hematemesis =- Last dose of Eliquis  a.m. of 09/07/2023 -  Last CT abdomen-04/2023 no indication of cirrhosis. -- 09/09/23 --Had coffee-ground emesis overnight - Hemoglobin is  6.7 after transfusion of 2 units of PRBC -Will transfuse 2 additional units of PRBC on 09/09/2023 for total 4 units this admission -EGD on 09/09/23-----Cratered duodenal ulcer with exposed branch of the gastroduodenal artery. Pigmented protuberance.  injected epinephrine  in the periphery to temporize. It is more that can be treated definitively with endoscopy; he needs to go to [facility] ASAP for IR intervention. He remained stable. Discussed with Dr.  Juliene Balder IR doc in [place]. Needs transfer to hospitalist down there and they will consult Dr. Balder.   - Continue IV Protonix  per protocol -- Completed IV octreotide   2)Paroxysmal atrial fibrillation (HCC) Rate controlled and on anticoagulation with Eliquis  PTA  -Hold Eliquis  and metoprolol  due to #1 above  3) acute on chronic anemia--due to #1 above -Transfusions as above #1 - Management as above #1  Gout Hold allopurinol  while n.p.o.  Lung cancer, upper lobe (HCC) Follows with Dr. Cloretta.  Diagnosed 06/2023.  Per last notes 08/23/2023-patient completed last cycle of chemotherapy 7/23 and radiation 09/01/23.  Lymphedema of  leg Stable and at baseline.  Alcohol abuse Previously heavy drinker, has cut down.  His last drink was yesterday, drank 1- 12 ounce beer.  No history of seizures.  Denies history of alcohol withdrawals. -CIWA as needed -Thiamine , folate multivitamins when able  Acute hypoxic respiratory failure== patient with history of lung cancer - Currently requiring 2 L of oxygen - Try to wean off O2 as able - As needed bronchodilators  Status is: Inpatient   Disposition: The patient is from: Home              Anticipated d/c is to: Home              Anticipated d/c date is: 3 days              Patient currently is not medically stable to d/c. Barriers: Not Clinically Stable-   Code Status :  -  Code Status: Full Code   Family Communication:   (patient is alert, awake and coherent)  Discussed with wife and sister  DVT Prophylaxis  :   - SCDs  SCDs Start: 09/07/23 2308   Lab Results  Component Value Date   PLT 147 (L) 09/09/2023   Inpatient Medications  Scheduled Meds:  sodium chloride    Intravenous Once   sodium chloride    Intravenous Once   Chlorhexidine  Gluconate Cloth  6 each Topical Daily   folic acid   1 mg Oral Daily   multivitamin with minerals  1 tablet Oral Daily   pantoprazole  (PROTONIX ) IV  40 mg Intravenous Q12H   thiamine   100 mg Oral Daily  Or   thiamine   100 mg Intravenous Daily   Continuous Infusions:  potassium chloride      PRN Meds:.acetaminophen  **OR** acetaminophen , LORazepam  **OR** LORazepam , ondansetron  **OR** ondansetron  (ZOFRAN ) IV, mouth rinse, polyethylene glycol   Anti-infectives (From admission, onward)    None      Subjective: [name] today has no fevers,  No chest pain,    -Sister at bedside, questions answered - Tolerated endoscopy well - No further hematemesis  Objective: Vitals:   09/09/23 1022 09/09/23 1039 09/09/23 1045 09/09/23 1104  BP: 122/74 110/60 110/60 128/63  Pulse: 87 84 86 96  Resp: 20 (!) 23 20 20   Temp:  98.6 F (37 C) 97.6 F (36.4 C)  97.6 F (36.4 C)  TempSrc:  Oral  Oral  SpO2: 96% 93%  95%  Weight:      Height:        Intake/Output Summary (Last 24 hours) at 09/09/2023 1243 Last data filed at 09/09/2023 0947 Gross per 24 hour  Intake 356.67 ml  Output 900 ml  Net -543.33 ml   Filed Weights   09/07/23 2034 09/07/23 2251  Weight: 120 kg 119.3 kg   Physical Exam Gen:- Awake Alert,  in no apparent distress  HEENT:- [place].AT, No sclera icterus Nose--[place] 2L/min Neck-Supple Neck,No JVD,.  Lungs-  CTAB , fair symmetrical air movement CV- S1, S2 normal, regular  Abd-  +ve B.Sounds, Abd Soft, epigastric tenderness without rebound or guarding Extremity/Skin:- pedal pulses present , stable lymphedema Psych-affect is appropriate, oriented x3 Neuro-no new focal deficits, no tremors  Data Reviewed: I have personally reviewed following labs and imaging studies  CBC: Recent Labs  Lab 09/07/23 2047 09/08/23 0417 09/08/23 0907 09/08/23 1549 09/08/23 2004 09/09/23 0522  WBC 5.3 2.8*  --  3.0*  --  2.9*  NEUTROABS 4.4  --   --   --   --   --   HGB 6.7* 7.4* 7.0* 7.2* 7.3* 6.7*  HCT 21.0* 22.1* 21.2* 21.7* 22.0* 21.1*  MCV 93.3 92.5  --  93.1  --  93.8  PLT 194 141*  --  147*  --  147*   Basic Metabolic Panel: Recent Labs  Lab 09/07/23 2047 09/08/23 0417 09/09/23 0634  NA 139 137 139  K 3.3* 4.3 3.5  CL 106 108 108  CO2 20* 23 25  GLUCOSE 177* 137* 106*  BUN 22 22 21   CREATININE 0.79 0.65 0.68  CALCIUM  8.1* 7.8* 7.9*   GFR: Estimated Creatinine Clearance: 113 mL/min (by C-G formula based on SCr of 0.68 mg/dL). Liver Function Tests: Recent Labs  Lab 09/07/23 2047  AST 14*  ALT 8  ALKPHOS 42  BILITOT 0.6  PROT 5.2*  ALBUMIN 2.5*   Recent Results (from the past 240 hours)  MRSA Next Gen by PCR, Nasal     Status: None   Collection Time: 09/08/23  4:17 AM   Specimen: Nasal Mucosa; Nasal Swab  Result Value Ref Range Status   MRSA by PCR Next Gen NOT DETECTED NOT  DETECTED Final    Comment: (NOTE) The GeneXpert MRSA Assay (FDA approved for NASAL specimens only), is one component of a comprehensive MRSA colonization surveillance program. It is not intended to diagnose MRSA infection nor to guide or monitor treatment for MRSA infections. Test performance is not FDA approved in patients less than 12 years old. Performed at [facility], [address] [place], [place] [address]     Radiology Studies: [facility] 1 View Result  Date: 09/07/2023 CLINICAL DATA:  Vomiting blood EXAM: PORTABLE CHEST 1 VIEW COMPARISON:  08/29/2023 FINDINGS: Stable cardiomegaly. Similar partially loculated small left pleural effusion. Probable trace right pleural effusion. No pneumothorax. Diffuse interstitial coarsening. Left basilar airspace opacities. IMPRESSION: 1. Cardiomegaly with interstitial pulmonary edema. 2. Similar partially loculated small left pleural effusion. Probable trace right pleural effusion. 3. Left basilar airspace opacities may be due to atelectasis or infection. Electronically Signed   By: Norman Gatlin M.D.   On: 09/07/2023 20:57   Scheduled Meds:  sodium chloride    Intravenous Once   sodium chloride    Intravenous Once   Chlorhexidine  Gluconate Cloth  6 each Topical Daily   folic acid   1 mg Oral Daily   multivitamin with minerals  1 tablet Oral Daily   pantoprazole  (PROTONIX ) IV  40 mg Intravenous Q12H   thiamine   100 mg Oral Daily   Or   thiamine   100 mg Intravenous Daily   Continuous Infusions:  potassium chloride       LOS: 2 days   Rendall Carwin M.D on 09/09/2023 at 12:43 PM  Go to [email or web address] - for contact info  [facility] - Office  [phone]  If 7PM-7AM, please contact night-coverage [email or web address] 09/09/2023, 12:43 PM

## 2023-09-09 NOTE — Procedures (Signed)
 Interventional Radiology Procedure Note  Procedure: MESENTERIC ANGIO WITH GDA MICRO COIL EMBO    Complications: None  Estimated Blood Loss:  MIN  Findings: FULL REPORT IN PACS     EMERSON FREDERIC SPECKING, MD

## 2023-09-09 NOTE — Plan of Care (Signed)
  Problem: Safety: Goal: Ability to remain free from injury will improve Outcome: Progressing   Problem: Skin Integrity: Goal: Risk for impaired skin integrity will decrease Outcome: Progressing   Problem: Nutrition: Goal: Adequate nutrition will be maintained Outcome: Not Progressing   Problem: Elimination: Goal: Will not experience complications related to bowel motility Outcome: Not Progressing

## 2023-09-09 NOTE — Anesthesia Preprocedure Evaluation (Addendum)
 Anesthesia Evaluation  Patient identified by MRN, date of birth, ID band Patient awake    Reviewed: Allergy & Precautions, H&P , NPO status , Patient's Chart, lab work & pertinent test results  Airway Mallampati: III  TM Distance: >3 FB Neck ROM: Full    Dental  (+) Missing   Pulmonary sleep apnea , former smoker   breath sounds clear to auscultation       Cardiovascular + DOE  Normal cardiovascular exam+ dysrhythmias Atrial Fibrillation  Rhythm:Irregular Rate:Tachycardia  Due to GI bleed pt can not be anticoagulated therefore is at significant risk for stroke   Neuro/Psych  PSYCHIATRIC DISORDERS      TIACVA    GI/Hepatic ,,,(+)     substance abuse  alcohol useGI bleed/hematemesis   Endo/Other    Class 3 obesity  Renal/GU negative Renal ROS  negative genitourinary   Musculoskeletal negative musculoskeletal ROS (+)    Abdominal   Peds negative pediatric ROS (+)  Hematology  (+) Blood dyscrasia, anemia   Anesthesia Other Findings   Reproductive/Obstetrics negative OB ROS                              Anesthesia Physical Anesthesia Plan  ASA: 4 and emergent  Anesthesia Plan: MAC   Post-op Pain Management:    Induction:   PONV Risk Score and Plan:   Airway Management Planned: Nasal Cannula  Additional Equipment:   Intra-op Plan:   Post-operative Plan:   Informed Consent: I have reviewed the patients History and Physical, chart, labs and discussed the procedure including the risks, benefits and alternatives for the proposed anesthesia with the patient or authorized representative who has indicated his/her understanding and acceptance.     Dental advisory given  Plan Discussed with: CRNA  Anesthesia Plan Comments:          Anesthesia Quick Evaluation

## 2023-09-09 NOTE — Progress Notes (Signed)
 Stable overnight.  Did have small amount of coffee-ground emesis yesterday and a small bloody stool this morning hemoglobin back down to 6.7 currently in the process of getting his third unit since admission.  Vitals are stable.  I saw him in the ICU discussed the need for EGD with therapeutic intervention as feasible/appropriate per plan.  Discussed the risk benefits and limitations with the patient and his spouse at the bedside questions answered all parties agreeable  Plan for an EGD later this morning.  Further recommendations to follow.

## 2023-09-10 DIAGNOSIS — K922 Gastrointestinal hemorrhage, unspecified: Secondary | ICD-10-CM | POA: Diagnosis not present

## 2023-09-10 LAB — BPAM RBC
Blood Product Expiration Date: 202508282359
Blood Product Expiration Date: 202509012359
Blood Product Expiration Date: 202509022359
Blood Product Expiration Date: 202509092359
Blood Product Expiration Date: 202509112359
ISSUE DATE / TIME: 202508072204
ISSUE DATE / TIME: 202508080117
ISSUE DATE / TIME: 202508090737
ISSUE DATE / TIME: 202508091037
ISSUE DATE / TIME: 202508091257
Unit Type and Rh: 9500
Unit Type and Rh: 9500
Unit Type and Rh: 9500
Unit Type and Rh: 9500
Unit Type and Rh: 9500

## 2023-09-10 LAB — CBC
HCT: 27.5 % — ABNORMAL LOW (ref 39.0–52.0)
Hemoglobin: 9.1 g/dL — ABNORMAL LOW (ref 13.0–17.0)
MCH: 30.1 pg (ref 26.0–34.0)
MCHC: 33.1 g/dL (ref 30.0–36.0)
MCV: 91.1 fL (ref 80.0–100.0)
Platelets: 132 K/uL — ABNORMAL LOW (ref 150–400)
RBC: 3.02 MIL/uL — ABNORMAL LOW (ref 4.22–5.81)
RDW: 19.9 % — ABNORMAL HIGH (ref 11.5–15.5)
WBC: 3.5 K/uL — ABNORMAL LOW (ref 4.0–10.5)
nRBC: 0 % (ref 0.0–0.2)

## 2023-09-10 LAB — TYPE AND SCREEN
ABO/RH(D): O NEG
Antibody Screen: NEGATIVE
Unit division: 0
Unit division: 0
Unit division: 0
Unit division: 0
Unit division: 0

## 2023-09-10 MED ORDER — VITAMIN B-12 100 MCG PO TABS
100.0000 ug | ORAL_TABLET | Freq: Every day | ORAL | Status: DC
  Administered 2023-09-10 – 2023-09-11 (×3): 100 ug via ORAL
  Filled 2023-09-10 (×2): qty 1

## 2023-09-10 MED ORDER — VITAMIN D 25 MCG (1000 UNIT) PO TABS
1000.0000 [IU] | ORAL_TABLET | Freq: Every day | ORAL | Status: DC
  Administered 2023-09-10 – 2023-09-11 (×3): 1000 [IU] via ORAL
  Filled 2023-09-10 (×2): qty 1

## 2023-09-10 MED ORDER — METOPROLOL TARTRATE 25 MG PO TABS
25.0000 mg | ORAL_TABLET | Freq: Two times a day (BID) | ORAL | Status: DC
  Administered 2023-09-10 – 2023-09-11 (×3): 25 mg via ORAL
  Filled 2023-09-10 (×3): qty 1

## 2023-09-10 NOTE — Plan of Care (Signed)
   Problem: Education: Goal: Knowledge of General Education information will improve Description: Including pain rating scale, medication(s)/side effects and non-pharmacologic comfort measures Outcome: Progressing   Problem: Clinical Measurements: Goal: Ability to maintain clinical measurements within normal limits will improve Outcome: Progressing Goal: Will remain free from infection Outcome: Progressing

## 2023-09-10 NOTE — Progress Notes (Signed)
 Referring Provider(s): Courage Emokpae  Supervising Physician: Philip Cornet  Patient Status:  [facility] - In-pt  Chief Complaint:  GI Bleed due to ulcer - s/p embo  Brief History:  Raymond Hurst is a 73 y.o. male with atrial fibrillation on Eliquis , gout, stage IV lung cancer, and alcohol abuse.   He was admitted on 09/07/23 with Acute GI Bleed with a Hgb of 6.7    EGD done yesterday showed =       [facility]  and underwent urgent mesenteric angiography with GDA embolization yesterday by Dr. Vanice.    Subjective:  Standing up in room. Doing well. No complaints  Allergies: Patient has no known allergies.  Medications: Prior to Admission medications   Medication Sig Start Date End Date Taking? Authorizing Provider  acetaminophen  (TYLENOL ) 500 MG tablet Take 500 mg by mouth every 6 (six) hours as needed for mild pain.   Yes [provider]  allopurinol  (ZYLOPRIM ) 100 MG tablet Take 100 mg by mouth daily. 05/25/22  Yes [provider]  apixaban  (ELIQUIS ) 5 MG TABS tablet Take 1 tablet (5 mg total) by mouth 2 (two) times daily. Okay to restart this medication on 08/29/2023 08/28/23  Yes Shelah Lamar RAMAN, MD  cholecalciferol  (VITAMIN D3) 25 MCG (1000 UNIT) tablet Take 1,000 Units by mouth daily.   Yes [provider]  cyanocobalamin  (VITAMIN B12) 100 MCG tablet Take 100 mcg by mouth daily.   Yes [provider]  furosemide  (LASIX ) 40 MG tablet Take 20 mg by mouth daily.   Yes [provider]  HYDROcodone  bit-homatropine (HYCODAN) 5-1.5 MG/5ML syrup Take 5 mLs by mouth every 6 (six) hours as needed for cough. 07/03/23  Yes Meade Verdon RAMAN, MD  levalbuterol  (XOPENEX  HFA) 45 MCG/ACT inhaler Inhale 1 puff into the lungs every 4 (four) hours as needed for wheezing. 07/26/23 07/25/24 Yes Meade Verdon RAMAN, MD  loperamide (IMODIUM A-D) 2 MG tablet Take 2-4 mg by mouth 4 (four) times daily as needed for diarrhea or loose stools.   Yes  [provider]  metoprolol  tartrate (LOPRESSOR ) 25 MG tablet Take 1 tablet (25 mg total) by mouth 2 (two) times daily. 11/17/21  Yes Strader, [place] M, PA-C  ondansetron  (ZOFRAN ) 8 MG tablet Take 1 tablet (8 mg total) by mouth every 8 (eight) hours as needed (As needed for nausea starting 3 days after chemo). 07/07/23  Yes Cloretta Arley NOVAK, MD  prochlorperazine  (COMPAZINE ) 10 MG tablet Take 1 tablet (10 mg total) by mouth every 6 (six) hours as needed for nausea or vomiting. 07/07/23  Yes Cloretta Arley NOVAK, MD  simethicone  (MYLICON) 125 MG chewable tablet Chew 125 mg by mouth every 6 (six) hours as needed for flatulence.   Yes [provider]  thiamine  (VITAMIN B-1) 100 MG tablet Take 100 mg by mouth daily.   Yes [provider]     Vital Signs: BP 111/68 (BP Location: Right Arm)   Pulse 91   Temp (!) 97.4 F (36.3 C) (Oral)   Resp (!) 22   Ht 6' (1.829 m)   Wt 263 lb 0.1 oz (119.3 kg)   SpO2 93%   BMI 35.67 kg/m   Physical Exam Constitutional:      Appearance: Normal appearance.  HENT:     Head: Normocephalic and atraumatic.  Cardiovascular:     Rate and Rhythm: Normal rate.  Pulmonary:     Effort: Pulmonary effort is normal. No respiratory distress.  Abdominal:     Palpations: Abdomen is soft.     Tenderness: There is no abdominal tenderness.  Skin:    General: Skin is warm and dry.  Neurological:     General: No focal deficit present.     Mental Status: He is alert and oriented to person, place, and time.  Psychiatric:        Mood and Affect: Mood normal.        Behavior: Behavior normal.        Thought Content: Thought content normal.        Judgment: Judgment normal.   CFA access site looks good.   Labs:  CBC: Recent Labs    09/08/23 0417 09/08/23 0907 09/08/23 1549 09/08/23 2004 09/09/23 0522 09/10/23 0750  WBC 2.8*  --  3.0*  --  2.9* 3.5*  HGB 7.4*   < > 7.2* 7.3* 6.7* 9.1*  HCT 22.1*   < > 21.7* 22.0* 21.1* 27.5*  PLT 141*   --  147*  --  147* 132*   < > = values in this interval not displayed.    COAGS: Recent Labs    06/06/23 0638 06/13/23 0031 06/14/23 0422 09/07/23 2047  INR 1.2 1.1 1.3* 1.3*  APTT 32  --  32  --     BMP: Recent Labs    08/23/23 1100 09/07/23 2047 09/08/23 0417 09/09/23 0634  NA 137 139 137 139  K 3.7 3.3* 4.3 3.5  CL 103 106 108 108  CO2 23 20* 23 25  GLUCOSE 101* 177* 137* 106*  BUN 8 22 22 21   CALCIUM  9.3 8.1* 7.8* 7.9*  CREATININE 0.66 0.79 0.65 0.68  GFRNONAA >60 >60 >60 >60    LIVER FUNCTION TESTS: Recent Labs    08/08/23 0828 08/14/23 0815 08/23/23 1100 09/07/23 2047  BILITOT 0.5 0.6 0.5 0.6  AST 11* 15 13* 14*  ALT 9 9 9 8   ALKPHOS 56 55 56 42  PROT 6.2* 6.3* 6.4* 5.2*  ALBUMIN 3.3* 3.5 3.4* 2.5*    Assessment and Plan:  S/P Mesenteric angiography and GDA embolization yesterday by Dr. Vanice.  Doing well. Hgb 9.1. No signs of GI bleed. Will sign off.  Electronically Signed: SARI GORMAN LAMP, PA-C 09/10/2023, 12:23 PM    I spent a total of 15 Minutes at the the patient's bedside AND on the patient's hospital floor or unit, greater than 50% of which was counseling/coordinating care for f/u GI Bleed

## 2023-09-10 NOTE — Progress Notes (Signed)
 PROGRESS NOTE    TAYVON CULLEY  FMW:[id number] DOB: 09-28-1950 DOA: 09/07/2023 PCP: [facility]     Brief Narrative:  REMON QUINTO is a 73 y.o. male with medical history significant for atrial fibrillation on chronic anticoagulation, gout, GI bleed, stage IV lung cancer, alcohol abuse. Patient presented to the ED with complaints of black stools of 3 days duration, blood in the stool that he noticed today, and on EMS arrival, he was vomiting blood.  He reports dizziness.  GI was consulted and patient underwent EGD on 8/9.  He was urgently transferred to [facility] on 8/9 for IR embolization.   New events last 24 hours / Subjective: Had dark bowel movements today.  Remains on clear liquid diet.  No new complaints.  Assessment & Plan:   Principal Problem:   GI bleed Active Problems:   Paroxysmal atrial fibrillation (HCC)   OSA on CPAP   Gout   Alcohol abuse   Lymphedema of leg   Lung cancer, upper lobe (HCC)   Acute on chronic anemia   Upper GI bleed Acute blood loss anemia - Status post 5 units packed red blood cell transfusion - Status post EGD 8/9 which revealed deep cratered ulcer in the duodenum, broad-based visible vessel/pigmented protuberance present - Status post mesenteric angiography with embolization 8/9 - Discussed with Dr. Shaaron today.  Continue clear liquid diet today and advance diet tomorrow - PPI twice daily for 12 weeks - He can resume Eliquis  8/18 - Hemoglobin is stable today  Paroxysmal A-fib - He can resume Eliquis  8/18 - Lopressor    Lung cancer - Followed by Dr. Cloretta   Alcohol abuse - Last alcohol drink 8/8 - Continue CIWA protocol - Folic acid , B1, MV   Obesity -Estimated body mass index is 35.67 kg/m as calculated from the following:   Height as of this encounter: 6' (1.829 m).   Weight as of this encounter: 119.3 kg.   DVT prophylaxis:  SCDs Start: 09/07/23 2308  Code Status: Full code Family  Communication: Sister at bedside Disposition Plan: Home Status is: Inpatient Remains inpatient appropriate because: Remains on clear liquid diet today    Antimicrobials:  Anti-infectives (From admission, onward)    None        Objective: Vitals:   09/10/23 0200 09/10/23 0400 09/10/23 0500 09/10/23 0821  BP:  97/64  (!) 108/59  Pulse: 90 (!) 106 89 82  Resp: 20 19 14  (!) 22  Temp:  98.2 F (36.8 C)  97.9 F (36.6 C)  TempSrc:  Oral  Oral  SpO2: 96% 95% 94% 96%  Weight:      Height:        Intake/Output Summary (Last 24 hours) at 09/10/2023 1139 Last data filed at 09/10/2023 0553 Gross per 24 hour  Intake 1219.21 ml  Output 2425 ml  Net -1205.79 ml   Filed Weights   09/07/23 2034 09/07/23 2251  Weight: 120 kg 119.3 kg    Examination:  General exam: Appears calm and comfortable  Respiratory system: Clear to auscultation. Respiratory effort normal. No respiratory distress. No conversational dyspnea.  Cardiovascular system: S1 & S2 heard, Irreg rhythm. No murmurs. No pedal edema. Gastrointestinal system: Abdomen is nondistended, soft and nontender. Normal bowel sounds heard. Central nervous system: Alert and oriented. No focal neurological deficits. Speech clear.  Extremities: Symmetric in appearance  Skin: No rashes, lesions or ulcers on exposed skin  Psychiatry: Judgement and insight appear normal. Mood &  affect appropriate.   Data Reviewed: I have personally reviewed following labs and imaging studies  CBC: Recent Labs  Lab 09/07/23 2047 09/08/23 0417 09/08/23 0907 09/08/23 1549 09/08/23 2004 09/09/23 0522 09/10/23 0750  WBC 5.3 2.8*  --  3.0*  --  2.9* 3.5*  NEUTROABS 4.4  --   --   --   --   --   --   HGB 6.7* 7.4* 7.0* 7.2* 7.3* 6.7* 9.1*  HCT 21.0* 22.1* 21.2* 21.7* 22.0* 21.1* 27.5*  MCV 93.3 92.5  --  93.1  --  93.8 91.1  PLT 194 141*  --  147*  --  147* 132*   Basic Metabolic Panel: Recent Labs  Lab 09/07/23 2047 09/08/23 0417  09/09/23 0634  NA 139 137 139  K 3.3* 4.3 3.5  CL 106 108 108  CO2 20* 23 25  GLUCOSE 177* 137* 106*  BUN 22 22 21   CREATININE 0.79 0.65 0.68  CALCIUM  8.1* 7.8* 7.9*   GFR: Estimated Creatinine Clearance: 113 mL/min (by C-G formula based on SCr of 0.68 mg/dL). Liver Function Tests: Recent Labs  Lab 09/07/23 2047  AST 14*  ALT 8  ALKPHOS 42  BILITOT 0.6  PROT 5.2*  ALBUMIN 2.5*   Recent Labs  Lab 09/07/23 2047  LIPASE 28   No results for input(s): AMMONIA in the last 168 hours. Coagulation Profile: Recent Labs  Lab 09/07/23 2047  INR 1.3*   Cardiac Enzymes: No results for input(s): CKTOTAL, CKMB, CKMBINDEX, TROPONINI in the last 168 hours. BNP (last 3 results) No results for input(s): PROBNP in the last 8760 hours. HbA1C: No results for input(s): HGBA1C in the last 72 hours. CBG: Recent Labs  Lab 09/09/23 1307  GLUCAP 117*   Lipid Profile: No results for input(s): CHOL, HDL, LDLCALC, TRIG, CHOLHDL, LDLDIRECT in the last 72 hours. Thyroid  Function Tests: No results for input(s): TSH, T4TOTAL, FREET4, T3FREE, THYROIDAB in the last 72 hours. Anemia Panel: No results for input(s): VITAMINB12, FOLATE, FERRITIN, TIBC, IRON , RETICCTPCT in the last 72 hours. Sepsis Labs: Recent Labs  Lab 09/07/23 2047 09/08/23 0417  LATICACIDVEN 2.6* 0.9    Recent Results (from the past 240 hours)  MRSA Next Gen by PCR, Nasal     Status: None   Collection Time: 09/08/23  4:17 AM   Specimen: Nasal Mucosa; Nasal Swab  Result Value Ref Range Status   MRSA by PCR Next Gen NOT DETECTED NOT DETECTED Final    Comment: (NOTE) The GeneXpert MRSA Assay (FDA approved for NASAL specimens only), is one component of a comprehensive MRSA colonization surveillance program. It is not intended to diagnose MRSA infection nor to guide or monitor treatment for MRSA infections. Test performance is not FDA approved in patients less than 18  years old. Performed at [facility], [address], [place], [place] [address]       Radiology Studies: IR EMBO ART  VEN HEMORR LYMPH EXTRAV  INC GUIDE ROADMAPPING Result Date: 09/09/2023 INDICATION: Large crater acute duodenal ulcer with acute blood loss anemia requiring transfusion. EXAM: ULTRASOUND GUIDANCE FOR VASCULAR ACCESS SMA, CELIAC, AND GDA CATHETERIZATIONS AND ANGIOGRAMS GDA MICRO COIL EMBOLIZATION MEDICATIONS: 1% lidocaine  local. ANESTHESIA/SEDATION: Moderate (conscious) sedation was employed during this procedure. A total of Versed  1.0 mg and Fentanyl  50 mcg was administered intravenously by the radiology nurse. Total intra-service moderate Sedation Time: 36 minutes. The patient's level of consciousness and vital signs were monitored continuously by radiology nursing throughout the procedure under my direct supervision.  CONTRAST:  55 cc omni 300 FLUOROSCOPY: Radiation Exposure Index (as provided by the fluoroscopic device): 812 mGy Kerma COMPLICATIONS: None immediate. PROCEDURE: Informed consent was obtained from the patient following explanation of the procedure, risks, benefits and alternatives. The patient understands, agrees and consents for the procedure. All questions were addressed. A time out was performed prior to the initiation of the procedure. Maximal barrier sterile technique utilized including caps, mask, sterile gowns, sterile gloves, large sterile drape, hand hygiene, and Betadine prep. Previous imaging reviewed. Under sterile conditions and local anesthesia, ultrasound micropuncture access performed of the right common femoral artery. Images obtained for documentation of the patent right common femoral artery. 018 guidewire advanced followed by the 4 [place] dilator set. Bentson guidewire inserted followed by 5 French sheath. C2 catheter utilized to select the SMA origin. Initial SMA angiogram performed. SMA: SMA is widely patent with nonocclusive calcified atherosclerosis of  the wall. Replaced right hepatic artery noted. Catheter retracted and utilized to select the celiac origin. Celiac angiogram performed. Celiac: Celiac, hepatic, small accessory right hepatic, gastroduodenal artery, and gastroepiploic artery are all patent. No active bleeding demonstrated. Renegade STC microcatheter over a single angle GT Glidewire was advanced into the GDA. Selective GDA angiogram performed. GDA: GDA and gastroepiploic vasculature all patent. No active bleeding demonstrated. Preventative micro coil embolization of the GDA for the known large crater duodenal ulcer: Successful micro coil embolization performed with a total of 9 interlock 2D fibered coils ranging in diameter from 6-10 mm and in various lengths from 10 to 30 cm. Following embolization, there is complete coil occlusion of the GDA. Final celiac angiogram confirms preserved patency of the celiac and its branches including the splenic, left gastric, replaced left hepatic, and accessory right hepatic vasculature. Access removed. Hemostasis obtained with the CELT device successfully. IMPRESSION: Successful GDA micro coil embolization as above for the endoscopically proven large crater like acute duodenal ulcer with recent acute upper GI bleeding. Electronically Signed   By: CHRISTELLA.  Shick M.D.   On: 09/09/2023 16:53   IR Angiogram Visceral Selective Result Date: 09/09/2023 INDICATION: Large crater acute duodenal ulcer with acute blood loss anemia requiring transfusion. EXAM: ULTRASOUND GUIDANCE FOR VASCULAR ACCESS SMA, CELIAC, AND GDA CATHETERIZATIONS AND ANGIOGRAMS GDA MICRO COIL EMBOLIZATION MEDICATIONS: 1% lidocaine  local. ANESTHESIA/SEDATION: Moderate (conscious) sedation was employed during this procedure. A total of Versed  1.0 mg and Fentanyl  50 mcg was administered intravenously by the radiology nurse. Total intra-service moderate Sedation Time: 36 minutes. The patient's level of consciousness and vital signs were monitored continuously  by radiology nursing throughout the procedure under my direct supervision. CONTRAST:  55 cc omni 300 FLUOROSCOPY: Radiation Exposure Index (as provided by the fluoroscopic device): 812 mGy Kerma COMPLICATIONS: None immediate. PROCEDURE: Informed consent was obtained from the patient following explanation of the procedure, risks, benefits and alternatives. The patient understands, agrees and consents for the procedure. All questions were addressed. A time out was performed prior to the initiation of the procedure. Maximal barrier sterile technique utilized including caps, mask, sterile gowns, sterile gloves, large sterile drape, hand hygiene, and Betadine prep. Previous imaging reviewed. Under sterile conditions and local anesthesia, ultrasound micropuncture access performed of the right common femoral artery. Images obtained for documentation of the patent right common femoral artery. 018 guidewire advanced followed by the 4 [place] dilator set. Bentson guidewire inserted followed by 5 French sheath. C2 catheter utilized to select the SMA origin. Initial SMA angiogram performed. SMA: SMA is widely patent with nonocclusive calcified atherosclerosis of the  wall. Replaced right hepatic artery noted. Catheter retracted and utilized to select the celiac origin. Celiac angiogram performed. Celiac: Celiac, hepatic, small accessory right hepatic, gastroduodenal artery, and gastroepiploic artery are all patent. No active bleeding demonstrated. Renegade STC microcatheter over a single angle GT Glidewire was advanced into the GDA. Selective GDA angiogram performed. GDA: GDA and gastroepiploic vasculature all patent. No active bleeding demonstrated. Preventative micro coil embolization of the GDA for the known large crater duodenal ulcer: Successful micro coil embolization performed with a total of 9 interlock 2D fibered coils ranging in diameter from 6-10 mm and in various lengths from 10 to 30 cm. Following embolization, there  is complete coil occlusion of the GDA. Final celiac angiogram confirms preserved patency of the celiac and its branches including the splenic, left gastric, replaced left hepatic, and accessory right hepatic vasculature. Access removed. Hemostasis obtained with the CELT device successfully. IMPRESSION: Successful GDA micro coil embolization as above for the endoscopically proven large crater like acute duodenal ulcer with recent acute upper GI bleeding. Electronically Signed   By: CHRISTELLA.  Shick M.D.   On: 09/09/2023 16:53   IR US  Guide Vasc Access Right Result Date: 09/09/2023 INDICATION: Large crater acute duodenal ulcer with acute blood loss anemia requiring transfusion. EXAM: ULTRASOUND GUIDANCE FOR VASCULAR ACCESS SMA, CELIAC, AND GDA CATHETERIZATIONS AND ANGIOGRAMS GDA MICRO COIL EMBOLIZATION MEDICATIONS: 1% lidocaine  local. ANESTHESIA/SEDATION: Moderate (conscious) sedation was employed during this procedure. A total of Versed  1.0 mg and Fentanyl  50 mcg was administered intravenously by the radiology nurse. Total intra-service moderate Sedation Time: 36 minutes. The patient's level of consciousness and vital signs were monitored continuously by radiology nursing throughout the procedure under my direct supervision. CONTRAST:  55 cc omni 300 FLUOROSCOPY: Radiation Exposure Index (as provided by the fluoroscopic device): 812 mGy Kerma COMPLICATIONS: None immediate. PROCEDURE: Informed consent was obtained from the patient following explanation of the procedure, risks, benefits and alternatives. The patient understands, agrees and consents for the procedure. All questions were addressed. A time out was performed prior to the initiation of the procedure. Maximal barrier sterile technique utilized including caps, mask, sterile gowns, sterile gloves, large sterile drape, hand hygiene, and Betadine prep. Previous imaging reviewed. Under sterile conditions and local anesthesia, ultrasound micropuncture access performed  of the right common femoral artery. Images obtained for documentation of the patent right common femoral artery. 018 guidewire advanced followed by the 4 [place] dilator set. Bentson guidewire inserted followed by 5 French sheath. C2 catheter utilized to select the SMA origin. Initial SMA angiogram performed. SMA: SMA is widely patent with nonocclusive calcified atherosclerosis of the wall. Replaced right hepatic artery noted. Catheter retracted and utilized to select the celiac origin. Celiac angiogram performed. Celiac: Celiac, hepatic, small accessory right hepatic, gastroduodenal artery, and gastroepiploic artery are all patent. No active bleeding demonstrated. Renegade STC microcatheter over a single angle GT Glidewire was advanced into the GDA. Selective GDA angiogram performed. GDA: GDA and gastroepiploic vasculature all patent. No active bleeding demonstrated. Preventative micro coil embolization of the GDA for the known large crater duodenal ulcer: Successful micro coil embolization performed with a total of 9 interlock 2D fibered coils ranging in diameter from 6-10 mm and in various lengths from 10 to 30 cm. Following embolization, there is complete coil occlusion of the GDA. Final celiac angiogram confirms preserved patency of the celiac and its branches including the splenic, left gastric, replaced left hepatic, and accessory right hepatic vasculature. Access removed. Hemostasis obtained with the CELT device successfully.  IMPRESSION: Successful GDA micro coil embolization as above for the endoscopically proven large crater like acute duodenal ulcer with recent acute upper GI bleeding. Electronically Signed   By: CHRISTELLA.  Shick M.D.   On: 09/09/2023 16:53   IR Angiogram Selective Each Additional Vessel Result Date: 09/09/2023 INDICATION: Large crater acute duodenal ulcer with acute blood loss anemia requiring transfusion. EXAM: ULTRASOUND GUIDANCE FOR VASCULAR ACCESS SMA, CELIAC, AND GDA CATHETERIZATIONS AND  ANGIOGRAMS GDA MICRO COIL EMBOLIZATION MEDICATIONS: 1% lidocaine  local. ANESTHESIA/SEDATION: Moderate (conscious) sedation was employed during this procedure. A total of Versed  1.0 mg and Fentanyl  50 mcg was administered intravenously by the radiology nurse. Total intra-service moderate Sedation Time: 36 minutes. The patient's level of consciousness and vital signs were monitored continuously by radiology nursing throughout the procedure under my direct supervision. CONTRAST:  55 cc omni 300 FLUOROSCOPY: Radiation Exposure Index (as provided by the fluoroscopic device): 812 mGy Kerma COMPLICATIONS: None immediate. PROCEDURE: Informed consent was obtained from the patient following explanation of the procedure, risks, benefits and alternatives. The patient understands, agrees and consents for the procedure. All questions were addressed. A time out was performed prior to the initiation of the procedure. Maximal barrier sterile technique utilized including caps, mask, sterile gowns, sterile gloves, large sterile drape, hand hygiene, and Betadine prep. Previous imaging reviewed. Under sterile conditions and local anesthesia, ultrasound micropuncture access performed of the right common femoral artery. Images obtained for documentation of the patent right common femoral artery. 018 guidewire advanced followed by the 4 [place] dilator set. Bentson guidewire inserted followed by 5 French sheath. C2 catheter utilized to select the SMA origin. Initial SMA angiogram performed. SMA: SMA is widely patent with nonocclusive calcified atherosclerosis of the wall. Replaced right hepatic artery noted. Catheter retracted and utilized to select the celiac origin. Celiac angiogram performed. Celiac: Celiac, hepatic, small accessory right hepatic, gastroduodenal artery, and gastroepiploic artery are all patent. No active bleeding demonstrated. Renegade STC microcatheter over a single angle GT Glidewire was advanced into the GDA.  Selective GDA angiogram performed. GDA: GDA and gastroepiploic vasculature all patent. No active bleeding demonstrated. Preventative micro coil embolization of the GDA for the known large crater duodenal ulcer: Successful micro coil embolization performed with a total of 9 interlock 2D fibered coils ranging in diameter from 6-10 mm and in various lengths from 10 to 30 cm. Following embolization, there is complete coil occlusion of the GDA. Final celiac angiogram confirms preserved patency of the celiac and its branches including the splenic, left gastric, replaced left hepatic, and accessory right hepatic vasculature. Access removed. Hemostasis obtained with the CELT device successfully. IMPRESSION: Successful GDA micro coil embolization as above for the endoscopically proven large crater like acute duodenal ulcer with recent acute upper GI bleeding. Electronically Signed   By: CHRISTELLA.  Shick M.D.   On: 09/09/2023 16:53   IR Angiogram Visceral Selective Result Date: 09/09/2023 INDICATION: Large crater acute duodenal ulcer with acute blood loss anemia requiring transfusion. EXAM: ULTRASOUND GUIDANCE FOR VASCULAR ACCESS SMA, CELIAC, AND GDA CATHETERIZATIONS AND ANGIOGRAMS GDA MICRO COIL EMBOLIZATION MEDICATIONS: 1% lidocaine  local. ANESTHESIA/SEDATION: Moderate (conscious) sedation was employed during this procedure. A total of Versed  1.0 mg and Fentanyl  50 mcg was administered intravenously by the radiology nurse. Total intra-service moderate Sedation Time: 36 minutes. The patient's level of consciousness and vital signs were monitored continuously by radiology nursing throughout the procedure under my direct supervision. CONTRAST:  55 cc omni 300 FLUOROSCOPY: Radiation Exposure Index (as provided by the fluoroscopic device): 812 mGy  Kerma COMPLICATIONS: None immediate. PROCEDURE: Informed consent was obtained from the patient following explanation of the procedure, risks, benefits and alternatives. The patient  understands, agrees and consents for the procedure. All questions were addressed. A time out was performed prior to the initiation of the procedure. Maximal barrier sterile technique utilized including caps, mask, sterile gowns, sterile gloves, large sterile drape, hand hygiene, and Betadine prep. Previous imaging reviewed. Under sterile conditions and local anesthesia, ultrasound micropuncture access performed of the right common femoral artery. Images obtained for documentation of the patent right common femoral artery. 018 guidewire advanced followed by the 4 [place] dilator set. Bentson guidewire inserted followed by 5 French sheath. C2 catheter utilized to select the SMA origin. Initial SMA angiogram performed. SMA: SMA is widely patent with nonocclusive calcified atherosclerosis of the wall. Replaced right hepatic artery noted. Catheter retracted and utilized to select the celiac origin. Celiac angiogram performed. Celiac: Celiac, hepatic, small accessory right hepatic, gastroduodenal artery, and gastroepiploic artery are all patent. No active bleeding demonstrated. Renegade STC microcatheter over a single angle GT Glidewire was advanced into the GDA. Selective GDA angiogram performed. GDA: GDA and gastroepiploic vasculature all patent. No active bleeding demonstrated. Preventative micro coil embolization of the GDA for the known large crater duodenal ulcer: Successful micro coil embolization performed with a total of 9 interlock 2D fibered coils ranging in diameter from 6-10 mm and in various lengths from 10 to 30 cm. Following embolization, there is complete coil occlusion of the GDA. Final celiac angiogram confirms preserved patency of the celiac and its branches including the splenic, left gastric, replaced left hepatic, and accessory right hepatic vasculature. Access removed. Hemostasis obtained with the CELT device successfully. IMPRESSION: Successful GDA micro coil embolization as above for the  endoscopically proven large crater like acute duodenal ulcer with recent acute upper GI bleeding. Electronically Signed   By: CHRISTELLA.  Shick M.D.   On: 09/09/2023 16:53      Scheduled Meds:  Chlorhexidine  Gluconate Cloth  6 each Topical Daily   cholecalciferol   1,000 Units Oral Daily   folic acid   1 mg Oral Daily   metoprolol  tartrate  25 mg Oral BID   multivitamin with minerals  1 tablet Oral Daily   pantoprazole  (PROTONIX ) IV  40 mg Intravenous Q8H   Followed by   NOREEN ON 09/12/2023] pantoprazole  (PROTONIX ) IV  40 mg Intravenous Q12H   thiamine   100 mg Oral Daily   Or   thiamine   100 mg Intravenous Daily   cyanocobalamin   100 mcg Oral Daily   Continuous Infusions:   LOS: 3 days   Time spent: 40 minutes   Delon Hoe, DO [facility] 09/10/2023, 11:39 AM   Available via Epic secure chat 7am-7pm After these hours, please refer to coverage provider listed on [email or web address]

## 2023-09-10 NOTE — Anesthesia Postprocedure Evaluation (Signed)
 Anesthesia Post Note  Patient: Raymond Hurst  Procedure(s) Performed: EGD (ESOPHAGOGASTRODUODENOSCOPY)  Patient location during evaluation: PACU Anesthesia Type: MAC Level of consciousness: awake and alert Pain management: pain level controlled Vital Signs Assessment: post-procedure vital signs reviewed and stable Respiratory status: spontaneous breathing, nonlabored ventilation, respiratory function stable and patient connected to nasal cannula oxygen Cardiovascular status: stable and blood pressure returned to baseline Postop Assessment: no apparent nausea or vomiting Anesthetic complications: no   No notable events documented.   Last Vitals:  Vitals:   09/10/23 0500 09/10/23 0821  BP:  (!) 108/59  Pulse: 89 82  Resp: 14 (!) 22  Temp:  36.6 C  SpO2: 94% 96%    Last Pain:  Vitals:   09/10/23 0821  TempSrc: Oral  PainSc:                  Andrea Limes

## 2023-09-10 NOTE — Plan of Care (Signed)

## 2023-09-10 NOTE — Progress Notes (Signed)
   09/10/23 1958  BiPAP/CPAP/SIPAP  Reason BIPAP/CPAP not in use Non-compliant (Pt refuses CPAP therapy. He states he only wants to wear O2 tonight.)   RT available with CPAP if patient changes mind.

## 2023-09-11 ENCOUNTER — Inpatient Hospital Stay

## 2023-09-11 ENCOUNTER — Telehealth: Payer: Self-pay | Admitting: *Deleted

## 2023-09-11 ENCOUNTER — Ambulatory Visit: Admitting: Nurse Practitioner

## 2023-09-11 ENCOUNTER — Other Ambulatory Visit (HOSPITAL_COMMUNITY): Payer: Self-pay

## 2023-09-11 DIAGNOSIS — K922 Gastrointestinal hemorrhage, unspecified: Secondary | ICD-10-CM | POA: Diagnosis not present

## 2023-09-11 DIAGNOSIS — C3412 Malignant neoplasm of upper lobe, left bronchus or lung: Secondary | ICD-10-CM

## 2023-09-11 LAB — CBC
HCT: 28.3 % — ABNORMAL LOW (ref 39.0–52.0)
Hemoglobin: 9.5 g/dL — ABNORMAL LOW (ref 13.0–17.0)
MCH: 30.4 pg (ref 26.0–34.0)
MCHC: 33.6 g/dL (ref 30.0–36.0)
MCV: 90.4 fL (ref 80.0–100.0)
Platelets: 152 K/uL (ref 150–400)
RBC: 3.13 MIL/uL — ABNORMAL LOW (ref 4.22–5.81)
RDW: 20.1 % — ABNORMAL HIGH (ref 11.5–15.5)
WBC: 3.6 K/uL — ABNORMAL LOW (ref 4.0–10.5)
nRBC: 0 % (ref 0.0–0.2)

## 2023-09-11 MED ORDER — ALLOPURINOL 100 MG PO TABS: 100.0000 mg | ORAL_TABLET | Freq: Every day | ORAL | Status: DC

## 2023-09-11 MED ORDER — PANTOPRAZOLE SODIUM 40 MG PO TBEC
40.0000 mg | DELAYED_RELEASE_TABLET | Freq: Two times a day (BID) | ORAL | 0 refills | Status: DC
  Filled 2023-09-11: qty 180, 90d supply, fill #0

## 2023-09-11 MED ORDER — PANTOPRAZOLE SODIUM 40 MG IV SOLR: 40.0000 mg | Freq: Two times a day (BID) | INTRAVENOUS | Status: DC

## 2023-09-11 MED ORDER — FOLIC ACID 1 MG PO TABS
1.0000 mg | ORAL_TABLET | Freq: Every day | ORAL | 0 refills | Status: DC
  Filled 2023-09-11: qty 90, 90d supply, fill #0

## 2023-09-11 NOTE — Discharge Summary (Signed)
 Physician Discharge Summary  Raymond Hurst FMW:[id number] DOB: 05-10-1950 DOA: 09/07/2023  PCP: [facility]  Admit date: 09/07/2023 Discharge date: 09/11/2023  Admitted From: Home Disposition:  Home  Recommendations for Outpatient Follow-up:  Follow up with PCP in 1 week Follow up with GI Repeat CBC in 1 week to check stability of Hgb  Resume eliquis  8/18   Discharge Condition: Stable CODE STATUS: Full  Diet recommendation: Heart healthy   Brief/Interim Summary: Raymond Hurst is a 73 y.o. male with medical history significant for atrial fibrillation on chronic anticoagulation, gout, GI bleed, stage IV lung cancer, alcohol abuse. Patient presented to the ED with complaints of black stools of 3 days duration, blood in the stool that he noticed today, and on EMS arrival, he was vomiting blood.  He reports dizziness.  GI was consulted and patient underwent EGD on 8/9.  He was urgently transferred to [facility] on 8/9 for IR embolization. He is status post mesenteric angiography with embolization 8/9. Hgb remained stable. Diet advanced and patient discharged in stable condition.  Discharge Diagnoses:   Principal Problem:   GI bleed Active Problems:   Paroxysmal atrial fibrillation (HCC)   OSA on CPAP   Gout   Alcohol abuse   Lymphedema of leg   Lung cancer, upper lobe (HCC)   Acute on chronic anemia   Upper GI bleed Acute blood loss anemia - Status post 5 units packed red blood cell transfusion - Status post EGD 8/9 which revealed deep cratered ulcer in the duodenum, broad-based visible vessel/pigmented protuberance present - Status post mesenteric angiography with embolization 8/9 - Discussed with Dr. Shaaron - PPI twice daily for 12 weeks - He can resume Eliquis  8/18 - Hemoglobin is stable today 9.5    Paroxysmal A-fib - He can resume Eliquis  8/18 - Lopressor     Lung cancer - Followed by Dr. Cloretta    Alcohol abuse - Last alcohol drink  8/8 - Folic acid , B1, MV    Obesity -Estimated body mass index is 35.67 kg/m as calculated from the following:   Height as of this encounter: 6' (1.829 m).   Weight as of this encounter: 119.3 kg.  Discharge Instructions  Discharge Instructions     Call MD for:  difficulty breathing, headache or visual disturbances   Complete by: As directed    Call MD for:  extreme fatigue   Complete by: As directed    Call MD for:  persistant dizziness or light-headedness   Complete by: As directed    Call MD for:  persistant nausea and vomiting   Complete by: As directed    Call MD for:  severe uncontrolled pain   Complete by: As directed    Call MD for:  temperature >100.4   Complete by: As directed    Diet - low sodium heart healthy   Complete by: As directed    Discharge instructions   Complete by: As directed    You were cared for by a hospitalist during your hospital stay. If you have any questions about your discharge medications or the care you received while you were in the hospital after you are discharged, you can call the unit and ask to speak with the hospitalist on call if the hospitalist that took care of you is not available. Once you are discharged, your primary care physician will handle any further medical issues. Please note that NO REFILLS for any discharge medications will be authorized  once you are discharged, as it is imperative that you return to your primary care physician (or establish a relationship with a primary care physician if you do not have one) for your aftercare needs so that they can reassess your need for medications and monitor your lab values.   Discharge instructions   Complete by: As directed    DO NOT TAKE ANY OVER THE COUNTER IBUPROFEN, NSAID.   YOU MAY TAKE TYLENOL  OVER THE COUNTER FOR MINOR ACHES AND PAINS.   Increase activity slowly   Complete by: As directed       Allergies as of 09/11/2023   No Known Allergies      Medication List      PAUSE taking these medications    apixaban  5 MG Tabs tablet Wait to take this until: September 18, 2023 Morning Commonly known as: ELIQUIS  Take 1 tablet (5 mg total) by mouth 2 (two) times daily. Okay to restart this medication on 08/29/2023       TAKE these medications    acetaminophen  500 MG tablet Commonly known as: TYLENOL  Take 500 mg by mouth every 6 (six) hours as needed for mild pain.   allopurinol  100 MG tablet Commonly known as: ZYLOPRIM  Take 100 mg by mouth daily.   cholecalciferol  25 MCG (1000 UNIT) tablet Commonly known as: VITAMIN D3 Take 1,000 Units by mouth daily.   cyanocobalamin  100 MCG tablet Commonly known as: VITAMIN B12 Take 100 mcg by mouth daily.   folic acid  1 MG tablet Commonly known as: FOLVITE  Take 1 tablet (1 mg total) by mouth daily. Start taking on: September 12, 2023   furosemide  40 MG tablet Commonly known as: LASIX  Take 20 mg by mouth daily.   HYDROcodone  bit-homatropine 5-1.5 MG/5ML syrup Commonly known as: HYCODAN Take 5 mLs by mouth every 6 (six) hours as needed for cough.   levalbuterol  45 MCG/ACT inhaler Commonly known as: XOPENEX  HFA Inhale 1 puff into the lungs every 4 (four) hours as needed for wheezing.   loperamide 2 MG tablet Commonly known as: IMODIUM A-D Take 2-4 mg by mouth 4 (four) times daily as needed for diarrhea or loose stools.   metoprolol  tartrate 25 MG tablet Commonly known as: LOPRESSOR  Take 1 tablet (25 mg total) by mouth 2 (two) times daily.   ondansetron  8 MG tablet Commonly known as: ZOFRAN  Take 1 tablet (8 mg total) by mouth every 8 (eight) hours as needed (As needed for nausea starting 3 days after chemo).   pantoprazole  40 MG tablet Commonly known as: Protonix  Take 1 tablet (40 mg total) by mouth 2 (two) times daily before a meal.   prochlorperazine  10 MG tablet Commonly known as: COMPAZINE  Take 1 tablet (10 mg total) by mouth every 6 (six) hours as needed for nausea or vomiting.    simethicone  125 MG chewable tablet Commonly known as: MYLICON Chew 125 mg by mouth every 6 (six) hours as needed for flatulence.   thiamine  100 MG tablet Commonly known as: Vitamin B-1 Take 100 mg by mouth daily.        Follow-up Information     [facility] Follow up.   Contact information: [address] [place] [address] [phone]         Shaaron Lamar HERO, MD Follow up.   Specialty: Gastroenterology Contact information: [address] [place] [address] [phone]                No  Known Allergies   Procedures/Studies: IR EMBO ART  VEN HEMORR LYMPH EXTRAV  INC GUIDE ROADMAPPING Result Date: 09/09/2023 INDICATION: Large crater acute duodenal ulcer with acute blood loss anemia requiring transfusion. EXAM: ULTRASOUND GUIDANCE FOR VASCULAR ACCESS SMA, CELIAC, AND GDA CATHETERIZATIONS AND ANGIOGRAMS GDA MICRO COIL EMBOLIZATION MEDICATIONS: 1% lidocaine  local. ANESTHESIA/SEDATION: Moderate (conscious) sedation was employed during this procedure. A total of Versed  1.0 mg and Fentanyl  50 mcg was administered intravenously by the radiology nurse. Total intra-service moderate Sedation Time: 36 minutes. The patient's level of consciousness and vital signs were monitored continuously by radiology nursing throughout the procedure under my direct supervision. CONTRAST:  55 cc omni 300 FLUOROSCOPY: Radiation Exposure Index (as provided by the fluoroscopic device): 812 mGy Kerma COMPLICATIONS: None immediate. PROCEDURE: Informed consent was obtained from the patient following explanation of the procedure, risks, benefits and alternatives. The patient understands, agrees and consents for the procedure. All questions were addressed. A time out was performed prior to the initiation of the procedure. Maximal barrier sterile technique utilized including caps, mask, sterile gowns, sterile gloves, large sterile drape, hand hygiene, and Betadine prep.  Previous imaging reviewed. Under sterile conditions and local anesthesia, ultrasound micropuncture access performed of the right common femoral artery. Images obtained for documentation of the patent right common femoral artery. 018 guidewire advanced followed by the 4 [place] dilator set. Bentson guidewire inserted followed by 5 French sheath. C2 catheter utilized to select the SMA origin. Initial SMA angiogram performed. SMA: SMA is widely patent with nonocclusive calcified atherosclerosis of the wall. Replaced right hepatic artery noted. Catheter retracted and utilized to select the celiac origin. Celiac angiogram performed. Celiac: Celiac, hepatic, small accessory right hepatic, gastroduodenal artery, and gastroepiploic artery are all patent. No active bleeding demonstrated. Renegade STC microcatheter over a single angle GT Glidewire was advanced into the GDA. Selective GDA angiogram performed. GDA: GDA and gastroepiploic vasculature all patent. No active bleeding demonstrated. Preventative micro coil embolization of the GDA for the known large crater duodenal ulcer: Successful micro coil embolization performed with a total of 9 interlock 2D fibered coils ranging in diameter from 6-10 mm and in various lengths from 10 to 30 cm. Following embolization, there is complete coil occlusion of the GDA. Final celiac angiogram confirms preserved patency of the celiac and its branches including the splenic, left gastric, replaced left hepatic, and accessory right hepatic vasculature. Access removed. Hemostasis obtained with the CELT device successfully. IMPRESSION: Successful GDA micro coil embolization as above for the endoscopically proven large crater like acute duodenal ulcer with recent acute upper GI bleeding. Electronically Signed   By: CHRISTELLA.  Shick M.D.   On: 09/09/2023 16:53   IR Angiogram Visceral Selective Result Date: 09/09/2023 INDICATION: Large crater acute duodenal ulcer with acute blood loss anemia requiring  transfusion. EXAM: ULTRASOUND GUIDANCE FOR VASCULAR ACCESS SMA, CELIAC, AND GDA CATHETERIZATIONS AND ANGIOGRAMS GDA MICRO COIL EMBOLIZATION MEDICATIONS: 1% lidocaine  local. ANESTHESIA/SEDATION: Moderate (conscious) sedation was employed during this procedure. A total of Versed  1.0 mg and Fentanyl  50 mcg was administered intravenously by the radiology nurse. Total intra-service moderate Sedation Time: 36 minutes. The patient's level of consciousness and vital signs were monitored continuously by radiology nursing throughout the procedure under my direct supervision. CONTRAST:  55 cc omni 300 FLUOROSCOPY: Radiation Exposure Index (as provided by the fluoroscopic device): 812 mGy Kerma COMPLICATIONS: None immediate. PROCEDURE: Informed consent was obtained from the patient following explanation of the procedure, risks, benefits and alternatives. The patient understands, agrees and consents for the  procedure. All questions were addressed. A time out was performed prior to the initiation of the procedure. Maximal barrier sterile technique utilized including caps, mask, sterile gowns, sterile gloves, large sterile drape, hand hygiene, and Betadine prep. Previous imaging reviewed. Under sterile conditions and local anesthesia, ultrasound micropuncture access performed of the right common femoral artery. Images obtained for documentation of the patent right common femoral artery. 018 guidewire advanced followed by the 4 [place] dilator set. Bentson guidewire inserted followed by 5 French sheath. C2 catheter utilized to select the SMA origin. Initial SMA angiogram performed. SMA: SMA is widely patent with nonocclusive calcified atherosclerosis of the wall. Replaced right hepatic artery noted. Catheter retracted and utilized to select the celiac origin. Celiac angiogram performed. Celiac: Celiac, hepatic, small accessory right hepatic, gastroduodenal artery, and gastroepiploic artery are all patent. No active bleeding  demonstrated. Renegade STC microcatheter over a single angle GT Glidewire was advanced into the GDA. Selective GDA angiogram performed. GDA: GDA and gastroepiploic vasculature all patent. No active bleeding demonstrated. Preventative micro coil embolization of the GDA for the known large crater duodenal ulcer: Successful micro coil embolization performed with a total of 9 interlock 2D fibered coils ranging in diameter from 6-10 mm and in various lengths from 10 to 30 cm. Following embolization, there is complete coil occlusion of the GDA. Final celiac angiogram confirms preserved patency of the celiac and its branches including the splenic, left gastric, replaced left hepatic, and accessory right hepatic vasculature. Access removed. Hemostasis obtained with the CELT device successfully. IMPRESSION: Successful GDA micro coil embolization as above for the endoscopically proven large crater like acute duodenal ulcer with recent acute upper GI bleeding. Electronically Signed   By: CHRISTELLA.  Shick M.D.   On: 09/09/2023 16:53   IR US  Guide Vasc Access Right Result Date: 09/09/2023 INDICATION: Large crater acute duodenal ulcer with acute blood loss anemia requiring transfusion. EXAM: ULTRASOUND GUIDANCE FOR VASCULAR ACCESS SMA, CELIAC, AND GDA CATHETERIZATIONS AND ANGIOGRAMS GDA MICRO COIL EMBOLIZATION MEDICATIONS: 1% lidocaine  local. ANESTHESIA/SEDATION: Moderate (conscious) sedation was employed during this procedure. A total of Versed  1.0 mg and Fentanyl  50 mcg was administered intravenously by the radiology nurse. Total intra-service moderate Sedation Time: 36 minutes. The patient's level of consciousness and vital signs were monitored continuously by radiology nursing throughout the procedure under my direct supervision. CONTRAST:  55 cc omni 300 FLUOROSCOPY: Radiation Exposure Index (as provided by the fluoroscopic device): 812 mGy Kerma COMPLICATIONS: None immediate. PROCEDURE: Informed consent was obtained from the  patient following explanation of the procedure, risks, benefits and alternatives. The patient understands, agrees and consents for the procedure. All questions were addressed. A time out was performed prior to the initiation of the procedure. Maximal barrier sterile technique utilized including caps, mask, sterile gowns, sterile gloves, large sterile drape, hand hygiene, and Betadine prep. Previous imaging reviewed. Under sterile conditions and local anesthesia, ultrasound micropuncture access performed of the right common femoral artery. Images obtained for documentation of the patent right common femoral artery. 018 guidewire advanced followed by the 4 [place] dilator set. Bentson guidewire inserted followed by 5 French sheath. C2 catheter utilized to select the SMA origin. Initial SMA angiogram performed. SMA: SMA is widely patent with nonocclusive calcified atherosclerosis of the wall. Replaced right hepatic artery noted. Catheter retracted and utilized to select the celiac origin. Celiac angiogram performed. Celiac: Celiac, hepatic, small accessory right hepatic, gastroduodenal artery, and gastroepiploic artery are all patent. No active bleeding demonstrated. Renegade STC microcatheter over a single angle GT  Glidewire was advanced into the GDA. Selective GDA angiogram performed. GDA: GDA and gastroepiploic vasculature all patent. No active bleeding demonstrated. Preventative micro coil embolization of the GDA for the known large crater duodenal ulcer: Successful micro coil embolization performed with a total of 9 interlock 2D fibered coils ranging in diameter from 6-10 mm and in various lengths from 10 to 30 cm. Following embolization, there is complete coil occlusion of the GDA. Final celiac angiogram confirms preserved patency of the celiac and its branches including the splenic, left gastric, replaced left hepatic, and accessory right hepatic vasculature. Access removed. Hemostasis obtained with the CELT  device successfully. IMPRESSION: Successful GDA micro coil embolization as above for the endoscopically proven large crater like acute duodenal ulcer with recent acute upper GI bleeding. Electronically Signed   By: CHRISTELLA.  Shick M.D.   On: 09/09/2023 16:53   IR Angiogram Selective Each Additional Vessel Result Date: 09/09/2023 INDICATION: Large crater acute duodenal ulcer with acute blood loss anemia requiring transfusion. EXAM: ULTRASOUND GUIDANCE FOR VASCULAR ACCESS SMA, CELIAC, AND GDA CATHETERIZATIONS AND ANGIOGRAMS GDA MICRO COIL EMBOLIZATION MEDICATIONS: 1% lidocaine  local. ANESTHESIA/SEDATION: Moderate (conscious) sedation was employed during this procedure. A total of Versed  1.0 mg and Fentanyl  50 mcg was administered intravenously by the radiology nurse. Total intra-service moderate Sedation Time: 36 minutes. The patient's level of consciousness and vital signs were monitored continuously by radiology nursing throughout the procedure under my direct supervision. CONTRAST:  55 cc omni 300 FLUOROSCOPY: Radiation Exposure Index (as provided by the fluoroscopic device): 812 mGy Kerma COMPLICATIONS: None immediate. PROCEDURE: Informed consent was obtained from the patient following explanation of the procedure, risks, benefits and alternatives. The patient understands, agrees and consents for the procedure. All questions were addressed. A time out was performed prior to the initiation of the procedure. Maximal barrier sterile technique utilized including caps, mask, sterile gowns, sterile gloves, large sterile drape, hand hygiene, and Betadine prep. Previous imaging reviewed. Under sterile conditions and local anesthesia, ultrasound micropuncture access performed of the right common femoral artery. Images obtained for documentation of the patent right common femoral artery. 018 guidewire advanced followed by the 4 [place] dilator set. Bentson guidewire inserted followed by 5 French sheath. C2 catheter utilized to  select the SMA origin. Initial SMA angiogram performed. SMA: SMA is widely patent with nonocclusive calcified atherosclerosis of the wall. Replaced right hepatic artery noted. Catheter retracted and utilized to select the celiac origin. Celiac angiogram performed. Celiac: Celiac, hepatic, small accessory right hepatic, gastroduodenal artery, and gastroepiploic artery are all patent. No active bleeding demonstrated. Renegade STC microcatheter over a single angle GT Glidewire was advanced into the GDA. Selective GDA angiogram performed. GDA: GDA and gastroepiploic vasculature all patent. No active bleeding demonstrated. Preventative micro coil embolization of the GDA for the known large crater duodenal ulcer: Successful micro coil embolization performed with a total of 9 interlock 2D fibered coils ranging in diameter from 6-10 mm and in various lengths from 10 to 30 cm. Following embolization, there is complete coil occlusion of the GDA. Final celiac angiogram confirms preserved patency of the celiac and its branches including the splenic, left gastric, replaced left hepatic, and accessory right hepatic vasculature. Access removed. Hemostasis obtained with the CELT device successfully. IMPRESSION: Successful GDA micro coil embolization as above for the endoscopically proven large crater like acute duodenal ulcer with recent acute upper GI bleeding. Electronically Signed   By: CHRISTELLA.  Shick M.D.   On: 09/09/2023 16:53   IR Angiogram Visceral Selective Result  Date: 09/09/2023 INDICATION: Large crater acute duodenal ulcer with acute blood loss anemia requiring transfusion. EXAM: ULTRASOUND GUIDANCE FOR VASCULAR ACCESS SMA, CELIAC, AND GDA CATHETERIZATIONS AND ANGIOGRAMS GDA MICRO COIL EMBOLIZATION MEDICATIONS: 1% lidocaine  local. ANESTHESIA/SEDATION: Moderate (conscious) sedation was employed during this procedure. A total of Versed  1.0 mg and Fentanyl  50 mcg was administered intravenously by the radiology nurse. Total  intra-service moderate Sedation Time: 36 minutes. The patient's level of consciousness and vital signs were monitored continuously by radiology nursing throughout the procedure under my direct supervision. CONTRAST:  55 cc omni 300 FLUOROSCOPY: Radiation Exposure Index (as provided by the fluoroscopic device): 812 mGy Kerma COMPLICATIONS: None immediate. PROCEDURE: Informed consent was obtained from the patient following explanation of the procedure, risks, benefits and alternatives. The patient understands, agrees and consents for the procedure. All questions were addressed. A time out was performed prior to the initiation of the procedure. Maximal barrier sterile technique utilized including caps, mask, sterile gowns, sterile gloves, large sterile drape, hand hygiene, and Betadine prep. Previous imaging reviewed. Under sterile conditions and local anesthesia, ultrasound micropuncture access performed of the right common femoral artery. Images obtained for documentation of the patent right common femoral artery. 018 guidewire advanced followed by the 4 [place] dilator set. Bentson guidewire inserted followed by 5 French sheath. C2 catheter utilized to select the SMA origin. Initial SMA angiogram performed. SMA: SMA is widely patent with nonocclusive calcified atherosclerosis of the wall. Replaced right hepatic artery noted. Catheter retracted and utilized to select the celiac origin. Celiac angiogram performed. Celiac: Celiac, hepatic, small accessory right hepatic, gastroduodenal artery, and gastroepiploic artery are all patent. No active bleeding demonstrated. Renegade STC microcatheter over a single angle GT Glidewire was advanced into the GDA. Selective GDA angiogram performed. GDA: GDA and gastroepiploic vasculature all patent. No active bleeding demonstrated. Preventative micro coil embolization of the GDA for the known large crater duodenal ulcer: Successful micro coil embolization performed with a total of 9  interlock 2D fibered coils ranging in diameter from 6-10 mm and in various lengths from 10 to 30 cm. Following embolization, there is complete coil occlusion of the GDA. Final celiac angiogram confirms preserved patency of the celiac and its branches including the splenic, left gastric, replaced left hepatic, and accessory right hepatic vasculature. Access removed. Hemostasis obtained with the CELT device successfully. IMPRESSION: Successful GDA micro coil embolization as above for the endoscopically proven large crater like acute duodenal ulcer with recent acute upper GI bleeding. Electronically Signed   By: CHRISTELLA.  Shick M.D.   On: 09/09/2023 16:53   DG Chest Port 1 View Result Date: 09/07/2023 CLINICAL DATA:  Vomiting blood EXAM: PORTABLE CHEST 1 VIEW COMPARISON:  08/29/2023 FINDINGS: Stable cardiomegaly. Similar partially loculated small left pleural effusion. Probable trace right pleural effusion. No pneumothorax. Diffuse interstitial coarsening. Left basilar airspace opacities. IMPRESSION: 1. Cardiomegaly with interstitial pulmonary edema. 2. Similar partially loculated small left pleural effusion. Probable trace right pleural effusion. 3. Left basilar airspace opacities may be due to atelectasis or infection. Electronically Signed   By: Norman Gatlin M.D.   On: 09/07/2023 20:57   CT SUPER D CHEST WO CONTRAST Result Date: 09/02/2023 CLINICAL DATA:  Left lung cancer post chemotherapy and radiation. Patient denies surgery. EXAM: CT CHEST WITHOUT CONTRAST TECHNIQUE: Multidetector CT imaging of the chest was performed using thin slice collimation for electromagnetic bronchoscopy planning purposes, without intravenous contrast. RADIATION DOSE REDUCTION: This exam was performed according to the departmental dose-optimization program which includes automated exposure control, adjustment  of the mA and/or kV according to patient size and/or use of iterative reconstruction technique. COMPARISON:  PET-CT 06/20/2023 and  chest CT 06/13/2023 as well as 06/05/2023 FINDINGS: Cardiovascular: Heart is normal in size. Left main and 3 vessel coronary artery disease. Thoracic aorta is normal in caliber. There is calcified plaque over the descending thoracic aorta. Remaining mediastinal structures catch that remaining vascular structures are unremarkable. Mediastinum/Nodes: No mediastinal adenopathy. No right hilar adenopathy. Patient has known large left lung mass is confluent with the left hilum. Lungs/Pleura: Lungs are adequately inflated. Continued evidence of patient's known left upper lobe lung cancer extending to the hilum as there is been interval decrease in size as this measures a proximally 4.7 x 8.0 cm (previously 6.6 x 10.6 cm). There is continued evidence of a moderate size left pleural effusion with less fluid over the left upper lobe/apex. Scarring over the lingula. Stable 3 mm nodule over the right lower lobe. Airways are normal. Upper Abdomen: Stable 6.4 cm right adrenal myelolipoma. Diverticulosis of the colon. Calcified plaque over the abdominal aorta which is normal in caliber. Right renal cyst over the upper pole measuring 4.8 cm. No acute findings. Musculoskeletal: Stable chronic L1 compression fracture. IMPRESSION: 1. evidence of patient's known left upper lobe lung cancer extending to the hilum as there has been interval decrease in size as this measures 4.7 x 8.0 cm (previously 6.6 x 10.6 cm). 2. Stable moderate size left pleural effusion with less fluid over the left upper lobe/apex. 3. Stable 3 mm nodule over the right lower lobe. Recommend attention on follow-up. 4. Stable 6.4 cm right adrenal myelolipoma. 5. Diverticulosis of the colon. 6. Stable chronic L1 compression fracture. 7. Aortic atherosclerosis. Atherosclerotic coronary artery disease. Aortic Atherosclerosis (ICD10-I70.0). Electronically Signed   By: Toribio Agreste M.D.   On: 09/02/2023 15:01   Portable chest 1 View Result Date: 08/29/2023 CLINICAL  DATA:  Pneumothorax. EXAM: PORTABLE CHEST 1 VIEW COMPARISON:  Chest radiograph dated 08/28/2023. FINDINGS: Small left pleural effusion. Left suprahilar opacity as seen on the prior radiograph and CT. No pneumothorax. Stable cardiomegaly. No acute osseous pathology. IMPRESSION: No interval change.  No pneumothorax. Electronically Signed   By: Vanetta Chou M.D.   On: 08/29/2023 12:12   DG Chest 1 View Result Date: 08/28/2023 CLINICAL DATA:  Status post bronchoscopy EXAM: CHEST  1 VIEW COMPARISON:  Chest x-ray 08/28/2023.  CT chest 08/26/2023. FINDINGS: Focal opacity in the left upper lobe appears unchanged. There is loculated fluid in the left lung apex. No definitive pneumothorax visualized. The right lung is clear. The cardiomediastinal silhouette is stable, the heart appears enlarged. The osseous structures are unchanged. IMPRESSION: 1. No definitive pneumothorax visualized. 2. Focal opacity in the left upper lobe appears unchanged. There is loculated fluid in the left lung apex. Electronically Signed   By: Greig Pique M.D.   On: 08/28/2023 16:43   DG Chest Port 1 View Result Date: 08/28/2023 CLINICAL DATA:  [id number] S/P bronchoscopy with biopsy [id number] EXAM: PORTABLE CHEST - 1 VIEW COMPARISON:  Jun 13, 2023, August 26, 2023 FINDINGS: Triangular opacity in the left upper lung zone with superior elevation of the left oblique fissure. Likely pleural line in the left lung apex. Blunting of the left costophrenic sulcus, likely representing a small pleural effusion. Mild cardiomegaly. Aortic atherosclerosis. Fullness of the left AP window, corresponding to the left upper lobe mass. Multilevel thoracic osteophytosis. IMPRESSION: 1. Likely pleural line in the left lung apex, worrisome for a small, loculated pneumothorax. Triangular  opacity in the left upper lobe, likely reflecting partial left upper lobe atelectasis/collapse. 2. Small left pleural effusion blunting the left costophrenic sulcus. Critical  Value/emergent results were discussed by telephone at the time of interpretation on 08/28/2023 at 3:54 pm to provider LAMAR CHRIS, MD , who verbally acknowledged these results. Electronically Signed   By: Rogelia Myers M.D.   On: 08/28/2023 15:59   DG C-ARM BRONCHOSCOPY Result Date: 08/28/2023 C-ARM BRONCHOSCOPY: Fluoroscopy was utilized by the requesting physician.  No radiographic interpretation.      Discharge Exam: Vitals:   09/11/23 0800 09/11/23 1215  BP: 102/88 96/68  Pulse: 92 86  Resp:    Temp: 97.8 F (36.6 C) 98.3 F (36.8 C)  SpO2: 97% 96%    General: Pt is alert, awake, not in acute distress Cardiovascular: Irreg rhythm, S1/S2 +, no edema Respiratory: CTA bilaterally, no wheezing, no rhonchi, no respiratory distress, no conversational dyspnea  Abdominal: Soft, NT, ND, bowel sounds + Extremities: no edema, no cyanosis Psych: Normal mood and affect, stable judgement and insight     The results of significant diagnostics from this hospitalization (including imaging, microbiology, ancillary and laboratory) are listed below for reference.     Microbiology: Recent Results (from the past 240 hours)  MRSA Next Gen by PCR, Nasal     Status: None   Collection Time: 09/08/23  4:17 AM   Specimen: Nasal Mucosa; Nasal Swab  Result Value Ref Range Status   MRSA by PCR Next Gen NOT DETECTED NOT DETECTED Final    Comment: (NOTE) The GeneXpert MRSA Assay (FDA approved for NASAL specimens only), is one component of a comprehensive MRSA colonization surveillance program. It is not intended to diagnose MRSA infection nor to guide or monitor treatment for MRSA infections. Test performance is not FDA approved in patients less than 56 years old. Performed at [facility], [address], [place], [place] [address]      Labs: BNP (last 3 results) Recent Labs    06/01/23 1439 06/05/23 1330 06/13/23 0031  BNP 306.2* 166.2* 130.0*   Basic Metabolic Panel: Recent Labs   Lab 09/07/23 2047 09/08/23 0417 09/09/23 0634  NA 139 137 139  K 3.3* 4.3 3.5  CL 106 108 108  CO2 20* 23 25  GLUCOSE 177* 137* 106*  BUN 22 22 21   CREATININE 0.79 0.65 0.68  CALCIUM  8.1* 7.8* 7.9*   Liver Function Tests: Recent Labs  Lab 09/07/23 2047  AST 14*  ALT 8  ALKPHOS 42  BILITOT 0.6  PROT 5.2*  ALBUMIN 2.5*   Recent Labs  Lab 09/07/23 2047  LIPASE 28   No results for input(s): AMMONIA in the last 168 hours. CBC: Recent Labs  Lab 09/07/23 2047 09/08/23 0417 09/08/23 0907 09/08/23 1549 09/08/23 2004 09/09/23 0522 09/09/23 2031 09/10/23 0750 09/11/23 0627  WBC 5.3 2.8*  --  3.0*  --  2.9*  --  3.5* 3.6*  NEUTROABS 4.4  --   --   --   --   --   --   --   --   HGB 6.7* 7.4*   < > 7.2* 7.3* 6.7* 9.3* 9.1* 9.5*  HCT 21.0* 22.1*   < > 21.7* 22.0* 21.1* 28.5* 27.5* 28.3*  MCV 93.3 92.5  --  93.1  --  93.8  --  91.1 90.4  PLT 194 141*  --  147*  --  147*  --  132* 152   < > = values in this interval  not displayed.   Cardiac Enzymes: No results for input(s): CKTOTAL, CKMB, CKMBINDEX, TROPONINI in the last 168 hours. BNP: Invalid input(s): POCBNP CBG: Recent Labs  Lab 09/09/23 1307  GLUCAP 117*   D-Dimer No results for input(s): DDIMER in the last 72 hours. Hgb A1c No results for input(s): HGBA1C in the last 72 hours. Lipid Profile No results for input(s): CHOL, HDL, LDLCALC, TRIG, CHOLHDL, LDLDIRECT in the last 72 hours. Thyroid  function studies No results for input(s): TSH, T4TOTAL, T3FREE, THYROIDAB in the last 72 hours.  Invalid input(s): FREET3 Anemia work up No results for input(s): VITAMINB12, FOLATE, FERRITIN, TIBC, IRON , RETICCTPCT in the last 72 hours. Urinalysis No results found for: COLORURINE, APPEARANCEUR, LABSPEC, PHURINE, GLUCOSEU, HGBUR, BILIRUBINUR, KETONESUR, PROTEINUR, UROBILINOGEN, NITRITE, LEUKOCYTESUR Sepsis Labs Recent Labs  Lab 09/08/23 1549  09/09/23 0522 09/10/23 0750 09/11/23 0627  WBC 3.0* 2.9* 3.5* 3.6*   Microbiology Recent Results (from the past 240 hours)  MRSA Next Gen by PCR, Nasal     Status: None   Collection Time: 09/08/23  4:17 AM   Specimen: Nasal Mucosa; Nasal Swab  Result Value Ref Range Status   MRSA by PCR Next Gen NOT DETECTED NOT DETECTED Final    Comment: (NOTE) The GeneXpert MRSA Assay (FDA approved for NASAL specimens only), is one component of a comprehensive MRSA colonization surveillance program. It is not intended to diagnose MRSA infection nor to guide or monitor treatment for MRSA infections. Test performance is not FDA approved in patients less than 26 years old. Performed at [facility], [address] [place], [place] [address]      Patient was seen and examined on the day of discharge and was found to be in stable condition. Time coordinating discharge: 35 minutes including assessment and coordination of care, as well as examination of the patient.   SIGNED:  Delon Hoe, DO [facility] 09/11/2023, 12:59 PM

## 2023-09-11 NOTE — Plan of Care (Signed)
  Problem: Education: Goal: Knowledge of General Education information will improve Description: Including pain rating scale, medication(s)/side effects and non-pharmacologic comfort measures 09/11/2023 1312 by Alveria Reus, Gladis, RN Outcome: Completed/Met 09/11/2023 1312 by Alveria Reus, Gladis, RN Outcome: Completed/Met   Problem: Health Behavior/Discharge Planning: Goal: Ability to manage health-related needs will improve 09/11/2023 1312 by Alveria Reus, Gladis, RN Outcome: Completed/Met 09/11/2023 1312 by Alveria Reus, Gladis, RN Outcome: Completed/Met   Problem: Clinical Measurements: Goal: Ability to maintain clinical measurements within normal limits will improve 09/11/2023 1312 by Alveria Reus, Gladis, RN Outcome: Completed/Met 09/11/2023 1312 by Alveria Reus, Gladis, RN Outcome: Completed/Met Goal: Will remain free from infection 09/11/2023 1312 by Alveria Reus, Gladis, RN Outcome: Completed/Met 09/11/2023 1312 by Alveria Reus, Gladis, RN Outcome: Completed/Met Goal: Diagnostic test results will improve 09/11/2023 1312 by Alveria Reus, Gladis, RN Outcome: Completed/Met 09/11/2023 1312 by Alveria Reus, Gladis, RN Outcome: Completed/Met Goal: Respiratory complications will improve 09/11/2023 1312 by Alveria Reus Gladis, RN Outcome: Completed/Met 09/11/2023 1312 by Alveria Reus, Gladis, RN Outcome: Completed/Met Goal: Cardiovascular complication will be avoided 09/11/2023 1312 by Alveria Reus, Gladis, RN Outcome: Completed/Met 09/11/2023 1312 by Alveria Reus Gladis, RN Outcome: Completed/Met   Problem: Activity: Goal: Risk for activity intolerance will decrease 09/11/2023 1312 by Alveria Reus, Gladis, RN Outcome: Completed/Met 09/11/2023 1312 by Alveria Reus, Elizeth Weinrich, RN Outcome: Completed/Met   Problem: Nutrition: Goal: Adequate nutrition will be maintained 09/11/2023 1312 by Alveria Reus, Gladis, RN Outcome: Completed/Met 09/11/2023 1312 by Alveria Reus Gladis, RN Outcome:  Completed/Met   Problem: Coping: Goal: Level of anxiety will decrease Outcome: Completed/Met   Problem: Elimination: Goal: Will not experience complications related to bowel motility Outcome: Completed/Met Goal: Will not experience complications related to urinary retention Outcome: Completed/Met   Problem: Pain Managment: Goal: General experience of comfort will improve and/or be controlled Outcome: Completed/Met   Problem: Safety: Goal: Ability to remain free from injury will improve Outcome: Completed/Met   Problem: Skin Integrity: Goal: Risk for impaired skin integrity will decrease Outcome: Completed/Met

## 2023-09-11 NOTE — Care Management Important Message (Signed)
 Important Message  Patient Details  Name: Raymond Hurst MRN: [id number] Date of Birth: 11-10-1950   Important Message Given:  Yes - Medicare IM     Claretta Deed 09/11/2023, 4:31 PM

## 2023-09-11 NOTE — Telephone Encounter (Signed)
 [facility] called to report that they did not have enough tissue for testing and she confirmed w/pathology there was no other available tissue. Is there tissue elsewhere? If not, OK to cancel the order? Call her at [phone] option 1, then ext [phone] Call from Joann

## 2023-09-11 NOTE — Plan of Care (Signed)

## 2023-09-11 NOTE — Evaluation (Signed)
 Physical Therapy Evaluation Patient Details Name: Raymond Hurst MRN: [id number] DOB: 07-Aug-1950 Today's Date: 09/11/2023  History of Present Illness  73 yo male presents [facility] on 8/7 with x3 days rectal bleeding, hypotension. S/p upper GI endoscopy findings Deep cratered ulcer in the duodenum s/p epinephrine  injection 8/9. S/p IR mesenteric angiography with GDA micro coil embolization 8/9. PMH includes afib, gout, GI bleed, stage IV lung cancer, alcohol abuse.  Clinical Impression  Pt agreeable to therapy and in bed at start of session. Pt demonstrated independence with bed mobility and transfers. Pt ambulated household distances without any signs of LOB. Pt endorses that he feels slightly weaker versus baseline, but anticipate great recovery at home with regular mobility. SpO2 92-97% on RA throughout session. PT educated that he will continue to progress and pt verbalizes understanding he will continue to progress with increased activity. Pt is close to baseline and will not benefit from further PT. PT to sign off for now.     If plan is discharge home, recommend the following: Assist for transportation   Can travel by private vehicle        Equipment Recommendations None recommended by PT  Recommendations for Other Services       Functional Status Assessment Patient has not had a recent decline in their functional status     Precautions / Restrictions Precautions Precautions: None Restrictions Weight Bearing Restrictions Per Provider Order: No      Mobility  Bed Mobility Overal bed mobility: Independent                  Transfers Overall transfer level: Needs assistance Equipment used: None Transfers: Sit to/from Stand Sit to Stand: Supervision           General transfer comment: For safety    Ambulation/Gait Ambulation/Gait assistance: Supervision Gait Distance (Feet): 250 Feet Assistive device: None Gait Pattern/deviations: Step-through pattern,  Decreased stride length       General Gait Details: Increased out-toeing bilaterally  Stairs            Wheelchair Mobility     Tilt Bed    Modified Rankin (Stroke Patients Only)       Balance Overall balance assessment: Independent                                           Pertinent Vitals/Pain Pain Assessment Pain Assessment: No/denies pain    Home Living Family/patient expects to be discharged to:: Private residence Living Arrangements: Spouse/significant other Available Help at Discharge: Family Type of Home: House Home Access: Stairs to enter Entrance Stairs-Rails: None Entrance Stairs-Number of Steps: 1 Alternate Level Stairs-Number of Steps: 15 Home Layout: Two level Home Equipment: Agricultural consultant (2 wheels)      Prior Function Prior Level of Function : Independent/Modified Independent;Driving             Mobility Comments: Ambulates without AD ADLs Comments: Guitar player     Extremity/Trunk Assessment   Upper Extremity Assessment Upper Extremity Assessment: Overall WFL for tasks assessed    Lower Extremity Assessment Lower Extremity Assessment: Overall WFL for tasks assessed    Cervical / Trunk Assessment Cervical / Trunk Assessment: Normal  Communication   Communication Communication: No apparent difficulties    Cognition Arousal: Alert Behavior During Therapy: WFL for tasks assessed/performed   PT - Cognitive impairments: No apparent impairments  Following commands: Intact       Cueing Cueing Techniques: Verbal cues, Gestural cues     General Comments General comments (skin integrity, edema, etc.): SpO2 92-97% on RA while ambulating. Modified DGI and no significant LOB (head turns, step over, gait speed, stop and turn). VSS    Exercises     Assessment/Plan    PT Assessment Patient does not need any further PT services  PT Problem List         PT Treatment  Interventions      PT Goals (Current goals can be found in the Care Plan section)  Acute Rehab PT Goals Patient Stated Goal: Return home PT Goal Formulation: With patient Time For Goal Achievement: 09/11/23 Potential to Achieve Goals: Good    Frequency       Co-evaluation               AM-PAC PT 6 Clicks Mobility  Outcome Measure Help needed turning from your back to your side while in a flat bed without using bedrails?: None Help needed moving from lying on your back to sitting on the side of a flat bed without using bedrails?: None Help needed moving to and from a bed to a chair (including a wheelchair)?: None Help needed standing up from a chair using your arms (e.g., wheelchair or bedside chair)?: None Help needed to walk in hospital room?: None Help needed climbing 3-5 steps with a railing? : None 6 Click Score: 24    End of Session Equipment Utilized During Treatment: Gait belt Activity Tolerance: Patient tolerated treatment well Patient left: in chair;with call bell/phone within reach Nurse Communication: Mobility status PT Visit Diagnosis: Muscle weakness (generalized) (M62.81)    Time: 8791-8773 PT Time Calculation (min) (ACUTE ONLY): 18 min   Charges:   PT Evaluation $PT Eval Low Complexity: 1 Low   PT General Charges $$ ACUTE PT VISIT: 1 Visit         Quintin Campi, SPT  Acute Rehab  [phone]   Quintin Campi 09/11/2023, 1:50 PM

## 2023-09-11 NOTE — Progress Notes (Signed)
 IP PROGRESS NOTE  Subjective:   [name] is well-known to me with a history of non-small cell lung cancer.  He completed a course of concurrent weekly Taxol /carboplatin  and chest/adrenal gland radiation over the past few weeks. He reports feeling well until he developed dizziness and hematemesis on 09/07/2023.  He presented to the emergency room and was found to hemoglobin of 6.7. He was admitted and gastroenterology was consulted.  An upper endoscopy 09/09/2023 a 1.5 cm ulcer with adherent clot was noted in the duodenum.  There was a visible vessel.  The ulcer was injected with epinephrine .  Blood and fluid were noted in the stomach. He was transferred [facility] to [facility] on 09/09/2023 and underwent a coil embolization of the GDA was performed He was transfused 4 units of packed red blood cells Raymond Hurst has no complaint today.  No neuropathy symptoms. Objective: Vital signs in last 24 hours: Blood pressure 96/68, pulse 99, temperature 98.3 F (36.8 C), temperature source Oral, resp. rate 19, height 6' (1.829 m), weight 263 lb 0.1 oz (119.3 kg), SpO2 96%.  Intake/Output from previous day: 08/10 0701 - 08/11 0700 In: -  Out: 650 [Urine:650]  Physical Exam:  HEENT: No thrush Lungs: Good air movement bilateral, mild end expiratory wheeze bilaterally Cardiac: Irregular  Abdomen: No hepatosplenomegaly Extremities: No leg edema   Lab Results: Recent Labs    09/10/23 0750 09/11/23 0627  WBC 3.5* 3.6*  HGB 9.1* 9.5*  HCT 27.5* 28.3*  PLT 132* 152    BMET Recent Labs    09/09/23 0634  NA 139  K 3.5  CL 108  CO2 25  GLUCOSE 106*  BUN 21  CREATININE 0.68  CALCIUM  7.9*    No results found for: CEA1, CEA, CAN199, CA125  Studies/Results: IR EMBO ART  VEN HEMORR LYMPH EXTRAV  INC GUIDE ROADMAPPING Result Date: 09/09/2023 INDICATION: Large crater acute duodenal ulcer with acute blood loss anemia requiring transfusion. EXAM: ULTRASOUND GUIDANCE FOR VASCULAR ACCESS  SMA, CELIAC, AND GDA CATHETERIZATIONS AND ANGIOGRAMS GDA MICRO COIL EMBOLIZATION MEDICATIONS: 1% lidocaine  local. ANESTHESIA/SEDATION: Moderate (conscious) sedation was employed during this procedure. A total of Versed  1.0 mg and Fentanyl  50 mcg was administered intravenously by the radiology nurse. Total intra-service moderate Sedation Time: 36 minutes. The patient's level of consciousness and vital signs were monitored continuously by radiology nursing throughout the procedure under my direct supervision. CONTRAST:  55 cc omni 300 FLUOROSCOPY: Radiation Exposure Index (as provided by the fluoroscopic device): 812 mGy Kerma COMPLICATIONS: None immediate. PROCEDURE: Informed consent was obtained from the patient following explanation of the procedure, risks, benefits and alternatives. The patient understands, agrees and consents for the procedure. All questions were addressed. A time out was performed prior to the initiation of the procedure. Maximal barrier sterile technique utilized including caps, mask, sterile gowns, sterile gloves, large sterile drape, hand hygiene, and Betadine prep. Previous imaging reviewed. Under sterile conditions and local anesthesia, ultrasound micropuncture access performed of the right common femoral artery. Images obtained for documentation of the patent right common femoral artery. 018 guidewire advanced followed by the 4 [place] dilator set. Bentson guidewire inserted followed by 5 French sheath. C2 catheter utilized to select the SMA origin. Initial SMA angiogram performed. SMA: SMA is widely patent with nonocclusive calcified atherosclerosis of the wall. Replaced right hepatic artery noted. Catheter retracted and utilized to select the celiac origin. Celiac angiogram performed. Celiac: Celiac, hepatic, small accessory right hepatic, gastroduodenal artery, and gastroepiploic artery are all patent. No  active bleeding demonstrated. Renegade STC microcatheter over a single angle GT  Glidewire was advanced into the GDA. Selective GDA angiogram performed. GDA: GDA and gastroepiploic vasculature all patent. No active bleeding demonstrated. Preventative micro coil embolization of the GDA for the known large crater duodenal ulcer: Successful micro coil embolization performed with a total of 9 interlock 2D fibered coils ranging in diameter from 6-10 mm and in various lengths from 10 to 30 cm. Following embolization, there is complete coil occlusion of the GDA. Final celiac angiogram confirms preserved patency of the celiac and its branches including the splenic, left gastric, replaced left hepatic, and accessory right hepatic vasculature. Access removed. Hemostasis obtained with the CELT device successfully. IMPRESSION: Successful GDA micro coil embolization as above for the endoscopically proven large crater like acute duodenal ulcer with recent acute upper GI bleeding. Electronically Signed   By: CHRISTELLA.  Shick M.D.   On: 09/09/2023 16:53   IR Angiogram Visceral Selective Result Date: 09/09/2023 INDICATION: Large crater acute duodenal ulcer with acute blood loss anemia requiring transfusion. EXAM: ULTRASOUND GUIDANCE FOR VASCULAR ACCESS SMA, CELIAC, AND GDA CATHETERIZATIONS AND ANGIOGRAMS GDA MICRO COIL EMBOLIZATION MEDICATIONS: 1% lidocaine  local. ANESTHESIA/SEDATION: Moderate (conscious) sedation was employed during this procedure. A total of Versed  1.0 mg and Fentanyl  50 mcg was administered intravenously by the radiology nurse. Total intra-service moderate Sedation Time: 36 minutes. The patient's level of consciousness and vital signs were monitored continuously by radiology nursing throughout the procedure under my direct supervision. CONTRAST:  55 cc omni 300 FLUOROSCOPY: Radiation Exposure Index (as provided by the fluoroscopic device): 812 mGy Kerma COMPLICATIONS: None immediate. PROCEDURE: Informed consent was obtained from the patient following explanation of the procedure, risks, benefits  and alternatives. The patient understands, agrees and consents for the procedure. All questions were addressed. A time out was performed prior to the initiation of the procedure. Maximal barrier sterile technique utilized including caps, mask, sterile gowns, sterile gloves, large sterile drape, hand hygiene, and Betadine prep. Previous imaging reviewed. Under sterile conditions and local anesthesia, ultrasound micropuncture access performed of the right common femoral artery. Images obtained for documentation of the patent right common femoral artery. 018 guidewire advanced followed by the 4 [place] dilator set. Bentson guidewire inserted followed by 5 French sheath. C2 catheter utilized to select the SMA origin. Initial SMA angiogram performed. SMA: SMA is widely patent with nonocclusive calcified atherosclerosis of the wall. Replaced right hepatic artery noted. Catheter retracted and utilized to select the celiac origin. Celiac angiogram performed. Celiac: Celiac, hepatic, small accessory right hepatic, gastroduodenal artery, and gastroepiploic artery are all patent. No active bleeding demonstrated. Renegade STC microcatheter over a single angle GT Glidewire was advanced into the GDA. Selective GDA angiogram performed. GDA: GDA and gastroepiploic vasculature all patent. No active bleeding demonstrated. Preventative micro coil embolization of the GDA for the known large crater duodenal ulcer: Successful micro coil embolization performed with a total of 9 interlock 2D fibered coils ranging in diameter from 6-10 mm and in various lengths from 10 to 30 cm. Following embolization, there is complete coil occlusion of the GDA. Final celiac angiogram confirms preserved patency of the celiac and its branches including the splenic, left gastric, replaced left hepatic, and accessory right hepatic vasculature. Access removed. Hemostasis obtained with the CELT device successfully. IMPRESSION: Successful GDA micro coil  embolization as above for the endoscopically proven large crater like acute duodenal ulcer with recent acute upper GI bleeding. Electronically Signed   By: CHRISTELLA.  Shick M.D.  On: 09/09/2023 16:53   IR US  Guide Vasc Access Right Result Date: 09/09/2023 INDICATION: Large crater acute duodenal ulcer with acute blood loss anemia requiring transfusion. EXAM: ULTRASOUND GUIDANCE FOR VASCULAR ACCESS SMA, CELIAC, AND GDA CATHETERIZATIONS AND ANGIOGRAMS GDA MICRO COIL EMBOLIZATION MEDICATIONS: 1% lidocaine  local. ANESTHESIA/SEDATION: Moderate (conscious) sedation was employed during this procedure. A total of Versed  1.0 mg and Fentanyl  50 mcg was administered intravenously by the radiology nurse. Total intra-service moderate Sedation Time: 36 minutes. The patient's level of consciousness and vital signs were monitored continuously by radiology nursing throughout the procedure under my direct supervision. CONTRAST:  55 cc omni 300 FLUOROSCOPY: Radiation Exposure Index (as provided by the fluoroscopic device): 812 mGy Kerma COMPLICATIONS: None immediate. PROCEDURE: Informed consent was obtained from the patient following explanation of the procedure, risks, benefits and alternatives. The patient understands, agrees and consents for the procedure. All questions were addressed. A time out was performed prior to the initiation of the procedure. Maximal barrier sterile technique utilized including caps, mask, sterile gowns, sterile gloves, large sterile drape, hand hygiene, and Betadine prep. Previous imaging reviewed. Under sterile conditions and local anesthesia, ultrasound micropuncture access performed of the right common femoral artery. Images obtained for documentation of the patent right common femoral artery. 018 guidewire advanced followed by the 4 [place] dilator set. Bentson guidewire inserted followed by 5 French sheath. C2 catheter utilized to select the SMA origin. Initial SMA angiogram performed. SMA: SMA is widely  patent with nonocclusive calcified atherosclerosis of the wall. Replaced right hepatic artery noted. Catheter retracted and utilized to select the celiac origin. Celiac angiogram performed. Celiac: Celiac, hepatic, small accessory right hepatic, gastroduodenal artery, and gastroepiploic artery are all patent. No active bleeding demonstrated. Renegade STC microcatheter over a single angle GT Glidewire was advanced into the GDA. Selective GDA angiogram performed. GDA: GDA and gastroepiploic vasculature all patent. No active bleeding demonstrated. Preventative micro coil embolization of the GDA for the known large crater duodenal ulcer: Successful micro coil embolization performed with a total of 9 interlock 2D fibered coils ranging in diameter from 6-10 mm and in various lengths from 10 to 30 cm. Following embolization, there is complete coil occlusion of the GDA. Final celiac angiogram confirms preserved patency of the celiac and its branches including the splenic, left gastric, replaced left hepatic, and accessory right hepatic vasculature. Access removed. Hemostasis obtained with the CELT device successfully. IMPRESSION: Successful GDA micro coil embolization as above for the endoscopically proven large crater like acute duodenal ulcer with recent acute upper GI bleeding. Electronically Signed   By: CHRISTELLA.  Shick M.D.   On: 09/09/2023 16:53   IR Angiogram Selective Each Additional Vessel Result Date: 09/09/2023 INDICATION: Large crater acute duodenal ulcer with acute blood loss anemia requiring transfusion. EXAM: ULTRASOUND GUIDANCE FOR VASCULAR ACCESS SMA, CELIAC, AND GDA CATHETERIZATIONS AND ANGIOGRAMS GDA MICRO COIL EMBOLIZATION MEDICATIONS: 1% lidocaine  local. ANESTHESIA/SEDATION: Moderate (conscious) sedation was employed during this procedure. A total of Versed  1.0 mg and Fentanyl  50 mcg was administered intravenously by the radiology nurse. Total intra-service moderate Sedation Time: 36 minutes. The patient's  level of consciousness and vital signs were monitored continuously by radiology nursing throughout the procedure under my direct supervision. CONTRAST:  55 cc omni 300 FLUOROSCOPY: Radiation Exposure Index (as provided by the fluoroscopic device): 812 mGy Kerma COMPLICATIONS: None immediate. PROCEDURE: Informed consent was obtained from the patient following explanation of the procedure, risks, benefits and alternatives. The patient understands, agrees and consents for the procedure. All questions were  addressed. A time out was performed prior to the initiation of the procedure. Maximal barrier sterile technique utilized including caps, mask, sterile gowns, sterile gloves, large sterile drape, hand hygiene, and Betadine prep. Previous imaging reviewed. Under sterile conditions and local anesthesia, ultrasound micropuncture access performed of the right common femoral artery. Images obtained for documentation of the patent right common femoral artery. 018 guidewire advanced followed by the 4 [place] dilator set. Bentson guidewire inserted followed by 5 French sheath. C2 catheter utilized to select the SMA origin. Initial SMA angiogram performed. SMA: SMA is widely patent with nonocclusive calcified atherosclerosis of the wall. Replaced right hepatic artery noted. Catheter retracted and utilized to select the celiac origin. Celiac angiogram performed. Celiac: Celiac, hepatic, small accessory right hepatic, gastroduodenal artery, and gastroepiploic artery are all patent. No active bleeding demonstrated. Renegade STC microcatheter over a single angle GT Glidewire was advanced into the GDA. Selective GDA angiogram performed. GDA: GDA and gastroepiploic vasculature all patent. No active bleeding demonstrated. Preventative micro coil embolization of the GDA for the known large crater duodenal ulcer: Successful micro coil embolization performed with a total of 9 interlock 2D fibered coils ranging in diameter from 6-10 mm and  in various lengths from 10 to 30 cm. Following embolization, there is complete coil occlusion of the GDA. Final celiac angiogram confirms preserved patency of the celiac and its branches including the splenic, left gastric, replaced left hepatic, and accessory right hepatic vasculature. Access removed. Hemostasis obtained with the CELT device successfully. IMPRESSION: Successful GDA micro coil embolization as above for the endoscopically proven large crater like acute duodenal ulcer with recent acute upper GI bleeding. Electronically Signed   By: CHRISTELLA.  Shick M.D.   On: 09/09/2023 16:53   IR Angiogram Visceral Selective Result Date: 09/09/2023 INDICATION: Large crater acute duodenal ulcer with acute blood loss anemia requiring transfusion. EXAM: ULTRASOUND GUIDANCE FOR VASCULAR ACCESS SMA, CELIAC, AND GDA CATHETERIZATIONS AND ANGIOGRAMS GDA MICRO COIL EMBOLIZATION MEDICATIONS: 1% lidocaine  local. ANESTHESIA/SEDATION: Moderate (conscious) sedation was employed during this procedure. A total of Versed  1.0 mg and Fentanyl  50 mcg was administered intravenously by the radiology nurse. Total intra-service moderate Sedation Time: 36 minutes. The patient's level of consciousness and vital signs were monitored continuously by radiology nursing throughout the procedure under my direct supervision. CONTRAST:  55 cc omni 300 FLUOROSCOPY: Radiation Exposure Index (as provided by the fluoroscopic device): 812 mGy Kerma COMPLICATIONS: None immediate. PROCEDURE: Informed consent was obtained from the patient following explanation of the procedure, risks, benefits and alternatives. The patient understands, agrees and consents for the procedure. All questions were addressed. A time out was performed prior to the initiation of the procedure. Maximal barrier sterile technique utilized including caps, mask, sterile gowns, sterile gloves, large sterile drape, hand hygiene, and Betadine prep. Previous imaging reviewed. Under sterile  conditions and local anesthesia, ultrasound micropuncture access performed of the right common femoral artery. Images obtained for documentation of the patent right common femoral artery. 018 guidewire advanced followed by the 4 [place] dilator set. Bentson guidewire inserted followed by 5 French sheath. C2 catheter utilized to select the SMA origin. Initial SMA angiogram performed. SMA: SMA is widely patent with nonocclusive calcified atherosclerosis of the wall. Replaced right hepatic artery noted. Catheter retracted and utilized to select the celiac origin. Celiac angiogram performed. Celiac: Celiac, hepatic, small accessory right hepatic, gastroduodenal artery, and gastroepiploic artery are all patent. No active bleeding demonstrated. Renegade STC microcatheter over a single angle GT Glidewire was advanced into the GDA.  Selective GDA angiogram performed. GDA: GDA and gastroepiploic vasculature all patent. No active bleeding demonstrated. Preventative micro coil embolization of the GDA for the known large crater duodenal ulcer: Successful micro coil embolization performed with a total of 9 interlock 2D fibered coils ranging in diameter from 6-10 mm and in various lengths from 10 to 30 cm. Following embolization, there is complete coil occlusion of the GDA. Final celiac angiogram confirms preserved patency of the celiac and its branches including the splenic, left gastric, replaced left hepatic, and accessory right hepatic vasculature. Access removed. Hemostasis obtained with the CELT device successfully. IMPRESSION: Successful GDA micro coil embolization as above for the endoscopically proven large crater like acute duodenal ulcer with recent acute upper GI bleeding. Electronically Signed   By: CHRISTELLA.  Shick M.D.   On: 09/09/2023 16:53    Medications: I have reviewed the patient's current medications.  Assessment/Plan: Left lung mass CT chest 06/05/2023: Left upper lobe mass with invasion of the mediastinum and  effacement of the left upper lobe and lower lobe pulmonary arteries, moderate left pleural effusion, small mediastinal lymph nodes, right adrenal mass favoring myolipoma, probable benign lucent lesion at the right scapula Diagnostic left thoracentesis 06/06/2023: Cytology-no malignant cells identified Bronchoscopy with endobronchial ultrasound and biopsies 06/12/2023-lateral left mainstem bronchial irregularity extending into the left upper lobe airways narrowing the left upper lobe airways, no discrete endobronchial lesion.  Endobronchial ultrasound showed a left hilar mass with significant vascularity; enlargement at station 4L, 11L, 7.  Station 7, 4L and 11L lymph nodes negative for malignancy; left hilar mass/lymph node metastatic carcinoma; left upper lobe endobronchial brushing malignant cells present; left upper lobe endobronchial biopsy suspicious for malignancy; weak focal p63 positivity in the tumor cells, negative for TTF-1, synaptophysin and CD56, a poorly differentiated squamous cell carcinoma is favored; PD-L1 pending; foundation 1-HRD signature/microsatellite status/tumor mutation burden cannot be determined, IDH1 [id number]. PET scan 06/20/2023-large left hilar mass primarily in the left upper lobe.  Left adrenal gland with hypermetabolic activity suspicious for early metastatic lesion. Brain MRI 06/30/2023-negative for metastatic disease Week 1 Taxol /carboplatin  07/10/2023 Radiation chest left adrenal metastasis 07/11/2023-09/01/2023 Week 2 Taxol /carboplatin  07/17/2023 Week 3 Taxol /carboplatin  07/24/2023 Week 4 Taxol /carboplatin  08/01/2023 Week 5 Taxol /carboplatin  08/08/2023 Week 6 Taxol /carboplatin  08/14/2023 Week 7 Taxol /carboplatin  08/23/2023 Bronchoscopy 08/20/2023 with biopsy of the left upper lobe mass-non-small cell carcinoma-adenocarcinoma favored   2.  Hoarseness/cough secondary to #1-improved following vocal cord procedure 06/29/2023, Restylane injection into bilateral lateral thyroid  arytenoid  muscles 07/31/2023 3.  Atrial fibrillation 4.  Lower extremity edema 5.  Alcohol use 6.  Large external hemorrhoid on exam 07/17/2023 7.  Admission 09/07/2023 with acute upper GI bleeding 09/09/2023 EGD-duodenal ulcer with a visible vessel treated with epinephrine  09/09/2023-IR coil embolization of the GDA   Raymond Hurst has a history of metastatic lung cancer.  He recently completed a course of concurrent weekly paclitaxel /carboplatin  chemotherapy with radiation to the chest and left adrenal gland.  He underwent a bronchoscopy and biopsy of the left upper lobe mass on 08/28/2023 in order to obtain tissue for PD-L1 testing.  The PD-L1 result is pending.  The plan is to begin adjuvant immunotherapy if the PD-L1 returns positive.  Raymond Hurst was admitted 09/07/2023 with acute upper GI bleeding.  The hemoglobin is stable after undergoing injection of a bleeding duodenal ulcer and GDA embolization.  He was scheduled for follow-up at the Cancer center today.  Outpatient follow-up will be rescheduled for next week.  Recommendations: Management of the duodenal ulcer and anticoagulation therapy per  GI and the medical service Outpatient follow-up will be scheduled Cancer center when results of the PD-L1 testing are available, I will follow-up on this today Please call oncology as needed   LOS: 4 days   Arley Hof, MD   09/11/2023, 1:17 PM

## 2023-09-12 ENCOUNTER — Encounter (HOSPITAL_COMMUNITY): Payer: Self-pay | Admitting: Internal Medicine

## 2023-09-12 ENCOUNTER — Other Ambulatory Visit: Payer: Self-pay

## 2023-09-12 ENCOUNTER — Other Ambulatory Visit: Payer: Self-pay | Admitting: *Deleted

## 2023-09-12 ENCOUNTER — Other Ambulatory Visit (HOSPITAL_COMMUNITY): Payer: Self-pay

## 2023-09-12 DIAGNOSIS — C3412 Malignant neoplasm of upper lobe, left bronchus or lung: Secondary | ICD-10-CM

## 2023-09-12 MED ORDER — FOLIC ACID 1 MG PO TABS: 1.0000 mg | ORAL_TABLET | Freq: Every day | ORAL | 0 refills | Status: DC

## 2023-09-12 NOTE — Care Management Important Message (Signed)
 Important Message  Patient Details  Name: Raymond Hurst MRN: [id number] Date of Birth: 01/27/1951   Important Message Given:  Yes - Medicare IM  Patient left prior to IM delivery will mail a letter to the patient home address.   [name] 09/12/2023, 9:47 AM

## 2023-09-12 NOTE — Progress Notes (Signed)
 Per Dr. Cloretta: [facility] d/c today and will need f/u on 8/20 or 8/22 with BS or [facility] with lab. Scheduling message sent (will try for 8/20 in case he needs transfusion). Lab orders placed.

## 2023-09-13 ENCOUNTER — Encounter (HOSPITAL_COMMUNITY): Payer: Self-pay

## 2023-09-13 ENCOUNTER — Other Ambulatory Visit: Payer: Self-pay

## 2023-09-13 ENCOUNTER — Inpatient Hospital Stay: Attending: Nurse Practitioner

## 2023-09-13 ENCOUNTER — Emergency Department (HOSPITAL_COMMUNITY)
Admission: EM | Admit: 2023-09-13 | Discharge: 2023-09-13 | Disposition: A | Discharge status: Home / Self Care | Attending: Emergency Medicine | Admitting: Emergency Medicine

## 2023-09-13 ENCOUNTER — Emergency Department (HOSPITAL_COMMUNITY)

## 2023-09-13 DIAGNOSIS — J90 Pleural effusion, not elsewhere classified: Secondary | ICD-10-CM | POA: Insufficient documentation

## 2023-09-13 DIAGNOSIS — C7972 Secondary malignant neoplasm of left adrenal gland: Secondary | ICD-10-CM | POA: Insufficient documentation

## 2023-09-13 DIAGNOSIS — R600 Localized edema: Secondary | ICD-10-CM | POA: Insufficient documentation

## 2023-09-13 DIAGNOSIS — R55 Syncope and collapse: Secondary | ICD-10-CM | POA: Diagnosis not present

## 2023-09-13 DIAGNOSIS — Z7901 Long term (current) use of anticoagulants: Secondary | ICD-10-CM | POA: Insufficient documentation

## 2023-09-13 DIAGNOSIS — R918 Other nonspecific abnormal finding of lung field: Secondary | ICD-10-CM | POA: Diagnosis not present

## 2023-09-13 DIAGNOSIS — R0989 Other specified symptoms and signs involving the circulatory and respiratory systems: Secondary | ICD-10-CM | POA: Diagnosis not present

## 2023-09-13 DIAGNOSIS — R42 Dizziness and giddiness: Secondary | ICD-10-CM | POA: Diagnosis not present

## 2023-09-13 DIAGNOSIS — C3412 Malignant neoplasm of upper lobe, left bronchus or lung: Secondary | ICD-10-CM | POA: Insufficient documentation

## 2023-09-13 DIAGNOSIS — R531 Weakness: Secondary | ICD-10-CM | POA: Diagnosis not present

## 2023-09-13 LAB — COMPREHENSIVE METABOLIC PANEL WITH GFR
ALT: 11 U/L (ref 0–44)
AST: 17 U/L (ref 15–41)
Albumin: 2.9 g/dL — ABNORMAL LOW (ref 3.5–5.0)
Alkaline Phosphatase: 61 U/L (ref 38–126)
Anion gap: 14 (ref 5–15)
BUN: 11 mg/dL (ref 8–23)
CO2: 20 mmol/L — ABNORMAL LOW (ref 22–32)
Calcium: 8.5 mg/dL — ABNORMAL LOW (ref 8.9–10.3)
Chloride: 100 mmol/L (ref 98–111)
Creatinine, Ser: 0.86 mg/dL (ref 0.61–1.24)
GFR, Estimated: >60 mL/min (ref >60)
Glucose, Bld: 145 mg/dL — ABNORMAL HIGH (ref 70–99)
Potassium: 3.2 mmol/L — ABNORMAL LOW (ref 3.5–5.1)
Sodium: 134 mmol/L — ABNORMAL LOW (ref 135–145)
Total Bilirubin: 1 mg/dL (ref 0.0–1.2)
Total Protein: 6 g/dL — ABNORMAL LOW (ref 6.5–8.1)

## 2023-09-13 LAB — CBC WITH DIFFERENTIAL/PLATELET
Abs Immature Granulocytes: 0.04 K/uL (ref 0.00–0.07)
Basophils Absolute: 0 K/uL (ref 0.0–0.1)
Basophils Relative: 0 %
Eosinophils Absolute: 0 K/uL (ref 0.0–0.5)
Eosinophils Relative: 0 %
HCT: 33 % — ABNORMAL LOW (ref 39.0–52.0)
Hemoglobin: 11.1 g/dL — ABNORMAL LOW (ref 13.0–17.0)
Immature Granulocytes: 1 %
Lymphocytes Relative: 21 %
Lymphs Abs: 0.8 K/uL (ref 0.7–4.0)
MCH: 30.7 pg (ref 26.0–34.0)
MCHC: 33.6 g/dL (ref 30.0–36.0)
MCV: 91.2 fL (ref 80.0–100.0)
Monocytes Absolute: 0.3 K/uL (ref 0.1–1.0)
Monocytes Relative: 7 %
Neutro Abs: 2.7 K/uL (ref 1.7–7.7)
Neutrophils Relative %: 71 %
Platelets: 275 K/uL (ref 150–400)
RBC: 3.62 MIL/uL — ABNORMAL LOW (ref 4.22–5.81)
RDW: 19.9 % — ABNORMAL HIGH (ref 11.5–15.5)
WBC: 3.8 K/uL — ABNORMAL LOW (ref 4.0–10.5)
nRBC: 0 % (ref 0.0–0.2)

## 2023-09-13 LAB — TYPE AND SCREEN
ABO/RH(D): O NEG
Antibody Screen: NEGATIVE

## 2023-09-13 LAB — PROTIME-INR
INR: 1 (ref 0.8–1.2)
Prothrombin Time: 14.3 s (ref 11.4–15.2)

## 2023-09-13 LAB — MOLECULAR PATHOLOGY

## 2023-09-13 LAB — TROPONIN I (HIGH SENSITIVITY): Troponin I (High Sensitivity): 6 ng/L (ref <18)

## 2023-09-13 MED ORDER — METOPROLOL TARTRATE 5 MG/5ML IV SOLN
5.0000 mg | Freq: Once | INTRAVENOUS | Status: AC
  Administered 2023-09-13 (×2): 5 mg via INTRAVENOUS
  Filled 2023-09-13: qty 5

## 2023-09-13 MED ORDER — SODIUM CHLORIDE 0.9 % IV SOLN: INTRAVENOUS | Status: DC

## 2023-09-13 NOTE — Progress Notes (Signed)
 [facility] CSW Progress Note  Clinical Child psychotherapist contacted patient by phone to follow-up on voicemail left by spouse regarding [employer] pest control.   Of note- request for pest assistance was made on 7/15. Initial pest Assessment was scheduled for 7/22, patient / spouse cancelled appointment. Appointment was rescheduled for 7/25, patient/spouse cancelled appointment. Appointment was rescheduled for 8/08, patient/spouse cancelled due to hospitalization.    CSW informed patient that part of the eligibility for pest control is being in active treatment. Presently, patient has completed treatment. CSW will discuss will consult with supervisor. CSW encouraged patient to keep 8/25 appointment. CSW encouraged patient / spouse to contact me if needed.    Lizbeth Sprague, LCSW Clinical Social Worker [facility]

## 2023-09-13 NOTE — Discharge Instructions (Signed)
 The great news is that your blood levels continue to go up as we expected.  It does not appear that you are continuing to have any bleeding.  All of your testing has been reassuring, your vital signs look good heart rate and blood pressure look good.  Please continue to take your medications exactly as prescribed especially for your atrial fibrillation.  Please continue to hold the Eliquis  until you are instructed to get back on it by your doctors.  Return to the ER for severe or worsening symptoms.

## 2023-09-13 NOTE — ED Provider Notes (Signed)
 [place] EMERGENCY DEPARTMENT AT [facility] Provider Note   CSN: [id number] Arrival date & time: 09/13/23  1936     Patient presents with: Dizziness and Near Syncope   Raymond Hurst is a 73 y.o. male.    Dizziness Near Syncope   This patient is a 73 year old male, he had a recent admission to the hospital, admitted on the seventh and discharged on the 11th, the patient has a history for atrial fibrillation on chronic anticoagulation, he has a history of gout, GI bleeding stage IV lung cancer.  He also has a history of heavy alcohol use.  The patient is coming to the hospital with black stools, he had also been vomiting blood.  The patient underwent an EGD and was transferred to [facility] for interventional radiology embolization, his hemoglobin was stable and he was eventually discharged home off of the Eliquis  for now.  The patient arrived home 2 days ago, today the patient was feeling extremely dizzy lightheaded and was having difficulty maintaining his balance, near syncopal.  The patient had no nausea or vomiting, he states he feels extremely weak and noticed that his heart rate was high.  The patient's last drink of alcohol was approximately 8 August, he has known lymphedema.  Denies missing any of his medications.    Prior to Admission medications   Medication Sig Start Date End Date Taking? Authorizing Provider  acetaminophen  (TYLENOL ) 500 MG tablet Take 500 mg by mouth every 6 (six) hours as needed for mild pain.   Yes [provider]  allopurinol  (ZYLOPRIM ) 100 MG tablet Take 100 mg by mouth daily. 05/25/22  Yes [provider]  apixaban  (ELIQUIS ) 5 MG TABS tablet Take 1 tablet (5 mg total) by mouth 2 (two) times daily. Okay to restart this medication on 08/29/2023 08/28/23  Yes Shelah Lamar RAMAN, MD  cholecalciferol  (VITAMIN D3) 25 MCG (1000 UNIT) tablet Take 1,000 Units by mouth daily.   Yes [provider]  cyanocobalamin  (VITAMIN B12) 100  MCG tablet Take 100 mcg by mouth daily.   Yes [provider]  folic acid  (FOLVITE ) 1 MG tablet Take 1 tablet (1 mg total) by mouth daily. 09/12/23  Yes Rourk, Lamar HERO, MD  furosemide  (LASIX ) 40 MG tablet Take 20 mg by mouth daily.   Yes [provider]  HYDROcodone  bit-homatropine (HYCODAN) 5-1.5 MG/5ML syrup Take 5 mLs by mouth every 6 (six) hours as needed for cough. 07/03/23  Yes Meade Verdon RAMAN, MD  levalbuterol  (XOPENEX  HFA) 45 MCG/ACT inhaler Inhale 1 puff into the lungs every 4 (four) hours as needed for wheezing. 07/26/23 07/25/24 Yes Meade Verdon RAMAN, MD  loperamide (IMODIUM A-D) 2 MG tablet Take 2-4 mg by mouth 4 (four) times daily as needed for diarrhea or loose stools.   Yes [provider]  metoprolol  tartrate (LOPRESSOR ) 25 MG tablet Take 1 tablet (25 mg total) by mouth 2 (two) times daily. 11/17/21  Yes Strader, [place] M, PA-C  ondansetron  (ZOFRAN ) 8 MG tablet Take 1 tablet (8 mg total) by mouth every 8 (eight) hours as needed (As needed for nausea starting 3 days after chemo). 07/07/23  Yes Cloretta Arley NOVAK, MD  pantoprazole  (PROTONIX ) 40 MG tablet Take 1 tablet (40 mg total) by mouth 2 (two) times daily before a meal. 09/11/23 12/10/23 Yes Rojelio Nest, DO  prochlorperazine  (COMPAZINE ) 10 MG tablet Take 1 tablet (10 mg total) by mouth every 6 (six) hours as needed for nausea or vomiting. 07/07/23  Yes Cloretta Arley NOVAK, MD  simethicone  (MYLICON) 125 MG chewable tablet Chew 125 mg by mouth every 6 (six) hours as needed for flatulence.   Yes [provider]  thiamine  (VITAMIN B-1) 100 MG tablet Take 100 mg by mouth daily.   Yes [provider]    Allergies: Patient has no known allergies.    Review of Systems  Cardiovascular:  Positive for near-syncope.  Neurological:  Positive for dizziness.  All other systems reviewed and are negative.   Updated Vital Signs BP 104/65   Pulse 86   Temp (!) 97.4 F (36.3 C) (Oral)   Resp 13   Ht 1.829 m  (6')   Wt 119.3 kg   SpO2 97%   BMI 35.67 kg/m   Physical Exam Vitals and nursing note reviewed.  Constitutional:      General: He is not in acute distress.    Appearance: He is well-developed.  HENT:     Head: Normocephalic and atraumatic.     Mouth/Throat:     Pharynx: No oropharyngeal exudate.  Eyes:     General: No scleral icterus.       Right eye: No discharge.        Left eye: No discharge.     Conjunctiva/sclera: Conjunctivae normal.     Pupils: Pupils are equal, round, and reactive to light.  Neck:     Thyroid : No thyromegaly.     Vascular: No JVD.  Cardiovascular:     Rate and Rhythm: Tachycardia present. Rhythm irregular.     Heart sounds: Normal heart sounds. No murmur heard.    No friction rub. No gallop.     Comments: Heart rate ranges between 95 and 120, atrial fibrillation Pulmonary:     Effort: Pulmonary effort is normal. No respiratory distress.     Breath sounds: Normal breath sounds. No wheezing or rales.  Abdominal:     General: Bowel sounds are normal. There is no distension.     Palpations: Abdomen is soft. There is no mass.     Tenderness: There is no abdominal tenderness.  Musculoskeletal:        General: No tenderness. Normal range of motion.     Cervical back: Normal range of motion and neck supple.     Right lower leg: Edema present.     Left lower leg: Edema present.     Comments: Symmetrical lymphedema of the lower extremities without significant erythema or induration or warmth  Lymphadenopathy:     Cervical: No cervical adenopathy.  Skin:    General: Skin is warm and dry.     Findings: No erythema or rash.  Neurological:     Mental Status: He is alert.     Coordination: Coordination normal.  Psychiatric:        Behavior: Behavior normal.     (all labs ordered are listed, but only abnormal results are displayed) Labs Reviewed  CBC WITH DIFFERENTIAL/PLATELET - Abnormal; Notable for the following components:      Result Value    WBC 3.8 (*)    RBC 3.62 (*)    Hemoglobin 11.1 (*)    HCT 33.0 (*)    RDW 19.9 (*)    All other components within normal limits  COMPREHENSIVE METABOLIC PANEL WITH GFR - Abnormal; Notable for the following components:   Sodium 134 (*)    Potassium 3.2 (*)    CO2 20 (*)    Glucose, Bld 145 (*)    Calcium   8.5 (*)    Total Protein 6.0 (*)    Albumin 2.9 (*)    All other components within normal limits  PROTIME-INR  TYPE AND SCREEN  TROPONIN I (HIGH SENSITIVITY)    EKG: None  Radiology: [facility] Chest Port 1 View Result Date: 09/13/2023 CLINICAL DATA:  weakness EXAM: PORTABLE CHEST - 1 VIEW COMPARISON:  September 07, 2023 FINDINGS: Central pulmonary vascular congestion. Small left pleural effusion, unchanged. No pneumothorax. Mild cardiomegaly. The left hilar mass on the prior chest CT is less conspicuous by portable radiography. Tortuous aorta with aortic atherosclerosis. No acute fracture or destructive lesions. Multilevel thoracic osteophytosis. IMPRESSION: 1. Cardiomegaly with central pulmonary vascular congestion. Unchanged small left pleural effusion. 2. The left hilar mass on the prior chest CT is less conspicuous by portable radiography. Electronically Signed   By: Rogelia Myers M.D.   On: 09/13/2023 20:58     Procedures   Medications Ordered in the ED  0.9 %  sodium chloride  infusion ( Intravenous New Bag/Given 09/13/23 2052)  metoprolol  tartrate (LOPRESSOR ) injection 5 mg (5 mg Intravenous Given 09/13/23 2052)                                    Medical Decision Making Amount and/or Complexity of Data Reviewed Labs: ordered. Radiology: ordered. ECG/medicine tests: ordered.  Risk Prescription drug management.    This patient presents to the ED for concern of lightheadedness and dizziness known anemia, known GI bleed, known atrial fibrillation off of Eliquis , this involves an extensive number of treatment options, and is a complaint that carries with it a high risk of  complications and morbidity.  The differential diagnosis includes ongoing A-fib, could be related to worsening anemia, could be related to dehydration, unlikely to be sepsis   Co morbidities / Chronic conditions that complicate the patient evaluation  Known A-fib, off of Eliquis , GI bleed, heavy alcohol use   Additional history obtained:  Additional history obtained from EMR External records from outside source obtained and reviewed including medical record including admission and discharge summaries, EGD notes   Lab Tests:  I Ordered, and personally interpreted labs.  The pertinent results include: CBC with a rising hemoglobin, thankfully it is not dropping but is up to 11.1 today.  Potassium was slightly low at 3.2, no leukocytosis, no renal failure, no uremia, normal glycemic, INR of 1.0, troponin of 6   Imaging Studies ordered:  I ordered imaging studies including chest x-ray without any severe acute findings, just some vascular congestion and an unchanged left pleural effusion  I agree with the radiologist interpretation   Cardiac Monitoring: / EKG:  The patient was maintained on a cardiac monitor.  I personally viewed and interpreted the cardiac monitored which showed an underlying rhythm of: Atrial fibrillation, rate controlled in the 80s for the most part   Problem List / ED Course / Critical interventions / Medication management  The patient appears well for the majority of his stay, he is asymptomatic at the time of discharge and states that he feels better.  He has no chest pain or shortness of breath, no weakness, his vital signs are unremarkable I ordered medication including IV fluids, single dose of IV metoprolol  Reevaluation of the patient after these medicines showed that the patient improved and in fact resolved I have reviewed the patients home medicines and have made adjustments as needed   Social Determinants of Health:  Patient appears well, no further  evidence of GI bleeding in fact it looks like he is doing much better.  His vital signs have normalized, heart rate is down below 90 and blood pressure is normal   Test / Admission - Considered:  Considered admission but the patient has improved in fact resolved the symptoms, he is agreeable to return should symptoms worsen appear stable for discharge.  The exact cause of the dizziness that he had is not exactly clear but have not seen any pathologic causes here.      Final diagnoses:  Dizziness    ED Discharge Orders     None          Cleotilde Rogue, MD 09/13/23 2221

## 2023-09-13 NOTE — ED Triage Notes (Signed)
 Pt reports he was just released from the hospital for internal bleeding and clot removal from his stomach.  Pt reports he has been off of his blood thinners because of the procedure and is feeling very dizzy and like he wants to pass out.

## 2023-09-14 ENCOUNTER — Ambulatory Visit

## 2023-09-14 VITALS — BP 107/70 | HR 80 | Temp 97.4°F | Ht 72.0 in | Wt 260.4 lb

## 2023-09-14 DIAGNOSIS — J90 Pleural effusion, not elsewhere classified: Secondary | ICD-10-CM

## 2023-09-14 DIAGNOSIS — C799 Secondary malignant neoplasm of unspecified site: Secondary | ICD-10-CM | POA: Diagnosis not present

## 2023-09-14 DIAGNOSIS — Z87891 Personal history of nicotine dependence: Secondary | ICD-10-CM

## 2023-09-14 DIAGNOSIS — R042 Hemoptysis: Secondary | ICD-10-CM

## 2023-09-14 DIAGNOSIS — C3492 Malignant neoplasm of unspecified part of left bronchus or lung: Secondary | ICD-10-CM

## 2023-09-14 DIAGNOSIS — J449 Chronic obstructive pulmonary disease, unspecified: Secondary | ICD-10-CM

## 2023-09-14 DIAGNOSIS — R062 Wheezing: Secondary | ICD-10-CM

## 2023-09-14 DIAGNOSIS — K264 Chronic or unspecified duodenal ulcer with hemorrhage: Secondary | ICD-10-CM

## 2023-09-14 MED ORDER — BREZTRI AEROSPHERE 160-9-4.8 MCG/ACT IN AERO
2.0000 | INHALATION_SPRAY | Freq: Two times a day (BID) | RESPIRATORY_TRACT | Status: DC
Start: 2023-09-14 — End: 2023-09-14

## 2023-09-14 MED ORDER — PANTOPRAZOLE SODIUM 40 MG PO TBEC: 40.0000 mg | DELAYED_RELEASE_TABLET | Freq: Two times a day (BID) | ORAL | 1 refills | Status: DC

## 2023-09-14 MED ORDER — BREZTRI AEROSPHERE 160-9-4.8 MCG/ACT IN AERO: 2.0000 | INHALATION_SPRAY | Freq: Two times a day (BID) | RESPIRATORY_TRACT | 7 refills | Status: DC

## 2023-09-14 MED ORDER — BREZTRI AEROSPHERE 160-9-4.8 MCG/ACT IN AERO: 2.0000 | INHALATION_SPRAY | Freq: Two times a day (BID) | RESPIRATORY_TRACT | Status: DC

## 2023-09-14 NOTE — Progress Notes (Signed)
 Subjective:   PATIENT ID: Raymond Hurst Agent GENDER: male DOB: [birth year], MRN: [id number]   HPI 72 year old male with a past medical history of stage IV lung adenocarcinoma with a recurrent left pleural effusion (negative cytology), atrial fibrillation on Eliquis , recent GI bleed, history of alcohol use.   Patient states that he completed a course of chemotherapy (Taxol /carboplatin ) as well as radiation to the chest and adrenal glands.  More recently he underwent navigational bronchoscopy with biopsy to obtain more tissue for PD-L1 testing.  This has resulted and a 20% expression.  He was recently admitted to the hospital for GI bleed and underwent embolization on 09/09/2023.  He presented to the hospital again yesterday due to lightheadedness and was given IV fluids.  His hemoglobin was stable at that time.  He is supposed to follow-up with GI for his GI bleed.  Currently his Eliquis  is on hold.  Today he complains of an ongoing raspy voice.  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  He also reports some chest congestion and ongoing shortness of breath on exertion.  I reviewed his imaging and his left pleural effusion appears stable.   Past Medical History:  Diagnosis Date   Adrenal tumor    right adrenal gland tumor favored to be a myelolipoma   Atrial fibrillation (HCC)    Cellulitis of right lower leg    Dysrhythmia    A-fib   History of TIA (transient ischemic attack)    May 2020   Lymphedema    OSA on CPAP    severe obstructive sleep apnea with an AHI of 54/h with no significant central events.  He had nocturnal hypoxemia with O2 sat nadir of 70%. Now on CPAP   Pulmonary mass    Stroke [facility])      Family History  Problem Relation Age of Onset   Heart failure Mother    COPD Mother    Atrial fibrillation Mother    Idiopathic pulmonary fibrosis Father      Social History    Socioeconomic History   Marital status: Married    Spouse name: Not on file   Number of children: Not on file   Years of education: Not on file   Highest education level: Not on file  Occupational History   Not on file  Tobacco Use   Smoking status: Former    Current packs/day: 0.00    Average packs/day: 2.0 packs/day for 20.0 years (40.0 ttl pk-yrs)    Types: Cigarettes    Start date: 01/31/1970    Quit date: 01/31/1990    Years since quitting: 33.6   Smokeless tobacco: Never  Vaping Use   Vaping status: Never Used  Substance and Sexual Activity   Alcohol use: Not Currently    Comment: 4-5 beer daily   Drug use: No   Sexual activity: Not on file  Other Topics Concern   Not on file  Social History Narrative   Right handed.    Social Drivers of Corporate investment banker Strain: High Risk (06/15/2023)   Overall Financial Resource Strain (CARDIA)    Difficulty of Paying Living Expenses: Very hard  Food Insecurity: No Food Insecurity (09/07/2023)   Hunger Vital Sign    Worried About Running Out of Food in the Last Year: Never true    Ran Out of Food in the Last Year: Never  true  Recent Concern: Food Insecurity - Food Insecurity Present (06/15/2023)   Hunger Vital Sign    Worried About Running Out of Food in the Last Year: Sometimes true    Ran Out of Food in the Last Year: Sometimes true  Transportation Needs: No Transportation Needs (09/07/2023)   PRAPARE - Administrator, Civil Service (Medical): No    Lack of Transportation (Non-Medical): No  Physical Activity: Inactive (06/15/2023)   Exercise Vital Sign    Days of Exercise per Week: 0 days    Minutes of Exercise per Session: 0 min  Stress: No Stress Concern Present (06/15/2023)   [employer] of Occupational Health - Occupational Stress Questionnaire    Feeling of Stress : Only a little  Social Connections: Moderately Integrated (09/07/2023)   Social Connection and Isolation Panel    Frequency of  Communication with Friends and Family: More than three times a week    Frequency of Social Gatherings with Friends and Family: More than three times a week    Attends Religious Services: Patient declined    Database administrator or Organizations: Yes    Attends Engineer, structural: More than 4 times per year    Marital Status: Married  Catering manager Violence: Not At Risk (09/07/2023)   Humiliation, Afraid, Rape, and Kick questionnaire    Fear of Current or Ex-Partner: No    Emotionally Abused: No    Physically Abused: No    Sexually Abused: No     No Known Allergies   Outpatient Medications Prior to Visit  Medication Sig Dispense Refill   acetaminophen  (TYLENOL ) 500 MG tablet Take 500 mg by mouth every 6 (six) hours as needed for mild pain.     allopurinol  (ZYLOPRIM ) 100 MG tablet Take 100 mg by mouth daily.     apixaban  (ELIQUIS ) 5 MG TABS tablet Take 1 tablet (5 mg total) by mouth 2 (two) times daily. Okay to restart this medication on 08/29/2023     cholecalciferol  (VITAMIN D3) 25 MCG (1000 UNIT) tablet Take 1,000 Units by mouth daily.     cyanocobalamin  (VITAMIN B12) 100 MCG tablet Take 100 mcg by mouth daily.     furosemide  (LASIX ) 40 MG tablet Take 20 mg by mouth daily.     HYDROcodone  bit-homatropine (HYCODAN) 5-1.5 MG/5ML syrup Take 5 mLs by mouth every 6 (six) hours as needed for cough. 240 mL 0   levalbuterol  (XOPENEX  HFA) 45 MCG/ACT inhaler Inhale 1 puff into the lungs every 4 (four) hours as needed for wheezing. 15 each 2   metoprolol  tartrate (LOPRESSOR ) 25 MG tablet Take 1 tablet (25 mg total) by mouth 2 (two) times daily. 180 tablet 3   pantoprazole  (PROTONIX ) 40 MG tablet Take 1 tablet (40 mg total) by mouth 2 (two) times daily before a meal. 180 tablet 0   thiamine  (VITAMIN B-1) 100 MG tablet Take 100 mg by mouth daily.     folic acid  (FOLVITE ) 1 MG tablet Take 1 tablet (1 mg total) by mouth daily. (Patient not taking: Reported on 09/14/2023) 90 tablet 0    loperamide (IMODIUM A-D) 2 MG tablet Take 2-4 mg by mouth 4 (four) times daily as needed for diarrhea or loose stools.     ondansetron  (ZOFRAN ) 8 MG tablet Take 1 tablet (8 mg total) by mouth every 8 (eight) hours as needed (As needed for nausea starting 3 days after chemo). (Patient not taking: Reported on 09/14/2023) 20 tablet 0  prochlorperazine  (COMPAZINE ) 10 MG tablet Take 1 tablet (10 mg total) by mouth every 6 (six) hours as needed for nausea or vomiting. (Patient not taking: Reported on 09/14/2023) 30 tablet 0   simethicone  (MYLICON) 125 MG chewable tablet Chew 125 mg by mouth every 6 (six) hours as needed for flatulence.     No facility-administered medications prior to visit.    ROS Reviewed all systems and reported negative except as above     Objective:   Vitals:   09/14/23 1518  BP: 107/70  Pulse: 80  Temp: (!) 97.4 F (36.3 C)  TempSrc: Oral  SpO2: 98%  Weight: 260 lb 6.4 oz (118.1 kg)  Height: 6' (1.829 m)    Physical Exam General: Elderly male, ill-appearing not in acute distress Chest: Diffuse wheezing bilaterally Heart: Irregularly irregular rhythm, normal S1, normal S2 Extremities: Minimal edema   CBC    Component Value Date/Time   WBC 3.8 (L) 09/13/2023 2005   RBC 3.62 (L) 09/13/2023 2005   HGB 11.1 (L) 09/13/2023 2005   HGB 10.7 (L) 08/31/2023 1634   HGB 11.4 (L) 06/01/2023 1439   HCT 33.0 (L) 09/13/2023 2005   HCT 36.9 (L) 06/01/2023 1439   PLT 275 09/13/2023 2005   PLT 157 08/31/2023 1634   PLT 231 06/01/2023 1439   MCV 91.2 09/13/2023 2005   MCV 84 06/01/2023 1439   MCH 30.7 09/13/2023 2005   MCHC 33.6 09/13/2023 2005   RDW 19.9 (H) 09/13/2023 2005   RDW 16.8 (H) 06/01/2023 1439   LYMPHSABS 0.8 09/13/2023 2005   LYMPHSABS 1.0 06/01/2023 1439   MONOABS 0.3 09/13/2023 2005   EOSABS 0.0 09/13/2023 2005   EOSABS 0.2 06/01/2023 1439   BASOSABS 0.0 09/13/2023 2005   BASOSABS 0.0 06/01/2023 1439     Chest imaging: I reviewed his chest  x-ray performed yesterday in the ER.  His left pleural effusion appears stable in size I reviewed his chest CT performed on 09/02/2023 Small left pleural effusion, interval decrease in size of hilar mass on the left   PFT:    Latest Ref Rng & Units 04/17/2020   11:56 AM  PFT Results  FVC-Pre L 3.97   FVC-Predicted Pre % 84   FVC-Post L 3.91   FVC-Predicted Post % 83   Pre FEV1/FVC % % 76   Post FEV1/FCV % % 82   FEV1-Pre L 3.03   FEV1-Predicted Pre % 87   FEV1-Post L 3.19   DLCO uncorrected ml/min/mmHg 25.32   DLCO UNC% % 93   DLCO corrected ml/min/mmHg 25.32   DLCO COR %Predicted % 93   DLVA Predicted % 106   TLC L 6.21   TLC % Predicted % 85   RV % Predicted % 94           Assessment & Plan:   Assessment & Plan Primary adenocarcinoma of left lung [facility]) Following with oncology.  Metastatic cancer with primary lung.  Recent biopsy with resolution of postprocedure suspected pneumothorax.  PD-L1 testing performed and shows 20% expression. Recurrent left pleural effusion Pleural effusion appears stable on imaging.  Patient advised to call the office if he has worsening shortness of breath to assess pleural effusion.  It does not appear that his pleural effusion is quickly recurring to a level where he needs a Pleurx catheter Obstructive lung disease (generalized) (HCC) Patient is wheezing on today's exam.  PFTs reviewed in 2022 which show mild obstruction.  Patient was previously on Breztri  however he self  discontinued the medication.  He was advised to resume Breztri  for his wheezing.  Performed oxygen trending on ambulation and the patient did not drop his oxygen levels at all.  He does not qualify for oxygen. Hemoptysis Patient has not noted any further hemoptysis recently.  His Eliquis  is currently on hold due to GI bleeding           Zola Herter, MD [facility] Office: [phone]

## 2023-09-14 NOTE — Patient Instructions (Addendum)
-  You were seen in the pulmonary clinic  -Your chest xray form the ER yesterday shows that your pleural effusion (the pocket of fluid around your left lung) appears small at this time and does not need drainage.  If you notice more shortness of breath then please reach out to the clinic so that we can reevaluate your pleural effusion if it needs to be drained  -We checked your oxygen in the office while you are sitting and while you are walking and it did not drop so you do not need oxygen  - Your biopsy results are back.  You can discuss with your oncologist further treatment options.  Your PD-L1 expression which is normal nuclear test we sent came back at 20%  - In the office when I listen to your lungs you were wheezing.  I advised you to continue taking breast tree twice a day a prescription was sent to the pharmacy.  You were also given a sample to try it out

## 2023-09-15 ENCOUNTER — Other Ambulatory Visit: Payer: Self-pay

## 2023-09-19 ENCOUNTER — Telehealth: Payer: Self-pay | Admitting: Radiation Oncology

## 2023-09-19 NOTE — Telephone Encounter (Signed)
 8/19 Called patient's wife after received voicemail, apparently they have received a mychart message for no show appt on 8/18.  Patient was not sch on that date.

## 2023-09-20 DIAGNOSIS — F1020 Alcohol dependence, uncomplicated: Secondary | ICD-10-CM | POA: Diagnosis not present

## 2023-09-20 DIAGNOSIS — Z6837 Body mass index (BMI) 37.0-37.9, adult: Secondary | ICD-10-CM | POA: Diagnosis not present

## 2023-09-20 DIAGNOSIS — D6869 Other thrombophilia: Secondary | ICD-10-CM | POA: Diagnosis not present

## 2023-09-20 DIAGNOSIS — C3412 Malignant neoplasm of upper lobe, left bronchus or lung: Secondary | ICD-10-CM | POA: Diagnosis not present

## 2023-09-20 DIAGNOSIS — I4891 Unspecified atrial fibrillation: Secondary | ICD-10-CM | POA: Diagnosis not present

## 2023-09-20 DIAGNOSIS — Z8719 Personal history of other diseases of the digestive system: Secondary | ICD-10-CM | POA: Diagnosis not present

## 2023-09-20 DIAGNOSIS — D62 Acute posthemorrhagic anemia: Secondary | ICD-10-CM | POA: Diagnosis not present

## 2023-09-22 ENCOUNTER — Telehealth: Payer: Self-pay | Admitting: *Deleted

## 2023-09-22 ENCOUNTER — Encounter: Payer: Self-pay | Admitting: Nurse Practitioner

## 2023-09-22 ENCOUNTER — Other Ambulatory Visit

## 2023-09-22 ENCOUNTER — Inpatient Hospital Stay (HOSPITAL_BASED_OUTPATIENT_CLINIC_OR_DEPARTMENT_OTHER): Admitting: Nurse Practitioner

## 2023-09-22 VITALS — BP 113/72 | HR 93 | Temp 97.8°F | Resp 18 | Ht 72.0 in | Wt 268.0 lb

## 2023-09-22 DIAGNOSIS — C7972 Secondary malignant neoplasm of left adrenal gland: Secondary | ICD-10-CM | POA: Insufficient documentation

## 2023-09-22 DIAGNOSIS — J90 Pleural effusion, not elsewhere classified: Secondary | ICD-10-CM | POA: Insufficient documentation

## 2023-09-22 DIAGNOSIS — K264 Chronic or unspecified duodenal ulcer with hemorrhage: Secondary | ICD-10-CM | POA: Diagnosis not present

## 2023-09-22 DIAGNOSIS — C801 Malignant (primary) neoplasm, unspecified: Secondary | ICD-10-CM | POA: Diagnosis not present

## 2023-09-22 DIAGNOSIS — C3412 Malignant neoplasm of upper lobe, left bronchus or lung: Secondary | ICD-10-CM | POA: Insufficient documentation

## 2023-09-22 LAB — CMP (CANCER CENTER ONLY)
ALT: 6 U/L (ref 0–44)
AST: 12 U/L — ABNORMAL LOW (ref 15–41)
Albumin: 3.2 g/dL — ABNORMAL LOW (ref 3.5–5.0)
Alkaline Phosphatase: 67 U/L (ref 38–126)
Anion gap: 9 (ref 5–15)
BUN: 8 mg/dL (ref 8–23)
CO2: 24 mmol/L (ref 22–32)
Calcium: 8.7 mg/dL — ABNORMAL LOW (ref 8.9–10.3)
Chloride: 103 mmol/L (ref 98–111)
Creatinine: 0.75 mg/dL (ref 0.61–1.24)
GFR, Estimated: >60 mL/min (ref >60)
Glucose, Bld: 97 mg/dL (ref 70–99)
Potassium: 3.8 mmol/L (ref 3.5–5.1)
Sodium: 135 mmol/L (ref 135–145)
Total Bilirubin: 0.4 mg/dL (ref 0.0–1.2)
Total Protein: 5.9 g/dL — ABNORMAL LOW (ref 6.5–8.1)

## 2023-09-22 LAB — CBC WITH DIFFERENTIAL (CANCER CENTER ONLY)
Abs Immature Granulocytes: 0.01 K/uL (ref 0.00–0.07)
Basophils Absolute: 0 K/uL (ref 0.0–0.1)
Basophils Relative: 1 %
Eosinophils Absolute: 0.1 K/uL (ref 0.0–0.5)
Eosinophils Relative: 2 %
HCT: 30.7 % — ABNORMAL LOW (ref 39.0–52.0)
Hemoglobin: 9.8 g/dL — ABNORMAL LOW (ref 13.0–17.0)
Immature Granulocytes: 0 %
Lymphocytes Relative: 6 %
Lymphs Abs: 0.2 K/uL — ABNORMAL LOW (ref 0.7–4.0)
MCH: 30.2 pg (ref 26.0–34.0)
MCHC: 31.9 g/dL (ref 30.0–36.0)
MCV: 94.8 fL (ref 80.0–100.0)
Monocytes Absolute: 0.4 K/uL (ref 0.1–1.0)
Monocytes Relative: 9 %
Neutro Abs: 3.4 K/uL (ref 1.7–7.7)
Neutrophils Relative %: 82 %
Platelet Count: 209 K/uL (ref 150–400)
RBC: 3.24 MIL/uL — ABNORMAL LOW (ref 4.22–5.81)
RDW: 19.8 % — ABNORMAL HIGH (ref 11.5–15.5)
WBC Count: 4.1 K/uL (ref 4.0–10.5)
nRBC: 0 % (ref 0.0–0.2)

## 2023-09-22 LAB — SAMPLE TO BLOOD BANK

## 2023-09-22 NOTE — Telephone Encounter (Signed)
 Called Raymond Hurst with thoracentesis appointment at [facility] on 8/26 at 2:30 pm. Check in at radiology on 2 pm. OK to eat/take eliquis .

## 2023-09-22 NOTE — Progress Notes (Signed)
 [facility] OFFICE PROGRESS NOTE   Diagnosis: Non-small cell lung cancer  INTERVAL HISTORY:   Raymond Hurst returns for follow-up.  He was hospitalized 09/07/2023 through 09/11/2023 with a GI bleed.  He is feeling better.  No further bleeding.  He continues to note wheezing, shortness of breath.  Persistent hoarseness.  Leg swelling increased during the recent hospitalization.  Objective:  Vital signs in last 24 hours:  Blood pressure 113/72, pulse 93, temperature 97.8 F (36.6 C), temperature source Temporal, resp. rate 18, height 6' (1.829 m), weight 268 lb (121.6 kg), SpO2 100%.    HEENT: No thrush or ulcers. Resp: Scattered wheezes.  No respiratory distress. Cardio: Irregular. GI: No hepatosplenomegaly.  Nontender. Vascular: Bilateral lower leg edema.   Lab Results:  Lab Results  Component Value Date   WBC 4.1 09/22/2023   HGB 9.8 (L) 09/22/2023   HCT 30.7 (L) 09/22/2023   MCV 94.8 09/22/2023   PLT 209 09/22/2023   NEUTROABS 3.4 09/22/2023    Imaging:  No results found.  Medications: I have reviewed the patient's current medications.  Assessment/Plan: Left lung mass CT chest 06/05/2023: Left upper lobe mass with invasion of the mediastinum and effacement of the left upper lobe and lower lobe pulmonary arteries, moderate left pleural effusion, small mediastinal lymph nodes, right adrenal mass favoring myolipoma, probable benign lucent lesion at the right scapula Diagnostic left thoracentesis 06/06/2023: Cytology-no malignant cells identified Bronchoscopy with endobronchial ultrasound and biopsies 06/12/2023-lateral left mainstem bronchial irregularity extending into the left upper lobe airways narrowing the left upper lobe airways, no discrete endobronchial lesion.  Endobronchial ultrasound showed a left hilar mass with significant vascularity; enlargement at station 4L, 11L, 7.  Station 7, 4L and 11L lymph nodes negative for malignancy; left hilar mass/lymph  node metastatic carcinoma; left upper lobe endobronchial brushing malignant cells present; left upper lobe endobronchial biopsy suspicious for malignancy; weak focal p63 positivity in the tumor cells, negative for TTF-1, synaptophysin and CD56, a poorly differentiated squamous cell carcinoma is favored; PD-L1 pending; foundation 1-HRD signature/microsatellite status/tumor mutation burden cannot be determined, IDH1 M880V. PET scan 06/20/2023-large left hilar mass primarily in the left upper lobe.  Left adrenal gland with hypermetabolic activity suspicious for early metastatic lesion. Brain MRI 06/30/2023-negative for metastatic disease Week 1 Taxol /carboplatin  07/10/2023 Radiation chest left adrenal metastasis 07/11/2023-09/01/2023 Week 2 Taxol /carboplatin  07/17/2023 Week 3 Taxol /carboplatin  07/24/2023 Week 4 Taxol /carboplatin  08/01/2023 Week 5 Taxol /carboplatin  08/08/2023 Week 6 Taxol /carboplatin  08/14/2023 Week 7 Taxol /carboplatin  08/23/2023 Bronchoscopy 08/28/2023 with biopsy of the left upper lobe mass-non-small cell carcinoma-adenocarcinoma favored; PD-L1 tumor proportion score 20%   2.  Hoarseness/cough secondary to #1-improved following vocal cord procedure 06/29/2023, Restylane injection into bilateral lateral thyroid  arytenoid muscles 07/31/2023 3.  Atrial fibrillation 4.  Lower extremity edema 5.  Alcohol use 6.  Large external hemorrhoid on exam 07/17/2023 7.  Admission 09/07/2023 with acute upper GI bleeding 09/09/2023 EGD-duodenal ulcer with a visible vessel treated with epinephrine  09/09/2023-IR coil embolization of the GDA  Disposition: Raymond Hurst appears stable.  We reviewed the PD-L1 tumor proportion score of 20% with him and his wife at today's visit.  They understand and agree with Dr. Andriette recommendation for Durvalumab.  We reviewed potential side effects associated with Durvalumab including an allergic reaction, rash, diarrhea, hepatitis, myocarditis, endocrinopathies, neurologic toxicities,  ocular toxicity, pneumonitis, nephrotoxicity, brain inflammation.  He was provided with printed information.  He would like to proceed.  He has a persistent left pleural effusion.  We are referring him for a  diagnostic/therapeutic thoracentesis.  He will return for cycle 1 Durvalumab next week.  We will see him in follow-up prior to cycle 2.  Patient seen with Dr. Cloretta.   Olam Ned ANP/GNP-BC   09/22/2023  2:19 PM  This was a shared visit with Olam Ned.  Raymond Hurst completed the course of concurrent paclitaxel /carboplatin  and radiation.  The PD-L1 returned at 20% on the repeat lung biopsy.  I recommend adjuvant durvalumab.  We reviewed potential toxicities associated with durvalumab.  He agrees to proceed.  We reviewed the most recent CT images with Raymond Hurst.  He has persistent wheezing, potentially related to remaining tumor in the left lung and a pleural effusion.  He will be referred for a diagnostic/therapeutic thoracentesis.  The goals of Durvalumab are to potentially extend survival.  He has a small chance of undergoing curative therapy given the probable left adrenal metastasis at presentation.  A treatment plan was entered today.  I was present for greater than 50% of today's visit.  I performed medical decision making.  Arvella Cloretta, MD

## 2023-09-22 NOTE — Progress Notes (Signed)
 DISCONTINUE OFF PATHWAY REGIMEN - Non-Small Cell Lung   [id number]:$MzfnczAzqnmzIZPI_VBjUeiwvHhqIfyvppxCWdbTcWhqWsCOC$$MzfnczAzqnmzIZPI_VBjUeiwvHhqIfyvppxCWdbTcWhqWsCOC$  + Paclitaxel  (2/50) + RT weekly x 6 weeks:   Administer weekly during RT:     Paclitaxel       Carboplatin    **Always confirm dose/schedule in your pharmacy ordering system**  PRIOR TREATMENT: Off Pathway: Carboplatin  + Paclitaxel  (2/50) + RT weekly x 6 weeks  START OFF PATHWAY REGIMEN - Non-Small Cell Lung   [id number]:Durvalumab 1,500 mg IV D1 q28 Days:   A cycle is every 28 days:     Durvalumab   **Always confirm dose/schedule in your pharmacy ordering system**  Patient Characteristics: Preoperative or Nonsurgical Candidate (Clinical Staging), Distant Metastasis Therapeutic Status: Preoperative or Nonsurgical Candidate (Clinical Staging) AJCC T Category: cT4 AJCC N Category: cN2 AJCC M Category: cM1 AJCC 9 Stage Grouping: Unknown Check here if patient was staged using an edition other than AJCC Staging 9th Edition: false Intent of Therapy: Curative Intent, Discussed with Patient

## 2023-09-25 ENCOUNTER — Encounter: Payer: Self-pay | Admitting: Oncology

## 2023-09-25 ENCOUNTER — Telehealth: Payer: Self-pay | Admitting: Oncology

## 2023-09-25 ENCOUNTER — Telehealth: Payer: Self-pay

## 2023-09-25 NOTE — Telephone Encounter (Signed)
 Addendum --   CSW made contact with patients spouse. CSW explained reason for pest control assistance is no longer available for patient / spouse. Patient's spouse expressed disappointment with the decision, and inquired if this decision could be changed. CSW informed patient, this decision came from discussions with leadership.  Patient again inquired why assistance could not be provided, CSW informed patient the reason for the decision, one reason between the time of inquiry and assessment.   Patient's spouse expressed fears that the information provided by [employer] will reach a city agency and lead to further assessments.CSW informed patient, CSW does not report this information to any city agencies, as it does not meet APS standards, information is confidential. Patient expressed understanding, but wanted to be guaranteed [employer] will not report. CSW not able to do this. Patient is awaiting a call from Engineer, manufacturing systems to discuss further.

## 2023-09-25 NOTE — Telephone Encounter (Signed)
 Patient has been scheduled for follow-up visit per 09/22/23 LOS.  LVM notifying pt of appt details, provided my direct number to pt if appt changes need to be made.

## 2023-09-25 NOTE — Telephone Encounter (Signed)
 [facility] CSW Progress Note  Clinical Social Worker attempted to reach spouse by phone to discuss pest control assistance. After speaking with leadership patient / spouse are no longer eligible for assistance. Pest control company made several attempts to have service completed.   Follow Up Plan:  CSW will await patient / spouse to call back    Lizbeth Sprague, LCSW Clinical Social Worker [facility]

## 2023-09-26 ENCOUNTER — Other Ambulatory Visit: Payer: Self-pay

## 2023-09-26 ENCOUNTER — Ambulatory Visit (HOSPITAL_COMMUNITY)
Admission: RE | Admit: 2023-09-26 | Discharge: 2023-09-26 | Disposition: A | Discharge status: Home / Self Care | Source: Ambulatory Visit | Attending: Radiology | Admitting: Radiology

## 2023-09-26 ENCOUNTER — Ambulatory Visit (HOSPITAL_COMMUNITY)
Admission: RE | Admit: 2023-09-26 | Discharge: 2023-09-26 | Disposition: A | Discharge status: Home / Self Care | Source: Ambulatory Visit | Attending: Nurse Practitioner | Admitting: Nurse Practitioner

## 2023-09-26 DIAGNOSIS — J90 Pleural effusion, not elsewhere classified: Secondary | ICD-10-CM

## 2023-09-26 DIAGNOSIS — C801 Malignant (primary) neoplasm, unspecified: Secondary | ICD-10-CM | POA: Insufficient documentation

## 2023-09-26 DIAGNOSIS — I517 Cardiomegaly: Secondary | ICD-10-CM | POA: Diagnosis not present

## 2023-09-26 DIAGNOSIS — J910 Malignant pleural effusion: Secondary | ICD-10-CM | POA: Insufficient documentation

## 2023-09-26 DIAGNOSIS — Z48813 Encounter for surgical aftercare following surgery on the respiratory system: Secondary | ICD-10-CM | POA: Diagnosis not present

## 2023-09-26 DIAGNOSIS — C3412 Malignant neoplasm of upper lobe, left bronchus or lung: Secondary | ICD-10-CM | POA: Diagnosis not present

## 2023-09-26 DIAGNOSIS — C7972 Secondary malignant neoplasm of left adrenal gland: Secondary | ICD-10-CM | POA: Insufficient documentation

## 2023-09-26 HISTORY — PX: IR THORACENTESIS ASP PLEURAL SPACE W/IMG GUIDE: IMG5380

## 2023-09-26 MED ORDER — LIDOCAINE-EPINEPHRINE 1 %-1:100000 IJ SOLN
INTRAMUSCULAR | Status: AC
  Filled 2023-09-26: qty 1

## 2023-09-26 MED ORDER — LIDOCAINE-EPINEPHRINE 1 %-1:100000 IJ SOLN: 20.0000 mL | Freq: Once | INTRAMUSCULAR | Status: DC

## 2023-09-26 NOTE — Procedures (Addendum)
 Ultrasound-guided diagnostic and therapeutic left thoracentesis performed yielding 1 liter of hazy, amber colored fluid. No immediate complications. Follow-up chest x-ray pending. The fluid was submitted to the lab for cytology. EBL none.

## 2023-09-28 ENCOUNTER — Telehealth: Payer: Self-pay | Admitting: Oncology

## 2023-09-28 ENCOUNTER — Telehealth: Payer: Self-pay | Admitting: Nurse Practitioner

## 2023-09-28 ENCOUNTER — Encounter: Payer: Self-pay | Admitting: Oncology

## 2023-09-28 DIAGNOSIS — I890 Lymphedema, not elsewhere classified: Secondary | ICD-10-CM | POA: Diagnosis not present

## 2023-09-28 DIAGNOSIS — I83811 Varicose veins of right lower extremities with pain: Secondary | ICD-10-CM | POA: Diagnosis not present

## 2023-09-28 DIAGNOSIS — M7989 Other specified soft tissue disorders: Secondary | ICD-10-CM | POA: Diagnosis not present

## 2023-09-28 DIAGNOSIS — R600 Localized edema: Secondary | ICD-10-CM | POA: Diagnosis not present

## 2023-09-28 NOTE — Telephone Encounter (Signed)
 Returning PT phone call.

## 2023-09-28 NOTE — Telephone Encounter (Signed)
 Left message with PT's wife about Appt details for 8/29

## 2023-09-29 ENCOUNTER — Other Ambulatory Visit

## 2023-09-29 ENCOUNTER — Telehealth: Payer: Self-pay

## 2023-09-29 ENCOUNTER — Inpatient Hospital Stay

## 2023-09-29 ENCOUNTER — Telehealth: Payer: Self-pay | Admitting: *Deleted

## 2023-09-29 NOTE — Telephone Encounter (Signed)
 [facility] Clinical Social Work  Clinical Social Work was referred by medical provider for advance directives questions.  Clinical Social Worker attempted to contact patient by phone to offer support and assess for needs.   CSW left VM with information about Advance Directives.       Lizbeth Sprague, LCSW  Clinical Social Worker [facility]

## 2023-09-29 NOTE — Telephone Encounter (Signed)
 Left VM for [name] regarding no show today. Requested a return call to let us  know when to reschedule and if he got his swollen/hot leg assessed yesterday, or is he in the hospital now?

## 2023-10-03 ENCOUNTER — Ambulatory Visit
Admission: RE | Admit: 2023-10-03 | Discharge: 2023-10-03 | Disposition: A | Discharge status: Home / Self Care | Source: Ambulatory Visit | Attending: Radiation Oncology | Admitting: Radiation Oncology

## 2023-10-03 DIAGNOSIS — C801 Malignant (primary) neoplasm, unspecified: Secondary | ICD-10-CM | POA: Insufficient documentation

## 2023-10-03 DIAGNOSIS — C7972 Secondary malignant neoplasm of left adrenal gland: Secondary | ICD-10-CM | POA: Insufficient documentation

## 2023-10-03 LAB — CYTOLOGY - NON PAP

## 2023-10-03 NOTE — Progress Notes (Addendum)
  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   Symptoms of fatigue have improved since completing therapy.  Symptoms of skin changes have improved since completing therapy.  Symptoms of nausea or vomiting have improved since completing therapy.  Reports good appetite needs to drink more.  Reports some diarrhea is taking Imodium Ad as needed.  Complaints of wheezing/SOB uses 2 kinds of inhalers per wife.  Recently had 1 liter of fluid removed from left lung 09/26/2023  Has cough but denies any bloody sputum.  Advised patient to follow-up with Dr. Andriette office and Pulmonary doctor as soon as possible for coughing/wheezing/SOB symptoms.   The patient has scheduled follow up with his medical oncologist Dr. Arley Hof for ongoing surveillance, and was encouraged to call if he  develops concerns or questions regarding radiation.

## 2023-10-04 ENCOUNTER — Inpatient Hospital Stay

## 2023-10-04 ENCOUNTER — Ambulatory Visit: Payer: Self-pay

## 2023-10-04 ENCOUNTER — Inpatient Hospital Stay: Attending: Nurse Practitioner

## 2023-10-04 VITALS — BP 105/68 | HR 72 | Temp 98.2°F | Resp 18 | Wt 268.2 lb

## 2023-10-04 DIAGNOSIS — C3412 Malignant neoplasm of upper lobe, left bronchus or lung: Secondary | ICD-10-CM

## 2023-10-04 DIAGNOSIS — Z79620 Long term (current) use of immunosuppressive biologic: Secondary | ICD-10-CM | POA: Insufficient documentation

## 2023-10-04 DIAGNOSIS — Z5112 Encounter for antineoplastic immunotherapy: Secondary | ICD-10-CM | POA: Diagnosis not present

## 2023-10-04 DIAGNOSIS — J449 Chronic obstructive pulmonary disease, unspecified: Secondary | ICD-10-CM

## 2023-10-04 LAB — CMP (CANCER CENTER ONLY)
ALT: 8 U/L (ref 0–44)
AST: 16 U/L (ref 15–41)
Albumin: 3.4 g/dL — ABNORMAL LOW (ref 3.5–5.0)
Alkaline Phosphatase: 80 U/L (ref 38–126)
Anion gap: 12 (ref 5–15)
BUN: 10 mg/dL (ref 8–23)
CO2: 23 mmol/L (ref 22–32)
Calcium: 9.4 mg/dL (ref 8.9–10.3)
Chloride: 104 mmol/L (ref 98–111)
Creatinine: 0.88 mg/dL (ref 0.61–1.24)
GFR, Estimated: >60 mL/min (ref >60)
Glucose, Bld: 110 mg/dL — ABNORMAL HIGH (ref 70–99)
Potassium: 3.6 mmol/L (ref 3.5–5.1)
Sodium: 138 mmol/L (ref 135–145)
Total Bilirubin: 0.4 mg/dL (ref 0.0–1.2)
Total Protein: 6.3 g/dL — ABNORMAL LOW (ref 6.5–8.1)

## 2023-10-04 LAB — CBC WITH DIFFERENTIAL (CANCER CENTER ONLY)
Abs Immature Granulocytes: 0.01 K/uL (ref 0.00–0.07)
Basophils Absolute: 0 K/uL (ref 0.0–0.1)
Basophils Relative: 1 %
Eosinophils Absolute: 0.1 K/uL (ref 0.0–0.5)
Eosinophils Relative: 3 %
HCT: 32 % — ABNORMAL LOW (ref 39.0–52.0)
Hemoglobin: 10.4 g/dL — ABNORMAL LOW (ref 13.0–17.0)
Immature Granulocytes: 0 %
Lymphocytes Relative: 7 %
Lymphs Abs: 0.3 K/uL — ABNORMAL LOW (ref 0.7–4.0)
MCH: 31.2 pg (ref 26.0–34.0)
MCHC: 32.5 g/dL (ref 30.0–36.0)
MCV: 96.1 fL (ref 80.0–100.0)
Monocytes Absolute: 0.4 K/uL (ref 0.1–1.0)
Monocytes Relative: 8 %
Neutro Abs: 3.6 K/uL (ref 1.7–7.7)
Neutrophils Relative %: 81 %
Platelet Count: 212 K/uL (ref 150–400)
RBC: 3.33 MIL/uL — ABNORMAL LOW (ref 4.22–5.81)
RDW: 19.1 % — ABNORMAL HIGH (ref 11.5–15.5)
WBC Count: 4.5 K/uL (ref 4.0–10.5)
nRBC: 0 % (ref 0.0–0.2)

## 2023-10-04 LAB — TSH: TSH: 1.44 u[IU]/mL (ref 0.350–4.500)

## 2023-10-04 LAB — SAMPLE TO BLOOD BANK

## 2023-10-04 MED ORDER — SODIUM CHLORIDE 0.9 % IV SOLN: INTRAVENOUS | Status: DC

## 2023-10-04 MED ORDER — SODIUM CHLORIDE 0.9 % IV SOLN
1500.0000 mg | Freq: Once | INTRAVENOUS | Status: AC
  Administered 2023-10-04: 1500 mg via INTRAVENOUS
  Filled 2023-10-04: qty 30

## 2023-10-04 NOTE — Telephone Encounter (Signed)
 FYI Only or Action Required?: Action required by provider: Refusing ED at this time, requesting call back asap with further recommendations and/or meds.  Patient is followed in Pulmonology for lung Hurst, last seen on 09/14/2023 by [name], MD.  Called Nurse Triage reporting Shortness of Breath, Wheezing, 1L fluid taken off lungs last week, and Cough.  Symptoms began several days ago.  Interventions attempted: Rescue inhaler and Maintenance inhaler.  Symptoms are: rapidly worsening.  Triage Disposition: Go to ED Now (Notify PCP)  Patient/caregiver understands and will follow disposition?: No, refuses disposition      Copied from CRM #[id number]. Topic: Clinical - Red Word Triage >> Oct 04, 2023  2:05 PM Whitney O wrote: [facility] that prompted transfer to Nurse Triage: need to speak with nurse Dimitrios is having a terrible problem with wheezing and breathing  Raymond Hurst . And it has gotten worse within the last few days . Dr zaida in [place] Reason for Disposition  Wheezing can be heard across the room  Answer Assessment - Initial Assessment Questions [place] wife on phone  E2C2 Pulmonary Triage - Initial Assessment Questions Chief Complaint (e.g., cough, sob, wheezing, fever, chills, sweat or additional symptoms) *Go to specific symptom protocol after initial questions. Worse SOB and wheezing than usual over past few days Lot more coughing as well, Raymond says just saliva, no blood or anything Last week had fluid taken off of lungs, got 1L out No chest pain, dizziness, weakness, fever Can hear wheezing across the room Just seems miserable When starts to talk sounds like it did when before treatment, that's why concerning Wfie could hear wheezing loudly whole time talking on phone Taking a breath sooner than normal when talking even at rest Just went for first immune therapy today all day  How long have symptoms been present? Past few days  Have you used  your inhalers/maintenance medication? Yes If yes, What medications? Breztri  and rescue inhaler  If inhaler, ask How many puffs and how often? Note: Review instructions on medication in the chart. Not sure how often using rescue inhaler, small period of time relief  OXYGEN: Do you wear supplemental oxygen? No  Do you monitor your oxygen levels? No  6. CARDIAC HISTORY: Do you have any history of heart disease? (e.g., heart attack, angina, bypass surgery, angioplasty)      significant  7. LUNG HISTORY: Do you have any history of lung disease?  (e.g., pulmonary embolus, asthma, emphysema)     Lung Hurst   Can Raymond take an allergy pill or is Raymond candidate for prednisone  to make symptoms less   Advised pt go to ED, pt wife refusing at this time, requesting call back asap from pulm with further recommendations, sending message with request for call back as well as wife question about if allergy pill or prednisone  would be helpful for pt. Advised hospital for any worsening or new symptoms, consider hospital if not heard back from pulm by EOD today.  Protocols used: Breathing Difficulty-A-AH

## 2023-10-04 NOTE — Patient Instructions (Signed)
 [facility] - A DEPT OF . [facility]  Discharge Instructions: Thank you for choosing [facility] to provide your oncology and hematology care.   If you have a lab appointment with the Cancer Center, please go directly to the Cancer Center and check in at the registration area.   Wear comfortable clothing and clothing appropriate for easy access to any Portacath or PICC line.   We strive to give you quality time with your provider. You may need to reschedule your appointment if you arrive late (15 or more minutes).  Arriving late affects you and other patients whose appointments are after yours.  Also, if you miss three or more appointments without notifying the office, you may be dismissed from the clinic at the provider's discretion.      For prescription refill requests, have your pharmacy contact our office and allow 72 hours for refills to be completed.    Today you received the following chemotherapy and/or immunotherapy agents imfinzi .      To help prevent nausea and vomiting after your treatment, we encourage you to take your nausea medication as directed.  BELOW ARE SYMPTOMS THAT SHOULD BE REPORTED IMMEDIATELY: *FEVER GREATER THAN 100.4 F (38 C) OR HIGHER *CHILLS OR SWEATING *NAUSEA AND VOMITING THAT IS NOT CONTROLLED WITH YOUR NAUSEA MEDICATION *UNUSUAL SHORTNESS OF BREATH *UNUSUAL BRUISING OR BLEEDING *URINARY PROBLEMS (pain or burning when urinating, or frequent urination) *BOWEL PROBLEMS (unusual diarrhea, constipation, pain near the anus) TENDERNESS IN MOUTH AND THROAT WITH OR WITHOUT PRESENCE OF ULCERS (sore throat, sores in mouth, or a toothache) UNUSUAL RASH, SWELLING OR PAIN  UNUSUAL VAGINAL DISCHARGE OR ITCHING   Items with * indicate a potential emergency and should be followed up as soon as possible or go to the Emergency Department if any problems should occur.  Please show the CHEMOTHERAPY ALERT CARD or IMMUNOTHERAPY  ALERT CARD at check-in to the Emergency Department and triage nurse.  Should you have questions after your visit or need to cancel or reschedule your appointment, please contact [facility] - A DEPT OF [facility]  Dept: [phone]  and follow the prompts.  Office hours are 8:00 a.m. to 4:30 p.m. Monday - Friday. Please note that voicemails left after 4:00 p.m. may not be returned until the following business day.  We are closed weekends and major holidays. You have access to a nurse at all times for urgent questions. Please call the main number to the clinic Dept: [phone] and follow the prompts.   For any non-urgent questions, you may also contact your provider using MyChart. We now offer e-Visits for anyone 40 and older to request care online for non-urgent symptoms. For details visit [email or web address].   Also download the MyChart app! Go to the app store, search MyChart, open the app, select [place], and log in with your MyChart username and password.  Durvalumab  Injection What is this medication? DURVALUMAB  (dur VAL ue mab) treats some types of cancer. It works by helping your immune system slow or stop the spread of cancer cells. It is a monoclonal antibody. This medicine may be used for other purposes; ask your health care provider or pharmacist if you have questions. COMMON BRAND NAME(S): IMFINZI  What should I tell my care team before I take this medication? They need to know if you have any of these conditions: Allogeneic stem cell transplant (uses someone else's stem cells) Autoimmune diseases, such as Crohn  disease, ulcerative colitis, lupus History of chest radiation Nervous system problems, such as Guillain-Barre syndrome, myasthenia gravis Organ transplant An unusual or allergic reaction to durvalumab , other medications, foods, dyes, or preservatives Pregnant or trying to get pregnant Breast-feeding How should I use this  medication? This medication is infused into a vein. It is given by your care team in a hospital or clinic setting. A special MedGuide will be given to you before each treatment. Be sure to read this information carefully each time. Talk to your care team about the use of this medication in children. Special care may be needed. Overdosage: If you think you have taken too much of this medicine contact a poison control center or emergency room at once. NOTE: This medicine is only for you. Do not share this medicine with others. What if I miss a dose? Keep appointments for follow-up doses. It is important not to miss your dose. Call your care team if you are unable to keep an appointment. What may interact with this medication? Interactions have not been studied. This list may not describe all possible interactions. Give your health care provider a list of all the medicines, herbs, non-prescription drugs, or dietary supplements you use. Also tell them if you smoke, drink alcohol, or use illegal drugs. Some items may interact with your medicine. What should I watch for while using this medication? Your condition will be monitored carefully while you are receiving this medication. You may need blood work while taking this medication. This medication may cause serious skin reactions. They can happen weeks to months after starting the medication. Contact your care team right away if you notice fevers or flu-like symptoms with a rash. The rash may be red or purple and then turn into blisters or peeling of the skin. You may also notice a red rash with swelling of the face, lips, or lymph nodes in your neck or under your arms. Tell your care team right away if you have any change in your eyesight. Talk to your care team if you may be pregnant. Serious birth defects can occur if you take this medication during pregnancy and for 3 months after the last dose. You will need a negative pregnancy test before starting  this medication. Contraception is recommended while taking this medication and for 3 months after the last dose. Your care team can help you find the option that works for you. Do not breastfeed while taking this medication and for 3 months after the last dose. What side effects may I notice from receiving this medication? Side effects that you should report to your care team as soon as possible: Allergic reactions--skin rash, itching, hives, swelling of the face, lips, tongue, or throat Dry cough, shortness of breath or trouble breathing Eye pain, redness, irritation, or discharge with blurry or decreased vision Heart muscle inflammation--unusual weakness or fatigue, shortness of breath, chest pain, fast or irregular heartbeat, dizziness, swelling of the ankles, feet, or hands Hormone gland problems--headache, sensitivity to light, unusual weakness or fatigue, dizziness, fast or irregular heartbeat, increased sensitivity to cold or heat, excessive sweating, constipation, hair loss, increased thirst or amount of urine, tremors or shaking, irritability Infusion reactions--chest pain, shortness of breath or trouble breathing, feeling faint or lightheaded Kidney injury (glomerulonephritis)--decrease in the amount of urine, red or dark brown urine, foamy or bubbly urine, swelling of the ankles, hands, or feet Liver injury--right upper belly pain, loss of appetite, nausea, light-colored stool, dark yellow or brown urine, yellowing skin  or eyes, unusual weakness or fatigue Pain, tingling, or numbness in the hands or feet, muscle weakness, change in vision, confusion or trouble speaking, loss of balance or coordination, trouble walking, seizures Rash, fever, and swollen lymph nodes Redness, blistering, peeling, or loosening of the skin, including inside the mouth Sudden or severe stomach pain, bloody diarrhea, fever, nausea, vomiting Side effects that usually do not require medical attention (report these  to your care team if they continue or are bothersome): Bone, joint, or muscle pain Diarrhea Fatigue Loss of appetite Nausea Skin rash This list may not describe all possible side effects. Call your doctor for medical advice about side effects. You may report side effects to FDA at 1-800-FDA-1088. Where should I keep my medication? This medication is given in a hospital or clinic. It will not be stored at home. NOTE: This sheet is a summary. It may not cover all possible information. If you have questions about this medicine, talk to your doctor, pharmacist, or health care provider.  2024 Elsevier/Gold Standard (2021-06-01 00:00:00)

## 2023-10-04 NOTE — Telephone Encounter (Signed)
ATC x1.  LVM to return call. 

## 2023-10-05 ENCOUNTER — Telehealth: Payer: Self-pay

## 2023-10-05 ENCOUNTER — Other Ambulatory Visit: Payer: Self-pay

## 2023-10-05 LAB — T4: T4, Total: 7.5 ug/dL (ref 4.5–12.0)

## 2023-10-05 NOTE — Telephone Encounter (Signed)
 24 hour call back  Reached out to patient regarding first infusion. Patient's wife answered and stated patient is doing well with no side effects. Educated patient's wife to please call us  with any questions of concerns.

## 2023-10-06 ENCOUNTER — Other Ambulatory Visit: Payer: Self-pay

## 2023-10-09 NOTE — Telephone Encounter (Signed)
Left message on VM x 2

## 2023-10-09 NOTE — Telephone Encounter (Signed)
 Pt states he is coughing up saliva and mucous mildly but mostly saliva. Clear fluid. Pt has had this cough x2 months. Denies fever and had mild chest pain a month ago but denies any chest pain now. Pt is still complaining moderate sob with exertion but not while resting. Pt states it can be hard to take a full breath of air and he will sound wheezy. Pt would like to know what is recommended for him. Routing to Dr Zaida.

## 2023-10-09 NOTE — Telephone Encounter (Signed)
 Will route to Dr Geronimo instead as he is dod and Dr Zaida will not be in office until Wednesday.

## 2023-10-11 ENCOUNTER — Other Ambulatory Visit: Payer: Self-pay

## 2023-10-11 DIAGNOSIS — J449 Chronic obstructive pulmonary disease, unspecified: Secondary | ICD-10-CM

## 2023-10-11 MED ORDER — BREZTRI AEROSPHERE 160-9-4.8 MCG/ACT IN AERO: 2.0000 | INHALATION_SPRAY | Freq: Two times a day (BID) | RESPIRATORY_TRACT | 4 refills | Status: DC

## 2023-10-11 MED ORDER — BREZTRI AEROSPHERE 160-9-4.8 MCG/ACT IN AERO: 2.0000 | INHALATION_SPRAY | Freq: Two times a day (BID) | RESPIRATORY_TRACT | 4 refills | Status: AC

## 2023-10-11 NOTE — Progress Notes (Signed)
 Faxed to [facility] [phone]

## 2023-10-17 ENCOUNTER — Encounter: Payer: Self-pay | Admitting: Internal Medicine

## 2023-10-17 ENCOUNTER — Ambulatory Visit: Admitting: Internal Medicine

## 2023-10-17 ENCOUNTER — Telehealth: Payer: Self-pay | Admitting: *Deleted

## 2023-10-17 VITALS — BP 121/76 | HR 97 | Temp 98.4°F | Ht 72.0 in | Wt 277.6 lb

## 2023-10-17 DIAGNOSIS — D62 Acute posthemorrhagic anemia: Secondary | ICD-10-CM

## 2023-10-17 DIAGNOSIS — K264 Chronic or unspecified duodenal ulcer with hemorrhage: Secondary | ICD-10-CM

## 2023-10-17 DIAGNOSIS — K269 Duodenal ulcer, unspecified as acute or chronic, without hemorrhage or perforation: Secondary | ICD-10-CM | POA: Diagnosis not present

## 2023-10-17 DIAGNOSIS — Z8719 Personal history of other diseases of the digestive system: Secondary | ICD-10-CM | POA: Diagnosis not present

## 2023-10-17 MED ORDER — PANTOPRAZOLE SODIUM 40 MG PO TBEC: 40.0000 mg | DELAYED_RELEASE_TABLET | Freq: Two times a day (BID) | ORAL | 2 refills | Status: DC

## 2023-10-17 NOTE — Patient Instructions (Signed)
 It was good to see you again today  Please increase the pantoprazole  to 40 mg twice daily best taken before meals (new prescription dispense 60 with 2 additional refills).  Continue to avoid all forms of NSAIDs like ibuprofen aspirin  and Goody powders.  Advise against any alcohol intake  Schedule repeat EGD to for complete assessment of the upper GI tract.(Not seen previously)  Will need to hold Eliquis  for 2 days prior to the procedure  Further recommendations to follow.

## 2023-10-17 NOTE — Progress Notes (Signed)
 Primary Care Physician:  [name], MD Primary Gastroenterologist:  Dr. Shaaron  Pre-Procedure History & Physical: HPI:  Raymond Hurst is a 73 y.o. male here to follow-up on torrential upper GI bleed secondary to a large duodenal ulcer with large exposed branch of the gastroduodenal artery.  This was seen at EGD no direct endoscopic treatment was employed aside from epinephrine .  He was sent to [facility] where he had coil embolization.  He did well with no further bleeding was discharged who presented to [facility] a couple of weeks ago with shortness of breath found to have a pleural effusion (history of stage IV lung cancer).  Underwent 1 L for centesis.  Cytology was negative for malignancy.  Has had no further melena nausea or vomiting.  He dropped back to Protonix  once daily following his discharge from [place].  It is notable that his large amount of blood in the stomach and not all of the stomach was seen.  He has not been assessed for H. pylori.  He denies NSAID use.  He is back on Eliquis .  Hemoglobin up to 10.42 weeks ago.  Past Medical History:  Diagnosis Date   Adrenal tumor    right adrenal gland tumor favored to be a myelolipoma   Atrial fibrillation (HCC)    Cellulitis of right lower leg    Dysrhythmia    A-fib   History of TIA (transient ischemic attack)    May 2020   Lymphedema    OSA on CPAP    severe obstructive sleep apnea with an AHI of 54/h with no significant central events.  He had nocturnal hypoxemia with O2 sat nadir of 70%. Now on CPAP   Pulmonary mass    Stroke [facility])     Past Surgical History:  Procedure Laterality Date   BRONCHIAL BIOPSY  06/12/2023   Procedure: BRONCHOSCOPY, WITH BIOPSY;  Surgeon: Shelah Lamar RAMAN, MD;  Location: [facility] ENDOSCOPY;  Service: Cardiopulmonary;;   BRONCHIAL BRUSHINGS  06/12/2023   Procedure: BRONCHOSCOPY, WITH BRUSH BIOPSY;  Surgeon: Shelah Lamar RAMAN, MD;  Location: [facility] ENDOSCOPY;  Service: Cardiopulmonary;;   BRONCHIAL  NEEDLE ASPIRATION BIOPSY  06/12/2023   Procedure: BRONCHOSCOPY, WITH NEEDLE ASPIRATION BIOPSY;  Surgeon: Shelah Lamar RAMAN, MD;  Location: [facility] ENDOSCOPY;  Service: Cardiopulmonary;;   BRONCHIAL NEEDLE ASPIRATION BIOPSY  08/28/2023   Procedure: BRONCHOSCOPY, WITH NEEDLE ASPIRATION BIOPSY;  Surgeon: Shelah Lamar RAMAN, MD;  Location: [facility] ENDOSCOPY;  Service: Pulmonary;;   CRYOTHERAPY  08/28/2023   Procedure: CRYOTHERAPY;  Surgeon: Shelah Lamar RAMAN, MD;  Location: [facility] ENDOSCOPY;  Service: Pulmonary;;   ESOPHAGOGASTRODUODENOSCOPY N/A 09/09/2023   Procedure: EGD (ESOPHAGOGASTRODUODENOSCOPY);  Surgeon: Shaaron Lamar HERO, MD;  Location: [facility] ENDO SUITE;  Service: Endoscopy;  Laterality: N/A;   HEMOSTASIS CLIP PLACEMENT  06/12/2023   Procedure: CONTROL OF HEMORRHAGE bronch cold saline and epi;  Surgeon: Shelah Lamar RAMAN, MD;  Location: [facility] ENDOSCOPY;  Service: Cardiopulmonary;;   INNER EAR SURGERY     IR ANGIOGRAM SELECTIVE EACH ADDITIONAL VESSEL  09/09/2023   IR ANGIOGRAM VISCERAL SELECTIVE  09/09/2023   IR ANGIOGRAM VISCERAL SELECTIVE  09/09/2023   IR EMBO ART  VEN HEMORR LYMPH EXTRAV  INC GUIDE ROADMAPPING  09/09/2023   IR THORACENTESIS ASP PLEURAL SPACE W/IMG GUIDE  09/26/2023   IR US  GUIDE VASC ACCESS RIGHT  09/09/2023   KNEE SURGERY Left    THORACENTESIS Left 06/06/2023   Procedure: THORACENTESIS;  Surgeon: Arlinda Ranks, MD;  Location: [facility] ENDOSCOPY;  Service: Pulmonary;  Laterality: Left;   TONSILLECTOMY     VIDEO BRONCHOSCOPY WITH ENDOBRONCHIAL NAVIGATION Left 08/28/2023   Procedure: VIDEO BRONCHOSCOPY WITH ENDOBRONCHIAL NAVIGATION;  Surgeon: Shelah Lamar RAMAN, MD;  Location: [facility] ENDOSCOPY;  Service: Pulmonary;  Laterality: Left;   VIDEO BRONCHOSCOPY WITH ENDOBRONCHIAL ULTRASOUND Left 06/12/2023   Procedure: BRONCHOSCOPY, WITH EBUS;  Surgeon: Shelah Lamar RAMAN, MD;  Location: [facility] ENDOSCOPY;  Service: Cardiopulmonary;  Laterality: Left;   VIDEO BRONCHOSCOPY WITH ENDOBRONCHIAL ULTRASOUND  08/28/2023   Procedure: BRONCHOSCOPY, WITH  EBUS;  Surgeon: Shelah Lamar RAMAN, MD;  Location: [facility] ENDOSCOPY;  Service: Pulmonary;;    Prior to Admission medications   Medication Sig Start Date End Date Taking? Authorizing Provider  acetaminophen  (TYLENOL ) 500 MG tablet Take 500 mg by mouth every 6 (six) hours as needed for mild pain.    [provider]  allopurinol  (ZYLOPRIM ) 100 MG tablet Take 100 mg by mouth daily. 05/25/22   [provider]  apixaban  (ELIQUIS ) 5 MG TABS tablet Take 1 tablet (5 mg total) by mouth 2 (two) times daily. Okay to restart this medication on 08/29/2023 08/28/23   Byrum, Loraine Bhullar S, MD  budesonide-glycopyrrolate-formoterol (BREZTRI  AEROSPHERE) 160-9-4.8 MCG/ACT AERO inhaler Inhale 2 puffs into the lungs in the morning and at bedtime. 10/11/23   Hattar, Zola SAILOR, MD  cholecalciferol  (VITAMIN D3) 25 MCG (1000 UNIT) tablet Take 1,000 Units by mouth daily.    [provider]  cyanocobalamin  (VITAMIN B12) 100 MCG tablet Take 100 mcg by mouth daily.    [provider]  folic acid  (FOLVITE ) 1 MG tablet Take 1 tablet (1 mg total) by mouth daily. Patient not taking: Reported on 09/22/2023 09/12/23   Shaaron Lamar HERO, MD  furosemide  (LASIX ) 40 MG tablet Take 20 mg by mouth daily.    [provider]  HYDROcodone  bit-homatropine (HYCODAN) 5-1.5 MG/5ML syrup Take 5 mLs by mouth every 6 (six) hours as needed for cough. 07/03/23   Meade Verdon RAMAN, MD  levalbuterol  (XOPENEX  HFA) 45 MCG/ACT inhaler Inhale 1 puff into the lungs every 4 (four) hours as needed for wheezing. 07/26/23 07/25/24  Desai, Nikita S, MD  loperamide (IMODIUM A-D) 2 MG tablet Take 2-4 mg by mouth 4 (four) times daily as needed for diarrhea or loose stools.    [provider]  metoprolol  tartrate (LOPRESSOR ) 25 MG tablet Take 1 tablet (25 mg total) by mouth 2 (two) times daily. 11/17/21   Strader, Laymon HERO, PA-C  ondansetron  (ZOFRAN ) 8 MG tablet Take 1 tablet (8 mg total) by mouth every 8 (eight) hours as needed (As needed  for nausea starting 3 days after chemo). Patient not taking: Reported on 09/22/2023 07/07/23   Cloretta Arley NOVAK, MD  pantoprazole  (PROTONIX ) 40 MG tablet Take 1 tablet (40 mg total) by mouth 2 (two) times daily before a meal. 09/14/23 12/13/23  Hattar, Zola SAILOR, MD  prochlorperazine  (COMPAZINE ) 10 MG tablet Take 1 tablet (10 mg total) by mouth every 6 (six) hours as needed for nausea or vomiting. Patient not taking: Reported on 09/22/2023 07/07/23   Cloretta Arley NOVAK, MD  thiamine  (VITAMIN B-1) 100 MG tablet Take 100 mg by mouth daily.    [provider]    Allergies as of 10/17/2023   (No Known Allergies)    Family History  Problem Relation Age of Onset   Heart failure Mother    COPD Mother    Atrial fibrillation Mother    Idiopathic pulmonary fibrosis Father     Social History  Socioeconomic History   Marital status: Married    Spouse name: Not on file   Number of children: Not on file   Years of education: Not on file   Highest education level: Not on file  Occupational History   Not on file  Tobacco Use   Smoking status: Former    Current packs/day: 0.00    Average packs/day: 2.0 packs/day for 20.0 years (40.0 ttl pk-yrs)    Types: Cigarettes    Start date: 01/31/1970    Quit date: 01/31/1990    Years since quitting: 33.7   Smokeless tobacco: Never  Vaping Use   Vaping status: Never Used  Substance and Sexual Activity   Alcohol use: Not Currently    Comment: 4-5 beer daily   Drug use: No   Sexual activity: Not on file  Other Topics Concern   Not on file  Social History Narrative   Right handed.    Social Drivers of Corporate investment banker Strain: High Risk (06/15/2023)   Overall Financial Resource Strain (CARDIA)    Difficulty of Paying Living Expenses: Very hard  Food Insecurity: No Food Insecurity (09/07/2023)   Hunger Vital Sign    Worried About Running Out of Food in the Last Year: Never true    Ran Out of Food in the Last Year: Never true  Recent  Concern: Food Insecurity - Food Insecurity Present (06/15/2023)   Hunger Vital Sign    Worried About Running Out of Food in the Last Year: Sometimes true    Ran Out of Food in the Last Year: Sometimes true  Transportation Needs: No Transportation Needs (09/07/2023)   PRAPARE - Administrator, Civil Service (Medical): No    Lack of Transportation (Non-Medical): No  Physical Activity: Inactive (06/15/2023)   Exercise Vital Sign    Days of Exercise per Week: 0 days    Minutes of Exercise per Session: 0 min  Stress: No Stress Concern Present (06/15/2023)   [employer] of Occupational Health - Occupational Stress Questionnaire    Feeling of Stress : Only a little  Social Connections: Moderately Integrated (09/07/2023)   Social Connection and Isolation Panel    Frequency of Communication with Friends and Family: More than three times a week    Frequency of Social Gatherings with Friends and Family: More than three times a week    Attends Religious Services: Patient declined    Database administrator or Organizations: Yes    Attends Engineer, structural: More than 4 times per year    Marital Status: Married  Catering manager Violence: Not At Risk (09/07/2023)   Humiliation, Afraid, Rape, and Kick questionnaire    Fear of Current or Ex-Partner: No    Emotionally Abused: No    Physically Abused: No    Sexually Abused: No    Review of Systems: See HPI, otherwise negative ROS  Physical Exam: There were no vitals taken for this visit. General:   Alert,  Well-developed, well-nourished, pleasant and cooperative in NAD Neck:  Supple; no masses or thyromegaly. No significant cervical adenopathy. Lungs:  Clear throughout to auscultation.   No wheezes, crackles, or rhonchi. No acute distress. Heart:  Regular rate and rhythm; no murmurs, clicks, rubs,  or gallops. Abdomen: Non-distended, normal bowel sounds.  Soft and nontender without appreciable mass or hepatosplenomegaly.    Impression/Plan: 73 year old presented recently with a multiunit upper GI bleed secondary to an exposed branch of the gastroduodenal artery and the  base of the large duodenal ulcer.  Ultimately, he underwent coil embolization which succeeded in stopping the bleeding.  Upper GI tract evaluation incomplete because of the large amount of blood present.  Clinically he is doing well hemoglobin is coming up he is on once daily PPI back on Eliquis .  Hemoglobin is steadily climbing.  Recommendations:   Please increase the pantoprazole  to 40 mg twice daily best taken before meals (new prescription dispense 60 with 2 additional refills).  Continue to avoid all forms of NSAIDs like ibuprofen aspirin  and Goody powders.  Advise against any alcohol intake  Schedule repeat EGD to for complete assessment of the upper GI tract.(Not seen previously).  The risks, benefits, limitations, alternatives and imponderables have been reviewed with the patient. Potential for esophageal dilation, biopsy, etc. have also been reviewed.  Questions have been answered. All parties agreeable.   Will need to hold Eliquis  for 2 days prior to the procedure  Further recommendations to follow.   Notice: This dictation was prepared with Dragon dictation along with smaller phrase technology. Any transcriptional errors that result from this process are unintentional and may not be corrected upon review.

## 2023-10-17 NOTE — Telephone Encounter (Signed)
  Request for patient to stop medication prior to procedure or is needing cleareance  10/17/23  Raymond Hurst [birth year]  What type of surgery is being performed? Esophagogastroduodenoscopy (EGD)  When is surgery scheduled? TBD  What type of clearance is required (medical or pharmacy to hold medication or both? medication  Are there any medications that need to be held prior to surgery and how long? Eliquis  x 2 days  Name of physician performing surgery?  Dr.Rourk [facility] [employer]: [phone], option 5 Fax: [phone]  Anesthesia type (none, local, MAC, general)? MAC   ? Yes ? No Patient can hold medication as requested   Signature: ___________________________

## 2023-10-17 NOTE — Addendum Note (Signed)
 Addended by: WELLINGTON MILLING on: 10/17/2023 11:09 AM   Modules accepted: Orders

## 2023-10-18 NOTE — Telephone Encounter (Signed)
 Yes, patient can stop his Eliquis  2 days prior. Then restart it as per Dr Mona instructions.

## 2023-10-24 ENCOUNTER — Other Ambulatory Visit: Payer: Self-pay

## 2023-10-24 DIAGNOSIS — J90 Pleural effusion, not elsewhere classified: Secondary | ICD-10-CM

## 2023-10-24 MED ORDER — PREDNISONE 20 MG PO TABS: 40.0000 mg | ORAL_TABLET | Freq: Every day | ORAL | 0 refills | Status: AC

## 2023-10-24 NOTE — Progress Notes (Signed)
 Called patient back, he is complaining of some shortness of breath and some mucous production. Can't take deep breaths. I am worried that his pleural effusion is back. Mrs Khamis is worried that this is his allergies. I ordered some prednisone  to try and see if it helps with his COPD. Discussed going to the ER if symptoms are any worse and they are in agreement. I ordered a CXR to assess his effusion, they might not be able to do the xray tomorrow as they don't have transportation but will eventually get the CXR. I will send for a nebulizer DME tomorrow.    Zola Herter, MD [facility] Office: [phone]   See Amion for personal pager PCCM on call pager [phone] until 7pm. Please call Elink 7p-7a. [phone]

## 2023-10-26 ENCOUNTER — Ambulatory Visit: Admitting: Oncology

## 2023-10-26 ENCOUNTER — Ambulatory Visit

## 2023-10-26 ENCOUNTER — Other Ambulatory Visit

## 2023-10-27 NOTE — Telephone Encounter (Signed)
 LMOVM to return call  EGD w/Dr.Rourk, asa 3

## 2023-10-29 ENCOUNTER — Other Ambulatory Visit: Payer: Self-pay | Admitting: Oncology

## 2023-10-30 ENCOUNTER — Encounter: Payer: Self-pay | Admitting: *Deleted

## 2023-10-30 NOTE — Telephone Encounter (Signed)
 FYI    Pt has been scheduled for 11/22/23, instructions sent via mychart.  [employer] PA: Approved Authorization #[id number]  Tracking #[id number] Dates of service 11/22/2023 - 02/21/2024

## 2023-10-30 NOTE — Telephone Encounter (Signed)
 LMOVM to return call.

## 2023-10-31 ENCOUNTER — Ambulatory Visit: Admission: RE | Admit: 2023-10-31 | Discharge: 2023-10-31 | Disposition: A | Source: Ambulatory Visit

## 2023-10-31 ENCOUNTER — Other Ambulatory Visit: Payer: Self-pay

## 2023-10-31 DIAGNOSIS — J90 Pleural effusion, not elsewhere classified: Secondary | ICD-10-CM | POA: Diagnosis not present

## 2023-10-31 MED ORDER — IPRATROPIUM-ALBUTEROL 0.5-2.5 (3) MG/3ML IN SOLN: 3.0000 mL | Freq: Four times a day (QID) | RESPIRATORY_TRACT | 6 refills | Status: AC | PRN

## 2023-10-31 NOTE — Telephone Encounter (Signed)
Order placed and rx sent

## 2023-10-31 NOTE — Addendum Note (Signed)
 Addended by: Gearldean Lomanto M on: 10/31/2023 11:55 AM   Modules accepted: Orders

## 2023-11-01 ENCOUNTER — Inpatient Hospital Stay: Attending: Nurse Practitioner

## 2023-11-01 ENCOUNTER — Inpatient Hospital Stay: Admitting: Nurse Practitioner

## 2023-11-01 ENCOUNTER — Telehealth: Payer: Self-pay

## 2023-11-01 ENCOUNTER — Encounter: Payer: Self-pay | Admitting: Nurse Practitioner

## 2023-11-01 ENCOUNTER — Inpatient Hospital Stay

## 2023-11-01 VITALS — BP 119/66 | HR 86 | Temp 98.3°F | Resp 18 | Ht 72.0 in | Wt 268.1 lb

## 2023-11-01 DIAGNOSIS — C3412 Malignant neoplasm of upper lobe, left bronchus or lung: Secondary | ICD-10-CM | POA: Insufficient documentation

## 2023-11-01 DIAGNOSIS — Z23 Encounter for immunization: Secondary | ICD-10-CM | POA: Insufficient documentation

## 2023-11-01 DIAGNOSIS — Z5112 Encounter for antineoplastic immunotherapy: Secondary | ICD-10-CM | POA: Diagnosis present

## 2023-11-01 DIAGNOSIS — Z79620 Long term (current) use of immunosuppressive biologic: Secondary | ICD-10-CM | POA: Insufficient documentation

## 2023-11-01 LAB — CMP (CANCER CENTER ONLY)
ALT: 8 U/L (ref 0–44)
AST: 13 U/L — ABNORMAL LOW (ref 15–41)
Albumin: 3.7 g/dL (ref 3.5–5.0)
Alkaline Phosphatase: 67 U/L (ref 38–126)
Anion gap: 11 (ref 5–15)
BUN: 14 mg/dL (ref 8–23)
CO2: 24 mmol/L (ref 22–32)
Calcium: 9.8 mg/dL (ref 8.9–10.3)
Chloride: 105 mmol/L (ref 98–111)
Creatinine: 0.76 mg/dL (ref 0.61–1.24)
GFR, Estimated: >60 mL/min (ref >60)
Glucose, Bld: 85 mg/dL (ref 70–99)
Potassium: 3.5 mmol/L (ref 3.5–5.1)
Sodium: 140 mmol/L (ref 135–145)
Total Bilirubin: 0.4 mg/dL (ref 0.0–1.2)
Total Protein: 6.7 g/dL (ref 6.5–8.1)

## 2023-11-01 LAB — CBC WITH DIFFERENTIAL (CANCER CENTER ONLY)
Abs Immature Granulocytes: 0.02 K/uL (ref 0.00–0.07)
Basophils Absolute: 0 K/uL (ref 0.0–0.1)
Basophils Relative: 1 %
Eosinophils Absolute: 0.1 K/uL (ref 0.0–0.5)
Eosinophils Relative: 2 %
HCT: 33.9 % — ABNORMAL LOW (ref 39.0–52.0)
Hemoglobin: 10.8 g/dL — ABNORMAL LOW (ref 13.0–17.0)
Immature Granulocytes: 0 %
Lymphocytes Relative: 11 %
Lymphs Abs: 0.5 K/uL — ABNORMAL LOW (ref 0.7–4.0)
MCH: 29.8 pg (ref 26.0–34.0)
MCHC: 31.9 g/dL (ref 30.0–36.0)
MCV: 93.6 fL (ref 80.0–100.0)
Monocytes Absolute: 0.5 K/uL (ref 0.1–1.0)
Monocytes Relative: 10 %
Neutro Abs: 3.8 K/uL (ref 1.7–7.7)
Neutrophils Relative %: 76 %
Platelet Count: 202 K/uL (ref 150–400)
RBC: 3.62 MIL/uL — ABNORMAL LOW (ref 4.22–5.81)
RDW: 14.6 % (ref 11.5–15.5)
WBC Count: 5 K/uL (ref 4.0–10.5)
nRBC: 0 % (ref 0.0–0.2)

## 2023-11-01 MED ORDER — SODIUM CHLORIDE 0.9 % IV SOLN
1500.0000 mg | Freq: Once | INTRAVENOUS | Status: AC
  Administered 2023-11-01: 1500 mg via INTRAVENOUS
  Filled 2023-11-01: qty 30

## 2023-11-01 MED ORDER — SODIUM CHLORIDE 0.9 % IV SOLN: INTRAVENOUS | Status: DC

## 2023-11-01 NOTE — Progress Notes (Signed)
 Patient seen by Olam Ned NP today  Vitals are within treatment parameters:Yes   Labs are within treatment parameters: Yes   Treatment plan has been signed: Yes   Per physician team, Patient is ready for treatment and there are NO modifications to the treatment plan.

## 2023-11-01 NOTE — Progress Notes (Signed)
 [facility] OFFICE PROGRESS NOTE   Diagnosis: Non-small cell lung cancer  INTERVAL HISTORY:   Raymond Hurst returns for follow-up.  He completed cycle 1 Durvalumab  10/04/2023.  He has noted a rash mainly at the left upper outer arm for the past 2+ weeks.  The rash was initially pruritic.  He thinks there may have also been a faint rash at the right upper outer arm though this is no longer visible.  No mouth sores.  No rash/blisters on the palms or soles.  He denies diarrhea.  No nausea or vomiting.  He continues to have dyspnea on exertion and a cough.  Objective:  Vital signs in last 24 hours:  Blood pressure 119/66, pulse 86, temperature 98.3 F (36.8 C), temperature source Temporal, resp. rate 18, height 6' (1.829 m), weight 268 lb 1.6 oz (121.6 kg), SpO2 100%.    HEENT: No thrush or ulcers. Resp: Distant breath sounds.  No respiratory distress. Cardio: Irregular. GI: No hepatosplenomegaly.  Nontender. Vascular: Bilateral lower leg edema. Neuro: Alert and oriented. Skin: Flat erythematous skin rash left upper outer arm, appears most consistent with sunburn.  Palms without erythema.   Lab Results:  Lab Results  Component Value Date   WBC 5.0 11/01/2023   HGB 10.8 (L) 11/01/2023   HCT 33.9 (L) 11/01/2023   MCV 93.6 11/01/2023   PLT 202 11/01/2023   NEUTROABS 3.8 11/01/2023    Imaging:  DG Chest 2 View Result Date: 11/01/2023 CLINICAL DATA:  Shortness of breath. EXAM: CHEST - 2 VIEW COMPARISON:  September 26, 2023. FINDINGS: Stable cardiomegaly. Right lung is clear. Mild loculated left pleural effusion is noted with probable associated atelectasis or infiltrate. Increased opacity is noted laterally in left upper lobe concerning for loculated effusion as well. Left upper lobe opacity is noted concerning for infiltrate or possibly neoplasm. Bony thorax is unremarkable. IMPRESSION: Mild loculated left pleural effusion is noted with probable associated atelectasis or  infiltrate. Increased opacity is noted laterally in left upper lobe concerning for loculated effusion as well. Left upper lobe opacity is noted concerning for infiltrate or possibly neoplasm. CT scan of the chest with intravenous contrast is recommended for further evaluation. Electronically Signed   By: Lynwood Landy Raddle M.D.   On: 11/01/2023 10:25    Medications: I have reviewed the patient's current medications.  Assessment/Plan: Left lung mass CT chest 06/05/2023: Left upper lobe mass with invasion of the mediastinum and effacement of the left upper lobe and lower lobe pulmonary arteries, moderate left pleural effusion, small mediastinal lymph nodes, right adrenal mass favoring myolipoma, probable benign lucent lesion at the right scapula Diagnostic left thoracentesis 06/06/2023: Cytology-no malignant cells identified Bronchoscopy with endobronchial ultrasound and biopsies 06/12/2023-lateral left mainstem bronchial irregularity extending into the left upper lobe airways narrowing the left upper lobe airways, no discrete endobronchial lesion.  Endobronchial ultrasound showed a left hilar mass with significant vascularity; enlargement at station 4L, 11L, 7.  Station 7, 4L and 11L lymph nodes negative for malignancy; left hilar mass/lymph node metastatic carcinoma; left upper lobe endobronchial brushing malignant cells present; left upper lobe endobronchial biopsy suspicious for malignancy; weak focal p63 positivity in the tumor cells, negative for TTF-1, synaptophysin and CD56, a poorly differentiated squamous cell carcinoma is favored; PD-L1 pending; foundation 1-HRD signature/microsatellite status/tumor mutation burden cannot be determined, IDH1 M880V. PET scan 06/20/2023-large left hilar mass primarily in the left upper lobe.  Left adrenal gland with hypermetabolic activity suspicious for early metastatic lesion. Brain MRI 06/30/2023-negative  for metastatic disease Week 1 Taxol /carboplatin   07/10/2023 Radiation chest left adrenal metastasis 07/11/2023-09/01/2023 Week 2 Taxol /carboplatin  07/17/2023 Week 3 Taxol /carboplatin  07/24/2023 Week 4 Taxol /carboplatin  08/01/2023 Week 5 Taxol /carboplatin  08/08/2023 Week 6 Taxol /carboplatin  08/14/2023 Week 7 Taxol /carboplatin  08/23/2023 Bronchoscopy 08/28/2023 with biopsy of the left upper lobe mass-non-small cell carcinoma-adenocarcinoma favored; PD-L1 tumor proportion score 20% Thoracentesis 09/26/2023-1 L of fluid removed, no malignant cells Cycle 1 Durvalumab  10/04/2023 Cycle 2 Durvalumab  11/01/2023   2.  Hoarseness/cough secondary to #1-improved following vocal cord procedure 06/29/2023, Restylane injection into bilateral lateral thyroid  arytenoid muscles 07/31/2023 3.  Atrial fibrillation 4.  Lower extremity edema 5.  Alcohol use 6.  Large external hemorrhoid on exam 07/17/2023 7.  Admission 09/07/2023 with acute upper GI bleeding 09/09/2023 EGD-duodenal ulcer with a visible vessel treated with epinephrine  09/09/2023-IR coil embolization of the GDA  Disposition: Raymond Hurst appears stable.  He has completed 1 cycle of Durvalumab .  Plan to proceed with cycle 2 today as scheduled.  CBC and chemistry panel reviewed.  Labs adequate for treatment.  The rash at the left upper outer arm appears most consistent with a sunburn.  He will contact the office if this increases or he develops a rash in other areas.  A picture of the rash was scanned into his chart.  He will return for follow-up and Durvalumab  in 4 weeks.  We are available to see him sooner if needed.  Patient seen with Dr. Cloretta.  Olam Ned ANP/GNP-BC   11/01/2023  11:14 AM  This was a shared visit with Olam Ned.  Raymond Hurst was interviewed and examined.  He has completed 1 treatment with durvalumab .  The left upper arm rash appears to be in a sun distribution.  The rash is most likely unrelated to Durvalumab .  He will complete another cycle of durvalumab  today.  Arvella Cloretta,  MD

## 2023-11-01 NOTE — Patient Instructions (Signed)
 [facility] - A DEPT OF [facility]  Discharge Instructions: Thank you for choosing [facility] to provide your oncology and hematology care.   If you have a lab appointment with the Cancer Center, please go directly to the Cancer Center and check in at the registration area.   Wear comfortable clothing and clothing appropriate for easy access to any Portacath or PICC line.   We strive to give you quality time with your provider. You may need to reschedule your appointment if you arrive late (15 or more minutes).  Arriving late affects you and other patients whose appointments are after yours.  Also, if you miss three or more appointments without notifying the office, you may be dismissed from the clinic at the provider's discretion.      For prescription refill requests, have your pharmacy contact our office and allow 72 hours for refills to be completed.    Today you received the following chemotherapy and/or immunotherapy agents: imfinzi       To help prevent nausea and vomiting after your treatment, we encourage you to take your nausea medication as directed.  BELOW ARE SYMPTOMS THAT SHOULD BE REPORTED IMMEDIATELY: *FEVER GREATER THAN 100.4 F (38 C) OR HIGHER *CHILLS OR SWEATING *NAUSEA AND VOMITING THAT IS NOT CONTROLLED WITH YOUR NAUSEA MEDICATION *UNUSUAL SHORTNESS OF BREATH *UNUSUAL BRUISING OR BLEEDING *URINARY PROBLEMS (pain or burning when urinating, or frequent urination) *BOWEL PROBLEMS (unusual diarrhea, constipation, pain near the anus) TENDERNESS IN MOUTH AND THROAT WITH OR WITHOUT PRESENCE OF ULCERS (sore throat, sores in mouth, or a toothache) UNUSUAL RASH, SWELLING OR PAIN  UNUSUAL VAGINAL DISCHARGE OR ITCHING   Items with * indicate a potential emergency and should be followed up as soon as possible or go to the Emergency Department if any problems should occur.  Please show the CHEMOTHERAPY ALERT CARD or IMMUNOTHERAPY  ALERT CARD at check-in to the Emergency Department and triage nurse.  Should you have questions after your visit or need to cancel or reschedule your appointment, please contact [facility] - A DEPT OF [facility]  Dept: [phone]  and follow the prompts.  Office hours are 8:00 a.m. to 4:30 p.m. Monday - Friday. Please note that voicemails left after 4:00 p.m. may not be returned until the following business day.  We are closed weekends and major holidays. You have access to a nurse at all times for urgent questions. Please call the main number to the clinic Dept: [phone] and follow the prompts.   For any non-urgent questions, you may also contact your provider using MyChart. We now offer e-Visits for anyone 10 and older to request care online for non-urgent symptoms. For details visit [email or web address].   Also download the MyChart app! Go to the app store, search MyChart, open the app, select [facility], and log in with your MyChart username and password.

## 2023-11-01 NOTE — Telephone Encounter (Signed)
 Copied from CRM #[id number]. Topic: Clinical - Medication Question >> Oct 30, 2023 12:34 PM Corean SAUNDERS wrote: Reason for CRM: Patients wife [name] is requesting Dr. Zaida to please order more Prednisone  for the patient as it has been helping and only has 2 days left and needs more.  [name] is also inquiring about a CT order that she states was supposed to be sent over by Dr. Zaida. Please call [name] back as she states the patient wanted to complete the chest x ray today at [facility] in [place].    [name] can be reached at [phone] >> Oct 31, 2023  2:23 PM Isabell A wrote: Spouse calling back for any updates.

## 2023-11-07 ENCOUNTER — Encounter: Payer: Self-pay | Admitting: Oncology

## 2023-11-07 ENCOUNTER — Other Ambulatory Visit: Payer: Self-pay

## 2023-11-14 DIAGNOSIS — I83891 Varicose veins of right lower extremities with other complications: Secondary | ICD-10-CM | POA: Diagnosis not present

## 2023-11-16 ENCOUNTER — Encounter (HOSPITAL_COMMUNITY): Payer: Self-pay

## 2023-11-17 ENCOUNTER — Encounter (HOSPITAL_COMMUNITY)
Admission: RE | Admit: 2023-11-17 | Discharge: 2023-11-17 | Disposition: A | Source: Ambulatory Visit | Attending: Internal Medicine

## 2023-11-17 ENCOUNTER — Other Ambulatory Visit: Payer: Self-pay

## 2023-11-17 HISTORY — DX: Chronic obstructive pulmonary disease, unspecified: J44.9

## 2023-11-20 DIAGNOSIS — I83892 Varicose veins of left lower extremities with other complications: Secondary | ICD-10-CM | POA: Diagnosis not present

## 2023-11-21 ENCOUNTER — Telehealth: Payer: Self-pay | Admitting: *Deleted

## 2023-11-21 NOTE — Telephone Encounter (Signed)
-----   Message from Nurse Randine MATSU sent at 11/20/2023  3:54 PM EDT ----- Regarding: Cancellation Patient did not stop his Eliquis  for his EGD on the 11/22/23.  I spoke with Dr Shaaron and he wants him off the Eliquis  for 2 full days before the procedure. So we will need to reschedule.  The patient is aware. Thank you!

## 2023-11-22 ENCOUNTER — Other Ambulatory Visit: Payer: Self-pay

## 2023-11-22 ENCOUNTER — Ambulatory Visit (HOSPITAL_COMMUNITY): Admission: RE | Admit: 2023-11-22 | Source: Home / Self Care | Admitting: Internal Medicine

## 2023-11-22 ENCOUNTER — Encounter (HOSPITAL_COMMUNITY): Admission: RE | Payer: Self-pay | Source: Home / Self Care

## 2023-11-22 SURGERY — EGD (ESOPHAGOGASTRODUODENOSCOPY)
Anesthesia: Choice

## 2023-11-24 ENCOUNTER — Other Ambulatory Visit: Payer: Self-pay | Admitting: Oncology

## 2023-11-30 ENCOUNTER — Inpatient Hospital Stay

## 2023-11-30 ENCOUNTER — Inpatient Hospital Stay: Admitting: Oncology

## 2023-11-30 VITALS — BP 124/70 | HR 69 | Resp 18

## 2023-11-30 VITALS — BP 107/61 | HR 72 | Temp 97.6°F | Resp 18 | Ht 72.0 in | Wt 268.8 lb

## 2023-11-30 DIAGNOSIS — C3412 Malignant neoplasm of upper lobe, left bronchus or lung: Secondary | ICD-10-CM | POA: Diagnosis not present

## 2023-11-30 DIAGNOSIS — Z5112 Encounter for antineoplastic immunotherapy: Secondary | ICD-10-CM | POA: Diagnosis not present

## 2023-11-30 DIAGNOSIS — Z23 Encounter for immunization: Secondary | ICD-10-CM

## 2023-11-30 LAB — CMP (CANCER CENTER ONLY)
ALT: 5 U/L (ref 0–44)
AST: 13 U/L — ABNORMAL LOW (ref 15–41)
Albumin: 3.7 g/dL (ref 3.5–5.0)
Alkaline Phosphatase: 69 U/L (ref 38–126)
Anion gap: 8 (ref 5–15)
BUN: 14 mg/dL (ref 8–23)
CO2: 25 mmol/L (ref 22–32)
Calcium: 9.4 mg/dL (ref 8.9–10.3)
Chloride: 106 mmol/L (ref 98–111)
Creatinine: 0.86 mg/dL (ref 0.61–1.24)
GFR, Estimated: >60 mL/min (ref >60)
Glucose, Bld: 96 mg/dL (ref 70–99)
Potassium: 3.9 mmol/L (ref 3.5–5.1)
Sodium: 139 mmol/L (ref 135–145)
Total Bilirubin: 0.5 mg/dL (ref 0.0–1.2)
Total Protein: 6.6 g/dL (ref 6.5–8.1)

## 2023-11-30 LAB — CBC WITH DIFFERENTIAL (CANCER CENTER ONLY)
Abs Immature Granulocytes: 0.03 K/uL (ref 0.00–0.07)
Basophils Absolute: 0 K/uL (ref 0.0–0.1)
Basophils Relative: 0 %
Eosinophils Absolute: 0.2 K/uL (ref 0.0–0.5)
Eosinophils Relative: 4 %
HCT: 35 % — ABNORMAL LOW (ref 39.0–52.0)
Hemoglobin: 11.3 g/dL — ABNORMAL LOW (ref 13.0–17.0)
Immature Granulocytes: 1 %
Lymphocytes Relative: 8 %
Lymphs Abs: 0.4 K/uL — ABNORMAL LOW (ref 0.7–4.0)
MCH: 28.8 pg (ref 26.0–34.0)
MCHC: 32.3 g/dL (ref 30.0–36.0)
MCV: 89.3 fL (ref 80.0–100.0)
Monocytes Absolute: 0.5 K/uL (ref 0.1–1.0)
Monocytes Relative: 9 %
Neutro Abs: 4.5 K/uL (ref 1.7–7.7)
Neutrophils Relative %: 78 %
Platelet Count: 182 K/uL (ref 150–400)
RBC: 3.92 MIL/uL — ABNORMAL LOW (ref 4.22–5.81)
RDW: 14.6 % (ref 11.5–15.5)
WBC Count: 5.8 K/uL (ref 4.0–10.5)
nRBC: 0 % (ref 0.0–0.2)

## 2023-11-30 LAB — TSH: TSH: 1.45 u[IU]/mL (ref 0.350–4.500)

## 2023-11-30 MED ORDER — SODIUM CHLORIDE 0.9 % IV SOLN: INTRAVENOUS | Status: DC

## 2023-11-30 MED ORDER — SODIUM CHLORIDE 0.9 % IV SOLN
1500.0000 mg | Freq: Once | INTRAVENOUS | Status: AC
  Administered 2023-11-30: 1500 mg via INTRAVENOUS
  Filled 2023-11-30: qty 30

## 2023-11-30 MED ORDER — INFLUENZA VAC SPLIT HIGH-DOSE 0.5 ML IM SUSY
0.5000 mL | PREFILLED_SYRINGE | Freq: Once | INTRAMUSCULAR | Status: AC
  Administered 2023-11-30: 0.5 mL via INTRAMUSCULAR
  Filled 2023-11-30: qty 0.5

## 2023-11-30 NOTE — Progress Notes (Signed)
 Patient seen by Dr. Arley Hof today  Vitals are within treatment parameters:Yes   Labs are within treatment parameters: Yes   Treatment plan has been signed: Yes   Per physician team, Patient is ready for treatment and there are NO modifications to the treatment plan.  Requesting flu vaccine-order is in.

## 2023-11-30 NOTE — Progress Notes (Signed)
 [facility] OFFICE PROGRESS NOTE   Diagnosis: Non small cell lung cancer  INTERVAL HISTORY:   Raymond. Attridge returns as scheduled.  He continues adjuvant durvalumab .  He last received treatment on 11/01/2023.  He is tolerating the durvalumab  well.  He continues to have hoarseness and a cough.  He has a good appetite.  No new complaint.  He has not received the nebulizer equipment ordered by pulmonary medicine.  No hemoptysis or hematemesis.  Objective:  Vital signs in last 24 hours:  Blood pressure 107/61, pulse 72, temperature 97.6 F (36.4 C), temperature source Temporal, resp. rate 18, height 6' (1.829 m), weight 268 lb 12.8 oz (121.9 kg), SpO2 95%.  Resp: Bilateral expiratory wheeze, good air movement bilaterally, no respiratory distress Cardio: Irregular GI: No hepatosplenomegaly Vascular: No lower leg edema  Skin: Rash at the left greater than right upper posterior arm  Lab Results:  Lab Results  Component Value Date   WBC 5.8 11/30/2023   HGB 11.3 (L) 11/30/2023   HCT 35.0 (L) 11/30/2023   MCV 89.3 11/30/2023   PLT 182 11/30/2023   NEUTROABS 4.5 11/30/2023    CMP  Lab Results  Component Value Date   NA 140 11/01/2023   K 3.5 11/01/2023   CL 105 11/01/2023   CO2 24 11/01/2023   GLUCOSE 85 11/01/2023   BUN 14 11/01/2023   CREATININE 0.76 11/01/2023   CALCIUM  9.8 11/01/2023   PROT 6.7 11/01/2023   ALBUMIN 3.7 11/01/2023   AST 13 (L) 11/01/2023   ALT 8 11/01/2023   ALKPHOS 67 11/01/2023   BILITOT 0.4 11/01/2023   GFRNONAA >60 11/01/2023   GFRAA >60 06/13/2019     Medications: I have reviewed the patient's current medications.   Assessment/Plan:  Left lung mass CT chest 06/05/2023: Left upper lobe mass with invasion of the mediastinum and effacement of the left upper lobe and lower lobe pulmonary arteries, moderate left pleural effusion, small mediastinal lymph nodes, right adrenal mass favoring myolipoma, probable benign lucent lesion at the  right scapula Diagnostic left thoracentesis 06/06/2023: Cytology-no malignant cells identified Bronchoscopy with endobronchial ultrasound and biopsies 06/12/2023-lateral left mainstem bronchial irregularity extending into the left upper lobe airways narrowing the left upper lobe airways, no discrete endobronchial lesion.  Endobronchial ultrasound showed a left hilar mass with significant vascularity; enlargement at station 4L, 11L, 7.  Station 7, 4L and 11L lymph nodes negative for malignancy; left hilar mass/lymph node metastatic carcinoma; left upper lobe endobronchial brushing malignant cells present; left upper lobe endobronchial biopsy suspicious for malignancy; weak focal p63 positivity in the tumor cells, negative for TTF-1, synaptophysin and CD56, a poorly differentiated squamous cell carcinoma is favored; PD-L1 pending; foundation 1-HRD signature/microsatellite status/tumor mutation burden cannot be determined, IDH1 [id number]. PET scan 06/20/2023-large left hilar mass primarily in the left upper lobe.  Left adrenal gland with hypermetabolic activity suspicious for early metastatic lesion. Brain MRI 06/30/2023-negative for metastatic disease Week 1 Taxol /carboplatin  07/10/2023 Radiation chest left adrenal metastasis 07/11/2023-09/01/2023 Week 2 Taxol /carboplatin  07/17/2023 Week 3 Taxol /carboplatin  07/24/2023 Week 4 Taxol /carboplatin  08/01/2023 Week 5 Taxol /carboplatin  08/08/2023 Week 6 Taxol /carboplatin  08/14/2023 Week 7 Taxol /carboplatin  08/23/2023 Bronchoscopy 08/28/2023 with biopsy of the left upper lobe mass-non-small cell carcinoma-adenocarcinoma favored; PD-L1 tumor proportion score 20% Thoracentesis 09/26/2023-1 L of fluid removed, no malignant cells Cycle 1 Durvalumab  10/04/2023 Cycle 2 Durvalumab  11/01/2023 Cycle 3 durvalumab  11/30/2023   2.  Hoarseness/cough secondary to #1-improved following vocal cord procedure 06/29/2023, Restylane injection into bilateral lateral thyroid  arytenoid muscles 07/31/2023 3.  Atrial fibrillation 4.  Lower extremity edema 5.  Alcohol use 6.  Large external hemorrhoid on exam 07/17/2023 7.  Admission 09/07/2023 with acute upper GI bleeding 09/09/2023 EGD-duodenal ulcer with a visible vessel treated with epinephrine  09/09/2023-IR coil embolization of the GDA   Disposition: Raymond Hurst appears stable.  He is tolerating the durvalumab  well.  He will complete another cycle today.  He will return for an office visit in Durvalumab  in 1 month.  I recommended he follow-up with pulmonary medicine for ongoing management of COPD.  The left lung opacity on chest x-ray is likely related to the known cancer and radiation.  We will plan for a restaging CT within the next 2-3 months.    Arley Hof, MD  11/30/2023  11:13 AM

## 2023-11-30 NOTE — Patient Instructions (Signed)
 [facility] - A DEPT OF [facility]  Discharge Instructions: Thank you for choosing [facility] to provide your oncology and hematology care.   If you have a lab appointment with the Cancer Center, please go directly to the Cancer Center and check in at the registration area.   Wear comfortable clothing and clothing appropriate for easy access to any Portacath or PICC line.   We strive to give you quality time with your provider. You may need to reschedule your appointment if you arrive late (15 or more minutes).  Arriving late affects you and other patients whose appointments are after yours.  Also, if you miss three or more appointments without notifying the office, you may be dismissed from the clinic at the provider's discretion.      For prescription refill requests, have your pharmacy contact our office and allow 72 hours for refills to be completed.    Today you received the following chemotherapy and/or immunotherapy agents: imfinzi       To help prevent nausea and vomiting after your treatment, we encourage you to take your nausea medication as directed.  BELOW ARE SYMPTOMS THAT SHOULD BE REPORTED IMMEDIATELY: *FEVER GREATER THAN 100.4 F (38 C) OR HIGHER *CHILLS OR SWEATING *NAUSEA AND VOMITING THAT IS NOT CONTROLLED WITH YOUR NAUSEA MEDICATION *UNUSUAL SHORTNESS OF BREATH *UNUSUAL BRUISING OR BLEEDING *URINARY PROBLEMS (pain or burning when urinating, or frequent urination) *BOWEL PROBLEMS (unusual diarrhea, constipation, pain near the anus) TENDERNESS IN MOUTH AND THROAT WITH OR WITHOUT PRESENCE OF ULCERS (sore throat, sores in mouth, or a toothache) UNUSUAL RASH, SWELLING OR PAIN  UNUSUAL VAGINAL DISCHARGE OR ITCHING   Items with * indicate a potential emergency and should be followed up as soon as possible or go to the Emergency Department if any problems should occur.  Please show the CHEMOTHERAPY ALERT CARD or IMMUNOTHERAPY  ALERT CARD at check-in to the Emergency Department and triage nurse.  Should you have questions after your visit or need to cancel or reschedule your appointment, please contact [facility] - A DEPT OF [facility]  Dept: [phone]  and follow the prompts.  Office hours are 8:00 a.m. to 4:30 p.m. Monday - Friday. Please note that voicemails left after 4:00 p.m. may not be returned until the following business day.  We are closed weekends and major holidays. You have access to a nurse at all times for urgent questions. Please call the main number to the clinic Dept: [phone] and follow the prompts.   For any non-urgent questions, you may also contact your provider using MyChart. We now offer e-Visits for anyone 10 and older to request care online for non-urgent symptoms. For details visit [email or web address].   Also download the MyChart app! Go to the app store, search MyChart, open the app, select [facility], and log in with your MyChart username and password.

## 2023-12-01 ENCOUNTER — Other Ambulatory Visit: Payer: Self-pay

## 2023-12-01 LAB — T4: T4, Total: 7 ug/dL (ref 4.5–12.0)

## 2023-12-06 DIAGNOSIS — I83891 Varicose veins of right lower extremities with other complications: Secondary | ICD-10-CM | POA: Diagnosis not present

## 2023-12-07 ENCOUNTER — Other Ambulatory Visit: Payer: Self-pay | Admitting: Internal Medicine

## 2023-12-21 DIAGNOSIS — I83811 Varicose veins of right lower extremities with pain: Secondary | ICD-10-CM | POA: Diagnosis not present

## 2023-12-21 DIAGNOSIS — M7989 Other specified soft tissue disorders: Secondary | ICD-10-CM | POA: Diagnosis not present

## 2023-12-21 DIAGNOSIS — I83891 Varicose veins of right lower extremities with other complications: Secondary | ICD-10-CM | POA: Diagnosis not present

## 2023-12-26 ENCOUNTER — Inpatient Hospital Stay: Admitting: Oncology

## 2023-12-26 ENCOUNTER — Inpatient Hospital Stay

## 2023-12-26 ENCOUNTER — Inpatient Hospital Stay: Attending: Nurse Practitioner

## 2023-12-26 ENCOUNTER — Telehealth: Payer: Self-pay | Admitting: *Deleted

## 2023-12-26 NOTE — Telephone Encounter (Signed)
 Mr. Console did not arrive for his lab/OV/treatment today. LVM with his wife to please call and let us  know how he is doing and can try to reschedule for next week if possible.

## 2023-12-27 ENCOUNTER — Other Ambulatory Visit

## 2023-12-27 ENCOUNTER — Encounter: Payer: Self-pay | Admitting: *Deleted

## 2023-12-27 ENCOUNTER — Ambulatory Visit

## 2023-12-27 ENCOUNTER — Ambulatory Visit: Admitting: Oncology

## 2023-12-27 NOTE — Telephone Encounter (Signed)
 Pt has been rescheduled for 01/10/24. Instructions sent via mychart.

## 2024-01-01 ENCOUNTER — Telehealth: Payer: Self-pay | Admitting: Cardiology

## 2024-01-01 NOTE — Telephone Encounter (Signed)
 Pt c/o medication issue:  1. Name of Medication:   furosemide  (LASIX ) 20 MG tablet    2. How are you currently taking this medication (dosage and times per day)?  1/2 tablet daily (10 MG total)  3. Are you having a reaction (difficulty breathing--STAT)?   4. What is your medication issue?   Wife says the medication is causing frequent urination and she would like to know if the dose can be decreased. She says patient has been splitting 20 MG tablets in half and taking 10 MG. Please advise.

## 2024-01-01 NOTE — Telephone Encounter (Signed)
    He was on 20 mg daily at the time of his office visit several months ago. If already cutting this in half, that is the lowest dose. Would recommend taking Lasix  in the morning to help with his nocturia. He has been on Lasix  for lymphedema. He could try taking Lasix  10 mg every other day to see if this helps with symptoms but if his weight increases by 2 pounds overnight, 5 pounds in 1 week or he develops worsening edema, will need to go back to daily dosing.   Signed, Laymon CHRISTELLA Qua, PA-C 01/01/2024, 8:52 PM Pager: [phone]

## 2024-01-01 NOTE — Telephone Encounter (Signed)
 Spoke to pt's wife who stated that pt is getting frustrated in regards to his Lasix . Pt is taking 10 mg (splitting 20 mg tablet) and is up and down all night long urinating. Advised pt's wife that this specific medication is designed to get fluid off of a person's body by urination. Advised that pt may be taking medication too late in the evening and that may be causing him to urinate more frequently at night. Advised to take furosemide  earlier in the day to see if it helps.   Pt's wife also commented that pt has lost more weight and she believes it is d/t medication as pt has 'a good appetite'. Pt's wife stated that pt weighs 255 lb.   Please advise.

## 2024-01-02 DIAGNOSIS — I83892 Varicose veins of left lower extremities with other complications: Secondary | ICD-10-CM | POA: Diagnosis not present

## 2024-01-02 NOTE — Telephone Encounter (Signed)
 Left providers message on her phone.

## 2024-01-04 ENCOUNTER — Encounter (HOSPITAL_COMMUNITY): Admission: RE | Admit: 2024-01-04 | Discharge: 2024-01-04 | Attending: Internal Medicine

## 2024-01-04 ENCOUNTER — Encounter (HOSPITAL_COMMUNITY): Payer: Self-pay

## 2024-01-04 ENCOUNTER — Other Ambulatory Visit: Payer: Self-pay

## 2024-01-05 ENCOUNTER — Telehealth: Payer: Self-pay

## 2024-01-05 ENCOUNTER — Inpatient Hospital Stay

## 2024-01-05 ENCOUNTER — Inpatient Hospital Stay: Attending: Nurse Practitioner

## 2024-01-05 ENCOUNTER — Inpatient Hospital Stay: Admitting: Nurse Practitioner

## 2024-01-05 NOTE — Telephone Encounter (Signed)
 The patient contacted us  to cancel their appointment due to adverse weather conditions.

## 2024-01-06 ENCOUNTER — Emergency Department (HOSPITAL_COMMUNITY)

## 2024-01-06 ENCOUNTER — Encounter (HOSPITAL_COMMUNITY): Payer: Self-pay | Admitting: Emergency Medicine

## 2024-01-06 ENCOUNTER — Inpatient Hospital Stay (HOSPITAL_COMMUNITY)
Admission: EM | Admit: 2024-01-06 | Discharge: 2024-01-08 | DRG: 378 | Disposition: A | Attending: Internal Medicine | Admitting: Internal Medicine

## 2024-01-06 ENCOUNTER — Other Ambulatory Visit: Payer: Self-pay

## 2024-01-06 DIAGNOSIS — G4733 Obstructive sleep apnea (adult) (pediatric): Secondary | ICD-10-CM | POA: Insufficient documentation

## 2024-01-06 DIAGNOSIS — J449 Chronic obstructive pulmonary disease, unspecified: Secondary | ICD-10-CM

## 2024-01-06 DIAGNOSIS — J42 Unspecified chronic bronchitis: Secondary | ICD-10-CM | POA: Diagnosis not present

## 2024-01-06 DIAGNOSIS — I10 Essential (primary) hypertension: Secondary | ICD-10-CM | POA: Diagnosis not present

## 2024-01-06 DIAGNOSIS — N281 Cyst of kidney, acquired: Secondary | ICD-10-CM | POA: Diagnosis not present

## 2024-01-06 DIAGNOSIS — K922 Gastrointestinal hemorrhage, unspecified: Principal | ICD-10-CM | POA: Diagnosis present

## 2024-01-06 DIAGNOSIS — K921 Melena: Secondary | ICD-10-CM

## 2024-01-06 DIAGNOSIS — E279 Disorder of adrenal gland, unspecified: Secondary | ICD-10-CM | POA: Diagnosis not present

## 2024-01-06 DIAGNOSIS — I482 Chronic atrial fibrillation, unspecified: Secondary | ICD-10-CM

## 2024-01-06 DIAGNOSIS — N323 Diverticulum of bladder: Secondary | ICD-10-CM | POA: Diagnosis not present

## 2024-01-06 DIAGNOSIS — K5730 Diverticulosis of large intestine without perforation or abscess without bleeding: Secondary | ICD-10-CM | POA: Diagnosis not present

## 2024-01-06 DIAGNOSIS — Z8711 Personal history of peptic ulcer disease: Secondary | ICD-10-CM | POA: Diagnosis not present

## 2024-01-06 DIAGNOSIS — K254 Chronic or unspecified gastric ulcer with hemorrhage: Secondary | ICD-10-CM | POA: Diagnosis not present

## 2024-01-06 DIAGNOSIS — J209 Acute bronchitis, unspecified: Secondary | ICD-10-CM | POA: Diagnosis not present

## 2024-01-06 DIAGNOSIS — M1A9XX0 Chronic gout, unspecified, without tophus (tophi): Secondary | ICD-10-CM | POA: Diagnosis not present

## 2024-01-06 DIAGNOSIS — M109 Gout, unspecified: Secondary | ICD-10-CM | POA: Diagnosis present

## 2024-01-06 DIAGNOSIS — E66812 Obesity, class 2: Secondary | ICD-10-CM

## 2024-01-06 HISTORY — DX: Gastrointestinal hemorrhage, unspecified: K92.2

## 2024-01-06 LAB — CBC
HCT: 35.3 % — ABNORMAL LOW (ref 39.0–52.0)
Hemoglobin: 11.2 g/dL — ABNORMAL LOW (ref 13.0–17.0)
MCH: 26.7 pg (ref 26.0–34.0)
MCHC: 31.7 g/dL (ref 30.0–36.0)
MCV: 84 fL (ref 80.0–100.0)
Platelets: 246 K/uL (ref 150–400)
RBC: 4.2 MIL/uL — ABNORMAL LOW (ref 4.22–5.81)
RDW: 15.3 % (ref 11.5–15.5)
WBC: 7.2 K/uL (ref 4.0–10.5)
nRBC: 0 % (ref 0.0–0.2)

## 2024-01-06 LAB — PROTIME-INR
INR: 1.2 (ref 0.8–1.2)
Prothrombin Time: 16.3 s — ABNORMAL HIGH (ref 11.4–15.2)

## 2024-01-06 LAB — COMPREHENSIVE METABOLIC PANEL WITH GFR
ALT: 9 U/L (ref 0–44)
AST: 15 U/L (ref 15–41)
Albumin: 3.7 g/dL (ref 3.5–5.0)
Alkaline Phosphatase: 65 U/L (ref 38–126)
Anion gap: 7 (ref 5–15)
BUN: 18 mg/dL (ref 8–23)
CO2: 28 mmol/L (ref 22–32)
Calcium: 9 mg/dL (ref 8.9–10.3)
Chloride: 102 mmol/L (ref 98–111)
Creatinine, Ser: 0.68 mg/dL (ref 0.61–1.24)
GFR, Estimated: >60 mL/min (ref >60)
Glucose, Bld: 91 mg/dL (ref 70–99)
Potassium: 4.1 mmol/L (ref 3.5–5.1)
Sodium: 137 mmol/L (ref 135–145)
Total Bilirubin: 0.5 mg/dL (ref 0.0–1.2)
Total Protein: 6.8 g/dL (ref 6.5–8.1)

## 2024-01-06 LAB — TYPE AND SCREEN
ABO/RH(D): O NEG
Antibody Screen: NEGATIVE

## 2024-01-06 LAB — POC OCCULT BLOOD, ED: Fecal Occult Blood: POSITIVE — AB

## 2024-01-06 MED ORDER — FOLIC ACID 1 MG PO TABS
1.0000 mg | ORAL_TABLET | Freq: Every day | ORAL | Status: DC
  Administered 2024-01-07 – 2024-01-08 (×2): 1 mg via ORAL
  Filled 2024-01-06 (×2): qty 1

## 2024-01-06 MED ORDER — PANTOPRAZOLE SODIUM 40 MG PO TBEC: 40.0000 mg | DELAYED_RELEASE_TABLET | Freq: Two times a day (BID) | ORAL | Status: DC

## 2024-01-06 MED ORDER — ACETAMINOPHEN 650 MG RE SUPP: 650.0000 mg | Freq: Four times a day (QID) | RECTAL | Status: DC | PRN

## 2024-01-06 MED ORDER — SODIUM CHLORIDE 0.9 % IV SOLN: INTRAVENOUS | Status: AC

## 2024-01-06 MED ORDER — PANTOPRAZOLE SODIUM 40 MG IV SOLR
40.0000 mg | Freq: Once | INTRAVENOUS | Status: AC
  Administered 2024-01-06: 40 mg via INTRAVENOUS
  Filled 2024-01-06: qty 10

## 2024-01-06 MED ORDER — MAGNESIUM HYDROXIDE 400 MG/5ML PO SUSP: 30.0000 mL | Freq: Every day | ORAL | Status: DC | PRN

## 2024-01-06 MED ORDER — ONDANSETRON HCL 4 MG/2ML IJ SOLN: 4.0000 mg | Freq: Four times a day (QID) | INTRAMUSCULAR | Status: DC | PRN

## 2024-01-06 MED ORDER — ALLOPURINOL 100 MG PO TABS
100.0000 mg | ORAL_TABLET | Freq: Every day | ORAL | Status: DC
  Administered 2024-01-07 – 2024-01-08 (×2): 100 mg via ORAL
  Filled 2024-01-06 (×2): qty 1

## 2024-01-06 MED ORDER — VITAMIN B-12 100 MCG PO TABS
100.0000 ug | ORAL_TABLET | Freq: Every day | ORAL | Status: DC
  Administered 2024-01-07 – 2024-01-08 (×2): 100 ug via ORAL
  Filled 2024-01-06 (×2): qty 1

## 2024-01-06 MED ORDER — HYDROCODONE BIT-HOMATROP MBR 5-1.5 MG/5ML PO SOLN
5.0000 mL | Freq: Four times a day (QID) | ORAL | Status: DC | PRN
  Administered 2024-01-07: 5 mL via ORAL
  Filled 2024-01-06: qty 5

## 2024-01-06 MED ORDER — ONDANSETRON HCL 4 MG PO TABS: 4.0000 mg | ORAL_TABLET | Freq: Four times a day (QID) | ORAL | Status: DC | PRN

## 2024-01-06 MED ORDER — SODIUM CHLORIDE 0.9 % IV BOLUS
1000.0000 mL | Freq: Once | INTRAVENOUS | Status: AC
  Administered 2024-01-06: 1000 mL via INTRAVENOUS

## 2024-01-06 MED ORDER — TRAZODONE HCL 50 MG PO TABS
25.0000 mg | ORAL_TABLET | Freq: Every evening | ORAL | Status: DC | PRN
  Administered 2024-01-06 – 2024-01-07 (×2): 25 mg via ORAL
  Filled 2024-01-06 (×2): qty 1

## 2024-01-06 MED ORDER — ACETAMINOPHEN 325 MG PO TABS: 650.0000 mg | ORAL_TABLET | Freq: Four times a day (QID) | ORAL | Status: DC | PRN

## 2024-01-06 MED ORDER — IOHEXOL 350 MG/ML SOLN
100.0000 mL | Freq: Once | INTRAVENOUS | Status: AC | PRN
  Administered 2024-01-06: 100 mL via INTRAVENOUS

## 2024-01-06 MED ORDER — VITAMIN D 25 MCG (1000 UNIT) PO TABS
1000.0000 [IU] | ORAL_TABLET | Freq: Every day | ORAL | Status: DC
  Administered 2024-01-07 – 2024-01-08 (×2): 1000 [IU] via ORAL
  Filled 2024-01-06 (×2): qty 1

## 2024-01-06 MED ORDER — BUDESON-GLYCOPYRROL-FORMOTEROL 160-9-4.8 MCG/ACT IN AERO
2.0000 | INHALATION_SPRAY | Freq: Two times a day (BID) | RESPIRATORY_TRACT | Status: DC
  Administered 2024-01-07 – 2024-01-08 (×3): 2 via RESPIRATORY_TRACT
  Filled 2024-01-06 (×2): qty 5.9

## 2024-01-06 MED ORDER — THIAMINE MONONITRATE 100 MG PO TABS
100.0000 mg | ORAL_TABLET | Freq: Every day | ORAL | Status: DC
  Administered 2024-01-07 – 2024-01-08 (×2): 100 mg via ORAL
  Filled 2024-01-06 (×2): qty 1

## 2024-01-06 MED ORDER — METOPROLOL TARTRATE 25 MG PO TABS
25.0000 mg | ORAL_TABLET | Freq: Two times a day (BID) | ORAL | Status: DC
  Administered 2024-01-06 – 2024-01-08 (×4): 25 mg via ORAL
  Filled 2024-01-06 (×4): qty 1

## 2024-01-06 MED ORDER — PANTOPRAZOLE SODIUM 40 MG IV SOLR
40.0000 mg | Freq: Two times a day (BID) | INTRAVENOUS | Status: DC
  Administered 2024-01-07 – 2024-01-08 (×3): 40 mg via INTRAVENOUS
  Filled 2024-01-06 (×3): qty 10

## 2024-01-06 MED ORDER — IPRATROPIUM-ALBUTEROL 0.5-2.5 (3) MG/3ML IN SOLN: 3.0000 mL | Freq: Four times a day (QID) | RESPIRATORY_TRACT | Status: DC | PRN

## 2024-01-06 NOTE — H&P (Incomplete)
 [place]   PATIENT NAME: Raymond Hurst    MR#:  [id number]  DATE OF BIRTH:  1951-01-28  DATE OF ADMISSION:  01/06/2024  PRIMARY CARE PHYSICIAN: [name], MD   Patient is coming from: Home  REQUESTING/REFERRING PHYSICIAN: Neysa Thersia RAMAN, PA-C   CHIEF COMPLAINT:   Chief Complaint  Patient presents with   Blood In Stools    HISTORY OF PRESENT ILLNESS:  Raymond Hurst is a 73 y.o. Caucasian male with medical history significant for COPD, atrial fibrillation on Eliquis , OSA on CPAP and CVA, who presented to the ER with acute onset of melena as well as blood clots with a bowel movement this evening.  No nausea or vomiting or heartburn.  No fever or chills.  No chest pain or palpitations.  He has been having cough productive of clear sputum for about a month with no wheezing or dyspnea.  No dysuria, oliguria or hematuria or flank pain.  No other bleeding diathesis.  ED Course: When the patient came to the ER, vital signs were within normal.  Labs were  within normal.  CBC showed hemoglobin 11.2 and hematocrit 3 35.3.  INR was 1.2 with PT 16.3.  Blood group was all negative with negative antibody screen.  Occult blood came back positive. EKG as reviewed by me : EKG showed atrial fibrillation with controlled ventricular sponsor of 95 with left axis deviation with poor R wave progression and borderline prolonged QT interval with QTc of 479 MS.. Imaging: None.  The patient was given 40 g of IV Protonix  and 1 L bolus of IV normal saline.  He will be admitted to a medical telemetry bed for further evaluation and management. PAST MEDICAL HISTORY:   Past Medical History:  Diagnosis Date   Adrenal tumor    right adrenal gland tumor favored to be a myelolipoma   Atrial fibrillation (HCC)    Cellulitis of right lower leg    COPD (chronic obstructive pulmonary disease) (HCC)    Dysrhythmia    A-fib   GI bleed    History of TIA (transient ischemic attack)    May 2020    Lymphedema    OSA on CPAP    severe obstructive sleep apnea with an AHI of 54/h with no significant central events.  He had nocturnal hypoxemia with O2 sat nadir of 70%. Now on CPAP   Pulmonary mass    Stroke [facility])     PAST SURGICAL HISTORY:   Past Surgical History:  Procedure Laterality Date   BRONCHIAL BIOPSY  06/12/2023   Procedure: BRONCHOSCOPY, WITH BIOPSY;  Surgeon: Shelah Lamar RAMAN, MD;  Location: [facility] ENDOSCOPY;  Service: Cardiopulmonary;;   BRONCHIAL BRUSHINGS  06/12/2023   Procedure: BRONCHOSCOPY, WITH BRUSH BIOPSY;  Surgeon: Shelah Lamar RAMAN, MD;  Location: [facility] ENDOSCOPY;  Service: Cardiopulmonary;;   BRONCHIAL NEEDLE ASPIRATION BIOPSY  06/12/2023   Procedure: BRONCHOSCOPY, WITH NEEDLE ASPIRATION BIOPSY;  Surgeon: Shelah Lamar RAMAN, MD;  Location: [facility] ENDOSCOPY;  Service: Cardiopulmonary;;   BRONCHIAL NEEDLE ASPIRATION BIOPSY  08/28/2023   Procedure: BRONCHOSCOPY, WITH NEEDLE ASPIRATION BIOPSY;  Surgeon: Shelah Lamar RAMAN, MD;  Location: [facility] ENDOSCOPY;  Service: Pulmonary;;   CRYOTHERAPY  08/28/2023   Procedure: CRYOTHERAPY;  Surgeon: Shelah Lamar RAMAN, MD;  Location: [facility] ENDOSCOPY;  Service: Pulmonary;;   ESOPHAGOGASTRODUODENOSCOPY N/A 09/09/2023   Procedure: EGD (ESOPHAGOGASTRODUODENOSCOPY);  Surgeon: Shaaron Lamar HERO, MD;  Location: [facility] ENDO SUITE;  Service: Endoscopy;  Laterality: N/A;   HEMOSTASIS CLIP PLACEMENT  06/12/2023   Procedure: CONTROL OF HEMORRHAGE bronch cold saline and epi;  Surgeon: Shelah Lamar RAMAN, MD;  Location: [facility] ENDOSCOPY;  Service: Cardiopulmonary;;   INNER EAR SURGERY     IR ANGIOGRAM SELECTIVE EACH ADDITIONAL VESSEL  09/09/2023   IR ANGIOGRAM VISCERAL SELECTIVE  09/09/2023   IR ANGIOGRAM VISCERAL SELECTIVE  09/09/2023   IR EMBO ART  VEN HEMORR LYMPH EXTRAV  INC GUIDE ROADMAPPING  09/09/2023   IR THORACENTESIS ASP PLEURAL SPACE W/IMG GUIDE  09/26/2023   IR US  GUIDE VASC ACCESS RIGHT  09/09/2023   KNEE SURGERY Left    THORACENTESIS Left 06/06/2023   Procedure: THORACENTESIS;   Surgeon: Arlinda Ranks, MD;  Location: [facility] ENDOSCOPY;  Service: Pulmonary;  Laterality: Left;   TONSILLECTOMY     VIDEO BRONCHOSCOPY WITH ENDOBRONCHIAL NAVIGATION Left 08/28/2023   Procedure: VIDEO BRONCHOSCOPY WITH ENDOBRONCHIAL NAVIGATION;  Surgeon: Shelah Lamar RAMAN, MD;  Location: [facility] ENDOSCOPY;  Service: Pulmonary;  Laterality: Left;   VIDEO BRONCHOSCOPY WITH ENDOBRONCHIAL ULTRASOUND Left 06/12/2023   Procedure: BRONCHOSCOPY, WITH EBUS;  Surgeon: Shelah Lamar RAMAN, MD;  Location: [facility] ENDOSCOPY;  Service: Cardiopulmonary;  Laterality: Left;   VIDEO BRONCHOSCOPY WITH ENDOBRONCHIAL ULTRASOUND  08/28/2023   Procedure: BRONCHOSCOPY, WITH EBUS;  Surgeon: Shelah Lamar RAMAN, MD;  Location: [facility] ENDOSCOPY;  Service: Pulmonary;;    SOCIAL HISTORY:   Social History   Tobacco Use   Smoking status: Former    Current packs/day: 0.00    Average packs/day: 2.0 packs/day for 20.0 years (40.0 ttl pk-yrs)    Types: Cigarettes    Start date: 01/31/1970    Quit date: 01/31/1990    Years since quitting: 33.9   Smokeless tobacco: Never  Substance Use Topics   Alcohol use: Not Currently    Comment: 4-5 beer daily    FAMILY HISTORY:   Family History  Problem Relation Age of Onset   Heart failure Mother    COPD Mother    Atrial fibrillation Mother    Idiopathic pulmonary fibrosis Father     DRUG ALLERGIES:  No Known Allergies  REVIEW OF SYSTEMS:   ROS As per history of present illness. All pertinent systems were reviewed above. Constitutional, HEENT, cardiovascular, respiratory, GI, GU, musculoskeletal, neuro, psychiatric, endocrine, integumentary and hematologic systems were reviewed and are otherwise negative/unremarkable except for positive findings mentioned above in the HPI.   MEDICATIONS AT HOME:   Prior to Admission medications   Medication Sig Start Date End Date Taking? Authorizing Provider  acetaminophen  (TYLENOL ) 500 MG tablet Take 500 mg by mouth every 6 (six) hours as needed for mild pain.     [provider]  allopurinol  (ZYLOPRIM ) 100 MG tablet Take 100 mg by mouth daily. 05/25/22   [provider]  apixaban  (ELIQUIS ) 5 MG TABS tablet Take 1 tablet (5 mg total) by mouth 2 (two) times daily. Okay to restart this medication on 08/29/2023 08/28/23   Byrum, Robert S, MD  budesonide -glycopyrrolate -formoterol  (BREZTRI  AEROSPHERE) 160-9-4.8 MCG/ACT AERO inhaler Inhale 2 puffs into the lungs in the morning and at bedtime. 10/11/23   Hattar, Zola SAILOR, MD  cholecalciferol  (VITAMIN D3) 25 MCG (1000 UNIT) tablet Take 1,000 Units by mouth daily.    [provider]  cyanocobalamin  (VITAMIN B12) 100 MCG tablet Take 100 mcg by mouth daily.    [provider]  folic acid  (FOLVITE ) 1 MG tablet TAKE 1 TABLET BY MOUTH EVERY DAY 12/07/23   Rourk, Lamar HERO, MD  furosemide  (LASIX ) 40 MG tablet Take 20 mg by  mouth daily.    [provider]  HYDROcodone  bit-homatropine (HYCODAN) 5-1.5 MG/5ML syrup Take 5 mLs by mouth every 6 (six) hours as needed for cough. 07/03/23   Meade Verdon RAMAN, MD  ipratropium-albuterol  (DUONEB) 0.5-2.5 (3) MG/3ML SOLN Take 3 mLs by nebulization every 6 (six) hours as needed. 10/31/23   Hattar, Zola SAILOR, MD  levalbuterol  (XOPENEX  HFA) 45 MCG/ACT inhaler Inhale 1 puff into the lungs every 4 (four) hours as needed for wheezing. 07/26/23 07/25/24  Desai, Nikita S, MD  loperamide (IMODIUM A-D) 2 MG tablet Take 2-4 mg by mouth 4 (four) times daily as needed for diarrhea or loose stools. Patient not taking: Reported on 11/30/2023    [provider]  metoprolol  tartrate (LOPRESSOR ) 25 MG tablet Take 1 tablet (25 mg total) by mouth 2 (two) times daily. 11/17/21   Strader, Laymon HERO, PA-C  pantoprazole  (PROTONIX ) 40 MG tablet Take 1 tablet (40 mg total) by mouth 2 (two) times daily before a meal. 10/17/23   Rourk, Lamar HERO, MD  thiamine  (VITAMIN B-1) 100 MG tablet Take 100 mg by mouth daily.    [provider]      VITAL SIGNS:  Blood  pressure 126/69, pulse 88, temperature (!) 97.4 F (36.3 C), temperature source Oral, resp. rate 18, height 6' (1.829 m), weight 118.2 kg, SpO2 98%.  PHYSICAL EXAMINATION:  Physical Exam  GENERAL:  73 y.o.-year-old Caucasian male patient lying in the bed with no acute distress.  EYES: Pupils equal, round, reactive to light and accommodation. No scleral icterus. Extraocular muscles intact.  HEENT: Head atraumatic, normocephalic. Oropharynx and nasopharynx clear.  NECK:  Supple, no jugular venous distention. No thyroid  enlargement, no tenderness.  LUNGS: Normal breath sounds bilaterally, no wheezing, rales,rhonchi or crepitation. No use of accessory muscles of respiration.  CARDIOVASCULAR: Regular rate and rhythm, S1, S2 normal. No murmurs, rubs, or gallops.  ABDOMEN: Soft, nondistended, nontender. Bowel sounds present. No organomegaly or mass.  EXTREMITIES: No pedal edema, cyanosis, or clubbing.  NEUROLOGIC: Cranial nerves II through XII are intact. Muscle strength 5/5 in all extremities. Sensation intact. Gait not checked.  PSYCHIATRIC: The patient is alert and oriented x 3.  Normal affect and good eye contact. SKIN: No obvious rash, lesion, or ulcer.   LABORATORY PANEL:   CBC Recent Labs  Lab 01/06/24 2034  WBC 7.2  HGB 11.2*  HCT 35.3*  PLT 246   ------------------------------------------------------------------------------------------------------------------  Chemistries  Recent Labs  Lab 01/06/24 2034  NA 137  K 4.1  CL 102  CO2 28  GLUCOSE 91  BUN 18  CREATININE 0.68  CALCIUM  9.0  AST 15  ALT 9  ALKPHOS 65  BILITOT 0.5   ------------------------------------------------------------------------------------------------------------------  Cardiac Enzymes No results for input(s): TROPONINI in the last 168 hours. ------------------------------------------------------------------------------------------------------------------  RADIOLOGY:  CT ANGIO GI  BLEED Result Date: 01/06/2024 EXAM: CTA ABDOMEN AND PELVIS WITH CONTRAST 01/06/2024 09:58:06 PM TECHNIQUE: CTA images of the abdomen and pelvis with intravenous contrast. 100 mL iohexol  (OMNIPAQUE ) 350 MG/ML injection. Three-dimensional MIP/volume rendered formations were performed. Automated exposure control, iterative reconstruction, and/or weight based adjustment of the mA/kV was utilized to reduce the radiation dose to as low as reasonably achievable. COMPARISON: Chest CT 08/26/2023. CLINICAL HISTORY: Melena. FINDINGS: VASCULATURE: GI BLEED: No active extravasation of contrast within the GI tract. AORTA: Coronary artery and aortic atherosclerosis. No acute finding. No abdominal aortic aneurysm. No dissection. CELIAC TRUNK: No acute finding. No occlusion or significant stenosis. SUPERIOR MESENTRIC ARTERY: No acute finding. No occlusion  or significant stenosis. INFERIOR MESENTERIC ARTERY: No acute finding. No occlusion or significant stenosis. RENAL ARTERIES: No acute finding. No occlusion or significant stenosis. ILIAC ARTERIES: No acute finding. No occlusion or significant stenosis. ABDOMEN/PELVIS: LOWER CHEST: Moderate left pleural effusion, similar to prior chest CT. LIVER: The liver is unremarkable. GALLBLADDER AND BILE DUCTS: Gallbladder is unremarkable. No biliary ductal dilatation. SPLEEN: The spleen is unremarkable. PANCREAS: The pancreas is unremarkable. ADRENAL GLANDS: A fat-containing mass in the right adrenal gland measures 6.5 cm, stable since the prior study, compatible with myelolipoma. KIDNEYS, URETERS AND BLADDER: 5.4 cm mid pole right renal cyst. 6.6 cm left lower pole cyst. No stones in the kidneys or ureters. No hydronephrosis. No perinephric or periureteral stranding. Large diverticulum off the left side of the bladder. GI AND BOWEL: Stomach and duodenal sweep demonstrate no acute abnormality. Extensive colonic diverticulosis. No active diverticulitis. Normal appendix. There is no bowel  obstruction. No abnormal bowel wall thickening or distension. REPRODUCTIVE: Extensive prostate calcifications. Prostatomegaly. PERITONEUM AND RETROPERITONEUM: No ascites or free air. LYMPH NODES: No lymphadenopathy. BONES AND SOFT TISSUES: No acute abnormality of the bones. No acute soft tissue abnormality. IMPRESSION: 1. No active GI bleeding. 2. Aortic atherosclerosis. 3. Extensive colonic diverticulosis without evidence of diverticulitis. 4. Moderate left pleural effusion, similar to prior chest CT. Electronically signed by: Franky Crease MD 01/06/2024 10:16 PM EST RP Workstation: [id number]      IMPRESSION AND PLAN:  Assessment and Plan: * GI bleeding - The patient will be admitted to a telemetry bed. - Will follow serial hemoglobins and hematocrits. - The patient will be placed on IV Protonix . - He will be hydrated with IV normal saline. - Will stop Eliquis . - GI consult will be obtained. - Dr. Eartha was notified about the patient.  Acute bronchitis - The patient will be placed on IV Rocephin. - Will add mucolytics. - Will continue bronchodilator therapy as mentioned above.  Atrial fibrillation, chronic (HCC) - Will continue Lopressor  and hold off Eliquis .  Chronic obstructive pulmonary disease (COPD) (HCC) - Will continue bronchodilator therapy.  Gout - Will continue allopurinol .  OSA (obstructive sleep apnea) Will resume CPAP nightly.   DVT prophylaxis: SCDs.  Medical prophylaxis is contraindicated due to GI bleeding. Advanced Care Planning:  Code Status: full code. Family Communication:  The plan of care was discussed in details with the patient (and family). I answered all questions. The patient agreed to proceed with the above mentioned plan. Further management will depend upon hospital course. Disposition Plan: Back to previous home environment Consults called: GI consult All the records are reviewed and case discussed with ED provider.  Status is:  Inpatient  At the time of the admission, it appears that the appropriate admission status for this patient is inpatient.  This is judged to be reasonable and necessary in order to provide the required intensity of service to ensure the patient's safety given the presenting symptoms, physical exam findings and initial radiographic and laboratory data in the context of comorbid conditions.  The patient requires inpatient status due to high intensity of service, high risk of further deterioration and high frequency of surveillance required.  I certify that at the time of admission, it is my clinical judgment that the patient will require inpatient hospital care extending more than 2 midnights.                            Dispo: The patient is from: Home  Anticipated d/c is to: Home              Patient currently is not medically stable to d/c.              Difficult to place patient: No  Madison DELENA Peaches M.D on 01/07/2024 at 1:33 AM  [facility]   From 7 PM-7 AM, contact night-coverage [email or web address]  CC: Primary care physician; [name], MD

## 2024-01-06 NOTE — ED Provider Notes (Signed)
 [place] [facility] DEPARTMENT AT [facility] Provider Note   CSN: [id number] Arrival date & time: 01/06/24  2020     Patient presents with: Blood In Stools   Raymond Hurst is a 73 y.o. male.  Patient is a 73 year old male with a history of A-fib on Eliquis  and current lung cancer who presents to the ED for the potential GI bleed that began earlier this evening.  Patient notes he was at the grocery store when he felt the need to have a bowel movement.  He states he used the restroom and had a black bowel movement.  He noticed some dark clots in the toilet as well.  He states he has had continued bleeding on his underwear as well.  He states he has had a GI bleed previously and is seen by Dr. Shaaron with GI.  He is scheduled for an EGD this coming Wednesday.  He notes he currently is on immunotherapy treatments for lung cancer as well.  Denies fevers, nausea/vomiting, abdominal pain, urinary symptoms.  No further complaints.   HPI     Prior to Admission medications   Medication Sig Start Date End Date Taking? Authorizing Provider  acetaminophen  (TYLENOL ) 500 MG tablet Take 500 mg by mouth every 6 (six) hours as needed for mild pain.    [provider]  allopurinol  (ZYLOPRIM ) 100 MG tablet Take 100 mg by mouth daily. 05/25/22   [provider]  apixaban  (ELIQUIS ) 5 MG TABS tablet Take 1 tablet (5 mg total) by mouth 2 (two) times daily. Okay to restart this medication on 08/29/2023 08/28/23   Byrum, Robert S, MD  budesonide -glycopyrrolate -formoterol  (BREZTRI  AEROSPHERE) 160-9-4.8 MCG/ACT AERO inhaler Inhale 2 puffs into the lungs in the morning and at bedtime. 10/11/23   Hattar, Zola SAILOR, MD  cholecalciferol  (VITAMIN D3) 25 MCG (1000 UNIT) tablet Take 1,000 Units by mouth daily.    [provider]  cyanocobalamin  (VITAMIN B12) 100 MCG tablet Take 100 mcg by mouth daily.    [provider]  folic acid  (FOLVITE ) 1 MG tablet TAKE 1 TABLET BY MOUTH EVERY  DAY 12/07/23   Rourk, Lamar HERO, MD  furosemide  (LASIX ) 40 MG tablet Take 20 mg by mouth daily.    [provider]  HYDROcodone  bit-homatropine (HYCODAN) 5-1.5 MG/5ML syrup Take 5 mLs by mouth every 6 (six) hours as needed for cough. 07/03/23   Meade Verdon RAMAN, MD  ipratropium-albuterol  (DUONEB) 0.5-2.5 (3) MG/3ML SOLN Take 3 mLs by nebulization every 6 (six) hours as needed. 10/31/23   Hattar, Zola SAILOR, MD  levalbuterol  (XOPENEX  HFA) 45 MCG/ACT inhaler Inhale 1 puff into the lungs every 4 (four) hours as needed for wheezing. 07/26/23 07/25/24  Desai, Nikita S, MD  loperamide (IMODIUM A-D) 2 MG tablet Take 2-4 mg by mouth 4 (four) times daily as needed for diarrhea or loose stools. Patient not taking: Reported on 11/30/2023    [provider]  metoprolol  tartrate (LOPRESSOR ) 25 MG tablet Take 1 tablet (25 mg total) by mouth 2 (two) times daily. 11/17/21   Strader, Laymon HERO, PA-C  pantoprazole  (PROTONIX ) 40 MG tablet Take 1 tablet (40 mg total) by mouth 2 (two) times daily before a meal. 10/17/23   Rourk, Lamar HERO, MD  thiamine  (VITAMIN B-1) 100 MG tablet Take 100 mg by mouth daily.    [provider]    Allergies: Patient has no known allergies.    Review of Systems  Constitutional:  Negative for fever.  Respiratory:  Negative for shortness of breath.   Cardiovascular:  Negative for chest pain.  Gastrointestinal:  Positive for blood in stool. Negative for abdominal pain, nausea and vomiting.  All other systems reviewed and are negative.   Updated Vital Signs BP 126/69 (BP Location: Right Arm)   Pulse 88   Temp (!) 97.4 F (36.3 C) (Oral)   Resp 18   Ht 6' (1.829 m)   Wt 118.2 kg   SpO2 98%   BMI 35.34 kg/m   Physical Exam Constitutional:      Appearance: Normal appearance.  HENT:     Head: Normocephalic and atraumatic.     Nose: Nose normal.     Mouth/Throat:     Mouth: Mucous membranes are moist.     Pharynx: Oropharynx is clear.  Cardiovascular:      Rate and Rhythm: Normal rate.  Pulmonary:     Effort: Pulmonary effort is normal.  Abdominal:     General: Bowel sounds are normal.     Palpations: Abdomen is soft.     Tenderness: There is no abdominal tenderness.  Genitourinary:    Comments: Gross dark stool with some blood noted on rectal exam.  No obvious fissures Neurological:     Mental Status: He is alert and oriented to person, place, and time.  Psychiatric:        Mood and Affect: Mood normal.        Behavior: Behavior normal.     (all labs ordered are listed, but only abnormal results are displayed) Labs Reviewed  CBC - Abnormal; Notable for the following components:      Result Value   RBC 4.20 (*)    Hemoglobin 11.2 (*)    HCT 35.3 (*)    All other components within normal limits  PROTIME-INR - Abnormal; Notable for the following components:   Prothrombin Time 16.3 (*)    All other components within normal limits  POC OCCULT BLOOD, ED - Abnormal; Notable for the following components:   Fecal Occult Blood Positive (*)    All other components within normal limits  COMPREHENSIVE METABOLIC PANEL WITH GFR  BASIC METABOLIC PANEL WITH GFR  CBC  HEMOGLOBIN AND HEMATOCRIT, BLOOD  HEMOGLOBIN AND HEMATOCRIT, BLOOD  HEMOGLOBIN AND HEMATOCRIT, BLOOD  TYPE AND SCREEN    EKG: None  Radiology: CT ANGIO GI BLEED Result Date: 01/06/2024 EXAM: CTA ABDOMEN AND PELVIS WITH CONTRAST 01/06/2024 09:58:06 PM TECHNIQUE: CTA images of the abdomen and pelvis with intravenous contrast. 100 mL iohexol  (OMNIPAQUE ) 350 MG/ML injection. Three-dimensional MIP/volume rendered formations were performed. Automated exposure control, iterative reconstruction, and/or weight based adjustment of the mA/kV was utilized to reduce the radiation dose to as low as reasonably achievable. COMPARISON: Chest CT 08/26/2023. CLINICAL HISTORY: Melena. FINDINGS: VASCULATURE: GI BLEED: No active extravasation of contrast within the GI tract. AORTA: Coronary artery  and aortic atherosclerosis. No acute finding. No abdominal aortic aneurysm. No dissection. CELIAC TRUNK: No acute finding. No occlusion or significant stenosis. SUPERIOR MESENTRIC ARTERY: No acute finding. No occlusion or significant stenosis. INFERIOR MESENTERIC ARTERY: No acute finding. No occlusion or significant stenosis. RENAL ARTERIES: No acute finding. No occlusion or significant stenosis. ILIAC ARTERIES: No acute finding. No occlusion or significant stenosis. ABDOMEN/PELVIS: LOWER CHEST: Moderate left pleural effusion, similar to prior chest CT. LIVER: The liver is unremarkable. GALLBLADDER AND BILE DUCTS: Gallbladder is unremarkable. No biliary ductal dilatation. SPLEEN: The spleen is unremarkable. PANCREAS: The pancreas is unremarkable. ADRENAL GLANDS: A fat-containing mass in the right  adrenal gland measures 6.5 cm, stable since the prior study, compatible with myelolipoma. KIDNEYS, URETERS AND BLADDER: 5.4 cm mid pole right renal cyst. 6.6 cm left lower pole cyst. No stones in the kidneys or ureters. No hydronephrosis. No perinephric or periureteral stranding. Large diverticulum off the left side of the bladder. GI AND BOWEL: Stomach and duodenal sweep demonstrate no acute abnormality. Extensive colonic diverticulosis. No active diverticulitis. Normal appendix. There is no bowel obstruction. No abnormal bowel wall thickening or distension. REPRODUCTIVE: Extensive prostate calcifications. Prostatomegaly. PERITONEUM AND RETROPERITONEUM: No ascites or free air. LYMPH NODES: No lymphadenopathy. BONES AND SOFT TISSUES: No acute abnormality of the bones. No acute soft tissue abnormality. IMPRESSION: 1. No active GI bleeding. 2. Aortic atherosclerosis. 3. Extensive colonic diverticulosis without evidence of diverticulitis. 4. Moderate left pleural effusion, similar to prior chest CT. Electronically signed by: Franky Crease MD 01/06/2024 10:16 PM EST RP Workstation: [id number]      Medications Ordered in the  ED  allopurinol  (ZYLOPRIM ) tablet 100 mg (has no administration in time range)  metoprolol  tartrate (LOPRESSOR ) tablet 25 mg (25 mg Oral Given 01/06/24 2357)  vitamin B-12 (CYANOCOBALAMIN ) tablet 100 mcg (has no administration in time range)  folic acid  (FOLVITE ) tablet 1 mg (has no administration in time range)  cholecalciferol  (VITAMIN D3) 25 MCG (1000 UNIT) tablet 1,000 Units (has no administration in time range)  thiamine  (VITAMIN B1) tablet 100 mg (has no administration in time range)  budesonide -glycopyrrolate -formoterol  (BREZTRI ) 160-9-4.8 MCG/ACT inhaler 2 puff (has no administration in time range)  HYDROcodone  bit-homatropine (HYCODAN) 5-1.5 MG/5ML syrup 5 mL (has no administration in time range)  ipratropium-albuterol  (DUONEB) 0.5-2.5 (3) MG/3ML nebulizer solution 3 mL (has no administration in time range)  0.9 %  sodium chloride  infusion ( Intravenous New Bag/Given 01/06/24 2353)  acetaminophen  (TYLENOL ) tablet 650 mg (has no administration in time range)    Or  acetaminophen  (TYLENOL ) suppository 650 mg (has no administration in time range)  traZODone  (DESYREL ) tablet 25 mg (25 mg Oral Given 01/06/24 2357)  magnesium  hydroxide (MILK OF MAGNESIA) suspension 30 mL (has no administration in time range)  ondansetron  (ZOFRAN ) tablet 4 mg (has no administration in time range)    Or  ondansetron  (ZOFRAN ) injection 4 mg (has no administration in time range)  pantoprazole  (PROTONIX ) injection 40 mg (has no administration in time range)  sodium chloride  0.9 % bolus 1,000 mL (1,000 mLs Intravenous New Bag/Given 01/06/24 2130)  iohexol  (OMNIPAQUE ) 350 MG/ML injection 100 mL (100 mLs Intravenous Contrast Given 01/06/24 2141)  pantoprazole  (PROTONIX ) injection 40 mg (40 mg Intravenous Given 01/06/24 2227)                                   Medical Decision Making Patient is a 73 year old male with a current history of lung cancer on immunotherapy as well as A-fib on Eliquis  who presents to the ED  for acute episode of dark stool this evening as well as dark clots in the toilet.  Please see detailed HPI above.  On exam patient is alert and in no acute distress.  Physical exam as noted above.  No abdominal tenderness appreciated.  Initial hemoglobin stable at 11.2 at this time.  Occult stool is positive.  CT angio for GI bleed reviewed and is negative for acute process today.  Patient does note he sees Dr. Shaaron and he was scheduled to have an EGD this coming Wednesday.  However, in  the setting of Eliquis  as well as diffuse melena this evening, there is concerns for acute GI bleed at this time. Dr. Eartha with GI was consulted who did agree with admission this evening.  He advised to place patient on Protonix , keep clear liquid diet, hold Eliquis , and they will most likely scope patient on Monday.  Hospitalist has been consulted and are agreeable to admit patient for further evaluation and management.  Patient given Protonix  and a liter of fluids while in ED.  Patient stable while in ED.  Amount and/or Complexity of Data Reviewed Labs: ordered. Radiology: ordered.  Risk Prescription drug management. Decision regarding hospitalization.       Final diagnoses:  Gastrointestinal hemorrhage, unspecified gastrointestinal hemorrhage type  Melena    ED Discharge Orders     None          Neysa Thersia GORMAN DEVONNA 01/07/24 0004    Melvenia Motto, MD 01/07/24 0040

## 2024-01-06 NOTE — ED Triage Notes (Addendum)
 Pt states he had BM this evening and his stool was black in color. Pt states he also noticed some clots. Pt on Eliquis .

## 2024-01-07 ENCOUNTER — Telehealth (INDEPENDENT_AMBULATORY_CARE_PROVIDER_SITE_OTHER): Payer: Self-pay | Admitting: Gastroenterology

## 2024-01-07 DIAGNOSIS — K922 Gastrointestinal hemorrhage, unspecified: Secondary | ICD-10-CM | POA: Diagnosis not present

## 2024-01-07 DIAGNOSIS — E66812 Obesity, class 2: Secondary | ICD-10-CM

## 2024-01-07 DIAGNOSIS — G4733 Obstructive sleep apnea (adult) (pediatric): Secondary | ICD-10-CM | POA: Insufficient documentation

## 2024-01-07 DIAGNOSIS — J209 Acute bronchitis, unspecified: Secondary | ICD-10-CM | POA: Diagnosis not present

## 2024-01-07 DIAGNOSIS — I482 Chronic atrial fibrillation, unspecified: Secondary | ICD-10-CM

## 2024-01-07 DIAGNOSIS — J449 Chronic obstructive pulmonary disease, unspecified: Secondary | ICD-10-CM | POA: Diagnosis not present

## 2024-01-07 DIAGNOSIS — M1A9XX0 Chronic gout, unspecified, without tophus (tophi): Secondary | ICD-10-CM

## 2024-01-07 DIAGNOSIS — Z8711 Personal history of peptic ulcer disease: Secondary | ICD-10-CM

## 2024-01-07 LAB — HEMOGLOBIN AND HEMATOCRIT, BLOOD
HCT: 32 % — ABNORMAL LOW (ref 39.0–52.0)
Hemoglobin: 9.9 g/dL — ABNORMAL LOW (ref 13.0–17.0)

## 2024-01-07 LAB — BASIC METABOLIC PANEL WITH GFR
Anion gap: 4 — ABNORMAL LOW (ref 5–15)
BUN: 16 mg/dL (ref 8–23)
CO2: 29 mmol/L (ref 22–32)
Calcium: 8.4 mg/dL — ABNORMAL LOW (ref 8.9–10.3)
Chloride: 105 mmol/L (ref 98–111)
Creatinine, Ser: 0.71 mg/dL (ref 0.61–1.24)
GFR, Estimated: >60 mL/min (ref >60)
Glucose, Bld: 96 mg/dL (ref 70–99)
Potassium: 3.9 mmol/L (ref 3.5–5.1)
Sodium: 138 mmol/L (ref 135–145)

## 2024-01-07 LAB — CBC
HCT: 30 % — ABNORMAL LOW (ref 39.0–52.0)
Hemoglobin: 9.3 g/dL — ABNORMAL LOW (ref 13.0–17.0)
MCH: 26.4 pg (ref 26.0–34.0)
MCHC: 31 g/dL (ref 30.0–36.0)
MCV: 85.2 fL (ref 80.0–100.0)
Platelets: 195 K/uL (ref 150–400)
RBC: 3.52 MIL/uL — ABNORMAL LOW (ref 4.22–5.81)
RDW: 15.5 % (ref 11.5–15.5)
WBC: 5 K/uL (ref 4.0–10.5)
nRBC: 0 % (ref 0.0–0.2)

## 2024-01-07 MED ORDER — GUAIFENESIN ER 600 MG PO TB12
600.0000 mg | ORAL_TABLET | Freq: Two times a day (BID) | ORAL | Status: DC
  Administered 2024-01-07: 600 mg via ORAL
  Filled 2024-01-07 (×2): qty 1

## 2024-01-07 MED ORDER — IPRATROPIUM-ALBUTEROL 0.5-2.5 (3) MG/3ML IN SOLN
3.0000 mL | Freq: Two times a day (BID) | RESPIRATORY_TRACT | Status: DC
  Administered 2024-01-07 – 2024-01-08 (×2): 3 mL via RESPIRATORY_TRACT
  Filled 2024-01-07 (×2): qty 3

## 2024-01-07 MED ORDER — IPRATROPIUM-ALBUTEROL 0.5-2.5 (3) MG/3ML IN SOLN
3.0000 mL | Freq: Four times a day (QID) | RESPIRATORY_TRACT | Status: DC
  Administered 2024-01-07: 3 mL via RESPIRATORY_TRACT

## 2024-01-07 NOTE — Assessment & Plan Note (Addendum)
-   The patient will be admitted to a telemetry bed. - Will follow serial hemoglobins and hematocrits. - The patient will be placed on IV Protonix . - He will be hydrated with IV normal saline. - Will stop Eliquis . - GI consult will be obtained. - Dr. Eartha was notified about the patient.

## 2024-01-07 NOTE — Consult Note (Signed)
 Toribio Fortune, M.D. Gastroenterology & Hepatology                                           Patient Name: Raymond Hurst Account #: @FLAACCTNO @   MRN: [id number] Admission Date: 01/06/2024 Date of Evaluation:  01/07/2024 Time of Evaluation: 12:54 PM   Referring Physician: Eric Nunnery, MD  Chief Complaint: Melena  HPI:  This is a 73 y.o. 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  Patient reports that yesterday night he presented 1 episode of melena.  He denies having any abdominal pain, nausea, vomiting, fever, chills, abdominal distention, hematochezia, diarrhea.  States that his last dose of Eliquis  was yesterday at noon time.  Has not taken any NSAIDs.  Reports that given his history of GI bleed in the past, he was concerned and decided to come to the ER for further evaluation.  Has not had any more melena since then.  Notably, the patient was admitted to [facility] in August 2025 after presenting upper GI bleeding.  Underwent EGD with Dr. Shaaron on 09/09/2023 which showed presence of normal esophagus, blood-tinged fluid in the stomach which was suctioned, presence of 1.5 cm cratered ulcer between D1 and D2 with presence of a Brode based adherent clot suspicious for large arterial branch.  This was injected with epinephrine .  Patient was transferred to [facility] for interventional radiology evaluation and possible embolization.  He underwent coil embolization of the gastroduodenal artery on 09/09/2023.  He has been taking pantoprazole  40 mg twice a day since this hospitalization.  In the ED, he was HD stable and afebrile. Labs  were remarkable for CBC with hemoglobin of 11.2, WBC 7.2, platelets 246, CMP showed normal BUN of 18, all of the CMP was completely normal.  INR was 1.2.  FOBT was positive.  Repeat hemoglobin today in the morning was 9.9.  Past Medical History: SEE CHRONIC ISSSUES: Past Medical History:  Diagnosis Date   Adrenal tumor    right adrenal gland tumor favored to be a myelolipoma   Atrial fibrillation (HCC)    Cellulitis of right lower leg    COPD (chronic obstructive pulmonary disease) (HCC)    Dysrhythmia    A-fib   GI bleed    History of TIA (transient ischemic attack)    May 2020   Lymphedema    OSA on CPAP    severe obstructive sleep apnea with an AHI of 54/h with no significant central events.  He had nocturnal hypoxemia with O2 sat nadir of 70%. Now on CPAP   Pulmonary mass    Stroke [facility])    Past Surgical History:  Past Surgical History:  Procedure Laterality Date   BRONCHIAL BIOPSY  06/12/2023   Procedure: BRONCHOSCOPY, WITH BIOPSY;  Surgeon: Shelah Lamar RAMAN, MD;  Location: [facility] ENDOSCOPY;  Service: Cardiopulmonary;;   BRONCHIAL BRUSHINGS  06/12/2023   Procedure: BRONCHOSCOPY, WITH BRUSH BIOPSY;  Surgeon: Shelah Lamar RAMAN, MD;  Location: [facility] ENDOSCOPY;  Service: Cardiopulmonary;;   BRONCHIAL NEEDLE ASPIRATION BIOPSY  06/12/2023   Procedure: BRONCHOSCOPY, WITH NEEDLE ASPIRATION BIOPSY;  Surgeon:  Shelah Lamar RAMAN, MD;  Location[facility] ENDOSCOPY;  Service: Cardiopulmonary;;   BRONCHIAL NEEDLE ASPIRATION BIOPSY  08/28/2023   Procedure: BRONCHOSCOPY, WITH NEEDLE ASPIRATION BIOPSY;  Surgeon: Shelah Lamar RAMAN, MD;  Location: [facility] ENDOSCOPY;  Service: Pulmonary;;   CRYOTHERAPY  08/28/2023   Procedure: CRYOTHERAPY;  Surgeon: Shelah Lamar RAMAN, MD;  Location: [facility] ENDOSCOPY;  Service: Pulmonary;;   ESOPHAGOGASTRODUODENOSCOPY N/A 09/09/2023   Procedure: EGD (ESOPHAGOGASTRODUODENOSCOPY);  Surgeon: Shaaron Lamar HERO, MD;  Location: [facility] ENDO SUITE;  Service: Endoscopy;  Laterality: N/A;   HEMOSTASIS CLIP PLACEMENT   06/12/2023   Procedure: CONTROL OF HEMORRHAGE bronch cold saline and epi;  Surgeon: Shelah Lamar RAMAN, MD;  Location: [facility] ENDOSCOPY;  Service: Cardiopulmonary;;   INNER EAR SURGERY     IR ANGIOGRAM SELECTIVE EACH ADDITIONAL VESSEL  09/09/2023   IR ANGIOGRAM VISCERAL SELECTIVE  09/09/2023   IR ANGIOGRAM VISCERAL SELECTIVE  09/09/2023   IR EMBO ART  VEN HEMORR LYMPH EXTRAV  INC GUIDE ROADMAPPING  09/09/2023   IR THORACENTESIS ASP PLEURAL SPACE W/IMG GUIDE  09/26/2023   IR US  GUIDE VASC ACCESS RIGHT  09/09/2023   KNEE SURGERY Left    THORACENTESIS Left 06/06/2023   Procedure: THORACENTESIS;  Surgeon: Arlinda Ranks, MD;  Location: [facility] ENDOSCOPY;  Service: Pulmonary;  Laterality: Left;   TONSILLECTOMY     VIDEO BRONCHOSCOPY WITH ENDOBRONCHIAL NAVIGATION Left 08/28/2023   Procedure: VIDEO BRONCHOSCOPY WITH ENDOBRONCHIAL NAVIGATION;  Surgeon: Shelah Lamar RAMAN, MD;  Location: [facility] ENDOSCOPY;  Service: Pulmonary;  Laterality: Left;   VIDEO BRONCHOSCOPY WITH ENDOBRONCHIAL ULTRASOUND Left 06/12/2023   Procedure: BRONCHOSCOPY, WITH EBUS;  Surgeon: Shelah Lamar RAMAN, MD;  Location: [facility] ENDOSCOPY;  Service: Cardiopulmonary;  Laterality: Left;   VIDEO BRONCHOSCOPY WITH ENDOBRONCHIAL ULTRASOUND  08/28/2023   Procedure: BRONCHOSCOPY, WITH EBUS;  Surgeon: Shelah Lamar RAMAN, MD;  Location: [facility] ENDOSCOPY;  Service: Pulmonary;;   Family History:  Family History  Problem Relation Age of Onset   Heart failure Mother    COPD Mother    Atrial fibrillation Mother    Idiopathic pulmonary fibrosis Father    Social History:  Social History   Tobacco Use   Smoking status: Former    Current packs/day: 0.00    Average packs/day: 2.0 packs/day for 20.0 years (40.0 ttl pk-yrs)    Types: Cigarettes    Start date: 01/31/1970    Quit date: 01/31/1990    Years since quitting: 33.9   Smokeless tobacco: Never  Vaping Use   Vaping status: Never Used  Substance Use Topics   Alcohol use: Not Currently    Comment: 4-5 beer daily   Drug use: No     Home Medications:  Prior to Admission medications   Medication Sig Start Date End Date Taking? Authorizing Provider  acetaminophen  (TYLENOL ) 500 MG tablet Take 500 mg by mouth every 6 (six) hours as needed for mild pain.    [provider]  allopurinol  (ZYLOPRIM ) 100 MG tablet Take 100 mg by mouth daily. 05/25/22   [provider]  apixaban  (ELIQUIS ) 5 MG TABS tablet Take 1 tablet (5 mg total) by mouth 2 (two) times daily. Okay to restart this medication on 08/29/2023 08/28/23   Byrum, Robert S, MD  budesonide -glycopyrrolate -formoterol  (BREZTRI  AEROSPHERE) 160-9-4.8 MCG/ACT AERO inhaler Inhale 2 puffs into the lungs in the morning and at bedtime. 10/11/23   Hattar, Zola SAILOR, MD  cholecalciferol  (VITAMIN D3) 25 MCG (1000 UNIT) tablet Take 1,000 Units by mouth daily.    [provider]  cyanocobalamin  (VITAMIN  B12) 100 MCG tablet Take 100 mcg by mouth daily.    [provider]  folic acid  (FOLVITE ) 1 MG tablet TAKE 1 TABLET BY MOUTH EVERY DAY 12/07/23   Rourk, Lamar HERO, MD  furosemide  (LASIX ) 40 MG tablet Take 20 mg by mouth daily.    [provider]  HYDROcodone  bit-homatropine (HYCODAN) 5-1.5 MG/5ML syrup Take 5 mLs by mouth every 6 (six) hours as needed for cough. 07/03/23   Meade Verdon RAMAN, MD  ipratropium-albuterol  (DUONEB) 0.5-2.5 (3) MG/3ML SOLN Take 3 mLs by nebulization every 6 (six) hours as needed. 10/31/23   Hattar, Zola SAILOR, MD  levalbuterol  (XOPENEX  HFA) 45 MCG/ACT inhaler Inhale 1 puff into the lungs every 4 (four) hours as needed for wheezing. 07/26/23 07/25/24  Desai, Nikita S, MD  loperamide (IMODIUM A-D) 2 MG tablet Take 2-4 mg by mouth 4 (four) times daily as needed for diarrhea or loose stools. Patient not taking: Reported on 11/30/2023    [provider]  metoprolol  tartrate (LOPRESSOR ) 25 MG tablet Take 1 tablet (25 mg total) by mouth 2 (two) times daily. 11/17/21   Strader, Laymon HERO, PA-C  pantoprazole  (PROTONIX ) 40 MG tablet  Take 1 tablet (40 mg total) by mouth 2 (two) times daily before a meal. 10/17/23   Rourk, Lamar HERO, MD  thiamine  (VITAMIN B-1) 100 MG tablet Take 100 mg by mouth daily.    [provider]    Inpatient Medications:  Current Facility-Administered Medications:    0.9 %  sodium chloride  infusion, , Intravenous, Continuous, Mansy, Jan A, MD, Last Rate: 100 mL/hr at 01/06/24 2353, New Bag at 01/06/24 2353   acetaminophen  (TYLENOL ) tablet 650 mg, 650 mg, Oral, Q6H PRN **OR** acetaminophen  (TYLENOL ) suppository 650 mg, 650 mg, Rectal, Q6H PRN, Mansy, Jan A, MD   allopurinol  (ZYLOPRIM ) tablet 100 mg, 100 mg, Oral, Daily, Mansy, Jan A, MD, 100 mg at 01/07/24 9048   budesonide -glycopyrrolate -formoterol  (BREZTRI ) 160-9-4.8 MCG/ACT inhaler 2 puff, 2 puff, Inhalation, BID, Mansy, Jan A, MD, 2 puff at 01/07/24 9147   cholecalciferol  (VITAMIN D3) 25 MCG (1000 UNIT) tablet 1,000 Units, 1,000 Units, Oral, Daily, Mansy, Jan A, MD, 1,000 Units at 01/07/24 9048   folic acid  (FOLVITE ) tablet 1 mg, 1 mg, Oral, Daily, Mansy, Jan A, MD, 1 mg at 01/07/24 9048   guaiFENesin  (MUCINEX ) 12 hr tablet 600 mg, 600 mg, Oral, BID, Mansy, Jan A, MD   HYDROcodone  bit-homatropine (HYCODAN) 5-1.5 MG/5ML syrup 5 mL, 5 mL, Oral, Q6H PRN, Mansy, Jan A, MD   ipratropium-albuterol  (DUONEB) 0.5-2.5 (3) MG/3ML nebulizer solution 3 mL, 3 mL, Nebulization, Q6H PRN, Mansy, Jan A, MD   ipratropium-albuterol  (DUONEB) 0.5-2.5 (3) MG/3ML nebulizer solution 3 mL, 3 mL, Nebulization, BID, Ricky Fines, MD   magnesium  hydroxide (MILK OF MAGNESIA) suspension 30 mL, 30 mL, Oral, Daily PRN, Mansy, Jan A, MD   metoprolol  tartrate (LOPRESSOR ) tablet 25 mg, 25 mg, Oral, BID, Mansy, Jan A, MD, 25 mg at 01/07/24 9048   ondansetron  (ZOFRAN ) tablet 4 mg, 4 mg, Oral, Q6H PRN **OR** ondansetron  (ZOFRAN ) injection 4 mg, 4 mg, Intravenous, Q6H PRN, Mansy, Jan A, MD   pantoprazole  (PROTONIX ) injection 40 mg, 40 mg, Intravenous, Q12H, Mansy, Jan A, MD, 40 mg  at 01/07/24 9048   thiamine  (VITAMIN B1) tablet 100 mg, 100 mg, Oral, Daily, Mansy, Jan A, MD, 100 mg at 01/07/24 9048   traZODone  (DESYREL ) tablet 25 mg, 25 mg, Oral, QHS PRN, Mansy, Jan A, MD, 25 mg at 01/06/24 2357  vitamin B-12 (CYANOCOBALAMIN ) tablet 100 mcg, 100 mcg, Oral, Daily, Mansy, Jan A, MD, 100 mcg at 01/07/24 9048 Allergies: Patient has no known allergies.  Complete Review of Systems: GENERAL: negative for malaise, night sweats HEENT: No changes in hearing or vision, no nose bleeds or other nasal problems. NECK: Negative for lumps, goiter, pain and significant neck swelling RESPIRATORY: Negative for cough, wheezing CARDIOVASCULAR: Negative for chest pain, leg swelling, palpitations, orthopnea GI: SEE HPI MUSCULOSKELETAL: Negative for joint pain or swelling, back pain, and muscle pain. SKIN: Negative for lesions, rash PSYCH: Negative for sleep disturbance, mood disorder and recent psychosocial stressors. HEMATOLOGY Negative for prolonged bleeding, bruising easily, and swollen nodes. ENDOCRINE: Negative for cold or heat intolerance, polyuria, polydipsia and goiter. NEURO: negative for tremor, gait imbalance, syncope and seizures. The remainder of the review of systems is noncontributory.  Physical Exam: BP 115/69 (BP Location: Right Arm)   Pulse 76   Temp 97.6 F (36.4 C) (Oral)   Resp 18   Ht 6' (1.829 m)   Wt 118.2 kg   SpO2 99%   BMI 35.34 kg/m  GENERAL: The patient is AO x3, in no acute distress. HEENT: Head is normocephalic and atraumatic. EOMI are intact. Mouth is well hydrated and without lesions. NECK: Supple. No masses LUNGS: Clear to auscultation. No presence of rhonchi/wheezing/rales. Adequate chest expansion HEART: RRR, normal s1 and s2. ABDOMEN: Soft, nontender, no guarding, no peritoneal signs, and nondistended. BS +. No masses. EXTREMITIES: Without any cyanosis, clubbing, rash, lesions or edema. NEUROLOGIC: AOx3, no focal motor deficit. SKIN: no  jaundice, no rashes  Laboratory Data CBC:     Component Value Date/Time   WBC 5.0 01/07/2024 0434   RBC 3.52 (L) 01/07/2024 0434   HGB 9.9 (L) 01/07/2024 1100   HGB 11.3 (L) 11/30/2023 1032   HGB 11.4 (L) 06/01/2023 1439   HCT 32.0 (L) 01/07/2024 1100   HCT 36.9 (L) 06/01/2023 1439   PLT 195 01/07/2024 0434   PLT 182 11/30/2023 1032   PLT 231 06/01/2023 1439   MCV 85.2 01/07/2024 0434   MCV 84 06/01/2023 1439   MCH 26.4 01/07/2024 0434   MCHC 31.0 01/07/2024 0434   RDW 15.5 01/07/2024 0434   RDW 16.8 (H) 06/01/2023 1439   LYMPHSABS 0.4 (L) 11/30/2023 1032   LYMPHSABS 1.0 06/01/2023 1439   MONOABS 0.5 11/30/2023 1032   EOSABS 0.2 11/30/2023 1032   EOSABS 0.2 06/01/2023 1439   BASOSABS 0.0 11/30/2023 1032   BASOSABS 0.0 06/01/2023 1439   COAG:  Lab Results  Component Value Date   INR 1.2 01/06/2024   INR 1.0 09/13/2023   INR 1.3 (H) 09/07/2023    BMP:     Latest Ref Rng & Units 01/07/2024    4:34 AM 01/06/2024    8:34 PM 11/30/2023   10:32 AM  BMP  Glucose 70 - 99 mg/dL 96  91  96   BUN 8 - 23 mg/dL 16  18  14    Creatinine 0.61 - 1.24 mg/dL 9.28  9.31  9.13   Sodium 135 - 145 mmol/L 138  137  139   Potassium 3.5 - 5.1 mmol/L 3.9  4.1  3.9   Chloride 98 - 111 mmol/L 105  102  106   CO2 22 - 32 mmol/L 29  28  25    Calcium  8.9 - 10.3 mg/dL 8.4  9.0  9.4     HEPATIC:     Latest Ref Rng & Units 01/06/2024  8:34 PM 11/30/2023   10:32 AM 11/01/2023   10:28 AM  Hepatic Function  Total Protein 6.5 - 8.1 g/dL 6.8  6.6  6.7   Albumin 3.5 - 5.0 g/dL 3.7  3.7  3.7   AST 15 - 41 U/L 15  13  13    ALT 0 - 44 U/L 9  5  8    Alk Phosphatase 38 - 126 U/L 65  69  67   Total Bilirubin 0.0 - 1.2 mg/dL 0.5  0.5  0.4     CARDIAC:  Lab Results  Component Value Date   TROPONINI <0.03 06/08/2018     Imaging: I personally reviewed and interpreted the available imaging.  Assessment & Plan: Raymond Hurst is a 73 y.o. 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  Patient presented 1 isolated episode of melena and has been hemodynamically stable.  He had some drop in his hemoglobin today, although it could be likely related to hemodilution as his BUN has remained low and he has not presented any more episodes of melena.  Given his history of peptic ulcer disease required embolization in the past, he should undergo endoscopic evaluation to evaluate his symptoms further.  This will be performed tomorrow to allow adequate washout of his Eliquis .  For now, we will sinew pantoprazole  40 mg twice daily IV.  Patient should not agree.  - Repeat CBC qday, transfuse if Hb <7 - Pantoprazole   40 mg q12h IVP - 2 large bore IV lines - Active T/S - Clear liquid diet for now, n.p.o. after midnight -Hold Eliquis  - Avoid NSAIDs - Will proceed with EGD tomorrow  Toribio Fortune, MD Gastroenterology and Hepatology [facility] Gastroenterology

## 2024-01-07 NOTE — Telephone Encounter (Signed)
 Hi, Please cancel the upcoming esophagogastroduodenospy with Dr. Shaaron.  Patient will have this performed tomorrow. Thanks

## 2024-01-07 NOTE — Assessment & Plan Note (Signed)
-   Will continue Lopressor  and hold off Eliquis .

## 2024-01-07 NOTE — Assessment & Plan Note (Signed)
-   The patient will be placed on IV Rocephin. - Will add mucolytics. - Will continue bronchodilator therapy as mentioned above.

## 2024-01-07 NOTE — Assessment & Plan Note (Signed)
Will continue allopurinol. 

## 2024-01-07 NOTE — H&P (View-Only) (Signed)
 Toribio Fortune, M.D. Gastroenterology & Hepatology                                           Patient Name: Raymond Hurst Account #: @FLAACCTNO @   MRN: [id number] Admission Date: 01/06/2024 Date of Evaluation:  01/07/2024 Time of Evaluation: 12:54 PM   Referring Physician: Eric Nunnery, MD  Chief Complaint: Melena  HPI:  This is a 73 y.o. 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  Patient reports that yesterday night he presented 1 episode of melena.  He denies having any abdominal pain, nausea, vomiting, fever, chills, abdominal distention, hematochezia, diarrhea.  States that his last dose of Eliquis  was yesterday at noon time.  Has not taken any NSAIDs.  Reports that given his history of GI bleed in the past, he was concerned and decided to come to the ER for further evaluation.  Has not had any more melena since then.  Notably, the patient was admitted to [facility] in August 2025 after presenting upper GI bleeding.  Underwent EGD with Dr. Shaaron on 09/09/2023 which showed presence of normal esophagus, blood-tinged fluid in the stomach which was suctioned, presence of 1.5 cm cratered ulcer between D1 and D2 with presence of a Brode based adherent clot suspicious for large arterial branch.  This was injected with epinephrine .  Patient was transferred to [facility] for interventional radiology evaluation and possible embolization.  He underwent coil embolization of the gastroduodenal artery on 09/09/2023.  He has been taking pantoprazole  40 mg twice a day since this hospitalization.  In the ED, he was HD stable and afebrile. Labs  were remarkable for CBC with hemoglobin of 11.2, WBC 7.2, platelets 246, CMP showed normal BUN of 18, all of the CMP was completely normal.  INR was 1.2.  FOBT was positive.  Repeat hemoglobin today in the morning was 9.9.  Past Medical History: SEE CHRONIC ISSSUES: Past Medical History:  Diagnosis Date   Adrenal tumor    right adrenal gland tumor favored to be a myelolipoma   Atrial fibrillation (HCC)    Cellulitis of right lower leg    COPD (chronic obstructive pulmonary disease) (HCC)    Dysrhythmia    A-fib   GI bleed    History of TIA (transient ischemic attack)    May 2020   Lymphedema    OSA on CPAP    severe obstructive sleep apnea with an AHI of 54/h with no significant central events.  He had nocturnal hypoxemia with O2 sat nadir of 70%. Now on CPAP   Pulmonary mass    Stroke [facility])    Past Surgical History:  Past Surgical History:  Procedure Laterality Date   BRONCHIAL BIOPSY  06/12/2023   Procedure: BRONCHOSCOPY, WITH BIOPSY;  Surgeon: Shelah Lamar RAMAN, MD;  Location: [facility] ENDOSCOPY;  Service: Cardiopulmonary;;   BRONCHIAL BRUSHINGS  06/12/2023   Procedure: BRONCHOSCOPY, WITH BRUSH BIOPSY;  Surgeon: Shelah Lamar RAMAN, MD;  Location: [facility] ENDOSCOPY;  Service: Cardiopulmonary;;   BRONCHIAL NEEDLE ASPIRATION BIOPSY  06/12/2023   Procedure: BRONCHOSCOPY, WITH NEEDLE ASPIRATION BIOPSY;  Surgeon:  Shelah Lamar RAMAN, MD;  Location: [facility] ENDOSCOPY;  Service: Cardiopulmonary;;   BRONCHIAL NEEDLE ASPIRATION BIOPSY  08/28/2023   Procedure: BRONCHOSCOPY, WITH NEEDLE ASPIRATION BIOPSY;  Surgeon: Shelah Lamar RAMAN, MD;  Location: [facility] ENDOSCOPY;  Service: Pulmonary;;   CRYOTHERAPY  08/28/2023   Procedure: CRYOTHERAPY;  Surgeon: Shelah Lamar RAMAN, MD;  Location: [facility] ENDOSCOPY;  Service: Pulmonary;;   ESOPHAGOGASTRODUODENOSCOPY N/A 09/09/2023   Procedure: EGD (ESOPHAGOGASTRODUODENOSCOPY);  Surgeon: Shaaron Lamar HERO, MD;  Location: [facility] ENDO SUITE;  Service: Endoscopy;  Laterality: N/A;   HEMOSTASIS CLIP PLACEMENT   06/12/2023   Procedure: CONTROL OF HEMORRHAGE bronch cold saline and epi;  Surgeon: Shelah Lamar RAMAN, MD;  Location: [facility] ENDOSCOPY;  Service: Cardiopulmonary;;   INNER EAR SURGERY     IR ANGIOGRAM SELECTIVE EACH ADDITIONAL VESSEL  09/09/2023   IR ANGIOGRAM VISCERAL SELECTIVE  09/09/2023   IR ANGIOGRAM VISCERAL SELECTIVE  09/09/2023   IR EMBO ART  VEN HEMORR LYMPH EXTRAV  INC GUIDE ROADMAPPING  09/09/2023   IR THORACENTESIS ASP PLEURAL SPACE W/IMG GUIDE  09/26/2023   IR US  GUIDE VASC ACCESS RIGHT  09/09/2023   KNEE SURGERY Left    THORACENTESIS Left 06/06/2023   Procedure: THORACENTESIS;  Surgeon: Arlinda Ranks, MD;  Location: [facility] ENDOSCOPY;  Service: Pulmonary;  Laterality: Left;   TONSILLECTOMY     VIDEO BRONCHOSCOPY WITH ENDOBRONCHIAL NAVIGATION Left 08/28/2023   Procedure: VIDEO BRONCHOSCOPY WITH ENDOBRONCHIAL NAVIGATION;  Surgeon: Shelah Lamar RAMAN, MD;  Location: [facility] ENDOSCOPY;  Service: Pulmonary;  Laterality: Left;   VIDEO BRONCHOSCOPY WITH ENDOBRONCHIAL ULTRASOUND Left 06/12/2023   Procedure: BRONCHOSCOPY, WITH EBUS;  Surgeon: Shelah Lamar RAMAN, MD;  Location: [facility] ENDOSCOPY;  Service: Cardiopulmonary;  Laterality: Left;   VIDEO BRONCHOSCOPY WITH ENDOBRONCHIAL ULTRASOUND  08/28/2023   Procedure: BRONCHOSCOPY, WITH EBUS;  Surgeon: Shelah Lamar RAMAN, MD;  Location: [facility] ENDOSCOPY;  Service: Pulmonary;;   Family History:  Family History  Problem Relation Age of Onset   Heart failure Mother    COPD Mother    Atrial fibrillation Mother    Idiopathic pulmonary fibrosis Father    Social History:  Social History   Tobacco Use   Smoking status: Former    Current packs/day: 0.00    Average packs/day: 2.0 packs/day for 20.0 years (40.0 ttl pk-yrs)    Types: Cigarettes    Start date: 01/31/1970    Quit date: 01/31/1990    Years since quitting: 33.9   Smokeless tobacco: Never  Vaping Use   Vaping status: Never Used  Substance Use Topics   Alcohol use: Not Currently    Comment: 4-5 beer daily   Drug use: No     Home Medications:  Prior to Admission medications   Medication Sig Start Date End Date Taking? Authorizing Provider  acetaminophen  (TYLENOL ) 500 MG tablet Take 500 mg by mouth every 6 (six) hours as needed for mild pain.    [provider]  allopurinol  (ZYLOPRIM ) 100 MG tablet Take 100 mg by mouth daily. 05/25/22   [provider]  apixaban  (ELIQUIS ) 5 MG TABS tablet Take 1 tablet (5 mg total) by mouth 2 (two) times daily. Okay to restart this medication on 08/29/2023 08/28/23   Byrum, Robert S, MD  budesonide -glycopyrrolate -formoterol  (BREZTRI  AEROSPHERE) 160-9-4.8 MCG/ACT AERO inhaler Inhale 2 puffs into the lungs in the morning and at bedtime. 10/11/23   Hattar, Zola SAILOR, MD  cholecalciferol  (VITAMIN D3) 25 MCG (1000 UNIT) tablet Take 1,000 Units by mouth daily.    [provider]  cyanocobalamin  (VITAMIN  B12) 100 MCG tablet Take 100 mcg by mouth daily.    [provider]  folic acid  (FOLVITE ) 1 MG tablet TAKE 1 TABLET BY MOUTH EVERY DAY 12/07/23   Rourk, Lamar HERO, MD  furosemide  (LASIX ) 40 MG tablet Take 20 mg by mouth daily.    [provider]  HYDROcodone  bit-homatropine (HYCODAN) 5-1.5 MG/5ML syrup Take 5 mLs by mouth every 6 (six) hours as needed for cough. 07/03/23   Meade Verdon RAMAN, MD  ipratropium-albuterol  (DUONEB) 0.5-2.5 (3) MG/3ML SOLN Take 3 mLs by nebulization every 6 (six) hours as needed. 10/31/23   Hattar, Zola SAILOR, MD  levalbuterol  (XOPENEX  HFA) 45 MCG/ACT inhaler Inhale 1 puff into the lungs every 4 (four) hours as needed for wheezing. 07/26/23 07/25/24  Desai, Nikita S, MD  loperamide (IMODIUM A-D) 2 MG tablet Take 2-4 mg by mouth 4 (four) times daily as needed for diarrhea or loose stools. Patient not taking: Reported on 11/30/2023    [provider]  metoprolol  tartrate (LOPRESSOR ) 25 MG tablet Take 1 tablet (25 mg total) by mouth 2 (two) times daily. 11/17/21   Strader, Laymon HERO, PA-C  pantoprazole  (PROTONIX ) 40 MG tablet  Take 1 tablet (40 mg total) by mouth 2 (two) times daily before a meal. 10/17/23   Rourk, Lamar HERO, MD  thiamine  (VITAMIN B-1) 100 MG tablet Take 100 mg by mouth daily.    [provider]    Inpatient Medications:  Current Facility-Administered Medications:    0.9 %  sodium chloride  infusion, , Intravenous, Continuous, Mansy, Jan A, MD, Last Rate: 100 mL/hr at 01/06/24 2353, New Bag at 01/06/24 2353   acetaminophen  (TYLENOL ) tablet 650 mg, 650 mg, Oral, Q6H PRN **OR** acetaminophen  (TYLENOL ) suppository 650 mg, 650 mg, Rectal, Q6H PRN, Mansy, Jan A, MD   allopurinol  (ZYLOPRIM ) tablet 100 mg, 100 mg, Oral, Daily, Mansy, Jan A, MD, 100 mg at 01/07/24 9048   budesonide -glycopyrrolate -formoterol  (BREZTRI ) 160-9-4.8 MCG/ACT inhaler 2 puff, 2 puff, Inhalation, BID, Mansy, Jan A, MD, 2 puff at 01/07/24 9147   cholecalciferol  (VITAMIN D3) 25 MCG (1000 UNIT) tablet 1,000 Units, 1,000 Units, Oral, Daily, Mansy, Jan A, MD, 1,000 Units at 01/07/24 9048   folic acid  (FOLVITE ) tablet 1 mg, 1 mg, Oral, Daily, Mansy, Jan A, MD, 1 mg at 01/07/24 9048   guaiFENesin  (MUCINEX ) 12 hr tablet 600 mg, 600 mg, Oral, BID, Mansy, Jan A, MD   HYDROcodone  bit-homatropine (HYCODAN) 5-1.5 MG/5ML syrup 5 mL, 5 mL, Oral, Q6H PRN, Mansy, Jan A, MD   ipratropium-albuterol  (DUONEB) 0.5-2.5 (3) MG/3ML nebulizer solution 3 mL, 3 mL, Nebulization, Q6H PRN, Mansy, Jan A, MD   ipratropium-albuterol  (DUONEB) 0.5-2.5 (3) MG/3ML nebulizer solution 3 mL, 3 mL, Nebulization, BID, Ricky Fines, MD   magnesium  hydroxide (MILK OF MAGNESIA) suspension 30 mL, 30 mL, Oral, Daily PRN, Mansy, Jan A, MD   metoprolol  tartrate (LOPRESSOR ) tablet 25 mg, 25 mg, Oral, BID, Mansy, Jan A, MD, 25 mg at 01/07/24 9048   ondansetron  (ZOFRAN ) tablet 4 mg, 4 mg, Oral, Q6H PRN **OR** ondansetron  (ZOFRAN ) injection 4 mg, 4 mg, Intravenous, Q6H PRN, Mansy, Jan A, MD   pantoprazole  (PROTONIX ) injection 40 mg, 40 mg, Intravenous, Q12H, Mansy, Jan A, MD, 40 mg  at 01/07/24 9048   thiamine  (VITAMIN B1) tablet 100 mg, 100 mg, Oral, Daily, Mansy, Jan A, MD, 100 mg at 01/07/24 9048   traZODone  (DESYREL ) tablet 25 mg, 25 mg, Oral, QHS PRN, Mansy, Jan A, MD, 25 mg at 01/06/24 2357  vitamin B-12 (CYANOCOBALAMIN ) tablet 100 mcg, 100 mcg, Oral, Daily, Mansy, Jan A, MD, 100 mcg at 01/07/24 9048 Allergies: Patient has no known allergies.  Complete Review of Systems: GENERAL: negative for malaise, night sweats HEENT: No changes in hearing or vision, no nose bleeds or other nasal problems. NECK: Negative for lumps, goiter, pain and significant neck swelling RESPIRATORY: Negative for cough, wheezing CARDIOVASCULAR: Negative for chest pain, leg swelling, palpitations, orthopnea GI: SEE HPI MUSCULOSKELETAL: Negative for joint pain or swelling, back pain, and muscle pain. SKIN: Negative for lesions, rash PSYCH: Negative for sleep disturbance, mood disorder and recent psychosocial stressors. HEMATOLOGY Negative for prolonged bleeding, bruising easily, and swollen nodes. ENDOCRINE: Negative for cold or heat intolerance, polyuria, polydipsia and goiter. NEURO: negative for tremor, gait imbalance, syncope and seizures. The remainder of the review of systems is noncontributory.  Physical Exam: BP 115/69 (BP Location: Right Arm)   Pulse 76   Temp 97.6 F (36.4 C) (Oral)   Resp 18   Ht 6' (1.829 m)   Wt 118.2 kg   SpO2 99%   BMI 35.34 kg/m  GENERAL: The patient is AO x3, in no acute distress. HEENT: Head is normocephalic and atraumatic. EOMI are intact. Mouth is well hydrated and without lesions. NECK: Supple. No masses LUNGS: Clear to auscultation. No presence of rhonchi/wheezing/rales. Adequate chest expansion HEART: RRR, normal s1 and s2. ABDOMEN: Soft, nontender, no guarding, no peritoneal signs, and nondistended. BS +. No masses. EXTREMITIES: Without any cyanosis, clubbing, rash, lesions or edema. NEUROLOGIC: AOx3, no focal motor deficit. SKIN: no  jaundice, no rashes  Laboratory Data CBC:     Component Value Date/Time   WBC 5.0 01/07/2024 0434   RBC 3.52 (L) 01/07/2024 0434   HGB 9.9 (L) 01/07/2024 1100   HGB 11.3 (L) 11/30/2023 1032   HGB 11.4 (L) 06/01/2023 1439   HCT 32.0 (L) 01/07/2024 1100   HCT 36.9 (L) 06/01/2023 1439   PLT 195 01/07/2024 0434   PLT 182 11/30/2023 1032   PLT 231 06/01/2023 1439   MCV 85.2 01/07/2024 0434   MCV 84 06/01/2023 1439   MCH 26.4 01/07/2024 0434   MCHC 31.0 01/07/2024 0434   RDW 15.5 01/07/2024 0434   RDW 16.8 (H) 06/01/2023 1439   LYMPHSABS 0.4 (L) 11/30/2023 1032   LYMPHSABS 1.0 06/01/2023 1439   MONOABS 0.5 11/30/2023 1032   EOSABS 0.2 11/30/2023 1032   EOSABS 0.2 06/01/2023 1439   BASOSABS 0.0 11/30/2023 1032   BASOSABS 0.0 06/01/2023 1439   COAG:  Lab Results  Component Value Date   INR 1.2 01/06/2024   INR 1.0 09/13/2023   INR 1.3 (H) 09/07/2023    BMP:     Latest Ref Rng & Units 01/07/2024    4:34 AM 01/06/2024    8:34 PM 11/30/2023   10:32 AM  BMP  Glucose 70 - 99 mg/dL 96  91  96   BUN 8 - 23 mg/dL 16  18  14    Creatinine 0.61 - 1.24 mg/dL 9.28  9.31  9.13   Sodium 135 - 145 mmol/L 138  137  139   Potassium 3.5 - 5.1 mmol/L 3.9  4.1  3.9   Chloride 98 - 111 mmol/L 105  102  106   CO2 22 - 32 mmol/L 29  28  25    Calcium  8.9 - 10.3 mg/dL 8.4  9.0  9.4     HEPATIC:     Latest Ref Rng & Units 01/06/2024  8:34 PM 11/30/2023   10:32 AM 11/01/2023   10:28 AM  Hepatic Function  Total Protein 6.5 - 8.1 g/dL 6.8  6.6  6.7   Albumin 3.5 - 5.0 g/dL 3.7  3.7  3.7   AST 15 - 41 U/L 15  13  13    ALT 0 - 44 U/L 9  5  8    Alk Phosphatase 38 - 126 U/L 65  69  67   Total Bilirubin 0.0 - 1.2 mg/dL 0.5  0.5  0.4     CARDIAC:  Lab Results  Component Value Date   TROPONINI <0.03 06/08/2018     Imaging: I personally reviewed and interpreted the available imaging.  Assessment & Plan: LARICO DIMOCK is a 73 y.o. 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  Patient presented 1 isolated episode of melena and has been hemodynamically stable.  He had some drop in his hemoglobin today, although it could be likely related to hemodilution as his BUN has remained low and he has not presented any more episodes of melena.  Given his history of peptic ulcer disease required embolization in the past, he should undergo endoscopic evaluation to evaluate his symptoms further.  This will be performed tomorrow to allow adequate washout of his Eliquis .  For now, we will sinew pantoprazole  40 mg twice daily IV.  Patient should not agree.  - Repeat CBC qday, transfuse if Hb <7 - Pantoprazole   40 mg q12h IVP - 2 large bore IV lines - Active T/S - Clear liquid diet for now, n.p.o. after midnight -Hold Eliquis  - Avoid NSAIDs - Will proceed with EGD tomorrow  Toribio Fortune, MD Gastroenterology and Hepatology [facility] Gastroenterology

## 2024-01-07 NOTE — TOC Initial Note (Signed)
 Transition of Care [facility]) - Initial/Assessment Note    Patient Details  Name: Raymond Hurst MRN: [id number] Date of Birth: 1950-07-11  Transition [facility]) CM/SW Contact:    Nancee Powell BIRCH, LCSW Phone Number: 01/07/2024, 3:02 PM  Clinical Narrative:                 Patient is moderate risk for readmission. Patient from home with spouse. Is independent at baseline, drives. No current in home services, no verbalized needs. TOC will continue to follow.     Barriers to Discharge: Continued Medical Work up   Patient Goals and CMS Choice Patient states their goals for this hospitalization and ongoing recovery are:: return home          Expected Discharge Plan and Services       Living arrangements for the past 2 months: Single Family Home                                      Prior Living Arrangements/Services Living arrangements for the past 2 months: Single Family Home Lives with:: Spouse Patient language and need for interpreter reviewed:: Yes Do you feel safe going back to the place where you live?: Yes      Need for Family Participation in Patient Care: No (Comment) Care giver support system in place?: No (comment)      Activities of Daily Living   ADL Screening (condition at time of admission) Independently performs ADLs?: Yes (appropriate for developmental age) Is the patient deaf or have difficulty hearing?: No Does the patient have difficulty seeing, even when wearing glasses/contacts?: No Does the patient have difficulty concentrating, remembering, or making decisions?: No  Permission Sought/Granted                  Emotional Assessment Appearance:: Appears stated age   Affect (typically observed): Appropriate Orientation: : Oriented to Self, Oriented to Place, Oriented to  Time, Oriented to Situation Alcohol / Substance Use: Not Applicable Psych Involvement: No (comment)  Admission diagnosis:  Melena [K92.1] GI bleeding  [K92.2] Gastrointestinal hemorrhage, unspecified gastrointestinal hemorrhage type [K92.2] Patient Active Problem List   Diagnosis Date Noted   Atrial fibrillation, chronic (HCC) 01/07/2024   OSA (obstructive sleep apnea) 01/07/2024   Chronic obstructive pulmonary disease (COPD) (HCC) 01/07/2024   Acute bronchitis 01/07/2024   GI bleeding 01/06/2024   GI bleed 09/07/2023   Acute on chronic anemia 09/07/2023   Pneumothorax after biopsy 08/28/2023   Metastasis to left adrenal gland of unknown origin (HCC) 07/05/2023   Lung cancer, upper lobe (HCC) 06/27/2023   Acute hypoxic respiratory failure (HCC) 06/13/2023   Atrial fibrillation with rapid ventricular response (HCC) 06/13/2023   Lung mass 06/05/2023   Dysphonia 06/05/2023   Gout 07/22/2022   Right adrenal mass 07/22/2022   Diverticulosis of intestine with bleeding 07/22/2022   Acute blood loss anemia 07/22/2022   Lower GI bleeding 07/21/2022   OSA on CPAP 05/10/2022   Alcoholism (HCC) 01/14/2021   Cardiac arrhythmia 01/14/2021   Hearing loss of left ear 01/14/2021   Long term (current) use of anticoagulants 01/14/2021   Lymphedema 01/14/2021   Neurocognitive disorder 01/14/2021   Tinnitus of right ear 01/14/2021   Vitamin B12 deficiency (non anemic) 01/14/2021   Vitamin D  deficiency 01/14/2021   DOE (dyspnea on exertion) 03/25/2020   Encounter for removal of sutures 12/24/2019   Dizziness 06/08/2018  TIA (transient ischemic attack) 06/08/2018   Paroxysmal atrial fibrillation (HCC) 06/08/2018   Near syncope 06/07/2018   Hypokalemia 06/07/2018   Chronic suppurative otitis media of left ear 04/04/2018   Presbycusis of both ears 04/04/2018   Unilateral deafness, left 04/04/2018   Subjective tinnitus of both ears 06/02/2015   Lymphedema of leg 04/08/2013   Cellulitis of right lower leg 04/04/2013   Morbid obesity due to excess calories (HCC) 04/04/2013   Alcohol abuse 04/04/2013   Cellulitis and abscess of leg 04/04/2013    PCP:  [name], MD Pharmacy:   [facility] [phone] - [place], [place] - [address] [facility] [address] [place] [address] Phone: [phone] Fax: [phone]     Social Drivers of Health (SDOH) Social History: SDOH Screenings   Food Insecurity: No Food Insecurity (01/06/2024)  Housing: Unknown (01/06/2024)  Transportation Needs: No Transportation Needs (01/06/2024)  Utilities: Not At Risk (01/06/2024)  Alcohol Screen: Low Risk  (06/15/2023)  Depression (PHQ2-9): Low Risk  (11/30/2023)  Financial Resource Strain: High Risk (06/15/2023)  Physical Activity: Inactive (06/15/2023)  Social Connections: Unknown (01/06/2024)  Stress: No Stress Concern Present (06/15/2023)  Tobacco Use: Medium Risk (01/06/2024)   SDOH Interventions:     Readmission Risk Interventions    09/08/2023   12:09 PM  Readmission Risk Prevention Plan  Transportation Screening Complete  HRI or Home Care Consult Complete  Social Work Consult for Recovery Care Planning/Counseling Complete  Palliative Care Screening Not Applicable  Medication Review Oceanographer) Complete

## 2024-01-07 NOTE — Plan of Care (Signed)

## 2024-01-07 NOTE — Progress Notes (Signed)
 PROGRESS NOTE    Raymond Hurst  FMW:[id number] DOB: 08-10-1950 DOA: 01/06/2024 PCP: [name], MD    Chief Complaint  Patient presents with   Blood In Stools    Brief Narrative:  As per H&P written by Dr. Lawence on 01/06/24 Raymond Hurst is a 73 y.o. Caucasian male with medical history significant for COPD, atrial fibrillation on Eliquis , OSA on CPAP and CVA, who presented to the ER with acute onset of melena as well as blood clots with a bowel movement this evening.  No nausea or vomiting or heartburn.  No fever or chills.  No chest pain or palpitations.  He has been having cough productive of clear sputum for about a month with no wheezing or dyspnea.  No dysuria, oliguria or hematuria or flank pain.  No other bleeding diathesis.   ED Course: When the patient came to the ER, vital signs were within normal.  Labs were  within normal.  CBC showed hemoglobin 11.2 and hematocrit 3 35.3.  INR was 1.2 with PT 16.3.  Blood group was all negative with negative antibody screen.  Occult blood came back positive. EKG as reviewed by me : EKG showed atrial fibrillation with controlled ventricular sponsor of 95 with left axis deviation with poor R wave progression and borderline prolonged QT interval with QTc of 479 MS.. Imaging: None.  Assessment & Plan: 1-GI bleed - With concern for upper GI source of bleeding - Continue to hold Eliquis  - Continue PPI - Case discussed with gastroenterology service with plan for endoscopy evaluation on 01/08/2024 - Clear liquid diet will be allow with n.p.o. status after midnight. - Follow hemoglobin trend and transfuse for hemoglobin less than 7.  2-concern for acute bronchitis - Continue as needed bronchodilator management and mucolytic's - Patient's WBC is within normal limits, no fever, no shortness of breath or hypoxemic events - Will hold on antibiotics for now.  3-chronic atrial fibrillation - Continue Lopressor  for rate control - Holding Eliquis   in the setting of GI bleed. - Continue telemetry monitoring.  4-history of gout - No acute flare - Continue allopurinol .  5-history of obstructive sleep apnea - Continue CPAP nightly.  6-class II obesity -Body mass index is 35.34 kg/m.  - Low-calorie diet, portion control and increased activity discussed with patient.SABRA  7-history of COPD/asthma - Continue bronchodilator management - No wheezing currently appreciated. - Patient did not demonstrate the need of using accessory muscles and have good oxygen saturation on room air.   DVT prophylaxis: SCDs. Code Status: Full code. Family Communication: No family at bedside. Disposition:   Status is: Inpatient Remains inpatient appropriate because: Continue IV therapy and follow recommendation by GI for further management after endoscopic evaluation.   Consultants:  Gastroenterology service.  Procedures:  See below for x-ray reports Anticipated endoscopic evaluation on 01/08/2024.  Antimicrobials:  Patient received 1 dose of ceftriaxone at time of admission with concern for bronchitis.   Subjective: Afebrile, no chest pain, no nausea, no vomiting.  Patient reports no further episodes of overt bleeding and expresses no acute complaints.   Objective: Vitals:   01/07/24 0350 01/07/24 0730 01/07/24 0820 01/07/24 1213  BP: (!) 107/59 115/63  115/69  Pulse: 66 77  76  Resp:      Temp: (!) 97.5 F (36.4 C) 97.7 F (36.5 C)  97.6 F (36.4 C)  TempSrc: Oral Oral  Oral  SpO2: 95% 98% 97% 99%  Weight:      Height:  Intake/Output Summary (Last 24 hours) at 01/07/2024 1539 Last data filed at 01/07/2024 1500 Gross per 24 hour  Intake 1000.05 ml  Output 350 ml  Net 650.05 ml   Filed Weights   01/06/24 2028 01/06/24 2345  Weight: 117 kg 118.2 kg    Examination:  General exam: Appears calm and comfortable.  Reported noticing some bloody stools overnight.  No nausea, no vomiting, no abdominal pain. Respiratory  system: Good saturation on room air. Cardiovascular system: Rate controlled, no rubs, no gallops, no JVD. Gastrointestinal system: Abdomen is obese, nondistended, soft and nontender.  Positive bowel sounds appreciated on exam. Central nervous system: No focal neurological deficits. Extremities: Cyanosis or clubbing; trace to 1+ edema appreciated bilaterally (unchanged from baseline). Skin: No petechiae. Psychiatry: Judgement and insight appear normal. Mood & affect appropriate.     Data Reviewed: I have personally reviewed following labs and imaging studies  CBC: Recent Labs  Lab 01/06/24 2034 01/07/24 0434 01/07/24 1100  WBC 7.2 5.0  --   HGB 11.2* 9.3* 9.9*  HCT 35.3* 30.0* 32.0*  MCV 84.0 85.2  --   PLT 246 195  --     Basic Metabolic Panel: Recent Labs  Lab 01/06/24 2034 01/07/24 0434  NA 137 138  K 4.1 3.9  CL 102 105  CO2 28 29  GLUCOSE 91 96  BUN 18 16  CREATININE 0.68 0.71  CALCIUM  9.0 8.4*    GFR: Estimated Creatinine Clearance: 112.4 mL/min (by C-G formula based on SCr of 0.71 mg/dL).  Liver Function Tests: Recent Labs  Lab 01/06/24 2034  AST 15  ALT 9  ALKPHOS 65  BILITOT 0.5  PROT 6.8  ALBUMIN 3.7    CBG: No results for input(s): GLUCAP in the last 168 hours.   No results found for this or any previous visit (from the past 240 hours).   Radiology Studies: CT ANGIO GI BLEED Result Date: 01/06/2024 EXAM: CTA ABDOMEN AND PELVIS WITH CONTRAST 01/06/2024 09:58:06 PM TECHNIQUE: CTA images of the abdomen and pelvis with intravenous contrast. 100 mL iohexol  (OMNIPAQUE ) 350 MG/ML injection. Three-dimensional MIP/volume rendered formations were performed. Automated exposure control, iterative reconstruction, and/or weight based adjustment of the mA/kV was utilized to reduce the radiation dose to as low as reasonably achievable. COMPARISON: Chest CT 08/26/2023. CLINICAL HISTORY: Melena. FINDINGS: VASCULATURE: GI BLEED: No active extravasation of  contrast within the GI tract. AORTA: Coronary artery and aortic atherosclerosis. No acute finding. No abdominal aortic aneurysm. No dissection. CELIAC TRUNK: No acute finding. No occlusion or significant stenosis. SUPERIOR MESENTRIC ARTERY: No acute finding. No occlusion or significant stenosis. INFERIOR MESENTERIC ARTERY: No acute finding. No occlusion or significant stenosis. RENAL ARTERIES: No acute finding. No occlusion or significant stenosis. ILIAC ARTERIES: No acute finding. No occlusion or significant stenosis. ABDOMEN/PELVIS: LOWER CHEST: Moderate left pleural effusion, similar to prior chest CT. LIVER: The liver is unremarkable. GALLBLADDER AND BILE DUCTS: Gallbladder is unremarkable. No biliary ductal dilatation. SPLEEN: The spleen is unremarkable. PANCREAS: The pancreas is unremarkable. ADRENAL GLANDS: A fat-containing mass in the right adrenal gland measures 6.5 cm, stable since the prior study, compatible with myelolipoma. KIDNEYS, URETERS AND BLADDER: 5.4 cm mid pole right renal cyst. 6.6 cm left lower pole cyst. No stones in the kidneys or ureters. No hydronephrosis. No perinephric or periureteral stranding. Large diverticulum off the left side of the bladder. GI AND BOWEL: Stomach and duodenal sweep demonstrate no acute abnormality. Extensive colonic diverticulosis. No active diverticulitis. Normal appendix. There is no bowel obstruction.  No abnormal bowel wall thickening or distension. REPRODUCTIVE: Extensive prostate calcifications. Prostatomegaly. PERITONEUM AND RETROPERITONEUM: No ascites or free air. LYMPH NODES: No lymphadenopathy. BONES AND SOFT TISSUES: No acute abnormality of the bones. No acute soft tissue abnormality. IMPRESSION: 1. No active GI bleeding. 2. Aortic atherosclerosis. 3. Extensive colonic diverticulosis without evidence of diverticulitis. 4. Moderate left pleural effusion, similar to prior chest CT. Electronically signed by: Franky Crease MD 01/06/2024 10:16 PM EST RP  Workstation: [id number]   Scheduled Meds:  allopurinol   100 mg Oral Daily   budesonide -glycopyrrolate -formoterol   2 puff Inhalation BID   cholecalciferol   1,000 Units Oral Daily   folic acid   1 mg Oral Daily   guaiFENesin   600 mg Oral BID   ipratropium-albuterol   3 mL Nebulization BID   metoprolol  tartrate  25 mg Oral BID   pantoprazole  (PROTONIX ) IV  40 mg Intravenous Q12H   thiamine   100 mg Oral Daily   cyanocobalamin   100 mcg Oral Daily   Continuous Infusions:  sodium chloride  100 mL/hr at 01/06/24 2353     LOS: 1 day    Time spent: 35 minutes   Eric Nunnery, MD [facility]   To contact the attending provider between 7A-7P or the covering provider during after hours 7P-7A, please log into the web site [email or web address] and access using universal [place] password for that web site. If you do not have the password, please call the hospital operator.  01/07/2024, 3:39 PM

## 2024-01-07 NOTE — Assessment & Plan Note (Signed)
-   Will continue bronchodilator therapy.

## 2024-01-07 NOTE — Assessment & Plan Note (Signed)
-   Will resume CPAP nightly.

## 2024-01-08 ENCOUNTER — Telehealth: Payer: Self-pay | Admitting: Gastroenterology

## 2024-01-08 ENCOUNTER — Inpatient Hospital Stay (HOSPITAL_COMMUNITY): Admitting: Anesthesiology

## 2024-01-08 ENCOUNTER — Encounter: Payer: Self-pay | Attending: Internal Medicine

## 2024-01-08 DIAGNOSIS — E66812 Obesity, class 2: Secondary | ICD-10-CM

## 2024-01-08 DIAGNOSIS — J42 Unspecified chronic bronchitis: Secondary | ICD-10-CM

## 2024-01-08 DIAGNOSIS — M1A9XX0 Chronic gout, unspecified, without tophus (tophi): Secondary | ICD-10-CM | POA: Diagnosis not present

## 2024-01-08 DIAGNOSIS — K921 Melena: Secondary | ICD-10-CM

## 2024-01-08 DIAGNOSIS — K254 Chronic or unspecified gastric ulcer with hemorrhage: Secondary | ICD-10-CM

## 2024-01-08 DIAGNOSIS — G4733 Obstructive sleep apnea (adult) (pediatric): Secondary | ICD-10-CM | POA: Diagnosis not present

## 2024-01-08 DIAGNOSIS — I482 Chronic atrial fibrillation, unspecified: Secondary | ICD-10-CM | POA: Diagnosis not present

## 2024-01-08 DIAGNOSIS — K922 Gastrointestinal hemorrhage, unspecified: Secondary | ICD-10-CM | POA: Diagnosis not present

## 2024-01-08 HISTORY — PX: ESOPHAGOGASTRODUODENOSCOPY: SHX5428

## 2024-01-08 LAB — CBC
HCT: 31 % — ABNORMAL LOW (ref 39.0–52.0)
Hemoglobin: 9.6 g/dL — ABNORMAL LOW (ref 13.0–17.0)
MCH: 26.4 pg (ref 26.0–34.0)
MCHC: 31 g/dL (ref 30.0–36.0)
MCV: 85.4 fL (ref 80.0–100.0)
Platelets: 202 K/uL (ref 150–400)
RBC: 3.63 MIL/uL — ABNORMAL LOW (ref 4.22–5.81)
RDW: 15.8 % — ABNORMAL HIGH (ref 11.5–15.5)
WBC: 4.9 K/uL (ref 4.0–10.5)
nRBC: 0 % (ref 0.0–0.2)

## 2024-01-08 LAB — BASIC METABOLIC PANEL WITH GFR
Anion gap: 3 — ABNORMAL LOW (ref 5–15)
BUN: 10 mg/dL (ref 8–23)
CO2: 30 mmol/L (ref 22–32)
Calcium: 8.7 mg/dL — ABNORMAL LOW (ref 8.9–10.3)
Chloride: 105 mmol/L (ref 98–111)
Creatinine, Ser: 0.73 mg/dL (ref 0.61–1.24)
GFR, Estimated: >60 mL/min (ref >60)
Glucose, Bld: 90 mg/dL (ref 70–99)
Potassium: 4.2 mmol/L (ref 3.5–5.1)
Sodium: 139 mmol/L (ref 135–145)

## 2024-01-08 SURGERY — EGD (ESOPHAGOGASTRODUODENOSCOPY)
Anesthesia: Monitor Anesthesia Care

## 2024-01-08 MED ORDER — LIDOCAINE HCL (CARDIAC) PF 100 MG/5ML IV SOSY
PREFILLED_SYRINGE | INTRAVENOUS | Status: DC | PRN
  Administered 2024-01-08: 50 mg via INTRAVENOUS

## 2024-01-08 MED ORDER — SODIUM CHLORIDE 0.9 % IV SOLN: INTRAVENOUS | Status: DC

## 2024-01-08 MED ORDER — APIXABAN 5 MG PO TABS: 5.0000 mg | ORAL_TABLET | Freq: Two times a day (BID) | ORAL | Status: AC

## 2024-01-08 MED ORDER — LACTATED RINGERS IV SOLN: INTRAVENOUS | Status: DC

## 2024-01-08 MED ORDER — PROPOFOL 10 MG/ML IV BOLUS
INTRAVENOUS | Status: DC | PRN
  Administered 2024-01-08: 20 mg via INTRAVENOUS
  Administered 2024-01-08: 50 mg via INTRAVENOUS

## 2024-01-08 NOTE — Telephone Encounter (Signed)
 Please arrange hospital follow-up in 2-3 weeks, any AP. Has previously seen Charmaine and Dr Shaaron as outpatient.

## 2024-01-08 NOTE — Progress Notes (Signed)
 Mobility Specialist Progress Note:    01/08/24 0900  Mobility  Activity Ambulated with assistance  Level of Assistance Independent  Assistive Device None  Distance Ambulated (ft) 240 ft  Range of Motion/Exercises Active;All extremities  Activity Response Tolerated well  Mobility Referral Yes  Mobility visit 1 Mobility  Mobility Specialist Start Time (ACUTE ONLY) 0900  Mobility Specialist Stop Time (ACUTE ONLY) 0920  Mobility Specialist Time Calculation (min) (ACUTE ONLY) 20 min   Pt received in bed, agreeable to mobility. Independently able to stand and ambulate with no AD. Tolerated well, c/o being a little dizzy d/t sitting in bed for so long. Returned supine, all needs met.  Elliett Guarisco Mobility Specialist Please contact via Special Educational Needs Teacher or  Rehab office at [phone]

## 2024-01-08 NOTE — Telephone Encounter (Signed)
 Message sent to endo to cancel

## 2024-01-08 NOTE — Progress Notes (Signed)
 Nurse at bedside Patient has no complaints or questions at this time for myself. He agreed to mobility with Darren and walked a lap around the unit with a small complaint of dizziness states, I think it is from laying in bed for so long. There has not been any more complaints of dizziness since returning to his room from walking.

## 2024-01-08 NOTE — Discharge Summary (Signed)
 Physician Discharge Summary   Patient: Raymond Hurst MRN: [id number] DOB: 1951/01/30  Admit date:     01/06/2024  Discharge date: 01/08/24  Discharge Physician: Eric Nunnery   PCP: [name], MD   Recommendations at discharge:  Repeat CBC to follow hemoglobin trend/stability Make sure patient follow-up with gastroenterology service as instructed Continue assisting patient with weight loss management; if needed arrange outpatient follow-up with obesity management/bariatric clinic. Reassess blood pressure and adjust antihypertensive regimen as needed Repeat basic metabolic panel to follow electrolytes and renal function.  Discharge Diagnoses: Principal Problem:   GI bleeding Active Problems:   Acute bronchitis   Atrial fibrillation, chronic (HCC)   Gout   Chronic obstructive pulmonary disease (COPD) (HCC)   OSA (obstructive sleep apnea)   Melena   Class II obesity  Brief Narrative:  As per H&P written by Dr. Lawence on 01/06/24 Raymond Hurst is a 73 y.o. Caucasian male with medical history significant for COPD, atrial fibrillation on Eliquis , OSA on CPAP and CVA, who presented to the ER with acute onset of melena as well as blood clots with a bowel movement this evening.  No nausea or vomiting or heartburn.  No fever or chills.  No chest pain or palpitations.  He has been having cough productive of clear sputum for about a month with no wheezing or dyspnea.  No dysuria, oliguria or hematuria or flank pain.  No other bleeding diathesis.   ED Course: When the patient came to the ER, vital signs were within normal.  Labs were  within normal.  CBC showed hemoglobin 11.2 and hematocrit 3 35.3.  INR was 1.2 with PT 16.3.  Blood group was all negative with negative antibody screen.  Occult blood came back positive. EKG as reviewed by me : EKG showed atrial fibrillation with controlled ventricular sponsor of 95 with left axis deviation with poor R wave progression and borderline prolonged  QT interval with QTc of 479 MS.. Imaging: None.  Assessment and Plan: 1-upper GI bleed - With concern for upper GI source of bleeding -Appreciate assistance and recommendation by gastroenterology service; status post endoscopy evaluation demonstrating clean base gastric ulcer without signs of active bleeding.  Safe to resume Eliquis  on 01/09/2024. -Continue the use of PPI twice a day and follow-up with gastroenterology as an outpatient. - Avoid the use of NSAIDs. -Patient's hemoglobin remained stable and no transfusion required - At discharge hemoglobin 9.6. -Repeat CBC-obesity to assess stability.  2-concern for acute on chronic bronchitis - Continue as needed bronchodilator management and mucolytic's - Patient's WBC is within normal limits, no fever, no shortness of breath or hypoxemic events -No further antibiotics passed one-time dose at time of presentation to the ED when required - Patient remain stable and in no acute distress.   3-chronic atrial fibrillation - Continue Lopressor  for rate control -Condition remains stable and well-controlled - Safe to resume Eliquis  for secondary prevention on 01/09/2024 as per GI recommendations.   4-history of gout - No acute flare - Continue allopurinol .   5-history of obstructive sleep apnea - Continue CPAP nightly.   6-class II obesity -Body mass index is 35.34 kg/m.  - Low-calorie diet, portion control and increased activity discussed with patient.SABRA   7-history of COPD/asthma - Continue bronchodilator management - No wheezing currently appreciated. - Patient did not demonstrate the need of oxygen supplementation and has not used accessory muscles during exam.  Consultants: Gastroenterology service. Procedures performed: See below for x-ray report; EGD (positive gastric ulcer  without active bleeding and clean base appreciated during evaluation). Disposition: Home Diet recommendation: Heart healthy/low calorie diet.  DISCHARGE  MEDICATION: Allergies as of 01/08/2024   No Known Allergies      Medication List     STOP taking these medications    HYDROcodone  bit-homatropine 5-1.5 MG/5ML syrup Commonly known as: HYCODAN       TAKE these medications    acetaminophen  500 MG tablet Commonly known as: TYLENOL  Take 500 mg by mouth every 6 (six) hours as needed for mild pain.   allopurinol  100 MG tablet Commonly known as: ZYLOPRIM  Take 100 mg by mouth daily.   apixaban  5 MG Tabs tablet Commonly known as: ELIQUIS  Take 1 tablet (5 mg total) by mouth 2 (two) times daily. Okay to restart this medication on 08/29/2023 Start taking on: January 09, 2024   Breztri  Aerosphere 160-9-4.8 MCG/ACT Aero inhaler Generic drug: budesonide -glycopyrrolate -formoterol  Inhale 2 puffs into the lungs in the morning and at bedtime.   cholecalciferol  25 MCG (1000 UNIT) tablet Commonly known as: VITAMIN D3 Take 1,000 Units by mouth daily.   cyanocobalamin  100 MCG tablet Commonly known as: VITAMIN B12 Take 100 mcg by mouth daily.   folic acid  1 MG tablet Commonly known as: FOLVITE  TAKE 1 TABLET BY MOUTH EVERY DAY   furosemide  40 MG tablet Commonly known as: LASIX  Take 20 mg by mouth daily.   ipratropium-albuterol  0.5-2.5 (3) MG/3ML Soln Commonly known as: DUONEB Take 3 mLs by nebulization every 6 (six) hours as needed.   loperamide 2 MG tablet Commonly known as: IMODIUM A-D Take 2-4 mg by mouth 4 (four) times daily as needed for diarrhea or loose stools.   metoprolol  tartrate 25 MG tablet Commonly known as: LOPRESSOR  Take 1 tablet (25 mg total) by mouth 2 (two) times daily.   pantoprazole  40 MG tablet Commonly known as: Protonix  Take 1 tablet (40 mg total) by mouth 2 (two) times daily before a meal.   thiamine  100 MG tablet Commonly known as: Vitamin B-1 Take 100 mg by mouth daily.        Follow-up Information     [name], MD. Schedule an appointment as soon as possible for a visit in 10 day(s).    Specialty: Family Medicine Contact information: [address] [place] [address] [phone]                Discharge Exam: Fredricka Weights   01/06/24 2028 01/06/24 2345  Weight: 117 kg 118.2 kg    General exam: Appears calm and comfortable.  No further episodes of overt bleeding appreciated.  No nausea, no vomiting, no abdominal pain.  Patient feeling ready to go home. Respiratory system: Good saturation on room air.  No using accessory muscles. Cardiovascular system: Rate controlled, no rubs, no gallops, no JVD. Gastrointestinal system: Abdomen is obese, nondistended, soft and nontender.  Positive bowel sounds appreciated on exam. Central nervous system: No focal neurological deficits. Extremities: Cyanosis or clubbing; trace to 1+ edema appreciated bilaterally (unchanged from baseline). Skin: No petechiae. Psychiatry: Judgement and insight appear normal. Mood & affect appropriate.   Condition at discharge: Stable and improved.  The results of significant diagnostics from this hospitalization (including imaging, microbiology, ancillary and laboratory) are listed below for reference.   Imaging Studies: CT ANGIO GI BLEED Result Date: 01/06/2024 EXAM: CTA ABDOMEN AND PELVIS WITH CONTRAST 01/06/2024 09:58:06 PM TECHNIQUE: CTA images of the abdomen and pelvis with intravenous contrast. 100 mL iohexol  (OMNIPAQUE ) 350 MG/ML injection. Three-dimensional MIP/volume  rendered formations were performed. Automated exposure control, iterative reconstruction, and/or weight based adjustment of the mA/kV was utilized to reduce the radiation dose to as low as reasonably achievable. COMPARISON: Chest CT 08/26/2023. CLINICAL HISTORY: Melena. FINDINGS: VASCULATURE: GI BLEED: No active extravasation of contrast within the GI tract. AORTA: Coronary artery and aortic atherosclerosis. No acute finding. No abdominal aortic aneurysm. No dissection. CELIAC TRUNK: No acute finding. No occlusion  or significant stenosis. SUPERIOR MESENTRIC ARTERY: No acute finding. No occlusion or significant stenosis. INFERIOR MESENTERIC ARTERY: No acute finding. No occlusion or significant stenosis. RENAL ARTERIES: No acute finding. No occlusion or significant stenosis. ILIAC ARTERIES: No acute finding. No occlusion or significant stenosis. ABDOMEN/PELVIS: LOWER CHEST: Moderate left pleural effusion, similar to prior chest CT. LIVER: The liver is unremarkable. GALLBLADDER AND BILE DUCTS: Gallbladder is unremarkable. No biliary ductal dilatation. SPLEEN: The spleen is unremarkable. PANCREAS: The pancreas is unremarkable. ADRENAL GLANDS: A fat-containing mass in the right adrenal gland measures 6.5 cm, stable since the prior study, compatible with myelolipoma. KIDNEYS, URETERS AND BLADDER: 5.4 cm mid pole right renal cyst. 6.6 cm left lower pole cyst. No stones in the kidneys or ureters. No hydronephrosis. No perinephric or periureteral stranding. Large diverticulum off the left side of the bladder. GI AND BOWEL: Stomach and duodenal sweep demonstrate no acute abnormality. Extensive colonic diverticulosis. No active diverticulitis. Normal appendix. There is no bowel obstruction. No abnormal bowel wall thickening or distension. REPRODUCTIVE: Extensive prostate calcifications. Prostatomegaly. PERITONEUM AND RETROPERITONEUM: No ascites or free air. LYMPH NODES: No lymphadenopathy. BONES AND SOFT TISSUES: No acute abnormality of the bones. No acute soft tissue abnormality. IMPRESSION: 1. No active GI bleeding. 2. Aortic atherosclerosis. 3. Extensive colonic diverticulosis without evidence of diverticulitis. 4. Moderate left pleural effusion, similar to prior chest CT. Electronically signed by: Franky Crease MD 01/06/2024 10:16 PM EST RP Workstation: [id number]    Microbiology: Results for orders placed or performed during the hospital encounter of 09/07/23  MRSA Next Gen by PCR, Nasal     Status: None   Collection Time:  09/08/23  4:17 AM   Specimen: Nasal Mucosa; Nasal Swab  Result Value Ref Range Status   MRSA by PCR Next Gen NOT DETECTED NOT DETECTED Final    Comment: (NOTE) The GeneXpert MRSA Assay (FDA approved for NASAL specimens only), is one component of a comprehensive MRSA colonization surveillance program. It is not intended to diagnose MRSA infection nor to guide or monitor treatment for MRSA infections. Test performance is not FDA approved in patients less than 73 years old. Performed at [facility], [address], [place], [place] [address]     Labs: CBC: Recent Labs  Lab 01/06/24 2034 01/07/24 0434 01/07/24 1100 01/08/24 0416  WBC 7.2 5.0  --  4.9  HGB 11.2* 9.3* 9.9* 9.6*  HCT 35.3* 30.0* 32.0* 31.0*  MCV 84.0 85.2  --  85.4  PLT 246 195  --  202   Basic Metabolic Panel: Recent Labs  Lab 01/06/24 2034 01/07/24 0434 01/08/24 0416  NA 137 138 139  K 4.1 3.9 4.2  CL 102 105 105  CO2 28 29 30   GLUCOSE 91 96 90  BUN 18 16 10   CREATININE 0.68 0.71 0.73  CALCIUM  9.0 8.4* 8.7*   Liver Function Tests: Recent Labs  Lab 01/06/24 2034  AST 15  ALT 9  ALKPHOS 65  BILITOT 0.5  PROT 6.8  ALBUMIN 3.7   CBG: No results for input(s): GLUCAP in the last 168 hours.  Discharge time spent:  35 minutes.  Signed: Eric Nunnery, MD [facility] 01/08/2024

## 2024-01-08 NOTE — Interval H&P Note (Signed)
 History and Physical Interval Note:  01/08/2024 12:00 PM  Raymond Hurst  has presented today for surgery, with the diagnosis of melena.  The various methods of treatment have been discussed with the patient and family. After consideration of risks, benefits and other options for treatment, the patient has consented to  Procedure(s): EGD (ESOPHAGOGASTRODUODENOSCOPY) (N/A) as a surgical intervention.  The patient's history has been reviewed, patient examined, no change in status, stable for surgery.  I have reviewed the patient's chart and labs.  Questions were answered to the patient's satisfaction.     Carlin MARLA Hasty

## 2024-01-08 NOTE — Progress Notes (Signed)
 Patient transported to endo for EGD

## 2024-01-08 NOTE — Op Note (Signed)
 [facility] Patient Name: Raymond Hurst Procedure Date: 01/08/2024 1:06 PM MRN: [id number] Date of Birth: 1950-10-16 Attending MD: Carlin POUR. Cindie , [place], [id number] CSN: [id number] Age: 73 Admit Type: Outpatient Procedure:                Upper GI endoscopy Indications:              Melena Providers:                Carlin POUR. Cindie, DO, Tammy Vaught, RN, Bascom Blush Referring MD:              Medicines:                See the Anesthesia note for documentation of the                            administered medications Complications:            No immediate complications. Estimated Blood Loss:     Estimated blood loss: none. Procedure:                Pre-Anesthesia Assessment:                           - The anesthesia plan was to use monitored                            anesthesia care (MAC).                           After obtaining informed consent, the endoscope was                            passed under direct vision. Throughout the                            procedure, the patient's blood pressure, pulse, and                            oxygen saturations were monitored continuously. The                            [id number] ([id number]) Upper was introduced through                            the mouth, and advanced to the second part of                            duodenum. The upper GI endoscopy was accomplished                            without difficulty. The patient tolerated the                            procedure well. Scope In: 1:20:35 PM Scope Out: 1:25:18  PM Total Procedure Duration: 0 hours 4 minutes 43 seconds  Findings:      The examined esophagus was normal.      Patchy mild inflammation was found in the gastric body and in the       gastric antrum. Biopsies were taken with a cold forceps for Helicobacter       pylori testing.      One non-bleeding cratered duodenal ulcer with a clean ulcer base       (Forrest Class III) was found  in the duodenal bulb. The lesion was 6-8       mm in largest dimension. Central protuberance noted, ?stigmata from       previous GDA coil embolization.      The second portion of the duodenum was normal. Impression:               - Normal esophagus.                           - Gastritis. Biopsied.                           - Non-bleeding duodenal ulcer with a clean ulcer                            base (Forrest Class III).                           - Normal second portion of the duodenum. Moderate Sedation:      Per Anesthesia Care Recommendation:           - Return patient to hospital ward for ongoing care.                           - Soft diet.                           - Continue to monitor H&H                           - IV PPI BID while inpatient, PO 40 mg BID upon DC                           - Avoid all NSAIDs                           - Okay to restart Eliquis  Procedure Code(s):        --- Professional ---                           [id number], Esophagogastroduodenoscopy, flexible,                            transoral; with biopsy, single or multiple Diagnosis Code(s):        --- Professional ---                           K29.70, Gastritis, unspecified, without bleeding  K26.9, Duodenal ulcer, unspecified as acute or                            chronic, without hemorrhage or perforation                           K92.1, Melena (includes Hematochezia) CPT copyright 2022 American Medical Association. All rights reserved. The codes documented in this report are preliminary and upon coder review may  be revised to meet current compliance requirements. Carlin POUR. Cindie, DO Carlin POUR. Cindie, DO 01/08/2024 1:37:29 PM This report has been signed electronically. Number of Addenda: 0

## 2024-01-08 NOTE — Transfer of Care (Signed)
 Immediate Anesthesia Transfer of Care Note  Patient: Raymond Hurst  Procedure(s) Performed: EGD (ESOPHAGOGASTRODUODENOSCOPY)  Patient Location: PACU  Anesthesia Type:General  Level of Consciousness: awake, alert , oriented, and patient cooperative  Airway & Oxygen Therapy: Patient Spontanous Breathing  Post-op Assessment: Report given to RN, Post -op Vital signs reviewed and stable, and Patient moving all extremities X 4  Post vital signs: Reviewed and stable  Last Vitals:  Vitals Value Taken Time  BP 97/56 1331  Temp 97.7 1331  Pulse 76 01/08/24 13:31  Resp 21 01/08/24 13:31  SpO2 98 % 01/08/24 13:31  Vitals shown include unfiled device data.  Last Pain:  Vitals:   01/08/24 1317  TempSrc:   PainSc: 0-No pain      Patients Stated Pain Goal: 5 (01/08/24 1301)  Complications: No notable events documented.

## 2024-01-08 NOTE — Progress Notes (Signed)
 Discharge paper work was gone over by myself music therapist) to Patient and wife at bedside. No questions were asked at this time. IV was removed by me with small amount of bleeding upon removal. I asked if Darren (mobility tech) and Physicist, medical) as this patients tech is on lunch break, would discharge the patient down to the main entrance by wheel chair

## 2024-01-08 NOTE — Progress Notes (Signed)
 Patient has arrived back to the room from Endo. Only concern and question that himself and wife have had is if they are getting discharged this afternoon.

## 2024-01-08 NOTE — Anesthesia Preprocedure Evaluation (Signed)
 Anesthesia Evaluation  Patient identified by MRN, date of birth, ID band Patient awake    Reviewed: Allergy & Precautions, H&P , NPO status , Patient's Chart, lab work & pertinent test results  Airway Mallampati: III  TM Distance: >3 FB Neck ROM: Full    Dental  (+) Missing   Pulmonary sleep apnea , former smoker   breath sounds clear to auscultation       Cardiovascular + DOE  Normal cardiovascular exam+ dysrhythmias Atrial Fibrillation  Rhythm:Irregular Rate:Tachycardia  Due to GI bleed pt can not be anticoagulated therefore is at significant risk for stroke   Neuro/Psych  PSYCHIATRIC DISORDERS      TIACVA    GI/Hepatic ,,,(+)     substance abuse  alcohol useGI bleed/hematemesis   Endo/Other    Class 3 obesity  Renal/GU negative Renal ROS  negative genitourinary   Musculoskeletal negative musculoskeletal ROS (+)    Abdominal   Peds negative pediatric ROS (+)  Hematology  (+) Blood dyscrasia, anemia   Anesthesia Other Findings   Reproductive/Obstetrics negative OB ROS                              Anesthesia Physical Anesthesia Plan  ASA: 4 and emergent  Anesthesia Plan: MAC   Post-op Pain Management:    Induction:   PONV Risk Score and Plan:   Airway Management Planned: Nasal Cannula  Additional Equipment:   Intra-op Plan:   Post-operative Plan:   Informed Consent: I have reviewed the patients History and Physical, chart, labs and discussed the procedure including the risks, benefits and alternatives for the proposed anesthesia with the patient or authorized representative who has indicated his/her understanding and acceptance.     Dental advisory given  Plan Discussed with: CRNA  Anesthesia Plan Comments:          Anesthesia Quick Evaluation

## 2024-01-08 NOTE — Plan of Care (Signed)

## 2024-01-09 ENCOUNTER — Encounter (HOSPITAL_COMMUNITY): Payer: Self-pay | Admitting: Internal Medicine

## 2024-01-09 NOTE — Anesthesia Postprocedure Evaluation (Signed)
 Anesthesia Post Note  Patient: Raymond Hurst  Procedure(s) Performed: EGD (ESOPHAGOGASTRODUODENOSCOPY)  Patient location during evaluation: Phase II Anesthesia Type: MAC Level of consciousness: awake Pain management: pain level controlled Vital Signs Assessment: post-procedure vital signs reviewed and stable Respiratory status: spontaneous breathing and respiratory function stable Cardiovascular status: blood pressure returned to baseline and stable Postop Assessment: no headache and no apparent nausea or vomiting Anesthetic complications: no Comments: Late entry   No notable events documented.   Last Vitals:  Vitals:   01/08/24 1330 01/08/24 1345  BP: (!) 97/56 117/82  Pulse: 76 62  Resp: (!) 21 (!) 24  Temp: 36.5 C 36.6 C  SpO2: 98% 99%    Last Pain:  Vitals:   01/08/24 1345  TempSrc:   PainSc: 0-No pain                 [name]

## 2024-01-10 ENCOUNTER — Encounter (HOSPITAL_COMMUNITY): Admission: RE | Payer: Self-pay | Source: Home / Self Care

## 2024-01-10 ENCOUNTER — Telehealth: Payer: Self-pay | Admitting: Oncology

## 2024-01-10 ENCOUNTER — Ambulatory Visit (HOSPITAL_COMMUNITY): Admission: RE | Admit: 2024-01-10 | Source: Home / Self Care | Admitting: Internal Medicine

## 2024-01-10 LAB — SURGICAL PATHOLOGY

## 2024-01-10 SURGERY — EGD (ESOPHAGOGASTRODUODENOSCOPY)
Anesthesia: Choice

## 2024-01-10 NOTE — Telephone Encounter (Signed)
Left voicemail with appointment details.

## 2024-01-12 ENCOUNTER — Inpatient Hospital Stay: Admitting: Nurse Practitioner

## 2024-01-12 ENCOUNTER — Inpatient Hospital Stay: Attending: Nurse Practitioner

## 2024-01-12 ENCOUNTER — Encounter: Payer: Self-pay | Admitting: Nurse Practitioner

## 2024-01-12 ENCOUNTER — Other Ambulatory Visit: Payer: Self-pay | Admitting: *Deleted

## 2024-01-12 VITALS — BP 98/62 | HR 77 | Temp 97.8°F | Resp 18 | Ht 72.0 in | Wt 263.8 lb

## 2024-01-12 VITALS — BP 99/51 | HR 71 | Temp 98.1°F | Resp 18

## 2024-01-12 DIAGNOSIS — C7972 Secondary malignant neoplasm of left adrenal gland: Secondary | ICD-10-CM | POA: Diagnosis not present

## 2024-01-12 DIAGNOSIS — C3412 Malignant neoplasm of upper lobe, left bronchus or lung: Secondary | ICD-10-CM

## 2024-01-12 DIAGNOSIS — Z79620 Long term (current) use of immunosuppressive biologic: Secondary | ICD-10-CM | POA: Diagnosis not present

## 2024-01-12 DIAGNOSIS — Z5112 Encounter for antineoplastic immunotherapy: Secondary | ICD-10-CM | POA: Diagnosis present

## 2024-01-12 DIAGNOSIS — C781 Secondary malignant neoplasm of mediastinum: Secondary | ICD-10-CM | POA: Diagnosis not present

## 2024-01-12 LAB — CMP (CANCER CENTER ONLY)
ALT: 5 U/L (ref 0–44)
AST: 14 U/L — ABNORMAL LOW (ref 15–41)
Albumin: 3.6 g/dL (ref 3.5–5.0)
Alkaline Phosphatase: 66 U/L (ref 38–126)
Anion gap: 10 (ref 5–15)
BUN: 14 mg/dL (ref 8–23)
CO2: 25 mmol/L (ref 22–32)
Calcium: 9.4 mg/dL (ref 8.9–10.3)
Chloride: 104 mmol/L (ref 98–111)
Creatinine: 0.91 mg/dL (ref 0.61–1.24)
GFR, Estimated: >60 mL/min (ref >60)
Glucose, Bld: 118 mg/dL — ABNORMAL HIGH (ref 70–99)
Potassium: 4 mmol/L (ref 3.5–5.1)
Sodium: 138 mmol/L (ref 135–145)
Total Bilirubin: 0.5 mg/dL (ref 0.0–1.2)
Total Protein: 6.7 g/dL (ref 6.5–8.1)

## 2024-01-12 LAB — CBC WITH DIFFERENTIAL (CANCER CENTER ONLY)
Abs Immature Granulocytes: 0.02 K/uL (ref 0.00–0.07)
Basophils Absolute: 0 K/uL (ref 0.0–0.1)
Basophils Relative: 1 %
Eosinophils Absolute: 0.1 K/uL (ref 0.0–0.5)
Eosinophils Relative: 2 %
HCT: 32.5 % — ABNORMAL LOW (ref 39.0–52.0)
Hemoglobin: 10.2 g/dL — ABNORMAL LOW (ref 13.0–17.0)
Immature Granulocytes: 0 %
Lymphocytes Relative: 9 %
Lymphs Abs: 0.5 K/uL — ABNORMAL LOW (ref 0.7–4.0)
MCH: 26.2 pg (ref 26.0–34.0)
MCHC: 31.4 g/dL (ref 30.0–36.0)
MCV: 83.5 fL (ref 80.0–100.0)
Monocytes Absolute: 0.4 K/uL (ref 0.1–1.0)
Monocytes Relative: 6 %
Neutro Abs: 5.2 K/uL (ref 1.7–7.7)
Neutrophils Relative %: 82 %
Platelet Count: 225 K/uL (ref 150–400)
RBC: 3.89 MIL/uL — ABNORMAL LOW (ref 4.22–5.81)
RDW: 16.2 % — ABNORMAL HIGH (ref 11.5–15.5)
WBC Count: 6.4 K/uL (ref 4.0–10.5)
nRBC: 0 % (ref 0.0–0.2)

## 2024-01-12 LAB — TSH: TSH: 3.83 u[IU]/mL (ref 0.350–4.500)

## 2024-01-12 MED ORDER — SODIUM CHLORIDE 0.9 % IV SOLN: INTRAVENOUS | Status: DC

## 2024-01-12 MED ORDER — SODIUM CHLORIDE 0.9 % IV SOLN
1500.0000 mg | Freq: Once | INTRAVENOUS | Status: AC
  Administered 2024-01-12: 1500 mg via INTRAVENOUS
  Filled 2024-01-12: qty 30

## 2024-01-12 NOTE — Patient Instructions (Signed)
 [facility] - A DEPT OF [facility]  Discharge Instructions: Thank you for choosing [facility] to provide your oncology and hematology care.   If you have a lab appointment with the Cancer Center, please go directly to the Cancer Center and check in at the registration area.   Wear comfortable clothing and clothing appropriate for easy access to any Portacath or PICC line.   We strive to give you quality time with your provider. You may need to reschedule your appointment if you arrive late (15 or more minutes).  Arriving late affects you and other patients whose appointments are after yours.  Also, if you miss three or more appointments without notifying the office, you may be dismissed from the clinic at the provider's discretion.      For prescription refill requests, have your pharmacy contact our office and allow 72 hours for refills to be completed.    Today you received the following chemotherapy and/or immunotherapy agents: imfinzi       To help prevent nausea and vomiting after your treatment, we encourage you to take your nausea medication as directed.  BELOW ARE SYMPTOMS THAT SHOULD BE REPORTED IMMEDIATELY: *FEVER GREATER THAN 100.4 F (38 C) OR HIGHER *CHILLS OR SWEATING *NAUSEA AND VOMITING THAT IS NOT CONTROLLED WITH YOUR NAUSEA MEDICATION *UNUSUAL SHORTNESS OF BREATH *UNUSUAL BRUISING OR BLEEDING *URINARY PROBLEMS (pain or burning when urinating, or frequent urination) *BOWEL PROBLEMS (unusual diarrhea, constipation, pain near the anus) TENDERNESS IN MOUTH AND THROAT WITH OR WITHOUT PRESENCE OF ULCERS (sore throat, sores in mouth, or a toothache) UNUSUAL RASH, SWELLING OR PAIN  UNUSUAL VAGINAL DISCHARGE OR ITCHING   Items with * indicate a potential emergency and should be followed up as soon as possible or go to the Emergency Department if any problems should occur.  Please show the CHEMOTHERAPY ALERT CARD or IMMUNOTHERAPY  ALERT CARD at check-in to the Emergency Department and triage nurse.  Should you have questions after your visit or need to cancel or reschedule your appointment, please contact [facility] - A DEPT OF [facility]  Dept: [phone]  and follow the prompts.  Office hours are 8:00 a.m. to 4:30 p.m. Monday - Friday. Please note that voicemails left after 4:00 p.m. may not be returned until the following business day.  We are closed weekends and major holidays. You have access to a nurse at all times for urgent questions. Please call the main number to the clinic Dept: [phone] and follow the prompts.   For any non-urgent questions, you may also contact your provider using MyChart. We now offer e-Visits for anyone 10 and older to request care online for non-urgent symptoms. For details visit [email or web address].   Also download the MyChart app! Go to the app store, search MyChart, open the app, select [facility], and log in with your MyChart username and password.

## 2024-01-12 NOTE — Progress Notes (Signed)
 [facility] OFFICE PROGRESS NOTE   Diagnosis: Non-small cell lung cancer  INTERVAL HISTORY:   Mr. Lariccia returns for follow-up.  He continues adjuvant Durvalumab .  He last received treatment 11/30/2023.  He did not keep the appointment for the next planned infusion.  He was hospitalized 01/06/2024 through 01/08/2024 with a GI bleed.  He underwent an upper endoscopy on 01/08/2024 with finding of 1 nonbleeding cratered duodenal ulcer with a clean ulcer base in the duodenal bulb.  Hemoglobin remained stable, no transfusion required.  It was recommended he continue a PPI twice a day and follow-up with gastroenterology, avoid nonsteroidals.  Hemoglobin at discharge 9.6.  No further black stools.  No diarrhea.  No abdominal pain.  No rash.  Stable dyspnea.  Cough is better.  He has a good appetite.  Objective:  Vital signs in last 24 hours:  Blood pressure 98/62, pulse 77, temperature 97.8 F (36.6 C), temperature source Temporal, resp. rate 18, height 6' (1.829 m), weight 263 lb 12.8 oz (119.7 kg), SpO2 96%.    HEENT: No thrush or ulcers. Resp: Distant breath sounds.  No respiratory distress. Cardio: Irregular. GI: No hepatosplenomegaly. Vascular: Trace lower leg edema bilaterally. Skin: No rash.   Lab Results:  Lab Results  Component Value Date   WBC 6.4 01/12/2024   HGB 10.2 (L) 01/12/2024   HCT 32.5 (L) 01/12/2024   MCV 83.5 01/12/2024   PLT 225 01/12/2024   NEUTROABS 5.2 01/12/2024    Imaging:  No results found.  Medications: I have reviewed the patient's current medications.  Assessment/Plan: Left lung mass CT chest 06/05/2023: Left upper lobe mass with invasion of the mediastinum and effacement of the left upper lobe and lower lobe pulmonary arteries, moderate left pleural effusion, small mediastinal lymph nodes, right adrenal mass favoring myolipoma, probable benign lucent lesion at the right scapula Diagnostic left thoracentesis 06/06/2023: Cytology-no  malignant cells identified Bronchoscopy with endobronchial ultrasound and biopsies 06/12/2023-lateral left mainstem bronchial irregularity extending into the left upper lobe airways narrowing the left upper lobe airways, no discrete endobronchial lesion.  Endobronchial ultrasound showed a left hilar mass with significant vascularity; enlargement at station 4L, 11L, 7.  Station 7, 4L and 11L lymph nodes negative for malignancy; left hilar mass/lymph node metastatic carcinoma; left upper lobe endobronchial brushing malignant cells present; left upper lobe endobronchial biopsy suspicious for malignancy; weak focal p63 positivity in the tumor cells, negative for TTF-1, synaptophysin and CD56, a poorly differentiated squamous cell carcinoma is favored; PD-L1 pending; foundation 1-HRD signature/microsatellite status/tumor mutation burden cannot be determined, IDH1 M880V. PET scan 06/20/2023-large left hilar mass primarily in the left upper lobe.  Left adrenal gland with hypermetabolic activity suspicious for early metastatic lesion. Brain MRI 06/30/2023-negative for metastatic disease Week 1 Taxol /carboplatin  07/10/2023 Radiation chest left adrenal metastasis 07/11/2023-09/01/2023 Week 2 Taxol /carboplatin  07/17/2023 Week 3 Taxol /carboplatin  07/24/2023 Week 4 Taxol /carboplatin  08/01/2023 Week 5 Taxol /carboplatin  08/08/2023 Week 6 Taxol /carboplatin  08/14/2023 Week 7 Taxol /carboplatin  08/23/2023 Bronchoscopy 08/28/2023 with biopsy of the left upper lobe mass-non-small cell carcinoma-adenocarcinoma favored; PD-L1 tumor proportion score 20% Thoracentesis 09/26/2023-1 L of fluid removed, no malignant cells Cycle 1 Durvalumab  10/04/2023 Cycle 2 Durvalumab  11/01/2023 Cycle 3 durvalumab  11/30/2023 Cycle 4 durvalumab  01/12/2024   2.  Hoarseness/cough secondary to #1-improved following vocal cord procedure 06/29/2023, Restylane injection into bilateral lateral thyroid  arytenoid muscles 07/31/2023 3.  Atrial fibrillation 4.  Lower  extremity edema 5.  Alcohol use 6.  Large external hemorrhoid on exam 07/17/2023 7.  Admission 09/07/2023 with acute upper GI  bleeding 09/09/2023 EGD-duodenal ulcer with a visible vessel treated with epinephrine  09/09/2023-IR coil embolization of the GDA 8.  Admission 01/06/2024 - 01/08/2024 with a GI bleed CT angio GI bleed-no active GI bleeding  Upper endoscopy 01/08/2024-1 nonbleeding cratered duodenal ulcer with a clean ulcer base in the duodenal bulb    Disposition: Mr. Goldfarb appears stable.  He continues monthly Durvalumab .  He is tolerating well.  Plan to proceed with another cycle today.  Restaging CT in approximately 2 months.  CBC and chemistry panel reviewed.  Labs are adequate for treatment.    He will return for follow-up and Durvalumab  in 4 weeks.  We are available to see him sooner if needed.    Olam Ned ANP/GNP-BC   01/12/2024  11:31 AM

## 2024-01-12 NOTE — Progress Notes (Signed)
 Patient seen by Dr. Arley Hof today  Vitals are within treatment parameters:Yes   Labs are within treatment parameters: Yes   Treatment plan has been signed: Yes   Per physician team, Patient is ready for treatment and there are NO modifications to the treatment plan.

## 2024-01-13 LAB — T4: T4, Total: 6.4 ug/dL (ref 4.5–12.0)

## 2024-01-14 ENCOUNTER — Other Ambulatory Visit: Payer: Self-pay

## 2024-01-16 ENCOUNTER — Other Ambulatory Visit: Payer: Self-pay

## 2024-01-16 ENCOUNTER — Inpatient Hospital Stay (HOSPITAL_COMMUNITY)
Admission: EM | Admit: 2024-01-16 | Discharge: 2024-02-21 | DRG: 356 | Disposition: A | Discharge status: Home / Self Care | Attending: Internal Medicine | Admitting: Internal Medicine

## 2024-01-16 ENCOUNTER — Encounter (HOSPITAL_COMMUNITY): Payer: Self-pay

## 2024-01-16 DIAGNOSIS — J449 Chronic obstructive pulmonary disease, unspecified: Secondary | ICD-10-CM | POA: Diagnosis present

## 2024-01-16 DIAGNOSIS — Z923 Personal history of irradiation: Secondary | ICD-10-CM

## 2024-01-16 DIAGNOSIS — E861 Hypovolemia: Secondary | ICD-10-CM | POA: Diagnosis present

## 2024-01-16 DIAGNOSIS — I9589 Other hypotension: Secondary | ICD-10-CM | POA: Diagnosis not present

## 2024-01-16 DIAGNOSIS — I10 Essential (primary) hypertension: Secondary | ICD-10-CM | POA: Diagnosis present

## 2024-01-16 DIAGNOSIS — I482 Chronic atrial fibrillation, unspecified: Secondary | ICD-10-CM | POA: Diagnosis present

## 2024-01-16 DIAGNOSIS — Z59868 Other specified financial insecurity: Secondary | ICD-10-CM

## 2024-01-16 DIAGNOSIS — Z751 Person awaiting admission to adequate facility elsewhere: Secondary | ICD-10-CM

## 2024-01-16 DIAGNOSIS — K5710 Diverticulosis of small intestine without perforation or abscess without bleeding: Secondary | ICD-10-CM | POA: Diagnosis present

## 2024-01-16 DIAGNOSIS — Z7951 Long term (current) use of inhaled steroids: Secondary | ICD-10-CM

## 2024-01-16 DIAGNOSIS — Z8673 Personal history of transient ischemic attack (TIA), and cerebral infarction without residual deficits: Secondary | ICD-10-CM

## 2024-01-16 DIAGNOSIS — N179 Acute kidney failure, unspecified: Secondary | ICD-10-CM | POA: Diagnosis present

## 2024-01-16 DIAGNOSIS — E872 Acidosis, unspecified: Secondary | ICD-10-CM | POA: Diagnosis present

## 2024-01-16 DIAGNOSIS — R188 Other ascites: Secondary | ICD-10-CM | POA: Diagnosis present

## 2024-01-16 DIAGNOSIS — K254 Chronic or unspecified gastric ulcer with hemorrhage: Principal | ICD-10-CM | POA: Diagnosis present

## 2024-01-16 DIAGNOSIS — Z825 Family history of asthma and other chronic lower respiratory diseases: Secondary | ICD-10-CM

## 2024-01-16 DIAGNOSIS — D1779 Benign lipomatous neoplasm of other sites: Secondary | ICD-10-CM | POA: Diagnosis present

## 2024-01-16 DIAGNOSIS — E876 Hypokalemia: Secondary | ICD-10-CM | POA: Diagnosis present

## 2024-01-16 DIAGNOSIS — I82621 Acute embolism and thrombosis of deep veins of right upper extremity: Secondary | ICD-10-CM | POA: Diagnosis not present

## 2024-01-16 DIAGNOSIS — I890 Lymphedema, not elsewhere classified: Secondary | ICD-10-CM | POA: Diagnosis present

## 2024-01-16 DIAGNOSIS — K5732 Diverticulitis of large intestine without perforation or abscess without bleeding: Secondary | ICD-10-CM | POA: Diagnosis present

## 2024-01-16 DIAGNOSIS — K5900 Constipation, unspecified: Secondary | ICD-10-CM | POA: Diagnosis not present

## 2024-01-16 DIAGNOSIS — Z86718 Personal history of other venous thrombosis and embolism: Secondary | ICD-10-CM

## 2024-01-16 DIAGNOSIS — G4700 Insomnia, unspecified: Secondary | ICD-10-CM | POA: Diagnosis present

## 2024-01-16 DIAGNOSIS — Z7901 Long term (current) use of anticoagulants: Secondary | ICD-10-CM

## 2024-01-16 DIAGNOSIS — E8770 Fluid overload, unspecified: Secondary | ICD-10-CM | POA: Diagnosis present

## 2024-01-16 DIAGNOSIS — J910 Malignant pleural effusion: Secondary | ICD-10-CM | POA: Diagnosis present

## 2024-01-16 DIAGNOSIS — E66813 Obesity, class 3: Secondary | ICD-10-CM | POA: Diagnosis present

## 2024-01-16 DIAGNOSIS — D6959 Other secondary thrombocytopenia: Secondary | ICD-10-CM | POA: Diagnosis not present

## 2024-01-16 DIAGNOSIS — E440 Moderate protein-calorie malnutrition: Secondary | ICD-10-CM | POA: Diagnosis present

## 2024-01-16 DIAGNOSIS — A411 Sepsis due to other specified staphylococcus: Secondary | ICD-10-CM | POA: Diagnosis not present

## 2024-01-16 DIAGNOSIS — R262 Difficulty in walking, not elsewhere classified: Principal | ICD-10-CM

## 2024-01-16 DIAGNOSIS — K922 Gastrointestinal hemorrhage, unspecified: Principal | ICD-10-CM | POA: Diagnosis present

## 2024-01-16 DIAGNOSIS — D62 Acute posthemorrhagic anemia: Secondary | ICD-10-CM | POA: Diagnosis present

## 2024-01-16 DIAGNOSIS — Z8249 Family history of ischemic heart disease and other diseases of the circulatory system: Secondary | ICD-10-CM

## 2024-01-16 DIAGNOSIS — Z87891 Personal history of nicotine dependence: Secondary | ICD-10-CM

## 2024-01-16 DIAGNOSIS — E8771 Transfusion associated circulatory overload: Secondary | ICD-10-CM | POA: Diagnosis not present

## 2024-01-16 DIAGNOSIS — R578 Other shock: Secondary | ICD-10-CM | POA: Diagnosis present

## 2024-01-16 DIAGNOSIS — G4733 Obstructive sleep apnea (adult) (pediatric): Secondary | ICD-10-CM | POA: Diagnosis present

## 2024-01-16 DIAGNOSIS — Z9221 Personal history of antineoplastic chemotherapy: Secondary | ICD-10-CM

## 2024-01-16 DIAGNOSIS — K2970 Gastritis, unspecified, without bleeding: Secondary | ICD-10-CM | POA: Diagnosis present

## 2024-01-16 DIAGNOSIS — K3189 Other diseases of stomach and duodenum: Secondary | ICD-10-CM | POA: Diagnosis present

## 2024-01-16 DIAGNOSIS — E870 Hyperosmolality and hypernatremia: Secondary | ICD-10-CM | POA: Diagnosis present

## 2024-01-16 DIAGNOSIS — C3412 Malignant neoplasm of upper lobe, left bronchus or lung: Secondary | ICD-10-CM | POA: Diagnosis present

## 2024-01-16 DIAGNOSIS — K659 Peritonitis, unspecified: Secondary | ICD-10-CM | POA: Diagnosis present

## 2024-01-16 DIAGNOSIS — I82629 Acute embolism and thrombosis of deep veins of unspecified upper extremity: Secondary | ICD-10-CM

## 2024-01-16 DIAGNOSIS — Z9189 Other specified personal risk factors, not elsewhere classified: Secondary | ICD-10-CM

## 2024-01-16 DIAGNOSIS — R339 Retention of urine, unspecified: Secondary | ICD-10-CM | POA: Diagnosis present

## 2024-01-16 DIAGNOSIS — K264 Chronic or unspecified duodenal ulcer with hemorrhage: Secondary | ICD-10-CM | POA: Diagnosis present

## 2024-01-16 DIAGNOSIS — Z79899 Other long term (current) drug therapy: Secondary | ICD-10-CM

## 2024-01-16 DIAGNOSIS — I4821 Permanent atrial fibrillation: Secondary | ICD-10-CM | POA: Diagnosis present

## 2024-01-16 DIAGNOSIS — I471 Supraventricular tachycardia, unspecified: Secondary | ICD-10-CM | POA: Diagnosis not present

## 2024-01-16 DIAGNOSIS — E8809 Other disorders of plasma-protein metabolism, not elsewhere classified: Secondary | ICD-10-CM | POA: Diagnosis present

## 2024-01-16 DIAGNOSIS — C7971 Secondary malignant neoplasm of right adrenal gland: Secondary | ICD-10-CM | POA: Diagnosis present

## 2024-01-16 DIAGNOSIS — Z6841 Body Mass Index (BMI) 40.0 and over, adult: Secondary | ICD-10-CM

## 2024-01-16 HISTORY — DX: Malignant (primary) neoplasm, unspecified: C80.1

## 2024-01-16 LAB — I-STAT CHEM 8, ED
BUN: 27 mg/dL — ABNORMAL HIGH (ref 8–23)
Calcium, Ion: 1.07 mmol/L — ABNORMAL LOW (ref 1.15–1.40)
Chloride: 104 mmol/L (ref 98–111)
Creatinine, Ser: 0.9 mg/dL (ref 0.61–1.24)
Glucose, Bld: 172 mg/dL — ABNORMAL HIGH (ref 70–99)
HCT: 24 % — ABNORMAL LOW (ref 39.0–52.0)
Hemoglobin: 8.2 g/dL — ABNORMAL LOW (ref 13.0–17.0)
Potassium: 3.6 mmol/L (ref 3.5–5.1)
Sodium: 137 mmol/L (ref 135–145)
TCO2: 20 mmol/L — ABNORMAL LOW (ref 22–32)

## 2024-01-16 LAB — CBC WITH DIFFERENTIAL/PLATELET
Abs Immature Granulocytes: 0.03 K/uL (ref 0.00–0.07)
Basophils Absolute: 0.1 K/uL (ref 0.0–0.1)
Basophils Relative: 1 %
Eosinophils Absolute: 0.1 K/uL (ref 0.0–0.5)
Eosinophils Relative: 2 %
HCT: 25.4 % — ABNORMAL LOW (ref 39.0–52.0)
Hemoglobin: 7.7 g/dL — ABNORMAL LOW (ref 13.0–17.0)
Immature Granulocytes: 0 %
Lymphocytes Relative: 10 %
Lymphs Abs: 0.7 K/uL (ref 0.7–4.0)
MCH: 26.5 pg (ref 26.0–34.0)
MCHC: 30.3 g/dL (ref 30.0–36.0)
MCV: 87.3 fL (ref 80.0–100.0)
Monocytes Absolute: 0.5 K/uL (ref 0.1–1.0)
Monocytes Relative: 7 %
Neutro Abs: 5.4 K/uL (ref 1.7–7.7)
Neutrophils Relative %: 80 %
Platelets: 257 K/uL (ref 150–400)
RBC: 2.91 MIL/uL — ABNORMAL LOW (ref 4.22–5.81)
RDW: 16.7 % — ABNORMAL HIGH (ref 11.5–15.5)
WBC: 6.8 K/uL (ref 4.0–10.5)
nRBC: 0 % (ref 0.0–0.2)

## 2024-01-16 LAB — COMPREHENSIVE METABOLIC PANEL WITH GFR
ALT: 6 U/L (ref 0–44)
AST: 13 U/L — ABNORMAL LOW (ref 15–41)
Albumin: 3.5 g/dL (ref 3.5–5.0)
Alkaline Phosphatase: 49 U/L (ref 38–126)
Anion gap: 15 (ref 5–15)
BUN: 28 mg/dL — ABNORMAL HIGH (ref 8–23)
CO2: 19 mmol/L — ABNORMAL LOW (ref 22–32)
Calcium: 8.4 mg/dL — ABNORMAL LOW (ref 8.9–10.3)
Chloride: 103 mmol/L (ref 98–111)
Creatinine, Ser: 0.79 mg/dL (ref 0.61–1.24)
GFR, Estimated: >60 mL/min (ref >60)
Glucose, Bld: 178 mg/dL — ABNORMAL HIGH (ref 70–99)
Potassium: 3.6 mmol/L (ref 3.5–5.1)
Sodium: 137 mmol/L (ref 135–145)
Total Bilirubin: 0.6 mg/dL (ref 0.0–1.2)
Total Protein: 5.7 g/dL — ABNORMAL LOW (ref 6.5–8.1)

## 2024-01-16 LAB — PREPARE RBC (CROSSMATCH)

## 2024-01-16 MED ORDER — SODIUM CHLORIDE 0.9% IV SOLUTION: Freq: Once | INTRAVENOUS | Status: DC

## 2024-01-16 MED ORDER — PANTOPRAZOLE SODIUM 40 MG IV SOLR
40.0000 mg | Freq: Two times a day (BID) | INTRAVENOUS | Status: DC
  Filled 2024-01-16: qty 10

## 2024-01-16 MED ORDER — IPRATROPIUM-ALBUTEROL 0.5-2.5 (3) MG/3ML IN SOLN: 3.0000 mL | RESPIRATORY_TRACT | Status: DC | PRN

## 2024-01-16 MED ORDER — VITAMIN K1 10 MG/ML IJ SOLN: 10.0000 mg | INTRAVENOUS | Status: DC

## 2024-01-16 MED ORDER — ORAL CARE MOUTH RINSE: 15.0000 mL | OROMUCOSAL | Status: DC | PRN

## 2024-01-16 MED ORDER — PROTHROMBIN COMPLEX CONC HUMAN 500 UNITS IV KIT: 1500.0000 [IU] | PACK | Status: DC

## 2024-01-16 MED ORDER — PROTHROMBIN COMPLEX CONC HUMAN 500 UNITS IV KIT
2149.0000 [IU] | PACK | Status: AC
  Administered 2024-01-16: 18:00:00 2149 [IU] via INTRAVENOUS
  Filled 2024-01-16: qty 2149

## 2024-01-16 MED ORDER — SODIUM CHLORIDE 0.9 % IV BOLUS
500.0000 mL | Freq: Once | INTRAVENOUS | Status: AC
  Administered 2024-01-16: 16:00:00 500 mL via INTRAVENOUS

## 2024-01-16 MED ORDER — ONDANSETRON HCL 4 MG/2ML IJ SOLN
4.0000 mg | Freq: Four times a day (QID) | INTRAMUSCULAR | Status: DC | PRN
  Administered 2024-02-11: 4 mg via INTRAVENOUS
  Filled 2024-01-16: qty 2

## 2024-01-16 MED ORDER — SODIUM CHLORIDE 0.9 % IV BOLUS
1000.0000 mL | Freq: Once | INTRAVENOUS | Status: AC
  Administered 2024-01-16: 17:00:00 1000 mL via INTRAVENOUS

## 2024-01-16 MED ORDER — PANTOPRAZOLE SODIUM 40 MG IV SOLR
40.0000 mg | Freq: Once | INTRAVENOUS | Status: AC
  Administered 2024-01-16: 17:00:00 40 mg via INTRAVENOUS
  Filled 2024-01-16: qty 10

## 2024-01-16 MED ORDER — ONDANSETRON HCL 4 MG/2ML IJ SOLN
4.0000 mg | Freq: Once | INTRAMUSCULAR | Status: AC
  Administered 2024-01-16: 17:00:00 4 mg via INTRAVENOUS
  Filled 2024-01-16: qty 2

## 2024-01-16 NOTE — ED Triage Notes (Signed)
 Pt [facility] From home for bright red hematemesis today. H/x stage 4 lung CA. Pt takes eliquis . Initial pressures 70/50. 94/60 after 500mL NS.  HR 95 a fib 18g RAC 20g LAC

## 2024-01-16 NOTE — H&P (Signed)
 History and Physical    Patient: Raymond Hurst DOB: Jan 10, 1951 DOA: 01/16/2024 DOS: the patient was seen and examined on 01/16/2024 PCP: [name], MD  Patient coming from: Home  Chief Complaint: Hematemesis Chief Complaint  Patient presents with   Hematemesis   HPI: Raymond Hurst is a 73 y.o. male with medical history significant of COPD, atrial fibrillation on Eliquis , OSA on CPAP, CVA, history of GI bleed due to gastric ulcer, chronic lymphedema, who was otherwise well until today when he started experiencing acute hematemesis.  Patient was recently admitted here from 01/06/2024 and was discharged on 01/08/2024 after he presented with bloody stool.  During that time patient underwent EGD that showed clean-based gastric ulcer without signs of active bleeding and was cleared by GI to resume his regular dose of Eliquis .  Patient has been feeling relatively fine with no bleeding until today when he started feeling unwell with associated dizziness and nausea and subsequently had about a cupful of bright red vomitus and therefore brought to the emergency room for further management.  According to him he only had 1 episode of hematemesis before EMS was called and has since not had any other episode. At the time patient was being seen he denied chest pain, worsening abdominal pain, constipation, melena, or urinary complaints  ED course: Upon arrival to the emergency room patient blood pressure was in the 80s received IV fluid which later improved into the 90s.  Temperature 98.1, pulse 89, respiratory rate 18, saturating 99% on room air ED physician discussed with gastroenterologist on-call who recommended for patient to be admitted and for a unit of blood to be transfused.  Patient also received vitamin K in the emergency room as well as PPI.  Given above concerns hospitalist service was therefore contacted to admit patient for further management.  Review of Systems: As  mentioned in the history of present illness. All other systems reviewed and are negative. 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  He had nocturnal hypoxemia with O2 sat nadir of 70%. Now on CPAP   Pulmonary mass    Stroke [facility])    Past Surgical History:  Procedure Laterality Date   BRONCHIAL BIOPSY  06/12/2023   Procedure: BRONCHOSCOPY, WITH BIOPSY;  Surgeon: Shelah Lamar RAMAN, MD;  Location: [facility] ENDOSCOPY;  Service: Cardiopulmonary;;   BRONCHIAL BRUSHINGS  06/12/2023   Procedure: BRONCHOSCOPY, WITH BRUSH BIOPSY;  Surgeon: Shelah Lamar RAMAN, MD;  Location: [facility] ENDOSCOPY;  Service: Cardiopulmonary;;   BRONCHIAL NEEDLE ASPIRATION BIOPSY  06/12/2023   Procedure: BRONCHOSCOPY, WITH NEEDLE ASPIRATION BIOPSY;  Surgeon: Shelah Lamar RAMAN, MD;  Location: [facility] ENDOSCOPY;  Service: Cardiopulmonary;;   BRONCHIAL NEEDLE ASPIRATION BIOPSY  08/28/2023   Procedure: BRONCHOSCOPY, WITH NEEDLE ASPIRATION BIOPSY;  Surgeon: Shelah Lamar RAMAN, MD;  Location: [facility] ENDOSCOPY;  Service: Pulmonary;;   CRYOTHERAPY  08/28/2023   Procedure: CRYOTHERAPY;  Surgeon: Shelah Lamar RAMAN, MD;  Location: [facility]  ENDOSCOPY;  Service: Pulmonary;;   ESOPHAGOGASTRODUODENOSCOPY N/A 09/09/2023   Procedure: EGD (ESOPHAGOGASTRODUODENOSCOPY);  Surgeon: Shaaron Lamar HERO, MD;  Location: [facility] ENDO SUITE;  Service: Endoscopy;  Laterality: N/A;   ESOPHAGOGASTRODUODENOSCOPY N/A 01/08/2024   Procedure: EGD (ESOPHAGOGASTRODUODENOSCOPY);  Surgeon: Cindie Carlin POUR, DO;  Location: [facility] ENDO SUITE;  Service: Endoscopy;  Laterality: N/A;    HEMOSTASIS CLIP PLACEMENT  06/12/2023   Procedure: CONTROL OF HEMORRHAGE bronch cold saline and epi;  Surgeon: Shelah Lamar RAMAN, MD;  Location: [facility] ENDOSCOPY;  Service: Cardiopulmonary;;   INNER EAR SURGERY     IR ANGIOGRAM SELECTIVE EACH ADDITIONAL VESSEL  09/09/2023   IR ANGIOGRAM VISCERAL SELECTIVE  09/09/2023   IR ANGIOGRAM VISCERAL SELECTIVE  09/09/2023   IR EMBO ART  VEN HEMORR LYMPH EXTRAV  INC GUIDE ROADMAPPING  09/09/2023   IR THORACENTESIS ASP PLEURAL SPACE W/IMG GUIDE  09/26/2023   IR US  GUIDE VASC ACCESS RIGHT  09/09/2023   KNEE SURGERY Left    THORACENTESIS Left 06/06/2023   Procedure: THORACENTESIS;  Surgeon: Arlinda Ranks, MD;  Location: [facility] ENDOSCOPY;  Service: Pulmonary;  Laterality: Left;   TONSILLECTOMY     VIDEO BRONCHOSCOPY WITH ENDOBRONCHIAL NAVIGATION Left 08/28/2023   Procedure: VIDEO BRONCHOSCOPY WITH ENDOBRONCHIAL NAVIGATION;  Surgeon: Shelah Lamar RAMAN, MD;  Location: [facility] ENDOSCOPY;  Service: Pulmonary;  Laterality: Left;   VIDEO BRONCHOSCOPY WITH ENDOBRONCHIAL ULTRASOUND Left 06/12/2023   Procedure: BRONCHOSCOPY, WITH EBUS;  Surgeon: Shelah Lamar RAMAN, MD;  Location: [facility] ENDOSCOPY;  Service: Cardiopulmonary;  Laterality: Left;   VIDEO BRONCHOSCOPY WITH ENDOBRONCHIAL ULTRASOUND  08/28/2023   Procedure: BRONCHOSCOPY, WITH EBUS;  Surgeon: Shelah Lamar RAMAN, MD;  Location: [facility] ENDOSCOPY;  Service: Pulmonary;;   Social History:  reports that he quit smoking about 33 years ago. His smoking use included cigarettes. He started smoking about 53 years ago. He has a 40 pack-year smoking history. He has never used smokeless tobacco. He reports that he does not currently use alcohol. He reports that he does not use drugs.  Allergies[1]  Family History  Problem Relation Age of Onset   Heart failure Mother    COPD Mother    Atrial fibrillation Mother    Idiopathic pulmonary fibrosis Father     Prior to Admission medications  Medication Sig Start Date End Date Taking? Authorizing Provider   acetaminophen  (TYLENOL ) 500 MG tablet Take 500 mg by mouth every 6 (six) hours as needed for mild pain. Patient not taking: Reported on 01/12/2024    [provider]  allopurinol  (ZYLOPRIM ) 100 MG tablet Take 100 mg by mouth daily. 05/25/22   [provider]  apixaban  (ELIQUIS ) 5 MG TABS tablet Take 1 tablet (5 mg total) by mouth 2 (two) times daily. Okay to restart this medication on 08/29/2023 01/09/24   Ricky Fines, MD  budesonide -glycopyrrolate -formoterol  (BREZTRI  AEROSPHERE) 160-9-4.8 MCG/ACT AERO inhaler Inhale 2 puffs into the lungs in the morning and at bedtime. 10/11/23   Hattar, Zola SAILOR, MD  cholecalciferol  (VITAMIN D3) 25 MCG (1000 UNIT) tablet Take 1,000 Units by mouth daily.    [provider]  cyanocobalamin  (VITAMIN B12) 100 MCG tablet Take 100 mcg by mouth daily.    [provider]  folic acid  (FOLVITE ) 1 MG tablet TAKE 1 TABLET BY MOUTH EVERY DAY 12/07/23   Rourk, Lamar HERO, MD  furosemide  (LASIX ) 40 MG tablet Take 20 mg by mouth daily.    [provider]  ipratropium-albuterol  (DUONEB) 0.5-2.5 (3) MG/3ML SOLN Take 3 mLs by nebulization every 6 (six) hours  as needed. 10/31/23   Hattar, Zola SAILOR, MD  loperamide  (IMODIUM  A-D) 2 MG tablet Take 2-4 mg by mouth 4 (four) times daily as needed for diarrhea or loose stools. Patient not taking: Reported on 01/12/2024    [provider]  metoprolol  tartrate (LOPRESSOR ) 25 MG tablet Take 1 tablet (25 mg total) by mouth 2 (two) times daily. 11/17/21   Strader, Laymon HERO, PA-C  pantoprazole  (PROTONIX ) 40 MG tablet Take 1 tablet (40 mg total) by mouth 2 (two) times daily before a meal. 10/17/23   Rourk, Lamar HERO, MD  thiamine  (VITAMIN B-1) 100 MG tablet Take 100 mg by mouth daily.    [provider]    Physical Exam: Vitals:   01/16/24 1631 01/16/24 1645 01/16/24 1700 01/16/24 1715  BP:  (!) 91/57 (!) 125/56 (!) 110/57  Pulse:   81 85  Resp:  17 18 17   Temp: 98.1 F (36.7 C)      TempSrc: Tympanic     SpO2:   91% 93%  Weight:      Height:       General - Elderly  male, laying in bed in no acute distress  HEENT - PERRLA, EOMI, atraumatic head, non tender sinuses. Lung -clear to auscultation Heart - S1, S2 heard, no murmurs, rubs Abdomen - Soft, non tender, bowel sounds good Neuro - Alert, awake and oriented x 3, non focal exam. Skin - Warm and dry. Musculoskeletal: Bilateral lower extremity lymphedema noted  Data Reviewed:      Latest Ref Rng & Units 01/16/2024    5:08 PM 01/16/2024    4:23 PM 01/12/2024   10:48 AM  CBC  WBC 4.0 - 10.5 K/uL  6.8  6.4   Hemoglobin 13.0 - 17.0 g/dL 8.2  7.7  89.7   Hematocrit 39.0 - 52.0 % 24.0  25.4  32.5   Platelets 150 - 400 K/uL  257  225        Latest Ref Rng & Units 01/16/2024    5:08 PM 01/16/2024    4:23 PM 01/12/2024   10:48 AM  BMP  Glucose 70 - 99 mg/dL 827  821  881   BUN 8 - 23 mg/dL 27  28  14    Creatinine 0.61 - 1.24 mg/dL 9.09  9.20  9.08   Sodium 135 - 145 mmol/L 137  137  138   Potassium 3.5 - 5.1 mmol/L 3.6  3.6  4.0   Chloride 98 - 111 mmol/L 104  103  104   CO2 22 - 32 mmol/L  19  25   Calcium  8.9 - 10.3 mg/dL  8.4  9.4      Assessment and Plan:  Acute GI bleed likely secondary to upper GI bleed due to gastric ulcer in a patient on Eliquis  We will hold all anticoagulants Patient was recently admitted here from 01/06/2024 and was discharged on 01/08/2024 after he presented with bloody stool.  During that time patient underwent EGD that showed clean-based gastric ulcer without signs of active bleeding  GI consulted in the emergency room Placed on PPI therapy Presented with hemoglobin of 7.7 1 unit of blood transfusion began in the emergency room Monitor CBC closely We will keep patient in stepdown for closer monitoring I will keep n.p.o. at this time  COPD-not in acute exacerbation Continue as needed nebs  Atrial fibrillation on Eliquis  We will hold Eliquis  at this time Currently  rate controlled  OSA on CPAP Continue CPAP at night  Chronic lymphedema Continue stockings   Advance Care Planning:   Code Status: Prior full code  Consults: None  Family Communication: None present at bedside  Severity of Illness: The appropriate patient status for this patient is OBSERVATION. Observation status is judged to be reasonable and necessary in order to provide the required intensity of service to ensure the patient's safety. The patient's presenting symptoms, physical exam findings, and initial radiographic and laboratory data in the context of their medical condition is felt to place them at decreased risk for further clinical deterioration. Furthermore, it is anticipated that the patient will be medically stable for discharge from the hospital within 2 midnights of admission.   Author: Drue ONEIDA Potter, MD 01/16/2024 5:29 PM  For on call review [email or web address].      [1] No Known Allergies

## 2024-01-16 NOTE — Progress Notes (Signed)
 Patient has declined use of BIPAP at this time;stated he does use one at home but interferers with his sleep. RT has a unit on standby at this time when patient needs and explained RT will place on device when needed.

## 2024-01-16 NOTE — ED Provider Notes (Signed)
  EMERGENCY DEPARTMENT AT [facility] Provider Note   CSN: [id number] Arrival date & time: 01/16/24  1613     Patient presents with: Hematemesis   Raymond Hurst is a 73 y.o. male.  {Add pertinent medical, surgical, social history, OB history to YEP:67052} Patient is on Eliquis  and complains of vomiting blood.  He recently had an EGD that looked okay and he started back on his Eliquis .   Emesis      Prior to Admission medications  Medication Sig Start Date End Date Taking? Authorizing Provider  acetaminophen  (TYLENOL ) 500 MG tablet Take 500 mg by mouth every 6 (six) hours as needed for mild pain. Patient not taking: Reported on 01/12/2024    [provider]  allopurinol  (ZYLOPRIM ) 100 MG tablet Take 100 mg by mouth daily. 05/25/22   [provider]  apixaban  (ELIQUIS ) 5 MG TABS tablet Take 1 tablet (5 mg total) by mouth 2 (two) times daily. Okay to restart this medication on 08/29/2023 01/09/24   Ricky Fines, MD  budesonide -glycopyrrolate -formoterol  (BREZTRI  AEROSPHERE) 160-9-4.8 MCG/ACT AERO inhaler Inhale 2 puffs into the lungs in the morning and at bedtime. 10/11/23   Hattar, Zola SAILOR, MD  cholecalciferol  (VITAMIN D3) 25 MCG (1000 UNIT) tablet Take 1,000 Units by mouth daily.    [provider]  cyanocobalamin  (VITAMIN B12) 100 MCG tablet Take 100 mcg by mouth daily.    [provider]  folic acid  (FOLVITE ) 1 MG tablet TAKE 1 TABLET BY MOUTH EVERY DAY 12/07/23   Rourk, Lamar HERO, MD  furosemide  (LASIX ) 40 MG tablet Take 20 mg by mouth daily.    [provider]  ipratropium-albuterol  (DUONEB) 0.5-2.5 (3) MG/3ML SOLN Take 3 mLs by nebulization every 6 (six) hours as needed. 10/31/23   Hattar, Zola SAILOR, MD  loperamide  (IMODIUM  A-D) 2 MG tablet Take 2-4 mg by mouth 4 (four) times daily as needed for diarrhea or loose stools. Patient not taking: Reported on 01/12/2024    [provider]  metoprolol  tartrate  (LOPRESSOR ) 25 MG tablet Take 1 tablet (25 mg total) by mouth 2 (two) times daily. 11/17/21   Strader, Laymon HERO, PA-C  pantoprazole  (PROTONIX ) 40 MG tablet Take 1 tablet (40 mg total) by mouth 2 (two) times daily before a meal. 10/17/23   Rourk, Lamar HERO, MD  thiamine  (VITAMIN B-1) 100 MG tablet Take 100 mg by mouth daily.    [provider]    Allergies: Patient has no known allergies.    Review of Systems  Gastrointestinal:  Positive for vomiting.    Updated Vital Signs BP (!) 110/57   Pulse 85   Temp 98.1 F (36.7 C) (Tympanic)   Resp 17   Ht 6' (1.829 m)   Wt 119.7 kg   SpO2 93%   BMI 35.78 kg/m   Physical Exam  (all labs ordered are listed, but only abnormal results are displayed) Labs Reviewed  CBC WITH DIFFERENTIAL/PLATELET - Abnormal; Notable for the following components:      Result Value   RBC 2.91 (*)    Hemoglobin 7.7 (*)    HCT 25.4 (*)    RDW 16.7 (*)    All other components within normal limits  COMPREHENSIVE METABOLIC PANEL WITH GFR - Abnormal; Notable for the following components:   CO2 19 (*)    Glucose, Bld 178 (*)    BUN 28 (*)    Calcium  8.4 (*)    Total Protein 5.7 (*)  AST 13 (*)    All other components within normal limits  I-STAT CHEM 8, ED - Abnormal; Notable for the following components:   BUN 27 (*)    Glucose, Bld 172 (*)    Calcium , Ion 1.07 (*)    TCO2 20 (*)    Hemoglobin 8.2 (*)    HCT 24.0 (*)    All other components within normal limits  BASIC METABOLIC PANEL WITH GFR  CBC  TYPE AND SCREEN  PREPARE RBC (CROSSMATCH)    EKG: None  Radiology: No results found.  {Document cardiac monitor, telemetry assessment procedure when appropriate:32947} Procedures   Medications Ordered in the ED  0.9 %  sodium chloride  infusion (Manually program via Guardrails IV Fluids) (has no administration in time range)  prothrombin  complex conc human (KCENTRA ) IVPB 2,149 Units (has no administration in time range)  sodium  chloride 0.9 % bolus 500 mL (500 mLs Intravenous New Bag/Given 01/16/24 1629)  pantoprazole  (PROTONIX ) injection 40 mg (40 mg Intravenous Given 01/16/24 1635)  ondansetron  (ZOFRAN ) injection 4 mg (4 mg Intravenous Given 01/16/24 1635)  sodium chloride  0.9 % bolus 1,000 mL (0 mLs Intravenous Stopped 01/16/24 1656)   CRITICAL CARE Performed by: Fairy Sermon Total critical care time: 45 minutes Critical care time was exclusive of separately billable procedures and treating other patients. Critical care was necessary to treat or prevent imminent or life-threatening deterioration. Critical care was time spent personally by me on the following activities: development of treatment plan with patient and/or surrogate as well as nursing, discussions with consultants, evaluation of patient's response to treatment, examination of patient, obtaining history from patient or surrogate, ordering and performing treatments and interventions, ordering and review of laboratory studies, ordering and review of radiographic studies, pulse oximetry and re-evaluation of patient's condition.    {Click here for ABCD2, HEART and other calculators REFRESH Note before signing:1}                              Medical Decision Making Amount and/or Complexity of Data Reviewed Labs: ordered.  Risk Prescription drug management. Decision regarding hospitalization.   Patient with upper GI bleed.  He will be transfused 1 unit of packed red cells and admitted to medicine with GI consult  {Document critical care time when appropriate  Document review of labs and clinical decision tools ie [id number], etc  Document your independent review of radiology images and any outside records  Document your discussion with family members, caretakers and with consultants  Document social determinants of health affecting pt's care  Document your decision making why or why not admission, treatments were needed:32947:::1}   Final  diagnoses:  UGI bleed    ED Discharge Orders     None

## 2024-01-16 NOTE — ED Notes (Signed)
 Report given to ICU RN

## 2024-01-17 ENCOUNTER — Inpatient Hospital Stay (HOSPITAL_COMMUNITY): Admitting: Anesthesiology

## 2024-01-17 ENCOUNTER — Inpatient Hospital Stay (HOSPITAL_COMMUNITY)

## 2024-01-17 ENCOUNTER — Encounter (HOSPITAL_COMMUNITY)
Admission: EM | Disposition: A | Discharge status: Home / Self Care | Payer: Self-pay | Source: Home / Self Care | Attending: Internal Medicine

## 2024-01-17 ENCOUNTER — Observation Stay (HOSPITAL_COMMUNITY)

## 2024-01-17 ENCOUNTER — Encounter (HOSPITAL_COMMUNITY): Payer: Self-pay | Admitting: Internal Medicine

## 2024-01-17 ENCOUNTER — Observation Stay (HOSPITAL_COMMUNITY): Admitting: Certified Registered Nurse Anesthetist

## 2024-01-17 DIAGNOSIS — Z9889 Other specified postprocedural states: Secondary | ICD-10-CM

## 2024-01-17 DIAGNOSIS — Z87891 Personal history of nicotine dependence: Secondary | ICD-10-CM | POA: Diagnosis not present

## 2024-01-17 DIAGNOSIS — E870 Hyperosmolality and hypernatremia: Secondary | ICD-10-CM | POA: Diagnosis present

## 2024-01-17 DIAGNOSIS — T183XXA Foreign body in small intestine, initial encounter: Secondary | ICD-10-CM | POA: Diagnosis not present

## 2024-01-17 DIAGNOSIS — N179 Acute kidney failure, unspecified: Secondary | ICD-10-CM | POA: Diagnosis present

## 2024-01-17 DIAGNOSIS — C7972 Secondary malignant neoplasm of left adrenal gland: Secondary | ICD-10-CM

## 2024-01-17 DIAGNOSIS — I890 Lymphedema, not elsewhere classified: Secondary | ICD-10-CM

## 2024-01-17 DIAGNOSIS — I82621 Acute embolism and thrombosis of deep veins of right upper extremity: Secondary | ICD-10-CM | POA: Diagnosis not present

## 2024-01-17 DIAGNOSIS — E66813 Obesity, class 3: Secondary | ICD-10-CM | POA: Diagnosis present

## 2024-01-17 DIAGNOSIS — Z7901 Long term (current) use of anticoagulants: Secondary | ICD-10-CM | POA: Diagnosis not present

## 2024-01-17 DIAGNOSIS — D6959 Other secondary thrombocytopenia: Secondary | ICD-10-CM | POA: Diagnosis not present

## 2024-01-17 DIAGNOSIS — J910 Malignant pleural effusion: Secondary | ICD-10-CM | POA: Diagnosis present

## 2024-01-17 DIAGNOSIS — Z8711 Personal history of peptic ulcer disease: Secondary | ICD-10-CM | POA: Diagnosis not present

## 2024-01-17 DIAGNOSIS — C3490 Malignant neoplasm of unspecified part of unspecified bronchus or lung: Secondary | ICD-10-CM | POA: Diagnosis not present

## 2024-01-17 DIAGNOSIS — I82721 Chronic embolism and thrombosis of deep veins of right upper extremity: Secondary | ICD-10-CM | POA: Diagnosis not present

## 2024-01-17 DIAGNOSIS — A411 Sepsis due to other specified staphylococcus: Secondary | ICD-10-CM | POA: Diagnosis not present

## 2024-01-17 DIAGNOSIS — I4891 Unspecified atrial fibrillation: Secondary | ICD-10-CM | POA: Diagnosis not present

## 2024-01-17 DIAGNOSIS — A419 Sepsis, unspecified organism: Secondary | ICD-10-CM | POA: Diagnosis not present

## 2024-01-17 DIAGNOSIS — C3412 Malignant neoplasm of upper lobe, left bronchus or lung: Secondary | ICD-10-CM

## 2024-01-17 DIAGNOSIS — K920 Hematemesis: Secondary | ICD-10-CM

## 2024-01-17 DIAGNOSIS — J9601 Acute respiratory failure with hypoxia: Secondary | ICD-10-CM | POA: Diagnosis not present

## 2024-01-17 DIAGNOSIS — I10 Essential (primary) hypertension: Secondary | ICD-10-CM | POA: Diagnosis present

## 2024-01-17 DIAGNOSIS — G4733 Obstructive sleep apnea (adult) (pediatric): Secondary | ICD-10-CM

## 2024-01-17 DIAGNOSIS — I82629 Acute embolism and thrombosis of deep veins of unspecified upper extremity: Secondary | ICD-10-CM | POA: Diagnosis not present

## 2024-01-17 DIAGNOSIS — K254 Chronic or unspecified gastric ulcer with hemorrhage: Secondary | ICD-10-CM | POA: Diagnosis present

## 2024-01-17 DIAGNOSIS — D62 Acute posthemorrhagic anemia: Secondary | ICD-10-CM | POA: Diagnosis present

## 2024-01-17 DIAGNOSIS — J449 Chronic obstructive pulmonary disease, unspecified: Secondary | ICD-10-CM

## 2024-01-17 DIAGNOSIS — E861 Hypovolemia: Secondary | ICD-10-CM | POA: Diagnosis not present

## 2024-01-17 DIAGNOSIS — D696 Thrombocytopenia, unspecified: Secondary | ICD-10-CM | POA: Diagnosis not present

## 2024-01-17 DIAGNOSIS — E440 Moderate protein-calorie malnutrition: Secondary | ICD-10-CM | POA: Diagnosis present

## 2024-01-17 DIAGNOSIS — R571 Hypovolemic shock: Secondary | ICD-10-CM | POA: Diagnosis not present

## 2024-01-17 DIAGNOSIS — I482 Chronic atrial fibrillation, unspecified: Secondary | ICD-10-CM

## 2024-01-17 DIAGNOSIS — E8809 Other disorders of plasma-protein metabolism, not elsewhere classified: Secondary | ICD-10-CM | POA: Diagnosis present

## 2024-01-17 DIAGNOSIS — I4821 Permanent atrial fibrillation: Secondary | ICD-10-CM | POA: Diagnosis present

## 2024-01-17 DIAGNOSIS — I82611 Acute embolism and thrombosis of superficial veins of right upper extremity: Secondary | ICD-10-CM | POA: Diagnosis not present

## 2024-01-17 DIAGNOSIS — C7971 Secondary malignant neoplasm of right adrenal gland: Secondary | ICD-10-CM | POA: Diagnosis present

## 2024-01-17 DIAGNOSIS — K5732 Diverticulitis of large intestine without perforation or abscess without bleeding: Secondary | ICD-10-CM | POA: Diagnosis present

## 2024-01-17 DIAGNOSIS — R188 Other ascites: Secondary | ICD-10-CM | POA: Diagnosis present

## 2024-01-17 DIAGNOSIS — K264 Chronic or unspecified duodenal ulcer with hemorrhage: Secondary | ICD-10-CM

## 2024-01-17 DIAGNOSIS — K5710 Diverticulosis of small intestine without perforation or abscess without bleeding: Secondary | ICD-10-CM | POA: Diagnosis not present

## 2024-01-17 DIAGNOSIS — E872 Acidosis, unspecified: Secondary | ICD-10-CM | POA: Diagnosis present

## 2024-01-17 DIAGNOSIS — K3189 Other diseases of stomach and duodenum: Secondary | ICD-10-CM | POA: Diagnosis not present

## 2024-01-17 DIAGNOSIS — Z8673 Personal history of transient ischemic attack (TIA), and cerebral infarction without residual deficits: Secondary | ICD-10-CM | POA: Diagnosis not present

## 2024-01-17 DIAGNOSIS — I82A12 Acute embolism and thrombosis of left axillary vein: Secondary | ICD-10-CM | POA: Diagnosis not present

## 2024-01-17 DIAGNOSIS — M7989 Other specified soft tissue disorders: Secondary | ICD-10-CM | POA: Diagnosis not present

## 2024-01-17 DIAGNOSIS — K2970 Gastritis, unspecified, without bleeding: Secondary | ICD-10-CM | POA: Diagnosis not present

## 2024-01-17 DIAGNOSIS — G4730 Sleep apnea, unspecified: Secondary | ICD-10-CM | POA: Diagnosis not present

## 2024-01-17 DIAGNOSIS — Z6841 Body Mass Index (BMI) 40.0 and over, adult: Secondary | ICD-10-CM | POA: Diagnosis not present

## 2024-01-17 DIAGNOSIS — K921 Melena: Secondary | ICD-10-CM | POA: Diagnosis not present

## 2024-01-17 DIAGNOSIS — Z9189 Other specified personal risk factors, not elsewhere classified: Secondary | ICD-10-CM | POA: Diagnosis not present

## 2024-01-17 DIAGNOSIS — K659 Peritonitis, unspecified: Secondary | ICD-10-CM | POA: Diagnosis present

## 2024-01-17 DIAGNOSIS — K7689 Other specified diseases of liver: Secondary | ICD-10-CM | POA: Diagnosis not present

## 2024-01-17 DIAGNOSIS — K922 Gastrointestinal hemorrhage, unspecified: Secondary | ICD-10-CM | POA: Diagnosis present

## 2024-01-17 DIAGNOSIS — R578 Other shock: Secondary | ICD-10-CM | POA: Diagnosis present

## 2024-01-17 HISTORY — PX: ESOPHAGOGASTRODUODENOSCOPY: SHX5428

## 2024-01-17 HISTORY — PX: LAPAROTOMY: SHX154

## 2024-01-17 HISTORY — PX: HEMOSTASIS CLIP PLACEMENT: SHX6857

## 2024-01-17 LAB — CBC
HCT: 22.1 % — ABNORMAL LOW (ref 39.0–52.0)
Hemoglobin: 7 g/dL — ABNORMAL LOW (ref 13.0–17.0)
MCH: 27.5 pg (ref 26.0–34.0)
MCHC: 31.7 g/dL (ref 30.0–36.0)
MCV: 86.7 fL (ref 80.0–100.0)
Platelets: 191 K/uL (ref 150–400)
RBC: 2.55 MIL/uL — ABNORMAL LOW (ref 4.22–5.81)
RDW: 16.4 % — ABNORMAL HIGH (ref 11.5–15.5)
WBC: 4.8 K/uL (ref 4.0–10.5)
nRBC: 0 % (ref 0.0–0.2)

## 2024-01-17 LAB — BASIC METABOLIC PANEL WITH GFR
Anion gap: 10 (ref 5–15)
BUN: 31 mg/dL — ABNORMAL HIGH (ref 8–23)
CO2: 22 mmol/L (ref 22–32)
Calcium: 8.1 mg/dL — ABNORMAL LOW (ref 8.9–10.3)
Chloride: 108 mmol/L (ref 98–111)
Creatinine, Ser: 0.66 mg/dL (ref 0.61–1.24)
GFR, Estimated: >60 mL/min (ref >60)
Glucose, Bld: 87 mg/dL (ref 70–99)
Potassium: 4.1 mmol/L (ref 3.5–5.1)
Sodium: 140 mmol/L (ref 135–145)

## 2024-01-17 LAB — POCT I-STAT 7, (LYTES, BLD GAS, ICA,H+H)
Acid-base deficit: 10 mmol/L — ABNORMAL HIGH (ref 0.0–2.0)
Acid-base deficit: 4 mmol/L — ABNORMAL HIGH (ref 0.0–2.0)
Acid-base deficit: 10 mmol/L — ABNORMAL HIGH (ref 0.0–2.0)
Acid-base deficit: 7 mmol/L — ABNORMAL HIGH (ref 0.0–2.0)
Bicarbonate: 19.5 mmol/L — ABNORMAL LOW (ref 20.0–28.0)
Bicarbonate: 22.3 mmol/L (ref 20.0–28.0)
Bicarbonate: 18.9 mmol/L — ABNORMAL LOW (ref 20.0–28.0)
Bicarbonate: 19.2 mmol/L — ABNORMAL LOW (ref 20.0–28.0)
Calcium, Ion: 0.69 mmol/L — CL (ref 1.15–1.40)
Calcium, Ion: 0.82 mmol/L — CL (ref 1.15–1.40)
Calcium, Ion: 1.15 mmol/L (ref 1.15–1.40)
Calcium, Ion: 1.23 mmol/L (ref 1.15–1.40)
HCT: 29 % — ABNORMAL LOW (ref 39.0–52.0)
HCT: 40 % (ref 39.0–52.0)
HCT: 42 % (ref 39.0–52.0)
HCT: 52 % (ref 39.0–52.0)
Hemoglobin: 13.6 g/dL (ref 13.0–17.0)
Hemoglobin: 9.9 g/dL — ABNORMAL LOW (ref 13.0–17.0)
Hemoglobin: 14.3 g/dL (ref 13.0–17.0)
Hemoglobin: 17.7 g/dL — ABNORMAL HIGH (ref 13.0–17.0)
O2 Saturation: 100 %
O2 Saturation: 99 %
O2 Saturation: 90 %
O2 Saturation: 95 %
Patient temperature: 36
Patient temperature: 34.3
Patient temperature: 35.1
Potassium: 3.6 mmol/L (ref 3.5–5.1)
Potassium: 3.8 mmol/L (ref 3.5–5.1)
Potassium: 3 mmol/L — ABNORMAL LOW (ref 3.5–5.1)
Potassium: 3.3 mmol/L — ABNORMAL LOW (ref 3.5–5.1)
Sodium: 143 mmol/L (ref 135–145)
Sodium: 145 mmol/L (ref 135–145)
Sodium: 146 mmol/L — ABNORMAL HIGH (ref 135–145)
Sodium: 147 mmol/L — ABNORMAL HIGH (ref 135–145)
TCO2: 21 mmol/L — ABNORMAL LOW (ref 22–32)
TCO2: 24 mmol/L (ref 22–32)
TCO2: 20 mmol/L — ABNORMAL LOW (ref 22–32)
TCO2: 20 mmol/L — ABNORMAL LOW (ref 22–32)
pCO2 arterial: 41.9 mmHg (ref 32–48)
pCO2 arterial: 59.7 mmHg — ABNORMAL HIGH (ref 32–48)
pCO2 arterial: 38.5 mmHg (ref 32–48)
pCO2 arterial: 44.5 mmHg (ref 32–48)
pH, Arterial: 7.122 — CL (ref 7.35–7.45)
pH, Arterial: 7.33 — ABNORMAL LOW (ref 7.35–7.45)
pH, Arterial: 7.22 — ABNORMAL LOW (ref 7.35–7.45)
pH, Arterial: 7.296 — ABNORMAL LOW (ref 7.35–7.45)
pO2, Arterial: 192 mmHg — ABNORMAL HIGH (ref 83–108)
pO2, Arterial: 264 mmHg — ABNORMAL HIGH (ref 83–108)
pO2, Arterial: 62 mmHg — ABNORMAL LOW (ref 83–108)
pO2, Arterial: 74 mmHg — ABNORMAL LOW (ref 83–108)

## 2024-01-17 LAB — BLOOD GAS, ARTERIAL
Acid-base deficit: 1.8 mmol/L (ref 0.0–2.0)
Bicarbonate: 23.7 mmol/L (ref 20.0–28.0)
Drawn by: 23430
O2 Saturation: 100 %
Patient temperature: 36.9
pCO2 arterial: 42 mmHg (ref 32–48)
pH, Arterial: 7.36 (ref 7.35–7.45)
pO2, Arterial: 206 mmHg — ABNORMAL HIGH (ref 83–108)

## 2024-01-17 LAB — HEMOGLOBIN AND HEMATOCRIT, BLOOD
HCT: 20.9 % — ABNORMAL LOW (ref 39.0–52.0)
Hemoglobin: 6.8 g/dL — CL (ref 13.0–17.0)

## 2024-01-17 LAB — MRSA NEXT GEN BY PCR, NASAL: MRSA by PCR Next Gen: NOT DETECTED

## 2024-01-17 LAB — GLUCOSE, CAPILLARY
Glucose-Capillary: 173 mg/dL — ABNORMAL HIGH (ref 70–99)
Glucose-Capillary: 162 mg/dL — ABNORMAL HIGH (ref 70–99)

## 2024-01-17 LAB — MASSIVE TRANSFUSION PROTOCOL ORDER (BLOOD BANK NOTIFICATION)

## 2024-01-17 LAB — PREPARE RBC (CROSSMATCH)

## 2024-01-17 MED ORDER — LACTATED RINGERS IV BOLUS: 1000.0000 mL | Freq: Once | INTRAVENOUS | Status: DC

## 2024-01-17 MED ORDER — PROTHROMBIN COMPLEX CONC HUMAN 500 UNITS IV KIT
2215.0000 [IU] | PACK | Status: AC
  Administered 2024-01-17: 17:00:00 2215 [IU] via INTRAVENOUS
  Filled 2024-01-17: qty 2215

## 2024-01-17 MED ORDER — MIDAZOLAM-SODIUM CHLORIDE 100-0.9 MG/100ML-% IV SOLN
0.5000 mg/h | INTRAVENOUS | Status: DC
  Administered 2024-01-17: 12:00:00 0.5 mg/h via INTRAVENOUS
  Filled 2024-01-17: qty 100

## 2024-01-17 MED ORDER — SODIUM CHLORIDE 0.9 % IV BOLUS
1000.0000 mL | Freq: Once | INTRAVENOUS | Status: AC
  Administered 2024-01-17: 12:00:00 1000 mL via INTRAVENOUS

## 2024-01-17 MED ORDER — ROCURONIUM BROMIDE 10 MG/ML (PF) SYRINGE
PREFILLED_SYRINGE | INTRAVENOUS | Status: AC
  Filled 2024-01-17: qty 10

## 2024-01-17 MED ORDER — COAGULATION FACTOR VIIA RECOMB 1 MG IV SOLR
90.0000 ug/kg | Freq: Once | INTRAVENOUS | Status: AC
  Administered 2024-01-17: 18:00:00 11000 ug via INTRAVENOUS
  Filled 2024-01-17: qty 11

## 2024-01-17 MED ORDER — LOPERAMIDE HCL 1 MG/7.5ML PO SUSP: 2.0000 mg | ORAL | Status: DC | PRN

## 2024-01-17 MED ORDER — NOREPINEPHRINE 16 MG/250ML-% IV SOLN: 0.0000 ug/min | INTRAVENOUS | Status: DC

## 2024-01-17 MED ORDER — LACTATED RINGERS IV SOLN: INTRAVENOUS | Status: DC | PRN

## 2024-01-17 MED ORDER — FENTANYL BOLUS VIA INFUSION
50.0000 ug | INTRAVENOUS | Status: DC | PRN
  Administered 2024-01-17: 14:00:00 50 ug via INTRAVENOUS

## 2024-01-17 MED ORDER — HEMOSTATIC AGENTS (NO CHARGE) OPTIME
TOPICAL | Status: DC | PRN
  Administered 2024-01-17 (×2): 1 via TOPICAL

## 2024-01-17 MED ORDER — FENTANYL 2500MCG IN NS 250ML (10MCG/ML) PREMIX INFUSION
0.0000 ug/h | INTRAVENOUS | Status: DC
  Administered 2024-01-17: 19:00:00 100 ug/h via INTRAVENOUS
  Administered 2024-01-18 – 2024-01-20 (×5): 200 ug/h via INTRAVENOUS
  Administered 2024-01-20: 150 ug/h via INTRAVENOUS
  Administered 2024-01-22: 25 ug/h via INTRAVENOUS
  Administered 2024-01-23: 100 ug/h via INTRAVENOUS
  Filled 2024-01-17 (×8): qty 250

## 2024-01-17 MED ORDER — PANTOPRAZOLE SODIUM 40 MG IV SOLR: 40.0000 mg | Freq: Two times a day (BID) | INTRAVENOUS | Status: DC

## 2024-01-17 MED ORDER — VITAMIN D 25 MCG (1000 UNIT) PO TABS: 1000.0000 [IU] | ORAL_TABLET | Freq: Every day | ORAL | Status: DC

## 2024-01-17 MED ORDER — PIPERACILLIN-TAZOBACTAM 3.375 G IVPB
3.3750 g | Freq: Three times a day (TID) | INTRAVENOUS | Status: DC
  Administered 2024-01-17 – 2024-01-30 (×38): 3.375 g via INTRAVENOUS
  Filled 2024-01-17 (×35): qty 50

## 2024-01-17 MED ORDER — SODIUM CHLORIDE 0.9 % IV SOLN: INTRAVENOUS | Status: DC | PRN

## 2024-01-17 MED ORDER — LOPERAMIDE HCL 2 MG PO TABS: 2.0000 mg | ORAL_TABLET | Freq: Four times a day (QID) | ORAL | Status: DC | PRN

## 2024-01-17 MED ORDER — CALCIUM GLUCONATE-NACL 1-0.675 GM/50ML-% IV SOLN
INTRAVENOUS | Status: AC
  Filled 2024-01-17: qty 100

## 2024-01-17 MED ORDER — FLUCONAZOLE IN SODIUM CHLORIDE 400-0.9 MG/200ML-% IV SOLN
400.0000 mg | INTRAVENOUS | Status: DC
  Administered 2024-01-17 – 2024-01-28 (×12): 400 mg via INTRAVENOUS
  Filled 2024-01-17 (×12): qty 200

## 2024-01-17 MED ORDER — VITAMIN B-12 100 MCG PO TABS
100.0000 ug | ORAL_TABLET | Freq: Every day | ORAL | Status: DC
  Filled 2024-01-17: qty 1

## 2024-01-17 MED ORDER — PHENYLEPHRINE 80 MCG/ML (10ML) SYRINGE FOR IV PUSH (FOR BLOOD PRESSURE SUPPORT)
PREFILLED_SYRINGE | INTRAVENOUS | Status: DC | PRN
  Administered 2024-01-17 (×6): 160 ug via INTRAVENOUS

## 2024-01-17 MED ORDER — HEPARIN SOD (PORK) LOCK FLUSH 100 UNIT/ML IV SOLN
500.0000 [IU] | Freq: Once | INTRAVENOUS | Status: DC
  Filled 2024-01-17 (×3): qty 5

## 2024-01-17 MED ORDER — SODIUM BICARBONATE 8.4 % IV SOLN
50.0000 meq | Freq: Once | INTRAVENOUS | Status: AC
  Administered 2024-01-17: 17:00:00 50 meq via INTRAVENOUS

## 2024-01-17 MED ORDER — SODIUM BICARBONATE 8.4 % IV SOLN
INTRAVENOUS | Status: AC
  Filled 2024-01-17: qty 100

## 2024-01-17 MED ORDER — CALCIUM GLUCONATE-NACL 2-0.675 GM/100ML-% IV SOLN
2.0000 g | Freq: Once | INTRAVENOUS | Status: DC
  Filled 2024-01-17: qty 100

## 2024-01-17 MED ORDER — ROCURONIUM BROMIDE 10 MG/ML (PF) SYRINGE
PREFILLED_SYRINGE | INTRAVENOUS | Status: DC | PRN
  Administered 2024-01-17: 11:00:00 40 mg via INTRAVENOUS

## 2024-01-17 MED ORDER — PROPOFOL 10 MG/ML IV BOLUS
INTRAVENOUS | Status: AC
  Filled 2024-01-17: qty 20

## 2024-01-17 MED ORDER — EPINEPHRINE HCL 5 MG/250ML IV SOLN IN NS
INTRAVENOUS | Status: DC | PRN
  Administered 2024-01-17: 19:00:00 15 ug/min via INTRAVENOUS

## 2024-01-17 MED ORDER — ALLOPURINOL 100 MG PO TABS: 100.0000 mg | ORAL_TABLET | Freq: Every day | ORAL | Status: DC

## 2024-01-17 MED ORDER — FENTANYL CITRATE (PF) 50 MCG/ML IJ SOSY: 25.0000 ug | PREFILLED_SYRINGE | Freq: Once | INTRAMUSCULAR | Status: DC

## 2024-01-17 MED ORDER — MIDAZOLAM BOLUS VIA INFUSION
0.0000 mg | INTRAVENOUS | Status: DC | PRN
  Administered 2024-01-17: 17:00:00 4 mg via INTRAVENOUS
  Administered 2024-01-17 – 2024-01-20 (×5): 2 mg via INTRAVENOUS
  Administered 2024-01-21: 4 mg via INTRAVENOUS
  Administered 2024-01-21: 2 mg via INTRAVENOUS

## 2024-01-17 MED ORDER — PANTOPRAZOLE SODIUM 40 MG IV SOLR
40.0000 mg | INTRAVENOUS | Status: AC
  Administered 2024-01-17 (×2): 40 mg via INTRAVENOUS

## 2024-01-17 MED ORDER — LIDOCAINE 2% (20 MG/ML) 5 ML SYRINGE
INTRAMUSCULAR | Status: AC
  Filled 2024-01-17: qty 5

## 2024-01-17 MED ORDER — PANTOPRAZOLE SODIUM 40 MG PO TBEC: 40.0000 mg | DELAYED_RELEASE_TABLET | Freq: Two times a day (BID) | ORAL | Status: DC

## 2024-01-17 MED ORDER — FENTANYL 2500MCG IN NS 250ML (10MCG/ML) PREMIX INFUSION
0.0000 ug/h | INTRAVENOUS | Status: DC
  Administered 2024-01-17: 12:00:00 50 ug/h via INTRAVENOUS
  Filled 2024-01-17: qty 250

## 2024-01-17 MED ORDER — PANTOPRAZOLE SODIUM 40 MG IV SOLR
40.0000 mg | Freq: Two times a day (BID) | INTRAVENOUS | Status: DC
  Administered 2024-01-20 – 2024-01-31 (×23): 40 mg via INTRAVENOUS
  Filled 2024-01-17 (×17): qty 10

## 2024-01-17 MED ORDER — SODIUM CHLORIDE 0.9 % IV SOLN: INTRAVENOUS | Status: DC

## 2024-01-17 MED ORDER — SODIUM CHLORIDE 0.9% IV SOLUTION: Freq: Once | INTRAVENOUS | Status: AC

## 2024-01-17 MED ORDER — SUCCINYLCHOLINE CHLORIDE 200 MG/10ML IV SOSY
PREFILLED_SYRINGE | INTRAVENOUS | Status: DC | PRN
  Administered 2024-01-17: 10:00:00 120 mg via INTRAVENOUS

## 2024-01-17 MED ORDER — MIDAZOLAM-SODIUM CHLORIDE 100-0.9 MG/100ML-% IV SOLN
0.0000 mg/h | INTRAVENOUS | Status: DC
  Administered 2024-01-17: 19:00:00 5 mg/h via INTRAVENOUS
  Administered 2024-01-18 – 2024-01-19 (×2): 2 mg/h via INTRAVENOUS
  Filled 2024-01-17 (×2): qty 100

## 2024-01-17 MED ORDER — CALCIUM CHLORIDE 10 % IV SOLN
1.0000 g | Freq: Once | INTRAVENOUS | Status: AC
  Administered 2024-01-17: 17:00:00 1 g via INTRAVENOUS

## 2024-01-17 MED ORDER — SODIUM BICARBONATE 8.4 % IV SOLN
50.0000 meq | Freq: Once | INTRAVENOUS | Status: AC
  Administered 2024-01-17: 18:00:00 50 meq via INTRAVENOUS

## 2024-01-17 MED ORDER — METHYLPREDNISOLONE SODIUM SUCC 125 MG IJ SOLR
125.0000 mg | Freq: Once | INTRAMUSCULAR | Status: AC
  Administered 2024-01-17: 21:00:00 125 mg via INTRAVENOUS
  Filled 2024-01-17: qty 2

## 2024-01-17 MED ORDER — INSULIN ASPART 100 UNIT/ML IJ SOLN
0.0000 [IU] | INTRAMUSCULAR | Status: DC
  Administered 2024-01-17: 2 [IU] via SUBCUTANEOUS
  Administered 2024-01-18: 08:00:00 1 [IU] via SUBCUTANEOUS
  Administered 2024-01-18: 04:00:00 2 [IU] via SUBCUTANEOUS
  Administered 2024-01-18 – 2024-01-21 (×6): 1 [IU] via SUBCUTANEOUS
  Administered 2024-01-22: 2 [IU] via SUBCUTANEOUS
  Administered 2024-01-22: 1 [IU] via SUBCUTANEOUS
  Administered 2024-01-22: 2 [IU] via SUBCUTANEOUS
  Administered 2024-01-22: 1 [IU] via SUBCUTANEOUS
  Administered 2024-01-23 (×3): 2 [IU] via SUBCUTANEOUS
  Filled 2024-01-17 (×2): qty 2
  Filled 2024-01-17 (×2): qty 1
  Filled 2024-01-17: qty 2
  Filled 2024-01-17: qty 1
  Filled 2024-01-17 (×2): qty 2
  Filled 2024-01-17 (×3): qty 1
  Filled 2024-01-17 (×2): qty 2
  Filled 2024-01-17 (×2): qty 1

## 2024-01-17 MED ORDER — HYDROCORTISONE SOD SUC (PF) 100 MG IJ SOLR
100.0000 mg | Freq: Two times a day (BID) | INTRAMUSCULAR | Status: DC
  Administered 2024-01-17 – 2024-01-18 (×2): 100 mg via INTRAVENOUS
  Filled 2024-01-17 (×2): qty 2

## 2024-01-17 MED ORDER — PANTOPRAZOLE SODIUM 40 MG IV SOLR
40.0000 mg | INTRAVENOUS | Status: DC
  Filled 2024-01-17 (×2): qty 10

## 2024-01-17 MED ORDER — DEXAMETHASONE SOD PHOSPHATE PF 10 MG/ML IJ SOLN
INTRAMUSCULAR | Status: DC | PRN
  Administered 2024-01-17: 20:00:00 10 mg via INTRAVENOUS

## 2024-01-17 MED ORDER — SODIUM BICARBONATE 8.4 % IV SOLN
INTRAVENOUS | Status: DC | PRN
  Administered 2024-01-17 (×3): 50 meq via INTRAVENOUS

## 2024-01-17 MED ORDER — METOPROLOL TARTRATE 25 MG PO TABS: 25.0000 mg | ORAL_TABLET | Freq: Two times a day (BID) | ORAL | Status: DC

## 2024-01-17 MED ORDER — PANTOPRAZOLE SODIUM 40 MG IV SOLR: 40.0000 mg | Freq: Four times a day (QID) | INTRAVENOUS | Status: DC

## 2024-01-17 MED ORDER — LACTATED RINGERS IV BOLUS
1000.0000 mL | Freq: Once | INTRAVENOUS | Status: AC
  Administered 2024-01-17: 15:00:00 1000 mL via INTRAVENOUS

## 2024-01-17 MED ORDER — ACETAMINOPHEN 500 MG PO TABS: 500.0000 mg | ORAL_TABLET | Freq: Four times a day (QID) | ORAL | Status: DC | PRN

## 2024-01-17 MED ORDER — DEXTROSE 5 % IV SOLN
INTRAVENOUS | Status: DC | PRN
  Administered 2024-01-17: 19:00:00 3 g via INTRAVENOUS

## 2024-01-17 MED ORDER — SODIUM CHLORIDE 0.9% IV SOLUTION: Freq: Once | INTRAVENOUS | Status: DC

## 2024-01-17 MED ORDER — 0.9 % SODIUM CHLORIDE (POUR BTL) OPTIME
TOPICAL | Status: DC | PRN
  Administered 2024-01-17: 19:00:00 2000 mL

## 2024-01-17 MED ORDER — LIDOCAINE 2% (20 MG/ML) 5 ML SYRINGE
INTRAMUSCULAR | Status: DC | PRN
  Administered 2024-01-17: 10:00:00 60 mg via INTRAVENOUS

## 2024-01-17 MED ORDER — DIPHENHYDRAMINE HCL 50 MG/ML IJ SOLN
50.0000 mg | Freq: Once | INTRAMUSCULAR | Status: AC
  Administered 2024-01-17: 18:00:00 50 mg via INTRAMUSCULAR

## 2024-01-17 MED ORDER — PANTOPRAZOLE SODIUM 40 MG IV SOLR
40.0000 mg | Freq: Four times a day (QID) | INTRAVENOUS | Status: AC
  Administered 2024-01-18 – 2024-01-20 (×11): 40 mg via INTRAVENOUS
  Filled 2024-01-17 (×11): qty 10

## 2024-01-17 MED ORDER — NOREPINEPHRINE 4 MG/250ML-% IV SOLN: 0.0000 ug/min | INTRAVENOUS | Status: DC

## 2024-01-17 MED ORDER — FUROSEMIDE 20 MG PO TABS
10.0000 mg | ORAL_TABLET | Freq: Every day | ORAL | Status: DC
  Filled 2024-01-17: qty 0.5

## 2024-01-17 MED ORDER — HEPARIN SODIUM (PORCINE) 1000 UNIT/ML IJ SOLN
INTRAMUSCULAR | Status: AC
  Filled 2024-01-17: qty 2

## 2024-01-17 MED ORDER — CEFAZOLIN SODIUM-DEXTROSE 1-4 GM/50ML-% IV SOLN: INTRAVENOUS | Status: DC | PRN

## 2024-01-17 MED ORDER — TRANEXAMIC ACID-NACL 1000-0.7 MG/100ML-% IV SOLN
1000.0000 mg | Freq: Once | INTRAVENOUS | Status: AC
  Administered 2024-01-17: 18:00:00 1000 mg via INTRAVENOUS

## 2024-01-17 MED ORDER — SODIUM CHLORIDE 0.9 % IV SOLN: 250.0000 mL | INTRAVENOUS | Status: AC

## 2024-01-17 MED ORDER — SUCCINYLCHOLINE CHLORIDE 200 MG/10ML IV SOSY
PREFILLED_SYRINGE | INTRAVENOUS | Status: AC
  Filled 2024-01-17: qty 10

## 2024-01-17 MED ORDER — FENTANYL BOLUS VIA INFUSION
25.0000 ug | INTRAVENOUS | Status: DC | PRN
  Administered 2024-01-17 – 2024-01-19 (×5): 100 ug via INTRAVENOUS
  Administered 2024-01-20: 75 ug via INTRAVENOUS
  Administered 2024-01-20: 100 ug via INTRAVENOUS
  Administered 2024-01-20: 25 ug via INTRAVENOUS
  Administered 2024-01-20: 75 ug via INTRAVENOUS
  Administered 2024-01-20 – 2024-01-23 (×19): 100 ug via INTRAVENOUS

## 2024-01-17 MED ORDER — NOREPINEPHRINE 4 MG/250ML-% IV SOLN
0.0000 ug/min | INTRAVENOUS | Status: DC
  Administered 2024-01-17: 14:00:00 5 ug/min via INTRAVENOUS
  Filled 2024-01-17 (×2): qty 250

## 2024-01-17 MED ORDER — PROPOFOL 10 MG/ML IV BOLUS
INTRAVENOUS | Status: DC | PRN
  Administered 2024-01-17: 11:00:00 25 ug/kg/min via INTRAVENOUS
  Administered 2024-01-17: 10:00:00 50 mg via INTRAVENOUS
  Administered 2024-01-17: 10:00:00 100 mg via INTRAVENOUS

## 2024-01-17 MED ORDER — THIAMINE MONONITRATE 100 MG PO TABS: 100.0000 mg | ORAL_TABLET | Freq: Every day | ORAL | Status: DC

## 2024-01-17 MED ORDER — PROPOFOL 1000 MG/100ML IV EMUL: 0.0000 ug/kg/min | INTRAVENOUS | Status: DC

## 2024-01-17 MED ORDER — FUROSEMIDE 10 MG/ML IJ SOLN: 20.0000 mg | Freq: Once | INTRAMUSCULAR | Status: DC

## 2024-01-17 MED ORDER — PROTHROMBIN COMPLEX CONC HUMAN 500 UNITS IV KIT
2100.0000 [IU] | PACK | Status: DC
  Filled 2024-01-17: qty 2100

## 2024-01-17 MED ORDER — FOLIC ACID 1 MG PO TABS: 1.0000 mg | ORAL_TABLET | Freq: Every day | ORAL | Status: DC

## 2024-01-17 MED ORDER — PROPOFOL 1000 MG/100ML IV EMUL
INTRAVENOUS | Status: AC
  Filled 2024-01-17: qty 100

## 2024-01-17 MED ORDER — CHLORHEXIDINE GLUCONATE CLOTH 2 % EX PADS
6.0000 | MEDICATED_PAD | Freq: Every day | CUTANEOUS | Status: DC
  Administered 2024-01-18 – 2024-02-21 (×35): 6 via TOPICAL

## 2024-01-17 MED ADMIN — Rocuronium Bromide IV Soln Pref Syr 100 MG/10ML (10 MG/ML): 100 mg | INTRAVENOUS | @ 20:00:00 | NDC 99999070048

## 2024-01-17 MED ADMIN — Rocuronium Bromide IV Soln Pref Syr 100 MG/10ML (10 MG/ML): 100 mg | INTRAVENOUS | @ 19:00:00 | NDC 99999070048

## 2024-01-17 MED FILL — Rocuronium Bromide IV Soln Pref Syr 100 MG/10ML (10 MG/ML): INTRAVENOUS | Qty: 10 | Status: AC

## 2024-01-17 MED FILL — Phenylephrine-NaCl Pref Syr 0.8 MG/10ML-0.9% (80 MCG/ML): INTRAVENOUS | Qty: 20 | Status: AC

## 2024-01-17 MED FILL — Propofol IV Emul 200 MG/20ML (10 MG/ML): INTRAVENOUS | Qty: 20 | Status: AC

## 2024-01-17 MED FILL — Lidocaine HCl Local Soln Prefilled Syringe 100 MG/5ML (2%): INTRAMUSCULAR | Qty: 5 | Status: AC

## 2024-01-17 MED FILL — Succinylcholine Chloride Sol Pref Syr 200 MG/10ML (20 MG/ML): INTRAVENOUS | Qty: 10 | Status: AC

## 2024-01-17 NOTE — Consult Note (Signed)
 [facility] Consult  Reason for Consult: Duodenal bulb bleeding? Coil erosion?  Referring Physician:  Dr. Cindie   Chief Complaint   Hematemesis     HPI: Raymond Hurst is a 73 y.o. male with COPD, CVA, Stage IV Lung cancer (poor differentiated with metastasis to his adrenal gland s/p chemo and radiation), A fib on Eliquis  s/p reversal who had a history of GI bleed in August from a duodenal ulcer that Dr. Shaaron noted on EGD and then he was sent for IR coil which was placed. He then represented in December with melena and scope 12/8 demonstrated an area of possible granulation/ protrusion near the area of the prior ulcer. He was sent home and returned with more melena a repeat scope today shows some bleeding of this protrusion area and Dr. Cindie sprayed the area and is worried he may need further intervention with IR for angiography to see if this protrusion was bleeding or if another area is bleeding or if this patient potentially has a coil erosion.  He is not intubated after this endoscopy and has received 3U pRBC.  09/2023 EGD photos:  01/08/2024 EGD Photos ?protrusion? : Report: Normal esophagus. Gastritis. Biopsied.   - Non-bleeding duodenal ulcer with a clean ulcer base (Forrest Class III).  01/17/2024 EGD photos bleeding area in bulb ? Erosion of coil? :   Past Medical History:  Diagnosis Date   Adrenal tumor    right adrenal gland tumor favored to be a myelolipoma   Atrial fibrillation (HCC)    Cancer (HCC)    stage 4 Lung   Cellulitis of right lower leg    COPD (chronic obstructive pulmonary disease) (HCC)    Dysrhythmia    A-fib   GI bleed    History of TIA (transient ischemic attack)    May 2020   Lymphedema    OSA on CPAP    severe obstructive sleep apnea with an AHI of 54/h with no significant central events.  He had nocturnal hypoxemia with O2 sat nadir of 70%. Now on CPAP   Pulmonary mass    Stroke [facility])     Past Surgical History:   Procedure Laterality Date   BRONCHIAL BIOPSY  06/12/2023   Procedure: BRONCHOSCOPY, WITH BIOPSY;  Surgeon: Shelah Lamar RAMAN, MD;  Location: [facility] ENDOSCOPY;  Service: Cardiopulmonary;;   BRONCHIAL BRUSHINGS  06/12/2023   Procedure: BRONCHOSCOPY, WITH BRUSH BIOPSY;  Surgeon: Shelah Lamar RAMAN, MD;  Location: [facility] ENDOSCOPY;  Service: Cardiopulmonary;;   BRONCHIAL NEEDLE ASPIRATION BIOPSY  06/12/2023   Procedure: BRONCHOSCOPY, WITH NEEDLE ASPIRATION BIOPSY;  Surgeon: Shelah Lamar RAMAN, MD;  Location: [facility] ENDOSCOPY;  Service: Cardiopulmonary;;   BRONCHIAL NEEDLE ASPIRATION BIOPSY  08/28/2023   Procedure: BRONCHOSCOPY, WITH NEEDLE ASPIRATION BIOPSY;  Surgeon: Shelah Lamar RAMAN, MD;  [facility] ENDOSCOPY;  Service: Pulmonary;;   CRYOTHERAPY  08/28/2023   Procedure: CRYOTHERAPY;  Surgeon: Shelah Lamar RAMAN, MD;  Location: [facility] ENDOSCOPY;  Service: Pulmonary;;   ESOPHAGOGASTRODUODENOSCOPY N/A 09/09/2023   Procedure: EGD (ESOPHAGOGASTRODUODENOSCOPY);  Surgeon: Shaaron Lamar HERO, MD;  Location: [facility] ENDO SUITE;  Service: Endoscopy;  Laterality: N/A;   ESOPHAGOGASTRODUODENOSCOPY N/A 01/08/2024   Procedure: EGD (ESOPHAGOGASTRODUODENOSCOPY);  Surgeon: Cindie Carlin POUR, DO;  Location: [facility] ENDO SUITE;  Service: Endoscopy;  Laterality: N/A;   HEMOSTASIS CLIP PLACEMENT  06/12/2023   Procedure: CONTROL OF HEMORRHAGE bronch cold saline and epi;  Surgeon: Shelah Lamar RAMAN, MD;  Location: [facility] ENDOSCOPY;  Service: Cardiopulmonary;;   INNER EAR SURGERY  IR ANGIOGRAM SELECTIVE EACH ADDITIONAL VESSEL  09/09/2023   IR ANGIOGRAM VISCERAL SELECTIVE  09/09/2023   IR ANGIOGRAM VISCERAL SELECTIVE  09/09/2023   IR EMBO ART  VEN HEMORR LYMPH EXTRAV  INC GUIDE ROADMAPPING  09/09/2023   IR THORACENTESIS ASP PLEURAL SPACE W/IMG GUIDE  09/26/2023   IR US  GUIDE VASC ACCESS RIGHT  09/09/2023   KNEE SURGERY Left    THORACENTESIS Left 06/06/2023   Procedure: THORACENTESIS;  Surgeon: Arlinda Ranks, MD;  Location: [facility] ENDOSCOPY;  Service: Pulmonary;  Laterality:  Left;   TONSILLECTOMY     VIDEO BRONCHOSCOPY WITH ENDOBRONCHIAL NAVIGATION Left 08/28/2023   Procedure: VIDEO BRONCHOSCOPY WITH ENDOBRONCHIAL NAVIGATION;  Surgeon: Shelah Lamar RAMAN, MD;  Location: [facility] ENDOSCOPY;  Service: Pulmonary;  Laterality: Left;   VIDEO BRONCHOSCOPY WITH ENDOBRONCHIAL ULTRASOUND Left 06/12/2023   Procedure: BRONCHOSCOPY, WITH EBUS;  Surgeon: Shelah Lamar RAMAN, MD;  Location: [facility] ENDOSCOPY;  Service: Cardiopulmonary;  Laterality: Left;   VIDEO BRONCHOSCOPY WITH ENDOBRONCHIAL ULTRASOUND  08/28/2023   Procedure: BRONCHOSCOPY, WITH EBUS;  Surgeon: Shelah Lamar RAMAN, MD;  Location: [facility] ENDOSCOPY;  Service: Pulmonary;;    Family History  Problem Relation Age of Onset   Heart failure Mother    COPD Mother    Atrial fibrillation Mother    Idiopathic pulmonary fibrosis Father     Social History[1]  Medications: I have reviewed the patient's current medications. Prior to Admission:  Medications Prior to Admission  Medication Sig Dispense Refill Last Dose/Taking   acetaminophen  (TYLENOL ) 500 MG tablet Take 500 mg by mouth every 6 (six) hours as needed for mild pain (pain score 1-3).   Unknown   allopurinol  (ZYLOPRIM ) 100 MG tablet Take 100 mg by mouth daily.   Past Week   apixaban  (ELIQUIS ) 5 MG TABS tablet Take 1 tablet (5 mg total) by mouth 2 (two) times daily. Okay to restart this medication on 08/29/2023   Past Week   budesonide -glycopyrrolate -formoterol  (BREZTRI  AEROSPHERE) 160-9-4.8 MCG/ACT AERO inhaler Inhale 2 puffs into the lungs in the morning and at bedtime. 3 each 4 Past Week   cholecalciferol  (VITAMIN D3) 25 MCG (1000 UNIT) tablet Take 1,000 Units by mouth daily.   Past Week   cyanocobalamin  (VITAMIN B12) 100 MCG tablet Take 100 mcg by mouth daily.   Past Week   folic acid  (FOLVITE ) 1 MG tablet TAKE 1 TABLET BY MOUTH EVERY DAY 90 tablet 3 Past Week   furosemide  (LASIX ) 20 MG tablet Take 10 mg by mouth daily.   Past Week   ipratropium-albuterol  (DUONEB) 0.5-2.5 (3) MG/3ML  SOLN Take 3 mLs by nebulization every 6 (six) hours as needed. 360 mL 6 Unknown   loperamide  (IMODIUM  A-D) 2 MG tablet Take 2-4 mg by mouth 4 (four) times daily as needed for diarrhea or loose stools.   Unknown   metoprolol  tartrate (LOPRESSOR ) 25 MG tablet Take 1 tablet (25 mg total) by mouth 2 (two) times daily. 180 tablet 3 Past Week   pantoprazole  (PROTONIX ) 40 MG tablet Take 1 tablet (40 mg total) by mouth 2 (two) times daily before a meal. 60 tablet 2 Past Week   thiamine  (VITAMIN B-1) 100 MG tablet Take 100 mg by mouth daily.   Past Week   Scheduled:  sodium chloride    Intravenous Once   sodium chloride    Intravenous Once   Chlorhexidine  Gluconate Cloth  6 each Topical Daily   furosemide   20 mg Intravenous Once   furosemide   20 mg Intravenous Once   pantoprazole  (PROTONIX ) IV  40 mg Intravenous Q6H   Followed by   NOREEN ON 01/20/2024] pantoprazole  (PROTONIX ) IV  40 mg Intravenous Q12H   Continuous:  sodium chloride      fentaNYL  infusion INTRAVENOUS 100 mcg/hr (01/17/24 1330)   midazolam  1.5 mg/hr (01/17/24 1330)   norepinephrine  (LEVOPHED ) Adult infusion     EMW:pemjumneplf-$MzfnczAzqnmzIZPI_tpHrADWGXhUFkfRepyOviNjuZEiqhLYg$$MzfnczAzqnmzIZPI_tpHrADWGXhUFkfRepyOviNjuZEiqhLYg$ , ondansetron  (ZOFRAN ) IV, mouth rinse  Allergies[2]   ROS:  A comprehensive review of systems was negative except for: Gastrointestinal: positive for GIB, ? Bleeding area in duodenum  Blood pressure 93/69, pulse 91, temperature 98.4 F (36.9 C), temperature source Oral, resp. rate 18, height 6' (1.829 m), weight 119.7 kg, SpO2 100%. Physical Exam HENT:     Head: Normocephalic.     Comments: Intubated, follows commands, opens eyes     Nose: Nose normal.  Eyes:     Extraocular Movements: Extraocular movements intact.  Cardiovascular:     Rate and Rhythm: Normal rate.  Pulmonary:     Effort: Pulmonary effort is normal.  Abdominal:     General: There is no distension.     Palpations: Abdomen is soft.     Tenderness: There is no abdominal tenderness.  Skin:    General: Skin is warm.   Neurological:     Mental Status: He is alert.     Comments: Intubated      Results: Results for orders placed or performed during the hospital encounter of 01/16/24 (from the past 48 hours)  CBC with Differential     Status: Abnormal   Collection Time: 01/16/24  4:23 PM  Result Value Ref Range   WBC 6.8 4.0 - 10.5 K/uL   RBC 2.91 (L) 4.22 - 5.81 MIL/uL   Hemoglobin 7.7 (L) 13.0 - 17.0 g/dL   HCT 74.5 (L) 60.9 - 47.9 %   MCV 87.3 80.0 - 100.0 fL   MCH 26.5 26.0 - 34.0 pg   MCHC 30.3 30.0 - 36.0 g/dL   RDW 83.2 (H) 88.4 - 84.4 %   Platelets 257 150 - 400 K/uL   nRBC 0.0 0.0 - 0.2 %   Neutrophils Relative % 80 %   Neutro Abs 5.4 1.7 - 7.7 K/uL   Lymphocytes Relative 10 %   Lymphs Abs 0.7 0.7 - 4.0 K/uL   Monocytes Relative 7 %   Monocytes Absolute 0.5 0.1 - 1.0 K/uL   Eosinophils Relative 2 %   Eosinophils Absolute 0.1 0.0 - 0.5 K/uL   Basophils Relative 1 %   Basophils Absolute 0.1 0.0 - 0.1 K/uL   Immature Granulocytes 0 %   Abs Immature Granulocytes 0.03 0.00 - 0.07 K/uL    Comment: Performed at [facility], [address] [place], [place] [address]  Comprehensive metabolic panel     Status: Abnormal   Collection Time: 01/16/24  4:23 PM  Result Value Ref Range   Sodium 137 135 - 145 mmol/L   Potassium 3.6 3.5 - 5.1 mmol/L   Chloride 103 98 - 111 mmol/L   CO2 19 (L) 22 - 32 mmol/L   Glucose, Bld 178 (H) 70 - 99 mg/dL    Comment: Glucose reference range applies only to samples taken after fasting for at least 8 hours.   BUN 28 (H) 8 - 23 mg/dL   Creatinine, Ser 9.20 0.61 - 1.24 mg/dL   Calcium  8.4 (L) 8.9 - 10.3 mg/dL   Total Protein 5.7 (L) 6.5 - 8.1 g/dL   Albumin 3.5 3.5 - 5.0 g/dL   AST 13 (L) 15 -  41 U/L   ALT 6 0 - 44 U/L   Alkaline Phosphatase 49 38 - 126 U/L   Total Bilirubin 0.6 0.0 - 1.2 mg/dL   GFR, Estimated >39 >39 mL/min    Comment: (NOTE) Calculated using the CKD-EPI Creatinine Equation (2021)    Anion gap 15 5 - 15    Comment: Performed at  [facility], [address] [place], [place] [address]  Type and screen     Status: None (Preliminary result)   Collection Time: 01/16/24  4:35 PM  Result Value Ref Range   ABO/RH(D) O NEG    Antibody Screen NEG    Sample Expiration 01/19/2024,2359    Unit Number [id number]    Blood Component Type RED CELLS,LR    Unit division 00    Status of Unit ISSUED    Transfusion Status OK TO TRANSFUSE    Crossmatch Result Compatible    Unit Number [id number]    Blood Component Type RED CELLS,LR    Unit division 00    Status of Unit ISSUED,FINAL    Transfusion Status OK TO TRANSFUSE    Crossmatch Result Compatible    Unit Number [id number]    Blood Component Type RED CELLS,LR    Unit division 00    Status of Unit ISSUED    Transfusion Status OK TO TRANSFUSE    Crossmatch Result Compatible    Unit Number [id number]    Blood Component Type RED CELLS,LR    Unit division 00    Status of Unit ALLOCATED    Transfusion Status OK TO TRANSFUSE    Crossmatch Result      Compatible Performed at [facility], [address], [place], [place] [address]    Unit Number [id number]    Blood Component Type RED CELLS,LR    Unit division 00    Status of Unit ALLOCATED    Transfusion Status OK TO TRANSFUSE    Crossmatch Result Compatible   Prepare RBC (crossmatch)     Status: None   Collection Time: 01/16/24  4:35 PM  Result Value Ref Range   Order Confirmation      ORDER PROCESSED BY BLOOD BANK Performed at [facility], [address], [place], [place] [address]   Prepare RBC (crossmatch)     Status: None   Collection Time: 01/16/24  5:04 PM  Result Value Ref Range   Order Confirmation      ORDER PROCESSED BY BLOOD BANK Performed at [facility], [address], [place], [place] [address]   I-stat chem 8, ED (not at [facility], [facility] or [facility])     Status: Abnormal   Collection Time: 01/16/24  5:08 PM  Result Value Ref Range   Sodium 137 135 - 145 mmol/L   Potassium 3.6 3.5 - 5.1 mmol/L    Chloride 104 98 - 111 mmol/L   BUN 27 (H) 8 - 23 mg/dL   Creatinine, Ser 9.09 0.61 - 1.24 mg/dL   Glucose, Bld 827 (H) 70 - 99 mg/dL    Comment: Glucose reference range applies only to samples taken after fasting for at least 8 hours.   Calcium , Ion 1.07 (L) 1.15 - 1.40 mmol/L   TCO2 20 (L) 22 - 32 mmol/L   Hemoglobin 8.2 (L) 13.0 - 17.0 g/dL   HCT 75.9 (L) 60.9 - 47.9 %  MRSA Next Gen by PCR, Nasal     Status: None   Collection Time: 01/16/24  8:47 PM   Specimen: Nasal Mucosa; Nasal  Swab  Result Value Ref Range   MRSA by PCR Next Gen NOT DETECTED NOT DETECTED    Comment: (NOTE) The GeneXpert MRSA Assay (FDA approved for NASAL specimens only), is one component of a comprehensive MRSA colonization surveillance program. It is not intended to diagnose MRSA infection nor to guide or monitor treatment for MRSA infections. Test performance is not FDA approved in patients less than 80 years old. Performed at [facility], [address], [place], [place] [address]   Basic metabolic panel     Status: Abnormal   Collection Time: 01/17/24  4:41 AM  Result Value Ref Range   Sodium 140 135 - 145 mmol/L   Potassium 4.1 3.5 - 5.1 mmol/L   Chloride 108 98 - 111 mmol/L   CO2 22 22 - 32 mmol/L   Glucose, Bld 87 70 - 99 mg/dL    Comment: Glucose reference range applies only to samples taken after fasting for at least 8 hours.   BUN 31 (H) 8 - 23 mg/dL   Creatinine, Ser 9.33 0.61 - 1.24 mg/dL   Calcium  8.1 (L) 8.9 - 10.3 mg/dL   GFR, Estimated >39 >39 mL/min    Comment: (NOTE) Calculated using the CKD-EPI Creatinine Equation (2021)    Anion gap 10 5 - 15    Comment: Performed at [facility], [address], [place], [place] [address]  CBC     Status: Abnormal   Collection Time: 01/17/24  4:41 AM  Result Value Ref Range   WBC 4.8 4.0 - 10.5 K/uL   RBC 2.55 (L) 4.22 - 5.81 MIL/uL   Hemoglobin 7.0 (L) 13.0 - 17.0 g/dL   HCT 77.8 (L) 60.9 - 47.9 %   MCV 86.7 80.0 - 100.0 fL   MCH 27.5 26.0  - 34.0 pg   MCHC 31.7 30.0 - 36.0 g/dL   RDW 83.5 (H) 88.4 - 84.4 %   Platelets 191 150 - 400 K/uL   nRBC 0.0 0.0 - 0.2 %    Comment: Performed at [facility], [address], [place], [place] [address]  Blood gas, arterial     Status: Abnormal   Collection Time: 01/17/24 12:35 PM  Result Value Ref Range   pH, Arterial 7.36 7.35 - 7.45   pCO2 arterial 42 32 - 48 mmHg   pO2, Arterial 206 (H) 83 - 108 mmHg   Bicarbonate 23.7 20.0 - 28.0 mmol/L   Acid-base deficit 1.8 0.0 - 2.0 mmol/L   O2 Saturation 100 %   Patient temperature 36.9    Collection site RIGHT RADIAL    Drawn by [id number]    Allens test (pass/fail) PASS PASS    Comment: Performed at [facility], [address] [place] [address], [place], [place] [address]    DG CHEST PORT 1 VIEW Result Date: 01/17/2024 EXAM: 1 VIEW(S) XRAY OF THE CHEST 01/17/2024 11:39:00 AM COMPARISON: 10/31/2023 CLINICAL HISTORY: Endotracheally intubated FINDINGS: LINES, TUBES AND DEVICES: Endotracheal tube in place, 3.9 cm above the carina. LUNGS AND PLEURA: Dense opacity in left mid to lower lung. Suspected left pleural effusion. Mild atelectasis within right lung. Mild pulmonary edema. No pneumothorax. HEART AND MEDIASTINUM: Cardiomegaly. BONES AND SOFT TISSUES: No acute osseous abnormality. IMPRESSION: 1. Dense opacity in left mid to lower lung, suspected left pleural effusion, and mild atelectasis within right lung. 2. Mild pulmonary edema. 3. Cardiomegaly. Electronically signed by: Evalene Coho MD 01/17/2024 12:21 PM EST RP Workstation: [id number]   Personally reviewed CT angiography 12/6- lots of scatter from the  coil location in the GDA EXAM: CTA ABDOMEN AND PELVIS WITH CONTRAST 01/06/2024 09:58:06 PM   TECHNIQUE: CTA images of the abdomen and pelvis with intravenous contrast. 100 mL iohexol  (OMNIPAQUE ) 350 MG/ML injection. Three-dimensional MIP/volume rendered formations were performed. Automated exposure control, iterative reconstruction, and/or weight based  adjustment of the mA/kV was utilized to reduce the radiation dose to as low as reasonably achievable.   COMPARISON: Chest CT 08/26/2023.   CLINICAL HISTORY: Melena.   FINDINGS:   VASCULATURE: GI BLEED: No active extravasation of contrast within the GI tract.   AORTA: Coronary artery and aortic atherosclerosis. No acute finding. No abdominal aortic aneurysm. No dissection.   CELIAC TRUNK: No acute finding. No occlusion or significant stenosis.   SUPERIOR MESENTRIC ARTERY: No acute finding. No occlusion or significant stenosis.   INFERIOR MESENTERIC ARTERY: No acute finding. No occlusion or significant stenosis.   RENAL ARTERIES: No acute finding. No occlusion or significant stenosis.   ILIAC ARTERIES: No acute finding. No occlusion or significant stenosis.   ABDOMEN/PELVIS: LOWER CHEST: Moderate left pleural effusion, similar to prior chest CT.   LIVER: The liver is unremarkable.   GALLBLADDER AND BILE DUCTS: Gallbladder is unremarkable. No biliary ductal dilatation.   SPLEEN: The spleen is unremarkable.   PANCREAS: The pancreas is unremarkable.   ADRENAL GLANDS: A fat-containing mass in the right adrenal gland measures 6.5 cm, stable since the prior study, compatible with myelolipoma.   KIDNEYS, URETERS AND BLADDER: 5.4 cm mid pole right renal cyst. 6.6 cm left lower pole cyst. No stones in the kidneys or ureters. No hydronephrosis. No perinephric or periureteral stranding. Large diverticulum off the left side of the bladder.   GI AND BOWEL: Stomach and duodenal sweep demonstrate no acute abnormality. Extensive colonic diverticulosis. No active diverticulitis. Normal appendix. There is no bowel obstruction. No abnormal bowel wall thickening or distension.   REPRODUCTIVE: Extensive prostate calcifications. Prostatomegaly.   PERITONEUM AND RETROPERITONEUM: No ascites or free air.   LYMPH NODES: No lymphadenopathy.   BONES AND SOFT TISSUES: No  acute abnormality of the bones. No acute soft tissue abnormality.   IMPRESSION: 1. No active GI bleeding. 2. Aortic atherosclerosis. 3. Extensive colonic diverticulosis without evidence of diverticulitis. 4. Moderate left pleural effusion, similar to prior chest CT.   Electronically signed by: Franky Crease MD 01/06/2024 10:16 PM EST RP Workstation: [id number]   Assessment & Plan:  Raymond Hurst is a 73 y.o. male with an area of bleeding in the duodenal bulb. Controlled now with spray per GI and patient has received blood replacement. He is no longer hypotensive and is not on any pressors. I was called by Dr. Cindie as an RICK and was asked to see him and look at his findings.  Given the bleeding and the tissue near it, I am not sure what we are dealing with. I have looked up information about coil erosion but the coil was actually visualized on that case reports EGD. This area looks irritated and more ulcerated like but recurrent ulcers are possible. Dr. Cindie is unsure if this was the only source of bleeding or if there could have been another source.   Dr. Cindie and Dr. Arlon have spoken to colleagues at [facility] and patient is getting transferred. Dr. Cindie has discussed the case with IR who says they could attempt an angiography but wanted surgery involved.  Given the patient's stage IV lung cancer with metastasis and other co-morbidities then and the area of bleeding, I would say  exhausting all non operative measures is appropriate. Any surgical intervention would be very high risk and with the coil in place from August, the surgery would be more involved than just GDA ligation and could mean a duodendectomy/ Roux en Y reconstruction.     Manuelita JAYSON Pander 01/17/2024, 1:55 PM          [1]  Social History Tobacco Use   Smoking status: Former    Current packs/day: 0.00    Average packs/day: 2.0 packs/day for 20.0 years (40.0 ttl pk-yrs)    Types: Cigarettes    Start date:  01/31/1970    Quit date: 01/31/1990    Years since quitting: 33.9   Smokeless tobacco: Never  Vaping Use   Vaping status: Never Used  Substance Use Topics   Alcohol use: Not Currently    Comment: 4-5 beer daily   Drug use: No  [2] No Known Allergies

## 2024-01-17 NOTE — Anesthesia Procedure Notes (Signed)
 Procedure Name: Intubation Date/Time: 01/17/2024 10:28 AM  Performed by: Elaine Delon CROME, CRNAPre-anesthesia Checklist: Patient identified, Emergency Drugs available, Suction available and Patient being monitored Patient Re-evaluated:Patient Re-evaluated prior to induction Oxygen Delivery Method: Circle system utilized Preoxygenation: Pre-oxygenation with 100% oxygen Induction Type: IV induction, Rapid sequence and Cricoid Pressure applied Laryngoscope Size: Glidescope and 3 Grade View: Grade I Tube type: Oral Tube size: 7.5 mm Number of attempts: 1 Airway Equipment and Method: Rigid stylet and Video-laryngoscopy Placement Confirmation: ETT inserted through vocal cords under direct vision, positive ETCO2 and breath sounds checked- equal and bilateral Secured at: 23 cm Tube secured with: Tape Dental Injury: Teeth and Oropharynx as per pre-operative assessment

## 2024-01-17 NOTE — Op Note (Addendum)
 [facility] Patient Name: Raymond Hurst Procedure Date: 01/17/2024 9:58 AM MRN: [id number] Date of Birth: Mar 25, 1950 Attending MD: Carlin POUR. Cindie , [place], [id number] CSN: [id number] Age: 73 Admit Type: Outpatient Procedure:                Upper GI endoscopy Indications:              Acute post hemorrhagic anemia, Hematemesis Providers:                Carlin POUR. Cindie, DO, Devere Lodge, Chad Wilson,                            Technician Referring MD:              Medicines:                See the Anesthesia note for documentation of the                            administered medications Complications:            No immediate complications. Estimated Blood Loss:     Estimated blood loss: 50-100 mL. Procedure:                Pre-Anesthesia Assessment:                           - The anesthesia plan was to use monitored                            anesthesia care (MAC).                           After obtaining informed consent, the endoscope was                            passed under direct vision. Throughout the                            procedure, the patient's blood pressure, pulse, and                            oxygen saturations were monitored continuously. The                            [id number] ([id number]) Upper was introduced through                            the mouth, and advanced to the second part of                            duodenum. The upper GI endoscopy was technically                            difficult and complex due to excessive bleeding.                            The patient tolerated  the procedure well. Scope In: 10:25:51 AM Scope Out: 10:48:50 AM Total Procedure Duration: 0 hours 22 minutes 59 seconds  Findings:      The examined esophagus was normal.      Large amount of fresh red blood was found in the entire examined       stomach. Endoscope removed. Successful endotracheal intubation then       performed by anesthesia team. Endoscope then  reintroduced through the       mouth and procedure resumed.      Red blood and clot material was found in the duodenal bulb, in the first       portion of the duodenum and in the second portion of the duodenum. This       made visualization very difficult. Extensive lavage with sterile water        and suctioning of blood/clot material was performed. Once able to       somewhat visualize the duodenal bulb, large protrusion with surrounding       eroded mucosa, actively bleeding was found. Previous protrusion noted on       prior EGD though this appeared more pronounced today. ?eroded coil This       lesion was actively bleeding in multiple spots (see pictures).      For location marking, one hemostatic clip was successfully placed (MR       conditional) in the duodenal bulb. Clip manufacturer: [employer].      As the site was bleeding from multiple areas, Hemospray then used for       hemostasis purposes in the entire dudoenal bulb (2 catheters used). Impression:               - Normal esophagus.                           - Red blood in the entire stomach.                           - Blood in the duodenal bulb and in the second                            portion of the duodenum.                           - One hemostatic clip was successfully placed (MR                            conditional) in the duodenal bulb for marking                            purposes for possible IR. Clip manufacturer: [employer].                           - As the site was bleeding from multiple areas,                            Hemospray then used for hemostasis purposes in  the                            entire dudoenal bulb (2 catheters used).                           - No specimens collected.                           - Endoscopic findings raise question of possible                            eroded coil from previous embolization though                             difficult to tell based on limited exam today. Moderate Sedation:      Per Anesthesia Care Recommendation:           - Return patient to ICU for ongoing care.                           - Leave patient intubated. Will discuss case                            further with IR for likely transfer to [facility].                           - Change PPI to IV PPI gtt                           - Finish 2nd unit of PRBCs. Continue to monitor H&H                            and transfuse for less than 7 or hemodynamic                            instability Procedure Code(s):        --- Professional ---                           [id number], Esophagogastroduodenoscopy, flexible,                            transoral; diagnostic, including collection of                            specimen(s) by brushing or washing, when performed                            (separate procedure)                           44799, Unlisted procedure, small intestine Diagnosis Code(s):        --- Professional ---  K92.2, Gastrointestinal hemorrhage, unspecified                           D62, Acute posthemorrhagic anemia                           K92.0, Hematemesis CPT copyright 2022 American [employer]. All rights reserved. The codes documented in this report are preliminary and upon coder review may  be revised to meet current compliance requirements. Carlin POUR. Cindie, DO Carlin POUR. Cindie, DO 01/17/2024 11:08:29 AM This report has been signed electronically. Number of Addenda: 0

## 2024-01-17 NOTE — Anesthesia Preprocedure Evaluation (Signed)
 Anesthesia Evaluation  Patient identified by MRN, date of birth, ID band Patient awake    Reviewed: Allergy & Precautions, H&P , NPO status , Patient's Chart, lab work & pertinent test results, reviewed documented beta blocker date and time   Airway Mallampati: II  TM Distance: >3 FB Neck ROM: full    Dental no notable dental hx.    Pulmonary sleep apnea , COPD, former smoker   Pulmonary exam normal breath sounds clear to auscultation       Cardiovascular Exercise Tolerance: Good hypertension, + DOE  + dysrhythmias  Rhythm:regular Rate:Normal     Neuro/Psych  PSYCHIATRIC DISORDERS      TIACVA    GI/Hepatic negative GI ROS, Neg liver ROS,,,  Endo/Other    Class 3 obesity  Renal/GU negative Renal ROS  negative genitourinary   Musculoskeletal   Abdominal   Peds  Hematology  (+) Blood dyscrasia, anemia   Anesthesia Other Findings   Reproductive/Obstetrics negative OB ROS                              Anesthesia Physical Anesthesia Plan  ASA: 4 and emergent  Anesthesia Plan: MAC   Post-op Pain Management:    Induction:   PONV Risk Score and Plan: Propofol  infusion  Airway Management Planned:   Additional Equipment:   Intra-op Plan:   Post-operative Plan:   Informed Consent: I have reviewed the patients History and Physical, chart, labs and discussed the procedure including the risks, benefits and alternatives for the proposed anesthesia with the patient or authorized representative who has indicated his/her understanding and acceptance.     Dental Advisory Given  Plan Discussed with: CRNA  Anesthesia Plan Comments:         Anesthesia Quick Evaluation

## 2024-01-17 NOTE — Progress Notes (Signed)
 01/17/2024 Accepted to [facility] for high risk UGIB with IR aware of patient. Plan resuscitate, CT GIB if ongoing bleeding.  CCS if refractory but not good surgical candidate.  Rolan Sharps MD PCCM

## 2024-01-17 NOTE — Transfer of Care (Addendum)
 Immediate Anesthesia Transfer of Care Note  Patient: Raymond Hurst  Procedure(s) Performed: EGD (ESOPHAGOGASTRODUODENOSCOPY) CONTROL OF HEMORRHAGE, GI TRACT, ENDOSCOPIC, BY CLIPPING OR OVERSEWING  Patient Location: ICU  Anesthesia Type:General  Level of Consciousness: Patient remains intubated per anesthesia plan  Airway & Oxygen Therapy: Patient remains intubated per anesthesia plan and Patient placed on Ventilator (see vital sign flow sheet for setting)  Post-op Assessment: Report given to RN and Post -op Vital signs reviewed and stable  Post vital signs: Reviewed and stable  Last Vitals:  Vitals Value Taken Time  BP 131/61   Temp    Pulse 113 01/17/24 11:08  Resp 22 01/17/24 11:08  SpO2 100 % 01/17/24 11:08  Vitals shown include unfiled device data.  Last Pain:  Vitals:   01/17/24 1021  TempSrc:   PainSc: 0-No pain         Complications: No notable events documented. Planned to keep patient intubated for transfer to higher level of care.

## 2024-01-17 NOTE — Anesthesia Postprocedure Evaluation (Signed)
 Anesthesia Post Note  Patient: Raymond Hurst  Procedure(s) Performed: LAPAROTOMY, EXPLORATORY     Patient location during evaluation: SICU Anesthesia Type: General Level of consciousness: sedated Pain management: pain level controlled Vital Signs Assessment: post-procedure vital signs reviewed and stable Respiratory status: patient remains intubated per anesthesia plan Cardiovascular status: stable Postop Assessment: no apparent nausea or vomiting Anesthetic complications: no   No notable events documented.  Last Vitals:  Vitals:   01/17/24 2100 01/17/24 2115  BP:  (!) 153/96  Pulse: 94 85  Resp: (!) 35 (!) 35  Temp:  (!) 35.6 C  SpO2: 96%     Last Pain:  Vitals:   01/17/24 2115  TempSrc: Bladder  PainSc:                  Garnette FORBES Skillern

## 2024-01-17 NOTE — Progress Notes (Addendum)
 [facility]  As I was writing my note near RN station, patient needed to urinate and got agitated as the RN was helping him with the urinal. Patient was bolused fentanyl  and his BP dropped from this medication. He had been 90/60s and then dropped to 40/50s. Levophed  was started.   [facility] also arrived at this time and his H&H came back still low at 6.8. Dr. Arlon was made aware and he ordered stat units to be given.   I placed a central line given the drop of the BP and need for more blood and placed right internal jugular. I tried to call his wife but she did not answer. This line was placed emergently.  (See procedure note).  At the end of the line placement, the patient was interacting with me and following commands. His BP remained 81/54 on levo at 20.    I have reached out to [facility] and will update them.   I do think exhausting the medical options, fully resuscitating him, getting repeat H&H more regularly q4 etc, all things that can happen at ICU with critical care physicians is appropriate.    He is receiving more pRBC now and [facility] is awaiting the blood to transport him down.   CXR reviewed and CVL at [facility] and no PTX.  I did reach [facility] via Epic chat before the patient left and they were aware of him.   Manuelita Pander, MD [facility] [address] [place], [place] [address] [phone] (office)

## 2024-01-17 NOTE — Plan of Care (Signed)
°  Problem: Clinical Measurements: Goal: Ability to maintain clinical measurements within normal limits will improve Outcome: Progressing   Problem: Nutrition: Goal: Adequate nutrition will be maintained Outcome: Progressing   Problem: Pain Managment: Goal: General experience of comfort will improve and/or be controlled Outcome: Progressing   Problem: Skin Integrity: Goal: Risk for impaired skin integrity will decrease Outcome: Progressing   Problem: Metabolic: Goal: Ability to maintain appropriate glucose levels will improve Outcome: Progressing   Problem: Tissue Perfusion: Goal: Adequacy of tissue perfusion will improve Outcome: Progressing   Problem: Skin Integrity: Goal: Risk for impaired skin integrity will decrease Outcome: Progressing

## 2024-01-17 NOTE — Progress Notes (Signed)
 The following orders were given verbally by Kara Sharps, and Stretch CCM MD at bedside and carried out by Medical ICU staff. 1amp bicarb @ 1714, 1amp calcium  @ 1714, 1amp epi @ 1716, 100 fent @ 1718, 4mg  veresed @ 1718, 100mg  fent @ 1728, 1amp epi @ 1738, 1amp calcium  @ 1738, 1amp bicarb @ 1738, 100 fent @ 1740, 2g versed  @ 1740, 1amp calcium  @ 1741, TXA @ 1745, 1amp bicarb @ 1749, bicarb 1amp @1755 , 1g ca @ 1756, 2g ca @ 1800, roc 80 @ 1801, 1amp epi @ 1805, epi gtt @ 1808, 1 amp epi @ 1810, 1amp bicarb @ 1811, 50 benadryl  @ 1812, 2g ca @ 1812, novoseven  @ 1811, 25 solumed @ 1818. Additional medications were titrated via Alaris pump for hemodynamic stability levophed  and Epinephrine . MTP was in process during this time as well and hard copies were placed in the patients chart.

## 2024-01-17 NOTE — Progress Notes (Signed)
 At approximately 1405 the patient became agitated while on the ventilator with versed  and fentanyl  running for sedation. He sat up in the bed and was signaling to me he had to pee with a panicked look in his eyes. The bed was already soiled with urine but I quickly grabbed the urinal and held it for him while simultaneously increasing his sedation by the ordered amount and giving him 50mcg bolus of fentanyl  as discussed with MD. BP at that time was 101/65. He calmed back down and I took care of the urine. About 10 minutes later [facility] was coming through the doors and I was taking another BP that read in the 50's systolic. I already had the levophed  hanging ready to go for such occasion so I grabbed it and got it hooked up. Started the levo at 5mcg. His BP's were remaining low so with MD and [facility] at bedside we started titrating up on the levo quickly, while simultaneously titrating down on the sedation. After working on him for some time, placing a foley, getting a central line with Dr. Kallie, and starting more blood the pt's BP's improved and he became alert once more. Pt transferred to 27M.

## 2024-01-17 NOTE — Consult Note (Addendum)
 Agree with the below findings as written. The patient was presented to me by the APP (Advanced Practice Provider) and I have also seen and examined the patient independently. Please see my key portion of the encounter as documented.  Patient is a pleasant 73 year old Hurst with history of atrial fibrillation chronically on Eliquis , OSA, stroke, COPD, SCC of the lung with metastases to adrenal gland, history of multiple upper GI bleeds due to duodenal ulcer status post coil embolization 09/09/2023 who presented to [facility] ER yesterday with chief complaint of hematemesis.  Similar admission to our facility 01/06/2024 for acute blood loss anemia and melena.   EGD: 01/08/24 - Normal esophagus.                           - Gastritis. Biopsied.                           - Non-bleeding duodenal ulcer with a clean ulcer                            base (Forrest Class III).  Was discharged on twice daily PPI, told to avoid NSAIDs, resume Eliquis .  States he did well until yesterday morning when he had 1 episode of hematemesis.  Denies any associate abdominal pain or nausea.  Has been taking his PPI appropriately.  States his last dose of Eliquis  was 2 days ago.  In the ER, initially found to be hypotensive with blood pressure 83/48, responded somewhat to IV fluids with improvement to 91/57.  Was given 1 unit PRBCs and his blood pressure improved.  Initial hemoglobin 7.7, up to 8.2 after 1 unit of blood, down to 7.0 this morning.  2 more units of PRBCs ordered.  1.  Acute upper GI bleed, hematemesis-etiology likely his duodenal ulcer.  Will proceed with EGD to further evaluate. The risks including infection, bleed, perforation or even death as well as benefits, limitations, alternatives and imponderables have been reviewed with the patient. Questions have been answered. All parties agreeable.  Continue on IV PPI.  Keep patient NPO.  2.  Acute blood loss anemia-due to above.  Currently receiving 2 units of  PRBCs.  Carlin POUR. Cindie, D.O. Gastroenterology and Hepatology [facility]  **After further discussion with patient, he states he got his days mixed up and his last Eliquis  dosing was actually yesterday.  I discussed increased risk of bleeding with EGD as well as possible manipulation/treatment in regards to his ulcer.  He understands and is agreeable**      Gastroenterology Consult   Referring Provider: No ref. 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 GI consulted for further evaluation  Hgb 7.7 on arrival, 8.2 s/p1 unit PRBCs --hgb 7 this AM BUN 28   Consult: Patient reports feeling his usual state of health until carrying a large water  bucket outside this morning and suddenly felt weak and short of breath. He sat down to call 911 and reports coughing up some BRB. He denies any melena or BRBPR at home but patient's RN reported a large volume stool with BRB earlier this am. Denies abdominal pain, nausea. Taking PPI BID, no new medications or antibiotics recently. Denies constipation, diarrhea. Reports some rumbling in his stomach recently with passage of  more flatus. Drinks about a 6 pack of beer on the weekends, some liquor on occasion but not heavy intake.    Last EGD: 01/08/24 - Normal esophagus.                           - Gastritis. Biopsied.                           - Non-bleeding duodenal ulcer with a clean ulcer                            base (Forrest Class III).                           - Normal second portion of the duodenum. Last TCS: 2-3 years ago with [facility],reports benign polyps   Past Medical History:  Diagnosis Date   Adrenal tumor    right adrenal gland tumor favored to be a myelolipoma   Atrial fibrillation (HCC)    Cancer (HCC)    stage 4 Lung   Cellulitis of right lower leg    COPD (chronic obstructive pulmonary disease) (HCC)    Dysrhythmia    A-fib   GI bleed    History of TIA (transient ischemic attack)    May 2020   Lymphedema    OSA on CPAP    severe obstructive sleep apnea with an AHI of 54/h with no significant central events.  He had nocturnal hypoxemia with O2 sat nadir of 70%. Now on CPAP   Pulmonary mass    Stroke [facility])     Past Surgical History:  Procedure Laterality Date   BRONCHIAL BIOPSY  06/12/2023   Procedure: BRONCHOSCOPY, WITH BIOPSY;  Surgeon: Shelah Lamar RAMAN, MD;  Location: [facility] ENDOSCOPY;  Service: Cardiopulmonary;;   BRONCHIAL BRUSHINGS  06/12/2023   Procedure: BRONCHOSCOPY, WITH BRUSH BIOPSY;  Surgeon: Shelah Lamar RAMAN, MD;  Location: [facility] ENDOSCOPY;  Service: Cardiopulmonary;;   BRONCHIAL NEEDLE ASPIRATION BIOPSY  06/12/2023   Procedure: BRONCHOSCOPY, WITH NEEDLE ASPIRATION BIOPSY;  Surgeon: Shelah Lamar RAMAN, MD;  Location: [facility] ENDOSCOPY;  Service: Cardiopulmonary;;   BRONCHIAL NEEDLE ASPIRATION BIOPSY  08/28/2023   Procedure: BRONCHOSCOPY, WITH NEEDLE ASPIRATION BIOPSY;  Surgeon: Shelah Lamar RAMAN, MD;  Location: [facility] ENDOSCOPY;  Service: Pulmonary;;   CRYOTHERAPY  08/28/2023   Procedure: CRYOTHERAPY;  Surgeon: Shelah Lamar RAMAN, MD;  Location: [facility] ENDOSCOPY;  Service: Pulmonary;;    ESOPHAGOGASTRODUODENOSCOPY N/A 09/09/2023   Procedure: EGD (ESOPHAGOGASTRODUODENOSCOPY);  Surgeon: [facility] Lamar HERO, MD;  Location: [facility] ENDO SUITE;  Service: Endoscopy;  Laterality: N/A;   ESOPHAGOGASTRODUODENOSCOPY N/A 01/08/2024   Procedure: EGD (ESOPHAGOGASTRODUODENOSCOPY);  Surgeon: Cindie Carlin POUR, DO;  Location: [facility] ENDO SUITE;  Service: Endoscopy;  Laterality: N/A;   HEMOSTASIS CLIP PLACEMENT  06/12/2023   Procedure: CONTROL OF HEMORRHAGE bronch cold saline and epi;  Surgeon: Shelah Lamar RAMAN, MD;  Location: [facility] ENDOSCOPY;  Service: Cardiopulmonary;;   INNER EAR SURGERY     IR ANGIOGRAM SELECTIVE EACH ADDITIONAL VESSEL  09/09/2023   IR ANGIOGRAM VISCERAL SELECTIVE  09/09/2023   IR ANGIOGRAM VISCERAL SELECTIVE  09/09/2023   IR EMBO ART  VEN HEMORR LYMPH EXTRAV  INC GUIDE ROADMAPPING  09/09/2023   IR THORACENTESIS ASP PLEURAL SPACE W/IMG GUIDE  09/26/2023   IR US  GUIDE VASC ACCESS RIGHT  09/09/2023   KNEE SURGERY Left    THORACENTESIS Left 06/06/2023   Procedure: THORACENTESIS;  Surgeon: Arlinda Ranks, MD;  Location: [facility] ENDOSCOPY;  Service: Pulmonary;  Laterality: Left;   TONSILLECTOMY     VIDEO BRONCHOSCOPY WITH ENDOBRONCHIAL NAVIGATION Left 08/28/2023   Procedure: VIDEO BRONCHOSCOPY WITH ENDOBRONCHIAL NAVIGATION;  Surgeon: Shelah Lamar RAMAN, MD;  Location: [facility] ENDOSCOPY;  Service: Pulmonary;  Laterality: Left;   VIDEO BRONCHOSCOPY WITH ENDOBRONCHIAL ULTRASOUND Left 06/12/2023   Procedure: BRONCHOSCOPY, WITH EBUS;  Surgeon: Shelah Lamar RAMAN, MD;  Location: [facility] ENDOSCOPY;  Service: Cardiopulmonary;  Laterality: Left;   VIDEO BRONCHOSCOPY WITH ENDOBRONCHIAL ULTRASOUND  08/28/2023   Procedure: BRONCHOSCOPY, WITH EBUS;  Surgeon: Shelah Lamar RAMAN, MD;  Location: [facility] ENDOSCOPY;  Service: Pulmonary;;    Prior to Admission medications  Medication Sig Start Date End Date Taking? Authorizing Provider  acetaminophen  (TYLENOL ) 500 MG tablet Take 500 mg by mouth every 6 (six) hours as needed for mild pain. Patient not  taking: Reported on 01/12/2024    [provider]  allopurinol  (ZYLOPRIM ) 100 MG tablet Take 100 mg by mouth daily. 05/25/22   [provider]  apixaban  (ELIQUIS ) 5 MG TABS tablet Take 1 tablet (5 mg total) by mouth 2 (two) times daily. Okay to restart this medication on 08/29/2023 01/09/24   Ricky Fines, MD  budesonide -glycopyrrolate -formoterol  (BREZTRI  AEROSPHERE) 160-9-4.8 MCG/ACT AERO inhaler Inhale 2 puffs into the lungs in the morning and at bedtime. 10/11/23   Hattar, Zola SAILOR, MD  cholecalciferol  (VITAMIN D3) 25 MCG (1000 UNIT) tablet Take 1,000 Units by mouth daily.    [provider]  cyanocobalamin  (VITAMIN B12) 100 MCG tablet Take 100 mcg by mouth daily.    [provider]  folic acid  (FOLVITE ) 1 MG tablet TAKE 1 TABLET BY MOUTH EVERY DAY 12/07/23   Rourk, Lamar HERO, MD  furosemide  (LASIX ) 40 MG tablet Take 20 mg by mouth daily.    [provider]  ipratropium-albuterol  (DUONEB) 0.5-2.5 (3) MG/3ML SOLN Take 3 mLs by nebulization every 6 (six) hours as needed. 10/31/23   Hattar, Zola SAILOR, MD  loperamide  (IMODIUM  A-D) 2 MG tablet Take 2-4 mg by mouth 4 (four) times daily as needed for diarrhea or loose stools. Patient not taking: Reported on 01/12/2024    [provider]  metoprolol  tartrate (LOPRESSOR ) 25 MG tablet Take 1 tablet (25 mg total) by mouth 2 (two) times daily. 11/17/21   Strader, Laymon HERO, PA-C  pantoprazole  (PROTONIX ) 40 MG tablet Take 1 tablet (40 mg total) by mouth 2 (two) times daily before a meal. 10/17/23   Rourk, Lamar HERO, MD  thiamine  (VITAMIN B-1) 100 MG tablet Take 100 mg by mouth daily.    [provider]    Current Facility-Administered Medications  Medication Dose Route Frequency Provider Last Rate Last Admin   0.9 %  sodium chloride  infusion (Manually program via Guardrails  IV Fluids)   Intravenous Once Suzette Pac, MD       0.9 %  sodium chloride  infusion (Manually program via Guardrails IV Fluids)    Intravenous Once Calkins, Derek W, DO       Chlorhexidine  Gluconate Cloth 2 % PADS 6 each  6 each Topical Daily Adefeso, Oladapo, DO       furosemide  (LASIX ) injection 20 mg  20 mg Intravenous Once Calkins, Derek W, DO       furosemide  (LASIX ) injection 20 mg  20 mg Intravenous Once Calkins, Derek W, DO       ipratropium-albuterol  (DUONEB) 0.5-2.5 (3) MG/3ML nebulizer solution 3 mL  3 mL Nebulization Q4H PRN Dorinda Drue DASEN, MD       ondansetron  (ZOFRAN ) injection 4 mg  4 mg Intravenous Q6H PRN Dorinda Drue DASEN, MD       Oral care mouth rinse  15 mL Mouth Rinse PRN Dorinda Drue DASEN, MD       pantoprazole  (PROTONIX ) injection 40 mg  40 mg Intravenous Q12H Dorinda Drue DASEN, MD        Allergies as of 01/16/2024   (No Known Allergies)    Family History  Problem Relation Age of Onset   Heart failure Mother    COPD Mother    Atrial fibrillation Mother    Idiopathic pulmonary fibrosis Father     Social History   Socioeconomic History   Marital status: Married    Spouse name: Not on file   Number of children: Not on file   Years of education: Not on file   Highest education level: Not on file  Occupational History   Not on file  Tobacco Use   Smoking status: Former    Current packs/day: 0.00    Average packs/day: 2.0 packs/day for 20.0 years (40.0 ttl pk-yrs)    Types: Cigarettes    Start date: 01/31/1970    Quit date: 01/31/1990    Years since quitting: 33.9   Smokeless tobacco: Never  Vaping Use   Vaping status: Never Used  Substance and Sexual Activity   Alcohol use: Not Currently    Comment: 4-5 beer daily   Drug use: No   Sexual activity: Not on file  Other Topics Concern   Not on file  Social History Narrative   Right handed.    Social Drivers of Health   Tobacco Use: Medium Risk (01/16/2024)   Patient History    Smoking Tobacco Use: Former    Smokeless Tobacco Use: Never    Passive Exposure: Not on file  Financial Resource Strain: High Risk (06/15/2023)   Overall  Financial Resource Strain (CARDIA)    Difficulty of Paying Living Expenses: Very hard  Food Insecurity: No Food Insecurity (01/16/2024)   Epic    Worried About Programme Researcher, Broadcasting/film/video in the Last Year: Never true    Ran Out of Food in the Last Year: Never true  Transportation Needs: No Transportation Needs (01/16/2024)   Epic    Lack of Transportation (Medical): No    Lack of Transportation (Non-Medical): No  Physical Activity: Inactive (06/15/2023)   Exercise Vital Sign    Days of Exercise per Week: 0 days    Minutes of Exercise per Session: 0 min  Stress: No Stress Concern Present (06/15/2023)   [employer] of Occupational Health - Occupational Stress Questionnaire    Feeling of Stress : Only a little  Social Connections: Moderately Isolated (01/16/2024)   Social Connection and Isolation  Panel    Frequency of Communication with Friends and Family: More than three times a week    Frequency of Social Gatherings with Friends and Family: More than three times a week    Attends Religious Services: Never    Database Administrator or Organizations: No    Attends Banker Meetings: Never    Marital Status: Married  Catering Manager Violence: Not At Risk (01/16/2024)   Epic    Fear of Current or Ex-Partner: No    Emotionally Abused: No    Physically Abused: No    Sexually Abused: No  Depression (PHQ2-9): Low Risk (01/12/2024)   Depression (PHQ2-9)    PHQ-2 Score: 0  Alcohol Screen: Low Risk (06/15/2023)   Alcohol Screen    Last Alcohol Screening Score (AUDIT): 5  Housing: Unknown (01/16/2024)   Epic    Unable to Pay for Housing in the Last Year: No    Number of Times Moved in the Last Year: Not on file    Homeless in the Last Year: No  Utilities: Not At Risk (01/16/2024)   Epic    Threatened with loss of utilities: No  Health Literacy: Not on file     Review of Systems   Gen: Denies any fever, chills, loss of appetite, change in weight or weight loss CV: Denies  chest pain, heart palpitations, syncope, edema  Resp: Denies shortness of breath with rest, cough, wheezing, coughing up blood, and pleurisy. GI: +hematemesis +excessive passage of flatus +BRBPR GU : Denies urinary burning, blood in urine, urinary frequency, and urinary incontinence. MS: Denies joint pain, limitation of movement, swelling, cramps, and atrophy.  Derm: Denies rash, itching, dry skin, hives. Psych: Denies depression, anxiety, memory loss, hallucinations, and confusion. Heme: Denies bruising or bleeding Neuro:  Denies any headaches, dizziness, paresthesias, shaking  Physical Exam   Vital Signs in last 24 hours: Temp:  [97.5 F (36.4 C)-98.3 F (36.8 C)] 97.9 F (36.6 C) (12/17 0400) Pulse Rate:  [68-100] 68 (12/17 0700) Resp:  [13-31] 15 (12/17 0700) BP: (81-125)/(47-74) 99/47 (12/17 0700) SpO2:  [91 %-100 %] 95 % (12/17 0700) Weight:  [119.7 kg] 119.7 kg (12/16 1620)   General:   Alert,  Well-developed, well-nourished, pleasant and cooperative in NAD Head:  Normocephalic and atraumatic. Eyes:  Sclera clear, no icterus.   Conjunctiva pink. Ears:  Normal auditory acuity. Mouth:  No deformity or lesions, dentition normal. Neck:  Supple; no masses Lungs:  Clear throughout to auscultation.   No wheezes, crackles, or rhonchi. No acute distress. Heart: irregular rhythm-Afib Abdomen:  Soft, nontender and nondistended. No masses, hepatosplenomegaly or hernias noted. Normal bowel sounds, without guarding, and without rebound.   Msk:  Symmetrical without gross deformities. Normal posture. Extremities:  Without clubbing or edema. Neurologic:  Alert and  oriented x4. Skin:  Intact without significant lesions or rashes. Psych:  Alert and cooperative. Normal mood and affect.  Intake/Output from previous day: 12/16 0701 - 12/17 0700 In: 525.2 [Blood:446; IV Piggyback:79.2] Out: 900 [Urine:900] Intake/Output this shift: No intake/output data recorded.   Labs/Studies    Recent Labs Recent Labs    01/16/24 1623 01/16/24 1708 01/17/24 0441  WBC 6.8  --  4.8  HGB 7.7* 8.2* 7.0*  HCT 25.4* 24.0* 22.1*  PLT 257  --  191   BMET Recent Labs    01/16/24 1623 01/16/24 1708 01/17/24 0441  NA 137 137 140  K 3.6 3.6 4.1  CL 103 104 108  CO2  19*  --  22  GLUCOSE 178* 172* 87  BUN 28* 27* 31*  CREATININE 0.79 0.90 0.66  CALCIUM  8.4*  --  8.1*   LFT Recent Labs    01/16/24 1623  PROT 5.7*  ALBUMIN 3.5  AST 13*  ALT 6  ALKPHOS 49  BILITOT 0.6    Assessment   [facility] is a 73 y.o. 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 GI consulted for further evaluation  Melena/history of gastric and duodenal ulcer: -EGD in August with gastric ulcer s/p post coil embolization -EGD on12/8 with duodenal ulcer -Hgb 7.7, up to 8.2 s/p 1 unit PRBCs, down to 7 this morning--transfusing currently 1/2 units  -Denies melena, reports sudden onset of sob, dizziness yesterday AM with hematemesis  -Last dose of eliquis  Monday morning  -Taking protonix  BID at home  -Denies any NSAIDs or recent new meds such as steroids or antibiotics -BRBPR per report of Nursing staff since arriving to ICU -no abdominal pain, constipation, diarrhea or nausea  History of UGIB secondary to gastric/duodenal ulcers, initially with hematemesis yesterday morning but transitioned to BRBPR this morning, hgb down to 7 from 8.2, BP soft at 99/47. His BUN is elevated above baseline. Ultimately cannot rule out rapid transit UGIB.  Last eliquis  dose was 48 hours ago. Will proceed with EGD later this morning with Dr. Cindie. Further recommendations to follow.    Plan / Recommendations   -continue to Hold eliquis  -PPI BID -Avoid all NSAIDs -Trend H&H, transfuse for hgb <7 -monitor for overt GI bleeding -remain NPO  -EGD later this morning    01/17/2024, 8:30 AM  Chelsea L. Carlan, MSN, APRN, AGNP-C Adult-Gerontology Nurse Practitioner [facility]

## 2024-01-17 NOTE — Consult Note (Signed)
 NAME:  Raymond Hurst, MRN:  [id number], DOB:  [birth year], LOS: 0 ADMISSION DATE:  01/16/2024, CONSULTATION DATE:  12/17 REFERRING MD:  Arlon CHIEF COMPLAINT:  GIB   History of Present Illness:  73 year old male with past medical history of atrial fibrillation on Eliquis , stroke, OSA on CPAP, COPD, history of GIB 2/2 gastric ulcer who presented to [facility] ED with hematemesis.   He presented 12/16 with new hematemesis after restarting Eliquis . Had been admitted 01/06/24-01/08/24 with bloody stool found to have gastric ulcer. He was cleared by GI to restart Eliquis . He had episode at home of dizziness, hematemesis. In ED had initial systolics in 80s and given IVF, 1U PRBC, Kcentra , PPI. GI was reconsulted. 12/17 had ongoing blood loss anemia without recurrent hematemesis and was given additional 2U  PRBC. EGD showed duodenal active bleed which was clipped. He was intubated during the procedure and admitted to ICU after for ongoing care. Recommended that he transfer to [facility] for IR consult.   Pertinent  Medical History  atrial fibrillation on Eliquis , stroke, OSA on CPAP, COPD, history of GIB 2/2 gastric ulcer  Left lung mass CT chest 06/05/2023: Left upper lobe mass with invasion of the mediastinum and effacement of the left upper lobe and lower lobe pulmonary arteries, moderate left pleural effusion, small mediastinal lymph nodes, right adrenal mass favoring myolipoma, probable benign lucent lesion at the right scapula Diagnostic left thoracentesis 06/06/2023: Cytology-no malignant cells identified Bronchoscopy with endobronchial ultrasound and biopsies 06/12/2023-lateral left mainstem bronchial irregularity extending into the left upper lobe airways narrowing the left upper lobe airways, no discrete endobronchial lesion.  Endobronchial ultrasound showed a left hilar mass with significant vascularity; enlargement at station 4L, 11L, 7.  Station 7, 4L and 11L lymph nodes negative for malignancy; left hilar  mass/lymph node metastatic carcinoma; left upper lobe endobronchial brushing malignant cells present; left upper lobe endobronchial biopsy suspicious for malignancy; weak focal p63 positivity in the tumor cells, negative for TTF-1, synaptophysin and CD56, a poorly differentiated squamous cell carcinoma is favored; PD-L1 pending; foundation 1-HRD signature/microsatellite status/tumor mutation burden cannot be determined, IDH1 [id number]. PET scan 06/20/2023-large left hilar mass primarily in the left upper lobe.  Left adrenal gland with hypermetabolic activity suspicious for early metastatic lesion. Brain MRI 06/30/2023-negative for metastatic disease Week 1 Taxol /carboplatin  07/10/2023 Radiation chest left adrenal metastasis 07/11/2023-09/01/2023 Week 2 Taxol /carboplatin  07/17/2023 Week 3 Taxol /carboplatin  07/24/2023 Week 4 Taxol /carboplatin  08/01/2023 Week 5 Taxol /carboplatin  08/08/2023 Week 6 Taxol /carboplatin  08/14/2023 Week 7 Taxol /carboplatin  08/23/2023 Bronchoscopy 08/28/2023 with biopsy of the left upper lobe mass-non-small cell carcinoma-adenocarcinoma favored; PD-L1 tumor proportion score 20% Thoracentesis 09/26/2023-1 L of fluid removed, no malignant cells Cycle 1 Durvalumab  10/04/2023 Cycle 2 Durvalumab  11/01/2023 Cycle 3 durvalumab  11/30/2023 Cycle 4 durvalumab  01/12/2024  Significant Hospital Events: Including procedures, antibiotic start and stop dates in addition to other pertinent events   12/16: admit [facility] for hematemesis  12/17: EGD with duodenal bleed clipped, intubated > transfer [facility] for IR eval     Interim History / Subjective:  Arrives to unit awake on vent with dropping pressure, blood around mouth Levo 20 MAPs 50s  Objective   Blood pressure 97/71, pulse 93, temperature 98.4 F (36.9 C), temperature source Oral, resp. rate 18, height 6' (1.829 m), weight 119.7 kg, SpO2 100%.    Vent Mode: PRVC FiO2 (%):  [50 %] 50 % Set Rate:  [18 bmp] 18 bmp Vt Set:  [620 mL] 620 mL PEEP:  [5  cmH20] 5 cmH20 Plateau Pressure:  [17 cmH20]  17 cmH20   Intake/Output Summary (Last 24 hours) at 01/17/2024 1240 Last data filed at 01/17/2024 1100 Gross per 24 hour  Intake 1705.19 ml  Output 1300 ml  Net 405.19 ml   Filed Weights   01/16/24 1620  Weight: 119.7 kg    Examination: General: ill appearing HENT: blood around mouth, ETT in place Lungs: rhonci bilaterally, no accessory muscle use Cardiovascular: tachy,  irregular, looks like afib Abdomen: soft, hypoactive BS Extremities: chronic lymphedema with superimposed 2+ edema Neuro: moving everything on current sedation GU: foley yellow urine  Labs reviewed  Resolved Hospital Problem list    Assessment & Plan:  Hemorrhagic shock 2/2 acute UGIB - duodenal s/p clip  Recent history of gastric ulcer bleed  Post-procedural vent management  COPD  OSA on CPAP  Atrial fibrillation on Eliquis  History of stroke  Oligometastatic NSCLC, squamous cell carcinoma of the LUL lung involving the left adrenal gland.  See PMH above for treatment hx.  Repeat kcentra   MTP x 1 round Check abg Vent bundle Avoid acidemia, coagulopathy, hypothermia, hypocalcemia Stat CTA GIB protocol, will touch base with IR CCS aware Larri) and to call if IR unsuccessful PPI Fent/versed  for RASS -1 to -2  Labs   CBC: Recent Labs  Lab 01/12/24 1048 01/16/24 1623 01/16/24 1708 01/17/24 0441  WBC 6.4 6.8  --  4.8  NEUTROABS 5.2 5.4  --   --   HGB 10.2* 7.7* 8.2* 7.0*  HCT 32.5* 25.4* 24.0* 22.1*  MCV 83.5 87.3  --  86.7  PLT 225 257  --  191    Basic Metabolic Panel: Recent Labs  Lab 01/12/24 1048 01/16/24 1623 01/16/24 1708 01/17/24 0441  NA 138 137 137 140  K 4.0 3.6 3.6 4.1  CL 104 103 104 108  CO2 25 19*  --  22  GLUCOSE 118* 178* 172* 87  BUN 14 28* 27* 31*  CREATININE 0.91 0.79 0.90 0.66  CALCIUM  9.4 8.4*  --  8.1*   GFR: Estimated Creatinine Clearance: 113.1 mL/min (by C-G formula based on SCr of 0.66 mg/dL). Recent  Labs  Lab 01/12/24 1048 01/16/24 1623 01/17/24 0441  WBC 6.4 6.8 4.8    Liver Function Tests: Recent Labs  Lab 01/12/24 1048 01/16/24 1623  AST 14* 13*  ALT 5 6  ALKPHOS 66 49  BILITOT 0.5 0.6  PROT 6.7 5.7*  ALBUMIN 3.6 3.5   No results for input(s): LIPASE, AMYLASE in the last 168 hours. No results for input(s): AMMONIA in the last 168 hours.  ABG    Component Value Date/Time   HCO3 28.5 (H) 06/13/2023 0047   TCO2 20 (L) 01/16/2024 1708   O2SAT 69.4 06/13/2023 0047     Coagulation Profile: No results for input(s): INR, PROTIME in the last 168 hours.  Cardiac Enzymes: No results for input(s): CKTOTAL, CKMB, CKMBINDEX, TROPONINI in the last 168 hours.  HbA1C: Hgb A1c MFr Bld  Date/Time Value Ref Range Status  06/08/2018 12:25 AM 5.2 4.8 - 5.6 % Final    Comment:    (NOTE) Pre diabetes:          5.7%-6.4% Diabetes:              >6.4% Glycemic control for   <7.0% adults with diabetes     CBG: No results for input(s): GLUCAP in the last 168 hours.  Review of Systems:   As above   Past Medical History:  He,  has a past medical history of Adrenal  tumor, Atrial fibrillation (HCC), Cancer (HCC), Cellulitis of right lower leg, COPD (chronic obstructive pulmonary disease) (HCC), Dysrhythmia, GI bleed, History of TIA (transient ischemic attack), Lymphedema, OSA on CPAP, Pulmonary mass, and Stroke (HCC).   Surgical History:   Past Surgical History:  Procedure Laterality Date   BRONCHIAL BIOPSY  06/12/2023   Procedure: BRONCHOSCOPY, WITH BIOPSY;  Surgeon: Shelah Lamar RAMAN, MD;  Location: [facility] ENDOSCOPY;  Service: Cardiopulmonary;;   BRONCHIAL BRUSHINGS  06/12/2023   Procedure: BRONCHOSCOPY, WITH BRUSH BIOPSY;  Surgeon: Shelah Lamar RAMAN, MD;  Location: [facility] ENDOSCOPY;  Service: Cardiopulmonary;;   BRONCHIAL NEEDLE ASPIRATION BIOPSY  06/12/2023   Procedure: BRONCHOSCOPY, WITH NEEDLE ASPIRATION BIOPSY;  Surgeon: Shelah Lamar RAMAN, MD;  Location: [facility]  ENDOSCOPY;  Service: Cardiopulmonary;;   BRONCHIAL NEEDLE ASPIRATION BIOPSY  08/28/2023   Procedure: BRONCHOSCOPY, WITH NEEDLE ASPIRATION BIOPSY;  Surgeon: Shelah Lamar RAMAN, MD;  Location: [facility] ENDOSCOPY;  Service: Pulmonary;;   CRYOTHERAPY  08/28/2023   Procedure: CRYOTHERAPY;  Surgeon: Shelah Lamar RAMAN, MD;  Location: [facility] ENDOSCOPY;  Service: Pulmonary;;   ESOPHAGOGASTRODUODENOSCOPY N/A 09/09/2023   Procedure: EGD (ESOPHAGOGASTRODUODENOSCOPY);  Surgeon: Shaaron Lamar HERO, MD;  Location: [facility] ENDO SUITE;  Service: Endoscopy;  Laterality: N/A;   ESOPHAGOGASTRODUODENOSCOPY N/A 01/08/2024   Procedure: EGD (ESOPHAGOGASTRODUODENOSCOPY);  Surgeon: Cindie Carlin POUR, DO;  Location: [facility] ENDO SUITE;  Service: Endoscopy;  Laterality: N/A;   HEMOSTASIS CLIP PLACEMENT  06/12/2023   Procedure: CONTROL OF HEMORRHAGE bronch cold saline and epi;  Surgeon: Shelah Lamar RAMAN, MD;  Location: [facility] ENDOSCOPY;  Service: Cardiopulmonary;;   INNER EAR SURGERY     IR ANGIOGRAM SELECTIVE EACH ADDITIONAL VESSEL  09/09/2023   IR ANGIOGRAM VISCERAL SELECTIVE  09/09/2023   IR ANGIOGRAM VISCERAL SELECTIVE  09/09/2023   IR EMBO ART  VEN HEMORR LYMPH EXTRAV  INC GUIDE ROADMAPPING  09/09/2023   IR THORACENTESIS ASP PLEURAL SPACE W/IMG GUIDE  09/26/2023   IR US  GUIDE VASC ACCESS RIGHT  09/09/2023   KNEE SURGERY Left    THORACENTESIS Left 06/06/2023   Procedure: THORACENTESIS;  Surgeon: Arlinda Ranks, MD;  Location: [facility] ENDOSCOPY;  Service: Pulmonary;  Laterality: Left;   TONSILLECTOMY     VIDEO BRONCHOSCOPY WITH ENDOBRONCHIAL NAVIGATION Left 08/28/2023   Procedure: VIDEO BRONCHOSCOPY WITH ENDOBRONCHIAL NAVIGATION;  Surgeon: Shelah Lamar RAMAN, MD;  Location: [facility] ENDOSCOPY;  Service: Pulmonary;  Laterality: Left;   VIDEO BRONCHOSCOPY WITH ENDOBRONCHIAL ULTRASOUND Left 06/12/2023   Procedure: BRONCHOSCOPY, WITH EBUS;  Surgeon: Shelah Lamar RAMAN, MD;  Location: [facility] ENDOSCOPY;  Service: Cardiopulmonary;  Laterality: Left;   VIDEO BRONCHOSCOPY WITH ENDOBRONCHIAL  ULTRASOUND  08/28/2023   Procedure: BRONCHOSCOPY, WITH EBUS;  Surgeon: Shelah Lamar RAMAN, MD;  Location: [facility] ENDOSCOPY;  Service: Pulmonary;;     Social History:   reports that he quit smoking about 33 years ago. His smoking use included cigarettes. He started smoking about 53 years ago. He has a 40 pack-year smoking history. He has never used smokeless tobacco. He reports that he does not currently use alcohol. He reports that he does not use drugs.   Family History:  His family history includes Atrial fibrillation in his mother; COPD in his mother; Heart failure in his mother; Idiopathic pulmonary fibrosis in his father.   Allergies Allergies[1]   Home Medications  Prior to Admission medications  Medication Sig Start Date End Date Taking? Authorizing Provider  acetaminophen  (TYLENOL ) 500 MG tablet Take 500 mg by mouth every 6 (six) hours as needed for mild pain (pain score 1-3).   Yes  [provider]  allopurinol  (ZYLOPRIM ) 100 MG tablet Take 100 mg by mouth daily. 05/25/22  Yes [provider]  apixaban  (ELIQUIS ) 5 MG TABS tablet Take 1 tablet (5 mg total) by mouth 2 (two) times daily. Okay to restart this medication on 08/29/2023 01/09/24  Yes Ricky Fines, MD  budesonide -glycopyrrolate -formoterol  (BREZTRI  AEROSPHERE) 160-9-4.8 MCG/ACT AERO inhaler Inhale 2 puffs into the lungs in the morning and at bedtime. 10/11/23  Yes Hattar, Zola SAILOR, MD  cholecalciferol  (VITAMIN D3) 25 MCG (1000 UNIT) tablet Take 1,000 Units by mouth daily.   Yes [provider]  cyanocobalamin  (VITAMIN B12) 100 MCG tablet Take 100 mcg by mouth daily.   Yes [provider]  folic acid  (FOLVITE ) 1 MG tablet TAKE 1 TABLET BY MOUTH EVERY DAY 12/07/23  Yes Rourk, Lamar HERO, MD  furosemide  (LASIX ) 20 MG tablet Take 10 mg by mouth daily.   Yes [provider]  ipratropium-albuterol  (DUONEB) 0.5-2.5 (3) MG/3ML SOLN Take 3 mLs by nebulization every 6 (six) hours as needed. 10/31/23  Yes  Hattar, Zola SAILOR, MD  loperamide  (IMODIUM  A-D) 2 MG tablet Take 2-4 mg by mouth 4 (four) times daily as needed for diarrhea or loose stools.   Yes [provider]  metoprolol  tartrate (LOPRESSOR ) 25 MG tablet Take 1 tablet (25 mg total) by mouth 2 (two) times daily. 11/17/21  Yes Strader, Brittany M, PA-C  pantoprazole  (PROTONIX ) 40 MG tablet Take 1 tablet (40 mg total) by mouth 2 (two) times daily before a meal. 10/17/23  Yes Rourk, Lamar HERO, MD  thiamine  (VITAMIN B-1) 100 MG tablet Take 100 mg by mouth daily.   Yes [provider]     Critical care time: 33 mins    The patient is critically ill with multiple organ system failure and requires high complexity decision making for assessment and support, frequent evaluation and titration of therapies, advanced monitoring, review of radiographic studies and interpretation of complex data.     Rolan Sharps MD Tinnie FORBES Furth, PA-C [facility] 01/17/2024 2:16 PM  Please see [email or web address] for pager details.  From 7A-7P if no response, please call [phone] After hours, please call ELink [phone]         [1] No Known Allergies

## 2024-01-17 NOTE — Progress Notes (Addendum)
 Progress Note   Patient: Raymond Hurst FMW:[id number] DOB: [birth year] DOA: 01/16/2024  DOS: the patient was seen and examined on 01/17/2024   Brief hospital course:  73 y.o. male with medical history significant of COPD, atrial fibrillation on Eliquis , OSA on CPAP, CVA, history of GI bleed due to gastric ulcer, chronic lymphedema, who was otherwise well until today when he started experiencing acute hematemesis.   Assessment and Plan:  Addendum:  Pt with upper GI bleed, blood loss anemia, and concern for only temporary hemostasis.  Recommendations to transfer patient to [facility] ICU for ongoing multidisciplinary management.  Reached out to Dr. Rolan Sharps, PCCM who have accepted patient and awaiting bed availability.  Patient is currently intubated due to airway protection and possible repeat procedure.  Concern for acute upper GI bleed - Known history of gastric ulcer, discharged 12/8 with recent diagnosis of bleeding gastric ulcer.  Currently on PPI twice daily, NPO.  GI consulted and following closely.  Scheduled for EGD later this morning.  Concern for ongoing bleeding given drop in hemoglobin.  Patient not complaining of any hematemesis.  Acute blood loss anemia - S/p 1 units PRBCs.  Hemoglobin down to 7.0 this morning.  Will order 2 units PRBCs.  Trend CBC every 4-6 hours.  Atrial fibrillation - Holding Eliquis  due to above.  Kcentra  given in the ED.  Currently rate controlled.  Will resume rate control medications once cleared for p.o.  Chronic lymphedema - Noted chronic appearing lipodermatosclerosis in BL LE.  Stockings at night.  As needed diuretic can be considered.  OSA on CPAP - CPAP at night.  Left-sided non-small cell lung cancer/malignant pleural effusion - Followed by oncology in the outpatient.  Receiving adjuvant Durvalumab  infusions outpatient status, last one 01/12/2024.  Subjective: Patient evaluated this morning, pleasant, denies any fever, chills, chest pain,  shortness of breath, nausea, vomiting  No hematemesis.  Mild upper abdominal pain.  Scheduled for EGD later this morning.  Physical Exam:  Vitals:   01/17/24 0845 01/17/24 0900 01/17/24 0907 01/17/24 0958  BP: 107/61  (!) 108/57 101/61  Pulse:  (!) 104 (!) 103 92  Resp: (!) 25 (!) 25 20 (!) 23  Temp: 98.1 F (36.7 C)  98.2 F (36.8 C) 98.5 F (36.9 C)  TempSrc: Oral  Oral   SpO2: 99% 98% 97% 100%  Weight:      Height:        GENERAL:  Alert, pleasant, no acute distress, pale HEENT:  EOMI CARDIOVASCULAR:  RRR, no murmurs appreciated RESPIRATORY:  Clear to auscultation, no wheezing, rales, or rhonchi GASTROINTESTINAL:  Soft, nontender, nondistended EXTREMITIES: BL LE chronic lymphedema NEURO:  No new focal deficits appreciated SKIN: Pale, BL LE lipodermatosclerosis PSYCH:  Appropriate mood and affect     Data Reviewed:  Imaging Studies: CT ANGIO GI BLEED Result Date: 01/06/2024 EXAM: CTA ABDOMEN AND PELVIS WITH CONTRAST 01/06/2024 09:58:06 PM TECHNIQUE: CTA images of the abdomen and pelvis with intravenous contrast. 100 mL iohexol  (OMNIPAQUE ) 350 MG/ML injection. Three-dimensional MIP/volume rendered formations were performed. Automated exposure control, iterative reconstruction, and/or weight based adjustment of the mA/kV was utilized to reduce the radiation dose to as low as reasonably achievable. COMPARISON: Chest CT 08/26/2023. CLINICAL HISTORY: Melena. FINDINGS: VASCULATURE: GI BLEED: No active extravasation of contrast within the GI tract. AORTA: Coronary artery and aortic atherosclerosis. No acute finding. No abdominal aortic aneurysm. No dissection. CELIAC TRUNK: No acute finding. No occlusion or significant stenosis. SUPERIOR MESENTRIC ARTERY: No acute finding. No occlusion  or significant stenosis. INFERIOR MESENTERIC ARTERY: No acute finding. No occlusion or significant stenosis. RENAL ARTERIES: No acute finding. No occlusion or significant stenosis. ILIAC ARTERIES: No  acute finding. No occlusion or significant stenosis. ABDOMEN/PELVIS: LOWER CHEST: Moderate left pleural effusion, similar to prior chest CT. LIVER: The liver is unremarkable. GALLBLADDER AND BILE DUCTS: Gallbladder is unremarkable. No biliary ductal dilatation. SPLEEN: The spleen is unremarkable. PANCREAS: The pancreas is unremarkable. ADRENAL GLANDS: A fat-containing mass in the right adrenal gland measures 6.5 cm, stable since the prior study, compatible with myelolipoma. KIDNEYS, URETERS AND BLADDER: 5.4 cm mid pole right renal cyst. 6.6 cm left lower pole cyst. No stones in the kidneys or ureters. No hydronephrosis. No perinephric or periureteral stranding. Large diverticulum off the left side of the bladder. GI AND BOWEL: Stomach and duodenal sweep demonstrate no acute abnormality. Extensive colonic diverticulosis. No active diverticulitis. Normal appendix. There is no bowel obstruction. No abnormal bowel wall thickening or distension. REPRODUCTIVE: Extensive prostate calcifications. Prostatomegaly. PERITONEUM AND RETROPERITONEUM: No ascites or free air. LYMPH NODES: No lymphadenopathy. BONES AND SOFT TISSUES: No acute abnormality of the bones. No acute soft tissue abnormality. IMPRESSION: 1. No active GI bleeding. 2. Aortic atherosclerosis. 3. Extensive colonic diverticulosis without evidence of diverticulitis. 4. Moderate left pleural effusion, similar to prior chest CT. Electronically signed by: Franky Crease MD 01/06/2024 10:16 PM EST RP Workstation: [id number]    Results are pending, will review when available.  Previous records (including but not limited to H&P, progress notes, nursing notes, TOC management) were reviewed in assessment of this patient.  Labs: CBC: Recent Labs  Lab 01/12/24 1048 01/16/24 1623 01/16/24 1708 01/17/24 0441  WBC 6.4 6.8  --  4.8  NEUTROABS 5.2 5.4  --   --   HGB 10.2* 7.7* 8.2* 7.0*  HCT 32.5* 25.4* 24.0* 22.1*  MCV 83.5 87.3  --  86.7  PLT 225 257  --  191    Basic Metabolic Panel: Recent Labs  Lab 01/12/24 1048 01/16/24 1623 01/16/24 1708 01/17/24 0441  NA 138 137 137 140  K 4.0 3.6 3.6 4.1  CL 104 103 104 108  CO2 25 19*  --  22  GLUCOSE 118* 178* 172* 87  BUN 14 28* 27* 31*  CREATININE 0.91 0.79 0.90 0.66  CALCIUM  9.4 8.4*  --  8.1*   Liver Function Tests: Recent Labs  Lab 01/12/24 1048 01/16/24 1623  AST 14* 13*  ALT 5 6  ALKPHOS 66 49  BILITOT 0.5 0.6  PROT 6.7 5.7*  ALBUMIN 3.6 3.5   CBG: No results for input(s): GLUCAP in the last 168 hours.  Scheduled Meds:  [MAR Hold] sodium chloride    Intravenous Once   [MAR Hold] sodium chloride    Intravenous Once   [MAR Hold] Chlorhexidine  Gluconate Cloth  6 each Topical Daily   [MAR Hold] furosemide   20 mg Intravenous Once   [MAR Hold] furosemide   20 mg Intravenous Once   [MAR Hold] pantoprazole  (PROTONIX ) IV  40 mg Intravenous Q12H   Continuous Infusions:  sodium chloride      PRN Meds:.[MAR Hold] ipratropium-albuterol , [MAR Hold] ondansetron  (ZOFRAN ) IV, [MAR Hold] mouth rinse  Family Communication: None at bedside  Disposition: Status is: Observation The patient will require care spanning > 2 midnights and should be moved to inpatient because: Acute blood loss anemia, GI bleed     Time spent: 40 minutes  Length of inpatient stay: 0 days  Author: Carliss LELON Canales, DO 01/17/2024 11:01 AM  For on  call review [email or web address].

## 2024-01-17 NOTE — Plan of Care (Signed)

## 2024-01-17 NOTE — Procedures (Signed)
 Procedure Note  01/17/2024    Preoperative Diagnosis: Hypotension, upper gastrointestinal bleed    Postoperative Diagnosis: Same   Procedure(s) Performed: Central Line placement, right internal jugular    Surgeon: Manuelita BROCKS. Kallie, MD   Assistants: None   Anesthesia: 1% lidocaine     Complications: None    Indications: Raymond Hurst is a 73 y.o. with an upper GI bleed and hypotension who needs a central line placed for resuscitation and medication. I tried to call his wife but got her voicemail and asked her to  call us  back. Dr. Arlon and I agreed this was an emergent central line and opted for me to place it.   Procedure: The patient placed supine. The right chest and neck was prepped and draped in the usual sterile fashion.  Wearing full gown and gloves, I performed the procedure.  One percent lidocaine  was used for local anesthesia. An ultrasound was utilized to assess the jugular vein.  The needle with syringe was advanced into the vein with dark venous return, and a wire was placed using the Seldinger technique without difficulty.  Ectopia was not noted.  The skin was knicked and a dilator was placed, and the three lumen catheter was placed over the wire with continued control of the wire.  There was good draw back of blood from all three lumens and each flushed easily with saline.  The catheter was secured in 4 points with 2-0 silk and a biopatch and dressing was placed.     The patient tolerated the procedure well, and the CXR was ordered to confirm position of the central line.   Manuelita Kallie, MD [facility] [address] [place] [address], [place] [address] [phone] (office)

## 2024-01-17 NOTE — Progress Notes (Signed)
 01/17/2024 Near code event while placing additional access with wide complex tachycardia. Bicarb/ calcium / epi pushed. Ongoing MTP Patient awake, giving additional sedation to help him through this.  Rolan Sharps MD PCCM

## 2024-01-17 NOTE — TOC CM/SW Note (Signed)
 Transition of Care [facility]) - Inpatient Brief Assessment   Patient Details  Name: Raymond Hurst MRN: [id number] Date of Birth: [birth year]  Transition of Care [facility]) CM/SW Contact:    Noreen KATHEE Raymond Hurst Phone Number: 01/17/2024, 8:43 AM   Clinical Narrative:  Inpatient Care Management (ICM) has reviewed patient and no other ICM needs have been identified at this time. We will continue to monitor patient advancement through interdisciplinary progression rounds. If new patient transition needs arise, please place a ICM consult.  Transition of Care Asessment: Insurance and Status: Insurance coverage has been reviewed Patient has primary care physician: Yes Home environment has been reviewed: From Home Prior level of function:: Independent Prior/Current Home Services: No current home services Social Drivers of Health Review: SDOH reviewed no interventions necessary Readmission risk has been reviewed: Yes Transition of care needs: no transition of care needs at this time

## 2024-01-17 NOTE — Anesthesia Preprocedure Evaluation (Addendum)
 Anesthesia Evaluation  Patient identified by MRN, date of birth, ID band Patient unresponsive    Reviewed: Allergy & Precautions, NPO status , Patient's Chart, lab work & pertinent test results, Unable to perform ROS - Chart review onlyPreop documentation limited or incomplete due to emergent nature of procedure.  Airway Mallampati: Intubated  TM Distance: >3 FB Neck ROM: Full    Dental  (+) Teeth Intact   Pulmonary sleep apnea , COPD, former smoker Stage 4 lung ca    breath sounds clear to auscultation   + intubated    Cardiovascular + dysrhythmias Atrial Fibrillation  Rhythm:Regular Rate:Tachycardia     Neuro/Psych TIACVA    GI/Hepatic ,,,(+)     substance abuse  [facility]   Endo/Other   history of adrenal tumor  Renal/GU      Musculoskeletal   Abdominal   Peds  Hematology   Anesthesia Other Findings   Reproductive/Obstetrics                              Anesthesia Physical Anesthesia Plan  ASA: 5 and emergent  Anesthesia Plan: General   Post-op Pain Management:    Induction: Inhalational  PONV Risk Score and Plan: 2 and Treatment may vary due to age or medical condition  Airway Management Planned: Oral ETT  Additional Equipment: Arterial line and CVP  Intra-op Plan:   Post-operative Plan: Post-operative intubation/ventilation  Informed Consent:      History available from chart only and Only emergency history available  Plan Discussed with: CRNA  Anesthesia Plan Comments:          Anesthesia Quick Evaluation

## 2024-01-17 NOTE — Progress Notes (Signed)
 1634 Received pt from [facility], BP soft. Responds to voice/pain . PT had just completed 2u PRBC en route with [facility]. MD's at bedside initiated MTP.  See other noted for all meds given

## 2024-01-17 NOTE — Consult Note (Signed)
 Reason for Consult: GI bleed Referring Physician: Dr. Dr. Claudene Marinda JONELLE Raymond Hurst is an 73 y.o. male.  HPI: Patient is a 73 year old male with a history of adrenal tumor, A-fib on Eliquis , COPD, pulmonary mass, who was transferred from [facility] for GI bleed.  Patient at the hospital and had a upper endoscopy with clipping of a vessel at the duodenal bulb.  Patient also had a IR embolization of the GDA.  Patient was transferred down here secondary to ongoing bleeding.  Patient presents at the ICU with ongoing bleeding.  Patient remains hypotensive.  He is currently undergoing MTP.  Patient currently on multiple pressors.  I discussed the situation with the patient's wife with the patient's current findings.  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  He had nocturnal hypoxemia with O2 sat nadir of 70%. Now on CPAP   Pulmonary mass    Stroke [facility])     Past Surgical History:  Procedure Laterality Date   BRONCHIAL BIOPSY  06/12/2023   Procedure: BRONCHOSCOPY, WITH BIOPSY;  Surgeon: Shelah Lamar RAMAN, MD;  Location: [facility] ENDOSCOPY;  Service: Cardiopulmonary;;   BRONCHIAL BRUSHINGS  06/12/2023   Procedure: BRONCHOSCOPY, WITH BRUSH BIOPSY;  Surgeon: Shelah Lamar RAMAN, MD;  Location: [facility] ENDOSCOPY;  Service: Cardiopulmonary;;   BRONCHIAL NEEDLE ASPIRATION BIOPSY  06/12/2023   Procedure: BRONCHOSCOPY, WITH NEEDLE ASPIRATION BIOPSY;  Surgeon: Shelah Lamar RAMAN, MD;  Location: [facility] ENDOSCOPY;  Service: Cardiopulmonary;;   BRONCHIAL NEEDLE ASPIRATION BIOPSY  08/28/2023    Procedure: BRONCHOSCOPY, WITH NEEDLE ASPIRATION BIOPSY;  Surgeon: Shelah Lamar RAMAN, MD;  Location: [facility] ENDOSCOPY;  Service: Pulmonary;;   CRYOTHERAPY  08/28/2023   Procedure: CRYOTHERAPY;  Surgeon: Shelah Lamar RAMAN, MD;  Location: [facility] ENDOSCOPY;  Service: Pulmonary;;   ESOPHAGOGASTRODUODENOSCOPY N/A 09/09/2023   Procedure: EGD (ESOPHAGOGASTRODUODENOSCOPY);  Surgeon: Shaaron Lamar HERO, MD;  Location: [facility] ENDO SUITE;  Service: Endoscopy;  Laterality: N/A;   ESOPHAGOGASTRODUODENOSCOPY N/A 01/08/2024   Procedure: EGD (ESOPHAGOGASTRODUODENOSCOPY);  Surgeon: Cindie Carlin POUR, DO;  Location: [facility] ENDO SUITE;  Service: Endoscopy;  Laterality: N/A;   HEMOSTASIS CLIP PLACEMENT  06/12/2023   Procedure: CONTROL OF HEMORRHAGE bronch cold saline and epi;  Surgeon: Shelah Lamar RAMAN, MD;  Location: [facility] ENDOSCOPY;  Service: Cardiopulmonary;;   INNER EAR SURGERY     IR ANGIOGRAM SELECTIVE EACH ADDITIONAL VESSEL  09/09/2023   IR ANGIOGRAM VISCERAL SELECTIVE  09/09/2023   IR ANGIOGRAM VISCERAL SELECTIVE  09/09/2023   IR EMBO ART  VEN HEMORR LYMPH EXTRAV  INC GUIDE ROADMAPPING  09/09/2023   IR THORACENTESIS ASP PLEURAL SPACE W/IMG GUIDE  09/26/2023   IR US  GUIDE VASC ACCESS RIGHT  09/09/2023   KNEE SURGERY Left    THORACENTESIS Left 06/06/2023   Procedure: THORACENTESIS;  Surgeon: Arlinda Ranks, MD;  Location: [facility] ENDOSCOPY;  Service: Pulmonary;  Laterality: Left;   TONSILLECTOMY     VIDEO BRONCHOSCOPY WITH ENDOBRONCHIAL NAVIGATION Left 08/28/2023  Procedure: VIDEO BRONCHOSCOPY WITH ENDOBRONCHIAL NAVIGATION;  Surgeon: Shelah Lamar RAMAN, MD;  Location: [facility] ENDOSCOPY;  Service: Pulmonary;  Laterality: Left;   VIDEO BRONCHOSCOPY WITH ENDOBRONCHIAL ULTRASOUND Left 06/12/2023   Procedure: BRONCHOSCOPY, WITH EBUS;  Surgeon: Shelah Lamar RAMAN, MD;  Location: [facility] ENDOSCOPY;  Service: Cardiopulmonary;  Laterality: Left;   VIDEO BRONCHOSCOPY WITH ENDOBRONCHIAL ULTRASOUND  08/28/2023   Procedure: BRONCHOSCOPY, WITH EBUS;  Surgeon: Shelah Lamar RAMAN, MD;   Location: [facility] ENDOSCOPY;  Service: Pulmonary;;    Family History  Problem Relation Age of Onset   Heart failure Mother    COPD Mother    Atrial fibrillation Mother    Idiopathic pulmonary fibrosis Father     Social History:  reports that he quit smoking about 33 years ago. His smoking use included cigarettes. He started smoking about 53 years ago. He has a 40 pack-year smoking history. He has never used smokeless tobacco. He reports that he does not currently use alcohol. He reports that he does not use drugs.  Allergies: Allergies[1]  Medications: I have reviewed the patient's current medications.  Results for orders placed or performed during the hospital encounter of 01/16/24 (from the past 48 hours)  CBC with Differential     Status: Abnormal   Collection Time: 01/16/24  4:23 PM  Result Value Ref Range   WBC 6.8 4.0 - 10.5 K/uL   RBC 2.91 (L) 4.22 - 5.81 MIL/uL   Hemoglobin 7.7 (L) 13.0 - 17.0 g/dL   HCT 74.5 (L) 60.9 - 47.9 %   MCV 87.3 80.0 - 100.0 fL   MCH 26.5 26.0 - 34.0 pg   MCHC 30.3 30.0 - 36.0 g/dL   RDW 83.2 (H) 88.4 - 84.4 %   Platelets 257 150 - 400 K/uL   nRBC 0.0 0.0 - 0.2 %   Neutrophils Relative % 80 %   Neutro Abs 5.4 1.7 - 7.7 K/uL   Lymphocytes Relative 10 %   Lymphs Abs 0.7 0.7 - 4.0 K/uL   Monocytes Relative 7 %   Monocytes Absolute 0.5 0.1 - 1.0 K/uL   Eosinophils Relative 2 %   Eosinophils Absolute 0.1 0.0 - 0.5 K/uL   Basophils Relative 1 %   Basophils Absolute 0.1 0.0 - 0.1 K/uL   Immature Granulocytes 0 %   Abs Immature Granulocytes 0.03 0.00 - 0.07 K/uL    Comment: Performed at [facility], [address], [place], [place] [address]  Comprehensive metabolic panel     Status: Abnormal   Collection Time: 01/16/24  4:23 PM  Result Value Ref Range   Sodium 137 135 - 145 mmol/L   Potassium 3.6 3.5 - 5.1 mmol/L   Chloride 103 98 - 111 mmol/L   CO2 19 (L) 22 - 32 mmol/L   Glucose, Bld 178 (H) 70 - 99 mg/dL    Comment: Glucose reference range  applies only to samples taken after fasting for at least 8 hours.   BUN 28 (H) 8 - 23 mg/dL   Creatinine, Ser 9.20 0.61 - 1.24 mg/dL   Calcium  8.4 (L) 8.9 - 10.3 mg/dL   Total Protein 5.7 (L) 6.5 - 8.1 g/dL   Albumin 3.5 3.5 - 5.0 g/dL   AST 13 (L) 15 - 41 U/L   ALT 6 0 - 44 U/L   Alkaline Phosphatase 49 38 - 126 U/L   Total Bilirubin 0.6 0.0 - 1.2 mg/dL   GFR, Estimated >39 >39 mL/min    Comment: (NOTE) Calculated using the CKD-EPI  Creatinine Equation (2021)    Anion gap 15 5 - 15    Comment: Performed at [facility], [address] [place], [place] [address]  Type and screen     Status: None (Preliminary result)   Collection Time: 01/16/24  4:35 PM  Result Value Ref Range   ABO/RH(D) O NEG    Antibody Screen NEG    Sample Expiration      01/19/2024,2359 Performed at [facility], [address], [place], [place] [address]    Unit Number [id number]    Blood Component Type RED CELLS,LR    Unit division 00    Status of Unit ISSUED    Transfusion Status OK TO TRANSFUSE    Crossmatch Result Compatible    Unit Number [id number]    Blood Component Type RED CELLS,LR    Unit division 00    Status of Unit ISSUED,FINAL    Transfusion Status OK TO TRANSFUSE    Crossmatch Result Compatible    Unit Number [id number]    Blood Component Type RED CELLS,LR    Unit division 00    Status of Unit ISSUED    Transfusion Status OK TO TRANSFUSE    Crossmatch Result Compatible    Unit Number [id number]    Blood Component Type RED CELLS,LR    Unit division 00    Status of Unit ISSUED    Transfusion Status OK TO TRANSFUSE    Crossmatch Result Compatible    Unit Number [id number]    Blood Component Type RED CELLS,LR    Unit division 00    Status of Unit ISSUED    Transfusion Status OK TO TRANSFUSE    Crossmatch Result Compatible    Unit Number [id number]    Blood Component Type RED CELLS,LR    Unit division 00    Status of Unit ALLOCATED    Transfusion Status OK TO  TRANSFUSE    Crossmatch Result Compatible    Unit Number [id number]    Blood Component Type RED CELLS,LR    Unit division 00    Status of Unit ALLOCATED    Transfusion Status OK TO TRANSFUSE    Crossmatch Result Compatible   Prepare RBC (crossmatch)     Status: None   Collection Time: 01/16/24  4:35 PM  Result Value Ref Range   Order Confirmation      ORDER PROCESSED BY BLOOD BANK Performed at [facility], [address] [place], [place] [address]   Prepare RBC (crossmatch)     Status: None   Collection Time: 01/16/24  5:04 PM  Result Value Ref Range   Order Confirmation      ORDER PROCESSED BY BLOOD BANK Performed at [facility], [address], [place], [place] [address]   I-stat chem 8, ED (not at [facility], [facility] or [facility])     Status: Abnormal   Collection Time: 01/16/24  5:08 PM  Result Value Ref Range   Sodium 137 135 - 145 mmol/L   Potassium 3.6 3.5 - 5.1 mmol/L   Chloride 104 98 - 111 mmol/L   BUN 27 (H) 8 - 23 mg/dL   Creatinine, Ser 9.09 0.61 - 1.24 mg/dL   Glucose, Bld 827 (H) 70 - 99 mg/dL    Comment: Glucose reference range applies only to samples taken after fasting for at least 8 hours.   Calcium , Ion 1.07 (L) 1.15 - 1.40 mmol/L   TCO2 20 (L) 22 - 32 mmol/L   Hemoglobin 8.2 (L) 13.0 - 17.0  g/dL   HCT 75.9 (L) 60.9 - 47.9 %  MRSA Next Gen by PCR, Nasal     Status: None   Collection Time: 01/16/24  8:47 PM   Specimen: Nasal Mucosa; Nasal Swab  Result Value Ref Range   MRSA by PCR Next Gen NOT DETECTED NOT DETECTED    Comment: (NOTE) The GeneXpert MRSA Assay (FDA approved for NASAL specimens only), is one component of a comprehensive MRSA colonization surveillance program. It is not intended to diagnose MRSA infection nor to guide or monitor treatment for MRSA infections. Test performance is not FDA approved in patients less than 35 years old. Performed at [facility], [address] [place], [place] [address]   Basic metabolic panel     Status: Abnormal    Collection Time: 01/17/24  4:41 AM  Result Value Ref Range   Sodium 140 135 - 145 mmol/L   Potassium 4.1 3.5 - 5.1 mmol/L   Chloride 108 98 - 111 mmol/L   CO2 22 22 - 32 mmol/L   Glucose, Bld 87 70 - 99 mg/dL    Comment: Glucose reference range applies only to samples taken after fasting for at least 8 hours.   BUN 31 (H) 8 - 23 mg/dL   Creatinine, Ser 9.33 0.61 - 1.24 mg/dL   Calcium  8.1 (L) 8.9 - 10.3 mg/dL   GFR, Estimated >39 >39 mL/min    Comment: (NOTE) Calculated using the CKD-EPI Creatinine Equation (2021)    Anion gap 10 5 - 15    Comment: Performed at [facility], [address], [place], [place] [address]  CBC     Status: Abnormal   Collection Time: 01/17/24  4:41 AM  Result Value Ref Range   WBC 4.8 4.0 - 10.5 K/uL   RBC 2.55 (L) 4.22 - 5.81 MIL/uL   Hemoglobin 7.0 (L) 13.0 - 17.0 g/dL   HCT 77.8 (L) 60.9 - 47.9 %   MCV 86.7 80.0 - 100.0 fL   MCH 27.5 26.0 - 34.0 pg   MCHC 31.7 30.0 - 36.0 g/dL   RDW 83.5 (H) 88.4 - 84.4 %   Platelets 191 150 - 400 K/uL   nRBC 0.0 0.0 - 0.2 %    Comment: Performed at [facility], [address], [place], [place] [address]  Blood gas, arterial     Status: Abnormal   Collection Time: 01/17/24 12:35 PM  Result Value Ref Range   pH, Arterial 7.36 7.35 - 7.45   pCO2 arterial 42 32 - 48 mmHg   pO2, Arterial 206 (H) 83 - 108 mmHg   Bicarbonate 23.7 20.0 - 28.0 mmol/L   Acid-base deficit 1.8 0.0 - 2.0 mmol/L   O2 Saturation 100 %   Patient temperature 36.9    Collection site RIGHT RADIAL    Drawn by [id number]    Allens test (pass/fail) PASS PASS    Comment: Performed at [facility], [address], [place], [place] [address]  Hemoglobin and hematocrit, blood     Status: Abnormal   Collection Time: 01/17/24  2:07 PM  Result Value Ref Range   Hemoglobin 6.8 (LL) 13.0 - 17.0 g/dL    Comment: REPEATED TO VERIFY This critical result has been called to O. PUCKETT by [facility] Adger on 01/17/2024 14:41:56, and has been read back.    HCT  20.9 (L) 39.0 - 52.0 %    Comment: Performed at [facility], [address], [place], [place] [address]  Prepare RBC (crossmatch)  Status: None   Collection Time: 01/17/24  2:50 PM  Result Value Ref Range   Order Confirmation      ORDER PROCESSED BY BLOOD BANK Performed at [facility], [address] [place], [place] [address]   Type and screen [facility]     Status: None (Preliminary result)   Collection Time: 01/17/24  4:33 PM  Result Value Ref Range   ABO/RH(D) PENDING    Antibody Screen PENDING    Sample Expiration 01/20/2024,2359    Unit Number [id number]    Blood Component Type RED CELLS,LR    Unit division 00    Status of Unit ISSUED    Unit tag comment EMERGENCY RELEASE    Transfusion Status OK TO TRANSFUSE    Crossmatch Result PENDING    Unit Number [id number]    Blood Component Type RED CELLS,LR    Unit division 00    Status of Unit ISSUED    Unit tag comment EMERGENCY RELEASE    Transfusion Status OK TO TRANSFUSE    Crossmatch Result PENDING    Unit Number [id number]    Blood Component Type RED CELLS,LR    Unit division 00    Status of Unit ISSUED    Unit tag comment EMERGENCY RELEASE    Transfusion Status OK TO TRANSFUSE    Crossmatch Result PENDING    Unit Number [id number]    Blood Component Type RED CELLS,LR    Unit division 00    Status of Unit ISSUED    Unit tag comment EMERGENCY RELEASE    Transfusion Status OK TO TRANSFUSE    Crossmatch Result PENDING    Unit Number [id number]    Blood Component Type RED CELLS,LR    Unit division 00    Status of Unit ISSUED    Unit tag comment EMERGENCY RELEASE    Transfusion Status OK TO TRANSFUSE    Crossmatch Result PENDING    Unit Number [id number]    Blood Component Type RED CELLS,LR    Unit division 00    Status of Unit ISSUED    Unit tag comment EMERGENCY RELEASE    Transfusion Status OK TO TRANSFUSE    Crossmatch Result PENDING    Unit Number [id number]     Blood Component Type RED CELLS,LR    Unit division 00    Status of Unit ISSUED    Unit tag comment EMERGENCY RELEASE    Transfusion Status OK TO TRANSFUSE    Crossmatch Result PENDING    Unit Number [id number]    Blood Component Type RED CELLS,LR    Unit division 00    Status of Unit ISSUED    Unit tag comment EMERGENCY RELEASE    Transfusion Status OK TO TRANSFUSE    Crossmatch Result PENDING    Unit Number [id number]    Blood Component Type RED CELLS,LR    Unit division 00    Status of Unit ISSUED    Unit tag comment EMERGENCY RELEASE    Transfusion Status OK TO TRANSFUSE    Crossmatch Result PENDING    Unit Number [id number]    Blood Component Type RED CELLS,LR    Unit division 00    Status of Unit ISSUED    Unit tag comment EMERGENCY RELEASE    Transfusion Status OK TO TRANSFUSE    Crossmatch Result PENDING    Unit Number [id number]    Blood Component Type RBC LR PHER1    Unit division 00  Status of Unit ISSUED    Unit tag comment EMERGENCY RELEASE    Transfusion Status OK TO TRANSFUSE    Crossmatch Result PENDING    Unit Number [id number]    Blood Component Type RED CELLS,LR    Unit division 00    Status of Unit ISSUED    Unit tag comment EMERGENCY RELEASE    Transfusion Status OK TO TRANSFUSE    Crossmatch Result PENDING    Unit Number [id number]    Blood Component Type RED CELLS,LR    Unit division 00    Status of Unit ISSUED    Unit tag comment EMERGENCY RELEASE    Transfusion Status OK TO TRANSFUSE    Crossmatch Result PENDING    Unit Number [id number]    Blood Component Type RED CELLS,LR    Unit division 00    Status of Unit ISSUED    Unit tag comment EMERGENCY RELEASE    Transfusion Status OK TO TRANSFUSE    Crossmatch Result PENDING    Unit Number [id number]    Blood Component Type RBC LR PHER2    Unit division 00    Status of Unit ISSUED    Unit tag comment EMERGENCY RELEASE    Transfusion Status OK TO TRANSFUSE     Crossmatch Result PENDING    Unit Number [id number]    Blood Component Type RED CELLS,LR    Unit division 00    Status of Unit ISSUED    Unit tag comment EMERGENCY RELEASE    Transfusion Status OK TO TRANSFUSE    Crossmatch Result PENDING    Unit Number [id number]    Blood Component Type RED CELLS,LR    Unit division 00    Status of Unit ISSUED    Unit tag comment EMERGENCY RELEASE    Transfusion Status OK TO TRANSFUSE    Crossmatch Result PENDING    Unit Number [id number]    Blood Component Type RBC LR PHER2    Unit division 00    Status of Unit ISSUED    Unit tag comment EMERGENCY RELEASE    Transfusion Status OK TO TRANSFUSE    Crossmatch Result PENDING    Unit Number [id number]    Blood Component Type RED CELLS,LR    Unit division 00    Status of Unit ISSUED    Unit tag comment EMERGENCY RELEASE    Transfusion Status OK TO TRANSFUSE    Crossmatch Result PENDING    Unit Number [id number]    Blood Component Type RED CELLS,LR    Unit division 00    Status of Unit ISSUED    Unit tag comment EMERGENCY RELEASE    Transfusion Status OK TO TRANSFUSE    Crossmatch Result PENDING    Unit Number [id number]    Blood Component Type RBC LR PHER1    Unit division 00    Status of Unit ISSUED    Unit tag comment EMERGENCY RELEASE    Transfusion Status OK TO TRANSFUSE    Crossmatch Result PENDING    Unit Number [id number]    Blood Component Type RED CELLS,LR    Unit division 00    Status of Unit ISSUED    Unit tag comment EMERGENCY RELEASE    Transfusion Status OK TO TRANSFUSE    Crossmatch Result PENDING    Unit Number [id number]    Blood Component Type RBC LR PHER2    Unit division 00    Status of Unit ISSUED  Unit tag comment EMERGENCY RELEASE    Transfusion Status OK TO TRANSFUSE    Crossmatch Result PENDING    Unit Number [id number]    Blood Component Type RED CELLS,LR    Unit division 00    Status of Unit ISSUED    Unit tag comment  EMERGENCY RELEASE    Transfusion Status      OK TO TRANSFUSE Performed at [facility], [address] [place], [place] [address]    Crossmatch Result PENDING    Unit Number [id number]    Blood Component Type RED CELLS,LR    Unit division 00    Status of Unit ISSUED    Unit tag comment EMERGENCY RELEASE    Transfusion Status OK TO TRANSFUSE    Crossmatch Result PENDING    Unit Number [id number]    Blood Component Type RED CELLS,LR    Unit division 00    Status of Unit ISSUED    Unit tag comment EMERGENCY RELEASE    Transfusion Status OK TO TRANSFUSE    Crossmatch Result PENDING    Unit Number [id number]    Blood Component Type RED CELLS,LR    Unit division 00    Status of Unit ISSUED    Unit tag comment EMERGENCY RELEASE    Transfusion Status OK TO TRANSFUSE    Crossmatch Result PENDING    Unit Number [id number]    Blood Component Type RBC LR PHER1    Unit division 00    Status of Unit ISSUED    Unit tag comment EMERGENCY RELEASE    Transfusion Status OK TO TRANSFUSE    Crossmatch Result PENDING   Prepare fresh frozen plasma     Status: None (Preliminary result)   Collection Time: 01/17/24  4:49 PM  Result Value Ref Range   Unit Number [id number]    Blood Component Type LIQ PLASMA    Unit division 00    Status of Unit ISSUED    Unit tag comment EMERGENCY RELEASE    Transfusion Status OK TO TRANSFUSE    Unit Number [id number]    Blood Component Type LIQ PLASMA    Unit division 00    Status of Unit ISSUED    Unit tag comment EMERGENCY RELEASE    Transfusion Status OK TO TRANSFUSE    Unit Number [id number]    Blood Component Type THW PLS APHR    Unit division 00    Status of Unit ISSUED    Unit tag comment EMERGENCY RELEASE    Transfusion Status OK TO TRANSFUSE    Unit Number [id number]    Blood Component Type THW PLS APHR    Unit division A0    Status of Unit ISSUED    Unit tag comment EMERGENCY RELEASE    Transfusion Status OK  TO TRANSFUSE    Unit Number [id number]    Blood Component Type LIQ PLASMA    Unit division 00    Status of Unit ISSUED    Unit tag comment EMERGENCY RELEASE    Transfusion Status OK TO TRANSFUSE    Unit Number [id number]    Blood Component Type LIQ PLASMA    Unit division 00    Status of Unit ISSUED    Unit tag comment EMERGENCY RELEASE    Transfusion Status OK TO TRANSFUSE    Unit Number [id number]    Blood Component Type LIQ PLASMA    Unit division 00    Status of Unit ISSUED  Unit tag comment EMERGENCY RELEASE    Transfusion Status OK TO TRANSFUSE    Unit Number [id number]    Blood Component Type THW PLS APHR    Unit division 00    Status of Unit ISSUED    Unit tag comment EMERGENCY RELEASE    Transfusion Status OK TO TRANSFUSE    Unit Number [id number]    Blood Component Type THW PLS APHR    Unit division B0    Status of Unit ISSUED    Unit tag comment EMERGENCY RELEASE    Transfusion Status OK TO TRANSFUSE    Unit Number [id number]    Blood Component Type LIQ PLASMA    Unit division 00    Status of Unit ISSUED    Unit tag comment EMERGENCY RELEASE    Transfusion Status OK TO TRANSFUSE    Unit Number [id number]    Blood Component Type THW PLS APHR    Unit division A0    Status of Unit ISSUED    Unit tag comment EMERGENCY RELEASE    Transfusion Status      OK TO TRANSFUSE Performed at [facility], [address], [place], [place] [address]    Unit Number [id number]    Blood Component Type THW PLS APHR    Unit division A0    Status of Unit ISSUED    Unit tag comment EMERGENCY RELEASE    Transfusion Status OK TO TRANSFUSE   Prepare cryoprecipitate     Status: None (Preliminary result)   Collection Time: 01/17/24  4:49 PM  Result Value Ref Range   Unit Number [id number]    Blood Component Type CRYO,THAWED    Unit division A0    Status of Unit ISSUED    Unit tag comment EMERGENCY RELEASE    Transfusion Status      OK TO  TRANSFUSE Performed at [facility], [address], [place], [place] [address]   Prepare platelet pheresis     Status: None (Preliminary result)   Collection Time: 01/17/24  4:49 PM  Result Value Ref Range   Unit Number [id number]    Blood Component Type PLTP3 PSORALEN TREATED    Unit division 00    Status of Unit ISSUED    Transfusion Status OK TO TRANSFUSE    Unit Number [id number]    Blood Component Type PLTP2 PSORALEN TREATED    Unit division 00    Status of Unit ISSUED    Unit tag comment EMERGENCY RELEASE    Transfusion Status      OK TO TRANSFUSE Performed at [facility], [address] [place], [place] [address]   I-STAT 7, (LYTES, BLD GAS, ICA, H+H)     Status: Abnormal   Collection Time: 01/17/24  5:55 PM  Result Value Ref Range   pH, Arterial 7.330 (L) 7.35 - 7.45   pCO2 arterial 41.9 32 - 48 mmHg   pO2, Arterial 264 (H) 83 - 108 mmHg   Bicarbonate 22.3 20.0 - 28.0 mmol/L   TCO2 24 22 - 32 mmol/L   O2 Saturation 100 %   Acid-base deficit 4.0 (H) 0.0 - 2.0 mmol/L   Sodium 143 135 - 145 mmol/L   Potassium 3.6 3.5 - 5.1 mmol/L   Calcium , Ion 0.69 (LL) 1.15 - 1.40 mmol/L   HCT 29.0 (L) 39.0 - 52.0 %   Hemoglobin 9.9 (L) 13.0 - 17.0 g/dL   Patient temperature 63.9 C    Sample  type ARTERIAL    Comment NOTIFIED PHYSICIAN     DG Chest Port 1 View Result Date: 01/17/2024 EXAM: 1 VIEW(S) XRAY OF THE CHEST 01/17/2024 03:06:00 PM COMPARISON: 01/17/2024 CLINICAL HISTORY: Post central line placement. FINDINGS: LINES, TUBES AND DEVICES: Endotracheal tube present with tip measuring 4.4 cm above the carina. Right central venous catheter with tip over the cavoatrial junction region. LUNGS AND PLEURA: Shallow inspiration. Probable bilateral pleural effusions with infiltration or atelectasis in the left lung base. Left basilar infiltrates are somewhat improved since previous study. No pneumothorax. HEART AND MEDIASTINUM: Cardiac enlargement. BONES AND SOFT  TISSUES: Degenerative changes in the spine. No acute osseous abnormality. IMPRESSION: 1. Right central venous catheter tip projects at the cavoatrial junction region, with no pneumothorax. 2. Endotracheal tube tip projects 4.4 cm above the carina. 3. Probable bilateral pleural effusions and left basilar infiltrates, somewhat improved since previous study. 4. Cardiac enlargement. Electronically signed by: Elsie Gravely MD 01/17/2024 03:25 PM EST RP Workstation: [id number]   DG CHEST PORT 1 VIEW Result Date: 01/17/2024 EXAM: 1 VIEW(S) XRAY OF THE CHEST 01/17/2024 11:39:00 AM COMPARISON: 10/31/2023 CLINICAL HISTORY: Endotracheally intubated FINDINGS: LINES, TUBES AND DEVICES: Endotracheal tube in place, 3.9 cm above the carina. LUNGS AND PLEURA: Dense opacity in left mid to lower lung. Suspected left pleural effusion. Mild atelectasis within right lung. Mild pulmonary edema. No pneumothorax. HEART AND MEDIASTINUM: Cardiomegaly. BONES AND SOFT TISSUES: No acute osseous abnormality. IMPRESSION: 1. Dense opacity in left mid to lower lung, suspected left pleural effusion, and mild atelectasis within right lung. 2. Mild pulmonary edema. 3. Cardiomegaly. Electronically signed by: Evalene Coho MD 01/17/2024 12:21 PM EST RP Workstation: [id number]    Review of Systems  Unable to perform ROS: Acuity of condition   Blood pressure (!) 92/56, pulse 94, temperature 97.9 F (36.6 C), temperature source Axillary, resp. rate 18, height 6' (1.829 m), weight 119.7 kg, SpO2 100%. Physical Exam Constitutional:      Appearance: He is well-developed.     Comments: Conversant No acute distress  HENT:     Head: Normocephalic and atraumatic.  Eyes:     General: Lids are normal. No scleral icterus.    Pupils: Pupils are equal, round, and reactive to light.     Comments: Pupils are equal round and reactive No lid lag Moist conjunctiva  Neck:     Thyroid : No thyromegaly.     Trachea: No tracheal tenderness.      Comments: No cervical lymphadenopathy Cardiovascular:     Rate and Rhythm: Normal rate and regular rhythm.     Heart sounds: No murmur heard. Pulmonary:     Effort: Pulmonary effort is normal.     Breath sounds: Normal breath sounds. No wheezing or rales.  Abdominal:     General: There is distension.     Tenderness: There is no abdominal tenderness.     Hernia: No hernia is present.  Musculoskeletal:     Cervical back: Normal range of motion and neck supple.  Skin:    General: Skin is warm.     Findings: No rash.     Nails: There is no clubbing.     Comments: Normal skin turgor  Neurological:     Mental Status: He is unresponsive.     GCS: GCS eye subscore is 1. GCS verbal subscore is 1. GCS motor subscore is 1.  Psychiatric:     Comments: Appropriate affect     Assessment/Plan: 73 year old male with upper GI bleed. History of  adrenal tumor COPD Stage IV lung cancer A-fib-on Eliquis .  I had a long discussion with the patient's wife in regards to the current status.  The patient at this time likely has a massive GI bleed I do not feel that the patient would be able to make it to IR.  I discussed with the wife that there is a good likelihood he will not survive his current situation.  She would like us  to go to the operating room for a laparotomy.  I discussed that there is a high chance that he may not make it out of the operating room.  Will proceed to the operating for ex lap, duodenotomy and ligation of ulcer.  Lynda Leos 01/17/2024, 6:16 PM         [1] No Known Allergies

## 2024-01-17 NOTE — Op Note (Signed)
 01/17/2024  8:34 PM  PATIENT:  Raymond Hurst  73 y.o. male  PRE-OPERATIVE DIAGNOSIS:  duodenal ulcer hemorrhage  POST-OPERATIVE DIAGNOSIS:  duodenal ulcer hemorrhage  PROCEDURE:  Procedures: LAPAROTOMY, EXPLORATORY (N/A) Duodenotomy Ligation of GDA and branches ABThera wound VAC placement  SURGEON:  Surgeons and Role:    DEWAINE Rubin Calamity, MD - Primary    DEWAINE Vernetta Berg, MD - Assisting who was essential at open with retraction, visualization of the duodenal ulcer and ligation  ANESTHESIA:   general  EBL:  minimal   BLOOD ADMINISTERED:none  DRAINS: none   LOCAL MEDICATIONS USED:  NONE  SPECIMEN:  No Specimen  DISPOSITION OF SPECIMEN:  N/A  COUNTS:  NO as 1 laparotomy pad was intentionally left in place and the subhepatic fossa near the gallbladder  TOURNIQUET:  * No tourniquets in log *  DICTATION: .Dragon Dictation Indication procedure: Patient is a 73 year old male with a history of upper GI bleed.  Patient was found to have a localized gastroduodenal bleed from a gastric ulcer.  This attempted to be embolized by IR and clipped by GI via endoscopy.  Patient had continued bleeding.  Patient was taken back to the operating room emergently for laparotomy.  Findings: Patient with a large amount of clotted blood within the stomach.  The previous ulcer was visualized via duodenotomy.  This was ligated around the edge of the ulcer.  The duodenum was incised in a linear fashion and closed in transverse fashion.  A laparotomy pad was placed in the subhepatic fossa near the neck of the gallbladder.  This was left in place intentionally to help with hemostasis.  Everest and snow were placed into this same area.  A Graham patch was not placed over the closure.  Details of procedure: After the patient was consented patient was taken back to the OR and placed supine position with bilateral SCDs in place.  Patient was placed under general anesthesia.  He was previously  intubated.  At this time a timeout was called and all facts verified.  An upper midline incision is made using a #10 blade.  Dissection is taken down the linea alba.  This was incised.  The peritoneum was entered bluntly.  There is large amount of ascites within the abdominal cavity.  This was suctioned out.  At this time the stomach appeared to be very full.  NG tube suction was on suction however was minimal output.  At this time we mobilized the stomach and the pylorus.  We able to visualize the duodenum however there were some omental adhesions in the retroperitoneum.  Upon trying to finger fracture these adhesions there was some bleeding from the area.  We suture-ligated these areas of bleeding.  At this time we open the duodenum from a distal to proximal fashion.  Were able to visualize the previous GIA clip.  This was the ulcerated space.  This seemed to be very inflamed.  The clip was removed.  Coils were seen at the base of the ulcer as well.  At this time 2 oh silks were used to ligate the gastroduodenal artery and its branches.  There was bowel still seen refluxing back into the duodenum.  There was no active ongoing bleeding seen coming from the ulcer base.  The edges of the duodenum was bleeding these were cauterized.  At this time we decided to close the duodenotomy.  We did this in a transverse fashion using interrupted 2 oh silks.  At this time we  revisualize the area of concern that was deep to the gallbladder towards the neck.  There appeared to be hemostatic however we did leave a laparotomy pad x 1 to help with hemostasis.  At this time an ABThera wound VAC was placed into the abdominal cavity.  This was secured to the skin.  Suction was started.  There was no leak.  Patient was taken to the ICU intubated and in critical condition.    PLAN OF CARE: Admit to inpatient   PATIENT DISPOSITION:  ICU - intubated and critically ill.   Delay start of Pharmacological VTE Hurst (>24hrs) due to  surgical blood loss or risk of bleeding: yes

## 2024-01-17 NOTE — Progress Notes (Signed)
 01/17/2024  Multiple rounds epi/bicarb/calcium  in addition to MTP.  Now with fairly obvious TACO and likely transfusion reaction (hives then diffuse redness) . Remains on multiple pressors.  Going to OR to attempt ex lap but prognosis grim at best, appreciate Dr. Rubin help.  Family at bedside and understands grim prognosis.  Will update CCM team to f/u if survive OR.  ~120 min cc time between Drs. Stretch and myself.  Rolan Sharps MD PCCM

## 2024-01-17 NOTE — Hospital Course (Signed)
 73 y.o. male with medical history significant of COPD, atrial fibrillation on Eliquis , OSA on CPAP, CVA, history of GI bleed due to gastric ulcer, chronic lymphedema, who was otherwise well until today when he started experiencing acute hematemesis.    Assessment and Plan:   Concern for acute upper GI bleed - Known history of gastric ulcer, discharged 12/8 with recent diagnosis of bleeding gastric ulcer.  Currently on PPI twice daily, NPO.  GI consulted and following closely.  Scheduled for EGD later this morning.  Concern for ongoing bleeding given drop in hemoglobin.  Patient not complaining of any hematemesis.   Acute blood loss anemia - S/p 1 units PRBCs.  Hemoglobin down to 7.0 this morning.  Will order 2 units PRBCs.  Trend CBC every 6 hours.   Atrial fibrillation - Holding Eliquis  due to above.  Currently rate controlled.  Will resume rate control medications once cleared for p.o.   Chronic lymphedema - Noted chronic appearing lipodermatosclerosis in BL LE.  Stockings at night.  As needed diuretic can be considered.   OSA on CPAP - CPAP at night.

## 2024-01-17 NOTE — Progress Notes (Signed)
 Transported patient to the operating room while patient was on the mechanical ventilator. Patient remained stable.

## 2024-01-17 NOTE — Transfer of Care (Signed)
 Immediate Anesthesia Transfer of Care Note  Patient: Raymond Hurst  Procedure(s) Performed: LAPAROTOMY, EXPLORATORY  Patient Location: ICU  Anesthesia Type:General  Level of Consciousness: sedated and Patient remains intubated per anesthesia plan  Airway & Oxygen Therapy: Patient remains intubated per anesthesia plan and Patient placed on Ventilator (see vital sign flow sheet for setting)  Post-op Assessment: Report given to RN and Post -op Vital signs reviewed and stable  Post vital signs: Reviewed and stable  Last Vitals:  Vitals Value Taken Time  BP 156/96 01/17/24 21:00  Temp    Pulse 94 01/17/24 21:00  Resp 35 01/17/24 21:00  SpO2 96 % 01/17/24 21:00    Last Pain:  Vitals:   01/17/24 1530  TempSrc: Axillary  PainSc:          Complications: No notable events documented.

## 2024-01-18 ENCOUNTER — Inpatient Hospital Stay (HOSPITAL_COMMUNITY)

## 2024-01-18 ENCOUNTER — Encounter (HOSPITAL_COMMUNITY): Payer: Self-pay | Admitting: General Surgery

## 2024-01-18 DIAGNOSIS — I4891 Unspecified atrial fibrillation: Secondary | ICD-10-CM | POA: Diagnosis not present

## 2024-01-18 DIAGNOSIS — R571 Hypovolemic shock: Secondary | ICD-10-CM

## 2024-01-18 DIAGNOSIS — D696 Thrombocytopenia, unspecified: Secondary | ICD-10-CM | POA: Diagnosis not present

## 2024-01-18 DIAGNOSIS — Z8673 Personal history of transient ischemic attack (TIA), and cerebral infarction without residual deficits: Secondary | ICD-10-CM | POA: Diagnosis not present

## 2024-01-18 DIAGNOSIS — R578 Other shock: Secondary | ICD-10-CM

## 2024-01-18 DIAGNOSIS — J449 Chronic obstructive pulmonary disease, unspecified: Secondary | ICD-10-CM | POA: Diagnosis not present

## 2024-01-18 DIAGNOSIS — N179 Acute kidney failure, unspecified: Secondary | ICD-10-CM | POA: Diagnosis not present

## 2024-01-18 DIAGNOSIS — C3412 Malignant neoplasm of upper lobe, left bronchus or lung: Secondary | ICD-10-CM | POA: Diagnosis not present

## 2024-01-18 DIAGNOSIS — C7972 Secondary malignant neoplasm of left adrenal gland: Secondary | ICD-10-CM | POA: Diagnosis not present

## 2024-01-18 DIAGNOSIS — K922 Gastrointestinal hemorrhage, unspecified: Secondary | ICD-10-CM | POA: Diagnosis not present

## 2024-01-18 DIAGNOSIS — E872 Acidosis, unspecified: Secondary | ICD-10-CM | POA: Diagnosis not present

## 2024-01-18 DIAGNOSIS — G4733 Obstructive sleep apnea (adult) (pediatric): Secondary | ICD-10-CM | POA: Diagnosis not present

## 2024-01-18 DIAGNOSIS — Z8711 Personal history of peptic ulcer disease: Secondary | ICD-10-CM | POA: Diagnosis not present

## 2024-01-18 LAB — PREPARE FRESH FROZEN PLASMA
Unit division: 0
Unit division: 0
Unit division: 0
Unit division: 0
Unit division: 0
Unit division: 0
Unit division: 0
Unit division: 0
Unit division: 0
Unit division: 0
Unit division: 0
Unit division: 0
Unit division: 0
Unit division: 0
Unit division: 0
Unit division: 0
Unit division: 0

## 2024-01-18 LAB — BPAM FFP
Blood Product Expiration Date: 202512172359
Blood Product Expiration Date: 202512202359
Blood Product Expiration Date: 202512202359
Blood Product Expiration Date: 202512202359
Blood Product Expiration Date: 202512202359
Blood Product Expiration Date: 202512202359
Blood Product Expiration Date: 202512202359
Blood Product Expiration Date: 202512202359
Blood Product Expiration Date: 202512202359
Blood Product Expiration Date: 202512202359
Blood Product Expiration Date: 202512202359
Blood Product Expiration Date: 202512202359
Blood Product Expiration Date: 202512222359
Blood Product Expiration Date: 202512222359
Blood Product Expiration Date: 202512222359
Blood Product Expiration Date: 202512222359
Blood Product Expiration Date: 202512222359
Blood Product Expiration Date: 202512222359
Blood Product Expiration Date: 202512222359
Blood Product Expiration Date: 202512222359
Blood Product Expiration Date: 202512222359
Blood Product Expiration Date: 202512222359
Blood Product Expiration Date: 202601082359
Blood Product Expiration Date: 202601082359
Blood Product Expiration Date: 202601092359
Blood Product Expiration Date: 202601092359
Blood Product Expiration Date: 202601092359
Blood Product Expiration Date: 202601092359
ISSUE DATE / TIME: 202512171632
ISSUE DATE / TIME: 202512171632
ISSUE DATE / TIME: 202512171632
ISSUE DATE / TIME: 202512171632
ISSUE DATE / TIME: 202512171632
ISSUE DATE / TIME: 202512171632
ISSUE DATE / TIME: 202512171632
ISSUE DATE / TIME: 202512171632
ISSUE DATE / TIME: 202512171728
ISSUE DATE / TIME: 202512171728
ISSUE DATE / TIME: 202512171728
ISSUE DATE / TIME: 202512171728
ISSUE DATE / TIME: 202512171734
ISSUE DATE / TIME: 202512171734
ISSUE DATE / TIME: 202512171734
ISSUE DATE / TIME: 202512171734
ISSUE DATE / TIME: 202512171734
ISSUE DATE / TIME: 202512171734
ISSUE DATE / TIME: 202512171735
ISSUE DATE / TIME: 202512171735
ISSUE DATE / TIME: 202512171826
ISSUE DATE / TIME: 202512171826
ISSUE DATE / TIME: 202512171826
ISSUE DATE / TIME: 202512171826
ISSUE DATE / TIME: 202512171826
ISSUE DATE / TIME: 202512171826
ISSUE DATE / TIME: 202512171826
ISSUE DATE / TIME: 202512171826
Unit Type and Rh: 600
Unit Type and Rh: 600
Unit Type and Rh: 600
Unit Type and Rh: 600
Unit Type and Rh: 6200
Unit Type and Rh: 6200
Unit Type and Rh: 6200
Unit Type and Rh: 6200
Unit Type and Rh: 6200
Unit Type and Rh: 6200
Unit Type and Rh: 6200
Unit Type and Rh: 6200
Unit Type and Rh: 6200
Unit Type and Rh: 6200
Unit Type and Rh: 6200
Unit Type and Rh: 6200
Unit Type and Rh: 6200
Unit Type and Rh: 6200
Unit Type and Rh: 6200
Unit Type and Rh: 6200
Unit Type and Rh: 6200
Unit Type and Rh: 6200
Unit Type and Rh: 6200
Unit Type and Rh: 6200
Unit Type and Rh: 6200
Unit Type and Rh: 6200
Unit Type and Rh: 6200
Unit Type and Rh: 6200

## 2024-01-18 LAB — POCT I-STAT 7, (LYTES, BLD GAS, ICA,H+H)
Acid-Base Excess: 0 mmol/L (ref 0.0–2.0)
Acid-base deficit: 2 mmol/L (ref 0.0–2.0)
Acid-base deficit: 1 mmol/L (ref 0.0–2.0)
Bicarbonate: 19.7 mmol/L — ABNORMAL LOW (ref 20.0–28.0)
Bicarbonate: 21.1 mmol/L (ref 20.0–28.0)
Bicarbonate: 22.5 mmol/L (ref 20.0–28.0)
Calcium, Ion: 1.28 mmol/L (ref 1.15–1.40)
Calcium, Ion: 1.29 mmol/L (ref 1.15–1.40)
Calcium, Ion: 1.25 mmol/L (ref 1.15–1.40)
HCT: 48 % (ref 39.0–52.0)
HCT: 48 % (ref 39.0–52.0)
HCT: 49 % (ref 39.0–52.0)
Hemoglobin: 16.3 g/dL (ref 13.0–17.0)
Hemoglobin: 16.3 g/dL (ref 13.0–17.0)
Hemoglobin: 16.7 g/dL (ref 13.0–17.0)
O2 Saturation: 100 %
O2 Saturation: 100 %
O2 Saturation: 98 %
Patient temperature: 98.6
Patient temperature: 37.5
Patient temperature: 36.6
Potassium: 3.4 mmol/L — ABNORMAL LOW (ref 3.5–5.1)
Potassium: 3.6 mmol/L (ref 3.5–5.1)
Potassium: 3.7 mmol/L (ref 3.5–5.1)
Sodium: 144 mmol/L (ref 135–145)
Sodium: 145 mmol/L (ref 135–145)
Sodium: 146 mmol/L — ABNORMAL HIGH (ref 135–145)
TCO2: 21 mmol/L — ABNORMAL LOW (ref 22–32)
TCO2: 22 mmol/L (ref 22–32)
TCO2: 23 mmol/L (ref 22–32)
pCO2 arterial: 27 mmHg — ABNORMAL LOW (ref 32–48)
pCO2 arterial: 30.2 mmHg — ABNORMAL LOW (ref 32–48)
pCO2 arterial: 29.8 mmHg — ABNORMAL LOW (ref 32–48)
pH, Arterial: 7.471 — ABNORMAL HIGH (ref 7.35–7.45)
pH, Arterial: 7.454 — ABNORMAL HIGH (ref 7.35–7.45)
pH, Arterial: 7.485 — ABNORMAL HIGH (ref 7.35–7.45)
pO2, Arterial: 349 mmHg — ABNORMAL HIGH (ref 83–108)
pO2, Arterial: 217 mmHg — ABNORMAL HIGH (ref 83–108)
pO2, Arterial: 94 mmHg (ref 83–108)

## 2024-01-18 LAB — GLUCOSE, CAPILLARY
Glucose-Capillary: 125 mg/dL — ABNORMAL HIGH (ref 70–99)
Glucose-Capillary: 132 mg/dL — ABNORMAL HIGH (ref 70–99)
Glucose-Capillary: 133 mg/dL — ABNORMAL HIGH (ref 70–99)
Glucose-Capillary: 135 mg/dL — ABNORMAL HIGH (ref 70–99)
Glucose-Capillary: 136 mg/dL — ABNORMAL HIGH (ref 70–99)
Glucose-Capillary: 161 mg/dL — ABNORMAL HIGH (ref 70–99)

## 2024-01-18 LAB — BASIC METABOLIC PANEL WITH GFR
Anion gap: 14 (ref 5–15)
Anion gap: 18 — ABNORMAL HIGH (ref 5–15)
BUN: 32 mg/dL — ABNORMAL HIGH (ref 8–23)
BUN: 37 mg/dL — ABNORMAL HIGH (ref 8–23)
CO2: 18 mmol/L — ABNORMAL LOW (ref 22–32)
CO2: 21 mmol/L — ABNORMAL LOW (ref 22–32)
Calcium: 10.1 mg/dL (ref 8.9–10.3)
Calcium: 10.4 mg/dL — ABNORMAL HIGH (ref 8.9–10.3)
Chloride: 110 mmol/L (ref 98–111)
Chloride: 111 mmol/L (ref 98–111)
Creatinine, Ser: 1.11 mg/dL (ref 0.61–1.24)
Creatinine, Ser: 1.28 mg/dL — ABNORMAL HIGH (ref 0.61–1.24)
GFR, Estimated: 60 mL/min — ABNORMAL LOW (ref >60)
GFR, Estimated: >60 mL/min (ref >60)
Glucose, Bld: 156 mg/dL — ABNORMAL HIGH (ref 70–99)
Glucose, Bld: 174 mg/dL — ABNORMAL HIGH (ref 70–99)
Potassium: 3.7 mmol/L (ref 3.5–5.1)
Potassium: 3.7 mmol/L (ref 3.5–5.1)
Sodium: 145 mmol/L (ref 135–145)
Sodium: 145 mmol/L (ref 135–145)

## 2024-01-18 LAB — CBC WITH DIFFERENTIAL/PLATELET
Abs Immature Granulocytes: 0.05 K/uL (ref 0.00–0.07)
Basophils Absolute: 0 K/uL (ref 0.0–0.1)
Basophils Relative: 0 %
Eosinophils Absolute: 0 K/uL (ref 0.0–0.5)
Eosinophils Relative: 0 %
HCT: 50.4 % (ref 39.0–52.0)
Hemoglobin: 17.7 g/dL — ABNORMAL HIGH (ref 13.0–17.0)
Immature Granulocytes: 1 %
Lymphocytes Relative: 3 %
Lymphs Abs: 0.3 K/uL — ABNORMAL LOW (ref 0.7–4.0)
MCH: 29.5 pg (ref 26.0–34.0)
MCHC: 35.1 g/dL (ref 30.0–36.0)
MCV: 84 fL (ref 80.0–100.0)
Monocytes Absolute: 0.7 K/uL (ref 0.1–1.0)
Monocytes Relative: 7 %
Neutro Abs: 9 K/uL — ABNORMAL HIGH (ref 1.7–7.7)
Neutrophils Relative %: 89 %
Platelets: 67 K/uL — ABNORMAL LOW (ref 150–400)
RBC: 6 MIL/uL — ABNORMAL HIGH (ref 4.22–5.81)
RDW: 15 % (ref 11.5–15.5)
WBC: 10 K/uL (ref 4.0–10.5)
nRBC: 0 % (ref 0.0–0.2)

## 2024-01-18 LAB — BPAM PLATELET PHERESIS
Blood Product Expiration Date: 202512192359
Blood Product Expiration Date: 202512192359
Blood Product Expiration Date: 202512202359
ISSUE DATE / TIME: 202512171741
ISSUE DATE / TIME: 202512171754
ISSUE DATE / TIME: 202512171823
Unit Type and Rh: 5100
Unit Type and Rh: 6200
Unit Type and Rh: 6200

## 2024-01-18 LAB — HEMOGLOBIN AND HEMATOCRIT, BLOOD
HCT: 49.6 % (ref 39.0–52.0)
HCT: 48.2 % (ref 39.0–52.0)
HCT: 48.6 % (ref 39.0–52.0)
Hemoglobin: 17.3 g/dL — ABNORMAL HIGH (ref 13.0–17.0)
Hemoglobin: 17.2 g/dL — ABNORMAL HIGH (ref 13.0–17.0)
Hemoglobin: 17.4 g/dL — ABNORMAL HIGH (ref 13.0–17.0)

## 2024-01-18 LAB — CBC
HCT: 48.2 % (ref 39.0–52.0)
Hemoglobin: 17.5 g/dL — ABNORMAL HIGH (ref 13.0–17.0)
MCH: 30.3 pg (ref 26.0–34.0)
MCHC: 36.3 g/dL — ABNORMAL HIGH (ref 30.0–36.0)
MCV: 83.5 fL (ref 80.0–100.0)
Platelets: 67 K/uL — ABNORMAL LOW (ref 150–400)
RBC: 5.77 MIL/uL (ref 4.22–5.81)
RDW: 15.5 % (ref 11.5–15.5)
WBC: 11.1 K/uL — ABNORMAL HIGH (ref 4.0–10.5)
nRBC: 0 % (ref 0.0–0.2)

## 2024-01-18 LAB — PREPARE PLATELET PHERESIS
Unit division: 0
Unit division: 0
Unit division: 0

## 2024-01-18 LAB — DIC (DISSEMINATED INTRAVASCULAR COAGULATION)PANEL
D-Dimer, Quant: >20 ug{FEU}/mL — ABNORMAL HIGH (ref 0.00–0.50)
Fibrinogen: 171 mg/dL — ABNORMAL LOW (ref 210–475)
INR: 1.1 (ref 0.8–1.2)
Platelets: 65 K/uL — ABNORMAL LOW (ref 150–400)
Prothrombin Time: 15.2 s (ref 11.4–15.2)
Smear Review: NONE SEEN
aPTT: 29 s (ref 24–36)

## 2024-01-18 LAB — PREPARE CRYOPRECIPITATE

## 2024-01-18 LAB — TRIGLYCERIDES: Triglycerides: 89 mg/dL (ref <150)

## 2024-01-18 LAB — POCT I-STAT, CHEM 8
BUN: 46 mg/dL — ABNORMAL HIGH (ref 8–23)
Calcium, Ion: 1.25 mmol/L (ref 1.15–1.40)
Chloride: 112 mmol/L — ABNORMAL HIGH (ref 98–111)
Creatinine, Ser: 1.7 mg/dL — ABNORMAL HIGH (ref 0.61–1.24)
Glucose, Bld: 128 mg/dL — ABNORMAL HIGH (ref 70–99)
HCT: 48 % (ref 39.0–52.0)
Hemoglobin: 16.3 g/dL (ref 13.0–17.0)
Potassium: 3.7 mmol/L (ref 3.5–5.1)
Sodium: 145 mmol/L (ref 135–145)
TCO2: 21 mmol/L — ABNORMAL LOW (ref 22–32)

## 2024-01-18 LAB — BPAM CRYOPRECIPITATE
Blood Product Expiration Date: 202512172132
ISSUE DATE / TIME: 202512171751
Unit Type and Rh: 8400

## 2024-01-18 LAB — LACTIC ACID, PLASMA
Lactic Acid, Venous: 4.1 mmol/L (ref 0.5–1.9)
Lactic Acid, Venous: 5.9 mmol/L (ref 0.5–1.9)

## 2024-01-18 LAB — MAGNESIUM: Magnesium: 1.5 mg/dL — ABNORMAL LOW (ref 1.7–2.4)

## 2024-01-18 LAB — HEMOGLOBIN A1C
Hgb A1c MFr Bld: 5.5 % (ref 4.8–5.6)
Mean Plasma Glucose: 111.15 mg/dL

## 2024-01-18 LAB — PROTIME-INR
INR: 1 (ref 0.8–1.2)
Prothrombin Time: 14.1 s (ref 11.4–15.2)

## 2024-01-18 MED ORDER — MAGNESIUM SULFATE 2 GM/50ML IV SOLN
2.0000 g | Freq: Once | INTRAVENOUS | Status: AC
  Administered 2024-01-18: 19:00:00 2 g via INTRAVENOUS
  Filled 2024-01-18: qty 50

## 2024-01-18 MED ORDER — HEPARIN SODIUM (PORCINE) 1000 UNIT/ML IJ SOLN
INTRAMUSCULAR | Status: AC
  Administered 2024-01-18: 09:00:00 3000 [IU]
  Filled 2024-01-18: qty 3

## 2024-01-18 MED ORDER — ORAL CARE MOUTH RINSE: 15.0000 mL | OROMUCOSAL | Status: DC | PRN

## 2024-01-18 MED ORDER — ORAL CARE MOUTH RINSE
15.0000 mL | OROMUCOSAL | Status: DC
  Administered 2024-01-18 (×4): 15 mL via OROMUCOSAL

## 2024-01-18 MED ORDER — FUROSEMIDE 10 MG/ML IJ SOLN
40.0000 mg | Freq: Once | INTRAMUSCULAR | Status: AC
  Administered 2024-01-18: 09:00:00 40 mg via INTRAVENOUS
  Filled 2024-01-18: qty 4

## 2024-01-18 MED ORDER — ARFORMOTEROL TARTRATE 15 MCG/2ML IN NEBU
15.0000 ug | INHALATION_SOLUTION | Freq: Two times a day (BID) | RESPIRATORY_TRACT | Status: DC
  Administered 2024-01-18 – 2024-01-27 (×17): 15 ug via RESPIRATORY_TRACT
  Filled 2024-01-18 (×18): qty 2

## 2024-01-18 MED ORDER — REVEFENACIN 175 MCG/3ML IN SOLN
175.0000 ug | Freq: Every day | RESPIRATORY_TRACT | Status: DC
  Administered 2024-01-18 – 2024-01-27 (×9): 175 ug via RESPIRATORY_TRACT
  Filled 2024-01-18 (×9): qty 3

## 2024-01-18 MED ORDER — POTASSIUM CHLORIDE 10 MEQ/50ML IV SOLN
10.0000 meq | INTRAVENOUS | Status: AC
  Administered 2024-01-18 (×4): 10 meq via INTRAVENOUS
  Filled 2024-01-18 (×4): qty 50

## 2024-01-18 MED ORDER — ORAL CARE MOUTH RINSE
15.0000 mL | OROMUCOSAL | Status: DC
  Administered 2024-01-18 – 2024-01-21 (×41): 15 mL via OROMUCOSAL

## 2024-01-18 NOTE — Progress Notes (Signed)
 [facility] Progress Note  1 Day Post-Op  Subjective: CC:  Intubated, sedated Off of pressors   Objective: Vital signs in last 24 hours: Temp:  [96.1 F (35.6 C)-99.7 F (37.6 C)] 98.2 F (36.8 C) (12/18 1000) Pulse Rate:  [48-135] 58 (12/18 1000) Resp:  [14-35] 25 (12/18 1000) BP: (43-153)/(29-96) 106/74 (12/18 1000) SpO2:  [83 %-100 %] 94 % (12/18 1000) Arterial Line BP: (55-158)/(33-96) 124/68 (12/18 1000) FiO2 (%):  [40 %-100 %] 40 % (12/18 0737) Last BM Date :  (PTA)  Intake/Output from previous day: 12/17 0701 - 12/18 0700 In: 8498.1 [I.V.:5335.2; Blood:726; IV Piggyback:2436.9] Out: 6665 [Urine:2515; Emesis/NG output:1800; Drains:1950; Blood:400] Intake/Output this shift: Total I/O In: 147.5 [I.V.:73.5; IV Piggyback:74] Out: -   PE: Gen:  acutely and chronically ill appearing, intubated Card:  Regular rate and rhythm, edema of extremities present Abd: Soft, protuberant, VAC in place holding suction, SS output (almost 2L) GU: foley in Skin: warm and dry, no rashes  Psych: unable to assess  Lab Results:  Recent Labs    01/17/24 2347 01/18/24 0230 01/18/24 0425 01/18/24 0439 01/18/24 0701  WBC 10.0  --  11.1*  --   --   HGB 17.7*   < > 17.5* 16.3  --   HCT 50.4   < > 48.2 48.0  --   PLT 67*  --  67*  --  65*   < > = values in this interval not displayed.   BMET Recent Labs    01/17/24 2347 01/18/24 0230 01/18/24 0425 01/18/24 0439  NA 145   < > 145 145  K 3.7   < > 3.7 3.6  CL 110  --  111  --   CO2 18*  --  21*  --   GLUCOSE 174*  --  156*  --   BUN 32*  --  37*  --   CREATININE 1.11  --  1.28*  --   CALCIUM  10.4*  --  10.1  --    < > = values in this interval not displayed.   PT/INR Recent Labs    01/18/24 0701 01/18/24 0702  LABPROT 15.2 14.1  INR 1.1 1.0   CMP     Component Value Date/Time   NA 145 01/18/2024 0439   NA 137 06/01/2023 1439   K 3.6 01/18/2024 0439   CL 111 01/18/2024 0425   CO2 21 (L) 01/18/2024  0425   GLUCOSE 156 (H) 01/18/2024 0425   BUN 37 (H) 01/18/2024 0425   BUN 9 06/01/2023 1439   CREATININE 1.28 (H) 01/18/2024 0425   CREATININE 0.91 01/12/2024 1048   CALCIUM  10.1 01/18/2024 0425   PROT 5.7 (L) 01/16/2024 1623   PROT 6.2 06/01/2023 1439   ALBUMIN 3.5 01/16/2024 1623   ALBUMIN 3.0 (L) 06/01/2023 1439   AST 13 (L) 01/16/2024 1623   AST 14 (L) 01/12/2024 1048   ALT 6 01/16/2024 1623   ALT 5 01/12/2024 1048   ALKPHOS 49 01/16/2024 1623   BILITOT 0.6 01/16/2024 1623   BILITOT 0.5 01/12/2024 1048   GFRNONAA 60 (L) 01/18/2024 0425   GFRNONAA >60 01/12/2024 1048   GFRAA >60 06/13/2019 1350   Lipase     Component Value Date/Time   LIPASE 28 09/07/2023 2047       Studies/Results: DG Chest Port 1 View Result Date: 01/18/2024 CLINICAL DATA:  Endotracheal tube placement EXAM: PORTABLE CHEST 1 VIEW COMPARISON:  Yesterday FINDINGS: Stable cardiomegaly. Endotracheal tube is in grossly  good position. Nasogastric tube is seen entering stomach. Right internal jugular catheter is unchanged. Increased left apical opacity is noted concerning for worsening infiltrate and possible associated loculated pleural effusion. Stable left basilar atelectasis is noted with small pleural effusion. IMPRESSION: Endotracheal tube is in grossly good position. Increased left apical opacity is noted concerning for worsening infiltrate and possible associated loculated pleural effusion. Electronically Signed   By: Lynwood Landy Raddle M.D.   On: 01/18/2024 08:58   DG Chest Port 1 View Result Date: 01/17/2024 EXAM: 1 VIEW(S) XRAY OF THE CHEST 01/17/2024 03:06:00 PM COMPARISON: 01/17/2024 CLINICAL HISTORY: Post central line placement. FINDINGS: LINES, TUBES AND DEVICES: Endotracheal tube present with tip measuring 4.4 cm above the carina. Right central venous catheter with tip over the cavoatrial junction region. LUNGS AND PLEURA: Shallow inspiration. Probable bilateral pleural effusions with infiltration or  atelectasis in the left lung base. Left basilar infiltrates are somewhat improved since previous study. No pneumothorax. HEART AND MEDIASTINUM: Cardiac enlargement. BONES AND SOFT TISSUES: Degenerative changes in the spine. No acute osseous abnormality. IMPRESSION: 1. Right central venous catheter tip projects at the cavoatrial junction region, with no pneumothorax. 2. Endotracheal tube tip projects 4.4 cm above the carina. 3. Probable bilateral pleural effusions and left basilar infiltrates, somewhat improved since previous study. 4. Cardiac enlargement. Electronically signed by: Elsie Gravely MD 01/17/2024 03:25 PM EST RP Workstation: [id number]   DG CHEST PORT 1 VIEW Result Date: 01/17/2024 EXAM: 1 VIEW(S) XRAY OF THE CHEST 01/17/2024 11:39:00 AM COMPARISON: 10/31/2023 CLINICAL HISTORY: Endotracheally intubated FINDINGS: LINES, TUBES AND DEVICES: Endotracheal tube in place, 3.9 cm above the carina. LUNGS AND PLEURA: Dense opacity in left mid to lower lung. Suspected left pleural effusion. Mild atelectasis within right lung. Mild pulmonary edema. No pneumothorax. HEART AND MEDIASTINUM: Cardiomegaly. BONES AND SOFT TISSUES: No acute osseous abnormality. IMPRESSION: 1. Dense opacity in left mid to lower lung, suspected left pleural effusion, and mild atelectasis within right lung. 2. Mild pulmonary edema. 3. Cardiomegaly. Electronically signed by: Evalene Coho MD 01/17/2024 12:21 PM EST RP Workstation: [id number]    Anti-infectives: Anti-infectives (From admission, onward)    Start     Dose/Rate Route Frequency Ordered Stop   01/17/24 2245  piperacillin -tazobactam (ZOSYN ) IVPB 3.375 g        3.375 g 12.5 mL/hr over 240 Minutes Intravenous Every 8 hours 01/17/24 2149     01/17/24 2200  fluconazole  (DIFLUCAN ) IVPB 400 mg        400 mg 100 mL/hr over 120 Minutes Intravenous Every 24 hours 01/17/24 2059         Assessment/Plan  duodenal ulcer hemorrhage  PMH EGD and IR coil embolization of  gastric ulcer in August 2025 Presented to [facility] 12/16 w/ hematemesis, EGD 12/17 showed bleed from duodenal ulcer, clips placed Transferred to [facility] for critical care services and IR consult Arrived in hemorrhagic shock, critical, MTP started, taken emergently to the OR for below:   S/P LAPAROTOMY, EXPLORATORY, Duodenotom, Ligation of GDA and branches, ABThera wound VAC placement 12/17 Dr. Rubin  - continue NG To LIWS  - continue IV abx - tentative plan to return to OR tomorrow for second look laparotomy, possible abdominal closure  Per CCM: Hemorrhagic shock - s/p MTP, off of pressors  Metastatic lung CA COPD OSA AKI  PMH CVA PMH afib on eliquis  - DOAC held in the setting of acute bleeding   LOS: 1 day   I reviewed nursing notes, hospitalist notes, last 24 h vitals and  pain scores, last 48 h intake and output, last 24 h labs and trends, and last 24 h imaging results.  This care required moderate level of medical decision making.   Almarie Pringle, PA-C [facility] Please see Amion for pager number during day hours 7:00am-4:30pm

## 2024-01-18 NOTE — Plan of Care (Signed)
  Problem: Clinical Measurements: Goal: Diagnostic test results will improve Outcome: Progressing Goal: Respiratory complications will improve Outcome: Progressing   Problem: Elimination: Goal: Will not experience complications related to bowel motility Outcome: Progressing

## 2024-01-18 NOTE — Consult Note (Signed)
 NAME:  Raymond Hurst, MRN:  [id number], DOB:  December 05, 1950, LOS: 1 ADMISSION DATE:  01/16/2024, CONSULTATION DATE:  12/17 REFERRING MD:  Arlon CHIEF COMPLAINT:  GIB   History of Present Illness:  73 year old male with past medical history of atrial fibrillation on Eliquis , stroke, OSA on CPAP, COPD, history of GIB 2/2 gastric ulcer who presented to [facility] ED with hematemesis.   He presented 12/16 with new hematemesis after restarting Eliquis . Had been admitted 01/06/24-01/08/24 with bloody stool found to have gastric ulcer. He was cleared by GI to restart Eliquis . He had episode at home of dizziness, hematemesis. In ED had initial systolics in 80s and given IVF, 1U PRBC, Kcentra , PPI. GI was reconsulted. 12/17 had ongoing blood loss anemia without recurrent hematemesis and was given additional 2U  PRBC. EGD showed duodenal active bleed which was clipped. He was intubated during the procedure and admitted to ICU after for ongoing care. Recommended that he transfer to [facility] for IR consult.   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 Not an IR candidate. Underwent massive transfusion protocol. Taken to OR by gen surg for emergent surgery. S/p ex-lap duodenotomy and ligation of gastroduodenal artery.     Interim History / Subjective:   Patient taken to OR yesterday Off pressors this morning Remains intubated   Objective   Blood pressure (!) 147/90, pulse 60, temperature 99 F (37.2 C), temperature source Bladder, resp. rate (!) 28, height 6' (1.829 m), weight 119.7 kg, SpO2 95%.    Vent Mode: PRVC FiO2 (%):  [40 %-100 %] 40 % Set  Rate:  [18 bmp-35 bmp] 28 bmp Vt Set:  [379 mL] 620 mL PEEP:  [5 cmH20-12 cmH20] 12 cmH20 Plateau Pressure:  [15 cmH20-38 cmH20] 26 cmH20   Intake/Output Summary (Last 24 hours) at 01/18/2024 9167 Last data filed at 01/18/2024 0800 Gross per 24 hour  Intake 8535.57 ml  Output 6265 ml  Net 2270.57 ml   Filed Weights   01/16/24 1620  Weight: 119.7 kg    Examination: General: ill appearing, intubated, sedated HENT: moist mucous membranes, ETT in place Lungs: course breath sounds bilaterally, no wheezing Cardiovascular: bradycardic irregular  Abdomen: distended, midline wound vac in place Extremities: chronic lymphedema with superimposed 2+ edema Neuro: Pupils pinpoint, sedated GU: foley in place  Labs reviewed Cr 1.28 LA 4.1 Plts 65   Resolved Hospital Problem list    Assessment & Plan:  Hemorrhagic shock 2/2 acute UGIB - duodenal s/p clip  Recent history of gastric ulcer bleed  Thrombocytopenia - s/p massive transfusion protocol 12/17 and Kcentra , TXA and factor 7. - s/p laparotomy 12/17 for duodenotomy and ligation of gastroduodenal artery - emergent left CVL and A-line placed, will remove  - empiric zosyn  and fluconazole  - continue NG tube to suction - no vasopressor needs at this time  Post-procedural vent management  COPD  OSA on CPAP  - continue vent support - Maintain plateau pressures below 30cmH2O - yupelri  and brovana  nebs  AKI Lactic Acidosis - trend serum Cr and UOP - Volume overloaded at this time, will give 40mg  IV lasix  this AM - trend lactic acid  Atrial fibrillation  on Eliquis  History of stroke  - hold anticoagulation - currently rate controlled  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  See PMH above for treatment hx.  Nutrition - NPO  Labs   CBC: Recent Labs  Lab 01/12/24 1048 01/16/24 1623 01/16/24 1708 01/17/24 0441 01/17/24 1407 01/17/24 2028 01/17/24 2347  01/18/24 0230 01/18/24 0425 01/18/24 0439 01/18/24 0701  WBC 6.4 6.8  --  4.8  --   --  10.0  --  11.1*  --   --   NEUTROABS 5.2 5.4  --   --   --   --  9.0*  --   --   --   --   HGB 10.2* 7.7*   < > 7.0*   < > 14.3 17.7* 16.3 17.5* 16.3  --   HCT 32.5* 25.4*   < > 22.1*   < > 42.0 50.4 48.0 48.2 48.0  --   MCV 83.5 87.3  --  86.7  --   --  84.0  --  83.5  --   --   PLT 225 257  --  191  --   --  67*  --  67*  --  65*   < > = values in this interval not displayed.    Basic Metabolic Panel: Recent Labs  Lab 01/12/24 1048 01/16/24 1623 01/16/24 1708 01/17/24 0441 01/17/24 1755 01/17/24 2028 01/17/24 2347 01/18/24 0230 01/18/24 0425 01/18/24 0439  NA 138 137 137 140   < > 147* 145 144 145 145  K 4.0 3.6 3.6 4.1   < > 3.3* 3.7 3.4* 3.7 3.6  CL 104 103 104 108  --   --  110  --  111  --   CO2 25 19*  --  22  --   --  18*  --  21*  --   GLUCOSE 118* 178* 172* 87  --   --  174*  --  156*  --   BUN 14 28* 27* 31*  --   --  32*  --  37*  --   CREATININE 0.91 0.79 0.90 0.66  --   --  1.11  --  1.28*  --   CALCIUM  9.4 8.4*  --  8.1*  --   --  10.4*  --  10.1  --    < > = values in this interval not displayed.   GFR: Estimated Creatinine Clearance: 70.7 mL/min (A) (by C-G formula based on SCr of 1.28 mg/dL (H)). Recent Labs  Lab 01/16/24 1623 01/17/24 0441 01/17/24 2347 01/18/24 0425  WBC 6.8 4.8 10.0 11.1*  LATICACIDVEN  --   --  5.9* 4.1*    Liver Function Tests: Recent Labs  Lab 01/12/24 1048 01/16/24 1623  AST 14* 13*  ALT 5 6  ALKPHOS 66 49  BILITOT 0.5 0.6  PROT 6.7 5.7*  ALBUMIN 3.6 3.5   No results for input(s): LIPASE, AMYLASE in the last 168 hours. No results for input(s): AMMONIA in the last 168 hours.  ABG    Component Value Date/Time   PHART 7.454 (H) 01/18/2024 0439   PCO2ART 30.2 (L) 01/18/2024 0439   PO2ART 217 (H) 01/18/2024 0439   HCO3 21.1 01/18/2024 0439   TCO2 22 01/18/2024 0439   ACIDBASEDEF 1.0 01/18/2024 0439   O2SAT 100  01/18/2024 0439     Coagulation Profile: Recent Labs  Lab 01/18/24 0701 01/18/24 0702  INR 1.1  1.0    Cardiac Enzymes: No results for input(s): CKTOTAL, CKMB, CKMBINDEX, TROPONINI in the last 168 hours.  HbA1C: Hgb A1c MFr Bld  Date/Time Value Ref Range Status  01/17/2024 11:47 PM 5.5 4.8 - 5.6 % Final    Comment:    (NOTE) Diagnosis of Diabetes The following HbA1c ranges recommended by the American Diabetes Association (ADA) may be used as an aid in the diagnosis of diabetes mellitus.  Hemoglobin             Suggested A1C NGSP%              Diagnosis  <5.7                   Non Diabetic  5.7-6.4                Pre-Diabetic  >6.4                   Diabetic  <7.0                   Glycemic control for                       adults with diabetes.    06/08/2018 12:25 AM 5.2 4.8 - 5.6 % Final    Comment:    (NOTE) Pre diabetes:          5.7%-6.4% Diabetes:              >6.4% Glycemic control for   <7.0% adults with diabetes     CBG: Recent Labs  Lab 01/17/24 2106 01/17/24 2317 01/18/24 0315 01/18/24 0730  GLUCAP 173* 162* 161* 136*       Critical care time: 35 mins    The patient is critically ill with multiple organ system failure and requires high complexity decision making for assessment and support, frequent evaluation and titration of therapies, advanced monitoring, review of radiographic studies and interpretation of complex data.     Dorn Chill, MD [facility] Office: [phone]   See Amion for personal pager PCCM on call pager [phone] until 7pm. Please call Elink 7p-7a. [phone]

## 2024-01-18 NOTE — Procedures (Signed)
 Arterial Catheter Insertion Procedure Note  JAVAUGHN OPDAHL  [id number]  [birth year]  Date:01/18/2024  Time:8:36 AM    Provider Performing: Lamar PARAS Quientin Jent    Procedure: Insertion of Arterial Line (63379) with US  guidance (23062)   Indication(s) Blood pressure monitoring and/or need for frequent ABGs  Consent Unable to obtain consent due to emergent nature of procedure.  Anesthesia Fentanyl  and versed . No local anesthetic due to emergent nature of procedure (peri-code).   Time Out Verified patient identification, verified procedure, site/side was marked, verified correct patient position, special equipment/implants available, medications/allergies/relevant history reviewed, required imaging and test results available.   Sterile Technique Maximal sterile technique was not able to be achieved due to emergent nature of procedure.   Procedure Description Area of catheter insertion was cleaned with chlorhexidine  and draped in sterile fashion. With real-time ultrasound guidance an arterial catheter was placed into the left femoral artery.  Appropriate arterial tracings confirmed on monitor.     Complications/Tolerance None; patient tolerated the procedure well.   EBL Minimal   Specimen(s) None

## 2024-01-18 NOTE — Procedures (Signed)
 Central Venous Catheter Insertion Procedure Note  TYREKE KAESER  [id number]  1950/11/08  Date:01/18/2024  Time:8:32 AM   Provider Performing:Torry Istre J Adrionna Delcid   Procedure: Insertion of Non-tunneled Central Venous Catheter(36556) with US  guidance (23062)   Indication(s) Massive transfusion for hemodynamically unstable UGIB  Consent Unable to obtain consent due to emergent nature of procedure.  Anesthesia Fentanyl  and versed . Local anesthetic not administered due to the emergent (peri-code) nature of the procedure.  Timeout Verified patient identification, verified procedure, site/side was marked, verified correct patient position, special equipment/implants available, medications/allergies/relevant history reviewed, required imaging and test results available.  Sterile Technique Maximal sterile technique was not able to be achieved due to emergent nature of procedure.  Procedure Description Area of catheter insertion was cleaned with chlorhexidine  and draped in sterile fashion.  With real-time ultrasound guidance a HD catheter was placed into the left femoral vein. Nonpulsatile blood flow and easy flushing noted in all ports.  The catheter was sutured in place and sterile dressing applied.  Complications/Tolerance None; patient tolerated the procedure well. Chest X-ray is ordered to verify placement for internal jugular or subclavian cannulation.   Chest x-ray is not ordered for femoral cannulation.  EBL Minimal  Specimen(s) None

## 2024-01-19 ENCOUNTER — Inpatient Hospital Stay (HOSPITAL_COMMUNITY): Admitting: Certified Registered Nurse Anesthetist

## 2024-01-19 ENCOUNTER — Encounter (HOSPITAL_COMMUNITY)
Admission: EM | Disposition: A | Discharge status: Home / Self Care | Payer: Self-pay | Source: Home / Self Care | Attending: Internal Medicine

## 2024-01-19 ENCOUNTER — Inpatient Hospital Stay (HOSPITAL_COMMUNITY)

## 2024-01-19 DIAGNOSIS — J9601 Acute respiratory failure with hypoxia: Secondary | ICD-10-CM

## 2024-01-19 DIAGNOSIS — D696 Thrombocytopenia, unspecified: Secondary | ICD-10-CM | POA: Diagnosis not present

## 2024-01-19 DIAGNOSIS — Z87891 Personal history of nicotine dependence: Secondary | ICD-10-CM | POA: Diagnosis not present

## 2024-01-19 DIAGNOSIS — J449 Chronic obstructive pulmonary disease, unspecified: Secondary | ICD-10-CM | POA: Diagnosis not present

## 2024-01-19 DIAGNOSIS — I482 Chronic atrial fibrillation, unspecified: Secondary | ICD-10-CM | POA: Diagnosis not present

## 2024-01-19 DIAGNOSIS — K7689 Other specified diseases of liver: Secondary | ICD-10-CM | POA: Diagnosis not present

## 2024-01-19 DIAGNOSIS — K922 Gastrointestinal hemorrhage, unspecified: Secondary | ICD-10-CM | POA: Diagnosis not present

## 2024-01-19 DIAGNOSIS — R571 Hypovolemic shock: Secondary | ICD-10-CM | POA: Diagnosis not present

## 2024-01-19 HISTORY — PX: LAPAROTOMY: SHX154

## 2024-01-19 LAB — TYPE AND SCREEN
ABO/RH(D): O NEG
Antibody Screen: NEGATIVE
Unit division: 0
Unit division: 0
Unit division: 0
Unit division: 0
Unit division: 0
Unit division: 0
Unit division: 0
Unit division: 0
Unit division: 0
Unit division: 0
Unit division: 0
Unit division: 0
Unit division: 0
Unit division: 0
Unit division: 0
Unit division: 0
Unit division: 0
Unit division: 0
Unit division: 0
Unit division: 0
Unit division: 0
Unit division: 0
Unit division: 0
Unit division: 0
Unit division: 0
Unit division: 0
Unit division: 0
Unit division: 0
Unit division: 0
Unit division: 0
Unit division: 0
Unit division: 0
Unit division: 0
Unit division: 0
Unit division: 0
Unit division: 0
Unit division: 0
Unit division: 0
Unit division: 0
Unit division: 0
Unit division: 0
Unit tag comment: 0

## 2024-01-19 LAB — BPAM RBC
Blood Product Expiration Date: 202601202359
Blood Product Expiration Date: 202601202359
Blood Product Expiration Date: 202601142359
Blood Product Expiration Date: 202601142359
Blood Product Expiration Date: 202601142359
Blood Product Expiration Date: 202601142359
Blood Product Expiration Date: 202601142359
Blood Product Expiration Date: 202601152359
Blood Product Expiration Date: 202601152359
Blood Product Expiration Date: 202601152359
Blood Product Expiration Date: 202601152359
Blood Product Expiration Date: 202601152359
Blood Product Expiration Date: 202601152359
Blood Product Expiration Date: 202601152359
Blood Product Expiration Date: 202601152359
Blood Product Expiration Date: 202601152359
Blood Product Expiration Date: 202601152359
Blood Product Expiration Date: 202601152359
Blood Product Expiration Date: 202601152359
Blood Product Expiration Date: 202601152359
Blood Product Expiration Date: 202601152359
Blood Product Expiration Date: 202601152359
Blood Product Unit Number: 202601152359
Blood Product Unit Number: 202601152359
Blood Product Unit Number: 202601152359
Blood Product Unit Number: 202601152359
Blood Product Unit Number: 202601192359
Blood Product Unit Number: 202601192359
Blood Product Unit Number: 202601202359
Blood Product Unit Number: 202601202359
Blood Product Unit Number: 202601202359
Blood Product Unit Number: 202601202359
ISSUE DATE / TIME: 202512171632
ISSUE DATE / TIME: 202512171727
ISSUE DATE / TIME: 202512171727
ISSUE DATE / TIME: 202512171727
ISSUE DATE / TIME: 202512171727
ISSUE DATE / TIME: 202512171732
ISSUE DATE / TIME: 202512171732
ISSUE DATE / TIME: 202512171732
ISSUE DATE / TIME: 202512171732
ISSUE DATE / TIME: 202512171732
ISSUE DATE / TIME: 202512171732
ISSUE DATE / TIME: 202512171732
ISSUE DATE / TIME: 202512171732
ISSUE DATE / TIME: 202512171748
ISSUE DATE / TIME: 202512171748
ISSUE DATE / TIME: 202512171748
ISSUE DATE / TIME: 202512171748
ISSUE DATE / TIME: 202512171748
ISSUE DATE / TIME: 202512171748
ISSUE DATE / TIME: 202512171748
ISSUE DATE / TIME: 202512171800
ISSUE DATE / TIME: 202512171800
ISSUE DATE / TIME: 202512171800
ISSUE DATE / TIME: 202512171800
ISSUE DATE / TIME: 202512181815
ISSUE DATE / TIME: 202512181941
ISSUE DATE / TIME: 202512191005
ISSUE DATE / TIME: 202601152359
ISSUE DATE / TIME: 202601152359
ISSUE DATE / TIME: 202601152359
ISSUE DATE / TIME: 202601152359
ISSUE DATE / TIME: 202601152359
PRODUCT CODE: 202512171632
PRODUCT CODE: 202512171632
PRODUCT CODE: 202512171632
PRODUCT CODE: 202512171632
PRODUCT CODE: 202512171632
PRODUCT CODE: 202512171632
PRODUCT CODE: 202512171632
PRODUCT CODE: 202512171800
PRODUCT CODE: 202512181436
PRODUCT CODE: 202601152359
PRODUCT CODE: 202601152359
PRODUCT CODE: 202601192359
PRODUCT CODE: 202601192359
PRODUCT CODE: 202601202359
PRODUCT CODE: 202601202359
PRODUCT CODE: 202601202359
PRODUCT CODE: 202601202359
PRODUCT CODE: 202601202359
PRODUCT CODE: 202601202359
Unit Type and Rh: 5100
Unit Type and Rh: 5100
Unit Type and Rh: 202512171632
Unit Type and Rh: 202512171632
Unit Type and Rh: 202512171632
Unit Type and Rh: 202512171632
Unit Type and Rh: 202512171632
Unit Type and Rh: 202512171632
Unit Type and Rh: 202512171632
Unit Type and Rh: 202512171632
Unit Type and Rh: 202512171800
Unit Type and Rh: 202512171800
Unit Type and Rh: 202601152359
Unit Type and Rh: 202601152359
Unit Type and Rh: 202601152359
Unit Type and Rh: 202601152359
Unit Type and Rh: 202601152359
Unit Type and Rh: 5100
Unit Type and Rh: 5100
Unit Type and Rh: 5100
Unit Type and Rh: 5100
Unit Type and Rh: 5100
Unit Type and Rh: 5100
Unit Type and Rh: 5100
Unit Type and Rh: 5100
Unit Type and Rh: 5100
Unit Type and Rh: 5100
Unit Type and Rh: 5100
Unit Type and Rh: 5100
Unit Type and Rh: 5100
Unit Type and Rh: 5100
Unit Type and Rh: 5100
Unit Type and Rh: 5100
Unit Type and Rh: 5100
Unit Type and Rh: 5100
Unit Type and Rh: 5100
Unit Type and Rh: 5100
Unit Type and Rh: 5100
Unit Type and Rh: 5100
Unit Type and Rh: 5100
Unit Type and Rh: 5100
Unit Type and Rh: 5100
Unit Type and Rh: 5100
Unit Type and Rh: 5100
Unit Type and Rh: 5100
Unit Type and Rh: 5100
Unit Type and Rh: 5100
Unit Type and Rh: 5100
Unit Type and Rh: 5100
Unit Type and Rh: 5100
Unit Type and Rh: 5100
Unit Type and Rh: 5100

## 2024-01-19 LAB — CBC
HCT: 48.7 % (ref 39.0–52.0)
Hemoglobin: 17.2 g/dL — ABNORMAL HIGH (ref 13.0–17.0)
MCH: 29.9 pg (ref 26.0–34.0)
MCHC: 35.3 g/dL (ref 30.0–36.0)
MCV: 84.7 fL (ref 80.0–100.0)
Platelets: 64 K/uL — ABNORMAL LOW (ref 150–400)
RBC: 5.75 MIL/uL (ref 4.22–5.81)
RDW: 16.9 % — ABNORMAL HIGH (ref 11.5–15.5)
WBC: 10.2 K/uL (ref 4.0–10.5)
nRBC: 0 % (ref 0.0–0.2)

## 2024-01-19 LAB — HEMOGLOBIN AND HEMATOCRIT, BLOOD
HCT: 49.1 % (ref 39.0–52.0)
HCT: 49.4 % (ref 39.0–52.0)
HCT: 48 % (ref 39.0–52.0)
HCT: 46.3 % (ref 39.0–52.0)
Hemoglobin: 17.3 g/dL — ABNORMAL HIGH (ref 13.0–17.0)
Hemoglobin: 16.9 g/dL (ref 13.0–17.0)
Hemoglobin: 16 g/dL (ref 13.0–17.0)
Hemoglobin: 15.7 g/dL (ref 13.0–17.0)

## 2024-01-19 LAB — POCT I-STAT 7, (LYTES, BLD GAS, ICA,H+H)
Acid-Base Excess: 1 mmol/L (ref 0.0–2.0)
Bicarbonate: 24.5 mmol/L (ref 20.0–28.0)
Calcium, Ion: 1.19 mmol/L (ref 1.15–1.40)
HCT: 47 % (ref 39.0–52.0)
Hemoglobin: 16 g/dL (ref 13.0–17.0)
O2 Saturation: 97 %
Patient temperature: 36.3
Potassium: 3.8 mmol/L (ref 3.5–5.1)
Sodium: 147 mmol/L — ABNORMAL HIGH (ref 135–145)
TCO2: 26 mmol/L (ref 22–32)
pCO2 arterial: 34.3 mmHg (ref 32–48)
pH, Arterial: 7.46 — ABNORMAL HIGH (ref 7.35–7.45)
pO2, Arterial: 84 mmHg (ref 83–108)

## 2024-01-19 LAB — BASIC METABOLIC PANEL WITH GFR
Anion gap: 13 (ref 5–15)
BUN: 61 mg/dL — ABNORMAL HIGH (ref 8–23)
CO2: 22 mmol/L (ref 22–32)
Calcium: 8.9 mg/dL (ref 8.9–10.3)
Chloride: 112 mmol/L — ABNORMAL HIGH (ref 98–111)
Creatinine, Ser: 1.8 mg/dL — ABNORMAL HIGH (ref 0.61–1.24)
GFR, Estimated: 40 mL/min — ABNORMAL LOW
Glucose, Bld: 121 mg/dL — ABNORMAL HIGH (ref 70–99)
Potassium: 3.6 mmol/L (ref 3.5–5.1)
Sodium: 147 mmol/L — ABNORMAL HIGH (ref 135–145)

## 2024-01-19 LAB — PREPARE CRYOPRECIPITATE
Unit division: 0
Unit division: 0

## 2024-01-19 LAB — GLUCOSE, CAPILLARY
Glucose-Capillary: 114 mg/dL — ABNORMAL HIGH (ref 70–99)
Glucose-Capillary: 109 mg/dL — ABNORMAL HIGH (ref 70–99)
Glucose-Capillary: 112 mg/dL — ABNORMAL HIGH (ref 70–99)
Glucose-Capillary: 113 mg/dL — ABNORMAL HIGH (ref 70–99)
Glucose-Capillary: 131 mg/dL — ABNORMAL HIGH (ref 70–99)
Glucose-Capillary: 99 mg/dL (ref 70–99)

## 2024-01-19 LAB — BPAM CRYOPRECIPITATE
Blood Product Expiration Date: 202512182359
ISSUE DATE / TIME: 202512172109
ISSUE DATE / TIME: 202512181401
ISSUE DATE / TIME: 202512202359
Unit Type and Rh: 202512182359
Unit Type and Rh: 202512202359
Unit Type and Rh: 5100
Unit Type and Rh: 6200

## 2024-01-19 LAB — PHOSPHORUS: Phosphorus: 5 mg/dL — ABNORMAL HIGH (ref 2.5–4.6)

## 2024-01-19 LAB — MAGNESIUM: Magnesium: 1.7 mg/dL (ref 1.7–2.4)

## 2024-01-19 MED ORDER — ONDANSETRON HCL 4 MG/2ML IJ SOLN
INTRAMUSCULAR | Status: AC
  Filled 2024-01-19: qty 2

## 2024-01-19 MED ORDER — SUGAMMADEX SODIUM 200 MG/2ML IV SOLN
INTRAVENOUS | Status: DC | PRN
  Administered 2024-01-19: 282.6 mg via INTRAVENOUS

## 2024-01-19 MED ORDER — VASOPRESSIN 20 UNIT/ML IV SOLN
INTRAVENOUS | Status: AC
  Filled 2024-01-19: qty 1

## 2024-01-19 MED ORDER — DEXTROSE 5 % IV SOLN: INTRAVENOUS | Status: AC

## 2024-01-19 MED ORDER — SODIUM CHLORIDE 0.9 % IV SOLN: INTRAVENOUS | Status: AC | PRN

## 2024-01-19 MED ORDER — HYDROMORPHONE HCL 1 MG/ML IJ SOLN
INTRAMUSCULAR | Status: DC | PRN
  Administered 2024-01-19: .5 mg via INTRAVENOUS

## 2024-01-19 MED ORDER — ONDANSETRON HCL 4 MG/2ML IJ SOLN
INTRAMUSCULAR | Status: DC | PRN
  Administered 2024-01-19: 4 mg via INTRAVENOUS

## 2024-01-19 MED ORDER — MAGNESIUM SULFATE 2 GM/50ML IV SOLN
2.0000 g | Freq: Once | INTRAVENOUS | Status: AC
  Administered 2024-01-19: 2 g via INTRAVENOUS
  Filled 2024-01-19: qty 50

## 2024-01-19 MED ORDER — POTASSIUM CHLORIDE 20 MEQ PO PACK: 40.0000 meq | PACK | Freq: Once | ORAL | Status: DC

## 2024-01-19 MED ORDER — ROCURONIUM BROMIDE 10 MG/ML (PF) SYRINGE
PREFILLED_SYRINGE | INTRAVENOUS | Status: AC
  Filled 2024-01-19: qty 10

## 2024-01-19 MED ORDER — SODIUM CHLORIDE 0.9 % IV SOLN: INTRAVENOUS | Status: DC | PRN

## 2024-01-19 MED ORDER — ROCURONIUM BROMIDE 10 MG/ML (PF) SYRINGE
PREFILLED_SYRINGE | INTRAVENOUS | Status: DC | PRN
  Administered 2024-01-19: 100 mg via INTRAVENOUS

## 2024-01-19 MED ORDER — 0.9 % SODIUM CHLORIDE (POUR BTL) OPTIME
TOPICAL | Status: DC | PRN
  Administered 2024-01-19: 1000 mL

## 2024-01-19 MED ORDER — POTASSIUM CHLORIDE 10 MEQ/50ML IV SOLN
10.0000 meq | INTRAVENOUS | Status: AC
  Administered 2024-01-19 (×4): 10 meq via INTRAVENOUS
  Filled 2024-01-19 (×3): qty 50

## 2024-01-19 MED ORDER — HEMOSTATIC AGENTS (NO CHARGE) OPTIME
TOPICAL | Status: DC | PRN
  Administered 2024-01-19 (×4): 1

## 2024-01-19 MED ORDER — HYDROMORPHONE HCL 1 MG/ML IJ SOLN
INTRAMUSCULAR | Status: AC
  Filled 2024-01-19: qty 0.5

## 2024-01-19 NOTE — Transfer of Care (Signed)
 Immediate Anesthesia Transfer of Care Note  Patient: Raymond Hurst  Procedure(s) Performed: LAPAROTOMY, EXPLORATORY (Abdomen)  Patient Location: ICU  Anesthesia Type:General  Level of Consciousness: Patient remains intubated per anesthesia plan  Airway & Oxygen Therapy: Patient remains intubated per anesthesia plan and Patient placed on Ventilator (see vital sign flow sheet for setting)  Post-op Assessment: Report given to RN and Post -op Vital signs reviewed and stable  Post vital signs: Reviewed and stable  Last Vitals:  Vitals Value Taken Time  BP 139/72   Temp 37.0   Pulse 65   Resp 15   SpO2 96     Last Pain:  Vitals:   01/19/24 0802  TempSrc: Bladder  PainSc:          Complications: No notable events documented.

## 2024-01-19 NOTE — Progress Notes (Signed)
 "  NAME:  Raymond Hurst, MRN:  [id number], DOB:  04-Nov-1950, LOS: 2 ADMISSION DATE:  01/16/2024, CONSULTATION DATE:  12/17 REFERRING MD:  Arlon CHIEF COMPLAINT:  GIB   History of Present Illness:  73 year old male with past medical history of atrial fibrillation on Eliquis , stroke, OSA on CPAP, COPD, history of GIB 2/2 gastric ulcer who presented to [facility] ED with hematemesis.   He presented 12/16 with new hematemesis after restarting Eliquis . Had been admitted 01/06/24-01/08/24 with bloody stool found to have gastric ulcer. He was cleared by GI to restart Eliquis . He had episode at home of dizziness, hematemesis. In ED had initial systolics in 80s and given IVF, 1U PRBC, Kcentra , PPI. GI was reconsulted. 12/17 had ongoing blood loss anemia without recurrent hematemesis and was given additional 2U  PRBC. EGD showed duodenal active bleed which was clipped. He was intubated during the procedure and admitted to ICU after for ongoing care. Recommended that he transfer to [facility] for IR consult.   Pertinent  Medical History  atrial fibrillation on Eliquis , stroke, OSA on CPAP, COPD, history of GIB 2/2 gastric ulcer, Left Upper Lobe squamous cell carcinoma s/p chemo  Significant Hospital Events: Including procedures, antibiotic start and stop dates in addition to other pertinent events   12/16: admit [facility] for hematemesis  12/17: EGD with duodenal bleed clipped, intubated > transfer [facility] for IR eval. Not an IR candidate. Underwent massive transfusion protocol. Taken to OR by gen surg for emergent surgery. S/p ex-lap duodenotomy and ligation of gastroduodenal artery.     Interim History / Subjective:   Plans to return to the OR today Remains sedated No acute events overnight   Objective   Blood pressure 134/70, pulse (!) 51, temperature (!) 97.3 F (36.3 C), temperature source Bladder, resp. rate (!) 24, height 6' (1.829 m), weight (!) 141.3 kg, SpO2 95%.    Vent Mode: PRVC FiO2 (%):  [40 %] 40 % Set  Rate:  [24 bmp-28 bmp] 24 bmp Vt Set:  [620 mL] 620 mL PEEP:  [12 cmH20] 12 cmH20 Plateau Pressure:  [24 cmH20-27 cmH20] 25 cmH20   Intake/Output Summary (Last 24 hours) at 01/19/2024 0750 Last data filed at 01/19/2024 9365 Gross per 24 hour  Intake 1149.34 ml  Output 2415 ml  Net -1265.66 ml   Filed Weights   01/16/24 1620 01/19/24 0600  Weight: 119.7 kg (!) 141.3 kg    Examination: General: ill appearing, intubated, sedated HENT: moist mucous membranes, ETT in place Lungs: clear breath sounds bilaterally, no wheezing Cardiovascular: bradycardic irregular  Abdomen: distended, midline wound vac in place Extremities: chronic lymphedema with superimposed 2+ edema Neuro: PERRL, sedated but awakens to verbal stimuli, following commands, moving all extremities GU: foley in place  Labs reviewed Cr 1.8 Plts 64   Resolved Hospital Problem list    Assessment & Plan:  Hemorrhagic shock 2/2 acute UGIB - duodenal s/p clip  Recent history of gastric ulcer bleed  Thrombocytopenia - s/p massive transfusion protocol 12/17 and Kcentra , TXA and factor 7. 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  See PMH above for treatment hx.  Nutrition - NPO  Labs   CBC: Recent Labs  Lab 01/12/24 1048 01/12/24 1048 01/16/24 1623 01/16/24 1708 01/17/24 0441  01/17/24 1407 01/17/24 2347 01/18/24 0230 01/18/24 0425 01/18/24 0439 01/18/24 0701 01/18/24 1244 01/18/24 1451 01/18/24 1653 01/18/24 2038 01/19/24 0015 01/19/24 0324  WBC 6.4  --  6.8  --  4.8  --  10.0  --  11.1*  --   --   --   --   --   --   --  10.2  NEUTROABS 5.2  --  5.4  --   --   --  9.0*  --   --   --   --   --   --   --   --   --   --   HGB 10.2*  --  7.7*   < > 7.0*   < > 17.7*   < > 17.5*   < >  --    < > 16.3 17.2* 17.4* 17.3* 17.2*  HCT 32.5*  --  25.4*   < > 22.1*   < > 50.4   < > 48.2   < >  --    < > 48.0 48.2 48.6 49.1 48.7  MCV 83.5  --  87.3  --  86.7  --  84.0  --  83.5  --   --   --   --   --   --   --  84.7  PLT 225   < > 257  --  191  --  67*  --  67*  --  65*  --   --   --   --   --  64*   < > = values in this interval not displayed.    Basic Metabolic Panel: Recent Labs  Lab 01/16/24 1623 01/16/24 1708 01/17/24 0441 01/17/24 1755 01/17/24 2347 01/18/24 0230 01/18/24 0425 01/18/24 0439 01/18/24 1446 01/18/24 1451 01/19/24 0324  NA 137   < > 140   < > 145   < > 145 145 146* 145 147*  K 3.6   < > 4.1   < > 3.7   < > 3.7 3.6 3.7 3.7 3.6  CL 103   < > 108  --  110  --  111  --   --  112* 112*  CO2 19*  --  22  --  18*  --  21*  --   --   --  22  GLUCOSE 178*   < > 87  --  174*  --  156*  --   --  128* 121*  BUN 28*   < > 31*  --  32*  --  37*  --   --  46* 61*  CREATININE 0.79   < > 0.66  --  1.11  --  1.28*  --   --  1.70* 1.80*  CALCIUM  8.4*  --  8.1*  --  10.4*  --  10.1  --   --   --  8.9  MG  --   --   --   --   --   --  1.5*  --   --   --  1.7  PHOS  --   --   --   --   --   --   --   --   --   --  5.0*   < > = values in this interval not displayed.   GFR: Estimated Creatinine Clearance: 54.9 mL/min (A) (by C-G formula based on SCr of 1.8 mg/dL (H)). Recent Labs  Lab 01/17/24 0441 01/17/24 2347 01/18/24 0425 01/19/24 0324  WBC 4.8 10.0 11.1* 10.2  LATICACIDVEN  --  5.9* 4.1*  --     Liver Function Tests: Recent Labs  Lab  01/12/24 1048 01/16/24 1623  AST 14* 13*  ALT 5 6  ALKPHOS 66 49  BILITOT 0.5 0.6  PROT 6.7 5.7*  ALBUMIN 3.6 3.5   No results for input(s): LIPASE, AMYLASE in the last 168 hours. No results for input(s): AMMONIA in the last 168 hours.  ABG    Component Value Date/Time   PHART 7.485 (H) 01/18/2024 1446   PCO2ART 29.8 (L) 01/18/2024 1446   PO2ART 94 01/18/2024 1446   HCO3 22.5 01/18/2024 1446   TCO2 21 (L) 01/18/2024 1451   ACIDBASEDEF 1.0 01/18/2024 0439   O2SAT 98 01/18/2024 1446     Coagulation Profile: Recent Labs  Lab 01/18/24 0701 01/18/24 0702  INR 1.1 1.0    Cardiac Enzymes: No results for input(s): CKTOTAL, CKMB, CKMBINDEX, TROPONINI in the last 168 hours.  HbA1C: Hgb A1c MFr Bld  Date/Time Value Ref Range Status  01/17/2024 11:47 PM 5.5 4.8 - 5.6 % Final    Comment:    (NOTE) Diagnosis of Diabetes The following HbA1c ranges recommended by the American Diabetes Association (ADA) may be used as an aid in the diagnosis of diabetes mellitus.  Hemoglobin             Suggested A1C NGSP%              Diagnosis  <5.7                   Non Diabetic  5.7-6.4                Pre-Diabetic  >6.4                   Diabetic  <7.0                   Glycemic control for                       adults with diabetes.    06/08/2018 12:25 AM 5.2 4.8 - 5.6 % Final    Comment:    (NOTE) Pre diabetes:          5.7%-6.4% Diabetes:              >6.4% Glycemic control for   <7.0% adults with diabetes     CBG: Recent Labs  Lab 01/18/24 1158 01/18/24 1522 01/18/24 2007 01/18/24 2342 01/19/24 0333  GLUCAP 135* 133* 125* 132* 114*       Critical care time: 35 mins    The patient is critically ill with multiple organ system failure and requires high complexity decision making for assessment and support, frequent evaluation and titration of therapies, advanced monitoring, review of radiographic studies and interpretation of complex data.      Dorn Chill, MD [facility] Office: [phone]   See Amion for personal pager PCCM on call pager [phone] until 7pm.  Please call Elink 7p-7a. [phone]      "

## 2024-01-19 NOTE — Op Note (Signed)
 Preoperative diagnosis: Open abdomen status post suture ligation of hemorrhaging duodenal ulcer Postoperative diagnosis: Same as above Procedure: 1.  Reopening of recent laparotomy 2.  Abdominal wall closure Surgeon: Dr. Adina Bury Anesthesia: General Estimated blood loss: Less than 50 cc Specimens: None Drains: 19 French Blake drain over duodenal repair Complications: None Disposition to ICU in critical condition  Indications: This is 73 year old male in hemorrhagic shock he was taken to the operating room 2 days ago.  He underwent a duodenotomy with ligation of the GDA and its branches followed by a wound VAC placement with an open abdomen due to the fact that he was quite ill.  He has not received blood since then.  He is off pressors.  He is slowly improved in the ICU.  I discussed with his wife returning to the operating room today for an attempt at closure and to ensure the duodenal repair was intact.  Procedure: After after informed consent was obtained from his wife we took him to the operating room.  He was placed under general anesthesia.  He was already on antibiotics.  The VAC was removed.  He was then prepped and draped in a standard sterile surgical fashion.  A surgical timeout was then performed.  I evaluated his abdomen.  There was no succus present.  I remove the 1 sponge that it remained from the prior surgery as well as the hemostatic agent.  This area in the retroperitoneum was not really bleeding.  The liver capsule was bleeding from above it.  I then took down the falciform ligament after ligating it with 2-0 silk suture.  I did cauterize some of the liver and placed some Surgicel snow in this area.  This appeared hemostatic upon completion.  The duodenal repair was intact there was no drainage from it at all.  The remainder the abdomen was clear.  I then placed a 24 French Blake drain into the right upper quadrant and secured it with a 2-0 nylon suture.  I then closed the  abdomen with #1 looped PDS and some intermittent #1 Novafil's.  We did an x-ray prior to closure to ensure no sponges and this was confirmed by radiology.  We then placed a dressing.  He tolerated this well and was transferred back to the ICU.

## 2024-01-19 NOTE — Anesthesia Preprocedure Evaluation (Signed)
"                                    Anesthesia Evaluation  Patient identified by MRN, date of birth, ID band Patient awake    Reviewed: Allergy & Precautions, H&P , NPO status , Patient's Chart, lab work & pertinent test results  Airway Mallampati: Intubated       Dental no notable dental hx. (+) Teeth Intact, Dental Advisory Given   Pulmonary sleep apnea , COPD, former smoker Intubated and sedated   Pulmonary exam normal breath sounds clear to auscultation       Cardiovascular + DOE  negative cardio ROS  Rhythm:Regular Rate:Normal     Neuro/Psych TIACVA  negative psych ROS   GI/Hepatic negative GI ROS, Neg liver ROS,,,  Endo/Other    Class 3 obesity  Renal/GU negative Renal ROS  negative genitourinary   Musculoskeletal   Abdominal   Peds  Hematology  (+) Blood dyscrasia, anemia   Anesthesia Other Findings   Reproductive/Obstetrics negative OB ROS                              Anesthesia Physical Anesthesia Plan  ASA: 4  Anesthesia Plan: General   Post-op Pain Management: Minimal or no pain anticipated   Induction: Intravenous  PONV Risk Score and Plan: 2 and Midazolam  and Ondansetron   Airway Management Planned: Oral ETT  Additional Equipment:   Intra-op Plan:   Post-operative Plan: Post-operative intubation/ventilation  Informed Consent: I have reviewed the patients History and Physical, chart, labs and discussed the procedure including the risks, benefits and alternatives for the proposed anesthesia with the patient or authorized representative who has indicated his/her understanding and acceptance.     Dental advisory given  Plan Discussed with: CRNA  Anesthesia Plan Comments:         Anesthesia Quick Evaluation  "

## 2024-01-19 NOTE — Progress Notes (Signed)
 2 Days Post-Op   Subjective/Chief Complaint: Intubated sedated   Objective: Vital signs in last 24 hours: Temp:  [97.3 F (36.3 C)-98.8 F (37.1 C)] 97.5 F (36.4 C) (12/19 0900) Pulse Rate:  [46-74] 62 (12/19 0900) Resp:  [0-28] 0 (12/19 0900) BP: (106-134)/(66-80) 125/72 (12/19 0800) SpO2:  [92 %-99 %] 95 % (12/19 0900) Arterial Line BP: (119-155)/(61-86) 139/70 (12/19 0900) FiO2 (%):  [40 %] 40 % (12/19 0807) Weight:  [141.3 kg] 141.3 kg (12/19 0600) Last BM Date : 01/19/24  Intake/Output from previous day: 12/18 0701 - 12/19 0700 In: 1302.9 [I.V.:576; NG/GT:30; IV Piggyback:676.9] Out: 2415 [Urine:1045; Emesis/NG output:50; Drains:1320] Intake/Output this shift: Total I/O In: 173.4 [I.V.:44; IV Piggyback:129.4] Out: 180 [Urine:180]  Ab vac in place with serous drainage  Lab Results:  Recent Labs    01/18/24 0425 01/18/24 0439 01/18/24 0701 01/18/24 1244 01/19/24 0015 01/19/24 0324  WBC 11.1*  --   --   --   --  10.2  HGB 17.5*   < >  --    < > 17.3* 17.2*  HCT 48.2   < >  --    < > 49.1 48.7  PLT 67*  --  65*  --   --  64*   < > = values in this interval not displayed.   BMET Recent Labs    01/18/24 0425 01/18/24 0439 01/18/24 1451 01/19/24 0324  NA 145   < > 145 147*  K 3.7   < > 3.7 3.6  CL 111  --  112* 112*  CO2 21*  --   --  22  GLUCOSE 156*  --  128* 121*  BUN 37*  --  46* 61*  CREATININE 1.28*  --  1.70* 1.80*  CALCIUM  10.1  --   --  8.9   < > = values in this interval not displayed.   PT/INR Recent Labs    01/18/24 0701 01/18/24 0702  LABPROT 15.2 14.1  INR 1.1 1.0   ABG Recent Labs    01/18/24 0439 01/18/24 1446  PHART 7.454* 7.485*  HCO3 21.1 22.5    Studies/Results: DG Chest Port 1 View Result Date: 01/18/2024 CLINICAL DATA:  Endotracheal tube placement EXAM: PORTABLE CHEST 1 VIEW COMPARISON:  Yesterday FINDINGS: Stable cardiomegaly. Endotracheal tube is in grossly good position. Nasogastric tube is seen entering  stomach. Right internal jugular catheter is unchanged. Increased left apical opacity is noted concerning for worsening infiltrate and possible associated loculated pleural effusion. Stable left basilar atelectasis is noted with small pleural effusion. IMPRESSION: Endotracheal tube is in grossly good position. Increased left apical opacity is noted concerning for worsening infiltrate and possible associated loculated pleural effusion. Electronically Signed   By: Lynwood Landy Raddle M.D.   On: 01/18/2024 08:58   DG Chest Port 1 View Result Date: 01/17/2024 EXAM: 1 VIEW(S) XRAY OF THE CHEST 01/17/2024 03:06:00 PM COMPARISON: 01/17/2024 CLINICAL HISTORY: Post central line placement. FINDINGS: LINES, TUBES AND DEVICES: Endotracheal tube present with tip measuring 4.4 cm above the carina. Right central venous catheter with tip over the cavoatrial junction region. LUNGS AND PLEURA: Shallow inspiration. Probable bilateral pleural effusions with infiltration or atelectasis in the left lung base. Left basilar infiltrates are somewhat improved since previous study. No pneumothorax. HEART AND MEDIASTINUM: Cardiac enlargement. BONES AND SOFT TISSUES: Degenerative changes in the spine. No acute osseous abnormality. IMPRESSION: 1. Right central venous catheter tip projects at the cavoatrial junction region, with no pneumothorax. 2. Endotracheal tube tip projects  4.4 cm above the carina. 3. Probable bilateral pleural effusions and left basilar infiltrates, somewhat improved since previous study. 4. Cardiac enlargement. Electronically signed by: Elsie Gravely MD 01/17/2024 03:25 PM EST RP Workstation: [id number]   DG CHEST PORT 1 VIEW Result Date: 01/17/2024 EXAM: 1 VIEW(S) XRAY OF THE CHEST 01/17/2024 11:39:00 AM COMPARISON: 10/31/2023 CLINICAL HISTORY: Endotracheally intubated FINDINGS: LINES, TUBES AND DEVICES: Endotracheal tube in place, 3.9 cm above the carina. LUNGS AND PLEURA: Dense opacity in left mid to lower lung.  Suspected left pleural effusion. Mild atelectasis within right lung. Mild pulmonary edema. No pneumothorax. HEART AND MEDIASTINUM: Cardiomegaly. BONES AND SOFT TISSUES: No acute osseous abnormality. IMPRESSION: 1. Dense opacity in left mid to lower lung, suspected left pleural effusion, and mild atelectasis within right lung. 2. Mild pulmonary edema. 3. Cardiomegaly. Electronically signed by: Evalene Coho MD 01/17/2024 12:21 PM EST RP Workstation: [id number]    Anti-infectives: Anti-infectives (From admission, onward)    Start     Dose/Rate Route Frequency Ordered Stop   01/17/24 2245  piperacillin -tazobactam (ZOSYN ) IVPB 3.375 g        3.375 g 12.5 mL/hr over 240 Minutes Intravenous Every 8 hours 01/17/24 2149     01/17/24 2200  fluconazole  (DIFLUCAN ) IVPB 400 mg        400 mg 100 mL/hr over 120 Minutes Intravenous Every 24 hours 01/17/24 2059         Assessment/Plan: POD 2 elap/ligation bleeding du/open abdomen -return to OR today, off pressors no more blood -discussed plan with wife  Raymond Hurst 01/19/2024

## 2024-01-19 NOTE — Progress Notes (Signed)
 Initial Nutrition Assessment  DOCUMENTATION CODES:  Not applicable  INTERVENTION:  Monitor plan of care and clinical status If not extubated and enteral nutrition indicated: Recommend starting trickle tube feeds- Vital 1.5 at 46ml/hr Advance by 10ml q12h as tolerated to goal rate of 66ml/hr ( per day) 60ml ProSource TF20 once daily Provides 2240 kcal, 117g protein, 1100ml free water  daily If unable to resume oral diet or initiate enteral nutrition, recommend initiation of TPN within 3-5 days  NUTRITION DIAGNOSIS:  Increased nutrient needs related to acute illness, post-op healing as evidenced by estimated needs  GOAL:  Patient will meet greater than or equal to 90% of their needs  MONITOR:  Labs, Vent status, Weight trends, Skin, I & O's  REASON FOR ASSESSMENT:  Ventilator    ASSESSMENT:  Pt admitted with hematemesis r/t duodenal hemorrhage. PMH significant for COPD, afib on Eliquis , CVA, history of GI bleed d/t gastric ulcer, chronic lymphedema, non small cell lung cancer on monthly chemo. Recently admitted 12/6-12/8 with bloody stools r/t clean-based gastric ulcer without signs of active bleeding.   12/16 - admitted w/hematemesis 12/17- upper GI endoscopy- normal esophagus, red blood in entire stomach, blood in duodenal bulb and second portion of the duodenum, s/p clipping; remains intubated; exlap, duodenotomy, ligation of GDA and branches, ABThera wound VAC placement 12/19 - return to OR- reopening of laparotomy; abdominal wall closure  Patient is currently intubated on ventilator support MV: 13 L/min Temp (24hrs), Avg:97.5 F (36.4 C), Min:96.4 F (35.8 C), Max:98.1 F (36.7 C)  Pt is s/p massive transfusion protocol. In hemorrhagic shock.  Of unit in OR at time of visit.  Spoke with patients sister present in room awaiting his return. She mentions that he was eating well up until this admission. Did not report any significant changes to his typical intake.  Additional nutrition related history is limited.   Pt has been off pressors.  No further requirement for blood infusions.   Pt remains NPO post operatively.  Will monitor Surgery assessment/plan and adjust nutrition interventions as appropriate.   Admit weight: 119.7 kg Current weight of 141.3 kg not likely accurate and elevated secondary to acute illness, IVF and edema.  Weight documentation PTA reflects variability within short time frames. Highest weight PTA was 125.9 kg on 09/16 and has been declining. Lowest weight documented to be 117 kg on 12/4.   + edema: moderate pitting generalized, BUE, BLE  Drains/lines:  NGT (tip enters stomach) 50ml output x24 hours ABThera wound VAC: output x24 hours Right internal jugular; triple lumen CVC Left femoral a line  Medications: SSI 0-9 units q4h Drips: D5 @ 75ml/hr abx  Labs:  Sodium 147 Chloride 112 BUN  61 Cr 1.80 Phosphorus 5.0 GFR 40 CBG's 109-135 x24 hours  NUTRITION - FOCUSED PHYSICAL EXAM: Pt off unit. Deferred to follow up.   Diet Order:   Diet Order             Diet NPO time specified  Diet effective now                   EDUCATION NEEDS:   No education needs have been identified at this time  Skin:  Skin Assessment: Skin Integrity Issues: Skin Integrity Issues:: Wound VAC Wound Vac: abdomen  Last BM:  12/19 type 7 small, red  Height:   Ht Readings from Last 1 Encounters:  01/17/24 6' (1.829 m)    Weight:   Wt Readings from Last 1 Encounters:  01/19/24 ROLLEN)  141.3 kg    Ideal Body Weight:  80.9 kg  BMI:  Body mass index is 42.25 kg/m.  Estimated Nutritional Needs:   Kcal:  2100-2300  Protein:  115-130g  Fluid:  >/=2L  Raymond Hurst, RDN, LDN Clinical Nutrition See AMiON for contact information.

## 2024-01-19 NOTE — Procedures (Signed)
 Arterial Line Insertion Start/End12/19/2025 4:15 PM  Patient location: ICU. Preanesthetic checklist: patient identified, IV checked, site marked, monitors and equipment checked, pre-op evaluation and timeout performed Right, radial was placed Catheter size: 20 G Maximum sterile barriers used   Attempts: 1 Following insertion, dressing applied and Biopatch. Post procedure assessment: normal and unchanged  Post procedure complications: second provider assisted. Patient tolerated the procedure well with no immediate complications.

## 2024-01-19 NOTE — Anesthesia Postprocedure Evaluation (Signed)
"   Anesthesia Post Note  Patient: Raymond Hurst  Procedure(s) Performed: LAPAROTOMY, EXPLORATORY (Abdomen)     Patient location during evaluation: SICU Anesthesia Type: General Level of consciousness: sedated Pain management: pain level controlled Vital Signs Assessment: post-procedure vital signs reviewed and stable Respiratory status: patient remains intubated per anesthesia plan Cardiovascular status: stable Postop Assessment: no apparent nausea or vomiting Anesthetic complications: no   No notable events documented.  Last Vitals:  Vitals:   01/19/24 1100 01/19/24 1115  BP:    Pulse: 61 72  Resp: 18 13  Temp: (!) 36.3 C   SpO2: 97% 99%    Last Pain:  Vitals:   01/19/24 0802  TempSrc: Bladder  PainSc:                  Raymond Hurst,W. EDMOND      "

## 2024-01-19 NOTE — Progress Notes (Signed)
 [facility] ADULT ICU REPLACEMENT PROTOCOL   The patient does apply for the [facility] Adult ICU Electrolyte Replacment Protocol based on the criteria listed below:   1.Exclusion criteria: TCTS, ECMO, Dialysis, and Myasthenia Gravis patients 2. Is GFR >/= 30 ml/min? Yes.    Patient's GFR today is 40 3. Is SCr </= 2? Yes.   Patient's SCr is 1.80 mg/dL 4. Did SCr increase >/= 0.5 in 24 hours? No. 5.Pt's weight >40kg  Yes.   6. Abnormal electrolyte(s): K+ = 3.6, Mg = 1.7  7. Electrolytes replaced per protocol 8.  Call MD STAT for K+ </= 2.5, Phos </= 1, or Mag </= 1 Physician:  Epimenio, eMD   Raymond Hurst 01/19/2024 5:38 AM

## 2024-01-19 NOTE — Plan of Care (Signed)
 Patient remained bradycardic overnight to high 30s- afib, BP stable off pressors. On sedation. Responds to voice, following commands, nodding head appropriately and able to wiggle toes.  Afebrile. Generalized pitting and weeping edema.  Approx 500 OP from wound vac serosanguinous 50 bloody OP for OG X2 BM small, red/brown Hgb stable on labs.   Problem: Clinical Measurements: Goal: Will remain free from infection Outcome: Progressing   Problem: Clinical Measurements: Goal: Cardiovascular complication will be avoided Outcome: Progressing   Problem: Elimination: Goal: Will not experience complications related to bowel motility Outcome: Progressing

## 2024-01-19 NOTE — OR Nursing (Signed)
 Retained laparotomy sponge from patient abdomen removed by Dr. Ebbie in OR. X-ray results confirmed by Davina Salvage.

## 2024-01-20 ENCOUNTER — Inpatient Hospital Stay (HOSPITAL_COMMUNITY)

## 2024-01-20 ENCOUNTER — Other Ambulatory Visit: Payer: Self-pay | Admitting: Internal Medicine

## 2024-01-20 DIAGNOSIS — Z9889 Other specified postprocedural states: Secondary | ICD-10-CM | POA: Diagnosis not present

## 2024-01-20 DIAGNOSIS — R578 Other shock: Secondary | ICD-10-CM | POA: Diagnosis not present

## 2024-01-20 DIAGNOSIS — K922 Gastrointestinal hemorrhage, unspecified: Secondary | ICD-10-CM | POA: Diagnosis not present

## 2024-01-20 DIAGNOSIS — I4891 Unspecified atrial fibrillation: Secondary | ICD-10-CM | POA: Diagnosis not present

## 2024-01-20 DIAGNOSIS — E872 Acidosis, unspecified: Secondary | ICD-10-CM | POA: Diagnosis not present

## 2024-01-20 DIAGNOSIS — D696 Thrombocytopenia, unspecified: Secondary | ICD-10-CM | POA: Diagnosis not present

## 2024-01-20 DIAGNOSIS — Z8673 Personal history of transient ischemic attack (TIA), and cerebral infarction without residual deficits: Secondary | ICD-10-CM | POA: Diagnosis not present

## 2024-01-20 DIAGNOSIS — K264 Chronic or unspecified duodenal ulcer with hemorrhage: Secondary | ICD-10-CM

## 2024-01-20 DIAGNOSIS — J449 Chronic obstructive pulmonary disease, unspecified: Secondary | ICD-10-CM | POA: Diagnosis not present

## 2024-01-20 DIAGNOSIS — G4733 Obstructive sleep apnea (adult) (pediatric): Secondary | ICD-10-CM | POA: Diagnosis not present

## 2024-01-20 DIAGNOSIS — Z8711 Personal history of peptic ulcer disease: Secondary | ICD-10-CM | POA: Diagnosis not present

## 2024-01-20 DIAGNOSIS — N179 Acute kidney failure, unspecified: Secondary | ICD-10-CM | POA: Diagnosis not present

## 2024-01-20 LAB — BPAM RBC
Blood Product Expiration Date: 202512312359
Blood Product Expiration Date: 202601052359
Blood Product Expiration Date: 202601052359
Blood Product Expiration Date: 202601052359
Blood Product Expiration Date: 202601052359
Blood Product Expiration Date: 202601102359
Blood Product Expiration Date: 202601132359
ISSUE DATE / TIME: 202512161737
ISSUE DATE / TIME: 202512170839
ISSUE DATE / TIME: 202512171033
ISSUE DATE / TIME: 202512171510
ISSUE DATE / TIME: 202512171510
Unit Type and Rh: 9500
Unit Type and Rh: 9500
Unit Type and Rh: 9500
Unit Type and Rh: 9500
Unit Type and Rh: 9500
Unit Type and Rh: 9500
Unit Type and Rh: 9500

## 2024-01-20 LAB — TYPE AND SCREEN
ABO/RH(D): O NEG
Antibody Screen: NEGATIVE
Unit division: 0
Unit division: 0
Unit division: 0
Unit division: 0
Unit division: 0
Unit division: 0
Unit division: 0

## 2024-01-20 LAB — APTT: aPTT: 29 s (ref 24–36)

## 2024-01-20 LAB — POCT I-STAT 7, (LYTES, BLD GAS, ICA,H+H)
Acid-Base Excess: 0 mmol/L (ref 0.0–2.0)
Bicarbonate: 24.9 mmol/L (ref 20.0–28.0)
Calcium, Ion: 1.18 mmol/L (ref 1.15–1.40)
HCT: 45 % (ref 39.0–52.0)
Hemoglobin: 15.3 g/dL (ref 13.0–17.0)
O2 Saturation: 99 %
Patient temperature: 37.6
Potassium: 3.4 mmol/L — ABNORMAL LOW (ref 3.5–5.1)
Sodium: 146 mmol/L — ABNORMAL HIGH (ref 135–145)
TCO2: 26 mmol/L (ref 22–32)
pCO2 arterial: 43.3 mmHg (ref 32–48)
pH, Arterial: 7.37 (ref 7.35–7.45)
pO2, Arterial: 139 mmHg — ABNORMAL HIGH (ref 83–108)

## 2024-01-20 LAB — GLUCOSE, CAPILLARY
Glucose-Capillary: 85 mg/dL (ref 70–99)
Glucose-Capillary: 88 mg/dL (ref 70–99)
Glucose-Capillary: 90 mg/dL (ref 70–99)
Glucose-Capillary: 92 mg/dL (ref 70–99)
Glucose-Capillary: 96 mg/dL (ref 70–99)
Glucose-Capillary: 99 mg/dL (ref 70–99)

## 2024-01-20 LAB — CBC
HCT: 47.1 % (ref 39.0–52.0)
Hemoglobin: 15.9 g/dL (ref 13.0–17.0)
MCH: 29.9 pg (ref 26.0–34.0)
MCHC: 33.8 g/dL (ref 30.0–36.0)
MCV: 88.7 fL (ref 80.0–100.0)
Platelets: 54 K/uL — ABNORMAL LOW (ref 150–400)
RBC: 5.31 MIL/uL (ref 4.22–5.81)
RDW: 17.5 % — ABNORMAL HIGH (ref 11.5–15.5)
WBC: 7.3 K/uL (ref 4.0–10.5)
nRBC: 0 % (ref 0.0–0.2)

## 2024-01-20 LAB — MAGNESIUM: Magnesium: 2 mg/dL (ref 1.7–2.4)

## 2024-01-20 LAB — BASIC METABOLIC PANEL WITH GFR
Anion gap: 10 (ref 5–15)
BUN: 65 mg/dL — ABNORMAL HIGH (ref 8–23)
CO2: 25 mmol/L (ref 22–32)
Calcium: 8.4 mg/dL — ABNORMAL LOW (ref 8.9–10.3)
Chloride: 112 mmol/L — ABNORMAL HIGH (ref 98–111)
Creatinine, Ser: 1.9 mg/dL — ABNORMAL HIGH (ref 0.61–1.24)
GFR, Estimated: 37 mL/min — ABNORMAL LOW
Glucose, Bld: 100 mg/dL — ABNORMAL HIGH (ref 70–99)
Potassium: 3.8 mmol/L (ref 3.5–5.1)
Sodium: 146 mmol/L — ABNORMAL HIGH (ref 135–145)

## 2024-01-20 LAB — PLATELET COUNT: Platelets: 52 K/uL — ABNORMAL LOW (ref 150–400)

## 2024-01-20 LAB — HEMOGLOBIN AND HEMATOCRIT, BLOOD
HCT: 46.4 % (ref 39.0–52.0)
HCT: 47.7 % (ref 39.0–52.0)
HCT: 48.4 % (ref 39.0–52.0)
HCT: 48.8 % (ref 39.0–52.0)
HCT: 49.5 % (ref 39.0–52.0)
Hemoglobin: 15.7 g/dL (ref 13.0–17.0)
Hemoglobin: 16 g/dL (ref 13.0–17.0)
Hemoglobin: 16.1 g/dL (ref 13.0–17.0)
Hemoglobin: 16.1 g/dL (ref 13.0–17.0)
Hemoglobin: 16.3 g/dL (ref 13.0–17.0)

## 2024-01-20 LAB — PROTIME-INR
INR: 1.2 (ref 0.8–1.2)
Prothrombin Time: 16.3 s — ABNORMAL HIGH (ref 11.4–15.2)

## 2024-01-20 LAB — PHOSPHORUS: Phosphorus: 4.4 mg/dL (ref 2.5–4.6)

## 2024-01-20 LAB — FIBRINOGEN: Fibrinogen: 354 mg/dL (ref 210–475)

## 2024-01-20 MED ORDER — POTASSIUM CHLORIDE 10 MEQ/50ML IV SOLN
10.0000 meq | INTRAVENOUS | Status: AC
  Administered 2024-01-20 (×4): 10 meq via INTRAVENOUS
  Filled 2024-01-20 (×4): qty 50

## 2024-01-20 NOTE — Progress Notes (Addendum)
 [facility] Progress Note  1 Day Post-Op  Subjective: CC:  Alert, FC on vent, SBTs  TMAX 99.9 Hgb stable at 15.9, plts 54  Objective: Vital signs in last 24 hours: Temp:  [96.4 F (35.8 C)-99.9 F (37.7 C)] 99.9 F (37.7 C) (12/20 0845) Pulse Rate:  [61-95] 94 (12/20 0845) Resp:  [13-19] 18 (12/20 0845) BP: (90-121)/(52-85) 119/77 (12/20 0758) SpO2:  [93 %-100 %] 97 % (12/20 0845) Arterial Line BP: (99-149)/(50-81) 149/78 (12/20 0845) FiO2 (%):  [40 %] 40 % (12/20 0800) Last BM Date : 01/19/24  Intake/Output from previous day: 12/19 0701 - 12/20 0700 In: 2775.8 [I.V.:2229.4; NG/GT:10; IV Piggyback:536.3] Out: 2634 [Urine:1941; Emesis/NG output:275; Drains:408; Blood:10] Intake/Output this shift: Total I/O In: 108.3 [I.V.:95.8; IV Piggyback:12.5] Out: 475 [Urine:225; Drains:250]  PE: Gen:  Alert, NAD Card:  Regular rate and rhythm Pulm:  on vent Abd: Soft, protuberant, midline incision c/d/I   R JP drain (sitting near duodenal repair) - bloody, 408 overnight, 250 mL so far this morning Skin: warm and dry, no rashes, edematous  Lab Results:  Recent Labs    01/19/24 0324 01/19/24 0910 01/20/24 0355 01/20/24 0802  WBC 10.2  --  7.3  --   HGB 17.2*   < > 15.9 16.0  HCT 48.7   < > 47.1 47.7  PLT 64*  --  54*  --    < > = values in this interval not displayed.   BMET Recent Labs    01/19/24 0324 01/19/24 0910 01/20/24 0355  NA 147* 147* 146*  K 3.6 3.8 3.8  CL 112*  --  112*  CO2 22  --  25  GLUCOSE 121*  --  100*  BUN 61*  --  65*  CREATININE 1.80*  --  1.90*  CALCIUM  8.9  --  8.4*   PT/INR Recent Labs    01/18/24 0701 01/18/24 0702  LABPROT 15.2 14.1  INR 1.1 1.0   CMP     Component Value Date/Time   NA 146 (H) 01/20/2024 0355   NA 137 06/01/2023 1439   K 3.8 01/20/2024 0355   CL 112 (H) 01/20/2024 0355   CO2 25 01/20/2024 0355   GLUCOSE 100 (H) 01/20/2024 0355   BUN 65 (H) 01/20/2024 0355   BUN 9 06/01/2023 1439    CREATININE 1.90 (H) 01/20/2024 0355   CREATININE 0.91 01/12/2024 1048   CALCIUM  8.4 (L) 01/20/2024 0355   PROT 5.7 (L) 01/16/2024 1623   PROT 6.2 06/01/2023 1439   ALBUMIN 3.5 01/16/2024 1623   ALBUMIN 3.0 (L) 06/01/2023 1439   AST 13 (L) 01/16/2024 1623   AST 14 (L) 01/12/2024 1048   ALT 6 01/16/2024 1623   ALT 5 01/12/2024 1048   ALKPHOS 49 01/16/2024 1623   BILITOT 0.6 01/16/2024 1623   BILITOT 0.5 01/12/2024 1048   GFRNONAA 37 (L) 01/20/2024 0355   GFRNONAA >60 01/12/2024 1048   GFRAA >60 06/13/2019 1350   Lipase     Component Value Date/Time   LIPASE 28 09/07/2023 2047       Studies/Results: DG CHEST PORT 1 VIEW Result Date: 01/20/2024 EXAM: 1 VIEW(S) XRAY OF THE CHEST 01/20/2024 08:55:00 AM COMPARISON: 01/18/2024 CLINICAL HISTORY: Respiratory failure (HCC) FINDINGS: LINES, TUBES AND DEVICES: Enteric tube in place with tip off view and side port in expected region of gastric lumen. Endotracheal tube 3 cm above carina. Right internal jugular central venous catheter with tip overlying expected region of superior cavoatrial junction. LUNGS AND  PLEURA: Persistent left apical opacity. Persistent left retrocardiac opacity. Increased right lower lung zone opacity. Unchanged left pleural effusion. No pneumothorax. HEART AND MEDIASTINUM: Atherosclerotic plaque noted. No acute abnormality of the cardiac and mediastinal silhouettes. BONES AND SOFT TISSUES: No acute osseous abnormality. IMPRESSION: 1. Persistent left apical and left retrocardiac opacities. 2. Increased right lower lung zone opacity, which is favored to represent posterior layering pleural effusion. 3. Stable left pleural effusion. 4. Support devices as described. Electronically signed by: Waddell Calk MD 01/20/2024 09:11 AM EST RP Workstation: [id number]   DG OR LOCAL ABDOMEN Result Date: 01/19/2024 EXAM: XR ABDOMEN INTRAOPERATIVE COUNT 1 VIEW(S) XRAY OF THE ABDOMEN 01/19/2024 11:54:26 AM COMPARISON: CT abdomen  01/06/2024. CLINICAL HISTORY: Elective surgery. Clinical history of exploratory laparotomy, intraoperative radiograph. FINDINGS: No unexpected retained surgical items. Enteric tube in stomach with tip and side port projecting over the stomach. Embolization coils in upper abdomen. Drainage catheter in upper abdomen. Atheromatous vascular calcification of the Abdominal Aorta. No serpentine radiodensity characteristic for sponge observed over the imaged portion of the upper abdomen which includes from about the T8 level through the mid Iliac bones. IMPRESSION: 1. No unexpected retained surgical item within the imaged abdomen. 2. Enteric tube, upper abdominal drainage catheter, embolization coils, and abdominal aortic atherosclerotic calcification, as detailed in the report body. Electronically signed by: Ryan Salvage MD 01/19/2024 12:03 PM EST RP Workstation: [id number]    Anti-infectives: Anti-infectives (From admission, onward)    Start     Dose/Rate Route Frequency Ordered Stop   01/17/24 2245  piperacillin -tazobactam (ZOSYN ) IVPB 3.375 g        3.375 g 12.5 mL/hr over 240 Minutes Intravenous Every 8 hours 01/17/24 2149     01/17/24 2200  fluconazole  (DIFLUCAN ) IVPB 400 mg        400 mg 100 mL/hr over 120 Minutes Intravenous Every 24 hours 01/17/24 2059          Assessment/Plan  duodenal ulcer hemorrhage  PMH EGD and IR coil embolization of gastric ulcer in August 2025 Presented to [facility] 12/16 w/ hematemesis, EGD 12/17 showed bleed from duodenal ulcer, clips placed Transferred to [facility] for critical care services and IR consult Arrived in hemorrhagic shock, critical, MTP started, taken emergently to the OR for below:    POD 3 S/P LAPAROTOMY, EXPLORATORY, Duodenotom, Ligation of GDA and branches, ABThera wound VAC placement 12/17 Dr. Rubin  POD 1 S/P ex lap, abdominal wall closure 12/19 Dr. Ebbie; some bleeding from liver capsule controlled with cautery and surgicel snow.  -  hemodynamically stable this AM, no pressors,  - evidence of bleeding via JP drain but hgb stable, plts 50's, we will follow this closely, coagulopathy workup/correction per CCM. - continue NG To LIWS, no meds per tube  - we-to-dry midline - continue RUQ drain - continue IV abx    Per CCM: Hemorrhagic shock - s/p MTP, off of pressors  Metastatic lung CA COPD OSA AKI  PMH CVA PMH afib on eliquis  - DOAC held in the setting of acute bleeding   LOS: 3 days   I reviewed nursing notes, hospitalist notes, last 24 h vitals and pain scores, last 48 h intake and output, last 24 h labs and trends, and last 24 h imaging results.  This care required moderate level of medical decision making.   Almarie Pringle, PA-C [facility] Please see Amion for pager number during day hours 7:00am-4:30pm

## 2024-01-20 NOTE — Progress Notes (Signed)
 "  NAME:  Raymond Hurst, MRN:  [id number], DOB:  1951-01-22, LOS: 3 ADMISSION DATE:  01/16/2024, CONSULTATION DATE:  12/17 REFERRING MD:  Arlon CHIEF COMPLAINT:  GIB   History of Present Illness:  73 year old male with past medical history of atrial fibrillation on Eliquis , stroke, OSA on CPAP, COPD, history of GIB 2/2 gastric ulcer who presented to [facility] ED with hematemesis.   He presented 12/16 with new hematemesis after restarting Eliquis . Had been admitted 01/06/24-01/08/24 with bloody stool found to have gastric ulcer. He was cleared by GI to restart Eliquis . He had episode at home of dizziness, hematemesis. In ED had initial systolics in 80s and given IVF, 1U PRBC, Kcentra , PPI. GI was reconsulted. 12/17 had ongoing blood loss anemia without recurrent hematemesis and was given additional 2U  PRBC. EGD showed duodenal active bleed which was clipped. He was intubated during the procedure and admitted to ICU after for ongoing care. Recommended that he transfer to [facility] for IR consult.   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 Not an IR candidate. Underwent massive transfusion protocol. Taken to OR by gen surg for emergent surgery. S/p ex-lap duodenotomy and ligation of gastroduodenal artery.  12/19: S/p ex lap abdominal wall closure, some bleeding from liver capsule controlled with cautery and Surgicel     Interim History / Subjective:   S/p ex lap abdominal wall closure, some bleeding from liver capsule controlled with cautery and Surgicel on 12/19  This a.m. Bloody JP output, Hb stable 16.1, PLT in the 50s, coag panel  pending Will continue NG to LIS  Otherwise low vent settings, following commands, adequate UOP Will start TPN today    Objective   Blood pressure 104/68, pulse 70, temperature 99.7 F (37.6 C), resp. rate 18, height 6' (1.829 m), weight (!) 141.3 kg, SpO2 96%.    Vent Mode: PRVC FiO2 (%):  [40 %] 40 % Set Rate:  [18 bmp-20 bmp] 18 bmp Vt Set:  [620 mL] 620 mL PEEP:  [5 cmH20-8 cmH20] 8 cmH20 Plateau Pressure:  [17 cmH20-22 cmH20] 22 cmH20   Intake/Output Summary (Last 24 hours) at 01/20/2024 9270 Last data filed at 01/20/2024 0600 Gross per 24 hour  Intake 2664.27 ml  Output 2634 ml  Net 30.27 ml   Filed Weights   01/16/24 1620 01/19/24 0600  Weight: 119.7 kg (!) 141.3 kg    Examination: General: Intubated, sedated HEENT: NCAT, ETT in place Lungs: Symmetrical chest wall movement, clear to auscultation CVS: S1-S2 normal Abdomen: Soft, appropriate tenderness to palpation, midline incision clean and dry, right-JP drain-bloody output Extremities: Chronic lymphedema, 2+ edema Neuro: PERL, follows commands, moves all extremities GU: Foley    Resolved Hospital Problem list    Assessment & Plan:   72 year old male with history of A-fib was on Eliquis , stroke, OSA on CPAP, COPD, presented with hematemesis/hemorrhagic shock due to duodenal ulcer hemorrhage.   Hemorrhagic shock 2/2 acute UGIB - duodenal s/p clip Recent history of gastric ulcer bleed  Thrombocytopenia - s/p massive transfusion protocol 12/17 and Kcentra , TXA and factor 7. - s/p laparotomy for duodenotomy and ligation of GDA, ABThera wound VAC placement 12/17 -  S/p ex lap, abdominal wall closure,-some bleeding from liver capsule controlled with cautery and Surgicel 12/19  -Now with high bloody JP output.  Hb stable, PL in the 50s - Coagulation panel - Serial H&H - Continue NG to LIS - N.p.o. - TPN - Continue empiric Zosyn  and fluconazole    Post-procedural vent management  COPD  OSA on CPAP  -  continue vent support - Maintain plateau pressures below 30cmH2O - yupelri  and brovana  nebs - On low vent settings, with high bloody JP output will closely monitor but can come off the vent if he is ready  AKI Lactic Acidosis - trend serum Cr and UOP   Atrial fibrillation on Eliquis  History of stroke  - hold anticoagulation - currently rate controlled  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  See PMH above for treatment hx.  Nutrition - NPO - TPN    Big things: Closely monitor for high bloody JP output, continue NG to LIS Start TPN Wean off vent   Labs   CBC: Recent Labs  Lab 01/16/24 1623 01/16/24 1708 01/17/24 0441 01/17/24 1407 01/17/24 2347 01/18/24 0230 01/18/24 0425 01/18/24 0439 01/18/24 0701 01/18/24 1244 01/19/24 0324 01/19/24 0910 01/19/24 1113 01/19/24 1629 01/19/24 2026 01/20/24 0011 01/20/24 0355  WBC 6.8  --  4.8  --  10.0  --  11.1*  --   --   --  10.2  --   --   --   --   --  7.3  NEUTROABS 5.4  --   --   --  9.0*  --   --   --   --   --   --   --   --   --   --   --   --   HGB 7.7*   < > 7.0*   < > 17.7*   < > 17.5*   < >  --    < > 17.2*   < > 16.9 16.0 15.7 15.7 15.9  HCT 25.4*   < > 22.1*   < > 50.4   < > 48.2   < >  --    < > 48.7   < > 49.4 48.0 46.3 46.4 47.1  MCV 87.3  --  86.7  --  84.0  --  83.5  --   --   --  84.7  --   --   --   --   --  88.7  PLT 257  --  191  --  67*  --  67*  --  65*  --  64*  --   --   --   --   --  54*   < > = values in this interval not displayed.    Basic Metabolic Panel: Recent Labs  Lab 01/17/24 0441 01/17/24 1755 01/17/24 2347 01/18/24 0230 01/18/24 0425 01/18/24 0439 01/18/24 1446 01/18/24 1451 01/19/24 0324 01/19/24 0910 01/20/24 0355  NA 140   < > 145   < > 145   < > 146* 145 147* 147* 146*  K 4.1   < > 3.7   < > 3.7   < > 3.7 3.7 3.6 3.8 3.8  CL 108  --  110  --  111  --   --  112* 112*  --  112*  CO2 22  --  18*  --  21*  --    --   --  22  --  25  GLUCOSE 87  --  174*  --  156*  --   --  128* 121*  --  100*  BUN 31*  --  32*  --  37*  --   --  46* 61*  --  65*  CREATININE 0.66  --  1.11  --  1.28*  --   --  1.70* 1.80*  --  1.90*  CALCIUM  8.1*  --  10.4*  --  10.1  --   --   --  8.9  --  8.4*  MG  --   --   --   --  1.5*  --   --   --  1.7  --  2.0  PHOS  --   --   --   --   --   --   --   --  5.0*  --  4.4   < > = values in this interval not displayed.   GFR: Estimated Creatinine Clearance: 52 mL/min (A) (by C-G formula based on SCr of 1.9 mg/dL (H)). Recent Labs  Lab 01/17/24 2347 01/18/24 0425 01/19/24 0324 01/20/24 0355  WBC 10.0 11.1* 10.2 7.3  LATICACIDVEN 5.9* 4.1*  --   --     Liver Function Tests: Recent Labs  Lab 01/16/24 1623  AST 13*  ALT 6  ALKPHOS 49  BILITOT 0.6  PROT 5.7*  ALBUMIN 3.5   No results for input(s): LIPASE, AMYLASE in the last 168 hours. No results for input(s): AMMONIA in the last 168 hours.  ABG    Component Value Date/Time   PHART 7.460 (H) 01/19/2024 0910   PCO2ART 34.3 01/19/2024 0910   PO2ART 84 01/19/2024 0910   HCO3 24.5 01/19/2024 0910   TCO2 26 01/19/2024 0910   ACIDBASEDEF 1.0 01/18/2024 0439   O2SAT 97 01/19/2024 0910     Coagulation Profile: Recent Labs  Lab 01/18/24 0701 01/18/24 0702  INR 1.1 1.0    Cardiac Enzymes: No results for input(s): CKTOTAL, CKMB, CKMBINDEX, TROPONINI in the last 168 hours.  HbA1C: Hgb A1c MFr Bld  Date/Time Value Ref Range Status  01/17/2024 11:47 PM 5.5 4.8 - 5.6 % Final    Comment:    (NOTE) Diagnosis of Diabetes The following HbA1c ranges recommended by the American Diabetes Association (ADA) may be used as an aid in the diagnosis of diabetes mellitus.  Hemoglobin             Suggested A1C NGSP%              Diagnosis  <5.7                   Non Diabetic  5.7-6.4                Pre-Diabetic  >6.4                   Diabetic  <7.0                   Glycemic control for                        adults with diabetes.    06/08/2018 12:25 AM 5.2 4.8 - 5.6 % Final    Comment:    (NOTE) Pre diabetes:          5.7%-6.4% Diabetes:              >  6.4% Glycemic control for   <7.0% adults with diabetes     CBG: Recent Labs  Lab 01/19/24 1255 01/19/24 1535 01/19/24 1927 01/19/24 2301 01/20/24 0358  GLUCAP 112* 113* 131* 99 99       Critical care time: 35 mins     CRITICAL CARE  Lenny Drought, MD  [facility] Prefer epic messenger for cross cover needs   Critical care time was exclusive of separately billable procedures and treating other patients.  Critical care was necessary to treat or prevent imminent or life-threatening deterioration.  Critical care was time spent personally by me on the following activities: development of treatment plan with patient and/or surrogate as well as nursing, discussions with consultants, evaluation of patient's response to treatment, examination of patient, obtaining history from patient or surrogate, ordering and performing treatments and interventions, ordering and review of laboratory studies, ordering and review of radiographic studies, pulse oximetry, re-evaluation of patient's condition and participation in multidisciplinary rounds.          "

## 2024-01-21 DIAGNOSIS — G4733 Obstructive sleep apnea (adult) (pediatric): Secondary | ICD-10-CM | POA: Diagnosis not present

## 2024-01-21 DIAGNOSIS — J449 Chronic obstructive pulmonary disease, unspecified: Secondary | ICD-10-CM | POA: Diagnosis not present

## 2024-01-21 DIAGNOSIS — D696 Thrombocytopenia, unspecified: Secondary | ICD-10-CM | POA: Diagnosis not present

## 2024-01-21 DIAGNOSIS — K922 Gastrointestinal hemorrhage, unspecified: Secondary | ICD-10-CM | POA: Diagnosis not present

## 2024-01-21 LAB — HEMOGLOBIN AND HEMATOCRIT, BLOOD
HCT: 49.8 % (ref 39.0–52.0)
HCT: 54.7 % — ABNORMAL HIGH (ref 39.0–52.0)
Hemoglobin: 16.7 g/dL (ref 13.0–17.0)
Hemoglobin: 17.8 g/dL — ABNORMAL HIGH (ref 13.0–17.0)

## 2024-01-21 LAB — CBC
HCT: 49.6 % (ref 39.0–52.0)
Hemoglobin: 16.5 g/dL (ref 13.0–17.0)
MCH: 29.6 pg (ref 26.0–34.0)
MCHC: 33.3 g/dL (ref 30.0–36.0)
MCV: 89 fL (ref 80.0–100.0)
Platelets: 51 K/uL — ABNORMAL LOW (ref 150–400)
RBC: 5.57 MIL/uL (ref 4.22–5.81)
RDW: 17.2 % — ABNORMAL HIGH (ref 11.5–15.5)
WBC: 7.3 K/uL (ref 4.0–10.5)
nRBC: 0 % (ref 0.0–0.2)

## 2024-01-21 LAB — BASIC METABOLIC PANEL WITH GFR
Anion gap: 10 (ref 5–15)
BUN: 64 mg/dL — ABNORMAL HIGH (ref 8–23)
CO2: 23 mmol/L (ref 22–32)
Calcium: 8.4 mg/dL — ABNORMAL LOW (ref 8.9–10.3)
Chloride: 114 mmol/L — ABNORMAL HIGH (ref 98–111)
Creatinine, Ser: 1.82 mg/dL — ABNORMAL HIGH (ref 0.61–1.24)
GFR, Estimated: 39 mL/min — ABNORMAL LOW
Glucose, Bld: 99 mg/dL (ref 70–99)
Potassium: 3.6 mmol/L (ref 3.5–5.1)
Sodium: 148 mmol/L — ABNORMAL HIGH (ref 135–145)

## 2024-01-21 LAB — GLUCOSE, CAPILLARY
Glucose-Capillary: 100 mg/dL — ABNORMAL HIGH (ref 70–99)
Glucose-Capillary: 92 mg/dL (ref 70–99)
Glucose-Capillary: 85 mg/dL (ref 70–99)
Glucose-Capillary: 86 mg/dL (ref 70–99)
Glucose-Capillary: 110 mg/dL — ABNORMAL HIGH (ref 70–99)
Glucose-Capillary: 127 mg/dL — ABNORMAL HIGH (ref 70–99)

## 2024-01-21 LAB — PROCALCITONIN: Procalcitonin: 2.93 ng/mL

## 2024-01-21 MED ORDER — ORAL CARE MOUTH RINSE
15.0000 mL | OROMUCOSAL | Status: DC
  Administered 2024-01-21 – 2024-01-29 (×75): 15 mL via OROMUCOSAL

## 2024-01-21 MED ORDER — POTASSIUM CHLORIDE 20 MEQ PO PACK: 40.0000 meq | PACK | Freq: Once | ORAL | Status: DC

## 2024-01-21 MED ORDER — LABETALOL HCL 5 MG/ML IV SOLN
10.0000 mg | INTRAVENOUS | Status: DC | PRN
  Administered 2024-01-22 (×2): 10 mg via INTRAVENOUS
  Filled 2024-01-21 (×2): qty 4

## 2024-01-21 MED ORDER — DEXMEDETOMIDINE HCL IN NACL 400 MCG/100ML IV SOLN
0.0000 ug/kg/h | INTRAVENOUS | Status: DC
  Administered 2024-01-21: 0.3 ug/kg/h via INTRAVENOUS
  Administered 2024-01-21: 0.4 ug/kg/h via INTRAVENOUS
  Administered 2024-01-21: 0.5 ug/kg/h via INTRAVENOUS
  Administered 2024-01-22: 0.6 ug/kg/h via INTRAVENOUS
  Administered 2024-01-22 (×2): 0.8 ug/kg/h via INTRAVENOUS
  Administered 2024-01-22: 0.7 ug/kg/h via INTRAVENOUS
  Administered 2024-01-22: 0.5 ug/kg/h via INTRAVENOUS
  Administered 2024-01-23: 1.1 ug/kg/h via INTRAVENOUS
  Filled 2024-01-21 (×8): qty 100
  Filled 2024-01-21: qty 200
  Filled 2024-01-21: qty 100

## 2024-01-21 MED ORDER — ORAL CARE MOUTH RINSE: 15.0000 mL | OROMUCOSAL | Status: DC | PRN

## 2024-01-21 MED ORDER — TRAVASOL 10 % IV SOLN
INTRAVENOUS | Status: AC
  Filled 2024-01-21: qty 424.8

## 2024-01-21 MED ORDER — POTASSIUM CHLORIDE 10 MEQ/50ML IV SOLN
10.0000 meq | INTRAVENOUS | Status: AC
  Administered 2024-01-21 (×4): 10 meq via INTRAVENOUS
  Filled 2024-01-21 (×4): qty 50

## 2024-01-21 MED ORDER — ACETAMINOPHEN 10 MG/ML IV SOLN
1000.0000 mg | Freq: Once | INTRAVENOUS | Status: AC
  Administered 2024-01-21: 1000 mg via INTRAVENOUS
  Filled 2024-01-21: qty 100

## 2024-01-21 NOTE — Progress Notes (Cosign Needed Addendum)
 [facility] Progress Note  2 Days Post-Op  Subjective: CC:  Alert, FC on vent, getting tachycardic with SBTs  Otherwise hemodynamically stable Hgb remains stable at 16.5   Objective: Vital signs in last 24 hours: Temp:  [96.8 F (36 C)-100.2 F (37.9 C)] 99.9 F (37.7 C) (12/21 0930) Pulse Rate:  [64-121] 96 (12/21 0930) Resp:  [13-21] 18 (12/21 0930) BP: (111-148)/(66-104) 133/92 (12/21 0900) SpO2:  [92 %-100 %] 99 % (12/21 0930) Arterial Line BP: (125-191)/(63-99) 129/65 (12/21 0930) FiO2 (%):  [40 %] 40 % (12/21 0800) Weight:  [139.4 kg] 139.4 kg (12/21 0324) Last BM Date : 01/19/24  Intake/Output from previous day: 12/20 0701 - 12/21 0700 In: 1207.8 [I.V.:543.3; IV Piggyback:624.5] Out: 5150 [Urine:3930; Emesis/NG output:240; Drains:980] Intake/Output this shift: Total I/O In: 135.9 [I.V.:12.2; IV Piggyback:123.7] Out: 275 [Urine:275]  PE: Gen:  Alert, NAD Card:  Regular rate and rhythm Pulm:  on vent  Abd: Soft, protuberant, midline incision c/d/I  R JP drain (sitting near duodenal repair) - 980 mL/24h, less bloody and more SS today- question bile-tinged fluid, monitor closely.  Skin: warm and dry, edematous, chronic lower extremity skin changes  Lab Results:  Recent Labs    01/20/24 0355 01/20/24 0802 01/20/24 1400 01/20/24 1426 01/20/24 2356 01/21/24 0315  WBC 7.3  --   --   --   --  7.3  HGB 15.9   < > 16.3   < > 16.7 16.5  HCT 47.1   < > 48.8   < > 49.8 49.6  PLT 54*  --  52*  --   --  51*   < > = values in this interval not displayed.   BMET Recent Labs    01/20/24 0355 01/20/24 1426 01/21/24 0315  NA 146* 146* 148*  K 3.8 3.4* 3.6  CL 112*  --  114*  CO2 25  --  23  GLUCOSE 100*  --  99  BUN 65*  --  64*  CREATININE 1.90*  --  1.82*  CALCIUM  8.4*  --  8.4*   PT/INR Recent Labs    01/20/24 1400  LABPROT 16.3*  INR 1.2   CMP     Component Value Date/Time   NA 148 (H) 01/21/2024 0315   NA 137 06/01/2023 1439   K  3.6 01/21/2024 0315   CL 114 (H) 01/21/2024 0315   CO2 23 01/21/2024 0315   GLUCOSE 99 01/21/2024 0315   BUN 64 (H) 01/21/2024 0315   BUN 9 06/01/2023 1439   CREATININE 1.82 (H) 01/21/2024 0315   CREATININE 0.91 01/12/2024 1048   CALCIUM  8.4 (L) 01/21/2024 0315   PROT 5.7 (L) 01/16/2024 1623   PROT 6.2 06/01/2023 1439   ALBUMIN 3.5 01/16/2024 1623   ALBUMIN 3.0 (L) 06/01/2023 1439   AST 13 (L) 01/16/2024 1623   AST 14 (L) 01/12/2024 1048   ALT 6 01/16/2024 1623   ALT 5 01/12/2024 1048   ALKPHOS 49 01/16/2024 1623   BILITOT 0.6 01/16/2024 1623   BILITOT 0.5 01/12/2024 1048   GFRNONAA 39 (L) 01/21/2024 0315   GFRNONAA >60 01/12/2024 1048   GFRAA >60 06/13/2019 1350   Lipase     Component Value Date/Time   LIPASE 28 09/07/2023 2047       Studies/Results: DG CHEST PORT 1 VIEW Result Date: 01/20/2024 EXAM: 1 VIEW(S) XRAY OF THE CHEST 01/20/2024 08:55:00 AM COMPARISON: 01/18/2024 CLINICAL HISTORY: Respiratory failure (HCC) FINDINGS: LINES, TUBES AND DEVICES: Enteric tube in place  with tip off view and side port in expected region of gastric lumen. Endotracheal tube 3 cm above carina. Right internal jugular central venous catheter with tip overlying expected region of superior cavoatrial junction. LUNGS AND PLEURA: Persistent left apical opacity. Persistent left retrocardiac opacity. Increased right lower lung zone opacity. Unchanged left pleural effusion. No pneumothorax. HEART AND MEDIASTINUM: Atherosclerotic plaque noted. No acute abnormality of the cardiac and mediastinal silhouettes. BONES AND SOFT TISSUES: No acute osseous abnormality. IMPRESSION: 1. Persistent left apical and left retrocardiac opacities. 2. Increased right lower lung zone opacity, which is favored to represent posterior layering pleural effusion. 3. Stable left pleural effusion. 4. Support devices as described. Electronically signed by: Waddell Calk MD 01/20/2024 09:11 AM EST RP Workstation: [id number]   DG OR  LOCAL ABDOMEN Result Date: 01/19/2024 EXAM: XR ABDOMEN INTRAOPERATIVE COUNT 1 VIEW(S) XRAY OF THE ABDOMEN 01/19/2024 11:54:26 AM COMPARISON: CT abdomen 01/06/2024. CLINICAL HISTORY: Elective surgery. Clinical history of exploratory laparotomy, intraoperative radiograph. FINDINGS: No unexpected retained surgical items. Enteric tube in stomach with tip and side port projecting over the stomach. Embolization coils in upper abdomen. Drainage catheter in upper abdomen. Atheromatous vascular calcification of the Abdominal Aorta. No serpentine radiodensity characteristic for sponge observed over the imaged portion of the upper abdomen which includes from about the T8 level through the mid Iliac bones. IMPRESSION: 1. No unexpected retained surgical item within the imaged abdomen. 2. Enteric tube, upper abdominal drainage catheter, embolization coils, and abdominal aortic atherosclerotic calcification, as detailed in the report body. Electronically signed by: Ryan Salvage MD 01/19/2024 12:03 PM EST RP Workstation: [id number]    Anti-infectives: Anti-infectives (From admission, onward)    Start     Dose/Rate Route Frequency Ordered Stop   01/17/24 2245  piperacillin -tazobactam (ZOSYN ) IVPB 3.375 g        3.375 g 12.5 mL/hr over 240 Minutes Intravenous Every 8 hours 01/17/24 2149     01/17/24 2200  fluconazole  (DIFLUCAN ) IVPB 400 mg        400 mg 100 mL/hr over 120 Minutes Intravenous Every 24 hours 01/17/24 2059          Assessment/Plan  duodenal ulcer hemorrhage  PMH EGD and IR coil embolization of gastric ulcer in August 2025 Presented to  12/16 w/ hematemesis, EGD 12/17 showed bleed from duodenal ulcer, clips placed Transferred to [facility] for critical care services and IR consult Arrived in hemorrhagic shock, critical, MTP started, taken emergently to the OR for below:    POD 4 S/P LAPAROTOMY, EXPLORATORY, Duodenotom, Ligation of GDA and branches, ABThera wound VAC placement 12/17 Dr.  Rubin  POD 2 S/P ex lap, abdominal wall closure 12/19 Dr. Ebbie; some bleeding from liver capsule controlled with cautery and surgicel snow.  - hemodynamically stable this AM, no pressors - hgb 16, plts 51 - JP drain less bloody, monitor quality of fluid  - continue NG To LIWS, no meds per tube  - we-to-dry midline - continue IV abx  - vent weaning trials ongoing per CCM  - agree with TPN until able to tolerate PO; will discuss with MD if he needs an UGI prior to enteral nutrition   Per CCM: Hemorrhagic shock - s/p MTP, off of pressors  Metastatic lung CA COPD OSA AKI  PMH CVA PMH afib on eliquis  - DOAC held in the setting of acute bleeding   LOS: 4 days   I reviewed nursing notes, hospitalist notes, last 24 h vitals and pain scores, last 48 h intake and  output, last 24 h labs and trends, and last 24 h imaging results.  This care required moderate level of medical decision making.   Almarie Pringle, PA-C [facility] Please see Amion for pager number during day hours 7:00am-4:30pm

## 2024-01-21 NOTE — Progress Notes (Signed)
 PHARMACY - TOTAL PARENTERAL NUTRITION CONSULT NOTE   Indication: prolonged NPO  Patient Measurements: Height: 6' (182.9 cm) Weight: (!) 139.4 kg (307 lb 5.1 oz) IBW/kg (Calculated) : 77.6 TPN AdjBW (KG): 88.1 Body mass index is 41.68 kg/m. Usual Weight: ~117-119 kg  Assessment: 73 yom admitted to [facility] with hematemesis after resuming apixaban  after recent admit 12/6-12/8 for melena. EGD on 12/8 showed non-bleeding duodenal ulcer with a clean ulcer base. He was transferred to [facility] for IR eval but not a candidate. He was taken to OR emergently for ex-lap duodenotomy and ligation of gastroduodenal artery. He is s/p MTP for duodenal hemorrhage and has been NPO since surgery. PMH includes Afib (on apixaban ), stroke, COPD, lung cancer with mets, and multiple GIBs. Pharmacy consulted to manage TPN.  Glucose / Insulin : A1c 5.5, no hx DM, no SSI used Electrolytes: Na 148, K 3.6 (4 runs ordered), Cl 114, CO2 23, CoCa 8.8, Phos 4.4, Mg 2 Renal: AKI, SCr 1.82 (baseline 0.9), BUN 64 Hepatic: LFTs / Tbili / TG WNL, albumin 3.5 Intake / Output; MIVF: UOP 1.2 mL/kg/hr, NGT 240 mL, JP drain 980 mL, net -3.4L GI Imaging: GI Surgeries / Procedures:  12/17 EGD: red blood entire stomach, duodenal bulb, duodenum s/p hemostatic clip, hemospray  12/17 ex-lap with ligation of GDA and branches, VAC placement 12/19 abd wall closure  Central access: CVC 01/17/24 TPN start date: 01/21/24  Nutritional Goals: Goal TPN rate is 83 mL/hr (provides 120 g of protein and 2134 kcals per day)  RD Assessment: Estimated Needs Total Energy Estimated Needs: 2100-2300 Total Protein Estimated Needs: 115-130g Total Fluid Estimated Needs: >/=2L  Current Nutrition:  NPO and TPN  Plan:  Start TPN at 66mL/hr at 1800 (provides 42 g of protein and 753 kcals per day) and ~35% needs Electrolytes in TPN: Na 60mEq/L, K 1mEq/L, Ca 33mEq/L, Mg 26mEq/L, and Phos 15mmol/L. Max acetate Add standard MVI and trace elements to TPN Initiate  Sensitive q4h SSI and adjust as needed  Monitor TPN labs on Mon/Thurs, daily until stable  Thank you for involving pharmacy in this patient's care.  Delon Sax, PharmD, BCPS Clinical Pharmacist Clinical phone for 01/21/2024 is [phone] 01/21/2024 7:03 AM

## 2024-01-21 NOTE — Progress Notes (Signed)
 Dr Sharie on floor and made aware that pt has spiked a fever of 102. New order received for one time dose of Tylenol .

## 2024-01-21 NOTE — Progress Notes (Signed)
 eLink Physician-Brief Progress Note Patient Name: Raymond Hurst DOB: 07-17-1950 MRN: [id number]   Date of Service  01/21/2024  HPI/Events of Note  Hypertensive with systolics 140s-160s with a history of hypertension on metoprolol  at home.  Minimal response to fentanyl   eICU Interventions  Hold home metoprolol , add as needed labetalol  for the time being     Intervention Category Intermediate Interventions: Hypertension - evaluation and management  Tanajah Boulter 01/21/2024, 1:46 AM

## 2024-01-21 NOTE — Progress Notes (Signed)
 [facility] ADULT ICU REPLACEMENT PROTOCOL   The patient does apply for the [facility] Adult ICU Electrolyte Replacment Protocol based on the criteria listed below:   1.Exclusion criteria: TCTS, ECMO, Dialysis, and Myasthenia Gravis patients 2. Is GFR >/= 30 ml/min? Yes.    Patient's GFR today is 39 3. Is SCr </= 2? Yes.   Patient's SCr is 1.82 mg/dL 4. Did SCr increase >/= 0.5 in 24 hours? No. 5.Pt's weight >40kg  Yes.   6. Abnormal electrolyte(s): K+ 3.6  7. Electrolytes replaced per protocol 8.  Call MD STAT for K+ </= 2.5, Phos </= 1, or Mag </= 1 Physician:  Dr. Haze Rummer, Recardo ORN 01/21/2024 5:17 AM

## 2024-01-21 NOTE — Progress Notes (Signed)
 Pt has been on SBT for about 30 minutes. At that time it was noted that pt was having increased HR to 140's and hypertensive. Pt reassured and support given. Pt nodding yes to feeling short of breath. MD on unit and made aware. New order received to start Precedex .

## 2024-01-21 NOTE — Progress Notes (Addendum)
 eLink Physician-Brief Progress Note Patient Name: Raymond Hurst DOB: 16-Nov-1950 MRN: [id number]   Date of Service  01/21/2024  HPI/Events of Note  Temp of 102, no other hemodynamic distress, mechanically ventilated on a continuous Precedex  infusion, fentanyl  infusion.  Acetaminophen  IV around 1400 with no significant improvement.  eICU Interventions  No clear indication for antipyresis at this time.  Favor nonpharmacological management with external cooling.  Question drug reaction with Precedex .  Obtain blood cultures.  Procalcitonin.   2023 SCCM/IDSA guidelines: For critically ill patients with fever, we suggest avoiding the routine use of antipyretic medications for the specific purpose of reducing the temperature.  2301 -elevated procalcitonin reviewed, continue to trend.  Maintain Zosyn .  Intervention Category Minor Interventions: Routine modifications to care plan (e.g. PRN medications for pain, fever)  Avigdor Dollar 01/21/2024, 9:24 PM

## 2024-01-21 NOTE — Anesthesia Postprocedure Evaluation (Signed)
"   Anesthesia Post Note  Patient: DAX MURGUIA  Procedure(s) Performed: EGD (ESOPHAGOGASTRODUODENOSCOPY) CONTROL OF HEMORRHAGE, GI TRACT, ENDOSCOPIC, BY CLIPPING OR OVERSEWING  Patient location during evaluation: Phase II Anesthesia Type: MAC Level of consciousness: awake Pain management: pain level controlled Vital Signs Assessment: post-procedure vital signs reviewed and stable Respiratory status: spontaneous breathing and respiratory function stable Cardiovascular status: blood pressure returned to baseline and stable Postop Assessment: no headache and no apparent nausea or vomiting Anesthetic complications: no Comments: Late entry   No notable events documented.   Last Vitals:  Vitals:   01/21/24 1415 01/21/24 1430  BP:    Pulse: 75 77  Resp: 18 18  Temp: (!) 38.9 C (!) 38.9 C  SpO2: 98% 97%    Last Pain:  Vitals:   01/21/24 1400  TempSrc: Bladder  PainSc:                  Yvonna PARAS Shakiah Wester      "

## 2024-01-21 NOTE — Progress Notes (Signed)
 "  NAME:  Raymond Hurst, MRN:  [id number], DOB:  09/05/1950, LOS: 4 ADMISSION DATE:  01/16/2024, CONSULTATION DATE:  12/17 REFERRING MD:  Arlon CHIEF COMPLAINT:  GIB   History of Present Illness:  73 year old male with past medical history of atrial fibrillation on Eliquis , stroke, OSA on CPAP, COPD, history of GIB 2/2 gastric ulcer who presented to [facility] ED with hematemesis.   He presented 12/16 with new hematemesis after restarting Eliquis . Had been admitted 01/06/24-01/08/24 with bloody stool found to have gastric ulcer. He was cleared by GI to restart Eliquis . He had episode at home of dizziness, hematemesis. In ED had initial systolics in 80s and given IVF, 1U PRBC, Kcentra , PPI. GI was reconsulted. 12/17 had ongoing blood loss anemia without recurrent hematemesis and was given additional 2U  PRBC. EGD showed duodenal active bleed which was clipped. He was intubated during the procedure and admitted to ICU after for ongoing care. Recommended that he transfer to [facility] for IR consult.   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 Not an IR candidate. Underwent massive transfusion protocol. Taken to OR by gen surg for emergent surgery. S/p ex-lap duodenotomy and ligation of gastroduodenal artery.  12/19: S/p ex lap abdominal wall closure, some bleeding from liver capsule controlled with cautery and Surgicel     Interim History / Subjective:   S/p ex lap abdominal wall closure, some bleeding from liver capsule controlled with cautery and Surgicel on 12/19  This a.m. Decreased JP output 170 mL overnight, improved from 810 mL yesterday  daytime.  NG to LIS Starting TPN today Failed pressure support this a.m., will try again in the afternoon Otherwise following commands, adequate UOP   Objective   Blood pressure (!) 132/100, pulse 78, temperature 98.2 F (36.8 C), resp. rate (!) 21, height 6' (1.829 m), weight (!) 139.4 kg, SpO2 100%.    Vent Mode: PRVC FiO2 (%):  [40 %] 40 % Set Rate:  [18 bmp] 18 bmp Vt Set:  [620 mL] 620 mL PEEP:  [5 cmH20-8 cmH20] 5 cmH20 Plateau Pressure:  [16 cmH20-20 cmH20] 20 cmH20   Intake/Output Summary (Last 24 hours) at 01/21/2024 0720 Last data filed at 01/21/2024 9362 Gross per 24 hour  Intake 1125.82 ml  Output 5150 ml  Net -4024.18 ml   Filed Weights   01/16/24 1620 01/19/24 0600 01/21/24 0324  Weight: 119.7 kg (!) 141.3 kg (!) 139.4 kg    Examination: General: Intubated, lying in bed HEENT: NCAT, ETT in place Lungs: Symmetrical chest wall movement, clear to auscultation CVS: S1-S2 normal Abdomen: Soft appropriate tenderness to palpation, midline incision clean and dry, right JP drain-minimal output Extremities: Chronic lymphedema, 2+ edema Neuro: PERRL, follows commands, move all extremities GU: Deferred   Resolved Hospital Problem list    Assessment & Plan:   73 year old male with history of A-fib was on Eliquis , stroke, OSA on CPAP, COPD, presented with hematemesis/hemorrhagic shock due to duodenal ulcer hemorrhage.   Hemorrhagic shock 2/2 acute UGIB - duodenal s/p clip Recent history of gastric ulcer bleed  Thrombocytopenia - s/p massive transfusion protocol 12/17 and Kcentra , TXA and factor 7. - s/p laparotomy for  duodenotomy and ligation of GDA, ABThera wound VAC placement 12/17 - S/p ex lap, abdominal wall closure,-some bleeding from liver capsule controlled with cautery and Surgicel 12/19  -high bloody JP output.  Hb stable, PL in the 50s-improving.  Minimal JP output -Minimal JP output 70 mL overnight (more bile tinged), improved from bloody 810 mL  12/20 - Continue NG to LIS -upper GI prior to enteral nutrition - Serial H&H - N.p.o. 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  See PMH above for treatment hx.  Nutrition - NPO - TPN    Big things: Closely monitor JP output, continue NG to LIS Upper GI prior to enteral nutrition Continue TPN Attempt to extubate today   Labs   CBC: Recent Labs  Lab 01/16/24 1623 01/16/24 1708 01/17/24 2347 01/18/24 0230 01/18/24 0425 01/18/24 0439 01/18/24 0701 01/18/24 1244 01/19/24 0324 01/19/24 0910 01/20/24 0355 01/20/24 0802 01/20/24 1400 01/20/24 1426 01/20/24 1605 01/20/24 2356 01/21/24 0315  WBC 6.8   < > 10.0  --  11.1*  --   --   --  10.2  --  7.3  --   --   --   --   --  7.3  NEUTROABS 5.4  --  9.0*  --   --   --   --   --   --   --   --   --   --   --   --   --   --   HGB 7.7*   < > 17.7*   < > 17.5*   < >  --    < > 17.2*   < > 15.9   < > 16.3 15.3 16.1 16.7 16.5  HCT 25.4*   < > 50.4   < > 48.2   < >  --    < > 48.7   < > 47.1   < > 48.8 45.0 49.5 49.8 49.6  MCV 87.3   < > 84.0  --  83.5  --   --   --  84.7  --  88.7  --   --   --   --   --  89.0  PLT 257   < > 67*  --  67*  --  65*  --  64*  --  54*  --  52*  --   --   --  51*   < > = values in this interval not displayed.    Basic Metabolic Panel: Recent Labs  Lab 01/17/24 2347 01/18/24 0230 01/18/24 0425 01/18/24 0439 01/18/24 1451 01/19/24 0324 01/19/24 0910 01/20/24 0355 01/20/24 1426 01/21/24 0315  NA 145   < > 145   < > 145 147* 147* 146* 146* 148*  K 3.7   < > 3.7   < > 3.7 3.6 3.8 3.8 3.4* 3.6  CL 110   --  111  --  112* 112*  --  112*  --  114*  CO2 18*  --  21*  --   --  22  --  25  --  23  GLUCOSE 174*  --  156*  --  128* 121*  --  100*  --  99  BUN 32*  --  37*  --  46* 61*  --  65*  --  64*  CREATININE 1.11  --  1.28*  --  1.70* 1.80*  --  1.90*  --  1.82*  CALCIUM  10.4*  --  10.1  --   --  8.9  --  8.4*  --  8.4*  MG  --   --  1.5*  --   --  1.7  --  2.0  --   --   PHOS  --   --   --   --   --  5.0*  --  4.4  --   --    < > = values in this interval not displayed.   GFR: Estimated Creatinine Clearance: 53.9 mL/min (A) (by C-G formula based on SCr of 1.82 mg/dL (H)). Recent Labs  Lab 01/17/24 2347 01/18/24 0425 01/19/24 0324 01/20/24 0355 01/21/24 0315  WBC 10.0 11.1* 10.2 7.3 7.3  LATICACIDVEN 5.9* 4.1*  --   --   --     Liver Function Tests: Recent Labs  Lab 01/16/24 1623  AST 13*  ALT 6  ALKPHOS 49  BILITOT 0.6  PROT 5.7*  ALBUMIN 3.5   No results for input(s): LIPASE, AMYLASE in the last 168 hours. No results for input(s): AMMONIA in the last 168 hours.  ABG    Component Value Date/Time   PHART 7.370 01/20/2024 1426   PCO2ART 43.3 01/20/2024 1426   PO2ART 139 (H) 01/20/2024 1426   HCO3 24.9 01/20/2024 1426   TCO2 26 01/20/2024 1426   ACIDBASEDEF 1.0 01/18/2024 0439   O2SAT 99 01/20/2024 1426     Coagulation Profile: Recent Labs  Lab 01/18/24 0701 01/18/24 0702 01/20/24 1400  INR 1.1 1.0 1.2    Cardiac Enzymes: No results for input(s): CKTOTAL, CKMB, CKMBINDEX, TROPONINI in the last 168 hours.  HbA1C: Hgb A1c MFr Bld  Date/Time Value Ref Range Status  01/17/2024 11:47 PM 5.5 4.8 - 5.6 % Final    Comment:    (NOTE) Diagnosis of Diabetes The following HbA1c ranges recommended by the American Diabetes Association (ADA) may be used as an aid in the diagnosis of diabetes mellitus.  Hemoglobin             Suggested A1C NGSP%              Diagnosis  <5.7                   Non Diabetic  5.7-6.4                 Pre-Diabetic  >6.4                   Diabetic  <7.0                   Glycemic control for                       adults with diabetes.    06/08/2018 12:25 AM 5.2 4.8 - 5.6 % Final    Comment:    (NOTE) Pre diabetes:          5.7%-6.4% Diabetes:              >6.4% Glycemic control for   <7.0% adults with diabetes  CBG: Recent Labs  Lab 01/20/24 1149 01/20/24 1602 01/20/24 1909 01/20/24 2332 01/21/24 0317  GLUCAP 88 90 85 92 100*       Critical care time: 40 mins     CRITICAL CARE  Lenny Drought, MD  [facility] Prefer epic messenger for cross cover needs   Critical care time was exclusive of separately billable procedures and treating other patients.  Critical care was necessary to treat or prevent imminent or life-threatening deterioration.  Critical care was time spent personally by me on the following activities: development of treatment plan with patient and/or surrogate as well as nursing, discussions with consultants, evaluation of patient's response to treatment, examination of patient, obtaining history from patient or surrogate, ordering and performing treatments and interventions, ordering and review of laboratory studies, ordering and review of radiographic studies, pulse oximetry, re-evaluation of patient's condition and participation in multidisciplinary rounds.          "

## 2024-01-22 ENCOUNTER — Encounter (HOSPITAL_COMMUNITY): Payer: Self-pay | Admitting: General Surgery

## 2024-01-22 DIAGNOSIS — G4733 Obstructive sleep apnea (adult) (pediatric): Secondary | ICD-10-CM | POA: Diagnosis not present

## 2024-01-22 DIAGNOSIS — K922 Gastrointestinal hemorrhage, unspecified: Secondary | ICD-10-CM | POA: Diagnosis not present

## 2024-01-22 DIAGNOSIS — J449 Chronic obstructive pulmonary disease, unspecified: Secondary | ICD-10-CM | POA: Diagnosis not present

## 2024-01-22 DIAGNOSIS — D696 Thrombocytopenia, unspecified: Secondary | ICD-10-CM | POA: Diagnosis not present

## 2024-01-22 LAB — GLUCOSE, CAPILLARY
Glucose-Capillary: 113 mg/dL — ABNORMAL HIGH (ref 70–99)
Glucose-Capillary: 124 mg/dL — ABNORMAL HIGH (ref 70–99)
Glucose-Capillary: 141 mg/dL — ABNORMAL HIGH (ref 70–99)
Glucose-Capillary: 157 mg/dL — ABNORMAL HIGH (ref 70–99)
Glucose-Capillary: 159 mg/dL — ABNORMAL HIGH (ref 70–99)
Glucose-Capillary: 168 mg/dL — ABNORMAL HIGH (ref 70–99)

## 2024-01-22 LAB — PROCALCITONIN: Procalcitonin: 3.32 ng/mL

## 2024-01-22 LAB — COMPREHENSIVE METABOLIC PANEL WITH GFR
ALT: 8 U/L (ref 0–44)
AST: 14 U/L — ABNORMAL LOW (ref 15–41)
Albumin: 2.7 g/dL — ABNORMAL LOW (ref 3.5–5.0)
Alkaline Phosphatase: 44 U/L (ref 38–126)
Anion gap: 10 (ref 5–15)
BUN: 62 mg/dL — ABNORMAL HIGH (ref 8–23)
CO2: 24 mmol/L (ref 22–32)
Calcium: 8.7 mg/dL — ABNORMAL LOW (ref 8.9–10.3)
Chloride: 118 mmol/L — ABNORMAL HIGH (ref 98–111)
Creatinine, Ser: 1.75 mg/dL — ABNORMAL HIGH (ref 0.61–1.24)
GFR, Estimated: 41 mL/min — ABNORMAL LOW
Glucose, Bld: 142 mg/dL — ABNORMAL HIGH (ref 70–99)
Potassium: 3.6 mmol/L (ref 3.5–5.1)
Sodium: 152 mmol/L — ABNORMAL HIGH (ref 135–145)
Total Bilirubin: 1.2 mg/dL (ref 0.0–1.2)
Total Protein: 5.2 g/dL — ABNORMAL LOW (ref 6.5–8.1)

## 2024-01-22 LAB — HEMOGLOBIN AND HEMATOCRIT, BLOOD
HCT: 53.6 % — ABNORMAL HIGH (ref 39.0–52.0)
HCT: 52.6 % — ABNORMAL HIGH (ref 39.0–52.0)
Hemoglobin: 17.6 g/dL — ABNORMAL HIGH (ref 13.0–17.0)
Hemoglobin: 17.4 g/dL — ABNORMAL HIGH (ref 13.0–17.0)

## 2024-01-22 LAB — CBC
HCT: 53.4 % — ABNORMAL HIGH (ref 39.0–52.0)
Hemoglobin: 17.3 g/dL — ABNORMAL HIGH (ref 13.0–17.0)
MCH: 29.4 pg (ref 26.0–34.0)
MCHC: 32.4 g/dL (ref 30.0–36.0)
MCV: 90.8 fL (ref 80.0–100.0)
Platelets: 59 K/uL — ABNORMAL LOW (ref 150–400)
RBC: 5.88 MIL/uL — ABNORMAL HIGH (ref 4.22–5.81)
RDW: 17.5 % — ABNORMAL HIGH (ref 11.5–15.5)
WBC: 8.1 K/uL (ref 4.0–10.5)
nRBC: 0 % (ref 0.0–0.2)

## 2024-01-22 LAB — MAGNESIUM: Magnesium: 2.3 mg/dL (ref 1.7–2.4)

## 2024-01-22 LAB — TRIGLYCERIDES: Triglycerides: 93 mg/dL

## 2024-01-22 LAB — PHOSPHORUS: Phosphorus: 3.1 mg/dL (ref 2.5–4.6)

## 2024-01-22 MED ORDER — LABETALOL HCL 5 MG/ML IV SOLN: 10.0000 mg | INTRAVENOUS | Status: DC | PRN

## 2024-01-22 MED ORDER — ACETAMINOPHEN 650 MG RE SUPP: 650.0000 mg | Freq: Four times a day (QID) | RECTAL | Status: DC | PRN

## 2024-01-22 MED ORDER — ACETAMINOPHEN 10 MG/ML IV SOLN
1000.0000 mg | Freq: Once | INTRAVENOUS | Status: AC
  Administered 2024-01-22: 1000 mg via INTRAVENOUS
  Filled 2024-01-22: qty 100

## 2024-01-22 MED ORDER — TRACE MINERALS CU-MN-SE-ZN 300-55-60-3000 MCG/ML IV SOLN
INTRAVENOUS | Status: AC
  Filled 2024-01-22: qty 566.4

## 2024-01-22 MED ORDER — BISACODYL 10 MG RE SUPP
10.0000 mg | Freq: Once | RECTAL | Status: AC
  Administered 2024-01-22: 10 mg via RECTAL
  Filled 2024-01-22: qty 1

## 2024-01-22 MED ORDER — LABETALOL HCL 5 MG/ML IV SOLN
10.0000 mg | INTRAVENOUS | Status: DC | PRN
  Administered 2024-01-23: 10 mg via INTRAVENOUS
  Filled 2024-01-22: qty 4

## 2024-01-22 MED ORDER — POTASSIUM CHLORIDE 10 MEQ/100ML IV SOLN
10.0000 meq | INTRAVENOUS | Status: AC
  Administered 2024-01-22 (×4): 10 meq via INTRAVENOUS
  Filled 2024-01-22 (×4): qty 100

## 2024-01-22 MED ORDER — HYDRALAZINE HCL 20 MG/ML IJ SOLN: 10.0000 mg | INTRAMUSCULAR | Status: DC | PRN

## 2024-01-22 MED ORDER — DEXTROSE 5 % IV SOLN: INTRAVENOUS | Status: AC

## 2024-01-22 MED ORDER — HYDRALAZINE HCL 20 MG/ML IJ SOLN
10.0000 mg | INTRAMUSCULAR | Status: DC | PRN
  Administered 2024-01-22 – 2024-01-23 (×3): 10 mg via INTRAVENOUS
  Filled 2024-01-22 (×3): qty 1

## 2024-01-22 NOTE — Progress Notes (Addendum)
 "  NAME:  EPIMENIO SCHETTER, MRN:  [id number], DOB:  03-27-1950, LOS: 5 ADMISSION DATE:  01/16/2024, CONSULTATION DATE:  12/17 REFERRING MD:  Arlon CHIEF COMPLAINT:  GIB   History of Present Illness:  73 year old male with past medical history of atrial fibrillation on Eliquis , stroke, OSA on CPAP, COPD, history of GIB 2/2 gastric ulcer who presented to [facility] ED with hematemesis.   He presented 12/16 with new hematemesis after restarting Eliquis . Had been admitted 01/06/24-01/08/24 with bloody stool found to have gastric ulcer. He was cleared by GI to restart Eliquis . He had episode at home of dizziness, hematemesis. In ED had initial systolics in 80s and given IVF, 1U PRBC, Kcentra , PPI. GI was reconsulted. 12/17 had ongoing blood loss anemia without recurrent hematemesis and was given additional 2U  PRBC. EGD showed duodenal active bleed which was clipped. He was intubated during the procedure and admitted to ICU after for ongoing care. Recommended that he transfer to [facility] for IR consult.   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 Not an IR candidate. Underwent massive transfusion protocol. Taken to OR by gen surg for emergent surgery. S/p ex-lap duodenotomy and ligation of gastroduodenal artery.  12/19: S/p ex lap abdominal wall closure, some bleeding from liver capsule controlled with cautery and Surgicel     Interim History / Subjective:   S/p ex lap abdominal wall closure, some bleeding from liver capsule controlled with cautery and Surgicel on 12/19  This a.m. Decreased JP output 75 mL, bloody, nonbilious N.p.o., NG to LIS, on  TPN Sodium 152-D5 1 L Failed pressure support again due to tachypnea and tachycardia.  Will keep on trying Otherwise follows commands, adequate UOP  Objective   Blood pressure 128/70, pulse 70, temperature (!) 101.7 F (38.7 C), resp. rate 18, height 6' 0.01 (1.829 m), weight 135.7 kg, SpO2 100%.    Vent Mode: PRVC FiO2 (%):  [40 %] 40 % Set Rate:  [18 bmp] 18 bmp Vt Set:  [620 mL] 620 mL PEEP:  [5 cmH20-8 cmH20] 5 cmH20 Pressure Support:  [8 cmH20] 8 cmH20 Plateau Pressure:  [18 cmH20-20 cmH20] 18 cmH20   Intake/Output Summary (Last 24 hours) at 01/22/2024 9257 Last data filed at 01/22/2024 0700 Gross per 24 hour  Intake 1421.56 ml  Output 4025 ml  Net -2603.44 ml   Filed Weights   01/19/24 0600 01/21/24 0324 01/22/24 0232  Weight: (!) 141.3 kg (!) 139.4 kg 135.7 kg    Examination: General: Intubated, lying in bed HEENT: NCAT Lungs: Symmetrical chest movement, clear to auscultation CVS: S1-S2 normal, abdomen: Soft appropriate tenderness to palpation, midline incision clean and dry, right JP drain-minimal output Extremity: Chronic lymphedema, 2+ edema Neuro: PERL, follows commands, moves all extremities GU: Deferred   Resolved Hospital Problem list    Assessment & Plan:   73 year old male with history of A-fib was on Eliquis , stroke, OSA on CPAP, COPD, presented with hematemesis/hemorrhagic shock due to duodenal ulcer hemorrhage.   Hemorrhagic shock 2/2 acute UGIB - duodenal s/p clip Recent history of gastric ulcer bleed  Thrombocytopenia - s/p massive transfusion protocol 12/17 and Kcentra , TXA and factor 7. - s/p  laparotomy for duodenotomy and ligation of GDA, ABThera wound VAC placement 12/17 - S/p ex lap, abdominal wall closure,-some bleeding from liver capsule controlled with cautery and Surgicel 12/19  -high bloody JP output.  Hb stable, PL in the 50s-improving.  Minimal JP output -Minimal JP output 70 mL overnight (more bile tinged), improved from bloody  810 mL 12/20 - Continue NG to LIS -upper GI prior to enteral nutrition - Serial H&H - N.p.o. - TPN - Continue empiric Zosyn  and fluconazole    Post-procedural vent management  COPD  OSA on CPAP  -Wean off vent as tolerated, will try to extubate - yupelri  and brovana  nebs - Failed pressure support, will try again today - Continue SAT and SBT's  Fever No leukocytosis, no worsening of oxygenation Could be atelectasis, Precedex , or old blood. 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  See PMH above for treatment hx.  Nutrition - TPN    Big things: Continue closely monitoring JP output, NG to LIS Upper GI prior to enteral nutrition Continue TPN Will attempt extubation today  Labs   CBC: Recent Labs  Lab 01/16/24 1623 01/16/24 1708 01/17/24 2347 01/18/24 0230 01/18/24 0425 01/18/24 0439 01/19/24 0324 01/19/24 0910 01/20/24 0355 01/20/24 0802 01/20/24 1400 01/20/24 1426 01/20/24 1605 01/20/24 2356 01/21/24 0315 01/21/24 1715 01/22/24 0320  WBC 6.8   < > 10.0  --  11.1*  --  10.2  --  7.3  --   --   --   --   --  7.3  --  8.1  NEUTROABS 5.4  --  9.0*  --   --   --   --   --   --   --   --   --   --   --   --   --   --   HGB 7.7*   < > 17.7*   < > 17.5*   < > 17.2*   < > 15.9   < > 16.3   < > 16.1 16.7 16.5 17.8* 17.3*  17.6*  HCT 25.4*   < > 50.4   < > 48.2   < > 48.7   < > 47.1   < > 48.8   < > 49.5 49.8 49.6 54.7* 53.4*  53.6*  MCV 87.3   < > 84.0  --  83.5  --  84.7  --  88.7  --   --   --   --   --  89.0  --  90.8  PLT 257   < > 67*  --  67*   < > 64*  --  54*  --  52*  --   --   --  51*  --  59*   < > = values in this interval not displayed.    Basic Metabolic Panel: Recent Labs   Lab 01/18/24 0425 01/18/24 0439 01/18/24 1451 01/19/24 0324 01/19/24 0910 01/20/24 0355 01/20/24 1426 01/21/24 0315 01/22/24 0320  NA 145   < > 145 147* 147* 146* 146* 148* 152*  K 3.7   < > 3.7 3.6 3.8 3.8 3.4* 3.6 3.6  CL 111  --  112* 112*  --  112*  --  114* 118*  CO2 21*  --   --  22  --  25  --  23 24  GLUCOSE 156*  --  128* 121*  --  100*  --  99 142*  BUN 37*  --  46* 61*  --  65*  --  64* 62*  CREATININE 1.28*  --  1.70* 1.80*  --  1.90*  --  1.82* 1.75*  CALCIUM  10.1  --   --  8.9  --  8.4*  --  8.4* 8.7*  MG 1.5*  --   --  1.7  --  2.0  --   --  2.3  PHOS  --   --   --  5.0*  --  4.4  --   --  3.1   < > = values in this interval not displayed.   GFR: Estimated Creatinine Clearance: 55.2 mL/min (A) (by C-G formula based on SCr of 1.75 mg/dL (H)). Recent Labs  Lab 01/17/24 2347 01/18/24 0425 01/19/24 0324 01/20/24 0355 01/21/24 0315 01/21/24 2141 01/22/24 0320  PROCALCITON  --   --   --   --   --  2.93 3.32  WBC 10.0 11.1* 10.2 7.3 7.3  --  8.1  LATICACIDVEN 5.9* 4.1*  --   --   --   --   --     Liver Function Tests: Recent Labs  Lab 01/16/24 1623 01/22/24 0320  AST 13* 14*  ALT 6 8  ALKPHOS 49 44  BILITOT 0.6 1.2  PROT 5.7* 5.2*  ALBUMIN 3.5 2.7*   No results for input(s): LIPASE, AMYLASE in the last 168 hours. No results for input(s): AMMONIA in the last 168 hours.  ABG    Component Value Date/Time   PHART 7.370 01/20/2024 1426   PCO2ART 43.3 01/20/2024 1426   PO2ART 139 (H) 01/20/2024 1426   HCO3 24.9 01/20/2024 1426   TCO2 26 01/20/2024 1426   ACIDBASEDEF 1.0 01/18/2024 0439   O2SAT 99 01/20/2024 1426     Coagulation Profile: Recent Labs  Lab 01/18/24 0701 01/18/24 0702 01/20/24 1400  INR 1.1 1.0 1.2    Cardiac Enzymes: No results for input(s): CKTOTAL, CKMB, CKMBINDEX, TROPONINI in the last 168 hours.  HbA1C: Hgb A1c MFr Bld  Date/Time Value Ref Range Status  01/17/2024 11:47 PM 5.5 4.8 - 5.6 % Final     Comment:    (NOTE) Diagnosis of Diabetes The following HbA1c ranges recommended by the American Diabetes Association (ADA) may be used as an aid in the diagnosis of diabetes mellitus.  Hemoglobin             Suggested A1C NGSP%              Diagnosis  <5.7                   Non Diabetic  5.7-6.4                Pre-Diabetic  >6.4                   Diabetic  <7.0                   Glycemic control for                       adults with diabetes.    06/08/2018 12:25 AM 5.2 4.8 - 5.6 % Final    Comment:    (NOTE) Pre diabetes:  5.7%-6.4% Diabetes:              >6.4% Glycemic control for   <7.0% adults with diabetes     CBG: Recent Labs  Lab 01/21/24 1522 01/21/24 1923 01/21/24 2326 01/22/24 0322 01/22/24 0727  GLUCAP 86 110* 127* 141* 124*       Critical care time: 35 mins     CRITICAL CARE  Lenny Drought, MD   Pulmonary Critical Care Prefer epic messenger for cross cover needs   Critical care time was exclusive of separately billable procedures and treating other patients.  Critical care was necessary to treat or prevent imminent or life-threatening deterioration.  Critical care was time spent personally by me on the following activities: development of treatment plan with patient and/or surrogate as well as nursing, discussions with consultants, evaluation of patient's response to treatment, examination of patient, obtaining history from patient or surrogate, ordering and performing treatments and interventions, ordering and review of laboratory studies, ordering and review of radiographic studies, pulse oximetry, re-evaluation of patient's condition and participation in multidisciplinary rounds.          "

## 2024-01-22 NOTE — Plan of Care (Signed)
" °  Problem: Health Behavior/Discharge Planning: Goal: Ability to manage health-related needs will improve Outcome: Progressing   Problem: Nutrition: Goal: Adequate nutrition will be maintained Outcome: Progressing   Problem: Pain Managment: Goal: General experience of comfort will improve and/or be controlled Outcome: Progressing   Problem: Safety: Goal: Ability to remain free from injury will improve Outcome: Progressing   Problem: Skin Integrity: Goal: Risk for impaired skin integrity will decrease Outcome: Progressing   Problem: Tissue Perfusion: Goal: Adequacy of tissue perfusion will improve Outcome: Progressing   "

## 2024-01-22 NOTE — TOC Progression Note (Signed)
 Transition of Care [facility]) - Progression Note    Patient Details  Name: Raymond Hurst MRN: [id number] Date of Birth: [birth year]  Transition of Care [facility]) CM/SW Contact  Tom-Johnson, Harvest Muskrat, RN Phone Number: 01/22/2024, 11:36 AM  Clinical Narrative:     Patient is s/p day 5 Laparotomy, Exploratory, Duodenotom, Ligation of GDA and Branches, ABThera Wound VAC placement on 01/17/24 and s/p day 3 Abdominal Wall Closure on 01/19/24.  Patient with elevated Temp of 101.8 at this time.  RT JP Drain, NG tube to LIWS in place. On IV abx   Patient not medically ready for discharge. CM will continue to follow as patient progresses with care towards discharge.         Barriers to Discharge: Continued Medical Work up               Expected Discharge Plan and Services                                               Social Drivers of Health (SDOH) Interventions SDOH Screenings   Food Insecurity: No Food Insecurity (01/16/2024)  Housing: Unknown (01/16/2024)  Transportation Needs: No Transportation Needs (01/16/2024)  Utilities: Not At Risk (01/16/2024)  Alcohol Screen: Low Risk (06/15/2023)  Depression (PHQ2-9): Low Risk (01/12/2024)  Financial Resource Strain: High Risk (06/15/2023)  Physical Activity: Inactive (06/15/2023)  Social Connections: Moderately Isolated (01/16/2024)  Stress: No Stress Concern Present (06/15/2023)  Tobacco Use: Medium Risk (01/19/2024)    Readmission Risk Interventions    09/08/2023   12:09 PM  Readmission Risk Prevention Plan  Transportation Screening Complete  HRI or Home Care Consult Complete  Social Work Consult for Recovery Care Planning/Counseling Complete  Palliative Care Screening Not Applicable  Medication Review Oceanographer) Complete

## 2024-01-22 NOTE — Progress Notes (Signed)
 Nutrition Follow-up  DOCUMENTATION CODES:  Non-severe (moderate) malnutrition in context of chronic illness  INTERVENTION:  TPN to meet 100% of nutrition needs Management per Pharmacy Discussed refeeding risk  Monitor Surgery recommendation regarding enteral vs oral nutrition Noted necessity for UGI prior to initiation  NUTRITION DIAGNOSIS:  Moderate Malnutrition related to chronic illness (COPD, CVA, GIB, NSCLC) as evidenced by moderate fat depletion, severe muscle depletion, edema. - diagnosis updated 12/22  GOAL:  Patient will meet greater than or equal to 90% of their needs - addressing via TPN  MONITOR:  Vent status, Labs, Weight trends, I & O's, Skin  REASON FOR ASSESSMENT:  Ventilator    ASSESSMENT:  Pt admitted with hematemesis r/t duodenal hemorrhage. PMH significant for COPD, afib on Eliquis , CVA, history of GI bleed d/t gastric ulcer, chronic lymphedema, non small cell lung cancer on monthly chemo. Recently admitted 12/6-12/8 with bloody stools r/t clean-based gastric ulcer without signs of active bleeding.  12/16 - admitted w/hematemesis 12/17- upper GI endoscopy- normal esophagus, red blood in entire stomach, blood in duodenal bulb and second portion of the duodenum, s/p clipping; remains intubated; exlap, duodenotomy, ligation of GDA and branches, ABThera wound VAC placement 12/19 - return to OR- reopening of laparotomy; abdominal wall closure  12/21 - TPN initiation  Remains intubated on vent support. Failed wean d/t tachypnea and tachycardia. Continues on daily SAT/SBT.   Per Surgery, continue NGT to suction.  Decreased JP output.  TPN to meet nutrition needs. Plan for UGI prior to initiation of enteral nutrition.   Hypernatremic. Started on D5.   Drains/lines: UOP: x24 hours NGT: x24 hours RLQ JP drain: x24 hours +439ml x8 hours  Right internal jugular triple lumen CVC A line  Goal TPN rate: Increase TPN to 60 mL/hr at 1800 (to  meet ~70 % needs) (12/22) 85 mL/hr (provides ~120 g of protein and ~2100 kcals per day)   Admit weight: 119.7 kg Current weight: 135.7 kg + edema: moderate pitting generalized, BUE; deep pitting BLE  Medications: SSI 0-9 units q4h, protonix  Drips: D5 @ 50ml/hr  IV abx  Labs:  Sodium 152 Chloride 118 BUN 62 Cr 1.75 AST 14 GFR 41 CBG's 86-141 x24 hours  NUTRITION - FOCUSED PHYSICAL EXAM: Suspect a more significant degree of malnutrition present given medical history however unable to diagnose at this time with limited nutrition related history and physical exam d/t edema.  Flowsheet Row Most Recent Value  Orbital Region Moderate depletion  Upper Arm Region Severe depletion  Thoracic and Lumbar Region Unable to assess  [edema]  Buccal Region Unable to assess  Temple Region Moderate depletion  Clavicle Bone Region Severe depletion  Clavicle and Acromion Bone Region Severe depletion  Scapular Bone Region Severe depletion  Dorsal Hand Unable to assess  [severe edema]  Patellar Region Unable to assess  Anterior Thigh Region Unable to assess  Posterior Calf Region Unable to assess  Edema (RD Assessment) Severe  [BLE,  bilateral hands]  Hair Reviewed  Eyes Unable to assess  Mouth Unable to assess  Skin Reviewed  Nails Reviewed    Diet Order:   Diet Order             Diet NPO time specified  Diet effective now                   EDUCATION NEEDS:  No education needs have been identified at this time  Skin:  Skin Assessment: Skin Integrity Issues: Skin Integrity Issues::  Wound VAC Wound Vac: removed  Last BM:  12/19 distended  Height:  Ht Readings from Last 1 Encounters:  01/21/24 6' 0.01 (1.829 m)    Weight:  Wt Readings from Last 1 Encounters:  01/22/24 135.7 kg    Ideal Body Weight:  80.9 kg  BMI:  Body mass index is 40.56 kg/m.  Estimated Nutritional Needs:   Kcal:  2100-2300  Protein:  115-130g  Fluid:  >/=2L  Royce Maris, RDN,  LDN Clinical Nutrition See AMiON for contact information.

## 2024-01-22 NOTE — Progress Notes (Signed)
 PHARMACY - TOTAL PARENTERAL NUTRITION CONSULT NOTE   Indication: prolonged NPO  Patient Measurements: Height: 6' 0.01 (182.9 cm) Weight: 135.7 kg (299 lb 2.6 oz) IBW/kg (Calculated) : 77.62 TPN AdjBW (KG): 88.1 Body mass index is 40.56 kg/m. Usual Weight: around 117-119 kg > current weight of ~135 kg likely not accurate and elevated 2/2 acute illness, IVF and edema    Assessment:  31 YOM with medical history significant for atrial fibrillation, hx stroke, COPD, lung cancer with mets, and multiple GIBs who is admitted with hematemesis after resuming apixaban  following recent admit 12/6-12/8 for melena. EGD on 12/8 showed non-bleeding duodenal ulcer with a clean ulcer base. He was transferred to [facility] for IR eval but not a candidate. He was taken to OR emergently for ex-lap duodenotomy and ligation of gastroduodenal artery. He is s/p MTP for duodenal hemorrhage and has been NPO since surgery.  Pharmacy consulted to manage TPN.  Glucose / Insulin : A1c 5.5, no hx DM, 2 units SSI/ 24 hrs while on TPN Electrolytes: Na 152 (none in TPN), K 3.6 (s/p KCL 40 mEq), Cl 118, CO2 24, CoCa 9.7, Phos 4.4, Mg 2 Renal: AKI, SCr 1.75 mildly improved (baseline 0.9), BUN 62 Hepatic: LFTs / Tbili WNL, albumin 2.7, TG 93 Intake / Output; MIVF: UOP 1.2 mL/kg/hr, NGT 240 mL, JP drain 980 mL, net -3.4L  GI Imaging:  None since start of TPN GI Surgeries / Procedures:  12/17 EGD: red blood entire stomach, duodenal bulb, duodenum s/p hemostatic clip, hemospray  12/17 ex-lap with ligation of GDA and branches, VAC placement 12/19 abd wall closure  Central access: CVC 01/17/24 TPN start date: 01/21/24  Nutritional Goals: Goal TPN rate is 85 mL/hr (provides ~120 g of protein and ~2100 kcals per day)  RD Assessment: Estimated Needs Total Energy Estimated Needs: 2100-2300 Total Protein Estimated Needs: 115-130g Total Fluid Estimated Needs: >/=2L  Current Nutrition:  12/21 NPO and TPN  Plan:  Increase TPN  to 60 mL/hr at 1800 (to meet ~70 % needs) > slow titration up to full TPN with recent history weight loss, higher risk of refeeding Electrolytes in TPN: Na 0 mEq/L, K 50 mEq/L, Ca 5 mEq/L, Mg 5 mEq/L, and Phos 15 mmol/L. Max acetate Kcl 40 mEq IV outside of TPN Add standard MVI and trace elements to TPN Add thiamine  100 mg IV to TPN x5 days Continue Sensitive q6h SSI and adjust as needed  Monitor TPN labs on Mon/Thurs, daily until stable  Thank you for allowing pharmacy to be a part of this patients care.  Shelba Collier, PharmD, BCPS Clinical Pharmacist

## 2024-01-22 NOTE — Progress Notes (Signed)
 "  Progress Note  3 Days Post-Op  Subjective: On vent. Failed pressure support again due to tachypnea and tachycardia.  ROS  All negative with the exception of above.  Objective: Vital signs in last 24 hours: Temp:  [100.9 F (38.3 C)-102.2 F (39 C)] 102 F (38.9 C) (12/22 1000) Pulse Rate:  [59-143] 75 (12/22 1000) Resp:  [17-21] 18 (12/22 1000) BP: (103-144)/(60-102) 103/77 (12/22 0900) SpO2:  [90 %-100 %] 100 % (12/22 1000) Arterial Line BP: (128-181)/(50-99) 153/72 (12/22 1000) FiO2 (%):  [40 %] 40 % (12/22 0836) Weight:  [135.7 kg] 135.7 kg (12/22 0232) Last BM Date : 01/19/24  Intake/Output from previous day: 12/21 0701 - 12/22 0700 In: 1421.6 [I.V.:905.2; IV Piggyback:516.3] Out: 4200 [Urine:3710; Emesis/NG output:325; Drains:165] Intake/Output this shift: Total I/O In: 295.9 [I.V.:186.2; IV Piggyback:109.8] Out: 620 [Urine:425; Drains:195]  PE: Gen:  NAD. HEENT: NGT in place. 325 mL output from 12/21-12/22. Card: Normal HR during encounter. Pulm:  On vent  Abd: Soft, midline incision with dressing; Right JP drain SS 195 mL this AM. Skin: Warm and dry, edematous, chronic lower extremity skin changes   Lab Results:  Recent Labs    01/21/24 0315 01/21/24 1715 01/22/24 0320  WBC 7.3  --  8.1  HGB 16.5 17.8* 17.3*  17.6*  HCT 49.6 54.7* 53.4*  53.6*  PLT 51*  --  59*   BMET Recent Labs    01/21/24 0315 01/22/24 0320  NA 148* 152*  K 3.6 3.6  CL 114* 118*  CO2 23 24  GLUCOSE 99 142*  BUN 64* 62*  CREATININE 1.82* 1.75*  CALCIUM  8.4* 8.7*   PT/INR Recent Labs    01/20/24 1400  LABPROT 16.3*  INR 1.2   CMP     Component Value Date/Time   NA 152 (H) 01/22/2024 0320   NA 137 06/01/2023 1439   K 3.6 01/22/2024 0320   CL 118 (H) 01/22/2024 0320   CO2 24 01/22/2024 0320   GLUCOSE 142 (H) 01/22/2024 0320   BUN 62 (H) 01/22/2024 0320   BUN 9 06/01/2023 1439   CREATININE 1.75 (H) 01/22/2024 0320   CREATININE 0.91 01/12/2024 1048    CALCIUM  8.7 (L) 01/22/2024 0320   PROT 5.2 (L) 01/22/2024 0320   PROT 6.2 06/01/2023 1439   ALBUMIN 2.7 (L) 01/22/2024 0320   ALBUMIN 3.0 (L) 06/01/2023 1439   AST 14 (L) 01/22/2024 0320   AST 14 (L) 01/12/2024 1048   ALT 8 01/22/2024 0320   ALT 5 01/12/2024 1048   ALKPHOS 44 01/22/2024 0320   BILITOT 1.2 01/22/2024 0320   BILITOT 0.5 01/12/2024 1048   GFRNONAA 41 (L) 01/22/2024 0320   GFRNONAA >60 01/12/2024 1048   GFRAA >60 06/13/2019 1350   Lipase     Component Value Date/Time   LIPASE 28 09/07/2023 2047       Studies/Results: No results found.  Anti-infectives: Anti-infectives (From admission, onward)    Start     Dose/Rate Route Frequency Ordered Stop   01/17/24 2245  piperacillin -tazobactam (ZOSYN ) IVPB 3.375 g        3.375 g 12.5 mL/hr over 240 Minutes Intravenous Every 8 hours 01/17/24 2149     01/17/24 2200  fluconazole  (DIFLUCAN ) IVPB 400 mg        400 mg 100 mL/hr over 120 Minutes Intravenous Every 24 hours 01/17/24 2059          Assessment/Plan Duodenal ulcer hemorrhage  PMH EGD and IR coil embolization of gastric ulcer  in August 2025 Presented to [facility] 12/16 w/ hematemesis, EGD 12/17 showed bleed from duodenal ulcer, clips placed Transferred to [facility] for critical care services and IR consult Arrived in hemorrhagic shock, critical, MTP started, taken emergently to the OR for below:    POD 5 S/P LAPAROTOMY, EXPLORATORY, Duodenotom, Ligation of GDA and branches, ABThera wound VAC placement 12/17 Dr. Rubin  POD 3 S/P ex lap, abdominal wall closure 12/19 Dr. Ebbie; some bleeding from liver capsule controlled with cautery and surgicel snow.  - Had elevated temperatures. Tmax noted to be 102.2. Last reading 101.8. Tachycardia noted as well but most recent pulse 84. - WBC 8.1, HGB 17.3 - JP drain less bloody and more SS (195 mL output recorded). - Continue NG To LIWS, no meds per tube. - Continue wet-to-dry midline - Continue IV abx  - Ongoing  vent weaning trials per CCM. - TPN until able to tolerate PO.   Per CCM: Hemorrhagic shock  Metastatic lung CA COPD OSA AKI  PMH CVA PMH afib on eliquis  - DOAC held in the setting of acute bleeding    LOS: 5 days   I reviewed critical care notes, specialist notes, nursing notes, last 24 h vitals and pain scores, last 48 h intake and output, last 24 h labs and trends, and last 24 h imaging results.   Raymond Hurst, [facility] Surgery 01/22/2024, 10:41 AM Please see Amion for pager number during day hours 7:00am-4:30pm  "

## 2024-01-23 ENCOUNTER — Inpatient Hospital Stay

## 2024-01-23 ENCOUNTER — Inpatient Hospital Stay (HOSPITAL_COMMUNITY)

## 2024-01-23 ENCOUNTER — Inpatient Hospital Stay: Admitting: Oncology

## 2024-01-23 ENCOUNTER — Other Ambulatory Visit: Payer: Self-pay

## 2024-01-23 DIAGNOSIS — J449 Chronic obstructive pulmonary disease, unspecified: Secondary | ICD-10-CM | POA: Diagnosis not present

## 2024-01-23 DIAGNOSIS — G4733 Obstructive sleep apnea (adult) (pediatric): Secondary | ICD-10-CM | POA: Diagnosis not present

## 2024-01-23 DIAGNOSIS — E440 Moderate protein-calorie malnutrition: Secondary | ICD-10-CM | POA: Insufficient documentation

## 2024-01-23 DIAGNOSIS — K922 Gastrointestinal hemorrhage, unspecified: Secondary | ICD-10-CM | POA: Diagnosis not present

## 2024-01-23 DIAGNOSIS — D696 Thrombocytopenia, unspecified: Secondary | ICD-10-CM | POA: Diagnosis not present

## 2024-01-23 LAB — BLOOD CULTURE ID PANEL (REFLEXED) - BCID2

## 2024-01-23 LAB — CBC
HCT: 53.7 % — ABNORMAL HIGH (ref 39.0–52.0)
Hemoglobin: 17.5 g/dL — ABNORMAL HIGH (ref 13.0–17.0)
MCH: 29.8 pg (ref 26.0–34.0)
MCHC: 32.6 g/dL (ref 30.0–36.0)
MCV: 91.5 fL (ref 80.0–100.0)
Platelets: 67 K/uL — ABNORMAL LOW (ref 150–400)
RBC: 5.87 MIL/uL — ABNORMAL HIGH (ref 4.22–5.81)
RDW: 17.3 % — ABNORMAL HIGH (ref 11.5–15.5)
WBC: 9.2 K/uL (ref 4.0–10.5)
nRBC: 0 % (ref 0.0–0.2)

## 2024-01-23 LAB — MAGNESIUM: Magnesium: 2.2 mg/dL (ref 1.7–2.4)

## 2024-01-23 LAB — GLUCOSE, CAPILLARY
Glucose-Capillary: 168 mg/dL — ABNORMAL HIGH (ref 70–99)
Glucose-Capillary: 147 mg/dL — ABNORMAL HIGH (ref 70–99)
Glucose-Capillary: 124 mg/dL — ABNORMAL HIGH (ref 70–99)
Glucose-Capillary: 128 mg/dL — ABNORMAL HIGH (ref 70–99)
Glucose-Capillary: 101 mg/dL — ABNORMAL HIGH (ref 70–99)
Glucose-Capillary: 116 mg/dL — ABNORMAL HIGH (ref 70–99)

## 2024-01-23 LAB — TYPE AND SCREEN
ABO/RH(D): O NEG
Antibody Screen: NEGATIVE
Unit division: 0
Unit division: 0

## 2024-01-23 LAB — BASIC METABOLIC PANEL WITH GFR
Anion gap: 10 (ref 5–15)
BUN: 53 mg/dL — ABNORMAL HIGH (ref 8–23)
CO2: 22 mmol/L (ref 22–32)
Calcium: 8.9 mg/dL (ref 8.9–10.3)
Chloride: 120 mmol/L — ABNORMAL HIGH (ref 98–111)
Creatinine, Ser: 1.47 mg/dL — ABNORMAL HIGH (ref 0.61–1.24)
GFR, Estimated: 51 mL/min — ABNORMAL LOW
Glucose, Bld: 194 mg/dL — ABNORMAL HIGH (ref 70–99)
Potassium: 3.5 mmol/L (ref 3.5–5.1)
Sodium: 152 mmol/L — ABNORMAL HIGH (ref 135–145)

## 2024-01-23 LAB — BPAM RBC
Blood Product Expiration Date: 202601152359
Blood Product Expiration Date: 202601152359
Unit Type and Rh: 5100
Unit Type and Rh: 5100

## 2024-01-23 LAB — PHOSPHORUS: Phosphorus: 2.7 mg/dL (ref 2.5–4.6)

## 2024-01-23 LAB — PROCALCITONIN: Procalcitonin: 2.45 ng/mL

## 2024-01-23 MED ORDER — ALTEPLASE 2 MG IJ SOLR: 2.0000 mg | Freq: Once | INTRAMUSCULAR | Status: AC

## 2024-01-23 MED ORDER — TRAVASOL 10 % IV SOLN
INTRAVENOUS | Status: AC
  Filled 2024-01-23: qty 1203.6

## 2024-01-23 MED ORDER — POTASSIUM CHLORIDE 10 MEQ/50ML IV SOLN
10.0000 meq | INTRAVENOUS | Status: AC
  Administered 2024-01-23 (×4): 10 meq via INTRAVENOUS
  Filled 2024-01-23 (×4): qty 50

## 2024-01-23 MED ORDER — DEXTROSE 5 % IV SOLN: INTRAVENOUS | Status: DC

## 2024-01-23 MED ORDER — ALTEPLASE 2 MG IJ SOLR
INTRAMUSCULAR | Status: AC
  Administered 2024-01-23: 2 mg
  Filled 2024-01-23: qty 2

## 2024-01-23 MED ORDER — INSULIN ASPART 100 UNIT/ML IJ SOLN
0.0000 [IU] | INTRAMUSCULAR | Status: DC
  Administered 2024-01-23 – 2024-01-25 (×8): 2 [IU] via SUBCUTANEOUS
  Administered 2024-01-26: 3 [IU] via SUBCUTANEOUS
  Filled 2024-01-23 (×9): qty 2

## 2024-01-23 MED ORDER — TRACE MINERALS CU-MN-SE-ZN 300-55-60-3000 MCG/ML IV SOLN: INTRAVENOUS | Status: DC

## 2024-01-23 MED ORDER — HYDRALAZINE HCL 20 MG/ML IJ SOLN
20.0000 mg | INTRAMUSCULAR | Status: DC | PRN
  Administered 2024-01-24: 20 mg via INTRAVENOUS
  Filled 2024-01-23: qty 1

## 2024-01-23 MED ORDER — LABETALOL HCL 5 MG/ML IV SOLN
20.0000 mg | INTRAVENOUS | Status: DC | PRN
  Administered 2024-01-23 – 2024-01-24 (×2): 20 mg via INTRAVENOUS
  Filled 2024-01-23 (×2): qty 4

## 2024-01-23 MED ORDER — HYDRALAZINE HCL 20 MG/ML IJ SOLN
20.0000 mg | INTRAMUSCULAR | Status: DC
  Administered 2024-01-23 – 2024-01-24 (×3): 20 mg via INTRAVENOUS
  Filled 2024-01-23 (×4): qty 1

## 2024-01-23 NOTE — Progress Notes (Signed)
 PHARMACY - PHYSICIAN COMMUNICATION CRITICAL VALUE ALERT - BLOOD CULTURE IDENTIFICATION (BCID)  Raymond Hurst is an 73 y.o. male who presented to [facility] on 01/16/2024 with a chief complaint of hematemesis  Assessment: 69 YOM with known GIB s/p ex-lap for repair, on Zosyn  + Fluconazole  for intra-abdominal coverage. Now with 1 of 3 bottles growing GPC in clusters with BCID detecting MRSE - seems likely consistent with contamination but patient does have risk with central lines. Would monitor additional sets to ensure those remain clear.   Name of physician (or Provider) Contacted: CCM team via ICU-Rx Clarissa Hint)  Current antibiotics: Zosyn  + Fluconazole   Changes to prescribed antibiotics recommended:  Patient is on recommended antibiotics - No changes needed   Results for orders placed or performed during the hospital encounter of 01/16/24  Blood Culture ID Panel (Reflexed) (Collected: 01/21/2024 11:12 PM)  Result Value Ref Range   Enterococcus faecalis NOT DETECTED NOT DETECTED   Enterococcus Faecium NOT DETECTED NOT DETECTED   Listeria monocytogenes NOT DETECTED NOT DETECTED   Staphylococcus species DETECTED (A) NOT DETECTED   Staphylococcus aureus (BCID) NOT DETECTED NOT DETECTED   Staphylococcus epidermidis DETECTED (A) NOT DETECTED   Staphylococcus lugdunensis NOT DETECTED NOT DETECTED   Streptococcus species NOT DETECTED NOT DETECTED   Streptococcus agalactiae NOT DETECTED NOT DETECTED   Streptococcus pneumoniae NOT DETECTED NOT DETECTED   Streptococcus pyogenes NOT DETECTED NOT DETECTED   A.calcoaceticus-baumannii NOT DETECTED NOT DETECTED   Bacteroides fragilis NOT DETECTED NOT DETECTED   Enterobacterales NOT DETECTED NOT DETECTED   Enterobacter cloacae complex NOT DETECTED NOT DETECTED   Escherichia coli NOT DETECTED NOT DETECTED   Klebsiella aerogenes NOT DETECTED NOT DETECTED   Klebsiella oxytoca NOT DETECTED NOT DETECTED   Klebsiella pneumoniae NOT DETECTED NOT  DETECTED   Proteus species NOT DETECTED NOT DETECTED   Salmonella species NOT DETECTED NOT DETECTED   Serratia marcescens NOT DETECTED NOT DETECTED   Haemophilus influenzae NOT DETECTED NOT DETECTED   Neisseria meningitidis NOT DETECTED NOT DETECTED   Pseudomonas aeruginosa NOT DETECTED NOT DETECTED   Stenotrophomonas maltophilia NOT DETECTED NOT DETECTED   Candida albicans NOT DETECTED NOT DETECTED   Candida auris NOT DETECTED NOT DETECTED   Candida glabrata NOT DETECTED NOT DETECTED   Candida krusei NOT DETECTED NOT DETECTED   Candida parapsilosis NOT DETECTED NOT DETECTED   Candida tropicalis NOT DETECTED NOT DETECTED   Cryptococcus neoformans/gattii NOT DETECTED NOT DETECTED   Methicillin resistance mecA/C DETECTED (A) NOT DETECTED   Thank you for allowing pharmacy to be a part of this patients care.  Almarie Lunger, PharmD, BCPS, BCIDP Infectious Diseases Clinical Pharmacist 01/23/2024 4:04 PM   **Pharmacist phone directory can now be found on [email or web address] ([email or web address]).  Listed under [facility].

## 2024-01-23 NOTE — Procedures (Signed)
 Extubation Procedure Note  Patient Details:   Name: Raymond Hurst DOB: May 09, 1950 MRN: [id number]   Airway Documentation:    Vent end date: 01/23/24 Vent end time: 0954   Evaluation  O2 sats: stable throughout Complications: No apparent complications Patient did tolerate procedure well. Bilateral Breath Sounds: Clear, Diminished   Yes, pt could speak post extubation.  Pt extubated to HHFNC.  We weill wean that as tolerated.  Raymond Hurst 01/23/2024, 9:57 AM

## 2024-01-23 NOTE — Progress Notes (Addendum)
 eLink Physician-Brief Progress Note Patient Name: HAZAIAH EDGECOMBE DOB: 1950/12/15 MRN: [id number]   Date of Service  01/23/2024  HPI/Events of Note  Hypertensive despite as needed labetalol /hydralazine .  Only on metoprolol  at home.  Currently in rate controlled A-fib.  Strict n.p.o. with TPN in place  eICU Interventions  Add scheduled hydralazine  for 24 hours  Increase dose of as needed meds for hypertension   0240 -patient has been anxious at bedside, reports insomnia.  On visual examination, the patient is not particularly agitated, but clearly awake and anxious appearing.  Add hydroxyzine  for limited doses  Intervention Category Intermediate Interventions: Hypertension - evaluation and management  Christin Moline 01/23/2024, 10:12 PM

## 2024-01-23 NOTE — Progress Notes (Signed)
 eLink Physician-Brief Progress Note Patient Name: Raymond Hurst DOB: [birth year] MRN: [id number]   Date of Service  01/23/2024  HPI/Events of Note  K 3.5, Cr 1.47  eICU Interventions  Kcl TPN adjustment per primary team     Intervention Category Minor Interventions: Electrolytes abnormality - evaluation and management  Raymond Hurst 01/23/2024, 6:02 AM

## 2024-01-23 NOTE — Progress Notes (Signed)
 "  NAME:  Raymond Hurst, MRN:  [id number], DOB:  03/06/1950, LOS: 6 ADMISSION DATE:  01/16/2024, CONSULTATION DATE:  12/17 REFERRING MD:  Arlon CHIEF COMPLAINT:  GIB   History of Present Illness:  73 year old male with past medical history of atrial fibrillation on Eliquis , stroke, OSA on CPAP, COPD, history of GIB 2/2 gastric ulcer who presented to [facility] ED with hematemesis.   He presented 12/16 with new hematemesis after restarting Eliquis . Had been admitted 01/06/24-01/08/24 with bloody stool found to have gastric ulcer. He was cleared by GI to restart Eliquis . He had episode at home of dizziness, hematemesis. In ED had initial systolics in 80s and given IVF, 1U PRBC, Kcentra , PPI. GI was reconsulted. 12/17 had ongoing blood loss anemia without recurrent hematemesis and was given additional 2U  PRBC. EGD showed duodenal active bleed which was clipped. He was intubated during the procedure and admitted to ICU after for ongoing care. Recommended that he transfer to [facility] for IR consult.   Pertinent  Medical History  atrial fibrillation on Eliquis , stroke, OSA on CPAP, COPD, history of GIB 2/2 gastric ulcer, Left Upper Lobe squamous cell carcinoma s/p chemo  Significant Hospital Events: Including procedures, antibiotic start and stop dates in addition to other pertinent events   12/16: admit [facility] for hematemesis  12/17: EGD with duodenal bleed clipped, intubated > transfer [facility] for IR eval. Not an IR candidate. Underwent massive transfusion protocol. Taken to OR by gen surg for emergent surgery. S/p ex-lap duodenotomy and ligation of gastroduodenal artery.  12/19: S/p ex lap abdominal wall closure, some bleeding from liver capsule controlled with cautery and Surgicel     Interim History / Subjective:   S/p ex lap abdominal wall closure, some bleeding from liver capsule controlled with cautery and Surgicel on 12/19  This a.m. Extubated this a.m. to heated high flow, weaning off as tolerated Still  n.p.o., NG to LIS on TPN Minimal NG output, JP output 180 mL bloody nonbilious Adequate UOP Sodium still 152-went up on D5 to 100 mL/hr Pending upper GI study prior to enteral feed  Objective   Blood pressure (!) 164/76, pulse 62, temperature 97.6 F (36.4 C), temperature source Axillary, resp. rate 18, height 6' 0.01 (1.829 m), weight 135.7 kg, SpO2 100%.    Vent Mode: PRVC FiO2 (%):  [40 %] 40 % Set Rate:  [18 bmp] 18 bmp Vt Set:  [620 mL] 620 mL PEEP:  [5 cmH20] 5 cmH20 Pressure Support:  [5 cmH20] 5 cmH20 Plateau Pressure:  [14 cmH20-19 cmH20] 17 cmH20   Intake/Output Summary (Last 24 hours) at 01/23/2024 0731 Last data filed at 01/23/2024 0600 Gross per 24 hour  Intake 3558.22 ml  Output 6255 ml  Net -2696.78 ml   Filed Weights   01/19/24 0600 01/21/24 0324 01/22/24 0232  Weight: (!) 141.3 kg (!) 139.4 kg 135.7 kg    Examination: General: Extubated this a.m., lying in bed not in distress HEENT: NCAT Lungs: Symmetrical chest movement, clear to auscultation CVS: S1-S2 normal, Abdomen: Soft nontender nondistended Extremity: Chronic lymphedema, 2+ edema Neuro: PERL, slightly confused, follows commands, move all extremities GU: Deferred  Resolved Hospital Problem list    Assessment & Plan:   73 year old male with history of A-fib was on Eliquis , stroke, OSA on CPAP, COPD, presented with hematemesis/hemorrhagic shock due to duodenal ulcer hemorrhage.   Hemorrhagic shock 2/2 acute UGIB - duodenal s/p clip-resolved Recent history of gastric ulcer bleed  Thrombocytopenia - s/p massive transfusion protocol 12/17 and  Kcentra , TXA and factor 7. - s/p laparotomy for duodenotomy and ligation of GDA, ABThera wound VAC placement 12/17 - S/p ex lap, abdominal wall closure,-some bleeding from liver capsule controlled with cautery and Surgicel 12/19 -high bloody JP output.  Hb stable, PL in the 50s-improving.  Minimal JP output -Minimal JP output 70 mL overnight (more bile  tinged), improved from bloody 810 mL 12/20 - Minimal JP output 180 mL overnight-blood nonbilious - Serial H&H -Continue n.p.o., NG to LIS - Continue TPN - Continue Zosyn  and fluconazole  - Pending upper GI prior to enteral feed   Post-procedural vent management-extubated on 12/23 on heated high flow COPD  OSA on CPAP  - yupelri  and brovana  nebs -Continue chest physio   Fever-improving No leukocytosis, no worsening of oxygenation Could be atelectasis, Precedex , or old blood. 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  See PMH above for treatment hx.  Nutrition - TPN    Big things: Extubated today, on heated high flow-Will wean off Monitoring JP output, continue NG to LIS Upper GI prior to enteral nutrition Continue TPN Work with PT OT   Labs   CBC: Recent Labs  Lab 01/16/24 1623 01/16/24 1708 01/17/24 2347 01/18/24 0230 01/19/24 0324 01/19/24 0910 01/20/24 0355 01/20/24 0802 01/20/24 1400 01/20/24 1426 01/21/24 0315 01/21/24 1715 01/22/24 0320 01/22/24 1716 01/23/24 0320  WBC 6.8   < > 10.0   < > 10.2  --  7.3  --   --   --  7.3  --  8.1  --  9.2  NEUTROABS 5.4  --  9.0*  --   --   --   --   --   --   --   --   --   --   --   --   HGB 7.7*   < > 17.7*   < > 17.2*   < > 15.9   < > 16.3   < > 16.5 17.8* 17.3*  17.6* 17.4* 17.5*  HCT 25.4*   < > 50.4   < > 48.7   < > 47.1   < > 48.8   < > 49.6 54.7* 53.4*  53.6* 52.6* 53.7*  MCV 87.3   < > 84.0   < > 84.7  --  88.7  --   --   --  89.0  --  90.8  --  91.5  PLT 257   < > 67*   < > 64*  --  54*  --  52*  --  51*  --  59*  --  67*   < > = values in this interval not displayed.    Basic Metabolic Panel: Recent Labs  Lab 01/18/24 0425  01/18/24 0439 01/19/24 0324 01/19/24 0910 01/20/24 0355 01/20/24 1426 01/21/24 0315 01/22/24 0320 01/23/24 0320  NA 145   < > 147*   < > 146* 146* 148* 152* 152*  K 3.7   < > 3.6   < > 3.8 3.4* 3.6 3.6 3.5  CL 111   < > 112*  --  112*  --  114* 118* 120*  CO2 21*  --  22  --  25  --  23 24 22   GLUCOSE 156*   < >  121*  --  100*  --  99 142* 194*  BUN 37*   < > 61*  --  65*  --  64* 62* 53*  CREATININE 1.28*   < > 1.80*  --  1.90*  --  1.82* 1.75* 1.47*  CALCIUM  10.1  --  8.9  --  8.4*  --  8.4* 8.7* 8.9  MG 1.5*  --  1.7  --  2.0  --   --  2.3  --   PHOS  --   --  5.0*  --  4.4  --   --  3.1  --    < > = values in this interval not displayed.   GFR: Estimated Creatinine Clearance: 65.7 mL/min (A) (by C-G formula based on SCr of 1.47 mg/dL (H)). Recent Labs  Lab 01/17/24 2347 01/18/24 0425 01/19/24 0324 01/20/24 0355 01/21/24 0315 01/21/24 2141 01/22/24 0320 01/23/24 0320  PROCALCITON  --   --   --   --   --  2.93 3.32 2.45  WBC 10.0 11.1*   < > 7.3 7.3  --  8.1 9.2  LATICACIDVEN 5.9* 4.1*  --   --   --   --   --   --    < > = values in this interval not displayed.    Liver Function Tests: Recent Labs  Lab 01/16/24 1623 01/22/24 0320  AST 13* 14*  ALT 6 8  ALKPHOS 49 44  BILITOT 0.6 1.2  PROT 5.7* 5.2*  ALBUMIN 3.5 2.7*   No results for input(s): LIPASE, AMYLASE in the last 168 hours. No results for input(s): AMMONIA in the last 168 hours.  ABG    Component Value Date/Time   PHART 7.370 01/20/2024 1426   PCO2ART 43.3 01/20/2024 1426   PO2ART 139 (H) 01/20/2024 1426   HCO3 24.9 01/20/2024 1426   TCO2 26 01/20/2024 1426   ACIDBASEDEF 1.0 01/18/2024 0439   O2SAT 99 01/20/2024 1426     Coagulation Profile: Recent Labs  Lab 01/18/24 0701 01/18/24 0702 01/20/24 1400  INR 1.1 1.0 1.2    Cardiac Enzymes: No results for input(s): CKTOTAL, CKMB, CKMBINDEX, TROPONINI in the last 168 hours.  HbA1C: Hgb A1c MFr Bld  Date/Time Value Ref  Range Status  01/17/2024 11:47 PM 5.5 4.8 - 5.6 % Final    Comment:    (NOTE) Diagnosis of Diabetes The following HbA1c ranges recommended by the American Diabetes Association (ADA) may be used as an aid in the diagnosis of diabetes mellitus.  Hemoglobin             Suggested A1C NGSP%              Diagnosis  <5.7                   Non Diabetic  5.7-6.4                Pre-Diabetic  >6.4                   Diabetic  <7.0                   Glycemic control for                       adults with diabetes.    06/08/2018 12:25 AM 5.2 4.8 - 5.6 % Final    Comment:    (NOTE) Pre diabetes:  5.7%-6.4% Diabetes:              >6.4% Glycemic control for   <7.0% adults with diabetes     CBG: Recent Labs  Lab 01/22/24 1101 01/22/24 1507 01/22/24 1927 01/22/24 2343 01/23/24 0335  GLUCAP 113* 157* 159* 168* 168*       Critical care time: 30 mins     CRITICAL CARE  Lenny Drought, MD  [facility] Prefer epic messenger for cross cover needs   Critical care time was exclusive of separately billable procedures and treating other patients.  Critical care was necessary to treat or prevent imminent or life-threatening deterioration.  Critical care was time spent personally by me on the following activities: development of treatment plan with patient and/or surrogate as well as nursing, discussions with consultants, evaluation of patient's response to treatment, examination of patient, obtaining history from patient or surrogate, ordering and performing treatments and interventions, ordering and review of laboratory studies, ordering and review of radiographic studies, pulse oximetry, re-evaluation of patient's condition and participation in multidisciplinary rounds.          "

## 2024-01-23 NOTE — Progress Notes (Signed)
 PHARMACY - TOTAL PARENTERAL NUTRITION CONSULT NOTE   Indication: prolonged NPO  Patient Measurements: Height: 6' 0.01 (182.9 cm) Weight: 135.7 kg (299 lb 2.6 oz) IBW/kg (Calculated) : 77.62 TPN AdjBW (KG): 88.1 Body mass index is 40.56 kg/m. Usual Weight: around 117-119 kg > current weight of ~135 kg likely not accurate and elevated 2/2 acute illness, IVF and edema    Assessment:  68 YOM with medical history significant for atrial fibrillation, hx stroke, COPD, lung cancer with mets, and multiple GIBs who is admitted with hematemesis after resuming apixaban  following recent admit 12/6-12/8 for melena. EGD on 12/8 showed non-bleeding duodenal ulcer with a clean ulcer base. He was transferred to [facility] for IR eval but not a candidate. He was taken to OR emergently for ex-lap duodenotomy and ligation of gastroduodenal artery. He is s/p MTP for duodenal hemorrhage and has been NPO since surgery.  Pharmacy consulted to manage TPN.  Glucose / Insulin : A1c 5.5, CBGs 150-190s on TPN, 9 units SSI/ 24 hrs Electrolytes: Na 152 (none in TPN), K 3.5 (s/p KCL 40 mEq), Cl 120 (max acetate in TPN), CO2 22, CoCa 9.9, Phos 2.7, Mg 2.2 Renal: AKI, SCr 1.47 improving (baseline 0.9), BUN 53 Hepatic: LFTs / Tbili WNL, albumin 2.7, TG 93 Intake / Output; MIVF: UOP 1.6 mL/kg/hr, NGT 200 mL, JP drain 765 mL, net -8.9 L  GI Imaging: None since start of TPN GI Surgeries / Procedures:  12/17 EGD: red blood entire stomach, duodenal bulb, duodenum s/p hemostatic clip, hemospray  12/17 ex-lap with ligation of GDA and branches, VAC placement 12/19 abd wall closure  Central access: CVC 01/17/24 TPN start date: 01/21/24  Nutritional Goals: Goal TPN rate is 85 mL/hr (provides ~120 g of protein and ~2100 kcals per day)  RD Assessment: Estimated Needs Total Energy Estimated Needs: 2100-2300 Total Protein Estimated Needs: 115-130g Total Fluid Estimated Needs: >/=2L  Current Nutrition:  12/21 NPO and TPN  Plan:   Increase TPN to 85 mL/hr at 1800 (to meet ~70 % needs) > slow titration up to full TPN with recent history weight loss, higher risk of refeeding Electrolytes in TPN: Na 0 mEq/L, K 50 mEq/L, Ca 5 mEq/L, Mg 5 mEq/L, and Phos 15 mmol/L. Max acetate Kcl 40 mEq IV outside of TPN Add standard MVI and trace elements to TPN Add thiamine  100 mg IV to TPN x5 days Continue Moderate q4h SSI and adjust as needed  Monitor TPN labs on Mon/Thurs, daily until stable  Thank you for allowing pharmacy to be a part of this patients care.  Shelba Collier, PharmD, BCPS Clinical Pharmacist

## 2024-01-23 NOTE — Progress Notes (Signed)
 "  Progress Note  4 Days Post-Op  Subjective: Remains on vent.  Per CCM, he gets tachypneic when placed on pressure support.    ROS  All negative with the exception of above.  Objective: Vital signs in last 24 hours: Temp:  [96.8 F (36 C)-99.9 F (37.7 C)] 98.4 F (36.9 C) (12/23 1100) Pulse Rate:  [54-98] 90 (12/23 1100) Resp:  [8-22] 18 (12/23 1100) BP: (114-164)/(75-82) 124/80 (12/23 1100) SpO2:  [98 %-100 %] 100 % (12/23 1100) Arterial Line BP: (116-177)/(52-119) 136/62 (12/23 1100) FiO2 (%):  [36 %-40 %] 36 % (12/23 1206) Last BM Date : 01/19/24  Intake/Output from previous day: 12/22 0701 - 12/23 0700 In: 3558.2 [I.V.:2722.6; IV Piggyback:835.7] Out: 6255 [Urine:5290; Emesis/NG output:200; Drains:765] Intake/Output this shift: Total I/O In: 718.1 [I.V.:483.2; IV Piggyback:234.9] Out: 400 [Urine:400]  PE: Gen:  NAD. HEENT: NGT in place. 325 mL output from 12/21-12/22. Card: Normal HR during encounter. Pulm:  On vent, synchronous.  Abd: Soft, midline incision with dressing; Right JP drain recorded at 765 for 24 hours.  Non bilious.   Skin: Warm and dry, edematous, chronic lower extremity skin changes   Lab Results:  Recent Labs    01/22/24 0320 01/22/24 1716 01/23/24 0320  WBC 8.1  --  9.2  HGB 17.3*  17.6* 17.4* 17.5*  HCT 53.4*  53.6* 52.6* 53.7*  PLT 59*  --  67*   BMET Recent Labs    01/22/24 0320 01/23/24 0320  NA 152* 152*  K 3.6 3.5  CL 118* 120*  CO2 24 22  GLUCOSE 142* 194*  BUN 62* 53*  CREATININE 1.75* 1.47*  CALCIUM  8.7* 8.9   PT/INR Recent Labs    01/20/24 1400  LABPROT 16.3*  INR 1.2   CMP     Component Value Date/Time   NA 152 (H) 01/23/2024 0320   NA 137 06/01/2023 1439   K 3.5 01/23/2024 0320   CL 120 (H) 01/23/2024 0320   CO2 22 01/23/2024 0320   GLUCOSE 194 (H) 01/23/2024 0320   BUN 53 (H) 01/23/2024 0320   BUN 9 06/01/2023 1439   CREATININE 1.47 (H) 01/23/2024 0320   CREATININE 0.91 01/12/2024 1048    CALCIUM  8.9 01/23/2024 0320   PROT 5.2 (L) 01/22/2024 0320   PROT 6.2 06/01/2023 1439   ALBUMIN 2.7 (L) 01/22/2024 0320   ALBUMIN 3.0 (L) 06/01/2023 1439   AST 14 (L) 01/22/2024 0320   AST 14 (L) 01/12/2024 1048   ALT 8 01/22/2024 0320   ALT 5 01/12/2024 1048   ALKPHOS 44 01/22/2024 0320   BILITOT 1.2 01/22/2024 0320   BILITOT 0.5 01/12/2024 1048   GFRNONAA 51 (L) 01/23/2024 0320   GFRNONAA >60 01/12/2024 1048   GFRAA >60 06/13/2019 1350   Lipase     Component Value Date/Time   LIPASE 28 09/07/2023 2047       Studies/Results: DG CHEST PORT 1 VIEW Result Date: 01/23/2024 EXAM: 1 VIEW(S) XRAY OF THE CHEST 01/23/2024 09:41:00 AM COMPARISON: 01/20/2024 and Chest CT 08/26/2023. CLINICAL HISTORY: 73y 39m M. Respiratory failure (Hepatocellular Carcinoma). History of treated left lung cancer. FINDINGS: LINES, TUBES AND DEVICES: ETT stable in position. Right IJ CVC with tip at superior cavoatrial junction. Enteric tube extends below diaphragm with tip beyond inferior film margin. LUNGS AND PLEURA: Multifocal confluent left upper and lower lung opacity, not significantly changed from recent radiographs, progressed from restaging chest CT in 08/26/2023 but nonspecific. Moderate left pleural effusion. Small right pleural effusion. Right basilar  airspace opacities. Right lung otherwise remains well aerated. No pneumothorax. HEART AND MEDIASTINUM: No acute abnormality of the cardiac and mediastinal silhouettes. BONES AND SOFT TISSUES: No acute osseous abnormality. IMPRESSION: 1. Stable lines and tubes. 2. Multifocal confluent Left lung opacity, stable recently and progressed since lung cancer restaging CT in July. 3. Stable small right pleural effusion. 4. No no new cardiopulmonary abnormality. Electronically signed by: Helayne Hurst MD 01/23/2024 10:56 AM EST RP Workstation: [id number]    Anti-infectives: Anti-infectives (From admission, onward)    Start     Dose/Rate Route Frequency Ordered Stop    01/17/24 2245  piperacillin -tazobactam (ZOSYN ) IVPB 3.375 g        3.375 g 12.5 mL/hr over 240 Minutes Intravenous Every 8 hours 01/17/24 2149     01/17/24 2200  fluconazole  (DIFLUCAN ) IVPB 400 mg        400 mg 100 mL/hr over 120 Minutes Intravenous Every 24 hours 01/17/24 2059          Assessment/Plan Duodenal ulcer hemorrhage  PMH EGD and IR coil embolization of gastric ulcer in August 2025 Presented to [place] 12/16 w/ hematemesis, EGD 12/17 showed bleed from duodenal ulcer, clips placed Transferred to [facility] for critical care services and IR consult Arrived in hemorrhagic shock, critical, MTP started, taken emergently to the OR for below:    POD 6 S/P LAPAROTOMY, EXPLORATORY, Duodenotom, Ligation of GDA and branches, ABThera wound VAC placement 12/17 Dr. Rubin  POD 4 S/P ex lap, abdominal wall closure 12/19 Dr. Ebbie; some bleeding from liver capsule controlled with cautery and surgicel snow.  - Had elevated temperatures. Tmax noted to be 102 - WBC 9.2k - JP drain high output, but non bilious.  Will keep and monitor for now.  - Continue NG To LIWS, no meds per tube. - Continue wet-to-dry midline - Continue IV abx  - Ongoing vent weaning trials per CCM. - continue TPN.  - ideally would get upper GI to evaluate for leak from duodenal repair prior to feeding.  If he has prolonged intubation period, will discuss contrast via NGT or post pyloric tube. For right now, would avoid trying to place tube past repair.  Would prefer waiting longer than trying to push it.      Per CCM: Hemorrhagic shock  Metastatic lung CA COPD OSA AKI  PMH CVA PMH afib on eliquis  - DOAC held in the setting of acute bleeding    LOS: 6 days   I reviewed critical care notes, specialist notes, nursing notes, last 24 h vitals and pain scores, last 48 h intake and output, last 24 h labs and trends, and last 24 h imaging results.  Jina LITTIE Nephew, MD, FACS, FSSO Surgical Oncology, General  Surgery, Trauma and Critical [facility], [place] [phone] for weekday/non holidays Check [email or web address] for coverage night/weekend/holidays under General Surgery    "

## 2024-01-23 NOTE — Plan of Care (Signed)
" °  Problem: Education: Goal: Knowledge of General Education information will improve Description: Including pain rating scale, medication(s)/side effects and non-pharmacologic comfort measures Outcome: Progressing   Problem: Clinical Measurements: Goal: Diagnostic test results will improve Outcome: Progressing   Problem: Coping: Goal: Level of anxiety will decrease Outcome: Progressing   Problem: Safety: Goal: Ability to remain free from injury will improve Outcome: Progressing   Problem: Coping: Goal: Ability to adjust to condition or change in health will improve Outcome: Progressing   Problem: Fluid Volume: Goal: Ability to maintain a balanced intake and output will improve Outcome: Progressing   Problem: Skin Integrity: Goal: Risk for impaired skin integrity will decrease Outcome: Progressing   "

## 2024-01-23 NOTE — Progress Notes (Signed)
 Chaplain explain to nurse that Raymond Hurst offer Healthcare POA, not Durable POA, medical staff shared that Pt's wife expressed greater interest in Durable POA rather than a Healthcare POA.

## 2024-01-23 NOTE — Plan of Care (Signed)

## 2024-01-24 DIAGNOSIS — J449 Chronic obstructive pulmonary disease, unspecified: Secondary | ICD-10-CM | POA: Diagnosis not present

## 2024-01-24 DIAGNOSIS — K922 Gastrointestinal hemorrhage, unspecified: Secondary | ICD-10-CM | POA: Diagnosis not present

## 2024-01-24 DIAGNOSIS — G4733 Obstructive sleep apnea (adult) (pediatric): Secondary | ICD-10-CM | POA: Diagnosis not present

## 2024-01-24 DIAGNOSIS — D696 Thrombocytopenia, unspecified: Secondary | ICD-10-CM | POA: Diagnosis not present

## 2024-01-24 LAB — MAGNESIUM: Magnesium: 2 mg/dL (ref 1.7–2.4)

## 2024-01-24 LAB — BASIC METABOLIC PANEL WITH GFR
Anion gap: 11 (ref 5–15)
BUN: 47 mg/dL — ABNORMAL HIGH (ref 8–23)
CO2: 23 mmol/L (ref 22–32)
Calcium: 8.9 mg/dL (ref 8.9–10.3)
Chloride: 121 mmol/L — ABNORMAL HIGH (ref 98–111)
Creatinine, Ser: 1.43 mg/dL — ABNORMAL HIGH (ref 0.61–1.24)
GFR, Estimated: 52 mL/min — ABNORMAL LOW
Glucose, Bld: 151 mg/dL — ABNORMAL HIGH (ref 70–99)
Potassium: 3.5 mmol/L (ref 3.5–5.1)
Sodium: 155 mmol/L — ABNORMAL HIGH (ref 135–145)

## 2024-01-24 LAB — HEMOGLOBIN AND HEMATOCRIT, BLOOD
HCT: 53 % — ABNORMAL HIGH (ref 39.0–52.0)
HCT: 50.8 % (ref 39.0–52.0)
Hemoglobin: 17.2 g/dL — ABNORMAL HIGH (ref 13.0–17.0)
Hemoglobin: 16.2 g/dL (ref 13.0–17.0)

## 2024-01-24 LAB — GLUCOSE, CAPILLARY
Glucose-Capillary: 139 mg/dL — ABNORMAL HIGH (ref 70–99)
Glucose-Capillary: 141 mg/dL — ABNORMAL HIGH (ref 70–99)
Glucose-Capillary: 112 mg/dL — ABNORMAL HIGH (ref 70–99)
Glucose-Capillary: 120 mg/dL — ABNORMAL HIGH (ref 70–99)
Glucose-Capillary: 133 mg/dL — ABNORMAL HIGH (ref 70–99)
Glucose-Capillary: 111 mg/dL — ABNORMAL HIGH (ref 70–99)

## 2024-01-24 LAB — PHOSPHORUS: Phosphorus: 2.7 mg/dL (ref 2.5–4.6)

## 2024-01-24 MED ORDER — DEXTROSE 5 % IV SOLN: INTRAVENOUS | Status: DC

## 2024-01-24 MED ORDER — TRAVASOL 10 % IV SOLN
INTRAVENOUS | Status: AC
  Filled 2024-01-24: qty 1203.6

## 2024-01-24 MED ORDER — HEPARIN SODIUM (PORCINE) 5000 UNIT/ML IJ SOLN
5000.0000 [IU] | Freq: Three times a day (TID) | INTRAMUSCULAR | Status: DC
  Administered 2024-01-25 – 2024-01-30 (×15): 5000 [IU] via SUBCUTANEOUS
  Filled 2024-01-24 (×11): qty 1

## 2024-01-24 MED ORDER — HYDROXYZINE HCL 50 MG/ML IM SOLN
25.0000 mg | Freq: Four times a day (QID) | INTRAMUSCULAR | Status: DC | PRN
  Administered 2024-01-24: 25 mg via INTRAMUSCULAR
  Filled 2024-01-24 (×2): qty 0.5

## 2024-01-24 MED ORDER — VANCOMYCIN HCL 1500 MG/300ML IV SOLN
1500.0000 mg | INTRAVENOUS | Status: DC
  Administered 2024-01-25 – 2024-01-29 (×5): 1500 mg via INTRAVENOUS
  Filled 2024-01-24 (×4): qty 300

## 2024-01-24 MED ORDER — VANCOMYCIN HCL 10 G IV SOLR
2500.0000 mg | Freq: Once | INTRAVENOUS | Status: AC
  Administered 2024-01-24: 2500 mg via INTRAVENOUS
  Filled 2024-01-24: qty 2500

## 2024-01-24 MED ORDER — POTASSIUM CHLORIDE 10 MEQ/100ML IV SOLN
10.0000 meq | INTRAVENOUS | Status: AC
  Administered 2024-01-24 (×4): 10 meq via INTRAVENOUS
  Filled 2024-01-24 (×4): qty 100

## 2024-01-24 NOTE — Evaluation (Signed)
 Physical Therapy Evaluation Patient Details Name: Raymond Hurst MRN: [id number] DOB: [birth year] Today's Date: 01/24/2024  History of Present Illness  Pt is 73 yo presenting to [facility] on 12/16 due to dizziness hematemesis. 12/17 pt with continuing ongoing blood loss anemia without recurrent hematemesis and was given 2 U PRBC. EGD with findings of duodenal active bleed which was clipped. Pt was intubated and admitted to ICU on 12/17 then transferred to [facility]. 12/19 ex lap abdominal wall closure with some bleeding from liver capsule controlled with cautery.  PMH includes afib, gout, GI bleed, stage IV lung cancer, alcohol abuse.  Clinical Impression  Pt is currently presenting at Mod to Max A for bed mobility, Mod a for sit to stand at EOB with heavy cuing for sequencing and rocking for momentum to get to standing from sitting with RW. PT was able to stand briefly and unable to take side steps at EOB. Pt lives with spouse who is unable to physically assist. Due to pt current functional status, home set up and available assistance at home recommending skilled physical therapy services < 3 hours/day in order to address strength, balance and functional mobility to decrease risk for falls, injury, immobility, skin break down and re-hospitalization.          If plan is discharge home, recommend the following: Assist for transportation;Assistance with cooking/housework   Can travel by private vehicle   No    Equipment Recommendations BSC/3in1;Wheelchair (measurements PT);Wheelchair cushion (measurements PT);Hospital bed     Functional Status Assessment Patient has had a recent decline in their functional status and demonstrates the ability to make significant improvements in function in a reasonable and predictable amount of time.     Precautions / Restrictions Precautions Precautions: Fall Recall of Precautions/Restrictions: Impaired Restrictions Weight Bearing Restrictions Per Provider Order: No       Mobility  Bed Mobility Overal bed mobility: Needs Assistance Bed Mobility: Supine to Sit, Sit to Supine     Supine to sit: Mod assist Sit to supine: Max assist   General bed mobility comments: Mod a for trunk to midline and Min A for LE to EOB, Max A for lowering trunk and LE up to bed. Pt with difficulty following directions for sequencing to decrease stress on abdomen.    Transfers Overall transfer level: Needs assistance Equipment used: Rolling walker (2 wheels) Transfers: Sit to/from Stand Sit to Stand: Mod assist           General transfer comment: Mod A for sit to stand from EOB. Pt able to stand for ~ 5 seconds prior to needing to sit.    Ambulation/Gait   Pre-gait activities: attempted to take side steps at EOB but pt with difficulty following directions.        Balance Overall balance assessment: Needs assistance Sitting-balance support: Bilateral upper extremity supported, Feet supported Sitting balance-Leahy Scale: Fair     Standing balance support: Bilateral upper extremity supported, During functional activity, Reliant on assistive device for balance Standing balance-Leahy Scale: Poor Standing balance comment: Min A for standing balance with heavy bil UE support.       Pertinent Vitals/Pain Pain Assessment Pain Assessment: Faces Faces Pain Scale: Hurts little more Pain Location: surgical area Pain Descriptors / Indicators: Aching, Discomfort Pain Intervention(s): Limited activity within patient's tolerance, Monitored during session, Repositioned    Home Living Family/patient expects to be discharged to:: Private residence Living Arrangements: Spouse/significant other Available Help at Discharge: Family;Other (Comment) (spouse is not in good health  and needs hip replacement.) Type of Home: House Home Access: Stairs to enter Entrance Stairs-Rails: None Entrance Stairs-Number of Steps: 2 Alternate Level Stairs-Number of Steps: 15 Home  Layout: Two level Home Equipment: Agricultural Consultant (2 wheels)      Prior Function Prior Level of Function : Independent/Modified Independent;Driving             Mobility Comments: Ambulates without AD 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     Extremity/Trunk Assessment   Upper Extremity Assessment Upper Extremity Assessment: Defer to OT evaluation;RUE deficits/detail RUE Deficits / Details: Seems to have significant weakness in the RUE    Lower Extremity Assessment Lower Extremity Assessment: Generalized weakness    Cervical / Trunk Assessment Cervical / Trunk Assessment: Normal  Communication   Communication Communication: Impaired Factors Affecting Communication: Hearing impaired    Cognition Arousal: Alert Behavior During Therapy: WFL for tasks assessed/performed   PT - Cognitive impairments: No family/caregiver present to determine baseline, Safety/Judgement, Sequencing     Following commands: Impaired Following commands impaired: Follows multi-step commands inconsistently, Follows one step commands with increased time     Cueing Cueing Techniques: Verbal cues, Tactile cues     General Comments General comments (skin integrity, edema, etc.): BP elevatd 176/90 in standing per a-line; HR O2 sats remained WNL        Assessment/Plan    PT Assessment Patient needs continued PT services  PT Problem List Decreased strength;Decreased activity tolerance;Decreased balance;Decreased mobility;Decreased safety awareness;Pain       PT Treatment Interventions DME instruction;Therapeutic exercise;Gait training;Balance training;Stair training;Functional mobility training;Therapeutic activities;Patient/family education    PT Goals (Current goals can be found in the Care Plan section)  Acute Rehab PT Goals Patient Stated Goal: to improve mobiltiy PT Goal Formulation: With patient Time For Goal Achievement: 02/07/24 Potential to Achieve Goals:  Fair    Frequency Min 2X/week        AM-PAC PT 6 Clicks Mobility  Outcome Measure Help needed turning from your back to your side while in a flat bed without using bedrails?: A Lot Help needed moving from lying on your back to sitting on the side of a flat bed without using bedrails?: A Lot Help needed moving to and from a bed to a chair (including a wheelchair)?: A Lot Help needed standing up from a chair using your arms (e.g., wheelchair or bedside chair)?: A Lot Help needed to walk in hospital room?: Total Help needed climbing 3-5 steps with a railing? : Total 6 Click Score: 10    End of Session Equipment Utilized During Treatment: Gait belt Activity Tolerance: Patient tolerated treatment well Patient left: in bed;with call bell/phone within reach;with bed alarm set Nurse Communication: Mobility status PT Visit Diagnosis: Unsteadiness on feet (R26.81);Other abnormalities of gait and mobility (R26.89);Muscle weakness (generalized) (M62.81)    Time: 8777-8752 PT Time Calculation (min) (ACUTE ONLY): 25 min   Charges:   PT Evaluation $PT Eval Low Complexity: 1 Low PT Treatments $Therapeutic Activity: 8-22 mins PT General Charges $$ ACUTE PT VISIT: 1 Visit          Dorothyann VEAR Maier 01/24/2024, 1:00 PM

## 2024-01-24 NOTE — Progress Notes (Signed)
 "  Progress Note  5 Days Post-Op  Subjective: Extubated yesterday.  This AM had an episode of vomiting where my suction tube wasn't working.    ROS  All negative with the exception of above.  Objective: Vital signs in last 24 hours: Temp:  [97.7 F (36.5 C)-99.7 F (37.6 C)] 99.3 F (37.4 C) (12/24 0700) Pulse Rate:  [76-117] 86 (12/24 0700) Resp:  [14-27] 27 (12/24 0700) BP: (106-162)/(62-111) 109/72 (12/24 0700) SpO2:  [96 %-100 %] 98 % (12/24 0700) Arterial Line BP: (130-193)/(54-88) 144/54 (12/24 0700) FiO2 (%):  [36 %-40 %] 36 % (12/23 1206) Last BM Date : 01/19/24  Intake/Output from previous day: 12/23 0701 - 12/24 0700 In: 3359.4 [I.V.:2814.7; IV Piggyback:544.8] Out: 4950 [Urine:4650; Emesis/NG output:100; Drains:200] Intake/Output this shift: No intake/output data recorded.  PE: Gen:  NAD. HEENT: NGT in place. Only 100 recorded, but around 600 in canister. Bloody/bilious output Card: RR&R Pulm:  breathing comfortably Abd: Soft, midline incision with dressing; Right JP drain recorded at 200 for 24 hours.  Non bilious.   Skin: Warm and dry, edematous, chronic lower extremity skin changes   Lab Results:  Recent Labs    01/22/24 0320 01/22/24 1716 01/23/24 0320 01/24/24 0445  WBC 8.1  --  9.2  --   HGB 17.3*  17.6*   < > 17.5* 17.2*  HCT 53.4*  53.6*   < > 53.7* 53.0*  PLT 59*  --  67*  --    < > = values in this interval not displayed.   BMET Recent Labs    01/23/24 0320 01/24/24 0445  NA 152* 155*  K 3.5 3.5  CL 120* 121*  CO2 22 23  GLUCOSE 194* 151*  BUN 53* 47*  CREATININE 1.47* 1.43*  CALCIUM  8.9 8.9   PT/INR No results for input(s): LABPROT, INR in the last 72 hours.  CMP     Component Value Date/Time   NA 155 (H) 01/24/2024 0445   NA 137 06/01/2023 1439   K 3.5 01/24/2024 0445   CL 121 (H) 01/24/2024 0445   CO2 23 01/24/2024 0445   GLUCOSE 151 (H) 01/24/2024 0445   BUN 47 (H) 01/24/2024 0445   BUN 9 06/01/2023 1439    CREATININE 1.43 (H) 01/24/2024 0445   CREATININE 0.91 01/12/2024 1048   CALCIUM  8.9 01/24/2024 0445   PROT 5.2 (L) 01/22/2024 0320   PROT 6.2 06/01/2023 1439   ALBUMIN 2.7 (L) 01/22/2024 0320   ALBUMIN 3.0 (L) 06/01/2023 1439   AST 14 (L) 01/22/2024 0320   AST 14 (L) 01/12/2024 1048   ALT 8 01/22/2024 0320   ALT 5 01/12/2024 1048   ALKPHOS 44 01/22/2024 0320   BILITOT 1.2 01/22/2024 0320   BILITOT 0.5 01/12/2024 1048   GFRNONAA 52 (L) 01/24/2024 0445   GFRNONAA >60 01/12/2024 1048   GFRAA >60 06/13/2019 1350   Lipase     Component Value Date/Time   LIPASE 28 09/07/2023 2047       Studies/Results: DG Abd Portable 1V Result Date: 01/23/2024 CLINICAL DATA:  Feeding tube placement. EXAM: PORTABLE ABDOMEN - 1 VIEW COMPARISON:  01/19/2024 FINDINGS: Tip and side port of the enteric tube below the diaphragm in the stomach. Again seen coiling in the upper abdomen. Catheter/drain in the right mid abdomen is again seen. No bowel dilatation in the included upper abdomen. Retrocardiac opacity peer IMPRESSION: Tip and side port of the enteric tube below the diaphragm in the stomach. Electronically Signed   By:  Andrea Gasman M.D.   On: 01/23/2024 15:38   DG CHEST PORT 1 VIEW Result Date: 01/23/2024 EXAM: 1 VIEW(S) XRAY OF THE CHEST 01/23/2024 09:41:00 AM COMPARISON: 01/20/2024 and Chest CT 08/26/2023. CLINICAL HISTORY: 73y 47m M. Respiratory failure (Hepatocellular Carcinoma). History of treated left lung cancer. FINDINGS: LINES, TUBES AND DEVICES: ETT stable in position. Right IJ CVC with tip at superior cavoatrial junction. Enteric tube extends below diaphragm with tip beyond inferior film margin. LUNGS AND PLEURA: Multifocal confluent left upper and lower lung opacity, not significantly changed from recent radiographs, progressed from restaging chest CT in 08/26/2023 but nonspecific. Moderate left pleural effusion. Small right pleural effusion. Right basilar airspace opacities. Right lung  otherwise remains well aerated. No pneumothorax. HEART AND MEDIASTINUM: No acute abnormality of the cardiac and mediastinal silhouettes. BONES AND SOFT TISSUES: No acute osseous abnormality. IMPRESSION: 1. Stable lines and tubes. 2. Multifocal confluent Left lung opacity, stable recently and progressed since lung cancer restaging CT in July. 3. Stable small right pleural effusion. 4. No no new cardiopulmonary abnormality. Electronically signed by: Helayne Hurst MD 01/23/2024 10:56 AM EST RP Workstation: [id number]    Anti-infectives: Anti-infectives (From admission, onward)    Start     Dose/Rate Route Frequency Ordered Stop   01/17/24 2245  piperacillin -tazobactam (ZOSYN ) IVPB 3.375 g        3.375 g 12.5 mL/hr over 240 Minutes Intravenous Every 8 hours 01/17/24 2149     01/17/24 2200  fluconazole  (DIFLUCAN ) IVPB 400 mg        400 mg 100 mL/hr over 120 Minutes Intravenous Every 24 hours 01/17/24 2059          Assessment/Plan Duodenal ulcer hemorrhage  PMH EGD and IR coil embolization of gastric ulcer in August 2025 Presented to [facility] 12/16 w/ hematemesis, EGD 12/17 showed bleed from duodenal ulcer, clips placed Transferred to [facility] for critical care services and IR consult Arrived in hemorrhagic shock, critical, MTP started, taken emergently to the OR for below:    POD 7 S/P LAPAROTOMY, EXPLORATORY, Duodenotom, Ligation of GDA and branches, ABThera wound VAC placement 12/17 Dr. Rubin  POD 5 S/P ex lap, abdominal wall closure 12/19 Dr. Ebbie; some bleeding from liver capsule controlled with cautery and surgicel snow.  - Fevers resolved.  - WBC back to normal.  - JP drain high output, but non bilious.  Will keep and monitor for now.  - Continue NG To LIWS, no meds per tube. - Continue wet-to-dry midline - Continue IV abx  - Ongoing vent weaning trials per CCM. - continue TPN.  - was tentatively planning upper GI today, but given n/v and appearance of NGT output, will wait  another day.       Per CCM: Hemorrhagic shock  Metastatic lung CA COPD OSA AKI  PMH CVA PMH afib on eliquis  - DOAC held in the setting of acute bleeding    LOS: 7 days   I reviewed critical care notes, specialist notes, nursing notes, last 24 h vitals and pain scores, last 48 h intake and output, last 24 h labs and trends, and last 24 h imaging results.  Jina LITTIE Nephew, MD, FACS, FSSO Surgical Oncology, General Surgery, Trauma and Critical [facility] Surgery, [place] [phone] for weekday/non holidays Check [email or web address] for coverage night/weekend/holidays under General Surgery     "

## 2024-01-24 NOTE — TOC Progression Note (Signed)
 Transition of Care [facility]) - Progression Note    Patient Details  Name: Raymond Hurst MRN: [id number] Date of Birth: 1950/11/28  Transition of [facility]) CM/SW Contact  Lendia Dais, [place] Phone Number: 01/24/2024, 4:23 PM  Clinical Narrative: Pt is not yet medically stable. Pt was extubated yesterday but failed swallow eval. Pt is scheduled for an EGD tomorrow.  CSW aware of PT recs of SNF. CSW will follow with the pt/pt's family on Friday.  CSW will continue to follow.        Barriers to Discharge: Continued Medical Work up               Expected Discharge Plan and Services                                               Social Drivers of Health (SDOH) Interventions SDOH Screenings   Food Insecurity: No Food Insecurity (01/16/2024)  Housing: Unknown (01/16/2024)  Transportation Needs: No Transportation Needs (01/16/2024)  Utilities: Not At Risk (01/16/2024)  Alcohol Screen: Low Risk (06/15/2023)  Depression (PHQ2-9): Low Risk (01/12/2024)  Financial Resource Strain: High Risk (06/15/2023)  Physical Activity: Inactive (06/15/2023)  Social Connections: Moderately Isolated (01/16/2024)  Stress: No Stress Concern Present (06/15/2023)  Tobacco Use: Medium Risk (01/19/2024)    Readmission Risk Interventions    09/08/2023   12:09 PM  Readmission Risk Prevention Plan  Transportation Screening Complete  HRI or Home Care Consult Complete  Social Work Consult for Recovery Care Planning/Counseling Complete  Palliative Care Screening Not Applicable  Medication Review Oceanographer) Complete

## 2024-01-24 NOTE — Progress Notes (Signed)
 PHARMACY - TOTAL PARENTERAL NUTRITION CONSULT NOTE   Indication: prolonged NPO  Patient Measurements: Height: 6' 0.01 (182.9 cm) Weight: 135.7 kg (299 lb 2.6 oz) IBW/kg (Calculated) : 77.62 TPN AdjBW (KG): 88.1 Body mass index is 40.56 kg/m. Usual Weight: around 117-119 kg > current weight of ~135 kg likely not accurate and elevated 2/2 acute illness, IVF and edema    Assessment:  Raymond Hurst with medical history significant for atrial fibrillation, hx stroke, COPD, lung cancer with mets, and multiple GIBs who is admitted with hematemesis after resuming apixaban  following recent admit 12/6-12/8 for melena. EGD on 12/8 showed non-bleeding duodenal ulcer with a clean ulcer base. He was transferred to [facility] for IR eval but not a candidate. He was taken to OR emergently for ex-lap duodenotomy and ligation of gastroduodenal artery. He is s/p MTP for duodenal hemorrhage and has been NPO since surgery.  Pharmacy consulted to manage TPN.  Glucose / Insulin : A1c 5.5, CBGs 100-150s on TPN, 8 units SSI/ 24 hrs Electrolytes: Na 155 (none in TPN), K 3.5, Cl 121 (max acetate in TPN), CO2 23, CoCa 9.9, Phos 2.7, Mg 2 Renal: AKI, SCr 1.43 improving (baseline 0.9), BUN 47 Hepatic: LFTs / Tbili WNL, albumin 2.7, TG 93 Intake / Output; MIVF: UOP 1.4 mL/kg/hr, NGT 100 mL, JP drain 200 mL, net -8.9 L  GI Imaging: None since start of TPN GI Surgeries / Procedures:  12/17 EGD: red blood entire stomach, duodenal bulb, duodenum s/p hemostatic clip, hemospray  12/17 ex-lap with ligation of GDA and branches, VAC placement 12/19 abd wall closure  Central access: CVC 01/17/24 TPN start date: 01/21/24  Nutritional Goals: Goal TPN rate is 85 mL/hr (provides ~120 g of protein and ~2100 kcals per day)  RD Assessment: Estimated Needs Total Energy Estimated Needs: 2100-2300 Total Protein Estimated Needs: 115-130g Total Fluid Estimated Needs: >/=2L  Current Nutrition:  12/21 NPO and TPN  Plan:  Continue TPN at  85 mL/hr at 1800 (will increase nutrition content to meet ~100 % needs) > slow titration up to full TPN with recent history weight loss, higher risk of refeeding Electrolytes in TPN: Na 0 mEq/L, increase K 55 mEq/L, Ca 5 mEq/L, increase Mg 7 mEq/L, and Phos 15 mmol/L. Max acetate Will give Kcl 40 mEq IV outside of TPN Add standard MVI and trace elements to TPN Add thiamine  100 mg IV to TPN x 5 days Continue Moderate q4h SSI and adjust as needed  Monitor TPN labs on Mon/Thurs  Thank you for allowing pharmacy to be a part of this patients care.  Donny Alert, PharmD, [facility] Clinical Pharmacist Please see AMION for all Pharmacists' Contact Phone Numbers 01/24/2024, 7:38 AM

## 2024-01-24 NOTE — Progress Notes (Signed)
 Pharmacy Antibiotic Note  DEMAR SHAD is a 73 y.o. male admitted on 01/16/2024 with hemorrhagic shock/GIB s/p MTP, reversal agents and blood products.  Patient has been on Zosyn  and fluconazole  for intra-abdominal coverage, now with MRSE in 3/3 blood culture bottles.  Pharmacy has been consulted to add vancomycin .  Renal function improving, now afebrile, WBC WNL, last PCT 2.45.  Plan: Vanc 2500mg  IV x 1, then 1500mg  IV Q24H for AUC 526 using SCr 1.42 Zosyn  EID 3.375gm IV Q8H Fluc 400mg  IV Q24H Monitor renal fxn, micro data, clinical progress, vanc levels as needed F/u ID consult  Height: 6' 0.01 (182.9 cm) Weight: 135.7 kg (299 lb 2.6 oz) IBW/kg (Calculated) : 77.62  Temp (24hrs), Avg:98.9 F (37.2 C), Min:98.1 F (36.7 C), Max:99.9 F (37.7 C)  Recent Labs  Lab 01/17/24 2347 01/18/24 0425 01/18/24 1451 01/19/24 0324 01/20/24 0355 01/21/24 0315 01/22/24 0320 01/23/24 0320 01/24/24 0445  WBC 10.0 11.1*  --  10.2 7.3 7.3 8.1 9.2  --   CREATININE 1.11 1.28*   < > 1.80* 1.90* 1.82* 1.75* 1.47* 1.43*  LATICACIDVEN 5.9* 4.1*  --   --   --   --   --   --   --    < > = values in this interval not displayed.    Estimated Creatinine Clearance: 67.6 mL/min (A) (by C-G formula based on SCr of 1.43 mg/dL (H)).    Allergies[1]   Zosyn  12/17 >> Fluc 12/17 >> Vanc 12/24 >>   12/16 MRS PCR - negative 12/21 BCx - BCID 3/3 MRSE  Pheng Prokop D. Lendell, PharmD, BCPS, BCCCP 01/24/2024, 1:25 PM     [1] No Known Allergies

## 2024-01-24 NOTE — Progress Notes (Signed)
 "  NAME:  Raymond Hurst, MRN:  [id number], DOB:  09-18-1950, LOS: 7 ADMISSION DATE:  01/16/2024, CONSULTATION DATE:  12/17 REFERRING MD:  Arlon CHIEF COMPLAINT:  GIB   History of Present Illness:  73 year old male with past medical history of atrial fibrillation on Eliquis , stroke, OSA on CPAP, COPD, history of GIB 2/2 gastric ulcer who presented to [facility] ED with hematemesis.   He presented 12/16 with new hematemesis after restarting Eliquis . Had been admitted 01/06/24-01/08/24 with bloody stool found to have gastric ulcer. He was cleared by GI to restart Eliquis . He had episode at home of dizziness, hematemesis. In ED had initial systolics in 80s and given IVF, 1U PRBC, Kcentra , PPI. GI was reconsulted. 12/17 had ongoing blood loss anemia without recurrent hematemesis and was given additional 2U  PRBC. EGD showed duodenal active bleed which was clipped. He was intubated during the procedure and admitted to ICU after for ongoing care. Recommended that he transfer to [facility] for IR consult.   Pertinent  Medical History  atrial fibrillation on Eliquis , stroke, OSA on CPAP, COPD, history of GIB 2/2 gastric ulcer, Left Upper Lobe squamous cell carcinoma s/p chemo  Significant Hospital Events: Including procedures, antibiotic start and stop dates in addition to other pertinent events   12/16: admit [facility] for hematemesis  12/17: EGD with duodenal bleed clipped, intubated > transfer [facility] for IR eval. Not an IR candidate. Underwent massive transfusion protocol. Taken to OR by gen surg for emergent surgery. S/p ex-lap duodenotomy and ligation of gastroduodenal artery.  12/19: S/p ex lap abdominal wall closure, some bleeding from liver capsule controlled with cautery and Surgicel     Interim History / Subjective:   S/p ex lap abdominal wall closure, some bleeding from liver capsule controlled with cautery and Surgicel on 12/19  This a.m. Sats well on nasal cannula Still n.p.o., NG to LIS on TPN NG output  200, JP output 200 mL bloody Adequate UOP, Sodium 155-Will go up on D5-150 ml/hour Pending upper GI study prior to enteral feed on 12/25 Blood culture 3/4 methicillin-resistant Staph epidermidis, clinically stable-Will initiate vancomycin    Objective   Blood pressure 118/79, pulse (!) 107, temperature 99 F (37.2 C), temperature source Bladder, resp. rate (!) 23, height 6' 0.01 (1.829 m), weight 135.7 kg, SpO2 97%.    FiO2 (%):  [36 %-40 %] 36 %   Intake/Output Summary (Last 24 hours) at 01/24/2024 0726 Last data filed at 01/24/2024 0600 Gross per 24 hour  Intake 3359.44 ml  Output 4950 ml  Net -1590.56 ml   Filed Weights   01/19/24 0600 01/21/24 0324 01/22/24 0232  Weight: (!) 141.3 kg (!) 139.4 kg 135.7 kg    Examination: General: Lying in bed, not in distress HEENT: NCAT Lungs: Symmetrical chest wall movement, clear to auscultation CVS: S1-S2 normal Abdomen: Soft nontender nondistended Extremities: Chronic lymphedema, 2+ edema Neuro: PERL follows commands, move all extremities GU: Deferred   Resolved Hospital Problem list    Assessment & Plan:   73 year old male with history of A-fib was on Eliquis , stroke, OSA on CPAP, COPD, presented with hematemesis/hemorrhagic shock due to duodenal ulcer hemorrhage.   Hemorrhagic shock 2/2 acute UGIB - duodenal s/p clip-resolved Recent history of gastric ulcer bleed  Thrombocytopenia - s/p massive transfusion protocol 12/17 and Kcentra , TXA and factor 7. - s/p laparotomy for duodenotomy and ligation of GDA, ABThera wound VAC placement 12/17 - S/p ex lap, abdominal wall closure,-some bleeding from liver capsule controlled with cautery and Surgicel  12/19 -high bloody JP output.  Hb stable, PL in the 50s-improving.  Minimal JP output -Minimal JP output 70 mL overnight (more bile tinged), improved from bloody 810 mL 12/20 - Minimal JP output 180 mL overnight-blood nonbilious - Serial H&H -Continue n.p.o., NG to LIS - Continue  TPN -Blood culture 12/21 grew methicillin resistant staph epididymis.  Clinically stable - Started vancomycin , and continue Zosyn  and fluconazole  - ID team is on board - Pending upper GI prior to enteral feed   Post-procedural vent management-extubated on 12/23, now on nasal cannula COPD  OSA on CPAP  - yupelri  and brovana  nebs -Continue chest physio   Blood culture 3/4 methicillin-resistant staph epididymis Fever-improving No leukocytosis,  Vancomycin  added to Zosyn  and fluconazole  ID team on board   AKI Lactic Acidosis-improved - trend serum Cr and UOP   Atrial fibrillation on Eliquis  History of stroke  - hold anticoagulation - currently rate controlled  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  See PMH above for treatment hx.  Nutrition - TPN    Labs   CBC: Recent Labs  Lab 01/17/24 2347 01/18/24 0230 01/19/24 0324 01/19/24 0910 01/20/24 0355 01/20/24 0802 01/20/24 1400 01/20/24 1426 01/21/24 0315 01/21/24 1715 01/22/24 0320 01/22/24 1716 01/23/24 0320 01/24/24 0445  WBC 10.0   < > 10.2  --  7.3  --   --   --  7.3  --  8.1  --  9.2  --   NEUTROABS 9.0*  --   --   --   --   --   --   --   --   --   --   --   --   --   HGB 17.7*   < > 17.2*   < > 15.9   < > 16.3   < > 16.5 17.8* 17.3*  17.6* 17.4* 17.5* 17.2*  HCT 50.4   < > 48.7   < > 47.1   < > 48.8   < > 49.6 54.7* 53.4*  53.6* 52.6* 53.7* 53.0*  MCV 84.0   < > 84.7  --  88.7  --   --   --  89.0  --  90.8  --  91.5  --   PLT 67*   < > 64*  --  54*  --  52*  --  51*  --  59*  --  67*  --    < > = values in this interval not displayed.    Basic Metabolic Panel: Recent Labs  Lab 01/19/24 0324 01/19/24 0910 01/20/24 0355 01/20/24 1426 01/21/24 0315 01/22/24 0320 01/23/24 0320 01/24/24 0445  NA 147*   < > 146* 146* 148* 152* 152* 155*  K 3.6   < > 3.8 3.4* 3.6 3.6 3.5 3.5  CL 112*  --  112*  --  114* 118* 120* 121*  CO2 22  --  25   --  23 24 22 23   GLUCOSE 121*  --  100*  --  99 142* 194* 151*  BUN 61*  --  65*  --  64* 62* 53* 47*  CREATININE 1.80*  --  1.90*  --  1.82* 1.75* 1.47* 1.43*  CALCIUM  8.9  --  8.4*  --  8.4* 8.7* 8.9 8.9  MG 1.7  --  2.0  --   --  2.3 2.2 2.0  PHOS 5.0*  --  4.4  --   --  3.1 2.7 2.7   < > = values in this interval not displayed.   GFR: Estimated Creatinine Clearance: 67.6 mL/min (A) (by C-G formula based on SCr of 1.43 mg/dL (H)). Recent Labs  Lab 01/17/24 2347 01/18/24 0425 01/19/24 0324 01/20/24 0355 01/21/24 0315 01/21/24 2141 01/22/24 0320 01/23/24 0320  PROCALCITON  --   --   --   --   --  2.93 3.32 2.45  WBC 10.0 11.1*   < > 7.3 7.3  --  8.1 9.2  LATICACIDVEN 5.9* 4.1*  --   --   --   --   --   --    < > = values in this interval not displayed.    Liver Function Tests: Recent Labs  Lab 01/22/24 0320  AST 14*  ALT 8  ALKPHOS 44  BILITOT 1.2  PROT 5.2*  ALBUMIN 2.7*   No results for input(s): LIPASE, AMYLASE in the last 168 hours. No results for input(s): AMMONIA in the last 168 hours.  ABG    Component Value Date/Time   PHART 7.370 01/20/2024 1426   PCO2ART 43.3 01/20/2024 1426   PO2ART 139 (H) 01/20/2024 1426   HCO3 24.9 01/20/2024 1426   TCO2 26 01/20/2024 1426   ACIDBASEDEF 1.0 01/18/2024 0439   O2SAT 99 01/20/2024 1426     Coagulation Profile: Recent Labs  Lab 01/18/24 0701 01/18/24 0702 01/20/24 1400  INR 1.1 1.0 1.2    Cardiac Enzymes: No results for input(s): CKTOTAL, CKMB, CKMBINDEX, TROPONINI in the last 168 hours.  HbA1C: Hgb A1c MFr Bld  Date/Time Value Ref Range Status  01/17/2024 11:47 PM 5.5 4.8 - 5.6 % Final    Comment:    (NOTE) Diagnosis of Diabetes The following HbA1c ranges recommended by the American Diabetes Association (ADA) may be used as an aid in the diagnosis of diabetes mellitus.  Hemoglobin             Suggested A1C NGSP%              Diagnosis  <5.7                   Non  Diabetic  5.7-6.4                Pre-Diabetic  >6.4                   Diabetic  <7.0                   Glycemic control for                       adults with diabetes.    06/08/2018 12:25 AM 5.2 4.8 - 5.6 % Final    Comment:    (NOTE) Pre diabetes:          5.7%-6.4% Diabetes:              >6.4% Glycemic control for   <7.0% adults with diabetes     CBG: Recent Labs  Lab 01/23/24 1547 01/23/24 1951 01/23/24 2327 01/24/24 0329 01/24/24 0713  GLUCAP 128* 101* 116* 139* 141*       Critical care time: 35 mins     CRITICAL CARE  Lenny Drought, MD  [facility] Prefer epic messenger for cross cover needs   Critical care time was exclusive of separately billable procedures and treating other patients.  Critical care was necessary to treat or prevent  imminent or life-threatening deterioration.  Critical care was time spent personally by me on the following activities: development of treatment plan with patient and/or surrogate as well as nursing, discussions with consultants, evaluation of patient's response to treatment, examination of patient, obtaining history from patient or surrogate, ordering and performing treatments and interventions, ordering and review of laboratory studies, ordering and review of radiographic studies, pulse oximetry, re-evaluation of patient's condition and participation in multidisciplinary rounds.   "

## 2024-01-25 DIAGNOSIS — K922 Gastrointestinal hemorrhage, unspecified: Secondary | ICD-10-CM | POA: Diagnosis not present

## 2024-01-25 DIAGNOSIS — J449 Chronic obstructive pulmonary disease, unspecified: Secondary | ICD-10-CM | POA: Diagnosis not present

## 2024-01-25 DIAGNOSIS — D696 Thrombocytopenia, unspecified: Secondary | ICD-10-CM | POA: Diagnosis not present

## 2024-01-25 DIAGNOSIS — G4733 Obstructive sleep apnea (adult) (pediatric): Secondary | ICD-10-CM | POA: Diagnosis not present

## 2024-01-25 LAB — COMPREHENSIVE METABOLIC PANEL WITH GFR
ALT: 12 U/L (ref 0–44)
AST: 21 U/L (ref 15–41)
Albumin: 2.6 g/dL — ABNORMAL LOW (ref 3.5–5.0)
Alkaline Phosphatase: 57 U/L (ref 38–126)
Anion gap: 9 (ref 5–15)
BUN: 42 mg/dL — ABNORMAL HIGH (ref 8–23)
CO2: 25 mmol/L (ref 22–32)
Calcium: 9 mg/dL (ref 8.9–10.3)
Chloride: 119 mmol/L — ABNORMAL HIGH (ref 98–111)
Creatinine, Ser: 1.43 mg/dL — ABNORMAL HIGH (ref 0.61–1.24)
GFR, Estimated: 52 mL/min — ABNORMAL LOW
Glucose, Bld: 140 mg/dL — ABNORMAL HIGH (ref 70–99)
Potassium: 3.4 mmol/L — ABNORMAL LOW (ref 3.5–5.1)
Sodium: 153 mmol/L — ABNORMAL HIGH (ref 135–145)
Total Bilirubin: 0.9 mg/dL (ref 0.0–1.2)
Total Protein: 5.4 g/dL — ABNORMAL LOW (ref 6.5–8.1)

## 2024-01-25 LAB — CULTURE, BLOOD (ROUTINE X 2): Special Requests: ADEQUATE

## 2024-01-25 LAB — HEMOGLOBIN AND HEMATOCRIT, BLOOD
HCT: 50.4 % (ref 39.0–52.0)
Hemoglobin: 16.3 g/dL (ref 13.0–17.0)

## 2024-01-25 LAB — GLUCOSE, CAPILLARY
Glucose-Capillary: 108 mg/dL — ABNORMAL HIGH (ref 70–99)
Glucose-Capillary: 115 mg/dL — ABNORMAL HIGH (ref 70–99)
Glucose-Capillary: 118 mg/dL — ABNORMAL HIGH (ref 70–99)
Glucose-Capillary: 132 mg/dL — ABNORMAL HIGH (ref 70–99)
Glucose-Capillary: 137 mg/dL — ABNORMAL HIGH (ref 70–99)
Glucose-Capillary: 144 mg/dL — ABNORMAL HIGH (ref 70–99)

## 2024-01-25 LAB — PHOSPHORUS: Phosphorus: 2.9 mg/dL (ref 2.5–4.6)

## 2024-01-25 LAB — MAGNESIUM: Magnesium: 2.1 mg/dL (ref 1.7–2.4)

## 2024-01-25 MED ORDER — DEXTROSE 5 % IV SOLN: INTRAVENOUS | Status: DC

## 2024-01-25 MED ORDER — SODIUM CHLORIDE 0.9% FLUSH
10.0000 mL | INTRAVENOUS | Status: DC | PRN
  Administered 2024-01-30 – 2024-02-11 (×2): 10 mL

## 2024-01-25 MED ORDER — TRAVASOL 10 % IV SOLN
INTRAVENOUS | Status: AC
  Filled 2024-01-25: qty 1203.6

## 2024-01-25 MED ORDER — POTASSIUM CHLORIDE 10 MEQ/100ML IV SOLN
10.0000 meq | INTRAVENOUS | Status: AC
  Administered 2024-01-25 (×6): 10 meq via INTRAVENOUS
  Filled 2024-01-25 (×6): qty 100

## 2024-01-25 MED ORDER — ACETAMINOPHEN 10 MG/ML IV SOLN
1000.0000 mg | Freq: Once | INTRAVENOUS | Status: AC
  Administered 2024-01-25: 1000 mg via INTRAVENOUS
  Filled 2024-01-25: qty 100

## 2024-01-25 NOTE — Progress Notes (Addendum)
 "  Progress Note  6 Days Post-Op  Subjective: No n/v.  NGT working.    ROS  All negative with the exception of above.  Objective: Vital signs in last 24 hours: Temp:  [98.2 F (36.8 C)-100.4 F (38 C)] 98.6 F (37 C) (12/25 0700) Pulse Rate:  [90-112] 104 (12/25 0700) Resp:  [17-30] 29 (12/25 0700) BP: (103-154)/(58-118) 151/97 (12/25 0700) SpO2:  [96 %-100 %] 96 % (12/25 0700) Arterial Line BP: (145-162)/(57-71) 155/69 (12/24 1400) Weight:  [127.2 kg] 127.2 kg (12/25 0452) Last BM Date : 01/24/24  Intake/Output from previous day: 12/24 0701 - 12/25 0700 In: 5300.4 [I.V.:4125.7; IV Piggyback:1174.7] Out: 6731 [Urine:6500; Emesis/NG output:200; Drains:30; Stool:1] Intake/Output this shift: Total I/O In: -  Out: 425 [Urine:425]  PE: Gen:  NAD. HEENT: NGT in place. Still thick bilious output.   Card: RR&R Pulm:  breathing comfortably Abd: Soft, midline incision with dressing; Right JP drain recorded at 200 for 24 hours.  Non bilious.   Skin: Warm and dry, edematous, chronic lower extremity skin changes   Lab Results:  Recent Labs    01/23/24 0320 01/24/24 0445 01/24/24 1700 01/25/24 0435  WBC 9.2  --   --   --   HGB 17.5*   < > 16.2 16.3  HCT 53.7*   < > 50.8 50.4  PLT 67*  --   --   --    < > = values in this interval not displayed.   BMET Recent Labs    01/24/24 0445 01/25/24 0435  NA 155* 153*  K 3.5 3.4*  CL 121* 119*  CO2 23 25  GLUCOSE 151* 140*  BUN 47* 42*  CREATININE 1.43* 1.43*  CALCIUM  8.9 9.0   PT/INR No results for input(s): LABPROT, INR in the last 72 hours.  CMP     Component Value Date/Time   NA 153 (H) 01/25/2024 0435   NA 137 06/01/2023 1439   K 3.4 (L) 01/25/2024 0435   CL 119 (H) 01/25/2024 0435   CO2 25 01/25/2024 0435   GLUCOSE 140 (H) 01/25/2024 0435   BUN 42 (H) 01/25/2024 0435   BUN 9 06/01/2023 1439   CREATININE 1.43 (H) 01/25/2024 0435   CREATININE 0.91 01/12/2024 1048   CALCIUM  9.0 01/25/2024 0435    PROT 5.4 (L) 01/25/2024 0435   PROT 6.2 06/01/2023 1439   ALBUMIN 2.6 (L) 01/25/2024 0435   ALBUMIN 3.0 (L) 06/01/2023 1439   AST 21 01/25/2024 0435   AST 14 (L) 01/12/2024 1048   ALT 12 01/25/2024 0435   ALT 5 01/12/2024 1048   ALKPHOS 57 01/25/2024 0435   BILITOT 0.9 01/25/2024 0435   BILITOT 0.5 01/12/2024 1048   GFRNONAA 52 (L) 01/25/2024 0435   GFRNONAA >60 01/12/2024 1048   GFRAA >60 06/13/2019 1350   Lipase     Component Value Date/Time   LIPASE 28 09/07/2023 2047       Studies/Results: DG Abd Portable 1V Result Date: 01/23/2024 CLINICAL DATA:  Feeding tube placement. EXAM: PORTABLE ABDOMEN - 1 VIEW COMPARISON:  01/19/2024 FINDINGS: Tip and side port of the enteric tube below the diaphragm in the stomach. Again seen coiling in the upper abdomen. Catheter/drain in the right mid abdomen is again seen. No bowel dilatation in the included upper abdomen. Retrocardiac opacity peer IMPRESSION: Tip and side port of the enteric tube below the diaphragm in the stomach. Electronically Signed   By: Andrea Gasman M.D.   On: 01/23/2024 15:38   DG  CHEST PORT 1 VIEW Result Date: 01/23/2024 EXAM: 1 VIEW(S) XRAY OF THE CHEST 01/23/2024 09:41:00 AM COMPARISON: 01/20/2024 and Chest CT 08/26/2023. CLINICAL HISTORY: 73y 27m M. Respiratory failure (Hepatocellular Carcinoma). History of treated left lung cancer. FINDINGS: LINES, TUBES AND DEVICES: ETT stable in position. Right IJ CVC with tip at superior cavoatrial junction. Enteric tube extends below diaphragm with tip beyond inferior film margin. LUNGS AND PLEURA: Multifocal confluent left upper and lower lung opacity, not significantly changed from recent radiographs, progressed from restaging chest CT in 08/26/2023 but nonspecific. Moderate left pleural effusion. Small right pleural effusion. Right basilar airspace opacities. Right lung otherwise remains well aerated. No pneumothorax. HEART AND MEDIASTINUM: No acute abnormality of the cardiac  and mediastinal silhouettes. BONES AND SOFT TISSUES: No acute osseous abnormality. IMPRESSION: 1. Stable lines and tubes. 2. Multifocal confluent Left lung opacity, stable recently and progressed since lung cancer restaging CT in July. 3. Stable small right pleural effusion. 4. No no new cardiopulmonary abnormality. Electronically signed by: Helayne Hurst MD 01/23/2024 10:56 AM EST RP Workstation: [id number]    Anti-infectives: Anti-infectives (From admission, onward)    Start     Dose/Rate Route Frequency Ordered Stop   01/25/24 1500  vancomycin  (VANCOREADY) IVPB 1500 mg/300 mL        1,500 mg 150 mL/hr over 120 Minutes Intravenous Every 24 hours 01/24/24 1322     01/24/24 1115  vancomycin  (VANCOCIN ) 2,500 mg in sodium chloride  0.9 % 500 mL IVPB        2,500 mg 262.5 mL/hr over 120 Minutes Intravenous  Once 01/24/24 1029 01/24/24 1530   01/17/24 2245  piperacillin -tazobactam (ZOSYN ) IVPB 3.375 g        3.375 g 12.5 mL/hr over 240 Minutes Intravenous Every 8 hours 01/17/24 2149     01/17/24 2200  fluconazole  (DIFLUCAN ) IVPB 400 mg        400 mg 100 mL/hr over 120 Minutes Intravenous Every 24 hours 01/17/24 2059          Assessment/Plan Duodenal ulcer hemorrhage  [facility] EGD and IR coil embolization of gastric ulcer in August 2025 Presented to [facility] 12/16 w/ hematemesis, EGD 12/17 showed bleed from duodenal ulcer, clips placed Transferred to [facility] for critical care services and IR consult Arrived in hemorrhagic shock, critical, MTP started, taken emergently to the OR for below:    POD 8 S/P LAPAROTOMY, EXPLORATORY, Duodenotom, Ligation of GDA and branches, ABThera wound VAC placement 12/17 Dr. Rubin  POD 6 S/P ex lap, abdominal wall closure 12/19 Dr. Ebbie; some bleeding from liver capsule controlled with cautery and surgicel snow.  - no overt fevers.   - WBC back to normal.  - JP drain high output, but non bilious.  Will keep and monitor for now. Output down.  - will clamp NGT  and hopefully pull this afternoon. - plan UGI tomorrow.   - Continue wet-to-dry midline - Continue IV abx  - Ongoing vent weaning trials per CCM. - continue TPN.  - was tentatively planning upper GI today, but given n/v and appearance of NGT output, will wait another day.       Per CCM: Hemorrhagic shock  Metastatic lung CA COPD OSA AKI  PMH CVA PMH afib on eliquis  - DOAC held in the setting of acute bleeding   Discussed with RN   LOS: 8 days   I reviewed critical care notes, specialist notes, nursing notes, last 24 h vitals and pain scores, last 48 h intake and output, last  24 h labs and trends, and last 24 h imaging results.  Jina LITTIE Nephew, MD, FACS, FSSO Surgical Oncology, General Surgery, Trauma and Critical [facility], [place] [phone] for weekday/non holidays Check [email or web address] for coverage night/weekend/holidays under General Surgery     "

## 2024-01-25 NOTE — Progress Notes (Signed)
 Electrolytes NOT replaced d/t TPN.

## 2024-01-25 NOTE — Progress Notes (Addendum)
 "  NAME:  Raymond Hurst, MRN:  [id number], DOB:  Oct 07, 1950, LOS: 8 ADMISSION DATE:  01/16/2024, CONSULTATION DATE:  12/17 REFERRING MD:  Arlon CHIEF COMPLAINT:  GIB   History of Present Illness:  73 year old male with past medical history of atrial fibrillation on Eliquis , stroke, OSA on CPAP, COPD, history of GIB 2/2 gastric ulcer who presented to [facility] ED with hematemesis.   He presented 12/16 with new hematemesis after restarting Eliquis . Had been admitted 01/06/24-01/08/24 with bloody stool found to have gastric ulcer. He was cleared by GI to restart Eliquis . He had episode at home of dizziness, hematemesis. In ED had initial systolics in 80s and given IVF, 1U PRBC, Kcentra , PPI. GI was reconsulted. 12/17 had ongoing blood loss anemia without recurrent hematemesis and was given additional 2U  PRBC. EGD showed duodenal active bleed which was clipped. He was intubated during the procedure and admitted to ICU after for ongoing care. Recommended that he transfer to [facility] for IR consult.   Pertinent  Medical History  atrial fibrillation on Eliquis , stroke, OSA on CPAP, COPD, history of GIB 2/2 gastric ulcer, Left Upper Lobe squamous cell carcinoma s/p chemo  Significant Hospital Events: Including procedures, antibiotic start and stop dates in addition to other pertinent events   12/16: admit [facility] for hematemesis  12/17: EGD with duodenal bleed clipped, intubated > transfer [facility] for IR eval. Not an IR candidate. Underwent massive transfusion protocol. Taken to OR by gen surg for emergent surgery. S/p ex-lap duodenotomy and ligation of gastroduodenal artery.  12/19: S/p ex lap abdominal wall closure, some bleeding from liver capsule controlled with cautery and Surgicel     Interim History / Subjective:   S/p ex lap abdominal wall closure, some bleeding from liver capsule controlled with cautery and Surgicel on 12/19  This a.m. Sats well on nasal cannula, n.p.o., NG clamped, plan for upper GI  tomorrow On TPN Adequate UOP Sodium 153-went up to D5-200 mL/hour Blood culture 3/4 methicillin-resistant Staph epidermidis, clinically stable-Will initiate vancomycin    Objective   Blood pressure (!) 151/97, pulse (!) 104, temperature 98.6 F (37 C), resp. rate (!) 29, height 6' 0.01 (1.829 m), weight 127.2 kg, SpO2 96%.        Intake/Output Summary (Last 24 hours) at 01/25/2024 9177 Last data filed at 01/25/2024 0741 Gross per 24 hour  Intake 5300.39 ml  Output 7156 ml  Net -1855.61 ml   Filed Weights   01/21/24 0324 01/22/24 0232 01/25/24 0452  Weight: (!) 139.4 kg 135.7 kg 127.2 kg    Examination: General: Lying in bed, not in distress HEENT: NCAT Lungs: Symmetrical chest wall movement, clear to auscultation Abdomen: Soft nontender nondistended Extremities: Warm, mild edema Neuro: AOX3, conversational, moves all extremities GU: Deferred   Resolved Hospital Problem list    Assessment & Plan:   73 year old male with history of A-fib was on Eliquis , stroke, OSA on CPAP, COPD, presented with hematemesis/hemorrhagic shock due to duodenal ulcer hemorrhage.   Hemorrhagic shock 2/2 acute UGIB - duodenal s/p clip-resolved Recent history of gastric ulcer bleed  Thrombocytopenia - s/p massive transfusion protocol 12/17 and Kcentra , TXA and factor 7. - s/p laparotomy for duodenotomy and ligation of GDA, ABThera wound VAC placement 12/17 - S/p ex lap, abdominal wall closure,-some bleeding from liver capsule controlled with cautery and Surgicel 12/19 -high bloody JP output.  Hb stable, PL in the 50s-improving.  Minimal JP output -Minimal JP output 70 mL overnight (more bile tinged), improved from bloody 810 mL  12/20 - Minimal JP output 180 mL overnight-blood nonbilious - Serial H&H -Continue n.p.o., NG to LIS - Continue TPN -Blood culture 12/21 grew methicillin resistant staph epididymis.  Clinically stable - Started vancomycin , and continue Zosyn  and fluconazole  - ID  team is on board 12/25: NG clamp trial, will be later DC'd by surgery Plan for upper GI tomorrow   Post-procedural vent management-extubated on 12/23, now on nasal cannula COPD  OSA on CPAP  - yupelri  and brovana  nebs -Continue chest physio   Blood culture 3/4 methicillin-resistant staph epididymis Fever-improving No leukocytosis,  Vancomycin  added to Zosyn  and fluconazole  ID team on board   Hypernatremia Sodium 153-went up to D5-200 mL/hour  AKI Lactic Acidosis-improved - trend serum Cr and UOP   Atrial fibrillation on Eliquis  History of stroke  - hold anticoagulation - currently rate controlled  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  See PMH above for treatment hx.  Nutrition - TPN   Updated patient's wife, and sister at the bedside.   Dispo: Transfer out of the ICU   Labs   CBC: Recent Labs  Lab 01/19/24 0324 01/19/24 0910 01/20/24 0355 01/20/24 0802 01/20/24 1400 01/20/24 1426 01/21/24 0315 01/21/24 1715 01/22/24 0320 01/22/24 1716 01/23/24 0320 01/24/24 0445 01/24/24 1700 01/25/24 0435  WBC 10.2  --  7.3  --   --   --  7.3  --  8.1  --  9.2  --   --   --   HGB 17.2*   < > 15.9   < > 16.3   < > 16.5   < > 17.3*  17.6* 17.4* 17.5* 17.2* 16.2 16.3  HCT 48.7   < > 47.1   < > 48.8   < > 49.6   < > 53.4*  53.6* 52.6* 53.7* 53.0* 50.8 50.4  MCV 84.7  --  88.7  --   --   --  89.0  --  90.8  --  91.5  --   --   --   PLT 64*  --  54*  --  52*  --  51*  --  59*  --  67*  --   --   --    < > = values in this interval not displayed.    Basic Metabolic Panel: Recent Labs  Lab 01/20/24 0355 01/20/24 1426 01/21/24 0315 01/22/24 0320 01/23/24 0320 01/24/24 0445 01/25/24 0435  NA 146*   < > 148* 152* 152* 155* 153*  K 3.8   < > 3.6 3.6 3.5 3.5 3.4*  CL 112*  --  114* 118* 120* 121* 119*  CO2 25  --  23 24 22 23 25   GLUCOSE 100*  --  99 142* 194* 151* 140*  BUN 65*  --  64* 62* 53* 47*  42*  CREATININE 1.90*  --  1.82* 1.75* 1.47* 1.43* 1.43*  CALCIUM  8.4*  --  8.4* 8.7* 8.9 8.9 9.0  MG 2.0  --   --  2.3 2.2 2.0 2.1  PHOS 4.4  --   --  3.1 2.7 2.7 2.9   < > = values in this interval not displayed.   GFR: Estimated Creatinine Clearance: 65.3 mL/min (A) (by C-G formula based on SCr of 1.43 mg/dL (H)). Recent Labs  Lab 01/20/24 0355 01/21/24 0315 01/21/24 2141 01/22/24 0320 01/23/24 0320  PROCALCITON  --   --  2.93 3.32 2.45  WBC 7.3 7.3  --  8.1 9.2    Liver Function Tests: Recent Labs  Lab 01/22/24 0320 01/25/24 0435  AST 14* 21  ALT 8 12  ALKPHOS 44 57  BILITOT 1.2 0.9  PROT 5.2* 5.4*  ALBUMIN 2.7* 2.6*   No results for input(s): LIPASE, AMYLASE in the last 168 hours. No results for input(s): AMMONIA in the last 168 hours.  ABG    Component Value Date/Time   PHART 7.370 01/20/2024 1426   PCO2ART 43.3 01/20/2024 1426   PO2ART 139 (H) 01/20/2024 1426   HCO3 24.9 01/20/2024 1426   TCO2 26 01/20/2024 1426   ACIDBASEDEF 1.0 01/18/2024 0439   O2SAT 99 01/20/2024 1426     Coagulation Profile: Recent Labs  Lab 01/20/24 1400  INR 1.2    Cardiac Enzymes: No results for input(s): CKTOTAL, CKMB, CKMBINDEX, TROPONINI in the last 168 hours.  HbA1C: Hgb A1c MFr Bld  Date/Time Value Ref Range Status  01/17/2024 11:47 PM 5.5 4.8 - 5.6 % Final    Comment:    (NOTE) Diagnosis of Diabetes The following HbA1c ranges recommended by the American Diabetes Association (ADA) may be used as an aid in the diagnosis of diabetes mellitus.  Hemoglobin             Suggested A1C NGSP%              Diagnosis  <5.7                   Non Diabetic  5.7-6.4                Pre-Diabetic  >6.4                   Diabetic  <7.0                   Glycemic control for                       adults with diabetes.    06/08/2018 12:25 AM 5.2 4.8 - 5.6 % Final    Comment:    (NOTE) Pre diabetes:          5.7%-6.4% Diabetes:               >6.4% Glycemic control for   <7.0% adults with diabetes     CBG: Recent Labs  Lab 01/24/24 1603 01/24/24 1937 01/24/24 2325 01/25/24 0319 01/25/24 0720  GLUCAP 120* 133* 111* 132* 144*       Critical care time: 30 mins     CRITICAL CARE  Lenny Drought, MD  [facility] Prefer epic messenger for cross cover needs   Critical care time was exclusive of separately billable procedures and treating other patients.  Critical care was necessary to treat or prevent imminent or life-threatening deterioration.  Critical care was time spent personally by me on the following activities: development of treatment plan with patient and/or surrogate as well as nursing, discussions with consultants, evaluation of patient's response to treatment, examination of patient, obtaining history from patient or surrogate, ordering and performing treatments and interventions, ordering and review of laboratory studies, ordering and review of radiographic studies, pulse oximetry, re-evaluation of patient's condition and participation in multidisciplinary rounds.   "

## 2024-01-25 NOTE — Progress Notes (Signed)
 NG tube clamped per order from surgery for clamping trial.

## 2024-01-25 NOTE — Progress Notes (Signed)
 PHARMACY - TOTAL PARENTERAL NUTRITION CONSULT NOTE   Indication: prolonged NPO  Patient Measurements: Height: 6' 0.01 (182.9 cm) Weight: 127.2 kg (280 lb 6.8 oz) IBW/kg (Calculated) : 77.62 TPN AdjBW (KG): 88.1 Body mass index is 38.02 kg/m. Usual Weight: around 117-119 kg > current weight of ~135 kg likely not accurate and elevated 2/2 acute illness, IVF and edema  Assessment:  36 YOM with medical history significant for atrial fibrillation, hx stroke, COPD, lung cancer with mets, and multiple GIBs who is admitted with hematemesis after resuming apixaban  following recent admit 12/6-12/8 for melena. EGD on 12/8 showed non-bleeding duodenal ulcer with a clean ulcer base. He was transferred to [facility] for IR eval but not a candidate. He was taken to OR emergently for ex-lap duodenotomy and ligation of gastroduodenal artery. He is s/p MTP for duodenal hemorrhage and has been NPO since surgery.  Pharmacy consulted to manage TPN.  Glucose / Insulin : A1c 5.5, CBGs 112-144 on TPN, 6 units SSI/ 24 hrs (also on D5% @ 150)  Electrolytes: Na 153 (none in TPN), K 3.4 (required 40 mEq IV yesterday), Cl 119 (max acetate in TPN), CO2 25, CoCa 10.1, Phos 2.9, Mg 2.1 Renal: AKI, SCr 1.43 improving (baseline 0.9), BUN 42 Hepatic: LFTs / Tbili WNL, albumin 2.6, TG 93 (12/22)  Intake / Output; MIVF: UOP 2.1 mL/kg/hr, NGT 200 mL, JP drain 30, Net IO Since Admission: -11,950.67 mL [01/25/24 0728]  GI Imaging: None since start of TPN GI Surgeries / Procedures:  12/17 EGD: red blood entire stomach, duodenal bulb, duodenum s/p hemostatic clip, hemospray  12/17 ex-lap with ligation of GDA and branches, VAC placement 12/19 abd wall closure  Central access: CVC 01/17/24 TPN start date: 01/21/24  Nutritional Goals: Goal TPN rate is 85 mL/hr (provides ~120 g of protein and ~2100 kcals per day)  RD Assessment: Estimated Needs Total Energy Estimated Needs: 2100-2300 Total Protein Estimated Needs: 115-130g Total  Fluid Estimated Needs: >/=2L  Current Nutrition:  12/21 NPO and TPN D5W @ 150 = 180 g dextrose  or 612 Kcal   Plan: 60 mEq IV KCL outside of TPN  Continue TPN at 85 mL/hr at 1800, meeting 100% of nutritional needs  Electrolytes in TPN: Na 0 mEq/L, increase K 60 mEq/L, Ca 5 mEq/L, Mg 7 mEq/L, and Phos 15 mmol/L. Max acetate Add standard MVI and trace elements to TPN thiamine  100 mg IV in TPN x 5 days Continue Moderate q4h SSI and adjust as needed  Monitor TPN labs on Mon/Thurs F/u potential upper GI per surgery   Thank you for allowing pharmacy to be a part of this patients care.  Raymond Hurst, PharmD, BCPS, BCCCP Clinical Pharmacist

## 2024-01-26 ENCOUNTER — Inpatient Hospital Stay (HOSPITAL_COMMUNITY)

## 2024-01-26 DIAGNOSIS — K922 Gastrointestinal hemorrhage, unspecified: Secondary | ICD-10-CM | POA: Diagnosis not present

## 2024-01-26 LAB — HEMOGLOBIN AND HEMATOCRIT, BLOOD
HCT: 49.9 % (ref 39.0–52.0)
HCT: 49.4 % (ref 39.0–52.0)
Hemoglobin: 15.9 g/dL (ref 13.0–17.0)
Hemoglobin: 15.7 g/dL (ref 13.0–17.0)

## 2024-01-26 LAB — BASIC METABOLIC PANEL WITH GFR
Anion gap: 9 (ref 5–15)
BUN: 38 mg/dL — ABNORMAL HIGH (ref 8–23)
CO2: 26 mmol/L (ref 22–32)
Calcium: 8.9 mg/dL (ref 8.9–10.3)
Chloride: 114 mmol/L — ABNORMAL HIGH (ref 98–111)
Creatinine, Ser: 1.43 mg/dL — ABNORMAL HIGH (ref 0.61–1.24)
GFR, Estimated: 52 mL/min — ABNORMAL LOW
Glucose, Bld: 121 mg/dL — ABNORMAL HIGH (ref 70–99)
Potassium: 3.4 mmol/L — ABNORMAL LOW (ref 3.5–5.1)
Sodium: 148 mmol/L — ABNORMAL HIGH (ref 135–145)

## 2024-01-26 LAB — GLUCOSE, CAPILLARY
Glucose-Capillary: 101 mg/dL — ABNORMAL HIGH (ref 70–99)
Glucose-Capillary: 115 mg/dL — ABNORMAL HIGH (ref 70–99)
Glucose-Capillary: 118 mg/dL — ABNORMAL HIGH (ref 70–99)
Glucose-Capillary: 125 mg/dL — ABNORMAL HIGH (ref 70–99)
Glucose-Capillary: 153 mg/dL — ABNORMAL HIGH (ref 70–99)

## 2024-01-26 MED ORDER — POTASSIUM CHLORIDE 10 MEQ/100ML IV SOLN
10.0000 meq | INTRAVENOUS | Status: AC
  Administered 2024-01-26 (×6): 10 meq via INTRAVENOUS
  Filled 2024-01-26 (×6): qty 100

## 2024-01-26 MED ORDER — INSULIN ASPART 100 UNIT/ML IJ SOLN
0.0000 [IU] | Freq: Four times a day (QID) | INTRAMUSCULAR | Status: DC
  Administered 2024-01-27: 1 [IU] via SUBCUTANEOUS
  Filled 2024-01-26: qty 1

## 2024-01-26 MED ORDER — FREE WATER: 200.0000 mL | Status: DC

## 2024-01-26 MED ORDER — DEXTROSE 5 % IV SOLN: INTRAVENOUS | Status: AC

## 2024-01-26 MED ORDER — SODIUM CHLORIDE (PF) 0.9 % IJ SOLN
INTRAMUSCULAR | Status: AC
  Administered 2024-01-26: 10 mL
  Filled 2024-01-26: qty 10

## 2024-01-26 MED ORDER — IOHEXOL 300 MG/ML  SOLN
100.0000 mL | Freq: Once | INTRAMUSCULAR | Status: AC | PRN
  Administered 2024-01-26: 200 mL via ORAL

## 2024-01-26 MED ORDER — TRAVASOL 10 % IV SOLN
INTRAVENOUS | Status: AC
  Filled 2024-01-26: qty 1350

## 2024-01-26 NOTE — TOC Progression Note (Addendum)
 Transition of Care [facility]) - Progression Note    Patient Details  Name: MAKYI LEDO MRN: [id number] Date of Birth: 1950-04-22  Transition of Care [facility] CM/SW Contact  Lendia Dais, [place] Phone Number: 01/26/2024, 12:44 PM  Clinical Narrative: CSW spoke to pt and pt sister at bedside. CSW introduced self and role.  CSW informed the pt of PT recs of STR at SNF. Pt was agreeable to SNF and requested that referrals be sent out to facilities in the [place] area. CSW informed pt of insurance auth once a facility is chosen and pt stated understanding.  [phone] - CSW spoke to the pt's wife [name] via phone and explained information above. [name] stated understanding and stated that she had a preference of [facility]. CSW later found out that is an ALF.  [phone] - Referrals sent in the HUB bed offers pending  CSW is pending OT eval to complete the FL2.      Barriers to Discharge: Continued Medical Work up               Expected Discharge Plan and Services                                               Social Drivers of Health (SDOH) Interventions SDOH Screenings   Food Insecurity: No Food Insecurity (01/16/2024)  Housing: Unknown (01/16/2024)  Transportation Needs: No Transportation Needs (01/16/2024)  Utilities: Not At Risk (01/16/2024)  Alcohol Screen: Low Risk (06/15/2023)  Depression (PHQ2-9): Low Risk (01/12/2024)  Financial Resource Strain: High Risk (06/15/2023)  Physical Activity: Inactive (06/15/2023)  Social Connections: Moderately Isolated (01/16/2024)  Stress: No Stress Concern Present (06/15/2023)  Tobacco Use: Medium Risk (01/19/2024)    Readmission Risk Interventions    09/08/2023   12:09 PM  Readmission Risk Prevention Plan  Transportation Screening Complete  HRI or Home Care Consult Complete  Social Work Consult for Recovery Care Planning/Counseling Complete  Palliative Care Screening Not Applicable  Medication Review Oceanographer)  Complete

## 2024-01-26 NOTE — Telephone Encounter (Signed)
 Patient called regarding her husband's ongoing acute illness.  She is concerned about a report of wire sticking out of of his intestine'.  I reviewed the chart. In all likelihood the wire of concern or the IR coils protruding at the ulcer base had endoscopy and seen again by Dr. Ebbie when he performed laparotomy.  No other foreign bodies found.  I called patient's cell phone number [phone].  I got [name].  I left a message explaining the foreign body seen at surgery and at endoscopy were in all likelihood residual of the coils placed by IR in an attempt to occlude the blood vessel and stop the bleeding.  I invited her to call me back on my cell phone [phone] anytime to discuss further.  Review of the records does reveal the patient has had a stormy course; he has essentially failed endoscopic control of bleeding x 2 and apparently now has failed IR and required laparotomy in [place].  His comorbidities including lung disease/lung cancer EtOH, ongoing anticoagulation requiring Eliquis  has added to the complexity of the clinical picture.

## 2024-01-26 NOTE — Progress Notes (Signed)
 "  Progress Note  7 Days Post-Op  Subjective: Ngt out after clamping trial yesterday.  No n/v.    Objective: Vital signs in last 24 hours: Temp:  [98 F (36.7 C)-99.9 F (37.7 C)] 98 F (36.7 C) (12/26 0808) Pulse Rate:  [86-98] 87 (12/26 0843) Resp:  [18-30] 18 (12/26 0843) BP: (121-163)/(81-98) 142/90 (12/26 0808) SpO2:  [93 %-100 %] 97 % (12/26 0843) Last BM Date : 01/26/24  Intake/Output from previous day: 12/25 0701 - 12/26 0700 In: 2128 [I.V.:1415.6; IV Piggyback:712.4] Out: 7315 [Urine:7300; Drains:15] Intake/Output this shift: Total I/O In: 0  Out: 4901 [Urine:4900; Stool:1]  PE: Gen:  NAD. Pulm:  breathing comfortably Abd: Soft, midline incision with dressing; Right JP drain recorded at 15 for 24 hours.  Non bilious.   Skin: Warm and dry, edematous, chronic lower extremity skin changes   Lab Results:  Recent Labs    01/25/24 0435 01/26/24 1015  HGB 16.3 15.9  HCT 50.4 49.9   BMET Recent Labs    01/24/24 0445 01/25/24 0435  NA 155* 153*  K 3.5 3.4*  CL 121* 119*  CO2 23 25  GLUCOSE 151* 140*  BUN 47* 42*  CREATININE 1.43* 1.43*  CALCIUM  8.9 9.0   PT/INR No results for input(s): LABPROT, INR in the last 72 hours.  CMP     Component Value Date/Time   NA 153 (H) 01/25/2024 0435   NA 137 06/01/2023 1439   K 3.4 (L) 01/25/2024 0435   CL 119 (H) 01/25/2024 0435   CO2 25 01/25/2024 0435   GLUCOSE 140 (H) 01/25/2024 0435   BUN 42 (H) 01/25/2024 0435   BUN 9 06/01/2023 1439   CREATININE 1.43 (H) 01/25/2024 0435   CREATININE 0.91 01/12/2024 1048   CALCIUM  9.0 01/25/2024 0435   PROT 5.4 (L) 01/25/2024 0435   PROT 6.2 06/01/2023 1439   ALBUMIN 2.6 (L) 01/25/2024 0435   ALBUMIN 3.0 (L) 06/01/2023 1439   AST 21 01/25/2024 0435   AST 14 (L) 01/12/2024 1048   ALT 12 01/25/2024 0435   ALT 5 01/12/2024 1048   ALKPHOS 57 01/25/2024 0435   BILITOT 0.9 01/25/2024 0435   BILITOT 0.5 01/12/2024 1048   GFRNONAA 52 (L) 01/25/2024 0435    GFRNONAA >60 01/12/2024 1048   GFRAA >60 06/13/2019 1350   Lipase     Component Value Date/Time   LIPASE 28 09/07/2023 2047       Studies/Results: No results found.   Anti-infectives: Anti-infectives (From admission, onward)    Start     Dose/Rate Route Frequency Ordered Stop   01/25/24 1500  vancomycin  (VANCOREADY) IVPB 1500 mg/300 mL        1,500 mg 150 mL/hr over 120 Minutes Intravenous Every 24 hours 01/24/24 1322     01/24/24 1115  vancomycin  (VANCOCIN ) 2,500 mg in sodium chloride  0.9 % 500 mL IVPB        2,500 mg 262.5 mL/hr over 120 Minutes Intravenous  Once 01/24/24 1029 01/24/24 1530   01/17/24 2245  piperacillin -tazobactam (ZOSYN ) IVPB 3.375 g        3.375 g 12.5 mL/hr over 240 Minutes Intravenous Every 8 hours 01/17/24 2149     01/17/24 2200  fluconazole  (DIFLUCAN ) IVPB 400 mg        400 mg 100 mL/hr over 120 Minutes Intravenous Every 24 hours 01/17/24 2059          Assessment/Plan Duodenal ulcer hemorrhage  PMH EGD and IR coil embolization of gastric ulcer in  August 2025 Presented to [facility] 12/16 w/ hematemesis, EGD 12/17 showed bleed from duodenal ulcer, clips placed Transferred to [facility] for critical care services and IR consult Arrived in hemorrhagic shock, critical, MTP started, taken emergently to the OR for below:    POD 9 S/P LAPAROTOMY, EXPLORATORY, Duodenotom, Ligation of GDA and branches, ABThera wound VAC placement 12/17 Dr. Rubin  POD 7 S/P ex lap, abdominal wall closure 12/19 Dr. Ebbie; some bleeding from liver capsule controlled with cautery and surgicel snow.  - no overt fevers.   - WBC back to normal.  - plan UGI today.  - if no leak, ok to do clear liquids.  - Continue wet-to-dry midline - Continue IV abx  - continue TPN.     Per CCM: Hemorrhagic shock  Metastatic lung CA VDRF COPD OSA AKI  PMH CVA PMH afib on eliquis  - DOAC held in the setting of acute bleeding   Discussed with RN   LOS: 9 days   I reviewed  critical care notes, specialist notes, nursing notes, last 24 h vitals and pain scores, last 48 h intake and output, last 24 h labs and trends, and last 24 h imaging results.  Raymond Hurst Nephew, MD, FACS, FSSO Surgical Oncology, General Surgery, Trauma and Critical [facility], [place] [phone] for weekday/non holidays Check [email or web address] for coverage night/weekend/holidays under General Surgery     "

## 2024-01-26 NOTE — Progress Notes (Signed)
 PHARMACY - TOTAL PARENTERAL NUTRITION CONSULT NOTE   Indication: prolonged NPOd/t hemorrhagic duodenal ulcer   Patient Measurements: Height: 6' 0.01 (182.9 cm) Weight: 127.2 kg (280 lb 6.8 oz) IBW/kg (Calculated) : 77.62 TPN AdjBW (KG): 88.1 Body mass index is 38.02 kg/m. Usual Weight: around 117-119 kg > current weight of ~135 kg likely not accurate and elevated 2/2 acute illness, IVF and edema  Assessment:  [name] with medical history significant for atrial fibrillation, hx stroke, COPD, lung cancer with mets, and multiple GIBs who is admitted with hematemesis after resuming apixaban  following recent admit 12/6-12/8 for melena. EGD on 12/8 showed non-bleeding duodenal ulcer with a clean ulcer base. He was transferred to [facility] for IR eval but not a candidate. He was taken to OR emergently for ex-lap duodenotomy and ligation of gastroduodenal artery. He is s/p MTP for duodenal hemorrhage requiring massive transfusion protocol, Kcentra , TXA and Factor VII. He has been NPO since surgery.  Pharmacy consulted to manage TPN.  Glucose / Insulin : A1c 5.5, BG <160, used 7 units SSI/ 24 hrs (also on D5% @ 280ml/hr)  Electrolytes: Na 148 down (none in TPN), K 3.4 (received IV 60 mEq IV), Cl 114  (max acetate in TPN),  CoCa 10, others wnl Renal:  SCr 1.43 improving (baseline 0.9), BUN 42 Hepatic: LFTs / Tbili WNL, albumin 2.6, TG 93 (12/22)  Intake / Output; MIVF: FW q4hr; UOP 2.4 mL/kg/hr, NGT removed, JP drain 15 ml, Net IO Since Admission: -17,137.66 mL [01/26/24 0816]  GI Imaging:  12/19 KUB: Embolization coils in upper abdomen  12/23 KUB: No bowel dilatation in the upper abdomen GI Surgeries / Procedures:  12/17 EGD: red blood entire stomach, duodenal bulb, duodenum s/p hemostatic clip, hemospray  12/17 ex-lap with ligation of GDA and branches, VAC placement 12/19 abd wall closure, bleeding from liver capsule controlled with cautery and surgicel snow 12/25 NGT removed  Central access:  CVC 01/17/24 TPN start date: 01/21/24  Nutritional Goals: Goal TPN rate is 85 mL/hr (provides ~120 g of protein and ~2100 kcals per day)  RD Assessment: Estimated Needs Total Energy Estimated Needs: 2100-2300 Total Protein Estimated Needs: 115-130g Total Fluid Estimated Needs: >/=2L  Current Nutrition:  12/21 NPO and TPN 12/24 D5W @ 129ml/hr = 180g dextrose  or 612 Kcal  12/25 D5W @ 240ml/hr = 240g dextrose , 816 kcal 12/26 D5W @ 261ml/hr = 240g dextrose , 816 kcal  Plan:   Adjust TPN with decreased calories given on D5W @ 150-245ml/hr since 12/23 for hypernatremia, TPN at 75 mL/hr at 1800, provides 135g protein, 1303 kcals, meeting 100% of estimated needs with D5W Electrolytes in TPN: Na 0 mEq/L, increase K 93 mEq/L (= 167 mEq, at Max with current bag volume and other lytes), decrease Ca 3 mEq/L, Mg 8 mEq/L, Phos 17 mmol/L. Max acetate Give Kcl 60 mEq IV Add standard MVI and trace elements to TPN  Decrease to sensitive q6hr SSI and stop if not needing Monitor TPN labs on Mon/Thurs and PRN F/u upper GI per surgery  F/u Na and D5W plan, adjust TPN accordingly  Thank you for allowing pharmacy to be a part of this patients care.  Jinnie Door, PharmD, BCPS, BCCP Clinical Pharmacist  Please check AMION for all [facility] phone numbers After 10:00 PM, call Main Pharmacy [phone]

## 2024-01-26 NOTE — Care Management Important Message (Signed)
 Important Message  Patient Details  Name: Raymond Hurst MRN: [id number] Date of Birth: 01-12-1951   Important Message Given:  Yes - Medicare IM     Claretta Deed 01/26/2024, 3:16 PM

## 2024-01-26 NOTE — Evaluation (Signed)
 Occupational Therapy Evaluation Patient Details Name: Raymond Hurst MRN: [id number] DOB: Sep 18, 1950 Today's Date: 01/26/2024   History of Present Illness   Pt is 9 yoinitially presenting to [facility] then transferring to [facility] on 12/16 due to dizziness hematemesis. 12/17 pt with continuing ongoing blood loss anemia without recurrent hematemesis and was given 2 U PRBC. EGD with findings of duodenal active bleed which was clipped. Pt was intubated and admitted to ICU on 12/17 then transferred to [facility]. 12/19 ex lap abdominal wall closure with some bleeding from liver capsule controlled with cautery.  PMH includes afib, gout, GI bleed, stage IV lung cancer, alcohol abuse.     Clinical Impressions At baseline, pt is Independent with ADLs, IADLs, and functional mobility without an AD. Pt now presents with decreased activity tolerance, decreased B UE strength (worse on Right), decreased B UE coordination (worse on Right); mildly edematous R UE, decreased knowledge of DME/AE, mildly decreased cognition, and decreased safety and independence with functional tasks. Upon OT arrival, pt found to have had a BM in the bed. Due to this, ADLs completed from bed level and linens changed. Pt demonstrates ability to complete UB ADLs largely with Set up to Mod assist and LB ADLs with Total assist +2 for safety/line and drain management from bed level. Pt also demonstrating ability to roll R with Min assist and Left with Mod assist with use of bed rails. Further mobility deferred this session due to pt fatigue following ADLs and rolling x2 each direction in bed. On 12/24, during PT eval, pt performed sit<->supine transfer with Mod to Max assist and STS with a RW with Mod assist. Pt VSS on RA throughout session. Pt participated well in session and is motivated to return to PLOF. Pt will benefit from acute skilled OT services to address deficits and increase safety and independence with functional tasks. Post acute discharge, pt will  benefit from intensive inpatient skilled rehab services < 3 hours per day to maximize rehab potential.      If plan is discharge home, recommend the following:   A lot of help with walking and/or transfers;Two people to help with bathing/dressing/bathroom;Assistance with cooking/housework;Direct supervision/assist for medications management;Assist for transportation;Help with stairs or ramp for entrance     Functional Status Assessment   Patient has had a recent decline in their functional status and demonstrates the ability to make significant improvements in function in a reasonable and predictable amount of time.     Equipment Recommendations   BSC/3in1;Wheelchair (measurements OT);Wheelchair cushion (measurements OT);Other (comment);Tub/shower bench (RW)     Recommendations for Other Services         Precautions/Restrictions   Precautions Precautions: Fall Recall of Precautions/Restrictions: Impaired Restrictions Weight Bearing Restrictions Per Provider Order: No     Mobility Bed Mobility Overal bed mobility: Needs Assistance Bed Mobility: Rolling Rolling: Min assist, Mod assist, +2 for safety/equipment, Used rails         General bed mobility comments: Min assist to roll R and Mod assist to roll L; upon OT arrival, pt found to have had a BM in the bed; pt rolled x3 each side for peri care and linen change. Further mobility deferred this session due to pt fatigue ADLs from bed level    Transfers Overall transfer level: Needs assistance                 General transfer comment: deferred this session due to pt fatigue following extensive bed mobility and ADls in the bed  Balance                                           ADL either performed or assessed with clinical judgement   ADL Overall ADL's : Needs assistance/impaired Eating/Feeding: Set up Eating/Feeding Details (indicate cue type and reason): to drink from cup with  straw; pt on clear liquid diet at this time Grooming: Set up   Upper Body Bathing: Moderate assistance;Cueing for compensatory techniques;Bed level Upper Body Bathing Details (indicate cue type and reason): functional level affected by presence of R LQ drain and abdominal incision Lower Body Bathing: Total assistance;+2 for safety/equipment;Bed level   Upper Body Dressing : Moderate assistance   Lower Body Dressing: Total assistance;+2 for safety/equipment;Bed level     Toilet Transfer Details (indicate cue type and reason): deferred this session due to pt fatigue following extensive bed mobility during ADLs Toileting- Clothing Manipulation and Hygiene: Total assistance;+2 for safety/equipment;Bed level         General ADL Comments: Pt with decreased activity tolerance, fatiguing quickly during tasks     Vision Baseline Vision/History: 1 Wears glasses (readers) Ability to See in Adequate Light: 0 Adequate (with glasses) Patient Visual Report: No change from baseline Vision Assessment?: No apparent visual deficits;Wears glasses for reading Additional Comments: Vision [facility] for tasks assessed; not formally screened or evaluated     Perception         Praxis         Pertinent Vitals/Pain Pain Assessment Pain Assessment: Faces Faces Pain Scale: Hurts even more Pain Location: abdomen at surgical site, worse with movement Pain Descriptors / Indicators: Aching, Discomfort, Guarding, Grimacing Pain Intervention(s): Limited activity within patient's tolerance, Monitored during session, Repositioned     Extremity/Trunk Assessment Upper Extremity Assessment Upper Extremity Assessment: Right hand dominant;RUE deficits/detail;LUE deficits/detail RUE Deficits / Details: generalized weakness with gross shoulder strength 2+/5 and otherwise grossly 3- to 3/5; AROM shoulder flexion to approximately 60 degrees and AAROM shoulder flexion to approximately 90 degrees; all other AROM WFL;  decreased coordination; decreased proprioception; mildly edematous in hand, wrist, and forearm RUE Sensation: decreased proprioception RUE Coordination: decreased fine motor;decreased gross motor LUE Deficits / Details: shoulder strength grossly 3/5; strength otherwise 4/5; AROM WFL; mildly decreased coordination LUE Coordination: decreased fine motor;decreased gross motor (mild)   Lower Extremity Assessment Lower Extremity Assessment: Defer to PT evaluation   Cervical / Trunk Assessment Cervical / Trunk Assessment: Normal   Communication Communication Communication: Impaired Factors Affecting Communication: Hearing impaired   Cognition Arousal: Alert Behavior During Therapy: WFL for tasks assessed/performed Cognition: Difficult to assess, Cognition impaired, No family/caregiver present to determine baseline Difficult to assess due to: Hard of hearing/deaf   Awareness: Intellectual awareness intact, Online awareness intact (intermittent online awareness)   Attention impairment (select first level of impairment): Alternating attention Executive functioning impairment (select all impairments): Organization OT - Cognition Comments: Pt AAOx4 and pleasant throughout session with mild cognitive deficits noted above. Pt with decreased awareness of lines, R LQ abd drain, and catheter. Pt often requiring increased time for processing and frequently requiring repeated questions/commands; however, suspect this is largely due to pt being [facility] rather than cognition.                 Following commands: Impaired Following commands impaired: Follows one step commands inconsistently, Follows multi-step commands with increased time (suspect due to pt being [facility])  Cueing  General Comments   Cueing Techniques: Verbal cues;Tactile cues;Visual cues;Gestural cues  VSS on RA throughout session. RN present and assisting during a large portion of session.   Exercises     Shoulder Instructions       Home Living Family/patient expects to be discharged to:: Private residence Living Arrangements: Spouse/significant other Available Help at Discharge: Family;Other (Comment) (spouse is not in good health and needs hip replacement) Type of Home: House Home Access: Stairs to enter [employer] of Steps: 2 Entrance Stairs-Rails: None Home Layout: Two level Alternate Level Stairs-Number of Steps: 15 Alternate Level Stairs-Rails: Right Bathroom Shower/Tub: Chief Strategy Officer: Handicapped height Bathroom Accessibility: Yes How Accessible: Accessible via walker Home Equipment: Rolling Walker (2 wheels)          Prior Functioning/Environment Prior Level of Function : Independent/Modified Independent;Driving             Mobility Comments: Ambulates without AD ADLs Comments: Psychologist, clinical; Ind with ADLs and IADLs; drives    OT Problem List: Decreased strength;Decreased range of motion;Decreased activity tolerance;Decreased coordination;Decreased knowledge of use of DME or AE;Increased edema   OT Treatment/Interventions: Self-care/ADL training;Therapeutic exercise;Energy conservation;DME and/or AE instruction;Therapeutic activities;Cognitive remediation/compensation;Patient/family education;Balance training      OT Goals(Current goals can be found in the care plan section)   Acute Rehab OT Goals Patient Stated Goal: to feel better, get stronger, and return home OT Goal Formulation: With patient Time For Goal Achievement: 02/09/24 Potential to Achieve Goals: Fair ADL Goals Pt Will Perform Upper Body Bathing: with contact guard assist;sitting Pt Will Perform Lower Body Bathing: with min assist;sitting/lateral leans;sit to/from stand (with adaptive equipment as needed) Pt Will Perform Upper Body Dressing: with contact guard assist;sitting Pt Will Perform Lower Body Dressing: with min assist;sitting/lateral leans;sit to/from stand (with adaptive  equipment as needed) Pt Will Transfer to Toilet: with contact guard assist;ambulating;bedside commode (with least restricitve AD) Pt Will Perform Toileting - Clothing Manipulation and hygiene: with min assist;sitting/lateral leans;sit to/from stand Pt/caregiver will Perform Home Exercise Program: Increased strength;Both right and left upper extremity;With Supervision;With written HEP provided (AROM progressing to theraband)   OT Frequency:  Min 2X/week    Co-evaluation              AM-PAC OT 6 Clicks Daily Activity     Outcome Measure Help from another person eating meals?: A Little (for drinking water  from a cup with straw; pt currently on clear liquid diet) Help from another person taking care of personal grooming?: A Little Help from another person toileting, which includes using toliet, bedpan, or urinal?: Total Help from another person bathing (including washing, rinsing, drying)?: A Lot Help from another person to put on and taking off regular upper body clothing?: A Lot Help from another person to put on and taking off regular lower body clothing?: Total 6 Click Score: 12   End of Session Nurse Communication: Mobility status;Other (comment) (Pt now on clear liquid diet. Pt found to have had a BM in the bed upon OT arrival.)  Activity Tolerance: Patient tolerated treatment well Patient left: in bed;with call bell/phone within reach;with bed alarm set  OT Visit Diagnosis: Muscle weakness (generalized) (M62.81);Other (comment) (decreased activity tolerance)                Time: 8572-8545 OT Time Calculation (min): 27 min Charges:  OT General Charges $OT Visit: 1 Visit OT Evaluation $OT Eval Moderate Complexity: 1 Mod OT Treatments $Self Care/Home Management : 8-22  mins  Margarie Rockey HERO., OTR/L, MA Acute Rehab [phone]   Margarie FORBES Horns 01/26/2024, 3:33 PM

## 2024-01-26 NOTE — NC FL2 (Signed)
 " [place]  MEDICAID FL2 LEVEL OF CARE FORM     IDENTIFICATION  Patient Name: Raymond Hurst Birthdate: Apr 07, 1950 Sex: male Admission Date (Current Location): 01/16/2024  [facility] and [place] Number:  Producer, Television/film/video and Address:  [facility], [address], [place], [place] [address]      Provider Number: [id number]  Attending Physician Name and Address:  Royal Sill, MD  Relative Name and Phone Number:       Current Level of Care: Hospital Recommended Level of Care: Skilled Nursing Facility Prior Approval Number:    Date Approved/Denied:   PASRR Number: [id number]  Discharge Plan: SNF    Current Diagnoses: Patient Active Problem List   Diagnosis Date Noted   Malnutrition of moderate degree 01/23/2024   Hemorrhagic shock (HCC) 01/18/2024   Hypotension due to hypovolemia 01/17/2024   Upper GI bleeding 01/16/2024   Melena 01/08/2024   Class II obesity 01/08/2024   Atrial fibrillation, chronic (HCC) 01/07/2024   OSA (obstructive sleep apnea) 01/07/2024   Chronic obstructive pulmonary disease (COPD) (HCC) 01/07/2024   Acute bronchitis 01/07/2024   GI bleeding 01/06/2024   GI bleed 09/07/2023   Acute on chronic anemia 09/07/2023   Pneumothorax after biopsy 08/28/2023   Metastasis to left adrenal gland of unknown origin (HCC) 07/05/2023   Lung cancer, upper lobe (HCC) 06/27/2023   Acute hypoxic respiratory failure (HCC) 06/13/2023   Atrial fibrillation with rapid ventricular response (HCC) 06/13/2023   Lung mass 06/05/2023   Dysphonia 06/05/2023   Gout 07/22/2022   Right adrenal mass 07/22/2022   Diverticulosis of intestine with bleeding 07/22/2022   Acute blood loss anemia 07/22/2022   Lower GI bleeding 07/21/2022   OSA on CPAP 05/10/2022   Alcoholism (HCC) 01/14/2021   Cardiac arrhythmia 01/14/2021   Hearing loss of left ear 01/14/2021   Long term (current) use of anticoagulants 01/14/2021   Lymphedema 01/14/2021    Neurocognitive disorder 01/14/2021   Tinnitus of right ear 01/14/2021   Vitamin B12 deficiency (non anemic) 01/14/2021   Vitamin D  deficiency 01/14/2021   DOE (dyspnea on exertion) 03/25/2020   Encounter for removal of sutures 12/24/2019   Dizziness 06/08/2018   TIA (transient ischemic attack) 06/08/2018   Paroxysmal atrial fibrillation (HCC) 06/08/2018   Near syncope 06/07/2018   Hypokalemia 06/07/2018   Chronic suppurative otitis media of left ear 04/04/2018   Presbycusis of both ears 04/04/2018   Unilateral deafness, left 04/04/2018   Subjective tinnitus of both ears 06/02/2015   Lymphedema of leg 04/08/2013   Cellulitis of right lower leg 04/04/2013   Morbid obesity due to excess calories (HCC) 04/04/2013   Alcohol abuse 04/04/2013   Cellulitis and abscess of leg 04/04/2013    Orientation RESPIRATION BLADDER Height & Weight     Self, Time, Situation, Place  O2 Incontinent Weight: 280 lb 6.8 oz (127.2 kg) Height:  6' 0.01 (182.9 cm)  BEHAVIORAL SYMPTOMS/MOOD NEUROLOGICAL BOWEL NUTRITION STATUS      Incontinent Diet (see dc summary)  AMBULATORY STATUS COMMUNICATION OF NEEDS Skin   Extensive Assist Verbally Skin abrasions (redness)                       Personal Care Assistance Level of Assistance  Bathing, Feeding, Dressing Bathing Assistance: Maximum assistance (total assist per OT) Feeding assistance: Independent Dressing Assistance: Maximum assistance (Total assist per OT)     Functional Limitations Info  Sight, Hearing, Speech Sight Info: Adequate Hearing Info: Impaired  Speech Info: Adequate    SPECIAL CARE FACTORS FREQUENCY  PT (By licensed PT), OT (By licensed OT)     PT Frequency: 5x/wk OT Frequency: 5x/wk            Contractures Contractures Info: Not present    Additional Factors Info  Code Status, Allergies Code Status Info: FULL Allergies Info: NKA           Current Medications (01/26/2024):  This is the current hospital active  medication list Current Facility-Administered Medications  Medication Dose Route Frequency Provider Last Rate Last Admin   acetaminophen  (TYLENOL ) suppository 650 mg  650 mg Rectal Q6H PRN Sharie Bourbon, MD       arformoterol  (BROVANA ) nebulizer solution 15 mcg  15 mcg Nebulization BID Ebbie Cough, MD   15 mcg at 01/26/24 0843   Chlorhexidine  Gluconate Cloth 2 % PADS 6 each  6 each Topical Daily Ebbie Cough, MD   6 each at 01/26/24 1020   dextrose  5 % solution   Intravenous Continuous Samtani, Jai-Gurmukh, MD       fentaNYL  (SUBLIMAZE ) bolus via infusion 25-100 mcg  25-100 mcg Intravenous Q15 min PRN Ebbie Cough, MD   100 mcg at 01/23/24 0102   fluconazole  (DIFLUCAN ) IVPB 400 mg  400 mg Intravenous Q24H Ebbie Cough, MD 100 mL/hr at 01/26/24 0020 400 mg at 01/26/24 0020   heparin  injection 5,000 Units  5,000 Units Subcutaneous Q8H Sharie Bourbon, MD   5,000 Units at 01/26/24 1259   hydrALAZINE  (APRESOLINE ) injection 20 mg  20 mg Intravenous Q4H PRN Paliwal, Ria, MD   20 mg at 01/24/24 0831   hydrOXYzine  (VISTARIL ) injection 25 mg  25 mg Intramuscular Q6H PRN Paliwal, Ria, MD   25 mg at 01/24/24 9671   insulin  aspart (novoLOG ) injection 0-9 Units  0-9 Units Subcutaneous Q6H Georgina Basket, MD       ipratropium-albuterol  (DUONEB) 0.5-2.5 (3) MG/3ML nebulizer solution 3 mL  3 mL Nebulization Q4H PRN Ebbie Cough, MD       labetalol  (NORMODYNE ) injection 20 mg  20 mg Intravenous Q2H PRN Paliwal, Aditya, MD   20 mg at 01/24/24 0630   ondansetron  (ZOFRAN ) injection 4 mg  4 mg Intravenous Q6H PRN Ebbie Cough, MD       Oral care mouth rinse  15 mL Mouth Rinse Q2H Hussein, Bourbon, MD   15 mL at 01/26/24 1426   Oral care mouth rinse  15 mL Mouth Rinse PRN Sharie Bourbon, MD       pantoprazole  (PROTONIX ) injection 40 mg  40 mg Intravenous Q12H Wakefield, Matthew, MD   40 mg at 01/26/24 1256   piperacillin -tazobactam (ZOSYN ) IVPB 3.375 g  3.375 g  Intravenous Q8H Wakefield, Matthew, MD 12.5 mL/hr at 01/26/24 1302 3.375 g at 01/26/24 1302   potassium chloride  10 mEq in 100 mL IVPB  10 mEq Intravenous Q1 Hr x 6 Chen, Lydia D, RPH 100 mL/hr at 01/26/24 1532 10 mEq at 01/26/24 1532   revefenacin  (YUPELRI ) nebulizer solution 175 mcg  175 mcg Nebulization Daily Ebbie Cough, MD   175 mcg at 01/26/24 0843   sodium chloride  flush (NS) 0.9 % injection 10-40 mL  10-40 mL Intracatheter PRN Sharie Bourbon, MD   10 mL at 01/26/24 1259   TPN ADULT (ION)   Intravenous Continuous TPN Sharie Bourbon, MD 85 mL/hr at 01/25/24 1717 New Bag at 01/25/24 1717   TPN ADULT (ION)   Intravenous Continuous TPN Laurence Jinnie BIRCH, [employer] [facility]  vancomycin  (VANCOREADY) IVPB 1500 mg/300 mL  1,500 mg Intravenous Q24H Lendell Latanya BIRCH, RPH 150 mL/hr at 01/25/24 1832 1,500 mg at 01/25/24 1832     Discharge Medications: Please see discharge summary for a list of discharge medications.  Relevant Imaging Results:  Relevant Lab Results:   Additional Information SSN: [id number]  Lendia Dais, LCSWA     "

## 2024-01-26 NOTE — Progress Notes (Signed)
 [facility]  Raymond Hurst FMW:[id number]  DOB: 1950/03/30  DOA: 01/16/2024  PCP: Raymond Senior, MD  01/26/2024,10:12 AM  LOS: 9 days    Code Status: Full code     from: Home  73 year old male Left lung mass bronchogenic CA (poorly differentiated squamous cell) diagnosed 06/05/2023-bronchoscopy 08/09/2023 complicated by left PNA thorax-follows with Dr. Arlester Hurst  and Raymond Hurst --currently durvalumab  Permanent A-fib CHA2DS2-VASc >4 on Eliquis  Previous heavy EtOH UGIB 09/09/2023 cratered ulcer duodenum status post mesenteric angio/embolization @ [facility] 12/6-12/8 readmission--with upper GI bleed clean-based ulcer told to continue twice daily PPI manage resumed Eliquis  hemoglobin was 9.6 on discharge  Events  12/16 [facility] ED eval dizziness nausea-couple of bright red vomit-->?  ICU [facility]--> [address] [place] transfer 2/2 12/17 ICU [facility]-->  [place] transfer 2/2--IR duodenal bleed Clip ->?  Continued ongoing bleeding requiring pressors--- massive transfusion protocol-----emergent ex lap Raymond Hurst with duodenotomy ligation GDA branches VAC placement 12/19 Secondary closure of abdomen Raymond Hurst  12/26 barium swallow shows no supposedly  Assessment  & Plan :    Duodenal ulcer hemorrhage with coiling 09/2023 Laparotomy 12/17, secondary closure 12/19 as above Defer to surgery management-looks like okay for clears--- pain management per general surgery  Sepsis secondary to above  Staph epi bacteremia-MRSE with resistances -likely peritonitis Remains on vancomycin --likely 10 to 14-day duration total treatment-ID formally consulted 12/26 Peritonitis  continues fluconazole  Zosyn  for peritonitis Blood cultures last performed 12/21 -repeat today 12/26 to see if can narrow or any organism growing Require central line for now given multiple infusions-remove the same 24 to 48-hour  Hypernatremia Secondary to free water  deficit-unfortunately PEG removed-is on TPN at 75, Continue D5 at 200 for 20  hours and hopefully this will resolve As the patient is able to take clears we can see how he does  AKI on admission--- resolving well-see above as well as fluid status Hypokalemia Getting 6 runs of potassium today-magnesium  seems correct Attempt Foley clamp tomorrow morning  Left lung mass bronchogenic CA Holding immunotherapy/chemotherapy Outpatient follow-up  Critical illness thrombocytopenia Just trend at this time-no schistocytes on smear review 12/18-improving slowly  Atrial fibrillation CHADVASC >4 previously on Eliquis  Anticoagulation held-likely needs discussion of the same going forward   Data Reviewed today: Sodium 140 potassium 3.4 chloride 114 BUN/creatinine 38/1.4 LFTs not done today Hemoglobin 15-WBC not done today  DVT prophylaxis: SCD   Dispo/Global plan: Still ill     Subjective:   Awake coherent no distress looks quite comfortable tolerating some clears Pain seems moderate No chest pain  Objective + exam Vitals:   01/25/24 2050 01/26/24 0332 01/26/24 0808 01/26/24 0843  BP:  (!) 148/94 (!) 142/90   Pulse: 97 98 89 87  Resp:  19 18 18   Temp:  98 F (36.7 C) 98 F (36.7 C)   TempSrc:      SpO2: 93% 94% 99% 97%  Weight:      Height:       Filed Weights   01/21/24 0324 01/22/24 0232 01/25/24 0452  Weight: (!) 139.4 kg 135.7 kg 127.2 kg   Examination: Obese poor dentition no JVD Central line right neck Slight lower extremity edema Wound not examined midline abdomen power 5/5 S1-S2 no murmur chest relatively clear no added sound no wheeze rales rhonchi  Scheduled Meds:  arformoterol   15 mcg Nebulization BID   Chlorhexidine  Gluconate Cloth  6 each Topical Daily   heparin  injection (subcutaneous)  5,000 Units Subcutaneous Q8H   insulin  aspart  0-9 Units Subcutaneous Q6H  mouth rinse  15 mL Mouth Rinse Q2H   pantoprazole  (PROTONIX ) IV  40 mg Intravenous Q12H   revefenacin   175 mcg Nebulization Daily   Continuous Infusions:  dextrose       fluconazole  (DIFLUCAN ) IV 400 mg (01/26/24 0020)   piperacillin -tazobactam (ZOSYN )  IV 3.375 g (01/26/24 1302)   potassium chloride  10 mEq (01/26/24 1532)   TPN ADULT (ION) 85 mL/hr at 01/25/24 1717   TPN ADULT (ION)     vancomycin  1,500 mg (01/25/24 1832)   acetaminophen , fentaNYL , hydrALAZINE , hydrOXYzine , ipratropium-albuterol , labetalol , ondansetron  (ZOFRAN ) IV, mouth rinse, sodium chloride  flush  Time 46  Raymond Tamiki Kuba, MD  [facility]

## 2024-01-26 NOTE — Care Management Important Message (Signed)
 Important Message  Patient Details  Name: Raymond Hurst MRN: [id number] Date of Birth: Jun 29, 1950   Important Message Given:  Yes - Medicare IM     Claretta Deed 01/26/2024, 3:09 PM

## 2024-01-27 DIAGNOSIS — K922 Gastrointestinal hemorrhage, unspecified: Secondary | ICD-10-CM | POA: Diagnosis not present

## 2024-01-27 LAB — RENAL FUNCTION PANEL
Albumin: 2.6 g/dL — ABNORMAL LOW (ref 3.5–5.0)
Anion gap: 8 (ref 5–15)
BUN: 38 mg/dL — ABNORMAL HIGH (ref 8–23)
CO2: 25 mmol/L (ref 22–32)
Calcium: 8.7 mg/dL — ABNORMAL LOW (ref 8.9–10.3)
Chloride: 112 mmol/L — ABNORMAL HIGH (ref 98–111)
Creatinine, Ser: 1.33 mg/dL — ABNORMAL HIGH (ref 0.61–1.24)
GFR, Estimated: 57 mL/min — ABNORMAL LOW
Glucose, Bld: 120 mg/dL — ABNORMAL HIGH (ref 70–99)
Phosphorus: 3.2 mg/dL (ref 2.5–4.6)
Potassium: 3.7 mmol/L (ref 3.5–5.1)
Sodium: 145 mmol/L (ref 135–145)

## 2024-01-27 LAB — HEMOGLOBIN AND HEMATOCRIT, BLOOD
HCT: 47.6 % (ref 39.0–52.0)
HCT: 50.3 % (ref 39.0–52.0)
Hemoglobin: 15.2 g/dL (ref 13.0–17.0)
Hemoglobin: 15.8 g/dL (ref 13.0–17.0)

## 2024-01-27 LAB — GLUCOSE, CAPILLARY
Glucose-Capillary: 123 mg/dL — ABNORMAL HIGH (ref 70–99)
Glucose-Capillary: 106 mg/dL — ABNORMAL HIGH (ref 70–99)
Glucose-Capillary: 107 mg/dL — ABNORMAL HIGH (ref 70–99)

## 2024-01-27 LAB — MAGNESIUM: Magnesium: 1.9 mg/dL (ref 1.7–2.4)

## 2024-01-27 MED ORDER — TRAVASOL 10 % IV SOLN
INTRAVENOUS | Status: AC
  Filled 2024-01-27: qty 1203.6

## 2024-01-27 MED ORDER — SODIUM CHLORIDE (PF) 0.9 % IJ SOLN
INTRAMUSCULAR | Status: AC
  Filled 2024-01-27: qty 10

## 2024-01-27 MED ORDER — ADULT MULTIVITAMIN W/MINERALS CH
1.0000 | ORAL_TABLET | Freq: Every day | ORAL | Status: DC
  Administered 2024-01-28 – 2024-02-21 (×24): 1 via ORAL
  Filled 2024-01-27 (×18): qty 1

## 2024-01-27 MED ORDER — BUDESON-GLYCOPYRROL-FORMOTEROL 160-9-4.8 MCG/ACT IN AERO
2.0000 | INHALATION_SPRAY | Freq: Two times a day (BID) | RESPIRATORY_TRACT | Status: DC
  Administered 2024-01-27 – 2024-02-21 (×47): 2 via RESPIRATORY_TRACT
  Filled 2024-01-27 (×2): qty 5.9

## 2024-01-27 MED ORDER — HYDROCODONE-ACETAMINOPHEN 5-325 MG PO TABS: 1.0000 | ORAL_TABLET | Freq: Four times a day (QID) | ORAL | Status: DC | PRN

## 2024-01-27 MED ORDER — POTASSIUM CHLORIDE 10 MEQ/100ML IV SOLN
10.0000 meq | INTRAVENOUS | Status: AC
  Administered 2024-01-27 (×2): 10 meq via INTRAVENOUS
  Filled 2024-01-27 (×4): qty 100

## 2024-01-27 NOTE — Progress Notes (Signed)
 "  Progress Note  8 Days Post-Op  Subjective: Experienced emesis, made NPO Multiple BM Cr 1.3 from 1.4, Na 145  On exam, patient resting in bedside chair. He reports that the nausea he experienced yesterday was after taking a chocolate drink followed by water . Denies current nausea and reports he is hungry.    Objective: Vital signs in last 24 hours: Temp:  [97.7 F (36.5 C)-98.5 F (36.9 C)] 98.5 F (36.9 C) (12/27 0440) Pulse Rate:  [73-91] 73 (12/27 0440) Resp:  [19-22] 22 (12/27 0440) BP: (133-145)/(77-96) 133/77 (12/27 0440) SpO2:  [95 %-100 %] 95 % (12/27 0440) Last BM Date : 01/26/24  Intake/Output from previous day: 12/26 0701 - 12/27 0700 In: 4697.3 [P.O.:50; I.V.:3408.6; IV Piggyback:1238.7] Out: 86946 [Urine:13050; Stool:3] Intake/Output this shift: Total I/O In: 325.8 [I.V.:311.6; IV Piggyback:14.2] Out: 1000 [Urine:1000]  PE: Gen:  NAD. Pulm:  breathing comfortably Abd: Soft, non-distended, non-tender. Midline incision with dressing intactaaaaaaaaaaaaaaaaaaaaaaaaaaaaaaaaaaaaaaaaaaaaaaaaaaaaaaaaaaaaaaaaaaaaaaaaaaaaaaaaaa; Right JP drain with dark output that appears like old blood, low volume. Skin: Warm and dry, edematous, chronic lower extremity skin changes   Lab Results:  Recent Labs    01/26/24 1811 01/27/24 0316  HGB 15.7 15.2  HCT 49.4 47.6   BMET Recent Labs    01/26/24 1015 01/27/24 0316  NA 148* 145  K 3.4* 3.7  CL 114* 112*  CO2 26 25  GLUCOSE 121* 120*  BUN 38* 38*  CREATININE 1.43* 1.33*  CALCIUM  8.9 8.7*   PT/INR No results for input(s): LABPROT, INR in the last 72 hours.  CMP     Component Value Date/Time   NA 145 01/27/2024 0316   NA 137 06/01/2023 1439   K 3.7 01/27/2024 0316   CL 112 (H) 01/27/2024 0316   CO2 25 01/27/2024 0316   GLUCOSE 120 (H) 01/27/2024 0316   BUN 38 (H) 01/27/2024 0316   BUN 9 06/01/2023 1439   CREATININE 1.33 (H) 01/27/2024 0316   CREATININE 0.91 01/12/2024 1048   CALCIUM  8.7 (L)  01/27/2024 0316   PROT 5.4 (L) 01/25/2024 0435   PROT 6.2 06/01/2023 1439   ALBUMIN 2.6 (L) 01/27/2024 0316   ALBUMIN 3.0 (L) 06/01/2023 1439   AST 21 01/25/2024 0435   AST 14 (L) 01/12/2024 1048   ALT 12 01/25/2024 0435   ALT 5 01/12/2024 1048   ALKPHOS 57 01/25/2024 0435   BILITOT 0.9 01/25/2024 0435   BILITOT 0.5 01/12/2024 1048   GFRNONAA 57 (L) 01/27/2024 0316   GFRNONAA >60 01/12/2024 1048   GFRAA >60 06/13/2019 1350   Lipase     Component Value Date/Time   LIPASE 28 09/07/2023 2047       Studies/Results: DG UGI W SINGLE CM (SOL OR THIN BA) Result Date: 01/26/2024 CLINICAL DATA:  Perforated ulcer EXAM: WATER  SOLUBLE UPPER GI SERIES TECHNIQUE: Single-column upper GI series was performed using water  soluble contrast. Radiation Exposure Index (as provided by the fluoroscopic device): 213.5 mGy Kerma CONTRAST:  OMNIPAQUE  IOHEXOL  300 MG/ML  SOLN COMPARISON:  CT scan of January 06, 2024 FLUOROSCOPY: Radiation Exposure Index (if provided by the fluoroscopic device): 213.5 mGy FINDINGS: Visualized portion of the esophagus is unremarkable. The flow of contrast from esophagus into stomach is noted. Contrast slowly flowed from the stomach into duodenum. No definite leakage or extravasation is noted. Large diverticulum is seen arising from third portion of duodenum. Embolization coils are noted near second portion of duodenum. Visualized portion of proximal small bowel is otherwise unremarkable. IMPRESSION: No definite leakage or  extravasation of contrast is noted. Large diverticulum is seen arising from third portion of duodenum. Electronically Signed   By: Lynwood Landy Raddle M.D.   On: 01/26/2024 12:29     Anti-infectives: Anti-infectives (From admission, onward)    Start     Dose/Rate Route Frequency Ordered Stop   01/25/24 1500  vancomycin  (VANCOREADY) IVPB 1500 mg/300 mL        1,500 mg 150 mL/hr over 120 Minutes Intravenous Every 24 hours 01/24/24 1322     01/24/24 1115   vancomycin  (VANCOCIN ) 2,500 mg in sodium chloride  0.9 % 500 mL IVPB        2,500 mg 262.5 mL/hr over 120 Minutes Intravenous  Once 01/24/24 1029 01/24/24 1530   01/17/24 2245  piperacillin -tazobactam (ZOSYN ) IVPB 3.375 g        3.375 g 12.5 mL/hr over 240 Minutes Intravenous Every 8 hours 01/17/24 2149     01/17/24 2200  fluconazole  (DIFLUCAN ) IVPB 400 mg        400 mg 100 mL/hr over 120 Minutes Intravenous Every 24 hours 01/17/24 2059          Assessment/Plan Duodenal ulcer hemorrhage  PMH EGD and IR coil embolization of gastric ulcer in August 2025 Presented to [place] 12/16 w/ hematemesis, EGD 12/17 showed bleed from duodenal ulcer, clips placed Transferred to [facility] for critical care services and IR consult Arrived in hemorrhagic shock, critical, MTP started, taken emergently to the OR for below:    POD 10 S/P LAPAROTOMY, EXPLORATORY, Duodenotom, Ligation of GDA and branches, ABThera wound VAC placement 12/17 Dr. Rubin  POD 8 S/P ex lap, abdominal wall closure 12/19 Dr. Ebbie; some bleeding from liver capsule controlled with cautery and surgicel snow.    - UGI 12/26 w/o evidence of leak, JP output stable - Okay to resume CLD - Surgery will follow     Per CCM: Hemorrhagic shock  Metastatic lung CA VDRF COPD OSA AKI  PMH CVA PMH afib on eliquis  - DOAC held in the setting of acute bleeding   Discussed with RN   LOS: 10 days   I reviewed critical care notes, specialist notes, nursing notes, last 24 h vitals and pain scores, last 48 h intake and output, last 24 h labs and trends, and last 24 h imaging results.  Cordella Idler, MD [facility], [place] [phone] for weekday/non holidays Check [email or web address] for coverage night/weekend/holidays under General Surgery     "

## 2024-01-27 NOTE — Progress Notes (Signed)
 [facility]  Raymond Hurst FMW:[id number]  DOB: 08-15-1950  DOA: 01/16/2024  PCP: Raymond Senior, MD  01/27/2024,5:33 PM  LOS: 10 days    Code Status: Full code     from: Home  73 year old male Left lung mass bronchogenic CA (poorly differentiated squamous cell) diagnosed 06/05/2023-bronchoscopy 08/09/2023 complicated by left PNA thorax-follows with Dr. Arlester carboplatin  and Hurst --currently durvalumab  Permanent A-fib CHA2DS2-VASc >4 on Eliquis  Previous heavy EtOH UGIB 09/09/2023 cratered ulcer duodenum status post mesenteric angio/embolization @ [facility] 12/6-12/8 readmission--with upper GI bleed clean-based ulcer told to continue twice daily PPI manage resumed Eliquis  hemoglobin was 9.6 on discharge  Events  12/16 [facility] ED eval dizziness nausea-couple of bright red vomit-->?  ICU [facility]--> [facility] [address] [place] transfer 2/2 12/17 ICU [facility]-->  [place] transfer 2/2--IR duodenal bleed Clip ->?  Continued ongoing bleeding requiring pressors--- massive transfusion protocol-----emergent ex lap Raymond Hurst with duodenotomy ligation GDA branches VAC placement 12/19 Secondary closure of abdomen Raymond Hurst  12/26 barium swallow shows no supposedly  Assessment  & Plan :    Duodenal ulcer hemorrhage with coiling 09/2023 Laparotomy 12/17, secondary closure 12/19 as above Continues TPN rate being adjusted downwards as is now on clear liquids Vomiting resolved from 12/26 Passing stool which seems brown with old blood in it-hemoglobin is stable-pain seems controlled-stop fentanyl  IV changed to Norco 5/325 Q6  Sepsis secondary to above  Staph epi bacteremia-MRSE with resistances -likely peritonitis Remains on vancomycin --likely 10 to 14-day duration total treatment-ID formally consulted 12/26 Peritonitis  continues fluconazole  Zosyn  for peritonitis Blood cultures last performed 12/21 -repeat today 12/26 --- [facility] 12/26 negative so far-defer to ID regarding narrowing Require central line for now given  multiple infusions-remove the same 24 to 48-hour  Hypernatremia Resolved-D5 discontinued-replace fluids with TPN-regular diet slowly but surely  AKI on admission--- resolving well-see above as well as fluid status Hypokalemia Resolved Clamp catheter tomorrow  Left lung mass bronchogenic CA Holding immunotherapy/chemotherapy Outpatient follow-up  Critical illness thrombocytopenia Just trend at this time-no schistocytes on smear review 12/18-improving slowly  Atrial fibrillation CHADVASC >4 previously on Eliquis  Anticoagulation held-likely needs discussion of the same going forward   Data Reviewed today: \Sodium 145 potassium 3.7 chloride 112 BUN/creatinine 38/1.3 LFTs normal hemoglobin 15  DVT prophylaxis: SCD   Dispo/Global plan: Still ill     Subjective:   Comfortable getting up and down to restroom pain seems moderate  Objective + exam Vitals:   01/26/24 1722 01/26/24 2142 01/26/24 2212 01/27/24 0440  BP: (!) 145/88 (!) 133/96  133/77  Pulse: 91 82  73  Resp: 19 20  (!) 22  Temp: 97.7 F (36.5 C) 98.2 F (36.8 C)  98.5 F (36.9 C)  TempSrc:  Oral  Axillary  SpO2: 95% 100% 95% 95%  Weight:      Height:       Filed Weights   01/21/24 0324 01/22/24 0232 01/25/24 0452  Weight: (!) 139.4 kg 135.7 kg 127.2 kg   Examination: Obese poor dentition no JVD Central line right neck -abdomen slight distention JP drain in place looks tinged with blood Trace lower extremity edema Chest relatively clear  Scheduled Meds:  budesonide -glycopyrrolate -formoterol   2 puff Inhalation BID   Chlorhexidine  Gluconate Cloth  6 each Topical Daily   heparin  injection (subcutaneous)  5,000 Units Subcutaneous Q8H   [START ON 01/28/2024] multivitamin with minerals  1 tablet Oral Daily   mouth rinse  15 mL Mouth Rinse Q2H   pantoprazole  (PROTONIX ) IV  40 mg Intravenous Q12H  Continuous Infusions:  fluconazole  (DIFLUCAN ) IV 400 mg (01/26/24 2328)   piperacillin -tazobactam (ZOSYN )  IV  3.375 g (01/27/24 1336)   TPN ADULT (ION) 75 mL/hr at 01/26/24 1801   TPN ADULT (ION) 85 mL/hr at 01/27/24 1731   vancomycin  1,500 mg (01/27/24 1643)   acetaminophen , fentaNYL , hydrALAZINE , hydrOXYzine , ipratropium-albuterol , labetalol , ondansetron  (ZOFRAN ) IV, mouth rinse, sodium chloride  flush  Time 26  Raymond Rajanee Schuelke, MD  [facility]

## 2024-01-27 NOTE — Progress Notes (Signed)
 PHARMACY - TOTAL PARENTERAL NUTRITION CONSULT NOTE   Indication: prolonged NPOd/t hemorrhagic perforated duodenal ulcer   Patient Measurements: Height: 6' 0.01 (182.9 cm) Weight: 127.2 kg (280 lb 6.8 oz) IBW/kg (Calculated) : 77.62 TPN AdjBW (KG): 88.1 Body mass index is 38.02 kg/m. Usual Weight: around 117-119 kg > current weight of ~135 kg likely not accurate and elevated 2/2 acute illness, IVF and edema  Assessment:  65 YOM with medical history significant for atrial fibrillation, hx stroke, COPD, lung cancer with mets, and multiple GIBs who is admitted with hematemesis after resuming apixaban  following recent admit 12/6-12/8 for melena. EGD on 12/8 showed non-bleeding duodenal ulcer with a clean ulcer base. He was transferred to [facility] for IR eval but not a candidate. He was taken to OR emergently for ex-lap duodenotomy and ligation of gastroduodenal artery. He is s/p MTP for duodenal hemorrhage requiring massive transfusion protocol, Kcentra , TXA and Factor VII. He has been NPO since surgery.  Pharmacy consulted to manage TPN.  Glucose / Insulin : A1c 5.5, BG <130, used 1 unit SSI/ 24 hrs (also on D5W @ 248ml/hr)  Electrolytes: Na 145 down (none in TPN), K 3.7 (received IV 60 mEq IV), Cl 112 (max acetate in TPN), CoCa 9.8, mg 1.9, others wnl Renal:  SCr 1.33 (baseline 0.9), BUN 38 Hepatic: LFTs / Tbili WNL, albumin 2.6, TG 93 (12/22)  Intake / Output; MIVF: D5W @ 278ml/hr; UOP 4.3 mL/kg/hr, 12/26 stool x6;  Net IO Since Admission: -26,167.55 mL [01/27/24 0901]  GI Imaging:  12/19 KUB: Embolization coils in upper abdomen  12/23 KUB: No bowel dilatation in the upper abdomen 12/26 UGI: No definite leakage or extravasation of contrast is noted. Large diverticulum is seen arising from third portion of duodenum GI Surgeries / Procedures:  12/17 EGD: red blood entire stomach, duodenal bulb, duodenum s/p hemostatic clip, hemospray  12/17 ex-lap with ligation of GDA and branches, VAC  placement 12/19 abd wall closure, bleeding from liver capsule controlled with cautery and surgicel snow 12/25 NGT removed  Central access: CVC 01/17/24 TPN start date: 01/21/24  Nutritional Goals: Goal TPN rate is 85 mL/hr (provides 120 g of protein and 2104 kcals per day)  RD Assessment: Estimated Needs Total Energy Estimated Needs: 2100-2300 Total Protein Estimated Needs: 115-130g Total Fluid Estimated Needs: >/=2L  Current Nutrition:  12/21 NPO and TPN 12/24 D5W @ 159ml/hr = 180g dextrose  or 612 Kcal  12/25 D5W @ 2107ml/hr = 240g dextrose , 816 kcal 12/26 D5W @ 271ml/hr = 240g dextrose , 816 kcal 12/27 D5W @ 268ml/hr to end at 1400, CLD  Plan:    Stopping D5W per MD, Adjust TPN back to goal at 85 mL/hr at 1800, meeting 100% of estimated needs with D5W Electrolytes in TPN: Na 0 mEq/L, increase K 90 mEq/L (= 184 mEq), Ca 3 mEq/L, increase Mg 10 mEq/L, Phos 15 mmol/L. Max acetate Give Kcl 20 mEq IV Remove standard MVI and trace elements to TPN, give as po  Stop sensitive q6hr SSI and BG checks Monitor TPN labs on Mon/Thurs and PRN   Watch Na- none in TPN, watch K- high amount in TPN F/u po intake, wean TPN as able   Thank you for allowing pharmacy to be a part of this patients care.  Jinnie Door, PharmD, BCPS, BCCP Clinical Pharmacist  Please check AMION for all [facility] Pharmacy phone numbers After 10:00 PM, call Main Pharmacy [phone]

## 2024-01-28 DIAGNOSIS — K922 Gastrointestinal hemorrhage, unspecified: Secondary | ICD-10-CM | POA: Diagnosis not present

## 2024-01-28 LAB — RENAL FUNCTION PANEL
Albumin: 2.9 g/dL — ABNORMAL LOW (ref 3.5–5.0)
Anion gap: 10 (ref 5–15)
BUN: 40 mg/dL — ABNORMAL HIGH (ref 8–23)
CO2: 25 mmol/L (ref 22–32)
Calcium: 8.9 mg/dL (ref 8.9–10.3)
Chloride: 112 mmol/L — ABNORMAL HIGH (ref 98–111)
Creatinine, Ser: 1.34 mg/dL — ABNORMAL HIGH (ref 0.61–1.24)
GFR, Estimated: 57 mL/min — ABNORMAL LOW
Glucose, Bld: 116 mg/dL — ABNORMAL HIGH (ref 70–99)
Phosphorus: 3.6 mg/dL (ref 2.5–4.6)
Potassium: 3.9 mmol/L (ref 3.5–5.1)
Sodium: 146 mmol/L — ABNORMAL HIGH (ref 135–145)

## 2024-01-28 LAB — HEMOGLOBIN AND HEMATOCRIT, BLOOD
HCT: 48.8 % (ref 39.0–52.0)
HCT: 51.7 % (ref 39.0–52.0)
Hemoglobin: 15.6 g/dL (ref 13.0–17.0)
Hemoglobin: 16.6 g/dL (ref 13.0–17.0)

## 2024-01-28 LAB — MAGNESIUM: Magnesium: 2.2 mg/dL (ref 1.7–2.4)

## 2024-01-28 LAB — VANCOMYCIN, PEAK: Vancomycin Pk: 32 ug/mL (ref 30–40)

## 2024-01-28 MED ORDER — GERHARDT'S BUTT CREAM
TOPICAL_CREAM | Freq: Two times a day (BID) | CUTANEOUS | Status: DC
  Administered 2024-02-11: 1 via TOPICAL
  Filled 2024-01-28: qty 60

## 2024-01-28 MED ORDER — CARBAMIDE PEROXIDE 6.5 % OT SOLN
5.0000 [drp] | Freq: Two times a day (BID) | OTIC | Status: AC
  Administered 2024-01-28 – 2024-02-02 (×10): 5 [drp] via OTIC
  Filled 2024-01-28: qty 15

## 2024-01-28 MED ORDER — TRAVASOL 10 % IV SOLN
INTRAVENOUS | Status: AC
  Filled 2024-01-28: qty 1203.6

## 2024-01-28 NOTE — Progress Notes (Signed)
" °  Progress Note   Patient: Raymond Hurst FMW:[id number] DOB: 10/28/1950 DOA: 01/16/2024     11 DOS: the patient was seen and examined on 01/28/2024   Brief hospital course: 73 year old male Left lung mass bronchogenic CA (poorly differentiated squamous cell) diagnosed 06/05/2023-bronchoscopy 08/09/2023 complicated by left PNA thorax-follows with Dr. Arlester carboplatin  and Taxol --currently durvalumab  Permanent A-fib CHA2DS2-VASc >4 on Eliquis  Previous heavy EtOH UGIB 09/09/2023 cratered ulcer duodenum status post mesenteric angio/embolization @ [facility] 12/6-12/8 readmission--with upper GI bleed clean-based ulcer told to continue twice daily PPI manage resumed Eliquis  hemoglobin was 9.6 on discharge   Events  12/16 [facility] ED eval dizziness nausea-couple of bright red vomit-->?  ICU [facility]--> [address] [place] transfer 2/2 12/17 ICU [facility]-->  [place] transfer 2/2--IR duodenal bleed Clip ->?  Continued ongoing bleeding requiring pressors--- massive transfusion protocol-----emergent ex lap Dr. Rubin with duodenotomy ligation GDA branches VAC placement 12/19 Secondary closure of abdomen Dr. Ebbie  12/26 barium swallow performed  Assessment and Plan:   Duodenal ulcer hemorrhage with coiling 09/2023 Laparotomy 12/17, secondary closure 12/19 as above Continues TPN rate being adjusted downwards as is now on clear liquids Vomiting resolved from 12/26 Cont clears per surgery   Sepsis secondary to above  Staph epi bacteremia-MRSE with resistances -likely peritonitis Remains on vancomycin --likely 10 to 14-day duration total treatment-ID formally consulted 12/26 Peritonitis          continues fluconazole  Zosyn  for peritonitis Blood cultures last performed 12/21 -repeat cx on 12/26 neg  Require central line for now given multiple infusions-remove the same 24 to 48-hour   Hypernatremia Resolved-D5 discontinued-replace fluids with TPN-regular diet slowly but surely   AKI on admission---  resolving well-see above as well as fluid status Hypokalemia Resolved Clamp catheter tomorrow   Left lung mass bronchogenic CA Holding immunotherapy/chemotherapy Outpatient follow-up   Critical illness thrombocytopenia Just trend at this time-no schistocytes on smear review 12/18-improving slowly   Atrial fibrillation CHADVASC >4 previously on Eliquis  Anticoagulation held-likely needs discussion of the same going forward R Arm swelling Will check RUE doppler      Subjective: complaining of earwax buildup in R ear. Also recent R arm swelling and decreased rom, both of which has improved today  Physical Exam: Vitals:   01/28/24 0449 01/28/24 0829 01/28/24 0832 01/28/24 1641  BP: (!) 135/90  (!) 162/99 130/89  Pulse: (!) 101  95 91  Resp: 16  18 18   Temp: 97.6 F (36.4 C)  (!) 97.4 F (36.3 C)   TempSrc:   Oral Oral  SpO2: 94% 96% 95% 95%  Weight:      Height:       General exam: Awake, laying in bed, in nad Respiratory system: Normal respiratory effort, no wheezing Cardiovascular system: regular rate, s1, s2 Gastrointestinal system: Soft, nondistended, positive BS Central nervous system: CN2-12 grossly intact, strength intact Extremities: Perfused, no clubbing Skin: Normal skin turgor, no notable skin lesions seen Psychiatry: Mood normal // no visual hallucinations   Data Reviewed:  Labs reviewed: Na 146, K 3.9, Cr 1.34, Hgb 15.6   Family Communication: Pt in room, family at bedside  Disposition: Status is: Inpatient Remains inpatient appropriate because: severity of illness  Planned Discharge Destination: Skilled nursing facility    Author: Garnette Pelt, MD 01/28/2024 7:08 PM  For on call review [email or web address].  "

## 2024-01-28 NOTE — Progress Notes (Signed)
 Patient felt the urge to urinate during foley clamp trial. Foley unclamped at 2000.

## 2024-01-28 NOTE — Plan of Care (Signed)

## 2024-01-28 NOTE — Progress Notes (Signed)
 "  Trauma/Critical Care Follow Up Note  Subjective:    Overnight Issues:   Objective:  Vital signs for last 24 hours: Temp:  [97.4 F (36.3 C)-97.7 F (36.5 C)] 97.4 F (36.3 C) (12/28 0832) Pulse Rate:  [49-101] 95 (12/28 0832) Resp:  [16-18] 18 (12/28 0832) BP: (132-162)/(86-99) 162/99 (12/28 0832) SpO2:  [94 %-99 %] 95 % (12/28 0832)  Intake/Output from previous day: 12/27 0701 - 12/28 0700 In: 1224.6 [I.V.:1089.8; IV Piggyback:134.8] Out: 8350 [Urine:8350]  Intake/Output this shift: Total I/O In: 1758.1 [I.V.:1382.5; IV Piggyback:375.7] Out: 700 [Urine:700]  Vent settings for last 24 hours:    Physical Exam:  Gen: comfortable, no distress Neuro: follows commands, alert, communicative HEENT: PERRL Neck: supple CV: RRR Pulm: unlabored breathing on RA Abd: soft, NT, midline wound with granulation tissue, JP old bloody appearance, +BM GU: urine clear and yellow, +spontaneous voids Extr: wwp, no edema  Results for orders placed or performed during the hospital encounter of 01/16/24 (from the past 24 hours)  Glucose, capillary     Status: Abnormal   Collection Time: 01/27/24  6:11 PM  Result Value Ref Range   Glucose-Capillary 106 (H) 70 - 99 mg/dL  Hemoglobin and hematocrit, blood     Status: None   Collection Time: 01/27/24  7:49 PM  Result Value Ref Range   Hemoglobin 15.8 13.0 - 17.0 g/dL   HCT 49.6 60.9 - 47.9 %  Glucose, capillary     Status: Abnormal   Collection Time: 01/27/24  8:17 PM  Result Value Ref Range   Glucose-Capillary 107 (H) 70 - 99 mg/dL  Hemoglobin and hematocrit, blood     Status: None   Collection Time: 01/28/24  3:55 AM  Result Value Ref Range   Hemoglobin 15.6 13.0 - 17.0 g/dL   HCT 51.1 60.9 - 47.9 %  Renal function panel     Status: Abnormal   Collection Time: 01/28/24  3:55 AM  Result Value Ref Range   Sodium 146 (H) 135 - 145 mmol/L   Potassium 3.9 3.5 - 5.1 mmol/L   Chloride 112 (H) 98 - 111 mmol/L   CO2 25 22 - 32 mmol/L    Glucose, Bld 116 (H) 70 - 99 mg/dL   BUN 40 (H) 8 - 23 mg/dL   Creatinine, Ser 8.65 (H) 0.61 - 1.24 mg/dL   Calcium  8.9 8.9 - 10.3 mg/dL   Phosphorus 3.6 2.5 - 4.6 mg/dL   Albumin 2.9 (L) 3.5 - 5.0 g/dL   GFR, Estimated 57 (L) >60 mL/min   Anion gap 10 5 - 15  Magnesium      Status: None   Collection Time: 01/28/24  3:55 AM  Result Value Ref Range   Magnesium  2.2 1.7 - 2.4 mg/dL    Assessment & Plan:  LOS: 11 days   Additional comments:I reviewed the patient's new clinical lab test results.   and I reviewed the patients new imaging test results.    Duodenal ulcer hemorrhage  PMH EGD and IR coil embolization of gastric ulcer in August 2025 Presented to [place] 12/16 w/ hematemesis, EGD 12/17 showed bleed from duodenal ulcer, clips placed Transferred to [facility] for critical care services and IR consult Arrived in hemorrhagic shock, critical, MTP started, taken emergently to the OR for below:    POD 11 S/P LAPAROTOMY, EXPLORATORY, Duodenotom, Ligation of GDA and branches, ABThera wound VAC placement 12/17 Dr. Rubin  POD 9 S/P ex lap, abdominal wall closure 12/19 Dr. Ebbie; some bleeding from  liver capsule controlled with cautery and surgicel snow.    - UGI 12/26 w/o evidence of leak, JP output stable - Tol CLD, adv to FL[facility] - Surgery will follow   Per CCM: Hemorrhagic shock  Metastatic lung CA VDRF COPD OSA AKI  PMH CVA PMH afib on eliquis  - DOAC held in the setting of acute bleeding   Raymond GEANNIE Hanger, MD Trauma & General Surgery Please use [email or web address] to contact on call provider  01/28/2024  *Care during the described time interval was provided by me. I have reviewed this patient's available data, including medical history, events of note, physical examination and test results as part of my evaluation.    "

## 2024-01-28 NOTE — Hospital Course (Addendum)
 The patient is  73 year old male w/ Left lung mass bronchogenic CA (poorly differentiated squamous cell) diagnosed 06/05/2023-bronchoscopy 08/09/2023 complicated by left PNA thorax-follows with Dr. Arlester carboplatin  and Taxol --currently durvalumab ,  Permanent A-fib CHA2DS2-VASc >4 on Eliquis , Previous heavy EtOH, UGIB 09/09/2023 cratered ulcer duodenum status post mesenteric angio/embolization @ [facility] with  12/6-12/8 readmission--with upper GI bleed clean-based ulcer told to continue twice daily PPI manage resumed Eliquis  hemoglobin was 9.6 on discharge at that time  Significant Events: 12/16 [facility] ED eval dizziness nausea-couple of bright red vomit-->?  ICU [facility]--> 3 W.  transfer 2/2 12/17 ICU [facility]-->  [facility] transfer 2/2--IR duodenal bleed Clip ->?  Continued ongoing bleeding requiring pressors--- massive transfusion protocol-----emergent ex lap Dr. Rubin with duodenotomy ligation GDA branches VAC placement 12/19 Secondary closure of abdomen Dr. Ebbie  12/26 barium swallow performed and upper GI 1226 without evidence of leak.  Prolonged hospital course and was intermittently bleeding with slow easy.  CT angio on Monday showed no evidence of active bleeding.  DOAC was held but he was resumed on a heparin  drip and patient had negative stools but hemoglobin stable.  Subsequently patient had more symptomatic anemia and received an hemoglobin dropped and repeat CT EGD was done and showed no active bleeding in the upper GI tract.  02/12/24: Hemoglobin continues to trend slowly downwards and he was a little lightheaded today.  GI recommends continue observation and no repeat colonoscopy at this time.  Patient's wife was very frustrated and wanted a second GI opinion and has transferred the patient's care to [facility] in outpatient setting and [facility] graciously will see the patient in the a.m. for second opinion.  Cardiology was reconsulted and see the discussion  below.  1/13/26[name] recommended resuming heparin  drip and monitoring for 3 to 5 days and will resume Eliquis  if stable and no further GI bleeding  Assessment and Plan:  Duodenal ulcer hemorrhage with coiling 09/2023  s/p laparotomy 12/17, secondary closure 12/19 as above -Vomiting resolved since 12/26.  Off TPN on 12/31.  JP drain removed 12/31 -Tolerating mechanical soft diet.  Back on IV PPI BID -PT/OT recommending SNF when medically stable and when Hgb Stablizes he has drop in his hemoglobin and became symptomatic 02/11/2024 -02/12/24 he had some symptoms and was little lightheaded and dizzy but no further bleeding noted.   Recurrent GI Bleed and Symptomatic Anemia: Had multiple episodes of bloody and melanotic stools on 1/3 and 1/4 and now has had repeat and large volume marroon colored stool on the a.m. of 02/11/2024 assoc (images in media) -VS had remained stable but was lightheaded and dizzy today with associated drop in BP and had Tachycardia -On 02/11/2024 he was given 1 liter bolus, TED hose added.  -Hgb had been slowly dropping and hadd been oozing all week; he is having intermittent Bloody Bowel Movements but the day before was worse than his prior. Hgb/Hct Trend:  Recent Labs  Lab 02/11/24 0904 02/11/24 2157 02/12/24 1007 02/12/24 2311 02/13/24 0357 02/13/24 1127 02/13/24 1644  HGB 10.3* 9.5* 8.8* 8.8* 8.2* 8.0* 8.6*  HCT 29.8* 28.2* 26.3* 26.1* 24.2* 23.6* 25.9*  MCV 87.6 91.3 90.1  --  90.3  --   --   -General surgery re-engaged, following; hemoglobin remained stable so with GI and general surgery and feel that is okay to continue heparin  from their standpoint -02/03/24 and 02/11/24 CTA GIB negative for active bleeding -EGD (1/5) showed no active bleeding or high risk bleeding lesions and  the bleeding was suspected to be from friable mucosa in the duodenal bulb at the site of surgical repair after DOAC started -Heparin  gtt was stopped but per discussion w/ GI will resume  02/13/24 -Underwent Repeat EGD 02/11/24: Normal esophagus with erythematous mucosa in the gastric body with intact suture and erythematous friable mucosa but no evidence of recent bleeding for this area.  Given nonbleeding duodenal diverticulum and no blood or evidence of recent bleeding in the upper GI tract and is felt that the source of his new recurrent bleeding is from another source and possibly a diverticulum -Continuing IV protonix  BID with additional Pantoprazole  Bolus  -See discussion with cardiology as below -Hemoglobin continues to trend down and he was lightheaded 1/12/ with no active bleeding noted though the GI team recommended observation and output the patient to repeat endoscopic procedures or colonoscopy as he has active bleeding and patient's wife was concerned about this and wanted a second GI opinion.  The [facility] team was notified and graciously will see the patient on 02/13/2024 as the [place] team is signed off the case. [facility] recommending resuming Heparin  gtt today    Sepsis 2/2 to Above  Staph epi Bacteremia-MRSE with resistances Likely peritonitis - Bcx 12/21 postive for staph epi - Bcx 12/26 no growth - Formally consulted ID on 12/30  - Pt has completed course of antibiotics as of 12/30  - RIJ CVC removed 02/02/24 -WBC normal now and is 6.1.   AKI on admission Metabolic Acidosis - Baseline Cr 0.7 - 0.9. Most recently has been consistent 1.31-1.34. Current BUN/Cr Trend has been stable and is 28/1.35.MA has improved and now CO2 is 24, AG is 9, and Chloride Level is 104. Given his hypotension yesterday given 1 L bolus. Avoid Nephrotoxic Medications, Contrast Dyes, Hypotension and Dehydration to Ensure Adequate Renal Perfusion and will need to Renally Adjust Meds. CTM & Trend Renal Function carefully & repeat CMP in the AM   Atrial Fibrillation: CHADVASC >4. Patient's family discussed with Cardiology on 1/2, agreed to continue Eliquis . Please see cardiology consult  note from 02/02/2024.  Eliquis  was again stopped on 1/3 given episode of bloody stools/melena and given that he continued to drop his Hgb and had Symptoms Discontinued Heparin  gtt altogether.   -Cardiology was consulted and had an extensive discussion and the plan was to hold off further anticoagulation for several days and then once his GI system heals trialing heparin  again; The GI Team recommended to resume Heparin  gtt 02/13/24 -If Heparin  gtt is tolerated after 3-5 Days will be re-trialing Apixaban  to see if he tolerates that. If he has recurrent bleeding after restarting Eliquis  likely hold off anticoagulation indefinitely knowing the risks of stroke and PE -Cardiology feels that he can consider a Watchman procedure and watchman left atrial appendage occlusion device for stroke prophylaxis but will need to demonstrate tolerability of anticoagulation for at least 45 days of uninterrupted anticoagulation post watchman for 6 months of dual antiplatelet therapy followed by single antiplatelet therapy   Right Cephalic Vein Acute SVT and Right Brachial Vein Acute DVT: Noted on RUE doppler on 12/29 -Apixaban  held as above; Was holding his heparin  drip but after discussion with the new GI team they are recommending resuming Heparin  now given no active bleeding and continuing 3 to 5 days to see if he tolerates this and then resuming Eliquis  at that time   Hypernatremia: Resolved. Thought to be secondary to free water  deficit. Na+ Stable and is 136 on  last check. CTM and Trend and repeat CMP in the AM    Hypokalemia: Resolved. K+ is now 3.9 on last check. CTM and Replete as Necessary. Repeat CMP in the AM    Urinary Retention: Foley was initially removed and patient was voiding well. However, noted to be retaining again so Foley/indwelling cath placed and will be kept at discharge. Will need outpt f/u with Urology   Left Lung Mass Bronchogenic CA: Holding Immunotherapy/Chemotherapy. Will need Outpatient  follow-up   Critical illness Thrombocytopenia: Had resolved but now Platelet Count has gone to 144  Hypoalbuminemia: Albumin Lvl ranging from 3.1-3.3. CTM & Trend & repeat CMP in the AM  Class III (Morbid) Obesity: Complicates overall prognosis and care. Estimated body mass index is 40.86 kg/m as calculated from the following:   Height as of this encounter: 6' 0.01 (1.829 m).   Weight as of this encounter: 136.7 kg. Weight Loss and Dietary Counseling given

## 2024-01-28 NOTE — Progress Notes (Signed)
 PHARMACY - TOTAL PARENTERAL NUTRITION CONSULT NOTE   Indication: prolonged NPOd/t hemorrhagic perforated duodenal ulcer   Patient Measurements: Height: 6' 0.01 (182.9 cm) Weight: 127.2 kg (280 lb 6.8 oz) IBW/kg (Calculated) : 77.62 TPN AdjBW (KG): 88.1 Body mass index is 38.02 kg/m. Usual Weight: around 117-119 kg > current weight of ~135 kg likely not accurate and elevated 2/2 acute illness, IVF and edema  Assessment:  29 YOM with medical history significant for atrial fibrillation, hx stroke, COPD, lung cancer with mets, and multiple GIBs who is admitted with hematemesis after resuming apixaban  following recent admit 12/6-12/8 for melena. EGD on 12/8 showed non-bleeding duodenal ulcer with a clean ulcer base. He was transferred to [facility] for IR eval but not a candidate. He was taken to OR emergently for ex-lap duodenotomy and ligation of gastroduodenal artery. He is s/p MTP for duodenal hemorrhage requiring massive transfusion protocol, Kcentra , TXA and Factor VII. He has been NPO since surgery.  Pharmacy consulted to manage TPN.  Glucose / Insulin : A1c 5.5, BG <130, off SSI Electrolytes: Na 146 (none in TPN), K 3.9 (received IV 20 mEq IV, 184 mEq in TPN), Cl 112 (max acetate in TPN), CoCa 9.8, others wnl Renal:  SCr 1.34 (baseline 0.9), BUN 40 Hepatic: LFTs / Tbili WNL, albumin 2.9, TG 93 (12/22)  Intake / Output; MIVF:  UOP 2.7 mL/kg/hr, 12/27 LBM x3;  Net IO Since Admission: -32,618.72 mL [01/28/24 0825]  GI Imaging:  12/19 KUB: Embolization coils in upper abdomen  12/23 KUB: No bowel dilatation in the upper abdomen 12/26 UGI: No definite leakage or extravasation of contrast is noted. Large diverticulum is seen arising from third portion of duodenum GI Surgeries / Procedures:  12/17 EGD: red blood entire stomach, duodenal bulb, duodenum s/p hemostatic clip, hemospray  12/17 ex-lap with ligation of GDA and branches, VAC placement 12/19 abd wall closure, bleeding from liver capsule  controlled with cautery and surgicel snow 12/25 NGT removed  Central access: CVC 01/17/24 TPN start date: 01/21/24  Nutritional Goals: Goal TPN rate is 85 mL/hr (provides 120 g of protein and 2104 kcals per day)  RD Assessment: Estimated Needs Total Energy Estimated Needs: 2100-2300 Total Protein Estimated Needs: 115-130g Total Fluid Estimated Needs: >/=2L  Current Nutrition:  12/21 NPO and TPN 12/24 D5W @ 179ml/hr = 180g dextrose  or 612 Kcal  12/25 D5W @ 234ml/hr = 240g dextrose , 816 kcal 12/26 D5W @ 29ml/hr = 240g dextrose , 816 kcal 12/27 D5W @ 228ml/hr to end at 1400, CLD 12/28 CLD   Plan:    Continue TPN at goal 85 mL/hr at 1800, meeting 100% of estimated needs   Electrolytes in TPN: Na 0 mEq/L, decrease K 80 mEq/L (= 163 mEq/bag), Ca 3 mEq/L, Mg 10 mEq/L, Phos 15 mmol/L. Max acetate  Remove standard MVI and trace elements to TPN, give as po  Monitor TPN labs on Mon/Thurs and PRN   F/u po intake, wean TPN as able  Asked RN to have patient drink at least water  today to avoid restarting D5W for hypernatremia   Thank you for allowing pharmacy to be a part of this patients care.  Jinnie Door, PharmD, BCPS, BCCP Clinical Pharmacist  Please check AMION for all [facility] Pharmacy phone numbers After 10:00 PM, call Main Pharmacy [phone]

## 2024-01-28 NOTE — Progress Notes (Signed)
 Pt. Ambulated in hallway with walker and 1 assist, tol. Well  Raymond Hurst

## 2024-01-29 ENCOUNTER — Inpatient Hospital Stay (HOSPITAL_COMMUNITY)

## 2024-01-29 DIAGNOSIS — M7989 Other specified soft tissue disorders: Secondary | ICD-10-CM | POA: Diagnosis not present

## 2024-01-29 DIAGNOSIS — K922 Gastrointestinal hemorrhage, unspecified: Secondary | ICD-10-CM | POA: Diagnosis not present

## 2024-01-29 LAB — COMPREHENSIVE METABOLIC PANEL WITH GFR
ALT: 58 U/L — ABNORMAL HIGH (ref 0–44)
AST: 50 U/L — ABNORMAL HIGH (ref 15–41)
Albumin: 3 g/dL — ABNORMAL LOW (ref 3.5–5.0)
Alkaline Phosphatase: 92 U/L (ref 38–126)
Anion gap: 11 (ref 5–15)
BUN: 38 mg/dL — ABNORMAL HIGH (ref 8–23)
CO2: 25 mmol/L (ref 22–32)
Calcium: 9.3 mg/dL (ref 8.9–10.3)
Chloride: 109 mmol/L (ref 98–111)
Creatinine, Ser: 1.31 mg/dL — ABNORMAL HIGH (ref 0.61–1.24)
GFR, Estimated: 58 mL/min — ABNORMAL LOW
Glucose, Bld: 120 mg/dL — ABNORMAL HIGH (ref 70–99)
Potassium: 4.1 mmol/L (ref 3.5–5.1)
Sodium: 145 mmol/L (ref 135–145)
Total Bilirubin: 0.9 mg/dL (ref 0.0–1.2)
Total Protein: 6.2 g/dL — ABNORMAL LOW (ref 6.5–8.1)

## 2024-01-29 LAB — HEMOGLOBIN AND HEMATOCRIT, BLOOD
HCT: 48.8 % (ref 39.0–52.0)
HCT: 48.3 % (ref 39.0–52.0)
Hemoglobin: 15.5 g/dL (ref 13.0–17.0)
Hemoglobin: 15.3 g/dL (ref 13.0–17.0)

## 2024-01-29 LAB — TRIGLYCERIDES: Triglycerides: 103 mg/dL

## 2024-01-29 LAB — PHOSPHORUS: Phosphorus: 4.1 mg/dL (ref 2.5–4.6)

## 2024-01-29 LAB — VANCOMYCIN, TROUGH: Vancomycin Tr: 13 ug/mL — ABNORMAL LOW (ref 15–20)

## 2024-01-29 LAB — MAGNESIUM: Magnesium: 2.3 mg/dL (ref 1.7–2.4)

## 2024-01-29 MED ORDER — ORAL CARE MOUTH RINSE
15.0000 mL | Freq: Three times a day (TID) | OROMUCOSAL | Status: DC
  Administered 2024-01-29 – 2024-02-21 (×58): 15 mL via OROMUCOSAL

## 2024-01-29 MED ORDER — ENSURE PLUS HIGH PROTEIN PO LIQD
237.0000 mL | Freq: Two times a day (BID) | ORAL | Status: DC
  Administered 2024-01-29 – 2024-02-15 (×25): 237 mL via ORAL

## 2024-01-29 MED ORDER — SODIUM CHLORIDE (PF) 0.9 % IJ SOLN
INTRAMUSCULAR | Status: AC
  Administered 2024-01-29: 10 mL
  Filled 2024-01-29: qty 10

## 2024-01-29 MED ORDER — FLUCONAZOLE IN SODIUM CHLORIDE 200-0.9 MG/100ML-% IV SOLN
400.0000 mg | INTRAVENOUS | Status: DC
  Administered 2024-01-29: 400 mg via INTRAVENOUS
  Filled 2024-01-29 (×2): qty 200

## 2024-01-29 MED ORDER — TRAVASOL 10 % IV SOLN
INTRAVENOUS | Status: AC
  Filled 2024-01-29: qty 1203.6

## 2024-01-29 MED ORDER — FLUCONAZOLE IN SODIUM CHLORIDE 400-0.9 MG/200ML-% IV SOLN
400.0000 mg | INTRAVENOUS | Status: DC
  Filled 2024-01-29: qty 200

## 2024-01-29 NOTE — Progress Notes (Signed)
 Upper extremity venous duplex completed. Please see CV Procedures for preliminary results.  Initial findings reported to Rosina Loh, RN.  Garnette Rockers, RVT 01/29/2024 4:13 PM

## 2024-01-29 NOTE — Progress Notes (Signed)
 Pharmacy Antibiotic Note  Raymond Hurst is a 73 y.o. male admitted on 01/16/2024 with hemorrhagic shock/GIB s/p MTP, reversal agents and blood products.  Patient has been on Zosyn  and fluconazole  for intra-abdominal coverage, now with MRSE in 3/3 blood culture bottles.  Pharmacy has been consulted to add vancomycin .  Vanc peak = 32, Vanc trough = 13, calculated AUC = 523 which is within goal 400-550  Plan: Continue vancomycin  1500mg  IV q24h - recheck levels as needed Zosyn  EID 3.375gm IV Q8H Fluc 400mg  IV Q24H Monitor renal fxn, micro data, clinical progress, LOT  Height: 6' 0.01 (182.9 cm) Weight: (!) 136.7 kg (301 lb 5.9 oz) IBW/kg (Calculated) : 77.62  Temp (24hrs), Avg:97.8 F (36.6 C), Min:97.6 F (36.4 C), Max:97.9 F (36.6 C)  Recent Labs  Lab 01/23/24 0320 01/24/24 0445 01/25/24 0435 01/26/24 1015 01/27/24 0316 01/28/24 0355 01/28/24 2052 01/29/24 0330 01/29/24 1740  WBC 9.2  --   --   --   --   --   --   --   --   CREATININE 1.47*   < > 1.43* 1.43* 1.33* 1.34*  --  1.31*  --   VANCOTROUGH  --   --   --   --   --   --   --   --  13*  VANCOPEAK  --   --   --   --   --   --  32  --   --    < > = values in this interval not displayed.    Estimated Creatinine Clearance: 74 mL/min (A) (by C-G formula based on SCr of 1.31 mg/dL (H)).    Allergies[1]   Zosyn  12/17 >> Fluc 12/17 >> Vanc 12/24 >>   12/16 MRS PCR - negative 12/21 BCx - BCID 3/3 MRSE 12/26 Repeat Bcx: NG2D   Rocky Slade, PharmD, BCPS 01/29/2024, 6:35 PM      [1] No Known Allergies

## 2024-01-29 NOTE — Progress Notes (Signed)
 Physical Therapy Treatment Patient Details Name: Raymond Hurst MRN: [id number] DOB: 01-22-1951 Today's Date: 01/29/2024   History of Present Illness Pt is 73 yo initially presenting to [facility] then transferring to [facility] on 12/16 due to dizziness hematemesis. 12/17 pt with continuing ongoing blood loss anemia without recurrent hematemesis and was given 2 U PRBC. EGD with findings of duodenal active bleed which was clipped. Pt was intubated and admitted to ICU on 12/17 then transferred to [facility] 12/19 ex lap abdominal wall closure with some bleeding from liver capsule controlled with cautery.  PMH includes afib, gout, GI bleed, stage IV lung cancer, alcohol abuse.    PT Comments  Pt admitted with above diagnosis. Pt was able to ambulate with RW a short distance with min assist with poor postural stability. Pt needed total assist for pericare. Pt fatigues quickly and needs continues post acute rehab < 3 hours day.  Pt currently with functional limitations due to the deficits listed below (see PT Problem List). Pt will benefit from acute skilled PT to increase their independence and safety with mobility to allow discharge.       If plan is discharge home, recommend the following: Assist for transportation;Assistance with cooking/housework   Can travel by private vehicle        Equipment Recommendations  BSC/3in1;Wheelchair (measurements PT);Wheelchair cushion (measurements PT);Hospital bed    Recommendations for Other Services       Precautions / Restrictions Precautions Precautions: Fall Recall of Precautions/Restrictions: Impaired Restrictions Weight Bearing Restrictions Per Provider Order: No     Mobility  Bed Mobility Overal bed mobility: Needs Assistance Bed Mobility: Supine to Sit     Supine to sit: Min assist     General bed mobility comments: Min assist to roll R. Pt stated he needed to use bathroom therefore assist to side of bed and plan to get on 3N1.    Transfers Overall  transfer level: Needs assistance Equipment used: Rolling walker (2 wheels) Transfers: Sit to/from Stand, Bed to chair/wheelchair/BSC Sit to Stand: Min assist, From elevated surface Stand pivot transfers: Min assist         General transfer comment: Pt needed min assist to rise from elevated bed and cues fhr hand placement. Pt pivoted to 3N1.  Had BM. Needed total assist to be cleaned. Pt then ambulated to recliner with RW.    Ambulation/Gait Ambulation/Gait assistance: Contact guard assist Gait Distance (Feet): 4 Feet Assistive device: Rolling walker (2 wheels) Gait Pattern/deviations: Step-through pattern, Decreased stance time - left, Drifts right/left, Trunk flexed, Wide base of support   Gait velocity interpretation: <1.31 ft/sec, indicative of household ambulator   General Gait Details: Pt stood about 2 min to be cleaned as the bed was soiled once he stood and he used 3N1 therefore needed incr assist to clean pt.  Pt fatigued therefore only able to ambulate with RW a few feet to get to chair with poor upright posture overall.  Was able to steer RW and needed cues for proximity to RW.   Stairs             Wheelchair Mobility     Tilt Bed    Modified Rankin (Stroke Patients Only)       Balance Overall balance assessment: Needs assistance Sitting-balance support: No upper extremity supported, Feet supported Sitting balance-Leahy Scale: Fair     Standing balance support: Bilateral upper extremity supported, During functional activity, Reliant on assistive device for balance Standing balance-Leahy Scale: Poor Standing balance comment:  Min A for standing balance with bil UE support.                            Communication Communication Communication: Impaired Factors Affecting Communication: Hearing impaired  Cognition Arousal: Alert Behavior During Therapy: WFL for tasks assessed/performed   PT - Cognitive impairments: No family/caregiver present  to determine baseline, Safety/Judgement, Sequencing                         Following commands: Impaired      Cueing Cueing Techniques: Verbal cues, Tactile cues, Visual cues, Gestural cues  Exercises General Exercises - Lower Extremity Ankle Circles/Pumps: AROM, Both, 10 reps, Seated Long Arc Quad: AROM, Both, 10 reps, Seated    General Comments General comments (skin integrity, edema, etc.): VSS      Pertinent Vitals/Pain Pain Assessment Pain Assessment: Faces Faces Pain Scale: Hurts even more Pain Location: abdomen at surgical site, worse with movement Pain Descriptors / Indicators: Aching, Discomfort, Guarding, Grimacing Pain Intervention(s): Limited activity within patient's tolerance, Monitored during session, Repositioned    Home Living                          Prior Function            PT Goals (current goals can now be found in the care plan section) Acute Rehab PT Goals Patient Stated Goal: to improve mobiltiy Progress towards PT goals: Progressing toward goals    Frequency    Min 2X/week      PT Plan      Co-evaluation              AM-PAC PT 6 Clicks Mobility   Outcome Measure  Help needed turning from your back to your side while in a flat bed without using bedrails?: A Lot Help needed moving from lying on your back to sitting on the side of a flat bed without using bedrails?: A Lot Help needed moving to and from a bed to a chair (including a wheelchair)?: A Lot Help needed standing up from a chair using your arms (e.g., wheelchair or bedside chair)?: A Lot Help needed to walk in hospital room?: Total Help needed climbing 3-5 steps with a railing? : Total 6 Click Score: 10    End of Session Equipment Utilized During Treatment: Gait belt Activity Tolerance: Patient tolerated treatment well Patient left: with call bell/phone within reach;in chair;with chair alarm set Nurse Communication: Mobility status PT Visit  Diagnosis: Unsteadiness on feet (R26.81);Other abnormalities of gait and mobility (R26.89);Muscle weakness (generalized) (M62.81)     Time: 8748-8678 PT Time Calculation (min) (ACUTE ONLY): 30 min  Charges:    $Gait Training: 8-22 mins $Therapeutic Activity: 8-22 mins PT General Charges $$ ACUTE PT VISIT: 1 Visit                     Smita Lesh M,PT Acute Rehab Services [phone]    Raymond Hurst 01/29/2024, 4:13 PM

## 2024-01-29 NOTE — Plan of Care (Signed)
 OOB to chair twice. Walks a few steps in room. Tolerating a full liquid diet. + diarrhea  Problem: Clinical Measurements: Goal: Ability to maintain clinical measurements within normal limits will improve Outcome: Progressing Goal: Will remain free from infection Outcome: Progressing Goal: Diagnostic test results will improve Outcome: Progressing Goal: Respiratory complications will improve Outcome: Progressing Goal: Cardiovascular complication will be avoided Outcome: Progressing   Problem: Activity: Goal: Risk for activity intolerance will decrease Outcome: Progressing   Problem: Nutrition: Goal: Adequate nutrition will be maintained Outcome: Progressing

## 2024-01-29 NOTE — Progress Notes (Signed)
 10 Days Post-Op   Subjective/Chief Complaint: No new complaints Tolerating full liquids Having small bowel movements Hgb stable   Objective: Vital signs in last 24 hours: Temp:  [97.6 F (36.4 C)-97.9 F (36.6 C)] 97.6 F (36.4 C) (12/29 0837) Pulse Rate:  [83-97] 96 (12/29 0837) Resp:  [18-19] 19 (12/29 0837) BP: (126-146)/(77-99) 126/77 (12/29 0837) SpO2:  [95 %-98 %] 96 % (12/29 0837) Weight:  [136.7 kg] 136.7 kg (12/29 0406) Last BM Date : 01/29/24  Intake/Output from previous day: 12/28 0701 - 12/29 0700 In: 1858.1 [I.V.:1382.5; IV Piggyback:475.7] Out: 4185 [Urine:4185] Intake/Output this shift: Total I/O In: 730 [P.O.:730] Out: 700 [Urine:700]  WDWN in NAD Abd - soft, non-tender Midline wound with clean granulation tissue JP - old dark blood   Lab Results:  Recent Labs    01/28/24 1859 01/29/24 0330  HGB 16.6 15.5  HCT 51.7 48.8   BMET Recent Labs    01/28/24 0355 01/29/24 0330  NA 146* 145  K 3.9 4.1  CL 112* 109  CO2 25 25  GLUCOSE 116* 120*  BUN 40* 38*  CREATININE 1.34* 1.31*  CALCIUM  8.9 9.3     Anti-infectives: Anti-infectives (From admission, onward)    Start     Dose/Rate Route Frequency Ordered Stop   01/25/24 1500  vancomycin  (VANCOREADY) IVPB 1500 mg/300 mL        1,500 mg 150 mL/hr over 120 Minutes Intravenous Every 24 hours 01/24/24 1322     01/24/24 1115  vancomycin  (VANCOCIN ) 2,500 mg in sodium chloride  0.9 % 500 mL IVPB        2,500 mg 262.5 mL/hr over 120 Minutes Intravenous  Once 01/24/24 1029 01/24/24 1530   01/17/24 2245  piperacillin -tazobactam (ZOSYN ) IVPB 3.375 g        3.375 g 12.5 mL/hr over 240 Minutes Intravenous Every 8 hours 01/17/24 2149     01/17/24 2200  fluconazole  (DIFLUCAN ) IVPB 400 mg        400 mg 100 mL/hr over 120 Minutes Intravenous Every 24 hours 01/17/24 2059         Assessment/Plan: Duodenal ulcer hemorrhage  PMH EGD and IR coil embolization of gastric ulcer in August 2025 Presented  to [facility] 12/16 w/ hematemesis, EGD 12/17 showed bleed from duodenal ulcer, clips placed Transferred to [facility] for critical care services and IR consult Arrived in hemorrhagic shock, critical, MTP started, taken emergently to the OR for below:    POD 12 S/P LAPAROTOMY, EXPLORATORY, Duodenotomy, Ligation of GDA and branches, ABThera wound VAC placement 12/17 Dr. Rubin  POD 10 S/P exp lap, abdominal wall closure 12/19 Dr. Ebbie; some bleeding from liver capsule controlled with cautery and surgicel snow.    - UGI 12/26 w/o evidence of leak, JP output stable - Tol FLD, adv to soft diet - Surgery will follow - consider removing drain prior to discharge   Per CCM: Hemorrhagic shock  Metastatic lung CA VDRF COPD OSA AKI  PMH CVA PMH afib on eliquis  - DOAC held in the setting of acute bleeding   LOS: 12 days    Donnice MARLA Lima 01/29/2024

## 2024-01-29 NOTE — TOC Progression Note (Addendum)
 Transition of Care [facility]) - Progression Note    Patient Details  Name: Raymond Hurst MRN: [id number] Date of Birth: 03-Nov-1950  Transition of Care [facility]) CM/SW Contact  Lendia Dais, [place] Phone Number: 01/29/2024, 3:32 PM  Clinical Narrative: CSW spoke to pt's spouse Raymond Hurst via phone who stated a preference of [facility]. CSW spoke to [place] of [facility] via phone who stated they are going through referrals now and will follow up with a response.  1655 - CSW received a VM from Raymond Hurst who stated they wanted to move forward with [facility]. CSW informed Mar[facility] of [facility].  CSW will continue to follow.        Barriers to Discharge: Continued Medical Work up               Expected Discharge Plan and Services                                               Social Drivers of Health (SDOH) Interventions SDOH Screenings   Food Insecurity: No Food Insecurity (01/16/2024)  Housing: Low Risk (01/26/2024)  Transportation Needs: No Transportation Needs (01/16/2024)  Utilities: Not At Risk (01/16/2024)  Alcohol Screen: Low Risk (06/15/2023)  Depression (PHQ2-9): Low Risk (01/12/2024)  Financial Resource Strain: High Risk (06/15/2023)  Physical Activity: Inactive (06/15/2023)  Social Connections: Moderately Isolated (01/16/2024)  Stress: No Stress Concern Present (06/15/2023)  Tobacco Use: Medium Risk (01/19/2024)    Readmission Risk Interventions    09/08/2023   12:09 PM  Readmission Risk Prevention Plan  Transportation Screening Complete  HRI or Home Care Consult Complete  Social Work Consult for Recovery Care Planning/Counseling Complete  Palliative Care Screening Not Applicable  Medication Review [employer]) Complete

## 2024-01-29 NOTE — Progress Notes (Signed)
 " Progress Note   Patient: Raymond Hurst FMW:[id number] DOB: Jul 05, 1950 DOA: 01/16/2024     12 DOS: the patient was seen and examined on 01/29/2024   Brief hospital course: 73 year old male Left lung mass bronchogenic CA (poorly differentiated squamous cell) diagnosed 06/05/2023-bronchoscopy 08/09/2023 complicated by left PNA thorax-follows with Dr. Arlester carboplatin  and Taxol --currently durvalumab  Permanent A-fib CHA2DS2-VASc >4 on Eliquis  Previous heavy EtOH UGIB 09/09/2023 cratered ulcer duodenum status post mesenteric angio/embolization @ [facility] 12/6-12/8 readmission--with upper GI bleed clean-based ulcer told to continue twice daily PPI manage resumed Eliquis  hemoglobin was 9.6 on discharge   Events  12/16 [facility] ED eval dizziness nausea-couple of bright red vomit-->?  ICU [facility]--> [address] [place] transfer 2/2 12/17 ICU [facility]-->  [facility] 2/2--IR duodenal bleed Clip ->?  Continued ongoing bleeding requiring pressors--- massive transfusion protocol-----emergent ex lap Dr. Rubin with duodenotomy ligation GDA branches VAC placement 12/19 Secondary closure of abdomen Dr. Ebbie  12/26 barium swallow performed  Assessment and Plan:   Duodenal ulcer hemorrhage with coiling 09/2023 Laparotomy 12/17, secondary closure 12/19 as above Continues TPN, begin weaning as of 12/30 Vomiting resolved from 12/26 Diet being advanced by gen surg, now on full liquids   Sepsis secondary to above  Staph epi bacteremia-MRSE with resistances -likely peritonitis Remains on vancomycin --likely 10 to 14-day duration total treatment-ID formally consulted 12/26 Peritonitis          continues fluconazole  Zosyn  for peritonitis Blood cultures last performed 12/21 -repeat cx on 12/26 neg  Require central line for now given multiple infusion   Hypernatremia Resolved   AKI on admission--- resolving well-see above as well as fluid status Hypokalemia Resolved Foley removed. Voiding trial  in progress, has needed I/O cath yesterday afternoon Cont bladder scan. If pt continues needing I/o cath, then would recommend indwelling cath and outpt f/u   Left lung mass bronchogenic CA Holding immunotherapy/chemotherapy Outpatient follow-up   Critical illness thrombocytopenia Just trend at this time-no schistocytes on smear review 12/18-improving slowly   Atrial fibrillation CHADVASC >4 previously on Eliquis  Anticoagulation held-likely needs discussion of the same going forward R Arm swelling secondary to R cephalic vein and chronic DVT of R brachial vein Noted on RUE doppler Will need to discuss with surgical team regarding anticoagulation given presenting dudodenal ulcer hemorrhage and laparotomy      Subjective: States arm swelling and mobility are improved  Physical Exam: Vitals:   01/29/24 0825 01/29/24 0837 01/29/24 0956 01/29/24 1656  BP:  126/77 119/78 113/63  Pulse:  96 90 79  Resp:  19  18  Temp:  97.6 F (36.4 C)  97.7 F (36.5 C)  TempSrc:      SpO2: 97% 96% 96% 93%  Weight:      Height:       General exam: Awake, laying in bed, in nad Respiratory system: Normal respiratory effort, no wheezing Cardiovascular system: regular rate, s1, s2 Gastrointestinal system: Soft, nondistended, positive BS Central nervous system: CN2-12 grossly intact, strength intact Extremities: Perfused, no clubbing, R arm less swollen Skin: Normal skin turgor, no notable skin lesions seen Psychiatry: Mood normal // no visual hallucinations   Data Reviewed:  Labs reviewed: Na 145, K 4.1, Cr 1.31, Hgb 15.3   Family Communication: Pt in room, family currently not at bedside  Disposition: Status is: Inpatient Remains inpatient appropriate because: severity of illness  Planned Discharge Destination: Skilled nursing facility    Author: Garnette Pelt, MD 01/29/2024 5:38 PM  For on call review [email or web address]  "

## 2024-01-29 NOTE — Progress Notes (Signed)
 Nutrition Follow-up  DOCUMENTATION CODES:  Non-severe (moderate) malnutrition in context of chronic illness  INTERVENTION:  Wean TPN now that pt is eating well and tolerating PO diet Will receive 100% of needs today via TPN Begin to wean tomorrow  Encouraged adequate PO intake to meet calorie and protein needs Added Magic cup TID with meals, each supplement provides 290 kcal and 9 grams of protein Continue Ensure Plus High Protein po BID, each supplement provides 350 kcal and 20 grams of protein. Added strawberry Mighty Shakes TID if pt refuses Ensures  Tolerating full liquids, surgery recommends advancement to soft diet  NUTRITION DIAGNOSIS:  Moderate Malnutrition related to chronic illness (COPD, CVA, GIB, NSCLC) as evidenced by moderate fat depletion, severe muscle depletion, edema. - diagnosis updated 12/22  GOAL:  Patient will meet greater than or equal to 90% of their needs Progressing  MONITOR:  Vent status, Labs, Weight trends, I & O's, Skin  REASON FOR ASSESSMENT:  Ventilator    ASSESSMENT:  Pt admitted with hematemesis r/t duodenal hemorrhage. PMH significant for COPD, afib on Eliquis , CVA, history of GI bleed d/t gastric ulcer, chronic lymphedema, non small cell lung cancer on monthly chemo. Recently admitted 12/6-12/8 with bloody stools r/t clean-based gastric ulcer without signs of active bleeding.  12/16 - admitted w/hematemesis 12/17- upper GI endoscopy- normal esophagus, red blood in entire stomach, blood in duodenal bulb and second portion of the duodenum, s/p clipping; remains intubated; exlap, duodenotomy, ligation of GDA and branches, ABThera wound VAC placement 12/19 - return to OR- reopening of laparotomy; abdominal wall closure  12/21 - TPN initiation 12/23- extubated 12/25- NG removed 12/26- UGI shows no leak, vomiting resolved 12/27- advanced to CLD 12/28- advanced to FLD  Pt tolerating full liquids well, reports eating 100% of trays over the last  2 days since being upgraded to fulls. Pt also acceptable of Ensure shakes and will continue those. Added magic cups and strawberry Mighty Shakes to trays in case pt refuses Ensures. Pt reports no issues with n/v at this time and no abdominal pain. Surgery approves advancement to soft diet. Suspect pt will be able to meet calorie and protein needs as diet continues to advance, recommend weaning TPN. Pt will receive full TPN today and will wean starting tomorrow. Encouraged pt to continue to eat well and will continue to monitor intake and tolerance.    Average Meal Completion: 12/28-12/29:100% average intake x 5  recorded meals (3 not recorded but reported by pt)  Nutritionally Relevant Medications: MVI w/ minerals Protonix  IV Zosyn   Vancomycin    Labs reviewed: Sodium 145<--146 Chloride 109<--112 AST 50 ALT 58  Weight Trends: 12/19 141.3 kg 12/21 139.4 kg 12/22 135.7 kg 12/25 127.2 kg 12/29 136.7 kg  **-33 L net since admission**  Diet Order:   Diet Order             Diet full liquid Room service appropriate? Yes; Fluid consistency: Thin  Diet effective now                   EDUCATION NEEDS:  No education needs have been identified at this time  Skin:  Skin Assessment: Skin Integrity Issues: Skin Integrity Issues:: Wound VAC Wound Vac: removed  Last BM:  12/29 type 7  Height:  Ht Readings from Last 1 Encounters:  01/21/24 6' 0.01 (1.829 m)    Weight:  Wt Readings from Last 1 Encounters:  01/29/24 (!) 136.7 kg    Ideal Body Weight:  80.9 kg  BMI:  Body mass index is 40.86 kg/m.  Estimated Nutritional Needs:   Kcal:  2100-2300  Protein:  115-130g  Fluid:  >/=2L   Josette Glance, MS, RDN, LDN Clinical Dietitian I Please reach out via secure chat

## 2024-01-29 NOTE — Progress Notes (Signed)
 PHARMACY - TOTAL PARENTERAL NUTRITION CONSULT NOTE   Indication: prolonged NPOd/t hemorrhagic perforated duodenal ulcer   Patient Measurements: Height: 6' 0.01 (182.9 cm) Weight: (!) 136.7 kg (301 lb 5.9 oz) IBW/kg (Calculated) : 77.62 TPN AdjBW (KG): 88.1 Body mass index is 40.86 kg/m. Usual Weight: around 117-119 kg > current weight of ~135 kg likely not accurate and elevated 2/2 acute illness, IVF and edema  Assessment:  39 YOM with medical history significant for atrial fibrillation, hx stroke, COPD, lung cancer with mets, and multiple GIBs who is admitted with hematemesis after resuming apixaban  following recent admit 12/6-12/8 for melena. EGD on 12/8 showed non-bleeding duodenal ulcer with a clean ulcer base. He was transferred to [facility] for IR eval but not a candidate. He was taken to OR emergently for ex-lap duodenotomy and ligation of gastroduodenal artery. He is s/p MTP for duodenal hemorrhage requiring massive transfusion protocol, Kcentra , TXA and Factor VII. He has been NPO since surgery.  Pharmacy consulted to manage TPN.  Glucose / Insulin : A1c 5.5, BG <130, off SSI Electrolytes: Na 145 (none in TPN), K 4.1 (163 mEq in TPN), Cl 109 (max acetate in TPN), CoCa 9.8, others wnl Renal:  SCr 1.31 (baseline 0.9), BUN 38 Hepatic: LFTs / Tbili WNL, albumin 2.9, TG 103 (12/29)  Intake / Output; MIVF:  UOP 1.3 mL/kg/hr, 12/28 LBM x2;  Net IO Since Admission: -34,945.58 mL [01/29/24 0827]  GI Imaging:  12/19 KUB: Embolization coils in upper abdomen  12/23 KUB: No bowel dilatation in the upper abdomen 12/26 UGI: No definite leakage or extravasation of contrast is noted. Large diverticulum is seen arising from third portion of duodenum GI Surgeries / Procedures:  12/17 EGD: red blood entire stomach, duodenal bulb, duodenum s/p hemostatic clip, hemospray  12/17 ex-lap with ligation of GDA and branches, VAC placement 12/19 abd wall closure, bleeding from liver capsule controlled with  cautery and surgicel snow 12/25 NGT removed  Central access: CVC 01/17/24 TPN start date: 01/21/24  Nutritional Goals: Goal TPN rate is 85 mL/hr (provides 120 g of protein and 2104 kcals per day)  RD Assessment: Estimated Needs Total Energy Estimated Needs: 2100-2300 Total Protein Estimated Needs: 115-130g Total Fluid Estimated Needs: >/=2L  Current Nutrition:  12/21 NPO and TPN 12/24 D5W @ 174ml/hr = 180g dextrose  or 612 Kcal  12/25 D5W @ 265ml/hr = 240g dextrose , 816 kcal 12/26 D5W @ 272ml/hr = 240g dextrose , 816 kcal 12/27 D5W @ 241ml/hr to end at 1400, CLD 12/28 CLD -> FLD - spoke to dietician about adding nutritional supplements and perhaps starting a calorie count to see his intake  Plan:    Continue TPN at goal 85 mL/hr at 1800, meeting 100% of estimated needs   Electrolytes in TPN: Na 0 mEq/L, K 80 mEq/L (= 163 mEq/bag), Ca 3 mEq/L, decrease Mg 8 mEq/L, Phos 15 mmol/L. Max acetate  Remove standard MVI and trace elements to TPN, give as po  Monitor TPN labs on Mon/Thurs and PRN   F/u po intake, wean TPN as able  Asked RN to encourage patient to drink at least water  to avoid restarting D5W for hypernatremia   Thank you for allowing pharmacy to be a part of this patients care.  Donny Alert, PharmD, [facility] Clinical Pharmacist Please see AMION for all Pharmacists' Contact Phone Numbers 01/29/2024, 8:32 AM

## 2024-01-30 ENCOUNTER — Telehealth: Payer: Self-pay | Admitting: Internal Medicine

## 2024-01-30 ENCOUNTER — Telehealth: Payer: Self-pay | Admitting: Student

## 2024-01-30 DIAGNOSIS — A419 Sepsis, unspecified organism: Secondary | ICD-10-CM

## 2024-01-30 DIAGNOSIS — K264 Chronic or unspecified duodenal ulcer with hemorrhage: Secondary | ICD-10-CM

## 2024-01-30 LAB — CBC
HCT: 46.1 % (ref 39.0–52.0)
Hemoglobin: 15.1 g/dL (ref 13.0–17.0)
MCH: 29.7 pg (ref 26.0–34.0)
MCHC: 32.8 g/dL (ref 30.0–36.0)
MCV: 90.7 fL (ref 80.0–100.0)
Platelets: 152 K/uL (ref 150–400)
RBC: 5.08 MIL/uL (ref 4.22–5.81)
RDW: 15.8 % — ABNORMAL HIGH (ref 11.5–15.5)
WBC: 8.7 K/uL (ref 4.0–10.5)
nRBC: 0 % (ref 0.0–0.2)

## 2024-01-30 LAB — HEPARIN LEVEL (UNFRACTIONATED): Heparin Unfractionated: 0.11 [IU]/mL — ABNORMAL LOW (ref 0.30–0.70)

## 2024-01-30 LAB — APTT: aPTT: 50 s — ABNORMAL HIGH (ref 24–36)

## 2024-01-30 MED ORDER — TRAVASOL 10 % IV SOLN
INTRAVENOUS | Status: AC
  Filled 2024-01-30: qty 637.2

## 2024-01-30 MED ORDER — HEPARIN (PORCINE) 25000 UT/250ML-% IV SOLN
2350.0000 [IU]/h | INTRAVENOUS | Status: DC
  Administered 2024-01-30: 1600 [IU]/h via INTRAVENOUS
  Administered 2024-01-31: 2100 [IU]/h via INTRAVENOUS
  Administered 2024-01-31: 1900 [IU]/h via INTRAVENOUS
  Administered 2024-02-01: 2350 [IU]/h via INTRAVENOUS
  Filled 2024-01-30 (×4): qty 250

## 2024-01-30 MED ORDER — HEPARIN BOLUS VIA INFUSION
3000.0000 [IU] | Freq: Once | INTRAVENOUS | Status: AC
  Administered 2024-01-30: 3000 [IU] via INTRAVENOUS
  Filled 2024-01-30: qty 3000

## 2024-01-30 NOTE — Plan of Care (Incomplete)

## 2024-01-30 NOTE — Telephone Encounter (Signed)
 I reassured wife that because patient had hemorrhagic shock on admission and he would not be put back on eliquis . She will f/u with cardiology after hospitalization for a-fib management going forward.

## 2024-01-30 NOTE — Progress Notes (Addendum)
" °  Progress Note   Patient: Raymond Hurst FMW:[id number] DOB: 1950/05/27 DOA: 01/16/2024     13 DOS: the patient was seen and examined on 01/30/2024   Brief hospital course: 73 year old male Left lung mass bronchogenic CA (poorly differentiated squamous cell) diagnosed 06/05/2023-bronchoscopy 08/09/2023 complicated by left PNA thorax-follows with Dr. Arlester carboplatin  and Taxol --currently durvalumab  Permanent A-fib CHA2DS2-VASc >4 on Eliquis  Previous heavy EtOH UGIB 09/09/2023 cratered ulcer duodenum status post mesenteric angio/embolization @ [facility] 12/6-12/8 readmission--with upper GI bleed clean-based ulcer told to continue twice daily PPI manage resumed Eliquis  hemoglobin was 9.6 on discharge   Events  12/16 [facility] ED eval dizziness nausea-couple of bright red vomit-->?  ICU [facility]--> [address] [place] transfer 2/2 12/17 ICU [facility]-->  [facility] 2/2--IR duodenal bleed Clip ->?  Continued ongoing bleeding requiring pressors--- massive transfusion protocol-----emergent ex lap Dr. Rubin with duodenotomy ligation GDA branches VAC placement 12/19 Secondary closure of abdomen Dr. Ebbie  12/26 barium swallow performed  Assessment and Plan:   Duodenal ulcer hemorrhage with coiling 09/2023 Laparotomy 12/17, secondary closure 12/19 as above Continues TPN, begin weaning as of 12/30 Vomiting resolved from 12/26 Diet being advanced by gen surg, now on full liquids   Sepsis secondary to above  Staph epi bacteremia-MRSE with resistances -likely peritonitis Formally consulted ID today (12/30) Pt has completed all abx. Stop all antimicrobials as of today Remove CVL once pt is weaned off TPN   Hypernatremia Resolved   AKI on admission--- resolving well-see above as well as fluid status Hypokalemia Resolved Foley removed and pt voiding well Cont bladder scan as needed. If pt continues needing I/o cath, then would recommend indwelling cath and outpt f/u   Left lung mass  bronchogenic CA Holding immunotherapy/chemotherapy Outpatient follow-up   Critical illness thrombocytopenia Just trend at this time-no schistocytes on smear review 12/18-improving slowly   Atrial fibrillation CHADVASC >4 previously on Eliquis  OK for DOAC per General Surgery, discussed with Dr. Belinda R Arm swelling secondary to R cephalic vein and chronic DVT of R brachial vein Noted on RUE doppler OK to anticoagulate per General Surgery, Dr. Belinda Heparin  gtt was ordered      Subjective: Reports R arm has improved ROM and swelling has improved  Physical Exam: Vitals:   01/29/24 1955 01/30/24 0441 01/30/24 0742 01/30/24 1638  BP:  123/82 (!) 154/91 130/85  Pulse:  79 83 89  Resp:  20 18 18   Temp:  97.8 F (36.6 C)  (!) 97.4 F (36.3 C)  TempSrc:    Oral  SpO2: 96% 96% 99% 96%  Weight:      Height:       General exam: Conversant, in no acute distress Respiratory system: normal chest rise, clear, no audible wheezing Cardiovascular system: regular rhythm, s1-s2 Gastrointestinal system: Nondistended, nontender, pos BS Central nervous system: No seizures, no tremors Extremities: No cyanosis, no joint deformities Skin: No rashes, no pallor Psychiatry: Affect normal // no auditory hallucinations   Data Reviewed:  Labs reviewed: WBC 8.7, Hgb 15.1, Plts 152   Family Communication: Pt in room, family currently not at bedside  Disposition: Status is: Inpatient Remains inpatient appropriate because: severity of illness  Planned Discharge Destination: Skilled nursing facility    Author: Garnette Pelt, MD 01/30/2024 6:11 PM  For on call review [email or web address]  "

## 2024-01-30 NOTE — Progress Notes (Addendum)
 11 Days Post-Op   Subjective/Chief Complaint: Had several more BM yesterday No new complaints Hgb stable   Objective: Vital signs in last 24 hours: Temp:  [97.7 F (36.5 C)-98.2 F (36.8 C)] 97.8 F (36.6 C) (12/30 0441) Pulse Rate:  [75-90] 83 (12/30 0742) Resp:  [18-20] 18 (12/30 0742) BP: (113-154)/(63-91) 154/91 (12/30 0742) SpO2:  [93 %-99 %] 99 % (12/30 0742) Last BM Date : 01/29/24  Intake/Output from previous day: 12/29 0701 - 12/30 0700 In: 5157.7 [P.O.:1660; I.V.:2412.9; IV Piggyback:1084.7] Out: 4435 [Urine:4425; Drains:10] Intake/Output this shift: No intake/output data recorded.  WDWN in NAD Abd - soft, non-tender Midline wound with clean granulation tissue JP - old dark blood - only 10 cc output  Lab Results:  Recent Labs    01/29/24 1628 01/30/24 0323  WBC  --  8.7  HGB 15.3 15.1  HCT 48.3 46.1  PLT  --  152   BMET Recent Labs    01/28/24 0355 01/29/24 0330  NA 146* 145  K 3.9 4.1  CL 112* 109  CO2 25 25  GLUCOSE 116* 120*  BUN 40* 38*  CREATININE 1.34* 1.31*  CALCIUM  8.9 9.3   PT/INR No results for input(s): LABPROT, INR in the last 72 hours. ABG No results for input(s): PHART, HCO3 in the last 72 hours.  Invalid input(s): PCO2, PO2  Studies/Results: VAS US  UPPER EXTREMITY VENOUS DUPLEX Result Date: 01/29/2024 UPPER VENOUS STUDY  Patient Name:  Raymond Hurst  Date of Exam:   01/29/2024 Medical Rec #: [id number]       Accession #:    [id number] Date of Birth: 03/30/1950       Patient Gender: M Patient Age:   73 years Exam Location:  [facility] Procedure:      VAS US  UPPER EXTREMITY VENOUS DUPLEX Referring Phys: STEPHEN CHIU --------------------------------------------------------------------------------  Indications: Swelling Risk Factors: Cancer Lung. Limitations: R IJV Central Line. Performing Technologist: Garnette Rockers  Examination Guidelines: A complete evaluation includes B-mode imaging, spectral Doppler, color  Doppler, and power Doppler as needed of all accessible portions of each vessel. Bilateral testing is considered an integral part of a complete examination. Limited examinations for reoccurring indications may be performed as noted.  Right Findings: +----------+------------+---------+-----------+------------------+----------+ RIGHT     CompressiblePhasicitySpontaneous    Properties     Summary   +----------+------------+---------+-----------+------------------+----------+ IJV                                                         Obstructed +----------+------------+---------+-----------+------------------+----------+ Subclavian    Full       Yes       Yes                                 +----------+------------+---------+-----------+------------------+----------+ Axillary      Full       Yes       Yes                                 +----------+------------+---------+-----------+------------------+----------+ Brachial      None       No        No         retracted  Chronic   +----------+------------+---------+-----------+------------------+----------+ Radial        Full       Yes       Yes                                 +----------+------------+---------+-----------+------------------+----------+ Ulnar         Full       Yes       Yes                                 +----------+------------+---------+-----------+------------------+----------+ Cephalic      None       No        No     brightly echogenic  Acute    +----------+------------+---------+-----------+------------------+----------+ Basilic       Full                 Yes                                 +----------+------------+---------+-----------+------------------+----------+ Occlusive Chronic DVT seen in the distal portion of 1/2 Brachial Veins. Occlusive SVT seen through the full length of the Cephalic Vein.  Left Findings:  +----------+------------+---------+-----------+----------+-------+ LEFT      CompressiblePhasicitySpontaneousPropertiesSummary +----------+------------+---------+-----------+----------+-------+ Subclavian               Yes       Yes                      +----------+------------+---------+-----------+----------+-------+  Summary:  Right: Findings consistent with acute superficial vein thrombosis involving the right cephalic vein. Findings consistent with chronic deep vein thrombosis involving the right brachial veins.  Left: No evidence of thrombosis in the subclavian.  *See table(s) above for measurements and observations.    Preliminary     Anti-infectives: Anti-infectives (From admission, onward)    Start     Dose/Rate Route Frequency Ordered Stop   01/29/24 2200  fluconazole  (DIFLUCAN ) IVPB 400 mg  Status:  Discontinued        400 mg 100 mL/hr over 120 Minutes Intravenous Every 24 hours 01/29/24 1336 01/29/24 1346   01/29/24 2200  fluconazole  (DIFLUCAN ) IVPB 400 mg        400 mg 200 mL/hr over 60 Minutes Intravenous Every 24 hours 01/29/24 1346     01/25/24 1500  vancomycin  (VANCOREADY) IVPB 1500 mg/300 mL        1,500 mg 150 mL/hr over 120 Minutes Intravenous Every 24 hours 01/24/24 1322     01/24/24 1115  vancomycin  (VANCOCIN ) 2,500 mg in sodium chloride  0.9 % 500 mL IVPB        2,500 mg 262.5 mL/hr over 120 Minutes Intravenous  Once 01/24/24 1029 01/24/24 1530   01/17/24 2245  piperacillin -tazobactam (ZOSYN ) IVPB 3.375 g        3.375 g 12.5 mL/hr over 240 Minutes Intravenous Every 8 hours 01/17/24 2149     01/17/24 2200  fluconazole  (DIFLUCAN ) IVPB 400 mg  Status:  Discontinued        400 mg 100 mL/hr over 120 Minutes Intravenous Every 24 hours 01/17/24 2059 01/29/24 1336       Assessment/Plan: Duodenal ulcer hemorrhage  PMH EGD and IR coil embolization of gastric ulcer in August 2025 Presented to [facility] 12/16 w/ hematemesis, EGD 12/17  showed bleed from  duodenal ulcer, clips placed Transferred to [facility] for critical care services and IR consult Arrived in hemorrhagic shock, critical, MTP started, taken emergently to the OR for below:    POD 13 S/P LAPAROTOMY, EXPLORATORY, Duodenotomy, Ligation of GDA and branches, ABThera wound VAC placement 12/17 Dr. Rubin  POD 11 S/P exp lap, abdominal wall closure 12/19 Dr. Ebbie; some bleeding from liver capsule controlled with cautery and surgicel snow.    - UGI 12/26 w/o evidence of leak, JP output stable - Soft diet - Wean off TNA - Surgery will follow - consider removing drain tomorrow   Per CCM: Hemorrhagic shock  Metastatic lung CA VDRF COPD OSA AKI  PMH CVA PMH afib on eliquis  - DOAC held in the setting of acute bleeding; OK to restart DOAC per primary team  LOS: 13 days    Donnice MARLA Lima 01/30/2024

## 2024-01-30 NOTE — Telephone Encounter (Signed)
 Patient's wife, [name], returned my phone call.  She reports patient is slowly recovering from surgery- he is going to rehab.  Her concerns regarding care centered around what she describes as poor patient/family communication in preparation for transfer to [facility] for IR intervention.  In addition, she is concerned that she reports that no IR physician ever spoke to her before or after the procedure was performed.  I suggested that she reach out to the hospitalist service at [facility] and the radiologist who performed the IR procedure in [place] to get further information/clarification.  I recommend the patient not any NSAIDs of any form ever in the future. I also recommend patient touch base with prescribing physician regarding Eliquis .  Continuing Eliquis  may or may not be worth the risk moving forward.  He should also continue pantoprazole  40 mg twice daily until instructed otherwise.  I be happy to see back in the office when feasible.

## 2024-01-30 NOTE — Progress Notes (Signed)
 PHARMACY - ANTICOAGULATION CONSULT NOTE  Pharmacy Consult for heparin  gtt Indication: atrial fibrillation and DVT  Allergies[1]  Patient Measurements: Height: 6' 0.01 (182.9 cm) Weight: (!) 136.7 kg (301 lb 5.9 oz) IBW/kg (Calculated) : 77.62 HEPARIN  DW (KG): 103.8  Vital Signs: Temp: 97.4 F (36.3 C) (12/30 1638) Temp Source: Oral (12/30 1638) BP: 130/85 (12/30 1638) Pulse Rate: 89 (12/30 1638)  Labs: Recent Labs    01/28/24 0355 01/28/24 1859 01/29/24 0330 01/29/24 1628 01/30/24 0323 01/30/24 1945  HGB 15.6   < > 15.5 15.3 15.1  --   HCT 48.8   < > 48.8 48.3 46.1  --   PLT  --   --   --   --  152  --   APTT  --   --   --   --   --  50*  HEPARINUNFRC  --   --   --   --   --  0.11*  CREATININE 1.34*  --  1.31*  --   --   --    < > = values in this interval not displayed.    Estimated Creatinine Clearance: 74 mL/min (A) (by C-G formula based on SCr of 1.31 mg/dL (H)).   Medical History: Past Medical History:  Diagnosis Date   Adrenal tumor    right adrenal gland tumor favored to be a myelolipoma   Atrial fibrillation (HCC)    Cancer (HCC)    stage 4 Lung   Cellulitis of right lower leg    COPD (chronic obstructive pulmonary disease) (HCC)    Dysrhythmia    A-fib   GI bleed    History of TIA (transient ischemic attack)    May 2020   Lymphedema    OSA on CPAP    severe obstructive sleep apnea with an AHI of 54/h with no significant central events.  He had nocturnal hypoxemia with O2 sat nadir of 70%. Now on CPAP   Pulmonary mass    Stroke [facility])     Medications:  Medications Prior to Admission  Medication Sig Dispense Refill Last Dose/Taking   acetaminophen  (TYLENOL ) 500 MG tablet Take 500 mg by mouth every 6 (six) hours as needed for mild pain (pain score 1-3).   Unknown   allopurinol  (ZYLOPRIM ) 100 MG tablet Take 100 mg by mouth daily.   Past Week   apixaban  (ELIQUIS ) 5 MG TABS tablet Take 1 tablet (5 mg total) by mouth 2 (two) times daily. Okay to  restart this medication on 08/29/2023   Past Week   budesonide -glycopyrrolate -formoterol  (BREZTRI  AEROSPHERE) 160-9-4.8 MCG/ACT AERO inhaler Inhale 2 puffs into the lungs in the morning and at bedtime. 3 each 4 Past Week   cholecalciferol  (VITAMIN D3) 25 MCG (1000 UNIT) tablet Take 1,000 Units by mouth daily.   Past Week   cyanocobalamin  (VITAMIN B12) 100 MCG tablet Take 100 mcg by mouth daily.   Past Week   folic acid  (FOLVITE ) 1 MG tablet TAKE 1 TABLET BY MOUTH EVERY DAY 90 tablet 3 Past Week   furosemide  (LASIX ) 20 MG tablet Take 10 mg by mouth daily.   Past Week   ipratropium-albuterol  (DUONEB) 0.5-2.5 (3) MG/3ML SOLN Take 3 mLs by nebulization every 6 (six) hours as needed. 360 mL 6 Unknown   loperamide  (IMODIUM  A-D) 2 MG tablet Take 2-4 mg by mouth 4 (four) times daily as needed for diarrhea or loose stools.   Unknown   metoprolol  tartrate (LOPRESSOR ) 25 MG tablet Take 1 tablet (25  mg total) by mouth 2 (two) times daily. 180 tablet 3 Past Week   thiamine  (VITAMIN B-1) 100 MG tablet Take 100 mg by mouth daily.   Past Week   pantoprazole  (PROTONIX ) 40 MG tablet TAKE 1 TABLET (40 MG TOTAL) BY MOUTH TWICE A DAY BEFORE MEALS 180 tablet 3     Assessment: [name] w/ Hx AF with an RUE DVT in need of AC. Apixaban  PTA. Last dose was >7 day ago. He was [facility] to [facility] w/  duodenal ulcer hemorrhage which was clipped on 12/17. Hgb has been stable and wnl   PM update: heparin  level 0.11 and aPTT 50 sec - both subtherapeutic. Per RN, no issues with the infusion or bleeding reported.  Goal of Therapy:  Heparin  level 0.3-0.5 units/ml aPTT 66-85 seconds Monitor platelets by anticoagulation protocol: Yes   Plan:  Increase heparin  infusion to 1900 units/hr Check anti-Xa level in 8 hours and daily while on heparin  Continue to monitor H&H and platelets   Rocky Slade, PharmD, BCPS Clinical Pharmacist 01/30/2024 8:22 PM  Contact: [phone] after 3 PM     [1] No Known Allergies

## 2024-01-30 NOTE — TOC Progression Note (Addendum)
 Transition of Care [facility]) - Progression Note    Patient Details  Name: Raymond Hurst MRN: [id number] Date of Birth: 01/05/1951  Transition of Care [facility]) CM/SW Contact  Lendia Dais, [place] Phone Number: 01/30/2024, 10:01 AM  Clinical Narrative:   Pt is not yet medically stable. Pt is being weaned off of TPN.  CSW received a VM from pt's wife who stated they wanted to accept the bed at [facility]. CSW left [name] a VM to confirm that her VM was received.  CSW confirmed with Tammy of [facility] of bed availability and informed her of EDD from [facility].  1620 - CSW received from [name] (spouse) who stated they gave the CSW the wrong name and have a preference for [facility]. CSW sent a message to both facilities and pending a response.  CSW will continue to monitor.      Barriers to Discharge: Continued Medical Work up               Expected Discharge Plan and Services                                               Social Drivers of Health (SDOH) Interventions SDOH Screenings   Food Insecurity: No Food Insecurity (01/16/2024)  Housing: Low Risk (01/26/2024)  Transportation Needs: No Transportation Needs (01/16/2024)  Utilities: Not At Risk (01/16/2024)  Alcohol Screen: Low Risk (06/15/2023)  Depression (PHQ2-9): Low Risk (01/12/2024)  Financial Resource Strain: High Risk (06/15/2023)  Physical Activity: Inactive (06/15/2023)  Social Connections: Moderately Isolated (01/16/2024)  Stress: No Stress Concern Present (06/15/2023)  Tobacco Use: Medium Risk (01/19/2024)    Readmission Risk Interventions    09/08/2023   12:09 PM  Readmission Risk Prevention Plan  Transportation Screening Complete  HRI or Home Care Consult Complete  Social Work Consult for Recovery Care Planning/Counseling Complete  Palliative Care Screening Not Applicable  Medication Review [employer]) Complete

## 2024-01-30 NOTE — Progress Notes (Signed)
 PHARMACY - TOTAL PARENTERAL NUTRITION CONSULT NOTE  Indication: Hemorrhagic perforated duodenal ulcer   Patient Measurements: Height: 6' 0.01 (182.9 cm) Weight: (!) 136.7 kg (301 lb 5.9 oz) IBW/kg (Calculated) : 77.62 TPN AdjBW (KG): 88.1 Body mass index is 40.86 kg/m. Usual Weight: around 117-119 kg > current weight of ~135 kg likely not accurate and elevated 2/2 acute illness, IVF and edema  Assessment:  89 YOM with PMH significant for Afib, CVA, COPD, lung cancer with mets, and multiple GIBs who is admitted with hematemesis after resuming apixaban  following recent admit 12/6-12/8 for melena. EGD on 12/8 showed non-bleeding duodenal ulcer with a clean ulcer base. He was transferred to [facility] for IR eval but not a candidate. He was taken to OR emergently for ex-lap duodenotomy and ligation of gastroduodenal artery. He is s/p MTP for duodenal hemorrhage requiring massive transfusion protocol, Kcentra , TXA and Factor VII. He has been NPO since surgery.  Pharmacy consulted to manage TPN.  Glucose / Insulin : A1c 5.5% - BG controlled off SSI Electrolytes: Na 145 (none in TPN), others WNL on 12/29 Renal:  SCr 1.31 (baseline 0.9), BUN 38 Hepatic: AST/ALT mildly elevated, alk phos / tbili / TG WNL, albumin 3 Intake / Output; MIVF:  UOP 1.3 mL/kg/hr, drain 10mL, LBM 12/29 x4  GI Imaging:  12/19 KUB: Embolization coils in upper abdomen  12/23 KUB: No bowel dilatation in the upper abdomen 12/26 UGI: No definite leakage or extravasation of contrast is noted. Large diverticulum is seen arising from third portion of duodenum GI Surgeries / Procedures:  12/17 EGD: red blood entire stomach, duodenal bulb, duodenum s/p hemostatic clip, hemospray  12/17 ex-lap with ligation of GDA and branches, VAC placement 12/19 abd wall closure, bleeding from liver capsule controlled with cautery and surgicel snow 12/25 NGT removed  Central access: CVC placed 01/17/24 TPN start date: 01/21/24  Nutritional  Goals: Goal TPN rate is 85 mL/hr (provides 120g AA and 2104 kCal per day)  RD Estimated Needs Total Energy Estimated Needs: 2100-2300 Total Protein Estimated Needs: 115-130g Total Fluid Estimated Needs: >/=2L  Current Nutrition:  TPN FLD - ate 80% of meals without complaints Ensure Plus High Protein BID - 1 charted given on 12/29  Plan:    Advance to soft diet and half TPN today per Surgery Reduce TPN to 45 ml/hr at 1800 Electrolytes in TPN: Na 0 mEq/L, K 80 mEq/L (= 86 mEq/bag), Ca 3 mEq/L, decrease Mg 8 mEq/L on 12/29, Phos 15 mmol/L, max acetate  No standard MVI and trace elements in TPN, give as PO Monitor TPN labs on Mon/Thurs and PRN  - labs in AM F/u with PO intake to wean off of TPN in AM if able Encouraged patient to drink fluids to prevent hyperNa/restarting D5W  Rilynne Lonsway D. Lendell, PharmD, BCPS, BCCCP 01/30/2024, 9:20 AM

## 2024-01-30 NOTE — Progress Notes (Signed)
 PHARMACY - ANTICOAGULATION CONSULT NOTE  Pharmacy Consult for heparin  gtt Indication: atrial fibrillation and DVT  Allergies[1]  Patient Measurements: Height: 6' 0.01 (182.9 cm) Weight: (!) 136.7 kg (301 lb 5.9 oz) IBW/kg (Calculated) : 77.62 HEPARIN  DW (KG): 103.8  Vital Signs: Temp: 97.8 F (36.6 C) (12/30 0441) BP: 154/91 (12/30 0742) Pulse Rate: 83 (12/30 0742)  Labs: Recent Labs    01/28/24 0355 01/28/24 1859 01/29/24 0330 01/29/24 1628 01/30/24 0323  HGB 15.6   < > 15.5 15.3 15.1  HCT 48.8   < > 48.8 48.3 46.1  PLT  --   --   --   --  152  CREATININE 1.34*  --  1.31*  --   --    < > = values in this interval not displayed.    Estimated Creatinine Clearance: 74 mL/min (A) (by C-G formula based on SCr of 1.31 mg/dL (H)).   Medical History: Past Medical History:  Diagnosis Date   Adrenal tumor    right adrenal gland tumor favored to be a myelolipoma   Atrial fibrillation (HCC)    Cancer (HCC)    stage 4 Lung   Cellulitis of right lower leg    COPD (chronic obstructive pulmonary disease) (HCC)    Dysrhythmia    A-fib   GI bleed    History of TIA (transient ischemic attack)    May 2020   Lymphedema    OSA on CPAP    severe obstructive sleep apnea with an AHI of 54/h with no significant central events.  He had nocturnal hypoxemia with O2 sat nadir of 70%. Now on CPAP   Pulmonary mass    Stroke [facility])     Medications:  Medications Prior to Admission  Medication Sig Dispense Refill Last Dose/Taking   acetaminophen  (TYLENOL ) 500 MG tablet Take 500 mg by mouth every 6 (six) hours as needed for mild pain (pain score 1-3).   Unknown   allopurinol  (ZYLOPRIM ) 100 MG tablet Take 100 mg by mouth daily.   Past Week   apixaban  (ELIQUIS ) 5 MG TABS tablet Take 1 tablet (5 mg total) by mouth 2 (two) times daily. Okay to restart this medication on 08/29/2023   Past Week   budesonide -glycopyrrolate -formoterol  (BREZTRI  AEROSPHERE) 160-9-4.8 MCG/ACT AERO inhaler Inhale 2  puffs into the lungs in the morning and at bedtime. 3 each 4 Past Week   cholecalciferol  (VITAMIN D3) 25 MCG (1000 UNIT) tablet Take 1,000 Units by mouth daily.   Past Week   cyanocobalamin  (VITAMIN B12) 100 MCG tablet Take 100 mcg by mouth daily.   Past Week   folic acid  (FOLVITE ) 1 MG tablet TAKE 1 TABLET BY MOUTH EVERY DAY 90 tablet 3 Past Week   furosemide  (LASIX ) 20 MG tablet Take 10 mg by mouth daily.   Past Week   ipratropium-albuterol  (DUONEB) 0.5-2.5 (3) MG/3ML SOLN Take 3 mLs by nebulization every 6 (six) hours as needed. 360 mL 6 Unknown   loperamide  (IMODIUM  A-D) 2 MG tablet Take 2-4 mg by mouth 4 (four) times daily as needed for diarrhea or loose stools.   Unknown   metoprolol  tartrate (LOPRESSOR ) 25 MG tablet Take 1 tablet (25 mg total) by mouth 2 (two) times daily. 180 tablet 3 Past Week   thiamine  (VITAMIN B-1) 100 MG tablet Take 100 mg by mouth daily.   Past Week   pantoprazole  (PROTONIX ) 40 MG tablet TAKE 1 TABLET (40 MG TOTAL) BY MOUTH TWICE A DAY BEFORE MEALS 180 tablet 3  Assessment: 39 YOM w/ Hx AF with an RUE DVT in need of AC. Apixaban  PTA. Last dose was >7 day ago. He was admitted to [facility] w/  duodenal ulcer hemorrhage which was clipped on 12/17. Hgb has been stable and wnl   Goal of Therapy:  Heparin  level 0.3-0.5 units/ml aPTT 66-85 seconds Monitor platelets by anticoagulation protocol: Yes   Plan:  Give 3000 units bolus x 1 [aware patient received HSQ at 0630. This bolus dose has been reduced] Start heparin  infusion at 1600 units/hr Check anti-Xa level in 8 hours and daily while on heparin  May DC aPTT if correlates w/ HL [suspect it will given time period between last dose of apixaban  and start of hep gtt] Continue to monitor H&H and platelets   Syble Picco BS, PharmD, BCPS Clinical Pharmacist 01/30/2024 9:39 AM  Contact: [phone] after 3 PM    [1] No Known Allergies

## 2024-01-30 NOTE — Telephone Encounter (Signed)
 Pt 's wife would like a c.b regarding pt currently being admitted into the hospital for GI bleed. Wife states that if at all possible she does not want pt to be prescribed Eliquis . Please advise

## 2024-01-30 NOTE — Consult Note (Signed)
 "        [facility]    Date of Admission:  01/16/2024     Reason for Consult: bacteremia    Referring Provider: Cindy    Lines: 12/17-c right internal jugular cvc  Abx: 12/24-c vanc 12/17-c fluconazole  12/17-c piptazo        Assessment: 73 yo male left lung cancer, copd, afib on eliquis , hx cva, hx gib due to PUD, chronic venous stasis admitted 12/16 for hematemesis found to have hemorrhagic shock due to duodenal ulcer hemorrhage, and course also with mrse bsi   Sepsis In setting duodenal hemorrhage/perforation Mrse bsi but no risk factors for seeding  S/p peritonitis tx course and mrse bsi course  Duodenal ulcer hemorrhage  PMH EGD and IR coil embolization of gastric ulcer in August 2025 Presented to [facility] 12/16 w/ hematemesis, EGD 12/17 showed bleed from duodenal ulcer, clips placed Required MTP S/p laparotomy with ligation of gda and branches on 12/17 Surgery following    Plan: Stop all abx today The right cvc line needs to be removed Supportive care for post surgical recovery per surgery team Maintain standard isolation precaution Id will sign off       ------------------------------------------------ Principal Problem:   Upper GI bleeding Active Problems:   Morbid obesity due to excess calories (HCC)   Lymphedema   OSA on CPAP   Acute blood loss anemia   Atrial fibrillation, chronic (HCC)   Hypotension due to hypovolemia   Hemorrhagic shock (HCC)   Malnutrition of moderate degree    HPI: Raymond Hurst is a 73 y.o. male copd, afib on eliquis , hx cva, hx gib due to PUD, chronic venous stasis admitted 12/16 for hematemesis found to have hemorrhagic shock due to duodenal ulcer hemorrhage, and course also with mrse bsi  He has had duodenal ulcer related bleed and recent clipping, admitted 12/16 for hemorrhagic shock W/u showed duodenal perforation where previous ulcer was   He had undergone ex-lap and surgical  repair  Had periotinitis and on abx with resolution During course also has mrse bsi No prosthetics/cardiac device Repeat bcx negative  He has received 7 days vanc for mrse and almost 2 weeks abx for peritonitis  No complaint today  He does have right internal jugular cvc line  Family History  Problem Relation Age of Onset   Heart failure Mother    COPD Mother    Atrial fibrillation Mother    Idiopathic pulmonary fibrosis Father     Social History[1]  Allergies[2]  Review of Systems: ROS All Other ROS was negative, except mentioned above   Past Medical History:  Diagnosis Date   Adrenal tumor    right adrenal gland tumor favored to be a myelolipoma   Atrial fibrillation (HCC)    Cancer (HCC)    stage 4 Lung   Cellulitis of right lower leg    COPD (chronic obstructive pulmonary disease) (HCC)    Dysrhythmia    A-fib   GI bleed    History of TIA (transient ischemic attack)    May 2020   Lymphedema    OSA on CPAP    severe obstructive sleep apnea with an AHI of 54/h with no significant central events.  He had nocturnal hypoxemia with O2 sat nadir of 70%. Now on CPAP   Pulmonary mass    Stroke (HCC)        Scheduled Meds:  budesonide -glycopyrrolate -formoterol   2 puff Inhalation BID   carbamide peroxide  5 drop Right EAR BID   Chlorhexidine  Gluconate Cloth  6 each Topical Daily   feeding supplement  237 mL Oral BID BM   Gerhardt's butt cream   Topical BID   multivitamin with minerals  1 tablet Oral Daily   mouth rinse  15 mL Mouth Rinse TID   pantoprazole  (PROTONIX ) IV  40 mg Intravenous Q12H   Continuous Infusions:  fluconazole  (DIFLUCAN ) IV 400 mg (01/29/24 2301)   heparin  1,600 Units/hr (01/30/24 1138)   piperacillin -tazobactam (ZOSYN )  IV 3.375 g (01/30/24 0629)   TPN ADULT (ION) 85 mL/hr at 01/29/24 1922   TPN ADULT (ION)     vancomycin  150 mL/hr at 01/29/24 1922   PRN Meds:.acetaminophen , hydrALAZINE , HYDROcodone -acetaminophen , hydrOXYzine ,  ipratropium-albuterol , labetalol , ondansetron  (ZOFRAN ) IV, sodium chloride  flush   OBJECTIVE: Blood pressure (!) 154/91, pulse 83, temperature 97.8 F (36.6 C), resp. rate 18, height 6' 0.01 (1.829 m), weight (!) 136.7 kg, SpO2 99%.  Physical Exam  General/constitutional: no distress, pleasant HEENT: Normocephalic, PER, Conj Clear, EOMI, Oropharynx clear Neck supple CV: rrr no mrg Lungs: clear to auscultation, normal respiratory effort Abd: Soft, Nontender -- dressing c/d Ext: 1+ bilateral lower edema with hyperpigmented skin changes Skin: No Rash Neuro: nonfocal MSK: no peripheral joint swelling/tenderness/warmth; back spines nontender   Lab Results Lab Results  Component Value Date   WBC 8.7 01/30/2024   HGB 15.1 01/30/2024   HCT 46.1 01/30/2024   MCV 90.7 01/30/2024   PLT 152 01/30/2024    Lab Results  Component Value Date   CREATININE 1.31 (H) 01/29/2024   BUN 38 (H) 01/29/2024   NA 145 01/29/2024   K 4.1 01/29/2024   CL 109 01/29/2024   CO2 25 01/29/2024    Lab Results  Component Value Date   ALT 58 (H) 01/29/2024   AST 50 (H) 01/29/2024   ALKPHOS 92 01/29/2024   BILITOT 0.9 01/29/2024      Microbiology: Recent Results (from the past 240 hours)  Culture, blood (Routine X 2) w Reflex to ID Panel     Status: Abnormal   Collection Time: 01/21/24 11:00 PM   Specimen: BLOOD  Result Value Ref Range Status   Specimen Description BLOOD SITE NOT SPECIFIED  Final   Special Requests   Final    BOTTLES DRAWN AEROBIC AND ANAEROBIC Blood Culture adequate volume   Culture  Setup Time   Final    GRAM POSITIVE COCCI ANAEROBIC BOTTLE ONLY CRITICAL VALUE NOTED.  VALUE IS CONSISTENT WITH PREVIOUSLY REPORTED AND CALLED VALUE.    Culture (A)  Final    STAPHYLOCOCCUS EPIDERMIDIS SUSCEPTIBILITIES PERFORMED ON PREVIOUS CULTURE WITHIN THE LAST 5 DAYS. Performed at [facility], [address], [place], [place] [address]    Report Status 01/25/2024 FINAL  Final   Culture, blood (Routine X 2) w Reflex to ID Panel     Status: Abnormal   Collection Time: 01/21/24 11:12 PM   Specimen: BLOOD  Result Value Ref Range Status   Specimen Description BLOOD SITE NOT SPECIFIED  Final   Special Requests   Final    BOTTLES DRAWN AEROBIC ONLY Blood Culture results may not be optimal due to an inadequate volume of blood received in culture bottles   Culture  Setup Time   Final    GRAM POSITIVE COCCI IN CLUSTERS AEROBIC BOTTLE ONLY CRITICAL RESULT CALLED TO, READ BACK BY AND VERIFIED WITH: MAYA ALMARIE HERO [phone] FCP Performed at [facility], [address],  [place], [place] [address]    Culture STAPHYLOCOCCUS EPIDERMIDIS (A)  Final   Report Status 01/25/2024 FINAL  Final   Organism ID, Bacteria STAPHYLOCOCCUS EPIDERMIDIS  Final      Susceptibility   Staphylococcus epidermidis - MIC*    CIPROFLOXACIN 4 RESISTANT Resistant     ERYTHROMYCIN <=0.25 SENSITIVE Sensitive     GENTAMICIN >=16 RESISTANT Resistant     OXACILLIN >=4 RESISTANT Resistant     TETRACYCLINE 2 SENSITIVE Sensitive     VANCOMYCIN  1 SENSITIVE Sensitive     TRIMETH /SULFA  80 RESISTANT Resistant     CLINDAMYCIN <=0.25 SENSITIVE Sensitive     RIFAMPIN <=0.5 SENSITIVE Sensitive     Inducible Clindamycin NEGATIVE Sensitive     * STAPHYLOCOCCUS EPIDERMIDIS  Blood Culture ID Panel (Reflexed)     Status: Abnormal   Collection Time: 01/21/24 11:12 PM  Result Value Ref Range Status   Enterococcus faecalis NOT DETECTED NOT DETECTED Final   Enterococcus Faecium NOT DETECTED NOT DETECTED Final   Listeria monocytogenes NOT DETECTED NOT DETECTED Final   Staphylococcus species DETECTED (A) NOT DETECTED Final    Comment: CRITICAL RESULT CALLED TO, READ BACK BY AND VERIFIED WITH: PHARMD ELIZABETH M [id number] FCP    Staphylococcus aureus (BCID) NOT DETECTED NOT DETECTED Final   Staphylococcus epidermidis DETECTED (A) NOT DETECTED Final    Comment: Methicillin (oxacillin) resistant coagulase  negative staphylococcus. Possible blood culture contaminant (unless isolated from more than one blood culture draw or clinical case suggests pathogenicity). No antibiotic treatment is indicated for blood  culture contaminants. CRITICAL RESULT CALLED TO, READ BACK BY AND VERIFIED WITH: PHARMD ELIZABETH M [id number] FCP    Staphylococcus lugdunensis NOT DETECTED NOT DETECTED Final   Streptococcus species NOT DETECTED NOT DETECTED Final   Streptococcus agalactiae NOT DETECTED NOT DETECTED Final   Streptococcus pneumoniae NOT DETECTED NOT DETECTED Final   Streptococcus pyogenes NOT DETECTED NOT DETECTED Final   A.calcoaceticus-baumannii NOT DETECTED NOT DETECTED Final   Bacteroides fragilis NOT DETECTED NOT DETECTED Final   Enterobacterales NOT DETECTED NOT DETECTED Final   Enterobacter cloacae complex NOT DETECTED NOT DETECTED Final   Escherichia coli NOT DETECTED NOT DETECTED Final   Klebsiella aerogenes NOT DETECTED NOT DETECTED Final   Klebsiella oxytoca NOT DETECTED NOT DETECTED Final   Klebsiella pneumoniae NOT DETECTED NOT DETECTED Final   Proteus species NOT DETECTED NOT DETECTED Final   Salmonella species NOT DETECTED NOT DETECTED Final   Serratia marcescens NOT DETECTED NOT DETECTED Final   Haemophilus influenzae NOT DETECTED NOT DETECTED Final   Neisseria meningitidis NOT DETECTED NOT DETECTED Final   Pseudomonas aeruginosa NOT DETECTED NOT DETECTED Final   Stenotrophomonas maltophilia NOT DETECTED NOT DETECTED Final   Candida albicans NOT DETECTED NOT DETECTED Final   Candida auris NOT DETECTED NOT DETECTED Final   Candida glabrata NOT DETECTED NOT DETECTED Final   Candida krusei NOT DETECTED NOT DETECTED Final   Candida parapsilosis NOT DETECTED NOT DETECTED Final   Candida tropicalis NOT DETECTED NOT DETECTED Final   Cryptococcus neoformans/gattii NOT DETECTED NOT DETECTED Final   Methicillin resistance mecA/C DETECTED (A) NOT DETECTED Final    Comment: CRITICAL RESULT  CALLED TO, READ BACK BY AND VERIFIED WITH: MAYA ALMARIE HERO [phone] FCP Performed at [facility], [address] [place], [place] [address]   Culture, blood (Routine X 2) w Reflex to ID Panel     Status: None (Preliminary result)   Collection Time: 01/26/24  2:24 PM  Specimen: BLOOD RIGHT HAND  Result Value Ref Range Status   Specimen Description BLOOD RIGHT HAND  Final   Special Requests   Final    BOTTLES DRAWN AEROBIC ONLY Blood Culture results may not be optimal due to an inadequate volume of blood received in culture bottles   Culture   Final    NO GROWTH 4 DAYS Performed at [facility], [address] [place], [place] [address]    Report Status PENDING  Incomplete  Culture, blood (Routine X 2) w Reflex to ID Panel     Status: None (Preliminary result)   Collection Time: 01/26/24  2:27 PM   Specimen: BLOOD LEFT HAND  Result Value Ref Range Status   Specimen Description BLOOD LEFT HAND  Final   Special Requests   Final    BOTTLES DRAWN AEROBIC ONLY Blood Culture results may not be optimal due to an inadequate volume of blood received in culture bottles   Culture   Final    NO GROWTH 4 DAYS Performed at [facility], [address] [place], [place] [address]    Report Status PENDING  Incomplete     Serology:    Imaging: If present, new imagings (plain films, ct scans, and mri) have been personally visualized and interpreted; radiology reports have been reviewed. Decision making incorporated into the Impression / Recommendations.  12/23 kub FINDINGS: Tip and side port of the enteric tube below the diaphragm in the stomach. Again seen coiling in the upper abdomen. Catheter/drain in the right mid abdomen is again seen. No bowel dilatation in the included upper abdomen. Retrocardiac opacity peer   IMPRESSION: Tip and side port of the enteric tube below the diaphragm in the stomach.   12/26 ugi FINDINGS: Visualized portion of the  esophagus is unremarkable. The flow of contrast from esophagus into stomach is noted. Contrast slowly flowed from the stomach into duodenum. No definite leakage or extravasation is noted. Large diverticulum is seen arising from third portion of duodenum. Embolization coils are noted near second portion of duodenum. Visualized portion of proximal small bowel is otherwise unremarkable.   IMPRESSION: No definite leakage or extravasation of contrast is noted. Large diverticulum is seen arising from third portion of duodenum.  Constance ONEIDA Passer, MD [facility] [phone] pager    01/30/2024, 12:05 PM     [1]  Social History Tobacco Use   Smoking status: Former    Current packs/day: 0.00    Average packs/day: 2.0 packs/day for 20.0 years (40.0 ttl pk-yrs)    Types: Cigarettes    Start date: 01/31/1970    Quit date: 01/31/1990    Years since quitting: 34.0   Smokeless tobacco: Never  Vaping Use   Vaping status: Never Used  Substance Use Topics   Alcohol use: Not Currently    Comment: 4-5 beer daily   Drug use: No  [2] No Known Allergies  "

## 2024-01-30 NOTE — Plan of Care (Signed)
 " Problem: Education: Goal: Knowledge of General Education information will improve Description: Including pain rating scale, medication(s)/side effects and non-pharmacologic comfort measures 01/30/2024 1830 by Sheran Loving, LPN Outcome: Progressing 01/30/2024 1828 by Sheran Loving, LPN Outcome: Progressing   Problem: Health Behavior/Discharge Planning: Goal: Ability to manage health-related needs will improve 01/30/2024 1830 by Sheran Loving, LPN Outcome: Progressing 01/30/2024 1828 by Sheran Loving, LPN Outcome: Progressing   Problem: Clinical Measurements: Goal: Ability to maintain clinical measurements within normal limits will improve 01/30/2024 1830 by Sheran Loving, LPN Outcome: Progressing 01/30/2024 1828 by Sheran Loving, LPN Outcome: Progressing Goal: Will remain free from infection 01/30/2024 1830 by Sheran Loving, LPN Outcome: Progressing 01/30/2024 1828 by Sheran Loving, LPN Outcome: Progressing Goal: Diagnostic test results will improve 01/30/2024 1830 by Sheran Loving, LPN Outcome: Progressing 01/30/2024 1828 by Sheran Loving, LPN Outcome: Progressing Goal: Respiratory complications will improve 01/30/2024 1830 by Sheran Loving, LPN Outcome: Progressing 01/30/2024 1828 by Sheran Loving, LPN Outcome: Progressing Goal: Cardiovascular complication will be avoided 01/30/2024 1830 by Sheran Loving, LPN Outcome: Progressing 01/30/2024 1828 by Sheran Loving, LPN Outcome: Progressing   Problem: Activity: Goal: Risk for activity intolerance will decrease 01/30/2024 1830 by Sheran Loving, LPN Outcome: Progressing 01/30/2024 1828 by Sheran Loving, LPN Outcome: Progressing   Problem: Nutrition: Goal: Adequate nutrition will be maintained 01/30/2024 1830 by Sheran Loving, LPN Outcome: Progressing 01/30/2024 1828 by  Sheran Loving, LPN Outcome: Progressing   Problem: Coping: Goal: Level of anxiety will decrease 01/30/2024 1830 by Sheran Loving, LPN Outcome: Progressing 01/30/2024 1828 by Sheran Loving, LPN Outcome: Progressing   Problem: Elimination: Goal: Will not experience complications related to bowel motility 01/30/2024 1830 by Sheran Loving, LPN Outcome: Progressing 01/30/2024 1828 by Sheran Loving, LPN Outcome: Progressing Goal: Will not experience complications related to urinary retention 01/30/2024 1830 by Sheran Loving, LPN Outcome: Progressing 01/30/2024 1828 by Sheran Loving, LPN Outcome: Progressing   Problem: Pain Managment: Goal: General experience of comfort will improve and/or be controlled 01/30/2024 1830 by Sheran Loving, LPN Outcome: Progressing 01/30/2024 1828 by Sheran Loving, LPN Outcome: Progressing   Problem: Safety: Goal: Ability to remain free from injury will improve 01/30/2024 1830 by Sheran Loving, LPN Outcome: Progressing 01/30/2024 1828 by Sheran Loving, LPN Outcome: Progressing   Problem: Skin Integrity: Goal: Risk for impaired skin integrity will decrease 01/30/2024 1830 by Sheran Loving, LPN Outcome: Progressing 01/30/2024 1828 by Sheran Loving, LPN Outcome: Progressing   Problem: Education: Goal: Ability to describe self-care measures that may prevent or decrease complications (Diabetes Survival Skills Education) will improve 01/30/2024 1830 by Sheran Loving, LPN Outcome: Progressing 01/30/2024 1828 by Sheran Loving, LPN Outcome: Progressing Goal: Individualized Educational Video(s) 01/30/2024 1830 by Sheran Loving, LPN Outcome: Progressing 01/30/2024 1828 by Sheran Loving, LPN Outcome: Progressing   Problem: Coping: Goal: Ability to adjust to condition or change in health will improve 01/30/2024 1830  by Sheran Loving, LPN Outcome: Progressing 01/30/2024 1828 by Sheran Loving, LPN Outcome: Progressing   Problem: Fluid Volume: Goal: Ability to maintain a balanced intake and output will improve 01/30/2024 1830 by Sheran Loving, LPN Outcome: Progressing 01/30/2024 1828 by Sheran Loving, LPN Outcome: Progressing   Problem: Health Behavior/Discharge Planning: Goal: Ability to identify and utilize available resources and services will improve 01/30/2024 1830 by Sheran Loving, LPN Outcome: Progressing 01/30/2024 1828 by Sheran Loving, LPN Outcome: Progressing Goal: Ability to manage health-related needs will improve 01/30/2024 1830 by Sheran Loving, LPN Outcome: Progressing 01/30/2024 1828 by Sheran Loving, LPN Outcome: Progressing   Problem: Metabolic: Goal: Ability to maintain appropriate  glucose levels will improve 01/30/2024 1830 by Sheran Loving, LPN Outcome: Progressing 01/30/2024 1828 by Sheran Loving, LPN Outcome: Progressing   Problem: Nutritional: Goal: Maintenance of adequate nutrition will improve 01/30/2024 1830 by Sheran Loving, LPN Outcome: Progressing 01/30/2024 1828 by Sheran Loving, LPN Outcome: Progressing Goal: Progress toward achieving an optimal weight will improve 01/30/2024 1830 by Sheran Loving, LPN Outcome: Progressing 01/30/2024 1828 by Sheran Loving, LPN Outcome: Progressing   Problem: Skin Integrity: Goal: Risk for impaired skin integrity will decrease 01/30/2024 1830 by Sheran Loving, LPN Outcome: Progressing 01/30/2024 1828 by Sheran Loving, LPN Outcome: Progressing   Problem: Tissue Perfusion: Goal: Adequacy of tissue perfusion will improve 01/30/2024 1830 by Sheran Loving, LPN Outcome: Progressing 01/30/2024 1828 by Sheran Loving, LPN Outcome: Progressing   "

## 2024-01-31 DIAGNOSIS — I82611 Acute embolism and thrombosis of superficial veins of right upper extremity: Secondary | ICD-10-CM | POA: Diagnosis not present

## 2024-01-31 DIAGNOSIS — D696 Thrombocytopenia, unspecified: Secondary | ICD-10-CM | POA: Diagnosis not present

## 2024-01-31 DIAGNOSIS — R578 Other shock: Secondary | ICD-10-CM | POA: Diagnosis not present

## 2024-01-31 DIAGNOSIS — D62 Acute posthemorrhagic anemia: Secondary | ICD-10-CM | POA: Diagnosis not present

## 2024-01-31 DIAGNOSIS — K922 Gastrointestinal hemorrhage, unspecified: Secondary | ICD-10-CM | POA: Diagnosis not present

## 2024-01-31 DIAGNOSIS — I82721 Chronic embolism and thrombosis of deep veins of right upper extremity: Secondary | ICD-10-CM | POA: Diagnosis not present

## 2024-01-31 DIAGNOSIS — I482 Chronic atrial fibrillation, unspecified: Secondary | ICD-10-CM | POA: Diagnosis not present

## 2024-01-31 LAB — CULTURE, BLOOD (ROUTINE X 2)
Culture: NO GROWTH
Culture: NO GROWTH

## 2024-01-31 LAB — BASIC METABOLIC PANEL WITH GFR
Anion gap: 10 (ref 5–15)
BUN: 39 mg/dL — ABNORMAL HIGH (ref 8–23)
CO2: 24 mmol/L (ref 22–32)
Calcium: 9.1 mg/dL (ref 8.9–10.3)
Chloride: 104 mmol/L (ref 98–111)
Creatinine, Ser: 1.31 mg/dL — ABNORMAL HIGH (ref 0.61–1.24)
GFR, Estimated: 58 mL/min — ABNORMAL LOW
Glucose, Bld: 106 mg/dL — ABNORMAL HIGH (ref 70–99)
Potassium: 4.3 mmol/L (ref 3.5–5.1)
Sodium: 139 mmol/L (ref 135–145)

## 2024-01-31 LAB — HEPARIN LEVEL (UNFRACTIONATED)
Heparin Unfractionated: 0.2 [IU]/mL — ABNORMAL LOW (ref 0.30–0.70)
Heparin Unfractionated: 0.29 [IU]/mL — ABNORMAL LOW (ref 0.30–0.70)
Heparin Unfractionated: 0.22 [IU]/mL — ABNORMAL LOW (ref 0.30–0.70)

## 2024-01-31 LAB — MAGNESIUM: Magnesium: 2.4 mg/dL (ref 1.7–2.4)

## 2024-01-31 LAB — PHOSPHORUS: Phosphorus: 4.3 mg/dL (ref 2.5–4.6)

## 2024-01-31 MED ORDER — SODIUM CHLORIDE (PF) 0.9 % IJ SOLN
INTRAMUSCULAR | Status: AC
  Administered 2024-01-31: 10 mL
  Filled 2024-01-31: qty 10

## 2024-01-31 NOTE — Progress Notes (Signed)
 " PROGRESS NOTE    Raymond Hurst  FMW:[id number] DOB: 1950-09-17 DOA: 01/16/2024 PCP: [name], MD    Brief Narrative:  73 year old male Left lung mass bronchogenic CA (poorly differentiated squamous cell) diagnosed 06/05/2023-bronchoscopy 08/09/2023 complicated by left PNA thorax-follows with Dr. Arlester carboplatin  and Taxol --currently durvalumab  Permanent A-fib CHA2DS2-VASc >4 on Eliquis  Previous heavy EtOH UGIB 09/09/2023 cratered ulcer duodenum status post mesenteric angio/embolization @ [facility] 12/6-12/8 readmission--with upper GI bleed clean-based ulcer told to continue twice daily PPI manage resumed Eliquis  hemoglobin was 9.6 on discharge  Significant Events: 12/16 [facility] ED eval dizziness nausea-couple of bright red vomit-->?  ICU [facility]--> [address] [place] transfer 2/2 12/17 ICU [facility]-->  [place] [facility] transfer 2/2--IR duodenal bleed Clip ->?  Continued ongoing bleeding requiring pressors--- massive transfusion protocol-----emergent ex lap Dr. Rubin with duodenotomy ligation GDA branches VAC placement 12/19 Secondary closure of abdomen Dr. Ebbie  12/26 barium swallow performed    Assessment and Plan:  Duodenal ulcer hemorrhage with coiling 09/2023 Laparotomy 12/17, secondary closure 12/19 as above On TPN, weaning off today per Gen Surg Vomiting resolved from 12/26 Diet being advanced by gen surg, now on soft diet JP drain removed 12/31 PPI BID   Sepsis secondary to above  Staph epi bacteremia-MRSE with resistances -likely peritonitis Formally consulted ID on 12/30  Pt has completed course of antibiotics as of 12/30  Remove CVL once pt is weaned off TPN   Hypernatremia Thought to be secondary to free water  deficit Resolved   AKI on admission--- resolving -see above as well as fluid status Cr stable at 1.31, baseline Cr 0.7 - 0.9 Hypokalemia Resolved Urinary retention Foley was initially removed and patient was voiding well. However, noted to be  retaining again today.  indwelling cath placed. Will need outpt f/u with urology   Left lung mass bronchogenic CA Holding immunotherapy/chemotherapy Outpatient follow-up   Critical illness thrombocytopenia - resolved No schistocytes on smear review Plt 152 on 12/30   Atrial fibrillation CHADVASC >4 previously on Eliquis  OK for DOAC per General Surgery, discussed with Dr. Belinda R Arm swelling secondary to R cephalic vein and chronic DVT of R brachial vein Noted on RUE doppler OK to anticoagulate per General Surgery, Dr. Tsuei Heparin  gtt was ordered   Scheduled Meds:  budesonide -glycopyrrolate -formoterol   2 puff Inhalation BID   carbamide peroxide  5 drop Right EAR BID   Chlorhexidine  Gluconate Cloth  6 each Topical Daily   feeding supplement  237 mL Oral BID BM   Gerhardt's butt cream   Topical BID   multivitamin with minerals  1 tablet Oral Daily   mouth rinse  15 mL Mouth Rinse TID   pantoprazole  (PROTONIX ) IV  40 mg Intravenous Q12H   Continuous Infusions:  heparin  2,200 Units/hr (01/31/24 1420)   PRN Meds:.acetaminophen , hydrALAZINE , HYDROcodone -acetaminophen , hydrOXYzine , ipratropium-albuterol , labetalol , ondansetron  (ZOFRAN ) IV, sodium chloride  flush  Current Outpatient Medications  Medication Instructions   acetaminophen  (TYLENOL ) 500 mg, Every 6 hours PRN   allopurinol  (ZYLOPRIM ) 100 mg, Daily   apixaban  (ELIQUIS ) 5 mg, Oral, 2 times daily, Okay to restart this medication on 08/29/2023   budesonide -glycopyrrolate -formoterol  (BREZTRI  AEROSPHERE) 160-9-4.8 MCG/ACT AERO inhaler 2 puffs, Inhalation, 2 times daily   cholecalciferol  (VITAMIN D3) 1,000 Units, Daily   cyanocobalamin  (VITAMIN B12) 100 mcg, Daily   folic acid  (FOLVITE ) 1 mg, Oral, Daily   furosemide  (LASIX ) 10 mg, Oral, Daily   ipratropium-albuterol  (DUONEB) 0.5-2.5 (3) MG/3ML SOLN 3 mLs, Nebulization, Every 6 hours PRN   loperamide  (IMODIUM  A-D)  2-4 mg, 4 times daily PRN   metoprolol  tartrate (LOPRESSOR )  25 mg, Oral, 2 times daily   pantoprazole  (PROTONIX ) 40 MG tablet TAKE 1 TABLET (40 MG TOTAL) BY MOUTH TWICE A DAY BEFORE MEALS   thiamine  (VITAMIN B-1) 100 mg, Daily    DVT prophylaxis: SCDs Start: 01/16/24 1731   Code Status:   Code Status: Full Code  Family Communication: None  Disposition Plan: SNF pending gen surg clearance for discharge PT - Follow Up Recommendations: Skilled nursing-short term rehab (<3 hours/day) - PT equipment: BSC/3in1, Hospital bed OT - Follow Up Recommendations: Skilled nursing-short term rehab (<3 hours/day) -    Level of care: Telemetry  Consultants:  General surgery, ID, GI, PCCM    Subjective: NAEO. Patient reports doing fair. No abdominal pain. Tolerating PO intake well without N/V  Objective: Vitals:   01/31/24 0523 01/31/24 0727 01/31/24 1647 01/31/24 1937  BP: 127/87 124/77 (!) 131/93 129/84  Pulse: 89 88 81 70  Resp:  18 18 18   Temp:  98 F (36.7 C) 97.6 F (36.4 C) 97.6 F (36.4 C)  TempSrc:  Oral    SpO2: 97% 99% 96% 97%  Weight:      Height:        Intake/Output Summary (Last 24 hours) at 01/31/2024 2318 Last data filed at 01/31/2024 1700 Gross per 24 hour  Intake 1024.56 ml  Output 3692 ml  Net -2667.44 ml   Filed Weights   01/22/24 0232 01/25/24 0452 01/29/24 0406  Weight: 135.7 kg 127.2 kg (!) 136.7 kg    Examination:  Gen: NAD, A&Ox3 HEENT: NCAT Neck: Supple, RIJ CVC  CV: RRR, no murmurs Resp: normal WOB, CTAB anteriorly Abd: Soft, NTND, no guarding, midline incision dressing c/d/i Ext: BLE swelling and chronic venous stasis changes Skin: Warm, dry Neuro: No focal deficits Psych: Calm, cooperative, appropriate affect    Data Reviewed: I have personally reviewed following labs and imaging studies  CBC: Recent Labs  Lab 01/28/24 0355 01/28/24 1859 01/29/24 0330 01/29/24 1628 01/30/24 0323  WBC  --   --   --   --  8.7  HGB 15.6 16.6 15.5 15.3 15.1  HCT 48.8 51.7 48.8 48.3 46.1  MCV  --   --   --    --  90.7  PLT  --   --   --   --  152   Basic Metabolic Panel: Recent Labs  Lab 01/25/24 0435 01/26/24 1015 01/27/24 0316 01/28/24 0355 01/29/24 0330 01/31/24 0312  NA 153* 148* 145 146* 145 139  K 3.4* 3.4* 3.7 3.9 4.1 4.3  CL 119* 114* 112* 112* 109 104  CO2 25 26 25 25 25 24   GLUCOSE 140* 121* 120* 116* 120* 106*  BUN 42* 38* 38* 40* 38* 39*  CREATININE 1.43* 1.43* 1.33* 1.34* 1.31* 1.31*  CALCIUM  9.0 8.9 8.7* 8.9 9.3 9.1  MG 2.1  --  1.9 2.2 2.3 2.4  PHOS 2.9  --  3.2 3.6 4.1 4.3   GFR: Estimated Creatinine Clearance: 74 mL/min (A) (by C-G formula based on SCr of 1.31 mg/dL (H)). Liver Function Tests: Recent Labs  Lab 01/25/24 0435 01/27/24 0316 01/28/24 0355 01/29/24 0330  AST 21  --   --  50*  ALT 12  --   --  58*  ALKPHOS 57  --   --  92  BILITOT 0.9  --   --  0.9  PROT 5.4*  --   --  6.2*  ALBUMIN  2.6* 2.6* 2.9* 3.0*   No results for input(s): LIPASE, AMYLASE in the last 168 hours. No results for input(s): AMMONIA in the last 168 hours. Coagulation Profile: No results for input(s): INR, PROTIME in the last 168 hours. Cardiac Enzymes: No results for input(s): CKTOTAL, CKMB, CKMBINDEX, TROPONINI in the last 168 hours. BNP (last 3 results) No results for input(s): PROBNP in the last 8760 hours. HbA1C: No results for input(s): HGBA1C in the last 72 hours. CBG: Recent Labs  Lab 01/26/24 1630 01/26/24 2357 01/27/24 0619 01/27/24 1811 01/27/24 2017  GLUCAP 101* 118* 123* 106* 107*   Lipid Profile: Recent Labs    01/29/24 0330  TRIG 103   Thyroid  Function Tests: No results for input(s): TSH, T4TOTAL, FREET4, T3FREE, THYROIDAB in the last 72 hours. Anemia Panel: No results for input(s): VITAMINB12, FOLATE, FERRITIN, TIBC, IRON , RETICCTPCT in the last 72 hours. Sepsis Labs: No results for input(s): PROCALCITON, LATICACIDVEN in the last 168 hours.  Recent Results (from the past 240 hours)  Culture,  blood (Routine X 2) w Reflex to ID Panel     Status: None   Collection Time: 01/26/24  2:24 PM   Specimen: BLOOD RIGHT HAND  Result Value Ref Range Status   Specimen Description BLOOD RIGHT HAND  Final   Special Requests   Final    BOTTLES DRAWN AEROBIC ONLY Blood Culture results may not be optimal due to an inadequate volume of blood received in culture bottles   Culture   Final    NO GROWTH 5 DAYS Performed at [facility], [address], [place], [place] [address]    Report Status 01/31/2024 FINAL  Final  Culture, blood (Routine X 2) w Reflex to ID Panel     Status: None   Collection Time: 01/26/24  2:27 PM   Specimen: BLOOD LEFT HAND  Result Value Ref Range Status   Specimen Description BLOOD LEFT HAND  Final   Special Requests   Final    BOTTLES DRAWN AEROBIC ONLY Blood Culture results may not be optimal due to an inadequate volume of blood received in culture bottles   Culture   Final    NO GROWTH 5 DAYS Performed at [facility], [address] [place], [place] [address]    Report Status 01/31/2024 FINAL  Final     Radiology Studies: No results found.  Scheduled Meds:  budesonide -glycopyrrolate -formoterol   2 puff Inhalation BID   carbamide peroxide  5 drop Right EAR BID   Chlorhexidine  Gluconate Cloth  6 each Topical Daily   feeding supplement  237 mL Oral BID BM   Gerhardt's butt cream   Topical BID   multivitamin with minerals  1 tablet Oral Daily   mouth rinse  15 mL Mouth Rinse TID   pantoprazole  (PROTONIX ) IV  40 mg Intravenous Q12H   Continuous Infusions:  heparin  2,200 Units/hr (01/31/24 1420)     Unresulted Labs (From admission, onward)     Start     Ordered   02/02/24 0500  Heparin  level (unfractionated)  Daily,   R     Question:  Specimen collection method  Answer:  IV Team=IV Team collect   01/31/24 2315   02/01/24 0700  Heparin  level (unfractionated)  Once-Timed,   TIMED       Question:  Specimen collection method  Answer:  IV  Team=IV Team collect   01/31/24 2315   02/01/24 0500  CBC  Tomorrow morning,   R  Question:  Specimen collection method  Answer:  IV Team=IV Team collect   01/31/24 2334   02/01/24 0500  Basic metabolic panel with GFR  Tomorrow morning,   R       Question:  Specimen collection method  Answer:  IV Team=IV Team collect   01/31/24 2334   02/01/24 0500  Magnesium   Tomorrow morning,   R       Question:  Specimen collection method  Answer:  IV Team=IV Team collect   01/31/24 2334   02/01/24 0500  Phosphorus  Tomorrow morning,   R       Question:  Specimen collection method  Answer:  IV Team=IV Team collect   01/31/24 2334             LOS:  LOS: 14 days   Time Spent: 45 minutes  Demetrice Combes Al-Sultani, MD [facility]  If 7PM-7AM, please contact night-coverage  01/31/2024, 11:18 PM      "

## 2024-01-31 NOTE — Plan of Care (Signed)
 " Problem: Education: Goal: Knowledge of General Education information will improve Description: Including pain rating scale, medication(s)/side effects and non-pharmacologic comfort measures 01/31/2024 0737 by Shela Lapine, RN Outcome: Progressing 01/31/2024 0734 by Shela Lapine, RN Outcome: Progressing   Problem: Health Behavior/Discharge Planning: Goal: Ability to manage health-related needs will improve 01/31/2024 0737 by Shela Lapine, RN Outcome: Progressing 01/31/2024 0734 by Shela Lapine, RN Outcome: Progressing   Problem: Clinical Measurements: Goal: Ability to maintain clinical measurements within normal limits will improve 01/31/2024 0737 by Shela Lapine, RN Outcome: Progressing 01/31/2024 0734 by Shela Lapine, RN Outcome: Progressing Goal: Will remain free from infection 01/31/2024 0737 by Shela Lapine, RN Outcome: Progressing 01/31/2024 0734 by Shela Lapine, RN Outcome: Progressing Goal: Diagnostic test results will improve 01/31/2024 0737 by Shela Lapine, RN Outcome: Progressing 01/31/2024 0734 by Shela Lapine, RN Outcome: Progressing Goal: Respiratory complications will improve 01/31/2024 0737 by Shela Lapine, RN Outcome: Progressing 01/31/2024 0734 by Shela Lapine, RN Outcome: Progressing Goal: Cardiovascular complication will be avoided 01/31/2024 0737 by Shela Lapine, RN Outcome: Progressing 01/31/2024 0734 by Shela Lapine, RN Outcome: Progressing   Problem: Activity: Goal: Risk for activity intolerance will decrease 01/31/2024 0737 by Shela Lapine, RN Outcome: Progressing 01/31/2024 0734 by Shela Lapine, RN Outcome: Progressing   Problem: Nutrition: Goal: Adequate nutrition will be maintained 01/31/2024 0737 by Shela Lapine, RN Outcome: Progressing 01/31/2024 0734 by Shela Lapine, RN Outcome: Progressing   Problem: Coping: Goal: Level of anxiety will decrease 01/31/2024 0737 by Shela Lapine, RN Outcome:  Progressing 01/31/2024 0734 by Shela Lapine, RN Outcome: Progressing   Problem: Elimination: Goal: Will not experience complications related to bowel motility 01/31/2024 0737 by Shela Lapine, RN Outcome: Progressing 01/31/2024 0734 by Shela Lapine, RN Outcome: Progressing Goal: Will not experience complications related to urinary retention 01/31/2024 0737 by Shela Lapine, RN Outcome: Progressing 01/31/2024 0734 by Shela Lapine, RN Outcome: Progressing   Problem: Pain Managment: Goal: General experience of comfort will improve and/or be controlled 01/31/2024 0737 by Shela Lapine, RN Outcome: Progressing 01/31/2024 0734 by Shela Lapine, RN Outcome: Progressing   Problem: Safety: Goal: Ability to remain free from injury will improve 01/31/2024 0737 by Shela Lapine, RN Outcome: Progressing 01/31/2024 0734 by Shela Lapine, RN Outcome: Progressing   Problem: Skin Integrity: Goal: Risk for impaired skin integrity will decrease 01/31/2024 0737 by Shela Lapine, RN Outcome: Progressing 01/31/2024 0734 by Shela Lapine, RN Outcome: Progressing   Problem: Education: Goal: Ability to describe self-care measures that may prevent or decrease complications (Diabetes Survival Skills Education) will improve 01/31/2024 0737 by Shela Lapine, RN Outcome: Progressing 01/31/2024 0734 by Shela Lapine, RN Outcome: Progressing Goal: Individualized Educational Video(s) 01/31/2024 0737 by Shela Lapine, RN Outcome: Progressing 01/31/2024 0734 by Shela Lapine, RN Outcome: Progressing   Problem: Coping: Goal: Ability to adjust to condition or change in health will improve 01/31/2024 0737 by Shela Lapine, RN Outcome: Progressing 01/31/2024 0734 by Shela Lapine, RN Outcome: Progressing   Problem: Fluid Volume: Goal: Ability to maintain a balanced intake and output will improve 01/31/2024 0737 by Shela Lapine, RN Outcome: Progressing 01/31/2024 0734 by  Shela Lapine, RN Outcome: Progressing   Problem: Health Behavior/Discharge Planning: Goal: Ability to identify and utilize available resources and services will improve 01/31/2024 0737 by Shela Lapine, RN Outcome: Progressing 01/31/2024 0734 by Shela Lapine, RN Outcome: Progressing Goal: Ability to manage health-related needs will improve 01/31/2024 0737 by Shela Lapine, RN Outcome: Progressing 01/31/2024 0734 by Shela Lapine, RN Outcome: Progressing   Problem: Metabolic: Goal: Ability to maintain appropriate  glucose levels will improve 01/31/2024 0737 by Shela Lapine, RN Outcome: Progressing 01/31/2024 0734 by Shela Lapine, RN Outcome: Progressing   Problem: Nutritional: Goal: Maintenance of adequate nutrition will improve 01/31/2024 0737 by Shela Lapine, RN Outcome: Progressing 01/31/2024 0734 by Shela Lapine, RN Outcome: Progressing Goal: Progress toward achieving an optimal weight will improve 01/31/2024 0737 by Shela Lapine, RN Outcome: Progressing 01/31/2024 0734 by Shela Lapine, RN Outcome: Progressing   Problem: Skin Integrity: Goal: Risk for impaired skin integrity will decrease 01/31/2024 0737 by Shela Lapine, RN Outcome: Progressing 01/31/2024 0734 by Shela Lapine, RN Outcome: Progressing   Problem: Tissue Perfusion: Goal: Adequacy of tissue perfusion will improve 01/31/2024 0737 by Shela Lapine, RN Outcome: Progressing 01/31/2024 0734 by Shela Lapine, RN Outcome: Progressing   "

## 2024-01-31 NOTE — Progress Notes (Signed)
 Physical Therapy Treatment Patient Details Name: Raymond Hurst MRN: [id number] DOB: 30-Apr-1950 Today's Date: 01/31/2024   History of Present Illness Pt is 73 yo initially presenting to [facility] then transferring to [facility] on 12/16 due to dizziness hematemesis. 12/17 pt with continuing ongoing blood loss anemia without recurrent hematemesis and was given 2 U PRBC. EGD with findings of duodenal active bleed which was clipped. Pt was intubated and admitted to ICU on 12/17 then transferred to [facility]. 12/19 ex lap abdominal wall closure with some bleeding from liver capsule controlled with cautery.  PMH includes afib, gout, GI bleed, stage IV lung cancer, alcohol abuse.    PT Comments  Pt is currently presenting at Min A for bed mobility, sit to stand and CGA for gait with RW. Pt requires occasional cues for safety and remains a high risk for falls. Pt does not have 24/7 assistance at home. Due to pt current functional status, home set up and available assistance at home recommending skilled physical therapy services < 3 hours/day in order to address strength, balance and functional mobility to decrease risk for falls, injury, immobility, skin break down and re-hospitalization.      If plan is discharge home, recommend the following: Assist for transportation;Assistance with cooking/housework   Can travel by private vehicle     No  Equipment Recommendations  BSC/3in1;Hospital bed       Precautions / Restrictions Precautions Precautions: Fall Recall of Precautions/Restrictions: Impaired Restrictions Weight Bearing Restrictions Per Provider Order: No     Mobility  Bed Mobility Overal bed mobility: Needs Assistance Bed Mobility: Supine to Sit     Supine to sit: Min assist     General bed mobility comments: MIn A with occasional cues for sequencing to get trunk to midline.    Transfers Overall transfer level: Needs assistance Equipment used: Rolling walker (2 wheels) Transfers: Sit to/from  Stand, Bed to chair/wheelchair/BSC Sit to Stand: Min assist, From elevated surface Stand pivot transfers: Min assist         General transfer comment: Pt needed min assist to rise from elevated bed and 1x cue for hand placement.    Ambulation/Gait Ambulation/Gait assistance: Contact guard assist Gait Distance (Feet): 100 Feet Assistive device: Rolling walker (2 wheels) Gait Pattern/deviations: Step-through pattern, Decreased stance time - left, Drifts right/left, Trunk flexed, Wide base of support, Decreased weight shift to left Gait velocity: decreased Gait velocity interpretation: <1.31 ft/sec, indicative of household ambulator   General Gait Details: Pt with improvement in gait today, decreased step length, mild drift to the R with mild trunk flexion; can correct briefly with cues; increased BOS with decreased stance time on the L      Balance Overall balance assessment: Needs assistance Sitting-balance support: No upper extremity supported, Feet supported Sitting balance-Leahy Scale: Fair     Standing balance support: Bilateral upper extremity supported, During functional activity, Reliant on assistive device for balance Standing balance-Leahy Scale: Poor Standing balance comment: CGA for safety      Communication Communication Communication: Impaired Factors Affecting Communication: Hearing impaired  Cognition Arousal: Alert Behavior During Therapy: WFL for tasks assessed/performed   PT - Cognitive impairments: No family/caregiver present to determine baseline, Safety/Judgement, Sequencing       Following commands: Impaired Following commands impaired: Follows multi-step commands with increased time    Cueing Cueing Techniques: Verbal cues, Tactile cues, Visual cues, Gestural cues     General Comments General comments (skin integrity, edema, etc.): vital signs stable during session  Pertinent Vitals/Pain Pain Assessment Pain Assessment: No/denies  pain Pain Intervention(s): Monitored during session     PT Goals (current goals can now be found in the care plan section) Acute Rehab PT Goals Patient Stated Goal: to improve mobiltiy PT Goal Formulation: With patient Time For Goal Achievement: 02/07/24 Potential to Achieve Goals: Fair Progress towards PT goals: Progressing toward goals    Frequency    Min 2X/week      PT Plan  Continue with current POC        AM-PAC PT 6 Clicks Mobility   Outcome Measure  Help needed turning from your back to your side while in a flat bed without using bedrails?: A Little Help needed moving from lying on your back to sitting on the side of a flat bed without using bedrails?: A Little Help needed moving to and from a bed to a chair (including a wheelchair)?: A Little Help needed standing up from a chair using your arms (e.g., wheelchair or bedside chair)?: A Little Help needed to walk in hospital room?: A Little Help needed climbing 3-5 steps with a railing? : Total 6 Click Score: 16    End of Session Equipment Utilized During Treatment: Gait belt Activity Tolerance: Patient tolerated treatment well Patient left: with call bell/phone within reach;in chair;with chair alarm set Nurse Communication: Mobility status PT Visit Diagnosis: Unsteadiness on feet (R26.81);Other abnormalities of gait and mobility (R26.89);Muscle weakness (generalized) (M62.81)     Time: 8490-8472 PT Time Calculation (min) (ACUTE ONLY): 18 min  Charges:    $Therapeutic Activity: 8-22 mins PT General Charges $$ ACUTE PT VISIT: 1 Visit                    Dorothyann Maier, DPT, CLT  Acute Rehabilitation Services Office: [phone] (Secure chat preferred)    Dorothyann VEAR Maier 01/31/2024, 4:15 PM

## 2024-01-31 NOTE — Progress Notes (Signed)
 12 Days Post-Op   Subjective/Chief Complaint: Tolerating soft diet without issues More BM yesterday Scant JP output No new labs No complaints of abdominal pain   Objective: Vital signs in last 24 hours: Temp:  [97.4 F (36.3 C)-98 F (36.7 C)] 98 F (36.7 C) (12/31 0727) Pulse Rate:  [83-89] 88 (12/31 0727) Resp:  [18-20] 18 (12/31 0727) BP: (124-132)/(77-87) 124/77 (12/31 0727) SpO2:  [96 %-99 %] 99 % (12/31 0727) Last BM Date : 01/30/24  Intake/Output from previous day: 12/30 0701 - 12/31 0700 In: 1517.7 [P.O.:600; I.V.:917.7] Out: 4783 [Urine:4783] Intake/Output this shift: No intake/output data recorded.  WDWN in NAD Abd - soft, non-tender Midline wound with clean granulation tissue JP - old dark blood - minimal output  Lab Results:  Recent Labs    01/29/24 1628 01/30/24 0323  WBC  --  8.7  HGB 15.3 15.1  HCT 48.3 46.1  PLT  --  152   BMET Recent Labs    01/29/24 0330 01/31/24 0312  NA 145 139  K 4.1 4.3  CL 109 104  CO2 25 24  GLUCOSE 120* 106*  BUN 38* 39*  CREATININE 1.31* 1.31*  CALCIUM  9.3 9.1   Studies/Results: VAS US  UPPER EXTREMITY VENOUS DUPLEX Result Date: 01/30/2024 UPPER VENOUS STUDY  Patient Name:  DVONTE GATLIFF  Date of Exam:   01/29/2024 Medical Rec #: [id number]       Accession #:    [id number] Date of Birth: April 16, 1950       Patient Gender: M Patient Age:   73 years Exam Location:  [facility] Procedure:      VAS US  UPPER EXTREMITY VENOUS DUPLEX Referring Phys: STEPHEN CHIU --------------------------------------------------------------------------------  Indications: Swelling Risk Factors: Cancer Lung. Limitations: R IJV Central Line. Performing Technologist: Garnette Rockers  Examination Guidelines: A complete evaluation includes B-mode imaging, spectral Doppler, color Doppler, and power Doppler as needed of all accessible portions of each vessel. Bilateral testing is considered an integral part of a complete examination. Limited  examinations for reoccurring indications may be performed as noted.  Right Findings: +----------+------------+---------+-----------+------------------+----------+ RIGHT     CompressiblePhasicitySpontaneous    Properties     Summary   +----------+------------+---------+-----------+------------------+----------+ IJV                                                         Obstructed +----------+------------+---------+-----------+------------------+----------+ Subclavian    Full       Yes       Yes                                 +----------+------------+---------+-----------+------------------+----------+ Axillary      Full       Yes       Yes                                 +----------+------------+---------+-----------+------------------+----------+ Brachial      None       No        No         retracted      Chronic   +----------+------------+---------+-----------+------------------+----------+ Radial        Full       Yes  Yes                                 +----------+------------+---------+-----------+------------------+----------+ Ulnar         Full       Yes       Yes                                 +----------+------------+---------+-----------+------------------+----------+ Cephalic      None       No        No     brightly echogenic  Acute    +----------+------------+---------+-----------+------------------+----------+ Basilic       Full                 Yes                                 +----------+------------+---------+-----------+------------------+----------+ Occlusive Chronic DVT seen in the distal portion of 1/2 Brachial Veins. Occlusive SVT seen through the full length of the Cephalic Vein.  Left Findings: +----------+------------+---------+-----------+----------+-------+ LEFT      CompressiblePhasicitySpontaneousPropertiesSummary +----------+------------+---------+-----------+----------+-------+ Subclavian                Yes       Yes                      +----------+------------+---------+-----------+----------+-------+  Summary:  Right: Findings consistent with acute superficial vein thrombosis involving the right cephalic vein. Findings consistent with chronic deep vein thrombosis involving the right brachial veins.  Left: No evidence of thrombosis in the subclavian.  *See table(s) above for measurements and observations.  Diagnosing physician: Fonda Rim Electronically signed by Fonda Rim on 01/30/2024 at 9:24:05 AM.    Final     Anti-infectives: Anti-infectives (From admission, onward)    Start     Dose/Rate Route Frequency Ordered Stop   01/29/24 2200  fluconazole  (DIFLUCAN ) IVPB 400 mg  Status:  Discontinued        400 mg 100 mL/hr over 120 Minutes Intravenous Every 24 hours 01/29/24 1336 01/29/24 1346   01/29/24 2200  fluconazole  (DIFLUCAN ) IVPB 400 mg  Status:  Discontinued        400 mg 200 mL/hr over 60 Minutes Intravenous Every 24 hours 01/29/24 1346 01/30/24 1533   01/25/24 1500  vancomycin  (VANCOREADY) IVPB 1500 mg/300 mL  Status:  Discontinued        1,500 mg 150 mL/hr over 120 Minutes Intravenous Every 24 hours 01/24/24 1322 01/30/24 1233   01/24/24 1115  vancomycin  (VANCOCIN ) 2,500 mg in sodium chloride  0.9 % 500 mL IVPB        2,500 mg 262.5 mL/hr over 120 Minutes Intravenous  Once 01/24/24 1029 01/24/24 1530   01/17/24 2245  piperacillin -tazobactam (ZOSYN ) IVPB 3.375 g  Status:  Discontinued        3.375 g 12.5 mL/hr over 240 Minutes Intravenous Every 8 hours 01/17/24 2149 01/30/24 1233   01/17/24 2200  fluconazole  (DIFLUCAN ) IVPB 400 mg  Status:  Discontinued        400 mg 100 mL/hr over 120 Minutes Intravenous Every 24 hours 01/17/24 2059 01/29/24 1336       Assessment/Plan: Duodenal ulcer hemorrhage  PMH EGD and IR coil embolization of gastric ulcer in August 2025 Presented to [facility] 12/16 w/ hematemesis, EGD  12/17 showed bleed from duodenal ulcer, clips  placed Transferred to [facility] for critical care services and IR consult Arrived in hemorrhagic shock, critical, MTP started, taken emergently to the OR for below:    POD 14 S/P LAPAROTOMY, EXPLORATORY, Duodenotomy, Ligation of GDA and branches, ABThera wound VAC placement 12/17 Dr. Rubin  POD 12 S/P exp lap, abdominal wall closure 12/19 Dr. Ebbie; some bleeding from liver capsule controlled with cautery and surgicel snow.    - UGI 12/26 w/o evidence of leak, JP output stable - Soft diet - Wean off TNA today - Remove JP drain -Continue dressing changes - Awaiting SNF placement - no new surgical issues   Per CCM: Hemorrhagic shock  Metastatic lung CA VDRF COPD OSA AKI  PMH CVA PMH afib on eliquis  - DOAC held in the setting of acute bleeding; OK to restart DOAC per primary team  LOS: 14 days    Raymond Hurst 01/31/2024

## 2024-01-31 NOTE — Progress Notes (Signed)
 JP removed per order, no bleeding noted

## 2024-01-31 NOTE — Plan of Care (Signed)

## 2024-01-31 NOTE — Progress Notes (Addendum)
 PHARMACY - TOTAL PARENTERAL NUTRITION CONSULT NOTE  Indication: Hemorrhagic perforated duodenal ulcer   Patient Measurements: Height: 6' 0.01 (182.9 cm) Weight: (!) 136.7 kg (301 lb 5.9 oz) IBW/kg (Calculated) : 77.62 TPN AdjBW (KG): 88.1 Body mass index is 40.86 kg/m. Usual Weight: around 117-119 kg > current weight of ~135 kg likely not accurate and elevated 2/2 acute illness, IVF and edema  Assessment:  69 YOM with PMH significant for Afib, CVA, COPD, lung cancer with mets, and multiple GIBs who is admitted with hematemesis after resuming apixaban  following recent admit 12/6-12/8 for melena. EGD on 12/8 showed non-bleeding duodenal ulcer with a clean ulcer base. He was transferred to [facility] for IR eval but not a candidate. He was taken to OR emergently for ex-lap duodenotomy and ligation of gastroduodenal artery. He is s/p MTP for duodenal hemorrhage requiring massive transfusion protocol, Kcentra , TXA and Factor VII. He has been NPO since surgery.  Pharmacy consulted to manage TPN.  Glucose / Insulin : A1c 5.5% - BG controlled off SSI Electrolytes: Na down to 138 (none in TPN), others WNL (Phos/Mag high normal after TPN halved) Renal:  SCr 1.31 (baseline 0.9), BUN 39 Hepatic: AST/ALT mildly elevated, alk phos / tbili / TG WNL, albumin 3 Intake / Output; MIVF:  UOP 1.5 mL/kg/hr, drain 0mL, LBM 12/30 x3 GI Imaging:  12/19 KUB: Embolization coils in upper abdomen  12/23 KUB: No bowel dilatation in the upper abdomen 12/26 UGI: No definite leakage or extravasation of contrast is noted. Large diverticulum is seen arising from third portion of duodenum GI Surgeries / Procedures:  12/17 EGD: red blood entire stomach, duodenal bulb, duodenum s/p hemostatic clip, hemospray  12/17 ex-lap with ligation of GDA and branches, VAC placement 12/19 abd wall closure, bleeding from liver capsule controlled with cautery and surgicel snow 12/25 NGT removed  Central access: CVC placed 01/17/24 TPN start  date: 01/21/24  Nutritional Goals: Goal TPN rate is 85 mL/hr (provides 120g AA and 2104 kCal per day)  RD Estimated Needs Total Energy Estimated Needs: 2100-2300 Total Protein Estimated Needs: 115-130g Total Fluid Estimated Needs: >/=2L  Current Nutrition:  TPN Soft diet - patient reports eating 40-60% of meals Ensure Plus High Protein BID - 1 charted given on 12/30  Plan:    D/C TPN per Surgery - reduce TPN to 25 ml/hr, then stop at 1800 Continue PO multivitamin daily D/C TPN labs and nursing care orders  Raymond Hurst D. [place], PharmD, BCPS, BCCCP 01/31/2024, 11:06 AM

## 2024-01-31 NOTE — TOC Progression Note (Signed)
 Transition of Care [facility]) - Progression Note    Patient Details  Name: Raymond Hurst MRN: [id number] Date of Birth: [birth year]  Transition of Care [facility]) CM/SW Contact  Lendia Dais, [place] Phone Number: 01/31/2024, 2:07 PM  Clinical Narrative:  Pt is not yet medically stable and being weaned off TPN.   CSW reached out to [facility] d/t preference. [facility] stated they cannot offer a bed for the pt while they are on a TPN.  CSW attempted to inform the wife via phone but no answer was received. CSW left a VM.  CSW will continue to follow.         Barriers to Discharge: Continued Medical Work up               Expected Discharge Plan and Services                                               Social Drivers of Health (SDOH) Interventions SDOH Screenings   Food Insecurity: No Food Insecurity (01/16/2024)  Housing: Low Risk (01/26/2024)  Transportation Needs: No Transportation Needs (01/16/2024)  Utilities: Not At Risk (01/16/2024)  Alcohol Screen: Low Risk (06/15/2023)  Depression (PHQ2-9): Low Risk (01/12/2024)  Financial Resource Strain: High Risk (06/15/2023)  Physical Activity: Inactive (06/15/2023)  Social Connections: Moderately Isolated (01/16/2024)  Stress: No Stress Concern Present (06/15/2023)  Tobacco Use: Medium Risk (01/19/2024)    Readmission Risk Interventions    09/08/2023   12:09 PM  Readmission Risk Prevention Plan  Transportation Screening Complete  HRI or Home Care Consult Complete  Social Work Consult for Recovery Care Planning/Counseling Complete  Palliative Care Screening Not Applicable  Medication Review Oceanographer) Complete

## 2024-01-31 NOTE — Progress Notes (Signed)
 PHARMACY - ANTICOAGULATION CONSULT NOTE  Pharmacy Consult for heparin   Indication: atrial fibrillation and DVT  Allergies[1]  Patient Measurements: Height: 6' 0.01 (182.9 cm) Weight: (!) 136.7 kg (301 lb 5.9 oz) IBW/kg (Calculated) : 77.62 HEPARIN  DW (KG): 103.8  Vital Signs: Temp: 98 F (36.7 C) (12/31 0727) Temp Source: Oral (12/31 0727) BP: 124/77 (12/31 0727) Pulse Rate: 88 (12/31 0727)  Labs: Recent Labs    01/29/24 0330 01/29/24 1628 01/30/24 0323 01/30/24 1945 01/31/24 0312 01/31/24 1345  HGB 15.5 15.3 15.1  --   --   --   HCT 48.8 48.3 46.1  --   --   --   PLT  --   --  152  --   --   --   APTT  --   --   --  50*  --   --   HEPARINUNFRC  --   --   --  0.11* 0.20* 0.29*  CREATININE 1.31*  --   --   --  1.31*  --     Estimated Creatinine Clearance: 74 mL/min (A) (by C-G formula based on SCr of 1.31 mg/dL (H)).   Medical History: Past Medical History:  Diagnosis Date   Adrenal tumor    right adrenal gland tumor favored to be a myelolipoma   Atrial fibrillation (HCC)    Cancer (HCC)    stage 4 Lung   Cellulitis of right lower leg    COPD (chronic obstructive pulmonary disease) (HCC)    Dysrhythmia    A-fib   GI bleed    History of TIA (transient ischemic attack)    May 2020   Lymphedema    OSA on CPAP    severe obstructive sleep apnea with an AHI of 54/h with no significant central events.  He had nocturnal hypoxemia with O2 sat nadir of 70%. Now on CPAP   Pulmonary mass    Stroke [facility])     Medications:  Medications Prior to Admission  Medication Sig Dispense Refill Last Dose/Taking   acetaminophen  (TYLENOL ) 500 MG tablet Take 500 mg by mouth every 6 (six) hours as needed for mild pain (pain score 1-3).   Unknown   allopurinol  (ZYLOPRIM ) 100 MG tablet Take 100 mg by mouth daily.   Past Week   apixaban  (ELIQUIS ) 5 MG TABS tablet Take 1 tablet (5 mg total) by mouth 2 (two) times daily. Okay to restart this medication on 08/29/2023   Past Week    budesonide -glycopyrrolate -formoterol  (BREZTRI  AEROSPHERE) 160-9-4.8 MCG/ACT AERO inhaler Inhale 2 puffs into the lungs in the morning and at bedtime. 3 each 4 Past Week   cholecalciferol  (VITAMIN D3) 25 MCG (1000 UNIT) tablet Take 1,000 Units by mouth daily.   Past Week   cyanocobalamin  (VITAMIN B12) 100 MCG tablet Take 100 mcg by mouth daily.   Past Week   folic acid  (FOLVITE ) 1 MG tablet TAKE 1 TABLET BY MOUTH EVERY DAY 90 tablet 3 Past Week   furosemide  (LASIX ) 20 MG tablet Take 10 mg by mouth daily.   Past Week   ipratropium-albuterol  (DUONEB) 0.5-2.5 (3) MG/3ML SOLN Take 3 mLs by nebulization every 6 (six) hours as needed. 360 mL 6 Unknown   loperamide  (IMODIUM  A-D) 2 MG tablet Take 2-4 mg by mouth 4 (four) times daily as needed for diarrhea or loose stools.   Unknown   metoprolol  tartrate (LOPRESSOR ) 25 MG tablet Take 1 tablet (25 mg total) by mouth 2 (two) times daily. 180 tablet 3 Past Week  thiamine  (VITAMIN B-1) 100 MG tablet Take 100 mg by mouth daily.   Past Week   pantoprazole  (PROTONIX ) 40 MG tablet TAKE 1 TABLET (40 MG TOTAL) BY MOUTH TWICE A DAY BEFORE MEALS 180 tablet 3     Assessment: [name] w/ Hx AF with an RUE DVT in need of AC. Apixaban  PTA. Last dose was >7 day ago. He was admitted to [facility] w/  duodenal ulcer hemorrhage which was clipped on 12/17. Hgb has been stable and wnl   12/31 PM update:  HL 0.29  No signs of bleeding / pauses w/ gtt  Goal of Therapy:  Heparin  level 0.3-0.5 units/mL Monitor platelets by anticoagulation protocol: Yes    Plan:  Increase heparin  infusion to 2200 units/hr Check anti-Xa level in 8 hours and daily while on heparin  Continue to monitor H&H and platelets   Rosellen Lichtenberger BS, PharmD, BCPS Clinical Pharmacist 01/31/2024 2:13 PM  Contact: [phone] after 3 PM     [1] No Known Allergies

## 2024-01-31 NOTE — Progress Notes (Signed)
 PHARMACY - ANTICOAGULATION CONSULT NOTE  Pharmacy Consult for heparin   Indication: atrial fibrillation and DVT  Allergies[1]  Patient Measurements: Height: 6' 0.01 (182.9 cm) Weight: (!) 136.7 kg (301 lb 5.9 oz) IBW/kg (Calculated) : 77.62 HEPARIN  DW (KG): 103.8  Vital Signs: Temp: 97.5 F (36.4 C) (12/30 2029) Temp Source: Oral (12/30 2029) BP: 132/81 (12/30 2029) Pulse Rate: 86 (12/30 2029)  Labs: Recent Labs    01/29/24 0330 01/29/24 1628 01/30/24 0323 01/30/24 1945 01/31/24 0312  HGB 15.5 15.3 15.1  --   --   HCT 48.8 48.3 46.1  --   --   PLT  --   --  152  --   --   APTT  --   --   --  50*  --   HEPARINUNFRC  --   --   --  0.11* 0.20*  CREATININE 1.31*  --   --   --  1.31*    Estimated Creatinine Clearance: 74 mL/min (A) (by C-G formula based on SCr of 1.31 mg/dL (H)).   Medical History: Past Medical History:  Diagnosis Date   Adrenal tumor    right adrenal gland tumor favored to be a myelolipoma   Atrial fibrillation (HCC)    Cancer (HCC)    stage 4 Lung   Cellulitis of right lower leg    COPD (chronic obstructive pulmonary disease) (HCC)    Dysrhythmia    A-fib   GI bleed    History of TIA (transient ischemic attack)    May 2020   Lymphedema    OSA on CPAP    severe obstructive sleep apnea with an AHI of 54/h with no significant central events.  He had nocturnal hypoxemia with O2 sat nadir of 70%. Now on CPAP   Pulmonary mass    Stroke [facility])     Medications:  Medications Prior to Admission  Medication Sig Dispense Refill Last Dose/Taking   acetaminophen  (TYLENOL ) 500 MG tablet Take 500 mg by mouth every 6 (six) hours as needed for mild pain (pain score 1-3).   Unknown   allopurinol  (ZYLOPRIM ) 100 MG tablet Take 100 mg by mouth daily.   Past Week   apixaban  (ELIQUIS ) 5 MG TABS tablet Take 1 tablet (5 mg total) by mouth 2 (two) times daily. Okay to restart this medication on 08/29/2023   Past Week   budesonide -glycopyrrolate -formoterol  (BREZTRI   AEROSPHERE) 160-9-4.8 MCG/ACT AERO inhaler Inhale 2 puffs into the lungs in the morning and at bedtime. 3 each 4 Past Week   cholecalciferol  (VITAMIN D3) 25 MCG (1000 UNIT) tablet Take 1,000 Units by mouth daily.   Past Week   cyanocobalamin  (VITAMIN B12) 100 MCG tablet Take 100 mcg by mouth daily.   Past Week   folic acid  (FOLVITE ) 1 MG tablet TAKE 1 TABLET BY MOUTH EVERY DAY 90 tablet 3 Past Week   furosemide  (LASIX ) 20 MG tablet Take 10 mg by mouth daily.   Past Week   ipratropium-albuterol  (DUONEB) 0.5-2.5 (3) MG/3ML SOLN Take 3 mLs by nebulization every 6 (six) hours as needed. 360 mL 6 Unknown   loperamide  (IMODIUM  A-D) 2 MG tablet Take 2-4 mg by mouth 4 (four) times daily as needed for diarrhea or loose stools.   Unknown   metoprolol  tartrate (LOPRESSOR ) 25 MG tablet Take 1 tablet (25 mg total) by mouth 2 (two) times daily. 180 tablet 3 Past Week   thiamine  (VITAMIN B-1) 100 MG tablet Take 100 mg by mouth daily.   Past Week  pantoprazole  (PROTONIX ) 40 MG tablet TAKE 1 TABLET (40 MG TOTAL) BY MOUTH TWICE A DAY BEFORE MEALS 180 tablet 3     Assessment: [name] w/ Hx AF with an RUE DVT in need of AC. Apixaban  PTA. Last dose was >7 day ago. He was admitted to [facility] w/  duodenal ulcer hemorrhage which was clipped on 12/17. Hgb has been stable and wnl   12/31 AM update:  Heparin  level sub-therapeutic   Goal of Therapy:  Heparin  level 0.3-0.5 units/mL Monitor platelets by anticoagulation protocol: Yes   Plan:  Increase heparin  infusion to 2100 units/hr Check anti-Xa level in 8 hours and daily while on heparin  Continue to monitor H&H and platelets  Raymond Hurst, PharmD, BCPS Clinical Pharmacist Phone: [phone]       [1] No Known Allergies

## 2024-02-01 DIAGNOSIS — D62 Acute posthemorrhagic anemia: Secondary | ICD-10-CM | POA: Diagnosis not present

## 2024-02-01 DIAGNOSIS — I82721 Chronic embolism and thrombosis of deep veins of right upper extremity: Secondary | ICD-10-CM

## 2024-02-01 DIAGNOSIS — I82611 Acute embolism and thrombosis of superficial veins of right upper extremity: Secondary | ICD-10-CM

## 2024-02-01 DIAGNOSIS — K922 Gastrointestinal hemorrhage, unspecified: Secondary | ICD-10-CM | POA: Diagnosis not present

## 2024-02-01 DIAGNOSIS — I482 Chronic atrial fibrillation, unspecified: Secondary | ICD-10-CM | POA: Diagnosis not present

## 2024-02-01 LAB — BASIC METABOLIC PANEL WITH GFR
Anion gap: 11 (ref 5–15)
BUN: 36 mg/dL — ABNORMAL HIGH (ref 8–23)
CO2: 24 mmol/L (ref 22–32)
Calcium: 9 mg/dL (ref 8.9–10.3)
Chloride: 104 mmol/L (ref 98–111)
Creatinine, Ser: 1.32 mg/dL — ABNORMAL HIGH (ref 0.61–1.24)
GFR, Estimated: 58 mL/min — ABNORMAL LOW
Glucose, Bld: 92 mg/dL (ref 70–99)
Potassium: 4.3 mmol/L (ref 3.5–5.1)
Sodium: 139 mmol/L (ref 135–145)

## 2024-02-01 LAB — CBC
HCT: 47.5 % (ref 39.0–52.0)
Hemoglobin: 15.2 g/dL (ref 13.0–17.0)
MCH: 29.3 pg (ref 26.0–34.0)
MCHC: 32 g/dL (ref 30.0–36.0)
MCV: 91.5 fL (ref 80.0–100.0)
Platelets: 179 K/uL (ref 150–400)
RBC: 5.19 MIL/uL (ref 4.22–5.81)
RDW: 15.7 % — ABNORMAL HIGH (ref 11.5–15.5)
WBC: 7.6 K/uL (ref 4.0–10.5)
nRBC: 0 % (ref 0.0–0.2)

## 2024-02-01 LAB — MAGNESIUM: Magnesium: 2.3 mg/dL (ref 1.7–2.4)

## 2024-02-01 LAB — PHOSPHORUS: Phosphorus: 4.2 mg/dL (ref 2.5–4.6)

## 2024-02-01 LAB — GLUCOSE, CAPILLARY: Glucose-Capillary: 110 mg/dL — ABNORMAL HIGH (ref 70–99)

## 2024-02-01 MED ORDER — APIXABAN 5 MG PO TABS
5.0000 mg | ORAL_TABLET | Freq: Two times a day (BID) | ORAL | Status: DC
  Administered 2024-02-01: 5 mg via ORAL
  Filled 2024-02-01: qty 1

## 2024-02-01 MED ORDER — PANTOPRAZOLE SODIUM 40 MG PO TBEC
40.0000 mg | DELAYED_RELEASE_TABLET | Freq: Two times a day (BID) | ORAL | Status: DC
  Administered 2024-02-01 – 2024-02-04 (×7): 40 mg via ORAL
  Filled 2024-02-01 (×7): qty 1

## 2024-02-01 MED ORDER — APIXABAN 5 MG PO TABS
5.0000 mg | ORAL_TABLET | Freq: Two times a day (BID) | ORAL | Status: DC
  Administered 2024-02-01 – 2024-02-03 (×4): 5 mg via ORAL
  Filled 2024-02-01 (×4): qty 1

## 2024-02-01 MED ORDER — HEPARIN (PORCINE) 25000 UT/250ML-% IV SOLN: 2350.0000 [IU]/h | INTRAVENOUS | Status: DC

## 2024-02-01 NOTE — Plan of Care (Signed)
 Pleasant and receptive to education. Wife has concerns about Eliquis . Pt said he would do whatever the doctor ordered. Initial dose of eliquis  taken but now d/c. Wife present now. Pt OOB to chair. Pt receptive to therapy/movement  Problem: Clinical Measurements: Goal: Ability to maintain clinical measurements within normal limits will improve Outcome: Progressing   Problem: Activity: Goal: Risk for activity intolerance will decrease Outcome: Progressing   Problem: Nutrition: Goal: Adequate nutrition will be maintained Outcome: Progressing

## 2024-02-01 NOTE — Progress Notes (Signed)
 " PROGRESS NOTE    Raymond Hurst  FMW:[id number] DOB: [birth year] DOA: 01/16/2024 PCP: [name], MD    Brief Narrative:  74 year old male Left lung mass bronchogenic CA (poorly differentiated squamous cell) diagnosed 06/05/2023-bronchoscopy 08/09/2023 complicated by left PNA thorax-follows with Dr. Arlester carboplatin  and Taxol --currently durvalumab  Permanent A-fib CHA2DS2-VASc >4 on Eliquis  Previous heavy EtOH UGIB 09/09/2023 cratered ulcer duodenum status post mesenteric angio/embolization @ [facility] 12/6-12/8 readmission--with upper GI bleed clean-based ulcer told to continue twice daily PPI manage resumed Eliquis  hemoglobin was 9.6 on discharge  Significant Events: 12/16 [facility] ED eval dizziness nausea-couple of bright red vomit-->?  ICU [facility]--> [address] [place] transfer 2/2 12/17 ICU [facility]-->  [facility] 2/2--IR duodenal bleed Clip ->?  Continued ongoing bleeding requiring pressors--- massive transfusion protocol-----emergent ex lap Dr. Rubin with duodenotomy ligation GDA branches VAC placement 12/19 Secondary closure of abdomen Dr. Ebbie  12/26 barium swallow performed    Assessment and Plan:  Duodenal ulcer hemorrhage with coiling 09/2023 Laparotomy 12/17, secondary closure 12/19 as above Vomiting resolved from 12/26 Off TPN on 12/31 JP drain removed 12/31 Diet being advanced by gen surg, now on soft diet Switched to PO PPI BID   Sepsis secondary to above  Staph epi bacteremia-MRSE with resistances -likely peritonitis Formally consulted ID on 12/30  Pt has completed course of antibiotics as of 12/30  RIJ CVC to be removed today  Atrial fibrillation  CHADVASC >4 Ok for DOAC per General Surgery, discussed with Dr. Belinda Per discussion with patient's wife, she is okay resumption of Eliquis  for now but would like to discuss with with cardiology. Briefly spoke to Dr. Kriste about this today, but will reengage cardiology tomorrow for an official consult.    Right cephalic vein acute SVT Right brachial vein acute DVT Noted on RUE doppler on 12/29 Heparin  drip was started Was transitioned to Eliquis  today for 1 dose this morning. It was later brought to attention, as noted above, that the patient's wife is very weary of starting Eliquis . He was transitioned back to heparin  drip after discussion with PharmD. Later, I was able to speak with the patient's wife at length today and explained the risks and benefits of anticoagulation, which is a complex decision given the patient's past and current medical problems. Ultimately, per our discussion over the phone, she conferred with the patient and they elected to resume Eliquis  for now, given surgery's clearance for resumption, until she speaks to cardiology. Therefore, the patient will be resumed on Eliquis  as of now, pending the family's discussion with cardiology.    Hypernatremia Thought to be secondary to free water  deficit Resolved   AKI on admission--- resolving -see above as well as fluid status Cr stable at 1.32, baseline Cr 0.7 - 0.9 Hypokalemia Resolved Urinary retention Foley was initially removed and patient was voiding well. However, noted to be retaining again today.  indwelling cath placed and will be kept at discharge. Will need outpt f/u with urology   Left lung mass bronchogenic CA Holding immunotherapy/chemotherapy Outpatient follow-up   Critical illness thrombocytopenia - resolved No schistocytes on smear review Plt 152 on 12/30    Scheduled Meds:  budesonide -glycopyrrolate -formoterol   2 puff Inhalation BID   carbamide peroxide  5 drop Right EAR BID   Chlorhexidine  Gluconate Cloth  6 each Topical Daily   feeding supplement  237 mL Oral BID BM   Gerhardt's butt cream   Topical BID   multivitamin with minerals  1 tablet Oral Daily  mouth rinse  15 mL Mouth Rinse TID   pantoprazole   40 mg Oral BID   Continuous Infusions:  heparin      PRN Meds:.acetaminophen ,  hydrALAZINE , HYDROcodone -acetaminophen , hydrOXYzine , ipratropium-albuterol , labetalol , ondansetron  (ZOFRAN ) IV, sodium chloride  flush  Current Outpatient Medications  Medication Instructions   acetaminophen  (TYLENOL ) 500 mg, Every 6 hours PRN   allopurinol  (ZYLOPRIM ) 100 mg, Daily   apixaban  (ELIQUIS ) 5 mg, Oral, 2 times daily, Okay to restart this medication on 08/29/2023   budesonide -glycopyrrolate -formoterol  (BREZTRI  AEROSPHERE) 160-9-4.8 MCG/ACT AERO inhaler 2 puffs, Inhalation, 2 times daily   cholecalciferol  (VITAMIN D3) 1,000 Units, Daily   cyanocobalamin  (VITAMIN B12) 100 mcg, Daily   folic acid  (FOLVITE ) 1 mg, Oral, Daily   furosemide  (LASIX ) 10 mg, Oral, Daily   ipratropium-albuterol  (DUONEB) 0.5-2.5 (3) MG/3ML SOLN 3 mLs, Nebulization, Every 6 hours PRN   loperamide  (IMODIUM  A-D) 2-4 mg, 4 times daily PRN   metoprolol  tartrate (LOPRESSOR ) 25 mg, Oral, 2 times daily   pantoprazole  (PROTONIX ) 40 MG tablet TAKE 1 TABLET (40 MG TOTAL) BY MOUTH TWICE A DAY BEFORE MEALS   thiamine  (VITAMIN B-1) 100 mg, Daily    DVT prophylaxis: SCDs Start: 01/16/24 1731   Code Status:   Code Status: Full Code  Family Communication: Attempted to call patient's wife Ms. Josiah once and left voicemail.  Attempted to call her three more times, voicemail was full. Finally, called again after being informed that she was now available by RN staff. Discussed the risks and benefits of Eliquis  as noted above.  Disposition Plan: Has been weaned off TPN and is medically ready for discharge to SNF PT - Follow Up Recommendations: Skilled nursing-short term rehab (<3 hours/day) - PT equipment: BSC/3in1, Hospital bed OT - Follow Up Recommendations: Skilled nursing-short term rehab (<3 hours/day) -    Level of care: Telemetry  Consultants:  General surgery, ID, GI, PCCM    Subjective: NAEO.  Was sitting in bedside chair.  Has no complaints.  Tolerating soft mechanical diet without any nausea or vomiting or  abdominal pain. Denies any hematochezia or melena.   Objective: Vitals:   01/31/24 1937 01/31/24 2317 02/01/24 0505 02/01/24 0900  BP: 129/84  131/87 119/77  Pulse: 70  81 63  Resp: 18  18 18   Temp: 97.6 F (36.4 C)  98 F (36.7 C) 97.8 F (36.6 C)  TempSrc:    Oral  SpO2: 97% 91% 90% 93%  Weight:      Height:        Intake/Output Summary (Last 24 hours) at 02/01/2024 1604 Last data filed at 02/01/2024 1235 Gross per 24 hour  Intake --  Output 4000 ml  Net -4000 ml   Filed Weights   01/22/24 0232 01/25/24 0452 01/29/24 0406  Weight: 135.7 kg 127.2 kg (!) 136.7 kg    Examination:  Gen: NAD, A&Ox3 HEENT: NCAT Neck: Supple, RIJ CVC  CV: RRR, no murmurs Resp: normal WOB, CTAB anteriorly Abd: Soft, NTND, no guarding, midline incision dressing c/d/i Ext: BLE swelling and chronic venous stasis changes Skin: Warm, dry Neuro: No focal deficits Psych: Calm, cooperative, appropriate affect    Data Reviewed: I have personally reviewed following labs and imaging studies  CBC: Recent Labs  Lab 01/28/24 1859 01/29/24 0330 01/29/24 1628 01/30/24 0323 02/01/24 0047  WBC  --   --   --  8.7 7.6  HGB 16.6 15.5 15.3 15.1 15.2  HCT 51.7 48.8 48.3 46.1 47.5  MCV  --   --   --  90.7 91.5  PLT  --   --   --  152 179   Basic Metabolic Panel: Recent Labs  Lab 01/27/24 0316 01/28/24 0355 01/29/24 0330 01/31/24 0312 02/01/24 0047  NA 145 146* 145 139 139  K 3.7 3.9 4.1 4.3 4.3  CL 112* 112* 109 104 104  CO2 25 25 25 24 24   GLUCOSE 120* 116* 120* 106* 92  BUN 38* 40* 38* 39* 36*  CREATININE 1.33* 1.34* 1.31* 1.31* 1.32*  CALCIUM  8.7* 8.9 9.3 9.1 9.0  MG 1.9 2.2 2.3 2.4 2.3  PHOS 3.2 3.6 4.1 4.3 4.2   GFR: Estimated Creatinine Clearance: 73.5 mL/min (A) (by C-G formula based on SCr of 1.32 mg/dL (H)). Liver Function Tests: Recent Labs  Lab 01/27/24 0316 01/28/24 0355 01/29/24 0330  AST  --   --  50*  ALT  --   --  58*  ALKPHOS  --   --  92  BILITOT  --   --   0.9  PROT  --   --  6.2*  ALBUMIN 2.6* 2.9* 3.0*   No results for input(s): LIPASE, AMYLASE in the last 168 hours. No results for input(s): AMMONIA in the last 168 hours. Coagulation Profile: No results for input(s): INR, PROTIME in the last 168 hours. Cardiac Enzymes: No results for input(s): CKTOTAL, CKMB, CKMBINDEX, TROPONINI in the last 168 hours. BNP (last 3 results) No results for input(s): PROBNP in the last 8760 hours. HbA1C: No results for input(s): HGBA1C in the last 72 hours. CBG: Recent Labs  Lab 01/26/24 1630 01/26/24 2357 01/27/24 0619 01/27/24 1811 01/27/24 2017  GLUCAP 101* 118* 123* 106* 107*   Lipid Profile: No results for input(s): CHOL, HDL, LDLCALC, TRIG, CHOLHDL, LDLDIRECT in the last 72 hours.  Thyroid  Function Tests: No results for input(s): TSH, T4TOTAL, FREET4, T3FREE, THYROIDAB in the last 72 hours. Anemia Panel: No results for input(s): VITAMINB12, FOLATE, FERRITIN, TIBC, IRON , RETICCTPCT in the last 72 hours. Sepsis Labs: No results for input(s): PROCALCITON, LATICACIDVEN in the last 168 hours.  Recent Results (from the past 240 hours)  Culture, blood (Routine X 2) w Reflex to ID Panel     Status: None   Collection Time: 01/26/24  2:24 PM   Specimen: BLOOD RIGHT HAND  Result Value Ref Range Status   Specimen Description BLOOD RIGHT HAND  Final   Special Requests   Final    BOTTLES DRAWN AEROBIC ONLY Blood Culture results may not be optimal due to an inadequate volume of blood received in culture bottles   Culture   Final    NO GROWTH 5 DAYS Performed at [facility], [address] [place], [place] [address]    Report Status 01/31/2024 FINAL  Final  Culture, blood (Routine X 2) w Reflex to ID Panel     Status: None   Collection Time: 01/26/24  2:27 PM   Specimen: BLOOD LEFT HAND  Result Value Ref Range Status   Specimen Description BLOOD LEFT HAND  Final   Special  Requests   Final    BOTTLES DRAWN AEROBIC ONLY Blood Culture results may not be optimal due to an inadequate volume of blood received in culture bottles   Culture   Final    NO GROWTH 5 DAYS Performed at [facility], [address] [place], [place] [address]    Report Status 01/31/2024 FINAL  Final     Radiology Studies: No results found.  Scheduled Meds:  budesonide -glycopyrrolate -formoterol   2 puff Inhalation BID   carbamide peroxide  5 drop Right EAR BID   Chlorhexidine  Gluconate Cloth  6 each Topical Daily   feeding supplement  237 mL Oral BID BM   Gerhardt's butt cream   Topical BID   multivitamin with minerals  1 tablet Oral Daily   mouth rinse  15 mL Mouth Rinse TID   pantoprazole   40 mg Oral BID   Continuous Infusions:  heparin        Unresulted Labs (From admission, onward)     Start     Ordered   02/02/24 0600  Heparin  level (unfractionated)  Once-Timed,   R       Question:  Specimen collection method  Answer:  IV Team=IV Team collect   02/01/24 1439   02/02/24 0600  APTT  Once-Timed,   R       Question:  Specimen collection method  Answer:  IV Team=IV Team collect   02/01/24 1439             LOS:  LOS: 15 days   Time Spent: 55 minutes  Amyah Clawson Al-Sultani, MD [facility]  If 7PM-7AM, please contact night-coverage  02/01/2024, 4:04 PM      "

## 2024-02-01 NOTE — Progress Notes (Addendum)
 PHARMACY - ANTICOAGULATION CONSULT NOTE  Pharmacy Consult for heparin   Indication: atrial fibrillation and chronic RUE DVT  Allergies[1]  Patient Measurements: Height: 6' 0.01 (182.9 cm) Weight: (!) 136.7 kg (301 lb 5.9 oz) IBW/kg (Calculated) : 77.62 HEPARIN  DW (KG): 103.8  Vital Signs: Temp: 97.8 F (36.6 C) (01/01 0900) Temp Source: Oral (01/01 0900) BP: 119/77 (01/01 0900) Pulse Rate: 63 (01/01 0900)  Labs: Recent Labs    01/29/24 1628 01/30/24 0323 01/30/24 1945 01/30/24 1945 01/31/24 0312 01/31/24 1345 01/31/24 2118 02/01/24 0047  HGB 15.3 15.1  --   --   --   --   --  15.2  HCT 48.3 46.1  --   --   --   --   --  47.5  PLT  --  152  --   --   --   --   --  179  APTT  --   --  50*  --   --   --   --   --   HEPARINUNFRC  --   --  0.11*   < > 0.20* 0.29* 0.22*  --   CREATININE  --   --   --   --  1.31*  --   --  1.32*   < > = values in this interval not displayed.    Estimated Creatinine Clearance: 73.5 mL/min (A) (by C-G formula based on SCr of 1.32 mg/dL (H)).   Assessment: 45 YOM w/ Hx AF with an RUE DVT in need of AC. Apixaban  PTA. Last dose was >7 day ago. [facility] w/  duodenal ulcer hemorrhage which was clipped on 12/17. Hgb has been stable and wnl   1/1: Transitioned to apixaban  this morning and had 1 dose at 09:20. RN expressed wife had concerns about resuming apixaban . I spoke with her via phone. She was unaware he also had a chronic RUE DVT in addition to being on anticoagulation for Afib. She would like to involve Cardiology in the decision making process. Updated Dr Mosie. In the mean time, Dr Al-Sultani and I decided to resume IV heparin  until a decision can be made on apixiban going forward.  Goal of Therapy:  Heparin  level 0.3-0.5 units/mL Monitor platelets by anticoagulation protocol: Yes   Plan:  Discontinue apixaban  Resume heparin  infusion at 2350 units/hr at 22:00 with no bolus Check anti-Xa level and aPTT in 8 hours and  daily while on heparin  Continue to monitor H&H and platelets  Thank you for involving pharmacy in this patient's care.  Delon Sax, PharmD, BCPS Clinical Pharmacist Clinical phone for 02/01/2024 is [phone] 02/01/2024 2:23 PM       [1] No Known Allergies

## 2024-02-01 NOTE — TOC Progression Note (Signed)
 Transition of Care [facility]) - Progression Note    Patient Details  Name: Raymond Hurst MRN: [id number] Date of Birth: [birth year]  [facility]) CM/SW Contact  Luise JAYSON Pan, [place] Phone Number: 02/01/2024, 4:05 PM  Clinical Narrative:   Per MD, patient off TPN. CSW left VM for Kristen at [facility] about reviewing patient referral.  CSW will continue to follow.     Barriers to Discharge: Continued Medical Work up               Expected Discharge Plan and Services                                               Social Drivers of Health (SDOH) Interventions SDOH Screenings   Food Insecurity: No Food Insecurity (01/16/2024)  Housing: Low Risk (01/26/2024)  Transportation Needs: No Transportation Needs (01/16/2024)  Utilities: Not At Risk (01/16/2024)  Alcohol Screen: Low Risk (06/15/2023)  Depression (PHQ2-9): Low Risk (01/12/2024)  Financial Resource Strain: High Risk (06/15/2023)  Physical Activity: Inactive (06/15/2023)  Social Connections: Moderately Isolated (01/16/2024)  Stress: No Stress Concern Present (06/15/2023)  Tobacco Use: Medium Risk (01/19/2024)    Readmission Risk Interventions    09/08/2023   12:09 PM  Readmission Risk Prevention Plan  Transportation Screening Complete  HRI or Home Care Consult Complete  Social Work Consult for Recovery Care Planning/Counseling Complete  Palliative Care Screening Not Applicable  Medication Review Oceanographer) Complete

## 2024-02-01 NOTE — Progress Notes (Signed)
 13 Days Post-Op   Subjective/Chief Complaint: No new complaints Weaned off TPN Drain removed yesterday WBC normal   Objective: Vital signs in last 24 hours: Temp:  [97.6 F (36.4 C)-98 F (36.7 C)] 97.8 F (36.6 C) (01/01 0900) Pulse Rate:  [63-81] 63 (01/01 0900) Resp:  [18] 18 (01/01 0900) BP: (119-131)/(77-93) 119/77 (01/01 0900) SpO2:  [90 %-97 %] 93 % (01/01 0900) Last BM Date : 01/30/24  Intake/Output from previous day: 12/31 0701 - 01/01 0700 In: 208.6 [I.V.:208.6] Out: 4050 [Urine:4050] Intake/Output this shift: Total I/O In: -  Out: 550 [Urine:550]  WDWN in NAD Abd - soft, non-tender Drain site - clean Midline wound - almost filled with granulation tissue  Lab Results:  Recent Labs    01/30/24 0323 02/01/24 0047  WBC 8.7 7.6  HGB 15.1 15.2  HCT 46.1 47.5  PLT 152 179   BMET Recent Labs    01/31/24 0312 02/01/24 0047  NA 139 139  K 4.3 4.3  CL 104 104  CO2 24 24  GLUCOSE 106* 92  BUN 39* 36*  CREATININE 1.31* 1.32*  CALCIUM  9.1 9.0   PT/INR No results for input(s): LABPROT, INR in the last 72 hours. ABG No results for input(s): PHART, HCO3 in the last 72 hours.  Invalid input(s): PCO2, PO2  Studies/Results: No results found.  Anti-infectives: Anti-infectives (From admission, onward)    Start     Dose/Rate Route Frequency Ordered Stop   01/29/24 2200  fluconazole  (DIFLUCAN ) IVPB 400 mg  Status:  Discontinued        400 mg 100 mL/hr over 120 Minutes Intravenous Every 24 hours 01/29/24 1336 01/29/24 1346   01/29/24 2200  fluconazole  (DIFLUCAN ) IVPB 400 mg  Status:  Discontinued        400 mg 200 mL/hr over 60 Minutes Intravenous Every 24 hours 01/29/24 1346 01/30/24 1533   01/25/24 1500  vancomycin  (VANCOREADY) IVPB 1500 mg/300 mL  Status:  Discontinued        1,500 mg 150 mL/hr over 120 Minutes Intravenous Every 24 hours 01/24/24 1322 01/30/24 1233   01/24/24 1115  vancomycin  (VANCOCIN ) 2,500 mg in sodium chloride  0.9  % 500 mL IVPB        2,500 mg 262.5 mL/hr over 120 Minutes Intravenous  Once 01/24/24 1029 01/24/24 1530   01/17/24 2245  piperacillin -tazobactam (ZOSYN ) IVPB 3.375 g  Status:  Discontinued        3.375 g 12.5 mL/hr over 240 Minutes Intravenous Every 8 hours 01/17/24 2149 01/30/24 1233   01/17/24 2200  fluconazole  (DIFLUCAN ) IVPB 400 mg  Status:  Discontinued        400 mg 100 mL/hr over 120 Minutes Intravenous Every 24 hours 01/17/24 2059 01/29/24 1336       Assessment/Plan: Duodenal ulcer hemorrhage  PMH EGD and IR coil embolization of gastric ulcer in August 2025 Presented to [facility] 12/16 w/ hematemesis, EGD 12/17 showed bleed from duodenal ulcer, clips placed Transferred to [facility] for critical care services and IR consult Arrived in hemorrhagic shock, critical, MTP started, taken emergently to the OR for below:    POD 15 S/P LAPAROTOMY, EXPLORATORY, Duodenotomy, Ligation of GDA and branches, ABThera wound VAC placement 12/17 Dr. Rubin  POD 14 S/P exp lap, abdominal wall closure 12/19 Dr. Ebbie; some bleeding from liver capsule controlled with cautery and surgicel snow.    - UGI 12/26 w/o evidence of leak, JP output stable - Soft diet - Wean off TNA today - Remove JP  drain -Continue dressing changes - Awaiting SNF placement - no new surgical issues   Per CCM: Hemorrhagic shock  Metastatic lung CA VDRF COPD OSA AKI  PMH CVA PMH afib on eliquis  - DOAC held in the setting of acute bleeding; OK to restart DOAC per primary team  Surgery will follow periodically to check wound until patient discharged to SNF.  No acute surgical issues.    LOS: 15 days    Raymond Hurst 02/01/2024

## 2024-02-02 ENCOUNTER — Other Ambulatory Visit: Payer: Self-pay | Admitting: Oncology

## 2024-02-02 ENCOUNTER — Other Ambulatory Visit: Payer: Self-pay

## 2024-02-02 DIAGNOSIS — I482 Chronic atrial fibrillation, unspecified: Secondary | ICD-10-CM | POA: Diagnosis not present

## 2024-02-02 DIAGNOSIS — I4821 Permanent atrial fibrillation: Secondary | ICD-10-CM | POA: Diagnosis not present

## 2024-02-02 DIAGNOSIS — K922 Gastrointestinal hemorrhage, unspecified: Secondary | ICD-10-CM | POA: Diagnosis not present

## 2024-02-02 DIAGNOSIS — I82721 Chronic embolism and thrombosis of deep veins of right upper extremity: Secondary | ICD-10-CM | POA: Diagnosis not present

## 2024-02-02 DIAGNOSIS — D62 Acute posthemorrhagic anemia: Secondary | ICD-10-CM | POA: Diagnosis not present

## 2024-02-02 LAB — BASIC METABOLIC PANEL WITH GFR
Anion gap: 12 (ref 5–15)
BUN: 33 mg/dL — ABNORMAL HIGH (ref 8–23)
CO2: 22 mmol/L (ref 22–32)
Calcium: 8.9 mg/dL (ref 8.9–10.3)
Chloride: 103 mmol/L (ref 98–111)
Creatinine, Ser: 1.32 mg/dL — ABNORMAL HIGH (ref 0.61–1.24)
GFR, Estimated: 58 mL/min — ABNORMAL LOW
Glucose, Bld: 120 mg/dL — ABNORMAL HIGH (ref 70–99)
Potassium: 4 mmol/L (ref 3.5–5.1)
Sodium: 137 mmol/L (ref 135–145)

## 2024-02-02 LAB — CBC
HCT: 47.2 % (ref 39.0–52.0)
Hemoglobin: 15.7 g/dL (ref 13.0–17.0)
MCH: 29.8 pg (ref 26.0–34.0)
MCHC: 33.3 g/dL (ref 30.0–36.0)
MCV: 89.7 fL (ref 80.0–100.0)
Platelets: 193 K/uL (ref 150–400)
RBC: 5.26 MIL/uL (ref 4.22–5.81)
RDW: 15.6 % — ABNORMAL HIGH (ref 11.5–15.5)
WBC: 7.4 K/uL (ref 4.0–10.5)
nRBC: 0 % (ref 0.0–0.2)

## 2024-02-02 NOTE — Consult Note (Addendum)
 "  Cardiology Consultation   Patient ID: Raymond Hurst MRN: [id number]; DOB: 1950/06/14  Admit date: 01/16/2024 Date of Consult: 02/02/2024  PCP:  [name], MD   [facility] Providers Cardiologist:  Jayson Sierras, MD        Patient Profile: Raymond Hurst is a 74 y.o. male with a hx of permanent atrial fibrillation, recent diagnosis of NSCLC -squamous cell carcinoma, lymphedema, GI bleed and prior TIA who is being seen 02/02/2024 for the evaluation of chronic anticoagulation therapy at the request of Anmar Al-Sultani MD.  History of Present Illness: [name] was recently diagnosed with squamous cell carcinoma 06/2023. He did have one episode of AF RVR in the setting of medications being held prior to his bronchoscopy. He was last seen by cardiology 08/10/23 for routine 3 month follow up. At that time he was stable from a cardiac standpoint. He has chronic lymphedema and baseline DOE. He has only been able to tolerate lasix  20 mg as he could not tolerate increased urination with higher doses.   Since that visit:  Admitted 09/2023 with GI bleed that required 5 units of blood transfusion. During that admission underwent EGD that showed a deep cratered ulcer in the duodenum. He ultimately underwent IR embolization. He was resumed on eliquis .  Admitted 01/06/2024 with GI bleed, EGD showed gastritis and clean base duodenum ulcer without active bleed. No transfusion required. Eliquis  was resumed.   Current admission 01/16/24 for GI bleed. EGD was pursued, showed active bleed from duodenal ulcer, clips placed. He was transferred to [facility] for hemorrhagic shock requiring MTP and was taken to the OR for emergent ex-lap. C/b bacteremia with staph epi, right brachial DVT and cephalic vein SVT. Per general surgery okay to resume eliquis . Family requested further discussion with cardiology about chronic anticoagulation.   Hgb has remained stable ~15 for > week and has tolerated IV heparin  for  several days. Now transitioned to eliqius.   On interview, patient shared he has been okay from a cardiac standpoint. Denied chest pain or shortness of breath. Occasionally notices palpitations. Peripheral edema at baseline.   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  He had nocturnal hypoxemia with O2 sat nadir of 70%. Now on CPAP   Pulmonary mass    Stroke [facility])     Past Surgical History:  Procedure Laterality Date   BRONCHIAL BIOPSY  06/12/2023   Procedure: BRONCHOSCOPY, WITH BIOPSY;  Surgeon: Shelah Lamar RAMAN, MD;  Location: [facility] ENDOSCOPY;  Service: Cardiopulmonary;;   BRONCHIAL BRUSHINGS  06/12/2023   Procedure: BRONCHOSCOPY, WITH BRUSH BIOPSY;  Surgeon: Shelah Lamar RAMAN, MD;  Location: [facility] ENDOSCOPY;  Service: Cardiopulmonary;;   BRONCHIAL NEEDLE ASPIRATION BIOPSY  06/12/2023   Procedure: BRONCHOSCOPY, WITH NEEDLE ASPIRATION BIOPSY;  Surgeon: Shelah Lamar RAMAN, MD;  Location: [facility] ENDOSCOPY;  Service: Cardiopulmonary;;   BRONCHIAL NEEDLE ASPIRATION BIOPSY  08/28/2023   Procedure: BRONCHOSCOPY, WITH NEEDLE ASPIRATION BIOPSY;  Surgeon: Shelah Lamar RAMAN, MD;  Location: [facility] ENDOSCOPY;  Service: Pulmonary;;   CRYOTHERAPY  08/28/2023   Procedure: CHANA;  Surgeon: Shelah Lamar RAMAN, MD;  Location: [facility] ENDOSCOPY;  Service: Pulmonary;;   ESOPHAGOGASTRODUODENOSCOPY N/A 09/09/2023   Procedure: EGD (ESOPHAGOGASTRODUODENOSCOPY);  Surgeon: Shaaron Lamar HERO, MD;  Location: [facility] ENDO SUITE;  Service: Endoscopy;  Laterality: N/A;   ESOPHAGOGASTRODUODENOSCOPY N/A 01/08/2024   Procedure:  EGD (ESOPHAGOGASTRODUODENOSCOPY);  Surgeon: Cindie Carlin POUR, DO;  Location: [facility] ENDO SUITE;  Service: Endoscopy;  Laterality: N/A;   ESOPHAGOGASTRODUODENOSCOPY N/A 01/17/2024   Procedure: EGD (ESOPHAGOGASTRODUODENOSCOPY);  Surgeon: Cindie Carlin POUR, DO;  Location: [facility] ENDO SUITE;  Service: Endoscopy;  Laterality: N/A;   HEMOSTASIS CLIP PLACEMENT  06/12/2023   Procedure: CONTROL OF HEMORRHAGE bronch cold saline and epi;  Surgeon: Shelah Lamar RAMAN, MD;  Location: [facility] ENDOSCOPY;  Service: Cardiopulmonary;;   HEMOSTASIS CLIP PLACEMENT  01/17/2024   Procedure: CONTROL OF HEMORRHAGE, GI TRACT, ENDOSCOPIC, BY CLIPPING OR OVERSEWING;  Surgeon: Cindie Carlin POUR, DO;  Location: [facility] ENDO SUITE;  Service: Endoscopy;;   INNER EAR SURGERY     IR ANGIOGRAM SELECTIVE EACH ADDITIONAL VESSEL  09/09/2023   IR ANGIOGRAM VISCERAL SELECTIVE  09/09/2023   IR ANGIOGRAM VISCERAL SELECTIVE  09/09/2023   IR EMBO ART  VEN HEMORR LYMPH EXTRAV  INC GUIDE ROADMAPPING  09/09/2023   IR THORACENTESIS ASP PLEURAL SPACE W/IMG GUIDE  09/26/2023   IR US  GUIDE VASC ACCESS RIGHT  09/09/2023   KNEE SURGERY Left    LAPAROTOMY N/A 01/17/2024   Procedure: LAPAROTOMY, EXPLORATORY;  Surgeon: Rubin Calamity, MD;  Location: [facility] OR;  Service: General;  Laterality: N/A;   LAPAROTOMY N/A 01/19/2024   Procedure: MACARIO LIVINGS;  Surgeon: Ebbie Cough, MD;  Location: [facility] OR;  Service: General;  Laterality: N/A;  POSSIBLE ABDOMINAL CLOSURE   THORACENTESIS Left 06/06/2023   Procedure: MILANA;  Surgeon: Arlinda Ranks, MD;  Location: [facility] ENDOSCOPY;  Service: Pulmonary;  Laterality: Left;   TONSILLECTOMY     VIDEO BRONCHOSCOPY WITH ENDOBRONCHIAL NAVIGATION Left 08/28/2023   Procedure: VIDEO BRONCHOSCOPY WITH ENDOBRONCHIAL NAVIGATION;  Surgeon: Shelah Lamar RAMAN, MD;  Location: [facility] ENDOSCOPY;  Service: Pulmonary;  Laterality: Left;   VIDEO BRONCHOSCOPY WITH ENDOBRONCHIAL ULTRASOUND Left 06/12/2023   Procedure: BRONCHOSCOPY, WITH EBUS;  Surgeon:  Shelah Lamar RAMAN, MD;  Location: [facility] ENDOSCOPY;  Service: Cardiopulmonary;  Laterality: Left;   VIDEO BRONCHOSCOPY WITH ENDOBRONCHIAL ULTRASOUND  08/28/2023   Procedure: BRONCHOSCOPY, WITH EBUS;  Surgeon: Shelah Lamar RAMAN, MD;  Location: [facility] ENDOSCOPY;  Service: Pulmonary;;       Scheduled Meds:  apixaban   5 mg Oral BID   budesonide -glycopyrrolate -formoterol   2 puff Inhalation BID   Chlorhexidine  Gluconate Cloth  6 each Topical Daily   feeding supplement  237 mL Oral BID BM   Gerhardt's butt cream   Topical BID   multivitamin with minerals  1 tablet Oral Daily   mouth rinse  15 mL Mouth Rinse TID   pantoprazole   40 mg Oral BID   Continuous Infusions:  PRN Meds: acetaminophen , hydrALAZINE , HYDROcodone -acetaminophen , hydrOXYzine , ipratropium-albuterol , labetalol , ondansetron  (ZOFRAN ) IV, sodium chloride  flush  Allergies:   Allergies[1]  Social History:   Social History   Socioeconomic History   Marital status: Married    Spouse name: Not on file   Number of children: Not on file   Years of education: Not on file   Highest education level: Not on file  Occupational History   Not on file  Tobacco Use   Smoking status:  Former    Current packs/day: 0.00    Average packs/day: 2.0 packs/day for 20.0 years (40.0 ttl pk-yrs)    Types: Cigarettes    Start date: 01/31/1970    Quit date: 01/31/1990    Years since quitting: 34.0   Smokeless tobacco: Never  Vaping Use   Vaping status: Never Used  Substance and Sexual Activity   Alcohol use: Not Currently    Comment: 4-5 beer daily   Drug use: No   Sexual activity: Not on file  Other Topics Concern   Not on file  Social History Narrative   Right handed.    Social Drivers of Health   Tobacco Use: Medium Risk (01/19/2024)   Patient History    Smoking Tobacco Use: Former    Smokeless Tobacco Use: Never    Passive Exposure: Not on file  Financial Resource Strain: High Risk (06/15/2023)   Overall Financial Resource Strain (CARDIA)     Difficulty of Paying Living Expenses: Very hard  Food Insecurity: No Food Insecurity (01/16/2024)   Epic    Worried About Programme Researcher, Broadcasting/film/video in the Last Year: Never true    Ran Out of Food in the Last Year: Never true  Transportation Needs: No Transportation Needs (01/16/2024)   Epic    Lack of Transportation (Medical): No    Lack of Transportation (Non-Medical): No  Physical Activity: Inactive (06/15/2023)   Exercise Vital Sign    Days of Exercise per Week: 0 days    Minutes of Exercise per Session: 0 min  Stress: No Stress Concern Present (06/15/2023)   [employer] of Occupational Health - Occupational Stress Questionnaire    Feeling of Stress : Only a little  Social Connections: Moderately Isolated (01/16/2024)   Social Connection and Isolation Panel    Frequency of Communication with Friends and Family: More than three times a week    Frequency of Social Gatherings with Friends and Family: More than three times a week    Attends Religious Services: Never    Database Administrator or Organizations: No    Attends Banker Meetings: Never    Marital Status: Married  Catering Manager Violence: Not At Risk (01/16/2024)   Epic    Fear of Current or Ex-Partner: No    Emotionally Abused: No    Physically Abused: No    Sexually Abused: No  Depression (PHQ2-9): Low Risk (01/12/2024)   Depression (PHQ2-9)    PHQ-2 Score: 0  Alcohol Screen: Low Risk (06/15/2023)   Alcohol Screen    Last Alcohol Screening Score (AUDIT): 5  Housing: Low Risk (01/26/2024)   Epic    Unable to Pay for Housing in the Last Year: No    Number of Times Moved in the Last Year: 0    Homeless in the Last Year: No  Utilities: Not At Risk (01/16/2024)   Epic    Threatened with loss of utilities: No  Health Literacy: Not on file    Family History:   Family History  Problem Relation Age of Onset   Heart failure Mother    COPD Mother    Atrial fibrillation Mother    Idiopathic pulmonary  fibrosis Father      ROS:  Please see the history of present illness.  All other ROS reviewed and negative.     Physical Exam/Data: Vitals:   02/01/24 1831 02/01/24 1951 02/02/24 0506 02/02/24 0804  BP:  125/80 138/66 131/86  Pulse:  92 96 76  Resp:  18 18 20   Temp: (!) 97.5 F (36.4 C) (!) 97.3 F (36.3 C) 97.7 F (36.5 C) 99.6 F (37.6 C)  TempSrc: Axillary Oral Oral Oral  SpO2:  94% 96% 96%  Weight:      Height:        Intake/Output Summary (Last 24 hours) at 02/02/2024 1237 Last data filed at 02/02/2024 0805 Gross per 24 hour  Intake 890 ml  Output 3300 ml  Net -2410 ml      01/29/2024    4:06 AM 01/25/2024    4:52 AM 01/22/2024    2:32 AM  Last 3 Weights  Weight (lbs) 301 lb 5.9 oz 280 lb 6.8 oz 299 lb 2.6 oz  Weight (kg) 136.7 kg 127.2 kg 135.7 kg     Body mass index is 40.86 kg/m.  General:  Pleasant older gentlemen in no acute distress HEENT: normal Vascular:  Distal pulses 2+ bilaterally Cardiac: Irregularly, irregular, no murmur Lungs:  clear to auscultation bilaterally, no wheezing, rhonchi or rales  Ext: Trace pitting edema, overlying skin changes to shins, non tender, cool to touch Psych:  Normal affect   EKG:  The EKG was personally reviewed and demonstrates:  12/18 AF VR 48 TWI V1-V6, low voltage Telemetry:  Telemetry was personally reviewed and demonstrates:  AF HR ~80-110  Relevant CV Studies: Echocardiogram 05/2019 IMPRESSIONS     1. Left ventricular ejection fraction, by estimation, is 55 to 60%. The  left ventricle has normal function. The left ventricle has no regional  wall motion abnormalities. There is mild left ventricular hypertrophy.  Left ventricular diastolic parameters  are indeterminate.   2. Right ventricular systolic function is normal. The right ventricular  size is normal.   3. The mitral valve is normal in structure. Trivial mitral valve  regurgitation. No evidence of mitral stenosis.   4. The aortic valve has an  indeterminant number of cusps. Aortic valve  regurgitation is not visualized. No aortic stenosis is present.   5. Aortic dilatation noted. There is mild to moderate dilatation of the  aortic root measuring 43 mm.   6. Indeterminant PASP, inadequate TR jet.   7. The inferior vena cava is normal in size with greater than 50%  respiratory variability, suggesting right atrial pressure of 3 mmHg.   Laboratory Data:   Chemistry Recent Labs  Lab 01/29/24 0330 01/31/24 0312 02/01/24 0047 02/02/24 0326  NA 145 139 139 137  K 4.1 4.3 4.3 4.0  CL 109 104 104 103  CO2 25 24 24 22   GLUCOSE 120* 106* 92 120*  BUN 38* 39* 36* 33*  CREATININE 1.31* 1.31* 1.32* 1.32*  CALCIUM  9.3 9.1 9.0 8.9  MG 2.3 2.4 2.3  --   GFRNONAA 58* 58* 58* 58*  ANIONGAP 11 10 11 12     Recent Labs  Lab 01/27/24 0316 01/28/24 0355 01/29/24 0330  PROT  --   --  6.2*  ALBUMIN 2.6* 2.9* 3.0*  AST  --   --  50*  ALT  --   --  58*  ALKPHOS  --   --  92  BILITOT  --   --  0.9   Lipids  Recent Labs  Lab 01/29/24 0330  TRIG 103    Hematology Recent Labs  Lab 01/30/24 0323 02/01/24 0047 02/02/24 0326  WBC 8.7 7.6 7.4  RBC 5.08 5.19 5.26  HGB 15.1 15.2 15.7  HCT 46.1 47.5 47.2  MCV 90.7 91.5 89.7  MCH 29.7 29.3 29.8  MCHC 32.8 32.0 33.3  RDW 15.8* 15.7* 15.6*  PLT 152 179 193    Radiology/Studies:  VAS US  UPPER EXTREMITY VENOUS DUPLEX Result Date: 01/30/2024 UPPER VENOUS STUDY  Patient Name:  CARSTEN CARSTARPHEN  Date of Exam:   01/29/2024 Medical Rec #: [id number]       Accession #:    [id number] Date of Birth: 08-02-1950       Patient Gender: M Patient Age:   89 years Exam Location:  [facility] Procedure:      VAS US  UPPER EXTREMITY VENOUS DUPLEX Referring Phys: STEPHEN CHIU --------------------------------------------------------------------------------  Indications: Swelling Risk Factors: Cancer Lung. Limitations: R IJV Central Line. Performing Technologist: Garnette Rockers  Examination Guidelines:  A complete evaluation includes B-mode imaging, spectral Doppler, color Doppler, and power Doppler as needed of all accessible portions of each vessel. Bilateral testing is considered an integral part of a complete examination. Limited examinations for reoccurring indications may be performed as noted.  Right Findings: +----------+------------+---------+-----------+------------------+----------+ RIGHT     CompressiblePhasicitySpontaneous    Properties     Summary   +----------+------------+---------+-----------+------------------+----------+ IJV                                                         Obstructed +----------+------------+---------+-----------+------------------+----------+ Subclavian    Full       Yes       Yes                                 +----------+------------+---------+-----------+------------------+----------+ Axillary      Full       Yes       Yes                                 +----------+------------+---------+-----------+------------------+----------+ Brachial      None       No        No         retracted      Chronic   +----------+------------+---------+-----------+------------------+----------+ Radial        Full       Yes       Yes                                 +----------+------------+---------+-----------+------------------+----------+ Ulnar         Full       Yes       Yes                                 +----------+------------+---------+-----------+------------------+----------+ Cephalic      None       No        No     brightly echogenic  Acute    +----------+------------+---------+-----------+------------------+----------+ Basilic       Full                 Yes                                 +----------+------------+---------+-----------+------------------+----------+ Occlusive Chronic  DVT seen in the distal portion of 1/2 Brachial Veins. Occlusive SVT seen through the full length of the Cephalic Vein.  Left  Findings: +----------+------------+---------+-----------+----------+-------+ LEFT      CompressiblePhasicitySpontaneousPropertiesSummary +----------+------------+---------+-----------+----------+-------+ Subclavian               Yes       Yes                      +----------+------------+---------+-----------+----------+-------+  Summary:  Right: Findings consistent with acute superficial vein thrombosis involving the right cephalic vein. Findings consistent with chronic deep vein thrombosis involving the right brachial veins.  Left: No evidence of thrombosis in the subclavian.  *See table(s) above for measurements and observations.  Diagnosing physician: Fonda Rim Electronically signed by Fonda Rim on 01/30/2024 at 9:24:05 AM.    Final      Assessment and Plan:  Permanent Atrial Fibrillation Currently in AF Chad Vas score 4 which indicates a annual stroke risk of 4.8%  Complex situation, patient with recent GI bleeding requiring blood transfusion and urgent ex-lap for source control, however in permanent AF with current peripheral thromboses.  In the future could consider watchman procedure, though will need to recover from recent GI bleeds and remain stable on chronic anticoagulation.   Will discuss with MD, though most likely favor for chronic anticoagulation. Patient has remained stable on anticoagulation for over a week and general surgery okay with restarting.   Consider starting metoprolol  succinate 25 mg now that BP has improved  Lymphedema On exam, trace edema, patient reports at baseline.  Okay to pursue echocardiogram outpatient as originally planned.   OSA  Continue CPAP  Risk Stratification Lipid panel in the am   Per primary GI bleed with hemorrhagic shock  Sepsis/bacteremia/peritonitis Lung cancer Electrolyte distrubances AKI Urinary retention thrombocytopenia  Risk Assessment/Risk Scores:     CHA2DS2-VASc Score = 4   This indicates a 4.8%  annual risk of stroke. The patient's score is based upon: CHF History: 0 HTN History: 0 Diabetes History: 0 Stroke History: 2 Vascular Disease History: 1 Age Score: 1 Gender Score: 0        For questions or updates, please contact [facility] Please consult [email or web address] for contact info under      Signed, Leontine LOISE Salen, PA-C  02/02/2024 12:37 PM  Patient seen and examined  I agree with findings as noted by L LOckwood above  Pt is a 73 yo with permanent atrial fibrillation   In Spring 2020 was diagnosed with afib   Had a TIA at the time (R eye visual loss, transient)  He has been on anticoagulation   (CHADS Vasc score is 37 (age, HTN, TIA, CAD on CT) In Aug had GI bleed   EGD showed duodenal ulcer  Embolized. Jan 06 2024 GI bleed  EGD with gastritis and duodenal ulcer  ELiquis  resumed Dec 16   GI bleed   Active bleeding from duodenal ulcer.  Clips placed  Complicated by shock   Underwent Exp lap    He as been on IV heparin  and tolerated with Hgb staying stable  Currently comfortable laying in bed Neck  JVP is normal Lungs are CTA Cardiac Irreg irreg  No S3  No murmurs Abd is supple  Ext without edema  As stated above, the pt's risk for TIA/CVA per year is 4.8%      RUE USN also shows superficial and chronic deep  vein thromboses. Another indication for long term anticoagulation  Pt and family are very reluctant to anticoagulate given alll that he has been through    Understandable  Has tolerated heparin    Just started on ELiquis   Would review with surgery ongoing risk for GI bleeding   Long term, may be candidate for Watchman device   This however is not an acute solution and would require a work up and at least short term anticoagulation    Watchman would not solve problem with  chronic DVT in arm.(? Attrib to hospitalizations  in past year )  Keep on ELiquis  for now   Will need to be followed closely as outpt  2.  LIpids   With atherosclerosis on CT should  be on statin  LIpids have been ordered      Will sign off for now   Pt will have appt scheduled in [place] V[place] Gull MD      [1] No Known Allergies  "

## 2024-02-02 NOTE — Progress Notes (Signed)
 14 Days Post-Op   Subjective/Chief Complaint: Tol diet, no complaints, has bowel function   Objective: Vital signs in last 24 hours: Temp:  [97.3 F (36.3 C)-97.8 F (36.6 C)] 97.7 F (36.5 C) (01/02 0506) Pulse Rate:  [63-96] 96 (01/02 0506) Resp:  [18-20] 18 (01/02 0506) BP: (119-138)/(66-80) 138/66 (01/02 0506) SpO2:  [93 %-97 %] 96 % (01/02 0506) Last BM Date : 02/01/24  Intake/Output from previous day: 01/01 0701 - 01/02 0700 In: 840 [P.O.:840] Out: 4250 [Urine:4250] Intake/Output this shift: No intake/output data recorded.  Ab soft wound healing well approp tender  Lab Results:  Recent Labs    02/01/24 0047 02/02/24 0326  WBC 7.6 7.4  HGB 15.2 15.7  HCT 47.5 47.2  PLT 179 193   BMET Recent Labs    02/01/24 0047 02/02/24 0326  NA 139 137  K 4.3 4.0  CL 104 103  CO2 24 22  GLUCOSE 92 120*  BUN 36* 33*  CREATININE 1.32* 1.32*  CALCIUM  9.0 8.9   PT/INR No results for input(s): LABPROT, INR in the last 72 hours. ABG No results for input(s): PHART, HCO3 in the last 72 hours.  Invalid input(s): PCO2, PO2  Studies/Results: No results found.  Anti-infectives: Anti-infectives (From admission, onward)    Start     Dose/Rate Route Frequency Ordered Stop   01/29/24 2200  fluconazole  (DIFLUCAN ) IVPB 400 mg  Status:  Discontinued        400 mg 100 mL/hr over 120 Minutes Intravenous Every 24 hours 01/29/24 1336 01/29/24 1346   01/29/24 2200  fluconazole  (DIFLUCAN ) IVPB 400 mg  Status:  Discontinued        400 mg 200 mL/hr over 60 Minutes Intravenous Every 24 hours 01/29/24 1346 01/30/24 1533   01/25/24 1500  vancomycin  (VANCOREADY) IVPB 1500 mg/300 mL  Status:  Discontinued        1,500 mg 150 mL/hr over 120 Minutes Intravenous Every 24 hours 01/24/24 1322 01/30/24 1233   01/24/24 1115  vancomycin  (VANCOCIN ) 2,500 mg in sodium chloride  0.9 % 500 mL IVPB        2,500 mg 262.5 mL/hr over 120 Minutes Intravenous  Once 01/24/24 1029 01/24/24  1530   01/17/24 2245  piperacillin -tazobactam (ZOSYN ) IVPB 3.375 g  Status:  Discontinued        3.375 g 12.5 mL/hr over 240 Minutes Intravenous Every 8 hours 01/17/24 2149 01/30/24 1233   01/17/24 2200  fluconazole  (DIFLUCAN ) IVPB 400 mg  Status:  Discontinued        400 mg 100 mL/hr over 120 Minutes Intravenous Every 24 hours 01/17/24 2059 01/29/24 1336       Assessment/Plan: POD 16 S/P LAPAROTOMY, EXPLORATORY, Duodenotomy, Ligation of GDA and branches, ABThera wound VAC placement Dr. Rubin  POD 14 S/P exp lap, abdominal wall closure Dr. Ebbie; some bleeding from liver capsule controlled with cautery and surgicel snow.    - UGI 12/26 w/o evidence of leak, JP output stable - Soft diet -Continue dressing changes - Awaiting SNF placement - no new surgical issues   Per CCM: Hemorrhagic shock  Metastatic lung CA VDRF COPD OSA AKI  PMH CVA PMH afib on eliquis  - DOAC held in the setting of acute bleeding; OK to restart DOAC per primary team   Will sign off today, please call with any issues, will put dc appt in chart  Raymond Hurst 02/02/2024

## 2024-02-02 NOTE — Progress Notes (Signed)
 " PROGRESS NOTE    Raymond Hurst  FMW:[id number] DOB: [birth year] DOA: 01/16/2024 PCP: [name], MD    Brief Narrative:  The patient is a 74 year old male with PMHx of metastatic lung adenocarcinoma (LUL, PD-L1 20%) s/p carbo/taxol  + XRT (06-09/2023) on durvalumab , AFib on Eliquis , COPD, gout, OSA on CPAP, chronic lymphedema, previous alcohol abuse, UGIB 2/2 cratered duodenal ulcer s/p GDA embolization (09/09/2023), recurrent UGIB 2/2 gastric ulcer, who presented to [facility] on 01/16/2024 complaining of dizziness, nausea, and hematemesis, admitted for ABLA and hypotension secondary to acute UGIB s/p transfusion of 3 units pRBC, underwent EGD (12/17) which showed duodenal bulb bleeding possible from coil erosion s/p Hemospray. The patient was kept intubated and transferred to [facility] for IR eval. While in the ICU on 12/17, he went into hemorrhagic shock and required pressors and MTP, Kcentra , TXA, and factor 7. Underwent emergent ex-lap with duodenotomy and ligation of GDA with wound vac placement. Hgb and BP stabilized and he was off pressors. Underwent secondary closure of abdomen on 12/19 with JP drain placement. Extubated on 12/23. Transferred out of ICU on 12/26.   Assessment and Plan:  Duodenal ulcer hemorrhage with coiling 09/2023 Laparotomy 12/17, secondary closure 12/19 as above Vomiting resolved from 12/26 Off TPN on 12/31 JP drain removed 12/31 Diet being advanced by gen surg, now on soft diet Switched to PO PPI BID   Sepsis secondary to above  Staph epi bacteremia-MRSE with resistances -likely peritonitis Bcx 12/21 postive for staph epi Bcx 12/26 no growth Formally consulted ID on 12/30  Pt has completed course of antibiotics as of 12/30  RIJ CVC removed today  Atrial fibrillation  CHADVASC >4 Ok for DOAC per General Surgery, discussed with Dr. Belinda Patient's family discussed with cardiology today, agreed to continue Eliquis  at this time. Please see cardiology consult note  from 02/02/2024.  Continue Eliquis  5 mg BID  Right cephalic vein acute SVT Right brachial vein acute DVT Noted on RUE doppler on 12/29 Continue Eliquis  5 mg BID   Hypernatremia Thought to be secondary to free water  deficit Resolved   AKI on admission--- resolving -see above as well as fluid status Cr stable at 1.32, baseline Cr 0.7 - 0.9 Hypokalemia Resolved Urinary retention Foley was initially removed and patient was voiding well. However, noted to be retaining again today.  indwelling cath placed and will be kept at discharge. Will need outpt f/u with urology   Left lung mass bronchogenic CA Holding immunotherapy/chemotherapy Outpatient follow-up   Critical illness thrombocytopenia - resolved No schistocytes on smear review     Scheduled Meds:  apixaban   5 mg Oral BID   budesonide -glycopyrrolate -formoterol   2 puff Inhalation BID   Chlorhexidine  Gluconate Cloth  6 each Topical Daily   feeding supplement  237 mL Oral BID BM   Gerhardt's butt cream   Topical BID   multivitamin with minerals  1 tablet Oral Daily   mouth rinse  15 mL Mouth Rinse TID   pantoprazole   40 mg Oral BID   Continuous Infusions:   PRN Meds:.acetaminophen , hydrALAZINE , HYDROcodone -acetaminophen , hydrOXYzine , ipratropium-albuterol , labetalol , ondansetron  (ZOFRAN ) IV, sodium chloride  flush  Current Outpatient Medications  Medication Instructions   acetaminophen  (TYLENOL ) 500 mg, Every 6 hours PRN   allopurinol  (ZYLOPRIM ) 100 mg, Daily   apixaban  (ELIQUIS ) 5 mg, Oral, 2 times daily, Okay to restart this medication on 08/29/2023   budesonide -glycopyrrolate -formoterol  (BREZTRI  AEROSPHERE) 160-9-4.8 MCG/ACT AERO inhaler 2 puffs, Inhalation, 2 times daily   cholecalciferol  (VITAMIN D3) 1,000 Units, Daily  cyanocobalamin  (VITAMIN B12) 100 mcg, Daily   folic acid  (FOLVITE ) 1 mg, Oral, Daily   furosemide  (LASIX ) 10 mg, Oral, Daily   ipratropium-albuterol  (DUONEB) 0.5-2.5 (3) MG/3ML SOLN 3 mLs,  Nebulization, Every 6 hours PRN   loperamide  (IMODIUM  A-D) 2-4 mg, 4 times daily PRN   metoprolol  tartrate (LOPRESSOR ) 25 mg, Oral, 2 times daily   pantoprazole  (PROTONIX ) 40 MG tablet TAKE 1 TABLET (40 MG TOTAL) BY MOUTH TWICE A DAY BEFORE MEALS   thiamine  (VITAMIN B-1) 100 mg, Daily    DVT prophylaxis: SCDs Start: 01/16/24 1731 apixaban  (ELIQUIS ) tablet 5 mg   Code Status:   Code Status: Full Code  Family Communication: Attempted to call patient's wife Ms. Josiah once and left voicemail.  Attempted to call her three more times, voicemail was full. Finally, called again after being informed that she was now available by RN staff. Discussed the risks and benefits of Eliquis  as noted above.  Disposition Plan: Has been weaned off TPN and is medically ready for discharge to SNF. Pending bed.  PT - Follow Up Recommendations: Skilled nursing-short term rehab (<3 hours/day) - PT equipment: BSC/3in1, Hospital bed OT - Follow Up Recommendations: Skilled nursing-short term rehab (<3 hours/day) -    Level of care: Telemetry  Consultants:  General surgery, ID, GI, PCCM    Subjective: NAEO.  Was sitting in bedside chair.  Has no complaints.  Tolerating soft mechanical diet without any nausea or vomiting or abdominal pain. Denies any hematochezia or melena. Wanting to talk to cardiology about Eliquis .    Objective: Vitals:   02/02/24 0506 02/02/24 0804 02/02/24 1639 02/02/24 2004  BP: 138/66 131/86 132/75 130/72  Pulse: 96 76 84 86  Resp: 18 20 20 18   Temp: 97.7 F (36.5 C) 99.6 F (37.6 C) (!) 97.5 F (36.4 C) 97.7 F (36.5 C)  TempSrc: Oral Oral Oral   SpO2: 96% 96% 96% 94%  Weight:      Height:        Intake/Output Summary (Last 24 hours) at 02/02/2024 2034 Last data filed at 02/02/2024 2000 Gross per 24 hour  Intake 1290 ml  Output 4150 ml  Net -2860 ml   Filed Weights   01/22/24 0232 01/25/24 0452 01/29/24 0406  Weight: 135.7 kg 127.2 kg (!) 136.7 kg     Examination:  Gen: NAD, A&Ox3 HEENT: NCAT Neck: Supple, RIJ CVC was still in place this morning CV: RRR, no murmurs Resp: normal WOB, CTAB anteriorly Abd: Soft, NTND, no guarding, midline incision dressing c/d/i Ext: BLE swelling and chronic venous stasis changes Skin: Warm, dry Neuro: No focal deficits Psych: Calm, cooperative, appropriate affect    Data Reviewed: I have personally reviewed following labs and imaging studies  CBC: Recent Labs  Lab 01/29/24 0330 01/29/24 1628 01/30/24 0323 02/01/24 0047 02/02/24 0326  WBC  --   --  8.7 7.6 7.4  HGB 15.5 15.3 15.1 15.2 15.7  HCT 48.8 48.3 46.1 47.5 47.2  MCV  --   --  90.7 91.5 89.7  PLT  --   --  152 179 193   Basic Metabolic Panel: Recent Labs  Lab 01/27/24 0316 01/28/24 0355 01/29/24 0330 01/31/24 0312 02/01/24 0047 02/02/24 0326  NA 145 146* 145 139 139 137  K 3.7 3.9 4.1 4.3 4.3 4.0  CL 112* 112* 109 104 104 103  CO2 25 25 25 24 24 22   GLUCOSE 120* 116* 120* 106* 92 120*  BUN 38* 40* 38* 39* 36* 33*  CREATININE 1.33* 1.34* 1.31* 1.31* 1.32* 1.32*  CALCIUM  8.7* 8.9 9.3 9.1 9.0 8.9  MG 1.9 2.2 2.3 2.4 2.3  --   PHOS 3.2 3.6 4.1 4.3 4.2  --    GFR: Estimated Creatinine Clearance: 73.5 mL/min (A) (by C-G formula based on SCr of 1.32 mg/dL (H)). Liver Function Tests: Recent Labs  Lab 01/27/24 0316 01/28/24 0355 01/29/24 0330  AST  --   --  50*  ALT  --   --  58*  ALKPHOS  --   --  92  BILITOT  --   --  0.9  PROT  --   --  6.2*  ALBUMIN 2.6* 2.9* 3.0*   No results for input(s): LIPASE, AMYLASE in the last 168 hours. No results for input(s): AMMONIA in the last 168 hours. Coagulation Profile: No results for input(s): INR, PROTIME in the last 168 hours. Cardiac Enzymes: No results for input(s): CKTOTAL, CKMB, CKMBINDEX, TROPONINI in the last 168 hours. BNP (last 3 results) No results for input(s): PROBNP in the last 8760 hours. HbA1C: No results for input(s): HGBA1C in  the last 72 hours. CBG: Recent Labs  Lab 01/26/24 2357 01/27/24 0619 01/27/24 1811 01/27/24 2017 02/01/24 1953  GLUCAP 118* 123* 106* 107* 110*   Lipid Profile: No results for input(s): CHOL, HDL, LDLCALC, TRIG, CHOLHDL, LDLDIRECT in the last 72 hours.  Thyroid  Function Tests: No results for input(s): TSH, T4TOTAL, FREET4, T3FREE, THYROIDAB in the last 72 hours. Anemia Panel: No results for input(s): VITAMINB12, FOLATE, FERRITIN, TIBC, IRON , RETICCTPCT in the last 72 hours. Sepsis Labs: No results for input(s): PROCALCITON, LATICACIDVEN in the last 168 hours.  Recent Results (from the past 240 hours)  Culture, blood (Routine X 2) w Reflex to ID Panel     Status: None   Collection Time: 01/26/24  2:24 PM   Specimen: BLOOD RIGHT HAND  Result Value Ref Range Status   Specimen Description BLOOD RIGHT HAND  Final   Special Requests   Final    BOTTLES DRAWN AEROBIC ONLY Blood Culture results may not be optimal due to an inadequate volume of blood received in culture bottles   Culture   Final    NO GROWTH 5 DAYS Performed at [facility], [address] [place], [place] [address]    Report Status 01/31/2024 FINAL  Final  Culture, blood (Routine X 2) w Reflex to ID Panel     Status: None   Collection Time: 01/26/24  2:27 PM   Specimen: BLOOD LEFT HAND  Result Value Ref Range Status   Specimen Description BLOOD LEFT HAND  Final   Special Requests   Final    BOTTLES DRAWN AEROBIC ONLY Blood Culture results may not be optimal due to an inadequate volume of blood received in culture bottles   Culture   Final    NO GROWTH 5 DAYS Performed at [facility], [address] [place], [place] [address]    Report Status 01/31/2024 FINAL  Final     Radiology Studies: No results found.  Scheduled Meds:  apixaban   5 mg Oral BID   budesonide -glycopyrrolate -formoterol   2 puff Inhalation BID   Chlorhexidine  Gluconate Cloth  6 each  Topical Daily   feeding supplement  237 mL Oral BID BM   Gerhardt's butt cream   Topical BID   multivitamin with minerals  1 tablet Oral Daily   mouth rinse  15 mL Mouth Rinse TID   pantoprazole   40 mg  Oral BID   Continuous Infusions:     Unresulted Labs (From admission, onward)     Start     Ordered   02/03/24 0500  Lipid panel  Tomorrow morning,   R       Question:  Specimen collection method  Answer:  IV Team=IV Team collect   02/02/24 1355             LOS:  LOS: 16 days   Time Spent: 55 minutes  Reginna Sermeno Al-Sultani, MD [facility]  If 7PM-7AM, please contact night-coverage  02/02/2024, 8:34 PM      "

## 2024-02-02 NOTE — TOC Progression Note (Signed)
 Transition of Care [facility]) - Progression Note    Patient Details  Name: Raymond Hurst MRN: [id number] Date of Birth: 10-16-1950  Transition of Care [facility]) CM/SW Contact  Lendia Dais, [place] Phone Number: 02/02/2024, 4:13 PM  Clinical Narrative:  CSW attempted to reach out to [place] twice with no answer and left a message. Pt will not be able to discharge over the weekend per Kristin on Wednesday  12/31.  CSW will continue to follow for possible discharge Monday.      Barriers to Discharge: Continued Medical Work up               Expected Discharge Plan and Services                                               Social Drivers of Health (SDOH) Interventions SDOH Screenings   Food Insecurity: No Food Insecurity (01/16/2024)  Housing: Low Risk (01/26/2024)  Transportation Needs: No Transportation Needs (01/16/2024)  Utilities: Not At Risk (01/16/2024)  Alcohol Screen: Low Risk (06/15/2023)  Depression (PHQ2-9): Low Risk (01/12/2024)  Financial Resource Strain: High Risk (06/15/2023)  Physical Activity: Inactive (06/15/2023)  Social Connections: Moderately Isolated (01/16/2024)  Stress: No Stress Concern Present (06/15/2023)  Tobacco Use: Medium Risk (01/19/2024)    Readmission Risk Interventions    09/08/2023   12:09 PM  Readmission Risk Prevention Plan  Transportation Screening Complete  HRI or Home Care Consult Complete  Social Work Consult for Recovery Care Planning/Counseling Complete  Palliative Care Screening Not Applicable  Medication Review Oceanographer) Complete

## 2024-02-02 NOTE — Progress Notes (Signed)
 Occupational Therapy Treatment Patient Details Name: Raymond Hurst MRN: [id number] DOB: [birth year] Today's Date: 02/02/2024   History of present illness Pt is 74 yo initially presenting to [facility] then transferring to [facility] on 12/16 due to dizziness hematemesis. 12/17 pt with continuing ongoing blood loss anemia without recurrent hematemesis and was given 2 U PRBC. EGD with findings of duodenal active bleed which was clipped. Pt was intubated and admitted to ICU on 12/17 then transferred to [facility]. 12/19 ex lap abdominal wall closure with some bleeding from liver capsule controlled with cautery.  PMH includes afib, gout, GI bleed, stage IV lung cancer, alcohol abuse.   OT comments  Pt greeted supine in bed, agreeable to OT intervention. Pt currently requires CGA for bed mobility and CGA for sit>stands with RW and functional ambulation equal to a household distance. Pt requires MIN A for UB ADLs and set- up for seated grooming tasks. Max HR 116 bpm after ambulation. Patient will benefit from continued inpatient follow up therapy, <3 hours/day       If plan is discharge home, recommend the following:  A lot of help with walking and/or transfers;Two people to help with bathing/dressing/bathroom;Assistance with cooking/housework;Direct supervision/assist for medications management;Assist for transportation;Help with stairs or ramp for entrance   Equipment Recommendations  BSC/3in1;Wheelchair (measurements OT);Wheelchair cushion (measurements OT);Other (comment);Tub/shower bench (RW)    Recommendations for Other Services      Precautions / Restrictions Precautions Precautions: Fall Recall of Precautions/Restrictions: Intact Restrictions Weight Bearing Restrictions Per Provider Order: No       Mobility Bed Mobility Overal bed mobility: Needs Assistance Bed Mobility: Supine to Sit     Supine to sit: Contact guard, HOB elevated     General bed mobility comments: CGA with use of bed features     Transfers Overall transfer level: Needs assistance Equipment used: Rolling walker (2 wheels) Transfers: Sit to/from Stand Sit to Stand: Contact guard assist           General transfer comment: CGA to rise from EOB with use of RW, cues for hand placement     Balance Overall balance assessment: Needs assistance Sitting-balance support: No upper extremity supported, Feet supported Sitting balance-Leahy Scale: Fair     Standing balance support: Bilateral upper extremity supported, During functional activity, Reliant on assistive device for balance Standing balance-Leahy Scale: Poor Standing balance comment: reliant on BUE support                           ADL either performed or assessed with clinical judgement   ADL Overall ADL's : Needs assistance/impaired     Grooming: Wash/dry face;Set up Grooming Details (indicate cue type and reason): sitting in recliner         Upper Body Dressing : Minimal assistance;Sitting Upper Body Dressing Details (indicate cue type and reason): to don back side cover     Toilet Transfer: Contact guard assist;Ambulation;Rolling walker (2 wheels) Toilet Transfer Details (indicate cue type and reason): simulated via functional mobility with Rw         Functional mobility during ADLs: Contact guard assist;Rolling walker (2 wheels);Cueing for safety General ADL Comments: ADL participation impacted by decreased activity tolerance    Extremity/Trunk Assessment Upper Extremity Assessment Upper Extremity Assessment: Generalized weakness   Lower Extremity Assessment Lower Extremity Assessment: Defer to PT evaluation        Vision Baseline Vision/History: 1 Wears glasses Ability to See in Adequate Light: 0 Adequate Patient  Visual Report: No change from baseline Vision Assessment?: No apparent visual deficits;Wears glasses for reading   Perception Perception Perception: Not tested   Praxis Praxis Praxis: Not tested    Communication Communication Communication: Impaired Factors Affecting Communication: Hearing impaired   Cognition Arousal: Alert Behavior During Therapy: Flat affect Cognition: No apparent impairments, No family/caregiver present to determine baseline     Awareness: Intellectual awareness intact, Online awareness intact   Attention impairment (select first level of impairment): Alternating attention Executive functioning impairment (select all impairments): Organization, Initiation OT - Cognition Comments: increased time to process and follow commands, very flat during session but cooperative                 Following commands: Impaired Following commands impaired: Follows multi-step commands with increased time      Cueing   Cueing Techniques: Verbal cues, Tactile cues, Visual cues, Gestural cues  Exercises Other Exercises Other Exercises: seated LAQs and seated marches to assist with LB swelling    Shoulder Instructions       General Comments HR max 116 bpm    Pertinent Vitals/ Pain       Pain Assessment Pain Assessment: No/denies pain  Home Living                                          Prior Functioning/Environment              Frequency  Min 2X/week        Progress Toward Goals  OT Goals(current goals can now be found in the care plan section)  Progress towards OT goals: Progressing toward goals  Acute Rehab OT Goals Patient Stated Goal: none stated OT Goal Formulation: With patient Time For Goal Achievement: 02/09/24 Potential to Achieve Goals: Fair  Plan      Co-evaluation                 AM-PAC OT 6 Clicks Daily Activity     Outcome Measure   Help from another person eating meals?: A Little Help from another person taking care of personal grooming?: A Little Help from another person toileting, which includes using toliet, bedpan, or urinal?: A Lot Help from another person bathing (including washing,  rinsing, drying)?: A Lot Help from another person to put on and taking off regular upper body clothing?: A Little Help from another person to put on and taking off regular lower body clothing?: A Lot 6 Click Score: 15    End of Session Equipment Utilized During Treatment: Gait belt;Rolling walker (2 wheels)  OT Visit Diagnosis: Muscle weakness (generalized) (M62.81);Other (comment) (decreased activity tolerance)   Activity Tolerance Patient tolerated treatment well   Patient Left in chair;with call bell/phone within reach;with chair alarm set;with nursing/sitter in room   Nurse Communication Mobility status;Other (comment) (nurse present at end of session)        Time: 1130-1143 OT Time Calculation (min): 13 min  Charges: OT General Charges $OT Visit: 1 Visit OT Treatments $Self Care/Home Management : 8-22 mins  Ronal Mallie POUR., COTA/L Acute Rehabilitation Services [phone]   Ronal Mallie Needy 02/02/2024, 12:31 PM

## 2024-02-02 NOTE — Plan of Care (Signed)
  Problem: Education: Goal: Knowledge of General Education information will improve Description: Including pain rating scale, medication(s)/side effects and non-pharmacologic comfort measures Outcome: Progressing   Problem: Health Behavior/Discharge Planning: Goal: Ability to manage health-related needs will improve Outcome: Progressing   Problem: Clinical Measurements: Goal: Ability to maintain clinical measurements within normal limits will improve Outcome: Progressing Goal: Will remain free from infection Outcome: Progressing Goal: Diagnostic test results will improve Outcome: Progressing Goal: Respiratory complications will improve Outcome: Progressing Goal: Cardiovascular complication will be avoided Outcome: Progressing   Problem: Nutrition: Goal: Adequate nutrition will be maintained Outcome: Progressing   Problem: Coping: Goal: Level of anxiety will decrease Outcome: Progressing   Problem: Elimination: Goal: Will not experience complications related to bowel motility Outcome: Progressing Goal: Will not experience complications related to urinary retention Outcome: Progressing   Problem: Pain Managment: Goal: General experience of comfort will improve and/or be controlled Outcome: Progressing

## 2024-02-03 ENCOUNTER — Inpatient Hospital Stay (HOSPITAL_COMMUNITY)

## 2024-02-03 DIAGNOSIS — D62 Acute posthemorrhagic anemia: Secondary | ICD-10-CM | POA: Diagnosis not present

## 2024-02-03 DIAGNOSIS — K922 Gastrointestinal hemorrhage, unspecified: Secondary | ICD-10-CM | POA: Diagnosis not present

## 2024-02-03 DIAGNOSIS — I82721 Chronic embolism and thrombosis of deep veins of right upper extremity: Secondary | ICD-10-CM | POA: Diagnosis not present

## 2024-02-03 DIAGNOSIS — I482 Chronic atrial fibrillation, unspecified: Secondary | ICD-10-CM | POA: Diagnosis not present

## 2024-02-03 LAB — CBC
HCT: 49.8 % (ref 39.0–52.0)
Hemoglobin: 16.5 g/dL (ref 13.0–17.0)
MCH: 30 pg (ref 26.0–34.0)
MCHC: 33.1 g/dL (ref 30.0–36.0)
MCV: 90.5 fL (ref 80.0–100.0)
Platelets: 211 K/uL (ref 150–400)
RBC: 5.5 MIL/uL (ref 4.22–5.81)
RDW: 15.4 % (ref 11.5–15.5)
WBC: 7.7 K/uL (ref 4.0–10.5)
nRBC: 0 % (ref 0.0–0.2)

## 2024-02-03 LAB — LIPID PANEL
Cholesterol: 152 mg/dL (ref 0–200)
HDL: 27 mg/dL — ABNORMAL LOW
LDL Cholesterol: 98 mg/dL (ref 0–99)
Total CHOL/HDL Ratio: 5.7 ratio
Triglycerides: 136 mg/dL
VLDL: 27 mg/dL (ref 0–40)

## 2024-02-03 LAB — BASIC METABOLIC PANEL WITH GFR
Anion gap: 13 (ref 5–15)
BUN: 36 mg/dL — ABNORMAL HIGH (ref 8–23)
CO2: 21 mmol/L — ABNORMAL LOW (ref 22–32)
Calcium: 9.1 mg/dL (ref 8.9–10.3)
Chloride: 102 mmol/L (ref 98–111)
Creatinine, Ser: 1.43 mg/dL — ABNORMAL HIGH (ref 0.61–1.24)
GFR, Estimated: 52 mL/min — ABNORMAL LOW
Glucose, Bld: 132 mg/dL — ABNORMAL HIGH (ref 70–99)
Potassium: 4.5 mmol/L (ref 3.5–5.1)
Sodium: 135 mmol/L (ref 135–145)

## 2024-02-03 MED ORDER — IOHEXOL 350 MG/ML SOLN
75.0000 mL | Freq: Once | INTRAVENOUS | Status: AC | PRN
  Administered 2024-02-03: 75 mL via INTRAVENOUS

## 2024-02-03 MED ORDER — LACTATED RINGERS IV SOLN: INTRAVENOUS | Status: AC

## 2024-02-03 NOTE — Progress Notes (Signed)
 " PROGRESS NOTE    Raymond Hurst  FMW:[id number] DOB: [birth year] DOA: 01/16/2024 PCP: [name], MD    Brief Narrative:  The patient is a 74 year old male with PMHx of metastatic lung adenocarcinoma (LUL, PD-L1 20%) s/p carbo/taxol  + XRT (06-09/2023) on durvalumab , AFib on Eliquis , COPD, gout, OSA on CPAP, chronic lymphedema, previous alcohol abuse, UGIB 2/2 cratered duodenal ulcer s/p GDA embolization (09/09/2023), recurrent UGIB 2/2 gastric ulcer, who presented to [facility] on 01/16/2024 complaining of dizziness, nausea, and hematemesis, admitted for ABLA and hypotension secondary to acute UGIB s/p transfusion of 3 units pRBC, underwent EGD (12/17) which showed duodenal bulb bleeding possible from coil erosion s/p Hemospray. The patient was kept intubated and transferred to [facility] for IR eval. While in the ICU on 12/17, he went into hemorrhagic shock and required pressors and MTP, Kcentra , TXA, and factor 7. Underwent emergent ex-lap with duodenotomy and ligation of GDA with wound vac placement. Hgb and BP stabilized and he was off pressors. Underwent secondary closure of abdomen on 12/19 with JP drain placement. Extubated on 12/23. Transferred out of ICU on 12/26.   Assessment and Plan:  # Duodenal ulcer hemorrhage with coiling 09/2023 # s/p laparotomy 12/17, secondary closure 12/19 as above - Vomiting resolved from 12/26 - Off TPN on 12/31 - JP drain removed 12/31 - Diet being advanced by gen surg, now on soft diet - Switched to PO PPI BID  # BRBPR - Had episode of bright red blood in stool today - Eliquis  held out of precaution - Remained HDS - Stat CBC showed Hgb 16.5, stable and improved from prior days - General surgery reengaged - CTA GIB negative for active bleeding - Suspect source likely hemorrhoids  - Plan to consult GI in the morning for possible anoscopy  - Repeat CBC in the morning   # Sepsis secondary to above  # Staph epi bacteremia-MRSE with resistances # Likely  peritonitis - Bcx 12/21 postive for staph epi - Bcx 12/26 no growth - Formally consulted ID on 12/30  - Pt has completed course of antibiotics as of 12/30  - RIJ CVC removed 1/2  # Atrial fibrillation  - CHADVASC >4 - Patient's family discussed with cardiology on 1/2, agreed to continue Eliquis . Please see cardiology consult note from 02/02/2024.  - Eliquis  was again stopped today given episode of bright red blood in stools until etiology can be identified  # Right cephalic vein acute SVT # Right brachial vein acute DVT - Noted on RUE doppler on 12/29 - Eliquis  held as above  # Hypernatremia - Thought to be secondary to free water  deficit - Resolved   # AKI on admission--- resolving -see above as well as fluid status - Baseline Cr 0.7 - 0.9 - Cr up to 1.43 from 1.32 today  # Hypokalemia - Resolved  # Urinary retention - Foley was initially removed and patient was voiding well. However, noted to be retaining again today.  - Indwelling cath placed and will be kept at discharge. Will need outpt f/u with urology   # Left lung mass bronchogenic CA - Holding immunotherapy/chemotherapy - Outpatient follow-up   # Critical illness thrombocytopenia - resolved    Scheduled Meds:  budesonide -glycopyrrolate -formoterol   2 puff Inhalation BID   Chlorhexidine  Gluconate Cloth  6 each Topical Daily   feeding supplement  237 mL Oral BID BM   Gerhardt's butt cream   Topical BID   multivitamin with minerals  1 tablet Oral Daily   mouth  rinse  15 mL Mouth Rinse TID   pantoprazole   40 mg Oral BID   Continuous Infusions:  lactated ringers  75 mL/hr at 02/03/24 1659    PRN Meds:.acetaminophen , hydrALAZINE , HYDROcodone -acetaminophen , hydrOXYzine , ipratropium-albuterol , labetalol , ondansetron  (ZOFRAN ) IV, sodium chloride  flush  Current Outpatient Medications  Medication Instructions   acetaminophen  (TYLENOL ) 500 mg, Every 6 hours PRN   allopurinol  (ZYLOPRIM ) 100 mg, Daily   apixaban   (ELIQUIS ) 5 mg, Oral, 2 times daily, Okay to restart this medication on 08/29/2023   budesonide -glycopyrrolate -formoterol  (BREZTRI  AEROSPHERE) 160-9-4.8 MCG/ACT AERO inhaler 2 puffs, Inhalation, 2 times daily   cholecalciferol  (VITAMIN D3) 1,000 Units, Daily   cyanocobalamin  (VITAMIN B12) 100 mcg, Daily   folic acid  (FOLVITE ) 1 mg, Oral, Daily   furosemide  (LASIX ) 10 mg, Oral, Daily   ipratropium-albuterol  (DUONEB) 0.5-2.5 (3) MG/3ML SOLN 3 mLs, Nebulization, Every 6 hours PRN   loperamide  (IMODIUM  A-D) 2-4 mg, 4 times daily PRN   metoprolol  tartrate (LOPRESSOR ) 25 mg, Oral, 2 times daily   pantoprazole  (PROTONIX ) 40 MG tablet TAKE 1 TABLET (40 MG TOTAL) BY MOUTH TWICE A DAY BEFORE MEALS   thiamine  (VITAMIN B-1) 100 mg, Daily    DVT prophylaxis: SCDs Start: 01/16/24 1731   Code Status:   Code Status: Full Code  Family Communication: Attempted to call patient's wife Ms. Josiah once and left voicemail.  Attempted to call her three more times, voicemail was full. Finally, called again after being informed that she was now available by RN staff. Discussed the risks and benefits of Eliquis  as noted above.  Disposition Plan: Has been weaned off TPN and is medically ready for discharge to SNF. Pending bed.  PT - Follow Up Recommendations: Skilled nursing-short term rehab (<3 hours/day) - PT equipment: BSC/3in1, Hospital bed OT - Follow Up Recommendations: Skilled nursing-short term rehab (<3 hours/day) -    Level of care: Telemetry  Consultants:  General surgery, ID, GI, PCCM    Subjective: NAEO.  Had episode of bloody stools this morning. Has history of hemorrhoids, denied constipation or straining. Denies hematemesis, abdominal pain, lightheadedness, syncope.    Objective: Vitals:   02/03/24 1009 02/03/24 1122 02/03/24 1744 02/03/24 1930  BP: 126/77 114/78 126/74 114/79  Pulse: 86 94 80 95  Resp:  18 18 19   Temp: 98.6 F (37 C) 98.3 F (36.8 C)  (!) 97.4 F (36.3 C)  TempSrc:  Oral   Oral  SpO2: 95% 97% 95% 98%  Weight:      Height:        Intake/Output Summary (Last 24 hours) at 02/03/2024 2232 Last data filed at 02/03/2024 1930 Gross per 24 hour  Intake 270 ml  Output 600 ml  Net -330 ml   Filed Weights   01/22/24 0232 01/25/24 0452 01/29/24 0406  Weight: 135.7 kg 127.2 kg (!) 136.7 kg    Examination:  Gen: NAD, A&Ox3 HEENT: NCAT Neck: Supple CV: RRR, no murmurs Resp: normal WOB, CTAB anteriorly Abd: Soft, NTND, no guarding, midline incision dressing c/d/i Ext: BLE swelling and chronic venous stasis changes Skin: Warm, dry Neuro: No focal deficits Psych: Calm, cooperative, appropriate affect    Data Reviewed: I have personally reviewed following labs and imaging studies  CBC: Recent Labs  Lab 01/29/24 1628 01/30/24 0323 02/01/24 0047 02/02/24 0326 02/03/24 1129  WBC  --  8.7 7.6 7.4 7.7  HGB 15.3 15.1 15.2 15.7 16.5  HCT 48.3 46.1 47.5 47.2 49.8  MCV  --  90.7 91.5 89.7 90.5  PLT  --  152 179 193 211   Basic Metabolic Panel: Recent Labs  Lab 01/28/24 0355 01/29/24 0330 01/31/24 0312 02/01/24 0047 02/02/24 0326 02/03/24 1225  NA 146* 145 139 139 137 135  K 3.9 4.1 4.3 4.3 4.0 4.5  CL 112* 109 104 104 103 102  CO2 25 25 24 24 22  21*  GLUCOSE 116* 120* 106* 92 120* 132*  BUN 40* 38* 39* 36* 33* 36*  CREATININE 1.34* 1.31* 1.31* 1.32* 1.32* 1.43*  CALCIUM  8.9 9.3 9.1 9.0 8.9 9.1  MG 2.2 2.3 2.4 2.3  --   --   PHOS 3.6 4.1 4.3 4.2  --   --    GFR: Estimated Creatinine Clearance: 67.8 mL/min (A) (by C-G formula based on SCr of 1.43 mg/dL (H)). Liver Function Tests: Recent Labs  Lab 01/28/24 0355 01/29/24 0330  AST  --  50*  ALT  --  58*  ALKPHOS  --  92  BILITOT  --  0.9  PROT  --  6.2*  ALBUMIN 2.9* 3.0*   No results for input(s): LIPASE, AMYLASE in the last 168 hours. No results for input(s): AMMONIA in the last 168 hours. Coagulation Profile: No results for input(s): INR, PROTIME in the last 168  hours. Cardiac Enzymes: No results for input(s): CKTOTAL, CKMB, CKMBINDEX, TROPONINI in the last 168 hours. BNP (last 3 results) No results for input(s): PROBNP in the last 8760 hours. HbA1C: No results for input(s): HGBA1C in the last 72 hours. CBG: Recent Labs  Lab 02/01/24 1953  GLUCAP 110*   Lipid Profile: Recent Labs    02/03/24 0645  CHOL 152  HDL 27*  LDLCALC 98  TRIG 863  CHOLHDL 5.7    Thyroid  Function Tests: No results for input(s): TSH, T4TOTAL, FREET4, T3FREE, THYROIDAB in the last 72 hours. Anemia Panel: No results for input(s): VITAMINB12, FOLATE, FERRITIN, TIBC, IRON , RETICCTPCT in the last 72 hours. Sepsis Labs: No results for input(s): PROCALCITON, LATICACIDVEN in the last 168 hours.  Recent Results (from the past 240 hours)  Culture, blood (Routine X 2) w Reflex to ID Panel     Status: None   Collection Time: 01/26/24  2:24 PM   Specimen: BLOOD RIGHT HAND  Result Value Ref Range Status   Specimen Description BLOOD RIGHT HAND  Final   Special Requests   Final    BOTTLES DRAWN AEROBIC ONLY Blood Culture results may not be optimal due to an inadequate volume of blood received in culture bottles   Culture   Final    NO GROWTH 5 DAYS Performed at [facility], [address], [place], [place] [address]    Report Status 01/31/2024 FINAL  Final  Culture, blood (Routine X 2) w Reflex to ID Panel     Status: None   Collection Time: 01/26/24  2:27 PM   Specimen: BLOOD LEFT HAND  Result Value Ref Range Status   Specimen Description BLOOD LEFT HAND  Final   Special Requests   Final    BOTTLES DRAWN AEROBIC ONLY Blood Culture results may not be optimal due to an inadequate volume of blood received in culture bottles   Culture   Final    NO GROWTH 5 DAYS Performed at [facility], [address] [place], [place] [address]    Report Status 01/31/2024 FINAL  Final     Radiology Studies: CT ANGIO GI  BLEED Result Date: 02/03/2024 CLINICAL DATA:  Lower gastrointestinal bleeding EXAM: CTA ABDOMEN AND PELVIS WITHOUT  AND WITH CONTRAST TECHNIQUE: Multidetector CT imaging of the abdomen and pelvis was performed using the standard protocol during bolus administration of intravenous contrast. Multiplanar reconstructed images and MIPs were obtained and reviewed to evaluate the vascular anatomy. RADIATION DOSE REDUCTION: This exam was performed according to the departmental dose-optimization program which includes automated exposure control, adjustment of the mA and/or kV according to patient size and/or use of iterative reconstruction technique. CONTRAST:  75mL OMNIPAQUE  IOHEXOL  350 MG/ML SOLN COMPARISON:  January 06, 2024 FINDINGS: VASCULAR Aorta: Aortic atherosclerosis without aneurysm or dissection. Celiac: Patent without evidence of aneurysm, dissection, vasculitis or significant stenosis. SMA: Patent without evidence of aneurysm, dissection, vasculitis or significant stenosis. Renals: Both renal arteries are patent without evidence of aneurysm, dissection, vasculitis, fibromuscular dysplasia or significant stenosis. IMA: Patent without evidence of aneurysm, dissection, vasculitis or significant stenosis. Inflow: Patent without evidence of aneurysm, dissection, vasculitis or significant stenosis. Proximal Outflow: Bilateral common femoral and visualized portions of the superficial and profunda femoral arteries are patent without evidence of aneurysm, dissection, vasculitis or significant stenosis. Veins: No obvious venous abnormality within the limitations of this arterial phase study. Review of the MIP images confirms the above findings. NON-VASCULAR Lower chest: No acute abnormality. Hepatobiliary: No focal liver abnormality is seen. No gallstones, gallbladder wall thickening, or biliary dilatation. Pancreas: Unremarkable. No pancreatic ductal dilatation or surrounding inflammatory changes. Spleen: Normal in size  without focal abnormality. Adrenals/Urinary Tract: Stable right adrenal myelolipoma. Left adrenal gland is unremarkable. Stable bilateral renal cysts. No hydronephrosis or renal obstruction. Urinary bladder is decompressed secondary to Foley catheter. Large left-sided diverticulum is again noted. Stomach/Bowel: The stomach is unremarkable. The appendix appears normal. There is no evidence of bowel obstruction. Diverticulosis is noted throughout the colon. Minimal inflammatory changes are seen adjacent to proximal sigmoid colon suggesting possible mild diverticulitis. Lymphatic: No adenopathy is noted. Reproductive: Prostatic calcifications are again noted and unchanged. Other: No ascites or hernia is noted. Musculoskeletal: Old L1 compression fracture is noted. IMPRESSION: 1. No evidence of active gastrointestinal bleeding. 2. Diverticulosis is noted throughout the colon. Minimal inflammatory changes are seen adjacent to proximal sigmoid colon suggesting possible mild diverticulitis. 3. Stable right adrenal myelolipoma. 4. Stable bilateral renal cysts. 5. Urinary bladder is decompressed secondary to Foley catheter. Large left-sided diverticulum is again noted. 6. Aortic atherosclerosis. Aortic Atherosclerosis (ICD10-I70.0). Electronically Signed   By: Lynwood Landy Raddle M.D.   On: 02/03/2024 15:40    Scheduled Meds:  budesonide -glycopyrrolate -formoterol   2 puff Inhalation BID   Chlorhexidine  Gluconate Cloth  6 each Topical Daily   feeding supplement  237 mL Oral BID BM   Gerhardt's butt cream   Topical BID   multivitamin with minerals  1 tablet Oral Daily   mouth rinse  15 mL Mouth Rinse TID   pantoprazole   40 mg Oral BID   Continuous Infusions:  lactated ringers  75 mL/hr at 02/03/24 1659      Unresulted Labs (From admission, onward)     Start     Ordered   02/04/24 0500  CBC  Tomorrow morning,   R       Question:  Specimen collection method  Answer:  IV Team=IV Team collect   02/03/24 1656    02/04/24 0500  Basic metabolic panel with GFR  Tomorrow morning,   R       Question:  Specimen collection method  Answer:  IV Team=IV Team collect   02/03/24 1656  LOS:  LOS: 17 days   Time Spent: 60 minutes  Shira Bobst Al-Sultani, MD [facility]  If 7PM-7AM, please contact night-coverage  02/03/2024, 10:32 PM      "

## 2024-02-03 NOTE — Plan of Care (Signed)

## 2024-02-03 NOTE — Progress Notes (Signed)
 Patient ID: OTHON GUARDIA, male   DOB: 05/13/1950, 74 y.o.   MRN: [id number] 15 Days Post-Op    Subjective: Notified by [facility] that patient had bloody BM. Patient denies abd pain and says it wasn't that much. ROS negative except as listed above. Objective: Vital signs in last 24 hours: Temp:  [97.5 F (36.4 C)-98.6 F (37 C)] 98.3 F (36.8 C) (01/03 1122) Pulse Rate:  [84-101] 94 (01/03 1122) Resp:  [18-20] 18 (01/03 1122) BP: (114-132)/(72-78) 114/78 (01/03 1122) SpO2:  [94 %-97 %] 97 % (01/03 1122) Last BM Date : 02/03/24  Intake/Output from previous day: 01/02 0701 - 01/03 0700 In: 1290 [P.O.:1290] Out: 2500 [Urine:2500] Intake/Output this shift: Total I/O In: -  Out: 600 [Urine:600]  ABD soft, NT, ND, wound clean with WTD  Lab Results: CBC  Recent Labs    02/02/24 0326 02/03/24 1129  WBC 7.4 7.7  HGB 15.7 16.5  HCT 47.2 49.8  PLT 193 211   BMET Recent Labs    02/01/24 0047 02/02/24 0326  NA 139 137  K 4.3 4.0  CL 104 103  CO2 24 22  GLUCOSE 92 120*  BUN 36* 33*  CREATININE 1.32* 1.32*  CALCIUM  9.0 8.9   PT/INR No results for input(s): LABPROT, INR in the last 72 hours. ABG No results for input(s): PHART, HCO3 in the last 72 hours.  Invalid input(s): PCO2, PO2  Studies/Results: No results found.  Anti-infectives: Anti-infectives (From admission, onward)    Start     Dose/Rate Route Frequency Ordered Stop   01/29/24 2200  fluconazole  (DIFLUCAN ) IVPB 400 mg  Status:  Discontinued        400 mg 100 mL/hr over 120 Minutes Intravenous Every 24 hours 01/29/24 1336 01/29/24 1346   01/29/24 2200  fluconazole  (DIFLUCAN ) IVPB 400 mg  Status:  Discontinued        400 mg 200 mL/hr over 60 Minutes Intravenous Every 24 hours 01/29/24 1346 01/30/24 1533   01/25/24 1500  vancomycin  (VANCOREADY) IVPB 1500 mg/300 mL  Status:  Discontinued        1,500 mg 150 mL/hr over 120 Minutes Intravenous Every 24 hours 01/24/24 1322 01/30/24 1233    01/24/24 1115  vancomycin  (VANCOCIN ) 2,500 mg in sodium chloride  0.9 % 500 mL IVPB        2,500 mg 262.5 mL/hr over 120 Minutes Intravenous  Once 01/24/24 1029 01/24/24 1530   01/17/24 2245  piperacillin -tazobactam (ZOSYN ) IVPB 3.375 g  Status:  Discontinued        3.375 g 12.5 mL/hr over 240 Minutes Intravenous Every 8 hours 01/17/24 2149 01/30/24 1233   01/17/24 2200  fluconazole  (DIFLUCAN ) IVPB 400 mg  Status:  Discontinued        400 mg 100 mL/hr over 120 Minutes Intravenous Every 24 hours 01/17/24 2059 01/29/24 1336       Assessment/Plan: POD 17 S/P LAPAROTOMY, EXPLORATORY, Duodenotomy, Ligation of GDA and branches, ABThera wound VAC placement Dr. Rubin  POD 15 S/P exp lap, abdominal wall closure Dr. Ebbie; some bleeding from liver capsule controlled with cautery and surgicel snow.    - UGI 12/26 w/o evidence of leak, JP output stable - Soft diet -Continue dressing changes - reported bloody BM. CT angio A/P ordered. Rec hold DOAC   Per [facility]: Metastatic lung CA VDRF COPD OSA AKI  PMH CVA PMH afib on eliquis  - recommend hold DOAC with recurrent GIB  LOS: 17 days    Dann Hummer, MD, MPH, FACS Trauma &  General Surgery Use [email or web address] to contact on call provider  02/03/2024

## 2024-02-03 NOTE — Progress Notes (Signed)
 20 gauge PIV placed in right AC, stat BMP drawn.

## 2024-02-03 NOTE — Progress Notes (Signed)
 Red blood noted in toilet after patient had BM. MD notified. Stat cbc ordered. Will hold Eliquise per MD.

## 2024-02-04 DIAGNOSIS — Z8673 Personal history of transient ischemic attack (TIA), and cerebral infarction without residual deficits: Secondary | ICD-10-CM

## 2024-02-04 DIAGNOSIS — D62 Acute posthemorrhagic anemia: Secondary | ICD-10-CM | POA: Diagnosis not present

## 2024-02-04 DIAGNOSIS — I82721 Chronic embolism and thrombosis of deep veins of right upper extremity: Secondary | ICD-10-CM | POA: Diagnosis not present

## 2024-02-04 DIAGNOSIS — I4891 Unspecified atrial fibrillation: Secondary | ICD-10-CM | POA: Diagnosis not present

## 2024-02-04 DIAGNOSIS — J449 Chronic obstructive pulmonary disease, unspecified: Secondary | ICD-10-CM

## 2024-02-04 DIAGNOSIS — K921 Melena: Secondary | ICD-10-CM | POA: Diagnosis not present

## 2024-02-04 DIAGNOSIS — I482 Chronic atrial fibrillation, unspecified: Secondary | ICD-10-CM | POA: Diagnosis not present

## 2024-02-04 DIAGNOSIS — G4730 Sleep apnea, unspecified: Secondary | ICD-10-CM | POA: Diagnosis not present

## 2024-02-04 DIAGNOSIS — K922 Gastrointestinal hemorrhage, unspecified: Secondary | ICD-10-CM | POA: Diagnosis not present

## 2024-02-04 LAB — CBC
HCT: 46.5 % (ref 39.0–52.0)
Hemoglobin: 15.3 g/dL (ref 13.0–17.0)
MCH: 29.7 pg (ref 26.0–34.0)
MCHC: 32.9 g/dL (ref 30.0–36.0)
MCV: 90.3 fL (ref 80.0–100.0)
Platelets: 197 K/uL (ref 150–400)
RBC: 5.15 MIL/uL (ref 4.22–5.81)
RDW: 15.6 % — ABNORMAL HIGH (ref 11.5–15.5)
WBC: 8.6 K/uL (ref 4.0–10.5)
nRBC: 0 % (ref 0.0–0.2)

## 2024-02-04 LAB — HEMOGLOBIN AND HEMATOCRIT, BLOOD
HCT: 45.1 % (ref 39.0–52.0)
HCT: 48.3 % (ref 39.0–52.0)
Hemoglobin: 15 g/dL (ref 13.0–17.0)
Hemoglobin: 15.5 g/dL (ref 13.0–17.0)

## 2024-02-04 LAB — BASIC METABOLIC PANEL WITH GFR
Anion gap: 12 (ref 5–15)
BUN: 33 mg/dL — ABNORMAL HIGH (ref 8–23)
CO2: 23 mmol/L (ref 22–32)
Calcium: 9 mg/dL (ref 8.9–10.3)
Chloride: 102 mmol/L (ref 98–111)
Creatinine, Ser: 1.46 mg/dL — ABNORMAL HIGH (ref 0.61–1.24)
GFR, Estimated: 51 mL/min — ABNORMAL LOW
Glucose, Bld: 99 mg/dL (ref 70–99)
Potassium: 4.2 mmol/L (ref 3.5–5.1)
Sodium: 137 mmol/L (ref 135–145)

## 2024-02-04 MED ORDER — LACTATED RINGERS IV SOLN: INTRAVENOUS | Status: AC

## 2024-02-04 MED ORDER — PANTOPRAZOLE SODIUM 40 MG IV SOLR
40.0000 mg | Freq: Two times a day (BID) | INTRAVENOUS | Status: DC
  Administered 2024-02-04 – 2024-02-10 (×14): 40 mg via INTRAVENOUS
  Filled 2024-02-04 (×14): qty 10

## 2024-02-04 NOTE — Plan of Care (Signed)

## 2024-02-04 NOTE — Progress Notes (Signed)
 Patient ID: Raymond Hurst, male   DOB: 01/05/1951, 74 y.o.   MRN: [id number] CT angio neg. Hb stable. Recommend hold DOAC for another week.  Dann Hummer, MD, MPH, FACS Please use [email or web address] to contact on call provider

## 2024-02-04 NOTE — Progress Notes (Signed)
 Patient had a second bowel movement today. Stool was large in amount, black in color, with the presence of cherry-red blood. MD notified. Per MD orders, patient will be NPO after midnight. Patient placed on bedrest with bathroom privileges and instructed to call nursing staff for assistance to the bedside commode. Hemoglobin and Hematocrit to be monitored every 6 hours. Lactated Ringers  initiated at 75 Ml/hr for 13 hours.

## 2024-02-04 NOTE — Progress Notes (Incomplete)
 Patient had a large bm. BM had visible dark red blood and tarry colored.

## 2024-02-04 NOTE — Progress Notes (Signed)
 " PROGRESS NOTE    Raymond Hurst  FMW:[id number] DOB: February 16, 1950 DOA: 01/16/2024 PCP: [name], MD    Brief Narrative:  The patient is a 74 year old male with PMHx of metastatic lung adenocarcinoma (LUL, PD-L1 20%) s/p carbo/taxol  + XRT (06-09/2023) on durvalumab , AFib on Eliquis , COPD, gout, OSA on CPAP, chronic lymphedema, previous alcohol abuse, UGIB 2/2 cratered duodenal ulcer s/p GDA embolization (09/09/2023), recurrent UGIB 2/2 gastric ulcer, who presented to [facility] on 01/16/2024 complaining of dizziness, nausea, and hematemesis, admitted for ABLA and hypotension secondary to acute UGIB s/p transfusion of 3 units pRBC, underwent EGD (12/17) which showed duodenal bulb bleeding possible from coil erosion s/p Hemospray. The patient was kept intubated and transferred to [facility] for IR eval. While in the ICU on 12/17, he went into hemorrhagic shock and required pressors and MTP, Kcentra , TXA, and factor 7. Underwent emergent ex-lap with duodenotomy and ligation of GDA with wound vac placement. Hgb and BP stabilized and he was off pressors. Underwent secondary closure of abdomen on 12/19 with JP drain placement. Extubated on 12/23. Transferred out of ICU on 12/26.   Assessment and Plan:  # Duodenal ulcer hemorrhage with coiling 09/2023 # s/p laparotomy 12/17, secondary closure 12/19 as above - Vomiting resolved since 12/26 - Off TPN on 12/31 - JP drain removed 12/31 - Diet being advanced by gen surg, now on soft diet - Switched back to IV PPI BID  # Recurrent GI bleed - Had two more episodes of bloody stools, one bright red and one large melanotic stools (images in media) - Continue holding Eliquis  - Remained HDS - Hgb stable - General surgery following - CTA GIB negative for active bleeding - GI consulted, planning for EGD tomorrow - q6h H&H - IV protonix  BID   # Sepsis secondary to above  # Staph epi bacteremia-MRSE with resistances # Likely peritonitis - Bcx 12/21 postive for  staph epi - Bcx 12/26 no growth - Formally consulted ID on 12/30  - Pt has completed course of antibiotics as of 12/30  - RIJ CVC removed 1/2  # AKI on admission--- resolving -see above as well as fluid status - Baseline Cr 0.7 - 0.9. Most recently has been consistent 1.31-1.34 - Cr up to 1.46 - LR 75 mL/hr for 13 hours  # Atrial fibrillation  - CHADVASC >4 - Patient's family discussed with cardiology on 1/2, agreed to continue Eliquis . Please see cardiology consult note from 02/02/2024.  - Eliquis  was again stopped on 1/3 given episode of bright red blood in stools   # Right cephalic vein acute SVT # Right brachial vein acute DVT - Noted on RUE doppler on 12/29 - Eliquis  held as above  # Hypernatremia - Thought to be secondary to free water  deficit - Resolved  # Hypokalemia - Resolved  # Urinary retention - Foley was initially removed and patient was voiding well. However, noted to be retaining again today.  - Indwelling cath placed and will be kept at discharge. Will need outpt f/u with urology   # Left lung mass bronchogenic CA - Holding immunotherapy/chemotherapy - Outpatient follow-up   # Critical illness thrombocytopenia - resolved    Scheduled Meds:  budesonide -glycopyrrolate -formoterol   2 puff Inhalation BID   Chlorhexidine  Gluconate Cloth  6 each Topical Daily   feeding supplement  237 mL Oral BID BM   Gerhardt's butt cream   Topical BID   multivitamin with minerals  1 tablet Oral Daily   mouth rinse  15  mL Mouth Rinse TID   pantoprazole  (PROTONIX ) IV  40 mg Intravenous Q12H   Continuous Infusions:  lactated ringers       PRN Meds:.acetaminophen , hydrALAZINE , HYDROcodone -acetaminophen , hydrOXYzine , ipratropium-albuterol , labetalol , ondansetron  (ZOFRAN ) IV, sodium chloride  flush  Current Outpatient Medications  Medication Instructions   acetaminophen  (TYLENOL ) 500 mg, Every 6 hours PRN   allopurinol  (ZYLOPRIM ) 100 mg, Daily   apixaban  (ELIQUIS ) 5 mg,  Oral, 2 times daily, Okay to restart this medication on 08/29/2023   budesonide -glycopyrrolate -formoterol  (BREZTRI  AEROSPHERE) 160-9-4.8 MCG/ACT AERO inhaler 2 puffs, Inhalation, 2 times daily   cholecalciferol  (VITAMIN D3) 1,000 Units, Daily   cyanocobalamin  (VITAMIN B12) 100 mcg, Daily   folic acid  (FOLVITE ) 1 mg, Oral, Daily   furosemide  (LASIX ) 10 mg, Oral, Daily   ipratropium-albuterol  (DUONEB) 0.5-2.5 (3) MG/3ML SOLN 3 mLs, Nebulization, Every 6 hours PRN   loperamide  (IMODIUM  A-D) 2-4 mg, 4 times daily PRN   metoprolol  tartrate (LOPRESSOR ) 25 mg, Oral, 2 times daily   pantoprazole  (PROTONIX ) 40 MG tablet TAKE 1 TABLET (40 MG TOTAL) BY MOUTH TWICE A DAY BEFORE MEALS   thiamine  (VITAMIN B-1) 100 mg, Daily    DVT prophylaxis: SCDs Start: 01/16/24 1731   Code Status:   Code Status: Full Code  Family Communication: GI service updated patient's wife  Disposition Plan: Has been weaned off TPN and is medically ready for discharge to SNF. Pending bed.  PT - Follow Up Recommendations: Skilled nursing-short term rehab (<3 hours/day) - PT equipment: BSC/3in1, Hospital bed OT - Follow Up Recommendations: Skilled nursing-short term rehab (<3 hours/day) -    Level of care: Telemetry  Consultants:  General surgery, ID, GI, PCCM    Subjective: NAEO.  Remained HDS. Had another episode of bloody stools in the morning then an episode of melanotic stool with dark blood. Continues to deny hematemesis, abdominal pain, lightheadedness, syncope.    Objective: Vitals:   02/03/24 1744 02/03/24 1930 02/04/24 0350 02/04/24 0737  BP: 126/74 114/79 120/78 128/82  Pulse: 80 95 93 95  Resp: 18 19 19 18   Temp:  (!) 97.4 F (36.3 C) (!) 97.5 F (36.4 C) 98.6 F (37 C)  TempSrc:  Oral Oral   SpO2: 95% 98% 97% 100%  Weight:      Height:        Intake/Output Summary (Last 24 hours) at 02/04/2024 1413 Last data filed at 02/04/2024 0639 Gross per 24 hour  Intake 1619.89 ml  Output 4600 ml  Net  -2980.11 ml   Filed Weights   01/22/24 0232 01/25/24 0452 01/29/24 0406  Weight: 135.7 kg 127.2 kg (!) 136.7 kg    Examination:  Gen: NAD, A&Ox3 HEENT: NCAT Neck: Supple CV: RRR, no murmurs Resp: normal WOB, CTAB anteriorly Abd: Soft, NTND, no guarding, midline incision dressing c/d/i Ext: BLE swelling and chronic venous stasis changes Skin: Warm, dry Neuro: No focal deficits Psych: Calm, cooperative, appropriate affect    Data Reviewed: I have personally reviewed following labs and imaging studies  CBC: Recent Labs  Lab 01/30/24 0323 02/01/24 0047 02/02/24 0326 02/03/24 1129 02/04/24 0527  WBC 8.7 7.6 7.4 7.7 8.6  HGB 15.1 15.2 15.7 16.5 15.3  HCT 46.1 47.5 47.2 49.8 46.5  MCV 90.7 91.5 89.7 90.5 90.3  PLT 152 179 193 211 197   Basic Metabolic Panel: Recent Labs  Lab 01/29/24 0330 01/31/24 0312 02/01/24 0047 02/02/24 0326 02/03/24 1225 02/04/24 0527  NA 145 139 139 137 135 137  K 4.1 4.3 4.3 4.0 4.5 4.2  CL 109 104 104 103 102 102  CO2 25 24 24 22  21* 23  GLUCOSE 120* 106* 92 120* 132* 99  BUN 38* 39* 36* 33* 36* 33*  CREATININE 1.31* 1.31* 1.32* 1.32* 1.43* 1.46*  CALCIUM  9.3 9.1 9.0 8.9 9.1 9.0  MG 2.3 2.4 2.3  --   --   --   PHOS 4.1 4.3 4.2  --   --   --    GFR: Estimated Creatinine Clearance: 66.4 mL/min (A) (by C-G formula based on SCr of 1.46 mg/dL (H)). Liver Function Tests: Recent Labs  Lab 01/29/24 0330  AST 50*  ALT 58*  ALKPHOS 92  BILITOT 0.9  PROT 6.2*  ALBUMIN 3.0*   No results for input(s): LIPASE, AMYLASE in the last 168 hours. No results for input(s): AMMONIA in the last 168 hours. Coagulation Profile: No results for input(s): INR, PROTIME in the last 168 hours. Cardiac Enzymes: No results for input(s): CKTOTAL, CKMB, CKMBINDEX, TROPONINI in the last 168 hours. BNP (last 3 results) No results for input(s): PROBNP in the last 8760 hours. HbA1C: No results for input(s): HGBA1C in the last 72  hours. CBG: Recent Labs  Lab 02/01/24 1953  GLUCAP 110*   Lipid Profile: Recent Labs    02/03/24 0645  CHOL 152  HDL 27*  LDLCALC 98  TRIG 863  CHOLHDL 5.7    Thyroid  Function Tests: No results for input(s): TSH, T4TOTAL, FREET4, T3FREE, THYROIDAB in the last 72 hours. Anemia Panel: No results for input(s): VITAMINB12, FOLATE, FERRITIN, TIBC, IRON , RETICCTPCT in the last 72 hours. Sepsis Labs: No results for input(s): PROCALCITON, LATICACIDVEN in the last 168 hours.  Recent Results (from the past 240 hours)  Culture, blood (Routine X 2) w Reflex to ID Panel     Status: None   Collection Time: 01/26/24  2:24 PM   Specimen: BLOOD RIGHT HAND  Result Value Ref Range Status   Specimen Description BLOOD RIGHT HAND  Final   Special Requests   Final    BOTTLES DRAWN AEROBIC ONLY Blood Culture results may not be optimal due to an inadequate volume of blood received in culture bottles   Culture   Final    NO GROWTH 5 DAYS Performed at [facility], [address], [place], [place] [address]    Report Status 01/31/2024 FINAL  Final  Culture, blood (Routine X 2) w Reflex to ID Panel     Status: None   Collection Time: 01/26/24  2:27 PM   Specimen: BLOOD LEFT HAND  Result Value Ref Range Status   Specimen Description BLOOD LEFT HAND  Final   Special Requests   Final    BOTTLES DRAWN AEROBIC ONLY Blood Culture results may not be optimal due to an inadequate volume of blood received in culture bottles   Culture   Final    NO GROWTH 5 DAYS Performed at [facility], [address], [place], [place] [address]    Report Status 01/31/2024 FINAL  Final     Radiology Studies: CT ANGIO GI BLEED Result Date: 02/03/2024 CLINICAL DATA:  Lower gastrointestinal bleeding EXAM: CTA ABDOMEN AND PELVIS WITHOUT AND WITH CONTRAST TECHNIQUE: Multidetector CT imaging of the abdomen and pelvis was performed using the standard protocol during bolus administration  of intravenous contrast. Multiplanar reconstructed images and MIPs were obtained and reviewed to evaluate the vascular anatomy. RADIATION DOSE REDUCTION: This exam was performed according to the departmental dose-optimization program which includes automated exposure control,  adjustment of the mA and/or kV according to patient size and/or use of iterative reconstruction technique. CONTRAST:  75mL OMNIPAQUE  IOHEXOL  350 MG/ML SOLN COMPARISON:  January 06, 2024 FINDINGS: VASCULAR Aorta: Aortic atherosclerosis without aneurysm or dissection. Celiac: Patent without evidence of aneurysm, dissection, vasculitis or significant stenosis. SMA: Patent without evidence of aneurysm, dissection, vasculitis or significant stenosis. Renals: Both renal arteries are patent without evidence of aneurysm, dissection, vasculitis, fibromuscular dysplasia or significant stenosis. IMA: Patent without evidence of aneurysm, dissection, vasculitis or significant stenosis. Inflow: Patent without evidence of aneurysm, dissection, vasculitis or significant stenosis. Proximal Outflow: Bilateral common femoral and visualized portions of the superficial and profunda femoral arteries are patent without evidence of aneurysm, dissection, vasculitis or significant stenosis. Veins: No obvious venous abnormality within the limitations of this arterial phase study. Review of the MIP images confirms the above findings. NON-VASCULAR Lower chest: No acute abnormality. Hepatobiliary: No focal liver abnormality is seen. No gallstones, gallbladder wall thickening, or biliary dilatation. Pancreas: Unremarkable. No pancreatic ductal dilatation or surrounding inflammatory changes. Spleen: Normal in size without focal abnormality. Adrenals/Urinary Tract: Stable right adrenal myelolipoma. Left adrenal gland is unremarkable. Stable bilateral renal cysts. No hydronephrosis or renal obstruction. Urinary bladder is decompressed secondary to Foley catheter. Large  left-sided diverticulum is again noted. Stomach/Bowel: The stomach is unremarkable. The appendix appears normal. There is no evidence of bowel obstruction. Diverticulosis is noted throughout the colon. Minimal inflammatory changes are seen adjacent to proximal sigmoid colon suggesting possible mild diverticulitis. Lymphatic: No adenopathy is noted. Reproductive: Prostatic calcifications are again noted and unchanged. Other: No ascites or hernia is noted. Musculoskeletal: Old L1 compression fracture is noted. IMPRESSION: 1. No evidence of active gastrointestinal bleeding. 2. Diverticulosis is noted throughout the colon. Minimal inflammatory changes are seen adjacent to proximal sigmoid colon suggesting possible mild diverticulitis. 3. Stable right adrenal myelolipoma. 4. Stable bilateral renal cysts. 5. Urinary bladder is decompressed secondary to Foley catheter. Large left-sided diverticulum is again noted. 6. Aortic atherosclerosis. Aortic Atherosclerosis (ICD10-I70.0). Electronically Signed   By: Lynwood Landy Raddle M.D.   On: 02/03/2024 15:40    Scheduled Meds:  budesonide -glycopyrrolate -formoterol   2 puff Inhalation BID   Chlorhexidine  Gluconate Cloth  6 each Topical Daily   feeding supplement  237 mL Oral BID BM   Gerhardt's butt cream   Topical BID   multivitamin with minerals  1 tablet Oral Daily   mouth rinse  15 mL Mouth Rinse TID   pantoprazole  (PROTONIX ) IV  40 mg Intravenous Q12H   Continuous Infusions:  lactated ringers         Unresulted Labs (From admission, onward)     Start     Ordered   02/04/24 1405  Hemoglobin and hematocrit, blood  Now then every 6 hours,   R (with TIMED occurrences)     Comments: Call MD for hemoglobin 9 or less   Question:  Specimen collection method  Answer:  IV Team=IV Team collect   02/04/24 1404             LOS:  LOS: 18 days   Time Spent: 60 minutes  Berdia Lachman Al-Sultani, MD [facility]  If 7PM-7AM, please contact  night-coverage  02/04/2024, 2:13 PM      "

## 2024-02-04 NOTE — Consult Note (Addendum)
 "   Consultation  Referring Provider: [facility]/ Al-Sultani Primary Care Physician:  [name], MD Primary Gastroenterologist:  Dr.Rourk  Reason for Consultation: Recurrent GI bleeding  HPI: Raymond Hurst is a 74 y.o. male with history of COPD, sleep apnea with CPAP use, stage IV lung cancer with adrenal metastasis, prior history of CVA, history of atrial fibrillation for which he has generally been on Eliquis . Patient was initially admitted at [facility] in early December with melena, he underwent EGD on 01/08/2024 and was found to have a clean-based duodenal ulcer.  Advised okay to resume his Eliquis .  He was then readmitted on 12/16/ 25 with hematemesis of bright red blood.  Associated dizziness and nausea found to have hemoglobin of 7.7.  He underwent repeat EGD which showed a lot of blood in the entire stomach, there was blood and clot in the duodenal bulb to the second portion of the duodenum, there was a large protrusion with surrounding eroded mucosa in the duodenal bulb, this was hemoclipped and Hemospray.  He was transfused, required intubation during the procedure, and then was admitted to the ICU.  He was then transferred to [facility] for further management and surgical consultation.  He went to the OR on 01/17/2024 and underwent exploratory lap, duodenotomy, ligation of the GDA and branches.  Postop course had been complicated by sepsis.  He has since also been diagnosed with a right cephalic and chronic DVT of the right brachial vein. Decision regarding ability to reinitiate anticoagulation had been discussed with surgery, and advised okay to restart DOAC on 02/01/2024.  Family was concerned about restarting anticoagulation and wanted cardiology to weigh in. He was seen by cardiology on 02/02/2023 who did recommend resuming anticoagulation in setting of his Chad Vasc score of 4 Eliquis  was restarted on 02/02/2023 and then stopped yesterday  Yesterday he apparently had been noted to  have a small amount of dark red blood per rectum. Earlier this morning he had an episode of what looks like melena with dark red blood in the commode  CT angio was done 01/03/2024 that showed pandiverticulosis, minimal inflammatory changes adjacent to the proximal sigmoid query mild diverticulitis.  Otherwise negative and no active bleeding  This afternoon he is just had a grossly melenic stool.  He says he tried to get up to the bathroom himself felt a little weak and dizzy. Has no complaints of abdominal pain or cramping, has been eating without difficulty.  No fever or chills.  Last dose of Eliquis  yesterday morning.  Labs today fortunately-show WBC 8.6/hemoglobin 15.3/hematocrit 46.5/MCV 90.3 Sodium 137/potassium 4.2/BUN 33/creatinine 1.46    Past Medical History:  Diagnosis Date   Adrenal tumor    right adrenal gland tumor favored to be a myelolipoma   Atrial fibrillation (HCC)    Cancer (HCC)    stage 4 Lung   Cellulitis of right lower leg    COPD (chronic obstructive pulmonary disease) (HCC)    Dysrhythmia    A-fib   GI bleed    History of TIA (transient ischemic attack)    May 2020   Lymphedema    OSA on CPAP    severe obstructive sleep apnea with an AHI of 54/h with no significant central events.  He had nocturnal hypoxemia with O2 sat nadir of 70%. Now on CPAP   Pulmonary mass    Stroke [facility])     Past Surgical History:  Procedure Laterality Date   BRONCHIAL BIOPSY  06/12/2023   Procedure: BRONCHOSCOPY,  WITH BIOPSY;  Surgeon: Shelah Lamar RAMAN, MD;  Location: [facility] ENDOSCOPY;  Service: Cardiopulmonary;;   BRONCHIAL BRUSHINGS  06/12/2023   Procedure: BRONCHOSCOPY, WITH BRUSH BIOPSY;  Surgeon: Shelah Lamar RAMAN, MD;  Location: [facility] ENDOSCOPY;  Service: Cardiopulmonary;;   BRONCHIAL NEEDLE ASPIRATION BIOPSY  06/12/2023   Procedure: BRONCHOSCOPY, WITH NEEDLE ASPIRATION BIOPSY;  Surgeon: Shelah Lamar RAMAN, MD;  Location: [facility] ENDOSCOPY;  Service: Cardiopulmonary;;   BRONCHIAL NEEDLE  ASPIRATION BIOPSY  08/28/2023   Procedure: BRONCHOSCOPY, WITH NEEDLE ASPIRATION BIOPSY;  Surgeon: Shelah Lamar RAMAN, MD;  Location: [facility] ENDOSCOPY;  Service: Pulmonary;;   CRYOTHERAPY  08/28/2023   Procedure: CRYOTHERAPY;  Surgeon: Shelah Lamar RAMAN, MD;  Location: [facility] ENDOSCOPY;  Service: Pulmonary;;   ESOPHAGOGASTRODUODENOSCOPY N/A 09/09/2023   Procedure: EGD (ESOPHAGOGASTRODUODENOSCOPY);  Surgeon: Shaaron Lamar HERO, MD;  Location: [facility] ENDO SUITE;  Service: Endoscopy;  Laterality: N/A;   ESOPHAGOGASTRODUODENOSCOPY N/A 01/08/2024   Procedure: EGD (ESOPHAGOGASTRODUODENOSCOPY);  Surgeon: Cindie Carlin POUR, DO;  Location: [facility] ENDO SUITE;  Service: Endoscopy;  Laterality: N/A;   ESOPHAGOGASTRODUODENOSCOPY N/A 01/17/2024   Procedure: EGD (ESOPHAGOGASTRODUODENOSCOPY);  Surgeon: Cindie Carlin POUR, DO;  Location: [facility] ENDO SUITE;  Service: Endoscopy;  Laterality: N/A;   HEMOSTASIS CLIP PLACEMENT  06/12/2023   Procedure: CONTROL OF HEMORRHAGE bronch cold saline and epi;  Surgeon: Shelah Lamar RAMAN, MD;  Location: [facility] ENDOSCOPY;  Service: Cardiopulmonary;;   HEMOSTASIS CLIP PLACEMENT  01/17/2024   Procedure: CONTROL OF HEMORRHAGE, GI TRACT, ENDOSCOPIC, BY CLIPPING OR OVERSEWING;  Surgeon: Cindie Carlin POUR, DO;  Location: [facility] ENDO SUITE;  Service: Endoscopy;;   INNER EAR SURGERY     IR ANGIOGRAM SELECTIVE EACH ADDITIONAL VESSEL  09/09/2023   IR ANGIOGRAM VISCERAL SELECTIVE  09/09/2023   IR ANGIOGRAM VISCERAL SELECTIVE  09/09/2023   IR EMBO ART  VEN HEMORR LYMPH EXTRAV  INC GUIDE ROADMAPPING  09/09/2023   IR THORACENTESIS ASP PLEURAL SPACE W/IMG GUIDE  09/26/2023   IR US  GUIDE VASC ACCESS RIGHT  09/09/2023   KNEE SURGERY Left    LAPAROTOMY N/A 01/17/2024   Procedure: LAPAROTOMY, EXPLORATORY;  Surgeon: Rubin Calamity, MD;  Location: [facility] OR;  Service: General;  Laterality: N/A;   LAPAROTOMY N/A 01/19/2024   Procedure: MACARIO LIVINGS;  Surgeon: Ebbie Cough, MD;  Location: [facility] OR;  Service: General;  Laterality: N/A;   POSSIBLE ABDOMINAL CLOSURE   THORACENTESIS Left 06/06/2023   Procedure: MILANA;  Surgeon: Arlinda Ranks, MD;  Location: [facility] ENDOSCOPY;  Service: Pulmonary;  Laterality: Left;   TONSILLECTOMY     VIDEO BRONCHOSCOPY WITH ENDOBRONCHIAL NAVIGATION Left 08/28/2023   Procedure: VIDEO BRONCHOSCOPY WITH ENDOBRONCHIAL NAVIGATION;  Surgeon: Shelah Lamar RAMAN, MD;  Location: [facility] ENDOSCOPY;  Service: Pulmonary;  Laterality: Left;   VIDEO BRONCHOSCOPY WITH ENDOBRONCHIAL ULTRASOUND Left 06/12/2023   Procedure: BRONCHOSCOPY, WITH EBUS;  Surgeon: Shelah Lamar RAMAN, MD;  Location: [facility] ENDOSCOPY;  Service: Cardiopulmonary;  Laterality: Left;   VIDEO BRONCHOSCOPY WITH ENDOBRONCHIAL ULTRASOUND  08/28/2023   Procedure: BRONCHOSCOPY, WITH EBUS;  Surgeon: Shelah Lamar RAMAN, MD;  Location: [facility] ENDOSCOPY;  Service: Pulmonary;;    Prior to Admission medications  Medication Sig Start Date End Date Taking? Authorizing Provider  acetaminophen  (TYLENOL ) 500 MG tablet Take 500 mg by mouth every 6 (six) hours as needed for mild pain (pain score 1-3).   Yes [provider]  allopurinol  (ZYLOPRIM ) 100 MG tablet Take 100 mg by mouth daily. 05/25/22  Yes [provider]  apixaban  (ELIQUIS ) 5 MG TABS tablet Take 1 tablet (5 mg total) by mouth 2 (  two) times daily. Okay to restart this medication on 08/29/2023 01/09/24  Yes Ricky Fines, MD  budesonide -glycopyrrolate -formoterol  (BREZTRI  AEROSPHERE) 160-9-4.8 MCG/ACT AERO inhaler Inhale 2 puffs into the lungs in the morning and at bedtime. 10/11/23  Yes Hattar, Zola SAILOR, MD  cholecalciferol  (VITAMIN D3) 25 MCG (1000 UNIT) tablet Take 1,000 Units by mouth daily.   Yes [provider]  cyanocobalamin  (VITAMIN B12) 100 MCG tablet Take 100 mcg by mouth daily.   Yes [provider]  folic acid  (FOLVITE ) 1 MG tablet TAKE 1 TABLET BY MOUTH EVERY DAY 12/07/23  Yes Rourk, Lamar HERO, MD  furosemide  (LASIX ) 20 MG tablet Take 10 mg by mouth daily.   Yes [provider]  ipratropium-albuterol  (DUONEB) 0.5-2.5 (3) MG/3ML SOLN Take 3 mLs by nebulization every 6 (six) hours as needed. 10/31/23  Yes Hattar, Zola SAILOR, MD  loperamide  (IMODIUM  A-D) 2 MG tablet Take 2-4 mg by mouth 4 (four) times daily as needed for diarrhea or loose stools.   Yes [provider]  metoprolol  tartrate (LOPRESSOR ) 25 MG tablet Take 1 tablet (25 mg total) by mouth 2 (two) times daily. 11/17/21  Yes Strader, Brittany M, PA-C  thiamine  (VITAMIN B-1) 100 MG tablet Take 100 mg by mouth daily.   Yes [provider]  pantoprazole  (PROTONIX ) 40 MG tablet TAKE 1 TABLET (40 MG TOTAL) BY MOUTH TWICE A DAY BEFORE MEALS 01/22/24   Shirlean Therisa ORN, NP    Current Facility-Administered Medications  Medication Dose Route Frequency Provider Last Rate Last Admin   acetaminophen  (TYLENOL ) suppository 650 mg  650 mg Rectal Q6H PRN Sharie Bourbon, MD       budesonide -glycopyrrolate -formoterol  (BREZTRI ) 160-9-4.8 MCG/ACT inhaler 2 puff  2 puff Inhalation BID Samtani, Jai-Gurmukh, MD   2 puff at 02/04/24 0900   Chlorhexidine  Gluconate Cloth 2 % PADS 6 each  6 each Topical Daily Ebbie Cough, MD   6 each at 02/04/24 1030   feeding supplement (ENSURE PLUS HIGH PROTEIN) liquid 237 mL  237 mL Oral BID BM Cindy Garnette POUR, MD   237 mL at 02/04/24 1030   Gerhardt's butt cream   Topical BID Cindy Garnette POUR, MD   Given at 02/04/24 0919   hydrALAZINE  (APRESOLINE ) injection 20 mg  20 mg Intravenous Q4H PRN Paliwal, Ria, MD   20 mg at 01/24/24 0831   HYDROcodone -acetaminophen  (NORCO/VICODIN) 5-325 MG per tablet 1 tablet  1 tablet Oral Q6H PRN Samtani, Jai-Gurmukh, MD       hydrOXYzine  (VISTARIL ) injection 25 mg  25 mg Intramuscular Q6H PRN Paliwal, Aditya, MD   25 mg at 01/24/24 0328   ipratropium-albuterol  (DUONEB) 0.5-2.5 (3) MG/3ML nebulizer solution 3 mL  3 mL Nebulization Q4H PRN Ebbie Cough, MD       labetalol  (NORMODYNE ) injection 20 mg  20 mg Intravenous Q2H PRN  Paliwal, Aditya, MD   20 mg at 01/24/24 0630   multivitamin with minerals tablet 1 tablet  1 tablet Oral Daily Chen, Lydia D, RPH   1 tablet at 02/04/24 9081   ondansetron  (ZOFRAN ) injection 4 mg  4 mg Intravenous Q6H PRN Ebbie Cough, MD       Oral care mouth rinse  15 mL Mouth Rinse TID Cindy Garnette POUR, MD   15 mL at 02/04/24 0919   pantoprazole  (PROTONIX ) injection 40 mg  40 mg Intravenous Q12H Esterwood, Amy S, PA-C       sodium chloride  flush (NS) 0.9 % injection 10-40 mL  10-40 mL Intracatheter PRN  Sharie Bourbon, MD   10 mL at 01/30/24 1735    Allergies as of 01/16/2024   (No Known Allergies)    Family History  Problem Relation Age of Onset   Heart failure Mother    COPD Mother    Atrial fibrillation Mother    Idiopathic pulmonary fibrosis Father     Social History   Socioeconomic History   Marital status: Married    Spouse name: Not on file   Number of children: Not on file   Years of education: Not on file   Highest education level: Not on file  Occupational History   Not on file  Tobacco Use   Smoking status: Former    Current packs/day: 0.00    Average packs/day: 2.0 packs/day for 20.0 years (40.0 ttl pk-yrs)    Types: Cigarettes    Start date: 01/31/1970    Quit date: 01/31/1990    Years since quitting: 34.0   Smokeless tobacco: Never  Vaping Use   Vaping status: Never Used  Substance and Sexual Activity   Alcohol use: Not Currently    Comment: 4-5 beer daily   Drug use: No   Sexual activity: Not on file  Other Topics Concern   Not on file  Social History Narrative   Right handed.    Social Drivers of Health   Tobacco Use: Medium Risk (01/19/2024)   Patient History    Smoking Tobacco Use: Former    Smokeless Tobacco Use: Never    Passive Exposure: Not on file  Financial Resource Strain: High Risk (06/15/2023)   Overall Financial Resource Strain (CARDIA)    Difficulty of Paying Living Expenses: Very hard  Food Insecurity: No Food Insecurity  (01/16/2024)   Epic    Worried About Programme Researcher, Broadcasting/film/video in the Last Year: Never true    Ran Out of Food in the Last Year: Never true  Transportation Needs: No Transportation Needs (01/16/2024)   Epic    Lack of Transportation (Medical): No    Lack of Transportation (Non-Medical): No  Physical Activity: Inactive (06/15/2023)   Exercise Vital Sign    Days of Exercise per Week: 0 days    Minutes of Exercise per Session: 0 min  Stress: No Stress Concern Present (06/15/2023)   [employer] of Occupational Health - Occupational Stress Questionnaire    Feeling of Stress : Only a little  Social Connections: Moderately Isolated (01/16/2024)   Social Connection and Isolation Panel    Frequency of Communication with Friends and Family: More than three times a week    Frequency of Social Gatherings with Friends and Family: More than three times a week    Attends Religious Services: Never    Database Administrator or Organizations: No    Attends Banker Meetings: Never    Marital Status: Married  Catering Manager Violence: Not At Risk (01/16/2024)   Epic    Fear of Current or Ex-Partner: No    Emotionally Abused: No    Physically Abused: No    Sexually Abused: No  Depression (PHQ2-9): Low Risk (01/12/2024)   Depression (PHQ2-9)    PHQ-2 Score: 0  Alcohol Screen: Low Risk (06/15/2023)   Alcohol Screen    Last Alcohol Screening Score (AUDIT): 5  Housing: Low Risk (01/26/2024)   Epic    Unable to Pay for Housing in the Last Year: No    Number of Times Moved in the Last Year: 0    Homeless in the  Last Year: No  Utilities: Not At Risk (01/16/2024)   Epic    Threatened with loss of utilities: No  Health Literacy: Not on file    Review of Systems: Pertinent positive and negative review of systems were noted in the above HPI section.  All other review of systems was otherwise negative.   Physical Exam: Vital signs in last 24 hours: Temp:  [97.4 F (36.3 C)-98.6 F (37  C)] 98.6 F (37 C) (01/04 0737) Pulse Rate:  [80-95] 95 (01/04 0737) Resp:  [18-19] 18 (01/04 0737) BP: (114-128)/(74-82) 128/82 (01/04 0737) SpO2:  [95 %-100 %] 100 % (01/04 0737) Last BM Date : 02/04/24 General:   Alert,  Well-developed, older somewhat ill-appearing white male pleasant and cooperative in NAD a bit hard of hearing Head:  Normocephalic and atraumatic. Eyes:  Sclera clear, no icterus.   Conjunctiva pink. Ears:  Normal auditory acuity. Nose:  No deformity, discharge,  or lesions. Mouth:  No deformity or lesions.   Neck:  Supple; no masses or thyromegaly. Lungs:  Clear throughout to auscultation.   No wheezes, crackles, or rhonchi.  Heart:  Regular rate and rhythm; no murmurs, clicks, rubs,  or gallops. Abdomen:  Soft,nontender, BS active,nonpalp mass or hsm.   Rectal: Not done, just had a grossly melenic stool, picture in chart Msk:  Symmetrical without gross deformities. . Pulses:  Normal pulses noted. Extremities:  Without clubbing or edema. Neurologic:  Alert and  oriented x4;  grossly normal neurologically. Skin:  Intact without significant lesions or rashes.. Psych:  Alert and cooperative. Normal mood and affect.  Intake/Output from previous day: 01/03 0701 - 01/04 0700 In: 1619.9 [P.O.:630; I.V.:989.9] Out: 5200 [Urine:5200] Intake/Output this shift: No intake/output data recorded.  Lab Results: Recent Labs    02/02/24 0326 02/03/24 1129 02/04/24 0527  WBC 7.4 7.7 8.6  HGB 15.7 16.5 15.3  HCT 47.2 49.8 46.5  PLT 193 211 197   BMET Recent Labs    02/02/24 0326 02/03/24 1225 02/04/24 0527  NA 137 135 137  K 4.0 4.5 4.2  CL 103 102 102  CO2 22 21* 23  GLUCOSE 120* 132* 99  BUN 33* 36* 33*  CREATININE 1.32* 1.43* 1.46*  CALCIUM  8.9 9.1 9.0   LFT No results for input(s): PROT, ALBUMIN, AST, ALT, ALKPHOS, BILITOT, BILIDIR, IBILI in the last 72 hours. PT/INR No results for input(s): LABPROT, INR in the last 72  hours. Hepatitis Panel No results for input(s): HEPBSAG, HCVAB, HEPAIGM, HEPBIGM in the last 72 hours.    IMPRESSION:  #88 74 year old white male with major upper GI bleed secondary to duodenal ulcer on 01/16/2024, transferred to [facility] post endoscopic procedure, and intubation. He was found to have a large visible vessel with surrounding eroded mucosa in the duodenal bulb which was clipped and Hemospray to at endoscopy.  He went to the OR on 01/17/2024 for exploratory lap, duodenotomy, and ligation of the GDA and branches.  Patient had previously been anticoagulated with Eliquis  in setting of A-fib and prior CVA.  Eliquis  had been held until a few days ago, after surgery cleared for reinitiation of anticoagulation. Cardiology also saw him and due to his elevated Chad VAS score but that it was in his best interest to go back on Eliquis .  His last dose of Eliquis  was yesterday morning, held after he had had the small bloody bowel movement  He does appear to be having a recurrent GI bleed though hemoglobin still in the normal range.  Movement this afternoon was grossly melenic.  Unfortunately suspect he is having recurrent upper GI bleeding   #2 COPD 3.  Sleep apnea with CPAP use 4.  History of lung cancer stage IV with adrenal met 5.  Atrial fibrillation 6.  Prior CVA #7 chronic kidney disease  Plan; bedrest up with assist only to bedside commode Change PPI back to IV PPI twice daily 6-hour hemoglobins, transfuse as indicated Clear liquid diet, n.p.o. after midnight.  Patient did eat solid food at lunchtime today Will discuss further with surgery, but anticipate he will require EGD in a.m. with Dr. Stacia.  He is currently 18 days out from surgery GI will follow with you   Amy EsterwoodPA-C 02/04/2024, 2:05 PM  I have taken an interval history, thoroughly reviewed the chart and examined the patient. I agree with the Advanced Practitioner's note, impression and  recommendations, and have recorded additional findings, impressions and recommendations below. I performed a substantive portion of this encounter (>50% time spent), including a complete performance of the medical decision making.  My additional thoughts are as follows:  Complex case of recurrent GI bleeding in the setting of known duodenal ulcer that required surgical management.  Unfortunately bleeding resumed shortly after resuming oral anticoagulation with a history of A-fib and new upper extremity DVT. He had some maroon blood of a smaller volume yesterday then today had a large amount of melena witnessed by our PA (photos taken). Fortunately, he remains hemodynamically stable at the present time with a hemoglobin of 15.3.  CT angiogram yesterday did not show active extravasation. May be incidental diverticular wall thickening on that scan, but no tenderness in that area. This is all particularly worrisome for rebleeding from the ulcer site triggered by anticoagulation. Plan is for an upper endoscopy tomorrow with Dr. Glendia Stacia.  (Patient had solid food this afternoon and Eliquis  this morning) I spoke to this patient while he also had his wife on speaker phone and explained the challenging situation that we are in.  They are aware that this upper endoscopy is a high risk procedure, not only from the standpoint of his cardiac disease but most notably due to the increased perforation risk from recent surgery.  Fortunately the scope system uses CO2 insufflation and it is expected to be a diagnostic procedure only without expectation of complex endoscopic hemostasis interventions.  (Perhaps hemostatic gel or powder may be used as a temporizing measure if active bleeding is seen and while contacting the surgical service)  Acid suppression, serial hemoglobin and hematocrit, close monitoring in the interim. He has still been followed by the surgical service, I spoke with Dr. Dann Hummer this  afternoon to update them on our plan. _________________  This consultation required a high degree of medical decision making due to the nature and complexity of the acute condition(s) being evaluated as well as the patient's medical comorbidities.  Victory LITTIE Brand III Office:[phone]     "

## 2024-02-04 NOTE — Progress Notes (Signed)
 Patient had BM this am. Toilet bowl full of bright red blood and stool. Pt vital signs are as follow.  T: 98.6 B/P: 128/82 MAP: 93 RR: 18 O2: 100% RA  Photo taken for media. MD notified.

## 2024-02-04 NOTE — H&P (View-Only) (Signed)
 "   Consultation  Referring Provider: [facility]/ Al-Sultani Primary Care Physician:  [name], MD Primary Gastroenterologist:  Dr.Rourk  Reason for Consultation: Recurrent GI bleeding  HPI: Raymond Hurst is a 74 y.o. male with history of COPD, sleep apnea with CPAP use, stage IV lung cancer with adrenal metastasis, prior history of CVA, history of atrial fibrillation for which he has generally been on Eliquis . Patient was initially admitted at [facility] in early December with melena, he underwent EGD on 01/08/2024 and was found to have a clean-based duodenal ulcer.  Advised okay to resume his Eliquis .  He was then readmitted on 12/16/ 25 with hematemesis of bright red blood.  Associated dizziness and nausea found to have hemoglobin of 7.7.  He underwent repeat EGD which showed a lot of blood in the entire stomach, there was blood and clot in the duodenal bulb to the second portion of the duodenum, there was a large protrusion with surrounding eroded mucosa in the duodenal bulb, this was hemoclipped and Hemospray.  He was transfused, required intubation during the procedure, and then was admitted to the ICU.  He was then transferred to [facility] for further management and surgical consultation.  He went to the OR on 01/17/2024 and underwent exploratory lap, duodenotomy, ligation of the GDA and branches.  Postop course had been complicated by sepsis.  He has since also been diagnosed with a right cephalic and chronic DVT of the right brachial vein. Decision regarding ability to reinitiate anticoagulation had been discussed with surgery, and advised okay to restart DOAC on 02/01/2024.  Family was concerned about restarting anticoagulation and wanted cardiology to weigh in. He was seen by cardiology on 02/02/2023 who did recommend resuming anticoagulation in setting of his Chad Vasc score of 4 Eliquis  was restarted on 02/02/2023 and then stopped yesterday  Yesterday he apparently had been noted to  have a small amount of dark red blood per rectum. Earlier this morning he had an episode of what looks like melena with dark red blood in the commode  CT angio was done 01/03/2024 that showed pandiverticulosis, minimal inflammatory changes adjacent to the proximal sigmoid query mild diverticulitis.  Otherwise negative and no active bleeding  This afternoon he is just had a grossly melenic stool.  He says he tried to get up to the bathroom himself felt a little weak and dizzy. Has no complaints of abdominal pain or cramping, has been eating without difficulty.  No fever or chills.  Last dose of Eliquis  yesterday morning.  Labs today fortunately-show WBC 8.6/hemoglobin 15.3/hematocrit 46.5/MCV 90.3 Sodium 137/potassium 4.2/BUN 33/creatinine 1.46    Past Medical History:  Diagnosis Date   Adrenal tumor    right adrenal gland tumor favored to be a myelolipoma   Atrial fibrillation (HCC)    Cancer (HCC)    stage 4 Lung   Cellulitis of right lower leg    COPD (chronic obstructive pulmonary disease) (HCC)    Dysrhythmia    A-fib   GI bleed    History of TIA (transient ischemic attack)    May 2020   Lymphedema    OSA on CPAP    severe obstructive sleep apnea with an AHI of 54/h with no significant central events.  He had nocturnal hypoxemia with O2 sat nadir of 70%. Now on CPAP   Pulmonary mass    Stroke [facility])     Past Surgical History:  Procedure Laterality Date   BRONCHIAL BIOPSY  06/12/2023   Procedure: BRONCHOSCOPY,  WITH BIOPSY;  Surgeon: Shelah Lamar RAMAN, MD;  Location: [facility] ENDOSCOPY;  Service: Cardiopulmonary;;   BRONCHIAL BRUSHINGS  06/12/2023   Procedure: BRONCHOSCOPY, WITH BRUSH BIOPSY;  Surgeon: Shelah Lamar RAMAN, MD;  Location: [facility] ENDOSCOPY;  Service: Cardiopulmonary;;   BRONCHIAL NEEDLE ASPIRATION BIOPSY  06/12/2023   Procedure: BRONCHOSCOPY, WITH NEEDLE ASPIRATION BIOPSY;  Surgeon: Shelah Lamar RAMAN, MD;  Location: [facility] ENDOSCOPY;  Service: Cardiopulmonary;;   BRONCHIAL NEEDLE  ASPIRATION BIOPSY  08/28/2023   Procedure: BRONCHOSCOPY, WITH NEEDLE ASPIRATION BIOPSY;  Surgeon: Shelah Lamar RAMAN, MD;  Location: [facility] ENDOSCOPY;  Service: Pulmonary;;   CRYOTHERAPY  08/28/2023   Procedure: CRYOTHERAPY;  Surgeon: Shelah Lamar RAMAN, MD;  Location: [facility] ENDOSCOPY;  Service: Pulmonary;;   ESOPHAGOGASTRODUODENOSCOPY N/A 09/09/2023   Procedure: EGD (ESOPHAGOGASTRODUODENOSCOPY);  Surgeon: Shaaron Lamar HERO, MD;  Location: [facility] ENDO SUITE;  Service: Endoscopy;  Laterality: N/A;   ESOPHAGOGASTRODUODENOSCOPY N/A 01/08/2024   Procedure: EGD (ESOPHAGOGASTRODUODENOSCOPY);  Surgeon: Cindie Carlin POUR, DO;  Location: [facility] ENDO SUITE;  Service: Endoscopy;  Laterality: N/A;   ESOPHAGOGASTRODUODENOSCOPY N/A 01/17/2024   Procedure: EGD (ESOPHAGOGASTRODUODENOSCOPY);  Surgeon: Cindie Carlin POUR, DO;  Location: [facility] ENDO SUITE;  Service: Endoscopy;  Laterality: N/A;   HEMOSTASIS CLIP PLACEMENT  06/12/2023   Procedure: CONTROL OF HEMORRHAGE bronch cold saline and epi;  Surgeon: Shelah Lamar RAMAN, MD;  Location: [facility] ENDOSCOPY;  Service: Cardiopulmonary;;   HEMOSTASIS CLIP PLACEMENT  01/17/2024   Procedure: CONTROL OF HEMORRHAGE, GI TRACT, ENDOSCOPIC, BY CLIPPING OR OVERSEWING;  Surgeon: Cindie Carlin POUR, DO;  Location: [facility] ENDO SUITE;  Service: Endoscopy;;   INNER EAR SURGERY     IR ANGIOGRAM SELECTIVE EACH ADDITIONAL VESSEL  09/09/2023   IR ANGIOGRAM VISCERAL SELECTIVE  09/09/2023   IR ANGIOGRAM VISCERAL SELECTIVE  09/09/2023   IR EMBO ART  VEN HEMORR LYMPH EXTRAV  INC GUIDE ROADMAPPING  09/09/2023   IR THORACENTESIS ASP PLEURAL SPACE W/IMG GUIDE  09/26/2023   IR US  GUIDE VASC ACCESS RIGHT  09/09/2023   KNEE SURGERY Left    LAPAROTOMY N/A 01/17/2024   Procedure: LAPAROTOMY, EXPLORATORY;  Surgeon: Rubin Calamity, MD;  Location: [facility] OR;  Service: General;  Laterality: N/A;   LAPAROTOMY N/A 01/19/2024   Procedure: MACARIO LIVINGS;  Surgeon: Ebbie Cough, MD;  Location: [facility] OR;  Service: General;  Laterality: N/A;   POSSIBLE ABDOMINAL CLOSURE   THORACENTESIS Left 06/06/2023   Procedure: MILANA;  Surgeon: Arlinda Ranks, MD;  Location: [facility] ENDOSCOPY;  Service: Pulmonary;  Laterality: Left;   TONSILLECTOMY     VIDEO BRONCHOSCOPY WITH ENDOBRONCHIAL NAVIGATION Left 08/28/2023   Procedure: VIDEO BRONCHOSCOPY WITH ENDOBRONCHIAL NAVIGATION;  Surgeon: Shelah Lamar RAMAN, MD;  Location: [facility] ENDOSCOPY;  Service: Pulmonary;  Laterality: Left;   VIDEO BRONCHOSCOPY WITH ENDOBRONCHIAL ULTRASOUND Left 06/12/2023   Procedure: BRONCHOSCOPY, WITH EBUS;  Surgeon: Shelah Lamar RAMAN, MD;  Location: [facility] ENDOSCOPY;  Service: Cardiopulmonary;  Laterality: Left;   VIDEO BRONCHOSCOPY WITH ENDOBRONCHIAL ULTRASOUND  08/28/2023   Procedure: BRONCHOSCOPY, WITH EBUS;  Surgeon: Shelah Lamar RAMAN, MD;  Location: [facility] ENDOSCOPY;  Service: Pulmonary;;    Prior to Admission medications  Medication Sig Start Date End Date Taking? Authorizing Provider  acetaminophen  (TYLENOL ) 500 MG tablet Take 500 mg by mouth every 6 (six) hours as needed for mild pain (pain score 1-3).   Yes [provider]  allopurinol  (ZYLOPRIM ) 100 MG tablet Take 100 mg by mouth daily. 05/25/22  Yes [provider]  apixaban  (ELIQUIS ) 5 MG TABS tablet Take 1 tablet (5 mg total) by mouth 2 (  two) times daily. Okay to restart this medication on 08/29/2023 01/09/24  Yes Ricky Fines, MD  budesonide -glycopyrrolate -formoterol  (BREZTRI  AEROSPHERE) 160-9-4.8 MCG/ACT AERO inhaler Inhale 2 puffs into the lungs in the morning and at bedtime. 10/11/23  Yes Hattar, Zola SAILOR, MD  cholecalciferol  (VITAMIN D3) 25 MCG (1000 UNIT) tablet Take 1,000 Units by mouth daily.   Yes [provider]  cyanocobalamin  (VITAMIN B12) 100 MCG tablet Take 100 mcg by mouth daily.   Yes [provider]  folic acid  (FOLVITE ) 1 MG tablet TAKE 1 TABLET BY MOUTH EVERY DAY 12/07/23  Yes Rourk, Lamar HERO, MD  furosemide  (LASIX ) 20 MG tablet Take 10 mg by mouth daily.   Yes [provider]  ipratropium-albuterol  (DUONEB) 0.5-2.5 (3) MG/3ML SOLN Take 3 mLs by nebulization every 6 (six) hours as needed. 10/31/23  Yes Hattar, Zola SAILOR, MD  loperamide  (IMODIUM  A-D) 2 MG tablet Take 2-4 mg by mouth 4 (four) times daily as needed for diarrhea or loose stools.   Yes [provider]  metoprolol  tartrate (LOPRESSOR ) 25 MG tablet Take 1 tablet (25 mg total) by mouth 2 (two) times daily. 11/17/21  Yes Strader, Brittany M, PA-C  thiamine  (VITAMIN B-1) 100 MG tablet Take 100 mg by mouth daily.   Yes [provider]  pantoprazole  (PROTONIX ) 40 MG tablet TAKE 1 TABLET (40 MG TOTAL) BY MOUTH TWICE A DAY BEFORE MEALS 01/22/24   Shirlean Therisa ORN, NP    Current Facility-Administered Medications  Medication Dose Route Frequency Provider Last Rate Last Admin   acetaminophen  (TYLENOL ) suppository 650 mg  650 mg Rectal Q6H PRN Sharie Bourbon, MD       budesonide -glycopyrrolate -formoterol  (BREZTRI ) 160-9-4.8 MCG/ACT inhaler 2 puff  2 puff Inhalation BID Samtani, Jai-Gurmukh, MD   2 puff at 02/04/24 0900   Chlorhexidine  Gluconate Cloth 2 % PADS 6 each  6 each Topical Daily Ebbie Cough, MD   6 each at 02/04/24 1030   feeding supplement (ENSURE PLUS HIGH PROTEIN) liquid 237 mL  237 mL Oral BID BM Cindy Garnette POUR, MD   237 mL at 02/04/24 1030   Gerhardt's butt cream   Topical BID Cindy Garnette POUR, MD   Given at 02/04/24 0919   hydrALAZINE  (APRESOLINE ) injection 20 mg  20 mg Intravenous Q4H PRN Paliwal, Ria, MD   20 mg at 01/24/24 0831   HYDROcodone -acetaminophen  (NORCO/VICODIN) 5-325 MG per tablet 1 tablet  1 tablet Oral Q6H PRN Samtani, Jai-Gurmukh, MD       hydrOXYzine  (VISTARIL ) injection 25 mg  25 mg Intramuscular Q6H PRN Paliwal, Aditya, MD   25 mg at 01/24/24 0328   ipratropium-albuterol  (DUONEB) 0.5-2.5 (3) MG/3ML nebulizer solution 3 mL  3 mL Nebulization Q4H PRN Ebbie Cough, MD       labetalol  (NORMODYNE ) injection 20 mg  20 mg Intravenous Q2H PRN  Paliwal, Aditya, MD   20 mg at 01/24/24 0630   multivitamin with minerals tablet 1 tablet  1 tablet Oral Daily Chen, Lydia D, RPH   1 tablet at 02/04/24 9081   ondansetron  (ZOFRAN ) injection 4 mg  4 mg Intravenous Q6H PRN Ebbie Cough, MD       Oral care mouth rinse  15 mL Mouth Rinse TID Cindy Garnette POUR, MD   15 mL at 02/04/24 0919   pantoprazole  (PROTONIX ) injection 40 mg  40 mg Intravenous Q12H Esterwood, Amy S, PA-C       sodium chloride  flush (NS) 0.9 % injection 10-40 mL  10-40 mL Intracatheter PRN  Sharie Bourbon, MD   10 mL at 01/30/24 1735    Allergies as of 01/16/2024   (No Known Allergies)    Family History  Problem Relation Age of Onset   Heart failure Mother    COPD Mother    Atrial fibrillation Mother    Idiopathic pulmonary fibrosis Father     Social History   Socioeconomic History   Marital status: Married    Spouse name: Not on file   Number of children: Not on file   Years of education: Not on file   Highest education level: Not on file  Occupational History   Not on file  Tobacco Use   Smoking status: Former    Current packs/day: 0.00    Average packs/day: 2.0 packs/day for 20.0 years (40.0 ttl pk-yrs)    Types: Cigarettes    Start date: 01/31/1970    Quit date: 01/31/1990    Years since quitting: 34.0   Smokeless tobacco: Never  Vaping Use   Vaping status: Never Used  Substance and Sexual Activity   Alcohol use: Not Currently    Comment: 4-5 beer daily   Drug use: No   Sexual activity: Not on file  Other Topics Concern   Not on file  Social History Narrative   Right handed.    Social Drivers of Health   Tobacco Use: Medium Risk (01/19/2024)   Patient History    Smoking Tobacco Use: Former    Smokeless Tobacco Use: Never    Passive Exposure: Not on file  Financial Resource Strain: High Risk (06/15/2023)   Overall Financial Resource Strain (CARDIA)    Difficulty of Paying Living Expenses: Very hard  Food Insecurity: No Food Insecurity  (01/16/2024)   Epic    Worried About Programme Researcher, Broadcasting/film/video in the Last Year: Never true    Ran Out of Food in the Last Year: Never true  Transportation Needs: No Transportation Needs (01/16/2024)   Epic    Lack of Transportation (Medical): No    Lack of Transportation (Non-Medical): No  Physical Activity: Inactive (06/15/2023)   Exercise Vital Sign    Days of Exercise per Week: 0 days    Minutes of Exercise per Session: 0 min  Stress: No Stress Concern Present (06/15/2023)   [employer] of Occupational Health - Occupational Stress Questionnaire    Feeling of Stress : Only a little  Social Connections: Moderately Isolated (01/16/2024)   Social Connection and Isolation Panel    Frequency of Communication with Friends and Family: More than three times a week    Frequency of Social Gatherings with Friends and Family: More than three times a week    Attends Religious Services: Never    Database Administrator or Organizations: No    Attends Banker Meetings: Never    Marital Status: Married  Catering Manager Violence: Not At Risk (01/16/2024)   Epic    Fear of Current or Ex-Partner: No    Emotionally Abused: No    Physically Abused: No    Sexually Abused: No  Depression (PHQ2-9): Low Risk (01/12/2024)   Depression (PHQ2-9)    PHQ-2 Score: 0  Alcohol Screen: Low Risk (06/15/2023)   Alcohol Screen    Last Alcohol Screening Score (AUDIT): 5  Housing: Low Risk (01/26/2024)   Epic    Unable to Pay for Housing in the Last Year: No    Number of Times Moved in the Last Year: 0    Homeless in the  Last Year: No  Utilities: Not At Risk (01/16/2024)   Epic    Threatened with loss of utilities: No  Health Literacy: Not on file    Review of Systems: Pertinent positive and negative review of systems were noted in the above HPI section.  All other review of systems was otherwise negative.   Physical Exam: Vital signs in last 24 hours: Temp:  [97.4 F (36.3 C)-98.6 F (37  C)] 98.6 F (37 C) (01/04 0737) Pulse Rate:  [80-95] 95 (01/04 0737) Resp:  [18-19] 18 (01/04 0737) BP: (114-128)/(74-82) 128/82 (01/04 0737) SpO2:  [95 %-100 %] 100 % (01/04 0737) Last BM Date : 02/04/24 General:   Alert,  Well-developed, older somewhat ill-appearing white male pleasant and cooperative in NAD a bit hard of hearing Head:  Normocephalic and atraumatic. Eyes:  Sclera clear, no icterus.   Conjunctiva pink. Ears:  Normal auditory acuity. Nose:  No deformity, discharge,  or lesions. Mouth:  No deformity or lesions.   Neck:  Supple; no masses or thyromegaly. Lungs:  Clear throughout to auscultation.   No wheezes, crackles, or rhonchi.  Heart:  Regular rate and rhythm; no murmurs, clicks, rubs,  or gallops. Abdomen:  Soft,nontender, BS active,nonpalp mass or hsm.   Rectal: Not done, just had a grossly melenic stool, picture in chart Msk:  Symmetrical without gross deformities. . Pulses:  Normal pulses noted. Extremities:  Without clubbing or edema. Neurologic:  Alert and  oriented x4;  grossly normal neurologically. Skin:  Intact without significant lesions or rashes.. Psych:  Alert and cooperative. Normal mood and affect.  Intake/Output from previous day: 01/03 0701 - 01/04 0700 In: 1619.9 [P.O.:630; I.V.:989.9] Out: 5200 [Urine:5200] Intake/Output this shift: No intake/output data recorded.  Lab Results: Recent Labs    02/02/24 0326 02/03/24 1129 02/04/24 0527  WBC 7.4 7.7 8.6  HGB 15.7 16.5 15.3  HCT 47.2 49.8 46.5  PLT 193 211 197   BMET Recent Labs    02/02/24 0326 02/03/24 1225 02/04/24 0527  NA 137 135 137  K 4.0 4.5 4.2  CL 103 102 102  CO2 22 21* 23  GLUCOSE 120* 132* 99  BUN 33* 36* 33*  CREATININE 1.32* 1.43* 1.46*  CALCIUM  8.9 9.1 9.0   LFT No results for input(s): PROT, ALBUMIN, AST, ALT, ALKPHOS, BILITOT, BILIDIR, IBILI in the last 72 hours. PT/INR No results for input(s): LABPROT, INR in the last 72  hours. Hepatitis Panel No results for input(s): HEPBSAG, HCVAB, HEPAIGM, HEPBIGM in the last 72 hours.    IMPRESSION:  #88 74 year old white male with major upper GI bleed secondary to duodenal ulcer on 01/16/2024, transferred to [facility] post endoscopic procedure, and intubation. He was found to have a large visible vessel with surrounding eroded mucosa in the duodenal bulb which was clipped and Hemospray to at endoscopy.  He went to the OR on 01/17/2024 for exploratory lap, duodenotomy, and ligation of the GDA and branches.  Patient had previously been anticoagulated with Eliquis  in setting of A-fib and prior CVA.  Eliquis  had been held until a few days ago, after surgery cleared for reinitiation of anticoagulation. Cardiology also saw him and due to his elevated Chad VAS score but that it was in his best interest to go back on Eliquis .  His last dose of Eliquis  was yesterday morning, held after he had had the small bloody bowel movement  He does appear to be having a recurrent GI bleed though hemoglobin still in the normal range.  Movement this afternoon was grossly melenic.  Unfortunately suspect he is having recurrent upper GI bleeding   #2 COPD 3.  Sleep apnea with CPAP use 4.  History of lung cancer stage IV with adrenal met 5.  Atrial fibrillation 6.  Prior CVA #7 chronic kidney disease  Plan; bedrest up with assist only to bedside commode Change PPI back to IV PPI twice daily 6-hour hemoglobins, transfuse as indicated Clear liquid diet, n.p.o. after midnight.  Patient did eat solid food at lunchtime today Will discuss further with surgery, but anticipate he will require EGD in a.m. with Dr. Stacia.  He is currently 18 days out from surgery GI will follow with you   Amy EsterwoodPA-C 02/04/2024, 2:05 PM  I have taken an interval history, thoroughly reviewed the chart and examined the patient. I agree with the Advanced Practitioner's note, impression and  recommendations, and have recorded additional findings, impressions and recommendations below. I performed a substantive portion of this encounter (>50% time spent), including a complete performance of the medical decision making.  My additional thoughts are as follows:  Complex case of recurrent GI bleeding in the setting of known duodenal ulcer that required surgical management.  Unfortunately bleeding resumed shortly after resuming oral anticoagulation with a history of A-fib and new upper extremity DVT. He had some maroon blood of a smaller volume yesterday then today had a large amount of melena witnessed by our PA (photos taken). Fortunately, he remains hemodynamically stable at the present time with a hemoglobin of 15.3.  CT angiogram yesterday did not show active extravasation. May be incidental diverticular wall thickening on that scan, but no tenderness in that area. This is all particularly worrisome for rebleeding from the ulcer site triggered by anticoagulation. Plan is for an upper endoscopy tomorrow with Dr. Glendia Stacia.  (Patient had solid food this afternoon and Eliquis  this morning) I spoke to this patient while he also had his wife on speaker phone and explained the challenging situation that we are in.  They are aware that this upper endoscopy is a high risk procedure, not only from the standpoint of his cardiac disease but most notably due to the increased perforation risk from recent surgery.  Fortunately the scope system uses CO2 insufflation and it is expected to be a diagnostic procedure only without expectation of complex endoscopic hemostasis interventions.  (Perhaps hemostatic gel or powder may be used as a temporizing measure if active bleeding is seen and while contacting the surgical service)  Acid suppression, serial hemoglobin and hematocrit, close monitoring in the interim. He has still been followed by the surgical service, I spoke with Dr. Dann Hummer this  afternoon to update them on our plan. _________________  This consultation required a high degree of medical decision making due to the nature and complexity of the acute condition(s) being evaluated as well as the patient's medical comorbidities.  Victory LITTIE Brand III Office:[phone]     "

## 2024-02-05 ENCOUNTER — Encounter (HOSPITAL_COMMUNITY): Payer: Self-pay | Admitting: Internal Medicine

## 2024-02-05 ENCOUNTER — Encounter (HOSPITAL_COMMUNITY)
Admission: EM | Disposition: A | Discharge status: Home / Self Care | Payer: Self-pay | Source: Home / Self Care | Attending: Internal Medicine

## 2024-02-05 ENCOUNTER — Inpatient Hospital Stay (HOSPITAL_COMMUNITY)

## 2024-02-05 DIAGNOSIS — J449 Chronic obstructive pulmonary disease, unspecified: Secondary | ICD-10-CM

## 2024-02-05 DIAGNOSIS — I4891 Unspecified atrial fibrillation: Secondary | ICD-10-CM | POA: Diagnosis not present

## 2024-02-05 DIAGNOSIS — K3189 Other diseases of stomach and duodenum: Secondary | ICD-10-CM

## 2024-02-05 DIAGNOSIS — T183XXA Foreign body in small intestine, initial encounter: Secondary | ICD-10-CM | POA: Diagnosis not present

## 2024-02-05 DIAGNOSIS — K2970 Gastritis, unspecified, without bleeding: Secondary | ICD-10-CM

## 2024-02-05 DIAGNOSIS — Z9189 Other specified personal risk factors, not elsewhere classified: Secondary | ICD-10-CM

## 2024-02-05 DIAGNOSIS — K264 Chronic or unspecified duodenal ulcer with hemorrhage: Secondary | ICD-10-CM

## 2024-02-05 DIAGNOSIS — K921 Melena: Secondary | ICD-10-CM | POA: Diagnosis not present

## 2024-02-05 DIAGNOSIS — Z87891 Personal history of nicotine dependence: Secondary | ICD-10-CM | POA: Diagnosis not present

## 2024-02-05 DIAGNOSIS — I82629 Acute embolism and thrombosis of deep veins of unspecified upper extremity: Secondary | ICD-10-CM

## 2024-02-05 HISTORY — PX: ESOPHAGOGASTRODUODENOSCOPY: SHX5428

## 2024-02-05 LAB — BASIC METABOLIC PANEL WITH GFR
Anion gap: 10 (ref 5–15)
BUN: 33 mg/dL — ABNORMAL HIGH (ref 8–23)
CO2: 26 mmol/L (ref 22–32)
Calcium: 9.2 mg/dL (ref 8.9–10.3)
Chloride: 102 mmol/L (ref 98–111)
Creatinine, Ser: 1.37 mg/dL — ABNORMAL HIGH (ref 0.61–1.24)
GFR, Estimated: 55 mL/min — ABNORMAL LOW
Glucose, Bld: 104 mg/dL — ABNORMAL HIGH (ref 70–99)
Potassium: 4.4 mmol/L (ref 3.5–5.1)
Sodium: 138 mmol/L (ref 135–145)

## 2024-02-05 LAB — HEMOGLOBIN AND HEMATOCRIT, BLOOD
HCT: 46.2 % (ref 39.0–52.0)
HCT: 43.2 % (ref 39.0–52.0)
HCT: 42.8 % (ref 39.0–52.0)
Hemoglobin: 15.3 g/dL (ref 13.0–17.0)
Hemoglobin: 14.2 g/dL (ref 13.0–17.0)
Hemoglobin: 14.4 g/dL (ref 13.0–17.0)

## 2024-02-05 LAB — HEPARIN LEVEL (UNFRACTIONATED): Heparin Unfractionated: 0.68 [IU]/mL (ref 0.30–0.70)

## 2024-02-05 MED ORDER — SODIUM CHLORIDE 0.9 % IV SOLN: INTRAVENOUS | Status: DC | PRN

## 2024-02-05 MED ORDER — PHENYLEPHRINE 80 MCG/ML (10ML) SYRINGE FOR IV PUSH (FOR BLOOD PRESSURE SUPPORT)
PREFILLED_SYRINGE | INTRAVENOUS | Status: DC | PRN
  Administered 2024-02-05: 160 ug via INTRAVENOUS

## 2024-02-05 MED ORDER — LIDOCAINE 2% (20 MG/ML) 5 ML SYRINGE
INTRAMUSCULAR | Status: DC | PRN
  Administered 2024-02-05: 100 mg via INTRAVENOUS

## 2024-02-05 MED ORDER — PROPOFOL 500 MG/50ML IV EMUL
INTRAVENOUS | Status: DC | PRN
  Administered 2024-02-05: 120 ug/kg/min via INTRAVENOUS
  Administered 2024-02-05: 30 mg via INTRAVENOUS
  Administered 2024-02-05: 20 mg via INTRAVENOUS
  Administered 2024-02-05: 40 mg via INTRAVENOUS

## 2024-02-05 MED ORDER — SUCRALFATE 1 G PO TABS
1.0000 g | ORAL_TABLET | Freq: Three times a day (TID) | ORAL | Status: AC
  Administered 2024-02-05 – 2024-02-19 (×55): 1 g via ORAL
  Filled 2024-02-05 (×55): qty 1

## 2024-02-05 MED ORDER — HEPARIN (PORCINE) 25000 UT/250ML-% IV SOLN
2100.0000 [IU]/h | INTRAVENOUS | Status: DC
  Administered 2024-02-05: 2350 [IU]/h via INTRAVENOUS
  Administered 2024-02-06: 2100 [IU]/h via INTRAVENOUS
  Administered 2024-02-07: 1950 [IU]/h via INTRAVENOUS
  Administered 2024-02-08: 2100 [IU]/h via INTRAVENOUS
  Administered 2024-02-08: 1950 [IU]/h via INTRAVENOUS
  Administered 2024-02-09 – 2024-02-11 (×5): 2100 [IU]/h via INTRAVENOUS
  Filled 2024-02-05 (×12): qty 250

## 2024-02-05 NOTE — Progress Notes (Signed)
 PT Cancellation Note  Patient Details Name: AGUSTUS MANE MRN: [id number] DOB: 03-May-1950   Cancelled Treatment:    Reason Eval/Treat Not Completed: Medical issues which prohibited therapy (Pt in EGD currently.  Will return as able.)   [name] 02/05/2024, 8:58 AM Jourden Delmont M,PT Acute Rehab Services [phone]

## 2024-02-05 NOTE — Op Note (Signed)
 [facility] Patient Name: Raymond Hurst Procedure Date : 02/05/2024 MRN: [id number] Attending MD: Glendia BRAVO. Stacia , MD, [id number] Date of Birth: 1950-02-03 CSN: [id number] Age: 74 Admit Type: Inpatient Procedure:                Upper GI endoscopy Indications:              Suspected upper gastrointestinal bleeding Providers:                Glendia E. Stacia, MD, Ozell Pouch, Curtistine Bishop, Technician Referring MD:              Medicines:                Monitored Anesthesia Care Complications:            No immediate complications. Estimated Blood Loss:     Estimated blood loss: none. Procedure:                Pre-Anesthesia Assessment:                           - Prior to the procedure, a History and Physical                            was performed, and patient medications and                            allergies were reviewed. The patient's tolerance of                            previous anesthesia was also reviewed. The risks                            and benefits of the procedure and the sedation                            options and risks were discussed with the patient.                            All questions were answered, and informed consent                            was obtained. Prior Anticoagulants: The patient has                            taken Eliquis  (apixaban ), last dose was 2 days                            prior to procedure. ASA Grade Assessment: III - A                            patient with severe systemic disease. After  reviewing the risks and benefits, the patient was                            deemed in satisfactory condition to undergo the                            procedure.                           After obtaining informed consent, the endoscope was                            passed under direct vision. Throughout the                            procedure, the patient's  blood pressure, pulse, and                            oxygen saturations were monitored continuously. The                            [id number]-H190 ([id number]) Olympus endoscope was introduced                            through the mouth, and advanced to the third part                            of duodenum. The upper GI endoscopy was                            accomplished without difficulty. The patient                            tolerated the procedure well. Scope In: Scope Out: Findings:      The examined esophagus was normal.      Scattered mild inflammation characterized by erythema was found in the       gastric body.      The exam of the stomach was otherwise normal.      Suture material was found in the duodenal bulb.      Diffuse moderately friable and polypoid reactive mucosa without active       bleeding was found in the duodenal bulb.      The exam of the duodenum was otherwise normal. Impression:               - Normal esophagus.                           - Gastritis.                           - Post surgical findings of suture material with                            surrounding erythematous friable, polypoid mucoa  were seen in the duodenal bulb. No ulcer was seen.                            No active bleeding or high risk stigmata was noted.                            No contact bleeding was appreciated.                           - Recurrent bleeding may have been from friable                            duodenal mucosa, although this is not certain.                            Reassuring of stable hemoglobin, BUN and negative                            CT-A.                           - No specimens collected. Moderate Sedation:      N/A Recommendation:           - Return patient to hospital ward for ongoing care.                           - Ok to resume previous diet.                           - Continue Protonix  (pantoprazole ) 40 mg IV BID.                            - Use sucralfate  tablets 1 gram PO QID for 2 weeks                            to help protect/heal friable duodenal bulb mucosa.                           - Would resume heparin  drip for now and observe for                            further evidence of GI bleeding.                           - Would hold off on oral DOAC for now. Procedure Code(s):        --- Professional ---                           [phone], Esophagogastroduodenoscopy, flexible,                            transoral; diagnostic, including collection of  specimen(s) by brushing or washing, when performed                            (separate procedure) Diagnosis Code(s):        --- Professional ---                           K29.70, Gastritis, unspecified, without bleeding                           T18.3XXA, Foreign body in small intestine, initial                            encounter                           K31.89, Other diseases of stomach and duodenum CPT copyright 2022 American [employer] Association. All rights reserved. The codes documented in this report are preliminary and upon coder review may  be revised to meet current compliance requirements. Jocee Kissick E. Stacia, MD 02/05/2024 8:49:17 AM This report has been signed electronically. Number of Addenda: 0

## 2024-02-05 NOTE — TOC Progression Note (Signed)
 Transition of Care [facility]) - Progression Note    Patient Details  Name: Raymond Hurst MRN: [id number] Date of Birth: [birth year]  [facility]) CM/SW Contact  Lendia Dais, [place] Phone Number: 02/05/2024, 3:11 PM  Clinical Narrative:  CSW spoke to Kristin of [facility] who stated they have accepted the pt and can take once medically stable.  Pt is not yet medically stable and is experiencing internal bleeding. CSW will continue to monitor for medical progression to start auth.      Barriers to Discharge: Continued Medical Work up               Expected Discharge Plan and Services                                               Social Drivers of Health (SDOH) Interventions SDOH Screenings   Food Insecurity: No Food Insecurity (01/16/2024)  Housing: Low Risk (01/26/2024)  Transportation Needs: No Transportation Needs (01/16/2024)  Utilities: Not At Risk (01/16/2024)  Alcohol Screen: Low Risk (06/15/2023)  Depression (PHQ2-9): Low Risk (01/12/2024)  Financial Resource Strain: High Risk (06/15/2023)  Physical Activity: Inactive (06/15/2023)  Social Connections: Moderately Isolated (01/16/2024)  Stress: No Stress Concern Present (06/15/2023)  Tobacco Use: Medium Risk (01/19/2024)    Readmission Risk Interventions    09/08/2023   12:09 PM  Readmission Risk Prevention Plan  Transportation Screening Complete  HRI or Home Care Consult Complete  Social Work Consult for Recovery Care Planning/Counseling Complete  Palliative Care Screening Not Applicable  Medication Review Oceanographer) Complete

## 2024-02-05 NOTE — Progress Notes (Signed)
 Physical Therapy Treatment Patient Details Name: Raymond Hurst MRN: [id number] DOB: Jul 02, 1950 Today's Date: 02/05/2024   History of Present Illness Pt is 74 yo initially presenting to [facility] then transferring to [facility] on 12/16 due to dizziness hematemesis. 12/17 pt with continuing ongoing blood loss anemia without recurrent hematemesis and was given 2 U PRBC. EGD with findings of duodenal active bleed which was clipped. Pt was intubated and admitted to ICU on 12/17 then transferred to [facility]. 12/19 ex lap abdominal wall closure with some bleeding from liver capsule controlled with cautery.  PMH includes afib, gout, GI bleed, stage IV lung cancer, alcohol abuse.    PT Comments  Pt admitted with above diagnosis. Pt was able to ambulate with RW with CGA and incr distance today.  Needs min assist to stand from lower surfaces. Pt progressing well overall.  Pt currently with functional limitations due to the deficits listed below (see PT Problem List). Pt will benefit from acute skilled PT to increase their independence and safety with mobility to allow discharge.       If plan is discharge home, recommend the following: Assist for transportation;Assistance with cooking/housework   Can travel by private vehicle        Equipment Recommendations  BSC/3in1;Hospital bed    Recommendations for Other Services       Precautions / Restrictions Precautions Precautions: Fall Recall of Precautions/Restrictions: Intact Restrictions Weight Bearing Restrictions Per Provider Order: No     Mobility  Bed Mobility Overal bed mobility: Needs Assistance Bed Mobility: Supine to Sit     Supine to sit: Contact guard, HOB elevated     General bed mobility comments: CGA with use of bed features    Transfers Overall transfer level: Needs assistance Equipment used: Rolling walker (2 wheels) Transfers: Sit to/from Stand Sit to Stand: Contact guard assist           General transfer comment: min to rise from  EOB (not elevated ) with use of RW, cues for hand placement    Ambulation/Gait Ambulation/Gait assistance: Contact guard assist Gait Distance (Feet): 120 Feet Assistive device: Rolling walker (2 wheels) Gait Pattern/deviations: Step-through pattern, Decreased stance time - left, Drifts right/left, Trunk flexed, Wide base of support, Decreased weight shift to left Gait velocity: decreased Gait velocity interpretation: <1.31 ft/sec, indicative of household ambulator   General Gait Details: Pt with improvement in gait today, decreased step length, mild drift to the R with mild trunk flexion; can correct briefly with cues; increased BOS with decreased stance time on the L with cues to sequence steps and RW   Stairs             Wheelchair Mobility     Tilt Bed    Modified Rankin (Stroke Patients Only)       Balance Overall balance assessment: Needs assistance Sitting-balance support: No upper extremity supported, Feet supported Sitting balance-Leahy Scale: Fair     Standing balance support: Bilateral upper extremity supported, During functional activity, Reliant on assistive device for balance Standing balance-Leahy Scale: Poor Standing balance comment: reliant on BUE support                            Communication Communication Communication: Impaired Factors Affecting Communication: Hearing impaired  Cognition Arousal: Alert Behavior During Therapy: Flat affect   PT - Cognitive impairments: No family/caregiver present to determine baseline, Safety/Judgement, Sequencing  Following commands: Impaired Following commands impaired: Follows multi-step commands with increased time    Cueing Cueing Techniques: Verbal cues, Tactile cues, Visual cues, Gestural cues  Exercises General Exercises - Lower Extremity Ankle Circles/Pumps: AROM, Both, 10 reps, Seated Long Arc Quad: AROM, Both, 10 reps, Seated Straight Leg Raises:  AROM, Both, 5 reps, Supine Hip Flexion/Marching: AROM, Both, 10 reps, Seated    General Comments General comments (skin integrity, edema, etc.): VSS      Pertinent Vitals/Pain Pain Assessment Pain Assessment: No/denies pain    Home Living                          Prior Function            PT Goals (current goals can now be found in the care plan section) Acute Rehab PT Goals Patient Stated Goal: to improve mobiltiy Progress towards PT goals: Progressing toward goals    Frequency    Min 2X/week      PT Plan      Co-evaluation              AM-PAC PT 6 Clicks Mobility   Outcome Measure  Help needed turning from your back to your side while in a flat bed without using bedrails?: A Little Help needed moving from lying on your back to sitting on the side of a flat bed without using bedrails?: A Little Help needed moving to and from a bed to a chair (including a wheelchair)?: A Little Help needed standing up from a chair using your arms (e.g., wheelchair or bedside chair)?: A Little Help needed to walk in hospital room?: A Little Help needed climbing 3-5 steps with a railing? : Total 6 Click Score: 16    End of Session Equipment Utilized During Treatment: Gait belt Activity Tolerance: Patient tolerated treatment well Patient left: with call bell/phone within reach;in chair;with chair alarm set Nurse Communication: Mobility status PT Visit Diagnosis: Unsteadiness on feet (R26.81);Other abnormalities of gait and mobility (R26.89);Muscle weakness (generalized) (M62.81)     Time: 8546-8480 PT Time Calculation (min) (ACUTE ONLY): 26 min  Charges:    $Gait Training: 8-22 mins $Therapeutic Exercise: 8-22 mins PT General Charges $$ ACUTE PT VISIT: 1 Visit                     Audi Conover M,PT Acute Rehab Services [phone]    [name] 02/05/2024, 3:34 PM

## 2024-02-05 NOTE — Progress Notes (Signed)
 PHARMACY - ANTICOAGULATION CONSULT NOTE  Pharmacy Consult for heparin   Indication: atrial fibrillation and chronic RUE DVT  Allergies[1]  Patient Measurements: Height: 6' 0.01 (182.9 cm) Weight: (!) 136.7 kg (301 lb 5.9 oz) IBW/kg (Calculated) : 77.62 HEPARIN  DW (KG): 103.8  Vital Signs: Temp: 97.7 F (36.5 C) (01/05 2009) Temp Source: Oral (01/05 0905) BP: 109/67 (01/05 2009) Pulse Rate: 89 (01/05 2009)  Labs: Recent Labs    02/03/24 1129 02/03/24 1225 02/04/24 0527 02/04/24 1616 02/05/24 0653 02/05/24 1414 02/05/24 1956  HGB 16.5  --  15.3   < > 15.3 14.2 14.4  HCT 49.8  --  46.5   < > 46.2 43.2 42.8  PLT 211  --  197  --   --   --   --   HEPARINUNFRC  --   --   --   --   --   --  0.68  CREATININE  --  1.43* 1.46*  --  1.37*  --   --    < > = values in this interval not displayed.    Estimated Creatinine Clearance: 70.8 mL/min (A) (by C-G formula based on SCr of 1.37 mg/dL (H)).   Assessment: 70 YOM w/ Hx AF with an RUE DVT in need of AC. Apixaban  PTA. Last dose was >7 day ago. He was admitted to [facility] w/  duodenal ulcer hemorrhage which was clipped on 12/17.   Resuming heparin  s/p EGD this AM, to monitor on heparin  for a few days before/if resuming apixaban   HL came back therapeutic this AM. Cont current rate and check confirm in AM.   Goal of Therapy:  Heparin  level 0.3-0.5 units/mL Monitor platelets by anticoagulation protocol: Yes   Plan:  Cont heparin  at 2350 units / hr Daily heparin  level, CBC  Sergio Batch, PharmD, BCIDP, AAHIVP, CPP Infectious Disease Pharmacist 02/05/2024 8:33 PM           [1] No Known Allergies

## 2024-02-05 NOTE — TOC Progression Note (Addendum)
 Transition of Care [facility]) - Progression Note    Patient Details  Name: Raymond Hurst MRN: [id number] Date of Birth: 08-30-1950  Transition of Care [facility]) CM/SW Contact  Lendia Dais, [place] Phone Number: 02/05/2024, 8:26 AM  Clinical Narrative: CSW attempted to reach out to Kristin of [facility]. Automated voicemail stated that the call was unable to be complete at this time.  CSW called the main number and left a VM for [place]. CSW left a VM for pt's spouse [name] of an update.  CSW will continue to monitor.         Barriers to Discharge: Continued Medical Work up               Expected Discharge Plan and Services                                               Social Drivers of Health (SDOH) Interventions SDOH Screenings   Food Insecurity: No Food Insecurity (01/16/2024)  Housing: Low Risk (01/26/2024)  Transportation Needs: No Transportation Needs (01/16/2024)  Utilities: Not At Risk (01/16/2024)  Alcohol Screen: Low Risk (06/15/2023)  Depression (PHQ2-9): Low Risk (01/12/2024)  Financial Resource Strain: High Risk (06/15/2023)  Physical Activity: Inactive (06/15/2023)  Social Connections: Moderately Isolated (01/16/2024)  Stress: No Stress Concern Present (06/15/2023)  Tobacco Use: Medium Risk (01/19/2024)    Readmission Risk Interventions    09/08/2023   12:09 PM  Readmission Risk Prevention Plan  Transportation Screening Complete  HRI or Home Care Consult Complete  Social Work Consult for Recovery Care Planning/Counseling Complete  Palliative Care Screening Not Applicable  Medication Review Oceanographer) Complete

## 2024-02-05 NOTE — Plan of Care (Signed)
" °  Problem: Health Behavior/Discharge Planning: Goal: Ability to manage health-related needs will improve Outcome: Progressing   Problem: Clinical Measurements: Goal: Ability to maintain clinical measurements within normal limits will improve Outcome: Progressing Goal: Will remain free from infection Outcome: Progressing Goal: Diagnostic test results will improve Outcome: Progressing Goal: Respiratory complications will improve Outcome: Progressing Goal: Cardiovascular complication will be avoided Outcome: Progressing   Problem: Activity: Goal: Risk for activity intolerance will decrease Outcome: Progressing   Problem: Nutrition: Goal: Adequate nutrition will be maintained Outcome: Progressing   Problem: Coping: Goal: Level of anxiety will decrease Outcome: Progressing   Problem: Elimination: Goal: Will not experience complications related to bowel motility Outcome: Progressing Goal: Will not experience complications related to urinary retention Outcome: Progressing   Problem: Pain Managment: Goal: General experience of comfort will improve and/or be controlled Outcome: Progressing   Problem: Safety: Goal: Ability to remain free from injury will improve Outcome: Progressing   Problem: Skin Integrity: Goal: Risk for impaired skin integrity will decrease Outcome: Progressing   Problem: Education: Goal: Ability to describe self-care measures that may prevent or decrease complications (Diabetes Survival Skills Education) will improve Outcome: Progressing Goal: Individualized Educational Video(s) Outcome: Progressing   Problem: Coping: Goal: Ability to adjust to condition or change in health will improve Outcome: Progressing   Problem: Fluid Volume: Goal: Ability to maintain a balanced intake and output will improve Outcome: Progressing   Problem: Health Behavior/Discharge Planning: Goal: Ability to identify and utilize available resources and services will  improve Outcome: Progressing Goal: Ability to manage health-related needs will improve Outcome: Progressing   Problem: Metabolic: Goal: Ability to maintain appropriate glucose levels will improve Outcome: Progressing   Problem: Nutritional: Goal: Maintenance of adequate nutrition will improve Outcome: Progressing Goal: Progress toward achieving an optimal weight will improve Outcome: Progressing   Problem: Skin Integrity: Goal: Risk for impaired skin integrity will decrease Outcome: Progressing   Problem: Tissue Perfusion: Goal: Adequacy of tissue perfusion will improve Outcome: Progressing   Problem: Education: Goal: Knowledge of the prescribed therapeutic regimen will improve Outcome: Progressing   Problem: Bowel/Gastric: Goal: Gastrointestinal status for postoperative course will improve Outcome: Progressing   Problem: Cardiac: Goal: Ability to maintain an adequate cardiac output Outcome: Progressing Goal: Will show no evidence of cardiac arrhythmias Outcome: Progressing   Problem: Nutritional: Goal: Will attain and maintain optimal nutritional status Outcome: Progressing   Problem: Neurological: Goal: Will regain or maintain usual level of consciousness Outcome: Progressing   Problem: Clinical Measurements: Goal: Ability to maintain clinical measurements within normal limits Outcome: Progressing Goal: Postoperative complications will be avoided or minimized Outcome: Progressing   "

## 2024-02-05 NOTE — Progress Notes (Signed)
 Nutrition Follow-up  DOCUMENTATION CODES:  Non-severe (moderate) malnutrition in context of chronic illness  INTERVENTION:  Continue soft diet with PO supplements: Ensure Plus High Protein po BID, each supplement provides 350 kcal and 20 grams of protein Magic cup TID with meals, each supplement provides 290 kcal and 9 grams of protein Mighty Shake TID with meals, each supplement provides 330 kcals and 9 grams of protein  NUTRITION DIAGNOSIS:  Moderate Malnutrition related to chronic illness (COPD, CVA, GIB, NSCLC) as evidenced by moderate fat depletion, severe muscle depletion, edema; ongoing.  GOAL:  Patient will meet greater than or equal to 90% of their needs; progressing.   MONITOR:  Vent status, Labs, Weight trends, I & O's, Skin  REASON FOR ASSESSMENT:  Ventilator   ASSESSMENT:  Pt admitted with hematemesis r/t duodenal hemorrhage. PMH significant for COPD, afib on Eliquis , CVA, history of GI bleed d/t gastric ulcer, chronic lymphedema, non small cell lung cancer on monthly chemo. Recently admitted 12/6-12/8 with bloody stools r/t clean-based gastric ulcer without signs of active bleeding.  S/P EGD this morning; results: no bleeding, + gastritis Soft diet has been resumed. He is being offered Ensure supplements BID between meals.  Meal intakes documented at 25-50% 1/1-1/3.   Patient working with Physical Therapy during RD visit, so did not disturb. He is drinking the Ensure supplements once or twice daily. Also receiving magic cups and mighty shakes with meals TID. Intake of meals is suboptimal, but supplements are increasing total calorie and protein intake.   Labs reviewed.  Medications reviewed and include MVI with minerals, protonix , sucralfate .   Admit weight 119.7 kg (12/16) Most recent weight: 136.7 kg (12/29)  Diet Order:   Diet Order             DIET SOFT Room service appropriate? Yes; Fluid consistency: Thin  Diet effective now                    EDUCATION NEEDS:   No education needs have been identified at this time  Skin:  Skin Assessment: Reviewed RN Assessment  Last BM:  1/4  Height:   Ht Readings from Last 1 Encounters:  01/21/24 6' 0.01 (1.829 m)    Weight:   Wt Readings from Last 1 Encounters:  01/29/24 (!) 136.7 kg    Ideal Body Weight:  80.9 kg  BMI:  Body mass index is 40.86 kg/m.  Estimated Nutritional Needs:   Kcal:  2100-2300  Protein:  115-130g  Fluid:  >/=2L   Suzen HUNT RD, LDN, CNSC Contact via secure chat. If unavailable, use group chat RD Inpatient.

## 2024-02-05 NOTE — Anesthesia Preprocedure Evaluation (Addendum)
 "                                  Anesthesia Evaluation  Patient identified by MRN, date of birth, ID band Patient awake    Reviewed: Allergy & Precautions, NPO status , Patient's Chart, lab work & pertinent test results  Airway Mallampati: II  TM Distance: >3 FB     Dental  (+) Missing, Dental Advisory Given, Poor Dentition, Chipped   Pulmonary sleep apnea and Continuous Positive Airway Pressure Ventilation , COPD,  COPD inhaler, former smoker   breath sounds clear to auscultation + decreased breath sounds      Cardiovascular + DOE  Normal cardiovascular exam+ dysrhythmias Atrial Fibrillation  Rhythm:Regular Rate:Normal  EKG 01/18/24 Atrial fibrillation with slow ventricular response  Left axis deviation Low voltage QRS Cannot rule out Anteroseptal infarct , age undetermined ST & T wave abnormality, consider lateral ischemia  Echo 05/06/19 1. Left ventricular ejection fraction, by estimation, is 55 to 60%. The  left ventricle has normal function. The left ventricle has no regional  wall motion abnormalities. There is mild left ventricular hypertrophy.  Left ventricular diastolic parameters  are indeterminate.   2. Right ventricular systolic function is normal. The right ventricular  size is normal.   3. The mitral valve is normal in structure. Trivial mitral valve  regurgitation. No evidence of mitral stenosis.   4. The aortic valve has an indeterminant number of cusps. Aortic valve  regurgitation is not visualized. No aortic stenosis is present.   5. Aortic dilatation noted. There is mild to moderate dilatation of the  aortic root measuring 43 mm.   6. Indeterminant PASP, inadequate TR jet.   7. The inferior vena cava is normal in size with greater than 50%  respiratory variability, suggesting right atrial pressure of 3 mmHg.     Neuro/Psych  PSYCHIATRIC DISORDERS      TIACVA    GI/Hepatic PUD,,,(+)     substance abuse  alcohol useLab Results       Component                Value               Date                      ALT                      58 (H)              01/29/2024                AST                      50 (H)              01/29/2024                ALKPHOS                  92                  01/29/2024                BILITOT                  0.9  01/29/2024            Melena S/P Exploratory laparotomy, duodenotomy and ligation of GDA and branches for duodenal ulcer bleed on 01/17/2024   Endo/Other    Class 3 obesity  Renal/GU Renal InsufficiencyRenal diseaseLab Results      Component                Value               Date                      NA                       138                 02/05/2024                CL                       102                 02/05/2024                K                        4.4                 02/05/2024                CO2                      26                  02/05/2024                BUN                      33 (H)              02/05/2024                CREATININE               1.37 (H)            02/05/2024                GFRNONAA                 55 (L)              02/05/2024                CALCIUM                   9.2                 02/05/2024                PHOS                     4.2                 02/01/2024                ALBUMIN                  3.0 (L)  01/29/2024                GLUCOSE                  104 (H)             02/05/2024             negative genitourinary   Musculoskeletal negative musculoskeletal ROS (+)    Abdominal  (+) + obese  Peds  Hematology  (+) Blood dyscrasia, anemia Lab Results      Component                Value               Date                      WBC                      8.6                 02/04/2024                HGB                      15.3                02/05/2024                HCT                      46.2                02/05/2024                MCV                      90.3                 02/04/2024                PLT                      197                 02/04/2024             Eliquis  therapy- last dose 1/3   Anesthesia Other Findings   Reproductive/Obstetrics                              Anesthesia Physical Anesthesia Plan  ASA: 3  Anesthesia Plan: MAC   Post-op Pain Management: Minimal or no pain anticipated   Induction: Intravenous  PONV Risk Score and Plan: 2 and Treatment may vary due to age or medical condition and Propofol  infusion  Airway Management Planned: Natural Airway and Simple Face Mask  Additional Equipment: None  Intra-op Plan:   Post-operative Plan:   Informed Consent: I have reviewed the patients History and Physical, chart, labs and discussed the procedure including the risks, benefits and alternatives for the proposed anesthesia with the patient or authorized representative who has indicated his/her understanding and acceptance.     Dental advisory given  Plan Discussed with: CRNA and Anesthesiologist  Anesthesia Plan Comments:          Anesthesia Quick Evaluation  "

## 2024-02-05 NOTE — Progress Notes (Signed)
 " PROGRESS NOTE    Raymond Hurst  FMW:[id number] DOB: 1950/05/13 DOA: 01/16/2024 PCP: [name], MD    Brief Narrative:  The patient is a 74 year old male with PMHx of metastatic lung adenocarcinoma (LUL, PD-L1 20%) s/p carbo/taxol  + XRT (06-09/2023) on durvalumab , AFib on Eliquis , COPD, gout, OSA on CPAP, chronic lymphedema, previous alcohol abuse, UGIB 2/2 cratered duodenal ulcer s/p GDA embolization (09/09/2023), recurrent UGIB 2/2 gastric ulcer, who presented to [facility] on 01/16/2024 complaining of dizziness, nausea, and hematemesis, admitted for ABLA and hypotension secondary to acute UGIB s/p transfusion of 3 units pRBC, underwent EGD (12/17) which showed duodenal bulb bleeding possible from coil erosion s/p Hemospray. The patient was kept intubated and transferred to [facility] for IR eval. While in the ICU on 12/17, he went into hemorrhagic shock and required pressors and MTP, Kcentra , TXA, and factor 7. Underwent emergent ex-lap with duodenotomy and ligation of GDA with wound vac placement. Hgb and BP stabilized and he was off pressors. Underwent secondary closure of abdomen on 12/19 with JP drain placement. Extubated on 12/23. Transferred out of ICU on 12/26.   Assessment and Plan:  # Duodenal ulcer hemorrhage with coiling 09/2023 # s/p laparotomy 12/17, secondary closure 12/19 as above - Vomiting resolved since 12/26 - Off TPN on 12/31 - JP drain removed 12/31 - Tolerating mechanical soft diet - Back on IV PPI BID  # Recurrent GI bleed - Had multiple episodes of bloody and melanotic stools on 1/3 and 1/4 (images in media) - Continue holding Eliquis  - Vital signs remain stable - Hgb remains stable - General surgery reengaded, following - CTA GIB negative for active bleeding - GI consulted, planning for EGD today - q6h H&H - IV protonix  BID   # Sepsis secondary to above  # Staph epi bacteremia-MRSE with resistances # Likely peritonitis - Bcx 12/21 postive for staph epi - Bcx  12/26 no growth - Formally consulted ID on 12/30  - Pt has completed course of antibiotics as of 12/30  - RIJ CVC removed 1/2  # AKI on admission--- resolving  - Baseline Cr 0.7 - 0.9. Most recently has been consistent 1.31-1.34 - Cr 1.37  # Atrial fibrillation  - CHADVASC >4 - Patient's family discussed with cardiology on 1/2, agreed to continue Eliquis . Please see cardiology consult note from 02/02/2024.  - Eliquis  was again stopped on 1/3 given episode of bloody stools/melena  # Right cephalic vein acute SVT # Right brachial vein acute DVT - Noted on RUE doppler on 12/29 - Eliquis  held as above  # Hypernatremia - Thought to be secondary to free water  deficit - Resolved  # Hypokalemia - Resolved  # Urinary retention - Foley was initially removed and patient was voiding well. However, noted to be retaining again today.  - Indwelling cath placed and will be kept at discharge. Will need outpt f/u with urology   # Left lung mass bronchogenic CA - Holding immunotherapy/chemotherapy - Outpatient follow-up   # Critical illness thrombocytopenia - resolved    Scheduled Meds:  budesonide -glycopyrrolate -formoterol   2 puff Inhalation BID   Chlorhexidine  Gluconate Cloth  6 each Topical Daily   feeding supplement  237 mL Oral BID BM   Gerhardt's butt cream   Topical BID   multivitamin with minerals  1 tablet Oral Daily   mouth rinse  15 mL Mouth Rinse TID   pantoprazole  (PROTONIX ) IV  40 mg Intravenous Q12H   Continuous Infusions:    PRN Meds:.acetaminophen , hydrALAZINE , HYDROcodone -acetaminophen ,  hydrOXYzine , ipratropium-albuterol , labetalol , ondansetron  (ZOFRAN ) IV, sodium chloride  flush  Current Outpatient Medications  Medication Instructions   acetaminophen  (TYLENOL ) 500 mg, Every 6 hours PRN   allopurinol  (ZYLOPRIM ) 100 mg, Daily   apixaban  (ELIQUIS ) 5 mg, Oral, 2 times daily, Okay to restart this medication on 08/29/2023   budesonide -glycopyrrolate -formoterol  (BREZTRI   AEROSPHERE) 160-9-4.8 MCG/ACT AERO inhaler 2 puffs, Inhalation, 2 times daily   cholecalciferol  (VITAMIN D3) 1,000 Units, Daily   cyanocobalamin  (VITAMIN B12) 100 mcg, Daily   folic acid  (FOLVITE ) 1 mg, Oral, Daily   furosemide  (LASIX ) 10 mg, Oral, Daily   ipratropium-albuterol  (DUONEB) 0.5-2.5 (3) MG/3ML SOLN 3 mLs, Nebulization, Every 6 hours PRN   loperamide  (IMODIUM  A-D) 2-4 mg, 4 times daily PRN   metoprolol  tartrate (LOPRESSOR ) 25 mg, Oral, 2 times daily   pantoprazole  (PROTONIX ) 40 MG tablet TAKE 1 TABLET (40 MG TOTAL) BY MOUTH TWICE A DAY BEFORE MEALS   thiamine  (VITAMIN B-1) 100 mg, Daily    DVT prophylaxis: SCDs Start: 01/16/24 1731   Code Status:   Code Status: Full Code  Family Communication: GI service updated patient's wife  Disposition Plan: Has been weaned off TPN and is medically ready for discharge to SNF. Pending bed.  PT - Follow Up Recommendations: Skilled nursing-short term rehab (<3 hours/day) - PT equipment: BSC/3in1, Hospital bed OT - Follow Up Recommendations: Skilled nursing-short term rehab (<3 hours/day) -    Level of care: Telemetry  Consultants:  General surgery, ID, GI, PCCM    Subjective: NAEO.  Remained hemodynamically stable. No new episodes of bloody bowel movements overnight or this morning. Going for EGD today.   Objective: Vitals:   02/04/24 2024 02/04/24 2218 02/05/24 0221 02/05/24 0340  BP: 128/84 124/74 124/85 123/71  Pulse: 82 80 93 91  Resp: 19 20 19 20   Temp: 97.7 F (36.5 C) (!) 97.5 F (36.4 C) 97.6 F (36.4 C) (!) 97.4 F (36.3 C)  TempSrc: Oral Oral  Oral  SpO2: 98% 95% 99% 98%  Weight:      Height:        Intake/Output Summary (Last 24 hours) at 02/05/2024 0636 Last data filed at 02/05/2024 0340 Gross per 24 hour  Intake 1070.97 ml  Output 4020 ml  Net -2949.03 ml   Filed Weights   01/22/24 0232 01/25/24 0452 01/29/24 0406  Weight: 135.7 kg 127.2 kg (!) 136.7 kg    Examination:  Gen: NAD, A&Ox3 HEENT:  NCAT Neck: Supple CV: RRR, no murmurs Resp: normal WOB, CTAB anteriorly Abd: Soft, NTND, no guarding, midline incision dressing c/d/i Ext: BLE swelling and chronic venous stasis changes Skin: Warm, dry Neuro: No focal deficits Psych: Calm, cooperative, appropriate affect    Data Reviewed: I have personally reviewed following labs and imaging studies  CBC: Recent Labs  Lab 01/30/24 0323 02/01/24 0047 02/02/24 0326 02/03/24 1129 02/04/24 0527 02/04/24 1616 02/04/24 1938  WBC 8.7 7.6 7.4 7.7 8.6  --   --   HGB 15.1 15.2 15.7 16.5 15.3 15.0 15.5  HCT 46.1 47.5 47.2 49.8 46.5 45.1 48.3  MCV 90.7 91.5 89.7 90.5 90.3  --   --   PLT 152 179 193 211 197  --   --    Basic Metabolic Panel: Recent Labs  Lab 01/31/24 0312 02/01/24 0047 02/02/24 0326 02/03/24 1225 02/04/24 0527  NA 139 139 137 135 137  K 4.3 4.3 4.0 4.5 4.2  CL 104 104 103 102 102  CO2 24 24 22  21* 23  GLUCOSE 106*  92 120* 132* 99  BUN 39* 36* 33* 36* 33*  CREATININE 1.31* 1.32* 1.32* 1.43* 1.46*  CALCIUM  9.1 9.0 8.9 9.1 9.0  MG 2.4 2.3  --   --   --   PHOS 4.3 4.2  --   --   --    GFR: Estimated Creatinine Clearance: 66.4 mL/min (A) (by C-G formula based on SCr of 1.46 mg/dL (H)). Liver Function Tests: No results for input(s): AST, ALT, ALKPHOS, BILITOT, PROT, ALBUMIN in the last 168 hours.  No results for input(s): LIPASE, AMYLASE in the last 168 hours. No results for input(s): AMMONIA in the last 168 hours. Coagulation Profile: No results for input(s): INR, PROTIME in the last 168 hours. Cardiac Enzymes: No results for input(s): CKTOTAL, CKMB, CKMBINDEX, TROPONINI in the last 168 hours. BNP (last 3 results) No results for input(s): PROBNP in the last 8760 hours. HbA1C: No results for input(s): HGBA1C in the last 72 hours. CBG: Recent Labs  Lab 02/01/24 1953  GLUCAP 110*   Lipid Profile: Recent Labs    02/03/24 0645  CHOL 152  HDL 27*  LDLCALC 98  TRIG  136  CHOLHDL 5.7    Thyroid  Function Tests: No results for input(s): TSH, T4TOTAL, FREET4, T3FREE, THYROIDAB in the last 72 hours. Anemia Panel: No results for input(s): VITAMINB12, FOLATE, FERRITIN, TIBC, IRON , RETICCTPCT in the last 72 hours. Sepsis Labs: No results for input(s): PROCALCITON, LATICACIDVEN in the last 168 hours.  Recent Results (from the past 240 hours)  Culture, blood (Routine X 2) w Reflex to ID Panel     Status: None   Collection Time: 01/26/24  2:24 PM   Specimen: BLOOD RIGHT HAND  Result Value Ref Range Status   Specimen Description BLOOD RIGHT HAND  Final   Special Requests   Final    BOTTLES DRAWN AEROBIC ONLY Blood Culture results may not be optimal due to an inadequate volume of blood received in culture bottles   Culture   Final    NO GROWTH 5 DAYS Performed at [facility], [address], [place], [place] [address]    Report Status 01/31/2024 FINAL  Final  Culture, blood (Routine X 2) w Reflex to ID Panel     Status: None   Collection Time: 01/26/24  2:27 PM   Specimen: BLOOD LEFT HAND  Result Value Ref Range Status   Specimen Description BLOOD LEFT HAND  Final   Special Requests   Final    BOTTLES DRAWN AEROBIC ONLY Blood Culture results may not be optimal due to an inadequate volume of blood received in culture bottles   Culture   Final    NO GROWTH 5 DAYS Performed at [facility], [address] [place], [place] [address]    Report Status 01/31/2024 FINAL  Final     Radiology Studies: CT ANGIO GI BLEED Result Date: 02/03/2024 CLINICAL DATA:  Lower gastrointestinal bleeding EXAM: CTA ABDOMEN AND PELVIS WITHOUT AND WITH CONTRAST TECHNIQUE: Multidetector CT imaging of the abdomen and pelvis was performed using the standard protocol during bolus administration of intravenous contrast. Multiplanar reconstructed images and MIPs were obtained and reviewed to evaluate the vascular anatomy. RADIATION DOSE  REDUCTION: This exam was performed according to the departmental dose-optimization program which includes automated exposure control, adjustment of the mA and/or kV according to patient size and/or use of iterative reconstruction technique. CONTRAST:  75mL OMNIPAQUE  IOHEXOL  350 MG/ML SOLN COMPARISON:  January 06, 2024 FINDINGS: VASCULAR Aorta: Aortic atherosclerosis  without aneurysm or dissection. Celiac: Patent without evidence of aneurysm, dissection, vasculitis or significant stenosis. SMA: Patent without evidence of aneurysm, dissection, vasculitis or significant stenosis. Renals: Both renal arteries are patent without evidence of aneurysm, dissection, vasculitis, fibromuscular dysplasia or significant stenosis. IMA: Patent without evidence of aneurysm, dissection, vasculitis or significant stenosis. Inflow: Patent without evidence of aneurysm, dissection, vasculitis or significant stenosis. Proximal Outflow: Bilateral common femoral and visualized portions of the superficial and profunda femoral arteries are patent without evidence of aneurysm, dissection, vasculitis or significant stenosis. Veins: No obvious venous abnormality within the limitations of this arterial phase study. Review of the MIP images confirms the above findings. NON-VASCULAR Lower chest: No acute abnormality. Hepatobiliary: No focal liver abnormality is seen. No gallstones, gallbladder wall thickening, or biliary dilatation. Pancreas: Unremarkable. No pancreatic ductal dilatation or surrounding inflammatory changes. Spleen: Normal in size without focal abnormality. Adrenals/Urinary Tract: Stable right adrenal myelolipoma. Left adrenal gland is unremarkable. Stable bilateral renal cysts. No hydronephrosis or renal obstruction. Urinary bladder is decompressed secondary to Foley catheter. Large left-sided diverticulum is again noted. Stomach/Bowel: The stomach is unremarkable. The appendix appears normal. There is no evidence of bowel  obstruction. Diverticulosis is noted throughout the colon. Minimal inflammatory changes are seen adjacent to proximal sigmoid colon suggesting possible mild diverticulitis. Lymphatic: No adenopathy is noted. Reproductive: Prostatic calcifications are again noted and unchanged. Other: No ascites or hernia is noted. Musculoskeletal: Old L1 compression fracture is noted. IMPRESSION: 1. No evidence of active gastrointestinal bleeding. 2. Diverticulosis is noted throughout the colon. Minimal inflammatory changes are seen adjacent to proximal sigmoid colon suggesting possible mild diverticulitis. 3. Stable right adrenal myelolipoma. 4. Stable bilateral renal cysts. 5. Urinary bladder is decompressed secondary to Foley catheter. Large left-sided diverticulum is again noted. 6. Aortic atherosclerosis. Aortic Atherosclerosis (ICD10-I70.0). Electronically Signed   By: Lynwood Landy Raddle M.D.   On: 02/03/2024 15:40    Scheduled Meds:  budesonide -glycopyrrolate -formoterol   2 puff Inhalation BID   Chlorhexidine  Gluconate Cloth  6 each Topical Daily   feeding supplement  237 mL Oral BID BM   Gerhardt's butt cream   Topical BID   multivitamin with minerals  1 tablet Oral Daily   mouth rinse  15 mL Mouth Rinse TID   pantoprazole  (PROTONIX ) IV  40 mg Intravenous Q12H   Continuous Infusions:      Unresulted Labs (From admission, onward)     Start     Ordered   02/06/24 0500  Basic metabolic panel with GFR  Tomorrow morning,   R       Question:  Specimen collection method  Answer:  IV Team=IV Team collect   02/05/24 0639   02/05/24 0805  Hemoglobin and hematocrit, blood  Now then every 6 hours,   R (with TIMED occurrences)     Comments: Call MD for hemoglobin 9 or less    02/05/24 0653             LOS:  LOS: 19 days   Time Spent: 40 minutes  Ravan Schlemmer Al-Sultani, MD [facility]  If 7PM-7AM, please contact night-coverage  02/05/2024, 6:36 AM      "

## 2024-02-05 NOTE — Interval H&P Note (Signed)
 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  The various methods of treatment have been discussed with the patient and family. After consideration of risks, benefits and other options for treatment, the patient has consented to  Procedures: EGD (ESOPHAGOGASTRODUODENOSCOPY) (N/A) as a surgical intervention.  The patient's history has been reviewed, patient examined, no change in status, stable for surgery.  I have reviewed the patient's chart and labs.  Questions were answered to the patient's satisfaction.    No further bleeding reported since yesterday afternoon   Glendia FORBES Holt

## 2024-02-05 NOTE — Progress Notes (Signed)
 PHARMACY - ANTICOAGULATION CONSULT NOTE  Pharmacy Consult for heparin   Indication: atrial fibrillation and chronic RUE DVT  Allergies[1]  Patient Measurements: Height: 6' 0.01 (182.9 cm) Weight: (!) 136.7 kg (301 lb 5.9 oz) IBW/kg (Calculated) : 77.62 HEPARIN  DW (KG): 103.8  Vital Signs: Temp: 97.6 F (36.4 C) (01/05 0840) Temp Source: Temporal (01/05 0840) BP: 119/86 (01/05 0900) Pulse Rate: 88 (01/05 0900)  Labs: Recent Labs    02/03/24 1129 02/03/24 1225 02/04/24 0527 02/04/24 1616 02/04/24 1938 02/05/24 0653  HGB 16.5  --  15.3 15.0 15.5 15.3  HCT 49.8  --  46.5 45.1 48.3 46.2  PLT 211  --  197  --   --   --   CREATININE  --  1.43* 1.46*  --   --  1.37*    Estimated Creatinine Clearance: 70.8 mL/min (A) (by C-G formula based on SCr of 1.37 mg/dL (H)).   Assessment: 26 YOM w/ Hx AF with an RUE DVT in need of AC. Apixaban  PTA. Last dose was >7 day ago. He was admitted to [facility] w/  duodenal ulcer hemorrhage which was clipped on 12/17.   Resuming heparin  s/p EGD this AM, to monitor on heparin  for a few days before/if resuming apixaban   Goal of Therapy:  Heparin  level 0.3-0.5 units/mL Monitor platelets by anticoagulation protocol: Yes   Plan:  Restart heparin  at 2350 units / hr Heparin  level in 8 hours Daily heparin  level, CBC  Thank you. Olam Monte, PharmD 02/05/2024 9:19 AM        [1] No Known Allergies

## 2024-02-05 NOTE — Transfer of Care (Signed)
 Immediate Anesthesia Transfer of Care Note  Patient: Raymond Hurst  Procedure(s) Performed: EGD (ESOPHAGOGASTRODUODENOSCOPY)  Patient Location: PACU and Endoscopy Unit  Anesthesia Type:MAC  Level of Consciousness: drowsy  Airway & Oxygen Therapy: Patient Spontanous Breathing and Patient connected to nasal cannula oxygen  Post-op Assessment: Report given to RN and Post -op Vital signs reviewed and stable  Post vital signs: Reviewed and stable  Last Vitals:  Vitals Value Taken Time  BP 128/88 02/05/24 08:40  Temp 36.4 C 02/05/24 08:40  Pulse 83 02/05/24 08:43  Resp 17 02/05/24 08:43  SpO2 94 % 02/05/24 08:43  Vitals shown include unfiled device data.  Last Pain:  Vitals:   02/05/24 0840  TempSrc: Temporal  PainSc: Asleep      Patients Stated Pain Goal: 0 (02/04/24 1150)  Complications: No notable events documented.

## 2024-02-05 NOTE — Anesthesia Postprocedure Evaluation (Signed)
"   Anesthesia Post Note  Patient: Raymond Hurst  Procedure(s) Performed: EGD (ESOPHAGOGASTRODUODENOSCOPY)     Patient location during evaluation: PACU Anesthesia Type: MAC Level of consciousness: awake and alert and oriented Pain management: pain level controlled Vital Signs Assessment: post-procedure vital signs reviewed and stable Respiratory status: spontaneous breathing, nonlabored ventilation and respiratory function stable Cardiovascular status: stable and blood pressure returned to baseline Postop Assessment: no apparent nausea or vomiting Anesthetic complications: no   No notable events documented.  Last Vitals:  Vitals:   02/05/24 0850 02/05/24 0900  BP: (!) 125/94 119/86  Pulse: 83 88  Resp: 17 (!) 21  Temp:    SpO2: 97% 95%    Last Pain:  Vitals:   02/05/24 0900  TempSrc:   PainSc: 0-No pain                 Kyli Sorter A.      "

## 2024-02-05 NOTE — Progress Notes (Signed)
 "  Progress Note  * Day of Surgery *  Subjective: Patient reports no pain. Has not had BM since yesterday. Eating breakfast during encounter. No nausea or vomiting so far. Having flatulence.   ROS  All negative with the exception of above.  Objective: Vital signs in last 24 hours: Temp:  [97.4 F (36.3 C)-97.7 F (36.5 C)] 97.6 F (36.4 C) (01/05 0840) Pulse Rate:  [80-93] 88 (01/05 0900) Resp:  [14-21] 21 (01/05 0900) BP: (119-153)/(71-94) 119/86 (01/05 0900) SpO2:  [94 %-99 %] 95 % (01/05 0900) Last BM Date : (P) 02/04/24  Intake/Output from previous day: 01/04 0701 - 01/05 0700 In: 1229.4 [P.O.:240; I.V.:989.4] Out: 4020 [Urine:4020] Intake/Output this shift: Total I/O In: 250 [I.V.:250] Out: 800 [Urine:800]  PE: General: Pleasant male who is laying in bed eating breakfast in NAD. HEENT: Head is normocephalic, atraumatic.   Heart: HR normal during encounter.  Lungs: Respiratory effort nonlabored. Abd: Soft, NT, ND. Midline wound is clean with wet to dry dressings. No rebound tenderness or guarding.  MS: Able to move all 4 extremities.  Skin: Warm and dry.  Psych: A&Ox3 with an appropriate affect.    Lab Results:  Recent Labs    02/03/24 1129 02/04/24 0527 02/04/24 1616 02/04/24 1938 02/05/24 0653  WBC 7.7 8.6  --   --   --   HGB 16.5 15.3   < > 15.5 15.3  HCT 49.8 46.5   < > 48.3 46.2  PLT 211 197  --   --   --    < > = values in this interval not displayed.   BMET Recent Labs    02/04/24 0527 02/05/24 0653  NA 137 138  K 4.2 4.4  CL 102 102  CO2 23 26  GLUCOSE 99 104*  BUN 33* 33*  CREATININE 1.46* 1.37*  CALCIUM  9.0 9.2   PT/INR No results for input(s): LABPROT, INR in the last 72 hours. CMP     Component Value Date/Time   NA 138 02/05/2024 0653   NA 137 06/01/2023 1439   K 4.4 02/05/2024 0653   CL 102 02/05/2024 0653   CO2 26 02/05/2024 0653   GLUCOSE 104 (H) 02/05/2024 0653   BUN 33 (H) 02/05/2024 0653   BUN 9 06/01/2023  1439   CREATININE 1.37 (H) 02/05/2024 0653   CREATININE 0.91 01/12/2024 1048   CALCIUM  9.2 02/05/2024 0653   PROT 6.2 (L) 01/29/2024 0330   PROT 6.2 06/01/2023 1439   ALBUMIN 3.0 (L) 01/29/2024 0330   ALBUMIN 3.0 (L) 06/01/2023 1439   AST 50 (H) 01/29/2024 0330   AST 14 (L) 01/12/2024 1048   ALT 58 (H) 01/29/2024 0330   ALT 5 01/12/2024 1048   ALKPHOS 92 01/29/2024 0330   BILITOT 0.9 01/29/2024 0330   BILITOT 0.5 01/12/2024 1048   GFRNONAA 55 (L) 02/05/2024 0653   GFRNONAA >60 01/12/2024 1048   GFRAA >60 06/13/2019 1350   Lipase     Component Value Date/Time   LIPASE 28 09/07/2023 2047       Studies/Results: CT ANGIO GI BLEED Result Date: 02/03/2024 CLINICAL DATA:  Lower gastrointestinal bleeding EXAM: CTA ABDOMEN AND PELVIS WITHOUT AND WITH CONTRAST TECHNIQUE: Multidetector CT imaging of the abdomen and pelvis was performed using the standard protocol during bolus administration of intravenous contrast. Multiplanar reconstructed images and MIPs were obtained and reviewed to evaluate the vascular anatomy. RADIATION DOSE REDUCTION: This exam was performed according to the departmental dose-optimization program which includes automated exposure  control, adjustment of the mA and/or kV according to patient size and/or use of iterative reconstruction technique. CONTRAST:  75mL OMNIPAQUE  IOHEXOL  350 MG/ML SOLN COMPARISON:  January 06, 2024 FINDINGS: VASCULAR Aorta: Aortic atherosclerosis without aneurysm or dissection. Celiac: Patent without evidence of aneurysm, dissection, vasculitis or significant stenosis. SMA: Patent without evidence of aneurysm, dissection, vasculitis or significant stenosis. Renals: Both renal arteries are patent without evidence of aneurysm, dissection, vasculitis, fibromuscular dysplasia or significant stenosis. IMA: Patent without evidence of aneurysm, dissection, vasculitis or significant stenosis. Inflow: Patent without evidence of aneurysm, dissection, vasculitis  or significant stenosis. Proximal Outflow: Bilateral common femoral and visualized portions of the superficial and profunda femoral arteries are patent without evidence of aneurysm, dissection, vasculitis or significant stenosis. Veins: No obvious venous abnormality within the limitations of this arterial phase study. Review of the MIP images confirms the above findings. NON-VASCULAR Lower chest: No acute abnormality. Hepatobiliary: No focal liver abnormality is seen. No gallstones, gallbladder wall thickening, or biliary dilatation. Pancreas: Unremarkable. No pancreatic ductal dilatation or surrounding inflammatory changes. Spleen: Normal in size without focal abnormality. Adrenals/Urinary Tract: Stable right adrenal myelolipoma. Left adrenal gland is unremarkable. Stable bilateral renal cysts. No hydronephrosis or renal obstruction. Urinary bladder is decompressed secondary to Foley catheter. Large left-sided diverticulum is again noted. Stomach/Bowel: The stomach is unremarkable. The appendix appears normal. There is no evidence of bowel obstruction. Diverticulosis is noted throughout the colon. Minimal inflammatory changes are seen adjacent to proximal sigmoid colon suggesting possible mild diverticulitis. Lymphatic: No adenopathy is noted. Reproductive: Prostatic calcifications are again noted and unchanged. Other: No ascites or hernia is noted. Musculoskeletal: Old L1 compression fracture is noted. IMPRESSION: 1. No evidence of active gastrointestinal bleeding. 2. Diverticulosis is noted throughout the colon. Minimal inflammatory changes are seen adjacent to proximal sigmoid colon suggesting possible mild diverticulitis. 3. Stable right adrenal myelolipoma. 4. Stable bilateral renal cysts. 5. Urinary bladder is decompressed secondary to Foley catheter. Large left-sided diverticulum is again noted. 6. Aortic atherosclerosis. Aortic Atherosclerosis (ICD10-I70.0). Electronically Signed   By: Lynwood Landy Raddle M.D.    On: 02/03/2024 15:40    Anti-infectives: Anti-infectives (From admission, onward)    Start     Dose/Rate Route Frequency Ordered Stop   01/29/24 2200  fluconazole  (DIFLUCAN ) IVPB 400 mg  Status:  Discontinued        400 mg 100 mL/hr over 120 Minutes Intravenous Every 24 hours 01/29/24 1336 01/29/24 1346   01/29/24 2200  fluconazole  (DIFLUCAN ) IVPB 400 mg  Status:  Discontinued        400 mg 200 mL/hr over 60 Minutes Intravenous Every 24 hours 01/29/24 1346 01/30/24 1533   01/25/24 1500  vancomycin  (VANCOREADY) IVPB 1500 mg/300 mL  Status:  Discontinued        1,500 mg 150 mL/hr over 120 Minutes Intravenous Every 24 hours 01/24/24 1322 01/30/24 1233   01/24/24 1115  vancomycin  (VANCOCIN ) 2,500 mg in sodium chloride  0.9 % 500 mL IVPB        2,500 mg 262.5 mL/hr over 120 Minutes Intravenous  Once 01/24/24 1029 01/24/24 1530   01/17/24 2245  piperacillin -tazobactam (ZOSYN ) IVPB 3.375 g  Status:  Discontinued        3.375 g 12.5 mL/hr over 240 Minutes Intravenous Every 8 hours 01/17/24 2149 01/30/24 1233   01/17/24 2200  fluconazole  (DIFLUCAN ) IVPB 400 mg  Status:  Discontinued        400 mg 100 mL/hr over 120 Minutes Intravenous Every 24 hours 01/17/24 2059 01/29/24 1336  Assessment/Plan POD 19 S/P LAPAROTOMY, EXPLORATORY, Duodenotomy, Ligation of GDA and branches, ABThera wound VAC placement Dr. Rubin  POD 17 S/P exp lap, abdominal wall closure Dr. Ebbie; some bleeding from liver capsule controlled with cautery and surgicel snow.  - Afebrile.  - HGB 15.3. Continue to monitor. - UGI 12/26 w/o evidence of leak. - Tolerating soft diet without n/v. Denies abdominal pain. No BM since yesterday which was bloody. CT angio A/P 1/3 showed no evidence of active gastrointestinal bleeding. Minimal inflammatory changes seen adjacent to proximal sigmoid colon suggesting possible mild diverticulitis.  - GI completed upper endoscopy revealing suture material near friable polypoid  mucosa in duodenal bulb. Heparin  drip planned to be resumed. DOAC being held.  - Continue dressing changes  FEN: Soft; IVF per primary team VTE: Heparin  infusion 100 units/mL ID: None currently  Per TR[facility]: Metastatic lung CA VDRF COPD OSA AKI  PMH CVA PMH afib on eliquis  - recommend hold DOAC with recurrent GIB    LOS: 19 days   I reviewed specialist notes, nursing notes, consulting provider notes, procedure notes, hospitalist notes, last 24 h vitals and pain scores, last 48 h intake and output, last 24 h labs and trends, and last 24 h imaging results.  This care required moderate level of medical decision making.    Marjorie Carlyon Favre, [facility] 02/05/2024, 11:00 AM Please see Amion for pager number during day hours 7:00am-4:30pm  "

## 2024-02-06 ENCOUNTER — Encounter (HOSPITAL_COMMUNITY): Payer: Self-pay | Admitting: Gastroenterology

## 2024-02-06 ENCOUNTER — Ambulatory Visit: Admitting: Internal Medicine

## 2024-02-06 DIAGNOSIS — I82629 Acute embolism and thrombosis of deep veins of unspecified upper extremity: Secondary | ICD-10-CM | POA: Diagnosis not present

## 2024-02-06 DIAGNOSIS — I4891 Unspecified atrial fibrillation: Secondary | ICD-10-CM | POA: Diagnosis not present

## 2024-02-06 DIAGNOSIS — C3490 Malignant neoplasm of unspecified part of unspecified bronchus or lung: Secondary | ICD-10-CM

## 2024-02-06 DIAGNOSIS — I82A12 Acute embolism and thrombosis of left axillary vein: Secondary | ICD-10-CM | POA: Diagnosis not present

## 2024-02-06 DIAGNOSIS — K264 Chronic or unspecified duodenal ulcer with hemorrhage: Secondary | ICD-10-CM | POA: Diagnosis not present

## 2024-02-06 LAB — CBC
HCT: 43 % (ref 39.0–52.0)
Hemoglobin: 14.5 g/dL (ref 13.0–17.0)
MCH: 30.2 pg (ref 26.0–34.0)
MCHC: 33.7 g/dL (ref 30.0–36.0)
MCV: 89.6 fL (ref 80.0–100.0)
Platelets: 183 K/uL (ref 150–400)
RBC: 4.8 MIL/uL (ref 4.22–5.81)
RDW: 15.2 % (ref 11.5–15.5)
WBC: 6.3 K/uL (ref 4.0–10.5)
nRBC: 0 % (ref 0.0–0.2)

## 2024-02-06 LAB — HEMOGLOBIN AND HEMATOCRIT, BLOOD
HCT: 44.4 % (ref 39.0–52.0)
HCT: 43.1 % (ref 39.0–52.0)
Hemoglobin: 14.9 g/dL (ref 13.0–17.0)
Hemoglobin: 14 g/dL (ref 13.0–17.0)

## 2024-02-06 LAB — BASIC METABOLIC PANEL WITH GFR
Anion gap: 12 (ref 5–15)
BUN: 34 mg/dL — ABNORMAL HIGH (ref 8–23)
CO2: 23 mmol/L (ref 22–32)
Calcium: 8.9 mg/dL (ref 8.9–10.3)
Chloride: 100 mmol/L (ref 98–111)
Creatinine, Ser: 1.31 mg/dL — ABNORMAL HIGH (ref 0.61–1.24)
GFR, Estimated: 58 mL/min — ABNORMAL LOW
Glucose, Bld: 100 mg/dL — ABNORMAL HIGH (ref 70–99)
Potassium: 4 mmol/L (ref 3.5–5.1)
Sodium: 135 mmol/L (ref 135–145)

## 2024-02-06 LAB — HEPARIN LEVEL (UNFRACTIONATED)
Heparin Unfractionated: 0.78 [IU]/mL — ABNORMAL HIGH (ref 0.30–0.70)
Heparin Unfractionated: 0.58 [IU]/mL (ref 0.30–0.70)

## 2024-02-06 LAB — APTT: aPTT: 96 s — ABNORMAL HIGH (ref 24–36)

## 2024-02-06 MED ORDER — SODIUM CHLORIDE (PF) 0.9 % IJ SOLN
INTRAMUSCULAR | Status: AC
  Filled 2024-02-06: qty 10

## 2024-02-06 NOTE — Progress Notes (Signed)
 [facility] ROUNDING NOTE   Subjective: Patient had a moderate-sized bowel movement this afternoon which I witnessed which was brown in color.  Hemoglobin has been stable.  No new complaints.   Objective: Vital signs in last 24 hours: Temp:  [97.6 F (36.4 C)-98.2 F (36.8 C)] 97.6 F (36.4 C) (01/06 2034) Pulse Rate:  [69-86] 86 (01/06 2034) Resp:  [18-23] 23 (01/06 2034) BP: (111-129)/(67-84) 118/67 (01/06 2034) SpO2:  [97 %-100 %] 98 % (01/06 2034) Last BM Date : 02/06/24 General: NAD, pleasant elderly Caucasian male Lungs:  CTA b/l, no w/r/r Heart:  RRR, no m/r/g Abdomen:  Soft, NT, ND, +BS, abdominal dressing not removed Ext:  No c/c/e    Intake/Output from previous day: 01/05 0701 - 01/06 0700 In: 3687.5 [P.O.:2990; I.V.:697.5] Out: 4250 [Urine:4250] Intake/Output this shift: Total I/O In: -  Out: 700 [Urine:700]   Lab Results: Recent Labs    02/04/24 0527 02/04/24 1616 02/05/24 1956 02/06/24 0350 02/06/24 0836  WBC 8.6  --   --  6.3  --   HGB 15.3   < > 14.4 14.5 14.9  PLT 197  --   --  183  --   MCV 90.3  --   --  89.6  --    < > = values in this interval not displayed.   BMET Recent Labs    02/04/24 0527 02/05/24 0653 02/06/24 0350  NA 137 138 135  K 4.2 4.4 4.0  CL 102 102 100  CO2 23 26 23   GLUCOSE 99 104* 100*  BUN 33* 33* 34*  CREATININE 1.46* 1.37* 1.31*  CALCIUM  9.0 9.2 8.9   LFT No results for input(s): PROT, ALBUMIN, AST, ALT, ALKPHOS, BILITOT, BILIDIR, IBILI in the last 72 hours. PT/INR No results for input(s): INR in the last 72 hours.    Imaging/Other results: No results found.  EGD 02/05/2024 Normal esophagus Scattered mild inflammation characterized by erythema in the gastric body Suture material in the duodenal bulb with associated moderately friable and polypoid reactive mucosa without active bleeding No ulcer or other high risk bleeding lesions identified.  No evidence of recent  bleeding in the upper GI tract.    Assessment and Plan:  74 year old male with history of COPD, sleep apnea, stage IV lung cancer, history of A-fib, history of CVA admitted with upper GI bleed from duodenal bulb ulcer, which continued to bleed despite endoscopic therapies and IR embolization, eventually requiring ex lap, duodenotomy and ligation of the GDA and branches on January 17, 2024. He experienced recurrent bleeding January 3 after restarting Eliquis  January 2.  Eliquis  was again stopped January 3 and he underwent a repeat upper endoscopy January 5 which showed post surgical changes in the duodenal bulb characterized by suture material and friable mucosa, but no ulcer and no evidence of recurrent or active bleeding.  He was restarted on a heparin  drip January 5 after the EGD.  Duodenal bulb ulcer with hemorrhage requiring surgical hemostasis, with rebleeding after initiation of Eliquis  - EGD 02/05/2024 without high risk bleeding lesions.  Suspect patient must of bled from friable mucosa in the setting of systemic anticoagulation - Very low suspicion for a secondary bleeding source - Patient has had no further bleeding on heparin  drip for the past 24 hours - Consider resuming Eliquis  tomorrow morning or the next morning and continue to observe for repeat bleeding - If recurrent bleeding is noted, we can consider looking for other bleeding sources such as the colon  Atrial  fibrillation - CHA2DS2-VASc greater than 4 - Systemic anticoagulation strongly recommended by cardiology - Will try to restart oral anticoagulation in the next day or so.  Right upper extremity DVT - Noted on Doppler ultrasound 1229 - Additional indication for anticoagulation  Lung cancer - Currently receiving treatment which is on hold given his hospitalization  Glendia FORBES Holt, MD  02/06/2024, 9:00 PM [facility]  Moderate complex medical decision making (this includes chart review, review of  results, face-to-face time used for counseling as well as treatment plan and follow-up. The patient was provided an opportunity to ask questions and all were answered. The patient agreed with the plan and demonstrated an understanding of the instructions

## 2024-02-06 NOTE — Progress Notes (Signed)
 PHARMACY - ANTICOAGULATION CONSULT NOTE  Pharmacy Consult for heparin   Indication: atrial fibrillation and chronic RUE DVT  Allergies[1]  Patient Measurements: Height: 6' 0.01 (182.9 cm) Weight: (!) 136.7 kg (301 lb 5.9 oz) IBW/kg (Calculated) : 77.62 HEPARIN  DW (KG): 103.8  Vital Signs: Temp: 97.7 F (36.5 C) (01/06 0350) BP: 111/68 (01/06 0350) Pulse Rate: 75 (01/06 0350)  Labs: Recent Labs    02/03/24 1129 02/03/24 1225 02/04/24 0527 02/04/24 1616 02/05/24 0653 02/05/24 1414 02/05/24 1956 02/06/24 0350  HGB 16.5  --  15.3   < > 15.3 14.2 14.4 14.5  HCT 49.8  --  46.5   < > 46.2 43.2 42.8 43.0  PLT 211  --  197  --   --   --   --  183  HEPARINUNFRC  --   --   --   --   --   --  0.68 0.78*  CREATININE  --    < > 1.46*  --  1.37*  --   --  1.31*   < > = values in this interval not displayed.    Estimated Creatinine Clearance: 74 mL/min (A) (by C-G formula based on SCr of 1.31 mg/dL (H)).   Assessment: 55 YOM w/ Hx AF with an RUE DVT in need of AC. Apixaban  PTA. Last dose was >7 day ago. He was admitted to [facility] w/  duodenal ulcer hemorrhage which was clipped on 12/17.   Resuming heparin  s/p EGD this AM, to monitor on heparin  for a few days before/if resuming apixaban   AM: heparin  level supra-therapeutic on 2350 units/hr. Per RN, no signs/symptoms of bleeding, level drawn appropriately. CBC stable.  Goal of Therapy:  Heparin  level 0.3-0.5 units/mL Monitor platelets by anticoagulation protocol: Yes   Plan:  Decrease heparin  to 2100 units / hr 8h heparin  level Daily heparin  level, CBC  Lynwood Poplar, PharmD, BCPS Clinical Pharmacist 02/06/2024 5:26 AM     [1] No Known Allergies

## 2024-02-06 NOTE — Progress Notes (Signed)
 1 Day Post-Op   Subjective/Chief Complaint: Pt doing well this AM Had BMyesteday a small dark blood per pt   Objective: Vital signs in last 24 hours: Temp:  [97.7 F (36.5 C)-98.2 F (36.8 C)] 98.2 F (36.8 C) (01/06 0817) Pulse Rate:  [75-94] 85 (01/06 0817) Resp:  [18-19] 18 (01/06 0817) BP: (109-129)/(65-82) 129/82 (01/06 0817) SpO2:  [95 %-100 %] 100 % (01/06 0817) Last BM Date : 02/04/24  Intake/Output from previous day: 01/05 0701 - 01/06 0700 In: 3687.5 [P.O.:2990; I.V.:697.5] Out: 4250 [Urine:4250] Intake/Output this shift: No intake/output data recorded.  General appearance: alert and cooperative GI: soft, non-tender; bowel sounds normal; no masses,  no organomegaly and inc c/d/I, a little bit of fat necrosis  Lab Results:  Recent Labs    02/04/24 0527 02/04/24 1616 02/06/24 0350 02/06/24 0836  WBC 8.6  --  6.3  --   HGB 15.3   < > 14.5 14.9  HCT 46.5   < > 43.0 44.4  PLT 197  --  183  --    < > = values in this interval not displayed.   BMET Recent Labs    02/05/24 0653 02/06/24 0350  NA 138 135  K 4.4 4.0  CL 102 100  CO2 26 23  GLUCOSE 104* 100*  BUN 33* 34*  CREATININE 1.37* 1.31*  CALCIUM  9.2 8.9   PT/INR No results for input(s): LABPROT, INR in the last 72 hours. ABG No results for input(s): PHART, HCO3 in the last 72 hours.  Invalid input(s): PCO2, PO2  Studies/Results: No results found.  Anti-infectives: Anti-infectives (From admission, onward)    Start     Dose/Rate Route Frequency Ordered Stop   01/29/24 2200  fluconazole  (DIFLUCAN ) IVPB 400 mg  Status:  Discontinued        400 mg 100 mL/hr over 120 Minutes Intravenous Every 24 hours 01/29/24 1336 01/29/24 1346   01/29/24 2200  fluconazole  (DIFLUCAN ) IVPB 400 mg  Status:  Discontinued        400 mg 200 mL/hr over 60 Minutes Intravenous Every 24 hours 01/29/24 1346 01/30/24 1533   01/25/24 1500  vancomycin  (VANCOREADY) IVPB 1500 mg/300 mL  Status:  Discontinued         1,500 mg 150 mL/hr over 120 Minutes Intravenous Every 24 hours 01/24/24 1322 01/30/24 1233   01/24/24 1115  vancomycin  (VANCOCIN ) 2,500 mg in sodium chloride  0.9 % 500 mL IVPB        2,500 mg 262.5 mL/hr over 120 Minutes Intravenous  Once 01/24/24 1029 01/24/24 1530   01/17/24 2245  piperacillin -tazobactam (ZOSYN ) IVPB 3.375 g  Status:  Discontinued        3.375 g 12.5 mL/hr over 240 Minutes Intravenous Every 8 hours 01/17/24 2149 01/30/24 1233   01/17/24 2200  fluconazole  (DIFLUCAN ) IVPB 400 mg  Status:  Discontinued        400 mg 100 mL/hr over 120 Minutes Intravenous Every 24 hours 01/17/24 2059 01/29/24 1336       Assessment/Plan: POD 20 S/P LAPAROTOMY, EXPLORATORY, Duodenotomy, Ligation of GDA and branches, ABThera wound VAC placement Dr. Rubin  POD 18 S/P exp lap, abdominal wall closure Dr. Ebbie; some bleeding from liver capsule controlled with cautery and surgicel snow.  - Afebrile.  - HGB stable - UGI 12/26 w/o evidence of leak. - Tolerating soft diet without n/v. Denies abdominal pain. No BM since yesterday which was bloody. CT angio A/P 1/3 showed no evidence of active gastrointestinal bleeding. Minimal inflammatory  changes seen adjacent to proximal sigmoid colon suggesting possible mild diverticulitis.  - GI completed upper endoscopy revealing suture material near friable polypoid mucosa in duodenal bulb. Heparin  drip planned to be resumed. DOAC being held.  - Continue dressing changes -no further surgical plans   FEN: Soft; IVF per primary team VTE: Heparin  infusion 100 units/mL ID: None currently   Per TR[facility]: Metastatic lung CA VDRF COPD OSA AKI  PMH CVA PMH afib on eliquis  - recommend hold DOAC with recurrent GIB  LOS: 20 days    Raymond Hurst 02/06/2024

## 2024-02-06 NOTE — Progress Notes (Signed)
 Occupational Therapy Treatment Patient Details Name: Raymond Hurst MRN: [id number] DOB: 09-24-1950 Today's Date: 02/06/2024   History of present illness Pt is 74 yo initially presenting to [facility] then transferring to [facility] on 12/16 due to dizziness hematemesis. 12/17 pt with continuing ongoing blood loss anemia without recurrent hematemesis and was given 2 U PRBC. EGD with findings of duodenal active bleed which was clipped. Pt was intubated and admitted to ICU on 12/17 then transferred to [facility]. 12/19 ex lap abdominal wall closure with some bleeding from liver capsule controlled with cautery.  PMH includes afib, gout, GI bleed, stage IV lung cancer, alcohol abuse.   OT comments  Good progress toward patient focused goals.  Generalized CGA for in room mobility/toileting and light stand grooming task.  OT will continue efforts in the acute setting to address deficits and Patient will benefit from continued inpatient follow up therapy, <3 hours/day.      If plan is discharge home, recommend the following:  A lot of help with walking and/or transfers;Two people to help with bathing/dressing/bathroom;Assistance with cooking/housework;Direct supervision/assist for medications management;Assist for transportation;Help with stairs or ramp for entrance   Equipment Recommendations  BSC/3in1;Wheelchair cushion (measurements OT);Tub/shower bench    Recommendations for Other Services      Precautions / Restrictions Precautions Precautions: Fall Recall of Precautions/Restrictions: Intact Restrictions Weight Bearing Restrictions Per Provider Order: No       Mobility Bed Mobility Overal bed mobility: Needs Assistance Bed Mobility: Supine to Sit, Sit to Supine     Supine to sit: Modified independent (Device/Increase time) Sit to supine: Supervision        Transfers Overall transfer level: Needs assistance Equipment used: Rolling walker (2 wheels) Transfers: Sit to/from Stand Sit to Stand:  Contact guard assist                 Balance Overall balance assessment: Needs assistance Sitting-balance support: No upper extremity supported, Feet supported Sitting balance-Leahy Scale: Good     Standing balance support: Reliant on assistive device for balance Standing balance-Leahy Scale: Poor                             ADL either performed or assessed with clinical judgement   ADL       Grooming: Contact guard assist;Standing;Wash/dry hands           Upper Body Dressing : Supervision/safety;Sitting                          Extremity/Trunk Assessment Upper Extremity Assessment Upper Extremity Assessment: Generalized weakness            Vision Patient Visual Report: No change from baseline     Perception Perception Perception: Not tested   Praxis Praxis Praxis: Not tested   Communication Communication Communication: No apparent difficulties Factors Affecting Communication: Hearing impaired   Cognition Arousal: Alert Behavior During Therapy: WFL for tasks assessed/performed Cognition: No apparent impairments                               Following commands: Intact        Cueing   Cueing Techniques: Verbal cues  Exercises      Shoulder Instructions       General Comments      Pertinent Vitals/ Pain       Pain Assessment Pain Assessment:  No/denies pain                                                          Frequency  Min 2X/week        Progress Toward Goals  OT Goals(current goals can now be found in the care plan section)  Progress towards OT goals: Goals updated  Acute Rehab OT Goals OT Goal Formulation: With patient Time For Goal Achievement: 02/23/24 Potential to Achieve Goals: Good ADL Goals Pt Will Perform Upper Body Bathing: with set-up Pt Will Perform Lower Body Bathing: with supervision;sit to/from stand Pt Will Perform Upper Body Dressing: with  set-up;sitting Pt Will Perform Lower Body Dressing: with supervision;sit to/from stand Pt Will Transfer to Toilet: with modified independence;ambulating;regular height toilet Pt Will Perform Toileting - Clothing Manipulation and hygiene: with modified independence;sitting/lateral leans;sit to/from stand  Plan      Co-evaluation                 AM-PAC OT 6 Clicks Daily Activity     Outcome Measure   Help from another person eating meals?: None Help from another person taking care of personal grooming?: A Little Help from another person toileting, which includes using toliet, bedpan, or urinal?: A Little Help from another person bathing (including washing, rinsing, drying)?: A Lot Help from another person to put on and taking off regular upper body clothing?: A Little Help from another person to put on and taking off regular lower body clothing?: A Lot 6 Click Score: 17    End of Session Equipment Utilized During Treatment: Rolling walker (2 wheels)  OT Visit Diagnosis: Muscle weakness (generalized) (M62.81)   Activity Tolerance Patient tolerated treatment well   Patient Left in bed;with call bell/phone within reach   Nurse Communication Mobility status        Time: 9091-9074 OT Time Calculation (min): 17 min  Charges: OT General Charges $OT Visit: 1 Visit OT Treatments $Self Care/Home Management : 8-22 mins  02/06/2024  RP, OTR/L  Acute Rehabilitation Services  Office:  [phone]   Charlie JONETTA Halsted 02/06/2024, 9:28 AM

## 2024-02-06 NOTE — Progress Notes (Signed)
 PHARMACY - ANTICOAGULATION CONSULT NOTE  Pharmacy Consult for heparin   Indication: atrial fibrillation and chronic RUE DVT  Allergies[1]  Patient Measurements: Height: 6' 0.01 (182.9 cm) Weight: (!) 136.7 kg (301 lb 5.9 oz) IBW/kg (Calculated) : 77.62 HEPARIN  DW (KG): 103.8  Vital Signs: Temp: 97.6 F (36.4 C) (01/06 2034) Temp Source: Oral (01/06 2034) BP: 118/67 (01/06 2034) Pulse Rate: 86 (01/06 2034)  Labs: Recent Labs    02/04/24 0527 02/04/24 1616 02/05/24 0653 02/05/24 1414 02/05/24 1956 02/06/24 0350 02/06/24 0836 02/06/24 2117  HGB 15.3   < > 15.3   < > 14.4 14.5 14.9 14.0  HCT 46.5   < > 46.2   < > 42.8 43.0 44.4 43.1  PLT 197  --   --   --   --  183  --   --   APTT  --   --   --   --   --   --   --  96*  HEPARINUNFRC  --   --   --   --  0.68 0.78*  --  0.58  CREATININE 1.46*  --  1.37*  --   --  1.31*  --   --    < > = values in this interval not displayed.    Estimated Creatinine Clearance: 74 mL/min (A) (by C-G formula based on SCr of 1.31 mg/dL (H)).   Assessment: 40 YOM w/ Hx AF with an RUE DVT in need of AC. Apixaban  PTA. Last dose was >7 day ago. He was admitted to [facility] w/  duodenal ulcer hemorrhage which was clipped on 12/17.   Resuming heparin  s/p EGD this AM, to monitor on heparin  for a few days before/if resuming apixaban   aPTT 95 sec, heparin  level 0.58 (now correlating) so will utilize heparin  levels for monitoring. Both are supratherapeutic for lower goal. No bleeding noted.  Goal of Therapy:  Heparin  level 0.3-0.5 units/mL Monitor platelets by anticoagulation protocol: Yes   Plan:  Decrease heparin  to 1950 units / hr 8h heparin  level Per GI, consider resume Eliquis  1/7.  Vito Ralph, PharmD, BCPS Please see amion for complete clinical pharmacist phone list 02/06/2024 11:09 PM      [1] No Known Allergies

## 2024-02-06 NOTE — Progress Notes (Signed)
 " PROGRESS NOTE    Raymond Hurst  FMW:[id number] DOB: 1950/08/27 DOA: 01/16/2024 PCP: [name], MD    Brief Narrative:  The patient is a 74 year old male with PMHx of metastatic lung adenocarcinoma (LUL, PD-L1 20%) s/p carbo/taxol  + XRT (06-09/2023) on durvalumab , AFib on Eliquis , COPD, gout, OSA on CPAP, chronic lymphedema, previous alcohol abuse, UGIB 2/2 cratered duodenal ulcer s/p GDA embolization (09/09/2023), recurrent UGIB 2/2 gastric ulcer, who presented to [facility] on 01/16/2024 complaining of dizziness, nausea, and hematemesis, admitted for ABLA and hypotension secondary to acute UGIB s/p transfusion of 3 units pRBC, underwent EGD (12/17) which showed duodenal bulb bleeding possible from coil erosion s/p Hemospray. The patient was kept intubated and transferred to [facility] for IR eval. While in the ICU on 12/17, he went into hemorrhagic shock and required pressors and MTP, Kcentra , TXA, and factor 7. Underwent emergent ex-lap with duodenotomy and ligation of GDA with wound vac placement. Hgb and BP stabilized and he was off pressors. Underwent secondary closure of abdomen on 12/19 with JP drain placement. Extubated on 12/23. Transferred out of ICU on 12/26.   Assessment and Plan:  # Duodenal ulcer hemorrhage with coiling 09/2023 # s/p laparotomy 12/17, secondary closure 12/19 as above - Vomiting resolved since 12/26 - Off TPN on 12/31 - JP drain removed 12/31 - Tolerating mechanical soft diet - Back on IV PPI BID  # Recurrent GI bleed - Had multiple episodes of bloody and melanotic stools on 1/3 and 1/4 (images in media) - Vital signs remain stable - Hgb remains stable - General surgery reengaded, following - CTA GIB negative for active bleeding - GI following - EGD (1/5) showed no active bleeding or high risk bleeding lesions and the bleeding was suspected to be from friable mucosa in the duodenal bulb at the site of surgical repair after DOAC started - Continue heparin  drip   - Continue holding Eliquis , GI suggesting trial of resumption tomorrow or the day after. Would involve patient and wife prior to resuming - H&H stable, will stop q6h checks - IV protonix  BID   # Sepsis secondary to above  # Staph epi bacteremia-MRSE with resistances # Likely peritonitis - Bcx 12/21 postive for staph epi - Bcx 12/26 no growth - Formally consulted ID on 12/30  - Pt has completed course of antibiotics as of 12/30  - RIJ CVC removed 1/2  # AKI on admission--- resolving  - Baseline Cr 0.7 - 0.9. Most recently has been consistent 1.31-1.34 - Cr 1.31  # Atrial fibrillation  - CHADVASC >4 - Patient's family discussed with cardiology on 1/2, agreed to continue Eliquis . Please see cardiology consult note from 02/02/2024.  - Eliquis  was again stopped on 1/3 given episode of bloody stools/melena  # Right cephalic vein acute SVT # Right brachial vein acute DVT - Noted on RUE doppler on 12/29 - Eliquis  held as above  # Hypernatremia - Thought to be secondary to free water  deficit - Resolved  # Hypokalemia - Resolved  # Urinary retention - Foley was initially removed and patient was voiding well. However, noted to be retaining again today.  - Indwelling cath placed and will be kept at discharge. Will need outpt f/u with urology   # Left lung mass bronchogenic CA - Holding immunotherapy/chemotherapy - Outpatient follow-up   # Critical illness thrombocytopenia - resolved    Scheduled Meds:  budesonide -glycopyrrolate -formoterol   2 puff Inhalation BID   Chlorhexidine  Gluconate Cloth  6 each Topical Daily  feeding supplement  237 mL Oral BID BM   Gerhardt's butt cream   Topical BID   multivitamin with minerals  1 tablet Oral Daily   mouth rinse  15 mL Mouth Rinse TID   pantoprazole  (PROTONIX ) IV  40 mg Intravenous Q12H   sucralfate   1 g Oral TID WC & HS   Continuous Infusions:  heparin  2,100 Units/hr (02/06/24 0925)     PRN Meds:.acetaminophen , hydrALAZINE ,  HYDROcodone -acetaminophen , hydrOXYzine , ipratropium-albuterol , labetalol , ondansetron  (ZOFRAN ) IV, sodium chloride  flush  Current Outpatient Medications  Medication Instructions   acetaminophen  (TYLENOL ) 500 mg, Every 6 hours PRN   allopurinol  (ZYLOPRIM ) 100 mg, Daily   apixaban  (ELIQUIS ) 5 mg, Oral, 2 times daily, Okay to restart this medication on 08/29/2023   budesonide -glycopyrrolate -formoterol  (BREZTRI  AEROSPHERE) 160-9-4.8 MCG/ACT AERO inhaler 2 puffs, Inhalation, 2 times daily   cholecalciferol  (VITAMIN D3) 1,000 Units, Daily   cyanocobalamin  (VITAMIN B12) 100 mcg, Daily   folic acid  (FOLVITE ) 1 mg, Oral, Daily   furosemide  (LASIX ) 10 mg, Oral, Daily   ipratropium-albuterol  (DUONEB) 0.5-2.5 (3) MG/3ML SOLN 3 mLs, Nebulization, Every 6 hours PRN   loperamide  (IMODIUM  A-D) 2-4 mg, 4 times daily PRN   metoprolol  tartrate (LOPRESSOR ) 25 mg, Oral, 2 times daily   pantoprazole  (PROTONIX ) 40 MG tablet TAKE 1 TABLET (40 MG TOTAL) BY MOUTH TWICE A DAY BEFORE MEALS   thiamine  (VITAMIN B-1) 100 mg, Daily    DVT prophylaxis: SCDs Start: 01/16/24 1731   Code Status:   Code Status: Full Code  Family Communication: none today  Disposition Plan: To SNF. Pending resumption of Eliquis  and ensuring Hgb stability.    PT - Follow Up Recommendations: Skilled nursing-short term rehab (<3 hours/day) - PT equipment: BSC/3in1, Hospital bed OT - Follow Up Recommendations: Skilled nursing-short term rehab (<3 hours/day) -    Level of care: Telemetry  Consultants:  General surgery, ID, GI, PCCM   Subjective: NAEO.  Remained hemodynamically stable. Stated he had a small bloody bowel movement yesterday, but had no since. Had one brown bowel movement without any blood today. He is otherwise doing well and has no concerns. Eating well.    Objective: Vitals:   02/06/24 0817 02/06/24 1653 02/06/24 2034 02/06/24 2107  BP: 129/82 120/84 118/67   Pulse: 85 69 86   Resp: 18 19 (!) 23   Temp: 98.2 F  (36.8 C) 97.8 F (36.6 C) 97.6 F (36.4 C)   TempSrc:   Oral   SpO2: 100% 97% 98% 96%  Weight:      Height:        Intake/Output Summary (Last 24 hours) at 02/06/2024 2333 Last data filed at 02/06/2024 2200 Gross per 24 hour  Intake 2127.5 ml  Output 3400 ml  Net -1272.5 ml   Filed Weights   01/22/24 0232 01/25/24 0452 01/29/24 0406  Weight: 135.7 kg 127.2 kg (!) 136.7 kg    Examination:  Gen: NAD, A&Ox3 HEENT: NCAT Neck: Supple CV: RRR, no murmurs Resp: normal WOB, CTAB anteriorly Abd: Soft, NTND, no guarding, midline incision dressing c/d/i Ext: BLE swelling and chronic venous stasis changes Skin: Warm, dry Neuro: No focal deficits Psych: Calm, cooperative, appropriate affect    Data Reviewed: I have personally reviewed following labs and imaging studies  CBC: Recent Labs  Lab 02/01/24 0047 02/02/24 0326 02/03/24 1129 02/04/24 0527 02/04/24 1616 02/05/24 1414 02/05/24 1956 02/06/24 0350 02/06/24 0836 02/06/24 2117  WBC 7.6 7.4 7.7 8.6  --   --   --  6.3  --   --  HGB 15.2 15.7 16.5 15.3   < > 14.2 14.4 14.5 14.9 14.0  HCT 47.5 47.2 49.8 46.5   < > 43.2 42.8 43.0 44.4 43.1  MCV 91.5 89.7 90.5 90.3  --   --   --  89.6  --   --   PLT 179 193 211 197  --   --   --  183  --   --    < > = values in this interval not displayed.   Basic Metabolic Panel: Recent Labs  Lab 01/31/24 0312 02/01/24 0047 02/02/24 0326 02/03/24 1225 02/04/24 0527 02/05/24 0653 02/06/24 0350  NA 139 139 137 135 137 138 135  K 4.3 4.3 4.0 4.5 4.2 4.4 4.0  CL 104 104 103 102 102 102 100  CO2 24 24 22  21* 23 26 23   GLUCOSE 106* 92 120* 132* 99 104* 100*  BUN 39* 36* 33* 36* 33* 33* 34*  CREATININE 1.31* 1.32* 1.32* 1.43* 1.46* 1.37* 1.31*  CALCIUM  9.1 9.0 8.9 9.1 9.0 9.2 8.9  MG 2.4 2.3  --   --   --   --   --   PHOS 4.3 4.2  --   --   --   --   --    GFR: Estimated Creatinine Clearance: 74 mL/min (A) (by C-G formula based on SCr of 1.31 mg/dL (H)). Liver Function  Tests: No results for input(s): AST, ALT, ALKPHOS, BILITOT, PROT, ALBUMIN in the last 168 hours.  No results for input(s): LIPASE, AMYLASE in the last 168 hours. No results for input(s): AMMONIA in the last 168 hours. Coagulation Profile: No results for input(s): INR, PROTIME in the last 168 hours. Cardiac Enzymes: No results for input(s): CKTOTAL, CKMB, CKMBINDEX, TROPONINI in the last 168 hours. BNP (last 3 results) No results for input(s): PROBNP in the last 8760 hours. HbA1C: No results for input(s): HGBA1C in the last 72 hours. CBG: Recent Labs  Lab 02/01/24 1953  GLUCAP 110*   Lipid Profile: No results for input(s): CHOL, HDL, LDLCALC, TRIG, CHOLHDL, LDLDIRECT in the last 72 hours.   Thyroid  Function Tests: No results for input(s): TSH, T4TOTAL, FREET4, T3FREE, THYROIDAB in the last 72 hours. Anemia Panel: No results for input(s): VITAMINB12, FOLATE, FERRITIN, TIBC, IRON , RETICCTPCT in the last 72 hours. Sepsis Labs: No results for input(s): PROCALCITON, LATICACIDVEN in the last 168 hours.  No results found for this or any previous visit (from the past 240 hours).    Radiology Studies: No results found.   Scheduled Meds:  budesonide -glycopyrrolate -formoterol   2 puff Inhalation BID   Chlorhexidine  Gluconate Cloth  6 each Topical Daily   feeding supplement  237 mL Oral BID BM   Gerhardt's butt cream   Topical BID   multivitamin with minerals  1 tablet Oral Daily   mouth rinse  15 mL Mouth Rinse TID   pantoprazole  (PROTONIX ) IV  40 mg Intravenous Q12H   sucralfate   1 g Oral TID WC & HS   Continuous Infusions:  heparin  2,100 Units/hr (02/06/24 0925)       Unresulted Labs (From admission, onward)     Start     Ordered   02/08/24 0500  Heparin  level (unfractionated)  Daily,   R     Question:  Specimen collection method  Answer:  IV Team=IV Team collect   02/06/24 2313   02/07/24 0700   Heparin  level (unfractionated)  Once-Timed,   TIMED       Question:  Specimen collection  method  Answer:  IV Team=IV Team collect   02/06/24 2313   02/07/24 0500  CBC  Tomorrow morning,   R       Question:  Specimen collection method  Answer:  IV Team=IV Team collect   02/06/24 2337   02/07/24 0500  Basic metabolic panel with GFR  Tomorrow morning,   R       Question:  Specimen collection method  Answer:  IV Team=IV Team collect   02/06/24 2337             LOS:  LOS: 20 days   Time Spent: 40 minutes  Madeline Pho Al-Sultani, MD [facility]  If 7PM-7AM, please contact night-coverage  02/06/2024, 11:33 PM      "

## 2024-02-07 ENCOUNTER — Inpatient Hospital Stay

## 2024-02-07 ENCOUNTER — Encounter: Payer: Self-pay | Admitting: Internal Medicine

## 2024-02-07 ENCOUNTER — Inpatient Hospital Stay: Admitting: Nurse Practitioner

## 2024-02-07 DIAGNOSIS — I4891 Unspecified atrial fibrillation: Secondary | ICD-10-CM | POA: Diagnosis not present

## 2024-02-07 DIAGNOSIS — Z9189 Other specified personal risk factors, not elsewhere classified: Secondary | ICD-10-CM

## 2024-02-07 DIAGNOSIS — I890 Lymphedema, not elsewhere classified: Secondary | ICD-10-CM | POA: Diagnosis not present

## 2024-02-07 DIAGNOSIS — I82A12 Acute embolism and thrombosis of left axillary vein: Secondary | ICD-10-CM | POA: Diagnosis not present

## 2024-02-07 DIAGNOSIS — G4733 Obstructive sleep apnea (adult) (pediatric): Secondary | ICD-10-CM | POA: Diagnosis not present

## 2024-02-07 DIAGNOSIS — K264 Chronic or unspecified duodenal ulcer with hemorrhage: Secondary | ICD-10-CM | POA: Diagnosis not present

## 2024-02-07 DIAGNOSIS — E440 Moderate protein-calorie malnutrition: Secondary | ICD-10-CM | POA: Diagnosis not present

## 2024-02-07 DIAGNOSIS — I82629 Acute embolism and thrombosis of deep veins of unspecified upper extremity: Secondary | ICD-10-CM | POA: Diagnosis not present

## 2024-02-07 DIAGNOSIS — I482 Chronic atrial fibrillation, unspecified: Secondary | ICD-10-CM | POA: Diagnosis not present

## 2024-02-07 DIAGNOSIS — D62 Acute posthemorrhagic anemia: Secondary | ICD-10-CM | POA: Diagnosis not present

## 2024-02-07 DIAGNOSIS — E861 Hypovolemia: Secondary | ICD-10-CM | POA: Diagnosis not present

## 2024-02-07 DIAGNOSIS — R578 Other shock: Secondary | ICD-10-CM | POA: Diagnosis not present

## 2024-02-07 LAB — COMPREHENSIVE METABOLIC PANEL WITH GFR
ALT: 25 U/L (ref 0–44)
AST: 20 U/L (ref 15–41)
Albumin: 3.3 g/dL — ABNORMAL LOW (ref 3.5–5.0)
Alkaline Phosphatase: 80 U/L (ref 38–126)
Anion gap: 12 (ref 5–15)
BUN: 38 mg/dL — ABNORMAL HIGH (ref 8–23)
CO2: 21 mmol/L — ABNORMAL LOW (ref 22–32)
Calcium: 9 mg/dL (ref 8.9–10.3)
Chloride: 101 mmol/L (ref 98–111)
Creatinine, Ser: 1.37 mg/dL — ABNORMAL HIGH (ref 0.61–1.24)
GFR, Estimated: 55 mL/min — ABNORMAL LOW
Glucose, Bld: 136 mg/dL — ABNORMAL HIGH (ref 70–99)
Potassium: 4.3 mmol/L (ref 3.5–5.1)
Sodium: 135 mmol/L (ref 135–145)
Total Bilirubin: 0.5 mg/dL (ref 0.0–1.2)
Total Protein: 6.6 g/dL (ref 6.5–8.1)

## 2024-02-07 LAB — CBC WITH DIFFERENTIAL/PLATELET
Abs Immature Granulocytes: 0.05 K/uL (ref 0.00–0.07)
Basophils Absolute: 0.1 K/uL (ref 0.0–0.1)
Basophils Relative: 1 %
Eosinophils Absolute: 0.1 K/uL (ref 0.0–0.5)
Eosinophils Relative: 2 %
HCT: 41 % (ref 39.0–52.0)
Hemoglobin: 13.7 g/dL (ref 13.0–17.0)
Immature Granulocytes: 1 %
Lymphocytes Relative: 8 %
Lymphs Abs: 0.5 K/uL — ABNORMAL LOW (ref 0.7–4.0)
MCH: 29.4 pg (ref 26.0–34.0)
MCHC: 33.4 g/dL (ref 30.0–36.0)
MCV: 88 fL (ref 80.0–100.0)
Monocytes Absolute: 0.5 K/uL (ref 0.1–1.0)
Monocytes Relative: 7 %
Neutro Abs: 5.7 K/uL (ref 1.7–7.7)
Neutrophils Relative %: 81 %
Platelets: 196 K/uL (ref 150–400)
RBC: 4.66 MIL/uL (ref 4.22–5.81)
RDW: 15 % (ref 11.5–15.5)
WBC: 6.9 K/uL (ref 4.0–10.5)
nRBC: 0 % (ref 0.0–0.2)

## 2024-02-07 LAB — PHOSPHORUS: Phosphorus: 3.4 mg/dL (ref 2.5–4.6)

## 2024-02-07 LAB — HEPARIN LEVEL (UNFRACTIONATED): Heparin Unfractionated: 0.53 [IU]/mL (ref 0.30–0.70)

## 2024-02-07 LAB — MAGNESIUM: Magnesium: 2.1 mg/dL (ref 1.7–2.4)

## 2024-02-07 MED ORDER — SODIUM CHLORIDE (PF) 0.9 % IJ SOLN
INTRAMUSCULAR | Status: AC
  Administered 2024-02-07: 10 mL
  Filled 2024-02-07: qty 10

## 2024-02-07 NOTE — Progress Notes (Signed)
 [facility] ROUNDING NOTE   Subjective: Patient had a reportedly bloody bowel movement today.  Repeat CBC had been ordered but not yet collected.  Hemoglobin stable this morning.  Recommended continuing heparin  drip for now. Wife concerned about other possible bleeding sites.   Objective: Vital signs in last 24 hours: Temp:  [97.6 F (36.4 C)-98.4 F (36.9 C)] 98.4 F (36.9 C) (01/07 1745) Pulse Rate:  [86-106] 93 (01/07 1745) Resp:  [19-23] 19 (01/07 1745) BP: (115-145)/(67-117) 115/73 (01/07 1745) SpO2:  [93 %-98 %] 98 % (01/07 1745) Last BM Date : 02/07/24 General: NAD, chronically ill appearing Caucasian male Lungs:  CTA b/l, no w/r/r Heart:  RRR, no m/r/g Abdomen:  Soft, NT, ND, +BS, abdominal dressing not removed Ext: Bilateral 1+ lower extremity edema    Intake/Output from previous day: 01/06 0701 - 01/07 0700 In: 1440 [P.O.:1440] Out: 2100 [Urine:2100] Intake/Output this shift: No intake/output data recorded.   Lab Results: Recent Labs    02/06/24 0350 02/06/24 0836 02/06/24 2117 02/07/24 0958  WBC 6.3  --   --  6.9  HGB 14.5 14.9 14.0 13.7  PLT 183  --   --  196  MCV 89.6  --   --  88.0   BMET Recent Labs    02/05/24 0653 02/06/24 0350 02/07/24 0958  NA 138 135 135  K 4.4 4.0 4.3  CL 102 100 101  CO2 26 23 21*  GLUCOSE 104* 100* 136*  BUN 33* 34* 38*  CREATININE 1.37* 1.31* 1.37*  CALCIUM  9.2 8.9 9.0   LFT Recent Labs    02/07/24 0958  PROT 6.6  ALBUMIN 3.3*  AST 20  ALT 25  ALKPHOS 80  BILITOT 0.5   PT/INR No results for input(s): INR in the last 72 hours.    Imaging/Other results: No results found.    Assessment and Plan:  74 year old male with history of COPD, sleep apnea, stage IV lung cancer, history of A-fib, history of CVA admitted with upper GI bleed from duodenal bulb ulcer, which continued to bleed despite endoscopic therapies and IR embolization, eventually requiring ex lap, duodenotomy and  ligation of the GDA and branches on January 17, 2024. He experienced recurrent bleeding January 3 after restarting Eliquis  January 2.  Eliquis  was again stopped January 3 and he underwent a repeat upper endoscopy January 5 which showed post surgical changes in the duodenal bulb characterized by suture material and friable mucosa, but no ulcer and no evidence of recurrent or active bleeding.  He was restarted on a heparin  drip January 5 after the EGD.   Duodenal bulb ulcer with hemorrhage requiring surgical hemostasis, with rebleeding after initiation of Eliquis  - EGD 02/05/2024 without high risk bleeding lesions.  Suspect patient must have bled from friable mucosa in the setting of systemic anticoagulation - Very low suspicion for a secondary bleeding source, but if patient continues to bleed, I did offer a colonoscopy to definitively exclude a lower GI source - Given hemodynamic stability and stable hemoglobin, okay with continuing heparin  drip despite overtly bloody stools. - Will hold off on reinitiating oral anticoagulation while patient continuing to have bloody stools - Trend CBC every 12hr  Atrial fibrillation - CHA2DS2-VASc greater than 4 - Systemic anticoagulation strongly recommended by cardiology - Will try to restart oral anticoagulation in the next day or so.   Right upper extremity DVT - Noted on Doppler ultrasound 1229 - Additional indication for anticoagulation   Lung cancer - Treatment on hold given his  hospitalization   Glendia FORBES Holt, MD  02/07/2024, 8:25 PM [facility]  Moderate complex medical decision making (this includes chart review, review of results, face-to-face time used for counseling as well as treatment plan and follow-up. The patient was provided an opportunity to ask questions and all were answered. The patient agreed with the plan and demonstrated an understanding of the instructions

## 2024-02-07 NOTE — Progress Notes (Signed)
 "  Progress Note  2 Days Post-Op  Subjective: Patient denies pain. Had BM this morning with hematochezia. Denies n/v. Tolerating PO intake. Having flatulence.  ROS  All negative with the exception of above.  Objective: Vital signs in last 24 hours: Temp:  [97.6 F (36.4 C)-98.2 F (36.8 C)] 98.2 F (36.8 C) (01/07 0854) Pulse Rate:  [69-106] 106 (01/07 0854) Resp:  [19-23] 19 (01/07 0854) BP: (118-145)/(67-117) 145/117 (01/07 0854) SpO2:  [93 %-98 %] 93 % (01/07 0854) Last BM Date : 02/06/24  Intake/Output from previous day: 01/06 0701 - 01/07 0700 In: 1440 [P.O.:1440] Out: 2100 [Urine:2100] Intake/Output this shift: No intake/output data recorded.  PE: General: Pleasant male who is laying in bed in NAD. HEENT: Head is normocephalic, atraumatic.   Heart: HR mildly elevated during encounter.  Lungs: Respiratory effort nonlabored. Abd: Soft, NT, ND. Midline wound is clean with wet to dry dressings. No rebound tenderness or guarding.  MS: Able to move all 4 extremities.  Skin: Warm and dry.  Psych: A&Ox3 with an appropriate affect.    Lab Results:  Recent Labs    02/06/24 0350 02/06/24 0836 02/06/24 2117  WBC 6.3  --   --   HGB 14.5 14.9 14.0  HCT 43.0 44.4 43.1  PLT 183  --   --    BMET Recent Labs    02/05/24 0653 02/06/24 0350  NA 138 135  K 4.4 4.0  CL 102 100  CO2 26 23  GLUCOSE 104* 100*  BUN 33* 34*  CREATININE 1.37* 1.31*  CALCIUM  9.2 8.9   PT/INR No results for input(s): LABPROT, INR in the last 72 hours. CMP     Component Value Date/Time   NA 135 02/06/2024 0350   NA 137 06/01/2023 1439   K 4.0 02/06/2024 0350   CL 100 02/06/2024 0350   CO2 23 02/06/2024 0350   GLUCOSE 100 (H) 02/06/2024 0350   BUN 34 (H) 02/06/2024 0350   BUN 9 06/01/2023 1439   CREATININE 1.31 (H) 02/06/2024 0350   CREATININE 0.91 01/12/2024 1048   CALCIUM  8.9 02/06/2024 0350   PROT 6.2 (L) 01/29/2024 0330   PROT 6.2 06/01/2023 1439   ALBUMIN 3.0 (L)  01/29/2024 0330   ALBUMIN 3.0 (L) 06/01/2023 1439   AST 50 (H) 01/29/2024 0330   AST 14 (L) 01/12/2024 1048   ALT 58 (H) 01/29/2024 0330   ALT 5 01/12/2024 1048   ALKPHOS 92 01/29/2024 0330   BILITOT 0.9 01/29/2024 0330   BILITOT 0.5 01/12/2024 1048   GFRNONAA 58 (L) 02/06/2024 0350   GFRNONAA >60 01/12/2024 1048   GFRAA >60 06/13/2019 1350   Lipase     Component Value Date/Time   LIPASE 28 09/07/2023 2047       Studies/Results: No results found.  Anti-infectives: Anti-infectives (From admission, onward)    Start     Dose/Rate Route Frequency Ordered Stop   01/29/24 2200  fluconazole  (DIFLUCAN ) IVPB 400 mg  Status:  Discontinued        400 mg 100 mL/hr over 120 Minutes Intravenous Every 24 hours 01/29/24 1336 01/29/24 1346   01/29/24 2200  fluconazole  (DIFLUCAN ) IVPB 400 mg  Status:  Discontinued        400 mg 200 mL/hr over 60 Minutes Intravenous Every 24 hours 01/29/24 1346 01/30/24 1533   01/25/24 1500  vancomycin  (VANCOREADY) IVPB 1500 mg/300 mL  Status:  Discontinued        1,500 mg 150 mL/hr over 120 Minutes  Intravenous Every 24 hours 01/24/24 1322 01/30/24 1233   01/24/24 1115  vancomycin  (VANCOCIN ) 2,500 mg in sodium chloride  0.9 % 500 mL IVPB        2,500 mg 262.5 mL/hr over 120 Minutes Intravenous  Once 01/24/24 1029 01/24/24 1530   01/17/24 2245  piperacillin -tazobactam (ZOSYN ) IVPB 3.375 g  Status:  Discontinued        3.375 g 12.5 mL/hr over 240 Minutes Intravenous Every 8 hours 01/17/24 2149 01/30/24 1233   01/17/24 2200  fluconazole  (DIFLUCAN ) IVPB 400 mg  Status:  Discontinued        400 mg 100 mL/hr over 120 Minutes Intravenous Every 24 hours 01/17/24 2059 01/29/24 1336        Assessment/Plan POD 21 S/P LAPAROTOMY, EXPLORATORY, Duodenotomy, Ligation of GDA and branches, ABThera wound VAC placement Dr. Rubin  POD 19 S/P exp lap, abdominal wall closure Dr. Ebbie; some bleeding from liver capsule controlled with cautery and surgicel snow.  -  Afebrile. Mild tachycardia. - Labs pending this AM. Continue to monitor HGB. - UGI 12/26 w/o evidence of leak. - Tolerating soft diet without n/v. Denies abdominal pain. Reports BM this morning with hematochezia. CT angio A/P 1/3 showed no evidence of active gastrointestinal bleeding. Minimal inflammatory changes seen adjacent to proximal sigmoid colon suggesting possible mild diverticulitis.  - GI completed upper endoscopy revealing suture material near friable polypoid mucosa in duodenal bulb. Heparin  drip planned to be resumed. DOAC being held.  - Continue dressing changes - No other surgical plans.   FEN: Soft; IVF per primary team VTE: Heparin  infusion 100 units/mL ID: None currently   Per TR[facility]: Metastatic lung CA VDRF COPD OSA AKI  PMH CVA PMH afib on eliquis  - recommend hold DOAC with recurrent GIB   LOS: 21 days   I reviewed specialist notes, hospitalist notes, nursing notes, last 24 h vitals and pain scores, last 48 h intake and output, last 24 h labs and trends, and last 24 h imaging results.  This care required moderate level of medical decision making.    Marjorie Carlyon Favre, [facility] 02/07/2024, 10:07 AM Please see Amion for pager number during day hours 7:00am-4:30pm  "

## 2024-02-07 NOTE — Progress Notes (Signed)
 " PROGRESS NOTE    Raymond Hurst  FMW:[id number] DOB: [birth year] DOA: 01/16/2024 PCP: [name], MD   Brief Narrative:  74 year old male Left lung mass bronchogenic CA (poorly differentiated squamous cell) diagnosed 06/05/2023-bronchoscopy 08/09/2023 complicated by left PNA thorax-follows with Dr. Arlester carboplatin  and Taxol --currently durvalumab  Permanent A-fib CHA2DS2-VASc >4 on Eliquis  Previous heavy EtOH UGIB 09/09/2023 cratered ulcer duodenum status post mesenteric angio/embolization @ [facility] 12/6-12/8 readmission--with upper GI bleed clean-based ulcer told to continue twice daily PPI manage resumed Eliquis  hemoglobin was 9.6 on discharge  Significant Events: 12/16 [facility] ED eval dizziness nausea-couple of bright red vomit-->?  ICU [facility]--> 3 [facility]. [facility] transfer 2/2 12/17 ICU [facility]-->  [facility] transfer 2/2--IR duodenal bleed Clip ->?  Continued ongoing bleeding requiring pressors--- massive transfusion protocol-----emergent ex lap Dr. Rubin with duodenotomy ligation GDA branches VAC placement 12/19 Secondary closure of abdomen Dr. Ebbie  12/26 barium swallow performed and upper GI 1226 without evidence of leak.  He has been intermittently bleeding and had a bowel movement that was hematochezia and CT angio abdomen pelvis on 1 3 showed no evidence of active GI bleeding.  DOAC remains being held but he has been resumed on a heparin  drip and having intermittently bloody bowel movements and had a large mahogany colored stool today but hemoglobin remained stable.  Will continue heparin  drip for now and continue to monitor  Assessment and Plan:  Duodenal ulcer hemorrhage with coiling 09/2023  s/p laparotomy 12/17, secondary closure 12/19 as above - Vomiting resolved since 12/26.  Off TPN on 12/31.  JP drain removed 12/31 - Tolerating mechanical soft diet.  Back on IV PPI BID -PT/OT recommending SNF   Recurrent GI Bleed: Had multiple episodes of bloody and melanotic  stools on 1/3 and 1/4 (images in media) - Vital signs remain stable - Hgb remains stable but continues to have Bloody Bowel movements. Hgb/Hct Trend:  Recent Labs  Lab 02/05/24 0653 02/05/24 1414 02/05/24 1956 02/06/24 0350 02/06/24 0836 02/06/24 2117 02/07/24 0958  HGB 15.3 14.2 14.4 14.5 14.9 14.0 13.7  HCT 46.2 43.2 42.8 43.0 44.4 43.1 41.0  MCV  --   --   --  89.6  --   --  88.0  - General surgery re-engated, following; hemoglobin remained stable so with GI and general surgery and feel that is okay to continue heparin  from their standpoint - CTA GIB negative for active bleeding - GI following and considering resuming anticoagulation with Eliquis  in the a.m. or waiting a few days. - EGD (1/5) showed no active bleeding or high risk bleeding lesions and the bleeding was suspected to be from friable mucosa in the duodenal bulb at the site of surgical repair after DOAC started -Continue heparin  drip  -Continue holding Eliquis , GI suggesting trial of resumption tomorrow or the day after. Would involve patient and wife prior to resuming -H&H stable, will stop q6h checks - Continuing IV protonix  BID   Sepsis secondary to above  Staph epi bacteremia-MRSE with resistances Likely peritonitis - Bcx 12/21 postive for staph epi - Bcx 12/26 no growth - Formally consulted ID on 12/30  - Pt has completed course of antibiotics as of 12/30  - RIJ CVC removed 1/2 -WBC Trend:  Recent Labs  Lab 01/30/24 0323 02/01/24 0047 02/02/24 0326 02/03/24 1129 02/04/24 0527 02/06/24 0350 02/07/24 0958  WBC 8.7 7.6 7.4 7.7 8.6 6.3 6.9     AKI on admission Metabolic Acidosis - Baseline Cr 0.7 - 0.9. Most recently has been  consistent 1.31-1.34. Current BUN/Cr Trend: Recent Labs  Lab 01/31/24 0312 02/01/24 0047 02/02/24 0326 02/03/24 1225 02/04/24 0527 02/05/24 0653 02/06/24 0350  BUN 39* 36* 33* 36* 33* 33* 34*  CREATININE 1.31* 1.32* 1.32* 1.43* 1.46* 1.37* 1.31*  -Has a Slight Metabolic  Acidosis -Avoid Nephrotoxic Medications, Contrast Dyes, Hypotension and Dehydration to Ensure Adequate Renal Perfusion and will need to Renally Adjust Meds -Continue to Monitor and Trend Renal Function carefully and repeat CMP in the AM   Atrial fibrillation  -CHADVASC >4 -Patient's family discussed with cardiology on 1/2, agreed to continue Eliquis . Please see cardiology consult note from 02/02/2024.  - Eliquis  was again stopped on 1/3 given episode of bloody stools/melena and continuing Heparin  gtt for now and consideration of resumption in the AM    Right cephalic vein acute SVT Right brachial vein acute DVT -Noted on RUE doppler on 12/29 -Eliquis  held as above but Heparin  being continued    Hypernatremia: Thought to be secondary to free water  deficit. Na+ Trend:  Recent Labs  Lab 02/01/24 0047 02/02/24 0326 02/03/24 1225 02/04/24 0527 02/05/24 0653 02/06/24 0350 02/07/24 0958  NA 139 137 135 137 138 135 135  -Resolved. CTM and Trend and repeat CMP in the AM    Hypokalemia: Resolved. K+ Trend:  Recent Labs  Lab 02/01/24 0047 02/02/24 0326 02/03/24 1225 02/04/24 0527 02/05/24 0653 02/06/24 0350 02/07/24 0958  K 4.3 4.0 4.5 4.2 4.4 4.0 4.3  -CTM and Replete as Necessary. Repeat CMP in the AM    Urinary Retention - Foley was initially removed and patient was voiding well. However, noted to be retaining again today.  - Indwelling cath placed and will be kept at discharge. Will need outpt f/u with Urology   Left lung mass bronchogenic CA -Holding immunotherapy/chemotherapy -Will need Outpatient follow-up   Critical illness thrombocytopenia - resolved  Hypoalbuminemia: Patient's Albumin Trend: Recent Labs  Lab 01/16/24 1623 01/22/24 0320 01/25/24 0435 01/27/24 0316 01/28/24 0355 01/29/24 0330 02/07/24 0958  ALBUMIN 3.5 2.7* 2.6* 2.6* 2.9* 3.0* 3.3*  -Continue to Monitor and Trend and repeat CMP in the AM  Class III (Morbid) Obesity: Complicates overall  prognosis and care. Estimated body mass index is 40.86 kg/m as calculated from the following:   Height as of this encounter: 6' 0.01 (1.829 m).   Weight as of this encounter: 136.7 kg. Weight Loss and Dietary Counseling given   DVT prophylaxis: SCDs Start: 01/16/24 1731; Anticoagulated w/ Heparin  gtt    Code Status: Full Code Family Communication: No family present @ bedside   Disposition Plan:  Level of care: Telemetry Status is: Inpatient Remains inpatient appropriate because: Needs further clinical improvement clearance per specialists   Consultants:  Gastroenterology General Surgery  Procedures:  As delineated as above  Antimicrobials:  Anti-infectives (From admission, onward)    Start     Dose/Rate Route Frequency Ordered Stop   01/29/24 2200  fluconazole  (DIFLUCAN ) IVPB 400 mg  Status:  Discontinued        400 mg 100 mL/hr over 120 Minutes Intravenous Every 24 hours 01/29/24 1336 01/29/24 1346   01/29/24 2200  fluconazole  (DIFLUCAN ) IVPB 400 mg  Status:  Discontinued        400 mg 200 mL/hr over 60 Minutes Intravenous Every 24 hours 01/29/24 1346 01/30/24 1533   01/25/24 1500  vancomycin  (VANCOREADY) IVPB 1500 mg/300 mL  Status:  Discontinued        1,500 mg 150 mL/hr over 120 Minutes Intravenous Every 24 hours 01/24/24  1322 01/30/24 1233   01/24/24 1115  vancomycin  (VANCOCIN ) 2,500 mg in sodium chloride  0.9 % 500 mL IVPB        2,500 mg 262.5 mL/hr over 120 Minutes Intravenous  Once 01/24/24 1029 01/24/24 1530   01/17/24 2245  piperacillin -tazobactam (ZOSYN ) IVPB 3.375 g  Status:  Discontinued        3.375 g 12.5 mL/hr over 240 Minutes Intravenous Every 8 hours 01/17/24 2149 01/30/24 1233   01/17/24 2200  fluconazole  (DIFLUCAN ) IVPB 400 mg  Status:  Discontinued        400 mg 100 mL/hr over 120 Minutes Intravenous Every 24 hours 01/17/24 2059 01/29/24 1336       Subjective: Seen and examined at bedside and states that he had a rough night did not get very much  sleep and wanted to rest.  This afternoon he had a very large mahogany colored bowel movement but hemoglobin remained stable.  Currently chest pain or shortness of breath.  No other concerns or complaints at this time.  Objective: Vitals:   02/06/24 2034 02/06/24 2107 02/07/24 0854 02/07/24 1745  BP: 118/67  (!) 145/117 115/73  Pulse: 86  (!) 106 93  Resp: (!) 23  19 19   Temp: 97.6 F (36.4 C)  98.2 F (36.8 C) 98.4 F (36.9 C)  TempSrc: Oral     SpO2: 98% 96% 93% 98%  Weight:      Height:        Intake/Output Summary (Last 24 hours) at 02/07/2024 1920 Last data filed at 02/07/2024 0900 Gross per 24 hour  Intake 640 ml  Output 1600 ml  Net -960 ml   Filed Weights   01/22/24 0232 01/25/24 0452 01/29/24 0406  Weight: 135.7 kg 127.2 kg (!) 136.7 kg   Examination: Physical Exam:  Constitutional: WN/WD morbidly obese chronically ill-appearing Caucasian male who is resting a little somnolent and drowsy Respiratory: Diminished to auscultation bilaterally, no wheezing, rales, rhonchi or crackles. Normal respiratory effort and patient is not tachypenic. No accessory muscle use.  Unlabored breathing Cardiovascular: RRR, no murmurs / rubs / gallops. S1 and S2 auscultated.  Mild 1+ extremity edema Abdomen: Soft, slightly-tender, distended secondary to body habitus. Bowel sounds positive.  GU: Deferred. Musculoskeletal: No clubbing / cyanosis of digits/nails. No joint deformity upper and lower extremities.  Skin: No rashes, lesions, ulcers on limited skin evaluation. No induration; Warm and dry.  Neurologic: CN 2-12 grossly intact with no focal deficits. Romberg sign and cerebellar reflexes not assessed.  Psychiatric: Normal judgment and insight. Alert and oriented x 3. Normal mood and appropriate affect.   Data Reviewed: I have personally reviewed following labs and imaging studies  CBC: Recent Labs  Lab 02/02/24 0326 02/03/24 1129 02/04/24 0527 02/04/24 1616 02/05/24 1956  02/06/24 0350 02/06/24 0836 02/06/24 2117 02/07/24 0958  WBC 7.4 7.7 8.6  --   --  6.3  --   --  6.9  NEUTROABS  --   --   --   --   --   --   --   --  5.7  HGB 15.7 16.5 15.3   < > 14.4 14.5 14.9 14.0 13.7  HCT 47.2 49.8 46.5   < > 42.8 43.0 44.4 43.1 41.0  MCV 89.7 90.5 90.3  --   --  89.6  --   --  88.0  PLT 193 211 197  --   --  183  --   --  196   < > =  values in this interval not displayed.   Basic Metabolic Panel: Recent Labs  Lab 02/01/24 0047 02/02/24 0326 02/03/24 1225 02/04/24 0527 02/05/24 0653 02/06/24 0350 02/07/24 0958  NA 139   < > 135 137 138 135 135  K 4.3   < > 4.5 4.2 4.4 4.0 4.3  CL 104   < > 102 102 102 100 101  CO2 24   < > 21* 23 26 23  21*  GLUCOSE 92   < > 132* 99 104* 100* 136*  BUN 36*   < > 36* 33* 33* 34* 38*  CREATININE 1.32*   < > 1.43* 1.46* 1.37* 1.31* 1.37*  CALCIUM  9.0   < > 9.1 9.0 9.2 8.9 9.0  MG 2.3  --   --   --   --   --  2.1  PHOS 4.2  --   --   --   --   --  3.4   < > = values in this interval not displayed.   GFR: Estimated Creatinine Clearance: 70.8 mL/min (A) (by C-G formula based on SCr of 1.37 mg/dL (H)). Liver Function Tests: Recent Labs  Lab 02/07/24 0958  AST 20  ALT 25  ALKPHOS 80  BILITOT 0.5  PROT 6.6  ALBUMIN 3.3*   No results for input(s): LIPASE, AMYLASE in the last 168 hours. No results for input(s): AMMONIA in the last 168 hours. Coagulation Profile: No results for input(s): INR, PROTIME in the last 168 hours. Cardiac Enzymes: No results for input(s): CKTOTAL, CKMB, CKMBINDEX, TROPONINI in the last 168 hours. BNP (last 3 results) No results for input(s): PROBNP in the last 8760 hours. HbA1C: No results for input(s): HGBA1C in the last 72 hours. CBG: Recent Labs  Lab 02/01/24 1953  GLUCAP 110*   Lipid Profile: No results for input(s): CHOL, HDL, LDLCALC, TRIG, CHOLHDL, LDLDIRECT in the last 72 hours. Thyroid  Function Tests: No results for input(s): TSH,  T4TOTAL, FREET4, T3FREE, THYROIDAB in the last 72 hours. Anemia Panel: No results for input(s): VITAMINB12, FOLATE, FERRITIN, TIBC, IRON , RETICCTPCT in the last 72 hours. Sepsis Labs: No results for input(s): PROCALCITON, LATICACIDVEN in the last 168 hours.  No results found for this or any previous visit (from the past 240 hours).   Radiology Studies: No results found.  Scheduled Meds:  budesonide -glycopyrrolate -formoterol   2 puff Inhalation BID   Chlorhexidine  Gluconate Cloth  6 each Topical Daily   feeding supplement  237 mL Oral BID BM   Gerhardt's butt cream   Topical BID   multivitamin with minerals  1 tablet Oral Daily   mouth rinse  15 mL Mouth Rinse TID   pantoprazole  (PROTONIX ) IV  40 mg Intravenous Q12H   sucralfate   1 g Oral TID WC & HS   Continuous Infusions:  heparin  1,950 Units/hr (02/07/24 1245)    LOS: 21 days   Alejandro Marker, DO [facility] Available via Epic secure chat 7am-7pm After these hours, please refer to coverage provider listed on [email or web address] 02/07/2024, 7:20 PM  "

## 2024-02-07 NOTE — Progress Notes (Addendum)
 Physical Therapy Treatment Patient Details Name: Raymond Hurst MRN: [id number] DOB: 1950-09-26 Today's Date: 02/07/2024   History of Present Illness Pt is 74 yo initially presenting to [facility] then transferring to [facility] on 12/16 due to dizziness hematemesis. 12/17 pt with continuing ongoing blood loss anemia without recurrent hematemesis and was given 2 U PRBC. EGD with findings of duodenal active bleed which was clipped. Pt was intubated and admitted to ICU on 12/17 then transferred to [facility]. 12/19 ex lap abdominal wall closure with some bleeding from liver capsule controlled with cautery.  PMH includes afib, gout, GI bleed, stage IV lung cancer, alcohol abuse.    PT Comments  Pt admitted with above diagnosis. Pt was able to continue to progress gait although he couldn't walk as far today due to fatigue and need for O2 -1LO2.  Will continue to progress pt as able.  Slow progress overall. Pt met 2/6 goals due to slower progress than anticipated and goals revised.   Pt currently with functional limitations due to the deficits listed below (see PT Problem List). Pt will benefit from acute skilled PT to increase their independence and safety with mobility to allow discharge.       If plan is discharge home, recommend the following: Assist for transportation;Assistance with cooking/housework   Can travel by private vehicle     No  Equipment Recommendations  BSC/3in1;Hospital bed    Recommendations for Other Services       Precautions / Restrictions Precautions Precautions: Fall Recall of Precautions/Restrictions: Intact Restrictions Weight Bearing Restrictions Per Provider Order: No     Mobility  Bed Mobility Overal bed mobility: Needs Assistance Bed Mobility: Supine to Sit, Sit to Supine       Sit to supine: Modified independent (Device/Increase time)   General bed mobility comments: Pt in chair on arrival. Pt didnt need help to get into bed at end of treatment.    Transfers Overall  transfer level: Needs assistance Equipment used: Rolling walker (2 wheels) Transfers: Sit to/from Stand Sit to Stand: Min assist           General transfer comment: min assist to rise from chair with use of RW, cues for hand placement    Ambulation/Gait Ambulation/Gait assistance: Contact guard assist Gait Distance (Feet): 100 Feet Assistive device: Rolling walker (2 wheels) Gait Pattern/deviations: Step-through pattern, Decreased stance time - left, Drifts right/left, Trunk flexed, Wide base of support, Decreased weight shift to left Gait velocity: decreased     General Gait Details: Pt with improvement in gait today, decreased step length, mild drift to the R with mild trunk flexion; can correct briefly with cues; increased BOS with decreased stance time on the L with cues to sequence steps and RW. Fatigues quickly and needing O2 today as pt was on .5LO2 on arrival.  Sats during session >92% with pt DOE 2/4 and requesting at least 1LO2 with activity.   Stairs             Wheelchair Mobility     Tilt Bed    Modified Rankin (Stroke Patients Only)       Balance Overall balance assessment: Needs assistance Sitting-balance support: No upper extremity supported, Feet supported Sitting balance-Leahy Scale: Good     Standing balance support: Reliant on assistive device for balance Standing balance-Leahy Scale: Poor Standing balance comment: reliant on BUE support  Communication Communication Communication: No apparent difficulties Factors Affecting Communication: Hearing impaired  Cognition Arousal: Alert Behavior During Therapy: WFL for tasks assessed/performed   PT - Cognitive impairments: No family/caregiver present to determine baseline, Safety/Judgement, Sequencing                         Following commands: Intact Following commands impaired: Follows multi-step commands with increased time    Cueing  Cueing Techniques: Verbal cues  Exercises General Exercises - Lower Extremity Ankle Circles/Pumps: AROM, Both, 10 reps, Seated Long Arc Quad: AROM, Both, 10 reps, Seated Straight Leg Raises: AROM, Both, 5 reps, Supine Hip Flexion/Marching: AROM, Both, 10 reps, Seated    General Comments General comments (skin integrity, edema, etc.): HR 116 bpm      Pertinent Vitals/Pain Pain Assessment Pain Assessment: No/denies pain    Home Living                          Prior Function            PT Goals (current goals can now be found in the care plan section) Acute Rehab PT Goals Patient Stated Goal: to improve mobiltiy PT Goal Formulation: With patient Time For Goal Achievement: 02/21/24 Potential to Achieve Goals: Good Progress towards PT goals: Progressing toward goals    Frequency    Min 2X/week      PT Plan      Co-evaluation              AM-PAC PT 6 Clicks Mobility   Outcome Measure  Help needed turning from your back to your side while in a flat bed without using bedrails?: A Little Help needed moving from lying on your back to sitting on the side of a flat bed without using bedrails?: A Little Help needed moving to and from a bed to a chair (including a wheelchair)?: A Little Help needed standing up from a chair using your arms (e.g., wheelchair or bedside chair)?: A Little Help needed to walk in hospital room?: A Little Help needed climbing 3-5 steps with a railing? : Total 6 Click Score: 16    End of Session Equipment Utilized During Treatment: Gait belt Activity Tolerance: Patient tolerated treatment well Patient left: with call bell/phone within reach;in bed;with bed alarm set Nurse Communication: Mobility status PT Visit Diagnosis: Unsteadiness on feet (R26.81);Other abnormalities of gait and mobility (R26.89);Muscle weakness (generalized) (M62.81)     Time: 9086-9064 PT Time Calculation (min) (ACUTE ONLY): 22 min  Charges:     $Gait Training: 8-22 mins PT General Charges $$ ACUTE PT VISIT: 1 Visit                     Raymond Hurst M,PT Acute Rehab Services [phone]    Raymond Hurst 02/07/2024, 2:10 PM

## 2024-02-07 NOTE — Plan of Care (Signed)
" °  Problem: Health Behavior/Discharge Planning: Goal: Ability to manage health-related needs will improve Outcome: Progressing   Problem: Clinical Measurements: Goal: Ability to maintain clinical measurements within normal limits will improve Outcome: Progressing Goal: Will remain free from infection Outcome: Progressing Goal: Diagnostic test results will improve Outcome: Progressing Goal: Respiratory complications will improve Outcome: Progressing Goal: Cardiovascular complication will be avoided Outcome: Progressing   Problem: Activity: Goal: Risk for activity intolerance will decrease Outcome: Progressing   Problem: Nutrition: Goal: Adequate nutrition will be maintained Outcome: Progressing   Problem: Coping: Goal: Level of anxiety will decrease Outcome: Progressing   Problem: Elimination: Goal: Will not experience complications related to bowel motility Outcome: Progressing Goal: Will not experience complications related to urinary retention Outcome: Progressing   Problem: Pain Managment: Goal: General experience of comfort will improve and/or be controlled Outcome: Progressing   Problem: Safety: Goal: Ability to remain free from injury will improve Outcome: Progressing   Problem: Skin Integrity: Goal: Risk for impaired skin integrity will decrease Outcome: Progressing   Problem: Education: Goal: Ability to describe self-care measures that may prevent or decrease complications (Diabetes Survival Skills Education) will improve Outcome: Progressing Goal: Individualized Educational Video(s) Outcome: Progressing   Problem: Coping: Goal: Ability to adjust to condition or change in health will improve Outcome: Progressing   Problem: Fluid Volume: Goal: Ability to maintain a balanced intake and output will improve Outcome: Progressing   Problem: Health Behavior/Discharge Planning: Goal: Ability to identify and utilize available resources and services will  improve Outcome: Progressing Goal: Ability to manage health-related needs will improve Outcome: Progressing   Problem: Metabolic: Goal: Ability to maintain appropriate glucose levels will improve Outcome: Progressing   Problem: Nutritional: Goal: Maintenance of adequate nutrition will improve Outcome: Progressing Goal: Progress toward achieving an optimal weight will improve Outcome: Progressing   Problem: Skin Integrity: Goal: Risk for impaired skin integrity will decrease Outcome: Progressing   Problem: Tissue Perfusion: Goal: Adequacy of tissue perfusion will improve Outcome: Progressing   Problem: Education: Goal: Knowledge of the prescribed therapeutic regimen will improve Outcome: Progressing   Problem: Bowel/Gastric: Goal: Gastrointestinal status for postoperative course will improve Outcome: Progressing   Problem: Cardiac: Goal: Ability to maintain an adequate cardiac output Outcome: Progressing Goal: Will show no evidence of cardiac arrhythmias Outcome: Progressing   Problem: Nutritional: Goal: Will attain and maintain optimal nutritional status Outcome: Progressing   Problem: Neurological: Goal: Will regain or maintain usual level of consciousness Outcome: Progressing   Problem: Clinical Measurements: Goal: Ability to maintain clinical measurements within normal limits Outcome: Progressing Goal: Postoperative complications will be avoided or minimized Outcome: Progressing   "

## 2024-02-07 NOTE — TOC Progression Note (Signed)
 Transition of Care [facility]) - Progression Note    Patient Details  Name: Raymond Hurst MRN: [id number] Date of Birth: 21-Dec-1950  [facility] CM/SW Contact  Lendia Dais, [place] Phone Number: 02/07/2024, 1:45 PM  Clinical Narrative: Pt is not yet medically stable. Pt has bloody stools and pending clearance from GI and gen surg.  CSW informed [place] [facility] [place].  CSW will continue to follow.         Barriers to Discharge: Continued Medical Work up               Expected Discharge Plan and Services                                               Social Drivers of Health (SDOH) Interventions SDOH Screenings   Food Insecurity: No Food Insecurity (01/16/2024)  Housing: Low Risk (01/26/2024)  Transportation Needs: No Transportation Needs (01/16/2024)  Utilities: Not At Risk (01/16/2024)  Alcohol Screen: Low Risk (06/15/2023)  Depression (PHQ2-9): Low Risk (01/12/2024)  Financial Resource Strain: High Risk (06/15/2023)  Physical Activity: Inactive (06/15/2023)  Social Connections: Moderately Isolated (01/16/2024)  Stress: No Stress Concern Present (06/15/2023)  Tobacco Use: Medium Risk (01/19/2024)    Readmission Risk Interventions    09/08/2023   12:09 PM  Readmission Risk Prevention Plan  Transportation Screening Complete  HRI or Home Care Consult Complete  Social Work Consult for Recovery Care Planning/Counseling Complete  Palliative Care Screening Not Applicable  Medication Review Oceanographer) Complete

## 2024-02-07 NOTE — Progress Notes (Signed)
 PHARMACY - ANTICOAGULATION CONSULT NOTE  Pharmacy Consult for heparin   Indication: atrial fibrillation and chronic RUE DVT  Allergies[1]  Patient Measurements: Height: 6' 0.01 (182.9 cm) Weight: (!) 136.7 kg (301 lb 5.9 oz) IBW/kg (Calculated) : 77.62 HEPARIN  DW (KG): 103.8  Vital Signs: Temp: 98.2 F (36.8 C) (01/07 0854) BP: 145/117 (01/07 0854) Pulse Rate: 106 (01/07 0854)  Labs: Recent Labs    02/05/24 0653 02/05/24 1414 02/06/24 0350 02/06/24 0836 02/06/24 2117 02/07/24 0958  HGB 15.3   < > 14.5 14.9 14.0 13.7  HCT 46.2   < > 43.0 44.4 43.1 41.0  PLT  --   --  183  --   --  196  APTT  --   --   --   --  96*  --   HEPARINUNFRC  --    < > 0.78*  --  0.58 0.53  CREATININE 1.37*  --  1.31*  --   --  1.37*   < > = values in this interval not displayed.    Estimated Creatinine Clearance: 70.8 mL/min (A) (by C-G formula based on SCr of 1.37 mg/dL (H)).   Assessment: 22 YOM w/ Hx AF with an RUE DVT in need of AC. Apixaban  PTA. Last dose was >7 day ago. He was admitted to [facility] w/  duodenal ulcer hemorrhage which was clipped on 12/17.   Resuming heparin  s/p EGD this AM, to monitor on heparin  for a few days before/if resuming apixaban   Heparin  level 0.53 this PM  Goal of Therapy:  Heparin  level 0.3-0.5 units/mL Monitor platelets by anticoagulation protocol: Yes   Plan:  Continue heparin  at current rate. Per GI, consider resume Eliquis  1/7.  Thank you. Olam Monte, PharmD 02/07/2024 1:45 PM       [1] No Known Allergies

## 2024-02-07 NOTE — Progress Notes (Signed)
 0930 was informed by tech that pt. Had large mahagony colored stool, 1015 Primary MD and surgery aware, ok to cont. Heparin  gtt per surgery, Also GI made aware  Doyal Sias

## 2024-02-08 DIAGNOSIS — I82621 Acute embolism and thrombosis of deep veins of right upper extremity: Secondary | ICD-10-CM | POA: Diagnosis not present

## 2024-02-08 DIAGNOSIS — K5710 Diverticulosis of small intestine without perforation or abscess without bleeding: Secondary | ICD-10-CM | POA: Diagnosis not present

## 2024-02-08 DIAGNOSIS — K3189 Other diseases of stomach and duodenum: Secondary | ICD-10-CM | POA: Diagnosis not present

## 2024-02-08 DIAGNOSIS — K921 Melena: Secondary | ICD-10-CM | POA: Diagnosis not present

## 2024-02-08 DIAGNOSIS — T183XXA Foreign body in small intestine, initial encounter: Secondary | ICD-10-CM | POA: Diagnosis not present

## 2024-02-08 DIAGNOSIS — I4891 Unspecified atrial fibrillation: Secondary | ICD-10-CM | POA: Diagnosis not present

## 2024-02-08 DIAGNOSIS — I82629 Acute embolism and thrombosis of deep veins of unspecified upper extremity: Secondary | ICD-10-CM | POA: Diagnosis not present

## 2024-02-08 DIAGNOSIS — Z9189 Other specified personal risk factors, not elsewhere classified: Secondary | ICD-10-CM | POA: Diagnosis not present

## 2024-02-08 DIAGNOSIS — K264 Chronic or unspecified duodenal ulcer with hemorrhage: Secondary | ICD-10-CM | POA: Diagnosis not present

## 2024-02-08 DIAGNOSIS — I482 Chronic atrial fibrillation, unspecified: Secondary | ICD-10-CM | POA: Diagnosis not present

## 2024-02-08 LAB — HEMOGLOBIN AND HEMATOCRIT, BLOOD
HCT: 36.5 % — ABNORMAL LOW (ref 39.0–52.0)
Hemoglobin: 12.3 g/dL — ABNORMAL LOW (ref 13.0–17.0)

## 2024-02-08 LAB — COMPREHENSIVE METABOLIC PANEL WITH GFR
ALT: 25 U/L (ref 0–44)
AST: 19 U/L (ref 15–41)
Albumin: 3.3 g/dL — ABNORMAL LOW (ref 3.5–5.0)
Alkaline Phosphatase: 75 U/L (ref 38–126)
Anion gap: 10 (ref 5–15)
BUN: 40 mg/dL — ABNORMAL HIGH (ref 8–23)
CO2: 24 mmol/L (ref 22–32)
Calcium: 9.2 mg/dL (ref 8.9–10.3)
Chloride: 101 mmol/L (ref 98–111)
Creatinine, Ser: 1.37 mg/dL — ABNORMAL HIGH (ref 0.61–1.24)
GFR, Estimated: 55 mL/min — ABNORMAL LOW
Glucose, Bld: 94 mg/dL (ref 70–99)
Potassium: 3.8 mmol/L (ref 3.5–5.1)
Sodium: 135 mmol/L (ref 135–145)
Total Bilirubin: 0.5 mg/dL (ref 0.0–1.2)
Total Protein: 6.5 g/dL (ref 6.5–8.1)

## 2024-02-08 LAB — CBC WITH DIFFERENTIAL/PLATELET
Abs Immature Granulocytes: 0.03 K/uL (ref 0.00–0.07)
Basophils Absolute: 0.1 K/uL (ref 0.0–0.1)
Basophils Relative: 1 %
Eosinophils Absolute: 0.2 K/uL (ref 0.0–0.5)
Eosinophils Relative: 3 %
HCT: 37.7 % — ABNORMAL LOW (ref 39.0–52.0)
Hemoglobin: 12.8 g/dL — ABNORMAL LOW (ref 13.0–17.0)
Immature Granulocytes: 1 %
Lymphocytes Relative: 10 %
Lymphs Abs: 0.6 K/uL — ABNORMAL LOW (ref 0.7–4.0)
MCH: 30 pg (ref 26.0–34.0)
MCHC: 34 g/dL (ref 30.0–36.0)
MCV: 88.5 fL (ref 80.0–100.0)
Monocytes Absolute: 0.5 K/uL (ref 0.1–1.0)
Monocytes Relative: 8 %
Neutro Abs: 4.6 K/uL (ref 1.7–7.7)
Neutrophils Relative %: 77 %
Platelets: 181 K/uL (ref 150–400)
RBC: 4.26 MIL/uL (ref 4.22–5.81)
RDW: 15.2 % (ref 11.5–15.5)
WBC: 5.9 K/uL (ref 4.0–10.5)
nRBC: 0 % (ref 0.0–0.2)

## 2024-02-08 LAB — PHOSPHORUS: Phosphorus: 3.2 mg/dL (ref 2.5–4.6)

## 2024-02-08 LAB — HEPARIN LEVEL (UNFRACTIONATED)
Heparin Unfractionated: <0.1 [IU]/mL — ABNORMAL LOW (ref 0.30–0.70)
Heparin Unfractionated: 0.41 [IU]/mL (ref 0.30–0.70)

## 2024-02-08 LAB — MAGNESIUM: Magnesium: 2.2 mg/dL (ref 1.7–2.4)

## 2024-02-08 NOTE — Progress Notes (Addendum)
 Occupational Therapy Treatment Patient Details Name: Raymond Hurst MRN: [id number] DOB: 08/31/1950 Today's Date: 02/08/2024   History of present illness Pt is 74 yo initially presenting to [facility] then transferring to [facility] on 12/16 due to dizziness hematemesis. 12/17 pt with continuing ongoing blood loss anemia without recurrent hematemesis and was given 2 U PRBC. EGD with findings of duodenal active bleed which was clipped. Pt was intubated and admitted to ICU on 12/17 then transferred to [facility]. 12/19 ex lap abdominal wall closure with some bleeding from liver capsule controlled with cautery.  PMH includes afib, gout, GI bleed, stage IV lung cancer, alcohol abuse.   OT comments  Patient tolerating increased level of activity with improving activity tolerance.  Generalized supervision with in room mobility/toileting and stand grooming task.  Still extra time and rest breaks, especially with lower body ADL, up to Mod A from a sit to stand level.  OT to continue efforts in the acute setting to address deficits, and Patient will benefit from continued inpatient follow up therapy, <3 hours/day to maximize his functional independence prior to returning home.        If plan is discharge home, recommend the following:  A lot of help with walking and/or transfers;Two people to help with bathing/dressing/bathroom;Assistance with cooking/housework;Direct supervision/assist for medications management;Assist for transportation;Help with stairs or ramp for entrance   Equipment Recommendations  BSC/3in1;Wheelchair cushion (measurements OT);Tub/shower bench    Recommendations for Other Services      Precautions / Restrictions Precautions Precautions: Fall Recall of Precautions/Restrictions: Intact Restrictions Weight Bearing Restrictions Per Provider Order: No       Mobility Bed Mobility Overal bed mobility: Needs Assistance Bed Mobility: Supine to Sit, Sit to Supine     Supine to sit: Modified  independent (Device/Increase time) Sit to supine: Supervision        Transfers Overall transfer level: Needs assistance Equipment used: Rolling walker (2 wheels) Transfers: Sit to/from Stand Sit to Stand: Supervision, Contact guard assist Stand pivot transfers: Supervision               Balance Overall balance assessment: Needs assistance Sitting-balance support: Feet supported Sitting balance-Leahy Scale: Good     Standing balance support: Reliant on assistive device for balance Standing balance-Leahy Scale: Fair                             ADL either performed or assessed with clinical judgement   ADL       Grooming: Supervision/safety;Standing                   Toilet Transfer: Retail Banker;Ambulation;Rolling walker (2 wheels)   Toileting- Clothing Manipulation and Hygiene: Supervision/safety;Sitting/lateral lean              Extremity/Trunk Assessment Upper Extremity Assessment Upper Extremity Assessment: Generalized weakness RUE Coordination: decreased fine motor LUE Coordination: decreased fine motor   Lower Extremity Assessment Lower Extremity Assessment: Defer to PT evaluation   Cervical / Trunk Assessment Cervical / Trunk Assessment: Normal    Vision Baseline Vision/History: 1 Wears glasses Patient Visual Report: No change from baseline     Perception Perception Perception: Not tested   Praxis Praxis Praxis: Not tested   Communication Communication Communication: No apparent difficulties Factors Affecting Communication: Hearing impaired   Cognition Arousal: Alert Behavior During Therapy: WFL for tasks assessed/performed Cognition: No apparent impairments  Following commands: Intact Following commands impaired: Follows multi-step commands with increased time      Cueing   Cueing Techniques: Verbal cues  Exercises      Shoulder Instructions        General Comments  VSS    Pertinent Vitals/ Pain       Pain Assessment Pain Assessment: No/denies pain Pain Intervention(s): Monitored during session                                                          Frequency  Min 2X/week        Progress Toward Goals  OT Goals(current goals can now be found in the care plan section)  Progress towards OT goals: Progressing toward goals  Acute Rehab OT Goals OT Goal Formulation: With patient Time For Goal Achievement: 02/23/24 Potential to Achieve Goals: Good  Plan      Co-evaluation                 AM-PAC OT 6 Clicks Daily Activity     Outcome Measure   Help from another person eating meals?: None Help from another person taking care of personal grooming?: A Little Help from another person toileting, which includes using toliet, bedpan, or urinal?: A Little Help from another person bathing (including washing, rinsing, drying)?: A Lot Help from another person to put on and taking off regular upper body clothing?: A Little Help from another person to put on and taking off regular lower body clothing?: A Lot 6 Click Score: 17    End of Session Equipment Utilized During Treatment: Rolling walker (2 wheels)  OT Visit Diagnosis: Muscle weakness (generalized) (M62.81)   Activity Tolerance Patient tolerated treatment well   Patient Left in bed;with call bell/phone within reach;with family/visitor present   Nurse Communication Mobility status        Time: 8563-8498 OT Time Calculation (min): 25 min  Charges: OT General Charges $OT Visit: 1 Visit OT Treatments $Self Care/Home Management : 8-22 mins $Therapeutic Activity: 8-22 mins  02/08/2024  RP, OTR/L  Acute Rehabilitation Services  Office:  [phone]   Raymond Hurst 02/08/2024, 3:05 PM

## 2024-02-08 NOTE — Progress Notes (Signed)
 PHARMACY - ANTICOAGULATION CONSULT NOTE  Pharmacy Consult for heparin   Indication: atrial fibrillation and chronic RUE DVT  Allergies[1]  Patient Measurements: Height: 6' 0.01 (182.9 cm) Weight: (!) 136.7 kg (301 lb 5.9 oz) IBW/kg (Calculated) : 77.62 HEPARIN  DW (KG): 103.8  Vital Signs: Temp: 97.7 F (36.5 C) (01/08 1215) Temp Source: Oral (01/08 1215) BP: 125/89 (01/08 1215) Pulse Rate: 83 (01/08 1215)  Labs: Recent Labs    02/06/24 0350 02/06/24 0836 02/06/24 2117 02/07/24 0958 02/08/24 0441 02/08/24 1057  HGB 14.5   < > 14.0 13.7 12.8*  --   HCT 43.0   < > 43.1 41.0 37.7*  --   PLT 183  --   --  196 181  --   APTT  --   --  96*  --   --   --   HEPARINUNFRC 0.78*  --  0.58 0.53  --  <0.10*  CREATININE 1.31*  --   --  1.37* 1.37*  --    < > = values in this interval not displayed.    Estimated Creatinine Clearance: 70.8 mL/min (A) (by C-G formula based on SCr of 1.37 mg/dL (H)).   Assessment: 83 YOM w/ Hx AF with an RUE DVT in need of AC. Apixaban  PTA. Last dose was >7 day ago. He was admitted to [facility] w/  duodenal ulcer hemorrhage which was clipped on 12/17.   Resuming heparin  s/p EGD this AM, to monitor on heparin  for a few days before/if resuming apixaban   Heparin  level < 0.10, HgB down to 12.8  Goal of Therapy:  Heparin  level 0.3-0.5 units/mL Monitor platelets by anticoagulation protocol: Yes   Plan:  Increase heparin  to 2100 units / hr Heparin  level in 6 hours  Thank you. Olam Monte, PharmD 02/08/2024 12:57 PM        [1] No Known Allergies

## 2024-02-08 NOTE — Progress Notes (Signed)
 " PROGRESS NOTE    Raymond Hurst  FMW:[id number] DOB: May 15, 1950 DOA: 01/16/2024 PCP: [name], MD   Brief Narrative:  74 year old male Left lung mass bronchogenic CA (poorly differentiated squamous cell) diagnosed 06/05/2023-bronchoscopy 08/09/2023 complicated by left PNA thorax-follows with Dr. Arlester carboplatin  and Taxol --currently durvalumab  Permanent A-fib CHA2DS2-VASc >4 on Eliquis  Previous heavy EtOH UGIB 09/09/2023 cratered ulcer duodenum status post mesenteric angio/embolization @ [facility] 12/6-12/8 readmission--with upper GI bleed clean-based ulcer told to continue twice daily PPI manage resumed Eliquis  hemoglobin was 9.6 on discharge  Significant Events: 12/16 [facility] ED eval dizziness nausea-couple of bright red vomit-->?  ICU [facility]--> [address] [place] transfer 2/2 12/17 ICU [facility]-->  [place] [facility] transfer 2/2--IR duodenal bleed Clip ->?  Continued ongoing bleeding requiring pressors--- massive transfusion protocol-----emergent ex lap Dr. Rubin with duodenotomy ligation GDA branches VAC placement 12/19 Secondary closure of abdomen Dr. Ebbie  12/26 barium swallow performed and upper GI 1226 without evidence of leak.  He has been intermittently bleeding and had a bowel movement that was hematochezia and CT angio abdomen pelvis on 1 3 showed no evidence of active GI bleeding.  DOAC remains being held but he has been resumed on a heparin  drip and having intermittently bloody bowel movements and had a large mahogany colored stool.  Continue heparin  drip but will need to continue to monitor given that blood can continues to drop and may need a colonoscopy for further evaluation  Assessment and Plan:  Duodenal ulcer hemorrhage with coiling 09/2023  s/p laparotomy 12/17, secondary closure 12/19 as above -Vomiting resolved since 12/26.  Off TPN on 12/31.  JP drain removed 12/31 -Tolerating mechanical soft diet.  Back on IV PPI BID -PT/OT recommending SNF when medically  stable   Recurrent GI Bleed: Had multiple episodes of bloody and melanotic stools on 1/3 and 1/4 (images in media) - Vital signs remain stable - Hgb remains stable but continues to have Bloody Bowel movements. Hgb/Hct Trend:  Recent Labs  Lab 02/05/24 1414 02/05/24 1956 02/06/24 0350 02/06/24 0836 02/06/24 2117 02/07/24 0958 02/08/24 0441  HGB 14.2 14.4 14.5 14.9 14.0 13.7 12.8*  HCT 43.2 42.8 43.0 44.4 43.1 41.0 37.7*  MCV  --   --  89.6  --   --  88.0 88.5  - General surgery re-engated, following; hemoglobin remained stable so with GI and general surgery and feel that is okay to continue heparin  from their standpoint - CTA GIB negative for active bleeding - GI following and considering resuming anticoagulation with Eliquis  in the a.m. or waiting a few days. - EGD (1/5) showed no active bleeding or high risk bleeding lesions and the bleeding was suspected to be from friable mucosa in the duodenal bulb at the site of surgical repair after DOAC started -Continue heparin  drip  -Continue holding Eliquis , GI now recommending holding off initiating oral anticoagulation while the patient continues to have bloody stools.  Recommending changing to Q6 H&H's to every 12; -H&H now slowly dropping and GI feels that he continues to bleed that the next step should be a colonoscopy to definitively exclude a lower GI source - Continuing IV protonix  BID   Sepsis secondary to above  Staph epi bacteremia-MRSE with resistances Likely peritonitis - Bcx 12/21 postive for staph epi - Bcx 12/26 no growth - Formally consulted ID on 12/30  - Pt has completed course of antibiotics as of 12/30  - RIJ CVC removed 1/2 -WBC Trend:  Recent Labs  Lab 02/01/24 0047 02/02/24 0326 02/03/24  1129 02/04/24 0527 02/06/24 0350 02/07/24 0958 02/08/24 0441  WBC 7.6 7.4 7.7 8.6 6.3 6.9 5.9   AKI on admission Metabolic Acidosis - Baseline Cr 0.7 - 0.9. Most recently has been consistent 1.31-1.34. Current BUN/Cr  Trend: Recent Labs  Lab 02/02/24 0326 02/03/24 1225 02/04/24 0527 02/05/24 0653 02/06/24 0350 02/07/24 0958 02/08/24 0441  BUN 33* 36* 33* 33* 34* 38* 40*  CREATININE 1.32* 1.43* 1.46* 1.37* 1.31* 1.37* 1.37*  -Had a Slight Metabolic Acidosis and now improved. CO2 is 24, AG is 10, and Chloride Level is 101 -Avoid Nephrotoxic Medications, Contrast Dyes, Hypotension and Dehydration to Ensure Adequate Renal Perfusion and will need to Renally Adjust Meds -Continue to Monitor and Trend Renal Function carefully and repeat CMP in the AM   Atrial Fibrillation  -CHADVASC >4 -Patient's family discussed with Cardiology on 1/2, agreed to continue Eliquis . Please see cardiology consult note from 02/02/2024.  - Eliquis  was again stopped on 1/3 given episode of bloody stools/melena and continuing Heparin  gtt for now and consideration of resumption in the AM    Right cephalic vein acute SVT Right brachial vein acute DVT -Noted on RUE doppler on 12/29 -Apixaban  held as above but Heparin  being continued    Hypernatremia: Thought to be secondary to free water  deficit. Na+ Trend:  Recent Labs  Lab 02/02/24 0326 02/03/24 1225 02/04/24 0527 02/05/24 0653 02/06/24 0350 02/07/24 0958 02/08/24 0441  NA 137 135 137 138 135 135 135  -Resolved. CTM and Trend and repeat CMP in the AM    Hypokalemia: Resolved. K+ Trend:  Recent Labs  Lab 02/02/24 0326 02/03/24 1225 02/04/24 0527 02/05/24 0653 02/06/24 0350 02/07/24 0958 02/08/24 0441  K 4.0 4.5 4.2 4.4 4.0 4.3 3.8  -CTM and Replete as Necessary. Repeat CMP in the AM    Urinary Retention - Foley was initially removed and patient was voiding well. However, noted to be retaining again today.  - Indwelling cath placed and will be kept at discharge. Will need outpt f/u with Urology   Left lung mass bronchogenic CA -Holding immunotherapy/chemotherapy -Will need Outpatient follow-up   Critical illness thrombocytopenia - resolved; Platelet Count  is now 181  Hypoalbuminemia: Patient's Albumin Trend: Recent Labs  Lab 01/22/24 0320 01/25/24 0435 01/27/24 0316 01/28/24 0355 01/29/24 0330 02/07/24 0958 02/08/24 0441  ALBUMIN 2.7* 2.6* 2.6* 2.9* 3.0* 3.3* 3.3*  -Continue to Monitor and Trend and repeat CMP in the AM  Class III (Morbid) Obesity: Complicates overall prognosis and care. Estimated body mass index is 40.86 kg/m as calculated from the following:   Height as of this encounter: 6' 0.01 (1.829 m).   Weight as of this encounter: 136.7 kg. Weight Loss and Dietary Counseling given   DVT prophylaxis: SCDs Start: 01/16/24 1731    Code Status: Full Code Family Communication: Talked with Wife over the Telephone  Disposition Plan:  Level of care: Telemetry Status is: Inpatient Remains inpatient appropriate because: Needs further clinical improvement and to ensure he is no longer bleeding prior to going to SNF   Consultants:  Gastroenterology General Surgery  Procedures:  As delineated as above   Antimicrobials:  Anti-infectives (From admission, onward)    Start     Dose/Rate Route Frequency Ordered Stop   01/29/24 2200  fluconazole  (DIFLUCAN ) IVPB 400 mg  Status:  Discontinued        400 mg 100 mL/hr over 120 Minutes Intravenous Every 24 hours 01/29/24 1336 01/29/24 1346   01/29/24 2200  fluconazole  (DIFLUCAN ) IVPB 400  mg  Status:  Discontinued        400 mg 200 mL/hr over 60 Minutes Intravenous Every 24 hours 01/29/24 1346 01/30/24 1533   01/25/24 1500  vancomycin  (VANCOREADY) IVPB 1500 mg/300 mL  Status:  Discontinued        1,500 mg 150 mL/hr over 120 Minutes Intravenous Every 24 hours 01/24/24 1322 01/30/24 1233   01/24/24 1115  vancomycin  (VANCOCIN ) 2,500 mg in sodium chloride  0.9 % 500 mL IVPB        2,500 mg 262.5 mL/hr over 120 Minutes Intravenous  Once 01/24/24 1029 01/24/24 1530   01/17/24 2245  piperacillin -tazobactam (ZOSYN ) IVPB 3.375 g  Status:  Discontinued        3.375 g 12.5 mL/hr over 240  Minutes Intravenous Every 8 hours 01/17/24 2149 01/30/24 1233   01/17/24 2200  fluconazole  (DIFLUCAN ) IVPB 400 mg  Status:  Discontinued        400 mg 100 mL/hr over 120 Minutes Intravenous Every 24 hours 01/17/24 2059 01/29/24 1336       Subjective: Seen and examined at bedside sitting in the chair and he was feeling fatigued and weak.  Continues to have bloody bowel movements and had one this morning.  No nausea or vomiting.  Denies lightheadedness or dizziness.  No other concerns or complaints  Objective: Vitals:   02/07/24 1745 02/07/24 2300 02/08/24 0813 02/08/24 1215  BP: 115/73 125/68 115/79 125/89  Pulse: 93 87 87 83  Resp: 19  16   Temp: 98.4 F (36.9 C) (!) 97.5 F (36.4 C)  97.7 F (36.5 C)  TempSrc:  Oral  Oral  SpO2: 98% 96% 95% 95%  Weight:      Height:        Intake/Output Summary (Last 24 hours) at 02/08/2024 1643 Last data filed at 02/08/2024 1323 Gross per 24 hour  Intake 720 ml  Output 2350 ml  Net -1630 ml   Filed Weights   01/22/24 0232 01/25/24 0452 01/29/24 0406  Weight: 135.7 kg 127.2 kg (!) 136.7 kg   Examination: Physical Exam:  Constitutional: WN/WD obese chronically ill-appearing Caucasian male who appears fatigued and sitting in the chair at bedside Respiratory: Diminished to auscultation bilaterally with some coarse breath sounds, no wheezing, rales, rhonchi or crackles. Normal respiratory effort and patient is not tachypenic. No accessory muscle use.  Unlabored breathing Cardiovascular: RRR, no murmurs / rubs / gallops. S1 and S2 auscultated.  Mild extremity edema Abdomen: Soft, non-tender, distended secondary to body habitus.. Bowel sounds positive.  GU: Deferred. Musculoskeletal: No clubbing / cyanosis of digits/nails. No joint deformity upper and lower extremities.  Skin: No rashes, lesions, ulcers on a limited skin evaluation. No induration; Warm and dry.  Neurologic: CN 2-12 grossly intact with no focal deficits. Romberg sign cerebellar  reflexes not assessed.  Psychiatric: Normal judgment and insight.  Awake and alert but appears calm and fatigued  Data Reviewed: I have personally reviewed following labs and imaging studies  CBC: Recent Labs  Lab 02/03/24 1129 02/04/24 0527 02/04/24 1616 02/06/24 0350 02/06/24 0836 02/06/24 2117 02/07/24 0958 02/08/24 0441  WBC 7.7 8.6  --  6.3  --   --  6.9 5.9  NEUTROABS  --   --   --   --   --   --  5.7 4.6  HGB 16.5 15.3   < > 14.5 14.9 14.0 13.7 12.8*  HCT 49.8 46.5   < > 43.0 44.4 43.1 41.0 37.7*  MCV 90.5 90.3  --  89.6  --   --  88.0 88.5  PLT 211 197  --  183  --   --  196 181   < > = values in this interval not displayed.   Basic Metabolic Panel: Recent Labs  Lab 02/04/24 0527 02/05/24 0653 02/06/24 0350 02/07/24 0958 02/08/24 0441  NA 137 138 135 135 135  K 4.2 4.4 4.0 4.3 3.8  CL 102 102 100 101 101  CO2 23 26 23  21* 24  GLUCOSE 99 104* 100* 136* 94  BUN 33* 33* 34* 38* 40*  CREATININE 1.46* 1.37* 1.31* 1.37* 1.37*  CALCIUM  9.0 9.2 8.9 9.0 9.2  MG  --   --   --  2.1 2.2  PHOS  --   --   --  3.4 3.2   GFR: Estimated Creatinine Clearance: 70.8 mL/min (A) (by C-G formula based on SCr of 1.37 mg/dL (H)). Liver Function Tests: Recent Labs  Lab 02/07/24 0958 02/08/24 0441  AST 20 19  ALT 25 25  ALKPHOS 80 75  BILITOT 0.5 0.5  PROT 6.6 6.5  ALBUMIN 3.3* 3.3*   No results for input(s): LIPASE, AMYLASE in the last 168 hours. No results for input(s): AMMONIA in the last 168 hours. Coagulation Profile: No results for input(s): INR, PROTIME in the last 168 hours. Cardiac Enzymes: No results for input(s): CKTOTAL, CKMB, CKMBINDEX, TROPONINI in the last 168 hours. BNP (last 3 results) No results for input(s): PROBNP in the last 8760 hours. HbA1C: No results for input(s): HGBA1C in the last 72 hours. CBG: Recent Labs  Lab 02/01/24 1953  GLUCAP 110*   Lipid Profile: No results for input(s): CHOL, HDL, LDLCALC, TRIG,  CHOLHDL, LDLDIRECT in the last 72 hours. Thyroid  Function Tests: No results for input(s): TSH, T4TOTAL, FREET4, T3FREE, THYROIDAB in the last 72 hours. Anemia Panel: No results for input(s): VITAMINB12, FOLATE, FERRITIN, TIBC, IRON , RETICCTPCT in the last 72 hours. Sepsis Labs: No results for input(s): PROCALCITON, LATICACIDVEN in the last 168 hours.  No results found for this or any previous visit (from the past 240 hours).   Radiology Studies: No results found.  Scheduled Meds:  budesonide -glycopyrrolate -formoterol   2 puff Inhalation BID   Chlorhexidine  Gluconate Cloth  6 each Topical Daily   feeding supplement  237 mL Oral BID BM   Gerhardt's butt cream   Topical BID   multivitamin with minerals  1 tablet Oral Daily   mouth rinse  15 mL Mouth Rinse TID   pantoprazole  (PROTONIX ) IV  40 mg Intravenous Q12H   sucralfate   1 g Oral TID WC & HS   Continuous Infusions:  heparin  2,100 Units/hr (02/08/24 1355)    LOS: 22 days   Alejandro Marker, DO [facility] Available via Epic secure chat 7am-7pm After these hours, please refer to coverage provider listed on [email or web address] 02/08/2024, 4:43 PM  "

## 2024-02-08 NOTE — Progress Notes (Signed)
 Mobility Specialist: Progress Note   02/08/24 1546  Mobility  Activity Ambulated with assistance  Level of Assistance Standby assist, set-up cues, supervision of patient - no hands on  Assistive Device Front wheel walker  Distance Ambulated (ft) 150 ft  Activity Response Tolerated well  Mobility Referral Yes  Mobility visit 1 Mobility  Mobility Specialist Start Time (ACUTE ONLY) 1025  Mobility Specialist Stop Time (ACUTE ONLY) 1035  Mobility Specialist Time Calculation (min) (ACUTE ONLY) 10 min    Pt received in bed, agreeable to mobility session. SV throughout. SpO2 >90% on RA, stated he breathing felt a little shallow but otherwise no complaints. Returned to room. Left in chair with all needs met, call bell in reach.   Ileana Lute Mobility Specialist Please contact via SecureChat or Rehab office at [phone]

## 2024-02-08 NOTE — Progress Notes (Signed)
 [facility] ROUNDING NOTE   Subjective: Patient reports having 1 bowel movement today, which he believes was brown. Hemoglobin this morning dropped slightly to 12.8.  BUN up slightly to 40. I spoke via telephone with the patient's wife who remains very frustrated and concerned about the drop in hemoglobin and bloody stool yesterday.  I provided reassurance to her that patient has not had any bloody stools, and that the drop in hemoglobin this morning is not necessarily unexpected given the blood yesterday, but that his hemoglobin levels are still very close to normal.   Objective: Vital signs in last 24 hours: Temp:  [97.5 F (36.4 C)-97.7 F (36.5 C)] 97.6 F (36.4 C) (01/08 2041) Pulse Rate:  [80-95] 95 (01/08 2041) Resp:  [16-19] 19 (01/08 2041) BP: (112-125)/(68-89) 118/78 (01/08 2041) SpO2:  [95 %-98 %] 98 % (01/08 2041) Last BM Date : 02/07/24 General: NAD, chronically ill appearing Caucasian male    Intake/Output from previous day: 01/07 0701 - 01/08 0700 In: 400 [P.O.:400] Out: 1650 [Urine:1650] Intake/Output this shift: No intake/output data recorded.   Lab Results: Recent Labs    02/06/24 0350 02/06/24 0836 02/06/24 2117 02/07/24 0958 02/08/24 0441  WBC 6.3  --   --  6.9 5.9  HGB 14.5   < > 14.0 13.7 12.8*  PLT 183  --   --  196 181  MCV 89.6  --   --  88.0 88.5   < > = values in this interval not displayed.   BMET Recent Labs    02/06/24 0350 02/07/24 0958 02/08/24 0441  NA 135 135 135  K 4.0 4.3 3.8  CL 100 101 101  CO2 23 21* 24  GLUCOSE 100* 136* 94  BUN 34* 38* 40*  CREATININE 1.31* 1.37* 1.37*  CALCIUM  8.9 9.0 9.2   LFT Recent Labs    02/07/24 0958 02/08/24 0441  PROT 6.6 6.5  ALBUMIN 3.3* 3.3*  AST 20 19  ALT 25 25  ALKPHOS 80 75  BILITOT 0.5 0.5   PT/INR No results for input(s): INR in the last 72 hours.    Imaging/Other results: No results found.    Assessment and Plan:  74 year old male with  history of COPD, sleep apnea, stage IV lung cancer, history of A-fib, history of CVA admitted with upper GI bleed from duodenal bulb ulcer, which continued to bleed despite endoscopic therapies and IR embolization, eventually requiring ex lap, duodenotomy and ligation of the GDA and branches on January 17, 2024. He experienced recurrent bleeding January 3 after restarting Eliquis  January 2.  Eliquis  was again stopped January 3 and he underwent a repeat upper endoscopy January 5 which showed post surgical changes in the duodenal bulb characterized by suture material and friable mucosa, but no ulcer and no evidence of recurrent or active bleeding.  He was restarted on a heparin  drip January 5 after the EGD.   Duodenal bulb ulcer with hemorrhage requiring surgical hemostasis, with rebleeding after initiation of Eliquis  - EGD 02/05/2024 without high risk bleeding lesions.  Suspect patient must have rebled from friable mucosa in the setting of systemic anticoagulation - Very low suspicion for a secondary bleeding source, but if patient continues to bleed, I did offer a colonoscopy to definitively exclude a lower GI source.  Alternatively, I could repeat an upper endoscopy and provide hemostatic spray or gel to the surgerized area.  This again would only be a temporizing measure. - Given hemodynamic stability and stable hemoglobin, okay with continuing heparin   drip despite overtly bloody stools.  Reassured by patient's reported brown stool today.  With drop in hemoglobin this morning, a repeat was ordered for this afternoon, but has not yet been drawn - Will hold off on reinitiating oral anticoagulation while patient continuing to have intermittent bloody stools - Trend CBC every 12hr   Atrial fibrillation - CHA2DS2-VASc greater than 4 - Systemic anticoagulation strongly recommended by cardiology - Will try to restart oral anticoagulation in the next day or so.   Right upper extremity DVT - Noted on Doppler  ultrasound 1229 - Additional indication for anticoagulation   Lung cancer - Treatment on hold given his hospitalization    Glendia FORBES Holt, MD  02/08/2024, 9:00 PM [facility]  Moderate complex medical decision making (this includes chart review, review of results, face-to-face time used for counseling as well as treatment plan and follow-up. The patient was provided an opportunity to ask questions and all were answered. The patient agreed with the plan and demonstrated an understanding of the instructions

## 2024-02-08 NOTE — Progress Notes (Signed)
 PHARMACY - ANTICOAGULATION  Pharmacy Consult for heparin   Indication: atrial fibrillation and chronic RUE DVT Brief A/P: Heparin  level within goal range Continue Heparin  at current rate   Allergies[1]  Patient Measurements: Height: 6' 0.01 (182.9 cm) Weight: (!) 136.7 kg (301 lb 5.9 oz) IBW/kg (Calculated) : 77.62 HEPARIN  DW (KG): 103.8  Vital Signs: Temp: 97.6 F (36.4 C) (01/08 2041) Temp Source: Oral (01/08 2041) BP: 118/78 (01/08 2041) Pulse Rate: 95 (01/08 2041)  Labs: Recent Labs    02/06/24 0350 02/06/24 0836 02/06/24 2117 02/07/24 0958 02/08/24 0441 02/08/24 1057 02/08/24 2250  HGB 14.5   < > 14.0 13.7 12.8*  --  12.3*  HCT 43.0   < > 43.1 41.0 37.7*  --  36.5*  PLT 183  --   --  196 181  --   --   APTT  --   --  96*  --   --   --   --   HEPARINUNFRC 0.78*  --  0.58 0.53  --  <0.10* 0.41  CREATININE 1.31*  --   --  1.37* 1.37*  --   --    < > = values in this interval not displayed.    Estimated Creatinine Clearance: 70.8 mL/min (A) (by C-G formula based on SCr of 1.37 mg/dL (H)).   Assessment: 74 y.o. male with Afib and RUE DVT for heparin   Goal of Therapy:  Heparin  level 0.3-0.5 units/mL Monitor platelets by anticoagulation protocol: Yes   Plan:  No change to heparin    Cathlyn Arrant, PharmD, BCPS         [1] No Known Allergies

## 2024-02-09 ENCOUNTER — Other Ambulatory Visit: Payer: Self-pay

## 2024-02-09 DIAGNOSIS — I82621 Acute embolism and thrombosis of deep veins of right upper extremity: Secondary | ICD-10-CM | POA: Diagnosis not present

## 2024-02-09 DIAGNOSIS — K264 Chronic or unspecified duodenal ulcer with hemorrhage: Secondary | ICD-10-CM | POA: Diagnosis not present

## 2024-02-09 DIAGNOSIS — I4891 Unspecified atrial fibrillation: Secondary | ICD-10-CM | POA: Diagnosis not present

## 2024-02-09 DIAGNOSIS — Z9189 Other specified personal risk factors, not elsewhere classified: Secondary | ICD-10-CM | POA: Diagnosis not present

## 2024-02-09 DIAGNOSIS — I82629 Acute embolism and thrombosis of deep veins of unspecified upper extremity: Secondary | ICD-10-CM | POA: Diagnosis not present

## 2024-02-09 DIAGNOSIS — I482 Chronic atrial fibrillation, unspecified: Secondary | ICD-10-CM | POA: Diagnosis not present

## 2024-02-09 LAB — CBC WITH DIFFERENTIAL/PLATELET
Abs Immature Granulocytes: 0.04 K/uL (ref 0.00–0.07)
Basophils Absolute: 0 K/uL (ref 0.0–0.1)
Basophils Relative: 1 %
Eosinophils Absolute: 0.3 K/uL (ref 0.0–0.5)
Eosinophils Relative: 4 %
HCT: 35.6 % — ABNORMAL LOW (ref 39.0–52.0)
Hemoglobin: 12 g/dL — ABNORMAL LOW (ref 13.0–17.0)
Immature Granulocytes: 1 %
Lymphocytes Relative: 10 %
Lymphs Abs: 0.6 K/uL — ABNORMAL LOW (ref 0.7–4.0)
MCH: 29.8 pg (ref 26.0–34.0)
MCHC: 33.7 g/dL (ref 30.0–36.0)
MCV: 88.3 fL (ref 80.0–100.0)
Monocytes Absolute: 0.5 K/uL (ref 0.1–1.0)
Monocytes Relative: 8 %
Neutro Abs: 4.6 K/uL (ref 1.7–7.7)
Neutrophils Relative %: 76 %
Platelets: 176 K/uL (ref 150–400)
RBC: 4.03 MIL/uL — ABNORMAL LOW (ref 4.22–5.81)
RDW: 14.9 % (ref 11.5–15.5)
WBC: 6.1 K/uL (ref 4.0–10.5)
nRBC: 0 % (ref 0.0–0.2)

## 2024-02-09 LAB — PHOSPHORUS: Phosphorus: 3.6 mg/dL (ref 2.5–4.6)

## 2024-02-09 LAB — HEPARIN LEVEL (UNFRACTIONATED): Heparin Unfractionated: 0.44 [IU]/mL (ref 0.30–0.70)

## 2024-02-09 LAB — COMPREHENSIVE METABOLIC PANEL WITH GFR
ALT: 22 U/L (ref 0–44)
AST: 18 U/L (ref 15–41)
Albumin: 3.3 g/dL — ABNORMAL LOW (ref 3.5–5.0)
Alkaline Phosphatase: 75 U/L (ref 38–126)
Anion gap: 11 (ref 5–15)
BUN: 42 mg/dL — ABNORMAL HIGH (ref 8–23)
CO2: 23 mmol/L (ref 22–32)
Calcium: 9.1 mg/dL (ref 8.9–10.3)
Chloride: 102 mmol/L (ref 98–111)
Creatinine, Ser: 1.37 mg/dL — ABNORMAL HIGH (ref 0.61–1.24)
GFR, Estimated: 55 mL/min — ABNORMAL LOW
Glucose, Bld: 96 mg/dL (ref 70–99)
Potassium: 3.6 mmol/L (ref 3.5–5.1)
Sodium: 136 mmol/L (ref 135–145)
Total Bilirubin: 0.5 mg/dL (ref 0.0–1.2)
Total Protein: 6.4 g/dL — ABNORMAL LOW (ref 6.5–8.1)

## 2024-02-09 LAB — MAGNESIUM: Magnesium: 2.2 mg/dL (ref 1.7–2.4)

## 2024-02-09 NOTE — Progress Notes (Signed)
 Physical Therapy Treatment Patient Details Name: Raymond Hurst MRN: [id number] DOB: 1950-12-25 Today's Date: 02/09/2024   History of Present Illness 74 y.o. male admitted to [facility] 01/16/24 with transfer to [facility] ICU on 12/17 with dizziness, hematemesis, blood loss anemia, hemorrhagic shock. EGD 12/17 with duodenal active bleed, clipped; same day emergent ex lap and wound vac placement. S/p abdominal closure 12/19. ETT 12/17-12/23. RUE SVT and DVT noted 12/29. Off TPN 12/31; JP drain removed 12/31. Of note, recent admission 01/06/24-01/08/24 with GIB. Other PMH includes afib on Eliquis , CVA, recurrent GIB, stage IV lung CA (dx 06/2023), gout, previous ETOH abuse.   PT Comments  Pt progressing well with mobility. Today's session focused on gait and stair training; pt mobilizing with walker at supervision-level. Pt remains limited by generalized weakness, decreased activity tolerance, and impaired balance strategies/postural reactions. Increased time discussing safe d/c plan, which pt wants to discuss SNF vs HHPT with wife since she is not able to physically assist him at home. Will continue to follow acutely to address established goals.      If plan is discharge home, recommend the following: A little help with bathing/dressing/bathroom;Assist for transportation;Assistance with cooking/housework   Can travel by private vehicle     Yes  Equipment Recommendations  BSC/3in1;Hospital bed (pt does not have bed on main level, but also states he does not want to rent a hospital bed)    Recommendations for Other Services       Precautions / Restrictions Precautions Precautions: Fall Recall of Precautions/Restrictions: Intact Restrictions Weight Bearing Restrictions Per Provider Order: No     Mobility  Bed Mobility Overal bed mobility: Modified Independent Bed Mobility: Supine to Sit                Transfers Overall transfer level: Needs assistance Equipment used: Rollator (4  wheels) Transfers: Sit to/from Stand Sit to Stand: Supervision           General transfer comment: educ on rollator brake use; multiple sit<>stands from EOB, rollator seat and recliner, supervision for safety/lines    Ambulation/Gait Ambulation/Gait assistance: Supervision, Contact guard assist Gait Distance (Feet): 120 Feet (+ 120') Assistive device: Rollator (4 wheels), None Gait Pattern/deviations: Step-through pattern, Decreased stride length, Trunk flexed Gait velocity: Decreased     General Gait Details: slow, guarded gait with rollator and supervision for safety/lines, 1x seated rest break prior to stair training. pt taking a few steps in room without DME or UE support, CGA for balance   Stairs Stairs: Yes Stairs assistance: Contact guard assist Stair Management: One rail Left, Step to pattern, Forwards, Backwards Number of Stairs: 2 General stair comments: ascend 2 steps forwards and descend 2 steps backwards with LUE rail support, CGA for safety/lines   Wheelchair Mobility     Tilt Bed    Modified Rankin (Stroke Patients Only)       Balance Overall balance assessment: Needs assistance Sitting-balance support: Feet supported Sitting balance-Leahy Scale: Good     Standing balance support: No upper extremity supported, During functional activity Standing balance-Leahy Scale: Fair Standing balance comment: can stand and take steps without UE support                            Communication Communication Communication: Impaired Factors Affecting Communication: Hearing impaired;Other (comment) (reports HOH and sounding like things are underwater ever since having ears cleaned out while admitted; hears better out of R ear)  Cognition Arousal: Alert Behavior  During Therapy: WFL for tasks assessed/performed   PT - Cognitive impairments: No apparent impairments                       PT - Cognition Comments: WFL for simple tasks, not  formally assessed Following commands: Intact      Cueing    Exercises      General Comments General comments (skin integrity, edema, etc.): reviewed educ re: role of acute PT, POC, activity recommendations, importance of mobility, potential d/c needs including follow up PT, DME and assist needs. pt enjoyed using rollator during session but does not want for home since he has his RW already. pt still wants to pursue SNF but plans to discuss potential for home with HHPT with wife      Pertinent Vitals/Pain Pain Assessment Pain Assessment: No/denies pain Pain Intervention(s): Monitored during session    Home Living                          Prior Function            PT Goals (current goals can now be found in the care plan section) Progress towards PT goals: Progressing toward goals    Frequency    Min 2X/week      PT Plan      Co-evaluation              AM-PAC PT 6 Clicks Mobility   Outcome Measure  Help needed turning from your back to your side while in a flat bed without using bedrails?: None Help needed moving from lying on your back to sitting on the side of a flat bed without using bedrails?: None Help needed moving to and from a bed to a chair (including a wheelchair)?: A Little Help needed standing up from a chair using your arms (e.g., wheelchair or bedside chair)?: A Little Help needed to walk in hospital room?: A Little Help needed climbing 3-5 steps with a railing? : A Little 6 Click Score: 20    End of Session Equipment Utilized During Treatment: Gait belt Activity Tolerance: Patient tolerated treatment well Patient left: in chair;with call bell/phone within reach;with chair alarm set Nurse Communication: Mobility status PT Visit Diagnosis: Unsteadiness on feet (R26.81);Other abnormalities of gait and mobility (R26.89);Muscle weakness (generalized) (M62.81)     Time: 9149-9085 PT Time Calculation (min) (ACUTE ONLY): 24  min  Charges:    $Gait Training: 8-22 mins $Therapeutic Activity: 8-22 mins PT General Charges $$ ACUTE PT VISIT: 1 Visit                      Darice Almas, PT, DPT Acute Rehabilitation Services  Personal: Secure Chat Rehab Office: [phone]  Darice LITTIE Almas 02/09/2024, 10:25 AM

## 2024-02-09 NOTE — Progress Notes (Signed)
 " PROGRESS NOTE    Raymond Hurst  FMW:[id number] DOB: Dec 26, 1950 DOA: 01/16/2024 PCP: [name], MD   Brief Narrative:  74 year old male Left lung mass bronchogenic CA (poorly differentiated squamous cell) diagnosed 06/05/2023-bronchoscopy 08/09/2023 complicated by left PNA thorax-follows with Dr. Arlester carboplatin  and Taxol --currently durvalumab  Permanent A-fib CHA2DS2-VASc >4 on Eliquis  Previous heavy EtOH UGIB 09/09/2023 cratered ulcer duodenum status post mesenteric angio/embolization @ [facility] 12/6-12/8 readmission--with upper GI bleed clean-based ulcer told to continue twice daily PPI manage resumed Eliquis  hemoglobin was 9.6 on discharge  Significant Events: 12/16 [facility] ED eval dizziness nausea-couple of bright red vomit-->?  ICU [facility]--> [address] [place] transfer 2/2 12/17 ICU [facility]-->  [facility] 2/2--IR duodenal bleed Clip ->?  Continued ongoing bleeding requiring pressors--- massive transfusion protocol-----emergent ex lap Dr. Rubin with duodenotomy ligation GDA branches VAC placement 12/19 Secondary closure of abdomen Dr. Ebbie  12/26 barium swallow performed and upper GI 1226 without evidence of leak.  He has been intermittently bleeding and had a bowel movement that was hematochezia and CT angio abdomen pelvis on 1 3 showed no evidence of active GI bleeding.  DOAC remains being held but he has been resumed on a heparin  drip and having intermittently bloody bowel movements and had a large mahogany colored stool.  Continue heparin  drip but will need to continue to monitor given that blood can continues to drop slowly and may need a colonoscopy or repeat upper endoscopy for further evaluation  Assessment and Plan:  Duodenal ulcer hemorrhage with coiling 09/2023  s/p laparotomy 12/17, secondary closure 12/19 as above -Vomiting resolved since 12/26.  Off TPN on 12/31.  JP drain removed 12/31 -Tolerating mechanical soft diet.  Back on IV PPI BID -PT/OT  recommending SNF when medically stable and when Hgb Stablizes   Recurrent GI Bleed: Had multiple episodes of bloody and melanotic stools on 1/3 and 1/4 (images in media) - Vital signs remain stable - Hgb remains stable but continues to have Bloody Bowel movements. Hgb/Hct Trend:  Recent Labs  Lab 02/06/24 0350 02/06/24 0836 02/06/24 2117 02/07/24 0958 02/08/24 0441 02/08/24 2250 02/09/24 0226  HGB 14.5 14.9 14.0 13.7 12.8* 12.3* 12.0*  HCT 43.0 44.4 43.1 41.0 37.7* 36.5* 35.6*  MCV 89.6  --   --  88.0 88.5  --  88.3  -General surgery re-engated, following; hemoglobin remained stable so with GI and general surgery and feel that is okay to continue heparin  from their standpoint -CTA GIB negative for active bleeding - EGD (1/5) showed no active bleeding or high risk bleeding lesions and the bleeding was suspected to be from friable mucosa in the duodenal bulb at the site of surgical repair after DOAC started -Continue heparin  drip  -Continue holding Eliquis , GI now recommending holding off initiating oral anticoagulation while the patient continues to have bloody stools.  Recommending changing to Q6 H&H's to every 12;  -H&H now slowly dropping and GI feels that if he continues to bleed that the next step should be a colonoscopy to definitively exclude a lower GI source however they feel that he could repeat an upper endoscopy to provide hemostatic spray or gel to the surgerized area this would only be a temporizing measure. -Currently reassured that he has had no further bloody bowel movements today and had brown stool yesterday.  Hemoglobin is slowly dropping but still appears fairly stable -Continuing IV protonix  BID   Sepsis secondary to above  Staph epi bacteremia-MRSE with resistances Likely peritonitis - Bcx 12/21 postive  for staph epi - Bcx 12/26 no growth - Formally consulted ID on 12/30  - Pt has completed course of antibiotics as of 12/30  - RIJ CVC removed 1/2 -WBC Trend:   Recent Labs  Lab 02/02/24 0326 02/03/24 1129 02/04/24 0527 02/06/24 0350 02/07/24 0958 02/08/24 0441 02/09/24 0226  WBC 7.4 7.7 8.6 6.3 6.9 5.9 6.1   AKI on admission Metabolic Acidosis - Baseline Cr 0.7 - 0.9. Most recently has been consistent 1.31-1.34. Current BUN/Cr Trend: Recent Labs  Lab 02/03/24 1225 02/04/24 0527 02/05/24 0653 02/06/24 0350 02/07/24 0958 02/08/24 0441 02/09/24 0226  BUN 36* 33* 33* 34* 38* 40* 42*  CREATININE 1.43* 1.46* 1.37* 1.31* 1.37* 1.37* 1.37*  -Had a Slight Metabolic Acidosis and now improved. CO2 is 24, AG is 10, and Chloride Level is 101 -Avoid Nephrotoxic Medications, Contrast Dyes, Hypotension and Dehydration to Ensure Adequate Renal Perfusion and will need to Renally Adjust Meds -Continue to Monitor and Trend Renal Function carefully and repeat CMP in the AM   Atrial Fibrillation  -CHADVASC >4 -Patient's family discussed with Cardiology on 1/2, agreed to continue Eliquis . Please see cardiology consult note from 02/02/2024.  - Eliquis  was again stopped on 1/3 given episode of bloody stools/melena and continuing Heparin  gtt for now and consideration of resumption in the AM    Right cephalic vein acute SVT Right brachial vein acute DVT -Noted on RUE doppler on 12/29 -Apixaban  held as above but Heparin  being continued    Hypernatremia: Thought to be secondary to free water  deficit. Na+ Trend:  Recent Labs  Lab 02/03/24 1225 02/04/24 0527 02/05/24 0653 02/06/24 0350 02/07/24 0958 02/08/24 0441 02/09/24 0226  NA 135 137 138 135 135 135 136  -Resolved. CTM and Trend and repeat CMP in the AM    Hypokalemia: Resolved. K+ Trend:  Recent Labs  Lab 02/03/24 1225 02/04/24 0527 02/05/24 0653 02/06/24 0350 02/07/24 0958 02/08/24 0441 02/09/24 0226  K 4.5 4.2 4.4 4.0 4.3 3.8 3.6  -CTM and Replete as Necessary. Repeat CMP in the AM    Urinary Retention - Foley was initially removed and patient was voiding well. However, noted to  be retaining again today.  - Indwelling cath placed and will be kept at discharge. Will need outpt f/u with Urology   Left lung mass bronchogenic CA -Holding immunotherapy/chemotherapy -Will need Outpatient follow-up   Critical illness thrombocytopenia - resolved; Platelet Count is now 176  Hypoalbuminemia: Patient's Albumin Trend: Recent Labs  Lab 01/25/24 0435 01/27/24 0316 01/28/24 0355 01/29/24 0330 02/07/24 0958 02/08/24 0441 02/09/24 0226  ALBUMIN 2.6* 2.6* 2.9* 3.0* 3.3* 3.3* 3.3*  -Continue to Monitor and Trend and repeat CMP in the AM  Class III (Morbid) Obesity: Complicates overall prognosis and care. Estimated body mass index is 40.86 kg/m as calculated from the following:   Height as of this encounter: 6' 0.01 (1.829 m).   Weight as of this encounter: 136.7 kg. Weight Loss and Dietary Counseling given   DVT prophylaxis: SCDs Start: 01/16/24 1731    Code Status: Full Code Family Communication: No family present @ bedside   Disposition Plan:  Level of care: Telemetry Status is: Inpatient Remains inpatient appropriate because: Needs further clinical improvement and clearance by specialist and we need to ensure that he is not actively bleeding or dropping his hemoglobin further.  Will need SNF at discharge   Consultants:  Gastroenterology General Surgery  Procedures:  As delineated as above  Antimicrobials:  Anti-infectives (From admission, onward)  Start     Dose/Rate Route Frequency Ordered Stop   01/29/24 2200  fluconazole  (DIFLUCAN ) IVPB 400 mg  Status:  Discontinued        400 mg 100 mL/hr over 120 Minutes Intravenous Every 24 hours 01/29/24 1336 01/29/24 1346   01/29/24 2200  fluconazole  (DIFLUCAN ) IVPB 400 mg  Status:  Discontinued        400 mg 200 mL/hr over 60 Minutes Intravenous Every 24 hours 01/29/24 1346 01/30/24 1533   01/25/24 1500  vancomycin  (VANCOREADY) IVPB 1500 mg/300 mL  Status:  Discontinued        1,500 mg 150 mL/hr over 120  Minutes Intravenous Every 24 hours 01/24/24 1322 01/30/24 1233   01/24/24 1115  vancomycin  (VANCOCIN ) 2,500 mg in sodium chloride  0.9 % 500 mL IVPB        2,500 mg 262.5 mL/hr over 120 Minutes Intravenous  Once 01/24/24 1029 01/24/24 1530   01/17/24 2245  piperacillin -tazobactam (ZOSYN ) IVPB 3.375 g  Status:  Discontinued        3.375 g 12.5 mL/hr over 240 Minutes Intravenous Every 8 hours 01/17/24 2149 01/30/24 1233   01/17/24 2200  fluconazole  (DIFLUCAN ) IVPB 400 mg  Status:  Discontinued        400 mg 100 mL/hr over 120 Minutes Intravenous Every 24 hours 01/17/24 2059 01/29/24 1336       Subjective: Seen and examined at bedside and he is resting in the chair feeling better.  States he has no bowel movements today.  No nausea or vomiting.  Denied any lightheadedness or dizziness.  No other concerns or complaints at this time.  Objective: Vitals:   02/09/24 0423 02/09/24 0818 02/09/24 1604 02/09/24 1942  BP: 119/60 117/84 130/80 114/73  Pulse: 83 90 86 91  Resp: 19 17 18 18   Temp: 97.8 F (36.6 C) 97.6 F (36.4 C) 97.6 F (36.4 C) 97.6 F (36.4 C)  TempSrc: Oral     SpO2: 100% 100% 100% 97%  Weight:      Height:        Intake/Output Summary (Last 24 hours) at 02/09/2024 1942 Last data filed at 02/09/2024 1750 Gross per 24 hour  Intake 660 ml  Output 3400 ml  Net -2740 ml   Filed Weights   01/22/24 0232 01/25/24 0452 01/29/24 0406  Weight: 135.7 kg 127.2 kg (!) 136.7 kg   Examination: Physical Exam:  Constitutional: WN/WD morbidly obese chronically ill-appearing Caucasian male no acute distress Respiratory: Diminished to auscultation bilaterally, no wheezing, rales, rhonchi or crackles. Normal respiratory effort and patient is not tachypenic. No accessory muscle use.  Unlabored breathing Cardiovascular: RRR, no murmurs / rubs / gallops. S1 and S2 auscultated.  Mild extremity edema Abdomen: Soft, non-tender, distended secondary to body habitus. Bowel sounds positive.   GU: Deferred. Musculoskeletal: No clubbing / cyanosis of digits/nails. No joint deformity upper and lower extremities. Skin: No rashes, lesions, ulcers on limited skin evaluation. No induration; Warm and dry.  Neurologic: CN 2-12 grossly intact with no focal deficits. Romberg sign cerebellar reflexes not assessed.  Psychiatric: Awake and alert and appears calm  Data Reviewed: I have personally reviewed following labs and imaging studies  CBC: Recent Labs  Lab 02/04/24 0527 02/04/24 1616 02/06/24 0350 02/06/24 0836 02/06/24 2117 02/07/24 0958 02/08/24 0441 02/08/24 2250 02/09/24 0226  WBC 8.6  --  6.3  --   --  6.9 5.9  --  6.1  NEUTROABS  --   --   --   --   --  5.7 4.6  --  4.6  HGB 15.3   < > 14.5   < > 14.0 13.7 12.8* 12.3* 12.0*  HCT 46.5   < > 43.0   < > 43.1 41.0 37.7* 36.5* 35.6*  MCV 90.3  --  89.6  --   --  88.0 88.5  --  88.3  PLT 197  --  183  --   --  196 181  --  176   < > = values in this interval not displayed.   Basic Metabolic Panel: Recent Labs  Lab 02/05/24 0653 02/06/24 0350 02/07/24 0958 02/08/24 0441 02/09/24 0226  NA 138 135 135 135 136  K 4.4 4.0 4.3 3.8 3.6  CL 102 100 101 101 102  CO2 26 23 21* 24 23  GLUCOSE 104* 100* 136* 94 96  BUN 33* 34* 38* 40* 42*  CREATININE 1.37* 1.31* 1.37* 1.37* 1.37*  CALCIUM  9.2 8.9 9.0 9.2 9.1  MG  --   --  2.1 2.2 2.2  PHOS  --   --  3.4 3.2 3.6   GFR: Estimated Creatinine Clearance: 70.8 mL/min (A) (by C-G formula based on SCr of 1.37 mg/dL (H)). Liver Function Tests: Recent Labs  Lab 02/07/24 0958 02/08/24 0441 02/09/24 0226  AST 20 19 18   ALT 25 25 22   ALKPHOS 80 75 75  BILITOT 0.5 0.5 0.5  PROT 6.6 6.5 6.4*  ALBUMIN 3.3* 3.3* 3.3*   No results for input(s): LIPASE, AMYLASE in the last 168 hours. No results for input(s): AMMONIA in the last 168 hours. Coagulation Profile: No results for input(s): INR, PROTIME in the last 168 hours. Cardiac Enzymes: No results for input(s):  CKTOTAL, CKMB, CKMBINDEX, TROPONINI in the last 168 hours. BNP (last 3 results) No results for input(s): PROBNP in the last 8760 hours. HbA1C: No results for input(s): HGBA1C in the last 72 hours. CBG: No results for input(s): GLUCAP in the last 168 hours. Lipid Profile: No results for input(s): CHOL, HDL, LDLCALC, TRIG, CHOLHDL, LDLDIRECT in the last 72 hours. Thyroid  Function Tests: No results for input(s): TSH, T4TOTAL, FREET4, T3FREE, THYROIDAB in the last 72 hours. Anemia Panel: No results for input(s): VITAMINB12, FOLATE, FERRITIN, TIBC, IRON , RETICCTPCT in the last 72 hours. Sepsis Labs: No results for input(s): PROCALCITON, LATICACIDVEN in the last 168 hours.  No results found for this or any previous visit (from the past 240 hours).   Radiology Studies: No results found.  Scheduled Meds:  budesonide -glycopyrrolate -formoterol   2 puff Inhalation BID   Chlorhexidine  Gluconate Cloth  6 each Topical Daily   feeding supplement  237 mL Oral BID BM   Gerhardt's butt cream   Topical BID   multivitamin with minerals  1 tablet Oral Daily   mouth rinse  15 mL Mouth Rinse TID   pantoprazole  (PROTONIX ) IV  40 mg Intravenous Q12H   sucralfate   1 g Oral TID WC & HS   Continuous Infusions:  heparin  2,100 Units/hr (02/09/24 0749)    LOS: 23 days   Alejandro Marker, DO [facility] Available via Epic secure chat 7am-7pm After these hours, please refer to coverage provider listed on [email or web address] 02/09/2024, 7:42 PM  "

## 2024-02-09 NOTE — Progress Notes (Signed)
 PHARMACY - ANTICOAGULATION CONSULT NOTE  Pharmacy Consult for heparin   Indication: atrial fibrillation and chronic RUE DVT  Allergies[1]  Patient Measurements: Height: 6' 0.01 (182.9 cm) Weight: (!) 136.7 kg (301 lb 5.9 oz) IBW/kg (Calculated) : 77.62 HEPARIN  DW (KG): 103.8  Vital Signs: Temp: 97.8 F (36.6 C) (01/09 0423) Temp Source: Oral (01/09 0423) BP: 119/60 (01/09 0423) Pulse Rate: 83 (01/09 0423)  Labs: Recent Labs    02/06/24 2117 02/07/24 0958 02/08/24 0441 02/08/24 1057 02/08/24 2250 02/09/24 0226  HGB 14.0 13.7 12.8*  --  12.3* 12.0*  HCT 43.1 41.0 37.7*  --  36.5* 35.6*  PLT  --  196 181  --   --  176  APTT 96*  --   --   --   --   --   HEPARINUNFRC 0.58 0.53  --  <0.10* 0.41 0.44  CREATININE  --  1.37* 1.37*  --   --  1.37*    Estimated Creatinine Clearance: 70.8 mL/min (A) (by C-G formula based on SCr of 1.37 mg/dL (H)).   Assessment: 72 YOM w/ Hx AF with an RUE DVT in need of AC. Apixaban  PTA. Last dose was >7 day ago. He [facility] admitted to [facility] w/  duodenal ulcer hemorrhage which was clipped on 12/17.   Resuming heparin  s/p EGD, monitor on heparin  for a few days before/if resuming apixaban ,  Heparin  level therapeutic x2 levels at current rate 2100 units/hr. Hgb stable 12.0, platelets 176.   Goal of Therapy:  Heparin  level 0.3-0.5 units/mL Monitor platelets by anticoagulation protocol: Yes   Plan:  Continue heparin  infusion at 2100 units/hr Check heparin  level daily while on heparin  Continue to monitor H&H and platelets  Thank you for allowing pharmacy to be a part of this patients care.  Shelba Collier, PharmD, BCPS Clinical Pharmacist    [1] No Known Allergies

## 2024-02-09 NOTE — Plan of Care (Signed)
" °  Problem: Health Behavior/Discharge Planning: Goal: Ability to manage health-related needs will improve Outcome: Progressing   Problem: Clinical Measurements: Goal: Ability to maintain clinical measurements within normal limits will improve Outcome: Progressing Goal: Will remain free from infection Outcome: Progressing Goal: Diagnostic test results will improve Outcome: Progressing Goal: Respiratory complications will improve Outcome: Progressing Goal: Cardiovascular complication will be avoided Outcome: Progressing   Problem: Activity: Goal: Risk for activity intolerance will decrease Outcome: Progressing   Problem: Nutrition: Goal: Adequate nutrition will be maintained Outcome: Progressing   Problem: Coping: Goal: Level of anxiety will decrease Outcome: Progressing   Problem: Elimination: Goal: Will not experience complications related to bowel motility Outcome: Progressing Goal: Will not experience complications related to urinary retention Outcome: Progressing   Problem: Pain Managment: Goal: General experience of comfort will improve and/or be controlled Outcome: Progressing   Problem: Safety: Goal: Ability to remain free from injury will improve Outcome: Progressing   Problem: Skin Integrity: Goal: Risk for impaired skin integrity will decrease Outcome: Progressing   Problem: Education: Goal: Ability to describe self-care measures that may prevent or decrease complications (Diabetes Survival Skills Education) will improve Outcome: Progressing Goal: Individualized Educational Video(s) Outcome: Progressing   Problem: Coping: Goal: Ability to adjust to condition or change in health will improve Outcome: Progressing   Problem: Fluid Volume: Goal: Ability to maintain a balanced intake and output will improve Outcome: Progressing   Problem: Health Behavior/Discharge Planning: Goal: Ability to identify and utilize available resources and services will  improve Outcome: Progressing Goal: Ability to manage health-related needs will improve Outcome: Progressing   Problem: Metabolic: Goal: Ability to maintain appropriate glucose levels will improve Outcome: Progressing   Problem: Nutritional: Goal: Maintenance of adequate nutrition will improve Outcome: Progressing Goal: Progress toward achieving an optimal weight will improve Outcome: Progressing   Problem: Skin Integrity: Goal: Risk for impaired skin integrity will decrease Outcome: Progressing   Problem: Tissue Perfusion: Goal: Adequacy of tissue perfusion will improve Outcome: Progressing   Problem: Education: Goal: Knowledge of the prescribed therapeutic regimen will improve Outcome: Progressing   Problem: Bowel/Gastric: Goal: Gastrointestinal status for postoperative course will improve Outcome: Progressing   Problem: Cardiac: Goal: Ability to maintain an adequate cardiac output Outcome: Progressing Goal: Will show no evidence of cardiac arrhythmias Outcome: Progressing   Problem: Nutritional: Goal: Will attain and maintain optimal nutritional status Outcome: Progressing   Problem: Neurological: Goal: Will regain or maintain usual level of consciousness Outcome: Progressing   Problem: Clinical Measurements: Goal: Ability to maintain clinical measurements within normal limits Outcome: Progressing Goal: Postoperative complications will be avoided or minimized Outcome: Progressing   Problem: Respiratory: Goal: Will regain and/or maintain adequate ventilation Outcome: Progressing Goal: Respiratory status will improve Outcome: Progressing   Problem: Skin Integrity: Goal: Demonstrates signs of wound healing without infection Outcome: Progressing   Problem: Urinary Elimination: Goal: Will remain free from infection Outcome: Progressing Goal: Ability to achieve and maintain adequate urine output Outcome: Progressing   "

## 2024-02-09 NOTE — Progress Notes (Signed)
 [facility] ROUNDING NOTE   Subjective: Patient states that he had 1 bowel movement today and it was dark in color.  He did not see any obvious blood.  Hemoglobin drifted again today to 12 from 12.8 24 hours ago.  No other symptoms or concerns.  He feels like he is getting better with PT and regaining strength.   Objective: Vital signs in last 24 hours: Temp:  [97.6 F (36.4 C)-97.8 F (36.6 C)] 97.6 F (36.4 C) (01/09 1942) Pulse Rate:  [83-91] 91 (01/09 1942) Resp:  [17-19] 18 (01/09 1942) BP: (114-130)/(60-84) 114/73 (01/09 1942) SpO2:  [97 %-100 %] 99 % (01/09 1950) Last BM Date : 02/07/24 General: NAD, Caucasian male, sleeping but easily arousable, hard of hearing Lungs:  CTA b/l, no w/r/r Heart: Irregularly irregular, no m/r/g Abdomen:  Soft, NT, ND, +BS.  Midline abdominal wound dressing, not removed Ext:  No c/c/e    Intake/Output from previous day: 01/08 0701 - 01/09 0700 In: 940 [P.O.:940] Out: 3400 [Urine:3400] Intake/Output this shift: No intake/output data recorded.   Lab Results: Recent Labs    02/07/24 0958 02/08/24 0441 02/08/24 2250 02/09/24 0226  WBC 6.9 5.9  --  6.1  HGB 13.7 12.8* 12.3* 12.0*  PLT 196 181  --  176  MCV 88.0 88.5  --  88.3   BMET Recent Labs    02/07/24 0958 02/08/24 0441 02/09/24 0226  NA 135 135 136  K 4.3 3.8 3.6  CL 101 101 102  CO2 21* 24 23  GLUCOSE 136* 94 96  BUN 38* 40* 42*  CREATININE 1.37* 1.37* 1.37*  CALCIUM  9.0 9.2 9.1   LFT Recent Labs    02/07/24 0958 02/08/24 0441 02/09/24 0226  PROT 6.6 6.5 6.4*  ALBUMIN 3.3* 3.3* 3.3*  AST 20 19 18   ALT 25 25 22   ALKPHOS 80 75 75  BILITOT 0.5 0.5 0.5   PT/INR No results for input(s): INR in the last 72 hours.    Imaging/Other results: No results found.    Assessment and Plan:  74 year old male with history of COPD, sleep apnea, stage IV lung cancer, history of A-fib, history of CVA admitted with upper GI bleed from duodenal bulb  ulcer, which continued to bleed despite endoscopic therapies and IR embolization, eventually requiring ex lap, duodenotomy and ligation of the GDA and branches on January 17, 2024. He experienced recurrent bleeding January 3 after restarting Eliquis  January 2.  Eliquis  was again stopped January 3 and he underwent a repeat upper endoscopy January 5 which showed post surgical changes in the duodenal bulb characterized by suture material and friable mucosa, but no ulcer and no evidence of recurrent or active bleeding.  He was restarted on a heparin  drip January 5 after the EGD.     Duodenal bulb ulcer with hemorrhage requiring surgical hemostasis, with rebleeding after initiation of Eliquis  - EGD 02/05/2024 without high risk bleeding lesions.   Suspect patient must have rebled from friable mucosa at site of surgery in the setting of systemic anticoagulation - Very low suspicion for a secondary bleeding source, but if patient continues to bleed, I did offer a colonoscopy to definitively exclude a lower GI source.  Alternatively, I could repeat an upper endoscopy and provide hemostatic spray or gel to the surgerized area.  This again would only be a temporizing measure. - Patient's hemoglobin has drifted over 2 points in the past 2 days.  His stools remain intermittently bloody.  He has not had any  frank hematochezia to suggest a lower GI source.  Will reconsider repeat upper endoscopy in the next 24 to 48 hours if the patient's hemoglobin continues to decline with the low-grade bleeding, very unlikely that CTA or tagged RBC scan would be positive. - Will hold off on reinitiating oral anticoagulation while patient continuing to have intermittent bloody stools - Trend CBC every 12hr   Atrial fibrillation - CHA2DS2-VASc greater than 4 - Systemic anticoagulation strongly recommended by cardiology - Will try to restart oral anticoagulation when patient has had no overt bleeding and stable for at least 2  days.   Right upper extremity DVT - Noted on Doppler ultrasound 1229 - Additional indication for anticoagulation   Lung cancer - Treatment on hold given his hospitalization  Glendia FORBES Holt, MD  02/09/2024, 8:48 PM [facility]  Moderate complex medical decision making (this includes chart review, review of results, face-to-face time used for counseling as well as treatment plan and follow-up. The patient was provided an opportunity to ask questions and all were answered. The patient agreed with the plan and demonstrated an understanding of the instructions

## 2024-02-10 DIAGNOSIS — Z9189 Other specified personal risk factors, not elsewhere classified: Secondary | ICD-10-CM | POA: Diagnosis not present

## 2024-02-10 DIAGNOSIS — I482 Chronic atrial fibrillation, unspecified: Secondary | ICD-10-CM | POA: Diagnosis not present

## 2024-02-10 DIAGNOSIS — I82629 Acute embolism and thrombosis of deep veins of unspecified upper extremity: Secondary | ICD-10-CM | POA: Diagnosis not present

## 2024-02-10 DIAGNOSIS — I82621 Acute embolism and thrombosis of deep veins of right upper extremity: Secondary | ICD-10-CM | POA: Diagnosis not present

## 2024-02-10 DIAGNOSIS — K264 Chronic or unspecified duodenal ulcer with hemorrhage: Secondary | ICD-10-CM | POA: Diagnosis not present

## 2024-02-10 DIAGNOSIS — I4891 Unspecified atrial fibrillation: Secondary | ICD-10-CM | POA: Diagnosis not present

## 2024-02-10 LAB — COMPREHENSIVE METABOLIC PANEL WITH GFR
ALT: 20 U/L (ref 0–44)
AST: 16 U/L (ref 15–41)
Albumin: 3.3 g/dL — ABNORMAL LOW (ref 3.5–5.0)
Alkaline Phosphatase: 73 U/L (ref 38–126)
Anion gap: 11 (ref 5–15)
BUN: 38 mg/dL — ABNORMAL HIGH (ref 8–23)
CO2: 22 mmol/L (ref 22–32)
Calcium: 9.2 mg/dL (ref 8.9–10.3)
Chloride: 103 mmol/L (ref 98–111)
Creatinine, Ser: 1.27 mg/dL — ABNORMAL HIGH (ref 0.61–1.24)
GFR, Estimated: >60 mL/min
Glucose, Bld: 95 mg/dL (ref 70–99)
Potassium: 3.9 mmol/L (ref 3.5–5.1)
Sodium: 136 mmol/L (ref 135–145)
Total Bilirubin: 0.6 mg/dL (ref 0.0–1.2)
Total Protein: 6.3 g/dL — ABNORMAL LOW (ref 6.5–8.1)

## 2024-02-10 LAB — CBC WITH DIFFERENTIAL/PLATELET
Abs Immature Granulocytes: 0.05 K/uL (ref 0.00–0.07)
Basophils Absolute: 0.1 K/uL (ref 0.0–0.1)
Basophils Relative: 1 %
Eosinophils Absolute: 0.2 K/uL (ref 0.0–0.5)
Eosinophils Relative: 4 %
HCT: 34.7 % — ABNORMAL LOW (ref 39.0–52.0)
Hemoglobin: 11.8 g/dL — ABNORMAL LOW (ref 13.0–17.0)
Immature Granulocytes: 1 %
Lymphocytes Relative: 12 %
Lymphs Abs: 0.7 K/uL (ref 0.7–4.0)
MCH: 29.8 pg (ref 26.0–34.0)
MCHC: 34 g/dL (ref 30.0–36.0)
MCV: 87.6 fL (ref 80.0–100.0)
Monocytes Absolute: 0.5 K/uL (ref 0.1–1.0)
Monocytes Relative: 7 %
Neutro Abs: 4.6 K/uL (ref 1.7–7.7)
Neutrophils Relative %: 75 %
Platelets: 168 K/uL (ref 150–400)
RBC: 3.96 MIL/uL — ABNORMAL LOW (ref 4.22–5.81)
RDW: 15 % (ref 11.5–15.5)
WBC: 6.1 K/uL (ref 4.0–10.5)
nRBC: 0 % (ref 0.0–0.2)

## 2024-02-10 LAB — HEPARIN LEVEL (UNFRACTIONATED): Heparin Unfractionated: 0.48 [IU]/mL (ref 0.30–0.70)

## 2024-02-10 LAB — MAGNESIUM: Magnesium: 2.1 mg/dL (ref 1.7–2.4)

## 2024-02-10 LAB — PHOSPHORUS: Phosphorus: 3.7 mg/dL (ref 2.5–4.6)

## 2024-02-10 MED ORDER — SODIUM CHLORIDE (PF) 0.9 % IJ SOLN
INTRAMUSCULAR | Status: AC
  Administered 2024-02-10: 10 mL
  Filled 2024-02-10: qty 10

## 2024-02-10 NOTE — Progress Notes (Signed)
 [facility] ROUNDING NOTE   Subjective: Patient had large bowel movement today.  He states that it looked black, his nurse states that it was dark brown.  His hemoglobin drifted slightly again.  No other complaints.   Objective: Vital signs in last 24 hours: Temp:  [97.6 F (36.4 C)-98.4 F (36.9 C)] 98.1 F (36.7 C) (01/10 2001) Pulse Rate:  [79-88] 79 (01/10 2001) Resp:  [16-18] 18 (01/10 2001) BP: (103-123)/(61-79) 123/78 (01/10 2001) SpO2:  [94 %-100 %] 95 % (01/10 2001) Last BM Date : 02/10/24 General: NAD, pleasant Caucasian male      Intake/Output from previous day: 01/09 0701 - 01/10 0700 In: 660 [P.O.:660] Out: 4250 [Urine:4250] Intake/Output this shift: Total I/O In: -  Out: 800 [Urine:800]   Lab Results: Recent Labs    02/08/24 0441 02/08/24 2250 02/09/24 0226 02/10/24 0244  WBC 5.9  --  6.1 6.1  HGB 12.8* 12.3* 12.0* 11.8*  PLT 181  --  176 168  MCV 88.5  --  88.3 87.6   BMET Recent Labs    02/08/24 0441 02/09/24 0226 02/10/24 0244  NA 135 136 136  K 3.8 3.6 3.9  CL 101 102 103  CO2 24 23 22   GLUCOSE 94 96 95  BUN 40* 42* 38*  CREATININE 1.37* 1.37* 1.27*  CALCIUM  9.2 9.1 9.2   LFT Recent Labs    02/08/24 0441 02/09/24 0226 02/10/24 0244  PROT 6.5 6.4* 6.3*  ALBUMIN 3.3* 3.3* 3.3*  AST 19 18 16   ALT 25 22 20   ALKPHOS 75 75 73  BILITOT 0.5 0.5 0.6   PT/INR No results for input(s): INR in the last 72 hours.    Imaging/Other results: No results found.    Assessment and Plan:  Expand All Collapse All  [facility] ROUNDING NOTE     Subjective: Patient states that he had 1 bowel movement today and it was dark in color.  He did not see any obvious blood.  Hemoglobin drifted again today to 12 from 12.8 24 hours ago.  No other symptoms or concerns.  He feels like he is getting better with PT and regaining strength.     Objective: Vital signs in last 24 hours: Temp:  [97.6 F (36.4 C)-97.8  F (36.6 C)] 97.6 F (36.4 C) (01/09 1942) Pulse Rate:  [83-91] 91 (01/09 1942) Resp:  [17-19] 18 (01/09 1942) BP: (114-130)/(60-84) 114/73 (01/09 1942) SpO2:  [97 %-100 %] 99 % (01/09 1950) Last BM Date : 02/07/24 General: NAD, Caucasian male, sleeping but easily arousable, hard of hearing Lungs:  CTA b/l, no w/r/r Heart: Irregularly irregular, no m/r/g Abdomen:  Soft, NT, ND, +BS.  Midline abdominal wound dressing, not removed Ext:  No c/c/e       Intake/Output from previous day: 01/08 0701 - 01/09 0700 In: 940 [P.O.:940] Out: 3400 [Urine:3400] Intake/Output this shift: No intake/output data recorded.     Lab Results: Recent Labs (last 2 labs)        Recent Labs    02/07/24 0958 02/08/24 0441 02/08/24 2250 02/09/24 0226  WBC 6.9 5.9  --  6.1  HGB 13.7 12.8* 12.3* 12.0*  PLT 196 181  --  176  MCV 88.0 88.5  --  88.3      BMET Recent Labs (last 2 labs)       Recent Labs    02/07/24 0958 02/08/24 0441 02/09/24 0226  NA 135 135 136  K 4.3 3.8 3.6  CL 101 101  102  CO2 21* 24 23  GLUCOSE 136* 94 96  BUN 38* 40* 42*  CREATININE 1.37* 1.37* 1.37*  CALCIUM  9.0 9.2 9.1      LFT Recent Labs (last 2 labs)       Recent Labs    02/07/24 0958 02/08/24 0441 02/09/24 0226  PROT 6.6 6.5 6.4*  ALBUMIN 3.3* 3.3* 3.3*  AST 20 19 18   ALT 25 25 22   ALKPHOS 80 75 75  BILITOT 0.5 0.5 0.5      PT/INR Recent Labs (last 2 labs)  No results for input(s): INR in the last 72 hours.          Assessment and Plan:   74 year old male with history of COPD, sleep apnea, stage IV lung cancer, history of A-fib, history of CVA admitted with upper GI bleed from duodenal bulb ulcer, which continued to bleed despite endoscopic therapies and IR embolization, eventually requiring ex lap, duodenotomy and ligation of the GDA and branches on January 17, 2024. He experienced recurrent bleeding January 3 after restarting Eliquis  January 2.  Eliquis  was again stopped January 3  and he underwent a repeat upper endoscopy January 5 which showed post surgical changes in the duodenal bulb characterized by suture material and friable mucosa, but no ulcer and no evidence of recurrent or active bleeding.  He was restarted on a heparin  drip January 5 after the EGD.       Duodenal bulb ulcer with hemorrhage requiring surgical hemostasis, with rebleeding after initiation of Eliquis  - EGD 02/05/2024 without high risk bleeding lesions.   Suspect patient must have rebled from friable mucosa at site of surgery in the setting of systemic anticoagulation - Very low suspicion for a secondary bleeding source, but if patient continues to bleed, I did offer a colonoscopy to definitively exclude a lower GI source.  Alternatively, I could repeat an upper endoscopy and provide hemostatic spray or gel to the surgerized area.  This again would only be a temporizing measure. - Patient's hemoglobin has drifted steadily from 15 to 12 since restarting the heparin .  His stools remain intermittently bloody.  He has not had any frank hematochezia to suggest a lower GI source.  With the low-grade bleeding, very unlikely that CTA or tagged RBC scan would be positive. - Will hold off on reinitiating oral anticoagulation while patient continuing to have intermittent bloody stools - Trend CBC daily -I discussed with the patient options to include repeating an upper endoscopy, looking elsewhere for alternative bleeding sources or holding the heparin  again for a while to see if the hemoglobin stabilizes and bloody stool stop.  The patient was not eager to undergo any repeat procedures unless it was felt to have a high chance of helping.  I completely agree with this.  I recommended we hold the heparin  again to see if the bleeding will stop more completely and give his body a chance to further heal his surgical site.  I recommended this to his hospitalist, who plans to discuss with cardiology.   Atrial fibrillation -  CHA2DS2-VASc greater than 4 - Systemic anticoagulation strongly recommended by cardiology - Will try to restart oral anticoagulation when patient has had no overt bleeding and stable for at least 2 days.   Right upper extremity DVT - Noted on Doppler ultrasound 1229 - Additional indication for anticoagulation   Lung cancer - Treatment on hold given his hospitalization      Glendia FORBES Holt, MD  02/10/2024, 10:30 PM [facility]

## 2024-02-10 NOTE — Progress Notes (Signed)
 PHARMACY - ANTICOAGULATION CONSULT NOTE  Pharmacy Consult for heparin   Indication: atrial fibrillation and chronic RUE DVT  Allergies[1]  Patient Measurements: Height: 6' 0.01 (182.9 cm) Weight: (!) 136.7 kg (301 lb 5.9 oz) IBW/kg (Calculated) : 77.62 HEPARIN  DW (KG): 103.8  Vital Signs: Temp: 97.6 F (36.4 C) (01/10 0409) BP: 103/61 (01/10 0409) Pulse Rate: 80 (01/10 0409)  Labs: Recent Labs    02/08/24 0441 02/08/24 1057 02/08/24 2250 02/09/24 0226 02/10/24 0244  HGB 12.8*  --  12.3* 12.0* 11.8*  HCT 37.7*  --  36.5* 35.6* 34.7*  PLT 181  --   --  176 168  HEPARINUNFRC  --    < > 0.41 0.44 0.48  CREATININE 1.37*  --   --  1.37* 1.27*   < > = values in this interval not displayed.    Estimated Creatinine Clearance: 76.4 mL/min (A) (by C-G formula based on SCr of 1.27 mg/dL (H)).   Assessment: 63 YOM w/ Hx AF with an RUE DVT in need of AC. Apixaban  PTA. Last dose was >7 day ago. [facility] was admitted to [facility] w/  duodenal ulcer hemorrhage which was clipped on 12/17.   Resuming heparin  s/p EGD, monitor on heparin  for a few days before/if resuming apixaban .  Heparin  level therapeutic at 0.48 on 2100 units/hr. Hgb slowly trending down, currently 11.8, platelets are normal. He has had intermittent bloody stools. GI may repeat upper endoscopy in next 24-48 hrs.  Goal of Therapy:  Heparin  level 0.3-0.5 units/mL Monitor platelets by anticoagulation protocol: Yes   Plan:  Continue heparin  infusion at 2100 units/hr Check heparin  level daily while on heparin  Continue to monitor H&H and platelets F/U GI plans  Thank you for involving pharmacy in this patient's care.  Delon Sax, PharmD, BCPS Clinical Pharmacist Clinical phone for 02/10/2024 is [phone] 02/10/2024 8:12 AM     [1] No Known Allergies

## 2024-02-10 NOTE — Progress Notes (Signed)
 " PROGRESS NOTE    Raymond Hurst  FMW:[id number] DOB: 1950-09-24 DOA: 01/16/2024 PCP: [name], MD   Brief Narrative:  74 year old male Left lung mass bronchogenic CA (poorly differentiated squamous cell) diagnosed 06/05/2023-bronchoscopy 08/09/2023 complicated by left PNA thorax-follows with Dr. Arlester carboplatin  and Taxol --currently durvalumab  Permanent A-fib CHA2DS2-VASc >4 on Eliquis  Previous heavy EtOH UGIB 09/09/2023 cratered ulcer duodenum status post mesenteric angio/embolization @ [facility] 12/6-12/8 readmission--with upper GI bleed clean-based ulcer told to continue twice daily PPI manage resumed Eliquis  hemoglobin was 9.6 on discharge  Significant Events: 12/16 [facility] ED eval dizziness nausea-couple of bright red vomit-->?  ICU [facility]--> [facility] transfer 2/2 12/17 ICU [facility]-->  [facility] transfer 2/2--IR duodenal bleed Clip ->?  Continued ongoing bleeding requiring pressors--- massive transfusion protocol-----emergent ex lap Dr. Rubin with duodenotomy ligation GDA branches VAC placement 12/19 Secondary closure of abdomen Dr. Ebbie  12/26 barium swallow performed and upper GI 1226 without evidence of leak.  He has been intermittently bleeding and had a bowel movement that was hematochezia and CT angio abdomen pelvis on 1 3 showed no evidence of active GI bleeding.  DOAC remains being held but he has been resumed on a heparin  drip and having intermittently bloody bowel movements and had a large mahogany colored stool.  Was on heparin  drip but will need to continue to monitor given that blood can continues to drop slowly and may need a colonoscopy or repeat upper endoscopy for further evaluation.  The GI physician has spoken with patient and patient wants to defer EGD and colonoscopy at this time and after shared decision making we will hold the heparin  drip for a little while to see if he continues to lose  Assessment and Plan:  Duodenal ulcer hemorrhage with  coiling 09/2023  s/p laparotomy 12/17, secondary closure 12/19 as above -Vomiting resolved since 12/26.  Off TPN on 12/31.  JP drain removed 12/31 -Tolerating mechanical soft diet.  Back on IV PPI BID -PT/OT recommending SNF when medically stable and when Hgb Stablizes   Recurrent GI Bleed: Had multiple episodes of bloody and melanotic stools on 1/3 and 1/4 (images in media) - Vital signs remain stable - Hgb remains stable but continues to have Bloody Bowel movements. Hgb/Hct Trend:  Recent Labs  Lab 02/06/24 0836 02/06/24 2117 02/07/24 0958 02/08/24 0441 02/08/24 2250 02/09/24 0226 02/10/24 0244  HGB 14.9 14.0 13.7 12.8* 12.3* 12.0* 11.8*  HCT 44.4 43.1 41.0 37.7* 36.5* 35.6* 34.7*  MCV  --   --  88.0 88.5  --  88.3 87.6  -General surgery re-engated, following; hemoglobin remained stable so with GI and general surgery and feel that is okay to continue heparin  from their standpoint -CTA GIB negative for active bleeding - EGD (1/5) showed no active bleeding or high risk bleeding lesions and the bleeding was suspected to be from friable mucosa in the duodenal bulb at the site of surgical repair after DOAC started -Continue heparin  drip  -Continue holding Eliquis , GI now recommending holding off initiating oral anticoagulation while the patient continues to have bloody stools.  Recommending changing to Q6 H&H's to every 12;  -H&H now slowly dropping and GI feels that if he continues to bleed that the next step should be a colonoscopy to definitively exclude a lower GI source however they feel that he could repeat an upper endoscopy to provide hemostatic spray or gel to the surgerized area this would only be a temporizing measure.  Further discussion with the GI team  and the patient has elected to hold off on doing any procedures and has elected to go off of heparin  for a little while to see if he continues to ooze -Currently reassured that he has had no further bloody bowel movements today and  had brown stool yesterday.  Hemoglobin is slowly dropping but still appears fairly stable -Continuing IV protonix  BID   Sepsis 2/2 to Above  Staph epi Bacteremia-MRSE with resistances Likely peritonitis - Bcx 12/21 postive for staph epi - Bcx 12/26 no growth - Formally consulted ID on 12/30  - Pt has completed course of antibiotics as of 12/30  - RIJ CVC removed 1/2 -WBC Trend:  Recent Labs  Lab 02/03/24 1129 02/04/24 0527 02/06/24 0350 02/07/24 0958 02/08/24 0441 02/09/24 0226 02/10/24 0244  WBC 7.7 8.6 6.3 6.9 5.9 6.1 6.1   AKI on admission Metabolic Acidosis - Baseline Cr 0.7 - 0.9. Most recently has been consistent 1.31-1.34. Current BUN/Cr Trend: Recent Labs  Lab 02/04/24 0527 02/05/24 0653 02/06/24 0350 02/07/24 0958 02/08/24 0441 02/09/24 0226 02/10/24 0244  BUN 33* 33* 34* 38* 40* 42* 38*  CREATININE 1.46* 1.37* 1.31* 1.37* 1.37* 1.37* 1.27*  -Had a Slight Metabolic Acidosis and now improved. CO2 is 24, AG is 10, and Chloride Level is 101 -Avoid Nephrotoxic Medications, Contrast Dyes, Hypotension and Dehydration to Ensure Adequate Renal Perfusion and will need to Renally Adjust Meds -Continue to Monitor and Trend Renal Function carefully and repeat CMP in the AM   Atrial Fibrillation: CHADVASC >4. Patient's family discussed with Cardiology on 1/2, agreed to continue Eliquis . Please see cardiology consult note from 02/02/2024.  Eliquis  was again stopped on 1/3 given episode of bloody stools/melena and given that he continues to have a slow ooze will be discontinuing Heparin  gtt for now after discussion the the GI Team     Right cephalic vein acute SVT Right brachial vein acute DVT -Noted on RUE doppler on 12/29 -Apixaban  held as above ; Now will be holding Heparin  as well   Hypernatremia: Resolved. Thought to be secondary to free water  deficit. Na+ Stable and is 136 on last check. CTM and Trend and repeat CMP in the AM    Hypokalemia: Resolved. K+ is now 3.9 on  last check. CTM and Replete as Necessary. Repeat CMP in the AM    Urinary Retention: Foley was initially removed and patient was voiding well. However, noted to be retaining again today. Indwelling cath placed and will be kept at discharge. Will need outpt f/u with Urology   Left lung mass bronchogenic CA -Holding immunotherapy/chemotherapy -Will need Outpatient follow-up   Critical illness thrombocytopenia - resolved; Platelet Count is now 176  Hypoalbuminemia: Patient's Albumin Trend the last 7 days: Recent Labs  Lab 01/27/24 0316 01/28/24 0355 01/29/24 0330 02/07/24 0958 02/08/24 0441 02/09/24 0226 02/10/24 0244  ALBUMIN 2.6* 2.9* 3.0* 3.3* 3.3* 3.3* 3.3*  -Continue to Monitor and Trend and repeat CMP in the AM  Class III (Morbid) Obesity: Complicates overall prognosis and care. Estimated body mass index is 40.86 kg/m as calculated from the following:   Height as of this encounter: 6' 0.01 (1.829 m).   Weight as of this encounter: 136.7 kg. Weight Loss and Dietary Counseling given   DVT prophylaxis: SCDs Start: 01/16/24 1731    Code Status: Full Code Family Communication: No family present @ bedside  Disposition Plan:  Level of care: Telemetry Status is: Inpatient Remains inpatient appropriate because: Needs further clinical improvement and clearance by specialist and  we need to ensure that he is not actively bleeding or dropping his hemoglobin further. Will need SNF at discharge    Consultants:  Gastroenterology General Surgery  Procedures:  As delineated as above  Antimicrobials:  Anti-infectives (From admission, onward)    Start     Dose/Rate Route Frequency Ordered Stop   01/29/24 2200  fluconazole  (DIFLUCAN ) IVPB 400 mg  Status:  Discontinued        400 mg 100 mL/hr over 120 Minutes Intravenous Every 24 hours 01/29/24 1336 01/29/24 1346   01/29/24 2200  fluconazole  (DIFLUCAN ) IVPB 400 mg  Status:  Discontinued        400 mg 200 mL/hr over 60 Minutes  Intravenous Every 24 hours 01/29/24 1346 01/30/24 1533   01/25/24 1500  vancomycin  (VANCOREADY) IVPB 1500 mg/300 mL  Status:  Discontinued        1,500 mg 150 mL/hr over 120 Minutes Intravenous Every 24 hours 01/24/24 1322 01/30/24 1233   01/24/24 1115  vancomycin  (VANCOCIN ) 2,500 mg in sodium chloride  0.9 % 500 mL IVPB        2,500 mg 262.5 mL/hr over 120 Minutes Intravenous  Once 01/24/24 1029 01/24/24 1530   01/17/24 2245  piperacillin -tazobactam (ZOSYN ) IVPB 3.375 g  Status:  Discontinued        3.375 g 12.5 mL/hr over 240 Minutes Intravenous Every 8 hours 01/17/24 2149 01/30/24 1233   01/17/24 2200  fluconazole  (DIFLUCAN ) IVPB 400 mg  Status:  Discontinued        400 mg 100 mL/hr over 120 Minutes Intravenous Every 24 hours 01/17/24 2059 01/29/24 1336       Subjective: Seen and examined at bedside and continues to deny any pain.  States that he has not had any bowel movements yet.  No nausea or vomiting.  No lightheadedness or dizziness.  No other concerns or complaints at this time.  Objective: Vitals:   02/09/24 1950 02/10/24 0409 02/10/24 0920 02/10/24 1648  BP:  103/61 108/78 123/79  Pulse:  80 85 88  Resp:  18 18 18   Temp:  97.6 F (36.4 C) 98.2 F (36.8 C) 98.4 F (36.9 C)  TempSrc:      SpO2: 99% 96% 100% 94%  Weight:      Height:        Intake/Output Summary (Last 24 hours) at 02/10/2024 1804 Last data filed at 02/10/2024 1726 Gross per 24 hour  Intake 2452.5 ml  Output 3600 ml  Net -1147.5 ml   Filed Weights   01/22/24 0232 01/25/24 0452 01/29/24 0406  Weight: 135.7 kg 127.2 kg (!) 136.7 kg   Examination: Physical Exam:  Constitutional: WN/WD, obese chronically ill-appearing Caucasian male in no acute distress Respiratory: Diminished to auscultation bilaterally, no wheezing, rales, rhonchi or crackles. Normal respiratory effort and patient is not tachypenic. No accessory muscle use.  Unlabored breathing Cardiovascular: RRR, no murmurs / rubs / gallops. S1  and S2 auscultated.  Mild extremity edema Abdomen: Soft, non-tender, distended secondary to body habitus. Bowel sounds positive.  GU: Deferred. Musculoskeletal: No clubbing / cyanosis of digits/nails. No joint deformity upper and lower extremities.  Skin: No rashes, lesions, ulcers on limited skin evaluation. No induration; Warm and dry.  Neurologic: CN 2-12 grossly intact with no focal deficits. Romberg sign and cerebellar reflexes not assessed.  Psychiatric: Awake and alert and appears calm  Data Reviewed: I have personally reviewed following labs and imaging studies  CBC: Recent Labs  Lab 02/06/24 0350 02/06/24 0836 02/07/24 0958 02/08/24  9558 02/08/24 2250 02/09/24 0226 02/10/24 0244  WBC 6.3  --  6.9 5.9  --  6.1 6.1  NEUTROABS  --   --  5.7 4.6  --  4.6 4.6  HGB 14.5   < > 13.7 12.8* 12.3* 12.0* 11.8*  HCT 43.0   < > 41.0 37.7* 36.5* 35.6* 34.7*  MCV 89.6  --  88.0 88.5  --  88.3 87.6  PLT 183  --  196 181  --  176 168   < > = values in this interval not displayed.   Basic Metabolic Panel: Recent Labs  Lab 02/06/24 0350 02/07/24 0958 02/08/24 0441 02/09/24 0226 02/10/24 0244  NA 135 135 135 136 136  K 4.0 4.3 3.8 3.6 3.9  CL 100 101 101 102 103  CO2 23 21* 24 23 22   GLUCOSE 100* 136* 94 96 95  BUN 34* 38* 40* 42* 38*  CREATININE 1.31* 1.37* 1.37* 1.37* 1.27*  CALCIUM  8.9 9.0 9.2 9.1 9.2  MG  --  2.1 2.2 2.2 2.1  PHOS  --  3.4 3.2 3.6 3.7   GFR: Estimated Creatinine Clearance: 76.4 mL/min (A) (by C-G formula based on SCr of 1.27 mg/dL (H)). Liver Function Tests: Recent Labs  Lab 02/07/24 0958 02/08/24 0441 02/09/24 0226 02/10/24 0244  AST 20 19 18 16   ALT 25 25 22 20   ALKPHOS 80 75 75 73  BILITOT 0.5 0.5 0.5 0.6  PROT 6.6 6.5 6.4* 6.3*  ALBUMIN 3.3* 3.3* 3.3* 3.3*   No results for input(s): LIPASE, AMYLASE in the last 168 hours. No results for input(s): AMMONIA in the last 168 hours. Coagulation Profile: No results for input(s): INR,  PROTIME in the last 168 hours. Cardiac Enzymes: No results for input(s): CKTOTAL, CKMB, CKMBINDEX, TROPONINI in the last 168 hours. BNP (last 3 results) No results for input(s): PROBNP in the last 8760 hours. HbA1C: No results for input(s): HGBA1C in the last 72 hours. CBG: No results for input(s): GLUCAP in the last 168 hours. Lipid Profile: No results for input(s): CHOL, HDL, LDLCALC, TRIG, CHOLHDL, LDLDIRECT in the last 72 hours. Thyroid  Function Tests: No results for input(s): TSH, T4TOTAL, FREET4, T3FREE, THYROIDAB in the last 72 hours. Anemia Panel: No results for input(s): VITAMINB12, FOLATE, FERRITIN, TIBC, IRON , RETICCTPCT in the last 72 hours. Sepsis Labs: No results for input(s): PROCALCITON, LATICACIDVEN in the last 168 hours.  No results found for this or any previous visit (from the past 240 hours).   Radiology Studies: No results found.  Scheduled Meds:  budesonide -glycopyrrolate -formoterol   2 puff Inhalation BID   Chlorhexidine  Gluconate Cloth  6 each Topical Daily   feeding supplement  237 mL Oral BID BM   Gerhardt's butt cream   Topical BID   multivitamin with minerals  1 tablet Oral Daily   mouth rinse  15 mL Mouth Rinse TID   pantoprazole  (PROTONIX ) IV  40 mg Intravenous Q12H   sucralfate   1 g Oral TID WC & HS   Continuous Infusions:  heparin  2,100 Units/hr (02/10/24 1726)    LOS: 24 days   Alejandro Marker, DO [facility] Available via Epic secure chat 7am-7pm After these hours, please refer to coverage provider listed on [email or web address] 02/10/2024, 6:04 PM  "

## 2024-02-10 NOTE — Plan of Care (Signed)

## 2024-02-11 ENCOUNTER — Inpatient Hospital Stay (HOSPITAL_COMMUNITY)

## 2024-02-11 ENCOUNTER — Encounter (HOSPITAL_COMMUNITY): Payer: Self-pay | Admitting: Internal Medicine

## 2024-02-11 ENCOUNTER — Encounter (HOSPITAL_COMMUNITY)
Admission: EM | Disposition: A | Discharge status: Home / Self Care | Payer: Self-pay | Source: Home / Self Care | Attending: Internal Medicine

## 2024-02-11 ENCOUNTER — Inpatient Hospital Stay (HOSPITAL_COMMUNITY): Admitting: Anesthesiology

## 2024-02-11 DIAGNOSIS — I82629 Acute embolism and thrombosis of deep veins of unspecified upper extremity: Secondary | ICD-10-CM | POA: Diagnosis not present

## 2024-02-11 DIAGNOSIS — K5710 Diverticulosis of small intestine without perforation or abscess without bleeding: Secondary | ICD-10-CM

## 2024-02-11 DIAGNOSIS — T183XXA Foreign body in small intestine, initial encounter: Secondary | ICD-10-CM | POA: Diagnosis not present

## 2024-02-11 DIAGNOSIS — Z87891 Personal history of nicotine dependence: Secondary | ICD-10-CM | POA: Diagnosis not present

## 2024-02-11 DIAGNOSIS — I10 Essential (primary) hypertension: Secondary | ICD-10-CM

## 2024-02-11 DIAGNOSIS — K264 Chronic or unspecified duodenal ulcer with hemorrhage: Secondary | ICD-10-CM | POA: Diagnosis not present

## 2024-02-11 DIAGNOSIS — G4733 Obstructive sleep apnea (adult) (pediatric): Secondary | ICD-10-CM | POA: Diagnosis not present

## 2024-02-11 DIAGNOSIS — I482 Chronic atrial fibrillation, unspecified: Secondary | ICD-10-CM | POA: Diagnosis not present

## 2024-02-11 DIAGNOSIS — R578 Other shock: Secondary | ICD-10-CM | POA: Diagnosis not present

## 2024-02-11 DIAGNOSIS — Z9189 Other specified personal risk factors, not elsewhere classified: Secondary | ICD-10-CM | POA: Diagnosis not present

## 2024-02-11 DIAGNOSIS — I4891 Unspecified atrial fibrillation: Secondary | ICD-10-CM | POA: Diagnosis not present

## 2024-02-11 DIAGNOSIS — K3189 Other diseases of stomach and duodenum: Secondary | ICD-10-CM | POA: Diagnosis not present

## 2024-02-11 DIAGNOSIS — I82621 Acute embolism and thrombosis of deep veins of right upper extremity: Secondary | ICD-10-CM | POA: Diagnosis not present

## 2024-02-11 DIAGNOSIS — E861 Hypovolemia: Secondary | ICD-10-CM | POA: Diagnosis not present

## 2024-02-11 DIAGNOSIS — K921 Melena: Secondary | ICD-10-CM | POA: Diagnosis not present

## 2024-02-11 DIAGNOSIS — E440 Moderate protein-calorie malnutrition: Secondary | ICD-10-CM | POA: Diagnosis not present

## 2024-02-11 DIAGNOSIS — D62 Acute posthemorrhagic anemia: Secondary | ICD-10-CM | POA: Diagnosis not present

## 2024-02-11 HISTORY — PX: ESOPHAGOGASTRODUODENOSCOPY: SHX5428

## 2024-02-11 LAB — COMPREHENSIVE METABOLIC PANEL WITH GFR
ALT: 22 U/L (ref 0–44)
AST: 19 U/L (ref 15–41)
Albumin: 3.3 g/dL — ABNORMAL LOW (ref 3.5–5.0)
Alkaline Phosphatase: 74 U/L (ref 38–126)
Anion gap: 11 (ref 5–15)
BUN: 34 mg/dL — ABNORMAL HIGH (ref 8–23)
CO2: 23 mmol/L (ref 22–32)
Calcium: 9.1 mg/dL (ref 8.9–10.3)
Chloride: 103 mmol/L (ref 98–111)
Creatinine, Ser: 1.17 mg/dL (ref 0.61–1.24)
GFR, Estimated: >60 mL/min
Glucose, Bld: 98 mg/dL (ref 70–99)
Potassium: 3.5 mmol/L (ref 3.5–5.1)
Sodium: 136 mmol/L (ref 135–145)
Total Bilirubin: 0.5 mg/dL (ref 0.0–1.2)
Total Protein: 6.4 g/dL — ABNORMAL LOW (ref 6.5–8.1)

## 2024-02-11 LAB — CBC WITH DIFFERENTIAL/PLATELET
Abs Immature Granulocytes: 0.04 K/uL (ref 0.00–0.07)
Abs Immature Granulocytes: 0.1 K/uL — ABNORMAL HIGH (ref 0.00–0.07)
Abs Immature Granulocytes: 0.05 K/uL (ref 0.00–0.07)
Basophils Absolute: 0 K/uL (ref 0.0–0.1)
Basophils Absolute: 0 K/uL (ref 0.0–0.1)
Basophils Absolute: 0 K/uL (ref 0.0–0.1)
Basophils Relative: 1 %
Basophils Relative: 0 %
Basophils Relative: 1 %
Eosinophils Absolute: 0.2 K/uL (ref 0.0–0.5)
Eosinophils Absolute: 0.1 K/uL (ref 0.0–0.5)
Eosinophils Absolute: 0.2 K/uL (ref 0.0–0.5)
Eosinophils Relative: 3 %
Eosinophils Relative: 1 %
Eosinophils Relative: 2 %
HCT: 33.6 % — ABNORMAL LOW (ref 39.0–52.0)
HCT: 29.8 % — ABNORMAL LOW (ref 39.0–52.0)
HCT: 28.2 % — ABNORMAL LOW (ref 39.0–52.0)
Hemoglobin: 11.5 g/dL — ABNORMAL LOW (ref 13.0–17.0)
Hemoglobin: 10.3 g/dL — ABNORMAL LOW (ref 13.0–17.0)
Hemoglobin: 9.5 g/dL — ABNORMAL LOW (ref 13.0–17.0)
Immature Granulocytes: 1 %
Immature Granulocytes: 1 %
Immature Granulocytes: 1 %
Lymphocytes Relative: 11 %
Lymphocytes Relative: 5 %
Lymphocytes Relative: 7 %
Lymphs Abs: 0.6 K/uL — ABNORMAL LOW (ref 0.7–4.0)
Lymphs Abs: 0.5 K/uL — ABNORMAL LOW (ref 0.7–4.0)
Lymphs Abs: 0.6 K/uL — ABNORMAL LOW (ref 0.7–4.0)
MCH: 29.9 pg (ref 26.0–34.0)
MCH: 30.3 pg (ref 26.0–34.0)
MCH: 30.7 pg (ref 26.0–34.0)
MCHC: 34.2 g/dL (ref 30.0–36.0)
MCHC: 34.6 g/dL (ref 30.0–36.0)
MCHC: 33.7 g/dL (ref 30.0–36.0)
MCV: 87.5 fL (ref 80.0–100.0)
MCV: 87.6 fL (ref 80.0–100.0)
MCV: 91.3 fL (ref 80.0–100.0)
Monocytes Absolute: 0.5 K/uL (ref 0.1–1.0)
Monocytes Absolute: 0.6 K/uL (ref 0.1–1.0)
Monocytes Absolute: 0.6 K/uL (ref 0.1–1.0)
Monocytes Relative: 8 %
Monocytes Relative: 6 %
Monocytes Relative: 7 %
Neutro Abs: 4.4 K/uL (ref 1.7–7.7)
Neutro Abs: 9.1 K/uL — ABNORMAL HIGH (ref 1.7–7.7)
Neutro Abs: 6.9 K/uL (ref 1.7–7.7)
Neutrophils Relative %: 76 %
Neutrophils Relative %: 87 %
Neutrophils Relative %: 82 %
Platelets: 160 K/uL (ref 150–400)
Platelets: 182 K/uL (ref 150–400)
Platelets: 164 K/uL (ref 150–400)
RBC: 3.84 MIL/uL — ABNORMAL LOW (ref 4.22–5.81)
RBC: 3.4 MIL/uL — ABNORMAL LOW (ref 4.22–5.81)
RBC: 3.09 MIL/uL — ABNORMAL LOW (ref 4.22–5.81)
RDW: 15 % (ref 11.5–15.5)
RDW: 15.1 % (ref 11.5–15.5)
RDW: 15.5 % (ref 11.5–15.5)
WBC: 5.7 K/uL (ref 4.0–10.5)
WBC: 10.4 K/uL (ref 4.0–10.5)
WBC: 8.3 K/uL (ref 4.0–10.5)
nRBC: 0 % (ref 0.0–0.2)
nRBC: 0 % (ref 0.0–0.2)
nRBC: 0 % (ref 0.0–0.2)

## 2024-02-11 LAB — GLUCOSE, CAPILLARY: Glucose-Capillary: 144 mg/dL — ABNORMAL HIGH (ref 70–99)

## 2024-02-11 LAB — HEPARIN LEVEL (UNFRACTIONATED): Heparin Unfractionated: 0.53 [IU]/mL (ref 0.30–0.70)

## 2024-02-11 LAB — MAGNESIUM: Magnesium: 2 mg/dL (ref 1.7–2.4)

## 2024-02-11 LAB — PHOSPHORUS: Phosphorus: 3.2 mg/dL (ref 2.5–4.6)

## 2024-02-11 MED ORDER — PANTOPRAZOLE SODIUM 40 MG IV SOLR
40.0000 mg | Freq: Two times a day (BID) | INTRAVENOUS | Status: DC
  Administered 2024-02-12 – 2024-02-15 (×8): 40 mg via INTRAVENOUS
  Filled 2024-02-11 (×8): qty 10

## 2024-02-11 MED ORDER — PANTOPRAZOLE SODIUM 40 MG IV SOLR
40.0000 mg | INTRAVENOUS | Status: AC
  Administered 2024-02-11 (×2): 40 mg via INTRAVENOUS
  Filled 2024-02-11 (×2): qty 10

## 2024-02-11 MED ORDER — PHENYLEPHRINE HCL (PRESSORS) 10 MG/ML IV SOLN
INTRAVENOUS | Status: DC | PRN
  Administered 2024-02-11: 80 ug via INTRAVENOUS

## 2024-02-11 MED ORDER — PROPOFOL 500 MG/50ML IV EMUL
INTRAVENOUS | Status: DC | PRN
  Administered 2024-02-11: 180 ug/kg/min via INTRAVENOUS

## 2024-02-11 MED ORDER — ONDANSETRON HCL 4 MG/2ML IJ SOLN: 4.0000 mg | Freq: Once | INTRAMUSCULAR | Status: DC | PRN

## 2024-02-11 MED ORDER — IOHEXOL 350 MG/ML SOLN
100.0000 mL | Freq: Once | INTRAVENOUS | Status: AC | PRN
  Administered 2024-02-11: 100 mL via INTRAVENOUS

## 2024-02-11 MED ORDER — SODIUM CHLORIDE (PF) 0.9 % IJ SOLN
INTRAMUSCULAR | Status: AC
  Administered 2024-02-11: 10 mL
  Filled 2024-02-11: qty 20

## 2024-02-11 MED ORDER — SODIUM CHLORIDE 0.9 % IV SOLN: INTRAVENOUS | Status: DC

## 2024-02-11 MED ORDER — CARBAMIDE PEROXIDE 6.5 % OT SOLN
5.0000 [drp] | Freq: Two times a day (BID) | OTIC | Status: DC
  Administered 2024-02-12 – 2024-02-18 (×8): 5 [drp] via OTIC
  Filled 2024-02-11: qty 15

## 2024-02-11 MED ORDER — FENTANYL CITRATE (PF) 100 MCG/2ML IJ SOLN: 25.0000 ug | INTRAMUSCULAR | Status: DC | PRN

## 2024-02-11 MED ORDER — SODIUM CHLORIDE 0.9 % IV SOLN: INTRAVENOUS | Status: DC | PRN

## 2024-02-11 MED ORDER — SODIUM CHLORIDE 0.9 % IV BOLUS
1000.0000 mL | Freq: Once | INTRAVENOUS | Status: AC
  Administered 2024-02-11: 1000 mL via INTRAVENOUS

## 2024-02-11 MED ORDER — POTASSIUM CHLORIDE 10 MEQ/100ML IV SOLN
10.0000 meq | INTRAVENOUS | Status: AC
  Administered 2024-02-11 (×4): 10 meq via INTRAVENOUS
  Filled 2024-02-11 (×4): qty 100

## 2024-02-11 NOTE — Anesthesia Postprocedure Evaluation (Signed)
"   Anesthesia Post Note  Patient: Raymond Hurst  Procedure(s) Performed: EGD (ESOPHAGOGASTRODUODENOSCOPY)     Patient location during evaluation: PACU Anesthesia Type: MAC Level of consciousness: awake Pain management: pain level controlled Vital Signs Assessment: post-procedure vital signs reviewed and stable Respiratory status: spontaneous breathing, nonlabored ventilation and respiratory function stable Cardiovascular status: blood pressure returned to baseline and stable Postop Assessment: no apparent nausea or vomiting Anesthetic complications: no   No notable events documented.  Last Vitals:  Vitals:   02/11/24 1148 02/11/24 1151  BP: 90/69 101/67  Pulse: 82   Resp: 20   Temp: (!) 36.4 C   SpO2: 98%     Last Pain:  Vitals:   02/11/24 1148  TempSrc: Oral  PainSc:    Pain Goal: Patients Stated Pain Goal: 0 (02/09/24 1128)                 Raymond Hurst P Carrina Schoenberger      "

## 2024-02-11 NOTE — Progress Notes (Signed)
 " PROGRESS NOTE    Raymond Hurst  FMW:[id number] DOB: 02/12/1950 DOA: 01/16/2024 PCP: [name], MD   Brief Narrative:  74 year old male Left lung mass bronchogenic CA (poorly differentiated squamous cell) diagnosed 06/05/2023-bronchoscopy 08/09/2023 complicated by left PNA thorax-follows with Dr. Arlester carboplatin  and Taxol --currently durvalumab  Permanent A-fib CHA2DS2-VASc >4 on Eliquis  Previous heavy EtOH UGIB 09/09/2023 cratered ulcer duodenum status post mesenteric angio/embolization @ [facility] 12/6-12/8 readmission--with upper GI bleed clean-based ulcer told to continue twice daily PPI manage resumed Eliquis  hemoglobin was 9.6 on discharge  Significant Events: 12/16 [facility] ED eval dizziness nausea-couple of bright red vomit-->?  ICU [facility]--> [address] [place] transfer 2/2 12/17 ICU [facility]-->  [facility] 2/2--IR duodenal bleed Clip ->?  Continued ongoing bleeding requiring pressors--- massive transfusion protocol-----emergent ex lap Dr. Rubin with duodenotomy ligation GDA branches VAC placement 12/19 Secondary closure of abdomen Dr. Ebbie  12/26 barium swallow performed and upper GI 1226 without evidence of leak.  He has been intermittently bleeding and slowly oozing all week and had a bowel movement that was hematochezia and CT angio abdomen pelvis on 1/3 showed no evidence of active GI bleeding.  DOAC remains being held but he has been resumed on a heparin  drip and  was continued despite having intermittently bloody bowel movements given his stable and slow drop. Heparin  drip continued however patient became more symptomatic today and dropped his blood count and heparin  drip was discontinued and he was taken for an urgent EGD which showed no active bleeding upper GI tract.  CT angio bleed did not show any active bleed.  Heparin  drip remains discontinued awaiting recs to monitor his hemoglobin carefully. After procedures he is back on Regular Diet  Assessment and  Plan:  Duodenal ulcer hemorrhage with coiling 09/2023  s/p laparotomy 12/17, secondary closure 12/19 as above -Vomiting resolved since 12/26.  Off TPN on 12/31.  JP drain removed 12/31 -Tolerating mechanical soft diet.  Back on IV PPI BID -PT/OT recommending SNF when medically stable and when Hgb Stablizes he has drop in his hemoglobin and became symptomatic today   Recurrent GI Bleed Symptomatic Anemia: Had multiple episodes of bloody and melanotic stools on 1/3 and 1/4 and now has had repeat and large volume marroon colored stool this AM assoc(images in media) -Vital signs had remained stable but was lightheaded and dizzy today with associated drop in BP and had Tachycardia -Given 1 liter bolus, TED hose added.  -Hgb had been slowly dropping and hadd been oozing all week; he is having intermittent Bloody Bowel Movements but today was worse than his prior. Hgb/Hct Trend:  Recent Labs  Lab 02/07/24 0958 02/08/24 0441 02/08/24 2250 02/09/24 0226 02/10/24 0244 02/11/24 0244 02/11/24 0904  HGB 13.7 12.8* 12.3* 12.0* 11.8* 11.5* 10.3*  HCT 41.0 37.7* 36.5* 35.6* 34.7* 33.6* 29.8*  MCV 88.0 88.5  --  88.3 87.6 87.5 87.6  -General surgery re-engated, following; hemoglobin remained stable so with GI and general surgery and feel that is okay to continue heparin  from their standpoint -02/03/24 CTA GIB negative for active bleeding - EGD (1/5) showed no active bleeding or high risk bleeding lesions and the bleeding was suspected to be from friable mucosa in the duodenal bulb at the site of surgical repair after DOAC started -Discontinue heparin  drip  -Continue holding Eliquis , GI now recommending holding off initiating oral anticoagulation while the patient continues to have bloody stools.  Recommending changing to Q6 H&H's to every 12;  -Underwent Repeat EGD: Normal esophagus  with erythematous mucosa in the gastric body with intact suture and erythematous friable mucosa but no evidence of recent  bleeding for this area.  Given nonbleeding duodenal diverticulum and no blood or evidence of recent bleeding in the upper GI tract and is felt that the source of his new recurrent bleeding is from another source and possibly a diverticulum -Underwent repeat CTA GI Bleed 02/11/24 and showed Colonic diverticulosis, with improving diverticulitis of the sigmoid colon. No perforation, fluid collection, or abscess. Stable right adrenal myelolipoma. Stable sequela of chronic bladder outlet obstruction. The bladder is decompressed with a Foley catheter. Small left pleural effusion. -Continuing IV protonix  BID with additional Pantoprazole  Bolus today    Sepsis 2/2 to Above  Staph epi Bacteremia-MRSE with resistances Likely peritonitis - Bcx 12/21 postive for staph epi - Bcx 12/26 no growth - Formally consulted ID on 12/30  - Pt has completed course of antibiotics as of 12/30  - RIJ CVC removed 1/2 -WBC Trend:  Recent Labs  Lab 02/06/24 0350 02/07/24 0958 02/08/24 0441 02/09/24 0226 02/10/24 0244 02/11/24 0244 02/11/24 0904  WBC 6.3 6.9 5.9 6.1 6.1 5.7 10.4   AKI on admission Metabolic Acidosis - Baseline Cr 0.7 - 0.9. Most recently has been consistent 1.31-1.34. Current BUN/Cr Trend: Recent Labs  Lab 02/05/24 0653 02/06/24 0350 02/07/24 0958 02/08/24 0441 02/09/24 0226 02/10/24 0244 02/11/24 0244  BUN 33* 34* 38* 40* 42* 38* 34*  CREATININE 1.37* 1.31* 1.37* 1.37* 1.37* 1.27* 1.17  -Had a Slight Metabolic Acidosis and now improved. CO2 is 23, AG is 11, and Chloride Level is 103 -Given his hypotension today we will give him 1 L bolus. -Avoid Nephrotoxic Medications, Contrast Dyes, Hypotension and Dehydration to Ensure Adequate Renal Perfusion and will need to Renally Adjust Meds -Continue to Monitor and Trend Renal Function carefully and repeat CMP in the AM   Atrial Fibrillation: CHADVASC >4. Patient's family discussed with Cardiology on 1/2, agreed to continue Eliquis . Please see  cardiology consult note from 02/02/2024.  Eliquis  was again stopped on 1/3 given episode of bloody stools/melena and given that he continues to drop his Hgb and had Symptoms will Discontinue Heparin  gtt altogether.    Right cephalic vein acute SVT Right brachial vein acute DVT -Noted on RUE doppler on 12/29 -Apixaban  held as above ; Now will be holding Heparin  as well   Hypernatremia: Resolved. Thought to be secondary to free water  deficit. Na+ Stable and is 136 on last check. CTM and Trend and repeat CMP in the AM    Hypokalemia: Resolved. K+ is now 3.5 on last check. CTM and Replete as Necessary. Repeat CMP in the AM    Urinary Retention: Foley was initially removed and patient was voiding well. However, noted to be retaining again today. Indwelling cath placed and will be kept at discharge. Will need outpt f/u with Urology   Left lung mass bronchogenic CA -Holding immunotherapy/chemotherapy -Will need Outpatient follow-up   Critical illness thrombocytopenia - resolved; Platelet Count is now 176  Hypoalbuminemia: Patient's Albumin Trend the last 7 days: Recent Labs  Lab 01/28/24 0355 01/29/24 0330 02/07/24 0958 02/08/24 0441 02/09/24 0226 02/10/24 0244 02/11/24 0244  ALBUMIN 2.9* 3.0* 3.3* 3.3* 3.3* 3.3* 3.3*  -Continue to Monitor and Trend and repeat CMP in the AM  Class III (Morbid) Obesity: Complicates overall prognosis and care. Estimated body mass index is 40.86 kg/m as calculated from the following:   Height as of this encounter: 6' 0.01 (1.829 m).   Weight  as of this encounter: 136.7 kg. Weight Loss and Dietary Counseling given   DVT prophylaxis: Place TED hose Start: 02/11/24 0809 SCDs Start: 01/16/24 1731    Code Status: Full Code Family Communication: No family present @ bedside  Disposition Plan:  Level of care: Telemetry Status is: Inpatient Remains inpatient appropriate because: Further clinical improvement and clearance by specialist given that he he had  symptomatic anemia today from more GI bleeding   Consultants:  General Surgery Gastroenterology  Procedures:  As delineated as above   Antimicrobials:  Anti-infectives (From admission, onward)    Start     Dose/Rate Route Frequency Ordered Stop   01/29/24 2200  fluconazole  (DIFLUCAN ) IVPB 400 mg  Status:  Discontinued        400 mg 100 mL/hr over 120 Minutes Intravenous Every 24 hours 01/29/24 1336 01/29/24 1346   01/29/24 2200  fluconazole  (DIFLUCAN ) IVPB 400 mg  Status:  Discontinued        400 mg 200 mL/hr over 60 Minutes Intravenous Every 24 hours 01/29/24 1346 01/30/24 1533   01/25/24 1500  vancomycin  (VANCOREADY) IVPB 1500 mg/300 mL  Status:  Discontinued        1,500 mg 150 mL/hr over 120 Minutes Intravenous Every 24 hours 01/24/24 1322 01/30/24 1233   01/24/24 1115  vancomycin  (VANCOCIN ) 2,500 mg in sodium chloride  0.9 % 500 mL IVPB        2,500 mg 262.5 mL/hr over 120 Minutes Intravenous  Once 01/24/24 1029 01/24/24 1530   01/17/24 2245  piperacillin -tazobactam (ZOSYN ) IVPB 3.375 g  Status:  Discontinued        3.375 g 12.5 mL/hr over 240 Minutes Intravenous Every 8 hours 01/17/24 2149 01/30/24 1233   01/17/24 2200  fluconazole  (DIFLUCAN ) IVPB 400 mg  Status:  Discontinued        400 mg 100 mL/hr over 120 Minutes Intravenous Every 24 hours 01/17/24 2059 01/29/24 1336       Subjective: Seen and examined at bedside after his endoscopy and he was sleepy.  States that he was lightheaded and dizzy this morning but doing okay now.  No nausea or vomiting.  Had a very large bloody bowel movement early this morning.  No other concerns or complaints at this time.  Objective: Vitals:   02/11/24 1148 02/11/24 1151 02/11/24 1715 02/11/24 1941  BP: 90/69 101/67 114/71 111/77  Pulse: 82  90 86  Resp: 20  18 18   Temp: (!) 97.5 F (36.4 C)  97.9 F (36.6 C) 98 F (36.7 C)  TempSrc: Oral     SpO2: 98%  100% 100%  Weight:      Height:        Intake/Output Summary (Last 24  hours) at 02/11/2024 2017 Last data filed at 02/11/2024 1957 Gross per 24 hour  Intake 1663.63 ml  Output 2700 ml  Net -1036.37 ml   Filed Weights   01/22/24 0232 01/25/24 0452 01/29/24 0406  Weight: 135.7 kg 127.2 kg (!) 136.7 kg   Examination: Physical Exam:  Constitutional: WN/WD, obese chronically ill-appearing Caucasian male who is a little somnolent and drowsy Respiratory: Diminished to auscultation bilaterally, no wheezing, rales, rhonchi or crackles. Normal respiratory effort and patient is not tachypenic. No accessory muscle use.  Unlabored breathing Cardiovascular: RRR, no murmurs / rubs / gallops. S1 and S2 auscultated. No extremity edema Abdomen: Soft, non-tender, distended secondary body habitus. Bowel sounds positive.  GU: Deferred. Musculoskeletal: No clubbing / cyanosis of digits/nails. No joint deformity upper and lower  extremities.  Skin: No rashes, lesions, ulcers on limited skin evaluation. No induration; Warm and dry.  Neurologic: He is little somnolent and drowsy but arousable and cranial nerves II through XII grossly intact but he is slightly hard of hearing Psychiatric: He is calm .  When awoken he was alert and oriented  Data Reviewed: I have personally reviewed following labs and imaging studies  CBC: Recent Labs  Lab 02/08/24 0441 02/08/24 2250 02/09/24 0226 02/10/24 0244 02/11/24 0244 02/11/24 0904  WBC 5.9  --  6.1 6.1 5.7 10.4  NEUTROABS 4.6  --  4.6 4.6 4.4 9.1*  HGB 12.8* 12.3* 12.0* 11.8* 11.5* 10.3*  HCT 37.7* 36.5* 35.6* 34.7* 33.6* 29.8*  MCV 88.5  --  88.3 87.6 87.5 87.6  PLT 181  --  176 168 160 182   Basic Metabolic Panel: Recent Labs  Lab 02/07/24 0958 02/08/24 0441 02/09/24 0226 02/10/24 0244 02/11/24 0244  NA 135 135 136 136 136  K 4.3 3.8 3.6 3.9 3.5  CL 101 101 102 103 103  CO2 21* 24 23 22 23   GLUCOSE 136* 94 96 95 98  BUN 38* 40* 42* 38* 34*  CREATININE 1.37* 1.37* 1.37* 1.27* 1.17  CALCIUM  9.0 9.2 9.1 9.2 9.1  MG  2.1 2.2 2.2 2.1 2.0  PHOS 3.4 3.2 3.6 3.7 3.2   GFR: Estimated Creatinine Clearance: 82.9 mL/min (by C-G formula based on SCr of 1.17 mg/dL). Liver Function Tests: Recent Labs  Lab 02/07/24 0958 02/08/24 0441 02/09/24 0226 02/10/24 0244 02/11/24 0244  AST 20 19 18 16 19   ALT 25 25 22 20 22   ALKPHOS 80 75 75 73 74  BILITOT 0.5 0.5 0.5 0.6 0.5  PROT 6.6 6.5 6.4* 6.3* 6.4*  ALBUMIN 3.3* 3.3* 3.3* 3.3* 3.3*   No results for input(s): LIPASE, AMYLASE in the last 168 hours. No results for input(s): AMMONIA in the last 168 hours. Coagulation Profile: No results for input(s): INR, PROTIME in the last 168 hours. Cardiac Enzymes: No results for input(s): CKTOTAL, CKMB, CKMBINDEX, TROPONINI in the last 168 hours. BNP (last 3 results) No results for input(s): PROBNP in the last 8760 hours. HbA1C: No results for input(s): HGBA1C in the last 72 hours. CBG: Recent Labs  Lab 02/11/24 0747  GLUCAP 144*   Lipid Profile: No results for input(s): CHOL, HDL, LDLCALC, TRIG, CHOLHDL, LDLDIRECT in the last 72 hours. Thyroid  Function Tests: No results for input(s): TSH, T4TOTAL, FREET4, T3FREE, THYROIDAB in the last 72 hours. Anemia Panel: No results for input(s): VITAMINB12, FOLATE, FERRITIN, TIBC, IRON , RETICCTPCT in the last 72 hours. Sepsis Labs: No results for input(s): PROCALCITON, LATICACIDVEN in the last 168 hours.  No results found for this or any previous visit (from the past 240 hours).   Radiology Studies: CT ANGIO GI BLEED Result Date: 02/11/2024 CLINICAL DATA:  Gastrointestinal bleeding, negative EGD EXAM: CTA ABDOMEN AND PELVIS WITHOUT AND WITH CONTRAST TECHNIQUE: Multidetector CT imaging of the abdomen and pelvis was performed using the standard protocol during bolus administration of intravenous contrast. Multiplanar reconstructed images and MIPs were obtained and reviewed to evaluate the vascular anatomy. RADIATION  DOSE REDUCTION: This exam was performed according to the departmental dose-optimization program which includes automated exposure control, adjustment of the mA and/or kV according to patient size and/or use of iterative reconstruction technique. CONTRAST:  OMNIPAQUE  IOHEXOL  350 MG/ML SOLN COMPARISON:  02/03/2024 FINDINGS: VASCULAR Aorta: Normal caliber aorta without aneurysm, dissection, vasculitis or significant stenosis. Diffuse atherosclerosis. Celiac: Patent without evidence  of aneurysm, dissection, vasculitis or significant stenosis. Stable embolic coils within the gastroduodenal artery distribution from prior embolization procedure. SMA: Patent without evidence of aneurysm, dissection, vasculitis or significant stenosis. Mild atherosclerosis. Renals: Both renal arteries are patent without evidence of aneurysm, dissection, vasculitis, fibromuscular dysplasia or significant stenosis. Mild atherosclerosis. IMA: Patent without evidence of aneurysm, dissection, vasculitis or significant stenosis. Inflow: Patent without evidence of aneurysm, dissection, vasculitis or significant stenosis. Mild atherosclerosis. Proximal Outflow: Bilateral common femoral and visualized portions of the superficial and profunda femoral arteries are patent without evidence of aneurysm, dissection, vasculitis or significant stenosis. Veins: No obvious venous abnormality within the limitations of this arterial phase study. Review of the MIP images confirms the above findings. NON-VASCULAR Lower chest: Small left pleural effusion. No acute airspace disease. Hepatobiliary: No focal liver abnormality is seen. No gallstones, gallbladder wall thickening, or biliary dilatation. Pancreas: Unremarkable. No pancreatic ductal dilatation or surrounding inflammatory changes. Spleen: Normal in size without focal abnormality. Adrenals/Urinary Tract: Stable right adrenal myelolipoma. Left adrenal is unremarkable. Stable appearance of the bilateral  kidneys. No urinary tract calculi or obstruction. Evaluation of the bladder is limited due to decompression with a Foley catheter. There is persistent bladder wall thickening with multiple bladder diverticula compatible with sequela of chronic bladder outlet obstruction. Stomach/Bowel: No bowel obstruction or ileus. Normal appendix right lower quadrant. Diffuse colonic diverticulosis. Mild wall thickening and pericolonic fat stranding at the sigmoid colon has improved since prior study, consistent with resolving diverticulitis. No perforation, fluid collection, or abscess. No intraluminal contrast accumulation to suggest active gastrointestinal hemorrhage. Lymphatic: No pathologic adenopathy. Reproductive: Dense calcifications within the prostate. Other: No free fluid or free intraperitoneal gas. No abdominal wall hernia. Musculoskeletal: No acute or destructive bony abnormalities. Stable chronic L1 compression deformity. Reconstructed images demonstrate no additional findings. IMPRESSION: VASCULAR 1. No evidence of active gastrointestinal bleeding. 2.  Aortic Atherosclerosis (ICD10-I70.0). NON-VASCULAR 1. Colonic diverticulosis, with improving diverticulitis of the sigmoid colon. No perforation, fluid collection, or abscess. 2. Stable right adrenal myelolipoma. 3. Stable sequela of chronic bladder outlet obstruction. The bladder is decompressed with a Foley catheter. 4. Small left pleural effusion. Electronically Signed   By: Ozell Daring M.D.   On: 02/11/2024 15:30   Scheduled Meds:  budesonide -glycopyrrolate -formoterol   2 puff Inhalation BID   carbamide peroxide  5 drop Right EAR BID   Chlorhexidine  Gluconate Cloth  6 each Topical Daily   feeding supplement  237 mL Oral BID BM   Gerhardt's butt cream   Topical BID   multivitamin with minerals  1 tablet Oral Daily   mouth rinse  15 mL Mouth Rinse TID   pantoprazole  (PROTONIX ) IV  40 mg Intravenous Q12H   sucralfate   1 g Oral TID WC & HS   Continuous  Infusions:   LOS: 25 days   Alejandro Marker, DO [facility] Available via Epic secure chat 7am-7pm After these hours, please refer to coverage provider listed on [email or web address] 02/11/2024, 8:17 PM  "

## 2024-02-11 NOTE — Anesthesia Preprocedure Evaluation (Signed)
"                                    Anesthesia Evaluation  Patient identified by MRN, date of birth, ID band Patient awake    Reviewed: Allergy & Precautions, NPO status , Patient's Chart, lab work & pertinent test results, reviewed documented beta blocker date and time   Airway Mallampati: III  TM Distance: >3 FB Neck ROM: Full    Dental  (+) Dental Advisory Given, Missing, Poor Dentition   Pulmonary sleep apnea and Continuous Positive Airway Pressure Ventilation , COPD, former smoker Pulmonary mass   Pulmonary exam normal breath sounds clear to auscultation       Cardiovascular hypertension, Pt. on home beta blockers + dysrhythmias Atrial Fibrillation  Rhythm:Regular Rate:Tachycardia     Neuro/Psych TIACVA    GI/Hepatic PUD,,,(+)     substance abuse  alcohol useadmitted with upper GI bleed from duodenal bulb ulcer, which continued to bleed despite endoscopic therapies and IR embolization, eventually requiring ex lap, duodenotomy and ligation of the GDA and branches on January 17, 2024   Endo/Other    Class 3 obesity  Renal/GU negative Renal ROS     Musculoskeletal negative musculoskeletal ROS (+)    Abdominal   Peds  Hematology  (+) Blood dyscrasia (Eliquis ), anemia   Anesthesia Other Findings Day of surgery medications reviewed with the patient.  Reproductive/Obstetrics                              Anesthesia Physical Anesthesia Plan  ASA: 4  Anesthesia Plan: MAC   Post-op Pain Management: Minimal or no pain anticipated   Induction: Intravenous  PONV Risk Score and Plan: 1 and TIVA and Treatment may vary due to age or medical condition  Airway Management Planned: Natural Airway and Simple Face Mask  Additional Equipment:   Intra-op Plan:   Post-operative Plan:   Informed Consent: I have reviewed the patients History and Physical, chart, labs and discussed the procedure including the risks, benefits and  alternatives for the proposed anesthesia with the patient or authorized representative who has indicated his/her understanding and acceptance.     Dental advisory given  Plan Discussed with: CRNA  Anesthesia Plan Comments:         Anesthesia Quick Evaluation  "

## 2024-02-11 NOTE — Progress Notes (Signed)
  GASTROENTEROLOGY ROUNDING NOTE   Subjective: Patient experienced large volume maroon colored stool early this morning x 2 (03, 0 730) with associated drop in blood pressure and tachycardia.  Heparin  stopped at 0800. Patient feeling okay this morning.  Felt a little weak standing up.  No other new symptoms.   Hgb 11.5 this morning at 0300, but 10.3 at 0900.   Objective: Vital signs in last 24 hours: Temp:  [97.5 F (36.4 C)-98.4 F (36.9 C)] 97.5 F (36.4 C) (01/11 0920) Pulse Rate:  [79-108] 87 (01/11 0920) Resp:  [16-20] 20 (01/11 0920) BP: (92-127)/(59-79) 101/73 (01/11 0920) SpO2:  [94 %-97 %] 97 % (01/11 0920) Last BM Date : 02/11/24 General: NAD, Caucasian male Lungs:  CTA b/l, no w/r/r Heart:  RRR, no m/r/g Abdomen:  Soft, NT, ND, +BS    Intake/Output from previous day: 01/10 0701 - 01/11 0700 In: 3866.1 [P.O.:2050; I.V.:1816.1] Out: 3450 [Urine:3450] Intake/Output this shift: Total I/O In: -  Out: 450 [Emesis/NG output:150; Stool:300]   Lab Results: Recent Labs    02/10/24 0244 02/11/24 0244 02/11/24 0904  WBC 6.1 5.7 10.4  HGB 11.8* 11.5* 10.3*  PLT 168 160 182  MCV 87.6 87.5 87.6   BMET Recent Labs    02/09/24 0226 02/10/24 0244 02/11/24 0244  NA 136 136 136  K 3.6 3.9 3.5  CL 102 103 103  CO2 23 22 23   GLUCOSE 96 95 98  BUN 42* 38* 34*  CREATININE 1.37* 1.27* 1.17  CALCIUM  9.1 9.2 9.1   LFT Recent Labs    02/09/24 0226 02/10/24 0244 02/11/24 0244  PROT 6.4* 6.3* 6.4*  ALBUMIN 3.3* 3.3* 3.3*  AST 18 16 19   ALT 22 20 22   ALKPHOS 75 73 74  BILITOT 0.5 0.6 0.5   PT/INR No results for input(s): INR in the last 72 hours.    Imaging/Other results: No results found.    Assessment and Plan:  74 year old male with history of COPD, sleep apnea, stage IV lung cancer, history of A-fib, history of CVA admitted with upper GI bleed from duodenal bulb ulcer, which continued to bleed despite endoscopic therapies and IR  embolization, eventually requiring ex lap, duodenotomy and ligation of the GDA and branches on January 17, 2024. He experienced recurrent bleeding January 3 after restarting Eliquis  January 2.  Eliquis  was again stopped January 3 and he underwent a repeat upper endoscopy January 5 which showed post surgical changes in the duodenal bulb characterized by suture material and friable mucosa, but no ulcer and no evidence of recurrent or active bleeding.  He was restarted on a heparin  drip January 5 after the EGD  Patient has been oozing all week, but earlier this morning experienced a more significant bleed. My strong suspicion is that the patient is still bleeding from the surgical repair site in the duodenal bulb. Will plan to repeat upper endoscopy to assess this and look for evidence of active or recent bleeding to confirm this suspicion.  Although hemostatic interventions will likely be limited, at a minimum I can apply hemostatic gel or spray for temporary hemostasis.  If there is no evidence of recent GI bleeding on his upper endoscopy, then I think we should look for a second bleeding source.  Would recommend repeat CTA, and if this is negative, consider prepping for a colonoscopy.  Duodenal bulb ulcer with hemorrhage requiring surgical hemostasis, with rebleeding after initiation of Eliquis .  Intermittent bleeding and drifting of hemoglobin all week, now with significant  GI bleed - EGD 02/05/2024 without high risk bleeding lesions. - Hold heparin  - Rebolus Protonix  - N.p.o. - Plan for repeat EGD today (currently scheduled around 11am) to evaluate surgical site, as above - If EGD negative evidence of active or recent bleeding, repeat CTA   Atrial fibrillation - CHA2DS2-VASc greater than 4 - Systemic anticoagulation strongly recommended by cardiology - Will try to restart oral anticoagulation when patient has had no overt bleeding and stable for at least 2 days.   Right upper extremity DVT -  Noted on Doppler ultrasound 1229 - Additional indication for anticoagulation   Lung cancer - Treatment on hold given his hospitalization    Glendia FORBES Holt, MD  02/11/2024, 9:50 AM [facility]  Moderate complex medical decision making (this includes chart review, review of results, face-to-face time used for counseling as well as treatment plan and follow-up. The patient was provided an opportunity to ask questions and all were answered. The patient agreed with the plan and demonstrated an understanding of the instructions

## 2024-02-11 NOTE — Transfer of Care (Signed)
 Immediate Anesthesia Transfer of Care Note  Patient: Raymond Hurst  Procedure(s) Performed: EGD (ESOPHAGOGASTRODUODENOSCOPY)  Patient Location: PACU  Anesthesia Type:MAC  Level of Consciousness: sedated  Airway & Oxygen Therapy: Patient Spontanous Breathing and Patient connected to nasal cannula oxygen  Post-op Assessment: Report given to RN and Post -op Vital signs reviewed and stable  Post vital signs: Reviewed and stable  Last Vitals:  Vitals Value Taken Time  BP 88/57 02/11/24 10:56  Temp    Pulse 95 02/11/24 10:58  Resp 16 02/11/24 10:58  SpO2 86 % 02/11/24 10:58  Vitals shown include unfiled device data.  Last Pain:  Vitals:   02/11/24 1025  TempSrc:   PainSc: 0-No pain      Patients Stated Pain Goal: 0 (02/09/24 1128)  Complications: No notable events documented.

## 2024-02-11 NOTE — Plan of Care (Signed)
 Hgb trending down. NPO all day but pt excited about supper and regular diet. 2 BM with blood contents this am 0530 and 0730. GI workup in process. Did not attempt out of bed d/t dizziness and reoccurrence of bleeding.  Problem: Clinical Measurements: Goal: Ability to maintain clinical measurements within normal limits will improve Outcome: Not Progressing Goal: Diagnostic test results will improve Outcome: Not Progressing   Problem: Nutrition: Goal: Adequate nutrition will be maintained Outcome: Progressing

## 2024-02-11 NOTE — Progress Notes (Addendum)
 PHARMACY - ANTICOAGULATION CONSULT NOTE  Pharmacy Consult for heparin   Indication: atrial fibrillation and chronic RUE DVT  Allergies[1]  Patient Measurements: Height: 6' 0.01 (182.9 cm) Weight: (!) 136.7 kg (301 lb 5.9 oz) IBW/kg (Calculated) : 77.62 HEPARIN  DW (KG): 103.8  Vital Signs: Temp: 98.2 F (36.8 C) (01/11 0522) BP: 127/75 (01/11 0522) Pulse Rate: 79 (01/10 2001)  Labs: Recent Labs    02/09/24 0226 02/10/24 0244 02/11/24 0244  HGB 12.0* 11.8* 11.5*  HCT 35.6* 34.7* 33.6*  PLT 176 168 160  HEPARINUNFRC 0.44 0.48 0.53  CREATININE 1.37* 1.27* 1.17    Estimated Creatinine Clearance: 82.9 mL/min (by C-G formula based on SCr of 1.17 mg/dL).   Assessment: 66 YOM w/ Hx AF with an RUE DVT in need of AC. Apixaban  PTA. Last dose was >7 day ago. He was admitted to [facility] w/  duodenal ulcer hemorrhage which was clipped on 12/17.   Resuming heparin  s/p EGD, monitor on heparin  for a few days before/if resuming apixaban .  Heparin  level above therapeutic range at 0.53 on 2100 units/hr. Hgb slowly trending down, currently 11.5, platelets are normal. He has had intermittent bloody stools. GI discussed options with patient - recommended hold IV heparin . TRH to discuss with Cardiology.   Goal of Therapy:  Heparin  level 0.3-0.5 units/mL Monitor platelets by anticoagulation protocol: Yes   Plan:  Decrease heparin  infusion to 2000 units/hr Check heparin  level daily while on heparin  Continue to monitor H&H and platelets F/U GI plans  Thank you for involving pharmacy in this patient's care.  Delon Sax, PharmD, BCPS Clinical Pharmacist Clinical phone for 02/11/2024 is [phone] 02/11/2024 6:58 AM   Addendum: Heparin  drip stopped for bloody BM. Pharmacy will continue to follow.  Delon Sax, PharmD, BCPS 8:23 AM       [1] No Known Allergies

## 2024-02-11 NOTE — Op Note (Signed)
 [facility] Patient Name: Raymond Hurst Procedure Date : 02/11/2024 MRN: [id number] Attending MD: Glendia BRAVO. Stacia , MD, [id number] Date of Birth: 11/21/1950 CSN: [id number] Age: 74 Admit Type: Inpatient Procedure:                Upper GI endoscopy Indications:              Gastrointestinal bleeding of unknown origin;                            history of severe refractory bleeding from duodenal                            ulcer s/p embolization and eventual duodenotomy and                            GDA ligation with ongoing low grade bleeding for                            the past week, this morning experienced large                            volume maroon colored stool with associated                            hypotension. Providers:                Glendia BRAVO. Stacia, MD, Collene Edu, RN, Lorrayne Kitty, Technician Referring MD:              Medicines:                Monitored Anesthesia Care Complications:            No immediate complications. Estimated Blood Loss:     Estimated blood loss: none. Procedure:                Pre-Anesthesia Assessment:                           - Prior to the procedure, a History and Physical                            was performed, and patient medications and                            allergies were reviewed. The patient's tolerance of                            previous anesthesia was also reviewed. The risks                            and benefits of the procedure and the sedation                            options and  risks were discussed with the patient.                            All questions were answered, and informed consent                            was obtained. Prior Anticoagulants: The patient has                            taken heparin , last dose was day of procedure. ASA                            Grade Assessment: III - A patient with severe                            systemic disease.  After reviewing the risks and                            benefits, the patient was deemed in satisfactory                            condition to undergo the procedure.                           After obtaining informed consent, the endoscope was                            passed under direct vision. Throughout the                            procedure, the patient's blood pressure, pulse, and                            oxygen saturations were monitored continuously. The                            [id number]-H190 ([id number]) Olympus endoscope was introduced                            through the mouth, and advanced to the third part                            of duodenum. The upper GI endoscopy was                            accomplished without difficulty. The patient                            tolerated the procedure well. Scope In: Scope Out: Findings:      The examined portions of the nasopharynx, oropharynx and larynx were       normal.      The examined esophagus was normal.      Diffuse mildly erythematous mucosa without bleeding was found in the  gastric body.      The exam of the stomach was otherwise normal.      Suture material was found in the duodenal bulb. The surrounding mucosa       was erythematous and friable but there was no ulceration or evidence or       recent bleeding.      A large non-bleeding diverticulum was found in the second portion of the       duodenum. No evidence of recent bleeding was seen.      The exam of the duodenum was otherwise normal. Impression:               - The examined portions of the nasopharynx,                            oropharynx and larynx were normal.                           - Normal esophagus.                           - Erythematous mucosa in the gastric body.                           - S/p duodenotomy with intact suture and                            erythematous friable mucosa. There was no evidence                            of  recent bleeding from this area.                           - Non-bleeding duodenal diverticulum.                           - No blood or evidence of recent bleeding at all in                            the upper GI tract                           - Based on this exam, it seems very unlikely that                            the duodenotomy site is the source of bleeding and                            the patient has another bleeding source                            (diverticulum?). Moderate Sedation:      N/A Recommendation:           - Return patient to hospital ward for ongoing care.                           -  Clear liquid diet today.                           - Continue present medications.                           - Recommend repeat CT-A now. Further                            recommendations based on CT-A results                           - Continue to hold heparin  Procedure Code(s):        --- Professional ---                           [phone], Esophagogastroduodenoscopy, flexible,                            transoral; diagnostic, including collection of                            specimen(s) by brushing or washing, when performed                            (separate procedure) Diagnosis Code(s):        --- Professional ---                           K31.89, Other diseases of stomach and duodenum                           T18.3XXA, Foreign body in small intestine, initial                            encounter                           K92.2, Gastrointestinal hemorrhage, unspecified                           K57.10, Diverticulosis of small intestine without                            perforation or abscess without bleeding CPT copyright 2022 [employer]. All rights reserved. The codes documented in this report are preliminary and upon coder review may  be revised to meet current compliance requirements. Javeion Cannedy E. Stacia, MD 02/11/2024 11:12:25 AM This report has  been signed electronically. Number of Addenda: 0

## 2024-02-11 NOTE — Progress Notes (Signed)
 Patient's EGD and CTA both negative.  Patient has not had a bowel bowel movement since 730 this morning. As the patient's duodenotomy site does not appear to be the likely source of bleeding, this is most likely a stuttering diverticular bleed. I had a long conversation with the patient, his wife and his sister about what to do next.  Although the most likely bleeding source now appears to be a colonic diverticulum, we discussed the difficulty of finding and treating the culprit diverticulum during a colonoscopy. With the negative CTA a few hours after his most recent bloody bowel movement, it appears the bleeding has slowed or stopped for the moment.  Although we could pursue a colonoscopy tomorrow, I think the likelihood of finding and stopping the bleeding culprit is extremely low.  I did offer colonoscopy, but I told him the chances of achieving hemostasis are quite low and I was not sure it is worth the risk of sedation. I recommend we give the patient a longer period of heparin /anticoagulation.  The vast majority of diverticular bleeds resolve spontaneously without any intervention.  This may take several days.  If the patient exhibits recurrent bleeding after today off the heparin  drip, we will plan for a colonoscopy.  Dr. Charlanne will be taking over the inpatient GI service tomorrow.  Defer to him whether to proceed with a colonoscopy.

## 2024-02-12 ENCOUNTER — Encounter (HOSPITAL_COMMUNITY): Payer: Self-pay | Admitting: Gastroenterology

## 2024-02-12 DIAGNOSIS — K264 Chronic or unspecified duodenal ulcer with hemorrhage: Secondary | ICD-10-CM | POA: Diagnosis not present

## 2024-02-12 DIAGNOSIS — I482 Chronic atrial fibrillation, unspecified: Secondary | ICD-10-CM | POA: Diagnosis not present

## 2024-02-12 DIAGNOSIS — K922 Gastrointestinal hemorrhage, unspecified: Secondary | ICD-10-CM | POA: Diagnosis not present

## 2024-02-12 DIAGNOSIS — I82621 Acute embolism and thrombosis of deep veins of right upper extremity: Secondary | ICD-10-CM

## 2024-02-12 DIAGNOSIS — Z9189 Other specified personal risk factors, not elsewhere classified: Secondary | ICD-10-CM | POA: Diagnosis not present

## 2024-02-12 DIAGNOSIS — C3490 Malignant neoplasm of unspecified part of unspecified bronchus or lung: Secondary | ICD-10-CM | POA: Diagnosis not present

## 2024-02-12 DIAGNOSIS — I4891 Unspecified atrial fibrillation: Secondary | ICD-10-CM | POA: Diagnosis not present

## 2024-02-12 DIAGNOSIS — I82629 Acute embolism and thrombosis of deep veins of unspecified upper extremity: Secondary | ICD-10-CM | POA: Diagnosis not present

## 2024-02-12 LAB — CBC WITH DIFFERENTIAL/PLATELET
Abs Immature Granulocytes: 0.05 K/uL (ref 0.00–0.07)
Basophils Absolute: 0 K/uL (ref 0.0–0.1)
Basophils Relative: 0 %
Eosinophils Absolute: 0.2 K/uL (ref 0.0–0.5)
Eosinophils Relative: 3 %
HCT: 26.3 % — ABNORMAL LOW (ref 39.0–52.0)
Hemoglobin: 8.8 g/dL — ABNORMAL LOW (ref 13.0–17.0)
Immature Granulocytes: 1 %
Lymphocytes Relative: 7 %
Lymphs Abs: 0.5 K/uL — ABNORMAL LOW (ref 0.7–4.0)
MCH: 30.1 pg (ref 26.0–34.0)
MCHC: 33.5 g/dL (ref 30.0–36.0)
MCV: 90.1 fL (ref 80.0–100.0)
Monocytes Absolute: 0.5 K/uL (ref 0.1–1.0)
Monocytes Relative: 7 %
Neutro Abs: 5.5 K/uL (ref 1.7–7.7)
Neutrophils Relative %: 82 %
Platelets: 158 K/uL (ref 150–400)
RBC: 2.92 MIL/uL — ABNORMAL LOW (ref 4.22–5.81)
RDW: 15.7 % — ABNORMAL HIGH (ref 11.5–15.5)
WBC: 6.7 K/uL (ref 4.0–10.5)
nRBC: 0 % (ref 0.0–0.2)

## 2024-02-12 LAB — MAGNESIUM: Magnesium: 1.9 mg/dL (ref 1.7–2.4)

## 2024-02-12 LAB — HEMOGLOBIN AND HEMATOCRIT, BLOOD
HCT: 26.1 % — ABNORMAL LOW (ref 39.0–52.0)
Hemoglobin: 8.8 g/dL — ABNORMAL LOW (ref 13.0–17.0)

## 2024-02-12 LAB — COMPREHENSIVE METABOLIC PANEL WITH GFR
ALT: 18 U/L (ref 0–44)
AST: 16 U/L (ref 15–41)
Albumin: 3.1 g/dL — ABNORMAL LOW (ref 3.5–5.0)
Alkaline Phosphatase: 64 U/L (ref 38–126)
Anion gap: 10 (ref 5–15)
BUN: 33 mg/dL — ABNORMAL HIGH (ref 8–23)
CO2: 20 mmol/L — ABNORMAL LOW (ref 22–32)
Calcium: 8.6 mg/dL — ABNORMAL LOW (ref 8.9–10.3)
Chloride: 103 mmol/L (ref 98–111)
Creatinine, Ser: 1.34 mg/dL — ABNORMAL HIGH (ref 0.61–1.24)
GFR, Estimated: 57 mL/min — ABNORMAL LOW
Glucose, Bld: 129 mg/dL — ABNORMAL HIGH (ref 70–99)
Potassium: 4 mmol/L (ref 3.5–5.1)
Sodium: 134 mmol/L — ABNORMAL LOW (ref 135–145)
Total Bilirubin: 0.4 mg/dL (ref 0.0–1.2)
Total Protein: 5.8 g/dL — ABNORMAL LOW (ref 6.5–8.1)

## 2024-02-12 LAB — PHOSPHORUS: Phosphorus: 2.9 mg/dL (ref 2.5–4.6)

## 2024-02-12 LAB — HEPARIN LEVEL (UNFRACTIONATED): Heparin Unfractionated: <0.1 [IU]/mL — ABNORMAL LOW (ref 0.30–0.70)

## 2024-02-12 MED ORDER — SODIUM CHLORIDE (PF) 0.9 % IJ SOLN
INTRAMUSCULAR | Status: AC
  Filled 2024-02-12: qty 10

## 2024-02-12 NOTE — Progress Notes (Signed)
 Physical Therapy Treatment  Patient Details Name: Raymond Hurst MRN: [id number] DOB: 1950-07-03 Today's Date: 02/12/2024   History of Present Illness 74 y.o. male admitted to [facility] 01/16/24 with transfer to [facility] ICU on 12/17 with dizziness, hematemesis, blood loss anemia, hemorrhagic shock. EGD 12/17 with duodenal active bleed, clipped; same day emergent ex lap and wound vac placement. S/p abdominal closure 12/19. ETT 12/17-12/23. RUE SVT and DVT noted 12/29. Off TPN 12/31; JP drain removed 12/31. Of note, recent admission 01/06/24-01/08/24 with GIB. Other PMH includes afib on Eliquis , CVA, recurrent GIB, stage IV lung CA (dx 06/2023), gout, previous ETOH abuse.    PT Comments  Pt progressing towards physical therapy goals. Pt reports after last PT session he and wife had a conversation regarding d/c plan and they both prefer post-acute rehab at the SNF level. Feel this is reasonable given functional decline from baseline, and to maximize functional independence, safety, and decrease risk for falls prior to return home at d/c. Will continue to follow.     If plan is discharge home, recommend the following: A little help with bathing/dressing/bathroom;Assist for transportation;Assistance with cooking/housework;A little help with walking and/or transfers   Can travel by private vehicle     Yes  Equipment Recommendations  BSC/3in1    Recommendations for Other Services       Precautions / Restrictions Precautions Precautions: Fall Recall of Precautions/Restrictions: Intact Restrictions Weight Bearing Restrictions Per Provider Order: No     Mobility  Bed Mobility Overal bed mobility: Modified Independent             General bed mobility comments: Pt in chair on arrival. Pt didnt need help to get into bed at end of treatment.    Transfers Overall transfer level: Needs assistance Equipment used: Rolling walker (2 wheels) Transfers: Sit to/from Stand Sit to Stand: Contact guard  assist           General transfer comment: VC's for hand placement on seated surface for safety. Pt was able to power up to full stand without assist but pulling from walker to do so. Pt with uncontrolled descent back to sitting. Again, educated on hand placement on seated surface.    Ambulation/Gait Ambulation/Gait assistance: Contact guard assist Gait Distance (Feet): 125 Feet Assistive device: Rolling walker (2 wheels) Gait Pattern/deviations: Step-through pattern, Decreased stride length, Trunk flexed Gait velocity: Decreased Gait velocity interpretation: <1.31 ft/sec, indicative of household ambulator   General Gait Details: Slow but generally steady with RW for support. Pt asking to use rollator however declining one for home. Encouraged pt to use whatever device he plans to use when he returns home and pt decided on RW.   Stairs             Wheelchair Mobility     Tilt Bed    Modified Rankin (Stroke Patients Only)       Balance Overall balance assessment: Needs assistance Sitting-balance support: Feet supported Sitting balance-Leahy Scale: Good     Standing balance support: No upper extremity supported, During functional activity Standing balance-Leahy Scale: Fair                              Musician Communication: Impaired Factors Affecting Communication: Hearing impaired  Cognition Arousal: Alert Behavior During Therapy: WFL for tasks assessed/performed   PT - Cognitive impairments: No apparent impairments  PT - Cognition Comments: WFL for simple tasks, not formally assessed Following commands: Intact      Cueing Cueing Techniques: Verbal cues  Exercises General Exercises - Lower Extremity Long Arc Quad: 10 reps, Both, Supine Heel Slides: 10 reps, Both, Supine    General Comments        Pertinent Vitals/Pain Pain Assessment Pain Assessment: No/denies pain    Home Living                           Prior Function            PT Goals (current goals can now be found in the care plan section) Acute Rehab PT Goals Patient Stated Goal: to improve mobiltiy Progress towards PT goals: Progressing toward goals    Frequency    Min 2X/week      PT Plan      Co-evaluation              AM-PAC PT 6 Clicks Mobility   Outcome Measure  Help needed turning from your back to your side while in a flat bed without using bedrails?: None Help needed moving from lying on your back to sitting on the side of a flat bed without using bedrails?: None Help needed moving to and from a bed to a chair (including a wheelchair)?: A Little Help needed standing up from a chair using your arms (e.g., wheelchair or bedside chair)?: A Little Help needed to walk in hospital room?: A Little Help needed climbing 3-5 steps with a railing? : A Little 6 Click Score: 20    End of Session Equipment Utilized During Treatment: Gait belt Activity Tolerance: Patient tolerated treatment well Patient left: in chair;with call bell/phone within reach;with chair alarm set Nurse Communication: Mobility status PT Visit Diagnosis: Unsteadiness on feet (R26.81);Other abnormalities of gait and mobility (R26.89);Muscle weakness (generalized) (M62.81)     Time: 8885-8867 PT Time Calculation (min) (ACUTE ONLY): 18 min  Charges:    $Gait Training: 23-37 mins PT General Charges $$ ACUTE PT VISIT: 1 Visit                     Leita Sable, PT, DPT Acute Rehabilitation Services Secure Chat Preferred Office: [phone]    Leita JONETTA Sable 02/12/2024, 11:46 AM

## 2024-02-12 NOTE — Progress Notes (Signed)
 "  Progress Note  Patient Name: Raymond Hurst Date of Encounter: 02/12/2024  Primary Cardiologist: Jayson Sierras, MD   Subjective   I had an extensive discussion at bedside with the patient and his wife.  They are very concerned about recurrent bleeding though he does require Eliquis  for his A-fib and DVT.  They understand the risks of stroke and PE if holding anticoagulation however recurrent risk of bleeding is very high while being on anticoagulation.  He is also having some dizziness today which he gets during episodes of bleeding.  Inpatient Medications    Scheduled Meds:  budesonide -glycopyrrolate -formoterol   2 puff Inhalation BID   carbamide peroxide  5 drop Right EAR BID   Chlorhexidine  Gluconate Cloth  6 each Topical Daily   feeding supplement  237 mL Oral BID BM   Gerhardt's butt cream   Topical BID   multivitamin with minerals  1 tablet Oral Daily   mouth rinse  15 mL Mouth Rinse TID   pantoprazole  (PROTONIX ) IV  40 mg Intravenous Q12H   sucralfate   1 g Oral TID WC & HS   Continuous Infusions:  PRN Meds: acetaminophen , hydrALAZINE , HYDROcodone -acetaminophen , hydrOXYzine , ipratropium-albuterol , labetalol , ondansetron  (ZOFRAN ) IV, sodium chloride  flush   Vital Signs    Vitals:   02/12/24 0352 02/12/24 0809 02/12/24 0849 02/12/24 1521  BP: 108/74  109/65 128/83  Pulse: 79  83 91  Resp: 18  18   Temp: 97.6 F (36.4 C)  98.6 F (37 C)   TempSrc:      SpO2: 99% 97% 99%   Weight:      Height:        Intake/Output Summary (Last 24 hours) at 02/12/2024 1621 Last data filed at 02/11/2024 2200 Gross per 24 hour  Intake 676.4 ml  Output 600 ml  Net 76.4 ml   Filed Weights   01/22/24 0232 01/25/24 0452 01/29/24 0406  Weight: 135.7 kg 127.2 kg (!) 136.7 kg    Telemetry      ECG      Physical Exam   Physical Exam Vitals and nursing note reviewed.  Constitutional:      Appearance: Normal appearance.  HENT:     Head: Normocephalic and atraumatic.   Eyes:     Conjunctiva/sclera: Conjunctivae normal.  Cardiovascular:     Rate and Rhythm: Normal rate. Rhythm irregular.  Pulmonary:     Effort: Pulmonary effort is normal.     Breath sounds: Normal breath sounds.  Musculoskeletal:        General: No swelling or tenderness.  Skin:    Coloration: Skin is not jaundiced or pale.  Neurological:     Mental Status: He is alert.      Labs    Chemistry Recent Labs  Lab 02/10/24 0244 02/11/24 0244 02/12/24 1007  NA 136 136 134*  K 3.9 3.5 4.0  CL 103 103 103  CO2 22 23 20*  GLUCOSE 95 98 129*  BUN 38* 34* 33*  CREATININE 1.27* 1.17 1.34*  CALCIUM  9.2 9.1 8.6*  PROT 6.3* 6.4* 5.8*  ALBUMIN 3.3* 3.3* 3.1*  AST 16 19 16   ALT 20 22 18   ALKPHOS 73 74 64  BILITOT 0.6 0.5 0.4  GFRNONAA >60 >60 57*  ANIONGAP 11 11 10      Hematology Recent Labs  Lab 02/11/24 0904 02/11/24 2157 02/12/24 1007  WBC 10.4 8.3 6.7  RBC 3.40* 3.09* 2.92*  HGB 10.3* 9.5* 8.8*  HCT 29.8* 28.2* 26.3*  MCV 87.6 91.3  90.1  MCH 30.3 30.7 30.1  MCHC 34.6 33.7 33.5  RDW 15.1 15.5 15.7*  PLT 182 164 158    Cardiac EnzymesNo results for input(s): TROPONINI in the last 168 hours. No results for input(s): TROPIPOC in the last 168 hours.   BNPNo results for input(s): BNP, PROBNP in the last 168 hours.   DDimer No results for input(s): DDIMER in the last 168 hours.   Radiology    CT ANGIO GI BLEED Result Date: 02/11/2024 CLINICAL DATA:  Gastrointestinal bleeding, negative EGD EXAM: CTA ABDOMEN AND PELVIS WITHOUT AND WITH CONTRAST TECHNIQUE: Multidetector CT imaging of the abdomen and pelvis was performed using the standard protocol during bolus administration of intravenous contrast. Multiplanar reconstructed images and MIPs were obtained and reviewed to evaluate the vascular anatomy. RADIATION DOSE REDUCTION: This exam was performed according to the departmental dose-optimization program which includes automated exposure control, adjustment  of the mA and/or kV according to patient size and/or use of iterative reconstruction technique. CONTRAST:  OMNIPAQUE  IOHEXOL  350 MG/ML SOLN COMPARISON:  02/03/2024 FINDINGS: VASCULAR Aorta: Normal caliber aorta without aneurysm, dissection, vasculitis or significant stenosis. Diffuse atherosclerosis. Celiac: Patent without evidence of aneurysm, dissection, vasculitis or significant stenosis. Stable embolic coils within the gastroduodenal artery distribution from prior embolization procedure. SMA: Patent without evidence of aneurysm, dissection, vasculitis or significant stenosis. Mild atherosclerosis. Renals: Both renal arteries are patent without evidence of aneurysm, dissection, vasculitis, fibromuscular dysplasia or significant stenosis. Mild atherosclerosis. IMA: Patent without evidence of aneurysm, dissection, vasculitis or significant stenosis. Inflow: Patent without evidence of aneurysm, dissection, vasculitis or significant stenosis. Mild atherosclerosis. Proximal Outflow: Bilateral common femoral and visualized portions of the superficial and profunda femoral arteries are patent without evidence of aneurysm, dissection, vasculitis or significant stenosis. Veins: No obvious venous abnormality within the limitations of this arterial phase study. Review of the MIP images confirms the above findings. NON-VASCULAR Lower chest: Small left pleural effusion. No acute airspace disease. Hepatobiliary: No focal liver abnormality is seen. No gallstones, gallbladder wall thickening, or biliary dilatation. Pancreas: Unremarkable. No pancreatic ductal dilatation or surrounding inflammatory changes. Spleen: Normal in size without focal abnormality. Adrenals/Urinary Tract: Stable right adrenal myelolipoma. Left adrenal is unremarkable. Stable appearance of the bilateral kidneys. No urinary tract calculi or obstruction. Evaluation of the bladder is limited due to decompression with a Foley catheter. There is persistent  bladder wall thickening with multiple bladder diverticula compatible with sequela of chronic bladder outlet obstruction. Stomach/Bowel: No bowel obstruction or ileus. Normal appendix right lower quadrant. Diffuse colonic diverticulosis. Mild wall thickening and pericolonic fat stranding at the sigmoid colon has improved since prior study, consistent with resolving diverticulitis. No perforation, fluid collection, or abscess. No intraluminal contrast accumulation to suggest active gastrointestinal hemorrhage. Lymphatic: No pathologic adenopathy. Reproductive: Dense calcifications within the prostate. Other: No free fluid or free intraperitoneal gas. No abdominal wall hernia. Musculoskeletal: No acute or destructive bony abnormalities. Stable chronic L1 compression deformity. Reconstructed images demonstrate no additional findings. IMPRESSION: VASCULAR 1. No evidence of active gastrointestinal bleeding. 2.  Aortic Atherosclerosis (ICD10-I70.0). NON-VASCULAR 1. Colonic diverticulosis, with improving diverticulitis of the sigmoid colon. No perforation, fluid collection, or abscess. 2. Stable right adrenal myelolipoma. 3. Stable sequela of chronic bladder outlet obstruction. The bladder is decompressed with a Foley catheter. 4. Small left pleural effusion. Electronically Signed   By: Ozell Daring M.D.   On: 02/11/2024 15:30    Cardiac Studies   ECHO 05/06/19:   1. Left ventricular ejection fraction, by estimation, is 55  to 60%. The  left ventricle has normal function. The left ventricle has no regional  wall motion abnormalities. There is mild left ventricular hypertrophy.  Left ventricular diastolic parameters  are indeterminate.   2. Right ventricular systolic function is normal. The right ventricular  size is normal.   3. The mitral valve is normal in structure. Trivial mitral valve  regurgitation. No evidence of mitral stenosis.   4. The aortic valve has an indeterminant number of cusps. Aortic valve   regurgitation is not visualized. No aortic stenosis is present.   5. Aortic dilatation noted. There is mild to moderate dilatation of the  aortic root measuring 43 mm.   6. Indeterminant PASP, inadequate TR jet.   7. The inferior vena cava is normal in size with greater than 50%  respiratory variability, suggesting right atrial pressure of 3 mmHg.   Patient Profile     74 y.o. male with a history of squamous cell carcinoma 06/2023, permanent atrial fibrillation, OSA, COPD, lymphedema, recurrent GI bleed and hemorrhagic shock in December 2025 DVT of right brachial vein, superficial vein thrombosis of right cephalic vein and TIA who allergy was initially consulted on 02/02/2024 to evaluate for chronic anticoagulation therapy.  He had recurrent GI bleeding January 3 after restarting Eliquis  on January 2 which was subsequently held.  He was restarted on a heparin  drip on January 5 then had a significant bleed on January 11.  Cardiology was reconsulted on 02/12/2024 at the family's request to address anticoagulation needs.    Assessment & Plan   Permanent atrial fibrillation with high risk of major bleeding on chronic anticoagulation- CHA2DS2-VASc: 4 (hypertension, stroke, age; 4.8% annual risk of stroke), HAS-BLED: 4 (high risk of major bleeding with anticoagulation).  We had an extensive discussion today at bedside.  Patient has decided to hold off on further anticoagulation for several days, especially with his downtrending hemoglobin to allow for his GI system to heal.  He then agrees to trialing heparin .  If this is tolerated for several days then would retrial Eliquis .  If he has recurrent bleeding after restarting Eliquis  at that time then he would like to hold off on any further anticoagulation indefinitely and understands that he has a 4.8% risk of stroke per year and his DVT will not be treated which runs the risk of pulmonary embolism.  Also, while he could consider outpatient watchman  implantation, in order to undergo a Watchman left atrial appendage occlusion device for stroke prophylaxis, he would need to demonstrate tolerability of anticoagulation as he would require at least 45 days of uninterrupted anticoagulation post watchman or 6 months of dual antiplatelet therapy followed by single anti-platelet therapy. Recurrent GI bleeding with duodenal ulcer hemorrhage and coiling and rebleeding after reinitiation of Eliquis  Right upper extremity deep and superficial vein thrombosis Lung cancer Anemia    [facility] will sign off.     For questions or updates, please contact [facility] Please consult [email or web address] for contact info under        Signed, Emeline Calender, DO 02/12/2024, 4:21 PM    "

## 2024-02-12 NOTE — Plan of Care (Signed)
" °  Problem: Health Behavior/Discharge Planning: Goal: Ability to manage health-related needs will improve Outcome: Progressing   Problem: Clinical Measurements: Goal: Ability to maintain clinical measurements within normal limits will improve Outcome: Progressing Goal: Will remain free from infection Outcome: Progressing Goal: Diagnostic test results will improve Outcome: Progressing Goal: Respiratory complications will improve Outcome: Progressing Goal: Cardiovascular complication will be avoided Outcome: Progressing   Problem: Activity: Goal: Risk for activity intolerance will decrease Outcome: Progressing   Problem: Nutrition: Goal: Adequate nutrition will be maintained Outcome: Progressing   Problem: Coping: Goal: Level of anxiety will decrease Outcome: Progressing   Problem: Elimination: Goal: Will not experience complications related to bowel motility Outcome: Progressing Goal: Will not experience complications related to urinary retention Outcome: Progressing   Problem: Pain Managment: Goal: General experience of comfort will improve and/or be controlled Outcome: Progressing   Problem: Safety: Goal: Ability to remain free from injury will improve Outcome: Progressing   Problem: Skin Integrity: Goal: Risk for impaired skin integrity will decrease Outcome: Progressing   Problem: Education: Goal: Ability to describe self-care measures that may prevent or decrease complications (Diabetes Survival Skills Education) will improve Outcome: Progressing Goal: Individualized Educational Video(s) Outcome: Progressing   Problem: Coping: Goal: Ability to adjust to condition or change in health will improve Outcome: Progressing   Problem: Fluid Volume: Goal: Ability to maintain a balanced intake and output will improve Outcome: Progressing   Problem: Health Behavior/Discharge Planning: Goal: Ability to identify and utilize available resources and services will  improve Outcome: Progressing Goal: Ability to manage health-related needs will improve Outcome: Progressing   Problem: Metabolic: Goal: Ability to maintain appropriate glucose levels will improve Outcome: Progressing   Problem: Nutritional: Goal: Maintenance of adequate nutrition will improve Outcome: Progressing Goal: Progress toward achieving an optimal weight will improve Outcome: Progressing   Problem: Skin Integrity: Goal: Risk for impaired skin integrity will decrease Outcome: Progressing   Problem: Tissue Perfusion: Goal: Adequacy of tissue perfusion will improve Outcome: Progressing   Problem: Education: Goal: Knowledge of the prescribed therapeutic regimen will improve Outcome: Progressing   Problem: Bowel/Gastric: Goal: Gastrointestinal status for postoperative course will improve Outcome: Progressing   Problem: Cardiac: Goal: Ability to maintain an adequate cardiac output Outcome: Progressing Goal: Will show no evidence of cardiac arrhythmias Outcome: Progressing   Problem: Nutritional: Goal: Will attain and maintain optimal nutritional status Outcome: Progressing   Problem: Neurological: Goal: Will regain or maintain usual level of consciousness Outcome: Progressing   Problem: Clinical Measurements: Goal: Ability to maintain clinical measurements within normal limits Outcome: Progressing Goal: Postoperative complications will be avoided or minimized Outcome: Progressing   Problem: Respiratory: Goal: Will regain and/or maintain adequate ventilation Outcome: Progressing Goal: Respiratory status will improve Outcome: Progressing   Problem: Skin Integrity: Goal: Demonstrates signs of wound healing without infection Outcome: Progressing   Problem: Urinary Elimination: Goal: Will remain free from infection Outcome: Progressing Goal: Ability to achieve and maintain adequate urine output Outcome: Progressing   "

## 2024-02-12 NOTE — Progress Notes (Addendum)
 "    [facility]st [facility] Progress Note  CC:   GI bleed  Subjective:  No further sign of bleeding, no BM/passing of blood since yesterday AM.  Tolerating a regular diet.  No pain, no abdominal pain.  Objective:  Vital signs in last 24 hours: Temp:  [97.5 F (36.4 C)-98.6 F (37 C)] 98.6 F (37 C) (01/12 0849) Pulse Rate:  [79-97] 83 (01/12 0849) Resp:  [16-20] 18 (01/12 0849) BP: (88-114)/(55-77) 109/65 (01/12 0849) SpO2:  [89 %-100 %] 99 % (01/12 0849) Last BM Date : 02/11/24 General:  Alert, Well-developed, in NAD Heart:  Regular rate and rhythm; no murmurs Pulm:  CTAB.  No W/R/R. Abdomen:  Soft, non-distended.  BS present.  Dressing noted over mid-line incision.  Appropriate TTP. Extremities:  Without edema. Neurologic:  Alert and oriented x 4;  grossly normal neurologically. Psych:  Alert and cooperative. Normal mood and affect.  Intake/Output from previous day: 01/11 0701 - 01/12 0700 In: 800 [P.O.:300; I.V.:100; IV Piggyback:400] Out: 1600 [Urine:1150; Emesis/NG output:150; Stool:300]   Lab Results: Recent Labs    02/11/24 0244 02/11/24 0904 02/11/24 2157  WBC 5.7 10.4 8.3  HGB 11.5* 10.3* 9.5*  HCT 33.6* 29.8* 28.2*  PLT 160 182 164   BMET Recent Labs    02/10/24 0244 02/11/24 0244  NA 136 136  K 3.9 3.5  CL 103 103  CO2 22 23  GLUCOSE 95 98  BUN 38* 34*  CREATININE 1.27* 1.17  CALCIUM  9.2 9.1   LFT Recent Labs    02/11/24 0244  PROT 6.4*  ALBUMIN 3.3*  AST 19  ALT 22  ALKPHOS 74  BILITOT 0.5   CT ANGIO GI BLEED Result Date: 02/11/2024 CLINICAL DATA:  Gastrointestinal bleeding, negative EGD EXAM: CTA ABDOMEN AND PELVIS WITHOUT AND WITH CONTRAST TECHNIQUE: Multidetector CT imaging of the abdomen and pelvis was performed using the standard protocol during bolus administration of intravenous contrast. Multiplanar reconstructed images and MIPs were obtained and reviewed to evaluate the vascular anatomy. RADIATION DOSE REDUCTION: This exam  was performed according to the departmental dose-optimization program which includes automated exposure control, adjustment of the mA and/or kV according to patient size and/or use of iterative reconstruction technique. CONTRAST:  OMNIPAQUE  IOHEXOL  350 MG/ML SOLN COMPARISON:  02/03/2024 FINDINGS: VASCULAR Aorta: Normal caliber aorta without aneurysm, dissection, vasculitis or significant stenosis. Diffuse atherosclerosis. Celiac: Patent without evidence of aneurysm, dissection, vasculitis or significant stenosis. Stable embolic coils within the gastroduodenal artery distribution from prior embolization procedure. SMA: Patent without evidence of aneurysm, dissection, vasculitis or significant stenosis. Mild atherosclerosis. Renals: Both renal arteries are patent without evidence of aneurysm, dissection, vasculitis, fibromuscular dysplasia or significant stenosis. Mild atherosclerosis. IMA: Patent without evidence of aneurysm, dissection, vasculitis or significant stenosis. Inflow: Patent without evidence of aneurysm, dissection, vasculitis or significant stenosis. Mild atherosclerosis. Proximal Outflow: Bilateral common femoral and visualized portions of the superficial and profunda femoral arteries are patent without evidence of aneurysm, dissection, vasculitis or significant stenosis. Veins: No obvious venous abnormality within the limitations of this arterial phase study. Review of the MIP images confirms the above findings. NON-VASCULAR Lower chest: Small left pleural effusion. No acute airspace disease. Hepatobiliary: No focal liver abnormality is seen. No gallstones, gallbladder wall thickening, or biliary dilatation. Pancreas: Unremarkable. No pancreatic ductal dilatation or surrounding inflammatory changes. Spleen: Normal in size without focal abnormality. Adrenals/Urinary Tract: Stable right adrenal myelolipoma. Left adrenal is unremarkable. Stable appearance of the bilateral kidneys. No urinary tract  calculi or obstruction. Evaluation  of the bladder is limited due to decompression with a Foley catheter. There is persistent bladder wall thickening with multiple bladder diverticula compatible with sequela of chronic bladder outlet obstruction. Stomach/Bowel: No bowel obstruction or ileus. Normal appendix right lower quadrant. Diffuse colonic diverticulosis. Mild wall thickening and pericolonic fat stranding at the sigmoid colon has improved since prior study, consistent with resolving diverticulitis. No perforation, fluid collection, or abscess. No intraluminal contrast accumulation to suggest active gastrointestinal hemorrhage. Lymphatic: No pathologic adenopathy. Reproductive: Dense calcifications within the prostate. Other: No free fluid or free intraperitoneal gas. No abdominal wall hernia. Musculoskeletal: No acute or destructive bony abnormalities. Stable chronic L1 compression deformity. Reconstructed images demonstrate no additional findings. IMPRESSION: VASCULAR 1. No evidence of active gastrointestinal bleeding. 2.  Aortic Atherosclerosis (ICD10-I70.0). NON-VASCULAR 1. Colonic diverticulosis, with improving diverticulitis of the sigmoid colon. No perforation, fluid collection, or abscess. 2. Stable right adrenal myelolipoma. 3. Stable sequela of chronic bladder outlet obstruction. The bladder is decompressed with a Foley catheter. 4. Small left pleural effusion. Electronically Signed   By: Ozell Daring M.D.   On: 02/11/2024 15:30   Assessment / Plan: 74 year old male with history of COPD, sleep apnea, stage IV lung cancer, history of A-fib, history of CVA admitted with upper GI bleed from duodenal bulb ulcer, which continued to bleed despite endoscopic therapies and IR embolization, eventually requiring ex lap, duodenotomy and ligation of the GDA and branches on January 17, 2024. He experienced recurrent bleeding January 3 after restarting Eliquis  January 2.  Eliquis  was again stopped January 3  and he underwent a repeat upper endoscopy January 5 which showed post surgical changes in the duodenal bulb characterized by suture material and friable mucosa, but no ulcer and no evidence of recurrent or active bleeding.  He was restarted on a heparin  drip January 5 after the EGD.   Patient had been oozing all week, but earlier the morning of 1/11 he experienced a more significant bleed.  Repeat EGD 1/11 showed the duodenotomy site looked pretty good with zero evidence of recent bleeding.  Although this seems extremely unlikely, it was thought that he could be having bleeding from a secondary source, most likely colonic diverticulosis (he has a history of diverticular bleeding from a few years ago).  We repeated a CTA, but it was negative (had been negative on January 3 as well).  We discussed doing a colonoscopy, but given the negative CTA and the fact that his bleeding had resolved over the course of the day, we felt like this was going to be a very low likelihood of finding and treating the bleeding source.  But if he has any more bleeding, may need to do colonoscopy.  He may just need to have his anticoagulation held for longer period to allow hemostasis.   Duodenal bulb ulcer with hemorrhage requiring surgical hemostasis, with rebleeding after initiation of Eliquis .  Intermittent bleeding and drifting of hemoglobin all week, now with significant GI bleed - EGD 02/05/2024 without high risk bleeding lesions and the same with EGD on 02/11/2024. -- CTA negative on 02/11/2024 as well. -- Hgb down to 9.5 grams this AM from 10.3 grams yesterday.   Atrial fibrillation - CHA2DS2-VASc greater than 4 - Systemic anticoagulation strongly recommended by cardiology - Will try to restart oral anticoagulation when patient has had no overt bleeding and stable for at least 2 days.   Right upper extremity DVT - Noted on Doppler ultrasound 12/29 - Additional indication for anticoagulation   Lung cancer -  Treatment on hold given his hospitalization  **No further sign of bleeding at this point.  Hgb down slightly but could be equilibration/dilutional.  Will plan to continue with observation for now.  Heparin  has remained on hold.  ? How much longer should we/could we hold before restarting.     LOS: 26 days   Raymond Hurst  02/12/2024, 9:43 AM    Attending physician's note   I have taken history, reviewed the chart and examined the patient. I performed a substantive portion of this encounter, including complete performance of at least one of the key components, in conjunction with the APP. I agree with the Advanced Practitioner's note, impression and recommendations.   Got sign out from Dr Stacia. Chart reviewed and discussed with pt's wife over the phone. Pt does feel better and tolerating PO. No further bleeding but Hb drifting down gradually 10.3 to 9.5 to 8.8 Plan is to observe and not pt through rpt endo procedures or colon unless active bleeding. Hold eliquis  for now   Addendum: Got called by Dr Sherrill (hospitalist service), that pt's wife would like to get another GI opinion and switch GI providers. She had called Dr. Janett office and was accepted per nursing secure chat message. Pl contact EGI.  D/W Dr Sherrill Will sign off.   Anselm Bring, MD [facility] [phone]     "

## 2024-02-12 NOTE — TOC Progression Note (Signed)
 Transition of Care [facility]) - Progression Note    Patient Details  Name: Raymond Hurst MRN: [id number] Date of Birth: 07-18-1950  [facility] of [facility]) CM/SW Contact  Lendia Dais, [place] Phone Number: 02/12/2024, 3:08 PM  Clinical Narrative:  Pt is not yet medically stable. Pt is still having GI bleeds and is off heparin . Plan is SNF at [facility]. CSW will need to check for bed availability and start insurance auth once medical stability approaches.  CSW will continue to follow for DC.       Barriers to Discharge: Continued Medical Work up               Expected Discharge Plan and Services                                               Social Drivers of Health (SDOH) Interventions SDOH Screenings   Food Insecurity: No Food Insecurity (01/16/2024)  Housing: Low Risk (01/26/2024)  Transportation Needs: No Transportation Needs (01/16/2024)  Utilities: Not At Risk (01/16/2024)  Alcohol Screen: Low Risk (06/15/2023)  Depression (PHQ2-9): Low Risk (01/12/2024)  Financial Resource Strain: High Risk (06/15/2023)  Physical Activity: Inactive (06/15/2023)  Social Connections: Moderately Isolated (01/16/2024)  Stress: No Stress Concern Present (06/15/2023)  Tobacco Use: Medium Risk (02/11/2024)    Readmission Risk Interventions    09/08/2023   12:09 PM  Readmission Risk Prevention Plan  Transportation Screening Complete  HRI or Home Care Consult Complete  Social Work Consult for Recovery Care Planning/Counseling Complete  Palliative Care Screening Not Applicable  Medication Review Oceanographer) Complete

## 2024-02-12 NOTE — Progress Notes (Signed)
 " PROGRESS NOTE    Raymond Hurst  FMW:[id number] DOB: Apr 01, 1950 DOA: 01/16/2024 PCP: [name], MD   Brief Narrative:  74 year old male Left lung mass bronchogenic CA (poorly differentiated squamous cell) diagnosed 06/05/2023-bronchoscopy 08/09/2023 complicated by left PNA thorax-follows with Dr. Arlester carboplatin  and Taxol --currently durvalumab  Permanent A-fib CHA2DS2-VASc >4 on Eliquis  Previous heavy EtOH UGIB 09/09/2023 cratered ulcer duodenum status post mesenteric angio/embolization @ [facility] 12/6-12/8 readmission--with upper GI bleed clean-based ulcer told to continue twice daily PPI manage resumed Eliquis  hemoglobin was 9.6 on discharge  Significant Events: 12/16 [facility] ED eval dizziness nausea-couple of bright red vomit-->?  ICU [facility]--> [address] [place] transfer 2/2 12/17 ICU [facility]-->  [facility] 2/2--IR duodenal bleed Clip ->?  Continued ongoing bleeding requiring pressors--- massive transfusion protocol-----emergent ex lap Dr. Rubin with duodenotomy ligation GDA branches VAC placement 12/19 Secondary closure of abdomen Dr. Ebbie  12/26 barium swallow performed and upper GI 1226 without evidence of leak.  He has been intermittently bleeding and slowly oozing all week and had a bowel movement that was hematochezia and CT angio abdomen pelvis on 1/3 showed no evidence of active GI bleeding.  DOAC remains being held but he has been resumed on a heparin  drip and  was continued despite having intermittently bloody bowel movements given his stable and slow drop. Heparin  drip continued however patient became more symptomatic today and dropped his blood count and heparin  drip was discontinued and he was taken for an urgent EGD which showed no active bleeding upper GI tract.  CT angio bleed did not show any active bleed.  Heparin  drip remains discontinued awaiting recs to monitor his hemoglobin carefully. After procedures he is back on Regular Diet  Hemoglobin  continues to trend slowly downwards and he was a little lightheaded today.  GI recommends continue observation and no repeat colonoscopy at this time.  Patient's wife was very frustrated and wanted a second GI opinion and has transferred the patient's care to [facility] in outpatient setting and [facility] graciously will see the patient in the a.m. for second opinion.  Cardiology was reconsulted and see the discussion below.  Assessment and Plan:  Duodenal ulcer hemorrhage with coiling 09/2023  s/p laparotomy 12/17, secondary closure 12/19 as above -Vomiting resolved since 12/26.  Off TPN on 12/31.  JP drain removed 12/31 -Tolerating mechanical soft diet.  Back on IV PPI BID -PT/OT recommending SNF when medically stable and when Hgb Stablizes he has drop in his hemoglobin and became symptomatic 02/11/2024 -Today he had some symptoms and was little lightheaded and dizzy but no further bleeding noted.   Recurrent GI Bleed Symptomatic Anemia: Had multiple episodes of bloody and melanotic stools on 1/3 and 1/4 and now has had repeat and large volume marroon colored stool on the a.m. of 02/11/2024 assoc(images in media) -Vital signs had remained stable but was lightheaded and dizzy today with associated drop in BP and had Tachycardia - On 02/11/2024 he was given 1 liter bolus, TED hose added.  -Hgb had been slowly dropping and hadd been oozing all week; he is having intermittent Bloody Bowel Movements but today was worse than his prior. Hgb/Hct Trend:  Recent Labs  Lab 02/08/24 2250 02/09/24 0226 02/10/24 0244 02/11/24 0244 02/11/24 0904 02/11/24 2157 02/12/24 1007  HGB 12.3* 12.0* 11.8* 11.5* 10.3* 9.5* 8.8*  HCT 36.5* 35.6* 34.7* 33.6* 29.8* 28.2* 26.3*  MCV  --  88.3 87.6 87.5 87.6 91.3 90.1  -General surgery re-engated, following; hemoglobin remained stable  so with GI and general surgery and feel that is okay to continue heparin  from their standpoint -02/03/24 CTA GIB negative for active  bleeding - EGD (1/5) showed no active bleeding or high risk bleeding lesions and the bleeding was suspected to be from friable mucosa in the duodenal bulb at the site of surgical repair after DOAC started -Discontinued heparin  drip  -Underwent Repeat EGD: Normal esophagus with erythematous mucosa in the gastric body with intact suture and erythematous friable mucosa but no evidence of recent bleeding for this area.  Given nonbleeding duodenal diverticulum and no blood or evidence of recent bleeding in the upper GI tract and is felt that the source of his new recurrent bleeding is from another source and possibly a diverticulum -Underwent repeat CTA GI Bleed 02/11/24 and showed Colonic diverticulosis, with improving diverticulitis of the sigmoid colon. No perforation, fluid collection, or abscess. Stable right adrenal myelolipoma. Stable sequela of chronic bladder outlet obstruction. The bladder is decompressed with a Foley catheter. Small left pleural effusion. -Continuing IV protonix  BID with additional Pantoprazole  Bolus  -See discussion with cardiology as below -Hemoglobin continues to trend down and he was lightheaded today with no active bleeding noted though the GI team recommended observation and output the patient to repeat endoscopic procedures or colonoscopy as he has active bleeding and patient's wife is concerned about this and wanted a second GI opinion.  The ED will GI team was notified and graciously will see the patient on 02/13/2024 as the [place] [facility] team is signed off the case   Sepsis 2/2 to Above  Staph epi Bacteremia-MRSE with resistances Likely peritonitis - Bcx 12/21 postive for staph epi - Bcx 12/26 no growth - Formally consulted ID on 12/30  - Pt has completed course of antibiotics as of 12/30  - RIJ CVC removed 1/2 -WBC Trend:  Recent Labs  Lab 02/08/24 0441 02/09/24 0226 02/10/24 0244 02/11/24 0244 02/11/24 0904 02/11/24 2157 02/12/24 1007  WBC 5.9 6.1 6.1 5.7 10.4  8.3 6.7   AKI on admission Metabolic Acidosis - Baseline Cr 0.7 - 0.9. Most recently has been consistent 1.31-1.34. Current BUN/Cr Trend: Recent Labs  Lab 02/06/24 0350 02/07/24 0958 02/08/24 0441 02/09/24 0226 02/10/24 0244 02/11/24 0244 02/12/24 1007  BUN 34* 38* 40* 42* 38* 34* 33*  CREATININE 1.31* 1.37* 1.37* 1.37* 1.27* 1.17 1.34*  -Had a Slight Metabolic Acidosis and now improved. CO2 is 23, AG is 11, and Chloride Level is 103 -Given his hypotension today given 1 L bolus. -Avoid Nephrotoxic Medications, Contrast Dyes, Hypotension and Dehydration to Ensure Adequate Renal Perfusion and will need to Renally Adjust Meds -Continue to Monitor and Trend Renal Function carefully and repeat CMP in the AM   Atrial Fibrillation: CHADVASC >4. Patient's family discussed with Cardiology on 1/2, agreed to continue Eliquis . Please see cardiology consult note from 02/02/2024.  Eliquis  was again stopped on 1/3 given episode of bloody stools/melena and given that he continues to drop his Hgb and had Symptoms will Discontinue Heparin  gtt altogether.  Cardiology was consulted and had an extensive discussion and the current plan is to hold off further anticoagulation for several days and then once his GI system heals trialing heparin  again and if tolerated retrying Eliquis  but if he has recurrent bleeding after restarting Eliquis  likely hold off anticoagulation indefinitely knowing the risks of stroke and PE -Cardiology feels that he can consider a Watchman procedure and watchman left atrial appendage occlusion device for stroke prophylaxis but will need to demonstrate  tolerability of anticoagulation for at least 45 days of uninterrupted anticoagulation post watchman for 6 months of dual antiplatelet therapy followed by single antiplatelet therapy   Right cephalic vein acute SVT Right brachial vein acute DVT -Noted on RUE doppler on 12/29 -Apixaban  held as above ; Now will be holding Heparin  as well and  once his bleeding stops we will resume at some point   Hypernatremia: Resolved. Thought to be secondary to free water  deficit. Na+ Stable and is 136 on last check. CTM and Trend and repeat CMP in the AM    Hypokalemia: Resolved. K+ is now 4.0 on last check. CTM and Replete as Necessary. Repeat CMP in the AM    Urinary Retention: Foley was initially removed and patient was voiding well. However, noted to be retaining again so Foley/indwelling cath placed and will be kept at discharge. Will need outpt f/u with Urology   Left lung mass bronchogenic CA -Holding immunotherapy/chemotherapy -Will need Outpatient follow-up   Critical illness thrombocytopenia - resolved; Platelet Count is now 158  Hypoalbuminemia: Patient's Albumin Trend the last 7 days: Recent Labs  Lab 01/29/24 0330 02/07/24 0958 02/08/24 0441 02/09/24 0226 02/10/24 0244 02/11/24 0244 02/12/24 1007  ALBUMIN 3.0* 3.3* 3.3* 3.3* 3.3* 3.3* 3.1*  -Continue to Monitor and Trend and repeat CMP in the AM  Class III (Morbid) Obesity: Complicates overall prognosis and care. Estimated body mass index is 40.86 kg/m as calculated from the following:   Height as of this encounter: 6' 0.01 (1.829 m).   Weight as of this encounter: 136.7 kg. Weight Loss and Dietary Counseling given   DVT prophylaxis: Place TED hose Start: 02/11/24 0809 SCDs Start: 01/16/24 1731    Code Status: Full Code Family Communication: D/w wife over the Telephone  Disposition Plan:  Level of care: Telemetry Status is: Inpatient Remains inpatient appropriate because: Continues to trend down and will need to monitor for active signs of bleeding   Consultants:  General Surgery Cardiology Gastroenterology ([place]) and [facility] given wife wanting a second opinion.   Procedures:  As delineated as above  Antimicrobials:  Anti-infectives (From admission, onward)    Start     Dose/Rate Route Frequency Ordered Stop   01/29/24 2200   fluconazole  (DIFLUCAN ) IVPB 400 mg  Status:  Discontinued        400 mg 100 mL/hr over 120 Minutes Intravenous Every 24 hours 01/29/24 1336 01/29/24 1346   01/29/24 2200  fluconazole  (DIFLUCAN ) IVPB 400 mg  Status:  Discontinued        400 mg 200 mL/hr over 60 Minutes Intravenous Every 24 hours 01/29/24 1346 01/30/24 1533   01/25/24 1500  vancomycin  (VANCOREADY) IVPB 1500 mg/300 mL  Status:  Discontinued        1,500 mg 150 mL/hr over 120 Minutes Intravenous Every 24 hours 01/24/24 1322 01/30/24 1233   01/24/24 1115  vancomycin  (VANCOCIN ) 2,500 mg in sodium chloride  0.9 % 500 mL IVPB        2,500 mg 262.5 mL/hr over 120 Minutes Intravenous  Once 01/24/24 1029 01/24/24 1530   01/17/24 2245  piperacillin -tazobactam (ZOSYN ) IVPB 3.375 g  Status:  Discontinued        3.375 g 12.5 mL/hr over 240 Minutes Intravenous Every 8 hours 01/17/24 2149 01/30/24 1233   01/17/24 2200  fluconazole  (DIFLUCAN ) IVPB 400 mg  Status:  Discontinued        400 mg 100 mL/hr over 120 Minutes Intravenous Every 24 hours 01/17/24 2059 01/29/24 1336  Subjective: Examined at bedside and he was resting and felt okay but felt fatigued and tired.  Subsequently became dizzy this afternoon.  Blood count continues to drop but no active bleeding noted and he has not had a bowel movement since yesterday.  No nausea or vomiting.  No other concerns or complaints at this time.  Objective: Vitals:   02/12/24 0849 02/12/24 1521 02/12/24 1636 02/12/24 1951  BP: 109/65 128/83 (!) 137/91 109/82  Pulse: 83 91 85 90  Resp: 18  18 18   Temp: 98.6 F (37 C)  98.2 F (36.8 C) 97.6 F (36.4 C)  TempSrc:      SpO2: 99%  98% 99%  Weight:      Height:        Intake/Output Summary (Last 24 hours) at 02/12/2024 2020 Last data filed at 02/12/2024 1000 Gross per 24 hour  Intake 300 ml  Output 3555 ml  Net -3255 ml   Filed Weights   01/22/24 0232 01/25/24 0452 01/29/24 0406  Weight: 135.7 kg 127.2 kg (!) 136.7 kg    Examination: Physical Exam:  Constitutional: WN/WD obese chronically ill-appearing Caucasian male who is resting Respiratory: Diminished to auscultation bilaterally, no wheezing, rales, rhonchi or crackles. Normal respiratory effort and patient is not tachypenic. No accessory muscle use.  Unlabored breathing Cardiovascular: RRR, no murmurs / rubs / gallops. S1 and S2 auscultated. No extremity edema. Abdomen: Soft, non-tender, distended secondary habitus. Bowel sounds positive.  GU: Deferred. Musculoskeletal: No clubbing / cyanosis of digits/nails. No joint deformity upper and lower extremities.  Skin: No rashes, lesions, ulcers limited skin evaluation. No induration; Warm and dry.  Neurologic: CN 2-12 grossly intact with no focal deficits but is very hard of hearing.  Romberg sign and cerebellar reflexes were not assessed. Psychiatric: Fatigued but he is awake and alert and appears calm  Data Reviewed: I have personally reviewed following labs and imaging studies  CBC: Recent Labs  Lab 02/10/24 0244 02/11/24 0244 02/11/24 0904 02/11/24 2157 02/12/24 1007  WBC 6.1 5.7 10.4 8.3 6.7  NEUTROABS 4.6 4.4 9.1* 6.9 5.5  HGB 11.8* 11.5* 10.3* 9.5* 8.8*  HCT 34.7* 33.6* 29.8* 28.2* 26.3*  MCV 87.6 87.5 87.6 91.3 90.1  PLT 168 160 182 164 158   Basic Metabolic Panel: Recent Labs  Lab 02/08/24 0441 02/09/24 0226 02/10/24 0244 02/11/24 0244 02/12/24 1007  NA 135 136 136 136 134*  K 3.8 3.6 3.9 3.5 4.0  CL 101 102 103 103 103  CO2 24 23 22 23  20*  GLUCOSE 94 96 95 98 129*  BUN 40* 42* 38* 34* 33*  CREATININE 1.37* 1.37* 1.27* 1.17 1.34*  CALCIUM  9.2 9.1 9.2 9.1 8.6*  MG 2.2 2.2 2.1 2.0 1.9  PHOS 3.2 3.6 3.7 3.2 2.9   GFR: Estimated Creatinine Clearance: 72.4 mL/min (A) (by C-G formula based on SCr of 1.34 mg/dL (H)). Liver Function Tests: Recent Labs  Lab 02/08/24 0441 02/09/24 0226 02/10/24 0244 02/11/24 0244 02/12/24 1007  AST 19 18 16 19 16   ALT 25 22 20 22 18    ALKPHOS 75 75 73 74 64  BILITOT 0.5 0.5 0.6 0.5 0.4  PROT 6.5 6.4* 6.3* 6.4* 5.8*  ALBUMIN 3.3* 3.3* 3.3* 3.3* 3.1*   No results for input(s): LIPASE, AMYLASE in the last 168 hours. No results for input(s): AMMONIA in the last 168 hours. Coagulation Profile: No results for input(s): INR, PROTIME in the last 168 hours. Cardiac Enzymes: No results for input(s): CKTOTAL, CKMB, CKMBINDEX, TROPONINI  in the last 168 hours. BNP (last 3 results) No results for input(s): PROBNP in the last 8760 hours. HbA1C: No results for input(s): HGBA1C in the last 72 hours. CBG: Recent Labs  Lab 02/11/24 0747  GLUCAP 144*   Lipid Profile: No results for input(s): CHOL, HDL, LDLCALC, TRIG, CHOLHDL, LDLDIRECT in the last 72 hours. Thyroid  Function Tests: No results for input(s): TSH, T4TOTAL, FREET4, T3FREE, THYROIDAB in the last 72 hours. Anemia Panel: No results for input(s): VITAMINB12, FOLATE, FERRITIN, TIBC, IRON , RETICCTPCT in the last 72 hours. Sepsis Labs: No results for input(s): PROCALCITON, LATICACIDVEN in the last 168 hours.  No results found for this or any previous visit (from the past 240 hours).   Radiology Studies: CT ANGIO GI BLEED Result Date: 02/11/2024 CLINICAL DATA:  Gastrointestinal bleeding, negative EGD EXAM: CTA ABDOMEN AND PELVIS WITHOUT AND WITH CONTRAST TECHNIQUE: Multidetector CT imaging of the abdomen and pelvis was performed using the standard protocol during bolus administration of intravenous contrast. Multiplanar reconstructed images and MIPs were obtained and reviewed to evaluate the vascular anatomy. RADIATION DOSE REDUCTION: This exam was performed according to the departmental dose-optimization program which includes automated exposure control, adjustment of the mA and/or kV according to patient size and/or use of iterative reconstruction technique. CONTRAST:  OMNIPAQUE  IOHEXOL  350 MG/ML SOLN  COMPARISON:  02/03/2024 FINDINGS: VASCULAR Aorta: Normal caliber aorta without aneurysm, dissection, vasculitis or significant stenosis. Diffuse atherosclerosis. Celiac: Patent without evidence of aneurysm, dissection, vasculitis or significant stenosis. Stable embolic coils within the gastroduodenal artery distribution from prior embolization procedure. SMA: Patent without evidence of aneurysm, dissection, vasculitis or significant stenosis. Mild atherosclerosis. Renals: Both renal arteries are patent without evidence of aneurysm, dissection, vasculitis, fibromuscular dysplasia or significant stenosis. Mild atherosclerosis. IMA: Patent without evidence of aneurysm, dissection, vasculitis or significant stenosis. Inflow: Patent without evidence of aneurysm, dissection, vasculitis or significant stenosis. Mild atherosclerosis. Proximal Outflow: Bilateral common femoral and visualized portions of the superficial and profunda femoral arteries are patent without evidence of aneurysm, dissection, vasculitis or significant stenosis. Veins: No obvious venous abnormality within the limitations of this arterial phase study. Review of the MIP images confirms the above findings. NON-VASCULAR Lower chest: Small left pleural effusion. No acute airspace disease. Hepatobiliary: No focal liver abnormality is seen. No gallstones, gallbladder wall thickening, or biliary dilatation. Pancreas: Unremarkable. No pancreatic ductal dilatation or surrounding inflammatory changes. Spleen: Normal in size without focal abnormality. Adrenals/Urinary Tract: Stable right adrenal myelolipoma. Left adrenal is unremarkable. Stable appearance of the bilateral kidneys. No urinary tract calculi or obstruction. Evaluation of the bladder is limited due to decompression with a Foley catheter. There is persistent bladder wall thickening with multiple bladder diverticula compatible with sequela of chronic bladder outlet obstruction. Stomach/Bowel: No bowel  obstruction or ileus. Normal appendix right lower quadrant. Diffuse colonic diverticulosis. Mild wall thickening and pericolonic fat stranding at the sigmoid colon has improved since prior study, consistent with resolving diverticulitis. No perforation, fluid collection, or abscess. No intraluminal contrast accumulation to suggest active gastrointestinal hemorrhage. Lymphatic: No pathologic adenopathy. Reproductive: Dense calcifications within the prostate. Other: No free fluid or free intraperitoneal gas. No abdominal wall hernia. Musculoskeletal: No acute or destructive bony abnormalities. Stable chronic L1 compression deformity. Reconstructed images demonstrate no additional findings. IMPRESSION: VASCULAR 1. No evidence of active gastrointestinal bleeding. 2.  Aortic Atherosclerosis (ICD10-I70.0). NON-VASCULAR 1. Colonic diverticulosis, with improving diverticulitis of the sigmoid colon. No perforation, fluid collection, or abscess. 2. Stable right adrenal myelolipoma. 3. Stable sequela of chronic bladder outlet  obstruction. The bladder is decompressed with a Foley catheter. 4. Small left pleural effusion. Electronically Signed   By: Ozell Daring M.D.   On: 02/11/2024 15:30   Scheduled Meds:  budesonide -glycopyrrolate -formoterol   2 puff Inhalation BID   carbamide peroxide  5 drop Right EAR BID   Chlorhexidine  Gluconate Cloth  6 each Topical Daily   feeding supplement  237 mL Oral BID BM   Gerhardt's butt cream   Topical BID   multivitamin with minerals  1 tablet Oral Daily   mouth rinse  15 mL Mouth Rinse TID   pantoprazole  (PROTONIX ) IV  40 mg Intravenous Q12H   sucralfate   1 g Oral TID WC & HS   Continuous Infusions:   LOS: 26 days   Alejandro Marker, DO [facility] Available via Epic secure chat 7am-7pm After these hours, please refer to coverage provider listed on [email or web address] 02/12/2024, 8:20 PM  "

## 2024-02-13 DIAGNOSIS — I82629 Acute embolism and thrombosis of deep veins of unspecified upper extremity: Secondary | ICD-10-CM | POA: Diagnosis not present

## 2024-02-13 DIAGNOSIS — I482 Chronic atrial fibrillation, unspecified: Secondary | ICD-10-CM | POA: Diagnosis not present

## 2024-02-13 DIAGNOSIS — Z9189 Other specified personal risk factors, not elsewhere classified: Secondary | ICD-10-CM | POA: Diagnosis not present

## 2024-02-13 DIAGNOSIS — K264 Chronic or unspecified duodenal ulcer with hemorrhage: Secondary | ICD-10-CM | POA: Diagnosis not present

## 2024-02-13 LAB — CBC WITH DIFFERENTIAL/PLATELET
Abs Immature Granulocytes: 0.04 K/uL (ref 0.00–0.07)
Basophils Absolute: 0 K/uL (ref 0.0–0.1)
Basophils Relative: 1 %
Eosinophils Absolute: 0.2 K/uL (ref 0.0–0.5)
Eosinophils Relative: 4 %
HCT: 24.2 % — ABNORMAL LOW (ref 39.0–52.0)
Hemoglobin: 8.2 g/dL — ABNORMAL LOW (ref 13.0–17.0)
Immature Granulocytes: 1 %
Lymphocytes Relative: 10 %
Lymphs Abs: 0.6 K/uL — ABNORMAL LOW (ref 0.7–4.0)
MCH: 30.6 pg (ref 26.0–34.0)
MCHC: 33.9 g/dL (ref 30.0–36.0)
MCV: 90.3 fL (ref 80.0–100.0)
Monocytes Absolute: 0.5 K/uL (ref 0.1–1.0)
Monocytes Relative: 9 %
Neutro Abs: 4.7 K/uL (ref 1.7–7.7)
Neutrophils Relative %: 75 %
Platelets: 144 K/uL — ABNORMAL LOW (ref 150–400)
RBC: 2.68 MIL/uL — ABNORMAL LOW (ref 4.22–5.81)
RDW: 15.7 % — ABNORMAL HIGH (ref 11.5–15.5)
WBC: 6.1 K/uL (ref 4.0–10.5)
nRBC: 0 % (ref 0.0–0.2)

## 2024-02-13 LAB — COMPREHENSIVE METABOLIC PANEL WITH GFR
ALT: 18 U/L (ref 0–44)
AST: 16 U/L (ref 15–41)
Albumin: 3.2 g/dL — ABNORMAL LOW (ref 3.5–5.0)
Alkaline Phosphatase: 65 U/L (ref 38–126)
Anion gap: 9 (ref 5–15)
BUN: 28 mg/dL — ABNORMAL HIGH (ref 8–23)
CO2: 24 mmol/L (ref 22–32)
Calcium: 8.8 mg/dL — ABNORMAL LOW (ref 8.9–10.3)
Chloride: 104 mmol/L (ref 98–111)
Creatinine, Ser: 1.35 mg/dL — ABNORMAL HIGH (ref 0.61–1.24)
GFR, Estimated: 56 mL/min — ABNORMAL LOW
Glucose, Bld: 94 mg/dL (ref 70–99)
Potassium: 3.9 mmol/L (ref 3.5–5.1)
Sodium: 136 mmol/L (ref 135–145)
Total Bilirubin: 0.5 mg/dL (ref 0.0–1.2)
Total Protein: 5.7 g/dL — ABNORMAL LOW (ref 6.5–8.1)

## 2024-02-13 LAB — HEPARIN LEVEL (UNFRACTIONATED)
Heparin Unfractionated: <0.1 [IU]/mL — ABNORMAL LOW (ref 0.30–0.70)
Heparin Unfractionated: 0.43 [IU]/mL (ref 0.30–0.70)

## 2024-02-13 LAB — PHOSPHORUS: Phosphorus: 3.2 mg/dL (ref 2.5–4.6)

## 2024-02-13 LAB — MAGNESIUM: Magnesium: 1.9 mg/dL (ref 1.7–2.4)

## 2024-02-13 LAB — HEMOGLOBIN AND HEMATOCRIT, BLOOD
HCT: 23.6 % — ABNORMAL LOW (ref 39.0–52.0)
HCT: 25.9 % — ABNORMAL LOW (ref 39.0–52.0)
Hemoglobin: 8 g/dL — ABNORMAL LOW (ref 13.0–17.0)
Hemoglobin: 8.6 g/dL — ABNORMAL LOW (ref 13.0–17.0)

## 2024-02-13 MED ORDER — HEPARIN (PORCINE) 25000 UT/250ML-% IV SOLN
1700.0000 [IU]/h | INTRAVENOUS | Status: DC
  Administered 2024-02-13: 2000 [IU]/h via INTRAVENOUS
  Administered 2024-02-15: 1900 [IU]/h via INTRAVENOUS
  Administered 2024-02-16 – 2024-02-18 (×5): 1700 [IU]/h via INTRAVENOUS
  Filled 2024-02-13 (×10): qty 250

## 2024-02-13 NOTE — Progress Notes (Signed)
 Occupational Therapy Treatment Patient Details Name: Raymond Hurst MRN: [id number] DOB: 07/06/1950 Today's Date: 02/13/2024   History of present illness 74 y.o. male admitted to [facility] 01/16/24 with transfer to [facility] ICU on 12/17 with dizziness, hematemesis, blood loss anemia, hemorrhagic shock. EGD 12/17 with duodenal active bleed, clipped; same day emergent ex lap and wound vac placement. S/p abdominal closure 12/19. ETT 12/17-12/23. RUE SVT and DVT noted 12/29. Off TPN 12/31; JP drain removed 12/31. Of note, recent admission 01/06/24-01/08/24 with GIB. Other PMH includes afib on Eliquis , CVA, recurrent GIB, stage IV lung CA (dx 06/2023), gout, previous ETOH abuse.   OT comments  Pt progressing towards OT goals. Focus of session on progressing functional mobility to facilitate increased activity tolerance and increasing independent engagement in ADL tasks. Pt required CGA for functional mobility, set-up A for feeding tasks, and up to total A for LB dressing this date. Pt continues to benefit from acute skilled OT services to facilitate continued progress towards goals. Continue per POC.       If plan is discharge home, recommend the following:  Two people to help with bathing/dressing/bathroom;Assistance with cooking/housework;Direct supervision/assist for medications management;Assist for transportation;Help with stairs or ramp for entrance;A little help with walking and/or transfers   Equipment Recommendations  BSC/3in1;Wheelchair cushion (measurements OT);Tub/shower bench    Recommendations for Other Services      Precautions / Restrictions Precautions Precautions: Fall Recall of Precautions/Restrictions: Intact Restrictions Weight Bearing Restrictions Per Provider Order: No       Mobility Bed Mobility Overal bed mobility: Modified Independent             General bed mobility comments: No physical assistance required to come to EOB    Transfers Overall transfer level: Needs  assistance Equipment used: Rolling walker (2 wheels) Transfers: Sit to/from Stand Sit to Stand: Contact guard assist           General transfer comment: Verbal cues for proper hand placement on RW, CGA for sit to stand. Pt requested functional ambulation in hallway. Ambulated to nurse station and back, Pt with decreased activity tolerance requiring OT to adjust treatment task to provide rest prior to completion for Pt safety.     Balance Overall balance assessment: Needs assistance Sitting-balance support: Feet supported Sitting balance-Leahy Scale: Normal Sitting balance - Comments: EOB   Standing balance support: Bilateral upper extremity supported, During functional activity, Reliant on assistive device for balance Standing balance-Leahy Scale: Fair Standing balance comment: Reliant on RW during dynamic standing tasks                           ADL either performed or assessed with clinical judgement   ADL Overall ADL's : Needs assistance/impaired Eating/Feeding: Set up;Sitting Eating/Feeding Details (indicate cue type and reason): set-up A to manipulate containers requiring FM skills             Upper Body Dressing : Set up;Sitting Upper Body Dressing Details (indicate cue type and reason): set-up sitting EOB Lower Body Dressing: Total assistance;Bed level Lower Body Dressing Details (indicate cue type and reason): total A don/doff socks bilaterally while semi reclined in bed                    Extremity/Trunk Assessment Upper Extremity Assessment Upper Extremity Assessment: Generalized weakness            Vision       Perception     Praxis  Communication Communication Communication: Impaired Factors Affecting Communication: Hearing impaired   Cognition Arousal: Alert Behavior During Therapy: WFL for tasks assessed/performed Cognition: No apparent impairments             OT - Cognition Comments: Increased time to follow  commands but overall WFL                 Following commands: Intact        Cueing   Cueing Techniques: Verbal cues  Exercises Exercises: Other exercises Other Exercises Other Exercises: Pt utilized resistance ball with BUE to strengthen gross grasp. 10 reps bilaterally    Shoulder Instructions       General Comments VSS on RA    Pertinent Vitals/ Pain       Pain Assessment Pain Assessment: Faces Faces Pain Scale: Hurts a little bit Pain Location: BLE from compression stockings Pain Descriptors / Indicators: Discomfort, Sore Pain Intervention(s): Limited activity within patient's tolerance, Monitored during session, Repositioned  Home Living                                          Prior Functioning/Environment              Frequency  Min 2X/week        Progress Toward Goals  OT Goals(current goals can now be found in the care plan section)  Progress towards OT goals: Progressing toward goals  Acute Rehab OT Goals Patient Stated Goal: to get to rehab OT Goal Formulation: With patient Time For Goal Achievement: 02/23/24 Potential to Achieve Goals: Good ADL Goals Pt Will Perform Upper Body Bathing: with set-up Pt Will Perform Lower Body Bathing: with supervision;sit to/from stand Pt Will Perform Upper Body Dressing: with set-up;sitting Pt Will Perform Lower Body Dressing: with supervision;sit to/from stand Pt Will Transfer to Toilet: with modified independence;ambulating;regular height toilet Pt Will Perform Toileting - Clothing Manipulation and hygiene: with modified independence;sitting/lateral leans;sit to/from stand Pt/caregiver will Perform Home Exercise Program: Increased strength;Both right and left upper extremity;With Supervision;With written HEP provided (AROM progressing to theraband)  Plan      Co-evaluation                 AM-PAC OT 6 Clicks Daily Activity     Outcome Measure   Help from another person  eating meals?: A Little Help from another person taking care of personal grooming?: A Little Help from another person toileting, which includes using toliet, bedpan, or urinal?: A Little Help from another person bathing (including washing, rinsing, drying)?: A Lot Help from another person to put on and taking off regular upper body clothing?: A Little Help from another person to put on and taking off regular lower body clothing?: A Lot 6 Click Score: 16    End of Session Equipment Utilized During Treatment: Gait belt;Rolling walker (2 wheels)  OT Visit Diagnosis: Muscle weakness (generalized) (M62.81)   Activity Tolerance Patient tolerated treatment well   Patient Left in chair;with call bell/phone within reach;with chair alarm set   Nurse Communication Mobility status        Time: 8656-8596 OT Time Calculation (min): 20 min  Charges: OT General Charges $OT Visit: 1 Visit OT Treatments $Therapeutic Activity: 8-22 mins  Maurilio CROME, OTR/L.  [facility]  Office: [phone]   Maurilio PARAS Obe Ahlers 02/13/2024, 3:21 PM

## 2024-02-13 NOTE — Progress Notes (Signed)
 PHARMACY - ANTICOAGULATION CONSULT NOTE  Pharmacy Consult for heparin   Indication: atrial fibrillation and chronic RUE DVT  Allergies[1]  Patient Measurements: Height: 6' 0.01 (182.9 cm) Weight: (!) 136.7 kg (301 lb 5.9 oz) IBW/kg (Calculated) : 77.62 HEPARIN  DW (KG): 103.8  Vital Signs: Temp: 97.9 F (36.6 C) (01/13 1606) Temp Source: Oral (01/13 1606) BP: 113/73 (01/13 1606) Pulse Rate: 81 (01/13 1606)  Labs: Recent Labs    02/11/24 0244 02/11/24 0904 02/11/24 2157 02/12/24 1007 02/12/24 2311 02/13/24 0357 02/13/24 1127 02/13/24 1644 02/13/24 2029  HGB 11.5*   < > 9.5* 8.8*   < > 8.2* 8.0* 8.6*  --   HCT 33.6*   < > 28.2* 26.3*   < > 24.2* 23.6* 25.9*  --   PLT 160   < > 164 158  --  144*  --   --   --   HEPARINUNFRC 0.53  --   --  <0.10*  --  <0.10*  --   --  0.43  CREATININE 1.17  --   --  1.34*  --  1.35*  --   --   --    < > = values in this interval not displayed.    Estimated Creatinine Clearance: 71.8 mL/min (A) (by C-G formula based on SCr of 1.35 mg/dL (H)).   Assessment: 26 YOM w/ Hx AF with a RUE DVT in need of AC. Apixaban  PTA. Last dose was >7 days ago. He was admitted to [facility] w/ duodenal ulcer hemorrhage which was clipped on 12/17.   Resuming heparin , with plans to monitor Hgb for 3-5 days while on heparin  before resuming apixaban .  Heparin  level previously therapeutic on 2000-2100 units/hr.  Will target low end of goal given bleed risk.  1/13 PM update: HL 0.43 No signs of bleeding or pauses noted w/ gtt per nurse  Goal of Therapy:  Heparin  level 0.3-0.5 units/mL Monitor platelets by anticoagulation protocol: Yes   Plan:  Continue heparin  infusion at 2000 units/hr Confirmatory Heparin  level w/ AM labs Check heparin  level daily while on heparin  Continue to monitor H&H and platelets   Tareka Jhaveri BS, PharmD, BCPS Clinical Pharmacist 02/13/2024 5:40 PM  Contact: [phone] after 3 PM     [1] No Known Allergies

## 2024-02-13 NOTE — Progress Notes (Signed)
 Stained the urine, didn't see any stones.

## 2024-02-13 NOTE — Progress Notes (Signed)
 " PROGRESS NOTE    Raymond Hurst  FMW:[id number] DOB: Nov 30, 1950 DOA: 01/16/2024 PCP: [name], MD   Brief Narrative:  The patient is  74 year old male w/ Left lung mass bronchogenic CA (poorly differentiated squamous cell) diagnosed 06/05/2023-bronchoscopy 08/09/2023 complicated by left PNA thorax-follows with Dr. Arlester carboplatin  and Taxol --currently durvalumab ,  Permanent A-fib CHA2DS2-VASc >4 on Eliquis , Previous heavy EtOH, UGIB 09/09/2023 cratered ulcer duodenum status post mesenteric angio/embolization @ [facility] with  12/6-12/8 readmission--with upper GI bleed clean-based ulcer told to continue twice daily PPI manage resumed Eliquis  hemoglobin was 9.6 on discharge at that time  Significant Events: 12/16 [facility] ED eval dizziness nausea-couple of bright red vomit-->?  ICU [facility]--> [address] [place] transfer 2/2 12/17 ICU [facility]-->  [place] transfer 2/2--IR duodenal bleed Clip ->?  Continued ongoing bleeding requiring pressors--- massive transfusion protocol-----emergent ex lap Dr. Rubin with duodenotomy ligation GDA branches VAC placement 12/19 Secondary closure of abdomen Dr. Ebbie  12/26 barium swallow performed and upper GI 1226 without evidence of leak.  Prolonged hospital course and was intermittently bleeding with slow easy.  CT angio on Monday showed no evidence of active bleeding.  DOAC was held but he was resumed on a heparin  drip and patient had negative stools but hemoglobin stable.  Subsequently patient had more symptomatic anemia and received an hemoglobin dropped and repeat CT EGD was done and showed no active bleeding in the upper GI tract.  02/12/24: Hemoglobin continues to trend slowly downwards and he was a little lightheaded today.  GI recommends continue observation and no repeat colonoscopy at this time.  Patient's wife was very frustrated and wanted a second GI opinion and has transferred the patient's care to [facility] in outpatient setting and [facility]  graciously will see the patient in the a.m. for second opinion.  Cardiology was reconsulted and see the discussion below.  1/13/26[name] recommended resuming heparin  drip and monitoring for 3 to 5 days and will resume Eliquis  if stable and no further GI bleeding  Assessment and Plan:  Duodenal ulcer hemorrhage with coiling 09/2023  s/p laparotomy 12/17, secondary closure 12/19 as above -Vomiting resolved since 12/26.  Off TPN on 12/31.  JP drain removed 12/31 -Tolerating mechanical soft diet.  Back on IV PPI BID -PT/OT recommending SNF when medically stable and when Hgb Stablizes he has drop in his hemoglobin and became symptomatic 02/11/2024 -02/12/24 he had some symptoms and was little lightheaded and dizzy but no further bleeding noted.   Recurrent GI Bleed and Symptomatic Anemia: Had multiple episodes of bloody and melanotic stools on 1/3 and 1/4 and now has had repeat and large volume marroon colored stool on the a.m. of 02/11/2024 assoc (images in media) -VS had remained stable but was lightheaded and dizzy today with associated drop in BP and had Tachycardia -On 02/11/2024 he was given 1 liter bolus, TED hose added.  -Hgb had been slowly dropping and hadd been oozing all week; he is having intermittent Bloody Bowel Movements but the day before was worse than his prior. Hgb/Hct Trend:  Recent Labs  Lab 02/11/24 0904 02/11/24 2157 02/12/24 1007 02/12/24 2311 02/13/24 0357 02/13/24 1127 02/13/24 1644  HGB 10.3* 9.5* 8.8* 8.8* 8.2* 8.0* 8.6*  HCT 29.8* 28.2* 26.3* 26.1* 24.2* 23.6* 25.9*  MCV 87.6 91.3 90.1  --  90.3  --   --   -General surgery re-engaged, following; hemoglobin remained stable so with GI and general surgery and feel that is okay to continue heparin   from their standpoint -02/03/24 and 02/11/24 CTA GIB negative for active bleeding -EGD (1/5) showed no active bleeding or high risk bleeding lesions and the bleeding was suspected to be from friable mucosa in the duodenal  bulb at the site of surgical repair after DOAC started -Heparin  gtt was stopped but per discussion w/ GI will resume 02/13/24 -Underwent Repeat EGD 02/11/24: Normal esophagus with erythematous mucosa in the gastric body with intact suture and erythematous friable mucosa but no evidence of recent bleeding for this area.  Given nonbleeding duodenal diverticulum and no blood or evidence of recent bleeding in the upper GI tract and is felt that the source of his new recurrent bleeding is from another source and possibly a diverticulum -Continuing IV protonix  BID with additional Pantoprazole  Bolus  -See discussion with cardiology as below -Hemoglobin continues to trend down and he was lightheaded 1/12/ with no active bleeding noted though the GI team recommended observation and output the patient to repeat endoscopic procedures or colonoscopy as he has active bleeding and patient's wife was concerned about this and wanted a second GI opinion.  The [facility] team was notified and graciously will see the patient on 02/13/2024 as the [place] [facility] team is signed off the case. [facility] recommending resuming Heparin  gtt today    Sepsis 2/2 to Above  Staph epi Bacteremia-MRSE with resistances Likely peritonitis - Bcx 12/21 postive for staph epi - Bcx 12/26 no growth - Formally consulted ID on 12/30  - Pt has completed course of antibiotics as of 12/30  - RIJ CVC removed 02/02/24 -WBC normal now and is 6.1.   AKI on admission Metabolic Acidosis - Baseline Cr 0.7 - 0.9. Most recently has been consistent 1.31-1.34. Current BUN/Cr Trend has been stable and is 28/1.35.MA has improved and now CO2 is 24, AG is 9, and Chloride Level is 104. Given his hypotension yesterday given 1 L bolus. Avoid Nephrotoxic Medications, Contrast Dyes, Hypotension and Dehydration to Ensure Adequate Renal Perfusion and will need to Renally Adjust Meds. CTM & Trend Renal Function carefully & repeat CMP in the AM   Atrial Fibrillation:  CHADVASC >4. Patient's family discussed with Cardiology on 1/2, agreed to continue Eliquis . Please see cardiology consult note from 02/02/2024.  Eliquis  was again stopped on 1/3 given episode of bloody stools/melena and given that he continued to drop his Hgb and had Symptoms Discontinued Heparin  gtt altogether.   -Cardiology was consulted and had an extensive discussion and the plan was to hold off further anticoagulation for several days and then once his GI system heals trialing heparin  again; The GI Team recommended to resume Heparin  gtt 02/13/24 -If Heparin  gtt is tolerated after 3-5 Days will be re-trialing Apixaban  to see if he tolerates that. If he has recurrent bleeding after restarting Eliquis  likely hold off anticoagulation indefinitely knowing the risks of stroke and PE -Cardiology feels that he can consider a Watchman procedure and watchman left atrial appendage occlusion device for stroke prophylaxis but will need to demonstrate tolerability of anticoagulation for at least 45 days of uninterrupted anticoagulation post watchman for 6 months of dual antiplatelet therapy followed by single antiplatelet therapy   Right Cephalic Vein Acute SVT and Right Brachial Vein Acute DVT: Noted on RUE doppler on 12/29 -Apixaban  held as above; Was holding his heparin  drip but after discussion with the new GI team they are recommending resuming Heparin  now given no active bleeding and continuing 3 to 5 days to see if he tolerates this and  then resuming Eliquis  at that time   Hypernatremia: Resolved. Thought to be secondary to free water  deficit. Na+ Stable and is 136 on last check. CTM and Trend and repeat CMP in the AM    Hypokalemia: Resolved. K+ is now 3.9 on last check. CTM and Replete as Necessary. Repeat CMP in the AM    Urinary Retention: Foley was initially removed and patient was voiding well. However, noted to be retaining again so Foley/indwelling cath placed and will be kept at discharge. Will need  outpt f/u with Urology   Left Lung Mass Bronchogenic CA: Holding Immunotherapy/Chemotherapy. Will need Outpatient follow-up   Critical illness Thrombocytopenia: Had resolved but now Platelet Count has gone to 144  Hypoalbuminemia: Albumin Lvl ranging from 3.1-3.3. CTM & Trend & repeat CMP in the AM  Class III (Morbid) Obesity: Complicates overall prognosis and care. Estimated body mass index is 40.86 kg/m as calculated from the following:   Height as of this encounter: 6' 0.01 (1.829 m).   Weight as of this encounter: 136.7 kg. Weight Loss and Dietary Counseling given   DVT prophylaxis: Place TED hose Start: 02/11/24 0809 SCDs Start: 01/16/24 1731    Code Status: Full Code Family Communication: No family present @ bedside   Disposition Plan:  Level of care: Telemetry Status is: Inpatient Remains inpatient appropriate because: Needs to have Stable Hgb and no further evidence bleeding and once medically stable will go to SNF   Consultants:  General Surgery Cardiology Gastroenterology ([place]) and Now [facility] given wife wanting a second opinion.   Procedures:  As delineated as above  Antimicrobials:  Anti-infectives (From admission, onward)    Start     Dose/Rate Route Frequency Ordered Stop   01/29/24 2200  fluconazole  (DIFLUCAN ) IVPB 400 mg  Status:  Discontinued        400 mg 100 mL/hr over 120 Minutes Intravenous Every 24 hours 01/29/24 1336 01/29/24 1346   01/29/24 2200  fluconazole  (DIFLUCAN ) IVPB 400 mg  Status:  Discontinued        400 mg 200 mL/hr over 60 Minutes Intravenous Every 24 hours 01/29/24 1346 01/30/24 1533   01/25/24 1500  vancomycin  (VANCOREADY) IVPB 1500 mg/300 mL  Status:  Discontinued        1,500 mg 150 mL/hr over 120 Minutes Intravenous Every 24 hours 01/24/24 1322 01/30/24 1233   01/24/24 1115  vancomycin  (VANCOCIN ) 2,500 mg in sodium chloride  0.9 % 500 mL IVPB        2,500 mg 262.5 mL/hr over 120 Minutes Intravenous  Once  01/24/24 1029 01/24/24 1530   01/17/24 2245  piperacillin -tazobactam (ZOSYN ) IVPB 3.375 g  Status:  Discontinued        3.375 g 12.5 mL/hr over 240 Minutes Intravenous Every 8 hours 01/17/24 2149 01/30/24 1233   01/17/24 2200  fluconazole  (DIFLUCAN ) IVPB 400 mg  Status:  Discontinued        400 mg 100 mL/hr over 120 Minutes Intravenous Every 24 hours 01/17/24 2059 01/29/24 1336       Subjective: And examined at bedside he is resting and complaining of some fatigue.  No nausea or vomiting and denies any dizziness today.  Has not had any bowel movement since the last time he had a bloody bowel movement.  No other concerns or complaints at this time.  Objective: Vitals:   02/13/24 0440 02/13/24 0715 02/13/24 0749 02/13/24 1606  BP: 108/66 114/76  113/73  Pulse: 83 76  81  Resp: 18 18  18  Temp: 97.7 F (36.5 C) 97.8 F (36.6 C)  97.9 F (36.6 C)  TempSrc: Oral Oral  Oral  SpO2: 100% 98% 98% 100%  Weight:      Height:        Intake/Output Summary (Last 24 hours) at 02/13/2024 1946 Last data filed at 02/13/2024 1837 Gross per 24 hour  Intake --  Output 3300 ml  Net -3300 ml   Filed Weights   01/22/24 0232 01/25/24 0452 01/29/24 0406  Weight: 135.7 kg 127.2 kg (!) 136.7 kg   Examination: Physical Exam:  Constitutional: WN/WD obese chronically ill-appearing Caucasian male who is resting Respiratory: Diminished to auscultation bilaterally, no wheezing, rales, rhonchi or crackles. Normal respiratory effort and patient is not tachypenic. No accessory muscle use.  Unlabored breathing Cardiovascular: RRR, no murmurs / rubs / gallops. S1 and S2 auscultated. No extremity edema. Abdomen: Soft, non-tender, distended secondary body habitus. Bowel sounds positive.  GU: Deferred. Musculoskeletal: No clubbing / cyanosis of digits/nails. No joint deformity upper and lower extremities. Good ROM, no contractures. Normal strength and muscle tone.  Skin: No rashes, lesions, ulcers on a limited  skin evaluation and has chronic discoloration of LE from chronic edema. No induration; Warm and dry.  Neurologic: CN 2-12 grossly intact with no focal deficits but is a little hard of hearing. Romberg sign and cerebellar reflexes not assessed.  Psychiatric: Fatigued but appears calm; When awoken and he is alert and oriented  Data Reviewed: I have personally reviewed following labs and imaging studies  CBC: Recent Labs  Lab 02/11/24 0244 02/11/24 0904 02/11/24 2157 02/12/24 1007 02/12/24 2311 02/13/24 0357 02/13/24 1127 02/13/24 1644  WBC 5.7 10.4 8.3 6.7  --  6.1  --   --   NEUTROABS 4.4 9.1* 6.9 5.5  --  4.7  --   --   HGB 11.5* 10.3* 9.5* 8.8* 8.8* 8.2* 8.0* 8.6*  HCT 33.6* 29.8* 28.2* 26.3* 26.1* 24.2* 23.6* 25.9*  MCV 87.5 87.6 91.3 90.1  --  90.3  --   --   PLT 160 182 164 158  --  144*  --   --    Basic Metabolic Panel: Recent Labs  Lab 02/09/24 0226 02/10/24 0244 02/11/24 0244 02/12/24 1007 02/13/24 0357  NA 136 136 136 134* 136  K 3.6 3.9 3.5 4.0 3.9  CL 102 103 103 103 104  CO2 23 22 23  20* 24  GLUCOSE 96 95 98 129* 94  BUN 42* 38* 34* 33* 28*  CREATININE 1.37* 1.27* 1.17 1.34* 1.35*  CALCIUM  9.1 9.2 9.1 8.6* 8.8*  MG 2.2 2.1 2.0 1.9 1.9  PHOS 3.6 3.7 3.2 2.9 3.2   GFR: Estimated Creatinine Clearance: 71.8 mL/min (A) (by C-G formula based on SCr of 1.35 mg/dL (H)). Liver Function Tests: Recent Labs  Lab 02/09/24 0226 02/10/24 0244 02/11/24 0244 02/12/24 1007 02/13/24 0357  AST 18 16 19 16 16   ALT 22 20 22 18 18   ALKPHOS 75 73 74 64 65  BILITOT 0.5 0.6 0.5 0.4 0.5  PROT 6.4* 6.3* 6.4* 5.8* 5.7*  ALBUMIN 3.3* 3.3* 3.3* 3.1* 3.2*   No results for input(s): LIPASE, AMYLASE in the last 168 hours. No results for input(s): AMMONIA in the last 168 hours. Coagulation Profile: No results for input(s): INR, PROTIME in the last 168 hours. Cardiac Enzymes: No results for input(s): CKTOTAL, CKMB, CKMBINDEX, TROPONINI in the last 168  hours. BNP (last 3 results) No results for input(s): PROBNP in the last 8760  hours. HbA1C: No results for input(s): HGBA1C in the last 72 hours. CBG: Recent Labs  Lab 02/11/24 0747  GLUCAP 144*   Lipid Profile: No results for input(s): CHOL, HDL, LDLCALC, TRIG, CHOLHDL, LDLDIRECT in the last 72 hours. Thyroid  Function Tests: No results for input(s): TSH, T4TOTAL, FREET4, T3FREE, THYROIDAB in the last 72 hours. Anemia Panel: No results for input(s): VITAMINB12, FOLATE, FERRITIN, TIBC, IRON , RETICCTPCT in the last 72 hours. Sepsis Labs: No results for input(s): PROCALCITON, LATICACIDVEN in the last 168 hours.  No results found for this or any previous visit (from the past 240 hours).   Radiology Studies: No results found.  Scheduled Meds:  budesonide -glycopyrrolate -formoterol   2 puff Inhalation BID   carbamide peroxide  5 drop Right EAR BID   Chlorhexidine  Gluconate Cloth  6 each Topical Daily   feeding supplement  237 mL Oral BID BM   Gerhardt's butt cream   Topical BID   multivitamin with minerals  1 tablet Oral Daily   mouth rinse  15 mL Mouth Rinse TID   pantoprazole  (PROTONIX ) IV  40 mg Intravenous Q12H   sucralfate   1 g Oral TID WC & HS   Continuous Infusions:  heparin  2,000 Units/hr (02/13/24 1126)    LOS: 27 days   Alejandro Marker, DO [facility] Available via Epic secure chat 7am-7pm After these hours, please refer to coverage provider listed on [email or web address] 02/13/2024, 7:46 PM  "

## 2024-02-13 NOTE — Progress Notes (Signed)
 Subjective: Last documented BM 02/12/24 at 10 am, black, red, mushy.  Objective: Vital signs in last 24 hours: Temp:  [97.6 F (36.4 C)-98.6 F (37 C)] 97.8 F (36.6 C) (01/13 0715) Pulse Rate:  [76-91] 76 (01/13 0715) Resp:  [18] 18 (01/13 0715) BP: (108-137)/(65-91) 114/76 (01/13 0715) SpO2:  [97 %-100 %] 98 % (01/13 0749) Weight change:  Last BM Date : 02/12/24  PE: Lying comfortably on bed GENERAL: not in distress  ABDOMEN: Dressing applied over surgical incision, nontender, bowel sounds normal active EXTREMITIES: no deformity  Lab Results: Results for orders placed or performed during the hospital encounter of 01/16/24 (from the past 48 hours)  CBC with Differential/Platelet     Status: Abnormal   Collection Time: 02/11/24  9:04 AM  Result Value Ref Range   WBC 10.4 4.0 - 10.5 K/uL   RBC 3.40 (L) 4.22 - 5.81 MIL/uL   Hemoglobin 10.3 (L) 13.0 - 17.0 g/dL   HCT 70.1 (L) 60.9 - 47.9 %   MCV 87.6 80.0 - 100.0 fL   MCH 30.3 26.0 - 34.0 pg   MCHC 34.6 30.0 - 36.0 g/dL   RDW 84.8 88.4 - 84.4 %   Platelets 182 150 - 400 K/uL   nRBC 0.0 0.0 - 0.2 %   Neutrophils Relative % 87 %   Neutro Abs 9.1 (H) 1.7 - 7.7 K/uL   Lymphocytes Relative 5 %   Lymphs Abs 0.5 (L) 0.7 - 4.0 K/uL   Monocytes Relative 6 %   Monocytes Absolute 0.6 0.1 - 1.0 K/uL   Eosinophils Relative 1 %   Eosinophils Absolute 0.1 0.0 - 0.5 K/uL   Basophils Relative 0 %   Basophils Absolute 0.0 0.0 - 0.1 K/uL   Immature Granulocytes 1 %   Abs Immature Granulocytes 0.10 (H) 0.00 - 0.07 K/uL    Comment: Performed at [facility], [address] [place], [place] [address]  CBC with Differential/Platelet     Status: Abnormal   Collection Time: 02/11/24  9:57 PM  Result Value Ref Range   WBC 8.3 4.0 - 10.5 K/uL   RBC 3.09 (L) 4.22 - 5.81 MIL/uL   Hemoglobin 9.5 (L) 13.0 - 17.0 g/dL   HCT 71.7 (L) 60.9 - 47.9 %   MCV 91.3 80.0 - 100.0 fL   MCH 30.7 26.0 - 34.0 pg   MCHC 33.7 30.0 - 36.0 g/dL   RDW  84.4 88.4 - 84.4 %   Platelets 164 150 - 400 K/uL   nRBC 0.0 0.0 - 0.2 %   Neutrophils Relative % 82 %   Neutro Abs 6.9 1.7 - 7.7 K/uL   Lymphocytes Relative 7 %   Lymphs Abs 0.6 (L) 0.7 - 4.0 K/uL   Monocytes Relative 7 %   Monocytes Absolute 0.6 0.1 - 1.0 K/uL   Eosinophils Relative 2 %   Eosinophils Absolute 0.2 0.0 - 0.5 K/uL   Basophils Relative 1 %   Basophils Absolute 0.0 0.0 - 0.1 K/uL   Immature Granulocytes 1 %   Abs Immature Granulocytes 0.05 0.00 - 0.07 K/uL    Comment: Performed at [facility], [address], [place], [place] [address]  Heparin  level (unfractionated)     Status: Abnormal   Collection Time: 02/12/24 10:07 AM  Result Value Ref Range   Heparin  Unfractionated <0.10 (L) 0.30 - 0.70 IU/mL    Comment: (NOTE) The clinical reportable range upper limit is being lowered to >1.10 to align with  the FDA approved guidance for the current laboratory assay.  If heparin  results are below expected values, and patient dosage has  been confirmed, suggest follow up testing of antithrombin III levels. Performed at [facility], [address], [place], [place] [address]   CBC with Differential/Platelet     Status: Abnormal   Collection Time: 02/12/24 10:07 AM  Result Value Ref Range   WBC 6.7 4.0 - 10.5 K/uL   RBC 2.92 (L) 4.22 - 5.81 MIL/uL   Hemoglobin 8.8 (L) 13.0 - 17.0 g/dL   HCT 73.6 (L) 60.9 - 47.9 %   MCV 90.1 80.0 - 100.0 fL   MCH 30.1 26.0 - 34.0 pg   MCHC 33.5 30.0 - 36.0 g/dL   RDW 84.2 (H) 88.4 - 84.4 %   Platelets 158 150 - 400 K/uL   nRBC 0.0 0.0 - 0.2 %   Neutrophils Relative % 82 %   Neutro Abs 5.5 1.7 - 7.7 K/uL   Lymphocytes Relative 7 %   Lymphs Abs 0.5 (L) 0.7 - 4.0 K/uL   Monocytes Relative 7 %   Monocytes Absolute 0.5 0.1 - 1.0 K/uL   Eosinophils Relative 3 %   Eosinophils Absolute 0.2 0.0 - 0.5 K/uL   Basophils Relative 0 %   Basophils Absolute 0.0 0.0 - 0.1 K/uL   Immature Granulocytes 1 %   Abs Immature Granulocytes  0.05 0.00 - 0.07 K/uL    Comment: Performed at [facility], [address], [place], [place] [address]  Comprehensive metabolic panel with GFR     Status: Abnormal   Collection Time: 02/12/24 10:07 AM  Result Value Ref Range   Sodium 134 (L) 135 - 145 mmol/L   Potassium 4.0 3.5 - 5.1 mmol/L   Chloride 103 98 - 111 mmol/L   CO2 20 (L) 22 - 32 mmol/L   Glucose, Bld 129 (H) 70 - 99 mg/dL    Comment: Glucose reference range applies only to samples taken after fasting for at least 8 hours.   BUN 33 (H) 8 - 23 mg/dL   Creatinine, Ser 8.65 (H) 0.61 - 1.24 mg/dL   Calcium  8.6 (L) 8.9 - 10.3 mg/dL   Total Protein 5.8 (L) 6.5 - 8.1 g/dL   Albumin 3.1 (L) 3.5 - 5.0 g/dL   AST 16 15 - 41 U/L   ALT 18 0 - 44 U/L   Alkaline Phosphatase 64 38 - 126 U/L   Total Bilirubin 0.4 0.0 - 1.2 mg/dL   GFR, Estimated 57 (L) >60 mL/min    Comment: (NOTE) Calculated using the CKD-EPI Creatinine Equation (2021)    Anion gap 10 5 - 15    Comment: Performed at [facility], [address], [place], [place] [address]  Magnesium      Status: None   Collection Time: 02/12/24 10:07 AM  Result Value Ref Range   Magnesium  1.9 1.7 - 2.4 mg/dL    Comment: Performed at [facility], [address] [place], [place] [address]  Phosphorus     Status: None   Collection Time: 02/12/24 10:07 AM  Result Value Ref Range   Phosphorus 2.9 2.5 - 4.6 mg/dL    Comment: Performed at [facility], [address], [place], [place] [address]  Hemoglobin and hematocrit, blood     Status: Abnormal   Collection Time: 02/12/24 11:11 PM  Result Value Ref Range   Hemoglobin 8.8 (L) 13.0 - 17.0 g/dL  HCT 26.1 (L) 39.0 - 52.0 %    Comment: Performed at [facility], [address] [place], [place] [address]  Heparin  level (unfractionated)     Status: Abnormal   Collection Time: 02/13/24  3:57 AM  Result Value Ref Range   Heparin  Unfractionated <0.10 (L) 0.30 - 0.70 IU/mL    Comment: (NOTE) The  clinical reportable range upper limit is being lowered to >1.10 to align with the FDA approved guidance for the current laboratory assay.  If heparin  results are below expected values, and patient dosage has  been confirmed, suggest follow up testing of antithrombin III levels. Performed at [facility], [address], [place], [place] [address]   Comprehensive metabolic panel with GFR     Status: Abnormal   Collection Time: 02/13/24  3:57 AM  Result Value Ref Range   Sodium 136 135 - 145 mmol/L   Potassium 3.9 3.5 - 5.1 mmol/L   Chloride 104 98 - 111 mmol/L   CO2 24 22 - 32 mmol/L   Glucose, Bld 94 70 - 99 mg/dL    Comment: Glucose reference range applies only to samples taken after fasting for at least 8 hours.   BUN 28 (H) 8 - 23 mg/dL   Creatinine, Ser 8.64 (H) 0.61 - 1.24 mg/dL   Calcium  8.8 (L) 8.9 - 10.3 mg/dL   Total Protein 5.7 (L) 6.5 - 8.1 g/dL   Albumin 3.2 (L) 3.5 - 5.0 g/dL   AST 16 15 - 41 U/L   ALT 18 0 - 44 U/L   Alkaline Phosphatase 65 38 - 126 U/L   Total Bilirubin 0.5 0.0 - 1.2 mg/dL   GFR, Estimated 56 (L) >60 mL/min    Comment: (NOTE) Calculated using the CKD-EPI Creatinine Equation (2021)    Anion gap 9 5 - 15    Comment: Performed at [facility], [address] [place], [place] [address]  CBC with Differential/Platelet     Status: Abnormal   Collection Time: 02/13/24  3:57 AM  Result Value Ref Range   WBC 6.1 4.0 - 10.5 K/uL   RBC 2.68 (L) 4.22 - 5.81 MIL/uL   Hemoglobin 8.2 (L) 13.0 - 17.0 g/dL   HCT 75.7 (L) 60.9 - 47.9 %   MCV 90.3 80.0 - 100.0 fL   MCH 30.6 26.0 - 34.0 pg   MCHC 33.9 30.0 - 36.0 g/dL   RDW 84.2 (H) 88.4 - 84.4 %   Platelets 144 (L) 150 - 400 K/uL   nRBC 0.0 0.0 - 0.2 %   Neutrophils Relative % 75 %   Neutro Abs 4.7 1.7 - 7.7 K/uL   Lymphocytes Relative 10 %   Lymphs Abs 0.6 (L) 0.7 - 4.0 K/uL   Monocytes Relative 9 %   Monocytes Absolute 0.5 0.1 - 1.0 K/uL   Eosinophils Relative 4 %   Eosinophils Absolute  0.2 0.0 - 0.5 K/uL   Basophils Relative 1 %   Basophils Absolute 0.0 0.0 - 0.1 K/uL   Immature Granulocytes 1 %   Abs Immature Granulocytes 0.04 0.00 - 0.07 K/uL    Comment: Performed at [facility], [address] [place], [place] [address]  Phosphorus     Status: None   Collection Time: 02/13/24  3:57 AM  Result Value Ref Range   Phosphorus 3.2 2.5 - 4.6 mg/dL    Comment: Performed at [facility], [address] [place], [place] [address]  Magnesium      Status: None   Collection Time: 02/13/24  3:57 AM  Result Value Ref Range   Magnesium  1.9 1.7 - 2.4 mg/dL    Comment: Performed at [facility], [address], [place], [place] [address]    Studies/Results: CT ANGIO GI BLEED Result Date: 02/11/2024 CLINICAL DATA:  Gastrointestinal bleeding, negative EGD EXAM: CTA ABDOMEN AND PELVIS WITHOUT AND WITH CONTRAST TECHNIQUE: Multidetector CT imaging of the abdomen and pelvis was performed using the standard protocol during bolus administration of intravenous contrast. Multiplanar reconstructed images and MIPs were obtained and reviewed to evaluate the vascular anatomy. RADIATION DOSE REDUCTION: This exam was performed according to the departmental dose-optimization program which includes automated exposure control, adjustment of the mA and/or kV according to patient size and/or use of iterative reconstruction technique. CONTRAST:  OMNIPAQUE  IOHEXOL  350 MG/ML SOLN COMPARISON:  02/03/2024 FINDINGS: VASCULAR Aorta: Normal caliber aorta without aneurysm, dissection, vasculitis or significant stenosis. Diffuse atherosclerosis. Celiac: Patent without evidence of aneurysm, dissection, vasculitis or significant stenosis. Stable embolic coils within the gastroduodenal artery distribution from prior embolization procedure. SMA: Patent without evidence of aneurysm, dissection, vasculitis or significant stenosis. Mild atherosclerosis. Renals: Both renal arteries are patent without  evidence of aneurysm, dissection, vasculitis, fibromuscular dysplasia or significant stenosis. Mild atherosclerosis. IMA: Patent without evidence of aneurysm, dissection, vasculitis or significant stenosis. Inflow: Patent without evidence of aneurysm, dissection, vasculitis or significant stenosis. Mild atherosclerosis. Proximal Outflow: Bilateral common femoral and visualized portions of the superficial and profunda femoral arteries are patent without evidence of aneurysm, dissection, vasculitis or significant stenosis. Veins: No obvious venous abnormality within the limitations of this arterial phase study. Review of the MIP images confirms the above findings. NON-VASCULAR Lower chest: Small left pleural effusion. No acute airspace disease. Hepatobiliary: No focal liver abnormality is seen. No gallstones, gallbladder wall thickening, or biliary dilatation. Pancreas: Unremarkable. No pancreatic ductal dilatation or surrounding inflammatory changes. Spleen: Normal in size without focal abnormality. Adrenals/Urinary Tract: Stable right adrenal myelolipoma. Left adrenal is unremarkable. Stable appearance of the bilateral kidneys. No urinary tract calculi or obstruction. Evaluation of the bladder is limited due to decompression with a Foley catheter. There is persistent bladder wall thickening with multiple bladder diverticula compatible with sequela of chronic bladder outlet obstruction. Stomach/Bowel: No bowel obstruction or ileus. Normal appendix right lower quadrant. Diffuse colonic diverticulosis. Mild wall thickening and pericolonic fat stranding at the sigmoid colon has improved since prior study, consistent with resolving diverticulitis. No perforation, fluid collection, or abscess. No intraluminal contrast accumulation to suggest active gastrointestinal hemorrhage. Lymphatic: No pathologic adenopathy. Reproductive: Dense calcifications within the prostate. Other: No free fluid or free intraperitoneal gas. No  abdominal wall hernia. Musculoskeletal: No acute or destructive bony abnormalities. Stable chronic L1 compression deformity. Reconstructed images demonstrate no additional findings. IMPRESSION: VASCULAR 1. No evidence of active gastrointestinal bleeding. 2.  Aortic Atherosclerosis (ICD10-I70.0). NON-VASCULAR 1. Colonic diverticulosis, with improving diverticulitis of the sigmoid colon. No perforation, fluid collection, or abscess. 2. Stable right adrenal myelolipoma. 3. Stable sequela of chronic bladder outlet obstruction. The bladder is decompressed with a Foley catheter. 4. Small left pleural effusion. Electronically Signed   By: Ozell Daring M.D.   On: 02/11/2024 15:30    Medications: I have reviewed the patient's current medications.  Assessment: Ongoing concern for GI bleed from known duodenal bulb ulcer  Hb trend: 8.2/8.8/8.8/9.5/10.3/11.5/11.09/12/10.3/12.8 S/P 5 units PRBC tranfusion on 01/17/24 and 1 unit cryoprecipitate transfusion  EGD 09/09/23, Dr.Rourk: 1.5 cm cratered ulcer with  clot, injected with epinephrine   IR guided GDA microembolization, 09/09/23 of large cratered ulcer causing upper GI bleed  CT angio 01/06/2024: No active GI bleed, extensive colonic diverticulosis  EGD 01/08/24, Dr.Charles Craver: Nonbleeding cratered clean-based duodenal bulb ulcer 6 to 8 mm with central protuberance, no H. Pylori  EGD 01/17/24, Dr.Charles Craver: Large amount of fresh blood in the entire stomach, duodenum, 1 hemostatic placed into duodenal polyp with use of hemospray  Exploratory laparotomy, duodenotomy, ligation of GDA branches, wound VAC placement 01/17/2024 duodenal ulcer hemorrhage  Upper GI series 01/26/2024: Large duodenal diverticulum, no leak  CT angio 02/03/2024: No active GI bleed, diverticulosis with inflammatory change in proximal sigmoid colon  EGD 02/05/24, Dr.Cunningham: Suture material in duodenal bulb diffusely friable mucosa without active bleeding  EGD 02/11/24:  Dr.Cunnigham: Suture material in duodenal bulb, large nonbleeding diverticulum in second portion duodenum  CT angiogram 02/11/2024: Colonic diverticulosis with diverticulitis of sigmoid colon, no active GI bleed  Comorbidities: Stage IV lung cancer(differentiated squamous cell, diagnosed 06/05/2023) with adrenal metastasis, CVA, COPD, sleep apnea with CPAP, permanent atrial fibrillation, right upper extremity edema and facial vein thrombosis   Plan: On protonix  40 mg BID and sucralfate  1 gm QID. Discussed with patient's specialist, Dr. Sherrill, to resume heparin , monitor H&H and for signs of melena/hematochezia. With resumption of heparin , patient does not have any further evidence of ongoing GI bleed and hemoglobin is stable, consider resuming Eliquis  in the next 3 to 5 days.  Estelita Manas, MD 02/13/2024, 7:58 AM

## 2024-02-13 NOTE — Progress Notes (Signed)
 PHARMACY - ANTICOAGULATION CONSULT NOTE  Pharmacy Consult for heparin   Indication: atrial fibrillation and chronic RUE DVT  Allergies[1]  Patient Measurements: Height: 6' 0.01 (182.9 cm) Weight: (!) 136.7 kg (301 lb 5.9 oz) IBW/kg (Calculated) : 77.62 HEPARIN  DW (KG): 103.8  Vital Signs: Temp: 97.8 F (36.6 C) (01/13 0715) Temp Source: Oral (01/13 0715) BP: 114/76 (01/13 0715) Pulse Rate: 76 (01/13 0715)  Labs: Recent Labs    02/11/24 0244 02/11/24 0904 02/11/24 2157 02/12/24 1007 02/12/24 2311 02/13/24 0357  HGB 11.5*   < > 9.5* 8.8* 8.8* 8.2*  HCT 33.6*   < > 28.2* 26.3* 26.1* 24.2*  PLT 160   < > 164 158  --  144*  HEPARINUNFRC 0.53  --   --  <0.10*  --  <0.10*  CREATININE 1.17  --   --  1.34*  --  1.35*   < > = values in this interval not displayed.    Estimated Creatinine Clearance: 71.8 mL/min (A) (by C-G formula based on SCr of 1.35 mg/dL (H)).   Assessment: 14 YOM w/ Hx AF with a RUE DVT in need of AC. Apixaban  PTA. Last dose was >7 days ago. He was admitted to [facility] w/ duodenal ulcer hemorrhage which was clipped on 12/17.   Resuming heparin , with plans to monitor Hgb for 3-5 days while on heparin  before resuming apixaban .  Heparin  level previously therapeutic on 2000-2100 units/hr.  Will target low end of goal given bleed risk.  Goal of Therapy:  Heparin  level 0.3-0.5 units/mL Monitor platelets by anticoagulation protocol: Yes   Plan:  Start heparin  infusion at 2000 units/hr Heparin  level in 8 hours Check heparin  level daily while on heparin  Continue to monitor H&H and platelets  [employer], Pharm.D., BCPS Clinical Pharmacist Clinical phone for 02/13/2024 from 7:30-3:00 is [phone].  **Pharmacist phone directory can be found on [email or web address] listed under [facility].  02/13/2024 9:38 AM     [1] No Known Allergies

## 2024-02-14 DIAGNOSIS — I482 Chronic atrial fibrillation, unspecified: Secondary | ICD-10-CM | POA: Diagnosis not present

## 2024-02-14 DIAGNOSIS — G4733 Obstructive sleep apnea (adult) (pediatric): Secondary | ICD-10-CM | POA: Diagnosis not present

## 2024-02-14 DIAGNOSIS — D62 Acute posthemorrhagic anemia: Secondary | ICD-10-CM | POA: Diagnosis not present

## 2024-02-14 DIAGNOSIS — K922 Gastrointestinal hemorrhage, unspecified: Secondary | ICD-10-CM | POA: Diagnosis not present

## 2024-02-14 LAB — COMPREHENSIVE METABOLIC PANEL WITH GFR
ALT: 16 U/L (ref 0–44)
AST: 17 U/L (ref 15–41)
Albumin: 3.3 g/dL — ABNORMAL LOW (ref 3.5–5.0)
Alkaline Phosphatase: 64 U/L (ref 38–126)
Anion gap: 11 (ref 5–15)
BUN: 22 mg/dL (ref 8–23)
CO2: 23 mmol/L (ref 22–32)
Calcium: 8.9 mg/dL (ref 8.9–10.3)
Chloride: 105 mmol/L (ref 98–111)
Creatinine, Ser: 1.19 mg/dL (ref 0.61–1.24)
GFR, Estimated: >60 mL/min
Glucose, Bld: 96 mg/dL (ref 70–99)
Potassium: 3.5 mmol/L (ref 3.5–5.1)
Sodium: 138 mmol/L (ref 135–145)
Total Bilirubin: 0.4 mg/dL (ref 0.0–1.2)
Total Protein: 5.7 g/dL — ABNORMAL LOW (ref 6.5–8.1)

## 2024-02-14 LAB — CBC WITH DIFFERENTIAL/PLATELET
Abs Immature Granulocytes: 0.03 K/uL (ref 0.00–0.07)
Basophils Absolute: 0 K/uL (ref 0.0–0.1)
Basophils Relative: 0 %
Eosinophils Absolute: 0.2 K/uL (ref 0.0–0.5)
Eosinophils Relative: 4 %
HCT: 24.1 % — ABNORMAL LOW (ref 39.0–52.0)
Hemoglobin: 8 g/dL — ABNORMAL LOW (ref 13.0–17.0)
Immature Granulocytes: 1 %
Lymphocytes Relative: 11 %
Lymphs Abs: 0.7 K/uL (ref 0.7–4.0)
MCH: 30.1 pg (ref 26.0–34.0)
MCHC: 33.2 g/dL (ref 30.0–36.0)
MCV: 90.6 fL (ref 80.0–100.0)
Monocytes Absolute: 0.5 K/uL (ref 0.1–1.0)
Monocytes Relative: 9 %
Neutro Abs: 4.5 K/uL (ref 1.7–7.7)
Neutrophils Relative %: 75 %
Platelets: 137 K/uL — ABNORMAL LOW (ref 150–400)
RBC: 2.66 MIL/uL — ABNORMAL LOW (ref 4.22–5.81)
RDW: 16.3 % — ABNORMAL HIGH (ref 11.5–15.5)
WBC: 6 K/uL (ref 4.0–10.5)
nRBC: 0 % (ref 0.0–0.2)

## 2024-02-14 LAB — HEPARIN LEVEL (UNFRACTIONATED): Heparin Unfractionated: 0.57 [IU]/mL (ref 0.30–0.70)

## 2024-02-14 LAB — MAGNESIUM: Magnesium: 1.9 mg/dL (ref 1.7–2.4)

## 2024-02-14 LAB — PHOSPHORUS: Phosphorus: 3.2 mg/dL (ref 2.5–4.6)

## 2024-02-14 NOTE — Progress Notes (Signed)
 "          [name]                                                                              Raymond Hurst, is a 74 y.o. male, DOB - September 08, 1950, FMW:[id number] Admit date - 01/16/2024    Outpatient Primary MD for the patient is [name], MD  LOS - 28  days  Chief Complaint  Patient presents with   Hematemesis       Brief summary   Patient is a 74 year old male with Left lung mass bronchogenic CA (poorly differentiated squamous cell) diagnosed 06/05/2023-bronchoscopy 08/09/2023 complicated by left PNA thorax-follows with Dr. [name], A-fib on Eliquis , previous alcohol use, UGIB 09/09/2023 cratered ulcer duodenum status post mesenteric angio/embolization at [facility].  Readmission 12/6-12/8 with upper GI bleed.  EGD clean-based ulcer told to continue twice daily PPI manage resumed Eliquis  hemoglobin was 9.6 on discharge at that time   Significant Events: 12/16 [facility] ED upper GI bleed with bright red vomiting  12/17 : Transferred to [facility].  Status post duodenal bleed Clip by IR.  Continued ongoing bleeding requiring pressors, massive transfusion protocol, emergent ex lap Dr. Rubin with duodenotomy ligation GDA branches VAC placement 12/19 Secondary closure of abdomen Dr. Ebbie  12/26 barium swallow performed and upper GI 1226 without evidence of leak.  Prolonged hospital course with intermittent bleeding.  Repeat EGD on 1/5 showed no active bleeding or high risk bleeding lesions. EGD 1/5 showed no active bleeding or high risk bleeding lesions and the bleeding was suspected to be from friable mucosa in the duodenal bulb at the site of surgical repair after DOAC started  Heparin  was stopped, underwent repeat EGD 1/11 with no active bleeding. 1/12: Hemoglobin continues to trend down, symptomatic.  GI recommended observation, no Pete colonoscopy.  Wife frustrated and requested second GI opinion, accepted graciously by GI. Cardiology consulted. 1/13/26[name]  recommended continue heparin  drip resuming heparin  drip until no further GI bleeding before resuming Eliquis    Assessment & Plan    Duodenal ulcer hemorrhage with coiling 09/2023  s/p laparotomy 12/17, secondary closure 12/19 as above -Vomiting resolved since 12/26.  Off TPN on 12/31.  JP drain removed 12/31 - Now tolerating regular diet    Recurrent GI Bleed and Symptomatic Anemia, Duodenal ulcer - Prolonged hospitalization due to intermittent GI bleeding.  Has had 5 EGDs so far, last performed on 02/11/2024. -02/03/24 and 02/11/24 CTA GIB negative for active bleeding -EGD (1/5) showed no active bleeding or high risk bleeding lesions and the bleeding was suspected to be from friable mucosa in the duodenal bulb at the site of surgical repair after DOAC started -Heparin  gtt was stopped  -Underwent Repeat EGD 02/11/24: Normal esophagus with erythematous mucosa in the gastric body with intact suture and erythematous friable mucosa but no evidence of recent bleeding for this area.  Given nonbleeding duodenal diverticulum, felt that the source of his new recurrent bleeding is from another source and possibly a diverticulum - Continue PI twice daily, sucralfate  1 g 4 times daily - Per GI, if hemoglobin remains stable for another 48 hours, consider resuming Eliquis  thereafter.   Sepsis, Staph epi  Bacteremia-MRSE with resistances Likely peritonitis - Bcx 12/21 postive for staph epi, blood cultures 12/26 showed no growth. - Completed course of antibiotics on 12/30 - RIJ CVC removed 02/02/24    AKI on admission, Metabolic Acidosis - Baseline Cr 0.7 - 0.9.  Likely worsened due to #1, hypotension, GI bleed. - Resolved, creatinine at baseline    Atrial Fibrillation:  - CHADVASC >4. - Cardiology consulted and had extensive discussion with patient and family.   - Plan to continue heparin  drip for another 48 hours.  If H&H remained stable, no further bleeding, then retrial eliquis .  Can consider Watchman  procedure but will need to demonstrate tolerability for [facility] for 45 days post watchman then dual antiplatelet therapy followed by single antiplatelet therapy.    Right Cephalic Vein Acute SVT and Right Brachial Vein Acute DVT:  - Noted on RUE doppler on 12/29 -Currently on IV heparin  drip    Hypernatremia:  - Resolved.   Hypokalemia:  - Replace as needed   Urinary Retention:  - Foley was initially removed and patient was voiding well.  - However, noted to be retaining again so Foley/indwelling cath placed and will be continue at discharge. Will need outpt f/u with Urology   Left Lung Mass Bronchogenic CA:  - Holding Immunotherapy/Chemotherapy. Will need Outpatient follow-up   Critical illness Thrombocytopenia: - Continue to follow    Moderate protein calorie malnutrition Nutrition Problem: Moderate Malnutrition Etiology: chronic illness (COPD, CVA, GIB, NSCLC) Signs/Symptoms: moderate fat depletion, severe muscle depletion, edema - Off TPN   Obesity class III  Estimated body mass index is 40.86 kg/m as calculated from the following:   Height as of this encounter: 6' 0.01 (1.829 m).   Weight as of this encounter: 136.7 kg.  Code Status: Full code DVT Prophylaxis:  Place TED hose Start: 02/11/24 0809 SCDs Start: 01/16/24 1731   Level of Care: Level of care: Telemetry Family Communication: Updated patient Disposition Plan:      Remains inpatient appropriate:      Procedures:    Consultants:   Cardiology General Surgery [facility] gastroenterology  Antimicrobials:   Anti-infectives (From admission, onward)    Start     Dose/Rate Route Frequency Ordered Stop   01/29/24 2200  fluconazole  (DIFLUCAN ) IVPB 400 mg  Status:  Discontinued        400 mg 100 mL/hr over 120 Minutes Intravenous Every 24 hours 01/29/24 1336 01/29/24 1346   01/29/24 2200  fluconazole  (DIFLUCAN ) IVPB 400 mg  Status:  Discontinued        400 mg 200 mL/hr over 60 Minutes Intravenous Every 24  hours 01/29/24 1346 01/30/24 1533   01/25/24 1500  vancomycin  (VANCOREADY) IVPB 1500 mg/300 mL  Status:  Discontinued        1,500 mg 150 mL/hr over 120 Minutes Intravenous Every 24 hours 01/24/24 1322 01/30/24 1233   01/24/24 1115  vancomycin  (VANCOCIN ) 2,500 mg in sodium chloride  0.9 % 500 mL IVPB        2,500 mg 262.5 mL/hr over 120 Minutes Intravenous  Once 01/24/24 1029 01/24/24 1530   01/17/24 2245  piperacillin -tazobactam (ZOSYN ) IVPB 3.375 g  Status:  Discontinued        3.375 g 12.5 mL/hr over 240 Minutes Intravenous Every 8 hours 01/17/24 2149 01/30/24 1233   01/17/24 2200  fluconazole  (DIFLUCAN ) IVPB 400 mg  Status:  Discontinued        400 mg 100 mL/hr over 120 Minutes Intravenous Every 24 hours 01/17/24 2059 01/29/24  1336          Medications  budesonide -glycopyrrolate -formoterol   2 puff Inhalation BID   carbamide peroxide  5 drop Right EAR BID   Chlorhexidine  Gluconate Cloth  6 each Topical Daily   feeding supplement  237 mL Oral BID BM   Gerhardt's butt cream   Topical BID   multivitamin with minerals  1 tablet Oral Daily   mouth rinse  15 mL Mouth Rinse TID   pantoprazole  (PROTONIX ) IV  40 mg Intravenous Q12H   sucralfate   1 g Oral TID WC & HS      Subjective:   Raymond Hurst was seen and examined today.  No acute complaints.  Currently no nausea vomiting, abdominal pain, dizziness or GI bleed.  Requested thigh-high compression stockings. Objective:   Vitals:   02/13/24 2006 02/14/24 0600 02/14/24 0716 02/14/24 0816  BP:  97/71 117/70   Pulse:   90   Resp:  18 18   Temp:  97.9 F (36.6 C) 97.7 F (36.5 C)   TempSrc:  Oral Oral   SpO2: 100% 98% 100% 100%  Weight:      Height:        Intake/Output Summary (Last 24 hours) at 02/14/2024 1531 Last data filed at 02/14/2024 0715 Gross per 24 hour  Intake 343.25 ml  Output 2350 ml  Net -2006.75 ml     Wt Readings from Last 3 Encounters:  01/29/24 (!) 136.7 kg  01/12/24 119.7 kg  01/06/24 118.2 kg      Exam General: Alert and oriented x 3, NAD Cardiovascular: S1 S2 auscultated,  RRR Respiratory: Clear to auscultation bilaterally, no wheezing Gastrointestinal: Soft, nontender, nondistended, + bowel sounds Ext: no pedal edema bilaterally Neuro: No new deficits Psych: Normal affect     Data Reviewed:  I have personally reviewed following labs    CBC Lab Results  Component Value Date   WBC 6.0 02/14/2024   RBC 2.66 (L) 02/14/2024   HGB 8.0 (L) 02/14/2024   HCT 24.1 (L) 02/14/2024   MCV 90.6 02/14/2024   MCH 30.1 02/14/2024   PLT 137 (L) 02/14/2024   MCHC 33.2 02/14/2024   RDW 16.3 (H) 02/14/2024   LYMPHSABS 0.7 02/14/2024   MONOABS 0.5 02/14/2024   EOSABS 0.2 02/14/2024   BASOSABS 0.0 02/14/2024     Last metabolic panel Lab Results  Component Value Date   NA 138 02/14/2024   K 3.5 02/14/2024   CL 105 02/14/2024   CO2 23 02/14/2024   BUN 22 02/14/2024   CREATININE 1.19 02/14/2024   GLUCOSE 96 02/14/2024   GFRNONAA >60 02/14/2024   GFRAA >60 06/13/2019   CALCIUM  8.9 02/14/2024   PHOS 3.2 02/14/2024   PROT 5.7 (L) 02/14/2024   ALBUMIN 3.3 (L) 02/14/2024   LABGLOB 3.2 06/01/2023   AGRATIO 1.3 07/21/2020   BILITOT 0.4 02/14/2024   ALKPHOS 64 02/14/2024   AST 17 02/14/2024   ALT 16 02/14/2024   ANIONGAP 11 02/14/2024    CBG (last 3)  No results for input(s): GLUCAP in the last 72 hours.    Coagulation Profile: No results for input(s): INR, PROTIME in the last 168 hours.   Radiology Studies: I have personally reviewed the imaging studies  No results found.     Nydia Distance M.D. [name] 02/14/2024, 3:31 PM  Available via Epic secure chat 7am-7pm After 7 pm, please refer to night coverage provider listed on amion.    "

## 2024-02-14 NOTE — TOC Progression Note (Signed)
 Transition of Care [facility]) - Progression Note    Patient Details  Name: Raymond Hurst MRN: [id number] Date of Birth: 1950/04/21  Transition of Care [facility] CM/SW Contact  Lendia Dais, [place] Phone Number: 02/14/2024, 1:50 PM  Clinical Narrative:  Pt is not yet medically stable. Pt's hemoglobin levels are unstable and still on Heparin . Plan is still SNF at [facility]. CSW will f/u w/ SNF once the pt is closer to being medically stable.  CSW will continue to monitor.      Barriers to Discharge: Continued Medical Work up               Expected Discharge Plan and Services                                               Social Drivers of Health (SDOH) Interventions SDOH Screenings   Food Insecurity: No Food Insecurity (01/16/2024)  Housing: Low Risk (01/26/2024)  Transportation Needs: No Transportation Needs (01/16/2024)  Utilities: Not At Risk (01/16/2024)  Alcohol Screen: Low Risk (06/15/2023)  Depression (PHQ2-9): Low Risk (01/12/2024)  Financial Resource Strain: High Risk (06/15/2023)  Physical Activity: Inactive (06/15/2023)  Social Connections: Moderately Isolated (01/16/2024)  Stress: No Stress Concern Present (06/15/2023)  Tobacco Use: Medium Risk (02/11/2024)    Readmission Risk Interventions    09/08/2023   12:09 PM  Readmission Risk Prevention Plan  Transportation Screening Complete  HRI or Home Care Consult Complete  Social Work Consult for Recovery Care Planning/Counseling Complete  Palliative Care Screening Not Applicable  Medication Review Oceanographer) Complete

## 2024-02-14 NOTE — Plan of Care (Signed)
" °  Problem: Activity: Goal: Risk for activity intolerance will decrease Outcome: Progressing   Problem: Pain Managment: Goal: General experience of comfort will improve and/or be controlled Outcome: Progressing   Problem: Coping: Goal: Level of anxiety will decrease Outcome: Progressing   Problem: Skin Integrity: Goal: Risk for impaired skin integrity will decrease Outcome: Progressing   "

## 2024-02-14 NOTE — Progress Notes (Signed)
 Physical Therapy Treatment Patient Details Name: Raymond Hurst MRN: [id number] DOB: 1950-07-14 Today's Date: 02/14/2024   History of Present Illness 74 y.o. male admitted to [facility] 01/16/24 with transfer to [facility] ICU on 12/17 with dizziness, hematemesis, blood loss anemia, hemorrhagic shock. EGD 12/17 with duodenal active bleed, clipped; same day emergent ex lap and wound vac placement. S/p abdominal closure 12/19. ETT 12/17-12/23. RUE SVT and DVT noted 12/29. Off TPN 12/31; JP drain removed 12/31. Of note, recent admission 01/06/24-01/08/24 with GIB. Other PMH includes afib on Eliquis , CVA, recurrent GIB, stage IV lung CA (dx 06/2023), gout, previous ETOH abuse.    PT Comments  Pt admitted with above diagnosis. Pt was able to progress distance with ambulation with RW with CGA. Progressing each day.  Pt with less pain today. Continues to recommend post actue rehab < 3 hours day.  Pt currently with functional limitations due to the deficits listed below (see PT Problem List). Pt will benefit from acute skilled PT to increase their independence and safety with mobility to allow discharge.       If plan is discharge home, recommend the following: A little help with bathing/dressing/bathroom;Assist for transportation;Assistance with cooking/housework;A little help with walking and/or transfers   Can travel by private vehicle        Equipment Recommendations  BSC/3in1    Recommendations for Other Services       Precautions / Restrictions Precautions Precautions: Fall Recall of Precautions/Restrictions: Intact Restrictions Weight Bearing Restrictions Per Provider Order: No     Mobility  Bed Mobility Overal bed mobility: Modified Independent             General bed mobility comments: No physical assistance required to come to EOB    Transfers Overall transfer level: Needs assistance Equipment used: Rolling walker (2 wheels) Transfers: Sit to/from Stand Sit to Stand: Contact guard  assist           General transfer comment: Verbal cues for proper hand placement on RW, CGA for sit to stand.    Ambulation/Gait Ambulation/Gait assistance: Contact guard assist Gait Distance (Feet): 250 Feet Assistive device: Rolling walker (2 wheels) Gait Pattern/deviations: Step-through pattern, Decreased stride length, Trunk flexed Gait velocity: Decreased Gait velocity interpretation: <1.31 ft/sec, indicative of household ambulator   General Gait Details: Slow but generally steady with RW for support.  No LOB with incr distance today.   Stairs             Wheelchair Mobility     Tilt Bed    Modified Rankin (Stroke Patients Only)       Balance Overall balance assessment: Needs assistance Sitting-balance support: Feet supported Sitting balance-Leahy Scale: Normal Sitting balance - Comments: EOB   Standing balance support: Bilateral upper extremity supported, During functional activity, Reliant on assistive device for balance Standing balance-Leahy Scale: Fair Standing balance comment: Reliant on RW during dynamic standing tasks                            Communication Communication Communication: Impaired Factors Affecting Communication: Hearing impaired  Cognition Arousal: Alert Behavior During Therapy: WFL for tasks assessed/performed   PT - Cognitive impairments: No apparent impairments                       PT - Cognition Comments: WFL for simple tasks, not formally assessed Following commands: Intact      Cueing Cueing Techniques: Verbal cues  Exercises General Exercises - Lower Extremity Ankle Circles/Pumps: AROM, Both, 10 reps, Seated Long Arc Quad: 10 reps, Both, Supine Heel Slides: 10 reps, Both, Supine Hip Flexion/Marching: AROM, Both, 10 reps, Seated    General Comments General comments (skin integrity, edema, etc.): HR 96-138 bpm with activity      Pertinent Vitals/Pain Pain Assessment Pain Assessment:  No/denies pain    Home Living                          Prior Function            PT Goals (current goals can now be found in the care plan section) Acute Rehab PT Goals Patient Stated Goal: to improve mobiltiy Progress towards PT goals: Progressing toward goals    Frequency    Min 2X/week      PT Plan      Co-evaluation              AM-PAC PT 6 Clicks Mobility   Outcome Measure  Help needed turning from your back to your side while in a flat bed without using bedrails?: None Help needed moving from lying on your back to sitting on the side of a flat bed without using bedrails?: None Help needed moving to and from a bed to a chair (including a wheelchair)?: A Little Help needed standing up from a chair using your arms (e.g., wheelchair or bedside chair)?: A Little Help needed to walk in hospital room?: A Little Help needed climbing 3-5 steps with a railing? : A Little 6 Click Score: 20    End of Session Equipment Utilized During Treatment: Gait belt Activity Tolerance: Patient tolerated treatment well Patient left: in chair;with call bell/phone within reach;with chair alarm set Nurse Communication: Mobility status PT Visit Diagnosis: Unsteadiness on feet (R26.81);Other abnormalities of gait and mobility (R26.89);Muscle weakness (generalized) (M62.81)     Time: 8981-8968 PT Time Calculation (min) (ACUTE ONLY): 13 min  Charges:    $Gait Training: 8-22 mins PT General Charges $$ ACUTE PT VISIT: 1 Visit                     Raymond Hurst,PT Acute Rehab Services [phone]    Raymond Hurst 02/14/2024, 2:52 PM

## 2024-02-14 NOTE — Progress Notes (Signed)
 PHARMACY - ANTICOAGULATION CONSULT NOTE  Pharmacy Consult for heparin   Indication: atrial fibrillation and chronic RUE DVT  Allergies[1]  Patient Measurements: Height: 6' 0.01 (182.9 cm) Weight: (!) 136.7 kg (301 lb 5.9 oz) IBW/kg (Calculated) : 77.62 HEPARIN  DW (KG): 103.8  Vital Signs: Temp: 97.7 F (36.5 C) (01/14 0716) Temp Source: Oral (01/14 0716) BP: 117/70 (01/14 0716) Pulse Rate: 90 (01/14 0716)  Labs: Recent Labs    02/12/24 1007 02/12/24 2311 02/13/24 0357 02/13/24 1127 02/13/24 1644 02/13/24 2029 02/14/24 0328  HGB 8.8*   < > 8.2* 8.0* 8.6*  --  8.0*  HCT 26.3*   < > 24.2* 23.6* 25.9*  --  24.1*  PLT 158  --  144*  --   --   --  137*  HEPARINUNFRC <0.10*  --  <0.10*  --   --  0.43 0.57  CREATININE 1.34*  --  1.35*  --   --   --  1.19   < > = values in this interval not displayed.    Estimated Creatinine Clearance: 81.5 mL/min (by C-G formula based on SCr of 1.19 mg/dL).   Assessment: 39 YOM w/ Hx AF with a RUE DVT in need of AC. Apixaban  PTA. Last dose was >7 days ago. He was admitted to [facility] w/ duodenal ulcer hemorrhage which was clipped on 12/17.   Resuming heparin , with plans to monitor Hgb for 3-5 days while on heparin  before resuming apixaban .  Heparin  level slightly above goal on 2000 units/hr.  Will reduce rate to target low end of goal given bleed risk.  Goal of Therapy:  Heparin  level 0.3-0.5 units/mL Monitor platelets by anticoagulation protocol: Yes   Plan:  Decrease heparin  infusion to 1900 units/hr Check heparin  level daily while on heparin  Continue to monitor H&H and platelets  [employer], Pharm.D., BCPS Clinical Pharmacist Clinical phone for 02/14/2024 from 7:30-3:00 is [phone].  **Pharmacist phone directory can be found on [email or web address] listed under [facility].  02/14/2024 9:19 AM      [1] No Known Allergies

## 2024-02-14 NOTE — Progress Notes (Signed)
 Subjective: States he has not had a bowel movement today. He is tolerating regular diet. Denies abdominal pain.  Objective: Vital signs in last 24 hours: Temp:  [97.7 F (36.5 C)-97.9 F (36.6 C)] 97.7 F (36.5 C) (01/14 0716) Pulse Rate:  [74-90] 90 (01/14 0716) Resp:  [18] 18 (01/14 0716) BP: (97-117)/(67-73) 117/70 (01/14 0716) SpO2:  [98 %-100 %] 100 % (01/14 0816) Weight change:  Last BM Date : 02/12/24  PE: Sitting comfortably in bedside chair GENERAL: Mild pallor  ABDOMEN: Nondistended, nontender, normoactive bowel sounds EXTREMITIES: No deformity  Lab Results: Results for orders placed or performed during the hospital encounter of 01/16/24 (from the past 48 hours)  Hemoglobin and hematocrit, blood     Status: Abnormal   Collection Time: 02/12/24 11:11 PM  Result Value Ref Range   Hemoglobin 8.8 (L) 13.0 - 17.0 g/dL   HCT 73.8 (L) 60.9 - 47.9 %    Comment: Performed at [facility], [address], [place], [place] [address]  Heparin  level (unfractionated)     Status: Abnormal   Collection Time: 02/13/24  3:57 AM  Result Value Ref Range   Heparin  Unfractionated <0.10 (L) 0.30 - 0.70 IU/mL    Comment: (NOTE) The clinical reportable range upper limit is being lowered to >1.10 to align with the FDA approved guidance for the current laboratory assay.  If heparin  results are below expected values, and patient dosage has  been confirmed, suggest follow up testing of antithrombin III levels. Performed at [facility], [address] [place], [place] [address]   Comprehensive metabolic panel with GFR     Status: Abnormal   Collection Time: 02/13/24  3:57 AM  Result Value Ref Range   Sodium 136 135 - 145 mmol/L   Potassium 3.9 3.5 - 5.1 mmol/L   Chloride 104 98 - 111 mmol/L   CO2 24 22 - 32 mmol/L   Glucose, Bld 94 70 - 99 mg/dL    Comment: Glucose reference range applies only to samples taken after fasting for at least 8 hours.   BUN 28 (H) 8 - 23  mg/dL   Creatinine, Ser 8.64 (H) 0.61 - 1.24 mg/dL   Calcium  8.8 (L) 8.9 - 10.3 mg/dL   Total Protein 5.7 (L) 6.5 - 8.1 g/dL   Albumin 3.2 (L) 3.5 - 5.0 g/dL   AST 16 15 - 41 U/L   ALT 18 0 - 44 U/L   Alkaline Phosphatase 65 38 - 126 U/L   Total Bilirubin 0.5 0.0 - 1.2 mg/dL   GFR, Estimated 56 (L) >60 mL/min    Comment: (NOTE) Calculated using the CKD-EPI Creatinine Equation (2021)    Anion gap 9 5 - 15    Comment: Performed at [facility], [address] [place], [place] [address]  CBC with Differential/Platelet     Status: Abnormal   Collection Time: 02/13/24  3:57 AM  Result Value Ref Range   WBC 6.1 4.0 - 10.5 K/uL   RBC 2.68 (L) 4.22 - 5.81 MIL/uL   Hemoglobin 8.2 (L) 13.0 - 17.0 g/dL   HCT 75.7 (L) 60.9 - 47.9 %   MCV 90.3 80.0 - 100.0 fL   MCH 30.6 26.0 - 34.0 pg   MCHC 33.9 30.0 - 36.0 g/dL   RDW 84.2 (H) 88.4 - 84.4 %   Platelets 144 (L) 150 - 400 K/uL   nRBC 0.0 0.0 - 0.2 %   Neutrophils Relative % 75 %  Neutro Abs 4.7 1.7 - 7.7 K/uL   Lymphocytes Relative 10 %   Lymphs Abs 0.6 (L) 0.7 - 4.0 K/uL   Monocytes Relative 9 %   Monocytes Absolute 0.5 0.1 - 1.0 K/uL   Eosinophils Relative 4 %   Eosinophils Absolute 0.2 0.0 - 0.5 K/uL   Basophils Relative 1 %   Basophils Absolute 0.0 0.0 - 0.1 K/uL   Immature Granulocytes 1 %   Abs Immature Granulocytes 0.04 0.00 - 0.07 K/uL    Comment: Performed at [facility], [address], [place], [place] [address]  Phosphorus     Status: None   Collection Time: 02/13/24  3:57 AM  Result Value Ref Range   Phosphorus 3.2 2.5 - 4.6 mg/dL    Comment: Performed at [facility], [address] [place], [place] [address]  Magnesium      Status: None   Collection Time: 02/13/24  3:57 AM  Result Value Ref Range   Magnesium  1.9 1.7 - 2.4 mg/dL    Comment: Performed at [facility], [address], [place], [place] [address]  Hemoglobin and hematocrit, blood     Status: Abnormal   Collection Time:  02/13/24 11:27 AM  Result Value Ref Range   Hemoglobin 8.0 (L) 13.0 - 17.0 g/dL   HCT 76.3 (L) 60.9 - 47.9 %    Comment: Performed at [facility], [address], [place], [place] [address]  Hemoglobin and hematocrit, blood     Status: Abnormal   Collection Time: 02/13/24  4:44 PM  Result Value Ref Range   Hemoglobin 8.6 (L) 13.0 - 17.0 g/dL   HCT 74.0 (L) 60.9 - 47.9 %    Comment: Performed at [facility], [address], [place], [place] [address]  Heparin  level (unfractionated)     Status: None   Collection Time: 02/13/24  8:29 PM  Result Value Ref Range   Heparin  Unfractionated 0.43 0.30 - 0.70 IU/mL    Comment: (NOTE) The clinical reportable range upper limit is being lowered to >1.10 to align with the FDA approved guidance for the current laboratory assay.  If heparin  results are below expected values, and patient dosage has  been confirmed, suggest follow up testing of antithrombin III levels. Performed at [facility], [address], [place], [place] [address]   Heparin  level (unfractionated)     Status: None   Collection Time: 02/14/24  3:28 AM  Result Value Ref Range   Heparin  Unfractionated 0.57 0.30 - 0.70 IU/mL    Comment: (NOTE) The clinical reportable range upper limit is being lowered to >1.10 to align with the FDA approved guidance for the current laboratory assay.  If heparin  results are below expected values, and patient dosage has  been confirmed, suggest follow up testing of antithrombin III levels. Performed at [facility], [address], [place], [place] [address]   CBC with Differential/Platelet     Status: Abnormal   Collection Time: 02/14/24  3:28 AM  Result Value Ref Range   WBC 6.0 4.0 - 10.5 K/uL   RBC 2.66 (L) 4.22 - 5.81 MIL/uL   Hemoglobin 8.0 (L) 13.0 - 17.0 g/dL   HCT 75.8 (L) 60.9 - 47.9 %   MCV 90.6 80.0 - 100.0 fL   MCH 30.1 26.0 - 34.0 pg   MCHC 33.2 30.0 - 36.0 g/dL   RDW 83.6 (H) 88.4 - 84.4 %    Platelets  137 (L) 150 - 400 K/uL   nRBC 0.0 0.0 - 0.2 %   Neutrophils Relative % 75 %   Neutro Abs 4.5 1.7 - 7.7 K/uL   Lymphocytes Relative 11 %   Lymphs Abs 0.7 0.7 - 4.0 K/uL   Monocytes Relative 9 %   Monocytes Absolute 0.5 0.1 - 1.0 K/uL   Eosinophils Relative 4 %   Eosinophils Absolute 0.2 0.0 - 0.5 K/uL   Basophils Relative 0 %   Basophils Absolute 0.0 0.0 - 0.1 K/uL   Immature Granulocytes 1 %   Abs Immature Granulocytes 0.03 0.00 - 0.07 K/uL    Comment: Performed at [facility], [address], [place], [place] [address]  Comprehensive metabolic panel     Status: Abnormal   Collection Time: 02/14/24  3:28 AM  Result Value Ref Range   Sodium 138 135 - 145 mmol/L   Potassium 3.5 3.5 - 5.1 mmol/L   Chloride 105 98 - 111 mmol/L   CO2 23 22 - 32 mmol/L   Glucose, Bld 96 70 - 99 mg/dL    Comment: Glucose reference range applies only to samples taken after fasting for at least 8 hours.   BUN 22 8 - 23 mg/dL   Creatinine, Ser 8.80 0.61 - 1.24 mg/dL   Calcium  8.9 8.9 - 10.3 mg/dL   Total Protein 5.7 (L) 6.5 - 8.1 g/dL   Albumin 3.3 (L) 3.5 - 5.0 g/dL   AST 17 15 - 41 U/L   ALT 16 0 - 44 U/L   Alkaline Phosphatase 64 38 - 126 U/L   Total Bilirubin 0.4 0.0 - 1.2 mg/dL   GFR, Estimated >39 >39 mL/min    Comment: (NOTE) Calculated using the CKD-EPI Creatinine Equation (2021)    Anion gap 11 5 - 15    Comment: Performed at [facility], [address] [place], [place] [address]  Magnesium      Status: None   Collection Time: 02/14/24  3:28 AM  Result Value Ref Range   Magnesium  1.9 1.7 - 2.4 mg/dL    Comment: Performed at [facility], [address], [place], [place] [address]  Phosphorus     Status: None   Collection Time: 02/14/24  3:28 AM  Result Value Ref Range   Phosphorus 3.2 2.5 - 4.6 mg/dL    Comment: Performed at [facility], [address], [place], [place] [address]    Studies/Results: No results found.  Medications: I have  reviewed the patient's current medications.  Assessment: Duodenal ulcer, has had 5 EGDs so far, last performed on 02/11/2024 History of IR embolization and subsequent laparotomy and duodenotomy and ligation of GDA Hemodynamically stable  Hemoglobin in the last 24 hours: 8.2/8/8.6/8  Has not required blood transfusion since 01/17/2024  BUN has normalized to 22  Mild malnutrition, total protein 5.7, albumin 3.3  History of atrial fibrillation, Eliquis  on hold since 02/03/2024 Heparin  restarted yesterday  Plan: Continue to monitor, do not recommend getting labs more than once a day, continue heparin . Continue pantoprazole  40 mg twice a day and sucralfate  1 g 4 times a day. If hemoglobin remains stable for another 48 hours without evidence of GI bleed, we can consider resuming Eliquis  thereafter. I will continue to follow.   Estelita Manas, MD 02/14/2024, 12:35 PM

## 2024-02-15 DIAGNOSIS — K922 Gastrointestinal hemorrhage, unspecified: Secondary | ICD-10-CM | POA: Diagnosis not present

## 2024-02-15 DIAGNOSIS — I482 Chronic atrial fibrillation, unspecified: Secondary | ICD-10-CM | POA: Diagnosis not present

## 2024-02-15 DIAGNOSIS — G4733 Obstructive sleep apnea (adult) (pediatric): Secondary | ICD-10-CM | POA: Diagnosis not present

## 2024-02-15 LAB — CBC
HCT: 25.4 % — ABNORMAL LOW (ref 39.0–52.0)
Hemoglobin: 8.4 g/dL — ABNORMAL LOW (ref 13.0–17.0)
MCH: 30.4 pg (ref 26.0–34.0)
MCHC: 33.1 g/dL (ref 30.0–36.0)
MCV: 92 fL (ref 80.0–100.0)
Platelets: 159 K/uL (ref 150–400)
RBC: 2.76 MIL/uL — ABNORMAL LOW (ref 4.22–5.81)
RDW: 17.2 % — ABNORMAL HIGH (ref 11.5–15.5)
WBC: 6.2 K/uL (ref 4.0–10.5)
nRBC: 0 % (ref 0.0–0.2)

## 2024-02-15 LAB — HEPARIN LEVEL (UNFRACTIONATED)
Heparin Unfractionated: 0.69 [IU]/mL (ref 0.30–0.70)
Heparin Unfractionated: 0.43 [IU]/mL (ref 0.30–0.70)

## 2024-02-15 MED ORDER — PANTOPRAZOLE SODIUM 40 MG PO TBEC
40.0000 mg | DELAYED_RELEASE_TABLET | Freq: Two times a day (BID) | ORAL | Status: DC
  Administered 2024-02-15 – 2024-02-21 (×12): 40 mg via ORAL
  Filled 2024-02-15 (×13): qty 1

## 2024-02-15 MED ORDER — GLUCERNA SHAKE PO LIQD
237.0000 mL | Freq: Three times a day (TID) | ORAL | Status: DC
  Administered 2024-02-15 – 2024-02-21 (×18): 237 mL via ORAL

## 2024-02-15 NOTE — TOC Progression Note (Signed)
 Transition of Care [facility]) - Progression Note    Patient Details  Name: BRONTE KROPF MRN: [id number] Date of Birth: May 28, 1950  [facility] of [facility]) CM/SW Contact  Lendia Dais, [place] Phone Number: 02/15/2024, 4:40 PM  Clinical Narrative: Pt is not yet medically stable. Pt is currently on IV heparin  but with be switched to Eliquis  tomorrow and will be monitored for medical improvement. MD stated dc may be Sunday/Monday.  CSW contacted Kristin of [facility] to inquire if there will be a bed ready. CSW pending a response. CSW has not started auth yet due to auth typically being valid for 2-3 days. CSW will start auth tomorrow pending response of [facility].  CSW will continue to follow.        Barriers to Discharge: Continued Medical Work up               Expected Discharge Plan and Services                                               Social Drivers of Health (SDOH) Interventions SDOH Screenings   Food Insecurity: No Food Insecurity (01/16/2024)  Housing: Low Risk (01/26/2024)  Transportation Needs: No Transportation Needs (01/16/2024)  Utilities: Not At Risk (01/16/2024)  Alcohol Screen: Low Risk (06/15/2023)  Depression (PHQ2-9): Low Risk (01/12/2024)  Financial Resource Strain: High Risk (06/15/2023)  Physical Activity: Inactive (06/15/2023)  Social Connections: Moderately Isolated (01/16/2024)  Stress: No Stress Concern Present (06/15/2023)  Tobacco Use: Medium Risk (02/11/2024)    Readmission Risk Interventions    09/08/2023   12:09 PM  Readmission Risk Prevention Plan  Transportation Screening Complete  HRI or Home Care Consult Complete  Social Work Consult for Recovery Care Planning/Counseling Complete  Palliative Care Screening Not Applicable  Medication Review [employer]) Complete

## 2024-02-15 NOTE — Progress Notes (Signed)
 PHARMACY - ANTICOAGULATION CONSULT NOTE  Pharmacy Consult for heparin   Indication: atrial fibrillation and chronic RUE DVT  Allergies[1]  Patient Measurements: Height: 6' 0.01 (182.9 cm) Weight: (!) 136.7 kg (301 lb 5.9 oz) IBW/kg (Calculated) : 77.62 HEPARIN  DW (KG): 103.8  Vital Signs: Temp: 97.8 F (36.6 C) (01/15 0734) Temp Source: Oral (01/15 0734) BP: 117/61 (01/15 0734) Pulse Rate: 75 (01/15 0916)  Labs: Recent Labs    02/12/24 1007 02/12/24 2311 02/13/24 0357 02/13/24 1127 02/13/24 1644 02/13/24 2029 02/14/24 0328 02/15/24 0742  HGB 8.8*   < > 8.2*   < > 8.6*  --  8.0* 8.4*  HCT 26.3*   < > 24.2*   < > 25.9*  --  24.1* 25.4*  PLT 158  --  144*  --   --   --  137* 159  HEPARINUNFRC <0.10*  --  <0.10*  --   --  0.43 0.57 0.69  CREATININE 1.34*  --  1.35*  --   --   --  1.19  --    < > = values in this interval not displayed.    Estimated Creatinine Clearance: 80.3 mL/min (by C-G formula based on SCr of 1.19 mg/dL).   Assessment: 24 YOM w/ Hx AF with a RUE DVT in need of AC. Apixaban  PTA. Last dose was >7 days ago. He was admitted to [facility] w/ duodenal ulcer hemorrhage which was clipped on 12/17.   Resuming heparin , with plans to monitor Hgb for 3-5 days while on heparin  before resuming apixaban .  Heparin  level above goal despite rate decrease to 1900 units/hr yesterday.  Will further reduce rate to target low end of goal given bleed risk.  Goal of Therapy:  Heparin  level 0.3-0.5 units/mL Monitor platelets by anticoagulation protocol: Yes   Plan:  Decrease heparin  infusion to 1700 units/hr Check heparin  level 8hrs after rate change Check heparin  level daily while on heparin  Continue to monitor H&H and platelets Noted plans to restart Apixaban  1/16 if Hgb remains stable.  [employer], Pharm.D., BCPS Clinical Pharmacist Clinical phone for 02/15/2024 from 7:30-3:00 is [phone].  **Pharmacist phone directory can be found on [email or web address] listed under [facility]  Pharmacy.  02/15/2024 9:50 AM       [1] No Known Allergies

## 2024-02-15 NOTE — Progress Notes (Addendum)
 Subjective: Patient reports having a bowel movement today morning but denies noticing blood in stool. Last documented bowel movement is from yesterday evening described as black and red, formed.  Objective: Vital signs in last 24 hours: Temp:  [97.3 F (36.3 C)-98.3 F (36.8 C)] 97.8 F (36.6 C) (01/15 0734) Pulse Rate:  [75-100] 75 (01/15 0916) Resp:  [17-18] 18 (01/15 0734) BP: (107-120)/(61-73) 117/61 (01/15 0734) SpO2:  [99 %-100 %] 99 % (01/15 0916) Weight change:  Last BM Date : 02/14/24  PE: Not in distress GENERAL:Mild pallor ABDOMEN: Soft, nondistended, normoactive bowel sounds EXTREMITIES: No deformity  Lab Results: Results for orders placed or performed during the hospital encounter of 01/16/24 (from the past 48 hours)  Hemoglobin and hematocrit, blood     Status: Abnormal   Collection Time: 02/13/24 11:27 AM  Result Value Ref Range   Hemoglobin 8.0 (L) 13.0 - 17.0 g/dL   HCT 76.3 (L) 60.9 - 47.9 %    Comment: Performed at [facility], [address] [place], [place] [address]  Hemoglobin and hematocrit, blood     Status: Abnormal   Collection Time: 02/13/24  4:44 PM  Result Value Ref Range   Hemoglobin 8.6 (L) 13.0 - 17.0 g/dL   HCT 74.0 (L) 60.9 - 47.9 %    Comment: Performed at [facility], [address], [place], [place] [address]  Heparin  level (unfractionated)     Status: None   Collection Time: 02/13/24  8:29 PM  Result Value Ref Range   Heparin  Unfractionated 0.43 0.30 - 0.70 IU/mL    Comment: (NOTE) The clinical reportable range upper limit is being lowered to >1.10 to align with the FDA approved guidance for the current laboratory assay.  If heparin  results are below expected values, and patient dosage has  been confirmed, suggest follow up testing of antithrombin III levels. Performed at [facility], [address] [place], [place] [address]   Heparin  level (unfractionated)     Status: None   Collection Time: 02/14/24   3:28 AM  Result Value Ref Range   Heparin  Unfractionated 0.57 0.30 - 0.70 IU/mL    Comment: (NOTE) The clinical reportable range upper limit is being lowered to >1.10 to align with the FDA approved guidance for the current laboratory assay.  If heparin  results are below expected values, and patient dosage has  been confirmed, suggest follow up testing of antithrombin III levels. Performed at [facility], [address] [place], [place] [address]   CBC with Differential/Platelet     Status: Abnormal   Collection Time: 02/14/24  3:28 AM  Result Value Ref Range   WBC 6.0 4.0 - 10.5 K/uL   RBC 2.66 (L) 4.22 - 5.81 MIL/uL   Hemoglobin 8.0 (L) 13.0 - 17.0 g/dL   HCT 75.8 (L) 60.9 - 47.9 %   MCV 90.6 80.0 - 100.0 fL   MCH 30.1 26.0 - 34.0 pg   MCHC 33.2 30.0 - 36.0 g/dL   RDW 83.6 (H) 88.4 - 84.4 %   Platelets 137 (L) 150 - 400 K/uL   nRBC 0.0 0.0 - 0.2 %   Neutrophils Relative % 75 %   Neutro Abs 4.5 1.7 - 7.7 K/uL   Lymphocytes Relative 11 %   Lymphs Abs 0.7 0.7 - 4.0 K/uL   Monocytes Relative 9 %   Monocytes Absolute 0.5 0.1 - 1.0 K/uL   Eosinophils Relative 4 %   Eosinophils Absolute 0.2  0.0 - 0.5 K/uL   Basophils Relative 0 %   Basophils Absolute 0.0 0.0 - 0.1 K/uL   Immature Granulocytes 1 %   Abs Immature Granulocytes 0.03 0.00 - 0.07 K/uL    Comment: Performed at [facility], [address] [place], [place] [address]  Comprehensive metabolic panel     Status: Abnormal   Collection Time: 02/14/24  3:28 AM  Result Value Ref Range   Sodium 138 135 - 145 mmol/L   Potassium 3.5 3.5 - 5.1 mmol/L   Chloride 105 98 - 111 mmol/L   CO2 23 22 - 32 mmol/L   Glucose, Bld 96 70 - 99 mg/dL    Comment: Glucose reference range applies only to samples taken after fasting for at least 8 hours.   BUN 22 8 - 23 mg/dL   Creatinine, Ser 8.80 0.61 - 1.24 mg/dL   Calcium  8.9 8.9 - 10.3 mg/dL   Total Protein 5.7 (L) 6.5 - 8.1 g/dL   Albumin 3.3 (L) 3.5 - 5.0 g/dL   AST 17  15 - 41 U/L   ALT 16 0 - 44 U/L   Alkaline Phosphatase 64 38 - 126 U/L   Total Bilirubin 0.4 0.0 - 1.2 mg/dL   GFR, Estimated >39 >39 mL/min    Comment: (NOTE) Calculated using the CKD-EPI Creatinine Equation (2021)    Anion gap 11 5 - 15    Comment: Performed at [facility], [address], [place], [place] [address]  Magnesium      Status: None   Collection Time: 02/14/24  3:28 AM  Result Value Ref Range   Magnesium  1.9 1.7 - 2.4 mg/dL    Comment: Performed at [facility], [address] [place], [place] [address]  Phosphorus     Status: None   Collection Time: 02/14/24  3:28 AM  Result Value Ref Range   Phosphorus 3.2 2.5 - 4.6 mg/dL    Comment: Performed at [facility], [address] [place], [place] [address]  CBC     Status: Abnormal   Collection Time: 02/15/24  7:42 AM  Result Value Ref Range   WBC 6.2 4.0 - 10.5 K/uL   RBC 2.76 (L) 4.22 - 5.81 MIL/uL   Hemoglobin 8.4 (L) 13.0 - 17.0 g/dL   HCT 74.5 (L) 60.9 - 47.9 %   MCV 92.0 80.0 - 100.0 fL   MCH 30.4 26.0 - 34.0 pg   MCHC 33.1 30.0 - 36.0 g/dL   RDW 82.7 (H) 88.4 - 84.4 %   Platelets 159 150 - 400 K/uL   nRBC 0.0 0.0 - 0.2 %    Comment: Performed at [facility], [address] [place], [place] [address]  Heparin  level (unfractionated)     Status: None   Collection Time: 02/15/24  7:42 AM  Result Value Ref Range   Heparin  Unfractionated 0.69 0.30 - 0.70 IU/mL    Comment: (NOTE) The clinical reportable range upper limit is being lowered to >1.10 to align with the FDA approved guidance for the current laboratory assay.  If heparin  results are below expected values, and patient dosage has  been confirmed, suggest follow up testing of antithrombin III levels. Performed at [facility], [address], [place], [place] [address]     Studies/Results: No results found.  Medications: I have reviewed the patient's current medications.  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 tomorrow and there is no further evidence of GI bleed, okay to resume Eliquis  from tomorrow. Continue pantoprazole  twice a day and sucralfate  4 times a day. Discussed with patient's hospitalist Dr. Davia.  Estelita Manas, MD 02/15/2024, 9:47 AM

## 2024-02-15 NOTE — Progress Notes (Signed)
 Occupational Therapy Treatment Patient Details Name: Raymond Hurst MRN: [id number] DOB: 11/04/1950 Today's Date: 02/15/2024   History of present illness 74 y.o. male admitted to [facility] 01/16/24 with transfer to [facility] ICU on 12/17 with dizziness, hematemesis, blood loss anemia, hemorrhagic shock. EGD 12/17 with duodenal active bleed, clipped; same day emergent ex lap and wound vac placement. S/p abdominal closure 12/19. ETT 12/17-12/23. RUE SVT and DVT noted 12/29. Off TPN 12/31; JP drain removed 12/31. Of note, recent admission 01/06/24-01/08/24 with GIB. Other PMH includes afib on Eliquis , CVA, recurrent GIB, stage IV lung CA (dx 06/2023), gout, previous ETOH abuse.   OT comments  Pt making progress with functional goals. Pt in bed upon arrival and agreeable to work with OT, no c/o pain. Pt sat EOB very quickly then reported that he felt dizzy which subsided after ~1 minute, however pt declined walking to bathroom but agreeable to STS at EOB to RW. Pt had soiled bed linens once standing and OT assisted pt with posterior hygiene and LB bathing mod A, SPT CGA to chair during bed linen change. Pt stood at [facility] for UB selfcare, donning clean gown CGA. No further reports of dizziness during session, RN was notified. OT to continue to follow acutely to maximize level of function and safety      If plan is discharge home, recommend the following:  A lot of help with bathing/dressing/bathroom;A little help with walking and/or transfers;Assist for transportation;Help with stairs or ramp for entrance   Equipment Recommendations  BSC/3in1;Wheelchair cushion (measurements OT);Tub/shower bench    Recommendations for Other Services      Precautions / Restrictions Precautions Precautions: Fall Recall of Precautions/Restrictions: Intact Restrictions Weight Bearing Restrictions Per Provider Order: No       Mobility Bed Mobility Overal bed mobility: Modified Independent Bed Mobility: Supine to Sit, Sit to  Supine     Supine to sit: Modified independent (Device/Increase time) Sit to supine: Modified independent (Device/Increase time)   General bed mobility comments: No physical assistance required to come to EOB, reports dizziness (subsided in  ~1 minute)    Transfers Overall transfer level: Needs assistance Equipment used: Rolling walker (2 wheels) Transfers: Sit to/from Stand Sit to Stand: Contact guard assist Stand pivot transfers: Contact guard assist, Supervision         General transfer comment: Verbal cues for proper hand placement on RW, CGA for sit to stand. Pt declined walking to bathroom, Assisted pt with peri hygiene standing at EOB with RW     Balance Overall balance assessment: Needs assistance Sitting-balance support: Feet supported Sitting balance-Leahy Scale: Good     Standing balance support: Bilateral upper extremity supported, During functional activity, Reliant on assistive device for balance Standing balance-Leahy Scale: Fair                             ADL either performed or assessed with clinical judgement   ADL Overall ADL's : Needs assistance/impaired     Grooming: Wash/dry hands;Wash/dry face;Supervision/safety;Standing   Upper Body Bathing: Contact guard assist;Sitting   Lower Body Bathing: Moderate assistance;Sitting/lateral leans   Upper Body Dressing : Supervision/safety;Standing   Lower Body Dressing: Total assistance;Bed level Lower Body Dressing Details (indicate cue type and reason): total A don/doff socks   Toilet Transfer Details (indicate cue type and reason): simulated via functional mobility with RW to chair and back to bed, pt declined walking to bathroom Toileting- Clothing Manipulation and Hygiene: Contact guard  assist;Supervision/safety;Sit to/from stand Toileting - Clothing Manipulation Details (indicate cue type and reason): pt standing at EOB for peri hygiene after BM, Pt unaware that he had BM on sheets until  STS from EOB to RW     Functional mobility during ADLs: Contact guard assist;Supervision/safety General ADL Comments: limited  by decreased activity tolerance, dizziness during sup-sit to EOB    Extremity/Trunk Assessment Upper Extremity Assessment Upper Extremity Assessment: Generalized weakness   Lower Extremity Assessment Lower Extremity Assessment: Defer to PT evaluation        Vision Ability to See in Adequate Light: 0 Adequate Patient Visual Report: No change from baseline     Perception     Praxis     Communication Communication Communication: Impaired Factors Affecting Communication: Hearing impaired   Cognition Arousal: Alert Behavior During Therapy: WFL for tasks assessed/performed                                 Following commands: Intact        Cueing   Cueing Techniques: Verbal cues  Exercises      Shoulder Instructions       General Comments      Pertinent Vitals/ Pain       Pain Assessment Pain Assessment: No/denies pain Pain Score: 0-No pain Pain Intervention(s): Monitored during session  Home Living                                          Prior Functioning/Environment              Frequency  Min 2X/week        Progress Toward Goals  OT Goals(current goals can now be found in the care plan section)  Progress towards OT goals: Progressing toward goals     Plan      Co-evaluation                 AM-PAC OT 6 Clicks Daily Activity     Outcome Measure   Help from another person eating meals?: None Help from another person taking care of personal grooming?: A Little Help from another person toileting, which includes using toliet, bedpan, or urinal?: A Little Help from another person bathing (including washing, rinsing, drying)?: A Lot Help from another person to put on and taking off regular upper body clothing?: A Little Help from another person to put on and taking off regular  lower body clothing?: A Lot 6 Click Score: 17    End of Session Equipment Utilized During Treatment: Gait belt;Rolling walker (2 wheels)  OT Visit Diagnosis: Muscle weakness (generalized) (M62.81)   Activity Tolerance Patient tolerated treatment well;Other (comment) (pt c/o dizziness sup-sit)   Patient Left with call bell/phone within reach;in bed;with bed alarm set   Nurse Communication Mobility status;Other (comment) (pt reports of dizziness)        Time: 8896-8871 OT Time Calculation (min): 25 min  Charges: OT General Charges $OT Visit: 1 Visit OT Treatments $Self Care/Home Management : 8-22 mins $Therapeutic Activity: 8-22 mins    Jacques Karna Loose 02/15/2024, 12:34 PM

## 2024-02-15 NOTE — Progress Notes (Signed)
 "          [name]                                                                              Raymond Hurst, is a 74 y.o. male, DOB - [birth year], FMW:[id number] Admit date - 01/16/2024    Outpatient Primary MD for the patient is [name], MD  LOS - 29  days  Chief Complaint  Patient presents with   Hematemesis       Brief summary   Patient is a 74 year old male with Left lung mass bronchogenic CA (poorly differentiated squamous cell) diagnosed 06/05/2023-bronchoscopy 08/09/2023 complicated by left PNA thorax-follows with Dr. [name], A-fib on Eliquis , previous alcohol use, UGIB 09/09/2023 cratered ulcer duodenum status post mesenteric angio/embolization at [facility].  Readmission 12/6-12/8 with upper GI bleed.  EGD clean-based ulcer told to continue twice daily PPI manage resumed Eliquis  hemoglobin was 9.6 on discharge at that time   Significant Events: 12/16 [facility] ED upper GI bleed with bright red vomiting  12/17 : Transferred to [facility].  Status post duodenal bleed Clip by IR.  Continued ongoing bleeding requiring pressors, massive transfusion protocol, emergent ex lap Dr. Rubin with duodenotomy ligation GDA branches VAC placement 12/19 Secondary closure of abdomen Dr. Ebbie  12/26 barium swallow performed and upper GI 1226 without evidence of leak.  Prolonged hospital course with intermittent bleeding.  Repeat EGD on 1/5 showed no active bleeding or high risk bleeding lesions. EGD 1/5 showed no active bleeding or high risk bleeding lesions and the bleeding was suspected to be from friable mucosa in the duodenal bulb at the site of surgical repair after DOAC started  Heparin  was stopped, underwent repeat EGD 1/11 with no active bleeding. 1/12: Hemoglobin continues to trend down, symptomatic.  GI recommended observation, no Pete colonoscopy.  Wife frustrated and requested second GI opinion, accepted graciously by GI. Cardiology consulted. 1/13/26[name]  recommended continue heparin  drip resuming heparin  drip until no further GI bleeding before resuming Eliquis    Assessment & Plan    Duodenal ulcer hemorrhage with coiling 09/2023  s/p laparotomy 12/17, secondary closure 12/19 as above -Vomiting resolved since 12/26.  Off TPN on 12/31.  JP drain removed 12/31 - Now tolerating regular diet    Recurrent GI Bleed and Symptomatic Anemia, Duodenal ulcer - Prolonged hospitalization due to intermittent GI bleeding.  Has had 5 EGDs so far, last performed on 02/11/2024. -02/03/24 and 02/11/24 CTA GIB negative for active bleeding -EGD (1/5) showed no active bleeding or high risk bleeding lesions and the bleeding was suspected to be from friable mucosa in the duodenal bulb at the site of surgical repair after DOAC started -Heparin  gtt was stopped  - Repeat EGD 02/11/24: Normal esophagus with erythematous mucosa in the gastric body with intact suture and erythematous friable mucosa but no evidence of recent bleeding for this area.  Given nonbleeding duodenal diverticulum, felt that the source of his new recurrent bleeding is from another source and possibly a diverticulum - Continue PI twice daily, sucralfate  1 g 4 times daily - Discussed with GI, hemoglobin stable 8.4, will continue IV heparin  drip today.  If remains stable and no bleeding,  will start eliquis  trial tomorrow.  Sepsis, Staph epi Bacteremia-MRSE with resistances Likely peritonitis - Bcx 12/21 postive for staph epi, blood cultures 12/26 showed no growth. - Completed course of antibiotics on 12/30 - RIJ CVC removed 02/02/24    AKI on admission, Metabolic Acidosis - Baseline Cr 0.7 - 0.9.  Likely worsened due to #1, hypotension, GI bleed. - Resolved, creatinine at baseline    Atrial Fibrillation:  - CHADVASC >4. - Cardiology consulted and had extensive discussion with patient and family.   - Plan to continue heparin  drip for another 48 hours.  If H&H remained stable, no further bleeding,  then retrial eliquis .  Can consider Watchman procedure but will need to demonstrate tolerability for [facility] for 45 days post watchman then dual antiplatelet therapy followed by single antiplatelet therapy.    Right Cephalic Vein Acute SVT and Right Brachial Vein Acute DVT:  - Noted on RUE doppler on 12/29 -Currently on IV heparin  drip    Hypernatremia:  - Resolved.   Hypokalemia:  - Replace as needed   Urinary Retention:  - Foley was initially removed and patient was voiding well.  - However, noted to be retaining again so Foley/indwelling cath placed and will be continue at discharge. Will need outpt f/u with Urology   Left Lung Mass Bronchogenic CA:  - Holding Immunotherapy/Chemotherapy. Will need Outpatient follow-up   Critical illness Thrombocytopenia: - Continue to follow    Moderate protein calorie malnutrition Nutrition Problem: Moderate Malnutrition Etiology: chronic illness (COPD, CVA, GIB, NSCLC) Signs/Symptoms: moderate fat depletion, severe muscle depletion, edema - Off TPN   Obesity class III  Estimated body mass index is 38.32 kg/m as calculated from the following:   Height as of this encounter: 6' 0.01 (1.829 m).   Weight as of this encounter: 128.2 kg.  Code Status: Full code DVT Prophylaxis:  Place TED hose Start: 02/11/24 0809 SCDs Start: 01/16/24 1731   Level of Care: Level of care: Telemetry Family Communication: Updated patient Disposition Plan:      Remains inpatient appropriate:      Procedures:    Consultants:   Cardiology General Surgery [facility] gastroenterology  Antimicrobials:   Anti-infectives (From admission, onward)    Start     Dose/Rate Route Frequency Ordered Stop   01/29/24 2200  fluconazole  (DIFLUCAN ) IVPB 400 mg  Status:  Discontinued        400 mg 100 mL/hr over 120 Minutes Intravenous Every 24 hours 01/29/24 1336 01/29/24 1346   01/29/24 2200  fluconazole  (DIFLUCAN ) IVPB 400 mg  Status:  Discontinued        400  mg 200 mL/hr over 60 Minutes Intravenous Every 24 hours 01/29/24 1346 01/30/24 1533   01/25/24 1500  vancomycin  (VANCOREADY) IVPB 1500 mg/300 mL  Status:  Discontinued        1,500 mg 150 mL/hr over 120 Minutes Intravenous Every 24 hours 01/24/24 1322 01/30/24 1233   01/24/24 1115  vancomycin  (VANCOCIN ) 2,500 mg in sodium chloride  0.9 % 500 mL IVPB        2,500 mg 262.5 mL/hr over 120 Minutes Intravenous  Once 01/24/24 1029 01/24/24 1530   01/17/24 2245  piperacillin -tazobactam (ZOSYN ) IVPB 3.375 g  Status:  Discontinued        3.375 g 12.5 mL/hr over 240 Minutes Intravenous Every 8 hours 01/17/24 2149 01/30/24 1233   01/17/24 2200  fluconazole  (DIFLUCAN ) IVPB 400 mg  Status:  Discontinued        400 mg 100 mL/hr over  120 Minutes Intravenous Every 24 hours 01/17/24 2059 01/29/24 1336          Medications  budesonide -glycopyrrolate -formoterol   2 puff Inhalation BID   carbamide peroxide  5 drop Right EAR BID   Chlorhexidine  Gluconate Cloth  6 each Topical Daily   feeding supplement (GLUCERNA SHAKE)  237 mL Oral TID BM   Gerhardt's butt cream   Topical BID   multivitamin with minerals  1 tablet Oral Daily   mouth rinse  15 mL Mouth Rinse TID   pantoprazole   40 mg Oral BID   sucralfate   1 g Oral TID WC & HS      Subjective:   Devrin Monforte was seen and examined today.  Feeling slightly dizzy this morning, no bright red blood in the stools.  No nausea or vomiting, abdominal pain.    Objective:   Vitals:   02/15/24 0952 02/15/24 0954 02/15/24 0956 02/15/24 1146  BP: 114/70 137/79 122/83   Pulse: 72 (!) 106 (!) 109   Resp: 18     Temp:      TempSrc:      SpO2: 97% 99% 99%   Weight:    128.2 kg  Height:        Intake/Output Summary (Last 24 hours) at 02/15/2024 1245 Last data filed at 02/15/2024 0947 Gross per 24 hour  Intake 549.9 ml  Output 1350 ml  Net -800.1 ml     Wt Readings from Last 3 Encounters:  02/15/24 128.2 kg  01/12/24 119.7 kg  01/06/24 118.2 kg     Physical Exam General: Alert and oriented x 3, NAD Cardiovascular: S1 S2 clear, RRR.  Respiratory: CTAB Gastrointestinal: Soft, nontender, nondistended, NBS Ext: no pedal edema bilaterally Neuro: no new deficits Psych: Normal affect      Data Reviewed:  I have personally reviewed following labs    CBC Lab Results  Component Value Date   WBC 6.2 02/15/2024   RBC 2.76 (L) 02/15/2024   HGB 8.4 (L) 02/15/2024   HCT 25.4 (L) 02/15/2024   MCV 92.0 02/15/2024   MCH 30.4 02/15/2024   PLT 159 02/15/2024   MCHC 33.1 02/15/2024   RDW 17.2 (H) 02/15/2024   LYMPHSABS 0.7 02/14/2024   MONOABS 0.5 02/14/2024   EOSABS 0.2 02/14/2024   BASOSABS 0.0 02/14/2024     Last metabolic panel Lab Results  Component Value Date   NA 138 02/14/2024   K 3.5 02/14/2024   CL 105 02/14/2024   CO2 23 02/14/2024   BUN 22 02/14/2024   CREATININE 1.19 02/14/2024   GLUCOSE 96 02/14/2024   GFRNONAA >60 02/14/2024   GFRAA >60 06/13/2019   CALCIUM  8.9 02/14/2024   PHOS 3.2 02/14/2024   PROT 5.7 (L) 02/14/2024   ALBUMIN 3.3 (L) 02/14/2024   LABGLOB 3.2 06/01/2023   AGRATIO 1.3 07/21/2020   BILITOT 0.4 02/14/2024   ALKPHOS 64 02/14/2024   AST 17 02/14/2024   ALT 16 02/14/2024   ANIONGAP 11 02/14/2024    CBG (last 3)  No results for input(s): GLUCAP in the last 72 hours.    Coagulation Profile: No results for input(s): INR, PROTIME in the last 168 hours.   Radiology Studies: I have personally reviewed the imaging studies  No results found.     Nydia Distance M.D. [name] 02/15/2024, 12:45 PM  Available via Epic secure chat 7am-7pm After 7 pm, please refer to night coverage provider listed on amion.    "

## 2024-02-15 NOTE — Progress Notes (Signed)
 PHARMACY - ANTICOAGULATION CONSULT NOTE  Pharmacy Consult for heparin   Indication: atrial fibrillation and chronic RUE DVT  Allergies[1]  Patient Measurements: Height: 6' 0.01 (182.9 cm) Weight: 128.2 kg (282 lb 10.1 oz) (Bed weight) IBW/kg (Calculated) : 77.62 HEPARIN  DW (KG): 103.8  Vital Signs: Temp: 97.6 F (36.4 C) (01/15 1935) BP: 122/80 (01/15 1935) Pulse Rate: 89 (01/15 1935)  Labs: Recent Labs    02/13/24 0357 02/13/24 1127 02/13/24 1644 02/13/24 2029 02/14/24 0328 02/15/24 0742 02/15/24 1938  HGB 8.2*   < > 8.6*  --  8.0* 8.4*  --   HCT 24.2*   < > 25.9*  --  24.1* 25.4*  --   PLT 144*  --   --   --  137* 159  --   HEPARINUNFRC <0.10*  --   --    < > 0.57 0.69 0.43  CREATININE 1.35*  --   --   --  1.19  --   --    < > = values in this interval not displayed.    Estimated Creatinine Clearance: 77.6 mL/min (by C-G formula based on SCr of 1.19 mg/dL).   Assessment: 71 YOM w/ Hx AF with a RUE DVT in need of AC. Apixaban  PTA. Last dose was >7 days ago. He was admitted to [facility] w/ duodenal ulcer hemorrhage which was clipped on 12/17.   Resuming heparin , with plans to monitor Hgb for 3-5 days while on heparin  before resuming apixaban .  Heparin  level above goal despite rate decrease to 1900 units/hr yesterday.  Will further reduce rate to target low end of goal given bleed risk.  PM update: heparin  level 0.43 is therapeutic on 1700 units/hr. No issues with the infusion or bleeding reported.  Goal of Therapy:  Heparin  level 0.3-0.5 units/mL Monitor platelets by anticoagulation protocol: Yes   Plan:  Continue heparin  infusion at 1700 units/hr Check confirmatory heparin  level with AM labs Check heparin  level daily while on heparin  Continue to monitor H&H and platelets Noted plans to restart Apixaban  1/16 if Hgb remains stable.  Rocky Slade, PharmD, BCPS Clinical Pharmacist  **Pharmacist phone directory can be found on [email or web address] listed under [facility].  02/15/2024 8:33 PM        [1] No Known Allergies

## 2024-02-15 NOTE — Progress Notes (Addendum)
 Nutrition Follow-up  DOCUMENTATION CODES:   Non-severe (moderate) malnutrition in context of chronic illness  INTERVENTION:   Encourage po intake on regular diet Change to Glucerna shakes per pt's request: Glucerna Shake po TID, each supplement provides 220 kcal and 10 grams of protein Encourage a protein at every meal Discontinue Mighty shakes and Magic cups per pt's request  NUTRITION DIAGNOSIS:   Moderate Malnutrition related to chronic illness (COPD, CVA, GIB, NSCLC) as evidenced by moderate fat depletion, severe muscle depletion, edema. - Ongoing   GOAL:   Patient will meet greater than or equal to 90% of their needs - Progressing   MONITOR:   Vent status, Labs, Weight trends, I & O's, Skin  REASON FOR ASSESSMENT:   Ventilator    ASSESSMENT:   Pt admitted with hematemesis r/t duodenal hemorrhage. PMH significant for COPD, afib on Eliquis , CVA, history of GI bleed d/t gastric ulcer, chronic lymphedema, non small cell lung cancer on monthly chemo. Recently admitted 12/6-12/8 with bloody stools r/t clean-based gastric ulcer without signs of active bleeding.   Pt doing well on visit, still with ongoing GIB, had red BM last night. Hemoglobin continues to decline now on heparin  drip before resuming Eliquis .   Pt reports good appetite and po intake. Had 100% of his breakfast this morning. His wife usually brings him lunch from takeout somewhere. Pt does not like the inpatient food so pt states dinner is hit or miss.Only had bites last night. Encouraged family to bring in meals for pt to increase po intake. Pt does not likes Ensures, Mighty shakes, or Magic cups. Pt states he will drink Glucerna. Will change from Ensure to Glucerna but encouraged pt to have a protein with every meal as Glucerna is only 10 gm of protein compared to 20 gm in Ensure.   New exam performed with similar findings to last exam. Still with fluid in lower extremities and hands. New bed weight of 282 lbs  today. Pt does report he went from 370 lbs to now 282 lbs, 24% weight loss in the last year. Likely some of this is fluid loss d/t pt's chronic lymphedema however still very concerning. Pt reports he lost weight because he had no appetite and was only eating 1 meal per day because he was so busy.   Pt excited for next step of going to rehab when he is medically ready. Encouraged increased po intake emphasizing protein for intensity of rehab. Has been walking hallways with PT however has been feeling dizzy recently working with them.   Admit weight: 119.7 kg Current weight: 128.2 kg   Average Meal Intake: 1/8-1/10: 75% intake x 7 recorded meals  Nutritionally Relevant Medications: Scheduled Meds:  feeding supplement (GLUCERNA SHAKE)  237 mL Oral TID BM   multivitamin with minerals  1 tablet Oral Daily   Continuous Infusions:  heparin  1,900 Units/hr (02/15/24 0947)   Labs Reviewed: CBG ranges from 94-96 mg/dL over the last 24 hours HgbA1c 5.5   NUTRITION - FOCUSED PHYSICAL EXAM:  Flowsheet Row Most Recent Value  Orbital Region Moderate depletion  Upper Arm Region Severe depletion  Thoracic and Lumbar Region Unable to assess  [Edema]  Buccal Region Moderate depletion  Temple Region Moderate depletion  Clavicle Bone Region Severe depletion  Clavicle and Acromion Bone Region Severe depletion  Scapular Bone Region Severe depletion  Dorsal Hand Unable to assess  [Edema]  Patellar Region Unable to assess  [lymphedema]  Anterior Thigh Region Unable to assess  [lymphedema]  Posterior Calf Region Unable to assess  [lymphedema]  Edema (RD Assessment) Severe  [lymphedema lwoer extremities, edema in hands]  Hair Reviewed  Eyes Reviewed  Mouth Reviewed  Skin Reviewed  Nails Reviewed    Diet Order:   Diet Order             Diet regular Fluid consistency: Thin  Diet effective now                   EDUCATION NEEDS:   No education needs have been identified at this  time  Skin:  Skin Assessment: Reviewed RN Assessment Skin Integrity Issues:: Wound VAC Wound Vac: removed  Last BM:  1/13 - type 4, brown and red  Height:   Ht Readings from Last 1 Encounters:  01/21/24 6' 0.01 (1.829 m)    Weight:   Wt Readings from Last 1 Encounters:  02/15/24 128.2 kg    Ideal Body Weight:  80.9 kg  BMI:  Body mass index is 38.32 kg/m.  Estimated Nutritional Needs:   Kcal:  2100-2300  Protein:  115-130g  Fluid:  >/=2L   Olivia Kenning, RD Registered Dietitian  See Amion for more information

## 2024-02-16 DIAGNOSIS — K922 Gastrointestinal hemorrhage, unspecified: Secondary | ICD-10-CM | POA: Diagnosis not present

## 2024-02-16 DIAGNOSIS — G4733 Obstructive sleep apnea (adult) (pediatric): Secondary | ICD-10-CM | POA: Diagnosis not present

## 2024-02-16 DIAGNOSIS — I482 Chronic atrial fibrillation, unspecified: Secondary | ICD-10-CM | POA: Diagnosis not present

## 2024-02-16 LAB — CBC
HCT: 23.8 % — ABNORMAL LOW (ref 39.0–52.0)
Hemoglobin: 7.9 g/dL — ABNORMAL LOW (ref 13.0–17.0)
MCH: 31.1 pg (ref 26.0–34.0)
MCHC: 33.2 g/dL (ref 30.0–36.0)
MCV: 93.7 fL (ref 80.0–100.0)
Platelets: 154 K/uL (ref 150–400)
RBC: 2.54 MIL/uL — ABNORMAL LOW (ref 4.22–5.81)
RDW: 17.8 % — ABNORMAL HIGH (ref 11.5–15.5)
WBC: 5.6 K/uL (ref 4.0–10.5)
nRBC: 0 % (ref 0.0–0.2)

## 2024-02-16 LAB — FERRITIN: Ferritin: 518 ng/mL — ABNORMAL HIGH (ref 24–336)

## 2024-02-16 LAB — FOLATE: Folate: >20 ng/mL

## 2024-02-16 LAB — IRON AND TIBC
Iron: 57 ug/dL (ref 45–182)
Saturation Ratios: 22 % (ref 17.9–39.5)
TIBC: 255 ug/dL (ref 250–450)
UIBC: 198 ug/dL

## 2024-02-16 LAB — HEPARIN LEVEL (UNFRACTIONATED): Heparin Unfractionated: 0.37 [IU]/mL (ref 0.30–0.70)

## 2024-02-16 LAB — RETICULOCYTES
Immature Retic Fract: 25 % — ABNORMAL HIGH (ref 2.3–15.9)
RBC.: 2.56 MIL/uL — ABNORMAL LOW (ref 4.22–5.81)
Retic Count, Absolute: 118.8 K/uL (ref 19.0–186.0)
Retic Ct Pct: 4.6 % — ABNORMAL HIGH (ref 0.4–3.1)

## 2024-02-16 LAB — VITAMIN B12: Vitamin B-12: 904 pg/mL (ref 180–914)

## 2024-02-16 NOTE — Plan of Care (Signed)
 " Problem: Health Behavior/Discharge Planning: Goal: Ability to manage health-related needs will improve Outcome: Progressing   Problem: Clinical Measurements: Goal: Ability to maintain clinical measurements within normal limits will improve Outcome: Progressing Goal: Will remain free from infection Outcome: Progressing Goal: Diagnostic test results will improve Outcome: Progressing Goal: Respiratory complications will improve Outcome: Progressing Goal: Cardiovascular complication will be avoided Outcome: Progressing   Problem: Activity: Goal: Risk for activity intolerance will decrease Outcome: Progressing   Problem: Nutrition: Goal: Adequate nutrition will be maintained Outcome: Progressing   Problem: Coping: Goal: Level of anxiety will decrease Outcome: Progressing   Problem: Elimination: Goal: Will not experience complications related to bowel motility Outcome: Progressing Goal: Will not experience complications related to urinary retention Outcome: Progressing   Problem: Pain Managment: Goal: General experience of comfort will improve and/or be controlled Outcome: Progressing   Problem: Safety: Goal: Ability to remain free from injury will improve Outcome: Progressing   Problem: Skin Integrity: Goal: Risk for impaired skin integrity will decrease Outcome: Progressing   Problem: Education: Goal: Ability to describe self-care measures that may prevent or decrease complications (Diabetes Survival Skills Education) will improve Outcome: Progressing Goal: Individualized Educational Video(s) Outcome: Progressing   Problem: Coping: Goal: Ability to adjust to condition or change in health will improve Outcome: Progressing   Problem: Fluid Volume: Goal: Ability to maintain a balanced intake and output will improve Outcome: Progressing   Problem: Health Behavior/Discharge Planning: Goal: Ability to identify and utilize available resources and services will  improve Outcome: Progressing Goal: Ability to manage health-related needs will improve Outcome: Progressing   Problem: Metabolic: Goal: Ability to maintain appropriate glucose levels will improve Outcome: Progressing   Problem: Nutritional: Goal: Maintenance of adequate nutrition will improve Outcome: Progressing Goal: Progress toward achieving an optimal weight will improve Outcome: Progressing   Problem: Skin Integrity: Goal: Risk for impaired skin integrity will decrease Outcome: Progressing   Problem: Tissue Perfusion: Goal: Adequacy of tissue perfusion will improve Outcome: Progressing   Problem: Education: Goal: Knowledge of the prescribed therapeutic regimen will improve Outcome: Progressing   Problem: Bowel/Gastric: Goal: Gastrointestinal status for postoperative course will improve Outcome: Progressing   Problem: Cardiac: Goal: Ability to maintain an adequate cardiac output Outcome: Progressing Goal: Will show no evidence of cardiac arrhythmias Outcome: Progressing   Problem: Nutritional: Goal: Will attain and maintain optimal nutritional status Outcome: Progressing   Problem: Education: Goal: Knowledge of General Education information will improve Description: Including pain rating scale, medication(s)/side effects and non-pharmacologic comfort measures Outcome: Progressing   Problem: Health Behavior/Discharge Planning: Goal: Ability to manage health-related needs will improve Outcome: Progressing   Problem: Clinical Measurements: Goal: Ability to maintain clinical measurements within normal limits will improve Outcome: Progressing Goal: Will remain free from infection Outcome: Progressing Goal: Diagnostic test results will improve Outcome: Progressing Goal: Respiratory complications will improve Outcome: Progressing Goal: Cardiovascular complication will be avoided Outcome: Progressing   Problem: Activity: Goal: Risk for activity intolerance  will decrease Outcome: Progressing   Problem: Nutrition: Goal: Adequate nutrition will be maintained Outcome: Progressing   Problem: Coping: Goal: Level of anxiety will decrease Outcome: Progressing   Problem: Elimination: Goal: Will not experience complications related to bowel motility Outcome: Progressing Goal: Will not experience complications related to urinary retention Outcome: Progressing   Problem: Pain Managment: Goal: General experience of comfort will improve and/or be controlled Outcome: Progressing   Problem: Safety: Goal: Ability to remain free from injury will improve Outcome: Progressing   Problem: Skin Integrity: Goal: Risk for  impaired skin integrity will decrease Outcome: Progressing   "

## 2024-02-16 NOTE — Progress Notes (Signed)
 PHARMACY - ANTICOAGULATION CONSULT NOTE  Pharmacy Consult for heparin   Indication: atrial fibrillation and chronic RUE DVT  Allergies[1]  Patient Measurements: Height: 6' 0.01 (182.9 cm) Weight: 128.2 kg (282 lb 10.1 oz) (Bed weight) IBW/kg (Calculated) : 77.62 HEPARIN  DW (KG): 103.8  Vital Signs:    Labs: Recent Labs    02/14/24 0328 02/15/24 0742 02/15/24 1938 02/16/24 0616  HGB 8.0* 8.4*  --  7.9*  HCT 24.1* 25.4*  --  23.8*  PLT 137* 159  --  154  HEPARINUNFRC 0.57 0.69 0.43 0.37  CREATININE 1.19  --   --   --     Estimated Creatinine Clearance: 77.6 mL/min (by C-G formula based on SCr of 1.19 mg/dL).   Assessment: 48 YOM w/ Hx AF with a RUE DVT in need of AC. Apixaban  PTA. Last dose was >7 days ago. He [facility] admitted [facility] w/ duodenal ulcer hemorrhage which was clipped on 12/17.   Resuming heparin , with plans to monitor Hgb for 3-5 days while on heparin  before resuming apixaban .  Heparin  level therapeutic on 1700 units/hr.  Hgb down slightly 8.4 > 7.9.  Will monitor.    Goal of Therapy:  Heparin  level 0.3-0.5 units/mL Monitor platelets by anticoagulation protocol: Yes   Plan:  Continue heparin  infusion at 1700 units/hr Check heparin  level daily while on heparin  Continue to monitor H&H and platelets Follow-up plans to restart Apixaban  if Hgb remains stable.   [employer], Pharm.D., BCPS Clinical Pharmacist Clinical phone for 02/16/2024 from 7:30-3:00 is [phone].  **Pharmacist phone directory can be found on [email or web address] listed under [facility].  02/16/2024 8:44 AM       [1] No Known Allergies

## 2024-02-16 NOTE — Progress Notes (Addendum)
 Mobility Specialist: Progress Note   02/16/24 1200  Mobility  Activity Ambulated with assistance  Level of Assistance Standby assist, set-up cues, supervision of patient - no hands on  Assistive Device Front wheel walker  Distance Ambulated (ft) 275 ft  Activity Response Tolerated well  Mobility Referral Yes  Mobility visit 1 Mobility  Mobility Specialist Start Time (ACUTE ONLY) 1010  Mobility Specialist Stop Time (ACUTE ONLY) 1021  Mobility Specialist Time Calculation (min) (ACUTE ONLY) 11 min    Pt received in bed, agreeable to mobility session. SV throughout. Feeling much better today. No c/o dizziness. Took 1x standing rest and then returned to room. SpO2 98% RA, HR 115-117. Requested to use the BR. Left on the commode with elevated seat with all needs met, instructed to pull call bell when finished. LPN aware.   Ileana Lute Mobility Specialist Please contact via SecureChat or Rehab office at [phone]

## 2024-02-16 NOTE — Progress Notes (Signed)
 "          [name]                                                                              Raymond Hurst, is a 74 y.o. male, DOB - [birth year], FMW:[id number] Admit date - 01/16/2024    Outpatient Primary MD for the patient is [name], MD  LOS - 30  days  Chief Complaint  Patient presents with   Hematemesis       Brief summary   Patient is a 74 year old male with Left lung mass bronchogenic CA (poorly differentiated squamous cell) diagnosed 06/05/2023-bronchoscopy 08/09/2023 complicated by left PNA thorax-follows with Dr. [name], A-fib on Eliquis , previous alcohol use, UGIB 09/09/2023 cratered ulcer duodenum status post mesenteric angio/embolization at [facility].  Readmission 12/6-12/8 with upper GI bleed.  EGD clean-based ulcer told to continue twice daily PPI manage resumed Eliquis  hemoglobin was 9.6 on discharge at that time   Significant Events: 12/16 [facility] ED upper GI bleed with bright red vomiting  12/17 : Transferred to [facility].  Status post duodenal bleed Clip by IR.  Continued ongoing bleeding requiring pressors, massive transfusion protocol, emergent ex lap Dr. Rubin with duodenotomy ligation GDA branches VAC placement 12/19 Secondary closure of abdomen Dr. Ebbie  12/26 barium swallow performed and upper GI 1226 without evidence of leak.  Prolonged hospital course with intermittent bleeding.  Repeat EGD on 1/5 showed no active bleeding or high risk bleeding lesions. EGD 1/5 showed no active bleeding or high risk bleeding lesions and the bleeding was suspected to be from friable mucosa in the duodenal bulb at the site of surgical repair after DOAC started  Heparin  was stopped, underwent repeat EGD 1/11 with no active bleeding. 1/12: Hemoglobin continues to trend down, symptomatic.  GI recommended observation, no Pete colonoscopy.  Wife frustrated and requested second GI opinion, accepted graciously by GI. Cardiology consulted. 1/13/26[name]  recommended continue heparin  drip resuming heparin  drip until no further GI bleeding before resuming Eliquis    Assessment & Plan    Duodenal ulcer hemorrhage with coiling 09/2023  s/p laparotomy 12/17, secondary closure 12/19 as above -Vomiting resolved since 12/26.  Off TPN on 12/31.  JP drain removed 12/31 - Now tolerating regular diet    Recurrent GI Bleed and Symptomatic Anemia, Duodenal ulcer - Prolonged hospitalization due to intermittent GI bleeding.  Has had 5 EGDs so far, last performed on 02/11/2024. -02/03/24 and 02/11/24 CTA GIB negative for active bleeding -EGD (1/5) showed no active bleeding or high risk bleeding lesions and the bleeding was suspected to be from friable mucosa in the duodenal bulb at the site of surgical repair after DOAC started -Heparin  gtt was stopped  - Repeat EGD 02/11/24: Normal esophagus with erythematous mucosa in the gastric body with intact suture and erythematous friable mucosa but no evidence of recent bleeding for this area.  Given nonbleeding duodenal diverticulum, felt that the source of his new recurrent bleeding is from another source and possibly a diverticulum - Continue PI twice daily, sucralfate  1 g 4 times daily - Hemoglobin 7.9 today, hemoglobin 8.4 yesterday on 1/15.  Had a BM today but no bleeding and it -  Anemia profile showed iron  57, ferritin 518, folate> 20, B12 904 - Discussed with GI, will continue IV heparin  today, recheck hemoglobin in a.m.  Sepsis, Staph epi Bacteremia-MRSE with resistances Likely peritonitis - Bcx 12/21 postive for staph epi, blood cultures 12/26 showed no growth. - Completed course of antibiotics on 12/30 - RIJ CVC removed 02/02/24    AKI on admission, Metabolic Acidosis - Baseline Cr 0.7 - 0.9.  Likely worsened due to #1, hypotension, GI bleed. - Resolved, creatinine at baseline    Atrial Fibrillation:  - CHADVASC >4. - Cardiology consulted and had extensive discussion with patient and family.   - Plan  to continue heparin  drip for another 48 hours.  If H&H remained stable, no further bleeding, then retrial eliquis .  Can consider Watchman procedure but will need to demonstrate tolerability for [facility] for 45 days post watchman then dual antiplatelet therapy followed by single antiplatelet therapy.    Right Cephalic Vein Acute SVT and Right Brachial Vein Acute DVT:  - Noted on RUE doppler on 12/29 -Currently on IV heparin  drip    Hypernatremia:  - Resolved.   Hypokalemia:  - Replace as needed   Urinary Retention:  - Foley was initially removed and patient was voiding well.  - However, noted to be retaining again so Foley/indwelling cath placed and will be continue at discharge. Will need outpt f/u with Urology   Left Lung Mass Bronchogenic CA:  - Holding Immunotherapy/Chemotherapy. Will need Outpatient follow-up   Critical illness Thrombocytopenia: - Continue to follow    Moderate protein calorie malnutrition Nutrition Problem: Moderate Malnutrition Etiology: chronic illness (COPD, CVA, GIB, NSCLC) Signs/Symptoms: moderate fat depletion, severe muscle depletion, edema - Off TPN   Obesity class III  Estimated body mass index is 38.32 kg/m as calculated from the following:   Height as of this encounter: 6' 0.01 (1.829 m).   Weight as of this encounter: 128.2 kg.  Code Status: Full code DVT Prophylaxis:  Place TED hose Start: 02/15/24 1250 Place TED hose Start: 02/11/24 0809 SCDs Start: 01/16/24 1731   Level of Care: Level of care: Telemetry Family Communication: Updated patient Disposition Plan:      Remains inpatient appropriate:      Procedures:    Consultants:   Cardiology General Surgery [facility] gastroenterology  Antimicrobials:   Anti-infectives (From admission, onward)    Start     Dose/Rate Route Frequency Ordered Stop   01/29/24 2200  fluconazole  (DIFLUCAN ) IVPB 400 mg  Status:  Discontinued        400 mg 100 mL/hr over 120 Minutes Intravenous Every  24 hours 01/29/24 1336 01/29/24 1346   01/29/24 2200  fluconazole  (DIFLUCAN ) IVPB 400 mg  Status:  Discontinued        400 mg 200 mL/hr over 60 Minutes Intravenous Every 24 hours 01/29/24 1346 01/30/24 1533   01/25/24 1500  vancomycin  (VANCOREADY) IVPB 1500 mg/300 mL  Status:  Discontinued        1,500 mg 150 mL/hr over 120 Minutes Intravenous Every 24 hours 01/24/24 1322 01/30/24 1233   01/24/24 1115  vancomycin  (VANCOCIN ) 2,500 mg in sodium chloride  0.9 % 500 mL IVPB        2,500 mg 262.5 mL/hr over 120 Minutes Intravenous  Once 01/24/24 1029 01/24/24 1530   01/17/24 2245  piperacillin -tazobactam (ZOSYN ) IVPB 3.375 g  Status:  Discontinued        3.375 g 12.5 mL/hr over 240 Minutes Intravenous Every 8 hours 01/17/24 2149 01/30/24 1233  01/17/24 2200  fluconazole  (DIFLUCAN ) IVPB 400 mg  Status:  Discontinued        400 mg 100 mL/hr over 120 Minutes Intravenous Every 24 hours 01/17/24 2059 01/29/24 1336          Medications  budesonide -glycopyrrolate -formoterol   2 puff Inhalation BID   carbamide peroxide  5 drop Right EAR BID   Chlorhexidine  Gluconate Cloth  6 each Topical Daily   feeding supplement (GLUCERNA SHAKE)  237 mL Oral TID BM   Gerhardt's butt cream   Topical BID   multivitamin with minerals  1 tablet Oral Daily   mouth rinse  15 mL Mouth Rinse TID   pantoprazole   40 mg Oral BID   sucralfate   1 g Oral TID WC & HS      Subjective:   Raymond Hurst was seen and examined today.  No acute complaints, had a BM today, no bleeding.  Hemoglobin 7.9.  No epistaxis, hematochezia or melena or any other bleeding issues.  Objective:   Vitals:   02/15/24 1935 02/15/24 2035 02/16/24 0850 02/16/24 0929  BP: 122/80  108/80   Pulse: 89 64 82   Resp: 18 16 18    Temp: 97.6 F (36.4 C)  98.5 F (36.9 C)   TempSrc:      SpO2: 97% 99% 95% 98%  Weight:      Height:        Intake/Output Summary (Last 24 hours) at 02/16/2024 1233 Last data filed at 02/16/2024 0553 Gross per 24  hour  Intake 808.73 ml  Output 3700 ml  Net -2891.27 ml     Wt Readings from Last 3 Encounters:  02/15/24 128.2 kg  01/12/24 119.7 kg  01/06/24 118.2 kg   Physical Exam General: Alert and oriented x 3, NAD Cardiovascular: S1 S2 clear, RRR.  Respiratory: CTAB, no wheezing Gastrointestinal: Soft, nontender, nondistended, NBS Ext: no pedal edema bilaterally Neuro: no new deficits Psych: Normal affect        Data Reviewed:  I have personally reviewed following labs    CBC Lab Results  Component Value Date   WBC 5.6 02/16/2024   RBC 2.56 (L) 02/16/2024   HGB 7.9 (L) 02/16/2024   HCT 23.8 (L) 02/16/2024   MCV 93.7 02/16/2024   MCH 31.1 02/16/2024   PLT 154 02/16/2024   MCHC 33.2 02/16/2024   RDW 17.8 (H) 02/16/2024   LYMPHSABS 0.7 02/14/2024   MONOABS 0.5 02/14/2024   EOSABS 0.2 02/14/2024   BASOSABS 0.0 02/14/2024     Last metabolic panel Lab Results  Component Value Date   NA 138 02/14/2024   K 3.5 02/14/2024   CL 105 02/14/2024   CO2 23 02/14/2024   BUN 22 02/14/2024   CREATININE 1.19 02/14/2024   GLUCOSE 96 02/14/2024   GFRNONAA >60 02/14/2024   GFRAA >60 06/13/2019   CALCIUM  8.9 02/14/2024   PHOS 3.2 02/14/2024   PROT 5.7 (L) 02/14/2024   ALBUMIN 3.3 (L) 02/14/2024   LABGLOB 3.2 06/01/2023   AGRATIO 1.3 07/21/2020   BILITOT 0.4 02/14/2024   ALKPHOS 64 02/14/2024   AST 17 02/14/2024   ALT 16 02/14/2024   ANIONGAP 11 02/14/2024    CBG (last 3)  No results for input(s): GLUCAP in the last 72 hours.    Coagulation Profile: No results for input(s): INR, PROTIME in the last 168 hours.   Radiology Studies: I have personally reviewed the imaging studies  No results found.     Raymond Hurst M.D. [name]  02/16/2024, 12:33 PM  Available via Epic secure chat 7am-7pm After 7 pm, please refer to night coverage provider listed on amion.    "

## 2024-02-16 NOTE — Progress Notes (Signed)
 Subjective: Started a bowel movement since 02/14/2024. States he felt dizzy yesterday when trying to participate in physical therapy.  Objective: Vital signs in last 24 hours: Temp:  [97.6 F (36.4 C)-98.5 F (36.9 C)] 98.5 F (36.9 C) (01/16 0850) Pulse Rate:  [64-109] 82 (01/16 0850) Resp:  [16-18] 18 (01/16 0850) BP: (108-137)/(63-83) 108/80 (01/16 0850) SpO2:  [95 %-99 %] 95 % (01/16 0850) Weight:  [128.2 kg] 128.2 kg (01/15 1146) Weight change:  Last BM Date : 02/14/24  PE: Prominent pallor GENERAL: Not in distress  ABDOMEN: Distended, nontender, normoactive bowel sounds EXTREMITIES: no deformity  Lab Results: Results for orders placed or performed during the hospital encounter of 01/16/24 (from the past 48 hours)  CBC     Status: Abnormal   Collection Time: 02/15/24  7:42 AM  Result Value Ref Range   WBC 6.2 4.0 - 10.5 K/uL   RBC 2.76 (L) 4.22 - 5.81 MIL/uL   Hemoglobin 8.4 (L) 13.0 - 17.0 g/dL   HCT 74.5 (L) 60.9 - 47.9 %   MCV 92.0 80.0 - 100.0 fL   MCH 30.4 26.0 - 34.0 pg   MCHC 33.1 30.0 - 36.0 g/dL   RDW 82.7 (H) 88.4 - 84.4 %   Platelets 159 150 - 400 K/uL   nRBC 0.0 0.0 - 0.2 %    Comment: Performed at [facility], [address] [place], [place] [address]  Heparin  level (unfractionated)     Status: None   Collection Time: 02/15/24  7:42 AM  Result Value Ref Range   Heparin  Unfractionated 0.69 0.30 - 0.70 IU/mL    Comment: (NOTE) The clinical reportable range upper limit is being lowered to >1.10 to align with the FDA approved guidance for the current laboratory assay.  If heparin  results are below expected values, and patient dosage has  been confirmed, suggest follow up testing of antithrombin III levels. Performed at [facility], [address], [place], [place] [address]   Heparin  level (unfractionated)     Status: None   Collection Time: 02/15/24  7:38 PM  Result Value Ref Range   Heparin  Unfractionated 0.43 0.30 - 0.70 IU/mL     Comment: (NOTE) The clinical reportable range upper limit is being lowered to >1.10 to align with the FDA approved guidance for the current laboratory assay.  If heparin  results are below expected values, and patient dosage has  been confirmed, suggest follow up testing of antithrombin III levels. Performed at [facility], [address], [place], [place] [address]   CBC     Status: Abnormal   Collection Time: 02/16/24  6:16 AM  Result Value Ref Range   WBC 5.6 4.0 - 10.5 K/uL   RBC 2.54 (L) 4.22 - 5.81 MIL/uL   Hemoglobin 7.9 (L) 13.0 - 17.0 g/dL   HCT 76.1 (L) 60.9 - 47.9 %   MCV 93.7 80.0 - 100.0 fL   MCH 31.1 26.0 - 34.0 pg   MCHC 33.2 30.0 - 36.0 g/dL   RDW 82.1 (H) 88.4 - 84.4 %   Platelets 154 150 - 400 K/uL   nRBC 0.0 0.0 - 0.2 %    Comment: Performed at [facility], [address], [place], [place] [address]  Heparin  level (unfractionated)     Status: None   Collection Time: 02/16/24  6:16 AM  Result Value Ref Range   Heparin  Unfractionated 0.37 0.30 - 0.70 IU/mL    Comment: (NOTE) The  clinical reportable range upper limit is being lowered to >1.10 to align with the FDA approved guidance for the current laboratory assay.  If heparin  results are below expected values, and patient dosage has  been confirmed, suggest follow up testing of antithrombin III levels. Performed at [facility], [address] [place], [place] [address]     Studies/Results: No results found.  Medications: I have reviewed the patient's current medications.  Assessment: History of duodenal ulcer requiring 5 EGDs, IR embolization, subsequent laparotomy and duodenotomy  Hb 7.9 today from 8.4, Hb was 8 No bowel movement in 48 hours Hemodynamic stable: BP 108/ 80, heart rate 84  Plan: Will continue heparin  today, possibly start Eliquis  tomorrow in a.m. if hemoglobin remains stable. Current management with PPI twice daily and sucralfate  4 times a day.  Estelita Manas,  MD 02/16/2024, 8:58 AM

## 2024-02-17 DIAGNOSIS — K922 Gastrointestinal hemorrhage, unspecified: Secondary | ICD-10-CM | POA: Diagnosis not present

## 2024-02-17 DIAGNOSIS — I482 Chronic atrial fibrillation, unspecified: Secondary | ICD-10-CM | POA: Diagnosis not present

## 2024-02-17 DIAGNOSIS — D62 Acute posthemorrhagic anemia: Secondary | ICD-10-CM | POA: Diagnosis not present

## 2024-02-17 DIAGNOSIS — G4733 Obstructive sleep apnea (adult) (pediatric): Secondary | ICD-10-CM | POA: Diagnosis not present

## 2024-02-17 LAB — CBC
HCT: 24 % — ABNORMAL LOW (ref 39.0–52.0)
Hemoglobin: 8.1 g/dL — ABNORMAL LOW (ref 13.0–17.0)
MCH: 31.2 pg (ref 26.0–34.0)
MCHC: 33.8 g/dL (ref 30.0–36.0)
MCV: 92.3 fL (ref 80.0–100.0)
Platelets: 152 K/uL (ref 150–400)
RBC: 2.6 MIL/uL — ABNORMAL LOW (ref 4.22–5.81)
RDW: 18.6 % — ABNORMAL HIGH (ref 11.5–15.5)
WBC: 6.3 K/uL (ref 4.0–10.5)
nRBC: 0 % (ref 0.0–0.2)

## 2024-02-17 LAB — HEPARIN LEVEL (UNFRACTIONATED): Heparin Unfractionated: 0.42 [IU]/mL (ref 0.30–0.70)

## 2024-02-17 MED ORDER — ORAL CARE MOUTH RINSE: 15.0000 mL | OROMUCOSAL | Status: DC | PRN

## 2024-02-17 NOTE — Plan of Care (Signed)
" °  Problem: Health Behavior/Discharge Planning: Goal: Ability to manage health-related needs will improve Outcome: Progressing   Problem: Clinical Measurements: Goal: Ability to maintain clinical measurements within normal limits will improve Outcome: Progressing Goal: Will remain free from infection Outcome: Progressing Goal: Diagnostic test results will improve Outcome: Progressing Goal: Respiratory complications will improve Outcome: Progressing Goal: Cardiovascular complication will be avoided Outcome: Progressing   Problem: Activity: Goal: Risk for activity intolerance will decrease Outcome: Progressing   Problem: Nutrition: Goal: Adequate nutrition will be maintained Outcome: Progressing   Problem: Coping: Goal: Level of anxiety will decrease Outcome: Progressing   Problem: Elimination: Goal: Will not experience complications related to bowel motility Outcome: Progressing Goal: Will not experience complications related to urinary retention Outcome: Progressing   Problem: Pain Managment: Goal: General experience of comfort will improve and/or be controlled Outcome: Progressing   Problem: Safety: Goal: Ability to remain free from injury will improve Outcome: Progressing   Problem: Skin Integrity: Goal: Risk for impaired skin integrity will decrease Outcome: Progressing   Problem: Education: Goal: Ability to describe self-care measures that may prevent or decrease complications (Diabetes Survival Skills Education) will improve Outcome: Progressing Goal: Individualized Educational Video(s) Outcome: Progressing   Problem: Coping: Goal: Ability to adjust to condition or change in health will improve Outcome: Progressing   Problem: Fluid Volume: Goal: Ability to maintain a balanced intake and output will improve Outcome: Progressing   Problem: Health Behavior/Discharge Planning: Goal: Ability to identify and utilize available resources and services will  improve Outcome: Progressing Goal: Ability to manage health-related needs will improve Outcome: Progressing   Problem: Metabolic: Goal: Ability to maintain appropriate glucose levels will improve Outcome: Progressing   Problem: Nutritional: Goal: Maintenance of adequate nutrition will improve Outcome: Progressing Goal: Progress toward achieving an optimal weight will improve Outcome: Progressing   Problem: Skin Integrity: Goal: Risk for impaired skin integrity will decrease Outcome: Progressing   Problem: Tissue Perfusion: Goal: Adequacy of tissue perfusion will improve Outcome: Progressing   Problem: Education: Goal: Knowledge of General Education information will improve Description: Including pain rating scale, medication(s)/side effects and non-pharmacologic comfort measures Outcome: Progressing   Problem: Health Behavior/Discharge Planning: Goal: Ability to manage health-related needs will improve Outcome: Progressing   Problem: Clinical Measurements: Goal: Ability to maintain clinical measurements within normal limits will improve Outcome: Progressing Goal: Will remain free from infection Outcome: Progressing Goal: Diagnostic test results will improve Outcome: Progressing Goal: Respiratory complications will improve Outcome: Progressing Goal: Cardiovascular complication will be avoided Outcome: Progressing   Problem: Activity: Goal: Risk for activity intolerance will decrease Outcome: Progressing   Problem: Nutrition: Goal: Adequate nutrition will be maintained Outcome: Progressing   Problem: Coping: Goal: Level of anxiety will decrease Outcome: Progressing   Problem: Elimination: Goal: Will not experience complications related to bowel motility Outcome: Progressing Goal: Will not experience complications related to urinary retention Outcome: Progressing   Problem: Pain Managment: Goal: General experience of comfort will improve and/or be  controlled Outcome: Progressing   Problem: Safety: Goal: Ability to remain free from injury will improve Outcome: Progressing   Problem: Skin Integrity: Goal: Risk for impaired skin integrity will decrease Outcome: Progressing   "

## 2024-02-17 NOTE — Progress Notes (Signed)
 [facility] Progress Note  SUBJECTIVE:   Interval history: Raymond Hurst was seen and evaluated today at bedside. Resting comfortably in bed.  Denied abdominal pain.  No chest pain or shortness of breath.  Tolerating diet.  No bowel movement overnight.  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  He had nocturnal hypoxemia with O2 sat nadir of 70%. Now on CPAP   Pulmonary mass    Stroke [facility])    Past Surgical History:  Procedure Laterality Date   BRONCHIAL BIOPSY  06/12/2023   Procedure: BRONCHOSCOPY, WITH BIOPSY;  Surgeon: Shelah Lamar RAMAN, MD;  Location: [facility] ENDOSCOPY;  Service: Cardiopulmonary;;   BRONCHIAL BRUSHINGS  06/12/2023   Procedure: BRONCHOSCOPY, WITH BRUSH BIOPSY;  Surgeon: Shelah Lamar RAMAN, MD;  Location: [facility] ENDOSCOPY;  Service: Cardiopulmonary;;   BRONCHIAL NEEDLE ASPIRATION BIOPSY  06/12/2023   Procedure: BRONCHOSCOPY, WITH NEEDLE ASPIRATION BIOPSY;  Surgeon: Shelah Lamar RAMAN, MD;  Location: [facility] ENDOSCOPY;  Service: Cardiopulmonary;;   BRONCHIAL NEEDLE ASPIRATION BIOPSY  08/28/2023   Procedure: BRONCHOSCOPY, WITH NEEDLE ASPIRATION BIOPSY;  Surgeon: Shelah Lamar RAMAN, MD;  Location: [facility] ENDOSCOPY;  Service: Pulmonary;;   CRYOTHERAPY  08/28/2023   Procedure: CRYOTHERAPY;  Surgeon: Shelah Lamar RAMAN, MD;  Location: [facility] ENDOSCOPY;  Service: Pulmonary;;   ESOPHAGOGASTRODUODENOSCOPY N/A 09/09/2023   Procedure: EGD (ESOPHAGOGASTRODUODENOSCOPY);  Surgeon: Shaaron Lamar HERO, MD;  Location: [facility] ENDO SUITE;  Service: Endoscopy;  Laterality: N/A;    ESOPHAGOGASTRODUODENOSCOPY N/A 01/08/2024   Procedure: EGD (ESOPHAGOGASTRODUODENOSCOPY);  Surgeon: Cindie Carlin POUR, DO;  Location: [facility] ENDO SUITE;  Service: Endoscopy;  Laterality: N/A;   ESOPHAGOGASTRODUODENOSCOPY N/A 01/17/2024   Procedure: EGD (ESOPHAGOGASTRODUODENOSCOPY);  Surgeon: Cindie Carlin POUR, DO;  Location: [facility] ENDO [facility]ITE;  Service: Endoscopy;  Laterality: N/A;   ESOPHAGOGASTRODUODENOSCOPY N/A 02/05/2024   Procedure: EGD (ESOPHAGOGASTRODUODENOSCOPY);  Surgeon: Stacia Glendia BRAVO, MD;  Location: [facility] ENDOSCOPY;  Service: Gastroenterology;  Laterality: N/A;   ESOPHAGOGASTRODUODENOSCOPY N/A 02/11/2024   Procedure: EGD (ESOPHAGOGASTRODUODENOSCOPY);  Surgeon: Stacia Glendia BRAVO, MD;  Location: [facility] ENDOSCOPY;  Service: Gastroenterology;  Laterality: N/A;   HEMOSTASIS CLIP PLACEMENT  06/12/2023   Procedure: CONTROL OF HEMORRHAGE bronch cold saline and epi;  Surgeon: Shelah Lamar RAMAN, MD;  Location: [facility] ENDOSCOPY;  Service: Cardiopulmonary;;   HEMOSTASIS CLIP PLACEMENT  01/17/2024   Procedure: CONTROL OF HEMORRHAGE, GI TRACT, ENDOSCOPIC, BY CLIPPING OR OVERSEWING;  Surgeon: Cindie Carlin POUR, DO;  Location: [facility] ENDO [facility]ITE;  Service: Endoscopy;;   INNER EAR SURGERY     IR ANGIOGRAM SELECTIVE EACH ADDITIONAL VESSEL  09/09/2023   IR ANGIOGRAM VISCERAL SELECTIVE  09/09/2023   IR ANGIOGRAM VISCERAL SELECTIVE  09/09/2023   IR EMBO ART  VEN HEMORR LYMPH EXTRAV  INC GUIDE ROADMAPPING  09/09/2023   IR THORACENTESIS RIGHT ASP PLEURAL SPACE W/IMG GUIDE  09/26/2023   IR US  GUIDE VASC ACCESS RIGHT  09/09/2023   KNEE SURGERY Left    LAPAROTOMY N/A 01/17/2024   Procedure: LAPAROTOMY,  EXPLORATORY;  Surgeon: Rubin Calamity, MD;  Location: [facility] OR;  Service: General;  Laterality: N/A;   LAPAROTOMY N/A 01/19/2024   Procedure: MACARIO LIVINGS;  Surgeon: Ebbie Cough, MD;  Location: [facility] OR;  Service: General;  Laterality: N/A;  POSSIBLE ABDOMINAL CLOSURE   THORACENTESIS Left 06/06/2023   Procedure: THORACENTESIS;   Surgeon: Arlinda Ranks, MD;  Location: [facility] ENDOSCOPY;  Service: Pulmonary;  Laterality: Left;   TONSILLECTOMY     VIDEO BRONCHOSCOPY WITH ENDOBRONCHIAL NAVIGATION Left 08/28/2023   Procedure: VIDEO BRONCHOSCOPY WITH ENDOBRONCHIAL NAVIGATION;  Surgeon: Shelah Lamar RAMAN, MD;  Location: [facility] ENDOSCOPY;  Service: Pulmonary;  Laterality: Left;   VIDEO BRONCHOSCOPY WITH ENDOBRONCHIAL ULTRASOUND Left 06/12/2023   Procedure: BRONCHOSCOPY, WITH EBUS;  Surgeon: Shelah Lamar RAMAN, MD;  Location: [facility] ENDOSCOPY;  Service: Cardiopulmonary;  Laterality: Left;   VIDEO BRONCHOSCOPY WITH ENDOBRONCHIAL ULTRASOUND  08/28/2023   Procedure: BRONCHOSCOPY, WITH EBUS;  Surgeon: Shelah Lamar RAMAN, MD;  Location: [facility] ENDOSCOPY;  Service: Pulmonary;;   Current Facility-Administered Medications  Medication Dose Route Frequency Provider Last Rate Last Admin   acetaminophen  (TYLENOL ) suppository 650 mg  650 mg Rectal Q6H PRN Sharie Bourbon, MD       budesonide -glycopyrrolate -formoterol  (BREZTRI ) 160-9-4.8 MCG/ACT inhaler 2 puff  2 puff Inhalation BID Samtani, Jai-Gurmukh, MD   2 puff at 02/17/24 0740   carbamide peroxide (DEBROX) 6.5 % OTIC (EAR) solution 5 drop  5 drop Right EAR BID Sherrill Cable Chiloquin, DO   5 drop at 02/17/24 9051   Chlorhexidine  Gluconate Cloth 2 % PADS 6 each  6 each Topical Daily Ebbie Cough, MD   6 each at 02/17/24 0857   feeding supplement (GLUCERNA SHAKE) (GLUCERNA SHAKE) liquid 237 mL  237 mL Oral TID BM Rai, Ripudeep K, MD   237 mL at 02/17/24 9051   Gerhardt's butt cream   Topical BID Cindy Garnette POUR, MD   Given at 02/17/24 0949   heparin  ADULT infusion 100 units/mL (25000 units/250mL)  1,700 Units/hr Intravenous Continuous Hammons, Kimberly B, RPH 17 mL/hr at 02/16/24 2255 1,700 Units/hr at 02/16/24 2255   hydrALAZINE  (APRESOLINE ) injection 20 mg  20 mg Intravenous Q4H PRN Paliwal, Aditya, MD   20 mg at 01/24/24 0831   HYDROcodone -acetaminophen  (NORCO/VICODIN) 5-325 MG per tablet 1 tablet  1 tablet  Oral Q6H PRN Samtani, Jai-Gurmukh, MD       hydrOXYzine  (VISTARIL ) injection 25 mg  25 mg Intramuscular Q6H PRN Paliwal, Aditya, MD   25 mg at 01/24/24 0328   ipratropium-albuterol  (DUONEB) 0.5-2.5 (3) MG/3ML nebulizer solution 3 mL  3 mL Nebulization Q4H PRN Ebbie Cough, MD       labetalol  (NORMODYNE ) injection 20 mg  20 mg Intravenous Q2H PRN Paliwal, Aditya, MD   20 mg at 01/24/24 0630   multivitamin with minerals tablet 1 tablet  1 tablet Oral Daily Chen, Lydia D, RPH   1 tablet at 02/17/24 9157   ondansetron  (ZOFRAN ) injection 4 mg  4 mg Intravenous Q6H PRN Ebbie Cough, MD   4 mg at 02/11/24 0730   Oral care mouth rinse  15 mL Mouth Rinse TID Cindy Garnette POUR, MD   15 mL at 02/17/24 0949   pantoprazole  (PROTONIX ) EC tablet 40 mg  40 mg Oral BID Hammons, Kimberly B, RPH   40 mg at 02/17/24 0842   sodium chloride  flush (NS) 0.9 % injection 10-40 mL  10-40 mL Intracatheter PRN Sharie Bourbon, MD   10 mL at 02/11/24 0924   sucralfate  (CARAFATE ) tablet 1 g  1 g Oral TID WC & HS Cunningham, Scott E, MD   1 g at 02/17/24 9157   Allergies as of 01/16/2024   (No Known Allergies)   Review of Systems:  Review of Systems  Respiratory:  Negative for shortness of breath.   Cardiovascular:  Negative for chest pain.  Gastrointestinal:  Negative for abdominal pain, diarrhea, nausea and vomiting.    OBJECTIVE:   Temp:  [97.8 F (36.6 C)-98.3 F (36.8 C)] 97.9 F (36.6 C) (01/17 0822) Pulse Rate:  [73-102] 73 (01/17 0822) Resp:  [17-18] 18 (01/17 0822) BP: (106-117)/(62-70) 117/62 (01/17 0822) SpO2:  [94 %-100 %] 100 % (01/17 0822) Last BM Date : 02/16/24 Physical Exam Constitutional:      General: He is not in acute distress.    Appearance: He is not ill-appearing, toxic-appearing or diaphoretic.  Cardiovascular:     Rate and Rhythm: Rhythm irregular.  Pulmonary:     Effort: No respiratory distress.     Breath sounds: Normal breath sounds.  Abdominal:     General: Bowel  sounds are normal. There is no distension.     Palpations: Abdomen is soft.     Tenderness: There is no abdominal tenderness. There is no guarding.  Neurological:     Mental Status: He is alert.     Labs: Recent Labs    02/15/24 0742 02/16/24 0616 02/17/24 0557  WBC 6.2 5.6 6.3  HGB 8.4* 7.9* 8.1*  HCT 25.4* 23.8* 24.0*  PLT 159 154 152   BMET No results for input(s): NA, K, CL, CO2, GLUCOSE, BUN, CREATININE, CALCIUM  in the last 72 hours. LFT No results for input(s): PROT, ALBUMIN, AST, ALT, ALKPHOS, BILITOT, BILIDIR, IBILI in the last 72 hours. PT/INR No results for input(s): LABPROT, INR in the last 72 hours. Diagnostic imaging: No results found.  IMPRESSION: Duodenal ulcer - EGD 09/09/2023 [facility] showed D1D2 ulcer straddling the D1/D2 1.5 cm  - IR GDA micro coil embolization 09/09/2023 - EGD 01/08/24 non-bleeding duodenal ulcer with clean base  - EGD 01/08/2024 [facility] showed D1 ulcer 6-8 mm   - EGD 01/17/24 [facility] showed duodenal ulcer  - Duodenotomy, ligation of GDA and branches 01/17/24  - EGD 02/05/24 [facility] diffuse moderately friable polypoid mucosa in D1  - EGD 02/11/24 [facility] suture material in duodenal bulb, non-bleeding D2 diverticulum Acute blood loss anemia Atrial fibrillation currently on heparin  (prior to admission on Eliquis ) History stage IV lung cancer COPD  PLAN: -Hgb stable today, trend H/H, transfuse for Hgb < 7 -Tolerating diet, continue -Patient would like to monitor his stool today prior to initiating Eliquis  -[facility] will follow   LOS: 31 days   Estefana Keas, [facility]

## 2024-02-17 NOTE — Progress Notes (Signed)
 PHARMACY - ANTICOAGULATION CONSULT NOTE  Pharmacy Consult for heparin   Indication: atrial fibrillation and chronic RUE DVT  Allergies[1]  Patient Measurements: Height: 6' 0.01 (182.9 cm) Weight: 128.2 kg (282 lb 10.1 oz) (Bed weight) IBW/kg (Calculated) : 77.62 HEPARIN  DW (KG): 103.8  Vital Signs: Temp: 97.9 F (36.6 C) (01/17 0421) BP: 110/67 (01/17 0421) Pulse Rate: 84 (01/17 0421)  Labs: Recent Labs    02/15/24 0742 02/15/24 1938 02/16/24 0616 02/17/24 0557  HGB 8.4*  --  7.9* 8.1*  HCT 25.4*  --  23.8* 24.0*  PLT 159  --  154 152  HEPARINUNFRC 0.69 0.43 0.37 0.42    Estimated Creatinine Clearance: 77.6 mL/min (by C-G formula based on SCr of 1.19 mg/dL).   Assessment: 64 YOM w/ Hx AF with a RUE DVT in need of AC. Apixaban  PTA. Last dose was >7 days ago. He was admitted to [facility] w/ duodenal ulcer hemorrhage which was clipped on 12/17.   Resuming heparin , with plans to monitor Hgb for 3-5 days while on heparin  before resuming apixaban .  Heparin  level therapeutic on 1700 units/hr.  Hgb down slightly 8.1.  Will monitor.   Goal of Therapy:  Heparin  level 0.3-0.5 units/mL Monitor platelets by anticoagulation protocol: Yes   Plan:  Continue heparin  infusion at 1700 units/hr Check heparin  level daily while on heparin  Continue to monitor H&H and platelets Follow-up plans to restart Apixaban  if Hgb remains stable.  Thank you. Olam Monte, PharmD **Pharmacist phone directory can be found on [email or web address] listed under [facility].  02/17/2024 8:12 AM        [1] No Known Allergies

## 2024-02-17 NOTE — Progress Notes (Signed)
 "          [name]                                                                              Raymond Hurst, is a 74 y.o. male, DOB - 1950-10-21, FMW:[id number] Admit date - 01/16/2024    Outpatient Primary MD for the patient is [name], MD  LOS - 31  days  Chief Complaint  Patient presents with   Hematemesis       Brief summary   Patient is a 74 year old male with Left lung mass bronchogenic CA (poorly differentiated squamous cell) diagnosed 06/05/2023-bronchoscopy 08/09/2023 complicated by left PNA thorax-follows with Dr. [name], A-fib on Eliquis , previous alcohol use, UGIB 09/09/2023 cratered ulcer duodenum status post mesenteric angio/embolization at [facility]  Readmission 12/6-12/8 with upper GI bleed.  EGD clean-based ulcer told to continue twice daily PPI manage resumed Eliquis  hemoglobin was 9.6 on discharge at that time   Significant Events: 12/16 [facility] ED upper GI bleed with bright red vomiting  12/17 : Transferred to [facility].  Status post duodenal bleed Clip by IR.  Continued ongoing bleeding requiring pressors, massive transfusion protocol, emergent ex lap Dr. Rubin with duodenotomy ligation GDA branches VAC placement 12/19 Secondary closure of abdomen Dr. Ebbie  12/26 barium swallow performed and upper GI 1226 without evidence of leak.  Prolonged hospital course with intermittent bleeding.  Repeat EGD on 1/5 showed no active bleeding or high risk bleeding lesions. EGD 1/5 showed no active bleeding or high risk bleeding lesions and the bleeding was suspected to be from friable mucosa in the duodenal bulb at the site of surgical repair after DOAC started  Heparin  was stopped, underwent repeat EGD 1/11 with no active bleeding. 1/12: Hemoglobin continues to trend down, symptomatic.  GI recommended observation, no Pete colonoscopy.  Wife frustrated and requested second GI opinion, accepted graciously by GI. Cardiology consulted. 1/13/26[name]  recommended continue heparin  drip resuming heparin  drip until no further GI bleeding before resuming Eliquis    Assessment & Plan    Duodenal ulcer hemorrhage with coiling 09/2023  s/p laparotomy 12/17, secondary closure 12/19 as above -Vomiting resolved since 12/26.  Off TPN on 12/31.  JP drain removed 12/31 - Now tolerating regular diet    Recurrent GI Bleed and Symptomatic Anemia, Duodenal ulcer - Prolonged hospitalization due to intermittent GI bleeding.  Has had 5 EGDs so far, last performed on 02/11/2024. -02/03/24 and 02/11/24 CTA GIB negative for active bleeding -EGD (1/5) showed no active bleeding or high risk bleeding lesions and the bleeding was suspected to be from friable mucosa in the duodenal bulb at the site of surgical repair after DOAC started -Heparin  gtt was stopped  - Repeat EGD 02/11/24: Normal esophagus with erythematous mucosa in the gastric body with intact suture and erythematous friable mucosa but no evidence of recent bleeding for this area.  Given nonbleeding duodenal diverticulum, felt that the source of his new recurrent bleeding is from another source and possibly a diverticulum - Continue PI twice daily, sucralfate  1 g 4 times daily - Anemia profile showed iron  57, ferritin 518, folate> 20, B12 904 - Hb improving 8.1 today, pt wants to  wait for BM today before tranistioning yo eliquis  trial   Sepsis, Staph epi Bacteremia-MRSE with resistances Likely peritonitis - Bcx 12/21 postive for staph epi, blood cultures 12/26 showed no growth. - Completed course of antibiotics on 12/30 - RIJ CVC removed 02/02/24    AKI on admission, Metabolic Acidosis - Baseline Cr 0.7 - 0.9.  Likely worsened due to #1, hypotension, GI bleed. - Resolved, creatinine at baseline    Atrial Fibrillation:  - CHADVASC >4. - Cardiology consulted and had extensive discussion with patient and family.   - If H&H remained stable, no further bleeding, then retrial eliquis .  Can consider Watchman  procedure but will need to demonstrate tolerability for [facility] for 45 days post watchman then dual antiplatelet therapy followed by single antiplatelet therapy. - currently on IV heparin  gtt, awaiting BM before transition to eliquis      Right Cephalic Vein Acute SVT and Right Brachial Vein Acute DVT:  - Noted on RUE doppler on 12/29 -Currently on IV heparin  drip    Hypernatremia:  - Resolved.   Hypokalemia:  - Replace as needed   Urinary Retention:  - Foley was initially removed and patient was voiding well.  - However, noted to be retaining again so Foley/indwelling cath placed and will be continue at discharge. Will need outpt f/u with Urology   Left Lung Mass Bronchogenic CA:  - Holding Immunotherapy/Chemotherapy. Will need Outpatient follow-up   Critical illness Thrombocytopenia: - Continue to follow    Moderate protein calorie malnutrition Nutrition Problem: Moderate Malnutrition Etiology: chronic illness (COPD, CVA, GIB, NSCLC) Signs/Symptoms: moderate fat depletion, severe muscle depletion, edema - Off TPN   Obesity class III  Estimated body mass index is 38.32 kg/m as calculated from the following:   Height as of this encounter: 6' 0.01 (1.829 m).   Weight as of this encounter: 128.2 kg.  Code Status: Full code DVT Prophylaxis:  Place TED hose Start: 02/15/24 1250 Place TED hose Start: 02/11/24 0809 SCDs Start: 01/16/24 1731   Level of Care: Level of care: Telemetry Family Communication: Updated patient Disposition Plan:      Remains inpatient appropriate:      Procedures:    Consultants:   Cardiology General Surgery Now [facility] gastroenterology  Antimicrobials:   Anti-infectives (From admission, onward)    Start     Dose/Rate Route Frequency Ordered Stop   01/29/24 2200  fluconazole  (DIFLUCAN ) IVPB 400 mg  Status:  Discontinued        400 mg 100 mL/hr over 120 Minutes Intravenous Every 24 hours 01/29/24 1336 01/29/24 1346   01/29/24 2200   fluconazole  (DIFLUCAN ) IVPB 400 mg  Status:  Discontinued        400 mg 200 mL/hr over 60 Minutes Intravenous Every 24 hours 01/29/24 1346 01/30/24 1533   01/25/24 1500  vancomycin  (VANCOREADY) IVPB 1500 mg/300 mL  Status:  Discontinued        1,500 mg 150 mL/hr over 120 Minutes Intravenous Every 24 hours 01/24/24 1322 01/30/24 1233   01/24/24 1115  vancomycin  (VANCOCIN ) 2,500 mg in sodium chloride  0.9 % 500 mL IVPB        2,500 mg 262.5 mL/hr over 120 Minutes Intravenous  Once 01/24/24 1029 01/24/24 1530   01/17/24 2245  piperacillin -tazobactam (ZOSYN ) IVPB 3.375 g  Status:  Discontinued        3.375 g 12.5 mL/hr over 240 Minutes Intravenous Every 8 hours 01/17/24 2149 01/30/24 1233   01/17/24 2200  fluconazole  (DIFLUCAN ) IVPB 400 mg  Status:  Discontinued        400 mg 100 mL/hr over 120 Minutes Intravenous Every 24 hours 01/17/24 2059 01/29/24 1336          Medications  budesonide -glycopyrrolate -formoterol   2 puff Inhalation BID   carbamide peroxide  5 drop Right EAR BID   Chlorhexidine  Gluconate Cloth  6 each Topical Daily   feeding supplement (GLUCERNA SHAKE)  237 mL Oral TID BM   Gerhardt's butt cream   Topical BID   multivitamin with minerals  1 tablet Oral Daily   mouth rinse  15 mL Mouth Rinse TID   pantoprazole   40 mg Oral BID   sucralfate   1 g Oral TID WC & HS      Subjective:   Raymond Hurst was seen and examined today.  Hb stable. Awaiting BM today, no active bleeding.   Objective:   Vitals:   02/16/24 2015 02/17/24 0421 02/17/24 0740 02/17/24 0822  BP:  110/67  117/62  Pulse: 99 84  73  Resp:  18  18  Temp:  97.9 F (36.6 C)  97.9 F (36.6 C)  TempSrc:    Oral  SpO2: 94% 100% 99% 100%  Weight:      Height:        Intake/Output Summary (Last 24 hours) at 02/17/2024 1238 Last data filed at 02/17/2024 0554 Gross per 24 hour  Intake --  Output 3700 ml  Net -3700 ml     Wt Readings from Last 3 Encounters:  02/15/24 128.2 kg  01/12/24 119.7 kg   01/06/24 118.2 kg    Physical Exam General: Alert and oriented x 3, NAD Cardiovascular: S1 S2 clear, RRR.  Respiratory: CTAB, no wheezing Gastrointestinal: Soft, nontender, nondistended, NBS Ext: no pedal edema bilaterally Psych: Normal affect     Data Reviewed:  I have personally reviewed following labs    CBC Lab Results  Component Value Date   WBC 6.3 02/17/2024   RBC 2.60 (L) 02/17/2024   HGB 8.1 (L) 02/17/2024   HCT 24.0 (L) 02/17/2024   MCV 92.3 02/17/2024   MCH 31.2 02/17/2024   PLT 152 02/17/2024   MCHC 33.8 02/17/2024   RDW 18.6 (H) 02/17/2024   LYMPHSABS 0.7 02/14/2024   MONOABS 0.5 02/14/2024   EOSABS 0.2 02/14/2024   BASOSABS 0.0 02/14/2024     Last metabolic panel Lab Results  Component Value Date   NA 138 02/14/2024   K 3.5 02/14/2024   CL 105 02/14/2024   CO2 23 02/14/2024   BUN 22 02/14/2024   CREATININE 1.19 02/14/2024   GLUCOSE 96 02/14/2024   GFRNONAA >60 02/14/2024   GFRAA >60 06/13/2019   CALCIUM  8.9 02/14/2024   PHOS 3.2 02/14/2024   PROT 5.7 (L) 02/14/2024   ALBUMIN 3.3 (L) 02/14/2024   LABGLOB 3.2 06/01/2023   AGRATIO 1.3 07/21/2020   BILITOT 0.4 02/14/2024   ALKPHOS 64 02/14/2024   AST 17 02/14/2024   ALT 16 02/14/2024   ANIONGAP 11 02/14/2024    CBG (last 3)  No results for input(s): GLUCAP in the last 72 hours.    Coagulation Profile: No results for input(s): INR, PROTIME in the last 168 hours.   Radiology Studies: I have personally reviewed the imaging studies  No results found.     Nydia Distance M.D. [name] 02/17/2024, 12:38 PM  Available via Epic secure chat 7am-7pm After 7 pm, please refer to night coverage provider listed on amion.    "

## 2024-02-18 DIAGNOSIS — I482 Chronic atrial fibrillation, unspecified: Secondary | ICD-10-CM | POA: Diagnosis not present

## 2024-02-18 DIAGNOSIS — G4733 Obstructive sleep apnea (adult) (pediatric): Secondary | ICD-10-CM | POA: Diagnosis not present

## 2024-02-18 DIAGNOSIS — K922 Gastrointestinal hemorrhage, unspecified: Secondary | ICD-10-CM | POA: Diagnosis not present

## 2024-02-18 DIAGNOSIS — D62 Acute posthemorrhagic anemia: Secondary | ICD-10-CM | POA: Diagnosis not present

## 2024-02-18 LAB — CBC
HCT: 23.8 % — ABNORMAL LOW (ref 39.0–52.0)
Hemoglobin: 8 g/dL — ABNORMAL LOW (ref 13.0–17.0)
MCH: 31.1 pg (ref 26.0–34.0)
MCHC: 33.6 g/dL (ref 30.0–36.0)
MCV: 92.6 fL (ref 80.0–100.0)
Platelets: 152 K/uL (ref 150–400)
RBC: 2.57 MIL/uL — ABNORMAL LOW (ref 4.22–5.81)
RDW: 18.8 % — ABNORMAL HIGH (ref 11.5–15.5)
WBC: 6.1 K/uL (ref 4.0–10.5)
nRBC: 0 % (ref 0.0–0.2)

## 2024-02-18 LAB — HEPARIN LEVEL (UNFRACTIONATED): Heparin Unfractionated: 0.33 [IU]/mL (ref 0.30–0.70)

## 2024-02-18 MED ORDER — SENNOSIDES-DOCUSATE SODIUM 8.6-50 MG PO TABS
1.0000 | ORAL_TABLET | Freq: Two times a day (BID) | ORAL | Status: DC
  Administered 2024-02-19 – 2024-02-21 (×5): 1 via ORAL
  Filled 2024-02-18 (×6): qty 1

## 2024-02-18 MED ORDER — LACTULOSE 10 GM/15ML PO SOLN: 20.0000 g | Freq: Once | ORAL | Status: DC

## 2024-02-18 MED ORDER — POLYETHYLENE GLYCOL 3350 17 G PO PACK
17.0000 g | PACK | Freq: Two times a day (BID) | ORAL | Status: AC
  Filled 2024-02-18 (×2): qty 1

## 2024-02-18 NOTE — Progress Notes (Signed)
 "          [name]                                                                              Tavin Vernet, is a 74 y.o. male, DOB - July 24, 1950, FMW:[id number] Admit date - 01/16/2024    Outpatient Primary MD for the patient is [name], MD  LOS - 32  days  Chief Complaint  Patient presents with   Hematemesis       Brief summary   Patient is a 74 year old male with Left lung mass bronchogenic CA (poorly differentiated squamous cell) diagnosed 06/05/2023-bronchoscopy 08/09/2023 complicated by left PNA thorax-follows with Dr. [name], A-fib on Eliquis , previous alcohol use, UGIB 09/09/2023 cratered ulcer duodenum status post mesenteric angio/embolization at [facility].  Readmission 12/6-12/8 with upper GI bleed.  EGD clean-based ulcer told to continue twice daily PPI manage resumed Eliquis  hemoglobin was 9.6 on discharge at that time   Significant Events: 12/16 [facility] ED upper GI bleed with bright red vomiting  12/17 : Transferred to [facility].  Status post duodenal bleed Clip by IR.  Continued ongoing bleeding requiring pressors, massive transfusion protocol, emergent ex lap Dr. Rubin with duodenotomy ligation GDA branches VAC placement 12/19 Secondary closure of abdomen Dr. Ebbie  12/26 barium swallow performed and upper GI 1226 without evidence of leak.  Prolonged hospital course with intermittent bleeding.  Repeat EGD on 1/5 showed no active bleeding or high risk bleeding lesions. EGD 1/5 showed no active bleeding or high risk bleeding lesions and the bleeding was suspected to be from friable mucosa in the duodenal bulb at the site of surgical repair after DOAC started  Heparin  was stopped, underwent repeat EGD 1/11 with no active bleeding. 1/12: Hemoglobin continues to trend down, symptomatic.  GI recommended observation, no Pete colonoscopy.  Wife frustrated and requested second GI opinion, accepted graciously by GI. Cardiology consulted. 1/13/26[name]  recommended continue heparin  drip resuming heparin  drip until no further GI bleeding before resuming Eliquis    Assessment & Plan    Duodenal ulcer hemorrhage with coiling 09/2023  s/p laparotomy 12/17, secondary closure 12/19 as above -Vomiting resolved since 12/26.  Off TPN on 12/31.  JP drain removed 12/31 - Now tolerating regular diet    Recurrent GI Bleed and Symptomatic Anemia, Duodenal ulcer - Prolonged hospitalization due to intermittent GI bleeding.  Has had 5 EGDs so far, last performed on 02/11/2024. -02/03/24 and 02/11/24 CTA GIB negative for active bleeding -EGD (1/5) showed no active bleeding or high risk bleeding lesions and the bleeding was suspected to be from friable mucosa in the duodenal bulb at the site of surgical repair after DOAC started -Heparin  gtt was stopped  - Repeat EGD 02/11/24: Normal esophagus with erythematous mucosa in the gastric body with intact suture and erythematous friable mucosa but no evidence of recent bleeding for this area.  Given nonbleeding duodenal diverticulum, felt that the source of his new recurrent bleeding is from another source and possibly a diverticulum - Continue PI twice daily, sucralfate  1 g 4 times daily - Anemia profile showed iron  57, ferritin 518, folate> 20, B12 904 - Patient wants to wait till BM before  starting eliquis   Sepsis, Staph epi Bacteremia-MRSE with resistances Likely peritonitis - Bcx 12/21 postive for staph epi, blood cultures 12/26 showed no growth. - Completed course of antibiotics on 12/30 - RIJ CVC removed 02/02/24    AKI on admission, Metabolic Acidosis - Baseline Cr 0.7 - 0.9.  Likely worsened due to #1, hypotension, GI bleed. - Resolved, creatinine at baseline    Atrial Fibrillation:  - CHADVASC >4. - Cardiology consulted and had extensive discussion with patient and family.   - If H&H remained stable, no further bleeding, then retrial eliquis .  Can consider Watchman procedure but will need to demonstrate  tolerability for [facility] for 45 days post watchman then dual antiplatelet therapy followed by single antiplatelet therapy. - currently on IV heparin  gtt, awaiting BM before transition to eliquis    Constipation - Placed on lactulose  x 1, MiraLAX  twice daily, senna   Right Cephalic Vein Acute SVT and Right Brachial Vein Acute DVT:  - Noted on RUE doppler on 12/29 -Currently on IV heparin  drip    Hypernatremia:  - Resolved.   Hypokalemia:  - Replace as needed   Urinary Retention:  - Foley was initially removed and patient was voiding well.  - However, noted to be retaining again so Foley/indwelling cath placed and will be continue at discharge. Will need outpt f/u with Urology   Left Lung Mass Bronchogenic CA:  - Holding Immunotherapy/Chemotherapy. Will need Outpatient follow-up   Critical illness Thrombocytopenia: - Continue to follow    Moderate protein calorie malnutrition Nutrition Problem: Moderate Malnutrition Etiology: chronic illness (COPD, CVA, GIB, NSCLC) Signs/Symptoms: moderate fat depletion, severe muscle depletion, edema - Off TPN   Obesity class III  Estimated body mass index is 38.32 kg/m as calculated from the following:   Height as of this encounter: 6' 0.01 (1.829 m).   Weight as of this encounter: 128.2 kg.  Code Status: Full code DVT Prophylaxis:  Place TED hose Start: 02/15/24 1250 Place TED hose Start: 02/11/24 0809 SCDs Start: 01/16/24 1731   Level of Care: Level of care: Telemetry Family Communication: Updated patient Disposition Plan:      Remains inpatient appropriate:      Procedures:    Consultants:   Cardiology General Surgery Now [facility] gastroenterology  Antimicrobials:   Anti-infectives (From admission, onward)    Start     Dose/Rate Route Frequency Ordered Stop   01/29/24 2200  fluconazole  (DIFLUCAN ) IVPB 400 mg  Status:  Discontinued        400 mg 100 mL/hr over 120 Minutes Intravenous Every 24 hours 01/29/24 1336 01/29/24  1346   01/29/24 2200  fluconazole  (DIFLUCAN ) IVPB 400 mg  Status:  Discontinued        400 mg 200 mL/hr over 60 Minutes Intravenous Every 24 hours 01/29/24 1346 01/30/24 1533   01/25/24 1500  vancomycin  (VANCOREADY) IVPB 1500 mg/300 mL  Status:  Discontinued        1,500 mg 150 mL/hr over 120 Minutes Intravenous Every 24 hours 01/24/24 1322 01/30/24 1233   01/24/24 1115  vancomycin  (VANCOCIN ) 2,500 mg in sodium chloride  0.9 % 500 mL IVPB        2,500 mg 262.5 mL/hr over 120 Minutes Intravenous  Once 01/24/24 1029 01/24/24 1530   01/17/24 2245  piperacillin -tazobactam (ZOSYN ) IVPB 3.375 g  Status:  Discontinued        3.375 g 12.5 mL/hr over 240 Minutes Intravenous Every 8 hours 01/17/24 2149 01/30/24 1233   01/17/24 2200  fluconazole  (DIFLUCAN ) IVPB 400  mg  Status:  Discontinued        400 mg 100 mL/hr over 120 Minutes Intravenous Every 24 hours 01/17/24 2059 01/29/24 1336          Medications  budesonide -glycopyrrolate -formoterol   2 puff Inhalation BID   carbamide peroxide  5 drop Right EAR BID   Chlorhexidine  Gluconate Cloth  6 each Topical Daily   feeding supplement (GLUCERNA SHAKE)  237 mL Oral TID BM   Gerhardt's butt cream   Topical BID   lactulose   20 g Oral Once   multivitamin with minerals  1 tablet Oral Daily   mouth rinse  15 mL Mouth Rinse TID   pantoprazole   40 mg Oral BID   polyethylene glycol  17 g Oral BID   senna-docusate  1 tablet Oral BID   sucralfate   1 g Oral TID WC & HS      Subjective:   Raymond Hurst was seen and examined today.  Hemoglobin stable, constipated, wants to wait for BM before sleep changing to eliquis .  No abdominal pain, nausea or vomiting.  No active bleeding.  Objective:   Vitals:   02/17/24 2032 02/18/24 0511 02/18/24 0731 02/18/24 0752  BP:  116/66  118/69  Pulse:  85  87  Resp:  17  18  Temp:  97.7 F (36.5 C)    TempSrc:  Oral    SpO2: 98% 97% 96% 98%  Weight:      Height:        Intake/Output Summary (Last 24  hours) at 02/18/2024 1308 Last data filed at 02/18/2024 0612 Gross per 24 hour  Intake 366.9 ml  Output 1400 ml  Net -1033.1 ml     Wt Readings from Last 3 Encounters:  02/15/24 128.2 kg  01/12/24 119.7 kg  01/06/24 118.2 kg   Physical Exam General: Alert and oriented x 3, NAD Cardiovascular: S1 S2 clear, RRR.  Respiratory: CTAB, no wheezing Gastrointestinal: Soft, nontender, nondistended, NBS Ext: no pedal edema bilaterally Neuro: no new deficits Psych: Normal affect   Data Reviewed:  I have personally reviewed following labs    CBC Lab Results  Component Value Date   WBC 6.1 02/18/2024   RBC 2.57 (L) 02/18/2024   HGB 8.0 (L) 02/18/2024   HCT 23.8 (L) 02/18/2024   MCV 92.6 02/18/2024   MCH 31.1 02/18/2024   PLT 152 02/18/2024   MCHC 33.6 02/18/2024   RDW 18.8 (H) 02/18/2024   LYMPHSABS 0.7 02/14/2024   MONOABS 0.5 02/14/2024   EOSABS 0.2 02/14/2024   BASOSABS 0.0 02/14/2024     Last metabolic panel Lab Results  Component Value Date   NA 138 02/14/2024   K 3.5 02/14/2024   CL 105 02/14/2024   CO2 23 02/14/2024   BUN 22 02/14/2024   CREATININE 1.19 02/14/2024   GLUCOSE 96 02/14/2024   GFRNONAA >60 02/14/2024   GFRAA >60 06/13/2019   CALCIUM  8.9 02/14/2024   PHOS 3.2 02/14/2024   PROT 5.7 (L) 02/14/2024   ALBUMIN 3.3 (L) 02/14/2024   LABGLOB 3.2 06/01/2023   AGRATIO 1.3 07/21/2020   BILITOT 0.4 02/14/2024   ALKPHOS 64 02/14/2024   AST 17 02/14/2024   ALT 16 02/14/2024   ANIONGAP 11 02/14/2024    CBG (last 3)  No results for input(s): GLUCAP in the last 72 hours.    Coagulation Profile: No results for input(s): INR, PROTIME in the last 168 hours.   Radiology Studies: I have personally reviewed the imaging studies  No  results found.     Nydia Distance M.D. [name] 02/18/2024, 1:08 PM  Available via Epic secure chat 7am-7pm After 7 pm, please refer to night coverage provider listed on amion.    "

## 2024-02-18 NOTE — Plan of Care (Signed)
   Problem: Health Behavior/Discharge Planning: Goal: Ability to manage health-related needs will improve Outcome: Progressing

## 2024-02-18 NOTE — Progress Notes (Signed)
 PHARMACY - ANTICOAGULATION CONSULT NOTE  Pharmacy Consult for heparin   Indication: atrial fibrillation and chronic RUE DVT  Allergies[1]  Patient Measurements: Height: 6' 0.01 (182.9 cm) Weight: 128.2 kg (282 lb 10.1 oz) (Bed weight) IBW/kg (Calculated) : 77.62 HEPARIN  DW (KG): 103.8  Vital Signs: Temp: 97.7 F (36.5 C) (01/18 0511) Temp Source: Oral (01/18 0511) BP: 116/66 (01/18 0511) Pulse Rate: 85 (01/18 0511)  Labs: Recent Labs    02/16/24 0616 02/17/24 0557 02/18/24 0411  HGB 7.9* 8.1* 8.0*  HCT 23.8* 24.0* 23.8*  PLT 154 152 152  HEPARINUNFRC 0.37 0.42 0.33    Estimated Creatinine Clearance: 77.6 mL/min (by C-G formula based on SCr of 1.19 mg/dL).   Assessment: 19 YOM w/ Hx AF with a RUE DVT in need of AC. Apixaban  PTA. Last dose was >7 days ago. He was admitted to [facility] w/ duodenal ulcer hemorrhage which was clipped on 12/17.   Resuming heparin , with plans to monitor Hgb for 3-5 days while on heparin  before resuming apixaban .  Heparin  level therapeutic on 1700 units/hr.  Hgb down slightly 8.  Goal of Therapy:  Heparin  level 0.3-0.5 units/mL Monitor platelets by anticoagulation protocol: Yes   Plan:  Continue heparin  infusion at 1700 units/hr Check heparin  level daily while on heparin  Continue to monitor H&H and platelets Follow-up plans to restart Apixaban  if Hgb remains stable.  Thank you. Olam Monte, PharmD **Pharmacist phone directory can be found on [email or web address] listed under [facility].  02/18/2024 8:37 AM         [1] No Known Allergies

## 2024-02-18 NOTE — Progress Notes (Signed)
 [facility] Progress Note  SUBJECTIVE:   Interval history: Raymond Hurst was seen and evaluated today at bedside. Resting comfortably in bed. Denied bowel movement for few days, requesting assistance to use commode. No chest pain, shortness of breath, abdominal pain.   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  He had nocturnal hypoxemia with O2 sat nadir of 70%. Now on CPAP   Pulmonary mass    Stroke [facility])    Past Surgical History:  Procedure Laterality Date   BRONCHIAL BIOPSY  06/12/2023   Procedure: BRONCHOSCOPY, WITH BIOPSY;  Surgeon: Shelah Lamar RAMAN, MD;  Location: [facility] ENDOSCOPY;  Service: Cardiopulmonary;;   BRONCHIAL BRUSHINGS  06/12/2023   Procedure: BRONCHOSCOPY, WITH BRUSH BIOPSY;  Surgeon: Shelah Lamar RAMAN, MD;  Location: [facility] ENDOSCOPY;  Service: Cardiopulmonary;;   BRONCHIAL NEEDLE ASPIRATION BIOPSY  06/12/2023   Procedure: BRONCHOSCOPY, WITH NEEDLE ASPIRATION BIOPSY;  Surgeon: Shelah Lamar RAMAN, MD;  Location: [facility] ENDOSCOPY;  Service: Cardiopulmonary;;   BRONCHIAL NEEDLE ASPIRATION BIOPSY  08/28/2023   Procedure: BRONCHOSCOPY, WITH NEEDLE ASPIRATION BIOPSY;  Surgeon: Shelah Lamar RAMAN, MD;  Location: [facility] ENDOSCOPY;  Service: Pulmonary;;   CRYOTHERAPY  08/28/2023   Procedure: CRYOTHERAPY;  Surgeon: Shelah Lamar RAMAN, MD;  Location: [facility] ENDOSCOPY;  Service: Pulmonary;;   ESOPHAGOGASTRODUODENOSCOPY N/A 09/09/2023   Procedure: EGD (ESOPHAGOGASTRODUODENOSCOPY);  Surgeon: Shaaron Lamar HERO, MD;  Location: [facility] ENDO SUITE;  Service: Endoscopy;  Laterality: N/A;    ESOPHAGOGASTRODUODENOSCOPY N/A 01/08/2024   Procedure: EGD (ESOPHAGOGASTRODUODENOSCOPY);  Surgeon: Cindie Carlin POUR, DO;  Location: [facility] ENDO SUITE;  Service: Endoscopy;  Laterality: N/A;   ESOPHAGOGASTRODUODENOSCOPY N/A 01/17/2024   Procedure: EGD (ESOPHAGOGASTRODUODENOSCOPY);  Surgeon: Cindie Carlin POUR, DO;  Location: [facility] ENDO SUITE;  Service: Endoscopy;  Laterality: N/A;   ESOPHAGOGASTRODUODENOSCOPY N/A 02/05/2024   Procedure: EGD (ESOPHAGOGASTRODUODENOSCOPY);  Surgeon: Stacia Glendia BRAVO, MD;  Location: [facility] ENDOSCOPY;  Service: Gastroenterology;  Laterality: N/A;   ESOPHAGOGASTRODUODENOSCOPY N/A 02/11/2024   Procedure: EGD (ESOPHAGOGASTRODUODENOSCOPY);  Surgeon: Stacia Glendia BRAVO, MD;  Location: [facility] ENDOSCOPY;  Service: Gastroenterology;  Laterality: N/A;   HEMOSTASIS CLIP PLACEMENT  06/12/2023   Procedure: CONTROL OF HEMORRHAGE bronch cold saline and epi;  Surgeon: Shelah Lamar RAMAN, MD;  Location: [facility] ENDOSCOPY;  Service: Cardiopulmonary;;   HEMOSTASIS CLIP PLACEMENT  01/17/2024   Procedure: CONTROL OF HEMORRHAGE, GI TRACT, ENDOSCOPIC, BY CLIPPING OR OVERSEWING;  Surgeon: Cindie Carlin POUR, DO;  Location: [facility] ENDO SUITE;  Service: Endoscopy;;   INNER EAR SURGERY     IR ANGIOGRAM SELECTIVE EACH ADDITIONAL VESSEL  09/09/2023   IR ANGIOGRAM VISCERAL SELECTIVE  09/09/2023   IR ANGIOGRAM VISCERAL SELECTIVE  09/09/2023   IR EMBO ART  VEN HEMORR LYMPH EXTRAV  INC GUIDE ROADMAPPING  09/09/2023   IR THORACENTESIS RIGHT ASP PLEURAL SPACE W/IMG GUIDE  09/26/2023   IR US  GUIDE VASC ACCESS RIGHT  09/09/2023   KNEE SURGERY Left    LAPAROTOMY N/A 01/17/2024   Procedure: LAPAROTOMY,  EXPLORATORY;  Surgeon: Rubin Calamity, MD;  Location: [facility] OR;  Service: General;  Laterality: N/A;   LAPAROTOMY N/A 01/19/2024   Procedure: MACARIO LIVINGS;  Surgeon: Ebbie Cough, MD;  Location: [facility] OR;  Service: General;  Laterality: N/A;  POSSIBLE ABDOMINAL CLOSURE   THORACENTESIS Left 06/06/2023   Procedure: THORACENTESIS;   Surgeon: Arlinda Ranks, MD;  Location: [facility] ENDOSCOPY;  Service: Pulmonary;  Laterality: Left;   TONSILLECTOMY     VIDEO BRONCHOSCOPY WITH ENDOBRONCHIAL NAVIGATION Left 08/28/2023   Procedure: VIDEO BRONCHOSCOPY WITH ENDOBRONCHIAL NAVIGATION;  Surgeon: Shelah Lamar RAMAN, MD;  Location: [facility] ENDOSCOPY;  Service: Pulmonary;  Laterality: Left;   VIDEO BRONCHOSCOPY WITH ENDOBRONCHIAL ULTRASOUND Left 06/12/2023   Procedure: BRONCHOSCOPY, WITH EBUS;  Surgeon: Shelah Lamar RAMAN, MD;  Location: [facility] ENDOSCOPY;  Service: Cardiopulmonary;  Laterality: Left;   VIDEO BRONCHOSCOPY WITH ENDOBRONCHIAL ULTRASOUND  08/28/2023   Procedure: BRONCHOSCOPY, WITH EBUS;  Surgeon: Shelah Lamar RAMAN, MD;  Location: [facility] ENDOSCOPY;  Service: Pulmonary;;   Current Facility-Administered Medications  Medication Dose Route Frequency Provider Last Rate Last Admin   acetaminophen  (TYLENOL ) suppository 650 mg  650 mg Rectal Q6H PRN Sharie Bourbon, MD       budesonide -glycopyrrolate -formoterol  (BREZTRI ) 160-9-4.8 MCG/ACT inhaler 2 puff  2 puff Inhalation BID Samtani, Jai-Gurmukh, MD   2 puff at 02/18/24 0731   carbamide peroxide (DEBROX) 6.5 % OTIC (EAR) solution 5 drop  5 drop Right EAR BID Sheikh, Omair [place], DO   5 drop at 02/18/24 9045   Chlorhexidine  Gluconate Cloth 2 % PADS 6 each  6 each Topical Daily Ebbie Cough, MD   6 each at 02/18/24 0954   feeding supplement (GLUCERNA SHAKE) (GLUCERNA SHAKE) liquid 237 mL  237 mL Oral TID BM Rai, Ripudeep K, MD   237 mL at 02/18/24 1000   Gerhardt's butt cream   Topical BID Cindy Garnette POUR, MD   Given at 02/18/24 0954   heparin  ADULT infusion 100 units/mL (25000 units/250mL)  1,700 Units/hr Intravenous Continuous Hammons, Kimberly B, RPH 17 mL/hr at 02/18/24 0436 1,700 Units/hr at 02/18/24 0436   hydrALAZINE  (APRESOLINE ) injection 20 mg  20 mg Intravenous Q4H PRN Paliwal, Ria, MD   20 mg at 01/24/24 0831   HYDROcodone -acetaminophen  (NORCO/VICODIN) 5-325 MG per tablet 1 tablet  1 tablet  Oral Q6H PRN Samtani, Jai-Gurmukh, MD       hydrOXYzine  (VISTARIL ) injection 25 mg  25 mg Intramuscular Q6H PRN Paliwal, Aditya, MD   25 mg at 01/24/24 0328   ipratropium-albuterol  (DUONEB) 0.5-2.5 (3) MG/3ML nebulizer solution 3 mL  3 mL Nebulization Q4H PRN Ebbie Cough, MD       labetalol  (NORMODYNE ) injection 20 mg  20 mg Intravenous Q2H PRN Paliwal, Aditya, MD   20 mg at 01/24/24 0630   lactulose  (CHRONULAC ) 10 GM/15ML solution 20 g  20 g Oral Once Rai, Ripudeep K, MD       multivitamin with minerals tablet 1 tablet  1 tablet Oral Daily Chen, Lydia D, RPH   1 tablet at 02/18/24 0854   ondansetron  (ZOFRAN ) injection 4 mg  4 mg Intravenous Q6H PRN Ebbie Cough, MD   4 mg at 02/11/24 0730   Oral care mouth rinse  15 mL Mouth Rinse TID Cindy Garnette POUR, MD   15 mL at 02/18/24 9044   Oral care mouth rinse  15 mL Mouth Rinse PRN Rai, Ripudeep K, MD       pantoprazole  (PROTONIX ) EC tablet 40 mg  40 mg Oral BID Hammons, Suzen NOVAK,  RPH   40 mg at 02/18/24 0854   polyethylene glycol (MIRALAX  / GLYCOLAX ) packet 17 g  17 g Oral BID Rai, Ripudeep K, MD       senna-docusate (Senokot-S) tablet 1 tablet  1 tablet Oral BID Rai, Ripudeep K, MD       sodium chloride  flush (NS) 0.9 % injection 10-40 mL  10-40 mL Intracatheter PRN Sharie Bourbon, MD   10 mL at 02/11/24 0924   sucralfate  (CARAFATE ) tablet 1 g  1 g Oral TID WC & HS Cunningham, Scott E, MD   1 g at 02/18/24 1226   Allergies as of 01/16/2024   (No Known Allergies)   Review of Systems:  Review of Systems  Respiratory:  Negative for shortness of breath.   Cardiovascular:  Negative for chest pain.  Gastrointestinal:  Negative for abdominal pain.    OBJECTIVE:   Temp:  [97.4 F (36.3 C)-97.7 F (36.5 C)] 97.4 F (36.3 C) (01/18 1200) Pulse Rate:  [83-87] 87 (01/18 0752) Resp:  [17-20] 18 (01/18 0752) BP: (102-118)/(50-69) 118/69 (01/18 0752) SpO2:  [96 %-100 %] 98 % (01/18 0752) Last BM Date : 02/16/24 Physical  Exam Constitutional:      General: He is not in acute distress.    Appearance: He is not ill-appearing, toxic-appearing or diaphoretic.  Cardiovascular:     Rate and Rhythm: Normal rate and regular rhythm.  Pulmonary:     Effort: No respiratory distress.     Breath sounds: Normal breath sounds.     Comments: No supplemental oxygen use Abdominal:     General: Bowel sounds are normal. There is no distension.     Palpations: Abdomen is soft.     Tenderness: There is no abdominal tenderness. There is no guarding.  Neurological:     Mental Status: He is alert.     Labs: Recent Labs    02/16/24 0616 02/17/24 0557 02/18/24 0411  WBC 5.6 6.3 6.1  HGB 7.9* 8.1* 8.0*  HCT 23.8* 24.0* 23.8*  PLT 154 152 152   BMET No results for input(s): NA, K, CL, CO2, GLUCOSE, BUN, CREATININE, CALCIUM  in the last 72 hours. LFT No results for input(s): PROT, ALBUMIN, AST, ALT, ALKPHOS, BILITOT, BILIDIR, IBILI in the last 72 hours. PT/INR No results for input(s): LABPROT, INR in the last 72 hours. Diagnostic imaging: No results found.  IMPRESSION: Duodenal ulcer - EGD 09/09/2023 [facility] showed D1D2 ulcer straddling the D1/D2 1.5 cm  - IR GDA micro coil embolization 09/09/2023             - EGD 01/08/2024 [facility] showed D1 ulcer 6-8 mm              - EGD 01/17/24 [facility] showed duodenal ulcer             - Duodenotomy, ligation of GDA and branches 01/17/24             - EGD 02/05/24 [facility] diffuse moderately friable polypoid mucosa in D1             - EGD 02/11/24 [facility] suture material in duodenal bulb, non-bleeding D2 diverticulum Acute blood loss anemia Atrial fibrillation currently on heparin  (prior to admission on Eliquis ) History stage IV lung cancer COPD  PLAN: - Follow up bowel movement, Hgb stable - Continue PPI therapy twice per day  - Carafate  1 gm PO QID - Regular diet  - Resume Eliquis  today if BM  non-bloody   LOS: 32 days   Estefana Keas, [facility] Gastroenterology

## 2024-02-18 NOTE — Plan of Care (Signed)
" °  Problem: Health Behavior/Discharge Planning: Goal: Ability to manage health-related needs will improve Outcome: Progressing   Problem: Clinical Measurements: Goal: Ability to maintain clinical measurements within normal limits will improve Outcome: Progressing Goal: Will remain free from infection Outcome: Progressing Goal: Diagnostic test results will improve Outcome: Progressing Goal: Respiratory complications will improve Outcome: Progressing Goal: Cardiovascular complication will be avoided Outcome: Progressing   Problem: Activity: Goal: Risk for activity intolerance will decrease Outcome: Progressing   Problem: Nutrition: Goal: Adequate nutrition will be maintained Outcome: Progressing   Problem: Coping: Goal: Level of anxiety will decrease Outcome: Progressing   Problem: Elimination: Goal: Will not experience complications related to bowel motility Outcome: Progressing Goal: Will not experience complications related to urinary retention Outcome: Progressing   Problem: Pain Managment: Goal: General experience of comfort will improve and/or be controlled Outcome: Progressing   Problem: Safety: Goal: Ability to remain free from injury will improve Outcome: Progressing   Problem: Skin Integrity: Goal: Risk for impaired skin integrity will decrease Outcome: Progressing   Problem: Education: Goal: Ability to describe self-care measures that may prevent or decrease complications (Diabetes Survival Skills Education) will improve Outcome: Progressing Goal: Individualized Educational Video(s) Outcome: Progressing   Problem: Coping: Goal: Ability to adjust to condition or change in health will improve Outcome: Progressing   Problem: Fluid Volume: Goal: Ability to maintain a balanced intake and output will improve Outcome: Progressing   Problem: Health Behavior/Discharge Planning: Goal: Ability to identify and utilize available resources and services will  improve Outcome: Progressing Goal: Ability to manage health-related needs will improve Outcome: Progressing   Problem: Metabolic: Goal: Ability to maintain appropriate glucose levels will improve Outcome: Progressing   Problem: Nutritional: Goal: Maintenance of adequate nutrition will improve Outcome: Progressing Goal: Progress toward achieving an optimal weight will improve Outcome: Progressing   Problem: Skin Integrity: Goal: Risk for impaired skin integrity will decrease Outcome: Progressing   Problem: Tissue Perfusion: Goal: Adequacy of tissue perfusion will improve Outcome: Progressing   Problem: Education: Goal: Knowledge of General Education information will improve Description: Including pain rating scale, medication(s)/side effects and non-pharmacologic comfort measures Outcome: Progressing   Problem: Health Behavior/Discharge Planning: Goal: Ability to manage health-related needs will improve Outcome: Progressing   Problem: Clinical Measurements: Goal: Ability to maintain clinical measurements within normal limits will improve Outcome: Progressing Goal: Will remain free from infection Outcome: Progressing Goal: Diagnostic test results will improve Outcome: Progressing Goal: Respiratory complications will improve Outcome: Progressing Goal: Cardiovascular complication will be avoided Outcome: Progressing   Problem: Activity: Goal: Risk for activity intolerance will decrease Outcome: Progressing   Problem: Nutrition: Goal: Adequate nutrition will be maintained Outcome: Progressing   Problem: Coping: Goal: Level of anxiety will decrease Outcome: Progressing   Problem: Elimination: Goal: Will not experience complications related to bowel motility Outcome: Progressing Goal: Will not experience complications related to urinary retention Outcome: Progressing   Problem: Pain Managment: Goal: General experience of comfort will improve and/or be  controlled Outcome: Progressing   Problem: Safety: Goal: Ability to remain free from injury will improve Outcome: Progressing   Problem: Skin Integrity: Goal: Risk for impaired skin integrity will decrease Outcome: Progressing   "

## 2024-02-19 DIAGNOSIS — K264 Chronic or unspecified duodenal ulcer with hemorrhage: Secondary | ICD-10-CM | POA: Diagnosis not present

## 2024-02-19 DIAGNOSIS — I482 Chronic atrial fibrillation, unspecified: Secondary | ICD-10-CM | POA: Diagnosis not present

## 2024-02-19 DIAGNOSIS — G4733 Obstructive sleep apnea (adult) (pediatric): Secondary | ICD-10-CM | POA: Diagnosis not present

## 2024-02-19 DIAGNOSIS — D62 Acute posthemorrhagic anemia: Secondary | ICD-10-CM | POA: Diagnosis not present

## 2024-02-19 LAB — CBC
HCT: 25.1 % — ABNORMAL LOW (ref 39.0–52.0)
Hemoglobin: 8.2 g/dL — ABNORMAL LOW (ref 13.0–17.0)
MCH: 30.7 pg (ref 26.0–34.0)
MCHC: 32.7 g/dL (ref 30.0–36.0)
MCV: 94 fL (ref 80.0–100.0)
Platelets: 149 K/uL — ABNORMAL LOW (ref 150–400)
RBC: 2.67 MIL/uL — ABNORMAL LOW (ref 4.22–5.81)
RDW: 18.9 % — ABNORMAL HIGH (ref 11.5–15.5)
WBC: 6.1 K/uL (ref 4.0–10.5)
nRBC: 0 % (ref 0.0–0.2)

## 2024-02-19 LAB — HEPARIN LEVEL (UNFRACTIONATED): Heparin Unfractionated: 0.34 [IU]/mL (ref 0.30–0.70)

## 2024-02-19 LAB — GLUCOSE, CAPILLARY: Glucose-Capillary: 151 mg/dL — ABNORMAL HIGH (ref 70–99)

## 2024-02-19 MED ORDER — ORAL CARE MOUTH RINSE: 15.0000 mL | OROMUCOSAL | Status: DC | PRN

## 2024-02-19 MED ORDER — APIXABAN 5 MG PO TABS
5.0000 mg | ORAL_TABLET | Freq: Two times a day (BID) | ORAL | Status: DC
  Administered 2024-02-19 – 2024-02-21 (×5): 5 mg via ORAL
  Filled 2024-02-19 (×5): qty 1

## 2024-02-19 NOTE — Progress Notes (Signed)
 "          [name]                                                                              Raymond Hurst, is a 74 y.o. male, DOB - 1950/08/25, FMW:[id number] Admit date - 01/16/2024    Outpatient Primary MD for the patient is [name], MD  LOS - 33  days  Chief Complaint  Patient presents with   Hematemesis       Brief summary   Patient is a 74 year old male with Left lung mass bronchogenic CA (poorly differentiated squamous cell) diagnosed 06/05/2023-bronchoscopy 08/09/2023 complicated by left PNA thorax-follows with Dr. [name], A-fib on Eliquis , previous alcohol use, UGIB 09/09/2023 cratered ulcer duodenum status post mesenteric angio/embolization at [facility].  Readmission 12/6-12/8 with upper GI bleed.  EGD clean-based ulcer told to continue twice daily PPI manage resumed Eliquis  hemoglobin was 9.6 on discharge at that time   Significant Events: 12/16 [facility] ED upper GI bleed with bright red vomiting  12/17 : Transferred to [facility].  Status post duodenal bleed Clip by IR.  Continued ongoing bleeding requiring pressors, massive transfusion protocol, emergent ex lap Dr. Rubin with duodenotomy ligation GDA branches VAC placement 12/19 Secondary closure of abdomen Dr. Ebbie  12/26 barium swallow performed and upper GI 1226 without evidence of leak.  Prolonged hospital course with intermittent bleeding.  Repeat EGD on 1/5 showed no active bleeding or high risk bleeding lesions. EGD 1/5 showed no active bleeding or high risk bleeding lesions and the bleeding was suspected to be from friable mucosa in the duodenal bulb at the site of surgical repair after DOAC started  Heparin  was stopped, underwent repeat EGD 1/11 with no active bleeding. 1/12: Hemoglobin continues to trend down, symptomatic.  GI recommended observation, no Pete colonoscopy.  Wife frustrated and requested second GI opinion, accepted graciously by GI. Cardiology consulted. 1/13/26[name]  recommended continue heparin  drip resuming heparin  drip until no further GI bleeding before resuming Eliquis    Assessment & Plan    Duodenal ulcer hemorrhage with coiling 09/2023  s/p laparotomy 12/17, secondary closure 12/19 as above -Vomiting resolved since 12/26.  Off TPN on 12/31.  JP drain removed 12/31 - Now tolerating regular diet    Recurrent GI Bleed and Symptomatic Anemia, Duodenal ulcer - Prolonged hospitalization due to intermittent GI bleeding.  Has had 5 EGDs so far, last performed on 02/11/2024. -02/03/24 and 02/11/24 CTA GIB negative for active bleeding -EGD (1/5) showed no active bleeding or high risk bleeding lesions and the bleeding was suspected to be from friable mucosa in the duodenal bulb at the site of surgical repair after DOAC started -Heparin  gtt was stopped  - Repeat EGD 02/11/24: Normal esophagus with erythematous mucosa in the gastric body with intact suture and erythematous friable mucosa but no evidence of recent bleeding for this area.  Given nonbleeding duodenal diverticulum, felt that the source of his new recurrent bleeding is from another source and possibly a diverticulum - Continue PI twice daily, sucralfate  1 g 4 times daily - Anemia profile showed iron  57, ferritin 518, folate> 20, B12 904 - Hemoglobin stable 8.2, had a BM yesterday,  states no blood. - Will DC IV heparin  drip and resume Eliquis  today, observe for next 48 hours for stable H&H before discharging  Sepsis, Staph epi Bacteremia-MRSE with resistances Likely peritonitis - Bcx 12/21 postive for staph epi, blood cultures 12/26 showed no growth. - Completed course of antibiotics on 12/30 - RIJ CVC removed 02/02/24    AKI on admission, Metabolic Acidosis - Baseline Cr 0.7 - 0.9.  Likely worsened due to #1, hypotension, GI bleed. - Resolved, creatinine at baseline    Atrial Fibrillation:  - CHADVASC >4. - Cardiology consulted and had extensive discussion with patient and family.   - If H&H  remained stable, no further bleeding, then retrial eliquis .  Can consider Watchman procedure but will need to demonstrate tolerability for [facility] for 45 days post watchman then dual antiplatelet therapy followed by single antiplatelet therapy. - Transitioned to eliquis  today, follow H&H  Constipation - Continue bowel regimen   Right Cephalic Vein Acute SVT and Right Brachial Vein Acute DVT:  - Noted on RUE doppler on 12/29 -Transitioned to Eliquis    Hypernatremia:  - Resolved.   Hypokalemia:  - Replace as needed   Urinary Retention:  - Foley was initially removed and patient was voiding well.  - However, noted to be retaining again so Foley/indwelling cath placed and will be continue at discharge. Will need outpt f/u with Urology   Left Lung Mass Bronchogenic CA:  - Holding Immunotherapy/Chemotherapy. Will need Outpatient follow-up   Critical illness Thrombocytopenia: - Continue to follow    Moderate protein calorie malnutrition Nutrition Problem: Moderate Malnutrition Etiology: chronic illness (COPD, CVA, GIB, NSCLC) Signs/Symptoms: moderate fat depletion, severe muscle depletion, edema - Off TPN   Obesity class III  Estimated body mass index is 38.32 kg/m as calculated from the following:   Height as of this encounter: 6' 0.01 (1.829 m).   Weight as of this encounter: 128.2 kg.  Code Status: Full code DVT Prophylaxis:  Place TED hose Start: 02/15/24 1250 SCDs Start: 01/16/24 1731 apixaban  (ELIQUIS ) tablet 5 mg   Level of Care: Level of care: Telemetry Family Communication: Updated patient Disposition Plan:      Remains inpatient appropriate:      Procedures:    Consultants:   Cardiology General Surgery [facility] gastroenterology  Antimicrobials:   Anti-infectives (From admission, onward)    Start     Dose/Rate Route Frequency Ordered Stop   01/29/24 2200  fluconazole  (DIFLUCAN ) IVPB 400 mg  Status:  Discontinued        400 mg 100 mL/hr over 120 Minutes  Intravenous Every 24 hours 01/29/24 1336 01/29/24 1346   01/29/24 2200  fluconazole  (DIFLUCAN ) IVPB 400 mg  Status:  Discontinued        400 mg 200 mL/hr over 60 Minutes Intravenous Every 24 hours 01/29/24 1346 01/30/24 1533   01/25/24 1500  vancomycin  (VANCOREADY) IVPB 1500 mg/300 mL  Status:  Discontinued        1,500 mg 150 mL/hr over 120 Minutes Intravenous Every 24 hours 01/24/24 1322 01/30/24 1233   01/24/24 1115  vancomycin  (VANCOCIN ) 2,500 mg in sodium chloride  0.9 % 500 mL IVPB        2,500 mg 262.5 mL/hr over 120 Minutes Intravenous  Once 01/24/24 1029 01/24/24 1530   01/17/24 2245  piperacillin -tazobactam (ZOSYN ) IVPB 3.375 g  Status:  Discontinued        3.375 g 12.5 mL/hr over 240 Minutes Intravenous Every 8 hours 01/17/24 2149 01/30/24 1233   01/17/24 2200  fluconazole  (DIFLUCAN ) IVPB 400 mg  Status:  Discontinued        400 mg 100 mL/hr over 120 Minutes Intravenous Every 24 hours 01/17/24 2059 01/29/24 1336          Medications  apixaban   5 mg Oral BID   budesonide -glycopyrrolate -formoterol   2 puff Inhalation BID   carbamide peroxide  5 drop Right EAR BID   Chlorhexidine  Gluconate Cloth  6 each Topical Daily   feeding supplement (GLUCERNA SHAKE)  237 mL Oral TID BM   Gerhardt's butt cream   Topical BID   lactulose   20 g Oral Once   multivitamin with minerals  1 tablet Oral Daily   mouth rinse  15 mL Mouth Rinse TID   pantoprazole   40 mg Oral BID   senna-docusate  1 tablet Oral BID      Subjective:   Raymond Hurst was seen and examined today.  Per patient, had a BM yesterday evening, no bleeding.  H&H stable, feels comfortable transitioning to eliquis .  No nausea vomiting abdominal pain or any active bleeding.    Objective:   Vitals:   02/18/24 1200 02/18/24 1940 02/19/24 0530 02/19/24 0723  BP:  118/77 111/76 (!) 109/51  Pulse:  84 73 66  Resp:  17 17 18   Temp: (!) 97.4 F (36.3 C) 97.6 F (36.4 C) 97.7 F (36.5 C) 98.1 F (36.7 C)  TempSrc:  Oral  Oral   SpO2:  100% 98% 99%  Weight:      Height:        Intake/Output Summary (Last 24 hours) at 02/19/2024 1231 Last data filed at 02/19/2024 1138 Gross per 24 hour  Intake 646.23 ml  Output 2875 ml  Net -2228.77 ml     Wt Readings from Last 3 Encounters:  02/15/24 128.2 kg  01/12/24 119.7 kg  01/06/24 118.2 kg    Physical Exam General: Alert and oriented x 3, NAD Cardiovascular: S1 S2 clear, RRR.  Respiratory: CTAB, no wheezing Gastrointestinal: Soft, nontender, nondistended, NBS Ext: no pedal edema bilaterally Neuro: no new deficits Psych: Normal affect    Data Reviewed:  I have personally reviewed following labs    CBC Lab Results  Component Value Date   WBC 6.1 02/19/2024   RBC 2.67 (L) 02/19/2024   HGB 8.2 (L) 02/19/2024   HCT 25.1 (L) 02/19/2024   MCV 94.0 02/19/2024   MCH 30.7 02/19/2024   PLT 149 (L) 02/19/2024   MCHC 32.7 02/19/2024   RDW 18.9 (H) 02/19/2024   LYMPHSABS 0.7 02/14/2024   MONOABS 0.5 02/14/2024   EOSABS 0.2 02/14/2024   BASOSABS 0.0 02/14/2024     Last metabolic panel Lab Results  Component Value Date   NA 138 02/14/2024   K 3.5 02/14/2024   CL 105 02/14/2024   CO2 23 02/14/2024   BUN 22 02/14/2024   CREATININE 1.19 02/14/2024   GLUCOSE 96 02/14/2024   GFRNONAA >60 02/14/2024   GFRAA >60 06/13/2019   CALCIUM  8.9 02/14/2024   PHOS 3.2 02/14/2024   PROT 5.7 (L) 02/14/2024   ALBUMIN 3.3 (L) 02/14/2024   LABGLOB 3.2 06/01/2023   AGRATIO 1.3 07/21/2020   BILITOT 0.4 02/14/2024   ALKPHOS 64 02/14/2024   AST 17 02/14/2024   ALT 16 02/14/2024   ANIONGAP 11 02/14/2024    CBG (last 3)  No results for input(s): GLUCAP in the last 72 hours.    Coagulation Profile: No results for input(s): INR, PROTIME in the last 168 hours.  Radiology Studies: I have personally reviewed the imaging studies  No results found.     Nydia Distance M.D. [name] 02/19/2024, 12:31 PM  Available via Epic secure chat  7am-7pm After 7 pm, please refer to night coverage provider listed on amion.    "

## 2024-02-19 NOTE — Progress Notes (Signed)
 Physical Therapy Treatment Patient Details Name: Raymond Hurst MRN: [id number] DOB: 11-18-1950 Today's Date: 02/19/2024   History of Present Illness 74 y.o. male admitted to [facility] 01/16/24 with transfer to [facility] ICU on 12/17 with dizziness, hematemesis, blood loss anemia, hemorrhagic shock. EGD 12/17 with duodenal active bleed, clipped; same day emergent ex lap and wound vac placement. S/p abdominal closure 12/19. ETT 12/17-12/23. RUE SVT and DVT noted 12/29. Off TPN 12/31; JP drain removed 12/31. Of note, recent admission 01/06/24-01/08/24 with GIB. Other PMH includes afib on Eliquis , CVA, recurrent GIB, stage IV lung CA (dx 06/2023), gout, previous ETOH abuse.    PT Comments  Pt admitted with above diagnosis. Pt was able to ambulate with RW with overall good stability unless challenged. With challenges, needs min assist. Scored 14/24 on DGI suggesting pt at risk of falls without device.  Pt continues to need post acute rehab < 3 hours day to address balance and endurance deficits. Pt also states he needs to be able to ascend and descend 15 steps.  Pt currently with functional limitations due to the deficits listed below (see PT Problem List). Pt will benefit from acute skilled PT to increase their independence and safety with mobility to allow discharge.       If plan is discharge home, recommend the following: A little help with bathing/dressing/bathroom;Assist for transportation;Assistance with cooking/housework;A little help with walking and/or transfers   Can travel by private vehicle        Equipment Recommendations  BSC/3in1    Recommendations for Other Services       Precautions / Restrictions Precautions Precautions: Fall Recall of Precautions/Restrictions: Intact Restrictions Weight Bearing Restrictions Per Provider Order: No     Mobility  Bed Mobility Overal bed mobility: Modified Independent             General bed mobility comments: No physical assistance required to  come to EOB, reports dizziness (subsided in  ~1 minute)    Transfers Overall transfer level: Needs assistance Equipment used: Rolling walker (2 wheels) Transfers: Sit to/from Stand             General transfer comment: Verbal cues for proper hand placement on RW, CGA for sit to stand.    Ambulation/Gait Ambulation/Gait assistance: Min assist Gait Distance (Feet): 400 Feet Assistive device: Rolling walker (2 wheels) Gait Pattern/deviations: Step-through pattern, Decreased stride length, Trunk flexed, Drifts right/left, Staggering left, Staggering right Gait velocity: Decreased Gait velocity interpretation: <1.31 ft/sec, indicative of household ambulator   General Gait Details: Slow but generally steady with RW for support except when balance challenged, pt needing min assist.   Stairs Stairs: Yes Stairs assistance: Contact guard assist Stair Management: One rail Left, Step to pattern, Forwards, Backwards Number of Stairs: 2 General stair comments: ascend 2 steps forwards and descend 2 steps backwards with LUE rail support, CGA for safety/lines   Wheelchair Mobility     Tilt Bed    Modified Rankin (Stroke Patients Only)       Balance Overall balance assessment: Needs assistance Sitting-balance support: No upper extremity supported, Feet supported Sitting balance-Leahy Scale: Good Sitting balance - Comments: EOB   Standing balance support: Bilateral upper extremity supported, During functional activity, Reliant on assistive device for balance Standing balance-Leahy Scale: Poor Standing balance comment: Reliant on RW during dynamic standing tasks                 Standardized Balance Assessment Standardized Balance Assessment : Dynamic Gait Index   Dynamic  Gait Index Level Surface: Mild Impairment Change in Gait Speed: Mild Impairment Gait with Horizontal Head Turns: Mild Impairment Gait with Vertical Head Turns: Mild Impairment Gait and Pivot Turn:  Moderate Impairment Step Over Obstacle: Mild Impairment Step Around Obstacles: Mild Impairment Steps: Moderate Impairment Total Score: 14      Communication Communication Communication: Impaired Factors Affecting Communication: Hearing impaired  Cognition Arousal: Alert Behavior During Therapy: WFL for tasks assessed/performed   PT - Cognitive impairments: No apparent impairments                         Following commands: Intact      Cueing Cueing Techniques: Verbal cues  Exercises      General Comments General comments (skin integrity, edema, etc.): VSS      Pertinent Vitals/Pain Pain Assessment Pain Assessment: No/denies pain    Home Living                          Prior Function            PT Goals (current goals can now be found in the care plan section) Progress towards PT goals: Progressing toward goals    Frequency    Min 2X/week      PT Plan      Co-evaluation              AM-PAC PT 6 Clicks Mobility   Outcome Measure  Help needed turning from your back to your side while in a flat bed without using bedrails?: None Help needed moving from lying on your back to sitting on the side of a flat bed without using bedrails?: None Help needed moving to and from a bed to a chair (including a wheelchair)?: A Little Help needed standing up from a chair using your arms (e.g., wheelchair or bedside chair)?: A Little Help needed to walk in hospital room?: A Little Help needed climbing 3-5 steps with a railing? : A Little 6 Click Score: 20    End of Session Equipment Utilized During Treatment: Gait belt Activity Tolerance: Patient tolerated treatment well Patient left: in chair;with call bell/phone within reach;with chair alarm set Nurse Communication: Mobility status PT Visit Diagnosis: Unsteadiness on feet (R26.81);Other abnormalities of gait and mobility (R26.89);Muscle weakness (generalized) (M62.81)     Time:  8765-8748 PT Time Calculation (min) (ACUTE ONLY): 17 min  Charges:    $Gait Training: 8-22 mins PT General Charges $$ ACUTE PT VISIT: 1 Visit                     Geneal Huebert M,PT Acute Rehab Services [phone]    [name] 02/19/2024, 2:18 PM

## 2024-02-19 NOTE — Progress Notes (Addendum)
 PHARMACY - ANTICOAGULATION CONSULT NOTE  Pharmacy Consult for heparin   Indication: atrial fibrillation and chronic RUE DVT  Allergies[1]  Patient Measurements: Height: 6' 0.01 (182.9 cm) Weight: 128.2 kg (282 lb 10.1 oz) (Bed weight) IBW/kg (Calculated) : 77.62 HEPARIN  DW (KG): 103.8  Vital Signs: Temp: 97.7 F (36.5 C) (01/19 0530) Temp Source: Oral (01/19 0530) BP: 111/76 (01/19 0530) Pulse Rate: 73 (01/19 0530)  Labs: Recent Labs    02/17/24 0557 02/18/24 0411 02/19/24 0331  HGB 8.1* 8.0* 8.2*  HCT 24.0* 23.8* 25.1*  PLT 152 152 149*  HEPARINUNFRC 0.42 0.33 0.34    Estimated Creatinine Clearance: 77.6 mL/min (by C-G formula based on SCr of 1.19 mg/dL).   Assessment: 64 YOM w/ Hx AF with a RUE DVT in need of AC. Apixaban  PTA. Last dose was >7 days ago. He was admitted to [facility] w/ duodenal ulcer hemorrhage which was clipped on 12/17.   Resuming heparin , with plans to monitor Hgb for 3-5 days while on heparin  before resuming apixaban .  1/19 AM update: HL 0.34 Hgb 8.2- stable PLT 149K No signs of bleeding or pauses w/ gtt Patient had a BM evening 1/18 that was non-bloody  Goal of Therapy:  Heparin  level 0.3-0.5 units/mL Monitor platelets by anticoagulation protocol: Yes   Plan:  DC hep gtt restart Apixaban  5 mg po bid given patient has a stable Hgb and has had a BM that was non-bloody Monitor for bleeding    Benedetta Heath BS, PharmD, BCPS Clinical Pharmacist 02/19/2024 7:20 AM  Contact: [phone] after 3 PM     [1] No Known Allergies

## 2024-02-19 NOTE — Plan of Care (Signed)
   Problem: Health Behavior/Discharge Planning: Goal: Ability to manage health-related needs will improve Outcome: Progressing

## 2024-02-19 NOTE — Progress Notes (Signed)
 [facility] Progress Note  SUBJECTIVE:   Interval history: Raymond Hurst was seen and evaluated today at bedside. Resting in chair at bedside. Had brown bowel movement yesterday per nursing staff. Eliquis  resumed, hemoglobin stable. No BM today per patient. No abdominal pain, nausea, vomiting. No chest pain or shortness of breath.   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  He had nocturnal hypoxemia with O2 sat nadir of 70%. Now on CPAP   Pulmonary mass    Stroke [facility])    Past Surgical History:  Procedure Laterality Date   BRONCHIAL BIOPSY  06/12/2023   Procedure: BRONCHOSCOPY, WITH BIOPSY;  Surgeon: Shelah Lamar RAMAN, MD;  Location: [facility] ENDOSCOPY;  Service: Cardiopulmonary;;   BRONCHIAL BRUSHINGS  06/12/2023   Procedure: BRONCHOSCOPY, WITH BRUSH BIOPSY;  Surgeon: Shelah Lamar RAMAN, MD;  Location: [facility] ENDOSCOPY;  Service: Cardiopulmonary;;   BRONCHIAL NEEDLE ASPIRATION BIOPSY  06/12/2023   Procedure: BRONCHOSCOPY, WITH NEEDLE ASPIRATION BIOPSY;  Surgeon: Shelah Lamar RAMAN, MD;  Location: [facility] ENDOSCOPY;  Service: Cardiopulmonary;;   BRONCHIAL NEEDLE ASPIRATION BIOPSY  08/28/2023   Procedure: BRONCHOSCOPY, WITH NEEDLE ASPIRATION BIOPSY;  Surgeon: Shelah Lamar RAMAN, MD;  Location: [facility] ENDOSCOPY;  Service: Pulmonary;;   CRYOTHERAPY  08/28/2023   Procedure: CRYOTHERAPY;  Surgeon: Shelah Lamar RAMAN, MD;  Location: [facility] ENDOSCOPY;  Service: Pulmonary;;   ESOPHAGOGASTRODUODENOSCOPY N/A 09/09/2023   Procedure: EGD (ESOPHAGOGASTRODUODENOSCOPY);  Surgeon: Shaaron Lamar HERO, MD;   Location: [facility] ENDO SUITE;  Service: Endoscopy;  Laterality: N/A;   ESOPHAGOGASTRODUODENOSCOPY N/A 01/08/2024   Procedure: EGD (ESOPHAGOGASTRODUODENOSCOPY);  Surgeon: Cindie Carlin POUR, DO;  Location: [facility] ENDO SU[facility];  Service: Endoscopy;  Laterality: N/A;   ESOPHAGOGASTRODUODENOSCOPY N/A 01/17/2024   Procedure: EGD (ESOPHAGOGASTRODUODENOSCOPY);  Surgeon: Cindie Carlin POUR, DO;  Location: [facility] END[facility];  Service: Endoscopy;  Laterality: N/A;   ESOPHAGOGASTRODUODENOSCOPY N/A 02/05/2024   Procedure: EGD (ESOPHAGOGASTRODUODENOSCOPY);  Surgeon: Stacia Glendia BRAVO, MD;  Location: [facility] ENDOSCOPY;  Service: Gastroenterology;  Laterality: N/A;   ESOPHAGOGASTRODUODENOSCOPY N/A 02/11/2024   Procedure: EGD (ESOPHAGOGASTRODUODENOSCOPY);  Surgeon: Stacia Glendia BRAVO, MD;  Location: [facility] ENDOSCOPY;  Service: Gastroenterology;  Laterality: N/A;   HEMOSTASIS CLIP PLACEMENT  06/12/2023   Procedure: CONTROL OF HEMORRHAGE bronch cold saline and epi;  Surgeon: Shelah Lamar RAMAN, MD;  Location: [facility] ENDOSCOPY;  Service: Cardiopulmonary;;   HEMOSTASIS CLIP PLACEMENT  01/17/2024   Procedure: CONTROL OF HEMORRHAGE, GI TRACT, ENDOSCOPIC, BY CLIPPING OR OVERSEWING;  Surgeon: Cindie Carlin POUR, DO;  Location: [facility];  Service: Endoscopy;;   INNER EAR SURGERY     IR ANGIOGRAM SELECTIVE EACH ADDITIONAL VESSEL  09/09/2023   IR ANGIOGRAM VISCERAL SELECTIVE  09/09/2023   IR ANGIOGRAM VISCERAL SELECTIVE  09/09/2023   IR EMBO ART  VEN HEMORR LYMPH EXTRAV  INC GUIDE ROADMAPPING  09/09/2023   IR THORACENTESIS RIGHT ASP PLEURAL SPACE W/IMG GUIDE  09/26/2023   IR US  GUIDE VASC ACCESS RIGHT  09/09/2023   KNEE SURGERY  Left    LAPAROTOMY N/A 01/17/2024   Procedure: LAPAROTOMY, EXPLORATORY;  Surgeon: Rubin Calamity, MD;  Location: [facility] OR;  Service: General;  Laterality: N/A;   LAPAROTOMY N/A 01/19/2024   Procedure: LAPAROTOMY, EXPLORATORY;  Surgeon: Ebbie Cough, MD;  Location: [facility] OR;  Service: General;  Laterality: N/A;  POSSIBLE ABDOMINAL  CLOSURE   THORACENTESIS Left 06/06/2023   Procedure: THORACENTESIS;  Surgeon: Arlinda Ranks, MD;  Location: [facility] ENDOSCOPY;  Service: Pulmonary;  Laterality: Left;   TONSILLECTOMY     VIDEO BRONCHOSCOPY WITH ENDOBRONCHIAL NAVIGATION Left 08/28/2023   Procedure: VIDEO BRONCHOSCOPY WITH ENDOBRONCHIAL NAVIGATION;  Surgeon: Shelah Lamar RAMAN, MD;  Location: [facility] ENDOSCOPY;  Service: Pulmonary;  Laterality: Left;   VIDEO BRONCHOSCOPY WITH ENDOBRONCHIAL ULTRASOUND Left 06/12/2023   Procedure: BRONCHOSCOPY, WITH EBUS;  Surgeon: Shelah Lamar RAMAN, MD;  Location: [facility] ENDOSCOPY;  Service: Cardiopulmonary;  Laterality: Left;   VIDEO BRONCHOSCOPY WITH ENDOBRONCHIAL ULTRASOUND  08/28/2023   Procedure: BRONCHOSCOPY, WITH EBUS;  Surgeon: Shelah Lamar RAMAN, MD;  Location: [facility] ENDOSCOPY;  Service: Pulmonary;;   Current Facility-Administered Medications  Medication Dose Route Frequency Provider Last Rate Last Admin   acetaminophen  (TYLENOL ) suppository 650 mg  650 mg Rectal Q6H PRN Sharie Bourbon, MD       apixaban  (ELIQUIS ) tablet 5 mg  5 mg Oral BID Reome, Earle J, RPH   5 mg at 02/19/24 1018   budesonide -glycopyrrolate -formoterol  (BREZTRI ) 160-9-4.8 MCG/ACT inhaler 2 puff  2 puff Inhalation BID Samtani, Jai-Gurmukh, MD   2 puff at 02/19/24 0851   carbamide peroxide (DEBROX) 6.5 % OTIC (EAR) solution 5 drop  5 drop Right EAR BID Sherrill Cable [place], DO   5 drop at 02/18/24 9045   Chlorhexidine  Gluconate Cloth 2 % PADS 6 each  6 each Topical Daily Ebbie Cough, MD   6 each at 02/19/24 0857   feeding supplement (GLUCERNA SHAKE) (GLUCERNA SHAKE) liquid 237 mL  237 mL Oral TID BM Rai, Ripudeep K, MD   237 mL at 02/19/24 0856   Gerhardt's butt cream   Topical BID Cindy Garnette POUR, MD   Given at 02/19/24 0856   hydrALAZINE  (APRESOLINE ) injection 20 mg  20 mg Intravenous Q4H PRN Paliwal, Ria, MD   20 mg at 01/24/24 0831   HYDROcodone -acetaminophen  (NORCO/VICODIN) 5-325 MG per tablet 1 tablet  1 tablet Oral Q6H PRN  Samtani, Jai-Gurmukh, MD       hydrOXYzine  (VISTARIL ) injection 25 mg  25 mg Intramuscular Q6H PRN Paliwal, Aditya, MD   25 mg at 01/24/24 0328   ipratropium-albuterol  (DUONEB) 0.5-2.5 (3) MG/3ML nebulizer solution 3 mL  3 mL Nebulization Q4H PRN Ebbie Cough, MD       labetalol  (NORMODYNE ) injection 20 mg  20 mg Intravenous Q2H PRN Paliwal, Aditya, MD   20 mg at 01/24/24 0630   lactulose  (CHRONULAC ) 10 GM/15ML solution 20 g  20 g Oral Once Rai, Ripudeep K, MD       multivitamin with minerals tablet 1 tablet  1 tablet Oral Daily Chen, Lydia D, RPH   1 tablet at 02/19/24 0856   ondansetron  (ZOFRAN ) injection 4 mg  4 mg Intravenous Q6H PRN Ebbie Cough, MD   4 mg at 02/11/24 0730   Oral care mouth rinse  15 mL Mouth Rinse TID Cindy Garnette POUR, MD   15 mL at 02/19/24 0856   Oral care mouth rinse  15 mL Mouth Rinse PRN Rai, Ripudeep K, MD       pantoprazole  (PROTONIX ) EC tablet 40 mg  40  mg Oral BID Hammons, Suzen NOVAK, RPH   40 mg at 02/19/24 0856   senna-docusate (Senokot-S) tablet 1 tablet  1 tablet Oral BID Rai, Ripudeep K, MD   1 tablet at 02/19/24 0856   sodium chloride  flush (NS) 0.9 % injection 10-40 mL  10-40 mL Intracatheter PRN Sharie Bourbon, MD   10 mL at 02/11/24 9075   Allergies as of 01/16/2024   (No Known Allergies)   Review of Systems:  Review of Systems  Respiratory:  Negative for shortness of breath.   Cardiovascular:  Negative for chest pain.  Gastrointestinal:  Negative for abdominal pain, nausea and vomiting.    OBJECTIVE:   Temp:  [97.6 F (36.4 C)-98.1 F (36.7 C)] 98.1 F (36.7 C) (01/19 0723) Pulse Rate:  [66-84] 66 (01/19 0723) Resp:  [17-18] 18 (01/19 0723) BP: (109-118)/(51-77) 109/51 (01/19 0723) SpO2:  [98 %-100 %] 99 % (01/19 0723) Last BM Date : 02/18/24 Physical Exam Constitutional:      General: He is not in acute distress.    Appearance: He is not ill-appearing, toxic-appearing or diaphoretic.  Cardiovascular:     Rate and Rhythm:  Normal rate and regular rhythm.  Pulmonary:     Effort: No respiratory distress.     Breath sounds: Normal breath sounds.  Abdominal:     General: Bowel sounds are normal. There is no distension.     Palpations: Abdomen is soft.     Tenderness: There is no abdominal tenderness.  Neurological:     Mental Status: He is alert.     Labs: Recent Labs    02/17/24 0557 02/18/24 0411 02/19/24 0331  WBC 6.3 6.1 6.1  HGB 8.1* 8.0* 8.2*  HCT 24.0* 23.8* 25.1*  PLT 152 152 149*   BMET No results for input(s): NA, K, CL, CO2, GLUCOSE, BUN, CREATININE, CALCIUM  in the last 72 hours. LFT No results for input(s): PROT, ALBUMIN, AST, ALT, ALKPHOS, BILITOT, BILIDIR, IBILI in the last 72 hours. PT/INR No results for input(s): LABPROT, INR in the last 72 hours. Diagnostic imaging: No results found.  IMPRESSION: Duodenal ulcer - EGD 09/09/2023 [facility] showed D1D2 ulcer straddling the D1/D2 1.5 cm  - IR GDA micro coil embolization 09/09/2023             - EGD 01/08/2024 [facility] showed D1 ulcer 6-8 mm              - EGD 01/17/24 [facility] showed duodenal ulcer             - Duodenotomy, ligation of GDA and branches 01/17/24             - EGD 02/05/24 [facility] diffuse moderately friable polypoid mucosa in D1             - EGD 02/11/24 [facility] suture material in duodenal bulb, non-bleeding D2 diverticulum Acute blood loss anemia Atrial fibrillation currently on heparin  (prior to admission on Eliquis ) History stage IV lung cancer COPD  PLAN: - Continue PPI therapy indefinitely  - Trend H/H, transfuse for Hgb < 7  - Monitor bowel movements - [facility] will be available as needed    LOS: 33 days   Estefana Keas, [facility]

## 2024-02-19 NOTE — TOC Progression Note (Signed)
 Transition of Care [facility]) - Progression Note    Patient Details  Name: Raymond Hurst MRN: [id number] Date of Birth: [birth year]  Transition of Care [facility]) CM/SW Contact  Lendia Dais, [place] Phone Number: 02/19/2024, 12:56 PM  Clinical Narrative: Initial plan was for the pt to go to [facility]. Allean of [facility] had questions regarding to the pt's procedure on January 11th and requested for the H&P to be updated to reflect the Procedure. CSW informed Kristin that the H&P is done when the pt is first admitted and cannot be altered. CSW stated that the updated progress notes reflecting the pt's procedure and accessible in epic.  [facility] rescinded their bed offer and stated they are not able to meet the pt's needs and have no beds.  CSW reached out to other nursing facilities to search for available beds. Per MD pt will be medically ready Wednesday.  CSW will continue to follow.       Barriers to Discharge: Continued Medical Work up               Expected Discharge Plan and Services                                               Social Drivers of Health (SDOH) Interventions SDOH Screenings   Food Insecurity: No Food Insecurity (01/16/2024)  Housing: Low Risk (01/26/2024)  Transportation Needs: No Transportation Needs (01/16/2024)  Utilities: Not At Risk (01/16/2024)  Alcohol Screen: Low Risk (06/15/2023)  Depression (PHQ2-9): Low Risk (01/12/2024)  Financial Resource Strain: High Risk (06/15/2023)  Physical Activity: Inactive (06/15/2023)  Social Connections: Moderately Isolated (01/16/2024)  Stress: No Stress Concern Present (06/15/2023)  Tobacco Use: Medium Risk (02/11/2024)    Readmission Risk Interventions    09/08/2023   12:09 PM  Readmission Risk Prevention Plan  Transportation Screening Complete  HRI or Home Care Consult Complete  Social Work Consult for Recovery Care Planning/Counseling Complete  Palliative Care Screening Not  Applicable  Medication Review Oceanographer) Complete

## 2024-02-19 NOTE — [place] FL2 (Signed)
 " [place]  MEDICAID FL2 LEVEL OF CARE FORM     IDENTIFICATION  Patient Name: Raymond Hurst Birthdate: Jul 18, 1950 Sex: male Admission Date (Current Location): 01/16/2024  [facility] and [place] Number:  Producer, Television/film/video and Address:  [facility], [address], [place], [place] [address]      Provider Number: [id number]  Attending Physician Name and Address:  Davia Nydia POUR, MD  Relative Name and Phone Number:       Current Level of Care: Hospital Recommended Level of Care: Skilled Nursing Facility Prior Approval Number:    Date Approved/Denied:   PASRR Number: [id number]  Discharge Plan: SNF    Current Diagnoses: Patient Active Problem List   Diagnosis Date Noted   Duodenal ulcer with hemorrhage 02/05/2024   Deep vein thrombosis (DVT) of upper extremity (HCC) 02/05/2024   At risk for injury associated with anticoagulation 02/05/2024   Malnutrition of moderate degree 01/23/2024   Hemorrhagic shock (HCC) 01/18/2024   Hypotension due to hypovolemia 01/17/2024   Upper GI bleeding 01/16/2024   Melena 01/08/2024   Class II obesity 01/08/2024   Atrial fibrillation, chronic (HCC) 01/07/2024   OSA (obstructive sleep apnea) 01/07/2024   Chronic obstructive pulmonary disease (COPD) (HCC) 01/07/2024   Acute bronchitis 01/07/2024   GI bleeding 01/06/2024   GI bleed 09/07/2023   Acute on chronic anemia 09/07/2023   Pneumothorax after biopsy 08/28/2023   Metastasis to left adrenal gland of unknown origin (HCC) 07/05/2023   Lung cancer, upper lobe (HCC) 06/27/2023   Acute hypoxic respiratory failure (HCC) 06/13/2023   Atrial fibrillation with rapid ventricular response (HCC) 06/13/2023   Lung mass 06/05/2023   Dysphonia 06/05/2023   Gout 07/22/2022   Right adrenal mass 07/22/2022   Diverticulosis of intestine with bleeding 07/22/2022   Acute blood loss anemia 07/22/2022   Lower GI bleeding 07/21/2022   OSA on CPAP 05/10/2022   Alcoholism  (HCC) 01/14/2021   Cardiac arrhythmia 01/14/2021   Hearing loss of left ear 01/14/2021   Long term (current) use of anticoagulants 01/14/2021   Lymphedema 01/14/2021   Neurocognitive disorder 01/14/2021   Tinnitus of right ear 01/14/2021   Vitamin B12 deficiency (non anemic) 01/14/2021   Vitamin D  deficiency 01/14/2021   DOE (dyspnea on exertion) 03/25/2020   Encounter for removal of sutures 12/24/2019   Dizziness 06/08/2018   TIA (transient ischemic attack) 06/08/2018   Paroxysmal atrial fibrillation (HCC) 06/08/2018   Near syncope 06/07/2018   Hypokalemia 06/07/2018   Chronic suppurative otitis media of left ear 04/04/2018   Presbycusis of both ears 04/04/2018   Unilateral deafness, left 04/04/2018   Subjective tinnitus of both ears 06/02/2015   Lymphedema of leg 04/08/2013   Cellulitis of right lower leg 04/04/2013   Morbid obesity due to excess calories (HCC) 04/04/2013   Alcohol abuse 04/04/2013   Cellulitis and abscess of leg 04/04/2013    Orientation RESPIRATION BLADDER Height & Weight     Self, Time, Situation, Place  O2 Incontinent Weight: 282 lb 10.1 oz (128.2 kg) (Bed weight) Height:  6' 0.01 (182.9 cm)  BEHAVIORAL SYMPTOMS/MOOD NEUROLOGICAL BOWEL NUTRITION STATUS      Incontinent Diet (see dc summary)  AMBULATORY STATUS COMMUNICATION OF NEEDS Skin   Extensive Assist Verbally Skin abrasions (redness)                       Personal Care Assistance Level of Assistance  Bathing, Feeding, Dressing Bathing Assistance: Maximum assistance (total  assist per OT) Feeding assistance: Independent Dressing Assistance: Maximum assistance (Total assist per OT)     Functional Limitations Info  Sight, Hearing, Speech Sight Info: Adequate Hearing Info: Impaired Speech Info: Adequate    SPECIAL CARE FACTORS FREQUENCY  PT (By licensed PT), OT (By licensed OT)     PT Frequency: 5x/wk OT Frequency: 5x/wk            Contractures Contractures Info: Not present     Additional Factors Info  Code Status, Allergies Code Status Info: FULL Allergies Info: NKA           Current Medications (02/19/2024):  This is the current hospital active medication list Current Facility-Administered Medications  Medication Dose Route Frequency Provider Last Rate Last Admin   acetaminophen  (TYLENOL ) suppository 650 mg  650 mg Rectal Q6H PRN Sharie Bourbon, MD       apixaban  (ELIQUIS ) tablet 5 mg  5 mg Oral BID Reome, Earle J, RPH   5 mg at 02/19/24 1018   budesonide -glycopyrrolate -formoterol  (BREZTRI ) 160-9-4.8 MCG/ACT inhaler 2 puff  2 puff Inhalation BID Samtani, Jai-Gurmukh, MD   2 puff at 02/19/24 0851   carbamide peroxide (DEBROX) 6.5 % OTIC (EAR) solution 5 drop  5 drop Right EAR BID Sherrill Cable [place], DO   5 drop at 02/18/24 9045   Chlorhexidine  Gluconate Cloth 2 % PADS 6 each  6 each Topical Daily Ebbie Cough, MD   6 each at 02/19/24 0857   feeding supplement (GLUCERNA SHAKE) (GLUCERNA SHAKE) liquid 237 mL  237 mL Oral TID BM Rai, Ripudeep K, MD   237 mL at 02/19/24 9143   Gerhardt's butt cream   Topical BID Cindy Garnette POUR, MD   Given at 02/19/24 [phone]   hydrALAZINE  (APRESOLINE ) injection 20 mg  20 mg Intravenous Q4H PRN Haze Led, MD   20 mg at 01/24/24 0831   HYDROcodone -acetaminophen  (NORCO/VICODIN) 5-325 MG per tablet 1 tablet  1 tablet Oral Q6H PRN Samtani, Jai-Gurmukh, MD       hydrOXYzine  (VISTARIL ) injection 25 mg  25 mg Intramuscular Q6H PRN Paliwal, Aditya, MD   25 mg at 01/24/24 0328   ipratropium-albuterol  (DUONEB) 0.5-2.5 (3) MG/3ML nebulizer solution 3 mL  3 mL Nebulization Q4H PRN Ebbie Cough, MD       labetalol  (NORMODYNE ) injection 20 mg  20 mg Intravenous Q2H PRN Paliwal, Aditya, MD   20 mg at 01/24/24 0630   lactulose  (CHRONULAC ) 10 GM/15ML solution 20 g  20 g Oral Once Rai, Ripudeep K, MD       multivitamin with minerals tablet 1 tablet  1 tablet Oral Daily Chen, Lydia D, RPH   1 tablet at 02/19/24 9143    ondansetron  (ZOFRAN ) injection 4 mg  4 mg Intravenous Q6H PRN Ebbie Cough, MD   4 mg at 02/11/24 0730   Oral care mouth rinse  15 mL Mouth Rinse TID Cindy Garnette POUR, MD   15 mL at 02/19/24 0856   Oral care mouth rinse  15 mL Mouth Rinse PRN Rai, Ripudeep K, MD       pantoprazole  (PROTONIX ) EC tablet 40 mg  40 mg Oral BID Hammons, Kimberly B, RPH   40 mg at 02/19/24 0856   senna-docusate (Senokot-S) tablet 1 tablet  1 tablet Oral BID Rai, Ripudeep K, MD   1 tablet at 02/19/24 0856   sodium chloride  flush (NS) 0.9 % injection 10-40 mL  10-40 mL Intracatheter PRN Sharie Bourbon, MD   10 mL at 02/11/24 [phone]  Discharge Medications: Please see discharge summary for a list of discharge medications.  Relevant Imaging Results:  Relevant Lab Results:   Additional Information SSN: [id number]  Lendia Dais, LCSWA     "

## 2024-02-19 NOTE — TOC Progression Note (Addendum)
 Transition of Care [facility]) - Progression Note    Patient Details  Name: Raymond Hurst MRN: [id number] Date of Birth: 05/08/1950  Transition of Care [facility]) CM/SW Contact  Lendia Dais, [place] Phone Number: 02/19/2024, 2:57 PM  Clinical Narrative:  CSW spoke to pt's wife Raymond Hurst to give an update of [facility] rescinding their bed offer. Raymond Hurst stated that her next choice would be [facility]. CSW stated they are pending a decision and have reached out other facilities as well.  CSW informed Raymond Hurst that per PT eval travel by ambulance is not medically necessary. CSW stated if they are unable to provide transportation the pt could go by safe transport. CSW stated they would be escorted in the wheelchair here and at the facility.  CSW reached out to [facility] who stated they have no beds available, CSW attempted to contact [place] [facility] and the CSW left a VM. CSW spoke to Tanya of [facility] and stated they would be able to offer on the pt and may be able to take Wednesday.  CSW is pending responses from facilities.       Barriers to Discharge: Continued Medical Work up               Expected Discharge Plan and Services                                               Social Drivers of Health (SDOH) Interventions SDOH Screenings   Food Insecurity: No Food Insecurity (01/16/2024)  Housing: Low Risk (01/26/2024)  Transportation Needs: No Transportation Needs (01/16/2024)  Utilities: Not At Risk (01/16/2024)  Alcohol Screen: Low Risk (06/15/2023)  Depression (PHQ2-9): Low Risk (01/12/2024)  Financial Resource Strain: High Risk (06/15/2023)  Physical Activity: Inactive (06/15/2023)  Social Connections: Moderately Isolated (01/16/2024)  Stress: No Stress Concern Present (06/15/2023)  Tobacco Use: Medium Risk (02/11/2024)    Readmission Risk Interventions    09/08/2023   12:09 PM  Readmission Risk Prevention Plan  Transportation Screening Complete  HRI or Home  Care Consult Complete  Social Work Consult for Recovery Care Planning/Counseling Complete  Palliative Care Screening Not Applicable  Medication Review Oceanographer) Complete

## 2024-02-20 DIAGNOSIS — K264 Chronic or unspecified duodenal ulcer with hemorrhage: Secondary | ICD-10-CM | POA: Diagnosis not present

## 2024-02-20 DIAGNOSIS — I482 Chronic atrial fibrillation, unspecified: Secondary | ICD-10-CM | POA: Diagnosis not present

## 2024-02-20 DIAGNOSIS — G4733 Obstructive sleep apnea (adult) (pediatric): Secondary | ICD-10-CM | POA: Diagnosis not present

## 2024-02-20 DIAGNOSIS — D62 Acute posthemorrhagic anemia: Secondary | ICD-10-CM | POA: Diagnosis not present

## 2024-02-20 LAB — BASIC METABOLIC PANEL WITH GFR
Anion gap: 10 (ref 5–15)
BUN: 19 mg/dL (ref 8–23)
CO2: 24 mmol/L (ref 22–32)
Calcium: 9.3 mg/dL (ref 8.9–10.3)
Chloride: 103 mmol/L (ref 98–111)
Creatinine, Ser: 1.29 mg/dL — ABNORMAL HIGH (ref 0.61–1.24)
GFR, Estimated: 59 mL/min — ABNORMAL LOW
Glucose, Bld: 95 mg/dL (ref 70–99)
Potassium: 4.2 mmol/L (ref 3.5–5.1)
Sodium: 137 mmol/L (ref 135–145)

## 2024-02-20 LAB — CBC
HCT: 25.8 % — ABNORMAL LOW (ref 39.0–52.0)
Hemoglobin: 8.5 g/dL — ABNORMAL LOW (ref 13.0–17.0)
MCH: 31.3 pg (ref 26.0–34.0)
MCHC: 32.9 g/dL (ref 30.0–36.0)
MCV: 94.9 fL (ref 80.0–100.0)
Platelets: 148 K/uL — ABNORMAL LOW (ref 150–400)
RBC: 2.72 MIL/uL — ABNORMAL LOW (ref 4.22–5.81)
RDW: 19 % — ABNORMAL HIGH (ref 11.5–15.5)
WBC: 6 K/uL (ref 4.0–10.5)
nRBC: 0 % (ref 0.0–0.2)

## 2024-02-20 NOTE — Plan of Care (Signed)
  Problem: Clinical Measurements: Goal: Ability to maintain clinical measurements within normal limits will improve Outcome: Completed/Met Goal: Respiratory complications will improve Outcome: Completed/Met

## 2024-02-20 NOTE — Progress Notes (Signed)
 Occupational Therapy Treatment Patient Details Name: Raymond Hurst MRN: [id number] DOB: May 29, 1950 Today's Date: 02/20/2024   History of present illness 74 y.o. male admitted to [facility] 01/16/24 with transfer to [facility] ICU on 12/17 with dizziness, hematemesis, blood loss anemia, hemorrhagic shock. EGD 12/17 with duodenal active bleed, clipped; same day emergent ex lap and wound vac placement. S/p abdominal closure 12/19. ETT 12/17-12/23. RUE SVT and DVT noted 12/29. Off TPN 12/31; JP drain removed 12/31. Of note, recent admission 01/06/24-01/08/24 with GIB. Other PMH includes afib on Eliquis , CVA, recurrent GIB, stage IV lung CA (dx 06/2023), gout, previous ETOH abuse.   OT comments  Pt progressing well towards OT goals. Focus of session on addressing RUE pain and decreased coordination and sensation of R digits. Exercise details as noted below, Pt with no contraindications to exercises this session. Pt required CGA for functional transfer to recliner and continues to require increased assistance for LB ADLs. OT to continue to follow Pt acutely, continue per POC.       If plan is discharge home, recommend the following:  A lot of help with bathing/dressing/bathroom;A little help with walking and/or transfers;Assist for transportation;Help with stairs or ramp for entrance   Equipment Recommendations  BSC/3in1;Wheelchair cushion (measurements OT);Tub/shower bench    Recommendations for Other Services      Precautions / Restrictions Precautions Precautions: Fall Recall of Precautions/Restrictions: Intact Restrictions Weight Bearing Restrictions Per Provider Order: No       Mobility Bed Mobility Overal bed mobility: Modified Independent             General bed mobility comments: No physical assistance required to come to EOB, no c/o dizziness with bed mobility.    Transfers Overall transfer level: Needs assistance Equipment used: Rolling walker (2 wheels) Transfers: Sit to/from Stand,  Bed to chair/wheelchair/BSC Sit to Stand: Contact guard assist     Step pivot transfers: Contact guard assist     General transfer comment: CGA for sit to stand with RW, step pivot to recliner on R side.     Balance Overall balance assessment: Needs assistance Sitting-balance support: No upper extremity supported, Feet supported Sitting balance-Leahy Scale: Good Sitting balance - Comments: EOB   Standing balance support: Bilateral upper extremity supported, During functional activity, Reliant on assistive device for balance Standing balance-Leahy Scale: Poor Standing balance comment: Dependent on RW and CGA                           ADL either performed or assessed with clinical judgement   ADL Overall ADL's : Needs assistance/impaired                     Lower Body Dressing: Total assistance;Sitting/lateral leans Lower Body Dressing Details (indicate cue type and reason): Total A LB socks                    Extremity/Trunk Assessment Upper Extremity Assessment Upper Extremity Assessment: Generalized weakness;RUE deficits/detail RUE Deficits / Details: Increased pain with shoulder ROM and compensatory strategy of L lateral lean noted when shoulder flexion above 90 degrees. Pt endorses athritis in shoulder at baseline. Decreased FM coordination and impaired sensation in digits. RUE: Shoulder pain with ROM RUE Sensation: decreased proprioception RUE Coordination: decreased fine motor;decreased gross motor            Vision       Perception     Praxis  Communication Communication Communication: Impaired Factors Affecting Communication: Hearing impaired   Cognition Arousal: Alert Behavior During Therapy: WFL for tasks assessed/performed Cognition: No apparent impairments             OT - Cognition Comments: Increased time to follow commands but overall WFL                 Following commands: Intact        Cueing    Cueing Techniques: Verbal cues, Visual cues  Exercises Exercises: General Upper Extremity General Exercises - Upper Extremity Shoulder Flexion: AAROM, 5 reps, Right Shoulder ABduction: AAROM, Right, 5 reps Shoulder ADduction: AAROM, Right, 5 reps Elbow Flexion: AAROM, Right, 5 reps Elbow Extension: AAROM, Right, 5 reps Digit Composite Flexion: AROM, Right, 5 reps Composite Extension: AROM, Right, 5 reps    Shoulder Instructions       General Comments Addressed Pt c/o RUE pain. Noted decreased sensation in RUE digits.    Pertinent Vitals/ Pain       Pain Assessment Pain Assessment: Faces Faces Pain Scale: Hurts even more Pain Location: R shoulder with movement Pain Descriptors / Indicators: Discomfort, Grimacing, Guarding, Tightness Pain Intervention(s): Limited activity within patient's tolerance, Monitored during session, Repositioned  Home Living                                          Prior Functioning/Environment              Frequency  Min 2X/week        Progress Toward Goals  OT Goals(current goals can now be found in the care plan section)  Progress towards OT goals: Progressing toward goals     Plan      Co-evaluation                 AM-PAC OT 6 Clicks Daily Activity     Outcome Measure   Help from another person eating meals?: None Help from another person taking care of personal grooming?: A Little Help from another person toileting, which includes using toliet, bedpan, or urinal?: A Little Help from another person bathing (including washing, rinsing, drying)?: A Lot Help from another person to put on and taking off regular upper body clothing?: A Little Help from another person to put on and taking off regular lower body clothing?: A Lot 6 Click Score: 17    End of Session Equipment Utilized During Treatment: Rolling walker (2 wheels);Gait belt  OT Visit Diagnosis: Muscle weakness (generalized) (M62.81)    Activity Tolerance Patient tolerated treatment well   Patient Left in chair;with call bell/phone within reach;with chair alarm set   Nurse Communication          Time: 8663-8646 OT Time Calculation (min): 17 min  Charges: OT General Charges $OT Visit: 1 Visit OT Treatments $Therapeutic Activity: 8-22 mins  Maurilio CROME, OTR/L.  [facility] Acute Rehabilitation  Office: [phone]   Maurilio PARAS [place] 02/20/2024, 4:08 PM

## 2024-02-20 NOTE — TOC Progression Note (Signed)
 Transition of Care [facility]) - Progression Note    Patient Details  Name: Raymond Hurst MRN: [id number] Date of Birth: 09/17/1950  [facility]) CM/SW Contact  Lendia Dais, [place] Phone Number: 02/20/2024, 11:09 AM  Clinical Narrative: CSW spoke to Tanya of [facility] who stated they can take the pt Wednesday. CSW attempted to contact pt's spouse and left a VM.  CSW will continue to follow.        Barriers to Discharge: Continued Medical Work up               Expected Discharge Plan and Services                                               Social Drivers of Health (SDOH) Interventions SDOH Screenings   Food Insecurity: No Food Insecurity (01/16/2024)  Housing: Low Risk (01/26/2024)  Transportation Needs: No Transportation Needs (01/16/2024)  Utilities: Not At Risk (01/16/2024)  Alcohol Screen: Low Risk (06/15/2023)  Depression (PHQ2-9): Low Risk (01/12/2024)  Financial Resource Strain: High Risk (06/15/2023)  Physical Activity: Inactive (06/15/2023)  Social Connections: Moderately Isolated (01/16/2024)  Stress: No Stress Concern Present (06/15/2023)  Tobacco Use: Medium Risk (02/11/2024)    Readmission Risk Interventions    09/08/2023   12:09 PM  Readmission Risk Prevention Plan  Transportation Screening Complete  HRI or Home Care Consult Complete  Social Work Consult for Recovery Care Planning/Counseling Complete  Palliative Care Screening Not Applicable  Medication Review Oceanographer) Complete

## 2024-02-20 NOTE — Progress Notes (Signed)
 "          [name]                                                                              Raymond Hurst, is a 74 y.o. male, DOB - 10-Aug-1950, FMW:[id number] Admit date - 01/16/2024    Outpatient Primary MD for the patient is [name], MD  LOS - 34  days  Chief Complaint  Patient presents with   Hematemesis       Brief summary   Patient is a 74 year old male with Left lung mass bronchogenic CA (poorly differentiated squamous cell) diagnosed 06/05/2023-bronchoscopy 08/09/2023 complicated by left PNA thorax-follows with Dr. [name], A-fib on Eliquis , previous alcohol use, UGIB 09/09/2023 cratered ulcer duodenum status post mesenteric angio/embolization at [facility].  Readmission 12/6-12/8 with upper GI bleed.  EGD clean-based ulcer told to continue twice daily PPI manage resumed Eliquis  hemoglobin was 9.6 on discharge at that time   Significant Events: 12/16 [facility] ED upper GI bleed with bright red vomiting  12/17 : Transferred to [facility]  Status post duodenal bleed Clip by IR.  Continued ongoing bleeding requiring pressors, massive transfusion protocol, emergent ex lap Dr. Rubin with duodenotomy ligation GDA branches VAC placement 12/19 Secondary closure of abdomen Dr. Ebbie  12/26 barium swallow performed and upper GI 1226 without evidence of leak.  Prolonged hospital course with intermittent bleeding.  Repeat EGD on 1/5 showed no active bleeding or high risk bleeding lesions. EGD 1/5 showed no active bleeding or high risk bleeding lesions and the bleeding was suspected to be from friable mucosa in the duodenal bulb at the site of surgical repair after DOAC started  Heparin  was stopped, underwent repeat EGD 1/11 with no active bleeding. 1/12: Hemoglobin continues to trend down, symptomatic.  GI recommended observation, no Pete colonoscopy.  Wife frustrated and requested second GI opinion, accepted graciously by GI. Cardiology consulted. 1/13/26[name]  recommended continue heparin  drip resuming heparin  drip until no further GI bleeding before resuming Eliquis  1/19: IV heparin  drip discontinued, started on Eliquis   Assessment & Plan    Duodenal ulcer hemorrhage with coiling 09/2023  s/p laparotomy 12/17, secondary closure 12/19 as above -Vomiting resolved since 12/26.  Off TPN on 12/31.  JP drain removed 12/31 - Now tolerating regular diet - Eliquis  resumed, if H&H remains stable tomorrow, patient can be discharged to SNF    Recurrent GI Bleed and Symptomatic Anemia, Duodenal ulcer - Prolonged hospitalization due to intermittent GI bleeding.  Has had 5 EGDs so far, last performed on 02/11/2024. -02/03/24 and 02/11/24 CTA GIB negative for active bleeding -EGD (1/5) showed no active bleeding or high risk bleeding lesions and the bleeding was suspected to be from friable mucosa in the duodenal bulb at the site of surgical repair after DOAC started - Patient continued to have recurrent GI bleed when placed back on anticoagulation. - Repeat EGD 02/11/24: Normal esophagus with erythematous mucosa in the gastric body with intact suture and erythematous friable mucosa but no evidence of recent bleeding for this area.  Given nonbleeding duodenal diverticulum, felt that the source of his new recurrent bleeding is from another source and possibly a diverticulum - Continue  PI twice daily, sucralfate  1 g 4 times daily - Anemia profile showed iron  57, ferritin 518, folate> 20, B12 904 - Hemoglobin stable and trending up 8.5, eliquis  resumed 1/19.  Heparin  drip off.  GI signed off today  Sepsis, Staph epi Bacteremia-MRSE with resistances Likely peritonitis - Bcx 12/21 postive for staph epi, blood cultures 12/26 showed no growth. - Completed course of antibiotics on 12/30 - RIJ CVC removed 02/02/24    AKI on admission, Metabolic Acidosis - Baseline Cr 0.7 - 0.9.  Likely worsened due to #1, hypotension, GI bleed. - Resolved     Atrial Fibrillation:  -  CHADVASC >4. - Cardiology consulted and had extensive discussion with patient and family.   - If H&H remained stable, no further bleeding, then retrial eliquis .  Can consider Watchman procedure but will need to demonstrate tolerability for [facility] for 45 days post watchman then dual antiplatelet therapy followed by single antiplatelet therapy. - Continue eliquis , hemoglobin 8.5, stable  Constipation - Continue bowel regimen   Right Cephalic Vein Acute SVT and Right Brachial Vein Acute DVT:  - Noted on RUE doppler on 12/29 -Transitioned to Eliquis    Hypernatremia:  - Resolved.   Hypokalemia:  - Replace as needed   Urinary Retention:  - Foley was initially removed and patient was voiding well.  - However, noted to be retaining again so Foley/indwelling cath placed and will be continue at discharge. Will need outpt f/u with Urology   Left Lung Mass Bronchogenic CA:  - Holding Immunotherapy/Chemotherapy. Will need Outpatient follow-up   Critical illness Thrombocytopenia: - Continue to follow    Moderate protein calorie malnutrition Nutrition Problem: Moderate Malnutrition Etiology: chronic illness (COPD, CVA, GIB, NSCLC) Signs/Symptoms: moderate fat depletion, severe muscle depletion, edema - Off TPN   Obesity class III  Estimated body mass index is 38.32 kg/m as calculated from the following:   Height as of this encounter: 6' 0.01 (1.829 m).   Weight as of this encounter: 128.2 kg.  Code Status: Full code DVT Prophylaxis:  Place TED hose Start: 02/15/24 1250 SCDs Start: 01/16/24 1731 apixaban  (ELIQUIS ) tablet 5 mg   Level of Care: Level of care: Telemetry Family Communication: Updated patient Disposition Plan:      Remains inpatient appropriate: If H&H remains stable on eliquis , can DC to SNF in a.m.   Procedures:    Consultants:   Cardiology General Surgery [facility] gastroenterology  Antimicrobials:   Anti-infectives (From admission, onward)    Start      Dose/Rate Route Frequency Ordered Stop   01/29/24 2200  fluconazole  (DIFLUCAN ) IVPB 400 mg  Status:  Discontinued        400 mg 100 mL/hr over 120 Minutes Intravenous Every 24 hours 01/29/24 1336 01/29/24 1346   01/29/24 2200  fluconazole  (DIFLUCAN ) IVPB 400 mg  Status:  Discontinued        400 mg 200 mL/hr over 60 Minutes Intravenous Every 24 hours 01/29/24 1346 01/30/24 1533   01/25/24 1500  vancomycin  (VANCOREADY) IVPB 1500 mg/300 mL  Status:  Discontinued        1,500 mg 150 mL/hr over 120 Minutes Intravenous Every 24 hours 01/24/24 1322 01/30/24 1233   01/24/24 1115  vancomycin  (VANCOCIN ) 2,500 mg in sodium chloride  0.9 % 500 mL IVPB        2,500 mg 262.5 mL/hr over 120 Minutes Intravenous  Once 01/24/24 1029 01/24/24 1530   01/17/24 2245  piperacillin -tazobactam (ZOSYN ) IVPB 3.375 g  Status:  Discontinued  3.375 g 12.5 mL/hr over 240 Minutes Intravenous Every 8 hours 01/17/24 2149 01/30/24 1233   01/17/24 2200  fluconazole  (DIFLUCAN ) IVPB 400 mg  Status:  Discontinued        400 mg 100 mL/hr over 120 Minutes Intravenous Every 24 hours 01/17/24 2059 01/29/24 1336          Medications  apixaban   5 mg Oral BID   budesonide -glycopyrrolate -formoterol   2 puff Inhalation BID   carbamide peroxide  5 drop Right EAR BID   Chlorhexidine  Gluconate Cloth  6 each Topical Daily   feeding supplement (GLUCERNA SHAKE)  237 mL Oral TID BM   Gerhardt's butt cream   Topical BID   lactulose   20 g Oral Once   multivitamin with minerals  1 tablet Oral Daily   mouth rinse  15 mL Mouth Rinse TID   pantoprazole   40 mg Oral BID   senna-docusate  1 tablet Oral BID      Subjective:   Raymond Hurst was seen and examined today.  No acute complaints.  No active bleeding.  H&H stable.  Started on Eliquis  yesterday.  No nausea vomiting abdominal pain..   Objective:   Vitals:   02/19/24 2101 02/20/24 0455 02/20/24 0724 02/20/24 0823  BP: (!) 107/57 102/67 112/65   Pulse: 75 95 77   Resp:  19 19 18    Temp: (!) 97.5 F (36.4 C) 97.6 F (36.4 C) 97.8 F (36.6 C)   TempSrc: Oral Oral    SpO2: 100% 99% 98% 97%  Weight:      Height:        Intake/Output Summary (Last 24 hours) at 02/20/2024 1058 Last data filed at 02/20/2024 1057 Gross per 24 hour  Intake 460 ml  Output 1780 ml  Net -1320 ml     Wt Readings from Last 3 Encounters:  02/15/24 128.2 kg  01/12/24 119.7 kg  01/06/24 118.2 kg   Physical Exam General: Alert and oriented x 3, NAD Cardiovascular: S1 S2 clear, RRR.  Respiratory: CTAB, no wheezing Gastrointestinal: Soft, nontender, nondistended, NBS Ext: no pedal edema bilaterally Neuro: no new deficits Psych: Normal affect    Data Reviewed:  I have personally reviewed following labs    CBC Lab Results  Component Value Date   WBC 6.0 02/20/2024   RBC 2.72 (L) 02/20/2024   HGB 8.5 (L) 02/20/2024   HCT 25.8 (L) 02/20/2024   MCV 94.9 02/20/2024   MCH 31.3 02/20/2024   PLT 148 (L) 02/20/2024   MCHC 32.9 02/20/2024   RDW 19.0 (H) 02/20/2024   LYMPHSABS 0.7 02/14/2024   MONOABS 0.5 02/14/2024   EOSABS 0.2 02/14/2024   BASOSABS 0.0 02/14/2024     Last metabolic panel Lab Results  Component Value Date   NA 137 02/20/2024   K 4.2 02/20/2024   CL 103 02/20/2024   CO2 24 02/20/2024   BUN 19 02/20/2024   CREATININE 1.29 (H) 02/20/2024   GLUCOSE 95 02/20/2024   GFRNONAA 59 (L) 02/20/2024   GFRAA >60 06/13/2019   CALCIUM  9.3 02/20/2024   PHOS 3.2 02/14/2024   PROT 5.7 (L) 02/14/2024   ALBUMIN 3.3 (L) 02/14/2024   LABGLOB 3.2 06/01/2023   AGRATIO 1.3 07/21/2020   BILITOT 0.4 02/14/2024   ALKPHOS 64 02/14/2024   AST 17 02/14/2024   ALT 16 02/14/2024   ANIONGAP 10 02/20/2024    CBG (last 3)  Recent Labs    02/19/24 2103  GLUCAP 151*      Coagulation Profile:  No results for input(s): INR, PROTIME in the last 168 hours.   Radiology Studies: I have personally reviewed the imaging studies  No results found.     Nydia Distance M.D. [name] 02/20/2024, 10:58 AM  Available via Epic secure chat 7am-7pm After 7 pm, please refer to night coverage provider listed on amion.    "

## 2024-02-20 NOTE — TOC Progression Note (Signed)
 Transition of Care [facility]) - Progression Note    Patient Details  Name: Raymond Hurst MRN: [id number] Date of Birth: [birth year]  Transition of Care [facility]) CM/SW Contact  Lendia Dais, [place] Phone Number: 02/20/2024, 4:28 PM  Clinical Narrative:  Shara pending. Auth ID is [id number].  CSW will continue to monitor.      Barriers to Discharge: Continued Medical Work up               Expected Discharge Plan and Services                                               Social Drivers of Health (SDOH) Interventions SDOH Screenings   Food Insecurity: No Food Insecurity (01/16/2024)  Housing: Low Risk (01/26/2024)  Transportation Needs: No Transportation Needs (01/16/2024)  Utilities: Not At Risk (01/16/2024)  Alcohol Screen: Low Risk (06/15/2023)  Depression (PHQ2-9): Low Risk (01/12/2024)  Financial Resource Strain: High Risk (06/15/2023)  Physical Activity: Inactive (06/15/2023)  Social Connections: Moderately Isolated (01/16/2024)  Stress: No Stress Concern Present (06/15/2023)  Tobacco Use: Medium Risk (02/11/2024)    Readmission Risk Interventions    09/08/2023   12:09 PM  Readmission Risk Prevention Plan  Transportation Screening Complete  HRI or Home Care Consult Complete  Social Work Consult for Recovery Care Planning/Counseling Complete  Palliative Care Screening Not Applicable  Medication Review Oceanographer) Complete

## 2024-02-21 ENCOUNTER — Ambulatory Visit: Admitting: Student

## 2024-02-21 LAB — CBC
HCT: 27.3 % — ABNORMAL LOW (ref 39.0–52.0)
Hemoglobin: 8.9 g/dL — ABNORMAL LOW (ref 13.0–17.0)
MCH: 31.3 pg (ref 26.0–34.0)
MCHC: 32.6 g/dL (ref 30.0–36.0)
MCV: 96.1 fL (ref 80.0–100.0)
Platelets: 154 K/uL (ref 150–400)
RBC: 2.84 MIL/uL — ABNORMAL LOW (ref 4.22–5.81)
RDW: 19 % — ABNORMAL HIGH (ref 11.5–15.5)
WBC: 7 K/uL (ref 4.0–10.5)
nRBC: 0 % (ref 0.0–0.2)

## 2024-02-21 NOTE — TOC Transition Note (Signed)
 Transition of Care [facility]) - Discharge Note   Patient Details  Name: Raymond Hurst MRN: [id number] Date of Birth: March 14, 1950  Transition of Care [facility]) CM/SW Contact:  Waddell Barnie Rama, RN Phone Number: 02/21/2024, 2:34 PM   Clinical Narrative:    For dc today, he states his sister will transport him home today, he has a follow up apt with urology on AVS , and a hospital follow up.  He has a referral to [name], OP rehab for OP PT, sent thru epic.      Barriers to Discharge: Continued Medical Work up   Patient Goals and CMS Choice            Discharge Placement                       Discharge Plan and Services Additional resources added to the After Visit Summary for                                       Social Drivers of Health (SDOH) Interventions SDOH Screenings   Food Insecurity: No Food Insecurity (01/16/2024)  Housing: Low Risk (01/26/2024)  Transportation Needs: No Transportation Needs (01/16/2024)  Utilities: Not At Risk (01/16/2024)  Alcohol Screen: Low Risk (06/15/2023)  Depression (PHQ2-9): Low Risk (01/12/2024)  Financial Resource Strain: High Risk (06/15/2023)  Physical Activity: Inactive (06/15/2023)  Social Connections: Moderately Isolated (01/16/2024)  Stress: No Stress Concern Present (06/15/2023)  Tobacco Use: Medium Risk (02/11/2024)     Readmission Risk Interventions    09/08/2023   12:09 PM  Readmission Risk Prevention Plan  Transportation Screening Complete  HRI or Home Care Consult Complete  Social Work Consult for Recovery Care Planning/Counseling Complete  Palliative Care Screening Not Applicable  Medication Review Oceanographer) Complete

## 2024-02-21 NOTE — TOC Progression Note (Addendum)
 Transition of Care [facility]) - Progression Note    Patient Details  Name: MOATAZ TAVIS MRN: [id number] Date of Birth: [birth year]  Transition of Care [facility]) CM/SW Contact  Waddell Barnie Rama, RN Phone Number: 02/21/2024, 1:49 PM  Clinical Narrative:    NCM notified that patient would like to to go home with OP PT set up.  He states he would like to go to [facility]. He states he knows how to do his own dressing changes and he will show his wife how to do them as well.  He will buy his own dressing supplies.  Staff Nurse will give him a couple to start with.     Barriers to Discharge: Continued Medical Work up               Expected Discharge Plan and Services         Expected Discharge Date: 02/21/24                                     Social Drivers of Health (SDOH) Interventions SDOH Screenings   Food Insecurity: No Food Insecurity (01/16/2024)  Housing: Low Risk (01/26/2024)  Transportation Needs: No Transportation Needs (01/16/2024)  Utilities: Not At Risk (01/16/2024)  Alcohol Screen: Low Risk (06/15/2023)  Depression (PHQ2-9): Low Risk (01/12/2024)  Financial Resource Strain: High Risk (06/15/2023)  Physical Activity: Inactive (06/15/2023)  Social Connections: Moderately Isolated (01/16/2024)  Stress: No Stress Concern Present (06/15/2023)  Tobacco Use: Medium Risk (02/11/2024)    Readmission Risk Interventions    09/08/2023   12:09 PM  Readmission Risk Prevention Plan  Transportation Screening Complete  HRI or Home Care Consult Complete  Social Work Consult for Recovery Care Planning/Counseling Complete  Palliative Care Screening Not Applicable  Medication Review Oceanographer) Complete

## 2024-02-21 NOTE — TOC Progression Note (Signed)
 Transition of Care [facility]) - Progression Note    Patient Details  Name: Raymond Hurst MRN: [id number] Date of Birth: [birth year]  Transition of Care [facility]) CM/SW Contact  Lendia Dais, [place] Phone Number: 02/21/2024, 1:46 PM  Clinical Narrative: PT informed medical team that recommendation has been changed to [facility] PT. CSW called [employer] to withdrawal insurance auth for SNF. CSW informed [facility] [place] as well.  No further needs.      Barriers to Discharge: Continued Medical Work up               Expected Discharge Plan and Services         Expected Discharge Date: 02/21/24                                     Social Drivers of Health (SDOH) Interventions SDOH Screenings   Food Insecurity: No Food Insecurity (01/16/2024)  Housing: Low Risk (01/26/2024)  Transportation Needs: No Transportation Needs (01/16/2024)  Utilities: Not At Risk (01/16/2024)  Alcohol Screen: Low Risk (06/15/2023)  Depression (PHQ2-9): Low Risk (01/12/2024)  Financial Resource Strain: High Risk (06/15/2023)  Physical Activity: Inactive (06/15/2023)  Social Connections: Moderately Isolated (01/16/2024)  Stress: No Stress Concern Present (06/15/2023)  Tobacco Use: Medium Risk (02/11/2024)    Readmission Risk Interventions    09/08/2023   12:09 PM  Readmission Risk Prevention Plan  Transportation Screening Complete  HRI or Home Care Consult Complete  Social Work Consult for Recovery Care Planning/Counseling Complete  Palliative Care Screening Not Applicable  Medication Review Oceanographer) Complete

## 2024-02-21 NOTE — Discharge Summary (Signed)
 " Physician Discharge Summary   Patient: Raymond Hurst MRN: [id number] DOB: 1950-10-03  Admit date:     01/16/2024  Discharge date: 02/21/24  Discharge Physician: Drue ONEIDA Potter   PCP: [name], MD   Recommendations at discharge:  Follow-up with urologist  Discharge Diagnoses:  Duodenal ulcer hemorrhage with coiling 09/2023  s/p laparotomy 12/17, secondary closure 12/19 as above Recurrent GI Bleed and Symptomatic Anemia, Duodenal ulcer Sepsis, Staph epi Bacteremia-MRSE with resistances Likely peritonitis AKI on admission, Metabolic Acidosis Atrial Fibrillation:  Right Cephalic Vein Acute SVT and Right Brachial Vein Acute DVT:  Hypernatremia:  Hypokalemia:  Urinary Retention:  Left Lung Mass Bronchogenic CA:  Critical illness Thrombocytopenia: Moderate protein calorie malnutrition Obesity class III   Hospital Course: Patient is a 74 year old male with Left lung mass bronchogenic CA (poorly differentiated squamous cell) diagnosed 06/05/2023-bronchoscopy 08/09/2023 complicated by left PNA thorax-follows with Dr. [name], A-fib on Eliquis , previous alcohol use, UGIB 09/09/2023 cratered ulcer duodenum status post mesenteric angio/embolization at [facility].  Readmission 12/6-12/8 with upper GI bleed.  EGD clean-based ulcer told to continue twice daily PPI manage resumed Eliquis  hemoglobin was 9.6 on discharge at that time    Significant Events: 12/16 [facility] ED upper GI bleed with bright red vomiting  12/17 : Transferred to [facility].  Status post duodenal bleed Clip by IR.  Continued ongoing bleeding requiring pressors, massive transfusion protocol, emergent ex lap Dr. Rubin with duodenotomy ligation GDA branches VAC placement 12/19 Secondary closure of abdomen Dr. Ebbie  12/26 barium swallow performed and upper GI 1226 without evidence of leak.  Prolonged hospital course with intermittent bleeding.  Repeat EGD on 1/5 showed no active bleeding or high risk bleeding lesions. EGD 1/5  showed no active bleeding or high risk bleeding lesions and the bleeding was suspected to be from friable mucosa in the duodenal bulb at the site of surgical repair after DOAC started  Heparin  was stopped, underwent repeat EGD 1/11 with no active bleeding. 1/12: Hemoglobin continues to trend down, symptomatic.  GI recommended observation, no Pete colonoscopy.  Wife frustrated and requested second GI opinion, accepted graciously by GI. Cardiology consulted. 1/13/26[name] recommended continue heparin  drip resuming heparin  drip until no further GI bleeding before resuming Eliquis  1/19: IV heparin  drip discontinued, started on Eliquis      Assessment & Plan   Duodenal ulcer hemorrhage with coiling 09/2023  s/p laparotomy 12/17, secondary closure 12/19 as above -Vomiting resolved since 12/26.  Off TPN on 12/31.  JP drain removed 12/31 - Now tolerating regular diet Patient was planned for discharge to SNF however have improved with physical therapist today and patient and family have agreed for discharge home   Recurrent GI Bleed and Symptomatic Anemia, Duodenal ulcer - Prolonged hospitalization due to intermittent GI bleeding.  Has had 5 EGDs so far, last performed on 02/11/2024. -02/03/24 and 02/11/24 CTA GIB negative for active bleeding -EGD (1/5) showed no active bleeding or high risk bleeding lesions and the bleeding was suspected to be from friable mucosa in the duodenal bulb at the site of surgical repair after DOAC started - Patient continued to have recurrent GI bleed when placed back on anticoagulation. - Repeat EGD 02/11/24: Normal esophagus with erythematous mucosa in the gastric body with intact suture and erythematous friable mucosa but no evidence of recent bleeding for this area.  Given nonbleeding duodenal diverticulum, felt that the source of his new recurrent bleeding is from another source and possibly a diverticulum - Continue PI twice daily,  sucralfate  1 g 4 times daily - Anemia  profile showed iron  57, ferritin 518, folate> 20, B12 904 - Hemoglobin stable and trending up 8.5, eliquis  resumed 1/19.  Heparin  drip off.  GI signed off    Sepsis, Staph epi Bacteremia-MRSE with resistances Likely peritonitis - Bcx 12/21 postive for staph epi, blood cultures 12/26 showed no growth. - Completed course of antibiotics on 12/30 - RIJ CVC removed 02/02/24     AKI on admission, Metabolic Acidosis - Baseline Cr 0.7 - 0.9.  Likely worsened due to #1, hypotension, GI bleed. - Resolved      Atrial Fibrillation:  - CHADVASC >4. - Cardiology consulted and had extensive discussion with patient and family.   - If H&H remained stable, no further bleeding, then retrial eliquis .  Can consider Watchman procedure but will need to demonstrate tolerability for [facility] for 45 days post watchman then dual antiplatelet therapy followed by single antiplatelet therapy. - Continue eliquis , hemoglobin 8.5, stable   Constipation - Continue bowel regimen   Right Cephalic Vein Acute SVT and Right Brachial Vein Acute DVT:  - Noted on RUE doppler on 12/29 -Transitioned to Eliquis    Hypernatremia:  - Resolved.   Hypokalemia:  - Replace as needed    Urinary Retention:  - Foley was initially removed and patient was voiding well.  - However, noted to be retaining again so Foley/indwelling cath placed and will be continue at discharge. Will need outpt f/u with Urology   Left Lung Mass Bronchogenic CA:  - Holding Immunotherapy/Chemotherapy. Will need Outpatient follow-up   Critical illness Thrombocytopenia: - Continue to follow     Moderate protein calorie malnutrition Nutrition Problem: Moderate Malnutrition Etiology: chronic illness (COPD, CVA, GIB, NSCLC) Signs/Symptoms: moderate fat depletion, severe muscle depletion, edema - Off TPN     Obesity class III      Consultants: As mentioned above Procedures performed: As mentioned above Disposition: Home Diet recommendation:  Cardiac  diet DISCHARGE MEDICATION: Allergies as of 02/21/2024   No Known Allergies      Medication List     STOP taking these medications    acetaminophen  500 MG tablet Commonly known as: TYLENOL    cyanocobalamin  100 MCG tablet Commonly known as: VITAMIN B12   folic acid  1 MG tablet Commonly known as: FOLVITE    furosemide  20 MG tablet Commonly known as: LASIX    metoprolol  tartrate 25 MG tablet Commonly known as: LOPRESSOR        TAKE these medications    allopurinol  100 MG tablet Commonly known as: ZYLOPRIM  Take 100 mg by mouth daily.   apixaban  5 MG Tabs tablet Commonly known as: ELIQUIS  Take 1 tablet (5 mg total) by mouth 2 (two) times daily. Okay to restart this medication on 08/29/2023   Breztri  Aerosphere 160-9-4.8 MCG/ACT Aero inhaler Generic drug: budesonide -glycopyrrolate -formoterol  Inhale 2 puffs into the lungs in the morning and at bedtime.   cholecalciferol  25 MCG (1000 UNIT) tablet Commonly known as: VITAMIN D3 Take 1,000 Units by mouth daily.   ipratropium-albuterol  0.5-2.5 (3) MG/3ML Soln Commonly known as: DUONEB Take 3 mLs by nebulization every 6 (six) hours as needed.   loperamide  2 MG tablet Commonly known as: IMODIUM  A-D Take 2-4 mg by mouth 4 (four) times daily as needed for diarrhea or loose stools.   pantoprazole  40 MG tablet Commonly known as: PROTONIX  TAKE 1 TABLET (40 MG TOTAL) BY MOUTH TWICE A DAY BEFORE MEALS What changed: See the new instructions.   thiamine  100 MG tablet Commonly known as:  Vitamin B-1 Take 100 mg by mouth daily.        Follow-up Information     Rubin Calamity, MD Follow up on 02/12/2024.   Specialty: General Surgery Why: 1:30 pm Contact information: [address] [place] [address] [phone]         Delia Ole ORN, NP. Call.   Specialty: Urology Contact information: [address] [employer] [address] [place] [address] [phone]                Discharge Exam: Fredricka  Weights   01/25/24 0452 01/29/24 0406 02/15/24 1146  Weight: 127.2 kg (!) 136.7 kg 128.2 kg   General: Alert and oriented x 3, NAD Cardiovascular: S1 S2 clear, RRR.  Respiratory: CTAB, no wheezing Gastrointestinal: Soft, nontender, nondistended, NBS Ext: no pedal edema bilaterally Neuro: no new deficits Psych: Normal affect   Condition at discharge: good  The results of significant diagnostics from this hospitalization (including imaging, microbiology, ancillary and laboratory) are listed below for reference.   Imaging Studies: CT ANGIO GI BLEED Result Date: 02/11/2024 CLINICAL DATA:  Gastrointestinal bleeding, negative EGD EXAM: CTA ABDOMEN AND PELVIS WITHOUT AND WITH CONTRAST TECHNIQUE: Multidetector CT imaging of the abdomen and pelvis was performed using the standard protocol during bolus administration of intravenous contrast. Multiplanar reconstructed images and MIPs were obtained and reviewed to evaluate the vascular anatomy. RADIATION DOSE REDUCTION: This exam was performed according to the departmental dose-optimization program which includes automated exposure control, adjustment of the mA and/or kV according to patient size and/or use of iterative reconstruction technique. CONTRAST:  OMNIPAQUE  IOHEXOL  350 MG/ML SOLN COMPARISON:  02/03/2024 FINDINGS: VASCULAR Aorta: Normal caliber aorta without aneurysm, dissection, vasculitis or significant stenosis. Diffuse atherosclerosis. Celiac: Patent without evidence of aneurysm, dissection, vasculitis or significant stenosis. Stable embolic coils within the gastroduodenal artery distribution from prior embolization procedure. SMA: Patent without evidence of aneurysm, dissection, vasculitis or significant stenosis. Mild atherosclerosis. Renals: Both renal arteries are patent without evidence of aneurysm, dissection, vasculitis, fibromuscular dysplasia or significant stenosis. Mild atherosclerosis. IMA: Patent without evidence of aneurysm,  dissection, vasculitis or significant stenosis. Inflow: Patent without evidence of aneurysm, dissection, vasculitis or significant stenosis. Mild atherosclerosis. Proximal Outflow: Bilateral common femoral and visualized portions of the superficial and profunda femoral arteries are patent without evidence of aneurysm, dissection, vasculitis or significant stenosis. Veins: No obvious venous abnormality within the limitations of this arterial phase study. Review of the MIP images confirms the above findings. NON-VASCULAR Lower chest: Small left pleural effusion. No acute airspace disease. Hepatobiliary: No focal liver abnormality is seen. No gallstones, gallbladder wall thickening, or biliary dilatation. Pancreas: Unremarkable. No pancreatic ductal dilatation or surrounding inflammatory changes. Spleen: Normal in size without focal abnormality. Adrenals/Urinary Tract: Stable right adrenal myelolipoma. Left adrenal is unremarkable. Stable appearance of the bilateral kidneys. No urinary tract calculi or obstruction. Evaluation of the bladder is limited due to decompression with a Foley catheter. There is persistent bladder wall thickening with multiple bladder diverticula compatible with sequela of chronic bladder outlet obstruction. Stomach/Bowel: No bowel obstruction or ileus. Normal appendix right lower quadrant. Diffuse colonic diverticulosis. Mild wall thickening and pericolonic fat stranding at the sigmoid colon has improved since prior study, consistent with resolving diverticulitis. No perforation, fluid collection, or abscess. No intraluminal contrast accumulation to suggest active gastrointestinal hemorrhage. Lymphatic: No pathologic adenopathy. Reproductive: Dense calcifications within the prostate. Other: No free fluid or free intraperitoneal gas. No abdominal wall hernia. Musculoskeletal: No acute or destructive bony abnormalities.  Stable chronic L1 compression deformity. Reconstructed images demonstrate no  additional findings. IMPRESSION: VASCULAR 1. No evidence of active gastrointestinal bleeding. 2.  Aortic Atherosclerosis (ICD10-I70.0). NON-VASCULAR 1. Colonic diverticulosis, with improving diverticulitis of the sigmoid colon. No perforation, fluid collection, or abscess. 2. Stable right adrenal myelolipoma. 3. Stable sequela of chronic bladder outlet obstruction. The bladder is decompressed with a Foley catheter. 4. Small left pleural effusion. Electronically Signed   By: Ozell Daring M.D.   On: 02/11/2024 15:30   CT ANGIO GI BLEED Result Date: 02/03/2024 CLINICAL DATA:  Lower gastrointestinal bleeding EXAM: CTA ABDOMEN AND PELVIS WITHOUT AND WITH CONTRAST TECHNIQUE: Multidetector CT imaging of the abdomen and pelvis was performed using the standard protocol during bolus administration of intravenous contrast. Multiplanar reconstructed images and MIPs were obtained and reviewed to evaluate the vascular anatomy. RADIATION DOSE REDUCTION: This exam was performed according to the departmental dose-optimization program which includes automated exposure control, adjustment of the mA and/or kV according to patient size and/or use of iterative reconstruction technique. CONTRAST:  75mL OMNIPAQUE  IOHEXOL  350 MG/ML SOLN COMPARISON:  January 06, 2024 FINDINGS: VASCULAR Aorta: Aortic atherosclerosis without aneurysm or dissection. Celiac: Patent without evidence of aneurysm, dissection, vasculitis or significant stenosis. SMA: Patent without evidence of aneurysm, dissection, vasculitis or significant stenosis. Renals: Both renal arteries are patent without evidence of aneurysm, dissection, vasculitis, fibromuscular dysplasia or significant stenosis. IMA: Patent without evidence of aneurysm, dissection, vasculitis or significant stenosis. Inflow: Patent without evidence of aneurysm, dissection, vasculitis or significant stenosis. Proximal Outflow: Bilateral common femoral and visualized portions of the superficial and  profunda femoral arteries are patent without evidence of aneurysm, dissection, vasculitis or significant stenosis. Veins: No obvious venous abnormality within the limitations of this arterial phase study. Review of the MIP images confirms the above findings. NON-VASCULAR Lower chest: No acute abnormality. Hepatobiliary: No focal liver abnormality is seen. No gallstones, gallbladder wall thickening, or biliary dilatation. Pancreas: Unremarkable. No pancreatic ductal dilatation or surrounding inflammatory changes. Spleen: Normal in size without focal abnormality. Adrenals/Urinary Tract: Stable right adrenal myelolipoma. Left adrenal gland is unremarkable. Stable bilateral renal cysts. No hydronephrosis or renal obstruction. Urinary bladder is decompressed secondary to Foley catheter. Large left-sided diverticulum is again noted. Stomach/Bowel: The stomach is unremarkable. The appendix appears normal. There is no evidence of bowel obstruction. Diverticulosis is noted throughout the colon. Minimal inflammatory changes are seen adjacent to proximal sigmoid colon suggesting possible mild diverticulitis. Lymphatic: No adenopathy is noted. Reproductive: Prostatic calcifications are again noted and unchanged. Other: No ascites or hernia is noted. Musculoskeletal: Old L1 compression fracture is noted. IMPRESSION: 1. No evidence of active gastrointestinal bleeding. 2. Diverticulosis is noted throughout the colon. Minimal inflammatory changes are seen adjacent to proximal sigmoid colon suggesting possible mild diverticulitis. 3. Stable right adrenal myelolipoma. 4. Stable bilateral renal cysts. 5. Urinary bladder is decompressed secondary to Foley catheter. Large left-sided diverticulum is again noted. 6. Aortic atherosclerosis. Aortic Atherosclerosis (ICD10-I70.0). Electronically Signed   By: Lynwood Landy Raddle M.D.   On: 02/03/2024 15:40   VAS US  UPPER EXTREMITY VENOUS DUPLEX Result Date: 01/30/2024 UPPER VENOUS STUDY   Patient Name:  KAYDN KUMPF  Date of Exam:   01/29/2024 Medical Rec #: [id number]       Accession #:    [id number] Date of Birth: Feb 09, 1950       Patient Gender: M Patient Age:   62 years Exam Location:  [facility] Procedure:      VAS US  UPPER EXTREMITY VENOUS DUPLEX  Referring Phys: STEPHEN CHIU --------------------------------------------------------------------------------  Indications: Swelling Risk Factors: Cancer Lung. Limitations: R IJV Central Line. Performing Technologist: Garnette Rockers  Examination Guidelines: A complete evaluation includes B-mode imaging, spectral Doppler, color Doppler, and power Doppler as needed of all accessible portions of each vessel. Bilateral testing is considered an integral part of a complete examination. Limited examinations for reoccurring indications may be performed as noted.  Right Findings: +----------+------------+---------+-----------+------------------+----------+ RIGHT     CompressiblePhasicitySpontaneous    Properties     Summary   +----------+------------+---------+-----------+------------------+----------+ IJV                                                         Obstructed +----------+------------+---------+-----------+------------------+----------+ Subclavian    Full       Yes       Yes                                 +----------+------------+---------+-----------+------------------+----------+ Axillary      Full       Yes       Yes                                 +----------+------------+---------+-----------+------------------+----------+ Brachial      None       No        No         retracted      Chronic   +----------+------------+---------+-----------+------------------+----------+ Radial        Full       Yes       Yes                                 +----------+------------+---------+-----------+------------------+----------+ Ulnar         Full       Yes       Yes                                  +----------+------------+---------+-----------+------------------+----------+ Cephalic      None       No        No     brightly echogenic  Acute    +----------+------------+---------+-----------+------------------+----------+ Basilic       Full                 Yes                                 +----------+------------+---------+-----------+------------------+----------+ Occlusive Chronic DVT seen in the distal portion of 1/2 Brachial Veins. Occlusive SVT seen through the full length of the Cephalic Vein.  Left Findings: +----------+------------+---------+-----------+----------+-------+ LEFT      CompressiblePhasicitySpontaneousPropertiesSummary +----------+------------+---------+-----------+----------+-------+ Subclavian               Yes       Yes                      +----------+------------+---------+-----------+----------+-------+  Summary:  Right: Findings consistent with acute superficial vein thrombosis involving the right cephalic vein. Findings consistent with chronic  deep vein thrombosis involving the right brachial veins.  Left: No evidence of thrombosis in the subclavian.  *See table(s) above for measurements and observations.  Diagnosing physician: Fonda Rim Electronically signed by Fonda Rim on 01/30/2024 at 9:24:05 AM.    Final    DG UGI W SINGLE CM (SOL OR THIN BA) Result Date: 01/26/2024 CLINICAL DATA:  Perforated ulcer EXAM: WATER  SOLUBLE UPPER GI SERIES TECHNIQUE: Single-column upper GI series was performed using water  soluble contrast. Radiation Exposure Index (as provided by the fluoroscopic device): 213.5 mGy Kerma CONTRAST:  OMNIPAQUE  IOHEXOL  300 MG/ML  SOLN COMPARISON:  CT scan of January 06, 2024 FLUOROSCOPY: Radiation Exposure Index (if provided by the fluoroscopic device): 213.5 mGy FINDINGS: Visualized portion of the esophagus is unremarkable. The flow of contrast from esophagus into stomach is noted. Contrast slowly flowed from the stomach  into duodenum. No definite leakage or extravasation is noted. Large diverticulum is seen arising from third portion of duodenum. Embolization coils are noted near second portion of duodenum. Visualized portion of proximal small bowel is otherwise unremarkable. IMPRESSION: No definite leakage or extravasation of contrast is noted. Large diverticulum is seen arising from third portion of duodenum. Electronically Signed   By: Lynwood Landy Raddle M.D.   On: 01/26/2024 12:29   DG Abd Portable 1V Result Date: 01/23/2024 CLINICAL DATA:  Feeding tube placement. EXAM: PORTABLE ABDOMEN - 1 VIEW COMPARISON:  01/19/2024 FINDINGS: Tip and side port of the enteric tube below the diaphragm in the stomach. Again seen coiling in the upper abdomen. Catheter/drain in the right mid abdomen is again seen. No bowel dilatation in the included upper abdomen. Retrocardiac opacity peer IMPRESSION: Tip and side port of the enteric tube below the diaphragm in the stomach. Electronically Signed   By: Andrea Gasman M.D.   On: 01/23/2024 15:38   DG CHEST PORT 1 VIEW Result Date: 01/23/2024 EXAM: 1 VIEW(S) XRAY OF THE CHEST 01/23/2024 09:41:00 AM COMPARISON: 01/20/2024 and Chest CT 08/26/2023. CLINICAL HISTORY: 73y 64m M. Respiratory failure (Hepatocellular Carcinoma). History of treated left lung cancer. FINDINGS: LINES, TUBES AND DEVICES: ETT stable in position. Right IJ CVC with tip at superior cavoatrial junction. Enteric tube extends below diaphragm with tip beyond inferior film margin. LUNGS AND PLEURA: Multifocal confluent left upper and lower lung opacity, not significantly changed from recent radiographs, progressed from restaging chest CT in 08/26/2023 but nonspecific. Moderate left pleural effusion. Small right pleural effusion. Right basilar airspace opacities. Right lung otherwise remains well aerated. No pneumothorax. HEART AND MEDIASTINUM: No acute abnormality of the cardiac and mediastinal silhouettes. BONES AND SOFT  TISSUES: No acute osseous abnormality. IMPRESSION: 1. Stable lines and tubes. 2. Multifocal confluent Left lung opacity, stable recently and progressed since lung cancer restaging CT in July. 3. Stable small right pleural effusion. 4. No no new cardiopulmonary abnormality. Electronically signed by: Helayne Hurst MD 01/23/2024 10:56 AM EST RP Workstation: [id number]    Microbiology: Results for orders placed or performed during the hospital encounter of 01/16/24  MRSA Next Gen by PCR, Nasal     Status: None   Collection Time: 01/16/24  8:47 PM   Specimen: Nasal Mucosa; Nasal Swab  Result Value Ref Range Status   MRSA by PCR Next Gen NOT DETECTED NOT DETECTED Final    Comment: (NOTE) The GeneXpert MRSA Assay (FDA approved for NASAL specimens only), is one component of a comprehensive MRSA colonization surveillance program. It is not intended to diagnose MRSA infection nor to guide or monitor treatment  for MRSA infections. Test performance is not FDA approved in patients less than 92 years old. Performed at [facility], [address], [place], [place] [address]   Culture, blood (Routine X 2) w Reflex to ID Panel     Status: Abnormal   Collection Time: 01/21/24 11:00 PM   Specimen: BLOOD  Result Value Ref Range Status   Specimen Description BLOOD SITE NOT SPECIFIED  Final   Special Requests   Final    BOTTLES DRAWN AEROBIC AND ANAEROBIC Blood Culture adequate volume   Culture  Setup Time   Final    GRAM POSITIVE COCCI ANAEROBIC BOTTLE ONLY CRITICAL VALUE NOTED.  VALUE IS CONSISTENT WITH PREVIOUSLY REPORTED AND CALLED VALUE.    Culture (A)  Final    STAPHYLOCOCCUS EPIDERMIDIS SUSCEPTIBILITIES PERFORMED ON PREVIOUS CULTURE WITHIN THE LAST 5 DAYS. Performed at [facility], [address], [place], [place] [address]    Report Status 01/25/2024 FINAL  Final  Culture, blood (Routine X 2) w Reflex to ID Panel     Status: Abnormal   Collection Time: 01/21/24 11:12 PM   Specimen: BLOOD   Result Value Ref Range Status   Specimen Description BLOOD SITE NOT SPECIFIED  Final   Special Requests   Final    BOTTLES DRAWN AEROBIC ONLY Blood Culture results may not be optimal due to an inadequate volume of blood received in culture bottles   Culture  Setup Time   Final    GRAM POSITIVE COCCI IN CLUSTERS AEROBIC BOTTLE ONLY CRITICAL RESULT CALLED TO, READ BACK BY AND VERIFIED WITH: MAYA ALMARIE HERO [phone] FCP Performed at [facility], [address], [place], [place] [address]    Culture STAPHYLOCOCCUS EPIDERMIDIS (A)  Final   Report Status 01/25/2024 FINAL  Final   Organism ID, Bacteria STAPHYLOCOCCUS EPIDERMIDIS  Final      Susceptibility   Staphylococcus epidermidis - MIC*    CIPROFLOXACIN 4 RESISTANT Resistant     ERYTHROMYCIN <=0.25 SENSITIVE Sensitive     GENTAMICIN >=16 RESISTANT Resistant     OXACILLIN >=4 RESISTANT Resistant     TETRACYCLINE 2 SENSITIVE Sensitive     VANCOMYCIN  1 SENSITIVE Sensitive     TRIMETH /SULFA  80 RESISTANT Resistant     CLINDAMYCIN <=0.25 SENSITIVE Sensitive     RIFAMPIN <=0.5 SENSITIVE Sensitive     Inducible Clindamycin NEGATIVE Sensitive     * STAPHYLOCOCCUS EPIDERMIDIS  Blood Culture ID Panel (Reflexed)     Status: Abnormal   Collection Time: 01/21/24 11:12 PM  Result Value Ref Range Status   Enterococcus faecalis NOT DETECTED NOT DETECTED Final   Enterococcus Faecium NOT DETECTED NOT DETECTED Final   Listeria monocytogenes NOT DETECTED NOT DETECTED Final   Staphylococcus species DETECTED (A) NOT DETECTED Final    Comment: CRITICAL RESULT CALLED TO, READ BACK BY AND VERIFIED WITH: PHARMD ELIZABETH M [id number] FCP    Staphylococcus aureus (BCID) NOT DETECTED NOT DETECTED Final   Staphylococcus epidermidis DETECTED (A) NOT DETECTED Final    Comment: Methicillin (oxacillin) resistant coagulase negative staphylococcus. Possible blood culture contaminant (unless isolated from more than one blood culture draw or clinical  case suggests pathogenicity). No antibiotic treatment is indicated for blood  culture contaminants. CRITICAL RESULT CALLED TO, READ BACK BY AND VERIFIED WITH: PHARMD ELIZABETH M [id number] FCP    Staphylococcus lugdunensis NOT DETECTED NOT DETECTED Final   Streptococcus species NOT DETECTED NOT DETECTED Final   Streptococcus agalactiae NOT DETECTED NOT DETECTED Final  Streptococcus pneumoniae NOT DETECTED NOT DETECTED Final   Streptococcus pyogenes NOT DETECTED NOT DETECTED Final   A.calcoaceticus-baumannii NOT DETECTED NOT DETECTED Final   Bacteroides fragilis NOT DETECTED NOT DETECTED Final   Enterobacterales NOT DETECTED NOT DETECTED Final   Enterobacter cloacae complex NOT DETECTED NOT DETECTED Final   Escherichia coli NOT DETECTED NOT DETECTED Final   Klebsiella aerogenes NOT DETECTED NOT DETECTED Final   Klebsiella oxytoca NOT DETECTED NOT DETECTED Final   Klebsiella pneumoniae NOT DETECTED NOT DETECTED Final   Proteus species NOT DETECTED NOT DETECTED Final   Salmonella species NOT DETECTED NOT DETECTED Final   Serratia marcescens NOT DETECTED NOT DETECTED Final   Haemophilus influenzae NOT DETECTED NOT DETECTED Final   Neisseria meningitidis NOT DETECTED NOT DETECTED Final   Pseudomonas aeruginosa NOT DETECTED NOT DETECTED Final   Stenotrophomonas maltophilia NOT DETECTED NOT DETECTED Final   Candida albicans NOT DETECTED NOT DETECTED Final   Candida auris NOT DETECTED NOT DETECTED Final   Candida glabrata NOT DETECTED NOT DETECTED Final   Candida krusei NOT DETECTED NOT DETECTED Final   Candida parapsilosis NOT DETECTED NOT DETECTED Final   Candida tropicalis NOT DETECTED NOT DETECTED Final   Cryptococcus neoformans/gattii NOT DETECTED NOT DETECTED Final   Methicillin resistance mecA/C DETECTED (A) NOT DETECTED Final    Comment: CRITICAL RESULT CALLED TO, READ BACK BY AND VERIFIED WITH: MAYA ALMARIE HERO [id number] FCP Performed at [facility], [address], [place], [place] [address]   Culture, blood (Routine X 2) w Reflex to ID Panel     Status: None   Collection Time: 01/26/24  2:24 PM   Specimen: BLOOD RIGHT HAND  Result Value Ref Range Status   Specimen Description BLOOD RIGHT HAND  Final   Special Requests   Final    BOTTLES DRAWN AEROBIC ONLY Blood Culture results may not be optimal due to an inadequate volume of blood received in culture bottles   Culture   Final    NO GROWTH 5 DAYS Performed at [facility], [address], [place], [place] [address]    Report Status 01/31/2024 FINAL  Final  Culture, blood (Routine X 2) w Reflex to ID Panel     Status: None   Collection Time: 01/26/24  2:27 PM   Specimen: BLOOD LEFT HAND  Result Value Ref Range Status   Specimen Description BLOOD LEFT HAND  Final   Special Requests   Final    BOTTLES DRAWN AEROBIC ONLY Blood Culture results may not be optimal due to an inadequate volume of blood received in culture bottles   Culture   Final    NO GROWTH 5 DAYS Performed at [facility], [address] [place], [place] [address]    Report Status 01/31/2024 FINAL  Final    Labs: CBC: Recent Labs  Lab 02/17/24 0557 02/18/24 0411 02/19/24 0331 02/20/24 0329 02/21/24 0335  WBC 6.3 6.1 6.1 6.0 7.0  HGB 8.1* 8.0* 8.2* 8.5* 8.9*  HCT 24.0* 23.8* 25.1* 25.8* 27.3*  MCV 92.3 92.6 94.0 94.9 96.1  PLT 152 152 149* 148* 154   Basic Metabolic Panel: Recent Labs  Lab 02/20/24 0329  NA 137  K 4.2  CL 103  CO2 24  GLUCOSE 95  BUN 19  CREATININE 1.29*  CALCIUM  9.3   Liver Function Tests: No results for input(s): AST, ALT, ALKPHOS, BILITOT, PROT, ALBUMIN in the last 168 hours. CBG: Recent Labs  Lab 02/19/24 2103  GLUCAP 151*    Discharge time spent:  34 minutes.  Signed: Drue ONEIDA Potter, MD [facility] 02/21/2024 "

## 2024-02-21 NOTE — Progress Notes (Signed)
 Reviewed AVS, patient expressed understanding of medications, MD follow up reviewed.   Primary nurse Removed IV, Site clean, dry and intact.  See LDA for information on wounds at discharge. Patient states some belongings brought to the hospital at time of admission are not accounted for and other items are packed to take home.  Patient informed and expressed understanding where to pick up discharge medications.  [facility] contacted to transport patient to entrance A, where family member was waiting in vehicle to transport home.

## 2024-02-21 NOTE — Progress Notes (Signed)
 RN went over dressing change with the patient and he stated understanding. RN will send patient home with supplies.

## 2024-02-21 NOTE — Progress Notes (Signed)
 Physical Therapy Treatment Patient Details Name: Raymond Hurst MRN: [id number] DOB: 03-26-1950 Today's Date: 02/21/2024   History of Present Illness 74 y.o. male admitted to [facility] 01/16/24 with transfer to [facility] ICU on 12/17 with dizziness, hematemesis, blood loss anemia, hemorrhagic shock. EGD 12/17 with duodenal active bleed, clipped; same day emergent ex lap and wound vac placement. S/p abdominal closure 12/19. ETT 12/17-12/23. RUE SVT and DVT noted 12/29. Off TPN 12/31; JP drain removed 12/31. Of note, recent admission 01/06/24-01/08/24 with GIB. Other PMH includes afib on Eliquis , CVA, recurrent GIB, stage IV lung CA (dx 06/2023), gout, previous ETOH abuse.   PT Comments  Pt progressing with mobility. Today's session focused on gait and stair training; pt ambulating with RW and performing standing ADL tasks at supervision-level. Increased time discussing safe d/c plan for home; pt preference for home, owns needed DME and has necessary assist from wife. Pt declines HHPT, reports wife could drive him to OPPT appt. Care team notified of updated d/c recs. If to remain admitted, will continue to follow acutely to address established goals.     If plan is discharge home, recommend the following: A little help with bathing/dressing/bathroom;Assist for transportation;Assistance with cooking/housework   Can travel by private vehicle     Yes  Equipment Recommendations  None recommended by PT    Recommendations for Other Services       Precautions / Restrictions Precautions Precautions: Fall Recall of Precautions/Restrictions: Intact Restrictions Weight Bearing Restrictions Per Provider Order: No     Mobility  Bed Mobility Overal bed mobility: Modified Independent                  Transfers Overall transfer level: Modified independent Equipment used: Rolling walker (2 wheels) Transfers: Sit to/from Stand             General transfer comment: mod indep sit<>stands from EOB and  recliner to RW    Ambulation/Gait Ambulation/Gait assistance: Supervision Gait Distance (Feet): 500 Feet Assistive device: Rolling walker (2 wheels) Gait Pattern/deviations: Step-through pattern, Decreased stride length, Trunk flexed Gait velocity: Decreased     General Gait Details: pt reports preference for RW over rollator; slow, steady gait with RW and supervision for safety; no overt instability or LOB   Stairs Stairs: Yes Stairs assistance: Supervision Stair Management: One rail Right, Two rails, Forwards Number of Stairs: 11 General stair comments: ascend/descend 11 steps with rail support, supervision for safety/lines; step-to pattern; standing rest break at top of stairs before ascending back down secondary to DOE   Wheelchair Mobility     Tilt Bed    Modified Rankin (Stroke Patients Only)       Balance Overall balance assessment: Needs assistance Sitting-balance support: No upper extremity supported, Feet supported Sitting balance-Leahy Scale: Good     Standing balance support: No upper extremity supported, During functional activity Standing balance-Leahy Scale: Fair Standing balance comment: can stand without UE support to perform posterior pericare/hygiene                            Communication Communication Communication: Impaired Factors Affecting Communication: Hearing impaired  Cognition Arousal: Alert Behavior During Therapy: WFL for tasks assessed/performed   PT - Cognitive impairments: No apparent impairments                         Following commands: Intact      Cueing    Exercises  General Comments General comments (skin integrity, edema, etc.): pt reports still having pain R anterior shoulder and decreased sensation R digits, not worse since yesterday, no numbness just tingly; still demonstrates good grip strength and near-full shoulder AROM; encouraged pt to keep tabs on this and reach out to PCP/MD if  sensation or pain worsens. pt still wanting on SNF admit; increased time discussing current functional status and potential for return home; pt in agreement with d/c home, educ re: DME use, fall risk reduction, assist needs, activity recommendations. pt owns necessary DME and will have assist from wife for ADL/iADLs as needed      Pertinent Vitals/Pain Pain Assessment Pain Assessment: Faces Faces Pain Scale: Hurts a little bit Pain Location: R shoulder Pain Descriptors / Indicators: Discomfort Pain Intervention(s): Monitored during session    Home Living                          Prior Function            PT Goals (current goals can now be found in the care plan section) Acute Rehab PT Goals Patient Stated Goal: to improve mobiltiy PT Goal Formulation: With patient Time For Goal Achievement: 03/06/24 Potential to Achieve Goals: Good Progress towards PT goals: Progressing toward goals    Frequency    Min 2X/week      PT Plan      Co-evaluation              AM-PAC PT 6 Clicks Mobility   Outcome Measure  Help needed turning from your back to your side while in a flat bed without using bedrails?: None Help needed moving from lying on your back to sitting on the side of a flat bed without using bedrails?: None Help needed moving to and from a bed to a chair (including a wheelchair)?: None Help needed standing up from a chair using your arms (e.g., wheelchair or bedside chair)?: None Help needed to walk in hospital room?: A Little Help needed climbing 3-5 steps with a railing? : A Little 6 Click Score: 22    End of Session Equipment Utilized During Treatment: Gait belt Activity Tolerance: Patient tolerated treatment well Patient left: in chair;with call bell/phone within reach;with chair alarm set Nurse Communication: Mobility status PT Visit Diagnosis: Unsteadiness on feet (R26.81);Other abnormalities of gait and mobility (R26.89);Muscle weakness  (generalized) (M62.81)     Time: 8682-8658 PT Time Calculation (min) (ACUTE ONLY): 24 min  Charges:    $Gait Training: 8-22 mins $Therapeutic Activity: 8-22 mins PT General Charges $$ ACUTE PT VISIT: 1 Visit                     Darice Almas, PT, DPT Acute Rehabilitation Services  Personal: Secure Chat Rehab Office: [phone]  Darice LITTIE Almas 02/21/2024, 2:59 PM

## 2024-03-04 ENCOUNTER — Other Ambulatory Visit: Payer: Self-pay | Admitting: Oncology

## 2024-03-06 ENCOUNTER — Ambulatory Visit

## 2024-03-06 VITALS — BP 122/80 | HR 91 | Ht 72.0 in | Wt 249.0 lb

## 2024-03-06 DIAGNOSIS — M1990 Unspecified osteoarthritis, unspecified site: Secondary | ICD-10-CM

## 2024-03-06 DIAGNOSIS — C3490 Malignant neoplasm of unspecified part of unspecified bronchus or lung: Secondary | ICD-10-CM

## 2024-03-06 DIAGNOSIS — G8929 Other chronic pain: Secondary | ICD-10-CM | POA: Diagnosis not present

## 2024-03-06 DIAGNOSIS — Z87891 Personal history of nicotine dependence: Secondary | ICD-10-CM

## 2024-03-06 DIAGNOSIS — Z7901 Long term (current) use of anticoagulants: Secondary | ICD-10-CM

## 2024-03-06 DIAGNOSIS — I82609 Acute embolism and thrombosis of unspecified veins of unspecified upper extremity: Secondary | ICD-10-CM

## 2024-03-06 DIAGNOSIS — J90 Pleural effusion, not elsewhere classified: Secondary | ICD-10-CM

## 2024-03-06 DIAGNOSIS — J449 Chronic obstructive pulmonary disease, unspecified: Secondary | ICD-10-CM

## 2024-03-06 DIAGNOSIS — C3492 Malignant neoplasm of unspecified part of left bronchus or lung: Secondary | ICD-10-CM

## 2024-03-06 DIAGNOSIS — K922 Gastrointestinal hemorrhage, unspecified: Secondary | ICD-10-CM | POA: Diagnosis not present

## 2024-03-06 MED ORDER — TRAMADOL HCL 50 MG PO TABS: 50.0000 mg | ORAL_TABLET | Freq: Two times a day (BID) | ORAL | 0 refills | Status: AC | PRN

## 2024-03-06 NOTE — Progress Notes (Signed)
 "   Subjective:   PATIENT ID: Raymond Hurst GENDER: male DOB: 07-10-1950, MRN: [id number]   HPI Discussed the use of AI scribe software for clinical note transcription with the patient, who gave verbal consent to proceed.  History of Present Illness Raymond Hurst is a 74 year old male with stage four lung adenocarcinoma and atrial fibrillation who presents for follow-up regarding his recent hospitalization for a GI bleed and ongoing management of his conditions.  He has a history of stage four lung adenocarcinoma with a recurrent left pleural effusion. He completed chemotherapy with carboplatin  and Taxol , as well as radiation to the chest and adrenal glands. A CT scan from August 26, 2023, showed a left pleural effusion and a left upper lobe mass. He reports that his breathing is good and he continues to use Breztri . He has not used his nebulizer since returning home from the hospital.  He has atrial fibrillation and is on Eliquis . He experienced multiple gastrointestinal bleeds, the most recent of which occurred in December 2025, requiring hospitalization and endoscopy. During this hospitalization, he went into anaphylactic shock from a blood transfusion and developed blood clots in his arm, which he attributes to being off Eliquis  during the bleeding episode. He is currently back on Eliquis .  He has significant weakness and pain in his arm, which he attributes to arthritis and recent blood clots. He is taking Tylenol  for pain and has difficulty with dexterity and numbness in his fingers, impacting his ability to play the bass and sign his name.  He is wearing a urinary catheter due to urinary retention experienced during his recent hospitalization. He has an upcoming appointment with a urologist to address this issue.  He has a history of alcohol use and is experiencing frustration with the lack of follow-up from his primary care provider, particularly regarding physical and occupational therapy  referrals and blood work monitoring. He is seeking a new primary care provider.     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  He had nocturnal hypoxemia with O2 sat nadir of 70%. Now on CPAP   Pulmonary mass    Stroke [facility])      Family History  Problem Relation Age of Onset   Heart failure Mother    COPD Mother    Atrial fibrillation Mother    Idiopathic pulmonary fibrosis Father      Social History   Socioeconomic History   Marital status: Married    Spouse name: Not on file   Number of children: Not on file   Years of education: Not on file   Highest education level: Not on file  Occupational History   Not on file  Tobacco Use   Smoking status: Former    Current packs/day: 0.00    Average packs/day: 2.0 packs/day for 20.0 years (40.0 ttl pk-yrs)    Types: Cigarettes    Start date: 01/31/1970  Quit date: 01/31/1990    Years since quitting: 34.1   Smokeless tobacco: Never  Vaping Use   Vaping status: Never Used  Substance and Sexual Activity   Alcohol use: Not Currently    Comment: 4-5 beer daily   Drug use: No   Sexual activity: Not on file  Other Topics Concern   Not on file  Social History Narrative   Right handed.    Social Drivers of Health   Tobacco Use: Medium Risk (03/06/2024)   Patient History    Smoking Tobacco Use: Former    Smokeless Tobacco Use: Never    Passive Exposure: Not on file  Financial Resource Strain: High Risk (06/15/2023)   Overall Financial Resource Strain (CARDIA)    Difficulty of Paying Living  Expenses: Very hard  Food Insecurity: No Food Insecurity (01/16/2024)   Epic    Worried About Programme Researcher, Broadcasting/film/video in the Last Year: Never true    Ran Out of Food in the Last Year: Never true  Transportation Needs: No Transportation Needs (01/16/2024)   Epic    Lack of Transportation (Medical): No    Lack of Transportation (Non-Medical): No  Physical Activity: Inactive (06/15/2023)   Exercise Vital Sign    Days of Exercise per Week: 0 days    Minutes of Exercise per Session: 0 min  Stress: No Stress Concern Present (06/15/2023)   [employer] of Occupational Health - Occupational Stress Questionnaire    Feeling of Stress : Only a little  Social Connections: Moderately Isolated (01/16/2024)   Social Connection and Isolation Panel    Frequency of Communication with Friends and Family: More than three times a week    Frequency of Social Gatherings with Friends and Family: More than three times a week    Attends Religious Services: Never    Database Administrator or Organizations: No    Attends Banker Meetings: Never    Marital Status: Married  Catering Manager Violence: Not At Risk (01/16/2024)   Epic    Fear of Current or Ex-Partner: No    Emotionally Abused: No    Physically Abused: No    Sexually Abused: No  Depression (PHQ2-9): Low Risk (01/12/2024)   Depression (PHQ2-9)    PHQ-2 Score: 0  Alcohol Screen: Low Risk (06/15/2023)   Alcohol Screen    Last Alcohol Screening Score (AUDIT): 5  Housing: Low Risk (01/26/2024)   Epic    Unable to Pay for Housing in the Last Year: No    Number of Times Moved in the Last Year: 0    Homeless in the Last Year: No  Utilities: Not At Risk (01/16/2024)   Epic    Threatened with loss of utilities: No  Health Literacy: Not on file     Allergies[1]   Outpatient Medications Prior to Visit  Medication Sig Dispense Refill   apixaban  (ELIQUIS ) 5 MG TABS tablet Take 1 tablet (5 mg total) by mouth 2 (two) times daily. Okay to  restart this medication on 08/29/2023     budesonide -glycopyrrolate -formoterol  (BREZTRI  AEROSPHERE) 160-9-4.8 MCG/ACT AERO inhaler Inhale 2 puffs into the lungs in the morning and at bedtime. 3 each 4   cholecalciferol  (VITAMIN D3) 25 MCG (1000 UNIT) tablet Take 1,000 Units by mouth daily.     ipratropium-albuterol  (DUONEB) 0.5-2.5 (3) MG/3ML SOLN Take 3 mLs by nebulization every 6 (six) hours as needed. 360 mL 6   loperamide  (IMODIUM  A-D) 2 MG tablet Take 2-4 mg by mouth 4 (  four) times daily as needed for diarrhea or loose stools.     pantoprazole  (PROTONIX ) 40 MG tablet TAKE 1 TABLET (40 MG TOTAL) BY MOUTH TWICE A DAY BEFORE MEALS 180 tablet 3   thiamine  (VITAMIN B-1) 100 MG tablet Take 100 mg by mouth daily.     allopurinol  (ZYLOPRIM ) 100 MG tablet Take 100 mg by mouth daily. (Patient not taking: Reported on 03/06/2024)     No facility-administered medications prior to visit.    ROS Reviewed all systems and reported negative except as above     Objective:   Vitals:   03/06/24 1526  BP: (!) 148/83  Pulse: 91  SpO2: 100%  Weight: 249 lb (112.9 kg)  Height: 6' (1.829 m)    Physical Exam Physical Exam GENERAL: Appropriate to age, no acute distress. HEAD EYES EARS NOSE THROAT: Moist mucous membranes, atraumatic, normocephalic. CHEST: Clear to auscultation bilaterally, no wheezing, no crackles, no rales. CARDIAC: Regular rate and rhythm, normal S1, normal S2, no murmurs, no rubs, no gallops. ABDOMEN: Soft, nontender. NEUROLOGICAL: Motor and sensation grossly intact, alert and oriented times X 3. EXTREMITIES: Warm, well perfused, no edema.     CBC    Component Value Date/Time   WBC 7.0 02/21/2024 0335   RBC 2.84 (L) 02/21/2024 0335   HGB 8.9 (L) 02/21/2024 0335   HGB 10.2 (L) 01/12/2024 1048   HGB 11.4 (L) 06/01/2023 1439   HCT 27.3 (L) 02/21/2024 0335   HCT 36.9 (L) 06/01/2023 1439   PLT 154 02/21/2024 0335   PLT 225 01/12/2024 1048   PLT 231 06/01/2023 1439   MCV 96.1  02/21/2024 0335   MCV 84 06/01/2023 1439   MCH 31.3 02/21/2024 0335   MCHC 32.6 02/21/2024 0335   RDW 19.0 (H) 02/21/2024 0335   RDW 16.8 (H) 06/01/2023 1439   LYMPHSABS 0.7 02/14/2024 0328   LYMPHSABS 1.0 06/01/2023 1439   MONOABS 0.5 02/14/2024 0328   EOSABS 0.2 02/14/2024 0328   EOSABS 0.2 06/01/2023 1439   BASOSABS 0.0 02/14/2024 0328   BASOSABS 0.0 06/01/2023 1439     Results Radiology Chest CT (08/26/2023): Left pleural effusion and left upper lobe mass (Independently interpreted)  Diagnostic Endoscopy (01/2024): Findings not specified  Pathology Pleural fluid cytology: Negative for malignant cells   PFT:    Latest Ref Rng & Units 04/17/2020   11:56 AM  PFT Results  FVC-Pre L 3.97   FVC-Predicted Pre % 84   FVC-Post L 3.91   FVC-Predicted Post % 83   Pre FEV1/FVC % % 76   Post FEV1/FCV % % 82   FEV1-Pre L 3.03   FEV1-Predicted Pre % 87   FEV1-Post L 3.19   DLCO uncorrected ml/min/mmHg 25.32   DLCO UNC% % 93   DLCO corrected ml/min/mmHg 25.32   DLCO COR %Predicted % 93   DLVA Predicted % 106   TLC L 6.21   TLC % Predicted % 85   RV % Predicted % 94          Assessment & Plan:   Assessment and Plan Assessment & Plan Stage IV lung adenocarcinoma with left pleural effusion Stage IV lung adenocarcinoma with recurrent left pleural effusion. CT scan shows left pleural effusion and left upper lobe mass.  - Continue Eliquis  for blood clot management. - Encouraged follow-up with oncologist for ongoing management and follow-up scans. - Ordered complete blood count and electrolyte panel to monitor overall health status.  Chronic obstructive pulmonary disease (COPD) COPD well-managed with Breztri   and nebulizer treatments. - Continue Breztri  for COPD management. - Use nebulizer as a backup if needed.  Gastrointestinal hemorrhage Recent hospitalization for GI bleed with hematemesis. No current signs of bleeding. Potential immunotherapy side effects  considered. - Monitor for signs of GI bleeding. - Ordered complete blood count to assess hemoglobin levels.  Upper extremity venous thrombosis Recent arm clots likely due to interruption of Eliquis . Currently on Eliquis , expected to manage clots. - Continue Eliquis  for management of blood clots. - Monitor for resolution of clots.  Chronic pain due to arthritis and thrombosis Chronic arm pain from arthritis and thrombosis. Pain management limited to Tylenol  due to Eliquis . Tramadol  prescribed for breakthrough pain. - Prescribed tramadol  for breakthrough pain, with a maximum of two tablets per day. - Continue Tylenol  for pain management. - Encouraged referral to physical and occupational therapy for functional improvement.        Zola Herter, MD [facility] Office: [phone]       [1] No Known Allergies  "

## 2024-03-06 NOTE — Patient Instructions (Signed)
" °  VISIT SUMMARY: During your visit, we discussed the management of your stage IV lung adenocarcinoma, atrial fibrillation, recent gastrointestinal bleed, and chronic pain. We reviewed your recent hospitalization and current medications, and we made adjustments to your treatment plan to better manage your conditions.  YOUR PLAN: -STAGE IV LUNG ADENOCARCINOMA WITH LEFT PLEURAL EFFUSION: Stage IV lung adenocarcinoma is an advanced form of lung cancer that has spread to other parts of the body. You have a recurrent left pleural effusion, which is a buildup of fluid in the space around your lungs. We recommend continuing Eliquis  to manage blood clots and encourage you to follow up with your oncologist for ongoing management and follow-up scans. We have also ordered a complete blood count and electrolyte panel to monitor your overall health status.  -CHRONIC OBSTRUCTIVE PULMONARY DISEASE (COPD): COPD is a chronic inflammatory lung disease that causes obstructed airflow from the lungs. Your COPD is well-managed with Breztri , and you should continue using it. Use your nebulizer as a backup if needed.  -GASTROINTESTINAL HEMORRHAGE: A gastrointestinal hemorrhage is bleeding within the digestive tract. You were recently hospitalized for a GI bleed, but there are no current signs of bleeding. We will monitor for any signs of GI bleeding and have ordered a complete blood count to assess your hemoglobin levels.  -UPPER EXTREMITY VENOUS THROMBOSIS: Upper extremity venous thrombosis is the formation of blood clots in the veins of the arms. Your recent arm clots were likely due to the interruption of Eliquis . You should continue taking Eliquis  to manage the blood clots, and we will monitor for their resolution.  -CHRONIC PAIN DUE TO ARTHRITIS AND THROMBOSIS: Chronic pain can result from arthritis and thrombosis. You have significant arm pain, and we have prescribed tramadol  for breakthrough pain, with a maximum of two  tablets per day. Continue taking Tylenol  for pain management. We also encourage you to seek physical and occupational therapy for functional improvement.  INSTRUCTIONS: Please follow up with your oncologist for ongoing management of your lung cancer and follow-up scans. Continue taking Eliquis  as prescribed and monitor for any signs of GI bleeding. Use Breztri  for COPD management and your nebulizer as needed. Take tramadol  for breakthrough pain, with a maximum of two tablets per day, and continue Tylenol  for pain management. We have ordered a complete blood count and electrolyte panel to monitor your overall health status. Additionally, please attend your upcoming appointment with the urologist to address your urinary retention issue.    Contains text generated by [employer].   "

## 2024-03-07 ENCOUNTER — Encounter: Payer: Self-pay | Admitting: Oncology

## 2024-03-07 ENCOUNTER — Inpatient Hospital Stay

## 2024-03-07 ENCOUNTER — Telehealth: Payer: Self-pay | Admitting: Oncology

## 2024-03-07 ENCOUNTER — Inpatient Hospital Stay: Admitting: Oncology

## 2024-03-07 ENCOUNTER — Inpatient Hospital Stay: Attending: Nurse Practitioner

## 2024-03-07 LAB — BASIC METABOLIC PANEL WITH GFR
BUN: 12 mg/dL (ref 6–23)
CO2: 27 meq/L (ref 19–32)
Calcium: 9 mg/dL (ref 8.4–10.5)
Chloride: 107 meq/L (ref 96–112)
Creatinine, Ser: 1.06 mg/dL (ref 0.40–1.50)
GFR: 70.34 mL/min
Glucose, Bld: 78 mg/dL (ref 70–99)
Potassium: 4.2 meq/L (ref 3.5–5.1)
Sodium: 140 meq/L (ref 135–145)

## 2024-03-07 LAB — CBC WITH DIFFERENTIAL/PLATELET
Basophils Absolute: 0.1 10*3/uL (ref 0.0–0.1)
Basophils Relative: 1.4 % (ref 0.0–3.0)
Eosinophils Absolute: 0.1 10*3/uL (ref 0.0–0.7)
Eosinophils Relative: 2.4 % (ref 0.0–5.0)
HCT: 30 % — ABNORMAL LOW (ref 39.0–52.0)
Hemoglobin: 10.2 g/dL — ABNORMAL LOW (ref 13.0–17.0)
Lymphocytes Relative: 9.5 % — ABNORMAL LOW (ref 12.0–46.0)
Lymphs Abs: 0.5 10*3/uL — ABNORMAL LOW (ref 0.7–4.0)
MCHC: 33.9 g/dL (ref 30.0–36.0)
MCV: 95.5 fl (ref 78.0–100.0)
Monocytes Absolute: 0.3 10*3/uL (ref 0.1–1.0)
Monocytes Relative: 6.7 % (ref 3.0–12.0)
Neutro Abs: 4.1 10*3/uL (ref 1.4–7.7)
Neutrophils Relative %: 80 % — ABNORMAL HIGH (ref 43.0–77.0)
Platelets: 224 10*3/uL (ref 150.0–400.0)
RBC: 3.14 Mil/uL — ABNORMAL LOW (ref 4.22–5.81)
RDW: 18.5 % — ABNORMAL HIGH (ref 11.5–15.5)
WBC: 5.1 10*3/uL (ref 4.0–10.5)

## 2024-03-07 NOTE — Telephone Encounter (Signed)
 Pt called and said he would not be able to come in today. His truck is stuck in his driveway. He said he will call back later to reschedule.

## 2024-04-04 ENCOUNTER — Inpatient Hospital Stay

## 2024-04-04 ENCOUNTER — Inpatient Hospital Stay: Admitting: Nurse Practitioner
